# Patient Record
Sex: Male | Born: 1970
Health system: Southern US, Community
[De-identification: ages and names within clinical notes are randomized; demographics above are authoritative.]

## PROBLEM LIST (undated history)

## (undated) DIAGNOSIS — F419 Anxiety disorder, unspecified: Secondary | ICD-10-CM

## (undated) DIAGNOSIS — I493 Ventricular premature depolarization: Secondary | ICD-10-CM

## (undated) DIAGNOSIS — G8220 Paraplegia, unspecified: Secondary | ICD-10-CM

## (undated) DIAGNOSIS — T8859XA Other complications of anesthesia, initial encounter: Secondary | ICD-10-CM

## (undated) DIAGNOSIS — M869 Osteomyelitis, unspecified: Secondary | ICD-10-CM

## (undated) DIAGNOSIS — A419 Sepsis, unspecified organism: Secondary | ICD-10-CM

## (undated) DIAGNOSIS — R7881 Bacteremia: Secondary | ICD-10-CM

## (undated) DIAGNOSIS — I10 Essential (primary) hypertension: Secondary | ICD-10-CM

## (undated) DIAGNOSIS — Z978 Presence of other specified devices: Secondary | ICD-10-CM

## (undated) DIAGNOSIS — I059 Rheumatic mitral valve disease, unspecified: Secondary | ICD-10-CM

## (undated) DIAGNOSIS — I330 Acute and subacute infective endocarditis: Secondary | ICD-10-CM

## (undated) DIAGNOSIS — Z993 Dependence on wheelchair: Secondary | ICD-10-CM

## (undated) DIAGNOSIS — I499 Cardiac arrhythmia, unspecified: Secondary | ICD-10-CM

## (undated) DIAGNOSIS — R6521 Severe sepsis with septic shock: Secondary | ICD-10-CM

## (undated) DIAGNOSIS — Z960 Presence of urogenital implants: Secondary | ICD-10-CM

## (undated) HISTORY — PX: HIP SURGERY: SHX245

## (undated) HISTORY — DX: Dependence on wheelchair: Z99.3

## (undated) HISTORY — DX: Presence of urogenital implants: Z96.0

## (undated) HISTORY — DX: Presence of other specified devices: Z97.8

## (undated) HISTORY — DX: Ventricular premature depolarization: I49.3

## (undated) HISTORY — PX: BACK SURGERY: SHX140

---

## 1997-09-14 ENCOUNTER — Emergency Department (HOSPITAL_COMMUNITY): Admission: EM | Admit: 1997-09-14 | Discharge: 1997-09-14 | Payer: Self-pay | Admitting: Emergency Medicine

## 1998-10-03 ENCOUNTER — Encounter: Admission: RE | Admit: 1998-10-03 | Discharge: 1998-10-03 | Payer: Self-pay | Admitting: Family Medicine

## 1998-10-17 ENCOUNTER — Encounter: Admission: RE | Admit: 1998-10-17 | Discharge: 1998-10-17 | Payer: Self-pay | Admitting: Family Medicine

## 1999-03-10 ENCOUNTER — Emergency Department (HOSPITAL_COMMUNITY): Admission: EM | Admit: 1999-03-10 | Discharge: 1999-03-10 | Payer: Self-pay | Admitting: Emergency Medicine

## 1999-03-10 ENCOUNTER — Encounter: Payer: Self-pay | Admitting: Emergency Medicine

## 1999-03-12 ENCOUNTER — Encounter: Admission: RE | Admit: 1999-03-12 | Discharge: 1999-03-12 | Payer: Self-pay | Admitting: Sports Medicine

## 1999-04-14 ENCOUNTER — Emergency Department (HOSPITAL_COMMUNITY): Admission: EM | Admit: 1999-04-14 | Discharge: 1999-04-14 | Payer: Self-pay | Admitting: Emergency Medicine

## 2000-04-20 ENCOUNTER — Encounter: Admission: RE | Admit: 2000-04-20 | Discharge: 2000-04-20 | Payer: Self-pay | Admitting: Family Medicine

## 2002-09-27 ENCOUNTER — Emergency Department (HOSPITAL_COMMUNITY): Admission: EM | Admit: 2002-09-27 | Discharge: 2002-09-27 | Payer: Self-pay | Admitting: Emergency Medicine

## 2002-10-03 ENCOUNTER — Encounter: Admission: RE | Admit: 2002-10-03 | Discharge: 2002-10-03 | Payer: Self-pay | Admitting: Sports Medicine

## 2002-10-03 ENCOUNTER — Encounter: Payer: Self-pay | Admitting: Sports Medicine

## 2002-10-03 ENCOUNTER — Encounter: Admission: RE | Admit: 2002-10-03 | Discharge: 2002-10-03 | Payer: Self-pay | Admitting: Family Medicine

## 2003-09-04 ENCOUNTER — Inpatient Hospital Stay (HOSPITAL_COMMUNITY): Admission: AD | Admit: 2003-09-04 | Discharge: 2003-09-11 | Payer: Self-pay | Admitting: Family Medicine

## 2003-09-04 ENCOUNTER — Emergency Department (HOSPITAL_COMMUNITY): Admission: EM | Admit: 2003-09-04 | Discharge: 2003-09-04 | Payer: Self-pay | Admitting: Family Medicine

## 2003-10-31 ENCOUNTER — Ambulatory Visit (HOSPITAL_BASED_OUTPATIENT_CLINIC_OR_DEPARTMENT_OTHER): Admission: RE | Admit: 2003-10-31 | Discharge: 2003-10-31 | Payer: Self-pay | Admitting: Orthopedic Surgery

## 2004-08-23 ENCOUNTER — Emergency Department (HOSPITAL_COMMUNITY): Admission: EM | Admit: 2004-08-23 | Discharge: 2004-08-23 | Payer: Self-pay | Admitting: Family Medicine

## 2005-03-08 ENCOUNTER — Ambulatory Visit: Payer: Self-pay | Admitting: Family Medicine

## 2005-03-08 ENCOUNTER — Inpatient Hospital Stay (HOSPITAL_COMMUNITY): Admission: EM | Admit: 2005-03-08 | Discharge: 2005-03-13 | Payer: Self-pay | Admitting: Family Medicine

## 2005-03-14 ENCOUNTER — Ambulatory Visit (HOSPITAL_COMMUNITY): Admission: RE | Admit: 2005-03-14 | Discharge: 2005-03-15 | Payer: Self-pay | Admitting: Urology

## 2005-03-27 ENCOUNTER — Ambulatory Visit: Payer: Self-pay | Admitting: Family Medicine

## 2005-04-03 ENCOUNTER — Ambulatory Visit: Payer: Self-pay | Admitting: Family Medicine

## 2005-05-02 ENCOUNTER — Emergency Department (HOSPITAL_COMMUNITY): Admission: EM | Admit: 2005-05-02 | Discharge: 2005-05-03 | Payer: Self-pay | Admitting: Emergency Medicine

## 2005-11-29 ENCOUNTER — Emergency Department (HOSPITAL_COMMUNITY): Admission: EM | Admit: 2005-11-29 | Discharge: 2005-11-29 | Payer: Self-pay | Admitting: Emergency Medicine

## 2006-03-19 DIAGNOSIS — L8944 Pressure ulcer of contiguous site of back, buttock and hip, stage 4: Secondary | ICD-10-CM | POA: Insufficient documentation

## 2006-03-19 DIAGNOSIS — F528 Other sexual dysfunction not due to a substance or known physiological condition: Secondary | ICD-10-CM

## 2006-03-19 DIAGNOSIS — L8990 Pressure ulcer of unspecified site, unspecified stage: Secondary | ICD-10-CM

## 2006-03-19 DIAGNOSIS — I872 Venous insufficiency (chronic) (peripheral): Secondary | ICD-10-CM | POA: Insufficient documentation

## 2006-03-19 DIAGNOSIS — F419 Anxiety disorder, unspecified: Secondary | ICD-10-CM | POA: Insufficient documentation

## 2006-03-19 DIAGNOSIS — F411 Generalized anxiety disorder: Secondary | ICD-10-CM

## 2007-02-11 ENCOUNTER — Encounter (INDEPENDENT_AMBULATORY_CARE_PROVIDER_SITE_OTHER): Payer: Self-pay | Admitting: *Deleted

## 2007-05-08 ENCOUNTER — Emergency Department (HOSPITAL_COMMUNITY): Admission: EM | Admit: 2007-05-08 | Discharge: 2007-05-08 | Payer: Self-pay | Admitting: Emergency Medicine

## 2007-06-01 ENCOUNTER — Ambulatory Visit (HOSPITAL_COMMUNITY): Admission: RE | Admit: 2007-06-01 | Discharge: 2007-06-01 | Payer: Self-pay | Admitting: Family Medicine

## 2007-06-01 ENCOUNTER — Ambulatory Visit: Payer: Self-pay | Admitting: Family Medicine

## 2007-06-01 ENCOUNTER — Encounter (INDEPENDENT_AMBULATORY_CARE_PROVIDER_SITE_OTHER): Payer: Self-pay | Admitting: *Deleted

## 2007-06-01 DIAGNOSIS — R0602 Shortness of breath: Secondary | ICD-10-CM

## 2007-06-01 DIAGNOSIS — I10 Essential (primary) hypertension: Secondary | ICD-10-CM

## 2007-06-01 DIAGNOSIS — J309 Allergic rhinitis, unspecified: Secondary | ICD-10-CM | POA: Insufficient documentation

## 2007-06-01 LAB — CONVERTED CEMR LAB
AST: 10 units/L (ref 0–37)
Alkaline Phosphatase: 70 units/L (ref 39–117)
BUN: 11 mg/dL (ref 6–23)
Chloride: 107 meq/L (ref 96–112)
HCT: 39.4 % (ref 39.0–52.0)
LDL Cholesterol: 89 mg/dL (ref 0–99)
MCV: 84.7 fL (ref 78.0–100.0)
Potassium: 3.9 meq/L (ref 3.5–5.3)
Sodium: 145 meq/L (ref 135–145)
TSH: 0.704 microintl units/mL (ref 0.350–5.50)
Total Bilirubin: 0.4 mg/dL (ref 0.3–1.2)
Total Protein: 8 g/dL (ref 6.0–8.3)
Triglycerides: 134 mg/dL (ref <150)
VLDL: 27 mg/dL (ref 0–40)
WBC: 6.9 10*3/uL (ref 4.0–10.5)

## 2007-06-03 ENCOUNTER — Encounter (INDEPENDENT_AMBULATORY_CARE_PROVIDER_SITE_OTHER): Payer: Self-pay | Admitting: *Deleted

## 2007-06-04 ENCOUNTER — Encounter (INDEPENDENT_AMBULATORY_CARE_PROVIDER_SITE_OTHER): Payer: Self-pay | Admitting: *Deleted

## 2007-06-23 ENCOUNTER — Encounter (INDEPENDENT_AMBULATORY_CARE_PROVIDER_SITE_OTHER): Payer: Self-pay | Admitting: *Deleted

## 2007-06-30 ENCOUNTER — Ambulatory Visit: Payer: Self-pay | Admitting: Family Medicine

## 2007-06-30 ENCOUNTER — Encounter (INDEPENDENT_AMBULATORY_CARE_PROVIDER_SITE_OTHER): Payer: Self-pay | Admitting: *Deleted

## 2007-06-30 DIAGNOSIS — J45909 Unspecified asthma, uncomplicated: Secondary | ICD-10-CM | POA: Insufficient documentation

## 2007-06-30 LAB — CONVERTED CEMR LAB
BUN: 10 mg/dL (ref 6–23)
CO2: 26 meq/L (ref 19–32)
Calcium: 10.1 mg/dL (ref 8.4–10.5)
Chloride: 99 meq/L (ref 96–112)
Sodium: 141 meq/L (ref 135–145)

## 2007-07-01 ENCOUNTER — Encounter (INDEPENDENT_AMBULATORY_CARE_PROVIDER_SITE_OTHER): Payer: Self-pay | Admitting: *Deleted

## 2009-09-18 ENCOUNTER — Encounter: Payer: Self-pay | Admitting: Family Medicine

## 2010-02-19 NOTE — Miscellaneous (Signed)
Summary: Asthma QI    

## 2010-06-07 NOTE — Discharge Summary (Signed)
NAME:  Anthony [name], Anthony [name]                          ACCOUNT NO.:  [id number]   MEDICAL RECORD NO.:  [id number]                   PATIENT TYPE:  INP   LOCATION:  5743                                 FACILITY:  [facility]   PHYSICIAN:  Santiago Bumpers. Hensel, M.D.             DATE OF BIRTH:  Jun 25, 1970   DATE OF ADMISSION:  09/04/2003  DATE OF DISCHARGE:  09/11/2003                                 DISCHARGE SUMMARY   ADMITTING DIAGNOSES:  Osteomyelitis of the fourth and fifth right foot toes  with necrotic fifth toe.   DISCHARGE DIAGNOSES:  Right foot fourth and fifth toe osteomyelitis.   CLINICAL COURSE:  A 40 year old man with history of spina bifida and  paraplegia secondary to the same.  Reported noticing that his right small  toe turned black this weekend.  Patient has numerous traumatic injuries to  bilateral feet and toes including having his toes cut in his wheelchair.  He  has no numerous foot sores, especially between his toes in the past year.  On physical examination on the right foot necrotic fifth digit actually  hanging off and a stage 3 decubitus ulcer anterior to his lateral malleolus  was observed.  On his left foot there was necrosis of his fifth digit which  appeared to arise between the fourth and fifth digit.   LABORATORY DATA:  On the foot x-ray diagnosis of osteomyelitis of the  phalanges of the fifth toe and osteomyelitis of the fourth toe on the left  foot.  White blood cells 5.9, HB 10.9, hematocrit 32.4, platelets 432.  Differential:  N 64%, L 21%, M 12%, E 2%, B 1%.  BMP:  Na 138, K 3.3, Cl  103, CO2 28, BUN 6, CR 0.7, GLU 89.  Blood cultures were pending.  Because  of history of methicillin-resistant Staphylococcus aureus and his  sensitivity to vancomycin, patient was covered with Ancef and Doxycycline  for skin and for MRSA organisms.  A consult with orthopedics and a consult  with wound care was arranged.  Dr. Sherlean Foot, orthopedics, was consulted and  osteomyelitis  with necrosis of the fifth toe right foot was diagnosed.  Patient was scheduled for surgery on August 17.  According with Dr. Sherlean Foot,  the diagnosis was right fourth and fifth toe with osteomyelitis.   PROCEDURE:  Right fourth and fifth toe amputation.  A vascular consult was  requested for ABI status.  __________  indicate wet to dry dressings t.i.d.  Previous antibiotics were discontinued and Vancomycin and Rocephin were  added.  Wound healing was appropriate with good granulation tissue and no  purulent secretions.  According when Dr. Valere Dross was added to the treatment  plan for the wound on the right foot.  Lower extremity arterial evaluation  results:  ABIs and __________ wave forms were normal bilaterally.  According  to orthopedics, continue VAC as treatment for wounds.  Plans for [facility] wound  VAC Prafo bilaterally to protect feet.  Patient will follow up at [facility]  Ortho with Dr. [name].  IV antibiotics were discontinued on August 21 and  Augmentin 875 mg b.i.d. was added to the treatment.  Patient's right foot is  healing properly and on August 22 discharge home is decided.   PROCEDURES:  1. Lower extremity arterial evaluation.  2. Right and left foot x-rays.   CONSULTS:  1. Orthopedics, Dr. Sherlean Foot  2. Vascular.   Treatment as inpatient vancomycin 1 mg IV daily.  Given four days.  Rocephin  given three days.  This medication was discontinued and patient received  Augmentin 875 mg p.o. b.i.d. given two days.   DISCHARGE MEDICATIONS:  Augmentin 875 mg b.i.d. for three weeks.   WOUND CARE:  [facility] will visit the patient on Mondays,  Wednesdays, and Fridays.  Social worker in [facility] will try to  fix transportation for follow-up appointments with Dr. [name].   FOLLOWUP:  Follow-up appointment with Dr. [name] at [address]  and this is the [facility].  This will appear in the addendum.  __________ appointment is still pending.   Follow-up appointment with Dr.  Macon Large at [facility] still pending.  I will put this day  and date in the addendum.      Henri Medal, MD                   Santiago Bumpers Leveda Anna, M.D.    Lendon Colonel  D:  09/11/2003  T:  09/12/2003  Job:  [id number]

## 2010-06-07 NOTE — H&P (Signed)
NAME:  Anthony [name], Anthony [name] NO.:  [id number]   MEDICAL RECORD NO.:  [id number]          PATIENT TYPE:  OBV   LOCATION:  5033                         FACILITY:  [facility]   PHYSICIAN:  Leighton Roach McDiarmid, M.D.DATE OF BIRTH:  1971/01/10   DATE OF ADMISSION:  03/07/2005  DATE OF DISCHARGE:                                HISTORY & PHYSICAL   CHIEF COMPLAINT:  Foot infection and urine problems.   HISTORY OF PRESENT ILLNESS:  Mr. Rhines is a 40 year old white male with  paraplegia secondary to spina bifida who presented to the ED with a 1-2 week  history of a swollen right foot.  The foot has been swollen and erythematous  since he removed his cast about 1-2 weeks ago.  It has become increasingly  edematous and fluctuant since that time.  The patient also had noticed a  pocket of fluid on his perineum that has been there for about one month that  causes him to expel urine when he pushes on it.  The patient also denies any  burning or urinary discomfort.  To urinate, the patient needs to push  externally on his bladder.  The patient denies any fevers, but he has had  chills over the past week or two.  He also has had multiple ulcers on his  foot that have been there for many years.  He denies nausea or vomiting,  cough, chest pain, shortness of breath.   PAST MEDICAL HISTORY:  1.  Spina bifida with resulting paraplegia.  2.  Wolfe-Parkinson-White syndrome.  3.  History of decubitus ulcers.  4.  History of amputation of his right lateral two toes after a MRSA      infection in 2005.  5.  History of hypertension.   MEDICATIONS:  None.   ALLERGIES:  VANCOMYCIN causes severe itching.   PAST SURGICAL HISTORY:  The patient has had multiple operations on his lower  extremities to straighten them out.  These operations were done when he was  a child.   FAMILY HISTORY:  The patient's mother is alive, she has diabetes and lung  cancer.  His father is deceased and had an MI.  He has  a healthy sister.   SOCIAL HISTORY:  He lives alone in an apartment.  He is on disability.  He  gets Medicare.  He has smoked off and on for 15 years, most recently has  quit for the past 2-3 months.  When he was smoking, he smoked 1/2 to a pack  a day.  He has occasional alcohol, about 2-3 times a month when with  friends.   PHYSICAL EXAMINATION:  VITAL SIGNS:  Temperature 98.8, blood pressure 158/95, pulse 128 to 140,  respirations 22, oxygen 97-100% on room air.  GENERAL:  The patient is an alert and oriented, very pleasant male.  HEENT:  Head is normocephalic, atraumatic, pupils equal, round, reactive to  light and accommodation, moist mucous membranes, oropharynx clear without  erythema or exudate.  LUNGS:  Clear to auscultation bilaterally.  HEART:  Tachycardic, no murmurs, gallops, and rubs, regular rhythm.  ABDOMEN:  Normal active bowel sounds, soft, nontender, nondistended.  GU:  No penile ulcerations, circumcised male.  RECTAL:  The patient is trace heme positive, no fissures.  In the perineal  area, there is a pocket of swelling that is about 2 by 2 cm.  When pushed,  he expels urine.  EXTREMITIES:  The patient has mild contractures.  His right dorsal foot has  a scabbed ulcer that is surrounded by an area of erythema, warmth, and  edema, that extends almost up to his knee.  SKIN:  He has multiple healing ulcers bilaterally on both feet.  NEUROLOGICAL:  The patient does not have any sensation or motor control  below his waist.  He uses a wheelchair.  Cranial nerves grossly intact.  Normal strength, tone, and sensation in the upper extremities.   LABORATORY DATA:  UA had large leukocyte esterase, large nitrites, greater  than 300 protein, large hemoglobin, few bacteria, too numerous to count  white blood cells.  White blood cell count 12.3, hemoglobin 11, hematocrit  33.6, platelets 628 with 87% neutrophils.  Hemoglobin trace positive.  Sodium 138, potassium 3.9, chloride  105, bicarb 10, BUN 8, creatinine 0.9,  glucose 101.  AST 14, ALT 14, alkaline phos 78, bilirubin 0.7, albumin 2.7.  Wound culture pending.  Urine culture pending.   ASSESSMENT AND PLAN:  This is a 40 year old male with spina bifida here with  a right lower extremity cellulitis and a possible uroma.   Problem 1:  Cellulitis is likely secondary to venous stasis.  We will start  him on Bactrim 2 tablets b.i.d. to cover MRSA and other common skin  pathogens.  We will check blood cultures.  Currently, there are no signs of  systemic infection.  Problem 2:  Bacteruria.  The patient is asymptomatic but he does have a  urogenic bladder.  We will culture the urine and the Bactrim should cover E.  coli.  Problem 3:  Uroma.  We will consult urology for recommendation.  Problem 4:  Wound care.  We will have him to place Urea skin to his skin  lesions.  The patient may need a wound consult if available.  Problem 5:  Tachycardia.  We will get an EKG, we suspect this may be related  to his Wolfe-Parkinson-White or possibly infection or a combination of the  two.  Problem 6:  Fluids, electrolytes, and nutrition.  We will give him fluids  gently at half normal saline at 75 mL an hour and a regular diet.      Altamese Cabal, M.D.    ______________________________  Leighton Roach McDiarmid, M.D.    KS/MEDQ  D:  03/07/2005  T:  03/08/2005  Job:  [id number]

## 2010-06-07 NOTE — Discharge Summary (Signed)
NAME:  Anthony [name], Anthony [name]                          ACCOUNT NO.:  [id number]   MEDICAL RECORD NO.:  [id number]                   PATIENT TYPE:  INP   LOCATION:  5743                                 FACILITY:  [facility]   PHYSICIAN:  Henri Medal, MD             DATE OF BIRTH:  September 21, 1970   DATE OF ADMISSION:  09/04/2003  DATE OF DISCHARGE:  09/11/2003                                 DISCHARGE SUMMARY   ADDENDUM TO DISCHARGE SUMMARY:   FOLLOWUP:  1. The patient has a follow up appointment with Dr. Anastasio Auerbach on August     30 at 1:30 p.m.  2. The patient has an appointment with Dr. [name] at [facility] and     the center is going to call the patient to set the appointment for next     week.                                                Henri Medal, MD    FIM/MEDQ  D:  09/11/2003  T:  09/12/2003  Job:  [id number]   cc:   Anastasio Auerbach, MD  Fax: [phone]   [name], M.D.  Fax: [phone]

## 2010-06-07 NOTE — Op Note (Signed)
NAMELAVANTE, TOSO NO.:  [id number]   MEDICAL RECORD NO.:  [id number]          PATIENT TYPE:  INP   LOCATION:  5743                         FACILITY:  [facility]   PHYSICIAN:  Mila Homer. Sherlean Foot, M.D. DATE OF BIRTH:  November 25, 1970   DATE OF PROCEDURE:  09/06/2003  DATE OF DISCHARGE:  09/11/2003                                 OPERATIVE REPORT   SURGEON:  Mila Homer. Sherlean Foot, M.D.   ASSISTANT:  None.   ANESTHESIA:  General.   PREOPERATIVE DIAGNOSIS:  Osteomyelitis of the fourth and fifth toes.   POSTOPERATIVE DIAGNOSIS:  Osteomyelitis of the fourth and fifth toes.   PROCEDURE:  Right fourth and fifth ray amputations.   INDICATION FOR PROCEDURE:  The patient is a 40 year old spina bifida patient  I was consulted on, September 05, 2003, with a diagnosis of osteomyelitis,  confirmed by MRI scan.  Informed consent was obtained.   DESCRIPTION OF PROCEDURE:  The patient was laid supine and administered  regional block anesthesia.  The right lower extremity was prepped and draped  in the usual sterile fashion.  A #10 blade was used to make a ray  dissection, removing all necrotic tissue on the lateral border of the foot  and extending up, incorporating the lateral 2 toes.  The 2 toes were removed  sharply.  I then used a micro E saw to make a chamfer cut in the metatarsals  buried deep in the soft tissue.  I then irrigated with 6000 mL of saline and  dressed with a Kerlix moist and then a dry Kerlix, and then a Quincy Simmonds  dressing.  There was no tourniquet.  Hemostasis was easily obtained, since  the blood flow was compromised.   COMPLICATIONS:  None.   DRAINS:  None.       SDL/MEDQ  D:  10/30/2003  T:  10/31/2003  Job:  [id number]

## 2010-06-07 NOTE — Consult Note (Signed)
Anthony [name], Anthony [name]                ACCOUNT NO.:  [id number]   MEDICAL RECORD NO.:  [id number]          PATIENT TYPE:  OBV   LOCATION:  5033                         FACILITY:  [facility]   PHYSICIAN:  Lindaann Slough, M.D.  DATE OF BIRTH:  [birth year]   DATE OF CONSULTATION:  03/08/2005  DATE OF DISCHARGE:                                   CONSULTATION   REASON FOR CONSULTATION:  Swelling perineal area.   HISTORY OF PRESENT ILLNESS:  The patient is a 40 year old male paraplegic  secondary to spina bifida. He came to the emergency room last night with 1  to 2 weeks history of swelling of the right foot. He also has some swelling  in the perineal area that he noticed about a month ago and when he pushes on  that swelling, it drains to his penis. He does not have any discomfort and  he does not have any pain. He has urgency but he is able to void on his own.  He has no hematuria.   PAST MEDICAL HISTORY:  Positive for Wolff-Parkinson-White syndrome. He also  has a history of spina bifida.   PAST SURGICAL HISTORY:  He had hip surgery, back surgery, and surgery for  decubitus.   ALLERGIES:  VANCOMYCIN.   SOCIAL HISTORY:  He is single. He is not sexually active. He does not have  any erections. He does not smoke and drinks occasionally.   FAMILY HISTORY:  His father died in 76 of heart disease and there is a  family history of heart disease on his father side. His mother had breast  cancer and there is a maternal family history of diabetes. He has 1 sister.   REVIEW OF SYSTEMS:  He complains of swelling of the perineal area and  swelling of the right foot and he has no difficulty moving his bowels and he  has no nausea and vomiting. He has no cough and he has no other symptoms.   PHYSICAL EXAMINATION:  GENERAL:  This is a 40 year old paraplegic who is  oriented to person, place, and time.  VITAL SIGNS:  Blood pressure 112/68, temperature 101.2 last night and is  98.5 this morning.  Pulse 105. Respiratory rate 20.  SKIN:  Warm and dry.  HEENT:  He has pink conjunctivae.  NECK:  No cervical adenopathy. No thyromegaly.  LUNGS:  Clear.  HEART:  Regular rhythm.  ABDOMEN:  Soft, nontender, and nondistended. No CVA tenderness. No  hepatosplenomegaly.  No inguinal hernia.  GENITOURINARY:  Penis is uncircumcised. Meatus is normal. Scrotum is normal.  He has no hydrocele and no testicular mass. There is a fluctuant non-tender  area of the perineum. On pressing on that area, urine comes out of the  urethra.  RECTUM:  He has no external hemorrhoids and sphincter tone is normal. There  is no rectal mass and he has stools in ampulla.  EXTREMITIES:  He has mild contractures of both feet and he has healing  ulcers on both feet.   LABORATORY DATA:  Urinalysis shows too numerous to count white blood cells  and 21  to 50 red blood cells with pH of 6.30 and urine is nitrite positive.  Hemoglobin is 9.3, hematocrit 27.9. White blood cell count 9.6. Sodium 135,  potassium 3.5, BUN 7, creatinine 0.8.   IMPRESSION:  1.  Possible urethral diverticulum.  2.  Paraplegia.  3.  Spina bifida.  4.  Contractures lower extremities.   SUGGESTIONS:  Retrograde urethrogram to rule out urethral diverticulum.  Further evaluation and management depends on the results of the urethrogram.      Lindaann Slough, M.D.  Electronically Signed     MN/MEDQ  D:  03/08/2005  T:  03/08/2005  Job:  [id number]

## 2010-06-07 NOTE — H&P (Signed)
NAME:  Anthony [name], Anthony [name]                          ACCOUNT NO.:  [id number]   MEDICAL RECORD NO.:  [id number]                   PATIENT TYPE:  INP   LOCATION:  5727                                 FACILITY:  [facility]   PHYSICIAN:  Santiago Bumpers. Hensel, M.D.             DATE OF BIRTH:  1970-01-22   DATE OF ADMISSION:  09/04/2003  DATE OF DISCHARGE:                                HISTORY & PHYSICAL   PRIMARY CARE PHYSICIAN:  Dr. Anastasio Auerbach   CHIEF COMPLAINT:  Total necrosis of foot ulcers.   HISTORY OF PRESENT ILLNESS:  The patient is a 40 year old man with a history  of spina bifida and paraplegia secondary to same.  He reports noticing his  right small toe turning black this weekend.  He has had numerous traumatic  injuries to bilateral feet and toes, including having his toes caught in his  wheelchair, also a plastic bag which became wrapped around his small toe and  pulling it back.  He has had numerous foot sores especially between his toes  in the past year.   REVIEW OF SYSTEMS:  CONSTITUTIONAL:  Positive for a temperature to 100.3  with no chills.  CARDIOVASCULAR:  No palpitations.  RESPIRATORY:  No  shortness of breath.  GI:  No nausea, vomiting, diarrhea, constipation.  SKIN:  As in HPI, skin  ulcers on bilateral feet.  PSYCHE:  Positive for anxiety.  NEUROLOGIC:  Positive for paraplegia.  GU:  No dysuria or other urinary symptoms.  HEMATOLOGY:  No easy bruising or  bleeding.   PAST MEDICAL HISTORY:  1. Spina bifida with paraplegia secondary to same.  2. Decubitus ulcers.  3. History of methicillin resistant Staphylococcus aureus.  4. Wolfe-Parkinson White syndrome.   MEDICATIONS:  None.   ALLERGIES:  He reports having a severe pruritic reaction to either  ERYTHROMYCIN OR VANCOMYCIN, he is unsure of which.   PROCEDURE:  1. History of multiple hip surgeries as a child.  2. Left lower extremity surgery on a tendon.  3. Multiple surgeries for decubitus ulcers.   SOCIAL  HISTORY:  He lives alone in an apartment.  Quit smoking in October of  2004, however he does partake in an occasional cigarette, i.e.,  approximately 1 every couple of months.  He does drink alcohol approximately  3-4 drinks every 2 weeks, and denies illicit drugs.  He is on Social  Security disability.   PHYSICAL EXAMINATION:  VITAL SIGNS:  Temperature 99.4, pulse 119,  respirations 17, blood pressure 155/105.  GENERAL:  This is an unkempt white male in no acute distress.  HEENT:  Pupils are equal, round and reactive to light.  Extraocular  movements are intact.  Oropharynx is without erythema or exudate but he does have poor dentition  noted.  NECK:  No thyromegaly, no lymphadenopathy.  LUNGS:  Clear to auscultation bilaterally with good air movement.  CARDIOVASCULAR:  He is mildly tachycardic  without murmur.  He has 2+ distal  pulses in bilateral feet.  EXTREMITIES:  He does have gross edema of the right foot and 1+ pitting  edema to mid tibia.  He has some edema of his left foot but no tibial edema  noted.  ABDOMEN:  Soft, nontender, nondistended with normal active bowel sounds.  SKIN:  His right foot has a necrotic fifth digit which is actually hanging  off, and a stage 3 decubitus ulcer just anterior to his lateral malleolus.  His left foot has necrosis of the fifth digit which appears to arise from  between the fourth and fifth digit.  NEUROLOGIC:  Cranial nerves II-XII are grossly intact.  Strength is 5/5  bilaterally. He has 2+ deep tendon reflexes in upper extremities and  diminished DTRs in his lower extremities secondary to paraplegia.  </LABORATORY DATA/TESTS>  He has foot x-rays, a CBC with differential, and blood cultures pending.   ASSESSMENT/PLAN:  1. Multiple foot ulcers and necrotic toes, this seems likely secondary to     multiple traumatic events.  We will check blood cultures and CBC and     obtain x-rays to rule out osteomyelitis.  Will also consult  orthopedics     in the morning to perform what likely will need to be an amputation.     Also concerned for cellulitis, he does has some erythema up his right     leg.  Therefore I will start antibiotics.  Because of his history of     methicillin resistant Staphylococcus aureus and his sensitivity to     vancomycin, we will cover with Ancef and Doxycycline for skin and for     MRSA organisms.  We will also obtain a wound care consult and arrange for     [facility].   1. Social.  The patient lives alone with what sounds like few services     available to him.  We will consult Care Management to assist with this     planning.   1. Paraplegia secondary to spina bifida.  Will consider a PT consult on     patient's medical condition and stabilize.   1. Anxiety.  Will monitor for signs and symptoms but work up as an     outpatient seems more appropriate for this gentleman.      Ursula Beath, MD                     Santiago Bumpers. Leveda Anna, M.D.    JT/MEDQ  D:  09/04/2003  T:  09/05/2003  Job:  [id number]

## 2010-06-07 NOTE — Op Note (Signed)
NAMEWAYMOND, MEADOR                ACCOUNT NO.:  [id number]   MEDICAL RECORD NO.:  [id number]          PATIENT TYPE:  INP   LOCATION:  5033                         FACILITY:  [facility]   PHYSICIAN:  Lindaann Slough, M.D.  DATE OF BIRTH:  [birth year]   DATE OF PROCEDURE:  03/11/2005  DATE OF DISCHARGE:                                 OPERATIVE REPORT   PREOPERATIVE DIAGNOSIS:  Urethral diverticulum.   POSTOPERATIVE DIAGNOSIS:  Urethral diverticulum.   OPERATION PERFORMED:  Cystoscopy.   SURGEON:  Danae Chen, M.D.   ANESTHESIA:   INDICATIONS FOR PROCEDURE:  The patient is a 40 years old male paraplegic  secondary to spina bifida.  He had noticed a swelling in the perineal area  and when he squeezes that area, urine comes out of the urethra.  Retrograde  urethrogram showed extravasation of contrast extending towards the hip.  He  is scheduled today for cystoscopy.   DESCRIPTION OF PROCEDURE:  After instillation of 2% Xylocaine jelly in the  urethra, a flexible cystoscope was passed in the bladder.  There is a wide  mouth diverticulum on the floor of the bulbous urethra and the rest of the  urethra is normal.  The bladder mucosa is normal.  There is no stone or  tumor in the bladder.  The ureteral orifices are in normal position and  shape.  The cystoscope was removed.   The patient tolerated the procedure well.      Lindaann Slough, M.D.  Electronically Signed     MN/MEDQ  D:  03/11/2005  T:  03/11/2005  Job:  [id number]

## 2010-06-07 NOTE — Discharge Summary (Signed)
NAMEKENRICK, PORE NO.:  [id number]   MEDICAL RECORD NO.:  [id number]          PATIENT TYPE:  INP   LOCATION:  5033                         FACILITY:  [facility]   PHYSICIAN:  Pearlean Brownie, M.D.DATE OF BIRTH:  1970-05-05   DATE OF ADMISSION:  03/07/2005  DATE OF DISCHARGE:  03/13/2005                                 DISCHARGE SUMMARY   DISCHARGE DIAGNOSES:  1.  Cellulitis.  2.  Urinary tract infection.  3.  Urethral diverticulum.  4.  Anemia.  5.  Anxiety disorder, not otherwise specified.  6.  Tinea pedis.   PROCEDURES:  1.  Cystoscopy on March 11, 2005 which demonstrated no urethral      strictures but a false passage in the bulbous urethra.  2.  Retrograde urethrogram which showed a large urethral      diverticulum/fistula extending into the left buttocks posterior to the      left hip as well as a small amount of vesicourethral reflux into the      distal right ureter.  3.  Arterial Dopplers of the lower extremities which were normal.   LABORATORY DATA ON ADMISSION:  UA had large blood, greater than 300 protein,  positive nitrites, and large leukocytes.  CBC:  White count 12.3, hemoglobin  11.1, hematocrit 33.6, MCV 74.8, platelets 629.  Sodium 138, potassium 3.9,  chloride 105, bicarb 28, glucose 101, BUN 8, creatinine 0.9.  Total  bilirubin 0.7, alkaline phosphatase 78, AST 14, ALT 14, albumin 2.7, total  protein 7.8.  Fecal occult blood-positive.   DISCHARGE MEDICATIONS:  1.  Bactrim DS p.o. b.i.d. x7 days.  2.  Lamisil 250 mg p.o. daily x8 days.  3.  Ambien 10 mg p.o. q.h.s. p.r.n.  4.  Urea cream 4%, apply b.i.d. x2 weeks.  5.  BuSpar 10 mg p.o. b.i.d.  6.  Multivitamin p.o. daily.  7.  Klonopin 0.5 mg t.i.d.   HISTORY OF PRESENT ILLNESS:  Mr. Pherigo is a 40 year old male with paraplegia  secondary to spina bifida who presented to the ED with a one to two-week  history of a swollen right foot.  He had multiple ulcers on both of his  feet.  He also had the complaint that he had an area of swelling on his  perineal area that when depressed he would extravasate urine from his penis.  The patient was admitted for treatment of cellulitis and treatment of his  urinary problem.   HOSPITAL COURSE BY PROBLEM:  Problem 1.  Cellulitis.  The patient was  started on IV clindamycin and Septra and began to respond immediately.  The  culture was Staph aureus and found to be sensitive to Bactrim, so he was  switched over to this on hospital day #3 and discharged home on this for a  total of 10 days of treatment.  He remained afebrile for the rest of his  hospitalization, and his white count trended down.   Problem 2.  Urinary tract infection.  The patient had an E coli infection  that was also sensitive to Bactrim.  He will be  treated for a total of 10  days.   Problem 3.  Anemia.  The patient had a microcytic anemia that was likely a  mixed picture of chronic disease and iron deficiency.  This will need to be  followed up on as an outpatient since he was fecal occult blood-positive.  He was started on a multivitamin while he was here since his folate was low.  He hemoglobin remained stable but above 10 throughout his hospitalization,  so no further intervention was necessary.   Problem 4.  Urethral diverticulum.  This was diagnosed by urology consult  who did a cystoscopy and retrograde urethrogram.  The patient was scheduled  to have surgery on the day after discharge at [facility].   Problem 5.  Anxiety.  The patient had a lot of anxiety here in the hospital,  but it also seems to have an issue for him at home.  He was started on  Klonopin as well as BuSpar and tolerated both of these well, and they seemed  to help.  His BuSpar can be titrated up as an outpatient.   Problem 6.  Tinea pedis.  The patient had thick plaques on his feet  bilaterally.  We gave him an oral antifungal as well as cream to use on his   feet.   Problem 7.  Physical therapy.  The patient needed a Roho cushion for his  wheelchair.  We were able to obtain this for him while he was here in the  hospital.  He was also fitted for a new wheelchair and will be getting one  through Medicare.   LABORATORY DATA ON DISCHARGE:  Blood cultures negative.  TSH 1.093.  Ferritin 220.  White count 8.5, hemoglobin 10.7, hematocrit 32.7, platelets  639.  Wound culture with Staph aureus.  Folate 3.4.  Vitamin B12 329.  Total  iron 35.   DISCHARGE INSTRUCTIONS:  1.  He is to take all of his medications as prescribed.  2.  He is to follow up at [facility] for urologic surgery.  3.  He is to follow up with Dr. Iven Finn on April 08, 2005.      Altamese Cabal, M.D.    ______________________________  Pearlean Brownie, M.D.    KS/MEDQ  D:  03/16/2005  T:  03/17/2005  Job:  [id number]

## 2010-06-07 NOTE — Op Note (Signed)
Anthony [name], Anthony [name]                ACCOUNT NO.:  [id number]   MEDICAL RECORD NO.:  [id number]          PATIENT TYPE:  OIB   LOCATION:  1410                         FACILITY:  [facility]   PHYSICIAN:  Lindaann Slough, M.D.  DATE OF BIRTH:  Jun 05, 1970   DATE OF PROCEDURE:  03/14/2005  DATE OF DISCHARGE:                                 OPERATIVE REPORT   PREOPERATIVE DIAGNOSIS:  Urethral diverticulum.   POSTOPERATIVE DIAGNOSIS:  Urethral diverticulum.   PROCEDURE:  Cystoscopy, cystogram and insertion of suprapubic catheter.   SURGEON:  Dr. Brunilda Payor and Dr. Sherron Monday.   ANESTHESIA:  General.   INDICATION:  The patient is a 40 year old male who was seen in consultation  about a week ago for a 1 month history of swelling of the perineum. When he  squeezes that swelling,  urine comes out of the urethra.  Retrograde  urethrogram showed a urethral diverticulum with extravasation of contrast  extending to the left hip. Cystoscopy showed no urethral stricture and a  diverticulum in the bulbous urethra. The patient is scheduled today for  cystoscopy under anesthesia, retrograde urethrogram, cystogram and  suprapubic cystostomy.   DESCRIPTION OF PROCEDURE:  Under general anesthesia, the patient was prepped  and draped and placed in the dorsal lithotomy position. A #22 Wappler  cystoscope was inserted in the urethra. There is no evidence of urethral  stricture but there is a wide-mouth diverticulum on the floor of the bulbous  urethra. The cystoscope was then passed through the prostatic urethra and  there is a diverticulum also in the prostatic urethra. The cystoscope was  passed in the bladder. There is no stone or tumor in the bladder. The right  ureteral orifice is of a horse shoe type. The left ureteral orifice is of  stadium shape.  Then a guidewire was passed through the cystoscope into the  bladder and the cystoscope was removed. A #18-French Councill tip catheter  was passed over the  guidewire and the guidewire was removed. A cystogram was  then done. The contours of the bladder are normal. There is no evidence of  bladder diverticulum. Then the Councill tip catheter was removed. The  bladder neck is competent. There is no evidence of drainage through the  bladder neck. The catheter was then removed. The cystoscope was reinserted  in the bladder and the bladder was then filled with normal saline and the  bladder capacity is about 700 mL. Then a spinal needle was passed through  the suprapubic area at about 2 inches above the symphysis pubis into the  bladder to verify the position of the bladder. Then a 1 cm transverse skin  incision was made in the suprapubic area about 2 inches above the symphysis  pubis and a Rush suprapubic catheter introducer was passed through that  incision into the bladder and a #16-French Foley catheter was passed through  the introducer into the bladder. The position of the Foley catheter in the  bladder was verified by the cystoscope. The balloon of the Foley catheter  was then inflated with 10 mL of normal saline and the  cystoscope was  removed. The catheter was then secured to the skin with #2-0 silk.   The patient tolerated the procedure well and left the OR in satisfactory  condition to post anesthesia care unit.   It is important to note that Dr. Sherron Monday  feels that it is not possible  to fix the urethral diverticulum and the patient needs urinary diversion  with suprapubic cystostomy.      Lindaann Slough, M.D.  Electronically Signed     MN/MEDQ  D:  03/14/2005  T:  03/15/2005  Job:  [id number]

## 2010-10-30 ENCOUNTER — Ambulatory Visit: Payer: Self-pay | Admitting: Family Medicine

## 2010-10-31 ENCOUNTER — Telehealth: Payer: Self-pay | Admitting: *Deleted

## 2010-10-31 NOTE — Telephone Encounter (Signed)
Patient scheduled for an appointment yesterday to re-establish care at [facility].  Missed his NP appointment and did not cancel 24 hours in advance.  Per policy is not allowed to reschedule.

## 2010-12-26 ENCOUNTER — Ambulatory Visit (INDEPENDENT_AMBULATORY_CARE_PROVIDER_SITE_OTHER): Payer: Self-pay | Admitting: Family Medicine

## 2010-12-26 ENCOUNTER — Encounter: Payer: Self-pay | Admitting: Family Medicine

## 2010-12-26 VITALS — BP 140/88 | HR 103 | Temp 99.0°F

## 2010-12-26 DIAGNOSIS — I10 Essential (primary) hypertension: Secondary | ICD-10-CM

## 2010-12-26 LAB — BASIC METABOLIC PANEL
Calcium: 9.3 mg/dL (ref 8.4–10.5)
Sodium: 144 mEq/L (ref 135–145)

## 2010-12-26 MED ORDER — LISINOPRIL-HYDROCHLOROTHIAZIDE 20-12.5 MG PO TABS: 1.0000 | ORAL_TABLET | Freq: Every day | ORAL | Status: DC

## 2010-12-26 NOTE — Progress Notes (Signed)
  Subjective:    Patient ID: Anthony [name], male    DOB: September 21, 1970, 40 y.o.   MRN: [id number]  HPI Pt has not been seen in several years.  Has not had BP med in at least 1 year.  Now he is having stress related HA and he would like to reestablish care.  These HA will go away quickly with advil.  No vision change with HA but does note that if he applies pressure to his eye, his vision will blur.  He has not been seen by an eye doctor for this.  Right now, he feels his vision is fine.     Review of Systems No CP, SOB or leg edema     Objective:   Physical Exam Vital signs reviewed General appearance - alert, well appearing, and in no distress and oriented to person, place, and time Heart - normal rate, regular rhythm, normal S1, S2, no murmurs, rubs, clicks or gallops Chest - clear to auscultation, no wheezes, rales or rhonchi, symmetric air entry, no tachypnea, retractions or cyanosis Abdomen - soft, nontender, nondistended, no masses or organomegaly Chronic indwelling catheter Wheelchair bound       Assessment & Plan:

## 2010-12-26 NOTE — Patient Instructions (Signed)
I am checking your blood work today and I would like you to come back for a nurse visit and lab check in about 1 week Please come back and see me in one mont for blood pressure follow up

## 2010-12-26 NOTE — Assessment & Plan Note (Signed)
Repeat BP better.  Restart meds.  Check BMET today and have pt follow up for BMET and RN BP check in 1 week

## 2011-01-06 ENCOUNTER — Other Ambulatory Visit: Payer: Self-pay

## 2011-01-08 ENCOUNTER — Ambulatory Visit (INDEPENDENT_AMBULATORY_CARE_PROVIDER_SITE_OTHER): Payer: Self-pay | Admitting: *Deleted

## 2011-01-08 ENCOUNTER — Encounter: Payer: Self-pay | Admitting: Family Medicine

## 2011-01-08 ENCOUNTER — Other Ambulatory Visit: Payer: Self-pay

## 2011-01-08 ENCOUNTER — Other Ambulatory Visit: Payer: Self-pay | Admitting: Family Medicine

## 2011-01-08 VITALS — BP 150/110 | HR 120

## 2011-01-08 DIAGNOSIS — I10 Essential (primary) hypertension: Secondary | ICD-10-CM

## 2011-01-08 LAB — BASIC METABOLIC PANEL
Chloride: 102 mEq/L (ref 96–112)
Creat: 0.62 mg/dL (ref 0.50–1.35)
Glucose, Bld: 92 mg/dL (ref 70–99)
Potassium: 4.2 mEq/L (ref 3.5–5.3)

## 2011-01-08 MED ORDER — LISINOPRIL-HYDROCHLOROTHIAZIDE 20-12.5 MG PO TABS: 2.0000 | ORAL_TABLET | Freq: Every day | ORAL | Status: DC

## 2011-01-08 NOTE — Progress Notes (Signed)
Bmp done today Anthony [name] 

## 2011-01-08 NOTE — Progress Notes (Signed)
Patient in earlier today for labs and BP check. BP checked manually using regular adult cuff.  BP LA 160/108 and RA 150/110 pulse 120. Patient states he is taking BP medication as recently prescribed.  He also brings in his BP monitor and is able to provide history on machine  of BP readings. dystolic BP consistently  anywhere from 90 to 118. Consulted with Dr. Jennette Kettle and she advises for patient to  double up on Llisinopril /HCTZ 20/12.5and follow up with MD in 2 weeks. Appointment scheduled .

## 2011-01-08 NOTE — Progress Notes (Signed)
Patient ID: Anthony [name], male   DOB: 22-Jul-1970, 40 y.o.   MRN: [id number] BP check ON 160/108 and review of BP at home similar w some more elevation diastolic. Will dbl his lisinoretic and he will f/u PCp 2-3 weeks. He had labs drawn today.

## 2011-01-27 ENCOUNTER — Ambulatory Visit (INDEPENDENT_AMBULATORY_CARE_PROVIDER_SITE_OTHER): Payer: Medicare Other | Admitting: Family Medicine

## 2011-01-27 ENCOUNTER — Encounter: Payer: Self-pay | Admitting: Family Medicine

## 2011-01-27 DIAGNOSIS — M25519 Pain in unspecified shoulder: Secondary | ICD-10-CM

## 2011-01-27 DIAGNOSIS — M25511 Pain in right shoulder: Secondary | ICD-10-CM | POA: Insufficient documentation

## 2011-01-27 DIAGNOSIS — I10 Essential (primary) hypertension: Secondary | ICD-10-CM | POA: Diagnosis not present

## 2011-01-27 DIAGNOSIS — M25512 Pain in left shoulder: Secondary | ICD-10-CM | POA: Insufficient documentation

## 2011-01-27 MED ORDER — LISINOPRIL-HYDROCHLOROTHIAZIDE 20-12.5 MG PO TABS: 2.0000 | ORAL_TABLET | Freq: Every day | ORAL | Status: DC

## 2011-01-27 MED ORDER — LISINOPRIL-HYDROCHLOROTHIAZIDE 20-25 MG PO TABS: 1.0000 | ORAL_TABLET | Freq: Every day | ORAL | Status: DC

## 2011-01-27 NOTE — Assessment & Plan Note (Signed)
Bilateral shoulder pain left greater than right. I think this may be related to either overuse or arthritis. Uses his shoulders a lot moving his wheelchair. Advised Tylenol for pain. Patient to return if worsened and wants physical therapy or further evaluation.

## 2011-01-27 NOTE — Assessment & Plan Note (Signed)
Blood pressure in range today. Continue current management.

## 2011-01-27 NOTE — Patient Instructions (Addendum)
Pick up the script for lisinopril/HCTZ at 20/12.5 and take 2 pills.  They should give you 60 pills.    Take your blood pressure at home Call if consistently it is over 140/90

## 2011-01-27 NOTE — Progress Notes (Signed)
  Subjective:    Patient ID: Anthony [name], male    DOB: 1970/06/01, 41 y.o.   MRN: [id number]  HPI Hypertension-patient's blood pressure at home has consistently been in the 120s over 70s. He denies any chest pain or shortness of breath. He denies any headaches. He states that initially when he started the medication he had some diarrhea but now it is improved. He states he will occasionally have a feeling of fatigue the last about 20 minutes and is associated with no other symptoms. It seems to come and go without warning. It usually happens a few times a week. He states that he does not have any residual weakness after this happens.  Shoulder pain-patient has shoulder pain bilaterally left greater than right. This is worse at the end of the day. It feels like an ache. He will usually take some Motrin for this and it will make it go away pretty quickly. It is not bad enough that he wants any physical therapy.   Review of Systems Denies CP, SOB, HA, N/V/D, fever     Objective:   Physical Exam  Vital signs reviewed General appearance - alert, well appearing, and in no distress and oriented to person, place, and time Heart - normal rate, regular rhythm, normal S1, S2, no murmurs, rubs, clicks or gallops Chest - clear to auscultation, no wheezes, rales or rhonchi, symmetric air entry, no tachypnea, retractions or cyanosis Shoulders-bilaterally with full range of motion, mild tenderness with raising shoulder above head on left. Crepitus bilaterally      Assessment & Plan:

## 2011-04-03 ENCOUNTER — Ambulatory Visit (INDEPENDENT_AMBULATORY_CARE_PROVIDER_SITE_OTHER): Payer: Medicare Other | Admitting: Family Medicine

## 2011-04-03 ENCOUNTER — Encounter: Payer: Self-pay | Admitting: Family Medicine

## 2011-04-03 VITALS — BP 112/78 | HR 122 | Temp 98.9°F

## 2011-04-03 DIAGNOSIS — I10 Essential (primary) hypertension: Secondary | ICD-10-CM

## 2011-04-03 NOTE — Progress Notes (Signed)
  Subjective:    Patient ID: Anthony [name], male    DOB: 1970/12/13, 41 y.o.   MRN: [id number]  HPI Patient presented today for followup of hypertension. He has been tracking his blood pressures 3 times a day at home. He brings his numbers in today. His numbers are between 140 and 170 systolic and 90 and 110 diastolic. He will occasionally get headaches and his blood pressure goes up to 170. He is taking one pill of lisinopril HCTZ. He denies chest pain or shortness of breath.   Review of Systems See above    Objective:   Physical Exam Vital signs reviewed General appearance - alert, well appearing, and in no distress and oriented to person, place, and time Heart - normal rate, regular rhythm, normal S1, S2, no murmurs, rubs, clicks or gallops Chest - clear to auscultation, no wheezes, rales or rhonchi, symmetric air entry, no tachypnea, retractions or cyanosis        Assessment & Plan:

## 2011-04-03 NOTE — Patient Instructions (Signed)
Please continue your current medication. Please bring in your blood pressure cuff to next appointment We will compare our readings next your readings. If your next in office blood pressure is good we will leave your medicine alone If your next in office blood pressure is bad, we will increase your medicine

## 2011-04-03 NOTE — Assessment & Plan Note (Signed)
BP Readings from Last 3 Encounters:  04/03/11 112/78  01/27/11 110/73  01/08/11 150/110   Patient was last several blood pressures normal. However, his readings at home involving very elevated. I need to figure out which ones are more accurate. If he is truly getting elevated at home, we'll need to increase his medications. He'll come back on Monday for reevaluation. He will bring his cuff and we will check them side by side at that time. He may need to be evaluated with ambulatory home blood pressure monitoring if his cuff is not accurate.

## 2011-04-07 ENCOUNTER — Encounter: Payer: Self-pay | Admitting: Family Medicine

## 2011-04-07 ENCOUNTER — Ambulatory Visit (INDEPENDENT_AMBULATORY_CARE_PROVIDER_SITE_OTHER): Payer: Medicare Other | Admitting: Family Medicine

## 2011-04-07 VITALS — BP 132/80 | HR 109 | Temp 99.0°F

## 2011-04-07 DIAGNOSIS — I10 Essential (primary) hypertension: Secondary | ICD-10-CM

## 2011-04-07 NOTE — Assessment & Plan Note (Signed)
Together we discussed his home tracking. He will not use his wrist monitor anymore. If she desires to get a home monitor, it will be for the upper arm. At that time, he will come in and have it checked with our machine. He will come in for 3 months once a month for a blood pressure check to make sure that it stays stable. After that he will come in every 3-6 months.

## 2011-04-07 NOTE — Patient Instructions (Signed)
Thank you for coming in today Please don't use your wrist cuff anymore You can get a arm cuff that goes around your upper arm if you would like Until you get 1, let's have you come in for nurse visit once a month for 3 months to make sure your blood pressure stays normal When you get one, please bring it in and we will check it against our machine as well

## 2011-04-07 NOTE — Progress Notes (Signed)
  Subjective:    Patient ID: Anthony [name], male    DOB: 1970/05/06, 41 y.o.   MRN: [id number]  HPI  Patient here today to followup on his blood pressure. He is brought in his wrist cuff. On exam today, his blood pressure is 132/80 in the left arm. In his left wrist on his machine it is 155/103. He denies chest pain, shortness of breath. He is taking his medications. He is not dizzy.  Review of Systems See above    Objective:   Physical Exam  Vital signs reviewed General appearance - alert, well appearing, and in no distress and oriented to person, place, and time       Assessment & Plan:

## 2011-12-08 ENCOUNTER — Telehealth: Payer: Self-pay | Admitting: Family Medicine

## 2011-12-08 NOTE — Telephone Encounter (Signed)
Is asking for a Roho cushion (air cushion) for his wheelchair - needs to go thru [facility]

## 2011-12-09 NOTE — Telephone Encounter (Signed)
I can take care of this but need some advice on how to put in an order for [facility] for a patient in outpatient setting.

## 2011-12-11 ENCOUNTER — Other Ambulatory Visit: Payer: Self-pay | Admitting: Family Medicine

## 2011-12-11 DIAGNOSIS — L8990 Pressure ulcer of unspecified site, unspecified stage: Secondary | ICD-10-CM

## 2011-12-11 NOTE — Assessment & Plan Note (Signed)
Ordering cushion. Will schedule follow up appointment.

## 2011-12-11 NOTE — Telephone Encounter (Signed)
Ordered cushion and will fax order to [facility].  Would like to see patient in clinic.  Please set up appointment. Thanks!

## 2011-12-11 NOTE — Progress Notes (Signed)
Patient with history of chronic venous insufficiency and decubitus ulcer requesting Roho cushion (air cushion) for his wheelchair through [facility].  Placing order (will fax to [facility]) and asking team to help schedule appointment to follow up with me.  Simone Curia 12/11/2011 10:04 AM

## 2011-12-12 NOTE — Telephone Encounter (Signed)
Dorene Grebe called back and I gave her the information needed to get the cushion.  Called and informed pt, attempted to get the pt an appt but he said he would call back to scheudle. Tiki Tucciarone, Maryjo Rochester

## 2011-12-12 NOTE — Telephone Encounter (Signed)
12/11/2011 4:26 PM Darci Needle [facility] Patient Calls Comment: [facility] called and said if patient doesn't have a decubitus on his buttocks or trunk - pt states that he only has a sore between his legs. Medicare will not pay for this cushion. Not sure what to do now. Of note: The above message is from yesterday.  I have called natalie @ [phone] ext [phone] and LMOVM for her to give me a call back.  Will inform her of decubitus ulcer on buttocks. When she returns call. Fleeger, Maryjo Rochester

## 2011-12-12 NOTE — Telephone Encounter (Signed)
Pt states that his sore is not on his leg, it is between the crack of his buttocks - very painful and needs the cushion asap.  pls let [facility] know to send it out - medicare will pay for it if the ulcer is on his trunk or buttocks

## 2012-02-23 ENCOUNTER — Other Ambulatory Visit: Payer: Self-pay | Admitting: *Deleted

## 2012-02-23 MED ORDER — LISINOPRIL-HYDROCHLOROTHIAZIDE 20-12.5 MG PO TABS: 1.0000 | ORAL_TABLET | Freq: Every day | ORAL | Status: DC

## 2012-02-23 NOTE — Telephone Encounter (Signed)
Patient needs appt with MD prior to more refills.

## 2012-02-26 ENCOUNTER — Telehealth: Payer: Self-pay | Admitting: Family Medicine

## 2012-02-26 DIAGNOSIS — I10 Essential (primary) hypertension: Secondary | ICD-10-CM

## 2012-02-26 NOTE — Telephone Encounter (Signed)
Patient states wrong

## 2012-02-26 NOTE — Telephone Encounter (Signed)
Spoke with patient and he states he has been on lisinopril 20/25 for the past year. Marland Kitchen Spoke with pharmacist and he confirmed that this is what he last received. Consulted Dr. Deirdre Priest . Chart was reviewed. He advises to send in lisinopril HCTZ 20/25.  Patient needs appointment for follow up with PCP. Message left on voicemail.

## 2012-02-26 NOTE — Telephone Encounter (Signed)
Patient states the wrong dosage for his BP meds was sent to pharmacy. Please call patient as soon as possible due the the patient being at the pharmacy and present moment.

## 2012-02-27 MED ORDER — LISINOPRIL-HYDROCHLOROTHIAZIDE 20-25 MG PO TABS: 1.0000 | ORAL_TABLET | Freq: Every day | ORAL | Status: DC

## 2012-03-10 ENCOUNTER — Ambulatory Visit: Payer: Medicare Other | Admitting: Family Medicine

## 2012-03-22 ENCOUNTER — Ambulatory Visit: Payer: Medicare Other | Admitting: Family Medicine

## 2012-03-25 ENCOUNTER — Ambulatory Visit (INDEPENDENT_AMBULATORY_CARE_PROVIDER_SITE_OTHER): Payer: Medicare Other | Admitting: Family Medicine

## 2012-03-25 VITALS — BP 145/85 | HR 110 | Temp 99.2°F

## 2012-03-25 DIAGNOSIS — Z0000 Encounter for general adult medical examination without abnormal findings: Secondary | ICD-10-CM | POA: Diagnosis not present

## 2012-03-25 DIAGNOSIS — I10 Essential (primary) hypertension: Secondary | ICD-10-CM | POA: Diagnosis not present

## 2012-03-25 LAB — BASIC METABOLIC PANEL
BUN: 20 mg/dL (ref 6–23)
Creat: 0.6 mg/dL (ref 0.50–1.35)
Glucose, Bld: 95 mg/dL (ref 70–99)
Potassium: 3.8 mEq/L (ref 3.5–5.3)
Sodium: 137 mEq/L (ref 135–145)

## 2012-03-25 MED ORDER — LISINOPRIL-HYDROCHLOROTHIAZIDE 20-25 MG PO TABS: 1.0000 | ORAL_TABLET | Freq: Every day | ORAL | Status: DC

## 2012-03-25 NOTE — Progress Notes (Signed)
Subjective:     Patient ID: Anthony [name], male   DOB: [birth year], 42 y.o.   MRN: [id number]  CC - F/u HTN  HPI  Anthony [name] is a 42 y.o. male with h/o HTN, anxiety, asthma, and a pressure ulcer here to f/u his HTN.   1. HTN - Pt takes lisinopril-HCTZ 20-25mg  daily as prescribed and reports no HA or vision changes, with BP over the past many visits being well-controlled. He does not check BP at home due to cuff previously being very off. Needs refill today.  2. Pressure ulcers - Pt has chronic urethral open ulcer that he monitors daily and follows up with [facility] for. No signs of infection and pt well-aware of changing indwelling catheter and changes it once monthly. Denies fever/chills currently.  Review of Systems - Per HPI     Objective:   Physical Exam BP 145/85  Pulse 110  Temp(Src) 99.2 F (37.3 C) (Oral)  BP on recheck 140/90 GEN: NAD, sitting in wheelchair, pleasant PULM: Normal effort    Assessment:     Anthony [name] is a 42 y.o. male with h/o HTN, anxiety, asthma, and a pressure ulcer here to f/u his HTN.      Plan:     # Health maintenance -  - Pt refused flu shot and TDAP today  - f/u in 6 months, fasting lipid panel at that time

## 2012-03-25 NOTE — Patient Instructions (Addendum)
It was good to meet you today.  We are getting a basic metabolic panel today. I will call you if any results are abnormal; otherwise I'll send you a letter if everything looks fine. For your blood pressure, please keep taking your medication daily. I will call your pharmacy to have them stop mixing up the medications. On recheck, it was 136/82.  Please make a follow-up appt with me in 6 months. You can always come back sooner if you have any concerns. See if you can get an AM appt for a fasting lipid panel.  If you'd like to calibrate your BP cuff to ours, feel free to bring it in. Stay safe in this weather.

## 2012-03-26 ENCOUNTER — Encounter: Payer: Self-pay | Admitting: Family Medicine

## 2012-03-26 DIAGNOSIS — Z0000 Encounter for general adult medical examination without abnormal findings: Secondary | ICD-10-CM | POA: Insufficient documentation

## 2012-03-26 NOTE — Assessment & Plan Note (Addendum)
Well-controlled on lisinopril-HCTZ 20-25mg  daily.  - Re-prescribed x 6 months - BMET today WNL (Cr 0.6, K 3.8) - will send letter with results - F/u in 6 months - Reminded pt he can bring his BP cuff in to re-calibrate but he no longer has one - Will call pharmacy to make sure they delete the 20-12.5 dose that pt states they keep trying to dispense to him

## 2012-09-30 ENCOUNTER — Encounter: Payer: Self-pay | Admitting: Family Medicine

## 2012-09-30 ENCOUNTER — Ambulatory Visit (INDEPENDENT_AMBULATORY_CARE_PROVIDER_SITE_OTHER): Payer: Medicare Other | Admitting: Family Medicine

## 2012-09-30 VITALS — BP 137/80 | HR 80 | Temp 99.2°F | Wt 175.0 lb

## 2012-09-30 DIAGNOSIS — L8990 Pressure ulcer of unspecified site, unspecified stage: Secondary | ICD-10-CM

## 2012-09-30 DIAGNOSIS — Q057 Lumbar spina bifida without hydrocephalus: Secondary | ICD-10-CM | POA: Insufficient documentation

## 2012-09-30 DIAGNOSIS — Q059 Spina bifida, unspecified: Secondary | ICD-10-CM | POA: Insufficient documentation

## 2012-09-30 DIAGNOSIS — I10 Essential (primary) hypertension: Secondary | ICD-10-CM

## 2012-09-30 MED ORDER — LISINOPRIL-HYDROCHLOROTHIAZIDE 20-25 MG PO TABS: 1.0000 | ORAL_TABLET | Freq: Every day | ORAL | Status: DC

## 2012-09-30 NOTE — Progress Notes (Signed)
Patient ID: Anthony [name], male   DOB: Dec 08, 1970, 42 y.o.   MRN: [id number] Subjective:   CC: Follow-up HTN  HPI:   1. HTN: Patient takes lisinopril-HCTZ daily with no side effects of angioedema or cough or changes to urine. Denies headaches, blurred vision, dizziness, syncope, chest pain, or shortness of breath. Exercise consists of rolling himself in wheelchar 30-60 minutes 2x/week (on nice days, to nearby shopping center). Does not check BP at home due to cuff breaking.  2. Anxiety: Doing okay, especially helps to get out in wheelchair.  3. Spina bifida with pressure ulcer: Seeing urologist as needed, checks self regularly, no complaints/burning/pain/breakdown currently. Uses suprapubic catheter also with no signs of infection from that. Denies chills/fevers. Is wheelchair-bound but would like to have brace/crutches for occasional 1-2 times annually when he would like it to be easier for him to get in and out of family members' houses for holidays.  4. Health maintenance: Declines STD check, flu shot, TDAP, or pneumococcal shot today. Needs refill on BP medication today. Denies any unanticipated weight changes.   Review of Systems - Per HPI. Denies palpitations or chest pain (h/o WPW)  SH: - No tobacco or drug use. 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 Avoids caffeine.  Objective:  Physical Exam BP 137/80  Pulse 80  Temp(Src) 99.2 F (37.3 C) (Oral)  Wt 175 lb (79.379 kg) GEN: NAD, pleasant, wheelchair-bound HEENT: Atraumatic, normocephalic, neck supple, EOMI, sclera clear  CV: RRR, no murmurs, rubs, or gallops PULM: CTAB, normal effort SKIN: No rash or cyanosis; warm and well-perfused, dry skin at legs EXTR: No lower extremity edema or calf tenderness; legs are atrophied PSYCH: Mood and affect euthymic, normal rate and volume of speech NEURO: Awake, alert, normal speech, normal upper extremity strength and ROM;  wheelchair-bound with no gross sensation or strength lower extremities GU: Deferred due to reporting no issues  Assessment:     Anthony [name] is a 42 y.o. male with h/o spina bifida and HTN here for follow-up.    Plan:     # See problem list for problem-specific plans. - Declined flu, TDAP, and pneumococcal immunizations - Return in 1 year for health maintenance visit

## 2012-09-30 NOTE — Patient Instructions (Addendum)
It was good to see you today.  Blood pressure: Continue taking your blood pressure medicine. If you would like to bring your next cuff in for calibration you can make a nurse appointment. Continue doing a great job with exercise, especially on pretty days. I am giving you information about the DASH diet which is good for blood pressure control. I am refilling meds x 1 year  For your pressure ulcer: Keep examining your ulcer and the suprapubic catheter and if it gets signs of infection come in right away.  If you change your mind about flu shot, tetanus shot, or pneumonia shot, make a nurse visit appt.  I will start looking into getting crutches/brace and call or send you a letter with what I find out.   DASH Diet The DASH diet stands for "Dietary Approaches to Stop Hypertension." It is a healthy eating plan that has been shown to reduce high blood pressure (hypertension) in as little as 14 days, while also possibly providing other significant health benefits. These other health benefits include reducing the risk of breast cancer after menopause and reducing the risk of type 2 diabetes, heart disease, colon cancer, and stroke. Health benefits also include weight loss and slowing kidney failure in patients with chronic kidney disease.  DIET GUIDELINES  Limit salt (sodium). Your diet should contain less than 1500 mg of sodium daily.  Limit refined or processed carbohydrates. Your diet should include mostly whole grains. Desserts and added sugars should be used sparingly.  Include small amounts of heart-healthy fats. These types of fats include nuts, oils, and tub margarine. Limit saturated and trans fats. These fats have been shown to be harmful in the body. CHOOSING FOODS  The following food groups are based on a 2000 calorie diet. See your Registered Dietitian for individual calorie needs. Grains and Grain Products (6 to 8 servings daily)  Eat More Often: Whole-wheat bread, [place]dt rice,  whole-grain or wheat pasta, quinoa, popcorn without added fat or salt (air popped).  Eat Less Often: White bread, white pasta, white rice, cornbread. Vegetables (4 to 5 servings daily)  Eat More Often: Fresh, frozen, and canned vegetables. Vegetables may be raw, steamed, roasted, or grilled with a minimal amount of fat.  Eat Less Often/Avoid: Creamed or fried vegetables. Vegetables in a cheese sauce. Fruit (4 to 5 servings daily)  Eat More Often: All fresh, canned (in natural juice), or frozen fruits. Dried fruits without added sugar. One hundred percent fruit juice ( cup [237 mL] daily).  Eat Less Often: Dried fruits with added sugar. Canned fruit in light or heavy syrup. [employer], Fish, and Poultry (2 servings or less daily. One serving is 3 to 4 oz [85-114 g]).  Eat More Often: Ninety percent or leaner ground beef, tenderloin, sirloin. Round cuts of beef, chicken breast, [place] breast. All fish. Grill, bake, or broil your meat. Nothing should be fried.  Eat Less Often/Avoid: Fatty cuts of meat, [place], or chicken leg, thigh, or wing. Fried cuts of meat or fish. Dairy (2 to 3 servings)  Eat More Often: Low-fat or fat-free milk, low-fat plain or light yogurt, reduced-fat or part-skim cheese.  Eat Less Often/Avoid: Milk (whole, 2%).Whole milk yogurt. Full-fat cheeses. Nuts, Seeds, and Legumes (4 to 5 servings per week)  Eat More Often: All without added salt.  Eat Less Often/Avoid: Salted nuts and seeds, canned beans with added salt. Fats and Sweets (limited)  Eat More Often: Vegetable oils, tub margarines without trans fats, sugar-free gelatin. Mayonnaise  and salad dressings.  Eat Less Often/Avoid: Coconut oils, palm oils, butter, stick margarine, cream, half and half, cookies, candy, pie. FOR MORE INFORMATION The Dash Diet Eating Plan: www.dashdiet.org Document Released: 12/26/2010 Document Revised: 03/31/2011 Document Reviewed: 12/26/2010 [facility] Patient Information 2014  [place], [place].

## 2012-10-01 ENCOUNTER — Encounter: Payer: Self-pay | Admitting: Family Medicine

## 2012-10-01 NOTE — Assessment & Plan Note (Signed)
Patient wheelchair-bound but interested in crutches/brace for occasional times when wheelchair is difficult to use. - Will ask clinic staff how to find out if his medicare would cover this. - Declined digital rectal exam, reports no symptoms of bowel/bladder changes

## 2012-10-01 NOTE — Assessment & Plan Note (Signed)
Moderate control.  - Continue current regimen lisinopril-HCTZ. Refilled x 1 year per pt preferenc for f/u - Continue diet and exercise. Gave info on DASH diet - BMET 03/2012 WNL - F/u in 1 year or sooner with issues

## 2012-10-01 NOTE — Assessment & Plan Note (Signed)
No issues. Patient examines regularly and sees urologist as needed. Denies abdominal or back pain, fevers, or chills. - Also examine suprapubic catheter site for signs of infection.

## 2012-12-21 ENCOUNTER — Telehealth: Payer: Self-pay | Admitting: Family Medicine

## 2012-12-21 NOTE — Telephone Encounter (Signed)
Blue Team, please help me figure out if Medicare will cover brace+crutches for this patient. Thank you.  Leona Singleton, MD 12/21/2012 6:54 PM

## 2012-12-23 NOTE — Telephone Encounter (Signed)
Tried to call pt again and LM.  Jazmin Hartsell,CMA

## 2012-12-23 NOTE — Telephone Encounter (Signed)
LVM for pt to call back.  Jazmin Hartsell,CMA

## 2012-12-23 NOTE — Telephone Encounter (Signed)
Medicare will only cover these once every 3 years.  Casmira Cramer,CMA

## 2012-12-23 NOTE — Telephone Encounter (Signed)
Please call and let him know this. I don't think he has ever gotten one and he would like one.  Leona Singleton, MD

## 2012-12-24 NOTE — Telephone Encounter (Signed)
I just wanted to clarify my statement about DME once every 3 years.  He can only get 1 item once every 3 years.  Anything else will have to be paid out of pocket.  Does pt need both braces and crutches?  I'm still unable to reach him but just wanted to find out before he calls back. Girtha Kilgore,CMA

## 2012-12-24 NOTE — Telephone Encounter (Signed)
My understanding was that he was wanting braces + crutches so that he does not always have to rely on wheelchair (in rare instances when he wants to visit someone and help himself get in the door). Please clarify with him that he still wants this. I am unsure if he has already met his quota for the three years or if there is something he would be more interested in. Before you do too much more work on this, would be good to wait for him to call back and let him know of this 3 year limit and clarify these questions with him.  ThxLeona Singleton, MD

## 2012-12-28 ENCOUNTER — Telehealth: Payer: Self-pay | Admitting: Family Medicine

## 2012-12-28 DIAGNOSIS — Q057 Lumbar spina bifida without hydrocephalus: Secondary | ICD-10-CM

## 2012-12-28 NOTE — Telephone Encounter (Signed)
Patient asking for a script for his catheters. He usually gets them from his urologist but he owes the office a sum of money and will not refill any meds for him until he pays. States he is unable to do so right now but really needs the catheters. Please call patient.

## 2012-12-30 MED ORDER — FOLEY CATHETER 2-WAY MISC: Status: DC

## 2012-12-30 NOTE — Telephone Encounter (Signed)
Changed rx. Please print and fax. Thank you Blue Team!  Leona Singleton, MD

## 2012-12-30 NOTE — Telephone Encounter (Signed)
Pt informed that new rx was sent. Anthony [name], Anthony [name]

## 2012-12-30 NOTE — Telephone Encounter (Signed)
Spoke with patient and he would like these sent to [facility] supply.  I was going to print and fax over this rx, but after speaking with [facility] supply they need to following.  I called and got the specifics from the patient and will forward to MD to write the Rx:  1. The directions have to be specific (pt changes 1-2 times a month)  2. Dx code must be on Rx  3. Must also have [place] size and balloon size (pt reports 17F and 5cc ballon)  Will forward to MD and advised I would give the patient a call back when we fax to [facility] [phone]) Fleeger, Maryjo Rochester

## 2012-12-30 NOTE — Telephone Encounter (Signed)
Please let him know I have sent rx for "foley catheter 2-way MISC" to [facility] on [address] but to let us know if  1) I need to order a different type and 2) If he picks them up elsewhere and needs it sent there or needs a paper rx. I am happy to do this until he gets back in to see the urologist, which I recommend he does.  Leona Singleton, MD

## 2013-02-14 ENCOUNTER — Telehealth: Payer: Self-pay | Admitting: Family Medicine

## 2013-02-14 DIAGNOSIS — I10 Essential (primary) hypertension: Secondary | ICD-10-CM

## 2013-02-14 NOTE — Telephone Encounter (Signed)
Pt called and needs a 3 month refill on Lisinopril sent to his pharmacy. The last time he went to pick up his medication the pharmacy only had a 2 1/12 month supply. jw

## 2013-02-15 MED ORDER — LISINOPRIL-HYDROCHLOROTHIAZIDE 20-25 MG PO TABS: 1.0000 | ORAL_TABLET | Freq: Every day | ORAL | Status: DC

## 2013-02-15 NOTE — Telephone Encounter (Signed)
Unable to reach pt and LM.  Please inform that medication was refilled if he calls back. Thanks Limited BrandsJazmin Hartsell,CMA

## 2013-02-15 NOTE — Telephone Encounter (Signed)
That is strange - when I saw him in Sept, I had refilled it for 1 year. In any case, I refilled it again to last until the end of October 2015. Thank you.  Leona SingletonMaria T Nataliah Hatlestad, MD

## 2013-10-12 ENCOUNTER — Encounter: Payer: Self-pay | Admitting: Family Medicine

## 2013-10-12 NOTE — Progress Notes (Signed)
Placed in MDs box. Fleeger, Jessica Dawn  

## 2013-10-12 NOTE — Progress Notes (Signed)
Pt dropped off application for disability parking placard to be filled out and ask can Dr. Karie Schwalbe check permanent placard box instead of temporary placard box best number to call for pick up [phone].

## 2013-10-17 NOTE — Progress Notes (Signed)
Left voice message that PCP completed placard and placed in outgoing mail to home address.  Clovis Pu, RN

## 2013-12-02 ENCOUNTER — Other Ambulatory Visit: Payer: Self-pay | Admitting: Family Medicine

## 2013-12-02 DIAGNOSIS — I10 Essential (primary) hypertension: Secondary | ICD-10-CM

## 2013-12-02 NOTE — Telephone Encounter (Signed)
Refill request for lisinopril.

## 2013-12-03 MED ORDER — LISINOPRIL-HYDROCHLOROTHIAZIDE 20-25 MG PO TABS: 1.0000 | ORAL_TABLET | Freq: Every day | ORAL | Status: DC

## 2014-02-02 ENCOUNTER — Telehealth: Payer: Self-pay | Admitting: Family Medicine

## 2014-02-02 ENCOUNTER — Other Ambulatory Visit: Payer: Self-pay | Admitting: *Deleted

## 2014-02-02 ENCOUNTER — Other Ambulatory Visit: Payer: Self-pay | Admitting: Family Medicine

## 2014-02-02 DIAGNOSIS — I10 Essential (primary) hypertension: Secondary | ICD-10-CM

## 2014-02-02 MED ORDER — LISINOPRIL-HYDROCHLOROTHIAZIDE 20-25 MG PO TABS: 1.0000 | ORAL_TABLET | Freq: Every day | ORAL | Status: DC

## 2014-02-02 NOTE — Telephone Encounter (Signed)
Refilled lisinopril-HCTZ. Please call - patient needs f/u appt and if labs still okay can refill x 1 year.  Leona SingletonMaria T Megumi Treaster, MD

## 2014-02-02 NOTE — Telephone Encounter (Signed)
Pt called and needs a refill on his Lisinopril. jw  °

## 2014-02-02 NOTE — Telephone Encounter (Signed)
Pt has an appt on 02/06/14. Manan Olmo,CMA

## 2014-02-06 ENCOUNTER — Ambulatory Visit (INDEPENDENT_AMBULATORY_CARE_PROVIDER_SITE_OTHER): Payer: Medicare Other | Admitting: Family Medicine

## 2014-02-06 ENCOUNTER — Encounter: Payer: Self-pay | Admitting: Family Medicine

## 2014-02-06 VITALS — BP 136/90 | HR 108 | Temp 98.6°F

## 2014-02-06 DIAGNOSIS — B9789 Other viral agents as the cause of diseases classified elsewhere: Secondary | ICD-10-CM

## 2014-02-06 DIAGNOSIS — E663 Overweight: Secondary | ICD-10-CM

## 2014-02-06 DIAGNOSIS — I10 Essential (primary) hypertension: Secondary | ICD-10-CM

## 2014-02-06 DIAGNOSIS — J329 Chronic sinusitis, unspecified: Secondary | ICD-10-CM | POA: Diagnosis not present

## 2014-02-06 DIAGNOSIS — B349 Viral infection, unspecified: Secondary | ICD-10-CM | POA: Diagnosis not present

## 2014-02-06 LAB — COMPREHENSIVE METABOLIC PANEL
ALBUMIN: 4.2 g/dL (ref 3.5–5.2)
ALK PHOS: 65 U/L (ref 39–117)
ALT: 14 U/L (ref 0–53)
AST: 15 U/L (ref 0–37)
BILIRUBIN TOTAL: 0.4 mg/dL (ref 0.2–1.2)
BUN: 9 mg/dL (ref 6–23)
CHLORIDE: 103 meq/L (ref 96–112)
CO2: 27 mEq/L (ref 19–32)
CREATININE: 0.64 mg/dL (ref 0.50–1.35)
Calcium: 9.6 mg/dL (ref 8.4–10.5)
Glucose, Bld: 89 mg/dL (ref 70–99)
POTASSIUM: 3.7 meq/L (ref 3.5–5.3)
Sodium: 141 mEq/L (ref 135–145)
Total Protein: 7.4 g/dL (ref 6.0–8.3)

## 2014-02-06 LAB — LIPID PANEL
CHOL/HDL RATIO: 7 ratio
CHOLESTEROL: 175 mg/dL (ref 0–200)
HDL: 25 mg/dL — ABNORMAL LOW (ref >39)
LDL Cholesterol: 107 mg/dL — ABNORMAL HIGH (ref 0–99)
TRIGLYCERIDES: 216 mg/dL — AB (ref <150)
VLDL: 43 mg/dL — AB (ref 0–40)

## 2014-02-06 MED ORDER — CARBAMIDE PEROXIDE 6.5 % OT SOLN: 5.0000 [drp] | Freq: Two times a day (BID) | OTIC | Status: DC

## 2014-02-06 MED ORDER — LISINOPRIL-HYDROCHLOROTHIAZIDE 20-25 MG PO TABS: 1.0000 | ORAL_TABLET | Freq: Every day | ORAL | Status: DC

## 2014-02-06 MED ORDER — FLUTICASONE PROPIONATE 50 MCG/ACT NA SUSP: 2.0000 | Freq: Every day | NASAL | Status: DC

## 2014-02-06 NOTE — Progress Notes (Signed)
Patient ID: Anthony [name], male   DOB: 1970-11-08, 44 y.o.   MRN: [id number] Subjective:   CC: F/u HTN  HPI:   F/u HTN On lisinopril-HCTZ which he takes daily Last seen Sept 2014. No chest pain or dyspnea or leg swelling. Eating better than used to with staying away from processed foods. More beans and rice with veggies. Keeping salt to minimum. Cut out sweet tea. Starting to lose weight, clothes fitting better. Has not checked BP at home due to cuff not giving correct values.  Sinus problems Has been dealing with sinus burning pain for about 7 days. Occurs mostly at night from throat to behind eyes. Getting headaches from this. This occurs once annually. Has tried advil which has not helped much. Intermittent nasal congestion vs drainage. Clear rhinorrhea. Occasional sneezing or cough. No chest pain or dyspnea. No difficulty staying hydrated or sore throat. Minor subjective fever. Urine is normal. No vomiting or diarrhea.  Review of Systems - Per HPI.   PMH - anxiety, impotence, HTN, chronic venous insufficiency, allergic rhinitis, asthma, pressure ulcer, dyspnea, bilateral shoulder pain, spina bifida Smoking status: quit 10 y ago, had intermittently smoked for 20+ years    Objective:  Physical Exam BP 136/90 mmHg  Pulse 108  Temp(Src) 98.6 F (37 C) (Oral)  Wt  GEN: NAD, seated in wheelchair CV: RRR, no m/r/g, occ PVC or PAC, 2+ B radial pulses PULM: CTAB, normal effort EXTR: No LE edema or calf tenderness; thin LEs HEENT: AT/[place], sclera clear, EOMI, o/p clear with mild erythema but no exudate, TMs right mostly occluded with wax with small window allowing visualization of a clear upper TM, left 1/2 occluded with clear TM, neck supple, no LAD SKIN: No rash or cyanosis    Assessment:     Anthony [name] is a 44 y.o. pleasant male here for follow up of HTN and with sinus pain.    Plan:     # See problem list and after visit summary for problem-specific plans.  #  Health Maintenance:  - Due for HM visit - Discussed seeing a dentist for poor dentition.   Follow-up: Follow up when convenient for health maintenance visit; f/u 1 month nurse visit for BMET and BP recheck; f/u 1 week if no improvement in sinus pain.   Leona SingletonMaria T Kyara Boxer, MD [facility]

## 2014-02-06 NOTE — Patient Instructions (Addendum)
Good to see you today!  Your BP has been a little high the last few visits.  Increase to 1.5 tablets (1 tablet in morning, 1/2 tablet at night).  Check 3 BP values at home and call me with them. Come back in 1 month for lab visit to check your kidneys again.  For your sinus infection, this is likely viral and should improve on its own. Use flonase daily (nasal spray) daily. You can try mucinex to see if this helps. If not better in 1 week, come back.  Use debrox (or mineral oil 1 drop) daily in both ears to soften the wax.  See a dentist.  We are getting other labs today and I will call if NOT normal.  Best,  Leona SingletonMaria T Doraine Schexnider, MD

## 2014-02-07 DIAGNOSIS — B9789 Other viral agents as the cause of diseases classified elsewhere: Secondary | ICD-10-CM | POA: Insufficient documentation

## 2014-02-07 DIAGNOSIS — J329 Chronic sinusitis, unspecified: Secondary | ICD-10-CM

## 2014-02-07 NOTE — Assessment & Plan Note (Signed)
Likely viral sinusitis with no fevers here. Mild vertigo with sinusitis with head movement, likely to improve with symptom improvement. - Supportive care with fluids, rest. - Flonase daily and debrox for cerumen impaction right ear; try mucinex - Return in 1 week if no better or sooner if difficulty breathing, unable to stay hydrated, high fevers.

## 2014-02-07 NOTE — Assessment & Plan Note (Addendum)
Mildly elevated for age today. Compliant. No signs of end-organ damage. - Refill lisinopril-HCTZ for 1 year and instructed to take 1 tab AM and 0.5 tab PM for slightly better BP control. - CMET and lipid panel today. - Congratulated and encouraged re: his exercise and improved dietary habits. - Check 3 BP values over next 2 weeks and call with them; nurse visit 1 month for BMET and calibrate home BP cuff.

## 2014-02-16 ENCOUNTER — Telehealth: Payer: Self-pay | Admitting: Family Medicine

## 2014-02-16 DIAGNOSIS — E785 Hyperlipidemia, unspecified: Secondary | ICD-10-CM

## 2014-02-16 NOTE — Telephone Encounter (Signed)
Please let Mr Anthony [name] know that cholesterol panel put him at a slightly higher risk of heart attack or stroke in the next 10 years (8.5%; we consider medication at 7.5%). He would benefit from a cholesterol medication (atorvastatin or rosouvastatin) to lower this risk along with the work he is doing with dieting and exercise. If he is amenable, I will send an rx in. Side effects to look out for are muscle aches but are rare.  His metabolic panel was normal.  Leona SingletonMaria T Kerem Gilmer, MD

## 2014-02-17 NOTE — Telephone Encounter (Signed)
LMOVM for pt to return call .Anthony [name]  

## 2014-02-20 MED ORDER — ATORVASTATIN CALCIUM 40 MG PO TABS: 40.0000 mg | ORAL_TABLET | Freq: Every day | ORAL | Status: DC

## 2014-02-20 NOTE — Telephone Encounter (Signed)
Will forward to MD to send in medication. Eryk Beavers,CMA

## 2014-02-20 NOTE — Telephone Encounter (Signed)
Atorvastatin 40mg  daily prescribed.  Anthony SingletonMaria T Kyia Rhude, MD

## 2014-02-20 NOTE — Telephone Encounter (Signed)
Gave message to pt. [facility] is [facility] [address]

## 2014-02-20 NOTE — Addendum Note (Signed)
Addended by: Simone CuriaHEKKEKANDAM, Abbott Jasinski T on: 02/20/2014 12:08 PM   Modules accepted: Orders

## 2014-02-21 ENCOUNTER — Telehealth: Payer: Self-pay | Admitting: Family Medicine

## 2014-02-21 DIAGNOSIS — E785 Hyperlipidemia, unspecified: Secondary | ICD-10-CM

## 2014-02-21 MED ORDER — ROSUVASTATIN CALCIUM 20 MG PO TABS: 20.0000 mg | ORAL_TABLET | Freq: Every day | ORAL | Status: DC

## 2014-02-21 NOTE — Telephone Encounter (Signed)
Sent in rosuvastatin. Please ask patient to call pharmacy to find out cost of this with coverage. If still expensive, he can ask them what would be affordable and he can tell us name.  Anthony SingletonMaria T Nikkita Adeyemi, MD

## 2014-02-21 NOTE — Telephone Encounter (Signed)
Pt called because the Lipitor that was called in too expensive. He would like something else called in that would be cheaper. jw

## 2014-02-21 NOTE — Telephone Encounter (Signed)
Will forward to MD. Anthony [name],CMA  

## 2014-02-22 MED ORDER — LOVASTATIN 20 MG PO TABS: 40.0000 mg | ORAL_TABLET | Freq: Every day | ORAL | Status: DC

## 2014-02-22 NOTE — Telephone Encounter (Signed)
Pt states that lovastatin is on the $4 list. Fleeger, Maryjo RochesterJessica Dawn

## 2014-02-22 NOTE — Telephone Encounter (Signed)
I have sent in lovastatin. This is not as strong a cholesterol medicine so I want him to take 2 tablets nightly, though even this is not quite as strong as either the atorvastatin or rosuvastatin would have been.   Anthony SingletonMaria T Darshay Deupree, MD

## 2014-02-22 NOTE — Telephone Encounter (Signed)
LMOVM for pt to return call .Anthony [name]  

## 2014-02-22 NOTE — Addendum Note (Signed)
Addended by: Simone CuriaHEKKEKANDAM, Netra Postlethwait T on: 02/22/2014 06:30 PM   Modules accepted: Orders, Medications

## 2014-02-22 NOTE — Telephone Encounter (Signed)
Pt states that the crestor is $263 per month.  Wants MD to call in something on the $4 list @ [facility] [address]. Narelle Schoening, Maryjo RochesterJessica Dawn

## 2014-02-23 NOTE — Telephone Encounter (Signed)
Pt is aware of medication change. Jazmin Hartsell,CMA

## 2014-03-09 ENCOUNTER — Ambulatory Visit (INDEPENDENT_AMBULATORY_CARE_PROVIDER_SITE_OTHER): Payer: Medicare Other | Admitting: Family Medicine

## 2014-03-09 ENCOUNTER — Encounter: Payer: Self-pay | Admitting: Family Medicine

## 2014-03-09 VITALS — BP 111/63 | HR 92 | Temp 98.8°F | Wt 175.0 lb

## 2014-03-09 DIAGNOSIS — I10 Essential (primary) hypertension: Secondary | ICD-10-CM

## 2014-03-09 DIAGNOSIS — E785 Hyperlipidemia, unspecified: Secondary | ICD-10-CM

## 2014-03-09 DIAGNOSIS — Z79899 Other long term (current) drug therapy: Secondary | ICD-10-CM

## 2014-03-09 MED ORDER — LOVASTATIN 20 MG PO TABS: 40.0000 mg | ORAL_TABLET | Freq: Every day | ORAL | Status: DC

## 2014-03-09 NOTE — Progress Notes (Signed)
Patient ID: Anthony [name], male   DOB: Mar 24, 1970, 44 y.o.   MRN: [id number] Subjective:   CC: F/u BP  HPI:   F/u hypertension Mr Anthony [name] has been taking Lisinopril-HCTZ, increased to 1.5 tab daily at last visit. He is tolerating this well with a little more fatigue than usual but it is tolerable and he thinks he may just be out of shape because he is working out less with cold weather. BP at home 110/80 on average. No dizziness, chest pain, dyspnea, worsened leg swelling, headache, or blurred vision. BP cuff seems to be working well.  Hyperlipidemia For HLD, taking lovastatin due to cost (total 40mg ), daily, no muscle pain.    Review of Systems - Per HPI.   PMH: Reviewed; spina bifida hx Smoking status: never smoker    Objective:  Physical Exam BP 111/63 mmHg  Pulse 92  Temp(Src) 98.8 F (37.1 C) (Oral)  Wt 175 lb (79.379 kg) GEN: NAD CV: Mild tachy to 109 initially (rushing to get here), regular rhythm with occ PVC, rate decreased by end of visit to 92; 2+ B radial pulses EXTR: No LE edema; atrophied PULM: CTAB, normal effort    Assessment:     Anthony [name] is a 44 y.o. male here for f/u HTN and HLD.    Plan:     # See problem list and after visit summary for problem-specific plans.   # Health Maintenance:  - Return for HM visit   Follow-up: Follow up in 3-4 months for f/u BP.     Leona SingletonMaria T Ollin Hochmuth, MD [facility]

## 2014-03-09 NOTE — Patient Instructions (Signed)
Blood pressure looks good and you are doing well. Continue taking the dose we discussed. Continue exercising to stay healthy and keep energetic. If you feel you are feeling drained and this is not improving despite regular exercise, follow up with me. Follow up for BP in 4 months.  For cholesterol, we can check this 6 months after you started medication.  Return when convenient for health maintenance visit.

## 2014-03-10 ENCOUNTER — Telehealth: Payer: Self-pay | Admitting: Family Medicine

## 2014-03-10 DIAGNOSIS — N179 Acute kidney failure, unspecified: Secondary | ICD-10-CM

## 2014-03-10 LAB — BASIC METABOLIC PANEL
BUN: 18 mg/dL (ref 6–23)
CALCIUM: 9.7 mg/dL (ref 8.4–10.5)
CO2: 26 meq/L (ref 19–32)
Chloride: 101 mEq/L (ref 96–112)
Creat: 1 mg/dL (ref 0.50–1.35)
GLUCOSE: 102 mg/dL — AB (ref 70–99)
POTASSIUM: 3.3 meq/L — AB (ref 3.5–5.3)
Sodium: 140 mEq/L (ref 135–145)

## 2014-03-10 NOTE — Telephone Encounter (Signed)
Called to inform patient that potassium on BMET mildly low and creatinine increased 50% (though still normal at 1). Unable to get him on the phone so sending letter stating this and that he should eat foods high in potassium (bananas) and stay hydrated and we can recheck BMET in 2 more weeks (he can call and make lab-only appt and I have ordered lab).  Anthony SingletonMaria T Lailani Tool, MD

## 2014-03-11 DIAGNOSIS — E785 Hyperlipidemia, unspecified: Secondary | ICD-10-CM | POA: Insufficient documentation

## 2014-03-11 NOTE — Assessment & Plan Note (Signed)
Lovastatin 40mg  daily due to cost though would benefit from higher intensity statin. - Continue lovastatin, recheck lipids ~5 mo. - Refilled today x 1 year.

## 2014-03-11 NOTE — Assessment & Plan Note (Signed)
BP well controlled. Mild fatigue. REcent increase of lisinopril-hctz to 1.5 tabs daily. - BMET today. - F/u 3-4 months; sooner if increasing fatigue. - Try to exercise 5-6 times weekly.

## 2014-07-21 DIAGNOSIS — S139XXA Sprain of joints and ligaments of unspecified parts of neck, initial encounter: Secondary | ICD-10-CM | POA: Diagnosis not present

## 2014-07-21 DIAGNOSIS — S20212A Contusion of left front wall of thorax, initial encounter: Secondary | ICD-10-CM | POA: Diagnosis not present

## 2014-10-10 DIAGNOSIS — S233XXA Sprain of ligaments of thoracic spine, initial encounter: Secondary | ICD-10-CM | POA: Diagnosis not present

## 2015-01-22 ENCOUNTER — Other Ambulatory Visit: Payer: Self-pay | Admitting: Family Medicine

## 2015-11-08 ENCOUNTER — Other Ambulatory Visit: Payer: Self-pay | Admitting: Internal Medicine

## 2015-11-08 DIAGNOSIS — I10 Essential (primary) hypertension: Secondary | ICD-10-CM

## 2015-11-08 NOTE — Telephone Encounter (Signed)
Needs prescription refilled--[facility]isionpril. [facility] [address]

## 2015-11-09 MED ORDER — LISINOPRIL-HYDROCHLOROTHIAZIDE 20-25 MG PO TABS: 1.0000 | ORAL_TABLET | Freq: Every day | ORAL | 3 refills | Status: DC

## 2016-11-09 ENCOUNTER — Other Ambulatory Visit: Payer: Self-pay | Admitting: Internal Medicine

## 2016-11-09 DIAGNOSIS — I10 Essential (primary) hypertension: Secondary | ICD-10-CM

## 2016-11-14 ENCOUNTER — Telehealth: Payer: Self-pay | Admitting: Internal Medicine

## 2016-11-14 NOTE — Telephone Encounter (Signed)
Please let patient know he can not get refills as he has not been to the clinic for 2 years. Thanks Rakayla Ricklefs

## 2016-11-27 NOTE — Telephone Encounter (Signed)
LMOVm for pt to call us back to schedule an appt for med refill. Deseree Bruna PotterBlount, CMA

## 2017-01-23 ENCOUNTER — Telehealth: Payer: Self-pay | Admitting: Internal Medicine

## 2017-01-23 DIAGNOSIS — N319 Neuromuscular dysfunction of bladder, unspecified: Secondary | ICD-10-CM | POA: Diagnosis not present

## 2017-01-23 DIAGNOSIS — N200 Calculus of kidney: Secondary | ICD-10-CM | POA: Diagnosis not present

## 2017-01-23 DIAGNOSIS — K611 Rectal abscess: Secondary | ICD-10-CM | POA: Diagnosis not present

## 2017-01-23 NOTE — Telephone Encounter (Addendum)
Patient seen in urology clinic earlier today by Dr. Sande BrothersWinters. CT abdomen pelvis without contrast obatined (imaging in PACS) with following findings: large 6cm fluid and gas collection superficial to right ischial tuberosity, with presence of continuous gas and fluid tracking near anus. Fistulous connection could not be excluded. Additional fluid collection near left ischial tuberosity with ?connection to base of penis, smaller. Findings concerning for possible perirectal abscess.   Discussed findings with patient. Currently afebrile with tenderness on right side near rectum and posterior erythema on exam in clinic. Given findings, recommended proceeding to emergency room for furrther evaluation. CT pelvis with contrast recommended, and evaluation by general surgery. From urologic standpoint, nothing to do at this time given no visible urologic abnormalities on CT scan.    Urology Attending I was with Dr. Merrily PewFilippou in the operating room when she spoke with the patient about proceeding to the ED immediately to be evaluated.  His CT is concerning for possible necrotizing fascitis involving the gluteal subcutaneous tissue along with possible pelvic abscesses and will likely need to be debrided/drained, if possible.    Rhoderick Moodyhristopher Winter, MD [facility]

## 2017-01-24 ENCOUNTER — Inpatient Hospital Stay (HOSPITAL_COMMUNITY)
Admission: EM | Admit: 2017-01-24 | Discharge: 2017-02-03 | DRG: 871 | Disposition: A | Discharge status: Skilled Nursing Facility | Payer: Medicare Other | Attending: General Surgery | Admitting: General Surgery

## 2017-01-24 ENCOUNTER — Encounter (HOSPITAL_COMMUNITY): Admission: EM | Disposition: A | Discharge status: Skilled Nursing Facility | Payer: Self-pay | Source: Home / Self Care

## 2017-01-24 ENCOUNTER — Emergency Department (HOSPITAL_COMMUNITY): Payer: Medicare Other | Admitting: Certified Registered Nurse Anesthetist

## 2017-01-24 ENCOUNTER — Encounter (HOSPITAL_COMMUNITY): Payer: Self-pay | Admitting: Emergency Medicine

## 2017-01-24 DIAGNOSIS — I10 Essential (primary) hypertension: Secondary | ICD-10-CM | POA: Diagnosis present

## 2017-01-24 DIAGNOSIS — Z9889 Other specified postprocedural states: Secondary | ICD-10-CM

## 2017-01-24 DIAGNOSIS — K6139 Other ischiorectal abscess: Secondary | ICD-10-CM | POA: Diagnosis present

## 2017-01-24 DIAGNOSIS — L98499 Non-pressure chronic ulcer of skin of other sites with unspecified severity: Secondary | ICD-10-CM | POA: Diagnosis present

## 2017-01-24 DIAGNOSIS — E871 Hypo-osmolality and hyponatremia: Secondary | ICD-10-CM | POA: Diagnosis present

## 2017-01-24 DIAGNOSIS — L0231 Cutaneous abscess of buttock: Secondary | ICD-10-CM | POA: Diagnosis present

## 2017-01-24 DIAGNOSIS — N361 Urethral diverticulum: Secondary | ICD-10-CM | POA: Diagnosis not present

## 2017-01-24 DIAGNOSIS — L89894 Pressure ulcer of other site, stage 4: Secondary | ICD-10-CM | POA: Diagnosis present

## 2017-01-24 DIAGNOSIS — L8944 Pressure ulcer of contiguous site of back, buttock and hip, stage 4: Secondary | ICD-10-CM | POA: Diagnosis present

## 2017-01-24 DIAGNOSIS — M436 Torticollis: Secondary | ICD-10-CM | POA: Diagnosis not present

## 2017-01-24 DIAGNOSIS — Q057 Lumbar spina bifida without hydrocephalus: Secondary | ICD-10-CM | POA: Diagnosis not present

## 2017-01-24 DIAGNOSIS — N319 Neuromuscular dysfunction of bladder, unspecified: Secondary | ICD-10-CM | POA: Diagnosis present

## 2017-01-24 DIAGNOSIS — Z993 Dependence on wheelchair: Secondary | ICD-10-CM | POA: Diagnosis not present

## 2017-01-24 DIAGNOSIS — Q059 Spina bifida, unspecified: Secondary | ICD-10-CM

## 2017-01-24 DIAGNOSIS — L8990 Pressure ulcer of unspecified site, unspecified stage: Secondary | ICD-10-CM

## 2017-01-24 DIAGNOSIS — G8250 Quadriplegia, unspecified: Secondary | ICD-10-CM | POA: Diagnosis present

## 2017-01-24 DIAGNOSIS — A419 Sepsis, unspecified organism: Principal | ICD-10-CM | POA: Diagnosis present

## 2017-01-24 DIAGNOSIS — E876 Hypokalemia: Secondary | ICD-10-CM | POA: Diagnosis present

## 2017-01-24 DIAGNOSIS — R197 Diarrhea, unspecified: Secondary | ICD-10-CM | POA: Diagnosis not present

## 2017-01-24 DIAGNOSIS — Z79899 Other long term (current) drug therapy: Secondary | ICD-10-CM

## 2017-01-24 DIAGNOSIS — R531 Weakness: Secondary | ICD-10-CM | POA: Diagnosis not present

## 2017-01-24 DIAGNOSIS — G8220 Paraplegia, unspecified: Secondary | ICD-10-CM

## 2017-01-24 DIAGNOSIS — E785 Hyperlipidemia, unspecified: Secondary | ICD-10-CM | POA: Diagnosis not present

## 2017-01-24 DIAGNOSIS — L89320 Pressure ulcer of left buttock, unstageable: Secondary | ICD-10-CM | POA: Diagnosis not present

## 2017-01-24 DIAGNOSIS — L988 Other specified disorders of the skin and subcutaneous tissue: Secondary | ICD-10-CM | POA: Diagnosis present

## 2017-01-24 DIAGNOSIS — Z936 Other artificial openings of urinary tract status: Secondary | ICD-10-CM

## 2017-01-24 DIAGNOSIS — R109 Unspecified abdominal pain: Secondary | ICD-10-CM | POA: Diagnosis not present

## 2017-01-24 DIAGNOSIS — Z881 Allergy status to other antibiotic agents status: Secondary | ICD-10-CM

## 2017-01-24 DIAGNOSIS — B966 Bacteroides fragilis [B. fragilis] as the cause of diseases classified elsewhere: Secondary | ICD-10-CM | POA: Diagnosis present

## 2017-01-24 DIAGNOSIS — I872 Venous insufficiency (chronic) (peripheral): Secondary | ICD-10-CM | POA: Diagnosis not present

## 2017-01-24 DIAGNOSIS — L0291 Cutaneous abscess, unspecified: Secondary | ICD-10-CM | POA: Diagnosis not present

## 2017-01-24 DIAGNOSIS — R509 Fever, unspecified: Secondary | ICD-10-CM | POA: Diagnosis not present

## 2017-01-24 HISTORY — DX: Other ischiorectal abscess: K61.39

## 2017-01-24 HISTORY — DX: Other specified postprocedural states: Z98.890

## 2017-01-24 HISTORY — PX: INCISION AND DRAINAGE OF WOUND: SHX1803

## 2017-01-24 LAB — COMPREHENSIVE METABOLIC PANEL
ALT: 21 U/L (ref 17–63)
AST: 21 U/L (ref 15–41)
Albumin: 2.5 g/dL — ABNORMAL LOW (ref 3.5–5.0)
Alkaline Phosphatase: 69 U/L (ref 38–126)
Anion gap: 12 (ref 5–15)
BILIRUBIN TOTAL: 0.6 mg/dL (ref 0.3–1.2)
BUN: 15 mg/dL (ref 6–20)
CO2: 24 mmol/L (ref 22–32)
CREATININE: 0.89 mg/dL (ref 0.61–1.24)
Calcium: 8.1 mg/dL — ABNORMAL LOW (ref 8.9–10.3)
Chloride: 93 mmol/L — ABNORMAL LOW (ref 101–111)
GFR calc Af Amer: >60 mL/min (ref >60)
Glucose, Bld: 126 mg/dL — ABNORMAL HIGH (ref 65–99)
Potassium: 2.1 mmol/L — CL (ref 3.5–5.1)
Sodium: 129 mmol/L — ABNORMAL LOW (ref 135–145)
TOTAL PROTEIN: 7 g/dL (ref 6.5–8.1)

## 2017-01-24 LAB — CBC WITH DIFFERENTIAL/PLATELET
BASOS ABS: 0 10*3/uL (ref 0.0–0.1)
Basophils Relative: 0 %
Eosinophils Absolute: 0.1 10*3/uL (ref 0.0–0.7)
Eosinophils Relative: 0 %
HEMATOCRIT: 31.4 % — AB (ref 39.0–52.0)
Hemoglobin: 11.1 g/dL — ABNORMAL LOW (ref 13.0–17.0)
LYMPHS ABS: 0.8 10*3/uL (ref 0.7–4.0)
LYMPHS PCT: 4 %
MCH: 28.6 pg (ref 26.0–34.0)
MCHC: 35.4 g/dL (ref 30.0–36.0)
MCV: 80.9 fL (ref 78.0–100.0)
Monocytes Absolute: 1.9 10*3/uL — ABNORMAL HIGH (ref 0.1–1.0)
Monocytes Relative: 9 %
NEUTROS ABS: 19.1 10*3/uL — AB (ref 1.7–7.7)
Neutrophils Relative %: 87 %
Platelets: 378 10*3/uL (ref 150–400)
RBC: 3.88 MIL/uL — AB (ref 4.22–5.81)
RDW: 13.1 % (ref 11.5–15.5)
WBC: 21.9 10*3/uL — ABNORMAL HIGH (ref 4.0–10.5)

## 2017-01-24 LAB — I-STAT CHEM 8, ED
BUN: 13 mg/dL (ref 6–20)
CALCIUM ION: 1 mmol/L — AB (ref 1.15–1.40)
CREATININE: 0.8 mg/dL (ref 0.61–1.24)
Chloride: 91 mmol/L — ABNORMAL LOW (ref 101–111)
Glucose, Bld: 131 mg/dL — ABNORMAL HIGH (ref 65–99)
HCT: 33 % — ABNORMAL LOW (ref 39.0–52.0)
Hemoglobin: 11.2 g/dL — ABNORMAL LOW (ref 13.0–17.0)
Potassium: 2.2 mmol/L — CL (ref 3.5–5.1)
Sodium: 131 mmol/L — ABNORMAL LOW (ref 135–145)
TCO2: 25 mmol/L (ref 22–32)

## 2017-01-24 LAB — URINALYSIS, ROUTINE W REFLEX MICROSCOPIC
Bilirubin Urine: NEGATIVE
GLUCOSE, UA: NEGATIVE mg/dL
Ketones, ur: NEGATIVE mg/dL
Leukocytes, UA: NEGATIVE
Nitrite: NEGATIVE
Protein, ur: NEGATIVE mg/dL
SPECIFIC GRAVITY, URINE: 1.004 — AB (ref 1.005–1.030)
pH: 6 (ref 5.0–8.0)

## 2017-01-24 LAB — I-STAT CG4 LACTIC ACID, ED
Lactic Acid, Venous: 1.23 mmol/L (ref 0.5–1.9)
Lactic Acid, Venous: 1.24 mmol/L (ref 0.5–1.9)

## 2017-01-24 LAB — BASIC METABOLIC PANEL
ANION GAP: 10 (ref 5–15)
BUN: 10 mg/dL (ref 6–20)
CALCIUM: 7.5 mg/dL — AB (ref 8.9–10.3)
CO2: 24 mmol/L (ref 22–32)
Chloride: 103 mmol/L (ref 101–111)
Creatinine, Ser: 0.69 mg/dL (ref 0.61–1.24)
GLUCOSE: 116 mg/dL — AB (ref 65–99)
Potassium: 2.4 mmol/L — CL (ref 3.5–5.1)
SODIUM: 137 mmol/L (ref 135–145)

## 2017-01-24 LAB — MAGNESIUM: Magnesium: 1.7 mg/dL (ref 1.7–2.4)

## 2017-01-24 SURGERY — IRRIGATION AND DEBRIDEMENT WOUND
Anesthesia: General

## 2017-01-24 MED ORDER — KCL IN DEXTROSE-NACL 40-5-0.9 MEQ/L-%-% IV SOLN
INTRAVENOUS | Status: DC
  Administered 2017-01-24 – 2017-01-27 (×6): via INTRAVENOUS
  Filled 2017-01-24 (×11): qty 1000

## 2017-01-24 MED ORDER — SODIUM CHLORIDE 0.9 % IR SOLN
Status: DC | PRN
  Administered 2017-01-24: 3000 mL

## 2017-01-24 MED ORDER — ALBUMIN HUMAN 5 % IV SOLN
INTRAVENOUS | Status: DC | PRN
  Administered 2017-01-24: 20:00:00 via INTRAVENOUS

## 2017-01-24 MED ORDER — SODIUM CHLORIDE 0.9 % IV SOLN
3.0000 g | INTRAVENOUS | Status: AC
  Administered 2017-01-24: 3 g via INTRAVENOUS
  Filled 2017-01-24: qty 3

## 2017-01-24 MED ORDER — PHENYLEPHRINE 40 MCG/ML (10ML) SYRINGE FOR IV PUSH (FOR BLOOD PRESSURE SUPPORT)
PREFILLED_SYRINGE | INTRAVENOUS | Status: DC | PRN
  Administered 2017-01-24: 100 ug via INTRAVENOUS
  Administered 2017-01-24: 160 ug via INTRAVENOUS
  Administered 2017-01-24: 80 ug via INTRAVENOUS
  Administered 2017-01-24: 200 ug via INTRAVENOUS
  Administered 2017-01-24: 80 ug via INTRAVENOUS
  Administered 2017-01-24: 120 ug via INTRAVENOUS

## 2017-01-24 MED ORDER — SODIUM CHLORIDE 0.9 % IV SOLN
460.0000 mg | INTRAVENOUS | Status: AC
  Administered 2017-01-24: 460 mg via INTRAVENOUS
  Filled 2017-01-24: qty 9.2

## 2017-01-24 MED ORDER — FENTANYL CITRATE (PF) 250 MCG/5ML IJ SOLN
INTRAMUSCULAR | Status: AC
  Filled 2017-01-24: qty 5

## 2017-01-24 MED ORDER — SODIUM CHLORIDE 0.9 % IV BOLUS (SEPSIS)
1000.0000 mL | Freq: Once | INTRAVENOUS | Status: AC
  Administered 2017-01-24: 1000 mL via INTRAVENOUS

## 2017-01-24 MED ORDER — SODIUM CHLORIDE 0.9 % IV SOLN
Freq: Once | INTRAVENOUS | Status: AC
Start: 2017-01-24 — End: 2017-01-24
  Administered 2017-01-24: 100 mL/h via INTRAVENOUS

## 2017-01-24 MED ORDER — POTASSIUM CHLORIDE 10 MEQ/100ML IV SOLN
10.0000 meq | INTRAVENOUS | Status: AC
  Administered 2017-01-24 (×2): 10 meq via INTRAVENOUS
  Filled 2017-01-24 (×2): qty 100

## 2017-01-24 MED ORDER — ROCURONIUM BROMIDE 50 MG/5ML IV SOSY
PREFILLED_SYRINGE | INTRAVENOUS | Status: AC
  Filled 2017-01-24: qty 5

## 2017-01-24 MED ORDER — SUGAMMADEX SODIUM 200 MG/2ML IV SOLN
INTRAVENOUS | Status: DC | PRN
  Administered 2017-01-24: 200 mg via INTRAVENOUS

## 2017-01-24 MED ORDER — SODIUM CHLORIDE 0.9 % IV SOLN
460.0000 mg | INTRAVENOUS | Status: DC
  Filled 2017-01-24: qty 9.2

## 2017-01-24 MED ORDER — LIDOCAINE 2% (20 MG/ML) 5 ML SYRINGE
INTRAMUSCULAR | Status: AC
  Filled 2017-01-24: qty 5

## 2017-01-24 MED ORDER — POTASSIUM CHLORIDE CRYS ER 20 MEQ PO TBCR
40.0000 meq | EXTENDED_RELEASE_TABLET | Freq: Three times a day (TID) | ORAL | Status: DC
  Administered 2017-01-25 – 2017-01-27 (×10): 40 meq via ORAL
  Filled 2017-01-24 (×10): qty 2

## 2017-01-24 MED ORDER — PROPOFOL 10 MG/ML IV BOLUS
INTRAVENOUS | Status: AC
Start: 2017-01-24 — End: ?
  Filled 2017-01-24: qty 20

## 2017-01-24 MED ORDER — MIDAZOLAM HCL 5 MG/5ML IJ SOLN
INTRAMUSCULAR | Status: DC | PRN
  Administered 2017-01-24: 75 mg via INTRAVENOUS

## 2017-01-24 MED ORDER — ROCURONIUM BROMIDE 50 MG/5ML IV SOSY
PREFILLED_SYRINGE | INTRAVENOUS | Status: DC | PRN
  Administered 2017-01-24: 50 mg via INTRAVENOUS

## 2017-01-24 MED ORDER — FENTANYL CITRATE (PF) 100 MCG/2ML IJ SOLN: 25.0000 ug | INTRAMUSCULAR | Status: DC | PRN

## 2017-01-24 MED ORDER — LIDOCAINE 2% (20 MG/ML) 5 ML SYRINGE
INTRAMUSCULAR | Status: DC | PRN
  Administered 2017-01-24: 60 mg via INTRAVENOUS

## 2017-01-24 MED ORDER — PHENYLEPHRINE 40 MCG/ML (10ML) SYRINGE FOR IV PUSH (FOR BLOOD PRESSURE SUPPORT)
PREFILLED_SYRINGE | INTRAVENOUS | Status: AC
  Filled 2017-01-24: qty 30

## 2017-01-24 MED ORDER — ONDANSETRON HCL 4 MG/2ML IJ SOLN
INTRAMUSCULAR | Status: DC | PRN
  Administered 2017-01-24: 4 mg via INTRAVENOUS

## 2017-01-24 MED ORDER — ACETAMINOPHEN 500 MG PO TABS
1000.0000 mg | ORAL_TABLET | Freq: Once | ORAL | Status: AC
  Administered 2017-01-24: 1000 mg via ORAL
  Filled 2017-01-24: qty 2

## 2017-01-24 MED ORDER — MIDAZOLAM HCL 2 MG/2ML IJ SOLN
INTRAMUSCULAR | Status: AC
  Filled 2017-01-24: qty 2

## 2017-01-24 MED ORDER — PROPOFOL 10 MG/ML IV BOLUS
INTRAVENOUS | Status: DC | PRN
  Administered 2017-01-24: 160 mg via INTRAVENOUS

## 2017-01-24 MED ORDER — PROMETHAZINE HCL 25 MG/ML IJ SOLN: 6.2500 mg | INTRAMUSCULAR | Status: DC | PRN

## 2017-01-24 MED ORDER — SUGAMMADEX SODIUM 200 MG/2ML IV SOLN
INTRAVENOUS | Status: AC
  Filled 2017-01-24: qty 2

## 2017-01-24 MED ORDER — PHENYLEPHRINE HCL 10 MG/ML IJ SOLN
INTRAVENOUS | Status: DC | PRN
  Administered 2017-01-24: 30 ug/min via INTRAVENOUS

## 2017-01-24 MED ORDER — ONDANSETRON HCL 4 MG/2ML IJ SOLN
INTRAMUSCULAR | Status: AC
  Filled 2017-01-24: qty 2

## 2017-01-24 MED ORDER — MEPERIDINE HCL 50 MG/ML IJ SOLN: 6.2500 mg | INTRAMUSCULAR | Status: DC | PRN

## 2017-01-24 MED ORDER — POTASSIUM CHLORIDE 10 MEQ/100ML IV SOLN
10.0000 meq | INTRAVENOUS | Status: AC
  Administered 2017-01-25 (×6): 10 meq via INTRAVENOUS
  Filled 2017-01-24 (×6): qty 100

## 2017-01-24 MED ORDER — LACTATED RINGERS IV SOLN
INTRAVENOUS | Status: DC
  Administered 2017-01-24 (×2): via INTRAVENOUS

## 2017-01-24 MED ORDER — EPHEDRINE SULFATE-NACL 50-0.9 MG/10ML-% IV SOSY
PREFILLED_SYRINGE | INTRAVENOUS | Status: DC | PRN
  Administered 2017-01-24: 10 mg via INTRAVENOUS
  Administered 2017-01-24 (×2): 15 mg via INTRAVENOUS

## 2017-01-24 MED ORDER — SENNA 8.6 MG PO TABS
1.0000 | ORAL_TABLET | Freq: Two times a day (BID) | ORAL | Status: DC
  Administered 2017-01-24 – 2017-01-25 (×2): 8.6 mg via ORAL
  Filled 2017-01-24 (×6): qty 1

## 2017-01-24 SURGICAL SUPPLY — 23 items
BNDG GAUZE ELAST 4 BULKY (GAUZE/BANDAGES/DRESSINGS) ×2 IMPLANT
DRAIN PENROSE 18X1/2 LTX STRL (DRAIN) ×2 IMPLANT
DRAPE EXTREMITY T 121X128X90 (DRAPE) IMPLANT
DRAPE SHEET LG 3/4 BI-LAMINATE (DRAPES) ×3 IMPLANT
ELECT REM PT RETURN 15FT ADLT (MISCELLANEOUS) ×3 IMPLANT
GAUZE SPONGE 4X4 12PLY STRL (GAUZE/BANDAGES/DRESSINGS) ×3 IMPLANT
GLOVE BIOGEL PI IND STRL 7.0 (GLOVE) ×1 IMPLANT
GLOVE BIOGEL PI INDICATOR 7.0 (GLOVE) ×2
GLOVE ECLIPSE 8.0 STRL XLNG CF (GLOVE) ×3 IMPLANT
GLOVE INDICATOR 8.0 STRL GRN (GLOVE) ×6 IMPLANT
GOWN STRL REUS W/ TWL XL LVL3 (GOWN DISPOSABLE) ×3 IMPLANT
GOWN STRL REUS W/TWL LRG LVL3 (GOWN DISPOSABLE) ×3 IMPLANT
GOWN STRL REUS W/TWL XL LVL3 (GOWN DISPOSABLE) ×9
KIT BASIN OR (CUSTOM PROCEDURE TRAY) ×3 IMPLANT
NS IRRIG 1000ML POUR BTL (IV SOLUTION) ×3 IMPLANT
PACK GENERAL/GYN (CUSTOM PROCEDURE TRAY) ×3 IMPLANT
PAD ABD 8X10 STRL (GAUZE/BANDAGES/DRESSINGS) ×4 IMPLANT
STOCKINETTE 8 INCH (MISCELLANEOUS) IMPLANT
SUT ETHILON 2 0 PS N (SUTURE) ×4 IMPLANT
SWAB COLLECTION DEVICE MRSA (MISCELLANEOUS) ×2 IMPLANT
SWAB CULTURE ESWAB REG 1ML (MISCELLANEOUS) ×2 IMPLANT
TOWEL OR 17X26 10 PK STRL BLUE (TOWEL DISPOSABLE) ×3 IMPLANT
UNDERPAD 30X30 (UNDERPADS AND DIAPERS) ×2 IMPLANT

## 2017-01-24 NOTE — Anesthesia Preprocedure Evaluation (Addendum)
Anesthesia Evaluation  Patient identified by MRN, date of birth, ID band Patient awake    Reviewed: Allergy & Precautions, NPO status , Patient's Chart, lab work & pertinent test results  Airway Mallampati: I       Dental  (+) Poor Dentition, Missing, Chipped, Loose   Pulmonary neg pulmonary ROS,    breath sounds clear to auscultation       Cardiovascular hypertension, Pt. on medications + Peripheral Vascular Disease   Rhythm:Regular Rate:Normal     Neuro/Psych Anxiety negative neurological ROS     GI/Hepatic negative GI ROS, Neg liver ROS,   Endo/Other  negative endocrine ROS  Renal/GU negative Renal ROS  negative genitourinary   Musculoskeletal   Abdominal   Peds  Hematology negative hematology ROS (+)   Anesthesia Other Findings   Reproductive/Obstetrics                            Anesthesia Physical Anesthesia Plan  ASA: IV and emergent  Anesthesia Plan: General   Post-op Pain Management:    Induction: Intravenous  PONV Risk Score and Plan: 3 and Ondansetron and Treatment may vary due to age or medical condition  Airway Management Planned: Oral ETT  Additional Equipment: None  Intra-op Plan:   Post-operative Plan: Extubation in OR  Informed Consent: I have reviewed the patients History and Physical, chart, labs and discussed the procedure including the risks, benefits and alternatives for the proposed anesthesia with the patient or authorized representative who has indicated his/her understanding and acceptance.   Dental advisory given  Plan Discussed with: CRNA  Anesthesia Plan Comments: (Critically low K+, will proceed as patient is likely septic and will worsen. Risks will be discussed with patient and mom in detail.  )       Anesthesia Quick Evaluation

## 2017-01-24 NOTE — ED Notes (Signed)
MD AND RN NOTIFIED OF PATIENT'S  K LEVEL OF 2.2

## 2017-01-24 NOTE — ED Triage Notes (Signed)
Patient reports that he has abscess on buttocks and had CT scan done yesterday and was told by PCP to come to hospital for JP drains to put in.  Patient reports that started on Keflex yesterday afternoon then last night started having diarrhea.  Patient adds that this week had worsening appetite.

## 2017-01-24 NOTE — H&P (Addendum)
Anthony [name] is an 47 y.o. male.   Chief Complaint: perirectal abscess HPI:  Pt is a 47 yo M who presents with a 1-2 week history of drainage from his perirectal regions.  He has spina bifida aperta with paraplegia and chronic indwelling suprapubic catheter.  He started having fevers and chills around a week ago.  He was seen in urology yesterday and CT showed bilateral gas and fluid, R >> L.  He was advised to go to the ED yesterday, but it was later in the afternoon and he wanted to wait.  He is quite mobile in the wheelchair despite his paraplegia.  He has some pain, but not a lot in this area.    He is quite hypokalemic in the ED.  He is receiving IV K.  Past Medical History:  Diagnosis Date  . Chronic indwelling Foley catheter   . Spina bifida   . Wheelchair bound     Past Surgical History:  Procedure Laterality Date  . BACK SURGERY    . HIP SURGERY      No family history on file. Social History:  reports that  has never smoked. he has never used smokeless tobacco. He reports that he does not drink alcohol. His drug history is not on file.  Allergies:  Allergies  Allergen Reactions  . Vancomycin Itching   Current Meds  Medication Sig  . Catheters (FOLEY CATHETER 2-WAY) MISC Use as directed by urologist.  . cephALEXin (KEFLEX) 500 MG capsule Take 500 mg by mouth 4 (four) times daily.  Marland Kitchen lisinopril-hydrochlorothiazide (PRINZIDE,ZESTORETIC) 20-25 MG tablet TAKE 1 TABLET BY MOUTH DAILY. TAKE 1/2 TABLET AT BEDTIME.  . Multiple Vitamin (MULTIVITAMIN WITH MINERALS) TABS tablet Take 1 tablet by mouth daily.     Results for orders placed or performed during the hospital encounter of 01/24/17 (from the past 48 hour(s))  CBC with Differential/Platelet     Status: Abnormal   Collection Time: 01/24/17  4:29 PM  Result Value Ref Range   WBC 21.9 (H) 4.0 - 10.5 K/uL   RBC 3.88 (L) 4.22 - 5.81 MIL/uL   Hemoglobin 11.1 (L) 13.0 - 17.0 g/dL   HCT 31.4 (L) 39.0 - 52.0 %   MCV 80.9  78.0 - 100.0 fL   MCH 28.6 26.0 - 34.0 pg   MCHC 35.4 30.0 - 36.0 g/dL   RDW 13.1 11.5 - 15.5 %   Platelets 378 150 - 400 K/uL   Neutrophils Relative % 87 %   Neutro Abs 19.1 (H) 1.7 - 7.7 K/uL   Lymphocytes Relative 4 %   Lymphs Abs 0.8 0.7 - 4.0 K/uL   Monocytes Relative 9 %   Monocytes Absolute 1.9 (H) 0.1 - 1.0 K/uL   Eosinophils Relative 0 %   Eosinophils Absolute 0.1 0.0 - 0.7 K/uL   Basophils Relative 0 %   Basophils Absolute 0.0 0.0 - 0.1 K/uL  Comprehensive metabolic panel     Status: Abnormal   Collection Time: 01/24/17  4:29 PM  Result Value Ref Range   Sodium 129 (L) 135 - 145 mmol/L   Potassium 2.1 (LL) 3.5 - 5.1 mmol/L    Comment: CRITICAL RESULT CALLED TO, READ BACK BY AND VERIFIED WITH: DOWD,P AT 1740 ON [id number] BY HOOKER,B    Chloride 93 (L) 101 - 111 mmol/L   CO2 24 22 - 32 mmol/L   Glucose, Bld 126 (H) 65 - 99 mg/dL   BUN 15 6 - 20 mg/dL   Creatinine,  Ser 0.89 0.61 - 1.24 mg/dL   Calcium 8.1 (L) 8.9 - 10.3 mg/dL   Total Protein 7.0 6.5 - 8.1 g/dL   Albumin 2.5 (L) 3.5 - 5.0 g/dL   AST 21 15 - 41 U/L   ALT 21 17 - 63 U/L   Alkaline Phosphatase 69 38 - 126 U/L   Total Bilirubin 0.6 0.3 - 1.2 mg/dL   GFR calc non Af Amer >60 >60 mL/min   GFR calc Af Amer >60 >60 mL/min    Comment: (NOTE) The eGFR has been calculated using the CKD EPI equation. This calculation has not been validated in all clinical situations. eGFR's persistently <60 mL/min signify possible Chronic Kidney Disease.    Anion gap 12 5 - 15  Magnesium     Status: None   Collection Time: 01/24/17  4:29 PM  Result Value Ref Range   Magnesium 1.7 1.7 - 2.4 mg/dL  I-Stat CG4 Lactic Acid, ED     Status: None   Collection Time: 01/24/17  4:31 PM  Result Value Ref Range   Lactic Acid, Venous 1.23 0.5 - 1.9 mmol/L  I-Stat CG4 Lactic Acid, ED     Status: None   Collection Time: 01/24/17  4:32 PM  Result Value Ref Range   Lactic Acid, Venous 1.24 0.5 - 1.9 mmol/L  I-stat chem 8, ed     Status:  Abnormal   Collection Time: 01/24/17  4:34 PM  Result Value Ref Range   Sodium 131 (L) 135 - 145 mmol/L   Potassium 2.2 (LL) 3.5 - 5.1 mmol/L   Chloride 91 (L) 101 - 111 mmol/L   BUN 13 6 - 20 mg/dL   Creatinine, Ser 0.80 0.61 - 1.24 mg/dL   Glucose, Bld 131 (H) 65 - 99 mg/dL   Calcium, Ion 1.00 (L) 1.15 - 1.40 mmol/L   TCO2 25 22 - 32 mmol/L   Hemoglobin 11.2 (L) 13.0 - 17.0 g/dL   HCT 33.0 (L) 39.0 - 52.0 %   Comment NOTIFIED PHYSICIAN    No results found.  CT abd/pelvis at [facility] urology 01/23/17.  1.  Presumed decubitus ulcer superficial to ischial tuberosities. Fluid and gas collection on the right suspicious for abscess.  Cannot exclude fistula to the base of the penis. 2.  Subcutaneous gas throughout inferior posterior pelvis centered inferiorly to the tip of the coccyc.  Associated with prolapsing anus in this region and fistulous communication can't be excluded 3.  Pelvic adenopathy, probably reactive   Review of Systems  Constitutional: Positive for chills, diaphoresis and fever.  HENT: Negative.   Eyes:       History of blindness with anesthesia  Respiratory: Negative.   Cardiovascular: Negative.   Gastrointestinal: Negative.   Genitourinary:       Chronic indwelling suprapubic catheter.  Musculoskeletal:       Spina bifida aperta   Skin:       Chronic ulcer on perineum  Neurological:       Paraplegia  Endo/Heme/Allergies: Negative.   Psychiatric/Behavioral: Negative.     Blood pressure 101/60, pulse (!) 109, temperature (!) 100.5 F (38.1 C), temperature source Oral, resp. rate 14, weight 77.1 kg (170 lb), SpO2 97 %. Physical Exam  Constitutional: He is oriented to person, place, and time. He appears well-developed and well-nourished. He appears distressed.  HENT:  Head: Normocephalic and atraumatic.  Right Ear: External ear normal.  Left Ear: External ear normal.  Mouth/Throat: Oropharynx is clear and moist.  Eyes: Conjunctivae  are normal. Pupils are  equal, round, and reactive to light. Right eye exhibits no discharge. Left eye exhibits no discharge. No scleral icterus.  Neck: Normal range of motion. No tracheal deviation present.  Cardiovascular: Normal heart sounds and intact distal pulses.  Tachycardic, regular rhythm  Respiratory: Effort normal. No respiratory distress. He exhibits no tenderness.  GI: Soft. He exhibits no distension. There is no tenderness.  Genitourinary:     Genitourinary Comments: Redness bilaterally, right>>left Rectum posteriorly located.  Ulcer more in area of perineum/ischial space.    Musculoskeletal: He exhibits deformity. He exhibits no tenderness.  Contracture at right hip.  Lymphadenopathy:    He has no cervical adenopathy.  Neurological: He is alert and oriented to person, place, and time.  No motor below waist  Skin: No rash noted. He is not diaphoretic. There is erythema. There is pallor.  Psychiatric: He has a normal mood and affect. His behavior is normal. Judgment and thought content normal.     Assessment/Plan Complex ischiorectal abscess. Hypokalemia Hyponatremia Sepsis/SIRS Spina bifida aperta HTN  Plan incision and drainage of abscess.  Anticipate placing penrose drain in order to avoid large defect that will have issues healing.  Will also use pulse lavage.  Will obtain cultures prior to dosing with antibiotics.  ID MD recommended daptomycin and unasyn AFTER cultures obtained.   Will get those in OR.  Fluid resuscitation Will likely need stepdown or ICU bed.  Will see how hemodynamics are intraoperatively. Pt will need aggressive potassium replacement prior to anesthesia.    Stark Klein, MD 01/24/2017, 5:42 PM

## 2017-01-24 NOTE — Anesthesia Procedure Notes (Signed)
Procedure Name: Intubation Performed by: Gean Maidens, CRNA Pre-anesthesia Checklist: Patient identified, Emergency Drugs available, Suction available, Patient being monitored and Timeout performed Patient Re-evaluated:Patient Re-evaluated prior to induction Oxygen Delivery Method: Circle system utilized Preoxygenation: Pre-oxygenation with 100% oxygen Induction Type: IV induction and Rapid sequence Laryngoscope Size: Mac and 4 Grade View: Grade I Tube type: Oral Tube size: 7.5 mm Number of attempts: 1 Airway Equipment and Method: Stylet Placement Confirmation: ETT inserted through vocal cords under direct vision,  positive ETCO2,  CO2 detector and breath sounds checked- equal and bilateral Secured at: 22 cm Tube secured with: Tape Dental Injury: Teeth and Oropharynx as per pre-operative assessment

## 2017-01-24 NOTE — ED Notes (Signed)
Surgeon at bedside.  

## 2017-01-24 NOTE — Progress Notes (Signed)
CRITICAL VALUE ALERT  Critical Value:  Potassium 2.4  Date & Time Notified:  01/24/2017 1120   Provider Notified: Dr. Donell BeersByerly  Orders Received/Actions taken: new orders for KCl replacement received. OK for pt to be transferred to telemetry bed. Ardyth GalAnderson, Lus Kriegel Ann, RN 01/24/2017

## 2017-01-24 NOTE — ED Provider Notes (Signed)
[facility]-EMERGENCY DEPT Provider Note   CSN: [id number] Arrival date & time: 01/24/17  1503     History   Chief Complaint Chief Complaint  Patient presents with  . sent by PCP after CT scans  . abscess drain procedure    HPI NASHTON BELSON is a 47 y.o. male. CC:  Abnormal CT.  HPI:  47 year old male. History of spina bifida. Wheelchair-bound with quadriplegia. Known sacral decubitus. States for at least last several days maybe as long as few weeks he is felt poorly. No shakes or chills. Seen by urology yesterday had outpatient CT scan showing extensive right gluteal abscess with subcutaneous air, possible perirectal extension and possible right ischial extension. He was referred to the emergency room yesterday. He presents today.  Past Medical History:  Diagnosis Date  . Chronic indwelling Foley catheter   . Spina bifida   . Wheelchair bound     Patient Active Problem List   Diagnosis Date Noted  . Hyperlipidemia 03/11/2014  . Spina bifida aperta of lumbar spine (HCC) 09/30/2012  . Routine adult health maintenance 03/26/2012  . Shoulder pain, bilateral 01/27/2011  . ASTHMA, PERSISTENT 06/30/2007  . Essential hypertension 06/01/2007  . ALLERGIC RHINITIS 06/01/2007  . DYSPNEA 06/01/2007  . ANXIETY 03/19/2006  . 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 VENOUS INSUFFICIENCY, CHRONIC 03/19/2006  . Pressure ulcer 03/19/2006    Past Surgical History:  Procedure Laterality Date  . BACK SURGERY    . HIP SURGERY         Home Medications    Prior to Admission medications   Medication Sig Start Date End Date Taking? Authorizing Provider  Catheters (FOLEY CATHETER 2-WAY) MISC Use as directed by urologist. 12/30/12  Yes Leona Singleton, MD  cephALEXin (KEFLEX) 500 MG capsule Take 500 mg by mouth 4 (four) times daily.   Yes [provider]  lisinopril-hydrochlorothiazide (PRINZIDE,ZESTORETIC) 20-25 MG tablet TAKE 1 TABLET BY MOUTH DAILY.  TAKE 1/2 TABLET AT BEDTIME. 11/10/16  Yes Mikell, Antionette Poles, MD  Multiple Vitamin (MULTIVITAMIN WITH MINERALS) TABS tablet Take 1 tablet by mouth daily.   Yes [provider]    Family History No family history on file.  Social History Social History   Tobacco Use  . Smoking status: Never Smoker  . Smokeless tobacco: Never Used  Substance Use Topics  . Alcohol use: No    Frequency: Never  . Drug use: Not on file     Allergies   Vancomycin   Review of Systems Review of Systems  Constitutional: Positive for fatigue. Negative for appetite change, chills, diaphoresis and fever.  HENT: Negative for mouth sores, sore throat and trouble swallowing.   Eyes: Negative for visual disturbance.  Respiratory: Negative for cough, chest tightness, shortness of breath and wheezing.   Cardiovascular: Negative for chest pain.  Gastrointestinal: Positive for diarrhea. Negative for abdominal distention, abdominal pain, nausea and vomiting.  Endocrine: Negative for polydipsia, polyphagia and polyuria.  Genitourinary: Negative for dysuria, frequency and hematuria.  Musculoskeletal: Negative for gait problem.  Skin: Negative for color change, pallor and rash.  Neurological: Positive for weakness. Negative for dizziness, syncope, light-headedness and headaches.  Hematological: Does not bruise/bleed easily.  Psychiatric/Behavioral: Negative for behavioral problems and confusion.     Physical Exam Updated Vital Signs BP 101/60   Pulse (!) 109   Temp (!) 100.5 F (38.1 C) (Oral)   Resp 14   Wt 77.1 kg (170 lb)   SpO2 97%   Physical Exam  Constitutional: He is oriented to person, place, and time. He appears well-developed and well-nourished. No distress.  Awake and alert. Talkative. Tachycardic at 120 febrile 101.3 oral.  HENT:  Head: Normocephalic.  Eyes: Conjunctivae are normal. Pupils are equal, round, and reactive to light. No scleral icterus.  Neck: Normal range of  motion. Neck supple. No thyromegaly present.  Cardiovascular: Normal rate and regular rhythm. Exam reveals no gallop and no friction rub.  No murmur heard. Pulmonary/Chest: Effort normal and breath sounds normal. No respiratory distress. He has no wheezes. He has no rales.  Abdominal: Soft. Bowel sounds are normal. He exhibits no distension. There is no tenderness. There is no rebound.  Genitourinary:  Genitourinary Comments: Abnormal positioning of the sacrum. Rectum appears normal. Scrotum intact without subcutaneous air or inflammation. Extensive induration of the right inferior gluteal region. Extends to the rectum and superior laterally towards the hip. Large approximately 5 sooner diameter sacral decubitus full-thickness  Musculoskeletal: Normal range of motion.  Neurological: He is alert and oriented to person, place, and time.  Skin: Skin is warm and dry. No rash noted.  Psychiatric: He has a normal mood and affect. His behavior is normal.     ED Treatments / Results  Labs (all labs ordered are listed, but only abnormal results are displayed) Labs Reviewed  CBC WITH DIFFERENTIAL/PLATELET - Abnormal; Notable for the following components:      Result Value   WBC 21.9 (*)    RBC 3.88 (*)    Hemoglobin 11.1 (*)    HCT 31.4 (*)    Neutro Abs 19.1 (*)    Monocytes Absolute 1.9 (*)    All other components within normal limits  I-STAT CHEM 8, ED - Abnormal; Notable for the following components:   Sodium 131 (*)    Potassium 2.2 (*)    Chloride 91 (*)    Glucose, Bld 131 (*)    Calcium, Ion 1.00 (*)    Hemoglobin 11.2 (*)    HCT 33.0 (*)    All other components within normal limits  URINE CULTURE  CULTURE, BLOOD (ROUTINE X 2)  CULTURE, BLOOD (ROUTINE X 2)  MAGNESIUM  COMPREHENSIVE METABOLIC PANEL  URINALYSIS, ROUTINE W REFLEX MICROSCOPIC  I-STAT CG4 LACTIC ACID, ED  I-STAT CG4 LACTIC ACID, ED    EKG  EKG Interpretation None       Radiology No results  found.  Procedures Procedures (including critical care time)  Medications Ordered in ED Medications  potassium chloride 10 mEq in 100 mL IVPB (10 mEq Intravenous New Bag/Given 01/24/17 1648)  sodium chloride 0.9 % bolus 1,000 mL (1,000 mLs Intravenous New Bag/Given 01/24/17 1640)  0.9 %  sodium chloride infusion (100 mL/hr Intravenous New Bag/Given 01/24/17 1640)  acetaminophen (TYLENOL) tablet 1,000 mg (1,000 mg Oral Given 01/24/17 1648)     Initial Impression / Assessment and Plan / ED Course  I have reviewed the triage vital signs and the nursing notes.  Pertinent labs & imaging results that were available during my care of the patient were reviewed by me and considered in my medical decision making (see chart for details).    Labs obtained. Given by mouth Tylenol otherwise nothing by mouth. Cultures obtained. I discussed the case with Dr. Berneice HeinrichManny urology, Dr. Donell BeersByerly of surgery, Dr. Luciana Axeomer of infectious disease. We will hold on antibiotics until surgical aspirate is obtained. Dr. Luciana Axeomer recommends Unasyn standard dosing, and daptomycin 6 mg/kg/day,  after surgical aspirate, and adjusted per Gram stain and cultures.  Final Clinical Impressions(s) / ED Diagnoses   Final diagnoses:  Gluteal abscess    ED Discharge Orders    None       Rolland Porter, MD 01/24/17 1735

## 2017-01-24 NOTE — Anesthesia Postprocedure Evaluation (Signed)
Anesthesia Post Note  Patient: Clois Comberonald A Yingst  Procedure(s) Performed: IRRIGATION AND DEBRIDEMENT WOUND- BUTTOCK ABSCESS (N/A )     Patient location during evaluation: PACU Anesthesia Type: General Level of consciousness: awake and alert Pain management: pain level controlled Vital Signs Assessment: post-procedure vital signs reviewed and stable Respiratory status: spontaneous breathing, nonlabored ventilation, respiratory function stable and patient connected to nasal cannula oxygen Cardiovascular status: blood pressure returned to baseline and stable Postop Assessment: no apparent nausea or vomiting Anesthetic complications: no    Last Vitals:  Vitals:   01/24/17 1753 01/24/17 2036  BP: 101/60   Pulse: (!) 110   Resp: 15 12  Temp: (!) 38.1 C 36.8 C  SpO2: 98%     Last Pain:  Vitals:   01/24/17 2100  TempSrc:   PainSc: 0-No pain                 Shelton SilvasKevin D Klaudia Beirne

## 2017-01-24 NOTE — ED Notes (Signed)
ED Provider at bedside. 

## 2017-01-24 NOTE — Op Note (Signed)
Incision and Drainage Complex Ischiorectal abscess  Pre-operative Diagnosis: ischiorectal abscess   Post-operative Diagnosis: same   Indications: Pt is a 47 yo M with paraplegia secondary to spina bifida who presents with fever, tachycardia, and abscess seen on CT yesterday in urology office.    Anesthesia: General   Procedure Details  The procedure, risks and complications have been discussed in detail (including, infection, bleeding, need for additional procedures) with the patient, and the patient has signed consent to the procedure. The patient was informed that the wound would be left open with a probable penrose drain. The patient was taken to OR 1 and general anesthesia was induced. The patient was placed into left lateral decubitus position.  The skin was sterilely prepped and draped over the affected area in the usual fashion. Time out was performed according to the surgical safety checklist.   A syringe with needle was used to sound the abscess.  Pus was obtained.  A 2 cm incision was made with a #11 blade just cranial to the ischium.  There was pus around 1 cm from the surface of the skin. Cultures were sent.  This cavity tracked up toward the coccyx cranially and toward the scrotum caudally. Additional skin was debrided around the opening and the incision lengthened to around 4 cm. Purulent drainage was present. The pulse lavage was used to irrigate the cavity. A 1 inch penrose was selected.  A portion was placed superiorly and tacked down with two 2-0 nylons.  A second portion was placed toward the scrotum and packed into place.  The wound was packed loosely with kerlix.    The left side was sounded with a fresh needle and syring and no purulent drainage was obtained. The chronically open area that is epithelialized tracked laterally to the left consistent with the air seen on the left on the CT.    Needle, sponge, and instrument counts were correct x 2.    The patient was awakened  from anesthesia and taken to the PACU in stable condition.   Findings:  Foul smelling purulent drainage.  Stat gram stain showed abundant PMNs, GNRs, and GPCs   EBL: min cc's   Drains: None   Condition: Tolerated procedure well   Complications:  none known.

## 2017-01-25 LAB — CBC
HCT: 28.2 % — ABNORMAL LOW (ref 39.0–52.0)
HEMOGLOBIN: 9.6 g/dL — AB (ref 13.0–17.0)
MCH: 28.2 pg (ref 26.0–34.0)
MCHC: 34 g/dL (ref 30.0–36.0)
MCV: 82.7 fL (ref 78.0–100.0)
Platelets: 339 10*3/uL (ref 150–400)
RBC: 3.41 MIL/uL — AB (ref 4.22–5.81)
RDW: 13.6 % (ref 11.5–15.5)
WBC: 18.7 10*3/uL — AB (ref 4.0–10.5)

## 2017-01-25 LAB — CK: Total CK: 35 U/L — ABNORMAL LOW (ref 49–397)

## 2017-01-25 LAB — GLUCOSE, CAPILLARY: Glucose-Capillary: 129 mg/dL — ABNORMAL HIGH (ref 65–99)

## 2017-01-25 MED ORDER — DIPHENHYDRAMINE HCL 50 MG/ML IJ SOLN: 12.5000 mg | Freq: Four times a day (QID) | INTRAMUSCULAR | Status: DC | PRN

## 2017-01-25 MED ORDER — ZOLPIDEM TARTRATE 5 MG PO TABS
5.0000 mg | ORAL_TABLET | Freq: Every evening | ORAL | Status: DC | PRN
  Administered 2017-01-26 – 2017-01-27 (×2): 5 mg via ORAL
  Filled 2017-01-25 (×2): qty 1

## 2017-01-25 MED ORDER — SODIUM CHLORIDE 0.9 % IV SOLN
500.0000 mg | INTRAVENOUS | Status: DC
  Administered 2017-01-25 – 2017-02-01 (×8): 500 mg via INTRAVENOUS
  Filled 2017-01-25 (×8): qty 10

## 2017-01-25 MED ORDER — METOPROLOL TARTRATE 5 MG/5ML IV SOLN: 5.0000 mg | Freq: Four times a day (QID) | INTRAVENOUS | Status: DC | PRN

## 2017-01-25 MED ORDER — HYDROMORPHONE HCL 1 MG/ML IJ SOLN: 0.5000 mg | INTRAMUSCULAR | Status: DC | PRN

## 2017-01-25 MED ORDER — DIPHENHYDRAMINE HCL 12.5 MG/5ML PO ELIX
12.5000 mg | ORAL_SOLUTION | Freq: Four times a day (QID) | ORAL | Status: DC | PRN
  Administered 2017-01-28 – 2017-01-31 (×4): 12.5 mg via ORAL
  Filled 2017-01-25 (×4): qty 5

## 2017-01-25 MED ORDER — ENOXAPARIN SODIUM 40 MG/0.4ML ~~LOC~~ SOLN
40.0000 mg | SUBCUTANEOUS | Status: DC
  Administered 2017-01-25: 40 mg via SUBCUTANEOUS
  Filled 2017-01-25 (×5): qty 0.4

## 2017-01-25 MED ORDER — ACETAMINOPHEN 650 MG RE SUPP: 650.0000 mg | Freq: Four times a day (QID) | RECTAL | Status: DC | PRN

## 2017-01-25 MED ORDER — KETOROLAC TROMETHAMINE 30 MG/ML IJ SOLN
30.0000 mg | Freq: Four times a day (QID) | INTRAMUSCULAR | Status: AC | PRN
  Administered 2017-01-27: 30 mg via INTRAVENOUS
  Filled 2017-01-25: qty 1

## 2017-01-25 MED ORDER — ACETAMINOPHEN 325 MG PO TABS
650.0000 mg | ORAL_TABLET | Freq: Four times a day (QID) | ORAL | Status: DC | PRN
  Administered 2017-01-25 – 2017-02-03 (×13): 650 mg via ORAL
  Filled 2017-01-25 (×13): qty 2

## 2017-01-25 MED ORDER — OXYCODONE HCL 5 MG PO TABS: 5.0000 mg | ORAL_TABLET | ORAL | Status: DC | PRN

## 2017-01-25 MED ORDER — SODIUM CHLORIDE 0.9 % IV SOLN
3.0000 g | Freq: Four times a day (QID) | INTRAVENOUS | Status: DC
  Administered 2017-01-25 – 2017-02-02 (×28): 3 g via INTRAVENOUS
  Filled 2017-01-25 (×34): qty 3

## 2017-01-25 MED ORDER — ONDANSETRON HCL 4 MG/2ML IJ SOLN
4.0000 mg | Freq: Four times a day (QID) | INTRAMUSCULAR | Status: DC | PRN
  Administered 2017-01-28: 4 mg via INTRAVENOUS
  Filled 2017-01-25: qty 2

## 2017-01-25 MED ORDER — KETOROLAC TROMETHAMINE 30 MG/ML IJ SOLN
30.0000 mg | Freq: Four times a day (QID) | INTRAMUSCULAR | Status: AC
  Administered 2017-01-25 (×4): 30 mg via INTRAVENOUS
  Filled 2017-01-25 (×4): qty 1

## 2017-01-25 MED ORDER — ONDANSETRON 4 MG PO TBDP: 4.0000 mg | ORAL_TABLET | Freq: Four times a day (QID) | ORAL | Status: DC | PRN

## 2017-01-25 MED ORDER — METHOCARBAMOL 500 MG PO TABS: 500.0000 mg | ORAL_TABLET | Freq: Four times a day (QID) | ORAL | Status: DC | PRN

## 2017-01-25 NOTE — Progress Notes (Signed)
Patient ID: Anthony [name], male   DOB: [birth year], 47 y.o.   MRN: [id number] [facility] Surgery Progress Note:   1 Day Post-Op  Subjective: Mental status is clear.  He is feeling much better post drainage Objective: Vital signs in last 24 hours: Temp:  [98.3 F (36.8 C)-101.5 F (38.6 C)] 99.4 F (37.4 C) (01/06 0457) Pulse Rate:  [93-130] 107 (01/06 0457) Resp:  [10-31] 22 (01/06 0457) BP: (100-149)/(53-126) 100/56 (01/06 0531) SpO2:  [95 %-100 %] 100 % (01/06 0457) Weight:  [77.1 kg (170 lb)-82 kg (180 lb 12.4 oz)] 82 kg (180 lb 12.4 oz) (01/06 0531)  Intake/Output from previous day: 01/05 0701 - 01/06 0700 In: 4298.3 [I.V.:2448.3; IV Piggyback:1850] Out: 3300 [Urine:3275; Blood:25] Intake/Output this shift: Total I/O In: 360 [P.O.:360] Out: -   Physical Exam: Work of breathing is normal.  Dressings and packing as per surgery.     Lab Results:  Results for orders placed or performed during the hospital encounter of 01/24/17 (from the past 48 hour(s))  Urinalysis, Routine w reflex microscopic     Status: Abnormal   Collection Time: 01/24/17  4:00 PM  Result Value Ref Range   Color, Urine YELLOW YELLOW   APPearance CLEAR CLEAR   Specific Gravity, Urine 1.004 (L) 1.005 - 1.030   pH 6.0 5.0 - 8.0   Glucose, UA NEGATIVE NEGATIVE mg/dL   Hgb urine dipstick SMALL (A) NEGATIVE   Bilirubin Urine NEGATIVE NEGATIVE   Ketones, ur NEGATIVE NEGATIVE mg/dL   Protein, ur NEGATIVE NEGATIVE mg/dL   Nitrite NEGATIVE NEGATIVE   Leukocytes, UA NEGATIVE NEGATIVE   RBC / HPF 0-5 0 - 5 RBC/hpf   WBC, UA 0-5 0 - 5 WBC/hpf   Bacteria, UA RARE (A) NONE SEEN   Squamous Epithelial / LPF 0-5 (A) NONE SEEN  CBC with Differential/Platelet     Status: Abnormal   Collection Time: 01/24/17  4:29 PM  Result Value Ref Range   WBC 21.9 (H) 4.0 - 10.5 K/uL   RBC 3.88 (L) 4.22 - 5.81 MIL/uL   Hemoglobin 11.1 (L) 13.0 - 17.0 g/dL   HCT 31.4 (L) 39.0 - 52.0 %   MCV 80.9 78.0 - 100.0 fL   MCH  28.6 26.0 - 34.0 pg   MCHC 35.4 30.0 - 36.0 g/dL   RDW 13.1 11.5 - 15.5 %   Platelets 378 150 - 400 K/uL   Neutrophils Relative % 87 %   Neutro Abs 19.1 (H) 1.7 - 7.7 K/uL   Lymphocytes Relative 4 %   Lymphs Abs 0.8 0.7 - 4.0 K/uL   Monocytes Relative 9 %   Monocytes Absolute 1.9 (H) 0.1 - 1.0 K/uL   Eosinophils Relative 0 %   Eosinophils Absolute 0.1 0.0 - 0.7 K/uL   Basophils Relative 0 %   Basophils Absolute 0.0 0.0 - 0.1 K/uL  Comprehensive metabolic panel     Status: Abnormal   Collection Time: 01/24/17  4:29 PM  Result Value Ref Range   Sodium 129 (L) 135 - 145 mmol/L   Potassium 2.1 (LL) 3.5 - 5.1 mmol/L    Comment: CRITICAL RESULT CALLED TO, READ BACK BY AND VERIFIED WITH: DOWD,P AT 1740 ON [id number] BY HOOKER,B    Chloride 93 (L) 101 - 111 mmol/L   CO2 24 22 - 32 mmol/L   Glucose, Bld 126 (H) 65 - 99 mg/dL   BUN 15 6 - 20 mg/dL   Creatinine, Ser 0.89 0.61 - 1.24 mg/dL  Calcium 8.1 (L) 8.9 - 10.3 mg/dL   Total Protein 7.0 6.5 - 8.1 g/dL   Albumin 2.5 (L) 3.5 - 5.0 g/dL   AST 21 15 - 41 U/L   ALT 21 17 - 63 U/L   Alkaline Phosphatase 69 38 - 126 U/L   Total Bilirubin 0.6 0.3 - 1.2 mg/dL   GFR calc non Af Amer >60 >60 mL/min   GFR calc Af Amer >60 >60 mL/min    Comment: (NOTE) The eGFR has been calculated using the CKD EPI equation. This calculation has not been validated in all clinical situations. eGFR's persistently <60 mL/min signify possible Chronic Kidney Disease.    Anion gap 12 5 - 15  Magnesium     Status: None   Collection Time: 01/24/17  4:29 PM  Result Value Ref Range   Magnesium 1.7 1.7 - 2.4 mg/dL  I-Stat CG4 Lactic Acid, ED     Status: None   Collection Time: 01/24/17  4:31 PM  Result Value Ref Range   Lactic Acid, Venous 1.23 0.5 - 1.9 mmol/L  I-Stat CG4 Lactic Acid, ED     Status: None   Collection Time: 01/24/17  4:32 PM  Result Value Ref Range   Lactic Acid, Venous 1.24 0.5 - 1.9 mmol/L  I-stat chem 8, ed     Status: Abnormal   Collection  Time: 01/24/17  4:34 PM  Result Value Ref Range   Sodium 131 (L) 135 - 145 mmol/L   Potassium 2.2 (LL) 3.5 - 5.1 mmol/L   Chloride 91 (L) 101 - 111 mmol/L   BUN 13 6 - 20 mg/dL   Creatinine, Ser 0.80 0.61 - 1.24 mg/dL   Glucose, Bld 131 (H) 65 - 99 mg/dL   Calcium, Ion 1.00 (L) 1.15 - 1.40 mmol/L   TCO2 25 22 - 32 mmol/L   Hemoglobin 11.2 (L) 13.0 - 17.0 g/dL   HCT 33.0 (L) 39.0 - 52.0 %   Comment NOTIFIED PHYSICIAN   Aerobic/Anaerobic Culture (surgical/deep wound)     Status: None (Preliminary result)   Collection Time: 01/24/17  8:08 PM  Result Value Ref Range   Specimen Description ABSCESS    Special Requests NONE    Gram Stain      ABUNDANT WBC PRESENT, PREDOMINANTLY PMN ABUNDANT GRAM NEGATIVE RODS GRAM POSITIVE COCCI Gram Stain Report Called to,Read Back By and Verified With: DR.BYERLY,F [id number] A.QUIZON    Culture PENDING    Report Status PENDING   Basic metabolic panel     Status: Abnormal   Collection Time: 01/24/17 10:19 PM  Result Value Ref Range   Sodium 137 135 - 145 mmol/L   Potassium 2.4 (LL) 3.5 - 5.1 mmol/L    Comment: CRITICAL RESULT CALLED TO, READ BACK BY AND VERIFIED WITH: L.Ouida Sills RN [id number] A.QUIZON    Chloride 103 101 - 111 mmol/L   CO2 24 22 - 32 mmol/L   Glucose, Bld 116 (H) 65 - 99 mg/dL   BUN 10 6 - 20 mg/dL   Creatinine, Ser 0.69 0.61 - 1.24 mg/dL   Calcium 7.5 (L) 8.9 - 10.3 mg/dL   GFR calc non Af Amer >60 >60 mL/min   GFR calc Af Amer >60 >60 mL/min    Comment: (NOTE) The eGFR has been calculated using the CKD EPI equation. This calculation has not been validated in all clinical situations. eGFR's persistently <60 mL/min signify possible Chronic Kidney Disease.    Anion gap 10 5 - 15  CBC     Status: Abnormal   Collection Time: 01/25/17  6:10 AM  Result Value Ref Range   WBC 18.7 (H) 4.0 - 10.5 K/uL   RBC 3.41 (L) 4.22 - 5.81 MIL/uL   Hemoglobin 9.6 (L) 13.0 - 17.0 g/dL   HCT 28.2 (L) 39.0 - 52.0 %   MCV 82.7 78.0 - 100.0  fL   MCH 28.2 26.0 - 34.0 pg   MCHC 34.0 30.0 - 36.0 g/dL   RDW 13.6 11.5 - 15.5 %   Platelets 339 150 - 400 K/uL  Basic metabolic panel     Status: Abnormal   Collection Time: 01/25/17  6:10 AM  Result Value Ref Range   Sodium 139 135 - 145 mmol/L   Potassium 2.7 (LL) 3.5 - 5.1 mmol/L    Comment: CRITICAL RESULT CALLED TO, READ BACK BY AND VERIFIED WITH: K NEWSOME,RN 01/26/16 0742 RHOLMES    Chloride 107 101 - 111 mmol/L   CO2 25 22 - 32 mmol/L   Glucose, Bld 158 (H) 65 - 99 mg/dL   BUN 7 6 - 20 mg/dL   Creatinine, Ser 0.63 0.61 - 1.24 mg/dL   Calcium 7.5 (L) 8.9 - 10.3 mg/dL   GFR calc non Af Amer >60 >60 mL/min   GFR calc Af Amer >60 >60 mL/min    Comment: (NOTE) The eGFR has been calculated using the CKD EPI equation. This calculation has not been validated in all clinical situations. eGFR's persistently <60 mL/min signify possible Chronic Kidney Disease.    Anion gap 7 5 - 15  CK     Status: Abnormal   Collection Time: 01/25/17  6:10 AM  Result Value Ref Range   Total CK 35 (L) 49 - 397 U/L  Glucose, capillary     Status: Abnormal   Collection Time: 01/25/17  7:47 AM  Result Value Ref Range   Glucose-Capillary 129 (H) 65 - 99 mg/dL   Comment 1 Notify RN     Radiology/Results: No results found.  Anti-infectives: Anti-infectives (From admission, onward)   Start     Dose/Rate Route Frequency Ordered Stop   01/25/17 2000  DAPTOmycin (CUBICIN) 460 mg in sodium chloride 0.9 % IVPB  Status:  Discontinued     460 mg 218.4 mL/hr over 30 Minutes Intravenous Every 24 hours 01/24/17 2202 01/25/17 1007   01/25/17 2000  DAPTOmycin (CUBICIN) 500 mg in sodium chloride 0.9 % IVPB     500 mg 220 mL/hr over 30 Minutes Intravenous Every 24 hours 01/25/17 1007     01/25/17 0600  Ampicillin-Sulbactam (UNASYN) 3 g in sodium chloride 0.9 % 100 mL IVPB     3 g 200 mL/hr over 30 Minutes Intravenous Every 6 hours 01/25/17 0437     01/24/17 2030  DAPTOmycin (CUBICIN) 460 mg in sodium  chloride 0.9 % IVPB     460 mg 218.4 mL/hr over 30 Minutes Intravenous STAT 01/24/17 2016 01/24/17 2130   01/24/17 2015  Ampicillin-Sulbactam (UNASYN) 3 g in sodium chloride 0.9 % 100 mL IVPB     3 g 200 mL/hr over 30 Minutes Intravenous STAT 01/24/17 2014 01/24/17 2110      Assessment/Plan: Problem List: Patient Active Problem List   Diagnosis Date Noted  . Ischiorectal abscess 01/24/2017  . Status post debridement 01/24/2017  . Hyperlipidemia 03/11/2014  . Spina bifida aperta of lumbar spine ([place]) 09/30/2012  . Routine adult health maintenance 03/26/2012  . Shoulder pain, bilateral 01/27/2011  . ASTHMA, PERSISTENT 06/30/2007  .  Essential hypertension 06/01/2007  . ALLERGIC RHINITIS 06/01/2007  . DYSPNEA 06/01/2007  . ANXIETY 03/19/2006  . 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 VENOUS INSUFFICIENCY, CHRONIC 03/19/2006  . Pressure ulcer 03/19/2006    Will discuss followup imaging of the other cavities as per Dr. Marlowe Aschoff op note.   1 Day Post-Op    LOS: 1 day   Matt B. Hassell Done, MD, [facility] [phone] beeper [phone]  01/25/2017 10:37 AM

## 2017-01-25 NOTE — Progress Notes (Signed)
Pharmacy Antibiotic Note  Anthony ComberDonald A [name] is a 47 y.o. male admitted on 01/24/2017 with cellulitis.  Pharmacy has been consulted for Unasyn dosing.  Plan: Unasyn 3gm iv q6hr  Weight: 170 lb (77.1 kg)  Temp (24hrs), Avg:99.5 F (37.5 C), Min:98.3 F (36.8 C), Max:101.5 F (38.6 C)  Recent Labs  Lab 01/24/17 1629 01/24/17 1631 01/24/17 1632 01/24/17 1634 01/24/17 2219  WBC 21.9*  --   --   --   --   CREATININE 0.89  --   --  0.80 0.69  LATICACIDVEN  --  1.23 1.24  --   --     CrCl cannot be calculated (Unknown ideal weight.).    Allergies  Allergen Reactions  . Vancomycin Itching    Antimicrobials this admission: Unasyn 01/24/2017 >> Daptomycin 01/24/2017 >>  Dose adjustments this admission: -  Microbiology results: pending  Thank you for allowing pharmacy to be a part of this patient's care.  Anthony [name], Anthony [name] 01/25/2017 4:37 AM

## 2017-01-25 NOTE — Plan of Care (Signed)
  Pain Managment: General experience of comfort will improve 01/25/2017 0253 - Progressing by Antionette CharPeng, Alezandra Egli P, RN

## 2017-01-26 ENCOUNTER — Encounter (HOSPITAL_COMMUNITY): Payer: Self-pay | Admitting: General Surgery

## 2017-01-26 ENCOUNTER — Inpatient Hospital Stay (HOSPITAL_COMMUNITY): Payer: Medicare Other

## 2017-01-26 LAB — BASIC METABOLIC PANEL
ANION GAP: 7 (ref 5–15)
Anion gap: 5 (ref 5–15)
BUN: 7 mg/dL (ref 6–20)
BUN: 6 mg/dL (ref 6–20)
CHLORIDE: 107 mmol/L (ref 101–111)
CHLORIDE: 112 mmol/L — AB (ref 101–111)
CO2: 25 mmol/L (ref 22–32)
CO2: 23 mmol/L (ref 22–32)
CREATININE: 0.63 mg/dL (ref 0.61–1.24)
CREATININE: 0.62 mg/dL (ref 0.61–1.24)
Calcium: 7.5 mg/dL — ABNORMAL LOW (ref 8.9–10.3)
Calcium: 7.7 mg/dL — ABNORMAL LOW (ref 8.9–10.3)
GFR calc Af Amer: >60 mL/min (ref >60)
GFR calc non Af Amer: >60 mL/min (ref >60)
GFR calc non Af Amer: >60 mL/min (ref >60)
GLUCOSE: 135 mg/dL — AB (ref 65–99)
Glucose, Bld: 158 mg/dL — ABNORMAL HIGH (ref 65–99)
POTASSIUM: 3.8 mmol/L (ref 3.5–5.1)
Potassium: 2.7 mmol/L — CL (ref 3.5–5.1)
SODIUM: 139 mmol/L (ref 135–145)
Sodium: 140 mmol/L (ref 135–145)

## 2017-01-26 LAB — URINE CULTURE

## 2017-01-26 LAB — CBC
HCT: 25.6 % — ABNORMAL LOW (ref 39.0–52.0)
Hemoglobin: 8.7 g/dL — ABNORMAL LOW (ref 13.0–17.0)
MCH: 28.3 pg (ref 26.0–34.0)
MCHC: 34 g/dL (ref 30.0–36.0)
MCV: 83.4 fL (ref 78.0–100.0)
Platelets: 408 10*3/uL — ABNORMAL HIGH (ref 150–400)
RBC: 3.07 MIL/uL — AB (ref 4.22–5.81)
RDW: 14.1 % (ref 11.5–15.5)
WBC: 12.3 10*3/uL — ABNORMAL HIGH (ref 4.0–10.5)

## 2017-01-26 MED ORDER — IOPAMIDOL (ISOVUE-300) INJECTION 61%
INTRAVENOUS | Status: AC
  Filled 2017-01-26: qty 30

## 2017-01-26 MED ORDER — PRO-STAT SUGAR FREE PO LIQD
30.0000 mL | Freq: Two times a day (BID) | ORAL | Status: DC
  Administered 2017-01-26 – 2017-01-30 (×8): 30 mL via ORAL
  Filled 2017-01-26 (×6): qty 30

## 2017-01-26 MED ORDER — IOPAMIDOL (ISOVUE-300) INJECTION 61%: 15.0000 mL | Freq: Two times a day (BID) | INTRAVENOUS | Status: DC | PRN

## 2017-01-26 MED ORDER — IOPAMIDOL (ISOVUE-300) INJECTION 61%
INTRAVENOUS | Status: AC
  Filled 2017-01-26: qty 100

## 2017-01-26 MED ORDER — ADULT MULTIVITAMIN W/MINERALS CH
1.0000 | ORAL_TABLET | Freq: Every day | ORAL | Status: DC
  Administered 2017-01-26 – 2017-02-03 (×9): 1 via ORAL
  Filled 2017-01-26 (×9): qty 1

## 2017-01-26 MED ORDER — IOPAMIDOL (ISOVUE-300) INJECTION 61%
100.0000 mL | Freq: Once | INTRAVENOUS | Status: AC | PRN
  Administered 2017-01-26: 100 mL via INTRAVENOUS

## 2017-01-26 NOTE — Progress Notes (Signed)
2 Days Post-Op   Subjective/Chief Complaint: No complaints   Objective: Vital signs in last 24 hours: Temp:  [98.5 F (36.9 C)-101.2 F (38.4 C)] 101.2 F (38.4 C) (01/07 0456) Pulse Rate:  [90-108] 108 (01/07 0456) Resp:  [18] 18 (01/07 0456) BP: (90-109)/(38-54) 107/54 (01/07 0456) SpO2:  [97 %-100 %] 100 % (01/07 0456) Last BM Date: 01/25/17  Intake/Output from previous day: 01/06 0701 - 01/07 0700 In: 3646.7 [P.O.:840; I.V.:2296.7; IV Piggyback:510] Out: 1952 [Urine:1950; Stool:2] Intake/Output this shift: Total I/O In: 720 [P.O.:720] Out: -   General appearance: alert and cooperative Resp: clear to auscultation bilaterally Cardio: regular rate and rhythm GI: soft, rectal wound stable  Lab Results:  Recent Labs    01/25/17 0610 01/26/17 0624  WBC 18.7* 12.3*  HGB 9.6* 8.7*  HCT 28.2* 25.6*  PLT 339 408*   BMET Recent Labs    01/25/17 0610 01/26/17 0624  NA 139 140  K 2.7* 3.8  CL 107 112*  CO2 25 23  GLUCOSE 158* 135*  BUN 7 6  CREATININE 0.63 0.62  CALCIUM 7.5* 7.7*   PT/INR No results for input(s): LABPROT, INR in the last 72 hours. ABG No results for input(s): PHART, HCO3 in the last 72 hours.  Invalid input(s): PCO2, PO2  Studies/Results: No results found.  Anti-infectives: Anti-infectives (From admission, onward)   Start     Dose/Rate Route Frequency Ordered Stop   01/25/17 2000  DAPTOmycin (CUBICIN) 460 mg in sodium chloride 0.9 % IVPB  Status:  Discontinued     460 mg 218.4 mL/hr over 30 Minutes Intravenous Every 24 hours 01/24/17 2202 01/25/17 1007   01/25/17 2000  DAPTOmycin (CUBICIN) 500 mg in sodium chloride 0.9 % IVPB     500 mg 220 mL/hr over 30 Minutes Intravenous Every 24 hours 01/25/17 1007     01/25/17 0600  Ampicillin-Sulbactam (UNASYN) 3 g in sodium chloride 0.9 % 100 mL IVPB     3 g 200 mL/hr over 30 Minutes Intravenous Every 6 hours 01/25/17 0437     01/24/17 2030  DAPTOmycin (CUBICIN) 460 mg in sodium chloride 0.9  % IVPB     460 mg 218.4 mL/hr over 30 Minutes Intravenous STAT 01/24/17 2016 01/24/17 2130   01/24/17 2015  Ampicillin-Sulbactam (UNASYN) 3 g in sodium chloride 0.9 % 100 mL IVPB     3 g 200 mL/hr over 30 Minutes Intravenous STAT 01/24/17 2014 01/24/17 2110      Assessment/Plan: s/p Procedure(s): IRRIGATION AND DEBRIDEMENT WOUND- BUTTOCK ABSCESS (N/A) Advance diet  Leave penrose and remove kerlix. Change outer dressing as needed Continue abx Repeat pelvic CT  LOS: 2 days    TOTH III,[name] Port S 01/26/2017

## 2017-01-26 NOTE — Plan of Care (Signed)
  Pain Managment: General experience of comfort will improve 01/26/2017 0015 - Progressing by Antionette CharPeng, Kashana Breach P, RN

## 2017-01-26 NOTE — Progress Notes (Signed)
2 Days Post-Op    CC: Ischio rectal abscess  Subjective: Dressing has not been changed yet.  He has a JP in place.  He has a second ulcer that connects with the open area see picture below.  He also has no help at home.  He will needed skilled nursing facility care post hospitalization.  Objective: Vital signs in last 24 hours: Temp:  [98.5 F (36.9 C)-101.2 F (38.4 C)] 101.2 F (38.4 C) (01/07 0456) Pulse Rate:  [90-108] 108 (01/07 0456) Resp:  [18] 18 (01/07 0456) BP: (90-109)/(38-54) 107/54 (01/07 0456) SpO2:  [97 %-100 %] 100 % (01/07 0456) Last BM Date: 01/25/17 1320 p.o. 1400 IV 2250 urine Stool x2 Patient had another low-grade fever around 3 AM this morning.  T-max 100.5.  Vital signs are stable otherwise no fever. No labs this a.m. CT of the pelvis with contrast yesterday Intake/Output from previous day: Shows a decrease in the size of the abscess cavity at the medial aspect of the right buttocks.  Status post debridement with placement of drain.  There was less subcutaneous emphysema.  Reactive retroperitoneal lymphadenopathy.  Neuropathic arthropathy of both hips.  He also has 2 small pleural effusions. 01/06 0701 - 01/07 0700 In: 3646.7 [P.O.:840; I.V.:2296.7; IV Piggyback:510] Out: 1952 [Urine:1950; Stool:2] Intake/Output this shift: Total I/O In: 720 [P.O.:720] Out: -   General appearance: alert, cooperative and no distress Resp: clear to auscultation bilaterally Skin: see picture below    This necrotic area is about 5 cm across, and connects to the open area in the picture.  The distal site tracts down to his rectum about 9 cm.  I think this necrotic area needs to come off, I think it is a full thickness skin loss.    Lab Results:  Recent Labs    01/25/17 0610 01/26/17 0624  WBC 18.7* 12.3*  HGB 9.6* 8.7*  HCT 28.2* 25.6*  PLT 339 408*    BMET Recent Labs    01/25/17 0610 01/26/17 0624  NA 139 140  K 2.7* 3.8  CL 107 112*  CO2 25 23    GLUCOSE 158* 135*  BUN 7 6  CREATININE 0.63 0.62  CALCIUM 7.5* 7.7*   PT/INR No results for input(s): LABPROT, INR in the last 72 hours.  Recent Labs  Lab 01/24/17 1629  AST 21  ALT 21  ALKPHOS 69  BILITOT 0.6  PROT 7.0  ALBUMIN 2.5*     Lipase  No results found for: LIPASE   Prior to Admission medications   Medication Sig Start Date End Date Taking? Authorizing Provider  Catheters (FOLEY CATHETER 2-WAY) MISC Use as directed by urologist. 12/30/12  Yes Leona Singleton, MD  cephALEXin (KEFLEX) 500 MG capsule Take 500 mg by mouth 4 (four) times daily.   Yes [provider]  lisinopril-hydrochlorothiazide (PRINZIDE,ZESTORETIC) 20-25 MG tablet TAKE 1 TABLET BY MOUTH DAILY. TAKE 1/2 TABLET AT BEDTIME. 11/10/16  Yes Mikell, Antionette Poles, MD  Multiple Vitamin (MULTIVITAMIN WITH MINERALS) TABS tablet Take 1 tablet by mouth daily.   Yes [provider]   Medications: . enoxaparin (LOVENOX) injection  40 mg Subcutaneous Q24H  . potassium chloride  40 mEq Oral TID  . senna  1 tablet Oral BID   . ampicillin-sulbactam (UNASYN) IV Stopped (01/26/17 0538)  . DAPTOmycin (CUBICIN)  IV Stopped (01/25/17 2039)  . dextrose 5 % and 0.9 % NaCl with KCl 40 mEq/L 100 mL/hr at 01/26/17 0407   Anti-infectives (From admission, onward)  Start     Dose/Rate Route Frequency Ordered Stop   01/25/17 2000  DAPTOmycin (CUBICIN) 460 mg in sodium chloride 0.9 % IVPB  Status:  Discontinued     460 mg 218.4 mL/hr over 30 Minutes Intravenous Every 24 hours 01/24/17 2202 01/25/17 1007   01/25/17 2000  DAPTOmycin (CUBICIN) 500 mg in sodium chloride 0.9 % IVPB     500 mg 220 mL/hr over 30 Minutes Intravenous Every 24 hours 01/25/17 1007     01/25/17 0600  Ampicillin-Sulbactam (UNASYN) 3 g in sodium chloride 0.9 % 100 mL IVPB     3 g 200 mL/hr over 30 Minutes Intravenous Every 6 hours 01/25/17 0437     01/24/17 2030  DAPTOmycin (CUBICIN) 460 mg in sodium chloride 0.9 % IVPB     460  mg 218.4 mL/hr over 30 Minutes Intravenous STAT 01/24/17 2016 01/24/17 2130   01/24/17 2015  Ampicillin-Sulbactam (UNASYN) 3 g in sodium chloride 0.9 % 100 mL IVPB     3 g 200 mL/hr over 30 Minutes Intravenous STAT 01/24/17 2014 01/24/17 2110      Assessment/Plan Ischio rectal abscess S/P incision and drainage of complex ischio rectal abscess 01/24/17, Dr. Almond LintFaera Byerly. Sepsis/Sirs Decubitus - ? Stage IV  Hypokalemia/hyponatremia Paraplegia wheelchair-bound - spina bifida. Chronic indwelling Foley catheter Wheelchair-bound Chronic ulcer on the perineum Hypertension   FEN: IV fluids/regular diet ID: Unasyn 1/06 =>> day 3  Daptomycin=>>   Day 3   (no growth on cultures so far, gram stain multiple organisms) DVT: Lovenox Foley: Chronic Follow-up: Dr. Almond LintFaera Byerly  Plan:  Will review with Dr. Carolynne Edouardoth.  Get an air mattress, ask wound care to see and get case Manager to see.      LOS: 2 days    Jeriann Sayres 01/26/2017 [phone]

## 2017-01-26 NOTE — Care Management Note (Signed)
Case Management Note  Patient Details  Name: Anthony [name] MRN: [id number] Date of Birth: 1970/10/01  Subjective/Objective:                  Ischiorectal abcess and drainage.  Action/Plan: Date:  January 26, 2017 Chart reviewed for concurrent status and case management needs.  Will continue to follow patient progress.  Discharge Planning: following for needs  Expected discharge date: January 29 2017 Marcelle SmilingRhonda Davis, BSN, [place], [place]   [phone]   Expected Discharge Date:  01/28/17               Expected Discharge Plan:  Home/Self Care  In-House Referral:     Discharge planning Services  CM Consult  Post Acute Care Choice:    Choice offered to:     DME Arranged:    DME Agency:     HH Arranged:    HH Agency:     Status of Service:  In process, will continue to follow  If discussed at Long Length of Stay Meetings, dates discussed:    Additional Comments:  Golda AcreDavis, Rhonda Lynn, RN 01/26/2017, 10:26 AM

## 2017-01-26 NOTE — Progress Notes (Signed)
Initial Nutrition Assessment  DOCUMENTATION CODES:   Not applicable  INTERVENTION:    30 ml Prostat BID, each supplement provides 100 kcals and 15 grams protein.   Provide MVI daily  NUTRITION DIAGNOSIS:   Increased nutrient needs related to wound healing as evidenced by estimated needs.  GOAL:   Patient will meet greater than or equal to 90% of their needs  MONITOR:   PO intake, Supplement acceptance, Weight trends, Labs, Skin  REASON FOR ASSESSMENT:   Malnutrition Screening Tool    ASSESSMENT:   Pt with PMH significant for Spina bifida chronically wheel chair bound. Presents this admission with 1-2 week history of drainage from perirectal region. Pt underwent incision and drainage of ischiorectal abscess 1/5.    Spoke with pt at bedside. Pt had poor appetite one week prior to admission due to feelings of weakness. States he did not consume anything for four days within this time span. Pt consumes MVI daily at home. Appetite is back to baseline this admission. Pt consumed 100% of breakfast that included an omelet, potatoes, and grits. Pt requesting more food. Discussed the importance of protein intake for wound healing s/p I&D. Pt amendable to Prostat. Weight records are limited. Pt denies any recent wt loss. Nutrition-Focused physical exam completed. Bilateral lower extremities showing moderate to severe wasting likely related to pt being wheel chair bound.   Medications reviewed and include: IV abx, D5 with 40 mEq KCl @ 100 ml/hr Labs reviewed: calcium ionized 1.00  NUTRITION - FOCUSED PHYSICAL EXAM:    Most Recent Value  Orbital Region  No depletion  Upper Arm Region  No depletion  Thoracic and Lumbar Region  Unable to assess  Buccal Region  No depletion  Temple Region  No depletion  Clavicle Bone Region  No depletion  Clavicle and Acromion Bone Region  No depletion  Scapular Bone Region  Unable to assess  Dorsal Hand  No depletion  Patellar Region  Moderate  depletion  Anterior Thigh Region  Moderate depletion  Posterior Calf Region  Moderate depletion  Edema (RD Assessment)  None  Hair  Reviewed  Eyes  Reviewed  Mouth  Reviewed  Skin  Reviewed  Nails  Reviewed      Diet Order:  Diet regular Room service appropriate? Yes; Fluid consistency: Thin  EDUCATION NEEDS:   Education needs have been addressed  Skin:  Skin Assessment: Skin Integrity Issues: Skin Integrity Issues:: Other (Comment), Incisions Incisions: buttocks Other: deep tissue injury buttocks  Last BM:  01/26/16  Height:   Ht Readings from Last 1 Encounters:  01/25/17 5\' 8"  (1.727 m)    Weight:   Wt Readings from Last 1 Encounters:  01/25/17 180 lb 12.4 oz (82 kg)    Ideal Body Weight:  70 kg  BMI:  Body mass index is 27.49 kg/m.  Estimated Nutritional Needs:   Kcal:  1750-1950 kcal/day  Protein:  85-95 g/day  Fluid:  >1.7 L/day    Vanessa Kickarly Tracye Szuch RD, LDN Clinical Nutrition Pager # - [phone]

## 2017-01-26 NOTE — Transfer of Care (Signed)
Immediate Anesthesia Transfer of Care Note  Patient: Anthony [name]  Procedure(s) Performed: IRRIGATION AND DEBRIDEMENT WOUND- BUTTOCK ABSCESS (N/A )  Patient Location: PACU  Anesthesia Type:General  Level of Consciousness: awake  Airway & Oxygen Therapy: Patient Spontanous Breathing and Patient connected to face mask oxygen  Post-op Assessment: Report given to RN and Post -op Vital signs reviewed and stable  Post vital signs: Reviewed and stable  Last Vitals:  Vitals:   01/25/17 2238 01/26/17 0456  BP: (!) 90/38 (!) 107/54  Pulse: (!) 106 (!) 108  Resp: 18 18  Temp: 37.8 C (!) 38.4 C  SpO2: 97% 100%    Last Pain:  Vitals:   01/26/17 0608  TempSrc:   PainSc: Asleep         Complications: No apparent anesthesia complications

## 2017-01-27 DIAGNOSIS — Q059 Spina bifida, unspecified: Secondary | ICD-10-CM

## 2017-01-27 DIAGNOSIS — L8990 Pressure ulcer of unspecified site, unspecified stage: Secondary | ICD-10-CM

## 2017-01-27 MED ORDER — CALCIUM CARBONATE ANTACID 500 MG PO CHEW
1.0000 | CHEWABLE_TABLET | Freq: Three times a day (TID) | ORAL | Status: DC | PRN
  Administered 2017-01-27 – 2017-01-28 (×4): 200 mg via ORAL
  Filled 2017-01-27 (×4): qty 1

## 2017-01-27 NOTE — Care Management Important Message (Signed)
Important Message  Patient Details  Name: Anthony [name] MRN: [id number] Date of Birth: 04-Feb-1970   Medicare Important Message Given:  Yes    Caren MacadamFuller, Rovena Hearld 01/27/2017, 10:23 AMImportant Message  Patient Details  Name: Anthony [name] MRN: [id number] Date of Birth: 04-Feb-1970   Medicare Important Message Given:  Yes    Caren MacadamFuller, Alverto Shedd 01/27/2017, 10:23 AM

## 2017-01-28 LAB — CBC
HCT: 30.6 % — ABNORMAL LOW (ref 39.0–52.0)
Hemoglobin: 9.8 g/dL — ABNORMAL LOW (ref 13.0–17.0)
MCH: 27.8 pg (ref 26.0–34.0)
MCHC: 32 g/dL (ref 30.0–36.0)
MCV: 86.7 fL (ref 78.0–100.0)
PLATELETS: 518 10*3/uL — AB (ref 150–400)
RBC: 3.53 MIL/uL — ABNORMAL LOW (ref 4.22–5.81)
RDW: 14.6 % (ref 11.5–15.5)
WBC: 11.4 10*3/uL — AB (ref 4.0–10.5)

## 2017-01-28 LAB — AEROBIC/ANAEROBIC CULTURE W GRAM STAIN (SURGICAL/DEEP WOUND)

## 2017-01-28 LAB — BASIC METABOLIC PANEL
Anion gap: 4 — ABNORMAL LOW (ref 5–15)
BUN: 6 mg/dL (ref 6–20)
CALCIUM: 8.5 mg/dL — AB (ref 8.9–10.3)
CO2: 26 mmol/L (ref 22–32)
CREATININE: 0.68 mg/dL (ref 0.61–1.24)
Chloride: 111 mmol/L (ref 101–111)
Glucose, Bld: 112 mg/dL — ABNORMAL HIGH (ref 65–99)
Potassium: 4.7 mmol/L (ref 3.5–5.1)
SODIUM: 141 mmol/L (ref 135–145)

## 2017-01-28 LAB — AEROBIC/ANAEROBIC CULTURE (SURGICAL/DEEP WOUND)

## 2017-01-28 MED ORDER — COLLAGENASE 250 UNIT/GM EX OINT
TOPICAL_OINTMENT | Freq: Two times a day (BID) | CUTANEOUS | Status: DC
  Administered 2017-01-28: 12:00:00 via TOPICAL
  Administered 2017-01-28: 1 via TOPICAL
  Administered 2017-01-29 – 2017-02-03 (×9): via TOPICAL
  Filled 2017-01-28: qty 90

## 2017-01-28 NOTE — Progress Notes (Cosign Needed)
4 Days Post-Op    CC: Ischiorectal abscess  Subjective: No real change.  Dr. Carolynne Edouardoth debride the upper site yesterday at the bedside.  Starting hydrotherapy today.  I showed PT where the wound tract so they can pack all the sites well. He cannot tell a great deal of difference and comfort with the air mattress.    Objective: Vital signs in last 24 hours: Temp:  [98.1 F (36.7 C)-100.3 F (37.9 C)] 100.3 F (37.9 C) (01/09 0505) Pulse Rate:  [82-95] 82 (01/09 0505) Resp:  [18-20] 18 (01/09 0505) BP: (119-134)/(71-76) 134/74 (01/09 0505) SpO2:  [99 %-100 %] 100 % (01/09 0505) Last BM Date: 01/27/17 720 PO 2600 IV 4100 urine Stool x 2 Tm 100.3 3 AM Labs OK WBC improving K+ 4.7  Intake/Output from previous day: 01/08 0701 - 01/09 0700 In: 3330 [P.O.:720; I.V.:2400; IV Piggyback:210] Out: 4100 [Urine:4100] Intake/Output this shift: Total I/O In: 240 [P.O.:240] Out: 1000 [Urine:1000]  General appearance: alert, cooperative and no distress Resp: clear to auscultation bilaterally Skin: He still has dark necrotic tissue in the upper decubitus that was debrided yesterday.  The open site that Dr. Donell BeersByerly open and the site that he did and the site he debrided are contiguous.  Going caudal leave the open psych is down 9-11 cm.  I showed the PT text this site and asked them to be sure the packing got into this area also.   Lab Results:  Recent Labs    01/26/17 0624 01/28/17 0608  WBC 12.3* 11.4*  HGB 8.7* 9.8*  HCT 25.6* 30.6*  PLT 408* 518*    BMET Recent Labs    01/26/17 0624 01/28/17 0608  NA 140 141  K 3.8 4.7  CL 112* 111  CO2 23 26  GLUCOSE 135* 112*  BUN 6 6  CREATININE 0.62 0.68  CALCIUM 7.7* 8.5*   PT/INR No results for input(s): LABPROT, INR in the last 72 hours.  Recent Labs  Lab 01/24/17 1629  AST 21  ALT 21  ALKPHOS 69  BILITOT 0.6  PROT 7.0  ALBUMIN 2.5*     Lipase  No results found for: LIPASE   Medications: . collagenase   Topical BID   . enoxaparin (LOVENOX) injection  40 mg Subcutaneous Q24H  . feeding supplement (PRO-STAT SUGAR FREE 64)  30 mL Oral BID  . multivitamin with minerals  1 tablet Oral Daily  . senna  1 tablet Oral BID    Assessment/Plan Ischio rectal abscess S/P incision and drainage of complex ischio rectal abscess 01/24/17, Dr. Almond LintFaera Byerly. Sepsis/Sirs Decubitus -  Stage IV  Hypokalemia/hyponatremia Paraplegia wheelchair-bound - spina bifida. Chronic indwelling Foley catheter Wheelchair-bound Chronic ulcer on the perineum Hypertension   FEN: IV fluids/regular diet ID: Unasyn 1/06 =>> day 3  Daptomycin=>>   Day 3   (no growth on cultures so far, gram stain multiple organisms) DVT: Lovenox Foley: Chronic Follow-up: Dr. Almond LintFaera Byerly   Plan: Local wound care, hydrotherapy, antibiotics.  Case manager is seen and working on skilled nursing facility, and post hospital care.  Patient lives independently and has no family support they can help him with wound care.    LOS: 4 days    Keyonda Bickle 01/28/2017 [phone]

## 2017-01-28 NOTE — Progress Notes (Signed)
Physical Therapy Hydrotherapy Evaluation  Time: 1150-1224  34 minutes  Pt does not report any pain during procedure.  Pulsatile lavage and selective debridement to proximal R buttock wound.  Also performed packing to communicating wound and tunnel from distal R buttock wound best described per WOC note, "Distal right buttock wound:  4cm x 2.5cm x 3cm with communication to proximal wound at 12 o'clock and deep tunnel toward rectum measuring 11cm at 7 o'clock. There are two ends of penrose drains protruding at 6 and 12 o'clock that are sutured in place.  Wound bed is red, moist and free of necrotic tissue."     01/28/17 1300  Subjective Assessment  Subjective Pt pleasant and agreeable to hydrotherapy.  Prior Treatments S/P incision and drainage of complex ischio rectal abscess 01/24/17  Evaluation and Treatment  Evaluation and Treatment Procedures Explained to Patient/Family Yes  Evaluation and Treatment Procedures agreed to  Pressure Injury 01/28/17 Unstageable - Full thickness tissue loss in which the base of the ulcer is covered by slough (yellow, tan, gray, green or [place]urches) and/or eschar (tan, Heinsohn or black) in the wound bed. HYDRO  Date First Assessed/Time First Assessed: 01/28/17 1150   Location: Buttocks  Location Orientation: Right;Proximal  Staging: Unstageable - Full thickness tissue loss in which the base of the ulcer is covered by slough (yellow, tan, gray, green or [place]...  Dressing Type ABD;Gauze (Comment);Moist to dry;Paper tape (kerlix)  Dressing Changed  Dressing Change Frequency PRN  State of Healing Eschar  Site / Wound Assessment Black;Mauch  % Wound base Black/Eschar 100%  Peri-wound Assessment Intact  Wound Length (cm) 5.5 cm  Wound Width (cm) 3 cm  Wound Depth (cm) 3.5 cm  Wound Surface Area (cm^2) 16.5 cm^2  Wound Volume (cm^3) 57.75 cm^3  Tunneling (cm) (communicating tunnel to distal wound (see tunneling section))  Margins Unattached edges (unapproximated)   Drainage Amount Moderate  Drainage Description Serosanguineous;Odor  Treatment Debridement (Selective);Hydrotherapy (Pulse lavage);Packing (Saline gauze)  Hydrotherapy  Pulsed lavage therapy - wound location proximal R buttocks  Pulsed Lavage with Suction (psi) 8 psi  Pulsed Lavage with Suction - Normal Saline Used 1000 mL  Pulsed Lavage Tip Tip with splash shield  Selective Debridement  Selective Debridement - Location proximal R buttocks  Selective Debridement - Tools Used Scissors;Forceps  Selective Debridement - Tissue Removed necrotic tissue  Wound Therapy - Assess/Plan/Recommendations  Wound Therapy - Clinical Statement Pt would benefit from hydrotherapy to facilitate wound healing by removing necrotic tissue and decreasing bioburden.  Wound Therapy - Functional Problem List hx spina bifida, w/c bound, no sensation or motor function of LEs  Factors Delaying/Impairing Wound Healing Immobility;Incontinence  Hydrotherapy Plan Dressing change;Debridement;Patient/family education;Pulsatile lavage with suction  Wound Therapy - Frequency 6X / week  Wound Therapy - Current Recommendations (WOC and surgery involved)  Wound Therapy - Follow Up Recommendations Skilled nursing facility  Wound Plan assist with wound healing of proximal wound as outlined above  Wound Therapy Goals - Improve the function of patient's integumentary system by progressing the wound(s) through the phases of wound healing by:  Decrease Necrotic Tissue to 20  Decrease Necrotic Tissue - Progress Goal set today  Increase Granulation Tissue to 80  Increase Granulation Tissue - Progress Goal set today  Improve Drainage Characteristics Min  Improve Drainage Characteristics - Progress Goal set today  Time For Goal Achievement 2 weeks  Wound Therapy - Potential for Goals Fair   Zenovia JarredKati Kysen Wetherington, PT, DPT 01/28/2017 Pager: [phone]

## 2017-01-28 NOTE — Consult Note (Signed)
WOC Nurse wound consult note Reason for Consult:Pressure Injury Wound type:Pressure, infectious Pressure Injury POA: Yes Dr. Carolynne Edouardoth saw yesterday and recommended hydrotherapy (see his note dated 01/27/17).  I have placed that order so that the service can begin today. WOC Nursing will support both PT and CCS and see today.  Thanks, Ladona MowLaurie Eulah Walkup, MSN, RN, GNP, Hans EdenCWOCN, CWON-AP, FAAN  Pager# [phone]

## 2017-01-28 NOTE — Progress Notes (Signed)
Pharmacy Antibiotic Note  Clois ComberDonald A Grade is a 47 y.o. male admitted on 01/24/2017 with cellulitis.  Pharmacy has been consulted for Unasyn dosing. abscess and cellulitis 1/5 washout in OR, drain placed 1/8 bedside debride 1/9 start hydrotherapy WBC coming down, 11.4, Tmax 100.5, Cr WNL 0.68.  Plan: Unasyn 3gm iv q6hr daptomycin 500 mg IV q24 - weekly CK on Sundays Pharmacy to sign off  Height: 5\' 8"  (172.7 cm) Weight: 180 lb 12.4 oz (82 kg) IBW/kg (Calculated) : 68.4  Temp (24hrs), Avg:99.2 F (37.3 C), Min:98.1 F (36.7 C), Max:100.3 F (37.9 C)  Recent Labs  Lab 01/24/17 1629 01/24/17 1631 01/24/17 1632 01/24/17 1634 01/24/17 2219 01/25/17 0610 01/26/17 0624 01/28/17 0608  WBC 21.9*  --   --   --   --  18.7* 12.3* 11.4*  CREATININE 0.89  --   --  0.80 0.69 0.63 0.62 0.68  LATICACIDVEN  --  1.23 1.24  --   --   --   --   --     Estimated Creatinine Clearance: 110.4 mL/min (by C-G formula based on SCr of 0.68 mg/dL).    Allergies  Allergen Reactions  . Vancomycin Itching   Antimicrobials this admission:  Unasyn 01/24/2017 >> Daptomycin 01/24/2017 >> Dose adjustments this admission:   Microbiology results:  1/5 BCx: ngtd 1/5 UCx: insignificant growth  1/5 abscess Cx: abundant GNR, GPCs  Thank you for allowing pharmacy to be a part of this patient's care.  Herby AbrahamMichelle T. Jennyfer Nickolson, Pharm.D. [phone] 01/28/2017 1:59 PM

## 2017-01-28 NOTE — Consult Note (Signed)
WOC Nurse wound consult note Reason for Consult:right buttock wounds, proximal and distal. Stage 4 pressure injuries with tracking to rectum.  Proximal and distal wound communicate. Proximal wound is 100% necrotic. Wound was debrided yesterday pm by Dr. Carolynne Edouardoth. Wound type:Pressure, infectious Pressure Injury POA: Yes Measurement: Proximal right buttock wound:  5.5cm x 2.5cm with true depth unable to be determined due to black, stringy necrotic/nonviable wound bed.  This wound communicates to the distal wound at 7 o'clock. Strong odor consistent with necrotic tissue is present-even after cleansing. Small amount of dried serosanguinous drainage from debridement yesterday is on old dressing.  Scant drainage ([place]/grey) consistent with necrotic tissue today. Distal right buttock wound:  4cm x 2.5cm x 3cm with communication to proximal wound at 12 o'clock and deep tunnel toward rectum measuring 11cm at 7 o'clock. There are two ends of penrose drains protruding at 6 and 12 o'clock that are sutured in place.  Wound bed is red, moist and free of necrotic tissue. Scant serous drainage. Wound bed:As described above Drainage (amount, consistency, odor) As described above Periwound:Intact. Dressing procedure/placement/frequency: Patient is on a therapeutic mattress with low air loss feature. Recommend consideration for wide surgical debridement knowing this may require a diverting colostomy for healing. At this time, will support Hydrotherapy and CCS in topical wound care. Have ordered twice daily collagenase (Santyl) despite most common practice being once-daily as patient is incontinent and dressing may have to be changed prior to patient getting maximum benefit from enzymatic debriding agent.  WOC nursing team will not follow closely, seeing with hydrotherapy at least weekly, but will remain available to this patient, the nursing, surgical and medical teams.  Please re-consult if needed in between  visits. Thanks, Ladona MowLaurie Maddie Brazier, MSN, RN, GNP, Hans EdenCWOCN, CWON-AP, FAAN  Pager# [phone]

## 2017-01-29 ENCOUNTER — Other Ambulatory Visit: Payer: Self-pay

## 2017-01-29 ENCOUNTER — Encounter (HOSPITAL_COMMUNITY): Payer: Self-pay

## 2017-01-29 DIAGNOSIS — G8220 Paraplegia, unspecified: Secondary | ICD-10-CM

## 2017-01-29 LAB — CULTURE, BLOOD (ROUTINE X 2)
CULTURE: NO GROWTH
Culture: NO GROWTH
Special Requests: ADEQUATE
Special Requests: ADEQUATE

## 2017-01-29 NOTE — Progress Notes (Signed)
Date: January 29, 2017 Chart review for discharge needs:  None found for case management. Patient has no questions concerning post hospital care. 

## 2017-01-29 NOTE — Progress Notes (Signed)
5 Days Post-Op    CC:   Ischiorectal abscess    Subjective: We have started hydrotherapy.  Still has a lot of dark black tissue at the upper open site.  He also has purulent fluid at the base still present in the lower incision.  The lower wound was not packed at the base.  Will need continued local therapy.  Continued antibiotic therapy.  Objective: Vital signs in last 24 hours: Temp:  [98.5 F (36.9 C)-99.2 F (37.3 C)] 98.7 F (37.1 C) (01/10 0528) Pulse Rate:  [74-88] 74 (01/10 0528) Resp:  [19-20] 20 (01/10 0528) BP: (119-126)/(61-76) 119/61 (01/10 0528) SpO2:  [99 %-100 %] 100 % (01/10 0528) Last BM Date: 01/29/17 MULTIPLE ORGANISMS PRESENT, NONE PREDOMINANT  BACTEROIDES FRAGILIS  BETA LACTAMASE POSITIVE  No growth from the blood cultures and insignificant growth from the urine culture. 1020 p.o. 310 IV 5400 urine BM x2 T-max 100.3 yesterday WBC slowly improving.  BMP is stable. Intake/Output from previous day: 01/09 0701 - 01/10 0700 In: 1330 [P.O.:1020; IV Piggyback:310] Out: 5400 [Urine:5400] Intake/Output this shift: Total I/O In: 360 [P.O.:360] Out: -   General appearance: alert, cooperative and no distress Resp: clear to auscultation bilaterally Skin: The primary open incision is clean and stable.  He still has some purulent fluid at the distal base.  Upper wound is still has some black material that is slowly cleaning up with hydrotherapy.  Both JPs are easily mobilized during the exam.  I put him both back where they were originally. He also has an area on his right thigh is red about the breakdown.  This over an old scar that has metal below it.  There is no subcutaneous tissue or muscle over it to protect it.  We will cover this with a soft dressing to help cushion this area. Lab Results:  Recent Labs    01/28/17 0608  WBC 11.4*  HGB 9.8*  HCT 30.6*  PLT 518*    BMET Recent Labs    01/28/17 0608  NA 141  K 4.7  CL 111  CO2 26  GLUCOSE 112*   BUN 6  CREATININE 0.68  CALCIUM 8.5*   PT/INR No results for input(s): LABPROT, INR in the last 72 hours.  Recent Labs  Lab 01/24/17 1629  AST 21  ALT 21  ALKPHOS 69  BILITOT 0.6  PROT 7.0  ALBUMIN 2.5*     Lipase  No results found for: LIPASE   Medications: . collagenase   Topical BID  . enoxaparin (LOVENOX) injection  40 mg Subcutaneous Q24H  . feeding supplement (PRO-STAT SUGAR FREE 64)  30 mL Oral BID  . multivitamin with minerals  1 tablet Oral Daily  . senna  1 tablet Oral BID   Anti-infectives (From admission, onward)   Start     Dose/Rate Route Frequency Ordered Stop   01/25/17 2000  DAPTOmycin (CUBICIN) 460 mg in sodium chloride 0.9 % IVPB  Status:  Discontinued     460 mg 218.4 mL/hr over 30 Minutes Intravenous Every 24 hours 01/24/17 2202 01/25/17 1007   01/25/17 2000  DAPTOmycin (CUBICIN) 500 mg in sodium chloride 0.9 % IVPB     500 mg 220 mL/hr over 30 Minutes Intravenous Every 24 hours 01/25/17 1007     01/25/17 0600  Ampicillin-Sulbactam (UNASYN) 3 g in sodium chloride 0.9 % 100 mL IVPB     3 g 200 mL/hr over 30 Minutes Intravenous Every 6 hours 01/25/17 0437  01/24/17 2030  DAPTOmycin (CUBICIN) 460 mg in sodium chloride 0.9 % IVPB     460 mg 218.4 mL/hr over 30 Minutes Intravenous STAT 01/24/17 2016 01/24/17 2130   01/24/17 2015  Ampicillin-Sulbactam (UNASYN) 3 g in sodium chloride 0.9 % 100 mL IVPB     3 g 200 mL/hr over 30 Minutes Intravenous STAT 01/24/17 2014 01/24/17 2110      Assessment/Plan Ischio rectal abscess S/P incision and drainage of complex ischio rectal abscess 01/24/17, Dr. Almond Lint. Sepsis/Sirs Decubitus -  Stage IV  Hypokalemia/hyponatremia Paraplegia wheelchair-bound-spina bifida. Chronic indwelling Foley catheter Wheelchair-bound Chronic ulcer on the perineum Hypertension   FEN: IV fluids/regular diet ID: Unasyn1/06 =>> day 4Daptomycin=>>Day 4 MULTIPLE ORGANISMS PRESENT, NONE PREDOMINANT    BACTEROIDES FRAGILIS   BETA LACTAMASE POSITIVE   No growth from the blood cultures and insignificant growth from the urine culture.  DVT: Lovenox Foley: Chronic Follow-up: Dr. Almond Lint  Plan: He is making some progress.  I do not see how he can go home,  live independently, and dress these wounds by himself.  I think he will need some skilled nursing care postop.  I have asked case manager to see and help begin this process.  I do not think he is ready for discharge yet he still needs some ongoing mechanical debridement with hydrotherapy.  If we can get a facility that does hydrotherapy that would be nice.  If not we will continue to local wound care here and try to get him in a position where he can do well with just wet-to-dry dressings.     LOS: 5 days    Anthony [name] 01/29/2017 [phone]

## 2017-01-29 NOTE — Progress Notes (Signed)
Physical Therapy Hydrotherapy Treatment Note    01/29/17 1500  Subjective Assessment  Subjective Pt educated to turn and reposition frequently.  Prior Treatments S/P incision and drainage of complex ischio rectal abscess 01/24/17  Evaluation and Treatment  Evaluation and Treatment Procedures Explained to Patient/Family Yes  Evaluation and Treatment Procedures agreed to  Pressure Injury 01/28/17 Unstageable - Full thickness tissue loss in which the base of the ulcer is covered by slough (yellow, tan, gray, green or Tenny) and/or eschar (tan, Spagnuolo or black) in the wound bed. HYDRO  Date First Assessed/Time First Assessed: 01/28/17 1150   Location: Buttocks  Location Orientation: Right;Proximal  Staging: Unstageable - Full thickness tissue loss in which the base of the ulcer is covered by slough (yellow, tan, gray, green or [place]...  Dressing Type ABD;Gauze (Comment);Moist to dry;Paper tape (kerlix, santyl)  Dressing Changed  Dressing Change Frequency PRN  State of Healing Eschar  Site / Wound Assessment Black;[place]iest  % Wound base Black/Eschar 100%  Peri-wound Assessment Intact;Erythema (non-blanchable)  Tunneling (cm) (see evaluation note, packing to tunnels performed)  Margins Unattached edges (unapproximated)  Drainage Amount Minimal  Drainage Description Serosanguineous;Odor  Treatment Debridement (Selective);Hydrotherapy (Pulse lavage);Packing (Saline gauze)  Hydrotherapy  Pulsed lavage therapy - wound location proximal R buttocks  Pulsed Lavage with Suction (psi) 8 psi  Pulsed Lavage with Suction - Normal Saline Used 1000 mL  Pulsed Lavage Tip Tip with splash shield  Selective Debridement  Selective Debridement - Location proximal R buttocks  Selective Debridement - Tools Used Scissors;Forceps  Selective Debridement - Tissue Removed necrotic tissue  Wound Therapy - Assess/Plan/Recommendations  Wound Therapy - Clinical Statement Pt would benefit from hydrotherapy to facilitate  wound healing by removing necrotic tissue and decreasing bioburden.  Wound Therapy - Functional Problem List hx spina bifida, w/c bound, no sensation or motor function of LEs  Factors Delaying/Impairing Wound Healing Immobility;Incontinence  Hydrotherapy Plan Dressing change;Debridement;Patient/family education;Pulsatile lavage with suction  Wound Therapy - Frequency 6X / week  Wound Therapy - Current Recommendations (WOC and surgery involved)  Wound Therapy - Follow Up Recommendations Skilled nursing facility  Wound Plan assist with wound healing of proximal wound as outlined above  Wound Therapy Goals - Improve the function of patient's integumentary system by progressing the wound(s) through the phases of wound healing by:  Decrease Necrotic Tissue to 20  Decrease Necrotic Tissue - Progress Progressing toward goal  Increase Granulation Tissue to 80  Increase Granulation Tissue - Progress Progressing toward goal  Improve Drainage Characteristics Min  Improve Drainage Characteristics - Progress Progressing toward goal  Time For Goal Achievement 2 weeks  Wound Therapy - Potential for Goals Fair  Time: [id number]  45 minutes  Zenovia JarredKati Destenie Ingber, PT, DPT 01/29/2017 Pager: [phone]

## 2017-01-30 LAB — BASIC METABOLIC PANEL
Anion gap: 6 (ref 5–15)
BUN: 10 mg/dL (ref 6–20)
CALCIUM: 8.1 mg/dL — AB (ref 8.9–10.3)
CO2: 26 mmol/L (ref 22–32)
CREATININE: 0.67 mg/dL (ref 0.61–1.24)
Chloride: 110 mmol/L (ref 101–111)
GFR calc non Af Amer: >60 mL/min (ref >60)
Glucose, Bld: 91 mg/dL (ref 65–99)
Potassium: 3.9 mmol/L (ref 3.5–5.1)
SODIUM: 142 mmol/L (ref 135–145)

## 2017-01-30 LAB — CBC
HCT: 31.3 % — ABNORMAL LOW (ref 39.0–52.0)
Hemoglobin: 10.1 g/dL — ABNORMAL LOW (ref 13.0–17.0)
MCH: 27.9 pg (ref 26.0–34.0)
MCHC: 32.3 g/dL (ref 30.0–36.0)
MCV: 86.5 fL (ref 78.0–100.0)
PLATELETS: 531 10*3/uL — AB (ref 150–400)
RBC: 3.62 MIL/uL — AB (ref 4.22–5.81)
RDW: 14.2 % (ref 11.5–15.5)
WBC: 10.9 10*3/uL — AB (ref 4.0–10.5)

## 2017-01-30 MED ORDER — PREMIER PROTEIN SHAKE
11.0000 [oz_av] | ORAL | Status: DC
Start: 2017-01-30 — End: 2017-02-03
  Administered 2017-01-30 – 2017-02-03 (×5): 11 [oz_av] via ORAL
  Filled 2017-01-30 (×5): qty 325.31

## 2017-01-30 NOTE — [place] FL2 (Signed)
[place] MEDICAID FL2 LEVEL OF CARE SCREENING TOOL     IDENTIFICATION  Patient Name: Anthony [name] Birthdate: 08-10-1970 Sex: male Admission Date (Current Location): 01/24/2017  [facility] and [place] Number:  Producer, television/film/video and Address:  [facility],  [address], [place] [address]      Provider Number: [id number]  Attending Physician Name and Address:  Montez Morita Md, MD  Relative Name and Phone Number:       Current Level of Care: Hospital Recommended Level of Care: Skilled Nursing Facility Prior Approval Number:    Date Approved/Denied: 01/30/17 PASRR Number: [id number]  Discharge Plan: SNF    Current Diagnoses: Patient Active Problem List   Diagnosis Date Noted  . Paraplegia (HCC) 01/29/2017  . Pressure injury of skin 01/27/2017  . 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/24/2017 01/24/2017  . Status post debridement 01/24/2017  . Hyperlipidemia 03/11/2014  . Spina bifida aperta of lumbar spine (HCC) 09/30/2012  . Routine adult health maintenance 03/26/2012  . Shoulder pain, bilateral 01/27/2011  . ASTHMA, PERSISTENT 06/30/2007  . Essential hypertension 06/01/2007  . ALLERGIC RHINITIS 06/01/2007  . DYSPNEA 06/01/2007  . ANXIETY 03/19/2006  . 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 VENOUS INSUFFICIENCY, CHRONIC 03/19/2006  . Pressure ulcer 03/19/2006    Orientation RESPIRATION BLADDER Height & Weight     Self, Time, Situation, Place  Normal Indwelling catheter Weight: 180 lb 12.4 oz (82 kg) Height:  5\' 8"  (172.7 cm)  BEHAVIORAL SYMPTOMS/MOOD NEUROLOGICAL BOWEL NUTRITION STATUS      Continent Diet(See dc summary)  AMBULATORY STATUS COMMUNICATION OF NEEDS Skin    Extensive Assist(In wheel chair can transfer. ) Verbally PU Stage and Appropriate Care, Hydro Therapy     WOC Nurse wound consult note Reason for Consult:right buttock wounds, proximal and distal. Stage 4 pressure injuries with tracking to rectum.  Proximal and distal wound communicate.  Proximal wound is 100% necrotic. Wound was debrided yesterday pm by Dr. Carolynne Edouard. Wound type:Pressure, infectious Pressure Injury POA: Yes Measurement: Proximal right buttock wound:  5.5cm x 2.5cm with true depth unable to be determined due to black, stringy necrotic/nonviable wound bed.  This wound communicates to the distal wound at 7 o'clock. Strong odor consistent with necrotic tissue is present-even after cleansing. Small amount of dried serosanguinous drainage from debridement yesterday is on old dressing.  Scant drainage (Frankson/grey) consistent with necrotic tissue today. Distal right buttock wound:  4cm x 2.5cm x 3cm with communication to proximal wound at 12 o'clock and deep tunnel toward rectum measuring 11cm at 7 o'clock. There are two ends of penrose drains protruding at 6 and 12 o'clock that are sutured in place.  Wound bed is red, moist and free of necrotic tissue. Scant serous drainage. Wound bed:As described above Drainage (amount, consistency, odor) As described above Periwound:Intact. Dressing procedure/placement/frequency: Patient is on a therapeutic mattress with low air loss feature. Recommend consideration for wide surgical debridement knowing this may require a diverting colostomy for healing. At this time, will support Hydrotherapy and CCS in topical wound care. Have ordered twice daily collagenase (Santyl) despite most common practice being once-daily as patient is incontinent and dressing may have to be changed prior to patient getting maximum benefit from enzymatic debriding agent.                     Personal Care Assistance Level of Assistance  Bathing, Feeding, Dressing Bathing Assistance: Limited assistance Feeding assistance: Independent Dressing Assistance: Limited assistance     Functional  Limitations Info  Sight, Hearing, Speech Sight Info: Adequate Hearing Info: Adequate Speech Info: Adequate    SPECIAL CARE FACTORS FREQUENCY  PT (By licensed PT), OT (By  licensed OT)     PT Frequency: 5x/week OT Frequency: 5x/week            Contractures Contractures Info: Not present    Additional Factors Info  Code Status, Allergies Code Status Info: Full Allergies Info: Vancomycin           Current Medications (01/30/2017):  This is the current hospital active medication list Current Facility-Administered Medications  Medication Dose Route Frequency Provider Last Rate Last Dose  . acetaminophen (TYLENOL) tablet 650 mg  650 mg Oral Q6H PRN Almond LintByerly, Faera, MD   650 mg at 01/30/17 1042   Or  . acetaminophen (TYLENOL) suppository 650 mg  650 mg Rectal Q6H PRN Almond LintByerly, Faera, MD      . Ampicillin-Sulbactam (UNASYN) 3 g in sodium chloride 0.9 % 100 mL IVPB  3 g Intravenous Q6H Almond LintByerly, Faera, MD   Stopped at 01/30/17 1132  . calcium carbonate (TUMS - dosed in mg elemental calcium) chewable tablet 200 mg of elemental calcium  1 tablet Oral TID WC PRN Sherrie GeorgeJennings, Willard, PA-C   200 mg of elemental calcium at 01/28/17 2221  . collagenase (SANTYL) ointment   Topical BID Sherrie GeorgeJennings, Willard, PA-C      . DAPTOmycin (CUBICIN) 500 mg in sodium chloride 0.9 % IVPB  500 mg Intravenous Q24H Almond LintByerly, Faera, MD   Stopped at 01/29/17 2357  . diphenhydrAMINE (BENADRYL) 12.5 MG/5ML elixir 12.5 mg  12.5 mg Oral Q6H PRN Almond LintByerly, Faera, MD   12.5 mg at 01/29/17 2355   Or  . diphenhydrAMINE (BENADRYL) injection 12.5 mg  12.5 mg Intravenous Q6H PRN Almond LintByerly, Faera, MD      . enoxaparin (LOVENOX) injection 40 mg  40 mg Subcutaneous Q24H Almond LintByerly, Faera, MD   40 mg at 01/25/17 1012  . HYDROmorphone (DILAUDID) injection 0.5-2 mg  0.5-2 mg Intravenous Q4H PRN Almond LintByerly, Faera, MD      . iopamidol (ISOVUE-300) 61 % injection 15 mL  15 mL Oral BID PRN Sherrie GeorgeJennings, Willard, PA-C      . ketorolac (TORADOL) 30 MG/ML injection 30 mg  30 mg Intravenous Q6H PRN Almond LintByerly, Faera, MD   30 mg at 01/27/17 0629  . methocarbamol (ROBAXIN) tablet 500 mg  500 mg Oral Q6H PRN Almond LintByerly, Faera, MD      .  metoprolol tartrate (LOPRESSOR) injection 5 mg  5 mg Intravenous Q6H PRN Almond LintByerly, Faera, MD      . multivitamin with minerals tablet 1 tablet  1 tablet Oral Daily Luretha MurphyMartin, Matthew, MD   1 tablet at 01/30/17 [phone]  . ondansetron (ZOFRAN-ODT) disintegrating tablet 4 mg  4 mg Oral Q6H PRN Almond LintByerly, Faera, MD       Or  . ondansetron (ZOFRAN) injection 4 mg  4 mg Intravenous Q6H PRN Almond LintByerly, Faera, MD   4 mg at 01/28/17 0319  . oxyCODONE (Oxy IR/ROXICODONE) immediate release tablet 5-10 mg  5-10 mg Oral Q4H PRN Almond LintByerly, Faera, MD      . protein supplement (PREMIER PROTEIN) liquid  11 oz Oral Q24H Focht, Jessica L, PA   11 oz at 01/30/17 1245  . senna (SENOKOT) tablet 8.6 mg  1 tablet Oral BID Almond LintByerly, Faera, MD   8.6 mg at 01/25/17 1011  . zolpidem (AMBIEN) tablet 5 mg  5 mg Oral QHS PRN,MR X 1 Almond LintByerly, Faera, MD  5 mg at 01/27/17 2124     Discharge Medications: Please see discharge summary for a list of discharge medications.  Relevant Imaging Results:  Relevant Lab Results:   Additional Information ssn: [id number]  Coralyn Helling, LCSW

## 2017-01-30 NOTE — Progress Notes (Signed)
Nutrition Follow-up  DOCUMENTATION CODES:   Not applicable  INTERVENTION:    Discontinue 30 ml Prostat BID  Add Premier Protein Q24, each supplement provides 160kcal and 30g protein.   NUTRITION DIAGNOSIS:   Increased nutrient needs related to wound healing as evidenced by estimated needs.  Ongoing  GOAL:   Patient will meet greater than or equal to 90% of their needs  Meeting  MONITOR:   PO intake, Supplement acceptance, Weight trends, Labs, Skin  REASON FOR ASSESSMENT:   Malnutrition Screening Tool    ASSESSMENT:   Pt with PMH significant for Spina bifida chronically wheel chair bound. Presents this admission with 1-2 week history of drainage from perirectal region. Pt underwent incision and drainage of ischiorectal abscess 1/5.    Pt's appetite remains stable, continues to consume 100% of all meals. Pt drinking ProStat twice per day, but dislikes the taste. Offered to change Prostat BID to one premier a day. Pt amendable. Pt continues with hydrotherapy and will need ongoing mechanical debridement. Plan for discharge to SNF or with home health. No recent weight has been obtained.   Medications reviewed and include: MVi with minerals, IV abx Labs reviewed.   Diet Order:  Diet regular Room service appropriate? Yes; Fluid consistency: Thin  EDUCATION NEEDS:   Education needs have been addressed  Skin:  Skin Assessment: Skin Integrity Issues: Skin Integrity Issues:: Other (Comment), Incisions Incisions: buttocks Other: deep tissue injury buttocks  Last BM:  01/29/17  Height:   Ht Readings from Last 1 Encounters:  01/25/17 5\' 8"  (1.727 m)    Weight:   Wt Readings from Last 1 Encounters:  01/25/17 180 lb 12.4 oz (82 kg)    Ideal Body Weight:  70 kg  BMI:  Body mass index is 27.49 kg/m.  Estimated Nutritional Needs:   Kcal:  1750-1950 kcal/day  Protein:  85-95 g/day  Fluid:  >1.7 L/day    Vanessa Kickarly Kyndel Egger RD, LDN Clinical Nutrition Pager # -  [phone]

## 2017-01-30 NOTE — Progress Notes (Signed)
[facility]/Trauma Progress Note  6 Days Post-Op   Assessment/Plan Hypokalemia/hyponatremia - resolved Paraplegia wheelchair-bound-spina bifida. Chronic indwelling Foley catheter Chronic ulcer on the perineum Hypertension - lopressor PRN  Sepsis/Sirs - afebrile, WBC trending down, 10.9 on 01/11 Ischio rectal abscess, stage IV decub - S/P incision and drainage of complex ischio rectal abscess 01/24/17, Dr. Almond LintFaera Byerly.  FEN: regular diet ID: [id number] =>> day 5Daptomycin 01/05=>>Day 6 MULTIPLE ORGANISMS PRESENT, NONE PREDOMINANT   BACTEROIDES FRAGILIS   BETA LACTAMASE POSITIVE   No growth from the blood cultures and insignificant growth from the urine culture. DVT: Lovenox Foley: Chronic Follow-up: Dr. Almond LintFaera Byerly  Plan: Social worker consult pending [facility] Will evaluate wound with hydrotherapy today.    LOS: 6 days    Subjective:  CC: mild sacral pain  Pt denies fever, chills, nausea, vomiting. Pain is mild. Tolerating diet. Doing better. Left sided neck stiffness. He states it happens sometimes if he sleeps on his neck wrong.   Objective: Vital signs in last 24 hours: Temp:  [98.8 F (37.1 C)-99 F (37.2 C)] 98.9 F (37.2 C) (01/11 0532) Pulse Rate:  [85-91] 85 (01/11 0532) Resp:  [16-20] 16 (01/11 0532) BP: (101-135)/(56-79) 101/56 (01/11 0532) SpO2:  [98 %-100 %] 98 % (01/11 0532) Last BM Date: 01/29/17  Intake/Output from previous day: 01/10 0701 - 01/11 0700 In: 1160 [P.O.:960; IV Piggyback:200] Out: 5625 [Urine:5625] Intake/Output this shift: No intake/output data recorded.  PE: Gen:  Alert, NAD, pleasant, cooperative Card:  RRR, no M/G/R heard Pulm:  CTA, no W/R/R, effort normal Abd: Soft, NT/ND, +BS Skin: no rashes noted, warm and dry   Anti-infectives: Anti-infectives (From admission, onward)   Start     Dose/Rate Route Frequency Ordered Stop   01/25/17 2000  DAPTOmycin (CUBICIN) 460 mg in  sodium chloride 0.9 % IVPB  Status:  Discontinued     460 mg 218.4 mL/hr over 30 Minutes Intravenous Every 24 hours 01/24/17 2202 01/25/17 1007   01/25/17 2000  DAPTOmycin (CUBICIN) 500 mg in sodium chloride 0.9 % IVPB     500 mg 220 mL/hr over 30 Minutes Intravenous Every 24 hours 01/25/17 1007     01/25/17 0600  Ampicillin-Sulbactam (UNASYN) 3 g in sodium chloride 0.9 % 100 mL IVPB     3 g 200 mL/hr over 30 Minutes Intravenous Every 6 hours 01/25/17 0437     01/24/17 2030  DAPTOmycin (CUBICIN) 460 mg in sodium chloride 0.9 % IVPB     460 mg 218.4 mL/hr over 30 Minutes Intravenous STAT 01/24/17 2016 01/24/17 2130   01/24/17 2015  Ampicillin-Sulbactam (UNASYN) 3 g in sodium chloride 0.9 % 100 mL IVPB     3 g 200 mL/hr over 30 Minutes Intravenous STAT 01/24/17 2014 01/24/17 2110      Lab Results:  Recent Labs    01/28/17 0608 01/30/17 0558  WBC 11.4* 10.9*  HGB 9.8* 10.1*  HCT 30.6* 31.3*  PLT 518* 531*   BMET Recent Labs    01/28/17 0608 01/30/17 0558  NA 141 142  K 4.7 3.9  CL 111 110  CO2 26 26  GLUCOSE 112* 91  BUN 6 10  CREATININE 0.68 0.67  CALCIUM 8.5* 8.1*   PT/INR No results for input(s): LABPROT, INR in the last 72 hours. CMP     Component Value Date/Time   NA 142 01/30/2017 0558   K 3.9 01/30/2017 0558   CL 110 01/30/2017 0558   CO2 26  01/30/2017 0558   GLUCOSE 91 01/30/2017 0558   BUN 10 01/30/2017 0558   CREATININE 0.67 01/30/2017 0558   CREATININE 1.00 03/09/2014 1633   CALCIUM 8.1 (L) 01/30/2017 0558   PROT 7.0 01/24/2017 1629   ALBUMIN 2.5 (L) 01/24/2017 1629   AST 21 01/24/2017 1629   ALT 21 01/24/2017 1629   ALKPHOS 69 01/24/2017 1629   BILITOT 0.6 01/24/2017 1629   GFRNONAA >60 01/30/2017 0558   GFRAA >60 01/30/2017 0558   Lipase  No results found for: LIPASE  Studies/Results: No results found.    Jerre Simon , [facility] 01/30/2017, 10:18 AM Pager: [phone] Consults: [phone] Mon-Fri 7:00  am-4:30 pm Sat-Sun 7:00 am-11:30 am

## 2017-01-30 NOTE — Clinical Social Work Note (Signed)
Clinical Social Work Assessment  Patient Details  Name: Anthony [name] MRN: [id number] Date of Birth: 1970-04-12  Date of referral:  01/30/17               Reason for consult:  Facility Placement, Discharge Planning                Permission sought to share information with:  Case Manager, Family Supports Permission granted to share information::  Yes, Verbal Permission Granted  Name::     Higher education careers adviser::  SNF  Relationship::  Mother  Contact Information:     Housing/Transportation Living arrangements for the past 2 months:  Apartment Source of Information:  Patient Patient Interpreter Needed:  None Criminal Activity/Legal Involvement Pertinent to Current Situation/Hospitalization:  No - Comment as needed Significant Relationships:  Parents Lives with:  Self Do you feel safe going back to the place where you live?  Yes Need for family participation in patient care:  Yes (Comment)  Care giving concerns:  Patient lives alone and is unable to perform necessary wound care.    Social Worker assessment / plan:  LCSW following for SNF placement.   Pt is a 47 yo M who presents with a 1-2 week history of drainage from his perirectal regions.  He has spina bifida aperta with paraplegia and chronic indwelling suprapubic catheter.  Patient admitted for Ischiorectal abscess s/p I&D.   LCSW met with patient at bedside. No family present. LCSW explained role and reason for visit.   PT is recommending hydrotherapy for sacral wounds. Patient is agreeable to SNF for wound care.   Patient reports that he lives alone and is independent in his ADLs. Patient uses a wheelchair at baseline.   PLAN: Patient will go to SNF at dc. Patient prefers [facility].     Employment status:  Disabled (Comment on whether or not currently receiving Disability) Insurance information:  Medicare PT Recommendations:  [place] / Referral to community resources:  [place]  Patient/Family's Response to care:  Patient is proactive in care. Patient has questions about his wheelchair. LCSW explained that case managers can help with equipment. Patient thankful of LCSW visit.   Patient/Family's Understanding of and Emotional Response to Diagnosis, Current Treatment, and Prognosis:  Patient is understanding of current diagnosis and agreeable to treatment plan.   Emotional Assessment Appearance:  Appears stated age Attitude/Demeanor/Rapport:    Affect (typically observed):  Calm Orientation:  Oriented to Self, Oriented to Place, Oriented to  Time, Oriented to Situation Alcohol / Substance use:  Not Applicable Psych involvement (Current and /or in the community):  No (Comment)  Discharge Needs  Concerns to be addressed:  No discharge needs identified Readmission within the last 30 days:  No Current discharge risk:  None Barriers to Discharge:  Continued Medical Work up   [employer], LCSW 01/30/2017, 2:00 PM

## 2017-01-30 NOTE — Progress Notes (Signed)
Physical Therapy Hydrotherapy Treatment Note  Surgical PA in to see prior to starting session.  Pt with new green drainage today.  Proximal penrose drain unattached on arrival, so removed from wound.    01/30/17 1429  Subjective Assessment  Subjective Pt reports he has been turning and repositioning frequently.  Prior Treatments S/P incision and drainage of complex ischio rectal abscess 01/24/17  Evaluation and Treatment  Evaluation and Treatment Procedures Explained to Patient/Family Yes  Evaluation and Treatment Procedures agreed to  Pressure Injury 01/28/17 Unstageable - Full thickness tissue loss in which the base of the ulcer is covered by slough (yellow, tan, gray, green or [place]inweber) and/or eschar (tan, Earlywine or black) in the wound bed. HYDRO  Date First Assessed/Time First Assessed: 01/28/17 1150   Location: Buttocks  Location Orientation: Right;Proximal  Staging: Unstageable - Full thickness tissue loss in which the base of the ulcer is covered by slough (yellow, tan, gray, green or [place]ixon...  Dressing Type ABD;Gauze (Comment);Moist to dry;Paper tape (kerlix, santyl)  Dressing Changed  Dressing Change Frequency PRN  State of Healing Eschar  Site / Wound Assessment Black;[place]  % Wound base Yellow/Fibrinous Exudate 20%  % Wound base Black/Eschar 80%  Peri-wound Assessment Intact;Erythema (non-blanchable)  Tunneling (cm) (see evaluation note, packing to tunnels performed)  Margins Unattached edges (unapproximated)  Drainage Amount Copious  Drainage Description Serosanguineous;Odor;Green  Treatment Debridement (Selective);Hydrotherapy (Pulse lavage);Packing (Saline gauze)  Hydrotherapy  Pulsed lavage therapy - wound location proximal R buttocks  Pulsed Lavage with Suction (psi) 8 psi  Pulsed Lavage with Suction - Normal Saline Used 1000 mL  Pulsed Lavage Tip Tip with splash shield  Selective Debridement  Selective Debridement - Location proximal R buttocks  Selective Debridement -  Tools Used Scissors;Forceps  Selective Debridement - Tissue Removed necrotic tissue  Wound Therapy - Assess/Plan/Recommendations  Wound Therapy - Clinical Statement Pt would benefit from hydrotherapy to facilitate wound healing by removing necrotic tissue and decreasing bioburden.  Wound Therapy - Functional Problem List hx spina bifida, w/c bound, no sensation or motor function of LEs  Factors Delaying/Impairing Wound Healing Immobility;Incontinence  Hydrotherapy Plan Dressing change;Debridement;Patient/family education;Pulsatile lavage with suction  Wound Therapy - Frequency 6X / week  Wound Therapy - Current Recommendations (WOC and surgery involved)  Wound Therapy - Follow Up Recommendations Skilled nursing facility  Wound Plan assist with wound healing of proximal wound as outlined above  Wound Therapy Goals - Improve the function of patient's integumentary system by progressing the wound(s) through the phases of wound healing by:  Decrease Necrotic Tissue to 20  Decrease Necrotic Tissue - Progress Progressing toward goal  Increase Granulation Tissue to 80  Increase Granulation Tissue - Progress Progressing toward goal  Improve Drainage Characteristics Min  Improve Drainage Characteristics - Progress Not progressing  Time For Goal Achievement 2 weeks  Wound Therapy - Potential for Goals Fair   Time: [id number]  28 minutes  Zenovia JarredKati Kimball Appleby, PT, DPT 01/30/2017 Pager: [phone]

## 2017-01-31 LAB — CREATININE, SERUM
Creatinine, Ser: 0.71 mg/dL (ref 0.61–1.24)
GFR calc non Af Amer: >60 mL/min (ref >60)

## 2017-01-31 NOTE — Progress Notes (Signed)
Physical Therapy Hydrotherapy Treatment Note  Pt AxO x 4 tolerated well Pt stated "can't use skin prep, I'm allergic"    01/31/17 1  Subjective Assessment  Subjective Pt reports he has been turning and repositioning frequently.  Prior Treatments S/P incision and drainage of complex ischio rectal abscess 01/24/17  Evaluation and Treatment  Evaluation and Treatment Procedures Explained to Patient/Family Yes  Evaluation and Treatment Procedures agreed to  Pressure Injury 01/28/17 Unstageable - Full thickness tissue loss in which the base of the ulcer is covered by slough (yellow, tan, gray, green or [place]) and/or eschar (tan, Blacksher or black) in the wound bed. HYDRO  Date First Assessed/Time First Assessed: 01/28/17 1150   Location: Buttocks  Location Orientation: Right;Proximal  Staging: Unstageable - Full thickness tissue loss in which the base of the ulcer is covered by slough (yellow, tan, gray, green or Heinsohn...  Dressing Type ABD;Gauze (Comment);Moist to dry;Paper tape (kerlix, santyl)  Dressing Changed  Dressing Change Frequency PRN  State of Healing Eschar  Site / Wound Assessment Black;Rogowski  % Wound base Yellow/Fibrinous Exudate 20%  % Wound base Black/Eschar 80%  Peri-wound Assessment Intact;Erythema (non-blanchable)  Tunneling (cm) (see evaluation note, packing to tunnels performed)  Margins Unattached edges (unapproximated)  Drainage Amount Copious  Drainage Description Serosanguineous;Odor;Green  Treatment Debridement (Selective);Hydrotherapy (Pulse lavage);Packing (Saline gauze)  Hydrotherapy  Pulsed lavage therapy - wound location proximal R buttocks  Pulsed Lavage with Suction (psi) 8 psi  Pulsed Lavage with Suction - Normal Saline Used 1000 mL  Pulsed Lavage Tip Tip with splash shield  Selective Debridement  Selective Debridement - Location proximal R buttocks  Selective Debridement - Tools Used Scissors;Forceps  Selective Debridement - Tissue Removed necrotic  tissue  Wound Therapy - Assess/Plan/Recommendations  Wound Therapy - Clinical Statement Pt would benefit from hydrotherapy to facilitate wound healing by removing necrotic tissue and decreasing bioburden.  Wound Therapy - Functional Problem List hx spina bifida, w/c bound, no sensation or motor function of LEs  Factors Delaying/Impairing Wound Healing Immobility;Incontinence  Hydrotherapy Plan Dressing change;Debridement;Patient/family education;Pulsatile lavage with suction  Wound Therapy - Frequency 6X / week  Wound Therapy - Current Recommendations (WOC and surgery involved)  Wound Therapy - Follow Up Recommendations Skilled nursing facility  Wound Plan assist with wound healing of proximal wound as outlined above  Wound Therapy Goals - Improve the function of patient's integumentary system by progressing the wound(s) through the phases of wound healing by:  Decrease Necrotic Tissue to 20  Decrease Necrotic Tissue - Progress Progressing toward goal  Increase Granulation Tissue to 80  Increase Granulation Tissue - Progress Progressing toward goal  Improve Drainage Characteristics Min  Improve Drainage Characteristics - Progress Not progressing  Time For Goal Achievement 2 weeks  Wound Therapy - Potential for Goals Fair    13:45 - 14:10  Felecia ShellingLori Bharat Antillon  PTA [facility]  Acute  Rehab Pager      [phone]

## 2017-02-01 LAB — CK: CK TOTAL: 19 U/L — AB (ref 49–397)

## 2017-02-02 MED ORDER — AMOXICILLIN-POT CLAVULANATE 875-125 MG PO TABS
1.0000 | ORAL_TABLET | Freq: Two times a day (BID) | ORAL | Status: DC
  Administered 2017-02-02 – 2017-02-03 (×3): 1 via ORAL
  Filled 2017-02-02 (×3): qty 1

## 2017-02-02 MED ORDER — SENNA 8.6 MG PO TABS: 1.0000 | ORAL_TABLET | Freq: Two times a day (BID) | ORAL | 0 refills | Status: DC

## 2017-02-02 MED ORDER — COLLAGENASE 250 UNIT/GM EX OINT: TOPICAL_OINTMENT | Freq: Two times a day (BID) | CUTANEOUS | 0 refills | Status: DC

## 2017-02-02 MED ORDER — PREMIER PROTEIN SHAKE: 11.0000 [oz_av] | ORAL | 0 refills | Status: DC

## 2017-02-02 MED ORDER — AMOXICILLIN-POT CLAVULANATE 875-125 MG PO TABS: 1.0000 | ORAL_TABLET | Freq: Two times a day (BID) | ORAL | 0 refills | Status: AC

## 2017-02-02 MED ORDER — OXYCODONE HCL 5 MG PO TABS: 5.0000 mg | ORAL_TABLET | ORAL | 0 refills | Status: DC | PRN

## 2017-02-02 MED ORDER — DOXYCYCLINE HYCLATE 100 MG PO TABS: 100.0000 mg | ORAL_TABLET | Freq: Two times a day (BID) | ORAL | 0 refills | Status: AC

## 2017-02-02 MED ORDER — DOXYCYCLINE HYCLATE 100 MG PO TABS
100.0000 mg | ORAL_TABLET | Freq: Two times a day (BID) | ORAL | Status: DC
  Administered 2017-02-02 – 2017-02-03 (×3): 100 mg via ORAL
  Filled 2017-02-02 (×3): qty 1

## 2017-02-02 MED ORDER — ACETAMINOPHEN 650 MG RE SUPP: 650.0000 mg | Freq: Four times a day (QID) | RECTAL | 0 refills | Status: DC | PRN

## 2017-02-02 MED ORDER — CALCIUM CARBONATE ANTACID 500 MG PO CHEW: 1.0000 | CHEWABLE_TABLET | Freq: Three times a day (TID) | ORAL | Status: DC | PRN

## 2017-02-02 MED ORDER — ACETAMINOPHEN 325 MG PO TABS: 650.0000 mg | ORAL_TABLET | Freq: Four times a day (QID) | ORAL | Status: DC | PRN

## 2017-02-02 NOTE — Progress Notes (Signed)
[facility] Progress Note  9 Days Post-Op  Subjective: CC-  No complaints this morning. Tolerating hydrotherapy and dressing changes well. VSS. Tolerating diet and having bowel function.  Objective: Vital signs in last 24 hours: Temp:  [97.9 F (36.6 C)-99.7 F (37.6 C)] 97.9 F (36.6 C) (01/14 0600) Pulse Rate:  [77-90] 77 (01/14 0600) Resp:  [18] 18 (01/13 2153) BP: (112-137)/(70-88) 116/74 (01/14 0600) SpO2:  [94 %-98 %] 98 % (01/14 0600) Last BM Date: 01/30/17  Intake/Output from previous day: 01/13 0701 - 01/14 0700 In: 480 [P.O.:480] Out: 600 [Urine:600] Intake/Output this shift: No intake/output data recorded.  PE: Gen:  Alert, NAD, pleasant HEENT: EOM's intact, pupils equal and round Pulm:  effort normal Psych: A&Ox3  MSK: calves soft and nontender GU:     Lab Results:  No results for input(s): WBC, HGB, HCT, PLT in the last 72 hours. BMET Recent Labs    01/31/17 0543  CREATININE 0.71   PT/INR No results for input(s): LABPROT, INR in the last 72 hours. CMP     Component Value Date/Time   NA 142 01/30/2017 0558   K 3.9 01/30/2017 0558   CL 110 01/30/2017 0558   CO2 26 01/30/2017 0558   GLUCOSE 91 01/30/2017 0558   BUN 10 01/30/2017 0558   CREATININE 0.71 01/31/2017 0543   CREATININE 1.00 03/09/2014 1633   CALCIUM 8.1 (L) 01/30/2017 0558   PROT 7.0 01/24/2017 1629   ALBUMIN 2.5 (L) 01/24/2017 1629   AST 21 01/24/2017 1629   ALT 21 01/24/2017 1629   ALKPHOS 69 01/24/2017 1629   BILITOT 0.6 01/24/2017 1629   GFRNONAA >60 01/31/2017 0543   GFRAA >60 01/31/2017 0543   Lipase  No results found for: LIPASE     Studies/Results: No results found.  Anti-infectives: Anti-infectives (From admission, onward)   Start     Dose/Rate Route Frequency Ordered Stop   01/25/17 2000  DAPTOmycin (CUBICIN) 460 mg in sodium chloride 0.9 % IVPB  Status:  Discontinued     460 mg 218.4 mL/hr over 30 Minutes Intravenous Every 24 hours  01/24/17 2202 01/25/17 1007   01/25/17 2000  DAPTOmycin (CUBICIN) 500 mg in sodium chloride 0.9 % IVPB     500 mg 220 mL/hr over 30 Minutes Intravenous Every 24 hours 01/25/17 1007     01/25/17 0600  Ampicillin-Sulbactam (UNASYN) 3 g in sodium chloride 0.9 % 100 mL IVPB     3 g 200 mL/hr over 30 Minutes Intravenous Every 6 hours 01/25/17 0437     01/24/17 2030  DAPTOmycin (CUBICIN) 460 mg in sodium chloride 0.9 % IVPB     460 mg 218.4 mL/hr over 30 Minutes Intravenous STAT 01/24/17 2016 01/24/17 2130   01/24/17 2015  Ampicillin-Sulbactam (UNASYN) 3 g in sodium chloride 0.9 % 100 mL IVPB     3 g 200 mL/hr over 30 Minutes Intravenous STAT 01/24/17 2014 01/24/17 2110       Assessment/Plan Hypokalemia/hyponatremia - resolved Paraplegia wheelchair-bound-spina bifida. Suprapubic catheter Chronic ulcer on the perineum Hypertension - lopressor PRN  Sepsis/Sirs - afebrile, WBC trending down, 10.9 on 01/11 Ischio rectal abscess, stage IV decub - S/P incision and drainage of complex ischio rectal abscess 01/24/17, Dr. Almond LintFaera Byerly. - undergoing hydrotherapy and BID wet to dry dressing changes  FEN: regular diet ZO:XWRUEA5/40>>9/81XBJYNWGNFA:Unasyn1/06>>1/14Daptomycin 01/05>>1/14 MULTIPLE ORGANISMS PRESENT, NONE PREDOMINANT BACTEROIDES FRAGILIS and BETA LACTAMASE POSITIVENo growth from the blood cultures and insignificant growth from the urine culture. DVT: Lovenox Foley: suprapubic catheter Follow-up:  Dr. Almond Lint  Plan: Spoke with ID for abx recommendations, will switch to Augmentin and Doxycycline x2 weeks. Continue hydrotherapy and BID wet to dry dressing changes. Will ask OT to see for equipment recommendations.  After OT consult patient will be stable and ready for discharge to SNF once bed available. He will follow up with Dr. Donell Beers in about 1 week.   LOS: 9 days    Franne Forts , [facility] 02/02/2017, 10:34 AM Pager: [phone] Consults: [phone] Mon-Fri  7:00 am-4:30 pm Sat-Sun 7:00 am-11:30 am

## 2017-02-02 NOTE — Discharge Instructions (Addendum)
Continue hydrotherapy Twice daily wet to dry dressing changes as below, with Santyl Keep pressure off wound Follow up as scheduled Take antibiotics as prescribed   WOUND CARE: - dressing to be changed twice daily - supplies: sterile saline, kerlix, scissors, ABD pads, tape  - remove dressing and all packing carefully, moistening with sterile saline as needed to avoid packing/internal dressing sticking to the wound. - clean edges of skin around the wound with water/gauze, making sure there is no tape debris or leakage left on skin that could cause skin irritation or breakdown. - dampen and clean kerlix with sterile saline and pack wound from wound base to skin level, making sure to take note of any possible areas of wound tracking, tunneling and packing appropriately. Wound can be packed loosely. Trim kerlix to size if a whole kerlix is not required. - cover wound with a dry ABD pad and secure with tape.  - write the date/time on the dry dressing/tape to better track when the last dressing change occurred. - change dressing as needed if leakage occurs, wound gets contaminated, or patient requests to shower. - patient may shower daily with wound open and following the shower the wound should be dried and a clean dressing placed.

## 2017-02-02 NOTE — Progress Notes (Signed)
LCSW following for SNF Placement.   [facility].   Facility can take him tomorrow. Facility is waiting on air mattress to arrive.   LCSW will continue to follow.  Beulah GandyBernette Jeny Nield, LSCW [name] [phone]

## 2017-02-02 NOTE — Progress Notes (Signed)
Physical Therapy Hydrotherapy Treatment  Pt's proximal and distal wound are now open (connecting piece of skin tissue opened).  Pt's distal penrose drain not visible after packing lower tunnel (RN informed).  Pt also with healed area under rectum which he reports sometimes he urinates from if foley clogs.  Pt with small amount of drainage exiting from this site (appears light yellow and white (?pus) after performing hydrotherapy treatment.   02/02/17 1300  Subjective Assessment  Subjective Pt seen by surgery this morning.  Pt reports his connecting wounds are now open.  Prior Treatments S/P incision and drainage of complex ischio rectal abscess 01/24/17  Evaluation and Treatment  Evaluation and Treatment Procedures Explained to Patient/Family Yes  Evaluation and Treatment Procedures agreed to  Pressure Injury 01/28/17 Unstageable - Full thickness tissue loss in which the base of the ulcer is covered by slough (yellow, tan, gray, green or Raimer) and/or eschar (tan, Lastinger or black) in the wound bed. HYDRO  Date First Assessed/Time First Assessed: 01/28/17 1150   Location: Buttocks  Location Orientation: Right;Proximal  Staging: Unstageable - Full thickness tissue loss in which the base of the ulcer is covered by slough (yellow, tan, gray, green or Platte...  Dressing Type ABD;Gauze (Comment);Moist to dry;Paper tape (kerlix, santyl)  Dressing Changed  Dressing Change Frequency PRN  State of Healing Eschar  Site / Wound Assessment Red;Pink;Yellow  % Wound base Red or Granulating 50%  % Wound base Yellow/Fibrinous Exudate 50%  Peri-wound Assessment Intact;Erythema (non-blanchable)  Tunneling (cm) see evaluation note, packing to tunnel performed  Margins Unattached edges (unapproximated)  Drainage Amount Moderate  Drainage Description Serosanguineous;Odor  Treatment Debridement (Selective);Hydrotherapy (Pulse lavage);Packing (Saline gauze)  Hydrotherapy  Pulsed lavage therapy - wound location  proximal R buttocks  Pulsed Lavage with Suction (psi) 8 psi  Pulsed Lavage with Suction - Normal Saline Used 1000 mL  Pulsed Lavage Tip Tip with splash shield  Selective Debridement  Selective Debridement - Location proximal R buttocks  Selective Debridement - Tools Used Scissors;Forceps  Selective Debridement - Tissue Removed necrotic tissue, black eschar from around wound edges, more yellow slough from wound base  Wound Therapy - Assess/Plan/Recommendations  Wound Therapy - Clinical Statement Pt would benefit from hydrotherapy to facilitate wound healing by removing necrotic tissue and decreasing bioburden.  Wound Therapy - Functional Problem List hx spina bifida, w/c bound, no sensation or motor function of LEs  Factors Delaying/Impairing Wound Healing Immobility;Incontinence  Hydrotherapy Plan Dressing change;Debridement;Patient/family education;Pulsatile lavage with suction  Wound Therapy - Frequency 6X / week  Wound Therapy - Current Recommendations (WOC and surgery involved)  Wound Therapy - Follow Up Recommendations Skilled nursing facility  Wound Plan assist with wound healing of proximal wound as outlined above  Wound Therapy Goals - Improve the function of patient's integumentary system by progressing the wound(s) through the phases of wound healing by:  Decrease Necrotic Tissue to 20  Decrease Necrotic Tissue - Progress Progressing toward goal  Increase Granulation Tissue to 80  Increase Granulation Tissue - Progress Progressing toward goal  Improve Drainage Characteristics Min  Improve Drainage Characteristics - Progress Progressing toward goal  Goals/treatment plan/discharge plan were made with and agreed upon by patient/family Yes  Time For Goal Achievement 2 weeks  Wound Therapy - Potential for Goals Fair  Time: 1200-1230  30 minutes  Zenovia JarredKati Kolton Kienle, PT, DPT 02/02/2017 Pager: [phone]

## 2017-02-02 NOTE — Progress Notes (Signed)
Physician Discharge Summary  Patient ID: Anthony [name] MRN: [id number] DOB/AGE: [birth year] 47 y.o.  Admit date: 01/24/2017 Discharge date: 02/03/2017   Admission Diagnoses:  Sepsis/Sirs Ischio rectal abscess, stage IV decub Paraplegia wheelchair-bound - spina bifida. Chronic indwelling Foley catheter Wheelchair-bound Chronic ulcer on the perineum Hypertension   Discharge Diagnoses:  Sepsis/Sirs Ischio rectal abscess, stage IV decub Paraplegia wheelchair-bound - spina bifida. Chronic indwelling Foley catheter Wheelchair-bound Chronic ulcer on the perineum Hypertension Hyponatremia - resolved   Principal Problem:   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/24/2017 Active Problems:   Essential hypertension   Pressure ulcer   Spina bifida aperta of lumbar spine (HCC)   Status post debridement   Pressure injury of skin   Paraplegia (HCC)   PROCEDURES:  Incision and drainage of complex ischio rectal abscess 01/24/17, Dr. Almond Lint Bedside debridement of the decubitus 01/26/17, Dr. Hortense Ramal III   Hospital Course:  Pt is a 47 yo M who presents with a 1-2 week history of drainage from his perirectal regions.  He has spina bifida aperta with paraplegia and chronic indwelling suprapubic catheter.  He started having fevers and chills around a week ago.  He was seen in urology yesterday and CT showed bilateral gas and fluid, R >> L.  He was advised to go to the ED yesterday, but it was later in the afternoon and he wanted to wait.  He is quite mobile in the wheelchair despite his paraplegia.  He has some pain, but not a lot in this area.   He is quite hypokalemic in the ED.  He is receiving IV K. Patient was seen and admitted by Dr. Almond Lint.  He was taken to the operating room that day and underwent incision and drainage of a complex ischio rectal abscess.  He tolerated this well.  Dressing changes were started 24 hours later.  He was found to have a stage IV decubitus ulcer which was  debrided bedside by Dr. Carolynne Edouard on 01/26/17.  He was started on hydrotherapy.  Antibiotic therapy included Unasyn and daptomycin.  He showed good improvement and this was converted to oral Augmentin and doxycycline, Dr. Feliz Beam recommendation with a recommendation for 2 more weeks of therapy.  His wound was cleaning up slowly with hydrotherapy. Patient previously been living independently But has now reached a point where he needs assistance at skilled nursing facility was thought to be his only option.  Case manager and social worker were both asked to see and assist with discharge planning.  Currently the wounds are showing good improvement he still has some necrosis of the upper bedside debridement decubitus.  This is contiguous with the I&D done by Dr. Donell Beers.  Penrose drains are still in place that Dr. Donell Beers placed on 01/24/17.  He will need ongoing hydrotherapy and wet-to-dry dressings to the 2 open sites on his thigh. The skilled nursing facility we anticipate discharge to is able to do hydrotherapy.  Follow-up has been arranged with Dr. Donell Beers on 02/12/17.  He also continue care of his primary care physician. He has an indwelling foley that will need to be changed and he does it at home the first of each month.  He reports the last change was 01/20/17.  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 Sebastian Ache.  It was his opinion that he had a stable up-to-date on his SPT change in  labs imaging and surveillance.  He wanted to continue the monthly changes of his Foley.  Ischio rectal abscess was healing well by exam and agreed with wound care at this point.  He noted that the urethral bulb diverticulum with a fistula is stable he has some lower urethral patency and ask as a pop-off to prevent obstructive uropathy in case his suprapubic catheter becomes clogged.  No intervention was  required and was his opinion he could be discharged to rehab.  He agreed with continued monthly changes to his suprapubic catheter which he can assist with.  CBC Latest Ref Rng & Units 01/30/2017 01/28/2017 01/26/2017  WBC 4.0 - 10.5 K/uL 10.9(H) 11.4(H) 12.3(H)  Hemoglobin 13.0 - 17.0 g/dL 10.1(L) 9.8(L) 8.7(L)  Hematocrit 39.0 - 52.0 % 31.3(L) 30.6(L) 25.6(L)  Platelets 150 - 400 K/uL 531(H) 518(H) 408(H)   CMP Latest Ref Rng & Units 01/31/2017 01/30/2017 01/28/2017  Glucose 65 - 99 mg/dL - 91 409(W)  BUN 6 - 20 mg/dL - 10 6  Creatinine 1.19 - 1.24 mg/dL 1.47 8.29 5.62  Sodium 135 - 145 mmol/L - 142 141  Potassium 3.5 - 5.1 mmol/L - 3.9 4.7  Chloride 101 - 111 mmol/L - 110 111  CO2 22 - 32 mmol/L - 26 26  Calcium 8.9 - 10.3 mg/dL - 8.1(L) 8.5(L)  Total Protein 6.5 - 8.1 g/dL - - -  Total Bilirubin 0.3 - 1.2 mg/dL - - -  Alkaline Phos 38 - 126 U/L - - -  AST 15 - 41 U/L - - -  ALT 17 - 63 U/L - - -   CT scan obtained on 01/26/17 showed a decrease in the size of the abscess at the medial aspect of the right buttocks, status post debridement placement of drains.  Generally decreased amount of subcutaneous emphysema throughout the soft tissue of the back with indwelling drain at the level of the coccyx.  There were reactive retroperitoneal and pelvic lymphadenopathy, some neuropathic arthropathy of both hips and small bilateral pleural effusions.  Prior to Admission medications   Medication Sig Start Date End Date Taking? Authorizing Provider  Catheters (FOLEY CATHETER 2-WAY) MISC Use as directed by urologist. 12/30/12  Yes Leona Singleton, MD  cephALEXin (KEFLEX) 500 MG capsule Take 500 mg by mouth 4 (four) times daily.   Yes [provider]  lisinopril-hydrochlorothiazide (PRINZIDE,ZESTORETIC) 20-25 MG tablet TAKE 1 TABLET BY MOUTH DAILY. TAKE 1/2 TABLET AT BEDTIME. 11/10/16  Yes Mikell, Antionette Poles, MD  Multiple Vitamin (MULTIVITAMIN WITH MINERALS) TABS tablet Take 1 tablet by  mouth daily.   Yes [provider]    Condition on discharge: Improving. CBC Latest Ref Rng & Units 01/30/2017 01/28/2017 01/26/2017  WBC 4.0 - 10.5 K/uL 10.9(H) 11.4(H) 12.3(H)  Hemoglobin 13.0 - 17.0 g/dL 10.1(L) 9.8(L) 8.7(L)  Hematocrit 39.0 - 52.0 % 31.3(L) 30.6(L) 25.6(L)  Platelets 150 - 400 K/uL 531(H) 518(H) 408(H)    CMP Latest Ref Rng & Units 01/31/2017 01/30/2017 01/28/2017  Glucose 65 - 99 mg/dL - 91 130(Q)  BUN 6 - 20 mg/dL - 10 6  Creatinine 6.57 - 1.24 mg/dL 8.46 9.62 9.52  Sodium 135 - 145 mmol/L - 142 141  Potassium 3.5 - 5.1 mmol/L - 3.9 4.7  Chloride 101 - 111 mmol/L - 110 111  CO2 22 - 32 mmol/L - 26 26  Calcium 8.9 - 10.3 mg/dL - 8.1(L) 8.5(L)  Total Protein 6.5 - 8.1 g/dL - - -  Total Bilirubin 0.3 - 1.2  mg/dL - - -  Alkaline Phos 38 - 126 U/L - - -  AST 15 - 41 U/L - - -  ALT 17 - 63 U/L - - -  Neurosurgery Disposition: Everyone's is an moan about who is going to take care of this poor woman   Allergies as of 02/03/2017      Reactions   Vancomycin Itching      Medication List    STOP taking these medications   cephALEXin 500 MG capsule Commonly known as:  KEFLEX     TAKE these medications   acetaminophen 325 MG tablet Commonly known as:  TYLENOL Take 2 tablets (650 mg total) by mouth every 6 (six) hours as needed for mild pain, moderate pain, fever or headache (or Fever >/= 101).   amoxicillin-clavulanate 875-125 MG tablet Commonly known as:  AUGMENTIN Take 1 tablet by mouth every 12 (twelve) hours for 14 days.   calcium carbonate 500 MG chewable tablet Commonly known as:  TUMS - dosed in mg elemental calcium Chew 1 tablet (200 mg of elemental calcium total) by mouth 3 (three) times daily with meals as needed for indigestion or heartburn.   collagenase ointment Commonly known as:  SANTYL Apply topically 2 (two) times daily. Apply to decubitus tissue with hydro therapy dressing change.   doxycycline 100 MG tablet Commonly known as:   VIBRA-TABS Take 1 tablet (100 mg total) by mouth every 12 (twelve) hours for 14 days.   Foley Catheter 2-Way Misc Use as directed by urologist.   lisinopril-hydrochlorothiazide 20-25 MG tablet Commonly known as:  PRINZIDE,ZESTORETIC TAKE 1 TABLET BY MOUTH DAILY. TAKE 1/2 TABLET AT BEDTIME.   multivitamin with minerals Tabs tablet Take 1 tablet by mouth daily.   oxyCODONE 5 MG immediate release tablet Commonly known as:  Oxy IR/ROXICODONE Take 1-2 tablets (5-10 mg total) by mouth every 4 (four) hours as needed for moderate pain.   protein supplement shake Liqd Commonly known as:  PREMIER PROTEIN Take 325 mLs (11 oz total) by mouth daily.   senna 8.6 MG Tabs tablet Commonly known as:  SENOKOT Take 1 tablet (8.6 mg total) by mouth 2 (two) times daily.            Durable Medical Equipment  (From admission, onward)        Start     Ordered   02/03/17 1055  For home use only DME wheelchair cushion (seat and back)  Once     02/03/17 1057     Follow-up Information    Almond LintByerly, Faera, MD. Go on 02/12/2017.   Specialty:  General Surgery Why:  Your appointment is 02/12/2017 at 4:15PM. Please arrive 30 minutes prior to your appointment to check in and fill out paperwork. Bring photo ID and insurance information. Contact information: [address] [place] [address] [phone]        Berton BonMikell, Asiyah Zahra, MD. Call in 2 week(s).   Specialty:  Family Medicine Why:  Call to make a follow up appointment with your primary care physician Contact information: [address] [place] [address] [phone]           Signed: Sherrie GeorgeJENNINGS,Cope Marte 02/03/2017, 1:40 PM

## 2017-02-02 NOTE — Care Management Important Message (Signed)
Important Message  Patient Details  Name: Anthony [name] MRN: [id number] Date of Birth: December 03, 1970   Medicare Important Message Given:  Yes    Caren MacadamFuller, Pasty Manninen 02/02/2017, 11:04 AMImportant Message  Patient Details  Name: Anthony [name] MRN: [id number] Date of Birth: December 03, 1970   Medicare Important Message Given:  Yes    Caren MacadamFuller, Kerra Guilfoil 02/02/2017, 11:04 AM

## 2017-02-02 NOTE — Progress Notes (Signed)
OT  Note  Patient Details Name: Anthony [name] MRN: [id number] DOB: 1970-04-19   Cancelled Treatment:    Reason Eval/Treat Not Completed: Other (comment)  OT order received regarding equipment for pt.   OT spoke with pt and he wants a new WC.  Noted DC plan is SNF.  OT did text page PA regarding pts requests ( WC, new cushion- 16 by 18 ROHO).   OT called PT familiar with pt from wound care as well as CM in regards to situation.    Will defer formal OT eval at this time.  Thanks,  Dorena BodoREDDING, Anthony [name], [place] [phone] 02/02/2017, 2:49 PM

## 2017-02-02 NOTE — Progress Notes (Signed)
Date:  February 02, 2017 Chart reviewed for concurrent status and case management needs.  Will continue to follow patient progress.  Discharge Planning: following for needs.  None present at this time of review. Expected discharge date: January 172019 Rhonda Davis, BSN, RN3, CCM   [phone]  

## 2017-02-03 DIAGNOSIS — Z9359 Other cystostomy status: Secondary | ICD-10-CM | POA: Diagnosis not present

## 2017-02-03 DIAGNOSIS — N361 Urethral diverticulum: Secondary | ICD-10-CM | POA: Diagnosis not present

## 2017-02-03 DIAGNOSIS — L988 Other specified disorders of the skin and subcutaneous tissue: Secondary | ICD-10-CM | POA: Diagnosis not present

## 2017-02-03 DIAGNOSIS — S31809A Unspecified open wound of unspecified buttock, initial encounter: Secondary | ICD-10-CM | POA: Diagnosis not present

## 2017-02-03 DIAGNOSIS — L98419 Non-pressure chronic ulcer of buttock with unspecified severity: Secondary | ICD-10-CM | POA: Diagnosis not present

## 2017-02-03 DIAGNOSIS — I10 Essential (primary) hypertension: Secondary | ICD-10-CM | POA: Diagnosis not present

## 2017-02-03 DIAGNOSIS — Z9889 Other specified postprocedural states: Secondary | ICD-10-CM | POA: Diagnosis not present

## 2017-02-03 DIAGNOSIS — K5901 Slow transit constipation: Secondary | ICD-10-CM | POA: Diagnosis not present

## 2017-02-03 DIAGNOSIS — G8220 Paraplegia, unspecified: Secondary | ICD-10-CM | POA: Diagnosis not present

## 2017-02-03 DIAGNOSIS — L0231 Cutaneous abscess of buttock: Secondary | ICD-10-CM | POA: Diagnosis not present

## 2017-02-03 DIAGNOSIS — L8992 Pressure ulcer of unspecified site, stage 2: Secondary | ICD-10-CM | POA: Diagnosis not present

## 2017-02-03 DIAGNOSIS — L0291 Cutaneous abscess, unspecified: Secondary | ICD-10-CM | POA: Diagnosis not present

## 2017-02-03 DIAGNOSIS — K6139 Other ischiorectal abscess: Secondary | ICD-10-CM | POA: Diagnosis not present

## 2017-02-03 DIAGNOSIS — Q059 Spina bifida, unspecified: Secondary | ICD-10-CM | POA: Diagnosis not present

## 2017-02-03 DIAGNOSIS — Q057 Lumbar spina bifida without hydrocephalus: Secondary | ICD-10-CM | POA: Diagnosis not present

## 2017-02-03 DIAGNOSIS — N319 Neuromuscular dysfunction of bladder, unspecified: Secondary | ICD-10-CM | POA: Diagnosis not present

## 2017-02-03 NOTE — Progress Notes (Signed)
10 Days Post-Op    CC: Ischiorectal  abscess  Subjective: He is concerned about his foley being changed early next month,and is saying he had a second place where the urine came out around his rectum.  I am not sure what he is referring to.  Will sort out before he goes.  Hx of urethral diverticulum treated in 02/2005 by Dr. Brunilda PayorNesi and MacDiarmid. He had a Cystoscopy on March 11, 2005 which demonstrated no urethral strictures but a false passage in the bulbous urethra.  2.  Retrograde urethrogram which showed a large urethral      diverticulum/fistula extending into the left buttocks posterior to the      left hip as well as a small amount of vesicourethral reflux into the      distal right ureter. Cystoscopy, cystogram and insertion of suprapubic catheter 03/14/2005.  Objective: Vital signs in last 24 hours: Temp:  [98.3 F (36.8 C)-98.8 F (37.1 C)] 98.3 F (36.8 C) (01/15 0557) Pulse Rate:  [68-84] 68 (01/15 0557) Resp:  [18-20] 18 (01/15 0557) BP: (119-137)/(72-88) 119/79 (01/15 0557) SpO2:  [96 %-98 %] 98 % (01/15 0557) Last BM Date: 02/02/17 350 p.o. Recorded  3050 urine recorded Afebrile vital signs are stable No labs today. No films   Intake/Output from previous day: 01/14 0701 - 01/15 0700 In: 350 [P.O.:350] Out: 3050 [Urine:3050] Intake/Output this shift: No intake/output data recorded.  General appearance: alert, cooperative and no distress Resp: clear to auscultation bilaterally GI: soft, non-tender; bowel sounds normal; no masses,  no organomegaly  Skin:  Open areas has been completely opened, drains are now out.  He still has drainage at the base of the open wound.  It is clean and improving.    Lab Results:  No results for input(s): WBC, HGB, HCT, PLT in the last 72 hours.  BMET No results for input(s): NA, K, CL, CO2, GLUCOSE, BUN, CREATININE, CALCIUM in the last 72 hours. PT/INR No results for input(s): LABPROT, INR in the last 72 hours.  No  results for input(s): AST, ALT, ALKPHOS, BILITOT, PROT, ALBUMIN in the last 168 hours.   Lipase  No results found for: LIPASE   Medications: . amoxicillin-clavulanate  1 tablet Oral Q12H  . collagenase   Topical BID  . doxycycline  100 mg Oral Q12H  . enoxaparin (LOVENOX) injection  40 mg Subcutaneous Q24H  . multivitamin with minerals  1 tablet Oral Daily  . protein supplement shake  11 oz Oral Q24H  . senna  1 tablet Oral BID   Anti-infectives (From admission, onward)   Start     Dose/Rate Route Frequency Ordered Stop   02/02/17 1200  amoxicillin-clavulanate (AUGMENTIN) 875-125 MG per tablet 1 tablet     1 tablet Oral Every 12 hours 02/02/17 1053 02/16/17 0959   02/02/17 1200  doxycycline (VIBRA-TABS) tablet 100 mg     100 mg Oral Every 12 hours 02/02/17 1053 02/16/17 0959   02/02/17 0000  amoxicillin-clavulanate (AUGMENTIN) 875-125 MG tablet     1 tablet Oral Every 12 hours 02/02/17 1550 02/16/17 2359   02/02/17 0000  doxycycline (VIBRA-TABS) 100 MG tablet     100 mg Oral Every 12 hours 02/02/17 1550 02/16/17 2359   01/25/17 2000  DAPTOmycin (CUBICIN) 460 mg in sodium chloride 0.9 % IVPB  Status:  Discontinued     460 mg 218.4 mL/hr over 30 Minutes Intravenous Every 24 hours 01/24/17 2202 01/25/17 1007   01/25/17 2000  DAPTOmycin (CUBICIN) 500 mg  in sodium chloride 0.9 % IVPB  Status:  Discontinued     500 mg 220 mL/hr over 30 Minutes Intravenous Every 24 hours 01/25/17 1007 02/02/17 1053   01/25/17 0600  Ampicillin-Sulbactam (UNASYN) 3 g in sodium chloride 0.9 % 100 mL IVPB  Status:  Discontinued     3 g 200 mL/hr over 30 Minutes Intravenous Every 6 hours 01/25/17 0437 02/02/17 1053   01/24/17 2030  DAPTOmycin (CUBICIN) 460 mg in sodium chloride 0.9 % IVPB     460 mg 218.4 mL/hr over 30 Minutes Intravenous STAT 01/24/17 2016 01/24/17 2130   01/24/17 2015  Ampicillin-Sulbactam (UNASYN) 3 g in sodium chloride 0.9 % 100 mL IVPB     3 g 200 mL/hr over 30 Minutes Intravenous STAT  01/24/17 2014 01/24/17 2110     Assessment/Plan Hypokalemia/hyponatremia- resolved Paraplegia wheelchair-bound-spina bifida. Suprapubic catheter Chronic ulcer on the perineum Hypertension- lopressor PRN  Sepsis/Sirs - afebrile, WBC trending down, 10.9 on 01/11 Ischio rectal abscess, stage IV decub -S/P incision and drainage of complex ischio rectal abscess 01/24/17, Dr. Almond Lint. - undergoing hydrotherapy and BID wet to dry dressing changes  EAV:WUJWJXB diet JY:NWGNFA2/13>>0/86VHQIONGEXB 01/05>>1/14 doxycycline and Augmentin started on 1/14- x 2 weeks per Dr. Feliz Beam recommendation MULTIPLE ORGANISMS PRESENT, NONE PREDOMINANT BACTEROIDES FRAGILIS and BETA LACTAMASE POSITIVENo growth from the blood cultures and insignificant growth from the urine culture. MWU:XLKGMWN Foley:suprapubic catheter   Plan:  Will review with Dr. Derrell Lolling and aim for discharge later today.  I have ordered a wheelchair and new cushion for him also.      LOS: 10 days    Anthony [name] 02/03/2017 [phone]

## 2017-02-03 NOTE — Progress Notes (Signed)
RN called [facility] to give a report at 1500.

## 2017-02-03 NOTE — Discharge Summary (Signed)
Physician Discharge Summary  Patient ID: Anthony [name] MRN: [id number] DOB/AGE: 48/06/1970 48 y.o.  Admit date: 01/24/2017 Discharge date: 02/03/2017   Admission Diagnoses:  Sepsis/Sirs Ischio rectal abscess, stage IV decub Paraplegia wheelchair-bound - spina bifida. Chronic indwelling Foley catheter Wheelchair-bound Chronic ulcer on the perineum Hypertension   Discharge Diagnoses:  Sepsis/Sirs Ischio rectal abscess, stage IV decub Paraplegia wheelchair-bound - spina bifida. Chronic indwelling Foley catheter Wheelchair-bound Chronic ulcer on the perineum Hypertension Hyponatremia - resolved   Principal Problem:   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/24/2017 Active Problems:   Essential hypertension   Pressure ulcer   Spina bifida aperta of lumbar spine (HCC)   Status post debridement   Pressure injury of skin   Paraplegia (HCC)   PROCEDURES:  Incision and drainage of complex ischio rectal abscess 01/24/17, Dr. Faera Byerly Bedside debridement of the decubitus 01/26/17, Dr. P Toth III   Hospital Course:  Pt is a 47 yo M who presents with a 1-2 week history of drainage from his perirectal regions.  He has spina bifida aperta with paraplegia and chronic indwelling suprapubic catheter.  He started having fevers and chills around a week ago.  He was seen in urology yesterday and CT showed bilateral gas and fluid, R >> L.  He was advised to go to the ED yesterday, but it was later in the afternoon and he wanted to wait.  He is quite mobile in the wheelchair despite his paraplegia.  He has some pain, but not a lot in this area.   He is quite hypokalemic in the ED.  He is receiving IV K. Patient was seen and admitted by Dr. Faera Byerly.  He was taken to the operating room that day and underwent incision and drainage of a complex ischio rectal abscess.  He tolerated this well.  Dressing changes were started 24 hours later.  He was found to have a stage IV decubitus ulcer which was  debrided bedside by Dr. Toth on 01/26/17.  He was started on hydrotherapy.  Antibiotic therapy included Unasyn and daptomycin.  He showed good improvement and this was converted to oral Augmentin and doxycycline, Dr. Snider's recommendation with a recommendation for 2 more weeks of therapy.  His wound was cleaning up slowly with hydrotherapy. Patient previously been living independently But has now reached a point where he needs assistance at skilled nursing facility was thought to be his only option.  Case manager and social worker were both asked to see and assist with discharge planning.  Currently the wounds are showing good improvement he still has some necrosis of the upper bedside debridement decubitus.  This is contiguous with the I&D done by Dr. Byerly.  Penrose drains are still in place that Dr. Byerly placed on 01/24/17.  He will need ongoing hydrotherapy and wet-to-dry dressings to the 2 open sites on his thigh. The skilled nursing facility we anticipate discharge to is able to do hydrotherapy.  Follow-up has been arranged with Dr. Byerly on 02/12/17.  He also continue care of his primary care physician. He has an indwelling foley that will need to be changed and he does it at home the first of each month.  He reports the last change was 01/20/17.  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 Theodore Manny.  It was his opinion that he had a stable up-to-date on his SPT change in   labs imaging and surveillance.  He wanted to continue the monthly changes of his Foley.  Ischio rectal abscess was healing well by exam and agreed with wound care at this point.  He noted that the urethral bulb diverticulum with a fistula is stable he has some lower urethral patency and ask as a pop-off to prevent obstructive uropathy in case his suprapubic catheter becomes clogged.  No intervention was  required and was his opinion he could be discharged to rehab.  He agreed with continued monthly changes to his suprapubic catheter which he can assist with.  CBC Latest Ref Rng & Units 01/30/2017 01/28/2017 01/26/2017  WBC 4.0 - 10.5 K/uL 10.9(H) 11.4(H) 12.3(H)  Hemoglobin 13.0 - 17.0 g/dL 10.1(L) 9.8(L) 8.7(L)  Hematocrit 39.0 - 52.0 % 31.3(L) 30.6(L) 25.6(L)  Platelets 150 - 400 K/uL 531(H) 518(H) 408(H)   CMP Latest Ref Rng & Units 01/31/2017 01/30/2017 01/28/2017  Glucose 65 - 99 mg/dL - 91 112(H)  BUN 6 - 20 mg/dL - 10 6  Creatinine 0.61 - 1.24 mg/dL 0.71 0.67 0.68  Sodium 135 - 145 mmol/L - 142 141  Potassium 3.5 - 5.1 mmol/L - 3.9 4.7  Chloride 101 - 111 mmol/L - 110 111  CO2 22 - 32 mmol/L - 26 26  Calcium 8.9 - 10.3 mg/dL - 8.1(L) 8.5(L)  Total Protein 6.5 - 8.1 g/dL - - -  Total Bilirubin 0.3 - 1.2 mg/dL - - -  Alkaline Phos 38 - 126 U/L - - -  AST 15 - 41 U/L - - -  ALT 17 - 63 U/L - - -   CT scan obtained on 01/26/17 showed a decrease in the size of the abscess at the medial aspect of the right buttocks, status post debridement placement of drains.  Generally decreased amount of subcutaneous emphysema throughout the soft tissue of the back with indwelling drain at the level of the coccyx.  There were reactive retroperitoneal and pelvic lymphadenopathy, some neuropathic arthropathy of both hips and small bilateral pleural effusions.  Prior to Admission medications   Medication Sig Start Date End Date Taking? Authorizing Provider  Catheters (FOLEY CATHETER 2-WAY) MISC Use as directed by urologist. 12/30/12  Yes Thekkekandam, Maria T, MD  cephALEXin (KEFLEX) 500 MG capsule Take 500 mg by mouth 4 (four) times daily.   Yes [provider]  lisinopril-hydrochlorothiazide (PRINZIDE,ZESTORETIC) 20-25 MG tablet TAKE 1 TABLET BY MOUTH DAILY. TAKE 1/2 TABLET AT BEDTIME. 11/10/16  Yes Mikell, Asiyah Zahra, MD  Multiple Vitamin (MULTIVITAMIN WITH MINERALS) TABS tablet Take 1 tablet by  mouth daily.   Yes [provider]    Condition on discharge: Improving. CBC Latest Ref Rng & Units 01/30/2017 01/28/2017 01/26/2017  WBC 4.0 - 10.5 K/uL 10.9(H) 11.4(H) 12.3(H)  Hemoglobin 13.0 - 17.0 g/dL 10.1(L) 9.8(L) 8.7(L)  Hematocrit 39.0 - 52.0 % 31.3(L) 30.6(L) 25.6(L)  Platelets 150 - 400 K/uL 531(H) 518(H) 408(H)    CMP Latest Ref Rng & Units 01/31/2017 01/30/2017 01/28/2017  Glucose 65 - 99 mg/dL - 91 112(H)  BUN 6 - 20 mg/dL - 10 6  Creatinine 0.61 - 1.24 mg/dL 0.71 0.67 0.68  Sodium 135 - 145 mmol/L - 142 141  Potassium 3.5 - 5.1 mmol/L - 3.9 4.7  Chloride 101 - 111 mmol/L - 110 111  CO2 22 - 32 mmol/L - 26 26  Calcium 8.9 - 10.3 mg/dL - 8.1(L) 8.5(L)  Total Protein 6.5 - 8.1 g/dL - - -  Total Bilirubin 0.3 - 1.2   mg/dL - - -  Alkaline Phos 38 - 126 U/L - - -  AST 15 - 41 U/L - - -  ALT 17 - 63 U/L - - -  Neurosurgery Disposition: Everyone's is an moan about who is going to take care of this poor woman   Allergies as of 02/03/2017      Reactions   Vancomycin Itching      Medication List    STOP taking these medications   cephALEXin 500 MG capsule Commonly known as:  KEFLEX     TAKE these medications   acetaminophen 325 MG tablet Commonly known as:  TYLENOL Take 2 tablets (650 mg total) by mouth every 6 (six) hours as needed for mild pain, moderate pain, fever or headache (or Fever >/= 101).   amoxicillin-clavulanate 875-125 MG tablet Commonly known as:  AUGMENTIN Take 1 tablet by mouth every 12 (twelve) hours for 14 days.   calcium carbonate 500 MG chewable tablet Commonly known as:  TUMS - dosed in mg elemental calcium Chew 1 tablet (200 mg of elemental calcium total) by mouth 3 (three) times daily with meals as needed for indigestion or heartburn.   collagenase ointment Commonly known as:  SANTYL Apply topically 2 (two) times daily. Apply to decubitus tissue with hydro therapy dressing change.   doxycycline 100 MG tablet Commonly known as:   VIBRA-TABS Take 1 tablet (100 mg total) by mouth every 12 (twelve) hours for 14 days.   Foley Catheter 2-Way Misc Use as directed by urologist.   lisinopril-hydrochlorothiazide 20-25 MG tablet Commonly known as:  PRINZIDE,ZESTORETIC TAKE 1 TABLET BY MOUTH DAILY. TAKE 1/2 TABLET AT BEDTIME.   multivitamin with minerals Tabs tablet Take 1 tablet by mouth daily.   oxyCODONE 5 MG immediate release tablet Commonly known as:  Oxy IR/ROXICODONE Take 1-2 tablets (5-10 mg total) by mouth every 4 (four) hours as needed for moderate pain.   protein supplement shake Liqd Commonly known as:  PREMIER PROTEIN Take 325 mLs (11 oz total) by mouth daily.   senna 8.6 MG Tabs tablet Commonly known as:  SENOKOT Take 1 tablet (8.6 mg total) by mouth 2 (two) times daily.            Durable Medical Equipment  (From admission, onward)        Start     Ordered   02/03/17 1055  For home use only DME wheelchair cushion (seat and back)  Once     02/03/17 1057     Follow-up Information    Byerly, Faera, MD. Go on 02/12/2017.   Specialty:  General Surgery Why:  Your appointment is 02/12/2017 at 4:15PM. Please arrive 30 minutes prior to your appointment to check in and fill out paperwork. Bring photo ID and insurance information. Contact information: [address] [place] [address] [phone]        Mikell, Asiyah Zahra, MD. Call in 2 week(s).   Specialty:  Family Medicine Why:  Call to make a follow up appointment with your primary care physician Contact information: [address] [place]  [address] [phone]           Signed: Meryl Hubers 02/03/2017, 1:40 PM     02/03/17 1057          Follow-up Information     Almond Lint, MD. Go on 02/12/2017.   Specialty:  General Surgery Why:  Your appointment is 02/12/2017 at 4:15PM. Please arrive 30 minutes prior to your appointment to check in and fill out paperwork. Bring photo ID and insurance information. Contact information: [address] [place] [address] [phone]            Berton Bon, MD. Call in 2 week(s).   Specialty:  Family Medicine Why:  Call to make a follow up appointment with your primary care physician Contact information: [address] [place] [address] [phone]                 Signed: Sherrie George 02/03/2017, 1:40 PM

## 2017-02-03 NOTE — Clinical Social Work Placement (Signed)
   2:49 PM Patient chose bed at [facility].  Patient will transport by [facility].  LCSW confirmed bed with facility.   LCSW sent dc docs to facility.   RN report#: [id number]  [facility]  CLINICAL SOCIAL WORK PLACEMENT  NOTE  Date:  02/03/2017  Patient Details  Name: Anthony [name] MRN: [id number] Date of Birth: 1971/01/06  Clinical Social Work is seeking post-discharge placement for this patient at the Skilled  Nursing Facility level of care (*[name] will initial, date and re-position this form in  chart as items are completed):  Yes   Patient/family provided with [facility] Clinical Social Work Department's list of facilities offering this level of care within the geographic area requested by the patient (or if unable, by the patient's family).  Yes   Patient/family informed of their freedom to choose among providers that offer the needed level of care, that participate in Medicare, Medicaid or managed care program needed by the patient, have an available bed and are willing to accept the patient.  Yes   Patient/family informed of [facility]'s ownership interest in [facility] and [facility], as well as of the fact that they are under no obligation to receive care at these facilities.  PASRR submitted to EDS on       PASRR number received on 01/30/17     Existing PASRR number confirmed on       FL2 transmitted to all facilities in geographic area requested by pt/family on 01/30/17     FL2 transmitted to all facilities within larger geographic area on       Patient informed that his/her managed care company has contracts with or will negotiate with certain facilities, including the following:        Yes   Patient/family informed of bed offers received.  [facility]     Physician recommends and patient [facility]    [facility]  02/03/17.  Patient to be transferred to facility by EMS     Patient family notified on   of transfer.  Name of family member notified:  Patient notified family     PHYSICIAN       Additional Comment:    _______________________________________________ Coralyn HellingBernette Cotina Freedman, LCSW 02/03/2017, 2:49 PM

## 2017-02-03 NOTE — Progress Notes (Signed)
Physical Therapy Hydrotherapy Treatment     02/03/17 1236  Subjective Assessment  Subjective Pt reports penrose drain was removed with packing from dressing change last night. Pt also reports possible d/c to SNF today.  Prior Treatments S/P incision and drainage of complex ischio rectal abscess 01/24/17  Evaluation and Treatment  Evaluation and Treatment Procedures Explained to Patient/Family Yes  Evaluation and Treatment Procedures agreed to  Pressure Injury 01/28/17 Unstageable - Full thickness tissue loss in which the base of the ulcer is covered by slough (yellow, tan, gray, green or [place]) and/or eschar (tan, Mariotti or black) in the wound bed. HYDRO  Date First Assessed/Time First Assessed: 01/28/17 1150   Location: Buttocks  Location Orientation: Right;Proximal  Staging: Unstageable - Full thickness tissue loss in which the base of the ulcer is covered by slough (yellow, tan, gray, green or [place]...  Dressing Type ABD;Gauze (Comment);Moist to dry;Paper tape (kerlix, santyl)  Dressing Changed  Dressing Change Frequency PRN  State of Healing Eschar  Site / Wound Assessment Red;Pink;Yellow  % Wound base Red or Granulating 60%  % Wound base Yellow/Fibrinous Exudate 40%  Peri-wound Assessment Intact;Erythema (non-blanchable)  Tunneling (cm) (see evaluation note, packing to tunnel performed)  Margins Unattached edges (unapproximated)  Drainage Amount Moderate  Drainage Description Serosanguineous;Odor;Green  Treatment Debridement (Selective);Hydrotherapy (Pulse lavage);Packing (Saline gauze)  Hydrotherapy  Pulsed lavage therapy - wound location proximal R buttocks  Pulsed Lavage with Suction (psi) 8 psi  Pulsed Lavage with Suction - Normal Saline Used 1000 mL  Pulsed Lavage Tip Tip with splash shield  Selective Debridement  Selective Debridement - Location proximal R buttocks  Selective Debridement - Tools Used Scissors;Forceps  Selective Debridement - Tissue Removed necrotic tissue   Wound Therapy - Assess/Plan/Recommendations  Wound Therapy - Clinical Statement Pt would benefit from hydrotherapy to facilitate wound healing by removing necrotic tissue and decreasing bioburden.  Wound Therapy - Functional Problem List hx spina bifida, w/c bound, no sensation or motor function of LEs  Factors Delaying/Impairing Wound Healing Immobility;Incontinence  Hydrotherapy Plan Dressing change;Debridement;Patient/family education;Pulsatile lavage with suction  Wound Therapy - Frequency 6X / week  Wound Therapy - Current Recommendations (WOC and surgery involved)  Wound Therapy - Follow Up Recommendations Skilled nursing facility  Wound Plan assist with wound healing of proximal wound as outlined above  Wound Therapy Goals - Improve the function of patient's integumentary system by progressing the wound(s) through the phases of wound healing by:  Decrease Necrotic Tissue to 20  Decrease Necrotic Tissue - Progress Progressing toward goal  Increase Granulation Tissue to 80  Increase Granulation Tissue - Progress Progressing toward goal  Improve Drainage Characteristics Min  Improve Drainage Characteristics - Progress Progressing toward goal  Time For Goal Achievement 2 weeks  Wound Therapy - Potential for Goals Fair   Zenovia JarredKati Javonnie Illescas, PT, DPT 02/03/2017 Pager: [phone]

## 2017-02-03 NOTE — Consult Note (Signed)
Reason for Consult: Neurogenic Bladder, Ischiorectal Abscess  Referring Physician: Almond LintFaera Byerly MD  Anthony [name] is an 47 y.o. male.   HPI:   1 - Neurogenic Bladder - spina bifida with neurogenic bladder managed with SPT x years changed Q monthly by the patient himselt. Last changed 01/21/17.  Imating 01/2017 w/o stones / hydro. Cr 1<1.  2 -  Ischiorectal Abscess - s/p operative I+D abscess 01/25/17 by Donell BeersByerly. CX bacteroides. Converted to wet to dry wound.   3 - Urethral Bulb Diverticulum with Small Cutaneous Fistula - years of occasional uriniferous discharge from mucosalized old perineal wound during strong bladder spasm or when SPT gets clogged.   Today "Anthony [name]" is seen in consultation for above.   Past Medical History:  Diagnosis Date  . Chronic indwelling Foley catheter   . Spina bifida   . Wheelchair bound     Past Surgical History:  Procedure Laterality Date  . BACK SURGERY    . HIP SURGERY    . INCISION AND DRAINAGE OF WOUND N/A 01/24/2017   Procedure: IRRIGATION AND DEBRIDEMENT WOUND- BUTTOCK ABSCESS;  Surgeon: Almond LintByerly, Faera, MD;  Location: [facility];  Service: General;  Laterality: N/A;    No family history on file.  Social History:  reports that  has never smoked. he has never used smokeless tobacco. He reports that he does not drink alcohol. His drug history is not on file.  Allergies:  Allergies  Allergen Reactions  . Vancomycin Itching    Medications: I have reviewed the patient's current medications.  No results found for this or any previous visit (from the past 48 hour(s)).  No results found.  Review of Systems  Constitutional: Negative.  Negative for fever and weight loss.  HENT: Negative.   Eyes: Negative.   Respiratory: Negative.   Cardiovascular: Negative.   Gastrointestinal: Negative.  Negative for vomiting.  Genitourinary: Negative for flank pain and hematuria.  Musculoskeletal: Negative.   Skin: Negative.   Neurological: Positive for focal  weakness.  Endo/Heme/Allergies: Negative.   Psychiatric/Behavioral: Negative.    Blood pressure 119/79, pulse 68, temperature 98.3 F (36.8 C), temperature source Oral, resp. rate 18, height 5\' 8"  (1.727 m), weight 82 kg (180 lb 12.4 oz), SpO2 98 %. Physical Exam  Constitutional:  Stigmata of spina bifida, chronic.   HENT:  Head: Normocephalic.  Poor dentition.   Eyes: Pupils are equal, round, and reactive to light.  Neck: Normal range of motion.  Cardiovascular: Normal rate.  Respiratory: Effort normal.  GI: Soft.  Wet to dry wound posterior to recutm is clean with granulation tissue at base.   Genitourinary:  Genitourinary Comments: SPT in place without site erosion and clear urien with very scant debris. Perinal area of small urethral fistula dry and well mucosalized. No erosion.   Musculoskeletal:  LE wassting and hip scars.   Neurological: He is alert.  Skin: Skin is warm.  Psychiatric: He has a normal mood and affect.    Assessment/Plan:  1 - Neurogenic Bladder - stable and up to date on SPT change and lab / imaging surveillance. Continue SPT chagnes Q monthly per patient.   2 -  Ischiorectal Abscess - healing well by exam, labs. Agree with local wound care only at this point.    3 - Urethral Bulb Diverticulum with Small Cutaneous Fistula - stable. THis is acutally good that he has some lower urethral patency acting as "pop off" to prevent obstructive uropathy should his SPT become clogged. No intervention required.  PT ok for DC to rehab from GU perspective. He will need continued monthly SPT changes there which he can help with.   Anthony [name] 02/03/2017, 12:45 PM

## 2017-02-04 ENCOUNTER — Encounter: Payer: Self-pay | Admitting: Adult Health

## 2017-02-04 ENCOUNTER — Non-Acute Institutional Stay (SKILLED_NURSING_FACILITY): Payer: Medicare Other | Admitting: Adult Health

## 2017-02-04 DIAGNOSIS — Q057 Lumbar spina bifida without hydrocephalus: Secondary | ICD-10-CM | POA: Diagnosis not present

## 2017-02-04 DIAGNOSIS — Z9359 Other cystostomy status: Secondary | ICD-10-CM | POA: Diagnosis not present

## 2017-02-04 DIAGNOSIS — G8220 Paraplegia, unspecified: Secondary | ICD-10-CM

## 2017-02-04 DIAGNOSIS — K5901 Slow transit constipation: Secondary | ICD-10-CM | POA: Diagnosis not present

## 2017-02-04 DIAGNOSIS — K6139 Other ischiorectal abscess: Secondary | ICD-10-CM

## 2017-02-04 DIAGNOSIS — I10 Essential (primary) hypertension: Secondary | ICD-10-CM

## 2017-02-04 DIAGNOSIS — N319 Neuromuscular dysfunction of bladder, unspecified: Secondary | ICD-10-CM

## 2017-02-04 NOTE — Progress Notes (Signed)
Location:   [facility] Room Number: 122 B Place of Service:  SNF (31)   CODE STATUS: Full Code  Allergies  Allergen Reactions  . Vancomycin Itching    Chief Complaint  Patient presents with  . Hospitalization Follow-up    Hopsital follow up    HPI:  He is a 46 year who has a history of spina bifida wit paraplegia; chronic suprapubic foley. He had drainage from is perirectal area for several days prior to his hospitalization. Went on day of admission and udnersent I/D of a complex ischio rectal abscess. Dressing changes were started and he underwent a bed side debridement on 01-26-17 and was started on pulse lavage. He will need 2 more weeks of oral abt by Dr. Drue SecondSnider recommendations. He is here for wound management and abt.  He denies pain; no changes in appetite; no reports of fever present; on changes in bowel habits. He does not have pain in his wounds. He will be monitored for his chronic illnesses including: hypertension; spina bifida and pressure ulcers. There are no nursing concerns at this time.    Past Medical History:  Diagnosis Date  . Chronic indwelling Foley catheter   . Spina bifida   . Wheelchair bound     Past Surgical History:  Procedure Laterality Date  . BACK SURGERY    . HIP SURGERY    . INCISION AND DRAINAGE OF WOUND N/A 01/24/2017   Procedure: IRRIGATION AND DEBRIDEMENT WOUND- BUTTOCK ABSCESS;  Surgeon: Almond LintByerly, Faera, MD;  Location: [facility];  Service: General;  Laterality: N/A;    Social History   Socioeconomic History  . Marital status: Single    Spouse name: Not on file  . Number of children: Not on file  . Years of education: Not on file  . Highest education level: Not on file  Social Needs  . Financial resource strain: Not on file  . Food insecurity - worry: Not on file  . Food insecurity - inability: Not on file  . Transportation needs - medical: Not on file  . Transportation needs - non-medical: Not on file  Occupational History    . Not on file  Tobacco Use  . Smoking status: Never Smoker  . Smokeless tobacco: Never Used  Substance and Sexual Activity  . Alcohol use: No    Frequency: Never  . Drug use: Not on file  . Sexual activity: Not on file  Other Topics Concern  . Not on file  Social History Narrative  . Not on file   History reviewed. No pertinent family history.    VITAL SIGNS BP 117/78   Pulse 66   Temp 98 F (36.7 C)   Resp 16   Ht 5\' 8"  (1.727 m)   Wt 174 lb 8 oz (79.2 kg)   SpO2 96%   BMI 26.53 kg/m   Outpatient Encounter Medications as of 02/04/2017  Medication Sig  . acetaminophen (TYLENOL) 325 MG tablet Take 2 tablets (650 mg total) by mouth every 6 (six) hours as needed for mild pain, moderate pain, fever or headache (or Fever >/= 101).  Marland Kitchen. amoxicillin-clavulanate (AUGMENTIN) 875-125 MG tablet Take 1 tablet by mouth every 12 (twelve) hours for 14 days.  . calcium carbonate (TUMS - DOSED IN MG ELEMENTAL CALCIUM) 500 MG chewable tablet Chew 1 tablet (200 mg of elemental calcium total) by mouth 3 (three) times daily with meals as needed for indigestion or heartburn.  . Catheters (FOLEY CATHETER 2-WAY) MISC Use as  directed by urologist.  . collagenase (SANTYL) ointment Apply topically 2 (two) times daily. Apply to decubitus tissue with hydro therapy dressing change.  Marland Kitchen doxycycline (VIBRA-TABS) 100 MG tablet Take 1 tablet (100 mg total) by mouth every 12 (twelve) hours for 14 days.  Marland Kitchen lisinopril-hydrochlorothiazide (PRINZIDE,ZESTORETIC) 20-25 MG tablet TAKE 1 TABLET BY MOUTH DAILY. TAKE 1/2 TABLET AT BEDTIME.  . Multiple Vitamin (MULTIVITAMIN WITH MINERALS) TABS tablet Take 1 tablet by mouth daily.  . Nutritional Supplements (NUTRITIONAL SUPPLEMENT PO) House 2.0 - Med [facility] - Give 240cc by mouth two times daily between meals for wound healing  . oxyCODONE (OXY IR/ROXICODONE) 5 MG immediate release tablet Take 1-2 tablets (5-10 mg total) by mouth every 4 (four) hours as needed for moderate  pain.  Marland Kitchen senna (SENOKOT) 8.6 MG TABS tablet Take 1 tablet (8.6 mg total) by mouth 2 (two) times daily.  . [DISCONTINUED] protein supplement shake (PREMIER PROTEIN) LIQD Take 325 mLs (11 oz total) by mouth daily. (Patient not taking: Reported on 02/04/2017)   No facility-administered encounter medications on file as of 02/04/2017.      SIGNIFICANT DIAGNOSTIC EXAMS  TODAY:   01-26-17: ct of abdomen and pelvis:  1. Decreased size of abscess cavity at the medial aspect of the right buttock, status post debridement and placement of drain. Generally decreased amount of subcutaneous emphysema throughout the soft tissues of the back with indwelling drain at the level of the coccyx. 2. Reactive retroperitoneal and pelvic lymphadenopathy. 3. Neuropathic arthropathy of both hips. 4. Small bilateral pleural effusions.  LABS REVIEWED: TODAY:   01-24-17: wbc 21.9; hgb 11.1; hct 31.4; mcv 80.9; plt 378; glucose 126; bun 15; creat 0.89; k+ 2.1; na++ 129; ca 8.1; liver normal albumin 2.5; mag 1.7; blood culture no growth; urine culture: <10,000 colonies 01-30-17: wbc 10.9; hgb 10.1; hct 31.3; mcv 86.5; plt 531; glucose 91; bun 10; creat 0.67; k+ 3.9; na++ 142; ca 8.1   Review of Systems  Constitutional: Negative for malaise/fatigue.  Respiratory: Negative for cough and shortness of breath.   Cardiovascular: Negative for chest pain, palpitations and leg swelling.  Gastrointestinal: Negative for abdominal pain, constipation and heartburn.  Genitourinary:       S/p foley   Musculoskeletal: Negative for back pain, joint pain and myalgias.       Paraplegia   Skin:       Has sores  Neurological: Negative for dizziness.  Psychiatric/Behavioral: The patient is not nervous/anxious.     Physical Exam  Constitutional: He is oriented to person, place, and time. He appears well-developed and well-nourished. No distress.  Neck: No thyromegaly present.  Cardiovascular: Normal rate, regular rhythm, normal heart  sounds and intact distal pulses.  Pedal pulses are faint   Pulmonary/Chest: Effort normal and breath sounds normal. No respiratory distress.  Abdominal: Soft. Bowel sounds are normal. He exhibits no distension. There is no tenderness.  Genitourinary:  Genitourinary Comments: Supra pubic foley   Musculoskeletal: He exhibits no edema.  Paraplegia  Is status post right toes amputation 2-5th toes.   Lymphadenopathy:    He has no cervical adenopathy.  Neurological: He is alert and oriented to person, place, and time.  Skin: Skin is warm and dry. He is not diaphoretic.  Dressing to ischiorectal abscess intact .   Psychiatric: He has a normal mood and affect.      ASSESSMENT/ PLAN:  TODAY:   1. Essential hypertension: stable b/p 117/78 will continue lisinopril hct 20/25 mg one in the Am and  1/2 in the Pm   2. Spina bifida aperta of lumbar spine with paraplegia: without change in status  3. Neurogenic bladder: stable has s/p foley:   4. Slow transit constipation: stable will continue colace twice daily and senna twice daily   5. Ischiorectal abscess s/p I/D; will continue wound care as directed; will continue augementin 875 mg twice daily for 14 days and doxycycline 100 mg twice daily for 14 days and will continue oxycodone 5 or 10 mg every 4 hours as needed for pain     MD is aware of resident's narcotic use and is in agreement with current plan of care. We will attempt to wean resident as apropriate   Synthia Innocent NP [facility]  Contact [phone] Monday through Friday 8am- 5pm  After hours call [phone]

## 2017-02-05 ENCOUNTER — Ambulatory Visit: Payer: Medicare Other | Admitting: Internal Medicine

## 2017-02-05 ENCOUNTER — Non-Acute Institutional Stay (SKILLED_NURSING_FACILITY): Payer: Medicare Other | Admitting: Internal Medicine

## 2017-02-05 ENCOUNTER — Encounter: Payer: Self-pay | Admitting: Internal Medicine

## 2017-02-05 DIAGNOSIS — Q057 Lumbar spina bifida without hydrocephalus: Secondary | ICD-10-CM

## 2017-02-05 DIAGNOSIS — K6139 Other ischiorectal abscess: Secondary | ICD-10-CM | POA: Diagnosis not present

## 2017-02-05 DIAGNOSIS — I10 Essential (primary) hypertension: Secondary | ICD-10-CM | POA: Diagnosis not present

## 2017-02-05 DIAGNOSIS — G8220 Paraplegia, unspecified: Secondary | ICD-10-CM | POA: Diagnosis not present

## 2017-02-05 NOTE — Progress Notes (Deleted)
Patient ID: [name], male   DOB: Jun 20, 1970, 47 y.o.   MRN: [id number]   Provider:  DR Elmon Kirschner Location:  [facility] Room Number: 122 B Place of Service:  SNF (31)  PCP: Berton Bon, MD Patient Care Team: Berton Bon, MD as PCP - General (Family Medicine)  Extended Emergency Contact Information Primary Emergency Contact: CRANDALL, HARVEL,  Home Phone: [phone] Relation: None  Code Status: Full Code Goals of Care: Advanced Directive information Advanced Directives 02/05/2017  Does Patient Have a Medical Advance Directive? No  Would patient like information on creating a medical advance directive? No - Patient declined      Chief Complaint  Patient presents with  . New Admit To SNF    Admission    HPI: Patient is a 47 y.o. male seen today for admission to  Past Medical History:  Diagnosis Date  . Chronic indwelling Foley catheter   . Spina bifida   . Wheelchair bound    Past Surgical History:  Procedure Laterality Date  . BACK SURGERY    . HIP SURGERY    . INCISION AND DRAINAGE OF WOUND N/A 01/24/2017   Procedure: IRRIGATION AND DEBRIDEMENT WOUND- BUTTOCK ABSCESS;  Surgeon: Almond Lint, MD;  Location: [facility];  Service: General;  Laterality: N/A;    reports that  has never smoked. he has never used smokeless tobacco. He reports that he does not drink alcohol. His drug history is not on file. Social History   Socioeconomic History  . Marital status: Single    Spouse name: Not on file  . Number of children: Not on file  . Years of education: Not on file  . Highest education level: Not on file  Social Needs  . Financial resource strain: Not on file  . Food insecurity - worry: Not on file  . Food insecurity - inability: Not on file  . Transportation needs - medical: Not on file  . Transportation needs - non-medical: Not on file  Occupational History  . Not on file  Tobacco Use  .  Smoking status: Never Smoker  . Smokeless tobacco: Never Used  Substance and Sexual Activity  . Alcohol use: No    Frequency: Never  . Drug use: Not on file  . Sexual activity: Not on file  Other Topics Concern  . Not on file  Social History Narrative  . Not on file    Functional Status Survey:    History reviewed. No pertinent family history.  Health Maintenance  Topic Date Due  . INFLUENZA VACCINE  10/07/2017 (Originally 08/20/2016)  . HIV Screening  02/04/2018 (Originally 11/04/1984)  . TETANUS/TDAP  03/26/2022    Allergies  Allergen Reactions  . Vancomycin Itching    Outpatient Encounter Medications as of 02/05/2017  Medication Sig  . acetaminophen (TYLENOL) 325 MG tablet Take 2 tablets (650 mg total) by mouth every 6 (six) hours as needed for mild pain, moderate pain, fever or headache (or Fever >/= 101).  Marland Kitchen amoxicillin-clavulanate (AUGMENTIN) 875-125 MG tablet Take 1 tablet by mouth every 12 (twelve) hours for 14 days.  . calcium carbonate (TUMS - DOSED IN MG ELEMENTAL CALCIUM) 500 MG chewable tablet Give 1 tablet by mouth with meals as needed for indigestion / heartburn  . doxycycline (VIBRA-TABS) 100 MG tablet Take 1 tablet (100 mg total) by mouth every 12 (twelve) hours for 14 days.  Marland Kitchen lisinopril-hydrochlorothiazide (PRINZIDE,ZESTORETIC)  20-25 MG tablet TAKE 1 TABLET BY MOUTH DAILY. TAKE 1/2 TABLET AT BEDTIME.  . Multiple Vitamin (MULTIVITAMIN WITH MINERALS) TABS tablet Take 1 tablet by mouth daily.  . Nutritional Supplements (NUTRITIONAL SUPPLEMENT PO) House 2.0 - [facility] - Give 240cc by mouth two times daily between meals for wound healing  . oxyCODONE (OXY IR/ROXICODONE) 5 MG immediate release tablet Take 1-2 tablets (5-10 mg total) by mouth every 4 (four) hours as needed for moderate pain.  Marland Kitchen. senna (SENOKOT) 8.6 MG TABS tablet Take 1 tablet (8.6 mg total) by mouth 2 (two) times daily.  . Catheters (FOLEY CATHETER 2-WAY) MISC Use as directed by urologist.  .  collagenase (SANTYL) ointment Apply topically 2 (two) times daily. Apply to decubitus tissue with hydro therapy dressing change.  . [DISCONTINUED] calcium carbonate (TUMS - DOSED IN MG ELEMENTAL CALCIUM) 500 MG chewable tablet Chew 1 tablet (200 mg of elemental calcium total) by mouth 3 (three) times daily with meals as needed for indigestion or heartburn. (Patient not taking: Reported on 02/05/2017)   No facility-administered encounter medications on file as of 02/05/2017.     Review of Systems  Vitals:   02/05/17 0908  BP: 117/78  Pulse: 66  Resp: 16  Temp: 98 F (36.7 C)  SpO2: 96%  Weight: 174 lb 8 oz (79.2 kg)  Height: 5\' 8"  (1.727 m)   Body mass index is 26.53 kg/m. Physical Exam  Labs reviewed: Basic Metabolic Panel: Recent Labs    01/24/17 1629  01/26/17 0624 01/28/17 0608 01/30/17 0558 01/31/17 0543  NA 129*   < > 140 141 142  --   K 2.1*   < > 3.8 4.7 3.9  --   CL 93*   < > 112* 111 110  --   CO2 24   < > 23 26 26   --   GLUCOSE 126*   < > 135* 112* 91  --   BUN 15   < > 6 6 10   --   CREATININE 0.89   < > 0.62 0.68 0.67 0.71  CALCIUM 8.1*   < > 7.7* 8.5* 8.1*  --   MG 1.7  --   --   --   --   --    < > = values in this interval not displayed.   Liver Function Tests: Recent Labs    01/24/17 1629  AST 21  ALT 21  ALKPHOS 69  BILITOT 0.6  PROT 7.0  ALBUMIN 2.5*   No results for input(s): LIPASE, AMYLASE in the last 8760 hours. No results for input(s): AMMONIA in the last 8760 hours. CBC: Recent Labs    01/24/17 1629  01/26/17 0624 01/28/17 0608 01/30/17 0558  WBC 21.9*   < > 12.3* 11.4* 10.9*  NEUTROABS 19.1*  --   --   --   --   HGB 11.1*   < > 8.7* 9.8* 10.1*  HCT 31.4*   < > 25.6* 30.6* 31.3*  MCV 80.9   < > 83.4 86.7 86.5  PLT 378   < > 408* 518* 531*   < > = values in this interval not displayed.   Cardiac Enzymes: Recent Labs    01/25/17 0610 02/01/17 0631  CKTOTAL 35* 19*   BNP: Invalid input(s): POCBNP No results found for:  HGBA1C Lab Results  Component Value Date   TSH 0.704 06/01/2007   No results found for: VITAMINB12 No results found for: FOLATE No results found for: IRON, TIBC,  FERRITIN  Imaging and Procedures obtained prior to SNF admission: No results found.  Assessment/Plan    Family/ staff Communication:   Labs/tests ordered:    Bertis Ruddy. Ancil Linsey  [facility] [address] [place], [place] [address] [phone] Cell (Monday-Friday 8 AM - 5 PM) [phone] After 5 PM and follow prompts

## 2017-02-05 NOTE — Progress Notes (Signed)
Patient ID: Anthony [name], male   DOB: [birth year], 47 y.o.   MRN: [id number]  Provider:  DR Elmon KirschnerMONICA S Naiyana Barbian Location:  [facility] Room Number: 122 B Place of Service:  SNF (31)  PCP: Berton BonMikell, Asiyah Zahra, MD Patient Care Team: Berton BonMikell, Asiyah Zahra, MD as PCP - General (Family Medicine)  Extended Emergency Contact Information Primary Emergency Contact: Astrid DivineBROWN,MARIE          [place],  Home Phone: [phone] Relation: None  Code Status: FULL CODE Goals of Care: Advanced Directive information Advanced Directives 02/05/2017  Does Patient Have a Medical Advance Directive? No  Would patient like information on creating a medical advance directive? No - Patient declined      Chief Complaint  Patient presents with  . New Admit To SNF    Admission    HPI: Patient is a 47 y.o. male seen today for admission to SNF following hospital stay for ischiorectal abscess, paraplegia, spina bifida w/c bound, HTN, chronic ulcer on perineum, hyponatremia. He presented to the ED with fever,  Tachycardia, and CT revealing b/l gas and fluid R>L buttock area. He underwent complex I&D of abscess on 01/24/17 by Dr Donell BeersByerly. Abscess improved but still noted on CT abd/pelvis 01/26/17 --> bedside debridement on 01/26/17 by Dr Hortense RamalP Toth III. Wound care followed. 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 ID consulted and IV abx unasyn and daptomycin given --> po augmentin and doxy (STOP DATE 02/16/17). Drains removed. He was started on hydrotherapy. Electrolytes repleted. Albumin 2.5; wound cx (+) multiple spp including B fragilis; K 2.1--> 3.9; Na 129 -->142; Mg 1.7; WBC 21.9K with abs neutrophils 19.1K-->10.9K; Hgb 10.1 at d/c. He presents to SNF for short term rehab.  Today he reports no concerns. He lives alone and is independent. He ambulates using standard w/c. No f/c. Appetite is  excellent and sleeps well. No nursing issues. Pain controlled on oxycodone prn  HTN - stable on lisinopril hct 20/25 mg one in the Am and 1/2 in the Pm   Spina bifida aperta of lumbar spine with paraplegia - unchanged  Neurogenic bladder - 2/2 spina bifida; he has a chronic foley cath  Slow transit constipation - stable on colace twice daily and senna twice daily   Past Medical History:  Diagnosis Date  . Chronic indwelling Foley catheter   . Spina bifida   . Wheelchair bound    Past Surgical History:  Procedure Laterality Date  . BACK SURGERY    . HIP SURGERY    . INCISION AND DRAINAGE OF WOUND N/A 01/24/2017   Procedure: IRRIGATION AND DEBRIDEMENT WOUND- BUTTOCK ABSCESS;  Surgeon: Almond LintByerly, Faera, MD;  Location: [facility];  Service: General;  Laterality: N/A;    reports that  has never smoked. he has never used smokeless tobacco. He reports that he does not drink alcohol. His drug history is not on file. Social History   Socioeconomic History  . Marital status: Single    Spouse name: Not on file  . Number of children: Not on file  . Years of education: Not on file  . Highest education level: Not on file  Social Needs  . Financial resource strain: Not on file  . Food insecurity - worry: Not on file  . Food insecurity - inability: Not on file  . Transportation needs - medical: Not on file  . Transportation needs - non-medical: Not on  file  Occupational History  . Not on file  Tobacco Use  . Smoking status: Never Smoker  . Smokeless tobacco: Never Used  Substance and Sexual Activity  . Alcohol use: No    Frequency: Never  . Drug use: Not on file  . Sexual activity: Not on file  Other Topics Concern  . Not on file  Social History Narrative  . Not on file    History reviewed. No pertinent family history. no Fhx DM, HTN, hyperlipidemia  Health Maintenance  Topic Date Due  . INFLUENZA VACCINE  10/07/2017 (Originally 08/20/2016)  . HIV Screening  02/04/2018 (Originally  11/04/1984)  . TETANUS/TDAP  03/26/2022    Allergies  Allergen Reactions  . Vancomycin Itching    Outpatient Encounter Medications as of 02/05/2017  Medication Sig  . acetaminophen (TYLENOL) 325 MG tablet Take 2 tablets (650 mg total) by mouth every 6 (six) hours as needed for mild pain, moderate pain, fever or headache (or Fever >/= 101).  Marland Kitchen amoxicillin-clavulanate (AUGMENTIN) 875-125 MG tablet Take 1 tablet by mouth every 12 (twelve) hours for 14 days.  . calcium carbonate (TUMS - DOSED IN MG ELEMENTAL CALCIUM) 500 MG chewable tablet Give 1 tablet by mouth with meals as needed for indigestion / heartburn  . doxycycline (VIBRA-TABS) 100 MG tablet Take 1 tablet (100 mg total) by mouth every 12 (twelve) hours for 14 days.  Marland Kitchen lisinopril-hydrochlorothiazide (PRINZIDE,ZESTORETIC) 20-25 MG tablet TAKE 1 TABLET BY MOUTH DAILY. TAKE 1/2 TABLET AT BEDTIME.  . Multiple Vitamin (MULTIVITAMIN WITH MINERALS) TABS tablet Take 1 tablet by mouth daily.  . Nutritional Supplements (NUTRITIONAL SUPPLEMENT PO) House 2.0 - Med Pass - Give 240cc by mouth two times daily between meals for wound healing  . oxyCODONE (OXY IR/ROXICODONE) 5 MG immediate release tablet Take 1-2 tablets (5-10 mg total) by mouth every 4 (four) hours as needed for moderate pain.  Marland Kitchen senna (SENOKOT) 8.6 MG TABS tablet Take 1 tablet (8.6 mg total) by mouth 2 (two) times daily.  . Catheters (FOLEY CATHETER 2-WAY) MISC Use as directed by urologist.  . collagenase (SANTYL) ointment Apply topically 2 (two) times daily. Apply to decubitus tissue with hydro therapy dressing change.  . [DISCONTINUED] calcium carbonate (TUMS - DOSED IN MG ELEMENTAL CALCIUM) 500 MG chewable tablet Chew 1 tablet (200 mg of elemental calcium total) by mouth 3 (three) times daily with meals as needed for indigestion or heartburn. (Patient not taking: Reported on 02/05/2017)   No facility-administered encounter medications on file as of 02/05/2017.     Review of Systems    Musculoskeletal: Positive for gait problem.  Skin: Positive for wound.  Neurological: Positive for weakness.  All other systems reviewed and are negative.   Vitals:   02/05/17 0908  BP: 117/78  Pulse: 66  Resp: 16  Temp: 98 F (36.7 C)  SpO2: 96%  Weight: 174 lb 8 oz (79.2 kg)  Height: 5\' 8"  (1.727 m)   Body mass index is 26.53 kg/m. Physical Exam  Constitutional: He is oriented to person, place, and time. He appears well-developed.  Frail appearing in NAD, lying on right side in the bed  HENT:  Mouth/Throat: Oropharynx is clear and moist.  MMM; no oral thrush  Eyes: Pupils are equal, round, and reactive to light. No scleral icterus.  Neck: Neck supple. Carotid bruit is not present. No thyromegaly present.  Cardiovascular: Normal rate, regular rhythm, normal heart sounds and intact distal pulses. Exam reveals no gallop and no friction rub.  No murmur heard. No distal LE edema. No calf TTP  Pulmonary/Chest: Effort normal and breath sounds normal. He has no wheezes. He has no rales. He exhibits no tenderness.  Abdominal: Soft. Normal appearance and bowel sounds are normal. He exhibits no distension, no abdominal bruit, no pulsatile midline mass and no mass. There is no hepatomegaly. There is no tenderness. There is no rigidity, no rebound and no guarding. No hernia.  Obese; suprapubic cath intact with no redness at insertion site  Genitourinary:  Genitourinary Comments: Foley cath intact and DTG clear yellow urine  Musculoskeletal: He exhibits deformity (several toes amputated).  Lymphadenopathy:    He has no cervical adenopathy.  Neurological: He is alert and oriented to person, place, and time.  B/l LE paralysis  Skin: Skin is warm and dry. No rash noted.     Psychiatric: He has a normal mood and affect. His behavior is normal. Judgment and thought content normal.    Labs reviewed: Basic Metabolic Panel: Recent Labs    01/24/17 1629  01/26/17 0624 01/28/17 0608  01/30/17 0558 01/31/17 0543  NA 129*   < > 140 141 142  --   K 2.1*   < > 3.8 4.7 3.9  --   CL 93*   < > 112* 111 110  --   CO2 24   < > 23 26 26   --   GLUCOSE 126*   < > 135* 112* 91  --   BUN 15   < > 6 6 10   --   CREATININE 0.89   < > 0.62 0.68 0.67 0.71  CALCIUM 8.1*   < > 7.7* 8.5* 8.1*  --   MG 1.7  --   --   --   --   --    < > = values in this interval not displayed.   Liver Function Tests: Recent Labs    01/24/17 1629  AST 21  ALT 21  ALKPHOS 69  BILITOT 0.6  PROT 7.0  ALBUMIN 2.5*   No results for input(s): LIPASE, AMYLASE in the last 8760 hours. No results for input(s): AMMONIA in the last 8760 hours. CBC: Recent Labs    01/24/17 1629  01/26/17 0624 01/28/17 0608 01/30/17 0558  WBC 21.9*   < > 12.3* 11.4* 10.9*  NEUTROABS 19.1*  --   --   --   --   HGB 11.1*   < > 8.7* 9.8* 10.1*  HCT 31.4*   < > 25.6* 30.6* 31.3*  MCV 80.9   < > 83.4 86.7 86.5  PLT 378   < > 408* 518* 531*   < > = values in this interval not displayed.   Cardiac Enzymes: Recent Labs    01/25/17 0610 02/01/17 0631  CKTOTAL 35* 19*   BNP: Invalid input(s): POCBNP No results found for: HGBA1C Lab Results  Component Value Date   TSH 0.704 06/01/2007   No results found for: VITAMINB12 No results found for: FOLATE No results found for: IRON, TIBC, FERRITIN  Imaging and Procedures obtained prior to SNF admission: No results found.  Assessment/Plan   ICD-10-CM   1. 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/24/2017 K61.39   2. Paraplegia (HCC) G82.20   3. Spina bifida aperta of lumbar spine (HCC) Q05.7   4. Essential hypertension I10    Cont current meds as ordered. ABX STOP DATE 02/16/17  PT/OT as ordered  Wound care as ordered  F/u with surgery and urology as scheduled  Nutritional  supplements as ordered  Foley cath care as indicated   GOAL: short term rehab and d/c home when medically appropriate. Communicated with pt and nursing.  Labs/tests ordered: none   Anderson Coppock S.  Ancil Linsey  [facility] [address] [place], [place] [address] [phone] Cell (Monday-Friday 8 AM - 5 PM) [phone] After 5 PM and follow prompts

## 2017-02-09 ENCOUNTER — Non-Acute Institutional Stay (SKILLED_NURSING_FACILITY): Payer: Medicare Other | Admitting: Adult Health

## 2017-02-09 ENCOUNTER — Encounter: Payer: Self-pay | Admitting: Internal Medicine

## 2017-02-09 ENCOUNTER — Encounter: Payer: Self-pay | Admitting: Adult Health

## 2017-02-09 DIAGNOSIS — K6139 Other ischiorectal abscess: Secondary | ICD-10-CM | POA: Diagnosis not present

## 2017-02-09 DIAGNOSIS — I10 Essential (primary) hypertension: Secondary | ICD-10-CM

## 2017-02-09 NOTE — Progress Notes (Signed)
Location:   [facility] Room Number: 122 B Place of Service:  SNF (31)   CODE STATUS: Full Code  Allergies  Allergen Reactions  . Vancomycin Itching    Chief Complaint  Patient presents with  . Acute Visit    Care Plan Meeting    HPI:  We have come together with patient; family and care plan team. The goals of his care are for to return home; after he has completed the treatment for his sacral wound. He is presently receiving power spray to his wound bed. He tells me that his blood pressure is for 1/2 tabs twice daily. He would like his colace changed to as needed basis. There are no nursing concerns. He denies any changes in appetite; no fevers; no uncontrolled pain.   Past Medical History:  Diagnosis Date  . Chronic indwelling Foley catheter   . Spina bifida   . Wheelchair bound     Past Surgical History:  Procedure Laterality Date  . BACK SURGERY    . HIP SURGERY    . INCISION AND DRAINAGE OF WOUND N/A 01/24/2017   Procedure: IRRIGATION AND DEBRIDEMENT WOUND- BUTTOCK ABSCESS;  Surgeon: Almond Lint, MD;  Location: [facility];  Service: General;  Laterality: N/A;    Social History   Socioeconomic History  . Marital status: Single    Spouse name: Not on file  . Number of children: Not on file  . Years of education: Not on file  . Highest education level: Not on file  Social Needs  . Financial resource strain: Not on file  . Food insecurity - worry: Not on file  . Food insecurity - inability: Not on file  . Transportation needs - medical: Not on file  . Transportation needs - non-medical: Not on file  Occupational History  . Not on file  Tobacco Use  . Smoking status: Never Smoker  . Smokeless tobacco: Never Used  Substance and Sexual Activity  . Alcohol use: No    Frequency: Never  . Drug use: Not on file  . Sexual activity: Not on file  Other Topics Concern  . Not on file  Social History Narrative  . Not on file   History reviewed. No  pertinent family history.    VITAL SIGNS BP 117/78   Pulse 66   Temp 98 F (36.7 C)   Resp 16   Ht 5\' 8"  (1.727 m)   Wt 174 lb 8 oz (79.2 kg)   SpO2 96%   BMI 26.53 kg/m    Outpatient Encounter Medications as of 02/09/2017  Medication Sig  . acetaminophen (TYLENOL) 325 MG tablet Take 2 tablets (650 mg total) by mouth every 6 (six) hours as needed for mild pain, moderate pain, fever or headache (or Fever >/= 101).  Marland Kitchen amoxicillin-clavulanate (AUGMENTIN) 875-125 MG tablet Take 1 tablet by mouth every 12 (twelve) hours for 14 days.  . calcium carbonate (TUMS - DOSED IN MG ELEMENTAL CALCIUM) 500 MG chewable tablet Give 1 tablet by mouth with meals as needed for indigestion / heartburn  . Catheters (FOLEY CATHETER 2-WAY) MISC Use as directed by urologist.  . collagenase (SANTYL) ointment Apply topically 2 (two) times daily. Apply to decubitus tissue with hydro therapy dressing change.  Marland Kitchen doxycycline (VIBRA-TABS) 100 MG tablet Take 1 tablet (100 mg total) by mouth every 12 (twelve) hours for 14 days.  Marland Kitchen lisinopril-hydrochlorothiazide (PRINZIDE,ZESTORETIC) 20-25 MG tablet TAKE 1 TABLET BY MOUTH DAILY. TAKE 1/2 TABLET AT BEDTIME.  Marland Kitchen  Multiple Vitamin (MULTIVITAMIN WITH MINERALS) TABS tablet Take 1 tablet by mouth daily.  . Nutritional Supplements (NUTRITIONAL SUPPLEMENT PO) House 2.0 - Med Pass - Give 240cc by mouth two times daily between meals for wound healing  . oxyCODONE (OXY IR/ROXICODONE) 5 MG immediate release tablet Take 1-2 tablets (5-10 mg total) by mouth every 4 (four) hours as needed for moderate pain.  Marland Kitchen. senna (SENOKOT) 8.6 MG TABS tablet Take 1 tablet (8.6 mg total) by mouth 2 (two) times daily.   No facility-administered encounter medications on file as of 02/09/2017.      SIGNIFICANT DIAGNOSTIC EXAMS  PREVIOUS:   01-26-17: ct of abdomen and pelvis:  1. Decreased size of abscess cavity at the medial aspect of the right buttock, status post debridement and placement of drain.  Generally decreased amount of subcutaneous emphysema throughout the soft tissues of the back with indwelling drain at the level of the coccyx. 2. Reactive retroperitoneal and pelvic lymphadenopathy. 3. Neuropathic arthropathy of both hips. 4. Small bilateral pleural effusions.  NO NEW EXAMS   LABS REVIEWED: PREVIOUS:   01-24-17: wbc 21.9; hgb 11.1; hct 31.4; mcv 80.9; plt 378; glucose 126; bun 15; creat 0.89; k+ 2.1; na++ 129; ca 8.1; liver normal albumin 2.5; mag 1.7; blood culture no growth; urine culture: <10,000 colonies 01-30-17: wbc 10.9; hgb 10.1; hct 31.3; mcv 86.5; plt 531; glucose 91; bun 10; creat 0.67; k+ 3.9; na++ 142; ca 8.1  NO NEW LABS    Review of Systems  Constitutional: Negative for malaise/fatigue.  Respiratory: Negative for cough and shortness of breath.   Cardiovascular: Negative for chest pain, palpitations and leg swelling.  Gastrointestinal: Negative for abdominal pain, constipation and heartburn.  Genitourinary:       Supra pubic foley   Musculoskeletal: Negative for back pain, joint pain and myalgias.  Skin:       Has sores   Neurological: Negative for dizziness.  Psychiatric/Behavioral: The patient is not nervous/anxious.     Physical Exam  Constitutional: He is oriented to person, place, and time. He appears well-developed and well-nourished. No distress.  Neck: No thyromegaly present.  Cardiovascular: Normal rate, regular rhythm, normal heart sounds and intact distal pulses.  Pedal pulses present   Pulmonary/Chest: Effort normal and breath sounds normal. No respiratory distress.  Abdominal: Soft. Bowel sounds are normal. He exhibits no distension.  Musculoskeletal: He exhibits no edema.  Paraplegia  Is status post right toes amputation 2-5th toes.    Lymphadenopathy:    He has no cervical adenopathy.  Neurological: He is alert and oriented to person, place, and time.  Skin: Skin is warm and dry. He is not diaphoretic.  Perianal abscess: 4.5 x 2.0  x 2.5 cm undermining: 1.8 cm 12-3 oclock 5 cm at 1oclock 2.7 at 3-6 oclock 3 cm at 6-9 oclock   Psychiatric: He has a normal mood and affect.       ASSESSMENT/ PLAN:  TODAY :  1. Ischiorectal abscess s/p I/D 2. Essential hypertension  Will continue lisinopril hct 20/25 mg 1 tab in the AM and 1/2 in the PM Will continue current wound care and will monitor his status.    Time spent with patient and family: 40 minutes discussed medication regimen; goals of care and discharge plans. Verbalized understanding.   MD is aware of resident's narcotic use and is in agreement with current plan of care. We will attempt to wean resident as apropriate   Synthia Innocenteborah Wynne Rozak NP [facility]  Contact [phone]  Monday through Friday 8am- 5pm  After hours call [phone]   

## 2017-02-17 DIAGNOSIS — L98419 Non-pressure chronic ulcer of buttock with unspecified severity: Secondary | ICD-10-CM | POA: Diagnosis not present

## 2017-02-20 DIAGNOSIS — N319 Neuromuscular dysfunction of bladder, unspecified: Secondary | ICD-10-CM | POA: Diagnosis not present

## 2017-02-20 DIAGNOSIS — Z9889 Other specified postprocedural states: Secondary | ICD-10-CM | POA: Diagnosis present

## 2017-02-20 DIAGNOSIS — Q059 Spina bifida, unspecified: Secondary | ICD-10-CM | POA: Diagnosis not present

## 2017-02-20 DIAGNOSIS — L98419 Non-pressure chronic ulcer of buttock with unspecified severity: Secondary | ICD-10-CM | POA: Diagnosis not present

## 2017-02-20 DIAGNOSIS — R262 Difficulty in walking, not elsewhere classified: Secondary | ICD-10-CM | POA: Diagnosis not present

## 2017-02-20 DIAGNOSIS — L89893 Pressure ulcer of other site, stage 3: Secondary | ICD-10-CM | POA: Diagnosis not present

## 2017-02-20 DIAGNOSIS — M25511 Pain in right shoulder: Secondary | ICD-10-CM | POA: Diagnosis not present

## 2017-02-20 DIAGNOSIS — L8992 Pressure ulcer of unspecified site, stage 2: Secondary | ICD-10-CM | POA: Diagnosis not present

## 2017-02-20 DIAGNOSIS — I10 Essential (primary) hypertension: Secondary | ICD-10-CM | POA: Diagnosis not present

## 2017-02-20 DIAGNOSIS — G8220 Paraplegia, unspecified: Secondary | ICD-10-CM | POA: Diagnosis not present

## 2017-02-20 DIAGNOSIS — L988 Other specified disorders of the skin and subcutaneous tissue: Secondary | ICD-10-CM | POA: Diagnosis present

## 2017-02-20 DIAGNOSIS — Z9359 Other cystostomy status: Secondary | ICD-10-CM | POA: Diagnosis not present

## 2017-02-20 DIAGNOSIS — K6139 Other ischiorectal abscess: Secondary | ICD-10-CM | POA: Diagnosis not present

## 2017-02-20 DIAGNOSIS — L0231 Cutaneous abscess of buttock: Secondary | ICD-10-CM | POA: Diagnosis present

## 2017-02-20 DIAGNOSIS — M25512 Pain in left shoulder: Secondary | ICD-10-CM | POA: Diagnosis not present

## 2017-02-20 DIAGNOSIS — G8929 Other chronic pain: Secondary | ICD-10-CM | POA: Diagnosis not present

## 2017-02-20 DIAGNOSIS — Q057 Lumbar spina bifida without hydrocephalus: Secondary | ICD-10-CM | POA: Diagnosis not present

## 2017-02-20 DIAGNOSIS — B351 Tinea unguium: Secondary | ICD-10-CM | POA: Diagnosis not present

## 2017-02-24 DIAGNOSIS — L98419 Non-pressure chronic ulcer of buttock with unspecified severity: Secondary | ICD-10-CM | POA: Diagnosis not present

## 2017-03-06 ENCOUNTER — Non-Acute Institutional Stay (SKILLED_NURSING_FACILITY): Payer: Medicare Other | Admitting: Adult Health

## 2017-03-06 ENCOUNTER — Encounter: Payer: Self-pay | Admitting: Adult Health

## 2017-03-06 DIAGNOSIS — N319 Neuromuscular dysfunction of bladder, unspecified: Secondary | ICD-10-CM

## 2017-03-06 DIAGNOSIS — I10 Essential (primary) hypertension: Secondary | ICD-10-CM | POA: Diagnosis not present

## 2017-03-06 DIAGNOSIS — G8220 Paraplegia, unspecified: Secondary | ICD-10-CM | POA: Diagnosis not present

## 2017-03-06 DIAGNOSIS — K5901 Slow transit constipation: Secondary | ICD-10-CM

## 2017-03-06 DIAGNOSIS — Q057 Lumbar spina bifida without hydrocephalus: Secondary | ICD-10-CM | POA: Diagnosis not present

## 2017-03-06 DIAGNOSIS — Z9359 Other cystostomy status: Secondary | ICD-10-CM

## 2017-03-06 HISTORY — DX: Slow transit constipation: K59.01

## 2017-03-06 NOTE — Progress Notes (Signed)
Location:  [facility] Room Number: 122 B Place of Service:  SNF (31)   CODE STATUS: Full code  Allergies  Allergen Reactions  . Vancomycin Itching    Chief Complaint  Patient presents with  . Medical Management of Chronic Issues    Hypertension; spina bifida; paraplegia; neurogenic bladder    HPI:  He is a 47 year old short term rehab patient being seen for the management of his chronic illnesses; hypertension; spina bifida; paraplegia; neurogenic bladder. He is not complaining of any uncontrolled pain; no change in appetite; no change in mood state. There are no nursing concerns at this time.   Past Medical History:  Diagnosis Date  . Chronic indwelling Foley catheter   . Spina bifida   . Wheelchair bound     Past Surgical History:  Procedure Laterality Date  . BACK SURGERY    . HIP SURGERY    . INCISION AND DRAINAGE OF WOUND N/A 01/24/2017   Procedure: IRRIGATION AND DEBRIDEMENT WOUND- BUTTOCK ABSCESS;  Surgeon: Almond Lint, MD;  Location: [facility];  Service: General;  Laterality: N/A;    Social History   Socioeconomic History  . Marital status: Single    Spouse name: Not on file  . Number of children: Not on file  . Years of education: Not on file  . Highest education level: Not on file  Social Needs  . Financial resource strain: Not on file  . Food insecurity - worry: Not on file  . Food insecurity - inability: Not on file  . Transportation needs - medical: Not on file  . Transportation needs - non-medical: Not on file  Occupational History  . Not on file  Tobacco Use  . Smoking status: Never Smoker  . Smokeless tobacco: Never Used  Substance and Sexual Activity  . Alcohol use: No    Frequency: Never  . Drug use: Not on file  . Sexual activity: Not on file  Other Topics Concern  . Not on file  Social History Narrative  . Not on file   History reviewed. No pertinent family history.    VITAL SIGNS BP 130/90   Pulse (!) 104    Temp 98.2 F (36.8 C)   Resp (!) 24   SpO2 96%   Outpatient Encounter Medications as of 03/06/2017  Medication Sig  . acetaminophen (TYLENOL) 325 MG tablet Take 2 tablets (650 mg total) by mouth every 6 (six) hours as needed for mild pain, moderate pain, fever or headache (or Fever >/= 101).  . calcium carbonate (TUMS - DOSED IN MG ELEMENTAL CALCIUM) 500 MG chewable tablet Give 1 tablet by mouth with meals as needed for indigestion / heartburn  . Catheters (FOLEY CATHETER 2-WAY) MISC Use as directed by urologist.  . collagenase (SANTYL) ointment Apply topically 2 (two) times daily. Apply to decubitus tissue with hydro therapy dressing change.  . docusate sodium (COLACE) 100 MG capsule Take 100 mg by mouth 2 (two) times daily.  Marland Kitchen lisinopril-hydrochlorothiazide (PRINZIDE,ZESTORETIC) 20-25 MG tablet TAKE 1 TABLET BY MOUTH DAILY. TAKE 1/2 TABLET AT BEDTIME.  . Multiple Vitamin (MULTIVITAMIN WITH MINERALS) TABS tablet Take 1 tablet by mouth daily.  . Nutritional Supplements (NUTRITIONAL SUPPLEMENT PO) House 2.0 - Med Pass - Give 240cc by mouth two times daily between meals for wound healing  . oxyCODONE (OXY IR/ROXICODONE) 5 MG immediate release tablet Take 1-2 tablets (5-10 mg total) by mouth every 4 (four) hours as needed for moderate pain.  . [DISCONTINUED]  senna (SENOKOT) 8.6 MG TABS tablet Take 1 tablet (8.6 mg total) by mouth 2 (two) times daily.   No facility-administered encounter medications on file as of 03/06/2017.      SIGNIFICANT DIAGNOSTIC EXAMS   PREVIOUS:   01-26-17: ct of abdomen and pelvis:  1. Decreased size of abscess cavity at the medial aspect of the right buttock, status post debridement and placement of drain. Generally decreased amount of subcutaneous emphysema throughout the soft tissues of the back with indwelling drain at the level of the coccyx. 2. Reactive retroperitoneal and pelvic lymphadenopathy. 3. Neuropathic arthropathy of both hips. 4. Small bilateral  pleural effusions.  NO NEW EXAMS   LABS REVIEWED: PREVIOUS:   01-24-17: wbc 21.9; hgb 11.1; hct 31.4; mcv 80.9; plt 378; glucose 126; bun 15; creat 0.89; k+ 2.1; na++ 129; ca 8.1; liver normal albumin 2.5; mag 1.7; blood culture no growth; urine culture: <10,000 colonies 01-30-17: wbc 10.9; hgb 10.1; hct 31.3; mcv 86.5; plt 531; glucose 91; bun 10; creat 0.67; k+ 3.9; na++ 142; ca 8.1  NO NEW LABS    Review of Systems  Constitutional: Negative for malaise/fatigue.  Respiratory: Negative for cough and shortness of breath.   Cardiovascular: Negative for chest pain, palpitations and leg swelling.  Gastrointestinal: Negative for abdominal pain, constipation and heartburn.  Genitourinary:       Supra pubic foley   Musculoskeletal: Negative for back pain, joint pain and myalgias.  Skin:       Has sores   Neurological: Negative for dizziness.  Psychiatric/Behavioral: The patient is not nervous/anxious.     Physical Exam  Constitutional: He is oriented to person, place, and time. He appears well-developed and well-nourished. No distress.  Neck: No thyromegaly present.  Cardiovascular: Normal rate, regular rhythm, normal heart sounds and intact distal pulses.  Pedal pulses faint   Pulmonary/Chest: Effort normal and breath sounds normal. No respiratory distress.  Abdominal: Soft. Bowel sounds are normal. He exhibits no distension. There is no tenderness.  Musculoskeletal: He exhibits no edema.  Paraplegia  Is status post right toes amputation 2-5th toes.  Atrophy to lower extremities   Lymphadenopathy:    He has no cervical adenopathy.  Neurological: He is alert and oriented to person, place, and time.  Skin: Skin is warm and dry. He is not diaphoretic.  Right buttock surgical wound: 9.6 x 3.2 x 2.3 cm undermining 8.5 cm at 5 oclock   Psychiatric: He has a normal mood and affect.    ASSESSMENT/ PLAN:  TODAY:   1. Essential hypertension: stable b/p 130/90 will continue lisinopril  hct 20/25 mg one in the Am and 1/2 in the Pm   2. Spina bifida aperta of lumbar spine with paraplegia: without change in status  3. Neurogenic bladder: stable has s/p foley: does change his own catheter  PREVIOUS   4. Slow transit constipation: stable will continue colace twice daily and senna twice daily   5. Ischiorectal abscess s/p I/D; will continue wound care as directed;  will continue oxycodone 5 or 10 mg every 4 hours as needed for pain  Has completed his abt; will continue to monitor       MD is aware of resident's narcotic use and is in agreement with current plan of care. We will attempt to wean resident as apropriate   Synthia Innocenteborah Emon Miggins NP [facility]  Contact [phone] Monday through Friday 8am- 5pm  After hours call [phone]

## 2017-03-10 DIAGNOSIS — L98419 Non-pressure chronic ulcer of buttock with unspecified severity: Secondary | ICD-10-CM | POA: Diagnosis not present

## 2017-03-17 ENCOUNTER — Encounter: Payer: Self-pay | Admitting: Adult Health

## 2017-03-17 ENCOUNTER — Non-Acute Institutional Stay (SKILLED_NURSING_FACILITY): Payer: Medicare Other | Admitting: Adult Health

## 2017-03-17 DIAGNOSIS — M25512 Pain in left shoulder: Secondary | ICD-10-CM | POA: Diagnosis not present

## 2017-03-17 DIAGNOSIS — M25511 Pain in right shoulder: Secondary | ICD-10-CM | POA: Diagnosis not present

## 2017-03-17 DIAGNOSIS — G8929 Other chronic pain: Secondary | ICD-10-CM | POA: Diagnosis not present

## 2017-03-17 DIAGNOSIS — L98419 Non-pressure chronic ulcer of buttock with unspecified severity: Secondary | ICD-10-CM | POA: Diagnosis not present

## 2017-03-17 NOTE — Progress Notes (Signed)
Location:   [facility] Room Number: 122 Place of Service:  SNF ([id number])   CODE STATUS: full code   Allergies  Allergen Reactions  . Vancomycin Itching    Chief Complaint  Patient presents with  . Acute Visit    shoulder pain     HPI:  He has a long history of having bilateral shoulder pain. He states from time to time it will flare up. He states that the pain is in shoulders and in his upper arms and axilla. He pain does not interfere with his ability to propel his wheelchair; he is able to sleep at night. The pain is achy in nature. There are no further nursing concerns at this time.   Past Medical History:  Diagnosis Date  . Chronic indwelling Foley catheter   . Spina bifida   . Wheelchair bound     Past Surgical History:  Procedure Laterality Date  . BACK SURGERY    . HIP SURGERY    . INCISION AND DRAINAGE OF WOUND N/A 01/24/2017   Procedure: IRRIGATION AND DEBRIDEMENT WOUND- BUTTOCK ABSCESS;  Surgeon: Almond Lint, MD;  Location: [facility];  Service: General;  Laterality: N/A;    Social History   Socioeconomic History  . Marital status: Single    Spouse name: Not on file  . Number of children: Not on file  . Years of education: Not on file  . Highest education level: Not on file  Social Needs  . Financial resource strain: Not on file  . Food insecurity - worry: Not on file  . Food insecurity - inability: Not on file  . Transportation needs - medical: Not on file  . Transportation needs - non-medical: Not on file  Occupational History  . Not on file  Tobacco Use  . Smoking status: Never Smoker  . Smokeless tobacco: Never Used  Substance and Sexual Activity  . Alcohol use: No    Frequency: Never  . Drug use: Not on file  . Sexual activity: Not on file  Other Topics Concern  . Not on file  Social History Narrative  . Not on file   History reviewed. No pertinent family history.    VITAL SIGNS BP 110/70   Pulse 95   Temp 98.1 F  (36.7 C)   Resp 18   Ht 5\' 8"  (1.727 m)   Wt 177 lb (80.3 kg)   SpO2 98%   BMI 26.91 kg/m    Outpatient Encounter Medications as of 03/17/2017  Medication Sig  . acetaminophen (TYLENOL) 325 MG tablet Take 2 tablets (650 mg total) by mouth every 6 (six) hours as needed for mild pain, moderate pain, fever or headache (or Fever >/= 101).  . calcium carbonate (TUMS - DOSED IN MG ELEMENTAL CALCIUM) 500 MG chewable tablet Give 1 tablet by mouth with meals as needed for indigestion / heartburn  . Catheters (FOLEY CATHETER 2-WAY) MISC Use as directed by urologist.  . collagenase (SANTYL) ointment Apply topically 2 (two) times daily. Apply to decubitus tissue with hydro therapy dressing change.  . docusate sodium (COLACE) 100 MG capsule Take 100 mg by mouth 2 (two) times daily.  Marland Kitchen lisinopril-hydrochlorothiazide (PRINZIDE,ZESTORETIC) 20-25 MG tablet TAKE 1 TABLET BY MOUTH DAILY. TAKE 1/2 TABLET AT BEDTIME.  . Multiple Vitamin (MULTIVITAMIN WITH MINERALS) TABS tablet Take 1 tablet by mouth daily.  . Nutritional Supplements (NUTRITIONAL SUPPLEMENT PO) House 2.0 - Med Pass - Give 240cc by mouth two times daily between  meals for wound healing  . oxyCODONE (OXY IR/ROXICODONE) 5 MG immediate release tablet Take 1-2 tablets (5-10 mg total) by mouth every 4 (four) hours as needed for moderate pain.   No facility-administered encounter medications on file as of 03/17/2017.      SIGNIFICANT DIAGNOSTIC EXAMS  PREVIOUS:   01-26-17: ct of abdomen and pelvis:  1. Decreased size of abscess cavity at the medial aspect of the right buttock, status post debridement and placement of drain. Generally decreased amount of subcutaneous emphysema throughout the soft tissues of the back with indwelling drain at the level of the coccyx. 2. Reactive retroperitoneal and pelvic lymphadenopathy. 3. Neuropathic arthropathy of both hips. 4. Small bilateral pleural effusions.  NO NEW EXAMS   LABS REVIEWED: PREVIOUS:    01-24-17: wbc 21.9; hgb 11.1; hct 31.4; mcv 80.9; plt 378; glucose 126; bun 15; creat 0.89; k+ 2.1; na++ 129; ca 8.1; liver normal albumin 2.5; mag 1.7; blood culture no growth; urine culture: <10,000 colonies 01-30-17: wbc 10.9; hgb 10.1; hct 31.3; mcv 86.5; plt 531; glucose 91; bun 10; creat 0.67; k+ 3.9; na++ 142; ca 8.1  NO NEW LABS    Review of Systems  Constitutional: Negative for malaise/fatigue.  Respiratory: Negative for cough and shortness of breath.   Cardiovascular: Negative for chest pain, palpitations and leg swelling.  Gastrointestinal: Negative for abdominal pain, constipation and heartburn.  Musculoskeletal: Positive for joint pain. Negative for back pain and myalgias.       Bilateral shoulder pain   Skin:       Has sores  Neurological: Negative for dizziness.  Psychiatric/Behavioral: The patient is not nervous/anxious.      Physical Exam  Constitutional: He is oriented to person, place, and time. He appears well-developed and well-nourished. No distress.  Neck: No thyromegaly present.  Cardiovascular: Normal rate, regular rhythm, normal heart sounds and intact distal pulses.  Pedal pulses faint   Pulmonary/Chest: Effort normal and breath sounds normal.  Abdominal: Soft. Bowel sounds are normal. He exhibits no distension. There is no tenderness.  Genitourinary:  Genitourinary Comments: Has suprapubic catheter   Musculoskeletal: He exhibits no edema.  Paraplegia  Is status post right toes amputation 2-5th toes.  Atrophy to lower extremities   Lymphadenopathy:    He has no cervical adenopathy.  Neurological: He is alert and oriented to person, place, and time.  Skin: Skin is warm and dry. He is not diaphoretic.  Right buttock post surgical wound: 9.3 x 2.7 x 1.8 cm with undermining at 5 oclock of 6.5 cm   Psychiatric: He has a normal mood and affect.     ASSESSMENT/ PLAN:  TODAY:   1. Chronic bilateral shoulder pain: will begin lidocaine patch 4 % to  bilateral shoulders on in the AM and off in the PM. Will begin aleve 2 tabs twice daily and will begin robaxin 500 mg every 6 hours as needed for pain and will monitor his status.   MD is aware of resident's narcotic use and is in agreement with current plan of care. We will attempt to wean resident as apropriate   Synthia Innocenteborah Cayetano Mikita NP [facility]  Contact [phone] Monday through Friday 8am- 5pm  After hours call [phone]

## 2017-03-24 DIAGNOSIS — L98419 Non-pressure chronic ulcer of buttock with unspecified severity: Secondary | ICD-10-CM | POA: Diagnosis not present

## 2017-03-27 ENCOUNTER — Encounter: Payer: Self-pay | Admitting: Internal Medicine

## 2017-03-27 ENCOUNTER — Non-Acute Institutional Stay (SKILLED_NURSING_FACILITY): Payer: Medicare Other | Admitting: Adult Health

## 2017-03-27 ENCOUNTER — Other Ambulatory Visit: Payer: Self-pay

## 2017-03-27 ENCOUNTER — Encounter: Payer: Self-pay | Admitting: Adult Health

## 2017-03-27 DIAGNOSIS — K6139 Other ischiorectal abscess: Secondary | ICD-10-CM

## 2017-03-27 DIAGNOSIS — Q057 Lumbar spina bifida without hydrocephalus: Secondary | ICD-10-CM

## 2017-03-27 DIAGNOSIS — G8220 Paraplegia, unspecified: Secondary | ICD-10-CM | POA: Diagnosis not present

## 2017-03-27 MED ORDER — OXYCODONE HCL 5 MG PO TABS: 5.0000 mg | ORAL_TABLET | ORAL | 0 refills | Status: DC | PRN

## 2017-03-27 NOTE — Progress Notes (Signed)
Location:   [facility] Room Number: 122 B Place of Service:  SNF (31)    CODE STATUS: Full Code  Allergies  Allergen Reactions  . Vancomycin Itching    Chief Complaint  Patient presents with  . Discharge Note    Discharging to Home 04/01/17    HPI:  He is being discharged to home with home health for pt/ot/rn. He will need a 16 inch light weight wheelchair and a roho cushion. He will need his prescription medications written. He will need to follow up with his medical provider.  He had been hospitalized for a perirectal abscess. He was admitted to this facility for wound management. The wound is now superficial and he is ready for discharge to home.    Past Medical History:  Diagnosis Date  . Chronic indwelling Foley catheter   . Spina bifida   . Wheelchair bound     Past Surgical History:  Procedure Laterality Date  . BACK SURGERY    . HIP SURGERY    . INCISION AND DRAINAGE OF WOUND N/A 01/24/2017   Procedure: IRRIGATION AND DEBRIDEMENT WOUND- BUTTOCK ABSCESS;  Surgeon: Almond LintByerly, Faera, MD;  Location: [facility];  Service: General;  Laterality: N/A;    Social History   Socioeconomic History  . Marital status: Single    Spouse name: Not on file  . Number of children: Not on file  . Years of education: Not on file  . Highest education level: Not on file  Social Needs  . Financial resource strain: Not on file  . Food insecurity - worry: Not on file  . Food insecurity - inability: Not on file  . Transportation needs - medical: Not on file  . Transportation needs - non-medical: Not on file  Occupational History  . Not on file  Tobacco Use  . Smoking status: Never Smoker  . Smokeless tobacco: Never Used  Substance and Sexual Activity  . Alcohol use: No    Frequency: Never  . Drug use: Not on file  . Sexual activity: Not on file  Other Topics Concern  . Not on file  Social History Narrative  . Not on file   History reviewed. No pertinent family  history.  VITAL SIGNS BP 130/74   Pulse 84   Temp 97.9 F (36.6 C)   Resp 20   Ht 5\' 8"  (1.727 m)   Wt 177 lb (80.3 kg)   SpO2 97%   BMI 26.91 kg/m   Patient's Medications  New Prescriptions   No medications on file  Previous Medications   ACETAMINOPHEN (TYLENOL) 325 MG TABLET    Take 2 tablets (650 mg total) by mouth every 6 (six) hours as needed for mild pain, moderate pain, fever or headache (or Fever >/= 101).   CALCIUM CARBONATE (TUMS - DOSED IN MG ELEMENTAL CALCIUM) 500 MG CHEWABLE TABLET    Give 1 tablet by mouth with meals as needed for indigestion / heartburn   CATHETERS (FOLEY CATHETER 2-WAY) MISC    Use as directed by urologist.   DOCUSATE SODIUM (COLACE) 100 MG CAPSULE    Take 100 mg by mouth 2 (two) times daily.   LIDOCAINE 4 % PTCH    Apply to both shoulders topically every 12 hours for pain   LISINOPRIL-HYDROCHLOROTHIAZIDE (PRINZIDE,ZESTORETIC) 20-25 MG TABLET    TAKE 1 TABLET BY MOUTH DAILY. TAKE 1/2 TABLET AT BEDTIME.   METHOCARBAMOL (ROBAXIN) 500 MG TABLET    Take 500 mg by mouth every  6 (six) hours as needed for muscle spasms.   MULTIPLE VITAMIN (MULTIVITAMIN WITH MINERALS) TABS TABLET    Take 1 tablet by mouth daily.   NAPROXEN SODIUM (ALEVE) 220 MG TABLET    Give 2 tablets by mouth two times daily   NUTRITIONAL SUPPLEMENTS (NUTRITIONAL SUPPLEMENT PO)    Liquid Protein - Give 30cc by mouth two times daily for wound healing   OXYCODONE (OXY IR/ROXICODONE) 5 MG IMMEDIATE RELEASE TABLET    Take 1-2 tablets (5-10 mg total) by mouth every 4 (four) hours as needed for moderate pain.  Modified Medications   No medications on file  Discontinued Medications   COLLAGENASE (SANTYL) OINTMENT    Apply topically 2 (two) times daily. Apply to decubitus tissue with hydro therapy dressing change.   NUTRITIONAL SUPPLEMENTS (NUTRITIONAL SUPPLEMENT PO)    House 2.0 - Med Pass - Give 240cc by mouth two times daily between meals for wound healing     SIGNIFICANT DIAGNOSTIC  EXAMS  PREVIOUS:   01-26-17: ct of abdomen and pelvis:  1. Decreased size of abscess cavity at the medial aspect of the right buttock, status post debridement and placement of drain. Generally decreased amount of subcutaneous emphysema throughout the soft tissues of the back with indwelling drain at the level of the coccyx. 2. Reactive retroperitoneal and pelvic lymphadenopathy. 3. Neuropathic arthropathy of both hips. 4. Small bilateral pleural effusions.  NO NEW EXAMS   LABS REVIEWED: PREVIOUS:   01-24-17: wbc 21.9; hgb 11.1; hct 31.4; mcv 80.9; plt 378; glucose 126; bun 15; creat 0.89; k+ 2.1; na++ 129; ca 8.1; liver normal albumin 2.5; mag 1.7; blood culture no growth; urine culture: <10,000 colonies 01-30-17: wbc 10.9; hgb 10.1; hct 31.3; mcv 86.5; plt 531; glucose 91; bun 10; creat 0.67; k+ 3.9; na++ 142; ca 8.1  NO NEW LABS    Review of Systems  Constitutional: Negative for malaise/fatigue.  Respiratory: Negative for cough and shortness of breath.   Cardiovascular: Negative for chest pain, palpitations and leg swelling.  Gastrointestinal: Negative for abdominal pain, constipation and heartburn.  Musculoskeletal: Negative for back pain, joint pain and myalgias.  Skin:       Sore is nearly resolved   Neurological: Negative for dizziness.  Psychiatric/Behavioral: The patient is not nervous/anxious.       Physical Exam  Constitutional: He is oriented to person, place, and time. He appears well-developed and well-nourished. No distress.  Neck: No thyromegaly present.  Cardiovascular: Normal rate, regular rhythm, normal heart sounds and intact distal pulses.  Pedal pulses faint   Pulmonary/Chest: Effort normal. No respiratory distress.  Abdominal: Soft. Bowel sounds are normal. He exhibits no distension. There is no tenderness.  Genitourinary:  Genitourinary Comments: supra pubic catheter   Musculoskeletal: He exhibits no edema.  Paraplegia  Is status post right toes amputation  2-5th toes.  Atrophy to lower extremities    Lymphadenopathy:    He has no cervical adenopathy.  Neurological: He is alert and oriented to person, place, and time.  Skin: Skin is warm and dry. He is not diaphoretic.  Right buttock wound is superficial without signs of infection present    Psychiatric: He has a normal mood and affect.    ASSESSMENT/ PLAN:  Patient is being discharged with the following home health services:  Pt/ot/rn: to evaluate and treat for strengthening adl training and wound management   Patient is being discharged with the following durable medical equipment:  16 inch light weight wheelchair with Roho cushion; foot  rests; antitippers brake extensions to allow patient to maintain his current level of independence with his adls; which cannot be done with a walker. Is able to self propel   Patient has been advised to f/u with their PCP in 1-2 weeks to bring them up to date on their rehab stay.  Social services at facility was responsible for arranging this appointment.  Pt was provided with a 30 day supply of prescriptions for medications and refills must be obtained from their PCP.  For controlled substances, a more limited supply may be provided adequate until PCP appointment only.  A 30 day supply of his prescription medications have been written per the medication list as above  #20 oxycodone 5 mg tabs   Time spent with patient 45 minutes: discussed discharge expectations; home health needed dme and medications. Has verbalized understanding.    Synthia Innocent NP [facility]  Contact [phone] Monday through Friday 8am- 5pm  After hours call [phone]

## 2017-03-27 NOTE — Telephone Encounter (Signed)
Discharged with patient 

## 2017-03-31 DIAGNOSIS — L89893 Pressure ulcer of other site, stage 3: Secondary | ICD-10-CM | POA: Diagnosis not present

## 2017-03-31 DIAGNOSIS — L98419 Non-pressure chronic ulcer of buttock with unspecified severity: Secondary | ICD-10-CM | POA: Diagnosis not present

## 2017-04-03 DIAGNOSIS — G8221 Paraplegia, complete: Secondary | ICD-10-CM | POA: Diagnosis not present

## 2017-04-03 DIAGNOSIS — S91302D Unspecified open wound, left foot, subsequent encounter: Secondary | ICD-10-CM | POA: Diagnosis not present

## 2017-04-03 DIAGNOSIS — N319 Neuromuscular dysfunction of bladder, unspecified: Secondary | ICD-10-CM | POA: Diagnosis not present

## 2017-04-03 DIAGNOSIS — L89314 Pressure ulcer of right buttock, stage 4: Secondary | ICD-10-CM | POA: Diagnosis not present

## 2017-04-03 DIAGNOSIS — I10 Essential (primary) hypertension: Secondary | ICD-10-CM | POA: Diagnosis not present

## 2017-04-03 DIAGNOSIS — Z993 Dependence on wheelchair: Secondary | ICD-10-CM | POA: Diagnosis not present

## 2017-04-03 DIAGNOSIS — Z436 Encounter for attention to other artificial openings of urinary tract: Secondary | ICD-10-CM | POA: Diagnosis not present

## 2017-04-03 DIAGNOSIS — Q057 Lumbar spina bifida without hydrocephalus: Secondary | ICD-10-CM | POA: Diagnosis not present

## 2017-04-06 DIAGNOSIS — L89314 Pressure ulcer of right buttock, stage 4: Secondary | ICD-10-CM | POA: Diagnosis not present

## 2017-04-06 DIAGNOSIS — N319 Neuromuscular dysfunction of bladder, unspecified: Secondary | ICD-10-CM | POA: Diagnosis not present

## 2017-04-06 DIAGNOSIS — I10 Essential (primary) hypertension: Secondary | ICD-10-CM | POA: Diagnosis not present

## 2017-04-06 DIAGNOSIS — G8221 Paraplegia, complete: Secondary | ICD-10-CM | POA: Diagnosis not present

## 2017-04-06 DIAGNOSIS — S91302D Unspecified open wound, left foot, subsequent encounter: Secondary | ICD-10-CM | POA: Diagnosis not present

## 2017-04-06 DIAGNOSIS — Q057 Lumbar spina bifida without hydrocephalus: Secondary | ICD-10-CM | POA: Diagnosis not present

## 2017-04-09 ENCOUNTER — Ambulatory Visit (INDEPENDENT_AMBULATORY_CARE_PROVIDER_SITE_OTHER): Payer: Medicare Other | Admitting: Internal Medicine

## 2017-04-09 VITALS — BP 121/80 | HR 117 | Temp 99.0°F

## 2017-04-09 DIAGNOSIS — L89522 Pressure ulcer of left ankle, stage 2: Secondary | ICD-10-CM

## 2017-04-09 DIAGNOSIS — K6139 Other ischiorectal abscess: Secondary | ICD-10-CM

## 2017-04-09 DIAGNOSIS — I10 Essential (primary) hypertension: Secondary | ICD-10-CM

## 2017-04-09 NOTE — Progress Notes (Signed)
   [name] Noralee CharsAsiyah [name], MD Phone: [phone]  Reason For Visit:   Patient has not been seen in over 3 years at this practice.  He is here today for follow-up on his ulcers as he was recently discharged from a nursing home.  # CHRONIC HTN: Current Meds -   currently on Prinzide Reports good compliance,took meds today. Tolerating well, w/o complaints. Denies CP, dyspnea, HA, edema, dizziness / lightheadedness  # Wounds  Gluteal wound postop surgical debridement -Patient is followed by surgery for his wounds.  He recently had surgery on a gluteal abscess and plans to have it checked by them on April 1.  He feels like the wounds are healing well.  He denies any fevers or chills nausea or vomiting.  Pressure ulcer on medial malleolus Patient also has a small pressure ulcer on his medial malleolus. this is been going on for about 1 year it was previously scabbed over but has opened up because it was hit while he was in the hospital.  He is following along with wound care with topical antibiotic.  Past Medical History Reviewed problem list.  Medications- reviewed and updated No additions to family history Social history- patient is a non-smoker  Objective: BP 121/80 (BP Location: Left Arm, Patient Position: Sitting, Cuff Size: Normal)   Pulse (!) 117   Temp 99 F (37.2 C) (Oral)   SpO2 100%  Gen: NAD, alert, cooperative with exam Cardio: regular rate and rhythm, S1S2 heard, no murmurs appreciated Pulm: clear to auscultation bilaterally, no wheezes, rhonchi or rales Skin: Small 1 cm ulcer noted on the medial malleolus of the left foot, healing gluteal wound from surgical debridement of gluteal abscess    Assessment/Plan: See problem based a/p  Pressure ulcer Following with wound care for this pressure ulcer on the ankle.  Uses topical antibiotic ointment.  No signs of infection.  Follow-up as needed  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/24/2017 Following  with surgery Next postop appointment on April 1 Surgical wound looks as if it is healing well No signs of infection  Essential hypertension Well-controlled, recent bmet, continue Prinzide

## 2017-04-09 NOTE — Patient Instructions (Addendum)
Please follow up with surgery to have the wound looked at.  Does not look infected right now no further concerns, continue home health as discussed.  If you need wound care please let me know.

## 2017-04-10 ENCOUNTER — Encounter: Payer: Self-pay | Admitting: Internal Medicine

## 2017-04-10 DIAGNOSIS — S91302D Unspecified open wound, left foot, subsequent encounter: Secondary | ICD-10-CM | POA: Diagnosis not present

## 2017-04-10 DIAGNOSIS — L89314 Pressure ulcer of right buttock, stage 4: Secondary | ICD-10-CM | POA: Diagnosis not present

## 2017-04-10 DIAGNOSIS — Q057 Lumbar spina bifida without hydrocephalus: Secondary | ICD-10-CM | POA: Diagnosis not present

## 2017-04-10 DIAGNOSIS — G8221 Paraplegia, complete: Secondary | ICD-10-CM | POA: Diagnosis not present

## 2017-04-10 DIAGNOSIS — N319 Neuromuscular dysfunction of bladder, unspecified: Secondary | ICD-10-CM | POA: Diagnosis not present

## 2017-04-10 DIAGNOSIS — I10 Essential (primary) hypertension: Secondary | ICD-10-CM | POA: Diagnosis not present

## 2017-04-10 NOTE — Assessment & Plan Note (Signed)
Well-controlled, recent bmet, continue Prinzide

## 2017-04-10 NOTE — Assessment & Plan Note (Signed)
Following with wound care for this pressure ulcer on the ankle.  Uses topical antibiotic ointment.  No signs of infection.  Follow-up as needed

## 2017-04-10 NOTE — Assessment & Plan Note (Signed)
Following with surgery Next postop appointment on April 1 Surgical wound looks as if it is healing well No signs of infection

## 2017-04-13 ENCOUNTER — Telehealth: Payer: Self-pay

## 2017-04-13 DIAGNOSIS — L89314 Pressure ulcer of right buttock, stage 4: Secondary | ICD-10-CM | POA: Diagnosis not present

## 2017-04-13 DIAGNOSIS — Q057 Lumbar spina bifida without hydrocephalus: Secondary | ICD-10-CM | POA: Diagnosis not present

## 2017-04-13 DIAGNOSIS — I10 Essential (primary) hypertension: Secondary | ICD-10-CM | POA: Diagnosis not present

## 2017-04-13 DIAGNOSIS — G8220 Paraplegia, unspecified: Secondary | ICD-10-CM

## 2017-04-13 DIAGNOSIS — N319 Neuromuscular dysfunction of bladder, unspecified: Secondary | ICD-10-CM | POA: Diagnosis not present

## 2017-04-13 DIAGNOSIS — G8221 Paraplegia, complete: Secondary | ICD-10-CM | POA: Diagnosis not present

## 2017-04-13 DIAGNOSIS — S91302D Unspecified open wound, left foot, subsequent encounter: Secondary | ICD-10-CM | POA: Diagnosis not present

## 2017-04-13 NOTE — Telephone Encounter (Signed)
Redmond PullingSherah Lowe- RN with [facility] calling, requesting orders for Pt to have social work consult to help him apply for medicaid as well as personal care services.  Her call back [phone] Shawna OrleansMeredith B Thomsen, RN

## 2017-04-14 NOTE — Telephone Encounter (Signed)
I have place order for physical therapy.  Anthony Hineseborah Moore if you could please see if patient is interested in personal care service. At last visit, he denied any resource needs. Let me know if there is anything I need to do.

## 2017-04-17 DIAGNOSIS — L89314 Pressure ulcer of right buttock, stage 4: Secondary | ICD-10-CM | POA: Diagnosis not present

## 2017-04-17 DIAGNOSIS — N319 Neuromuscular dysfunction of bladder, unspecified: Secondary | ICD-10-CM | POA: Diagnosis not present

## 2017-04-17 DIAGNOSIS — G8221 Paraplegia, complete: Secondary | ICD-10-CM | POA: Diagnosis not present

## 2017-04-17 DIAGNOSIS — S91302D Unspecified open wound, left foot, subsequent encounter: Secondary | ICD-10-CM | POA: Diagnosis not present

## 2017-04-17 DIAGNOSIS — I10 Essential (primary) hypertension: Secondary | ICD-10-CM | POA: Diagnosis not present

## 2017-04-17 DIAGNOSIS — Q057 Lumbar spina bifida without hydrocephalus: Secondary | ICD-10-CM | POA: Diagnosis not present

## 2017-04-20 ENCOUNTER — Telehealth: Payer: Self-pay

## 2017-04-20 DIAGNOSIS — I10 Essential (primary) hypertension: Secondary | ICD-10-CM | POA: Diagnosis not present

## 2017-04-20 DIAGNOSIS — Q057 Lumbar spina bifida without hydrocephalus: Secondary | ICD-10-CM | POA: Diagnosis not present

## 2017-04-20 DIAGNOSIS — S91302D Unspecified open wound, left foot, subsequent encounter: Secondary | ICD-10-CM | POA: Diagnosis not present

## 2017-04-20 DIAGNOSIS — L89314 Pressure ulcer of right buttock, stage 4: Secondary | ICD-10-CM | POA: Diagnosis not present

## 2017-04-20 DIAGNOSIS — G8221 Paraplegia, complete: Secondary | ICD-10-CM | POA: Diagnosis not present

## 2017-04-20 DIAGNOSIS — N319 Neuromuscular dysfunction of bladder, unspecified: Secondary | ICD-10-CM | POA: Diagnosis not present

## 2017-04-20 NOTE — Telephone Encounter (Signed)
RN informed. Deseree Blount, CMA  

## 2017-04-20 NOTE — Telephone Encounter (Signed)
Redmond PullingSherah Lowe- RN with [facility] calling for orders for additional RN [facility] visits for wound care. Her call back [phone] Shawna OrleansMeredith B Ashlynn Gunnels, RN

## 2017-04-20 NOTE — Telephone Encounter (Addendum)
Please provide wound care orders/verbal orders. Thanks Ariyannah Pauling

## 2017-04-23 DIAGNOSIS — Q057 Lumbar spina bifida without hydrocephalus: Secondary | ICD-10-CM | POA: Diagnosis not present

## 2017-04-23 DIAGNOSIS — S91302D Unspecified open wound, left foot, subsequent encounter: Secondary | ICD-10-CM | POA: Diagnosis not present

## 2017-04-23 DIAGNOSIS — L89314 Pressure ulcer of right buttock, stage 4: Secondary | ICD-10-CM | POA: Diagnosis not present

## 2017-04-23 DIAGNOSIS — I10 Essential (primary) hypertension: Secondary | ICD-10-CM | POA: Diagnosis not present

## 2017-04-23 DIAGNOSIS — N319 Neuromuscular dysfunction of bladder, unspecified: Secondary | ICD-10-CM | POA: Diagnosis not present

## 2017-04-23 DIAGNOSIS — G8221 Paraplegia, complete: Secondary | ICD-10-CM | POA: Diagnosis not present

## 2017-04-24 ENCOUNTER — Telehealth: Payer: Self-pay

## 2017-04-24 NOTE — Telephone Encounter (Signed)
Verbal orders given to South TempleJacqueline. Ples SpecterAlisa Laylani Pudwill, RN [facility] RN)

## 2017-04-24 NOTE — Telephone Encounter (Signed)
Please call and give them a verbal order.  Thanks Sharmaine Bain

## 2017-04-24 NOTE — Telephone Encounter (Signed)
Adela LankJacqueline- SW with [facility] called, states when she called patient to set up consult he asked her to wait and come next week. Needing orders changed for next week. Her call [phone] Shawna OrleansMeredith B Thomsen, RN

## 2017-04-27 DIAGNOSIS — L89314 Pressure ulcer of right buttock, stage 4: Secondary | ICD-10-CM | POA: Diagnosis not present

## 2017-04-27 DIAGNOSIS — G8221 Paraplegia, complete: Secondary | ICD-10-CM | POA: Diagnosis not present

## 2017-04-27 DIAGNOSIS — I10 Essential (primary) hypertension: Secondary | ICD-10-CM | POA: Diagnosis not present

## 2017-04-27 DIAGNOSIS — S91302D Unspecified open wound, left foot, subsequent encounter: Secondary | ICD-10-CM | POA: Diagnosis not present

## 2017-04-27 DIAGNOSIS — Q057 Lumbar spina bifida without hydrocephalus: Secondary | ICD-10-CM | POA: Diagnosis not present

## 2017-04-27 DIAGNOSIS — N319 Neuromuscular dysfunction of bladder, unspecified: Secondary | ICD-10-CM | POA: Diagnosis not present

## 2017-04-27 NOTE — Telephone Encounter (Signed)
Fuller PlanJacqueine- SW with [facility] calling, saw patient today. Requesting VO to see patient 1x week for 1 week. Her call back [phone] Shawna OrleansMeredith B Thomsen, RN

## 2017-04-28 NOTE — Telephone Encounter (Signed)
Please provide verbal order. Thanks

## 2017-04-28 NOTE — Telephone Encounter (Signed)
Verbal orders given. Meredith B Thomsen, RN  

## 2017-04-30 NOTE — Addendum Note (Signed)
Addended by: Noralee CharsMIKELL, Oluwatobi Ruppe Z on: 04/30/2017 12:15 PM   Modules accepted: Orders

## 2017-04-30 NOTE — Telephone Encounter (Signed)
Placed order for home health social work per request of RN to help apply for medicaid. Patient did not desire an aide when I saw him at last visit. He felt very comfortable with his current resources.

## 2017-05-01 DIAGNOSIS — Q057 Lumbar spina bifida without hydrocephalus: Secondary | ICD-10-CM | POA: Diagnosis not present

## 2017-05-01 DIAGNOSIS — L89314 Pressure ulcer of right buttock, stage 4: Secondary | ICD-10-CM | POA: Diagnosis not present

## 2017-05-01 DIAGNOSIS — I10 Essential (primary) hypertension: Secondary | ICD-10-CM | POA: Diagnosis not present

## 2017-05-01 DIAGNOSIS — N319 Neuromuscular dysfunction of bladder, unspecified: Secondary | ICD-10-CM | POA: Diagnosis not present

## 2017-05-01 DIAGNOSIS — G8221 Paraplegia, complete: Secondary | ICD-10-CM | POA: Diagnosis not present

## 2017-05-01 DIAGNOSIS — S91302D Unspecified open wound, left foot, subsequent encounter: Secondary | ICD-10-CM | POA: Diagnosis not present

## 2017-05-04 DIAGNOSIS — Q057 Lumbar spina bifida without hydrocephalus: Secondary | ICD-10-CM | POA: Diagnosis not present

## 2017-05-04 DIAGNOSIS — L89314 Pressure ulcer of right buttock, stage 4: Secondary | ICD-10-CM | POA: Diagnosis not present

## 2017-05-04 DIAGNOSIS — I10 Essential (primary) hypertension: Secondary | ICD-10-CM | POA: Diagnosis not present

## 2017-05-04 DIAGNOSIS — S91302D Unspecified open wound, left foot, subsequent encounter: Secondary | ICD-10-CM | POA: Diagnosis not present

## 2017-05-04 DIAGNOSIS — G8221 Paraplegia, complete: Secondary | ICD-10-CM | POA: Diagnosis not present

## 2017-05-04 DIAGNOSIS — N319 Neuromuscular dysfunction of bladder, unspecified: Secondary | ICD-10-CM | POA: Diagnosis not present

## 2017-05-07 DIAGNOSIS — Q057 Lumbar spina bifida without hydrocephalus: Secondary | ICD-10-CM | POA: Diagnosis not present

## 2017-05-07 DIAGNOSIS — I10 Essential (primary) hypertension: Secondary | ICD-10-CM | POA: Diagnosis not present

## 2017-05-07 DIAGNOSIS — N319 Neuromuscular dysfunction of bladder, unspecified: Secondary | ICD-10-CM | POA: Diagnosis not present

## 2017-05-07 DIAGNOSIS — G8221 Paraplegia, complete: Secondary | ICD-10-CM | POA: Diagnosis not present

## 2017-05-07 DIAGNOSIS — S91302D Unspecified open wound, left foot, subsequent encounter: Secondary | ICD-10-CM | POA: Diagnosis not present

## 2017-05-07 DIAGNOSIS — L89314 Pressure ulcer of right buttock, stage 4: Secondary | ICD-10-CM | POA: Diagnosis not present

## 2017-05-12 DIAGNOSIS — Q057 Lumbar spina bifida without hydrocephalus: Secondary | ICD-10-CM | POA: Diagnosis not present

## 2017-05-12 DIAGNOSIS — N319 Neuromuscular dysfunction of bladder, unspecified: Secondary | ICD-10-CM | POA: Diagnosis not present

## 2017-05-12 DIAGNOSIS — G8221 Paraplegia, complete: Secondary | ICD-10-CM | POA: Diagnosis not present

## 2017-05-12 DIAGNOSIS — S91302D Unspecified open wound, left foot, subsequent encounter: Secondary | ICD-10-CM | POA: Diagnosis not present

## 2017-05-12 DIAGNOSIS — I10 Essential (primary) hypertension: Secondary | ICD-10-CM | POA: Diagnosis not present

## 2017-05-12 DIAGNOSIS — L89314 Pressure ulcer of right buttock, stage 4: Secondary | ICD-10-CM | POA: Diagnosis not present

## 2017-05-15 DIAGNOSIS — S91302D Unspecified open wound, left foot, subsequent encounter: Secondary | ICD-10-CM | POA: Diagnosis not present

## 2017-05-15 DIAGNOSIS — I10 Essential (primary) hypertension: Secondary | ICD-10-CM | POA: Diagnosis not present

## 2017-05-15 DIAGNOSIS — L89314 Pressure ulcer of right buttock, stage 4: Secondary | ICD-10-CM | POA: Diagnosis not present

## 2017-05-15 DIAGNOSIS — G8221 Paraplegia, complete: Secondary | ICD-10-CM | POA: Diagnosis not present

## 2017-05-15 DIAGNOSIS — Q057 Lumbar spina bifida without hydrocephalus: Secondary | ICD-10-CM | POA: Diagnosis not present

## 2017-05-15 DIAGNOSIS — N319 Neuromuscular dysfunction of bladder, unspecified: Secondary | ICD-10-CM | POA: Diagnosis not present

## 2017-05-18 DIAGNOSIS — I10 Essential (primary) hypertension: Secondary | ICD-10-CM | POA: Diagnosis not present

## 2017-05-18 DIAGNOSIS — N319 Neuromuscular dysfunction of bladder, unspecified: Secondary | ICD-10-CM | POA: Diagnosis not present

## 2017-05-18 DIAGNOSIS — L89314 Pressure ulcer of right buttock, stage 4: Secondary | ICD-10-CM | POA: Diagnosis not present

## 2017-05-18 DIAGNOSIS — G8221 Paraplegia, complete: Secondary | ICD-10-CM | POA: Diagnosis not present

## 2017-05-18 DIAGNOSIS — S91302D Unspecified open wound, left foot, subsequent encounter: Secondary | ICD-10-CM | POA: Diagnosis not present

## 2017-05-18 DIAGNOSIS — Q057 Lumbar spina bifida without hydrocephalus: Secondary | ICD-10-CM | POA: Diagnosis not present

## 2017-05-20 ENCOUNTER — Telehealth: Payer: Self-pay

## 2017-05-20 NOTE — Telephone Encounter (Signed)
Brynda Greathouse- SW with [facility] calling to inform MD he had orders to go out and meet with patient but patient has declined to meet with him.  Call back with any concerns [phone] Shawna Orleans, RN

## 2017-05-22 DIAGNOSIS — I10 Essential (primary) hypertension: Secondary | ICD-10-CM | POA: Diagnosis not present

## 2017-05-22 DIAGNOSIS — Q057 Lumbar spina bifida without hydrocephalus: Secondary | ICD-10-CM | POA: Diagnosis not present

## 2017-05-22 DIAGNOSIS — G8221 Paraplegia, complete: Secondary | ICD-10-CM | POA: Diagnosis not present

## 2017-05-22 DIAGNOSIS — S91302D Unspecified open wound, left foot, subsequent encounter: Secondary | ICD-10-CM | POA: Diagnosis not present

## 2017-05-22 DIAGNOSIS — L89314 Pressure ulcer of right buttock, stage 4: Secondary | ICD-10-CM | POA: Diagnosis not present

## 2017-05-22 DIAGNOSIS — N319 Neuromuscular dysfunction of bladder, unspecified: Secondary | ICD-10-CM | POA: Diagnosis not present

## 2017-05-26 DIAGNOSIS — I10 Essential (primary) hypertension: Secondary | ICD-10-CM | POA: Diagnosis not present

## 2017-05-26 DIAGNOSIS — L89314 Pressure ulcer of right buttock, stage 4: Secondary | ICD-10-CM | POA: Diagnosis not present

## 2017-05-26 DIAGNOSIS — S91302D Unspecified open wound, left foot, subsequent encounter: Secondary | ICD-10-CM | POA: Diagnosis not present

## 2017-05-26 DIAGNOSIS — G8221 Paraplegia, complete: Secondary | ICD-10-CM | POA: Diagnosis not present

## 2017-05-26 DIAGNOSIS — N319 Neuromuscular dysfunction of bladder, unspecified: Secondary | ICD-10-CM | POA: Diagnosis not present

## 2017-05-26 DIAGNOSIS — Q057 Lumbar spina bifida without hydrocephalus: Secondary | ICD-10-CM | POA: Diagnosis not present

## 2017-05-29 DIAGNOSIS — S91302D Unspecified open wound, left foot, subsequent encounter: Secondary | ICD-10-CM | POA: Diagnosis not present

## 2017-05-29 DIAGNOSIS — N319 Neuromuscular dysfunction of bladder, unspecified: Secondary | ICD-10-CM | POA: Diagnosis not present

## 2017-05-29 DIAGNOSIS — I10 Essential (primary) hypertension: Secondary | ICD-10-CM | POA: Diagnosis not present

## 2017-05-29 DIAGNOSIS — G8221 Paraplegia, complete: Secondary | ICD-10-CM | POA: Diagnosis not present

## 2017-05-29 DIAGNOSIS — Q057 Lumbar spina bifida without hydrocephalus: Secondary | ICD-10-CM | POA: Diagnosis not present

## 2017-05-29 DIAGNOSIS — L89314 Pressure ulcer of right buttock, stage 4: Secondary | ICD-10-CM | POA: Diagnosis not present

## 2017-06-08 DIAGNOSIS — K6139 Other ischiorectal abscess: Secondary | ICD-10-CM | POA: Diagnosis not present

## 2017-06-09 ENCOUNTER — Telehealth: Payer: Self-pay

## 2017-06-09 NOTE — Telephone Encounter (Signed)
Beth- RN with [facility] calling regarding patient. Plan is to d/c pt from [facility] services and set him up with wound care supplies through the mail if MD is agreeable. Please call and let her know if this plan is ok. Call back [phone] Shawna Orleans, RN

## 2017-06-10 NOTE — Telephone Encounter (Signed)
Beth was at lunch so I left her a message to call me back. Khrystian Schauf Bruna Potter, CMA

## 2017-06-10 NOTE — Telephone Encounter (Signed)
Plans sounds good to me. Please let HH know. Please make sure they let me know what supplies I need to order for him

## 2017-06-11 ENCOUNTER — Other Ambulatory Visit: Payer: Self-pay | Admitting: General Surgery

## 2017-06-11 DIAGNOSIS — K6139 Other ischiorectal abscess: Secondary | ICD-10-CM

## 2017-06-18 NOTE — Telephone Encounter (Signed)
Beth informed. Deseree Bruna Potter, CMA

## 2017-06-23 ENCOUNTER — Ambulatory Visit
Admission: RE | Admit: 2017-06-23 | Discharge: 2017-06-23 | Disposition: A | Discharge status: Home / Self Care | Payer: Medicare Other | Source: Ambulatory Visit | Attending: General Surgery | Admitting: General Surgery

## 2017-06-23 DIAGNOSIS — K6139 Other ischiorectal abscess: Secondary | ICD-10-CM | POA: Diagnosis not present

## 2017-06-23 DIAGNOSIS — K4090 Unilateral inguinal hernia, without obstruction or gangrene, not specified as recurrent: Secondary | ICD-10-CM | POA: Diagnosis not present

## 2017-06-23 MED ORDER — IOHEXOL 300 MG/ML  SOLN
100.0000 mL | Freq: Once | INTRAMUSCULAR | Status: AC | PRN
  Administered 2017-06-23: 100 mL via INTRAVENOUS

## 2017-06-25 NOTE — Progress Notes (Signed)
Please let patient know CT looks good.

## 2017-07-27 DIAGNOSIS — K6139 Other ischiorectal abscess: Secondary | ICD-10-CM | POA: Diagnosis not present

## 2017-11-28 ENCOUNTER — Other Ambulatory Visit: Payer: Self-pay

## 2017-11-28 ENCOUNTER — Emergency Department (HOSPITAL_COMMUNITY)
Admission: EM | Admit: 2017-11-28 | Discharge: 2017-11-28 | Disposition: A | Discharge status: Home / Self Care | Payer: Medicare Other | Attending: Emergency Medicine | Admitting: Emergency Medicine

## 2017-11-28 DIAGNOSIS — Z79899 Other long term (current) drug therapy: Secondary | ICD-10-CM | POA: Insufficient documentation

## 2017-11-28 DIAGNOSIS — J45909 Unspecified asthma, uncomplicated: Secondary | ICD-10-CM | POA: Insufficient documentation

## 2017-11-28 DIAGNOSIS — T83198A Other mechanical complication of other urinary devices and implants, initial encounter: Secondary | ICD-10-CM | POA: Insufficient documentation

## 2017-11-28 DIAGNOSIS — I10 Essential (primary) hypertension: Secondary | ICD-10-CM | POA: Diagnosis not present

## 2017-11-28 DIAGNOSIS — Y828 Other medical devices associated with adverse incidents: Secondary | ICD-10-CM | POA: Insufficient documentation

## 2017-11-28 DIAGNOSIS — Z435 Encounter for attention to cystostomy: Secondary | ICD-10-CM

## 2017-11-28 DIAGNOSIS — Z466 Encounter for fitting and adjustment of urinary device: Secondary | ICD-10-CM | POA: Diagnosis not present

## 2017-11-28 NOTE — Discharge Instructions (Addendum)
Follow up with your urologist as needed. Return to the emergency department with any further urgent catheter problems.

## 2017-11-28 NOTE — ED Provider Notes (Signed)
[facility] EMERGENCY DEPARTMENT Provider Note   CSN: [id number] Arrival date & time: 11/28/17  2257     History   Chief Complaint Chief Complaint  Patient presents with  . Urinary catheter Problem    HPI Anthony [name] is a 47 y.o. male.  Patient with a history of spina bifida, chronic indwelling suprapubic catheter presents for re-insertion of his foley. He reports he was changing it out today and was unable to reinsert the new one. He has had the suprapubic catheter for 10 years and reports he has never had any problem changing it to a new tube once a month. He denies significant pain. There has been a small amount of bleeding which he states is normal.   The history is provided by the patient. No language interpreter was used.    Past Medical History:  Diagnosis Date  . Chronic indwelling Foley catheter   . Spina bifida   . Wheelchair bound     Patient Active Problem List   Diagnosis Date Noted  . Slow transit constipation 03/06/2017  . Neurogenic bladder 03/06/2017  . Suprapubic catheter (HCC) 03/06/2017  . Paraplegia (HCC) 01/29/2017  . Pressure injury of skin 01/27/2017  . 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/24/2017 01/24/2017  . Status post debridement 01/24/2017  . Hyperlipidemia 03/11/2014  . Spina bifida aperta of lumbar spine (HCC) 09/30/2012  . Routine adult health maintenance 03/26/2012  . Shoulder pain, bilateral 01/27/2011  . ASTHMA, PERSISTENT 06/30/2007  . Essential hypertension 06/01/2007  . ALLERGIC RHINITIS 06/01/2007  . DYSPNEA 06/01/2007  . ANXIETY 03/19/2006  . 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 VENOUS INSUFFICIENCY, CHRONIC 03/19/2006  . Pressure ulcer 03/19/2006    Past Surgical History:  Procedure Laterality Date  . BACK SURGERY    . HIP SURGERY    . INCISION AND DRAINAGE OF WOUND N/A 01/24/2017   Procedure: IRRIGATION AND DEBRIDEMENT WOUND- BUTTOCK ABSCESS;  Surgeon: Almond Lint, MD;  Location: [facility];  Service:  General;  Laterality: N/A;        Home Medications    Prior to Admission medications   Medication Sig Start Date End Date Taking? Authorizing Provider  Catheters (FOLEY CATHETER 2-WAY) MISC Use as directed by urologist. 12/30/12   Leona Singleton, MD  lisinopril-hydrochlorothiazide (PRINZIDE,ZESTORETIC) 20-25 MG tablet TAKE 1 TABLET BY MOUTH DAILY. TAKE 1/2 TABLET AT BEDTIME. 11/10/16   Mikell, Antionette Poles, MD  Multiple Vitamin (MULTIVITAMIN WITH MINERALS) TABS tablet Take 1 tablet by mouth daily.    [provider]  Nutritional Supplements (NUTRITIONAL SUPPLEMENT PO) Liquid Protein - Give 30cc by mouth two times daily for wound healing 03/12/17   [provider]    Family History No family history on file.  Social History Social History   Tobacco Use  . Smoking status: Never Smoker  . Smokeless tobacco: Never Used  Substance Use Topics  . Alcohol use: No    Frequency: Never  . Drug use: Not on file     Allergies   Vancomycin   Review of Systems Review of Systems  Constitutional: Negative for fever.  Gastrointestinal: Negative for abdominal distention, abdominal pain and nausea.  Genitourinary:       See HPI.  Musculoskeletal: Negative for myalgias.     Physical Exam Updated Vital Signs BP 133/74 (BP Location: Right Arm)   Pulse 64   Temp 99.2 F (37.3 C) (Oral)   Resp 18   Wt 79.4 kg   SpO2 96%   BMI  26.61 kg/m   Physical Exam  Constitutional: He is oriented to person, place, and time. He appears well-developed and well-nourished.  Neck: Normal range of motion.  Pulmonary/Chest: Effort normal.  Abdominal: Soft. He exhibits no distension. There is no tenderness.  Musculoskeletal: Normal range of motion.  Neurological: He is alert and oriented to person, place, and time.  Skin: Skin is warm and dry.  Psychiatric: He has a normal mood and affect.  Nursing note and vitals reviewed.    ED Treatments / Results  Labs (all labs  ordered are listed, but only abnormal results are displayed) Labs Reviewed - No data to display  EKG None  Radiology No results found.  Procedures BLADDER CATHETERIZATION Date/Time: 11/28/2017 11:32 PM Performed by: Elpidio Anis, PA-C Authorized by: Elpidio Anis, PA-C   Consent:    Consent obtained:  Verbal   Consent given by:  Patient Anesthesia (see MAR for exact dosages):    Anesthesia method:  None Procedure details:    Provider performed due to:  Complicated insertion   Catheter insertion:  Indwelling   Catheter type:  Foley and coude   Catheter size:  18 Fr   Bladder irrigation: no     Number of attempts:  2   Urine characteristics:  Blood-tinged Post-procedure details:    Patient tolerance of procedure:  Tolerated well, no immediate complications Comments:     2 attempts with 18Fr were unsuccessful. One attempt with size 18 coude was successfully inserted on one attempt. Procedure performed by Dr. Patria Mane.   (including critical care time)  Medications Ordered in ED Medications - No data to display   Initial Impression / Assessment and Plan / ED Course  I have reviewed the triage vital signs and the nursing notes.  Pertinent labs & imaging results that were available during my care of the patient were reviewed by me and considered in my medical decision making (see chart for details).     Patient with longstanding suprapubic catheter unable to reinsert tonight while changing per his normal routine. No pain. He usually uses a 20Fr.  Attempt to insert one of his catheters was unsuccessful. An 18Fr foley was attempted without passing. 18Fr coude used with success. Plan will be to allow dilation of the stoma and re-insert a 20Fr coude to maintain his normal size catheter.   20Fr coude inserted without difficulty.  No sign or symptom of infection. Labs are not felt necessary.       Final Clinical Impressions(s) / ED Diagnoses   Final diagnoses:  None    1. Suprapubic catheter complication  ED Discharge Orders    None       Danne Harbor 11/28/17 2350    Azalia Bilis, MD 11/28/17 2357

## 2017-11-28 NOTE — ED Triage Notes (Signed)
Patient's states that he was changing his urinary cath; and the new one would could not be inserted.

## 2018-01-02 ENCOUNTER — Other Ambulatory Visit: Payer: Self-pay | Admitting: Internal Medicine

## 2018-01-02 DIAGNOSIS — I10 Essential (primary) hypertension: Secondary | ICD-10-CM

## 2018-01-04 ENCOUNTER — Other Ambulatory Visit: Payer: Self-pay

## 2018-01-04 DIAGNOSIS — I10 Essential (primary) hypertension: Secondary | ICD-10-CM

## 2018-01-04 MED ORDER — LISINOPRIL-HYDROCHLOROTHIAZIDE 20-25 MG PO TABS: 1.0000 | ORAL_TABLET | Freq: Every day | ORAL | 0 refills | Status: DC

## 2018-01-04 NOTE — Telephone Encounter (Signed)
First Rx failed, resent. Fleeger, Anthony [name], CMA

## 2018-01-04 NOTE — Addendum Note (Signed)
Addended by: Jone BasemanFLEEGER, Lakiesha Ralphs D on: 01/04/2018 01:56 PM   Modules accepted: Orders

## 2018-04-15 ENCOUNTER — Other Ambulatory Visit: Payer: Self-pay | Admitting: Family Medicine

## 2018-04-15 DIAGNOSIS — I10 Essential (primary) hypertension: Secondary | ICD-10-CM

## 2018-09-07 ENCOUNTER — Other Ambulatory Visit: Payer: Self-pay | Admitting: Family Medicine

## 2018-09-07 DIAGNOSIS — I10 Essential (primary) hypertension: Secondary | ICD-10-CM

## 2019-07-13 ENCOUNTER — Other Ambulatory Visit: Payer: Self-pay | Admitting: Family Medicine

## 2019-07-13 DIAGNOSIS — I10 Essential (primary) hypertension: Secondary | ICD-10-CM

## 2019-07-13 NOTE — Telephone Encounter (Signed)
Please have patient schedule a check up as it has been >2 years since he has been seen in our clinic. I will provide 1 month refill to get him through until his follow up.  Thank you!!

## 2019-07-14 NOTE — Telephone Encounter (Signed)
Attempted to call pt and schedule appt, phone did ring but said pt unable to accept phones calls at this time. Daltin Crist Bruna Potter, CMA

## 2019-08-22 ENCOUNTER — Other Ambulatory Visit: Payer: Self-pay | Admitting: Family Medicine

## 2019-08-22 DIAGNOSIS — I10 Essential (primary) hypertension: Secondary | ICD-10-CM

## 2019-09-20 ENCOUNTER — Other Ambulatory Visit: Payer: Self-pay | Admitting: Family Medicine

## 2019-09-20 DIAGNOSIS — I10 Essential (primary) hypertension: Secondary | ICD-10-CM

## 2019-09-20 NOTE — Telephone Encounter (Signed)
Patient has not been seen by clinic since 2019. Please inform patient that I will not provide any further refills until seen. Thank you.

## 2019-09-21 ENCOUNTER — Other Ambulatory Visit: Payer: Self-pay

## 2019-09-21 DIAGNOSIS — I10 Essential (primary) hypertension: Secondary | ICD-10-CM

## 2019-09-22 ENCOUNTER — Other Ambulatory Visit: Payer: Self-pay | Admitting: Family Medicine

## 2019-09-22 DIAGNOSIS — I10 Essential (primary) hypertension: Secondary | ICD-10-CM

## 2019-09-23 ENCOUNTER — Ambulatory Visit (INDEPENDENT_AMBULATORY_CARE_PROVIDER_SITE_OTHER): Payer: Medicare Other | Admitting: Family Medicine

## 2019-09-23 ENCOUNTER — Encounter: Payer: Self-pay | Admitting: Family Medicine

## 2019-09-23 ENCOUNTER — Other Ambulatory Visit: Payer: Self-pay

## 2019-09-23 VITALS — BP 130/80 | HR 86

## 2019-09-23 DIAGNOSIS — E785 Hyperlipidemia, unspecified: Secondary | ICD-10-CM

## 2019-09-23 DIAGNOSIS — Z0000 Encounter for general adult medical examination without abnormal findings: Secondary | ICD-10-CM | POA: Diagnosis not present

## 2019-09-23 DIAGNOSIS — I10 Essential (primary) hypertension: Secondary | ICD-10-CM

## 2019-09-23 MED ORDER — LISINOPRIL-HYDROCHLOROTHIAZIDE 20-25 MG PO TABS: ORAL_TABLET | ORAL | 1 refills | Status: DC

## 2019-09-23 NOTE — Patient Instructions (Signed)
It was nice to meet you.  Today we are checking your cholesterol levels and checking for hepatitis C which is just a normal health maintenance lab.  I will call you if labs abnormal.  If normal we will communicate via MyChart.  Please continue to be active on the trails.  Adopt a diet is low in salt low in saturated fats, and processed foods as well as fried foods.  This will improve your blood pressure and cholesterol levels.  Dr. Salvadore Dom

## 2019-09-23 NOTE — Progress Notes (Signed)
    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  Was previously on medication.  Not on medication now due to financial reasons.  States he was on something prior that was affordable.  Has changed his diet to improve his cholesterol levels.  PERTINENT  PMH / PSH: Paraplegic, neurogenic bladder (suprapubic catheter)  OBJECTIVE:   BP 130/80   Pulse 86   SpO2 97%   General: Appears well, no acute distress. Age appropriate. Cardiac: RRR, normal heart sounds, no murmurs Respiratory: CTAB, normal effort Extremities: No edema or cyanosis.  ASSESSMENT/PLAN:   Essential hypertension At goal. Continue current medication regimen.  -Refill placed during encounter  Hyperlipidemia -Obtain lipid panel -Consider restarting cost-effective statin  Health care maintenance -Obtain Hep C blood work    Lavonda Jumbo, DO [facility]

## 2019-09-24 LAB — LIPID PANEL
Chol/HDL Ratio: 7.6 ratio — ABNORMAL HIGH (ref 0.0–5.0)
Cholesterol, Total: 189 mg/dL (ref 100–199)
HDL: 25 mg/dL — ABNORMAL LOW (ref >39)
LDL Chol Calc (NIH): 134 mg/dL — ABNORMAL HIGH (ref 0–99)
Triglycerides: 165 mg/dL — ABNORMAL HIGH (ref 0–149)
VLDL Cholesterol Cal: 30 mg/dL (ref 5–40)

## 2019-09-24 LAB — HCV AB W REFLEX TO QUANT PCR: HCV Ab: 0.1 s/co ratio (ref 0.0–0.9)

## 2019-09-24 LAB — HCV INTERPRETATION

## 2019-09-27 DIAGNOSIS — Z0000 Encounter for general adult medical examination without abnormal findings: Secondary | ICD-10-CM | POA: Insufficient documentation

## 2019-09-27 NOTE — Assessment & Plan Note (Addendum)
-  Obtain lipid panel -Consider restarting cost-effective statin

## 2019-09-27 NOTE — Assessment & Plan Note (Signed)
-  Obtain Hep C blood work

## 2019-09-27 NOTE — Assessment & Plan Note (Signed)
At goal. Continue current medication regimen.  -Refill placed during encounter

## 2019-10-10 MED ORDER — SIMVASTATIN 40 MG PO TABS: 40.0000 mg | ORAL_TABLET | Freq: Every day | ORAL | 3 refills | Status: DC

## 2019-10-10 NOTE — Addendum Note (Signed)
Addended by: Alonna Buckler on: 10/10/2019 05:02 PM   Modules accepted: Orders

## 2019-11-22 ENCOUNTER — Other Ambulatory Visit: Payer: Self-pay

## 2019-11-22 ENCOUNTER — Emergency Department (HOSPITAL_COMMUNITY): Payer: Medicare Other

## 2019-11-22 ENCOUNTER — Emergency Department (HOSPITAL_COMMUNITY)
Admission: EM | Admit: 2019-11-22 | Discharge: 2019-11-23 | Disposition: A | Discharge status: Home / Self Care | Payer: Medicare Other | Attending: Emergency Medicine | Admitting: Emergency Medicine

## 2019-11-22 DIAGNOSIS — R0789 Other chest pain: Secondary | ICD-10-CM | POA: Diagnosis not present

## 2019-11-22 DIAGNOSIS — R531 Weakness: Secondary | ICD-10-CM | POA: Diagnosis not present

## 2019-11-22 DIAGNOSIS — E876 Hypokalemia: Secondary | ICD-10-CM | POA: Diagnosis not present

## 2019-11-22 DIAGNOSIS — R079 Chest pain, unspecified: Secondary | ICD-10-CM | POA: Diagnosis not present

## 2019-11-22 DIAGNOSIS — I10 Essential (primary) hypertension: Secondary | ICD-10-CM | POA: Insufficient documentation

## 2019-11-22 DIAGNOSIS — Z20822 Contact with and (suspected) exposure to covid-19: Secondary | ICD-10-CM | POA: Insufficient documentation

## 2019-11-22 DIAGNOSIS — Z79899 Other long term (current) drug therapy: Secondary | ICD-10-CM | POA: Insufficient documentation

## 2019-11-22 DIAGNOSIS — R000 Tachycardia, unspecified: Secondary | ICD-10-CM | POA: Diagnosis not present

## 2019-11-22 LAB — URINALYSIS, ROUTINE W REFLEX MICROSCOPIC
Bilirubin Urine: NEGATIVE
Glucose, UA: NEGATIVE mg/dL
Ketones, ur: NEGATIVE mg/dL
Nitrite: NEGATIVE
Protein, ur: NEGATIVE mg/dL
Specific Gravity, Urine: 1.011 (ref 1.005–1.030)
pH: 9 — ABNORMAL HIGH (ref 5.0–8.0)

## 2019-11-22 LAB — BASIC METABOLIC PANEL
Anion gap: 13 (ref 5–15)
BUN: 18 mg/dL (ref 6–20)
CO2: 25 mmol/L (ref 22–32)
Calcium: 9 mg/dL (ref 8.9–10.3)
Chloride: 95 mmol/L — ABNORMAL LOW (ref 98–111)
Creatinine, Ser: 0.6 mg/dL — ABNORMAL LOW (ref 0.61–1.24)
GFR, Estimated: >60 mL/min (ref >60)
Glucose, Bld: 135 mg/dL — ABNORMAL HIGH (ref 70–99)
Potassium: 2.8 mmol/L — ABNORMAL LOW (ref 3.5–5.1)
Sodium: 133 mmol/L — ABNORMAL LOW (ref 135–145)

## 2019-11-22 LAB — CBC
HCT: 38.4 % — ABNORMAL LOW (ref 39.0–52.0)
Hemoglobin: 12.4 g/dL — ABNORMAL LOW (ref 13.0–17.0)
MCH: 26.4 pg (ref 26.0–34.0)
MCHC: 32.3 g/dL (ref 30.0–36.0)
MCV: 81.9 fL (ref 80.0–100.0)
Platelets: 438 10*3/uL — ABNORMAL HIGH (ref 150–400)
RBC: 4.69 MIL/uL (ref 4.22–5.81)
RDW: 13.9 % (ref 11.5–15.5)
WBC: 17.1 10*3/uL — ABNORMAL HIGH (ref 4.0–10.5)
nRBC: 0 % (ref 0.0–0.2)

## 2019-11-22 LAB — LIPASE, BLOOD: Lipase: 29 U/L (ref 11–51)

## 2019-11-22 LAB — D-DIMER, QUANTITATIVE (NOT AT ARMC): D-Dimer, Quant: 1.22 ug/mL-FEU — ABNORMAL HIGH (ref 0.00–0.50)

## 2019-11-22 LAB — HEPATIC FUNCTION PANEL
ALT: 18 U/L (ref 0–44)
AST: 16 U/L (ref 15–41)
Albumin: 3.3 g/dL — ABNORMAL LOW (ref 3.5–5.0)
Alkaline Phosphatase: 71 U/L (ref 38–126)
Bilirubin, Direct: <0.1 mg/dL (ref 0.0–0.2)
Total Bilirubin: 0.6 mg/dL (ref 0.3–1.2)
Total Protein: 8.4 g/dL — ABNORMAL HIGH (ref 6.5–8.1)

## 2019-11-22 LAB — TROPONIN I (HIGH SENSITIVITY)
Troponin I (High Sensitivity): 9 ng/L (ref <18)
Troponin I (High Sensitivity): <2 ng/L (ref <18)

## 2019-11-22 LAB — RESPIRATORY PANEL BY RT PCR (FLU A&B, COVID)
Influenza A by PCR: NEGATIVE
Influenza B by PCR: NEGATIVE
SARS Coronavirus 2 by RT PCR: NEGATIVE

## 2019-11-22 MED ORDER — IOHEXOL 350 MG/ML SOLN
100.0000 mL | Freq: Once | INTRAVENOUS | Status: AC | PRN
  Administered 2019-11-22: 100 mL via INTRAVENOUS

## 2019-11-22 MED ORDER — POTASSIUM CHLORIDE CRYS ER 20 MEQ PO TBCR
40.0000 meq | EXTENDED_RELEASE_TABLET | Freq: Once | ORAL | Status: AC
  Administered 2019-11-22: 40 meq via ORAL
  Filled 2019-11-22: qty 2

## 2019-11-22 MED ORDER — POTASSIUM CHLORIDE 10 MEQ/100ML IV SOLN
10.0000 meq | Freq: Once | INTRAVENOUS | Status: AC
  Administered 2019-11-22: 10 meq via INTRAVENOUS
  Filled 2019-11-22: qty 100

## 2019-11-22 MED ORDER — POTASSIUM CHLORIDE ER 20 MEQ PO TBCR: 20.0000 meq | EXTENDED_RELEASE_TABLET | Freq: Every day | ORAL | 0 refills | Status: DC

## 2019-11-22 MED ORDER — CEPHALEXIN 500 MG PO CAPS: 500.0000 mg | ORAL_CAPSULE | Freq: Three times a day (TID) | ORAL | 0 refills | Status: AC

## 2019-11-22 MED ORDER — ACETAMINOPHEN 500 MG PO TABS
1000.0000 mg | ORAL_TABLET | Freq: Once | ORAL | Status: AC
  Administered 2019-11-22: 1000 mg via ORAL
  Filled 2019-11-22: qty 2

## 2019-11-22 NOTE — ED Notes (Signed)
Patient transported to CT 

## 2019-11-22 NOTE — ED Provider Notes (Addendum)
[facility] EMERGENCY DEPARTMENT Provider Note   CSN: [id number] Arrival date & time: 11/22/19  1705     History Chief Complaint  Patient presents with  . Chest Pain    Anthony [name] is a 49 y.o. male presenting for evaluation of chest pain  Patient states on Saturday, 3 days ago, he had acute onset chest pain.  Is described as a tightness which is also sharp and pressure in the center of his chest.  Does not radiate.  It resolved over the course of 24 hours without intervention.  He had no associated shortness of breath, nausea, vomiting, diaphoresis.  Pain was not worse with inspiration.  Did not worsen with activity.  He has been chest pain-free since.  He reports a strong family history of cardiac problems, thus prompting his ER visit today.  He denies any recent medication changes.  He denies recent fevers, chills, cough, shortness of breath, nausea, vomiting abdominal pain, abnormal bowel movements.  No change in urination, he does have a Foley due to spina bifida and paraplegia.  Additional history obtained from chart review.  Patient with a history of spina bifida, paraplegia, chronic Foley catheter, hyperlipidemia   HPI  Past Medical History:  Diagnosis Date  . Chronic indwelling Foley catheter   . Spina bifida   . Wheelchair bound     Patient Active Problem List   Diagnosis Date Noted  . Health care maintenance 09/27/2019  . Slow transit constipation 03/06/2017  . Neurogenic bladder 03/06/2017  . Suprapubic catheter (HCC) 03/06/2017  . Paraplegia (HCC) 01/29/2017  . Pressure injury of skin 01/27/2017  . 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/24/2017 01/24/2017  . Status post debridement 01/24/2017  . Hyperlipidemia 03/11/2014  . Spina bifida aperta of lumbar spine (HCC) 09/30/2012  . Routine adult health maintenance 03/26/2012  . Shoulder pain, bilateral 01/27/2011  . Essential hypertension 06/01/2007  . ALLERGIC RHINITIS 06/01/2007  . ANXIETY  03/19/2006  . 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 VENOUS INSUFFICIENCY, CHRONIC 03/19/2006  . Pressure ulcer 03/19/2006    Past Surgical History:  Procedure Laterality Date  . BACK SURGERY    . HIP SURGERY    . INCISION AND DRAINAGE OF WOUND N/A 01/24/2017   Procedure: IRRIGATION AND DEBRIDEMENT WOUND- BUTTOCK ABSCESS;  Surgeon: Almond Lint, MD;  Location: [facility];  Service: General;  Laterality: N/A;       No family history on file.  Social History   Tobacco Use  . Smoking status: Never Smoker  . Smokeless tobacco: Never Used  Vaping Use  . Vaping Use: Never used  Substance Use Topics  . Alcohol use: No  . Drug use: Not on file    Home Medications Prior to Admission medications   Medication Sig Start Date End Date Taking? Authorizing Provider  lisinopril-hydrochlorothiazide (ZESTORETIC) 20-25 MG tablet TAKE 1 TABLET BY MOUTH ONCE DAILY. TAKE 1/2 (ONE-HALF) AT BEDTIME 09/23/19  Yes Autry-Lott, Randa Evens, DO  Multiple Vitamin (MULTIVITAMIN WITH MINERALS) TABS tablet Take 1 tablet by mouth daily.   Yes [provider]  Catheters (FOLEY CATHETER 2-WAY) MISC Use as directed by urologist. Patient not taking: Reported on 11/22/2019 12/30/12   Leona Singleton, MD  cephALEXin (KEFLEX) 500 MG capsule Take 1 capsule (500 mg total) by mouth 3 (three) times daily for 5 days. 11/22/19 11/27/19  Samya Siciliano, PA-C  potassium chloride 20 MEQ TBCR Take 20 mEq by mouth daily for 7 days. 11/22/19 11/29/19  Shatana Saxton, PA-C  simvastatin (ZOCOR)  40 MG tablet Take 1 tablet (40 mg total) by mouth at bedtime. Patient not taking: Reported on 11/22/2019 10/10/19   Autry-Lott, Randa EvensSimone, DO    Allergies    Vancomycin  Review of Systems   Review of Systems  Cardiovascular: Positive for chest pain (resolved).  All other systems reviewed and are negative.   Physical Exam Updated Vital Signs BP 114/66   Pulse 89   Temp 99.4 F (37.4 C)   Resp 19   Ht 5\' 8"  (1.727 m)   Wt 74.8  kg   SpO2 99%   BMI 25.09 kg/m   Physical Exam Vitals and nursing note reviewed.  Constitutional:      General: He is not in acute distress.    Appearance: He is well-developed.     Comments: Resting in the bed in no acute distress  HENT:     Head: Normocephalic and atraumatic.  Eyes:     Extraocular Movements: Extraocular movements intact.     Conjunctiva/sclera: Conjunctivae normal.     Pupils: Pupils are equal, round, and reactive to light.  Cardiovascular:     Rate and Rhythm: Normal rate and regular rhythm.     Pulses: Normal pulses.     Comments: Mildly tachycardic around 105 Pulmonary:     Effort: Pulmonary effort is normal. No respiratory distress.     Breath sounds: Normal breath sounds. No wheezing.     Comments: Clear lung sounds Abdominal:     General: There is no distension.     Palpations: Abdomen is soft. There is no mass.     Tenderness: There is no abdominal tenderness. There is no guarding or rebound.  Musculoskeletal:     Cervical back: Normal range of motion and neck supple.     Right lower leg: No edema.     Left lower leg: No edema.     Comments: Congenitally shortened lower extremities.   Skin:    General: Skin is warm and dry.     Capillary Refill: Capillary refill takes less than 2 seconds.     Comments: Multiple foot ulcers.  Left lateral foot with ulcer with minimal drainage.  Right plantar ulcer unstageable and lateral ulcer without signs of infection.  Neurological:     Mental Status: He is alert and oriented to person, place, and time.     ED Results / Procedures / Treatments   Labs (all labs ordered are listed, but only abnormal results are displayed) Labs Reviewed  BASIC METABOLIC PANEL - Abnormal; Notable for the following components:      Result Value   Sodium 133 (*)    Potassium 2.8 (*)    Chloride 95 (*)    Glucose, Bld 135 (*)    Creatinine, Ser 0.60 (*)    All other components within normal limits  CBC - Abnormal; Notable  for the following components:   WBC 17.1 (*)    Hemoglobin 12.4 (*)    HCT 38.4 (*)    Platelets 438 (*)    All other components within normal limits  URINALYSIS, ROUTINE W REFLEX MICROSCOPIC - Abnormal; Notable for the following components:   APPearance CLOUDY (*)    pH 9.0 (*)    Hgb urine dipstick SMALL (*)    Leukocytes,Ua MODERATE (*)    Bacteria, UA RARE (*)    All other components within normal limits  HEPATIC FUNCTION PANEL - Abnormal; Notable for the following components:   Total Protein 8.4 (*)  Albumin 3.3 (*)    All other components within normal limits  D-DIMER, QUANTITATIVE (NOT AT [facility]) - Abnormal; Notable for the following components:   D-Dimer, Quant 1.22 (*)    All other components within normal limits  RESPIRATORY PANEL BY RT PCR (FLU A&B, COVID)  URINE CULTURE  LIPASE, BLOOD  TROPONIN I (HIGH SENSITIVITY)  TROPONIN I (HIGH SENSITIVITY)    EKG EKG Interpretation  Date/Time:  Tuesday November 22 2019 17:24:41 EDT Ventricular Rate:  127 PR Interval:  146 QRS Duration: 80 QT Interval:  308 QTC Calculation: 447 R Axis:   -28 Text Interpretation: Sinus tachycardia Minimal voltage criteria for LVH, may be normal variant ( R in aVL ) Inferior-posterior infarct , age undetermined Abnormal ECG Confirmed by Marianna Fuss ([id number]) on 11/22/2019 8:05:19 PM   Radiology DG Chest 2 View  Result Date: 11/22/2019 CLINICAL DATA:  Chest pain for 1 day EXAM: CHEST - 2 VIEW COMPARISON:  None. FINDINGS: The heart size and mediastinal contours are within normal limits. Both lungs are clear. The visualized skeletal structures are unremarkable. IMPRESSION: No active cardiopulmonary disease. Electronically Signed   By: Sharlet Salina M.D.   On: 11/22/2019 18:10   CT Angio Chest PE W and/or Wo Contrast  Result Date: 11/22/2019 CLINICAL DATA:  Chest pain, generalized weakness EXAM: CT ANGIOGRAPHY CHEST WITH CONTRAST TECHNIQUE: Multidetector CT imaging of the chest was  performed using the standard protocol during bolus administration of intravenous contrast. Multiplanar CT image reconstructions and MIPs were obtained to evaluate the vascular anatomy. CONTRAST:  OMNIPAQUE IOHEXOL 350 MG/ML SOLN COMPARISON:  11/22/2019 FINDINGS: Cardiovascular: This is a technically adequate evaluation of the pulmonary vasculature. No filling defects or pulmonary emboli. The heart is unremarkable without pericardial effusion. Normal appearance of the thoracic aorta. Mediastinum/Nodes: No enlarged mediastinal, hilar, or axillary lymph nodes. Thyroid gland, trachea, and esophagus demonstrate no significant findings. Lungs/Pleura: No acute airspace disease, effusion, or pneumothorax. Central airways are widely patent. Upper Abdomen: No acute abnormality. Musculoskeletal: No acute bony abnormalities. Reconstructed images demonstrate no additional findings. Review of the MIP images confirms the above findings. IMPRESSION: 1. No evidence of pulmonary embolus. 2. No acute intrathoracic process. Electronically Signed   By: Sharlet Salina M.D.   On: 11/22/2019 23:55    Procedures Procedures (including critical care time)  Medications Ordered in ED Medications  potassium chloride 10 mEq in 100 mL IVPB (0 mEq Intravenous Stopped 11/22/19 2224)  potassium chloride SA (KLOR-CON) CR tablet 40 mEq (40 mEq Oral Given 11/22/19 2041)  acetaminophen (TYLENOL) tablet 1,000 mg (1,000 mg Oral Given 11/22/19 2041)  potassium chloride 10 mEq in 100 mL IVPB (0 mEq Intravenous Stopped 11/22/19 2356)  iohexol (OMNIPAQUE) 350 MG/ML injection 100 mL (100 mLs Intravenous Contrast Given 11/22/19 2334)    ED Course  I have reviewed the triage vital signs and the nursing notes.  Pertinent labs & imaging results that were available during my care of the patient were reviewed by me and considered in my medical decision making (see chart for details).   MDM Rules/Calculators/A&P  Patient presenting for  evaluation of chest pain that occurred several days ago.  On exam, he appears nontoxic.  He has been chest pain-free for several days.  However history is concerning due to family history of early cardiac death.  Patient with other risk factors including hyperlipidemia.  EKG shows T wave inversions that appear changed from several years ago in leads V2 and V3.  However story is atypical, and  he is chest pain-free at this time.  Initial labs interpreted by me, initial troponin normal at 9.  Will trend.  Mild leukocytosis, likely elevated in the setting of hemoconcentration.  However patient also with low-grade fever and tachycardia.  Consider PE.  Consider infection.  Will obtain chest x-ray, urine, and dimer.  Additionally, patient hypokalemic at 2.8.  He reports a history of this in the past, is not currently being supplemented.  Will give 2 rund IV and p.o. medicine.  Heart rate improved with Tylenol, as did fever.  As such, likely infection related.  However dimer is elevated at 1.22.  Patient will need a CTA.  Urine without signs of infection.  Chest x-ray viewed and interpreted by me, no pneumonia pnx, effusion. Discussed with attending, Dr. Stevie Kern evaluated the pt. Will consult with cardiology.   Discussed with Dr. Deforest Hoyles from cardiology, who feels pt can f/u OP as patient is chest pain-free and troponins are negative.  CTA negative.  Discussed findings with patient and mom.  Discussed treatment for possible infection with Keflex, this will cover for possible urinary and/or skin infection.  Discussed importance of close follow-up with cardiology, resources given.  Encourage patient to follow-up with podiatry for foot ulcers.  At this time, patient appears safe for discharge.  Return precautions given.  Patient states he understands and agrees to plan.  Final Clinical Impression(s) / ED Diagnoses Final diagnoses:  Atypical chest pain  Hypokalemia    Rx / DC Orders ED Discharge Orders          Ordered    potassium chloride 20 MEQ TBCR  Daily        11/22/19 2345    cephALEXin (KEFLEX) 500 MG capsule  3 times daily        11/22/19 2345    Ambulatory referral to Cardiology        11/22/19 [address] PA-C 11/22/19 2358    Alveria Apley, PA-C 11/22/19 2359    Milagros Loll, MD 11/23/19 [phone]

## 2019-11-22 NOTE — Discharge Instructions (Signed)
Take potassium as prescribed.  Follow-up with your primary care doctor for recheck of your potassium levels. Take antibiotics as prescribed. Make sure you stay well-hydrated with water. Call the cardiology group listed below to set up a follow-up appointment. Follow up with Dr. [name] for further management of your foot ulcers.  Return to the emergency room if you develop recurrent chest pain with any new, worsening, or concerning symptoms

## 2019-11-22 NOTE — ED Triage Notes (Signed)
Pt arrives POV with complaints of chest pain, tightness and generalized weakness which began om Saturday and resolved Saturday night. Pt states he feels good today but wants to get checked out d/t family history.

## 2019-11-23 LAB — URINE CULTURE

## 2019-12-07 ENCOUNTER — Ambulatory Visit (INDEPENDENT_AMBULATORY_CARE_PROVIDER_SITE_OTHER): Payer: Medicare Other | Admitting: Cardiology

## 2019-12-07 ENCOUNTER — Other Ambulatory Visit: Payer: Self-pay

## 2019-12-07 ENCOUNTER — Encounter: Payer: Self-pay | Admitting: Cardiology

## 2019-12-07 ENCOUNTER — Telehealth: Payer: Self-pay | Admitting: Radiology

## 2019-12-07 VITALS — BP 120/80 | HR 110

## 2019-12-07 DIAGNOSIS — R072 Precordial pain: Secondary | ICD-10-CM | POA: Diagnosis not present

## 2019-12-07 DIAGNOSIS — R931 Abnormal findings on diagnostic imaging of heart and coronary circulation: Secondary | ICD-10-CM

## 2019-12-07 DIAGNOSIS — E7800 Pure hypercholesterolemia, unspecified: Secondary | ICD-10-CM

## 2019-12-07 DIAGNOSIS — R002 Palpitations: Secondary | ICD-10-CM | POA: Diagnosis not present

## 2019-12-07 DIAGNOSIS — R079 Chest pain, unspecified: Secondary | ICD-10-CM | POA: Diagnosis not present

## 2019-12-07 DIAGNOSIS — I10 Essential (primary) hypertension: Secondary | ICD-10-CM | POA: Diagnosis not present

## 2019-12-07 MED ORDER — METOPROLOL TARTRATE 100 MG PO TABS: 100.0000 mg | ORAL_TABLET | Freq: Once | ORAL | 0 refills | Status: DC

## 2019-12-07 NOTE — Patient Instructions (Addendum)
Medication Instructions:  Your physician recommends that you continue on your current medications as directed. Please refer to the Current Medication list given to you today. *If you need a refill on your cardiac medications before your next appointment, please call your pharmacy*  Testing/Procedures: Your physician has requested that you have an echocardiogram. Echocardiography is a painless test that uses sound waves to create images of your heart. It provides your doctor with information about the size and shape of your heart and how well your heart's chambers and valves are working. This procedure takes approximately one hour. There are no restrictions for this procedure.  Your physician has recommended that you wear an event monitor. Event monitors are medical devices that record the heart's electrical activity. Doctors most often [place] these monitors to diagnose arrhythmias. Arrhythmias are problems with the speed or rhythm of the heartbeat. The monitor is a small, portable device. You can wear one while you do your normal daily activities. This is usually used to diagnose what is causing palpitations/syncope (passing out). The monitor will be mailed to you.  Your physician has recommended that you have a coronary CTA scan. Please see next page for further instructions.    Follow-Up: At [facility], you and your health needs are our priority.  As part of our continuing mission to provide you with exceptional heart care, we have created designated Provider Care Teams.  These Care Teams include your primary Cardiologist (physician) and Advanced Practice Providers (APPs -  Physician Assistants and Nurse Practitioners) who all work together to provide you with the care you need, when you need it.  Follow up with Dr. Radford Pax as needed based on results of testing.    Other Instructions Your cardiac CT will be scheduled at:   [facility] [address] [place], [place]  [address] [phone]  Please arrive at the [facility] main entrance of [facility] 30 minutes prior to test start time. Proceed to the [facility] Radiology Department (first floor) to check-in and test prep.  Please follow these instructions carefully (unless otherwise directed):  Hold all erectile dysfunction medications at least 3 days (72 hrs) prior to test.  On the Night Before the Test: . Be sure to Drink plenty of water. . Do not consume any caffeinated/decaffeinated beverages or chocolate 12 hours prior to your test. . Do not take any antihistamines 12 hours prior to your test.  On the Day of the Test: . Drink plenty of water. Do not drink any water within one hour of the test. . Do not eat any food 4 hours prior to the test. . You may take your regular medications prior to the test.  . Take metoprolol (Lopressor) two hours prior to test. . HOLD Hydrochlorothiazide morning of the test      After the Test: . Drink plenty of water. . After receiving IV contrast, you may experience a mild flushed feeling. This is normal. . On occasion, you may experience a mild rash up to 24 hours after the test. This is not dangerous. If this occurs, you can take Benadryl 25 mg and increase your fluid intake. . If you experience trouble breathing, this can be serious. If it is severe call 911 IMMEDIATELY. If it is mild, please call our office. . If you take any of these medications: Glipizide/Metformin, Avandament, Glucavance, please do not take 48 hours after completing test unless otherwise instructed.   Once we have confirmed authorization from your insurance  company, we will call you to set up a date and time for your test. Based on how quickly your insurance processes prior authorizations requests, please allow up to 4 weeks to be contacted for scheduling your Cardiac CT appointment. Be advised that routine Cardiac CT appointments could be scheduled as many as 8 weeks after your  provider has ordered it.  For non-scheduling related questions, please contact the cardiac imaging nurse navigator should you have any questions/concerns: Marchia Bond, Cardiac Imaging Nurse Navigator Burley Saver, Interim Cardiac Imaging Nurse [place] [facility] V[facility] Services Direct Office Dial: [phone]   For scheduling needs, including cancellations and rescheduling, please call [place], [phone] (temporary number).

## 2019-12-07 NOTE — Progress Notes (Addendum)
Cardiology Office Note    Date:  12/07/2019   ID:  Anthony [name], DOB 03-07-1970, MRN [id number]  PCP:  Danna Hefty, DO  Cardiologist:  Fransico Him, MD   Chief Complaint  Patient presents with  . New Patient (Initial Visit)    Chest pain, HLD, palpitations, HTN    History of Present Illness:  Anthony [name] is a 49 y.o. male who is being seen today for the evaluation of chest pain at the request of Caccavale, Sophia, PA-C.  This is a 49yo male with a hx of spina bifida wheelchair bound and chronic indwelling catheter who recently presented to [facility] ER with complaints of chest pain.  He had been in his US[employer] sitting at his computer working and suddenly he developed chest pain was sudden in onset and tight and pressure on the left side of his chest with no radiation and lasted about 12 hours and spontaneously resolved. There was  associated diaphoresis, nausea and SOB.   Since then he has not had any further CP.  He has noticed occasional palpitations that he has noticed sporadically and had some last night.  He also has noticed that he notices his heart rate racing with minimal exertion recently which is new for him.    He has a strong fm hx of CAD.  He has a remote hx of smoking but quit about 10 years ago.   In ER ddimer was elevated and a chest CTA showed no PE and no coronary Ca.  hsTrop was normal at 9 and < 2.  He has a hx of HLD with LDL 134 and HDL 25 in Sept 2021.  He is here today for evaluation. Since his ER visit he denies any chest pain or pressure, SOB, DOE, PND, orthopnea, LE edema, dizziness, or syncope. He continues to have sporadic palpitations. He is compliant with his meds and is tolerating meds with no SE.    Past Medical History:  Diagnosis Date  . Chronic indwelling Foley catheter   . Spina bifida   . Wheelchair bound     Past Surgical History:  Procedure Laterality Date  . BACK SURGERY    . HIP SURGERY    . INCISION AND DRAINAGE OF WOUND N/A  01/24/2017   Procedure: IRRIGATION AND DEBRIDEMENT WOUND- BUTTOCK ABSCESS;  Surgeon: Stark Klein, MD;  Location: [facility] ORS;  Service: General;  Laterality: N/A;    Current Medications: Current Meds  Medication Sig  . Catheters (FOLEY CATHETER 2-WAY) MISC Use as directed by urologist.  . lisinopril-hydrochlorothiazide (ZESTORETIC) 20-25 MG tablet TAKE 1 TABLET BY MOUTH ONCE DAILY. TAKE 1/2 (ONE-HALF) AT BEDTIME  . Multiple Vitamin (MULTIVITAMIN WITH MINERALS) TABS tablet Take 1 tablet by mouth daily.    Allergies:   Vancomycin   Social History   Socioeconomic History  . Marital status: Single    Spouse name: Not on file  . Number of children: Not on file  . Years of education: Not on file  . Highest education level: Not on file  Occupational History  . Not on file  Tobacco Use  . Smoking status: Never Smoker  . Smokeless tobacco: Never Used  Vaping Use  . Vaping Use: Never used  Substance and Sexual Activity  . Alcohol use: No  . Drug use: Not on file  . Sexual activity: Not on file  Other Topics Concern  . Not on file  Social History Narrative  . Not on file   Social  Determinants of Health   Financial Resource Strain:   . Difficulty of Paying Living Expenses: Not on file  Food Insecurity:   . Worried About Charity fundraiser in the Last Year: Not on file  . Ran Out of Food in the Last Year: Not on file  Transportation Needs:   . Lack of Transportation (Medical): Not on file  . Lack of Transportation (Non-Medical): Not on file  Physical Activity:   . Days of Exercise per Week: Not on file  . Minutes of Exercise per Session: Not on file  Stress:   . Feeling of Stress : Not on file  Social Connections:   . Frequency of Communication with Friends and Family: Not on file  . Frequency of Social Gatherings with Friends and Family: Not on file  . Attends Religious Services: Not on file  . Active Member of Clubs or Organizations: Not on file  . Attends Theatre manager Meetings: Not on file  . Marital Status: Not on file     Family History:  The patient's family history includes CAD in his father and maternal grandfather; Sudden Cardiac Death in his sister.   ROS:   Please see the history of present illness.    ROS All other systems reviewed and are negative.  No flowsheet data found.   PHYSICAL EXAM:   VS:  BP 120/80   Pulse (!) 110   SpO2 96%    GEN: Well nourished, well developed, in no acute distress  HEENT: normal  Neck: no JVD, carotid bruits, or masses Cardiac: regular and tachy; no murmurs, rubs, or gallops,no edema.  Intact distal pulses bilaterally.  Respiratory:  clear to auscultation bilaterally, normal work of breathing GI: soft, nontender, nondistended, + BS MS: no deformity or atrophy  Skin: warm and dry, no rash Neuro:  Alert and Oriented x 3, Strength and sensation are intact Psych: euthymic mood, full affect  Wt Readings from Last 3 Encounters:  11/22/19 165 lb (74.8 kg)  11/28/17 175 lb (79.4 kg)  03/27/17 177 lb (80.3 kg)      Studies/Labs Reviewed:   EKG:  EKG is ordered today and showed ST at 112bpm with PVCs   Recent Labs: 11/22/2019: ALT 18; BUN 18; Creatinine, Ser 0.60; Hemoglobin 12.4; Platelets 438; Potassium 2.8; Sodium 133   Lipid Panel    Component Value Date/Time   CHOL 189 09/23/2019 0858   TRIG 165 (H) 09/23/2019 0858   HDL 25 (L) 09/23/2019 0858   CHOLHDL 7.6 (H) 09/23/2019 0858   CHOLHDL 7.0 02/06/2014 1449   VLDL 43 (H) 02/06/2014 1449   LDLCALC 134 (H) 09/23/2019 0858     Additional studies/ records that were reviewed today include:  Hospital ER notes    ASSESSMENT:    1. Chest pain of uncertain etiology   2. Pure hypercholesterolemia   3. Palpitations   4. Essential hypertension      PLAN:  In order of problems listed above:  1.  Chest pain -workup in ER with EKG, chest CTA and Trop was normal despite 24 hours of chest pain.   -EKG is nonischemic -he does  have a fm hx of premature heart disease and also has HLD -I will get a coronary CTA to define coronary anatomy -check 2D echo to assess LVF  2.  HLD -LDL goal < 100 and < 70 if found to have CAD on coronary CTA -he was prescribed zocor but has not picked it up yet  3.  Palpitations -these are sporadic -will get 30 day event monitor to assess  4.  HTN -BP controlled -continue Lisinopril HCT 20-$RemoveBeforeDEI'25mg'[id number]$  qam and 1/2 tab qpm    Medication Adjustments/Labs and Tests Ordered: Current medicines are reviewed at length with the patient today.  Concerns regarding medicines are outlined above.  Medication changes, Labs and Tests ordered today are listed in the Patient Instructions below.  Patient Instructions  Medication Instructions:  Your physician recommends that you continue on your current medications as directed. Please refer to the Current Medication list given to you today. *If you need a refill on your cardiac medications before your next appointment, please call your pharmacy*  Testing/Procedures: Your physician has requested that you have an echocardiogram. Echocardiography is a painless test that uses sound waves to create images of your heart. It provides your doctor with information about the size and shape of your heart and how well your heart's chambers and valves are working. This procedure takes approximately one hour. There are no restrictions for this procedure.  Your physician has recommended that you wear an event monitor. Event monitors are medical devices that record the heart's electrical activity. Doctors most often [place] these monitors to diagnose arrhythmias. Arrhythmias are problems with the speed or rhythm of the heartbeat. The monitor is a small, portable device. You can wear one while you do your normal daily activities. This is usually used to diagnose what is causing palpitations/syncope (passing out). The monitor will be mailed to you.  Your physician has recommended  that you have a coronary CTA scan. Please see next page for further instructions.    Follow-Up: At [facility], you and your health needs are our priority.  As part of our continuing mission to provide you with exceptional heart care, we have created designated Provider Care Teams.  These Care Teams include your primary Cardiologist (physician) and Advanced Practice Providers (APPs -  Physician Assistants and Nurse Practitioners) who all work together to provide you with the care you need, when you need it.  Follow up with Dr. Radford Pax as needed based on results of testing.    Other Instructions Your cardiac CT will be scheduled at:   [facility] [address] [place], [place] [address] [phone]  Please arrive at the [facility] main entrance of [facility] 30 minutes prior to test start time. Proceed to the [facility] Radiology Department (first floor) to check-in and test prep.  Please follow these instructions carefully (unless otherwise directed):  Hold all erectile dysfunction medications at least 3 days (72 hrs) prior to test.  On the Night Before the Test: . Be sure to Drink plenty of water. . Do not consume any caffeinated/decaffeinated beverages or chocolate 12 hours prior to your test. . Do not take any antihistamines 12 hours prior to your test.  On the Day of the Test: . Drink plenty of water. Do not drink any water within one hour of the test. . Do not eat any food 4 hours prior to the test. . You may take your regular medications prior to the test.  . Take metoprolol (Lopressor) two hours prior to test. . HOLD Hydrochlorothiazide morning of the test      After the Test: . Drink plenty of water. . After receiving IV contrast, you may experience a mild flushed feeling. This is normal. . On occasion, you may experience a mild rash up to 24 hours after the test. This is not  dangerous. If this occurs, you can take Benadryl 25 mg and  increase your fluid intake. . If you experience trouble breathing, this can be serious. If it is severe call 911 IMMEDIATELY. If it is mild, please call our office. . If you take any of these medications: Glipizide/Metformin, Avandament, Glucavance, please do not take 48 hours after completing test unless otherwise instructed.   Once we have confirmed authorization from your insurance company, we will call you to set up a date and time for your test. Based on how quickly your insurance processes prior authorizations requests, please allow up to 4 weeks to be contacted for scheduling your Cardiac CT appointment. Be advised that routine Cardiac CT appointments could be scheduled as many as 8 weeks after your provider has ordered it.  For non-scheduling related questions, please contact the cardiac imaging nurse navigator should you have any questions/concerns: Marchia Bond, Cardiac Imaging Nurse Navigator Burley Saver, Interim Cardiac Imaging Nurse [place] and Vascular Services Direct Office Dial: [phone]   For scheduling needs, including cancellations and rescheduling, please call [place], [phone] (temporary number).        Signed, Fransico Him, MD  12/07/2019 1:32 PM    [facility] [place], [place], [place]  [address] Phone: [phone]; Fax: [phone]

## 2019-12-07 NOTE — Telephone Encounter (Signed)
Enrolled patient for a 30 day [employer] Event Monitor to be mailed to patients home  

## 2019-12-14 ENCOUNTER — Ambulatory Visit (INDEPENDENT_AMBULATORY_CARE_PROVIDER_SITE_OTHER): Payer: Medicare Other

## 2019-12-14 DIAGNOSIS — R002 Palpitations: Secondary | ICD-10-CM

## 2019-12-23 ENCOUNTER — Telehealth (HOSPITAL_COMMUNITY): Payer: Self-pay | Admitting: Emergency Medicine

## 2019-12-23 NOTE — Telephone Encounter (Signed)
Attempted to call patient regarding upcoming cardiac CT appointment. °Left message on voicemail with name and callback number °[name] RN Navigator Cardiac Imaging °[place] [facility] °[phone] Office °[phone] Cell ° °

## 2019-12-23 NOTE — Telephone Encounter (Signed)
Pt returning phone call regarding upcoming cardiac imaging study; pt verbalizes understanding of appt date/time, parking situation and where to check in, pre-test NPO status and medications ordered, and verified current allergies; name and call back number provided for further questions should they arise Anthony Alexandria RN Navigator Cardiac Imaging [name] [phone] office [phone] cell   Pt verbalized understanding to take PO metoprolol 2 hr prior to scan.  Pt is wheelchair bound and will need assistance onto the scanner table. Denies shoulder mobility for ideal scanning position. Anthony [name]

## 2019-12-26 ENCOUNTER — Encounter (HOSPITAL_COMMUNITY): Payer: Self-pay

## 2019-12-26 ENCOUNTER — Other Ambulatory Visit: Payer: Self-pay

## 2019-12-26 ENCOUNTER — Ambulatory Visit (HOSPITAL_COMMUNITY)
Admission: RE | Admit: 2019-12-26 | Discharge: 2019-12-26 | Disposition: A | Discharge status: Home / Self Care | Payer: Medicare Other | Source: Ambulatory Visit | Attending: Cardiology | Admitting: Cardiology

## 2019-12-26 DIAGNOSIS — R072 Precordial pain: Secondary | ICD-10-CM | POA: Diagnosis not present

## 2019-12-26 MED ORDER — METOPROLOL TARTRATE 5 MG/5ML IV SOLN
INTRAVENOUS | Status: AC
  Filled 2019-12-26: qty 15

## 2019-12-26 MED ORDER — DILTIAZEM HCL 25 MG/5ML IV SOLN
INTRAVENOUS | Status: AC
  Filled 2019-12-26: qty 5

## 2019-12-26 MED ORDER — METOPROLOL TARTRATE 5 MG/5ML IV SOLN
5.0000 mg | Freq: Once | INTRAVENOUS | Status: AC
  Administered 2019-12-26: 5 mg via INTRAVENOUS

## 2019-12-27 ENCOUNTER — Telehealth: Payer: Self-pay

## 2019-12-27 DIAGNOSIS — R072 Precordial pain: Secondary | ICD-10-CM

## 2019-12-27 NOTE — Telephone Encounter (Signed)
Spoke with the patient and advised him on testing that Dr. Mayford Knife has ordered since he could not complete coronary CTA scan. Patient verbalized understanding and all questions were answered.

## 2019-12-27 NOTE — Telephone Encounter (Signed)
-----  Message from Sueanne Margarita, MD sent at 12/27/2019  9:28 AM EST ----- I will not repeat coronary CTA  Lilyona Richner please order a Leane Call and coronary Ca score  Traci ----- Message ----- From: Zeb Comfort, RN Sent: 12/27/2019   8:15 AM EST To: Sueanne Margarita, MD, Lorenza Evangelist, RN, #  Adding more information to this. His HR was in the 90's when he came in.  They gave IV medication but it wouldn't budge.   Merle ----- Message ----- From: Lorenza Evangelist, RN Sent: 12/27/2019   8:12 AM EST To: Sueanne Margarita, MD, Junie Bame, RN, #  Hey I see this guy wasn't scanned but there is no documentation to why he wasn't scanned. Do you want [place] to attempt to r/s? Clarise Cruz

## 2019-12-27 NOTE — Telephone Encounter (Signed)
I have spoken to patient regarding the risks of the stress test including MI, CVA, arrhythmia or death.  Patient understands and agrees with proceeding.

## 2020-01-03 ENCOUNTER — Other Ambulatory Visit: Payer: Self-pay

## 2020-01-03 ENCOUNTER — Ambulatory Visit (HOSPITAL_COMMUNITY): Payer: Medicare Other | Attending: Cardiology

## 2020-01-03 DIAGNOSIS — R079 Chest pain, unspecified: Secondary | ICD-10-CM | POA: Insufficient documentation

## 2020-01-03 LAB — ECHOCARDIOGRAM COMPLETE
Area-P 1/2: 3.08 cm2
S' Lateral: 3.2 cm

## 2020-01-03 NOTE — Addendum Note (Signed)
Addended by: Theresia Majors on: 01/03/2020 12:08 PM   Modules accepted: Orders

## 2020-01-04 ENCOUNTER — Other Ambulatory Visit: Payer: Self-pay

## 2020-01-04 ENCOUNTER — Encounter: Payer: Self-pay | Admitting: Family Medicine

## 2020-01-04 ENCOUNTER — Telehealth: Payer: Self-pay

## 2020-01-04 DIAGNOSIS — R0789 Other chest pain: Secondary | ICD-10-CM | POA: Insufficient documentation

## 2020-01-04 DIAGNOSIS — R072 Precordial pain: Secondary | ICD-10-CM

## 2020-01-04 NOTE — Progress Notes (Signed)
Please sig the Attestation order for Myoview. Thanks

## 2020-01-04 NOTE — Telephone Encounter (Signed)
Spoke with the patient, detailed instructions given. He stated that he understood and would be here for his test. Asked to call back with any questions. S.Salahuddin Arismendez EMTP 

## 2020-01-05 ENCOUNTER — Other Ambulatory Visit: Payer: Self-pay

## 2020-01-05 ENCOUNTER — Ambulatory Visit (HOSPITAL_COMMUNITY): Payer: Medicare Other | Attending: Cardiovascular Disease

## 2020-01-05 DIAGNOSIS — R072 Precordial pain: Secondary | ICD-10-CM | POA: Insufficient documentation

## 2020-01-05 LAB — MYOCARDIAL PERFUSION IMAGING
LV dias vol: 86 mL (ref 62–150)
LV sys vol: 31 mL
Peak HR: 123 {beats}/min
Rest HR: 96 {beats}/min
SDS: 0
SRS: 0
SSS: 0
TID: 0.86

## 2020-01-05 MED ORDER — TECHNETIUM TC 99M TETROFOSMIN IV KIT
10.1000 | PACK | Freq: Once | INTRAVENOUS | Status: AC | PRN
  Administered 2020-01-05: 10.1 via INTRAVENOUS
  Filled 2020-01-05: qty 11

## 2020-01-05 MED ORDER — TECHNETIUM TC 99M TETROFOSMIN IV KIT
30.2000 | PACK | Freq: Once | INTRAVENOUS | Status: AC | PRN
  Administered 2020-01-05: 30.2 via INTRAVENOUS
  Filled 2020-01-05: qty 31

## 2020-01-05 MED ORDER — REGADENOSON 0.4 MG/5ML IV SOLN
0.4000 mg | Freq: Once | INTRAVENOUS | Status: AC
  Administered 2020-01-05: 0.4 mg via INTRAVENOUS

## 2020-01-16 ENCOUNTER — Other Ambulatory Visit: Payer: Medicare Other

## 2020-01-19 ENCOUNTER — Telehealth: Payer: Self-pay

## 2020-01-19 DIAGNOSIS — I4729 Other ventricular tachycardia: Secondary | ICD-10-CM

## 2020-01-19 MED ORDER — METOPROLOL SUCCINATE ER 25 MG PO TB24: 25.0000 mg | ORAL_TABLET | Freq: Every day | ORAL | 3 refills | Status: DC

## 2020-01-19 NOTE — Telephone Encounter (Signed)
-----   Message from Quintella Reichert, MD sent at 01/19/2020  9:16 AM EST ----- Heart monitor showed occasional extra heart beats from the bottom of the heart.  Nuclear stress test was normal and echo showed low normal LVF.  Please get a cardiac MRI to rule out RV dysplasia due to NSVT.  Add Toprol XL 25mg  daily to suppress ventricular ectopy and followup with PA in 4 weeks

## 2020-01-19 NOTE — Telephone Encounter (Signed)
The patient has been notified of the result and verbalized understanding.  All questions (if any) were answered. Theresia Majors, RN 01/19/2020 12:58 PM  Patient will start on Toprol 25 mg daily.  Orders for MRI have been placed.  4 week FU has been scheduled.

## 2020-01-23 ENCOUNTER — Telehealth: Payer: Self-pay | Admitting: Cardiology

## 2020-01-23 NOTE — Telephone Encounter (Signed)
Spoke with patient regarding preferred weekdays and times for the Cardiac MRI ordered by Dr. Clovia Cuff patient as soon as we hear from the insurance  prior authorization, I will be in touch with his appointment.  He voiced his understanding.

## 2020-01-26 ENCOUNTER — Encounter: Payer: Self-pay | Admitting: Cardiology

## 2020-01-26 ENCOUNTER — Telehealth: Payer: Self-pay | Admitting: Cardiology

## 2020-01-26 NOTE — Telephone Encounter (Signed)
Left message for patient regarding scheduled appointment 02/20/20 at 8:00 am for the Cardiac MRI ordered bu Dr. Mayford Knife.  Arrival time is 7:30am--ist floor admissions office.  Will mail information to patient and it is also available in My Chart.  Requested patient call with questions or concerns.

## 2020-01-30 ENCOUNTER — Encounter: Payer: Self-pay | Admitting: Cardiology

## 2020-01-30 ENCOUNTER — Telehealth: Payer: Self-pay | Admitting: Cardiology

## 2020-01-30 NOTE — Telephone Encounter (Signed)
Left message for patient regarding scheduled appointment Tuesday 03/06/20 at 12:00am for the Cardiac MRI ordered by Dr. Mayford Knife.  Arrival time is 11:30 am---1st floor admissions office----will mail information to patient and it is also in My Chart.  Asked patient to call with questions or concerns.Anthony [name]

## 2020-02-20 ENCOUNTER — Other Ambulatory Visit (HOSPITAL_COMMUNITY): Payer: Medicare Other

## 2020-02-22 ENCOUNTER — Ambulatory Visit: Payer: Medicare Other | Admitting: Cardiology

## 2020-02-23 ENCOUNTER — Telehealth: Payer: Self-pay | Admitting: Cardiology

## 2020-02-23 NOTE — Telephone Encounter (Signed)
Pt c/o medication issue:  1. Name of Medication:  lisinopril-hydrochlorothiazide (ZESTORETIC) 20-25 MG tablet metoprolol succinate (TOPROL XL) 25 MG 24 hr tablet  2. How are you currently taking this medication (dosage and times per day)?   3. Are you having a reaction (difficulty breathing--STAT)?   4. What is your medication issue? Patient wanted to know if he is still supposed to be taking both of these medications. He said he is waiting at the pharmacy to have them filled. Please advise

## 2020-02-23 NOTE — Telephone Encounter (Signed)
Spoke with the patient who states he is at the pharmacy to get lisinopril-HCTZ filled and they had a prescription for Toprol for him. He wanted to be sure he was supposed to be taking it. I advised him that Dr. Mayford Knife prescribed Toprol based on the results from his heart monitor. Patient states that he recalls that conversation and being told he needed to start on the medication but he was never notified that it was ready for him to pick up. Patient will go ahead and start on the Toprol. He has a follow-up visit with Dr. Mayford Knife 2/16.

## 2020-03-02 ENCOUNTER — Telehealth (HOSPITAL_COMMUNITY): Payer: Self-pay | Admitting: Emergency Medicine

## 2020-03-02 NOTE — Telephone Encounter (Signed)
Attempted to call patient regarding upcoming cardiac MR appointment. Left message on voicemail with name and callback number Ladd Cen RN Navigator Cardiac Imaging [place] [facility] [phone] Office [phone] Cell  

## 2020-03-06 ENCOUNTER — Ambulatory Visit (HOSPITAL_COMMUNITY): Admission: RE | Admit: 2020-03-06 | Payer: Medicare Other | Source: Ambulatory Visit

## 2020-03-08 ENCOUNTER — Telehealth: Payer: Self-pay | Admitting: Family Medicine

## 2020-03-08 ENCOUNTER — Encounter: Payer: Self-pay | Admitting: Cardiology

## 2020-03-08 ENCOUNTER — Other Ambulatory Visit: Payer: Self-pay

## 2020-03-08 ENCOUNTER — Ambulatory Visit (INDEPENDENT_AMBULATORY_CARE_PROVIDER_SITE_OTHER): Payer: Medicare Other | Admitting: Cardiology

## 2020-03-08 VITALS — BP 130/82 | HR 95 | Ht 68.0 in | Wt 170.0 lb

## 2020-03-08 DIAGNOSIS — I493 Ventricular premature depolarization: Secondary | ICD-10-CM | POA: Insufficient documentation

## 2020-03-08 DIAGNOSIS — I10 Essential (primary) hypertension: Secondary | ICD-10-CM

## 2020-03-08 DIAGNOSIS — E7800 Pure hypercholesterolemia, unspecified: Secondary | ICD-10-CM | POA: Diagnosis not present

## 2020-03-08 DIAGNOSIS — I472 Ventricular tachycardia: Secondary | ICD-10-CM

## 2020-03-08 DIAGNOSIS — I4729 Other ventricular tachycardia: Secondary | ICD-10-CM

## 2020-03-08 NOTE — Patient Instructions (Addendum)
Medication Instructions:  Your physician recommends that you continue on your current medications as directed. Please refer to the Current Medication list given to you today.  *If you need a refill on your cardiac medications before your next appointment, please call your pharmacy*  Lab Work: Fasting lipids and ALT on 03/24 between 7:30am and 4:30pm If you have labs (blood work) drawn today and your tests are completely normal, you will receive your results only by: Marland Kitchen MyChart Message (if you have MyChart) OR . A paper copy in the mail If you have any lab test that is abnormal or we need to change your treatment, we will call you to review the results.  Follow-Up: At [facility], you and your health needs are our priority.  As part of our continuing mission to provide you with exceptional heart care, we have created designated Provider Care Teams.  These Care Teams include your primary Cardiologist (physician) and Advanced Practice Providers (APPs -  Physician Assistants and Nurse Practitioners) who all work together to provide you with the care you need, when you need it.  Your next appointment:   6 month(s)  The format for your next appointment:   In Person  Provider:   You may see [name], MD or one of the following Advanced Practice Providers on your designated Care Team:    Ronie Spies, PA-C  Jacolyn Reedy, PA-C

## 2020-03-08 NOTE — Addendum Note (Signed)
Addended by: Theresia Majors on: 03/08/2020 03:27 PM   Modules accepted: Orders

## 2020-03-08 NOTE — Progress Notes (Signed)
Cardiology Office Note    Date:  03/08/2020   ID:  Anthony [name], DOB [birth year], MRN [id number]  PCP:  Joana Reamer, DO  Cardiologist:  [name], MD   Chief Complaint  Patient presents with  . Follow-up    Chest pain, HTN, HLD, NSVT    History of Present Illness:  Anthony [name] is a 50 y.o. male  with a hx of spina bifida wheelchair bound and chronic indwelling catheter who presented last fall to [facility] ER with complaints of chest pain.  He had been in his US[employer] sitting at his computer working and suddenly he developed chest pain was sudden in onset and tight and pressure on the left side of his chest with no radiation and lasted about 12 hours and spontaneously resolved. There was  associated diaphoresis, nausea and SOB.   He has noticed occasional palpitations that he has noticed sporadically as well.    He has a strong fm hx of CAD.  He has a remote hx of smoking but quit about 10 years ago.   In ER ddimer was elevated and a chest CTA showed no PE and no coronary Ca.  hsTrop was normal at 9 and < 2.  He has a hx of HLD with LDL 134 and HDL 25 in Sept 2021.  He underwrent nuclear stress test that showed no ischemia. 2D echo showed no ischemia.  Event monitor showed a 5 beat run of WCT and occasional PVCs.  A cardiac MRI was ordered but not completed.  He was started on Toprol XL 25mg  daily  He is here today for followup and is doing well.  He tells me that the palpitations have significantly improved but does make him sleepy. He actually has been sleeping better on the BB.  He denies any chest pain or pressure, SOB, DOE, PND, orthopnea, LE edema, dizziness or syncope. He is compliant with his meds and is tolerating meds with no SE.    Past Medical History:  Diagnosis Date  . Chronic indwelling Foley catheter   . PVCs (premature ventricular contractions)   . Spina bifida   . Wheelchair bound     Past Surgical History:  Procedure Laterality Date  . BACK SURGERY    . HIP  SURGERY    . INCISION AND DRAINAGE OF WOUND N/A 01/24/2017   Procedure: IRRIGATION AND DEBRIDEMENT WOUND- BUTTOCK ABSCESS;  Surgeon: 03/24/2017, MD;  Location: [facility] ORS;  Service: General;  Laterality: N/A;    Current Medications: Current Meds  Medication Sig  . Catheters (FOLEY CATHETER 2-WAY) MISC Use as directed by urologist.  . lisinopril-hydrochlorothiazide (ZESTORETIC) 20-25 MG tablet TAKE 1 TABLET BY MOUTH ONCE DAILY. TAKE 1/2 (ONE-HALF) AT BEDTIME  . metoprolol succinate (TOPROL XL) 25 MG 24 hr tablet Take 1 tablet (25 mg total) by mouth daily.  . Multiple Vitamin (MULTIVITAMIN WITH MINERALS) TABS tablet Take 1 tablet by mouth daily.  . simvastatin (ZOCOR) 40 MG tablet Take 40 mg by mouth daily.    Allergies:   Vancomycin   Social History   Socioeconomic History  . Marital status: Single    Spouse name: Not on file  . Number of children: Not on file  . Years of education: Not on file  . Highest education level: Not on file  Occupational History  . Not on file  Tobacco Use  . Smoking status: Never Smoker  . Smokeless tobacco: Never Used  Vaping Use  . Vaping Use: Never  used  Substance and Sexual Activity  . Alcohol use: No  . Drug use: Not on file  . Sexual activity: Not on file  Other Topics Concern  . Not on file  Social History Narrative  . Not on file   Social Determinants of Health   Financial Resource Strain: Not on file  Food Insecurity: Not on file  Transportation Needs: Not on file  Physical Activity: Not on file  Stress: Not on file  Social Connections: Not on file     Family History:  The patient's family history includes CAD in his father and maternal grandfather; Sudden Cardiac Death in his sister.   ROS:   Please see the history of present illness.    ROS All other systems reviewed and are negative.  No flowsheet data found.   PHYSICAL EXAM:   VS:  BP 130/82   Pulse 95   Ht 5\' 8"  (1.727 m) Comment: per patient wheelchair bound  Wt  170 lb (77.1 kg) Comment: per patient wheelchair bound  SpO2 98%   BMI 25.85 kg/m     GEN: Well nourished, well developed in no acute distress HEENT: Normal NECK: No JVD; No carotid bruits LYMPHATICS: No lymphadenopathy CARDIAC:RRR, no murmurs, rubs, gallops RESPIRATORY:  Clear to auscultation without rales, wheezing or rhonchi  ABDOMEN: Soft, non-tender, non-distended MUSCULOSKELETAL:  No edema; No deformity  SKIN: Warm and dry NEUROLOGIC:  Alert and oriented x 3 PSYCHIATRIC:  Normal affect    Wt Readings from Last 3 Encounters:  03/08/20 170 lb (77.1 kg)  11/22/19 165 lb (74.8 kg)  11/28/17 175 lb (79.4 kg)      Studies/Labs Reviewed:   EKG:  EKG is not ordered today   13/09/19 12/2019 Study Highlights   Nuclear stress EF: 64%. The left ventricular ejection fraction is normal (55-65%).  There was no ST segment deviation noted during stress.  This is a low risk study. There is no evidence of ischemia or previous infarction .  The study is normal.  2D echo 12/2019 IMPRESSIONS   1. Left ventricular ejection fraction, by estimation, is 50 to 55%. The  left ventricle has low normal function. The left ventricle has no regional  wall motion abnormalities. Left ventricular diastolic parameters were  normal.  2. Right ventricular systolic function is normal. The right ventricular  size is normal.  3. The mitral valve is normal in structure. Trivial mitral valve  regurgitation. No evidence of mitral stenosis.  4. The aortic valve is tricuspid. Aortic valve regurgitation is not  visualized. No aortic stenosis is present.  5. The inferior vena cava is normal in size with greater than 50%  respiratory variability, suggesting right atrial pressure of 3 mmHg.   Event Monitor 12/2019 Study Highlights   Sinus Bradycardia, normal sinus rhythm and sinus tachycardia. The average heart rate was 95bpm and ranged from 50 to 135bpm.  1 run of wide complex  tachycardia for 5 beats consistent with nonsustained ventricular tachycardia.  Occasional PVCs  Recent Labs: 11/22/2019: ALT 18; BUN 18; Creatinine, Ser 0.60; Hemoglobin 12.4; Platelets 438; Potassium 2.8; Sodium 133   Lipid Panel    Component Value Date/Time   CHOL 189 09/23/2019 0858   TRIG 165 (H) 09/23/2019 0858   HDL 25 (L) 09/23/2019 0858   CHOLHDL 7.6 (H) 09/23/2019 0858   CHOLHDL 7.0 02/06/2014 1449   VLDL 43 (H) 02/06/2014 1449   LDLCALC 134 (H) 09/23/2019 0858     Additional studies/ records that were  reviewed today include:  Hospital ER notes    ASSESSMENT:    1. Chest pain of uncertain etiology   2. Pure hypercholesterolemia   3. Palpitations   4. Essential hypertension    PLAN:  In order of problems listed above:  1.  Chest pain -workup in ER with EKG, chest CTA and Trop was normal despite 24 hours of chest pain.   -EKG is nonischemic -chest pain has resolved -Lexiscan myoview showed no ischemia -2D echo was normal  2.  HLD -LDL goal < 100  -continue on Simvastatin 40mg  daily -check FLP and ALT in 5 weeks  3.  Palpitations/NSVT/PVCs -event monitor showed PVCs and NSVT -Toprol has improved palpitations -cMRI was ordered but has not been done>>being rescheduled -continue Toprol XL 25mg  daily  4.  HTN -BP is well controlled on exam -continue Lisinopril HCT 20-25mg  qam and 1/2 tab qpm and Toprol XL 25mg  daily  Medication Adjustments/Labs and Tests Ordered: Current medicines are reviewed at length with the patient today.  Concerns regarding medicines are outlined above.  Medication changes, Labs and Tests ordered today are listed in the Patient Instructions below.  There are no Patient Instructions on file for this visit.   Followup with me in 6 months  Signed, , MD  03/08/2020 3:14 PM    [facility] [address] [place], [place], [address]  [place] Phone: [phone]; Fax: [phone]

## 2020-03-08 NOTE — Telephone Encounter (Signed)
[employer] Parking Placard form dropped off for at front desk for completion.  Verified that patient section of form has been completed.  Last DOS/WCC with PCP was 09/23/19  Placed form in team folder to be completed by clinical staff.  Vilinda Blanks

## 2020-03-09 NOTE — Telephone Encounter (Signed)
Reviewed form and placed in PCP's box for completion.  .Lovenia Debruler R Yan Pankratz, CMA  

## 2020-03-15 ENCOUNTER — Telehealth: Payer: Self-pay | Admitting: Cardiology

## 2020-03-15 ENCOUNTER — Encounter: Payer: Self-pay | Admitting: Cardiology

## 2020-03-15 NOTE — Telephone Encounter (Signed)
Spoke with patient regarding 04/25/20 12:00pm Cardiac MRI appointment at [facility]---arrival time is 11:30 am--1st floor admissions office---will mail information to patient and it is available in My Chart.  Patient voiced his understanding.

## 2020-03-15 NOTE — Telephone Encounter (Signed)
Patient calls nurse line checking the status of form completion.

## 2020-03-15 NOTE — Telephone Encounter (Signed)
Forms have been completed and placed up front.

## 2020-03-16 NOTE — Telephone Encounter (Signed)
Patient contacted and advised of form ready for pick up. A copy was made for batch scanning.

## 2020-03-27 ENCOUNTER — Other Ambulatory Visit: Payer: Medicare Other

## 2020-04-12 ENCOUNTER — Other Ambulatory Visit: Payer: Self-pay

## 2020-04-12 ENCOUNTER — Other Ambulatory Visit: Payer: Medicare Other

## 2020-04-12 DIAGNOSIS — E7800 Pure hypercholesterolemia, unspecified: Secondary | ICD-10-CM | POA: Diagnosis not present

## 2020-04-13 LAB — LIPID PANEL
Chol/HDL Ratio: 5.4 ratio — ABNORMAL HIGH (ref 0.0–5.0)
Cholesterol, Total: 145 mg/dL (ref 100–199)
HDL: 27 mg/dL — ABNORMAL LOW (ref >39)
LDL Chol Calc (NIH): 76 mg/dL (ref 0–99)
Triglycerides: 252 mg/dL — ABNORMAL HIGH (ref 0–149)
VLDL Cholesterol Cal: 42 mg/dL — ABNORMAL HIGH (ref 5–40)

## 2020-04-13 LAB — ALT: ALT: 16 IU/L (ref 0–44)

## 2020-04-16 ENCOUNTER — Telehealth: Payer: Self-pay

## 2020-04-16 DIAGNOSIS — E7800 Pure hypercholesterolemia, unspecified: Secondary | ICD-10-CM

## 2020-04-16 NOTE — Telephone Encounter (Signed)
-----   Message from Olene Floss, RPH-CPP sent at 04/16/2020 12:26 PM EDT ----- LDL is at goal. We could discuss with him in lipid clinic about his diet and see if his insurance would pay for Vascepa.

## 2020-04-16 NOTE — Telephone Encounter (Signed)
The patient has been notified of the result and verbalized understanding.  All questions (if any) were answered. Theresia Majors, RN 04/16/2020 2:44 PM  Patient has been referred to lipid clinic.

## 2020-04-18 ENCOUNTER — Other Ambulatory Visit (HOSPITAL_COMMUNITY): Payer: Medicare Other

## 2020-04-19 ENCOUNTER — Other Ambulatory Visit: Payer: Medicare Other

## 2020-04-20 ENCOUNTER — Other Ambulatory Visit: Payer: Self-pay | Admitting: Family Medicine

## 2020-04-20 DIAGNOSIS — I10 Essential (primary) hypertension: Secondary | ICD-10-CM

## 2020-04-24 ENCOUNTER — Telehealth (HOSPITAL_COMMUNITY): Payer: Self-pay | Admitting: Emergency Medicine

## 2020-04-24 NOTE — Telephone Encounter (Signed)
Reaching out to patient to offer assistance regarding upcoming cardiac imaging study; pt verbalizes understanding of appt date/time, parking situation and where to check in, and verified current allergies; name and call back number provided for further questions should they arise [name] RN Navigator Cardiac Imaging [name] [phone] office [phone] cell   Pt denies metal implants, denies claustro Anthony [name]

## 2020-04-24 NOTE — Telephone Encounter (Signed)
Attempted to call patient regarding upcoming cardiac CT appointment. °Left message on voicemail with name and callback number °Alencia Gordon RN Navigator Cardiac Imaging °[facility] °[phone] Office °[phone] Cell ° °

## 2020-04-25 ENCOUNTER — Ambulatory Visit (HOSPITAL_COMMUNITY)
Admission: RE | Admit: 2020-04-25 | Discharge: 2020-04-25 | Disposition: A | Discharge status: Home / Self Care | Payer: Medicare Other | Source: Ambulatory Visit | Attending: Cardiology | Admitting: Cardiology

## 2020-04-25 ENCOUNTER — Other Ambulatory Visit: Payer: Self-pay

## 2020-04-25 DIAGNOSIS — I472 Ventricular tachycardia: Secondary | ICD-10-CM | POA: Diagnosis not present

## 2020-04-25 DIAGNOSIS — I4729 Other ventricular tachycardia: Secondary | ICD-10-CM

## 2020-04-25 MED ORDER — GADOBUTROL 1 MMOL/ML IV SOLN
8.0000 mL | Freq: Once | INTRAVENOUS | Status: AC | PRN
  Administered 2020-04-25: 8 mL via INTRAVENOUS

## 2020-05-21 ENCOUNTER — Other Ambulatory Visit: Payer: Self-pay

## 2020-05-21 ENCOUNTER — Ambulatory Visit (INDEPENDENT_AMBULATORY_CARE_PROVIDER_SITE_OTHER): Payer: Medicare Other | Admitting: Pharmacist

## 2020-05-21 VITALS — BP 118/76 | HR 89

## 2020-05-21 DIAGNOSIS — I10 Essential (primary) hypertension: Secondary | ICD-10-CM | POA: Diagnosis not present

## 2020-05-21 DIAGNOSIS — E7800 Pure hypercholesterolemia, unspecified: Secondary | ICD-10-CM

## 2020-05-21 MED ORDER — ROSUVASTATIN CALCIUM 20 MG PO TABS: 20.0000 mg | ORAL_TABLET | Freq: Every day | ORAL | 3 refills | Status: DC

## 2020-05-21 MED ORDER — CARVEDILOL 3.125 MG PO TABS: 3.1250 mg | ORAL_TABLET | Freq: Two times a day (BID) | ORAL | 3 refills | Status: DC

## 2020-05-21 MED ORDER — LISINOPRIL-HYDROCHLOROTHIAZIDE 20-25 MG PO TABS: 1.0000 | ORAL_TABLET | Freq: Every day | ORAL | 3 refills | Status: DC

## 2020-05-21 NOTE — Progress Notes (Signed)
Patient ID: Anthony [name]                 DOB: May 04, 1970                    MRN: [id number]     HPI: Anthony [name] is a 50 y.o. male patient referred to lipid clinic by Dr Radford Pax. PMH is significant for spina bifida (wheelchair bound), PVCs, HTN, former tobacco use, and chest pain. Workup in ER for chest pain showed normal EKG, chest CTA, and troponin. Nuclear stress test showed no ischemia. Strong family history of CAD. PCP started pt on simvastatin last fall due to 7.5% 10 year ASCVD risk. Cardiac MRI 04/25/20 was normal.  Pt presents today in good spirits. He has been noticing muscle aches in his arms since starting simvastatin. Has not previously tried any meds for his cholesterol but does take OTC fish oil. Is not sure if he was fasting for last lipid panel. States he would have fasted if he were asked to, labs were drawn ~3pm though. Does not have prescription insurance. Reviewed all meds/copays today to help minimize prescription drug costs as much as possible.  Current Medications: simvastatin 59m daily Risk Factors: family history of premature CAD LDL goal: <1023mmL  Diet: Breakfast - pop-tart or french toast (2 days/week), decaf coffee. Lunch/dinner - wraps with sandwich meat. Sometimes mac and cheese or eggplant spaghetti. Drinks water or herbal tea. Not too many snacks. Doesn't add salt to food. No alcohol. Limits red meat.  Family History: CAD in his father and maternal grandfather; Sudden Cardiac Death in his sister.   Social History: Former tobacco abuse, denies alcohol and illicit drug use.  Labs: 04/12/20: TC 145, TG 252, HDL 27, LDL 76 (simvastatin 4066maily) 09/23/19: TC 189, TG 165, HDL 25, LDL 134 (no lipid lowering therapy)  Past Medical History:  Diagnosis Date  . Chronic indwelling Foley catheter   . PVCs (premature ventricular contractions)   . Spina bifida   . Wheelchair bound     Current Outpatient Medications on File Prior to Visit  Medication Sig Dispense  Refill  . Catheters (FOLEY CATHETER 2-WAY) MISC Use as directed by urologist. 5 each 1  . lisinopril-hydrochlorothiazide (ZESTORETIC) 20-25 MG tablet TAKE 1 TABLET BY MOUTH ONCE DAILY, TAKE  1/2 TABLET AT BEDTIME 135 tablet 1  . metoprolol succinate (TOPROL XL) 25 MG 24 hr tablet Take 1 tablet (25 mg total) by mouth daily. 90 tablet 3  . Multiple Vitamin (MULTIVITAMIN WITH MINERALS) TABS tablet Take 1 tablet by mouth daily.    . simvastatin (ZOCOR) 40 MG tablet Take 40 mg by mouth daily.     No current facility-administered medications on file prior to visit.    Allergies  Allergen Reactions  . Vancomycin Itching    Assessment/Plan:  1. Hyperlipidemia - LDL at goal <100m61m given family history of premature CAD. TG elevated above goal <150, unsure if pt was fasting. Discussed diet today with pt including reducing carb/sugar intake to help with TG control, although overall diet is healthy. Since he has been experiencing muscle pain in his arms on simvastatin, will change pt to rosuvastatin 20mg41mly. Advised pt he can wait 1 week before starting rosuvastatin to allow for simvastatin washout/symptom improvement first. Provided pt with GoodRx coupon that will bring rosuvastatin to ~$15/3 month supply. He will continue on OTC fish oil 1g BID. Will recheck labs same day as next appt with Dr TurneRadford Pax  months.  2. Hypertension - BP at goal <130/6mHg. His BP meds have higher copays since XL formulation of metoprolol is not on [facility]'s $4/$10 list, and lisinopril-HCTZ is only covered as 1 tab/daily for $4/$10 pricing (he takes 1.5 tabs/day which increases his copay). Will decrease lisinopril-HCTZ 20-282mfrom 1.5 tabs to 1 tab daily. Will change Toprol 2522maily (taking for PVCs) to equivalent dose of carvedilol 3.125m57mD for cheaper copay and better BP lowering to offset the effects of reducing his lisinopril-HCTZ dose. Provided pt with BP cuff today in office, reviewed technique, and confirmed  BP cuff accuracy. Advised pt to monitor BP at home a few days a week.  I'll call pt in ~3 weeks to follow up with statin tolerability and BP readings.  Nithila Sumners E. Karee Forge, PharmD, BCACP, CPP [id number] [id number]: [phone]: ([phone]/2022 2:30 PM

## 2020-05-21 NOTE — Patient Instructions (Addendum)
It was nice to meet you today!  Your blood pressure goal is < 130/41mmHg -Change your lisinopril-HCTZ to 1 tablet once daily (slight dose decrease) - this will decrease your cost to $10/3 month supply -Stop taking metoprolol. Start taking carvedilol 1 tablet twice daily (space your doses apart about every 12 hours) - this will decrease your cost to $10/3 month supply -Monitor your blood pressure at home  Your LDL cholesterol goal is < 812 (yours is 76). Your triglyceride goal is < 150 (yours was 252 - likely elevated from not fasting, has been in the upper 180s in the past) -Stop taking simvastatin. Start taking rosuvastatin 1 tablet daily - this will be ~$16/3 month supply using a GoodRx discount coupon. Monitor to see if your muscle aches in your arms improve  I'll give you a call in about 3 weeks to see how you're feeling and how your blood pressure is doing. Call Aundra Millet, PharmD with any concerns before then #[phone]

## 2020-05-22 ENCOUNTER — Telehealth: Payer: Self-pay | Admitting: Pharmacist

## 2020-05-22 NOTE — Telephone Encounter (Signed)
Pt returned call, states he did not bring GoodRx card to pharmacy. Advised pt that the price he was quoted was the cash price for rosuvastatin and to bring in discount card that he was given yesterday. He will do so.

## 2020-05-22 NOTE — Telephone Encounter (Signed)
Pt left message stating that his pharmacy tried to charge him > $120 for his rosuvastatin. Returned call back to pt and left him a message asking if he gave them the GoodRx coupon that I gave pt at his visit yesterday. Pharmacy should have processed coupon to bring copay down to < $17 as long as pt brought it in.

## 2020-06-11 ENCOUNTER — Telehealth: Payer: Self-pay | Admitting: Pharmacist

## 2020-06-11 NOTE — Telephone Encounter (Signed)
Left message for pt to discuss statin tolerability and home BP readings.  -Simvastatin 40mg  changed to rosuvastatin 20mg  at visit 3 weeks ago since he was reporting muscle aches in his arms -Lisinopril-HCTZ 20-25mg  was decreased from 1.5 tabs to 1 tab daily for affordability, and Toprol 25mg  was changed to carvedilol 3.125mg  BID for affordability and more effect on BP lowering. Can increase carvedilol dose if additional BP lowering is needed.

## 2020-06-11 NOTE — Telephone Encounter (Signed)
Patient returning call.

## 2020-06-11 NOTE — Telephone Encounter (Signed)
Called pt again. Reports tolerating medications well. Thinks pain in his arms is a little bit better. BP readings have been well controlled right around 120/70. He is agreeable to continuing current meds.

## 2020-09-17 ENCOUNTER — Ambulatory Visit: Payer: Medicare Other | Admitting: Cardiology

## 2020-09-17 ENCOUNTER — Other Ambulatory Visit: Payer: Medicare Other

## 2020-12-24 ENCOUNTER — Ambulatory Visit: Payer: Medicare Other | Admitting: Cardiology

## 2020-12-24 ENCOUNTER — Other Ambulatory Visit: Payer: Medicare Other

## 2021-01-31 ENCOUNTER — Telehealth: Payer: Self-pay | Admitting: Cardiology

## 2021-01-31 MED ORDER — METOPROLOL SUCCINATE ER 25 MG PO TB24: 25.0000 mg | ORAL_TABLET | Freq: Every day | ORAL | 3 refills | Status: DC

## 2021-01-31 NOTE — Telephone Encounter (Signed)
Spoke with the patient who states that he thinks carvedilol is making him gain weight. He reports that he has noticed weight gain since he started the medication and nothing else has changed. He states that it is making it more difficult for him to transfer from his chair. He states that he would prefer to go back on metoprolol succinate. He does not have any blood pressure readings to provide me. No other symptoms or concerns.

## 2021-01-31 NOTE — Telephone Encounter (Signed)
Spoke with the patient and advised him that Dr. Mayford Knife was okay with him switching back to Toprol. Rx has been sent in. Patient verbalized understanding.

## 2021-01-31 NOTE — Telephone Encounter (Signed)
Pt c/o medication issue:  1. Name of Medication: carvedilol (COREG) 3.125 MG tablet  2. How are you currently taking this medication (dosage and times per day)? As directed  3. Are you having a reaction (difficulty breathing--STAT)? Weight gain  4. What is your medication issue? Patient states that he thinks this medication is causing him to gain weight and wants to know if he can go back on Metoprolol Succinate

## 2021-02-04 ENCOUNTER — Ambulatory Visit: Payer: Medicare Other | Admitting: Cardiology

## 2021-02-04 ENCOUNTER — Other Ambulatory Visit: Payer: Medicare Other

## 2021-03-26 ENCOUNTER — Ambulatory Visit (INDEPENDENT_AMBULATORY_CARE_PROVIDER_SITE_OTHER): Payer: Medicare Other | Admitting: Cardiology

## 2021-03-26 ENCOUNTER — Encounter: Payer: Self-pay | Admitting: Cardiology

## 2021-03-26 ENCOUNTER — Other Ambulatory Visit: Payer: Self-pay

## 2021-03-26 ENCOUNTER — Other Ambulatory Visit: Payer: Medicare Other | Admitting: *Deleted

## 2021-03-26 ENCOUNTER — Ambulatory Visit (INDEPENDENT_AMBULATORY_CARE_PROVIDER_SITE_OTHER): Payer: Medicare Other

## 2021-03-26 VITALS — BP 146/88 | HR 91 | Ht 68.0 in | Wt 175.0 lb

## 2021-03-26 DIAGNOSIS — E7800 Pure hypercholesterolemia, unspecified: Secondary | ICD-10-CM

## 2021-03-26 DIAGNOSIS — R079 Chest pain, unspecified: Secondary | ICD-10-CM

## 2021-03-26 DIAGNOSIS — I10 Essential (primary) hypertension: Secondary | ICD-10-CM

## 2021-03-26 DIAGNOSIS — I493 Ventricular premature depolarization: Secondary | ICD-10-CM

## 2021-03-26 MED ORDER — METOPROLOL SUCCINATE ER 25 MG PO TB24: 25.0000 mg | ORAL_TABLET | Freq: Two times a day (BID) | ORAL | 3 refills | Status: DC

## 2021-03-26 NOTE — Progress Notes (Signed)
? ?Cardiology Office Note   ? ?Date:  03/26/2021  ? ?ID:  [name], DOB September 16, 1970, MRN [id number] ? ?PCP:  Precious Gilding, DO  ?Cardiologist:  Fransico Him, MD  ? ?Chief Complaint  ?Patient presents with  ? Chest Pain  ? Hypertension  ? Hyperlipidemia  ? ? ?History of Present Illness:  ?Anthony [name] is a 51 y.o. male  with a hx of spina bifida wheelchair bound and chronic indwelling catheter who initially presented with chest pain.  He has a strong fm hx of CAD.  He has a remote hx of smoking but quit about 10 years ago.   Chest CTA showed no PE and no coronary Ca. He has a hx of HLD with LDL 134 and HDL 25 in Sept 2021.  Nuclear stress test showed no ischemia and 2D echo was normal.  Event monitor showed a 5 beat run of WCT and occasional PVCs.  A cardiac MRI showed normal LV size and wall thickness with EF 54%, normal RV size and systolic function EF [id number] and no LGE to suggest scar.  He was started on Toprol XL 25mg  daily ? ?He is here today for followup and is doing well.  He denies any chest pain or pressure, SOB, DOE (except with extreme exertion), PND, orthopnea or syncope. He has chronic pedal edema that has actually improved recently.  He tells me that in the past year he has been having some problems with palpitations at night.  He says that rarely he will wake himself up snoring.  He has never awakened gasping for breath. He is compliant with his meds and is tolerating meds with no SE.    ? ?Past Medical History:  ?Diagnosis Date  ? Chronic indwelling Foley catheter   ? PVCs (premature ventricular contractions)   ? Spina bifida   ? Wheelchair bound   ? ? ?Past Surgical History:  ?Procedure Laterality Date  ? BACK SURGERY    ? HIP SURGERY    ? INCISION AND DRAINAGE OF WOUND N/A 01/24/2017  ? Procedure: IRRIGATION AND DEBRIDEMENT WOUND- BUTTOCK ABSCESS;  Surgeon: Stark Klein, MD;  Location: [facility];  Service: General;  Laterality: N/A;  ? ? ?Current Medications: ?Current Meds  ?Medication Sig  ?  Catheters (FOLEY CATHETER 2-WAY) MISC Use as directed by urologist.  ? lisinopril-hydrochlorothiazide (ZESTORETIC) 20-25 MG tablet Take 1 tablet by mouth daily.  ? metoprolol succinate (TOPROL XL) 25 MG 24 hr tablet Take 1 tablet (25 mg total) by mouth daily.  ? Multiple Vitamin (MULTIVITAMIN WITH MINERALS) TABS tablet Take 1 tablet by mouth daily.  ? Omega-3 Fatty Acids (FISH OIL) 1000 MG CAPS Take 1 capsule by mouth 2 (two) times daily.  ? Potassium 99 MG TABS Take 1 tablet by mouth 2 (two) times daily.  ? rosuvastatin (CRESTOR) 20 MG tablet Take 1 tablet (20 mg total) by mouth daily.  ? ? ?Allergies:   Vancomycin  ? ?Social History  ? ?Socioeconomic History  ? Marital status: Single  ?  Spouse name: Not on file  ? Number of children: Not on file  ? Years of education: Not on file  ? Highest education level: Not on file  ?Occupational History  ? Not on file  ?Tobacco Use  ? Smoking status: Never  ? Smokeless tobacco: Never  ?Vaping Use  ? Vaping Use: Never used  ?Substance and Sexual Activity  ? Alcohol use: No  ? Drug use: Not on file  ? Sexual activity:  Not on file  ?Other Topics Concern  ? Not on file  ?Social History Narrative  ? Not on file  ? ?Social Determinants of Health  ? ?Financial Resource Strain: Not on file  ?Food Insecurity: Not on file  ?Transportation Needs: Not on file  ?Physical Activity: Not on file  ?Stress: Not on file  ?Social Connections: Not on file  ?  ? ?Family History:  The patient's family history includes CAD in his father and maternal grandfather; Sudden Cardiac Death in his sister.  ? ?ROS:   ?Please see the history of present illness.    ?ROS All other systems reviewed and are negative. ? ?No flowsheet data found. ? ? ?PHYSICAL EXAM:   ?VS:  BP (!) 146/88   Pulse 91   Ht 5\' 8"  (1.727 m)   Wt 175 lb (79.4 kg)   SpO2 97%   BMI 26.61 kg/m?    ? ?GEN: Well nourished, well developed in no acute distress ?HEENT: Normal ?NECK: No JVD; No carotid bruits ?LYMPHATICS: No  lymphadenopathy ?CARDIAC:RRR, no murmurs, rubs, gallops with occasional ectopy ?RESPIRATORY:  Clear to auscultation without rales, wheezing or rhonchi  ?ABDOMEN: Soft, non-tender, non-distended ?MUSCULOSKELETAL:  No edema; No deformity  ?SKIN: Warm and dry ?NEUROLOGIC:  Alert and oriented x 3 ?PSYCHIATRIC:  Normal affect   ?Wt Readings from Last 3 Encounters:  ?03/26/21 175 lb (79.4 kg)  ?03/08/20 170 lb (77.1 kg)  ?11/22/19 165 lb (74.8 kg)  ?  ? ? ?Studies/Labs Reviewed:  ? ?EKG:  EKG is ordered today and demonstrates NSR with trigeminal PVCs ? ?Leane Call 12/2019 ?Study Highlights ? ?Nuclear stress EF: 64%. The left ventricular ejection fraction is normal (55-65%). ?There was no ST segment deviation noted during stress. ?This is a low risk study. There is no evidence of ischemia or previous infarction . ?The study is normal. ? ?2D echo 12/2019 ?IMPRESSIONS  ? ? 1. Left ventricular ejection fraction, by estimation, is 50 to 55%. The  ?left ventricle has low normal function. The left ventricle has no regional  ?wall motion abnormalities. Left ventricular diastolic parameters were  ?normal.  ? 2. Right ventricular systolic function is normal. The right ventricular  ?size is normal.  ? 3. The mitral valve is normal in structure. Trivial mitral valve  ?regurgitation. No evidence of mitral stenosis.  ? 4. The aortic valve is tricuspid. Aortic valve regurgitation is not  ?visualized. No aortic stenosis is present.  ? 5. The inferior vena cava is normal in size with greater than 50%  ?respiratory variability, suggesting right atrial pressure of 3 mmHg.  ? ?Event Monitor 12/2019 ?Study Highlights ? ?Sinus Bradycardia, normal sinus rhythm and sinus tachycardia. The average heart rate was 95bpm and ranged from 50 to 135bpm. ?1 run of wide complex tachycardia for 5 beats consistent with nonsustained ventricular tachycardia. ?Occasional PVCs ? ?Recent Labs: ?04/12/2020: ALT 16  ? ?Lipid Panel ?   ?Component Value  Date/Time  ? CHOL 145 04/12/2020 1532  ? TRIG 252 (H) 04/12/2020 1532  ? HDL 27 (L) 04/12/2020 1532  ? CHOLHDL 5.4 (H) 04/12/2020 1532  ? CHOLHDL 7.0 02/06/2014 1449  ? VLDL 43 (H) 02/06/2014 1449  ? [place] 76 04/12/2020 1532  ? ? ? ?Additional studies/ records that were reviewed today include: labs from PCP ? ? ? ?ASSESSMENT:   ? ?1. Chest pain of uncertain etiology   ?2. Pure hypercholesterolemia   ?3. Palpitations   ?4. Essential hypertension   ? ?PLAN:  ?In  order of problems listed above: ? ?1.  Chest pain ?-Lexiscan myoview showed no ischemia ?-2D echo was normal ?-No coronary artery calcifications on chest CTA ?-He has not had any further episodes of chest pain ? ?2.  HLD ?-LDL goal < 100  ?-Check FLP and ALT ?-Continue prescription drug management with simvastatin 40 mg daily with as needed refills ? ?3.  Palpitations/NSVT/PVCs ?-event monitor showed PVCs and NSVT ?-He has had improvement in his palpitations after starting Toprol but now is having nocturnal palpitations and has trigeminal PVCs on EKG.  He has no sx of sleep apnea ?-cMRI showed normal LV and RV size and function with no LGE to indicate scar ?-Increase Toprol XL to 25mg  BID to try to suppress PVCs especially at night ?-I will get a 2 week ziopatch to assess PVC load ?-check TSH, mag and BMET ? ?4.  HTN ?-He is borderline controlled today on exam but says it is 130/70's at home ?-Continue prescription drug management lisinopril HCT 20-25 mg every morning  ?-increase Toprol XL to 25mg  BID ? ?Medication Adjustments/Labs and Tests Ordered: ?Current medicines are reviewed at length with the patient today.  Concerns regarding medicines are outlined above.  Medication changes, Labs and Tests ordered today are listed in the Patient Instructions below. ? ?There are no Patient Instructions on file for this visit. ? ? ?Followup with me in 6 months ? ?Signed, ?Fransico Him, MD  ?03/26/2021 1:33 PM    ?[facility] ?[address],  [place], [place]  [address] ?Phone: [phone]; Fax: [phone]  ? ?

## 2021-03-26 NOTE — Progress Notes (Unsigned)
Enrolled for [employer] to mail a ZIO XT long term holter monitor to the patients address on file.  

## 2021-03-26 NOTE — Addendum Note (Signed)
Addended by: Theresia Majors on: 03/26/2021 01:48 PM ? ? Modules accepted: Orders ? ?

## 2021-03-26 NOTE — Patient Instructions (Signed)
Medication Instructions:  ?Your physician has recommended you make the following change in your medication:  ?1) INCREASE Toprol XL (metoprolol succinate) to 25 mg twice daily  ? ?*If you need a refill on your cardiac medications before your next appointment, please call your pharmacy* ? ?Lab Work: ?TODAY: BMET, Mg, and TSH  ?If you have labs (blood work) drawn today and your tests are completely normal, you will receive your results only by: ?MyChart Message (if you have MyChart) OR ?A paper copy in the mail ?If you have any lab test that is abnormal or we need to change your treatment, we will call you to review the results. ? ? ?Testing/Procedures: ?Your physician has recommended that you wear an event monitor. Event monitors are medical devices that record the heart?s electrical activity. Doctors most often [place] these monitors to diagnose arrhythmias. Arrhythmias are problems with the speed or rhythm of the heartbeat. The monitor is a small, portable device. You can wear one while you do your normal daily activities. This is usually used to diagnose what is causing palpitations/syncope (passing out). ? ?Follow-Up: ?At [facility], you and your health needs are our priority.  As part of our continuing mission to provide you with exceptional heart care, we have created designated Provider Care Teams.  These Care Teams include your primary Cardiologist (physician) and Advanced Practice Providers (APPs -  Physician Assistants and Nurse Practitioners) who all work together to provide you with the care you need, when you need it. ? ?Your next appointment:   ?6 week(s) ? ?The format for your next appointment:   ?In Person ? ?Provider:   ?Robbie Lis, PA-C, Nicholes Rough, PA-C, Melina Copa, PA-C, Ermalinda Barrios, PA-C, Christen Bame, NP, or Richardson Dopp, PA-C     Then, Fransico Him, MD will plan to see you again in 6 month(s).  ? ? ?

## 2021-03-27 LAB — COMPREHENSIVE METABOLIC PANEL
ALT: 24 IU/L (ref 0–44)
AST: 26 IU/L (ref 0–40)
Albumin/Globulin Ratio: 1.2 (ref 1.2–2.2)
Albumin: 4.3 g/dL (ref 3.8–4.9)
Alkaline Phosphatase: 104 IU/L (ref 44–121)
BUN/Creatinine Ratio: 12 (ref 9–20)
BUN: 7 mg/dL (ref 6–24)
Bilirubin Total: 0.5 mg/dL (ref 0.0–1.2)
CO2: 27 mmol/L (ref 20–29)
Calcium: 9.9 mg/dL (ref 8.7–10.2)
Chloride: 100 mmol/L (ref 96–106)
Creatinine, Ser: 0.58 mg/dL — ABNORMAL LOW (ref 0.76–1.27)
Globulin, Total: 3.7 g/dL (ref 1.5–4.5)
Glucose: 97 mg/dL (ref 70–99)
Potassium: 3.6 mmol/L (ref 3.5–5.2)
Sodium: 141 mmol/L (ref 134–144)
Total Protein: 8 g/dL (ref 6.0–8.5)
eGFR: 118 mL/min/{1.73_m2} (ref >59)

## 2021-03-27 LAB — LIPID PANEL
Chol/HDL Ratio: 5.5 ratio — ABNORMAL HIGH (ref 0.0–5.0)
Cholesterol, Total: 143 mg/dL (ref 100–199)
HDL: 26 mg/dL — ABNORMAL LOW (ref >39)
LDL Chol Calc (NIH): 80 mg/dL (ref 0–99)
Triglycerides: 218 mg/dL — ABNORMAL HIGH (ref 0–149)
VLDL Cholesterol Cal: 37 mg/dL (ref 5–40)

## 2021-03-29 ENCOUNTER — Telehealth: Payer: Self-pay

## 2021-03-29 MED ORDER — ROSUVASTATIN CALCIUM 40 MG PO TABS: 40.0000 mg | ORAL_TABLET | Freq: Every day | ORAL | 3 refills | Status: DC

## 2021-03-29 NOTE — Telephone Encounter (Signed)
The patient has been notified of the result and verbalized understanding.  All questions (if any) were answered. ?Antonieta Iba, RN 03/29/2021 2:14 PM  ?Patient is tolerating rosuvastatin. He is agreeable to increase the dose to 40 mg daily ?

## 2021-03-29 NOTE — Telephone Encounter (Signed)
-----   Message from Awilda Metro, RPH-CPP sent at 03/29/2021  9:21 AM EST ----- ?LDL is at goal < 100 for primary prevention, TG mildly elevated but improved from last check. Would encourage pt to limit intake of carbs and sugar in his diet. If he's tolerating rosuvastatin 20mg  daily, can also increase to 40mg  daily. Wouldn't add on new medication with mild elevation though. ?

## 2021-04-01 DIAGNOSIS — I493 Ventricular premature depolarization: Secondary | ICD-10-CM

## 2021-04-01 DIAGNOSIS — E7800 Pure hypercholesterolemia, unspecified: Secondary | ICD-10-CM | POA: Diagnosis not present

## 2021-04-01 DIAGNOSIS — R079 Chest pain, unspecified: Secondary | ICD-10-CM

## 2021-04-01 DIAGNOSIS — I10 Essential (primary) hypertension: Secondary | ICD-10-CM

## 2021-04-25 DIAGNOSIS — I493 Ventricular premature depolarization: Secondary | ICD-10-CM | POA: Diagnosis not present

## 2021-04-25 DIAGNOSIS — E7800 Pure hypercholesterolemia, unspecified: Secondary | ICD-10-CM | POA: Diagnosis not present

## 2021-04-25 DIAGNOSIS — R079 Chest pain, unspecified: Secondary | ICD-10-CM | POA: Diagnosis not present

## 2021-04-25 DIAGNOSIS — I10 Essential (primary) hypertension: Secondary | ICD-10-CM | POA: Diagnosis not present

## 2021-04-26 ENCOUNTER — Telehealth: Payer: Self-pay

## 2021-04-26 DIAGNOSIS — I493 Ventricular premature depolarization: Secondary | ICD-10-CM

## 2021-04-26 MED ORDER — METOPROLOL SUCCINATE ER 50 MG PO TB24: 50.0000 mg | ORAL_TABLET | Freq: Two times a day (BID) | ORAL | 3 refills | Status: DC

## 2021-04-26 NOTE — Telephone Encounter (Signed)
The patient has been notified of the result and verbalized understanding.  All questions (if any) were answered. ?Anthony Burows, RN 04/26/2021 4:00 PM   ?Advised pt to take Toprol -XL 50 mg PO BID. Also advised of EP referral for frequent PVC's scheduler will call to set up appointment.  ?

## 2021-04-26 NOTE — Telephone Encounter (Signed)
-----   Message from Quintella Reichert, MD sent at 04/26/2021 10:16 AM EDT ----- ?Please let patient know that he is still having a lot of PVCs.  Increase his Toprol-XL to 50 mg twice daily and referred to Dr. Lalla Brothers or Dr. Elberta Fortis. ?

## 2021-04-29 ENCOUNTER — Other Ambulatory Visit: Payer: Self-pay

## 2021-04-29 MED ORDER — ROSUVASTATIN CALCIUM 40 MG PO TABS
40.0000 mg | ORAL_TABLET | Freq: Every day | ORAL | 3 refills | Status: DC
Start: 2021-04-29 — End: 2022-05-01

## 2021-04-29 NOTE — Telephone Encounter (Signed)
Pt's medication was sent to pt's pharmacy as requested. Confirmation received.  °

## 2021-05-03 ENCOUNTER — Emergency Department (HOSPITAL_COMMUNITY)
Admission: EM | Admit: 2021-05-03 | Discharge: 2021-05-03 | Disposition: A | Discharge status: Home / Self Care | Payer: Medicare Other | Attending: Emergency Medicine | Admitting: Emergency Medicine

## 2021-05-03 ENCOUNTER — Emergency Department (HOSPITAL_COMMUNITY): Payer: Medicare Other

## 2021-05-03 ENCOUNTER — Encounter (HOSPITAL_COMMUNITY): Payer: Self-pay

## 2021-05-03 DIAGNOSIS — L89159 Pressure ulcer of sacral region, unspecified stage: Secondary | ICD-10-CM | POA: Diagnosis not present

## 2021-05-03 DIAGNOSIS — L89229 Pressure ulcer of left hip, unspecified stage: Secondary | ICD-10-CM | POA: Diagnosis not present

## 2021-05-03 DIAGNOSIS — R590 Localized enlarged lymph nodes: Secondary | ICD-10-CM | POA: Insufficient documentation

## 2021-05-03 DIAGNOSIS — N210 Calculus in bladder: Secondary | ICD-10-CM | POA: Diagnosis not present

## 2021-05-03 DIAGNOSIS — Z5189 Encounter for other specified aftercare: Secondary | ICD-10-CM

## 2021-05-03 DIAGNOSIS — Z4800 Encounter for change or removal of nonsurgical wound dressing: Secondary | ICD-10-CM | POA: Diagnosis present

## 2021-05-03 DIAGNOSIS — L98499 Non-pressure chronic ulcer of skin of other sites with unspecified severity: Secondary | ICD-10-CM | POA: Diagnosis not present

## 2021-05-03 DIAGNOSIS — L8990 Pressure ulcer of unspecified site, unspecified stage: Secondary | ICD-10-CM

## 2021-05-03 DIAGNOSIS — R000 Tachycardia, unspecified: Secondary | ICD-10-CM | POA: Diagnosis not present

## 2021-05-03 DIAGNOSIS — A419 Sepsis, unspecified organism: Secondary | ICD-10-CM | POA: Diagnosis not present

## 2021-05-03 LAB — CBC WITH DIFFERENTIAL/PLATELET
Abs Immature Granulocytes: 0.04 10*3/uL (ref 0.00–0.07)
Basophils Absolute: 0.1 10*3/uL (ref 0.0–0.1)
Basophils Relative: 1 %
Eosinophils Absolute: 0.3 10*3/uL (ref 0.0–0.5)
Eosinophils Relative: 3 %
HCT: 38.2 % — ABNORMAL LOW (ref 39.0–52.0)
Hemoglobin: 12.6 g/dL — ABNORMAL LOW (ref 13.0–17.0)
Immature Granulocytes: 1 %
Lymphocytes Relative: 16 %
Lymphs Abs: 1.3 10*3/uL (ref 0.7–4.0)
MCH: 28 pg (ref 26.0–34.0)
MCHC: 33 g/dL (ref 30.0–36.0)
MCV: 84.9 fL (ref 80.0–100.0)
Monocytes Absolute: 0.9 10*3/uL (ref 0.1–1.0)
Monocytes Relative: 11 %
Neutro Abs: 5.5 10*3/uL (ref 1.7–7.7)
Neutrophils Relative %: 68 %
Platelets: 465 10*3/uL — ABNORMAL HIGH (ref 150–400)
RBC: 4.5 MIL/uL (ref 4.22–5.81)
RDW: 13.7 % (ref 11.5–15.5)
WBC: 8.1 10*3/uL (ref 4.0–10.5)
nRBC: 0 % (ref 0.0–0.2)

## 2021-05-03 LAB — COMPREHENSIVE METABOLIC PANEL
ALT: 20 U/L (ref 0–44)
AST: 21 U/L (ref 15–41)
Albumin: 3.2 g/dL — ABNORMAL LOW (ref 3.5–5.0)
Alkaline Phosphatase: 67 U/L (ref 38–126)
Anion gap: 8 (ref 5–15)
BUN: 10 mg/dL (ref 6–20)
CO2: 30 mmol/L (ref 22–32)
Calcium: 9.3 mg/dL (ref 8.9–10.3)
Chloride: 105 mmol/L (ref 98–111)
Creatinine, Ser: 0.61 mg/dL (ref 0.61–1.24)
GFR, Estimated: >60 mL/min (ref >60)
Glucose, Bld: 123 mg/dL — ABNORMAL HIGH (ref 70–99)
Potassium: 3.2 mmol/L — ABNORMAL LOW (ref 3.5–5.1)
Sodium: 143 mmol/L (ref 135–145)
Total Bilirubin: 0.5 mg/dL (ref 0.3–1.2)
Total Protein: 7.6 g/dL (ref 6.5–8.1)

## 2021-05-03 LAB — URINALYSIS, ROUTINE W REFLEX MICROSCOPIC
Bilirubin Urine: NEGATIVE
Glucose, UA: NEGATIVE mg/dL
Hgb urine dipstick: NEGATIVE
Ketones, ur: NEGATIVE mg/dL
Nitrite: POSITIVE — AB
Protein, ur: 30 mg/dL — AB
Specific Gravity, Urine: 1.018 (ref 1.005–1.030)
WBC, UA: >50 WBC/hpf — ABNORMAL HIGH (ref 0–5)
pH: 9 — ABNORMAL HIGH (ref 5.0–8.0)

## 2021-05-03 LAB — PROTIME-INR
INR: 1.1 (ref 0.8–1.2)
Prothrombin Time: 14.5 seconds (ref 11.4–15.2)

## 2021-05-03 LAB — LACTIC ACID, PLASMA
Lactic Acid, Venous: 1.6 mmol/L (ref 0.5–1.9)
Lactic Acid, Venous: 1.5 mmol/L (ref 0.5–1.9)

## 2021-05-03 MED ORDER — SODIUM CHLORIDE 0.9 % IV BOLUS
1000.0000 mL | Freq: Once | INTRAVENOUS | Status: AC
  Administered 2021-05-03: 1000 mL via INTRAVENOUS

## 2021-05-03 MED ORDER — DOXYCYCLINE HYCLATE 100 MG PO CAPS: 100.0000 mg | ORAL_CAPSULE | Freq: Two times a day (BID) | ORAL | 0 refills | Status: DC

## 2021-05-03 MED ORDER — IOHEXOL 300 MG/ML  SOLN
100.0000 mL | Freq: Once | INTRAMUSCULAR | Status: AC | PRN
  Administered 2021-05-03: 100 mL via INTRAVENOUS

## 2021-05-03 NOTE — ED Provider Notes (Addendum)
[facility] EMERGENCY DEPARTMENT Provider Note   CSN: [id number] Arrival date & time: 05/03/21  1142     History  Chief Complaint  Patient presents with   Wound Check    Anthony [name] is a 51 y.o. male.  Presenting to the emergency room with concern for wound check.  Patient states that over the past week he has noted some drainage from one of his wounds in his bottom.  States that he noticed chills, feeling feverish at home.  States that he was not feeling well a couple days ago but actually over the last couple days has been feeling somewhat better.  Wanted to get wound evaluated.  Does not currently see a wound care provider.  States that he has had the sacral decubitus wounds for quite some time.  History of spina bifida.  HPI     Home Medications Prior to Admission medications   Medication Sig Start Date End Date Taking? Authorizing Provider  doxycycline (VIBRAMYCIN) 100 MG capsule Take 1 capsule (100 mg total) by mouth 2 (two) times daily. 05/03/21  Yes Milagros Loll, MD  Catheters (FOLEY CATHETER 2-WAY) MISC Use as directed by urologist. 12/30/12   Leona Singleton, MD  lisinopril-hydrochlorothiazide (ZESTORETIC) 20-25 MG tablet Take 1 tablet by mouth daily. 05/21/20   Quintella Reichert, MD  metoprolol succinate (TOPROL-XL) 50 MG 24 hr tablet Take 1 tablet (50 mg total) by mouth 2 (two) times daily. Take with or immediately following a meal. 04/26/21   Turner, Cornelious Bryant, MD  Multiple Vitamin (MULTIVITAMIN WITH MINERALS) TABS tablet Take 1 tablet by mouth daily.    [provider]  Omega-3 Fatty Acids (FISH OIL) 1000 MG CAPS Take 1 capsule by mouth 2 (two) times daily.    [provider]  Potassium 99 MG TABS Take 1 tablet by mouth 2 (two) times daily.    [provider]  rosuvastatin (CRESTOR) 40 MG tablet Take 1 tablet (40 mg total) by mouth daily. 04/29/21   Quintella Reichert, MD      Allergies    Vancomycin    Review of  Systems   Review of Systems  Constitutional:  Negative for chills and fever.  HENT:  Negative for ear pain and sore throat.   Eyes:  Negative for pain and visual disturbance.  Respiratory:  Negative for cough and shortness of breath.   Cardiovascular:  Negative for chest pain and palpitations.  Gastrointestinal:  Negative for abdominal pain and vomiting.  Genitourinary:  Negative for dysuria and hematuria.  Musculoskeletal:  Negative for arthralgias and back pain.  Skin:  Positive for color change and wound. Negative for rash.  Neurological:  Negative for seizures and syncope.  All other systems reviewed and are negative.  Physical Exam Updated Vital Signs BP 132/79   Pulse 97   Temp 98.7 F (37.1 C) (Oral)   Resp 18   Ht 5\' 8"  (1.727 m)   Wt 80 kg   SpO2 98%   BMI 26.82 kg/m  Physical Exam Vitals and nursing note reviewed.  Constitutional:      General: He is not in acute distress.    Appearance: He is well-developed.  HENT:     Head: Normocephalic and atraumatic.  Eyes:     Conjunctiva/sclera: Conjunctivae normal.  Cardiovascular:     Rate and Rhythm: Normal rate and regular rhythm.     Heart sounds: No murmur heard. Pulmonary:     Effort: Pulmonary effort  is normal. No respiratory distress.     Breath sounds: Normal breath sounds.  Abdominal:     Palpations: Abdomen is soft.     Tenderness: There is no abdominal tenderness.  Musculoskeletal:        General: No swelling.     Cervical back: Neck supple.  Skin:    General: Skin is warm and dry.     Capillary Refill: Capillary refill takes less than 2 seconds.     Comments: There is a moderate to large decubitus wound just to the left of patient's gluteal cleft, there is an additional moderate-sized wound just lateral to the other wound.  There is no active drainage noted, there is no surrounding erythema, no fluctuance  Neurological:     Mental Status: He is alert.  Psychiatric:        Mood and Affect: Mood  normal.    ED Results / Procedures / Treatments   Labs (all labs ordered are listed, but only abnormal results are displayed) Labs Reviewed  COMPREHENSIVE METABOLIC PANEL - Abnormal; Notable for the following components:      Result Value   Potassium 3.2 (*)    Glucose, Bld 123 (*)    Albumin 3.2 (*)    All other components within normal limits  CBC WITH DIFFERENTIAL/PLATELET - Abnormal; Notable for the following components:   Hemoglobin 12.6 (*)    HCT 38.2 (*)    Platelets 465 (*)    All other components within normal limits  URINALYSIS, ROUTINE W REFLEX MICROSCOPIC - Abnormal; Notable for the following components:   APPearance TURBID (*)    pH 9.0 (*)    Protein, ur 30 (*)    Nitrite POSITIVE (*)    Leukocytes,Ua SMALL (*)    WBC, UA >50 (*)    Bacteria, UA MANY (*)    All other components within normal limits  CULTURE, BLOOD (ROUTINE X 2)  CULTURE, BLOOD (ROUTINE X 2)  LACTIC ACID, PLASMA  LACTIC ACID, PLASMA  PROTIME-INR    EKG EKG Interpretation  Date/Time:  Friday May 03 2021 12:25:29 EDT Ventricular Rate:  111 PR Interval:  136 QRS Duration: 88 QT Interval:  340 QTC Calculation: 462 R Axis:   -28 Text Interpretation: Sinus tachycardia Minimal voltage criteria for LVH, may be normal variant ( R in aVL ) Inferior-posterior infarct , age undetermined Anterolateral infarct , age undetermined Abnormal ECG When compared with ECG of 22-Nov-2019 17:24, PREVIOUS ECG IS PRESENT Confirmed by Marianna Fuss ([id number]) on 05/03/2021 5:01:59 PM  Radiology DG Chest 2 View  Result Date: 05/03/2021 CLINICAL DATA:  Sepsis EXAM: CHEST - 2 VIEW COMPARISON:  11/22/2019 FINDINGS: The heart size and mediastinal contours are within normal limits. Both lungs are clear. Disc degenerative disease of the thoracic spine. IMPRESSION: No acute abnormality of the lungs. Electronically Signed   By: Jearld Lesch M.D.   On: 05/03/2021 13:05   CT PELVIS W CONTRAST  Result Date:  05/03/2021 CLINICAL DATA:  Evaluate for perianal abscess or fistula. EXAM: CT PELVIS WITH CONTRAST TECHNIQUE: Multidetector CT imaging of the pelvis was performed using the standard protocol following the bolus administration of intravenous contrast. RADIATION DOSE REDUCTION: This exam was performed according to the departmental dose-optimization program which includes automated exposure control, adjustment of the mA and/or kV according to patient size and/or use of iterative reconstruction technique. CONTRAST:  OMNIPAQUE IOHEXOL 300 MG/ML  SOLN COMPARISON:  06/23/2017. FINDINGS: Urinary Tract: The urinary bladder is collapsed around a  Foley catheter balloon. There are 3 large stones within the decompressed bladder measuring up to 2.6 cm. Bowel: The visualized bowel loops are nondilated. No wall thickening or inflammation identified. Vascular/Lymphatic: Aortic atherosclerosis. No aneurysm identified. Prominent bilateral retroperitoneal and pelvic lymph nodes are identified. Index aortocaval node measures 1 cm, image 12/3. Right external iliac lymph node measures 1.2 cm, image 36/3. Borderline enlarged bilateral inguinal lymph nodes are also noted including right inguinal node measuring 1.3 cm, image 51/3. Reproductive:  No mass or other significant abnormality Other: Large area of soft tissue ulceration is identified superficial to the posterior left acetabulum and extending along the left ischium and left inferior pubic rami. The soft tissue ulceration extends into the base of penis and appears unchanged when compared with 01/23/2017. Previously noted right ischial soft tissue ulceration has healed since the previous exam. No discrete fluid collection identified to suggest drainable abscess. Musculoskeletal: Signs of chronic osteomyelitis is noted involving bilateral inferior pubic rami, unchanged from previous imaging. Chronic bilateral hip dysplasia with extensive heterotopic bone formation is identified.  Signs of prior right hip Girdlestone procedure noted. IMPRESSION: 1. Large area of chronic, decubitus soft tissue ulceration superficial to the posterior left acetabulum and extending along the left ischium and left inferior pubic rami. The soft tissue ulceration extends into the base of penis and appears unchanged when compared with previous imaging. Within the limitations of unenhanced technique, no discrete fluid collection identified to suggest drainable abscess. 2. Signs of chronic osteomyelitis involving bilateral inferior pubic rami. 3. Chronic bilateral hip dysplasia with extensive heterotopic bone formation. 4. Multiple large bladder stones. 5. Borderline enlarged bilateral retroperitoneal and pelvic lymph nodes, favor reactive adenopathy. 6. Aortic Atherosclerosis (ICD10-I70.0). Electronically Signed   By: Signa Kell M.D.   On: 05/03/2021 19:05    Procedures Procedures    Medications Ordered in ED Medications  sodium chloride 0.9 % bolus 1,000 mL (0 mLs Intravenous Stopped 05/03/21 1845)  iohexol (OMNIPAQUE) 300 MG/ML solution 100 mL (100 mLs Intravenous Contrast Given 05/03/21 1833)    ED Course/ Medical Decision Making/ A&P                           Medical Decision Making Amount and/or Complexity of Data Reviewed Radiology: ordered.  Risk Prescription drug management.   51 year old gentleman presenting to the emergency room with concern for wound evaluation.  Patient has notable history of spina bifida, wheelchair-bound.  Examined patient's wounds.  The larger open wound on his bottom appears very stable, no sort of erythema or drainage noted.  The slightly smaller wound just lateral has a very faint amount of surrounding erythema and this is the one that patient had endorsed drainage.  Currently do not appreciate any large amount of drainage on exam.  Due to the history, check CT scan, labs.  Lab work reviewed, no leukocytosis, no lactic acidosis.  CT demonstrated large area of  chronic decubitus soft tissue ulceration.  No discrete fluid collection identified to suggest drainable abscess.  Findings of chronic osteomyelitis involving bilateral inferior pubic rami.  I reviewed report from 2019 CT scan, the ulceration areas are not significantly changed.  Given the chronic nature of these CT findings, no fever, no active drainage noted, no significant surrounding erythema on current exam, feel patient is most appropriate for outpatient management.  I did discuss the findings with Dr. Janee Morn on-call for general surgery who reviewed the CT scans and the case with me.  Given  patient's overall clinical status and lab work, he feels patient is most appropriate for outpatient management.  Out of an abundance of precaution, will start patient on some oral antibiotics.  I have placed ambulatory referral order for outpatient wound care center.  Stressed patient needs to follow-up with a wound care specialist to have this further monitored.  I had lengthy discussion regarding strict return precautions should his wound be worsening significantly or show signs of infection such as increasing drainage, redness, fever, etc. Vitals are wnl at this time. Remained afebrile.     After the discussed management above, the patient was determined to be safe for discharge.  The patient was in agreement with this plan and all questions regarding their care were answered.  ED return precautions were discussed and the patient will return to the ED with any significant worsening of condition.         Final Clinical Impression(s) / ED Diagnoses Final diagnoses:  Visit for wound check  Pressure injury of skin, unspecified injury stage, unspecified location    Rx / DC Orders ED Discharge Orders          Ordered    doxycycline (VIBRAMYCIN) 100 MG capsule  2 times daily        05/03/21 2026    AMB referral to wound care center       Comments: Chronic decubitus ulcer, spina bifida pt   05/03/21  2026              Milagros Loll, MD 05/03/21 2033    Milagros Loll, MD 05/03/21 2033

## 2021-05-03 NOTE — ED Provider Triage Note (Signed)
Emergency Medicine Provider Triage Evaluation Note ? ?Anthony [name] , a 51 y.o. male  was evaluated in triage.  Pt complains of wanting a wound check.  Patient has a history of spina bifida and is wheelchair-bound, and previously had a chronic indwelling Foley catheter.  He states that he has an "open area" on his bottom, close to his rectum that he says is draining.  He also felt as though his blood pressure and heart rate were elevated.  He has noticed this for about a week. ? ?Review of Systems  ?Positive: Wound, tachycardia ?Negative: Fever, chills, diarrhea, abdominal pain ? ?Physical Exam  ?BP (!) 164/106 (BP Location: Right Arm)   Pulse (!) 125   Temp 98.7 ?F (37.1 ?C) (Oral)   Resp 18   Ht 5\' 8"  (1.727 m)   Wt 80 kg   SpO2 97%   BMI 26.82 kg/m?  ?Gen:   Awake, no distress   ?Resp:  Normal effort  ?MSK:   Moves extremities without difficulty  ?Other:   ? ?Medical Decision Making  ?Medically screening exam initiated at 12:22 PM.  Appropriate orders placed.  was informed that the remainder of the evaluation will be completed by another provider, this initial triage assessment does not replace that evaluation, and the importance of remaining in the ED until their evaluation is complete. ? ? ?  ?Alleah Dearman T, PA-C ?05/03/21 1222 ? ?

## 2021-05-03 NOTE — ED Triage Notes (Signed)
Pt arrives POV for eval of wound to R buttock cheek. Pt reports he first noted last week, and now w/ drainage and malaise. Pt is wheelchair bound at baseline. States he has a hx of pressure ulcers.  ?

## 2021-05-03 NOTE — Discharge Instructions (Signed)
Please follow-up with the wound care center.  Please take antibiotic as prescribed.  If you have a fever, you notice increasing drainage, recurrent elevated heart rate, increasing redness or increasing swelling, please return to the emergency room for reassessment. ?

## 2021-05-08 LAB — CULTURE, BLOOD (ROUTINE X 2)
Culture: NO GROWTH
Culture: NO GROWTH
Special Requests: ADEQUATE
Special Requests: ADEQUATE

## 2021-05-13 ENCOUNTER — Encounter: Payer: Self-pay | Admitting: Student

## 2021-05-13 ENCOUNTER — Ambulatory Visit (INDEPENDENT_AMBULATORY_CARE_PROVIDER_SITE_OTHER): Payer: Medicare Other | Admitting: Student

## 2021-05-13 VITALS — BP 147/69 | HR 97 | Temp 98.7°F | Ht 68.0 in

## 2021-05-13 DIAGNOSIS — L89329 Pressure ulcer of left buttock, unspecified stage: Secondary | ICD-10-CM | POA: Diagnosis not present

## 2021-05-13 DIAGNOSIS — G8220 Paraplegia, unspecified: Secondary | ICD-10-CM

## 2021-05-13 HISTORY — DX: Pressure ulcer of left buttock, unspecified stage: L89.329

## 2021-05-13 NOTE — Progress Notes (Addendum)
? ? ?  SUBJECTIVE:  ? ?CHIEF COMPLAINT / HPI:  ? ?Wound ?Noticed wound about 2 weeks, denies pain, drains a greenish/yellow fluid. Denies chills, feels feverish on occasion but hasn't checked temperature. Was seen at ED on 05/03/21. CT showed soft tissue ulceration, no abscess, with chronic osteomyelitis  of bilateral pubic rami. He was started on doxycycline 100 mg BID and will finish tomorrow. He was also referred to wound care but in [place] which is not ideal for pt d/t location. Transportation is an issues, he lives alone, typically takes the bus.  Occasionally his mother can help with transportation however she does not live close by and cannot not always help. ? ?*See photo of wound in media tab.  ? ?Of note, pt also has sacral decubitus wound for about 10 years.  ? ?PERTINENT  PMH / PSH: Pressure ulcers, spina bifida, paraplegic ? ?OBJECTIVE:  ? ?Vitals:  ? 05/13/21 1045 05/13/21 1225  ?BP: (!) 147/92 (!) 147/69  ?Pulse: 97   ?Temp:  98.7 ?F (37.1 ?C)  ?SpO2: 97%   ? ? ? ?General: NAD, pleasant, able to participate in exam ?Cardiac: Well perfused ?Respiratory: Breathing comfortably on room air ?Skin: Non erythematous, nonedematous, nondraining, clean appearing wound on left buttock, likely pressure ulcer stage 2-3.  Chaperoned by [facility] April. See photo in media tab. ?Neuro: alert, no obvious focal deficits ?Psych: Normal affect and mood ? ?ASSESSMENT/PLAN:  ? ?Pressure injury of skin of left buttock ?Wound does not appear to be infected.  Patient would benefit from wound care, specifically with home health as patient has difficulty with transportation.  Patient is wheelchair-bound and typically takes the bus for transportation which is difficult.  ?-referral placed for wound care with home health ?-tefla and Vaseline provided for pt for dressing changes ?  ?HTN ?Blood pressure elevated today. Was 132/79 at ED visit on 05/03/2021.  Took his blood pressure medication this morning, Zestoretic 20-25 mg daily.  Patient takes his blood pressure at home.  Advised patient to record blood pressure readings and return for blood pressure check if persistently elevated over 140/90.  ? ?Pt to return for health maintenance and to follow up on blood pressure at his convenience ?-Referral placed to CCM for transportation assistance  ? ?Dr. Erick Alley, DO ?[facility]  ? ? ? ? ?

## 2021-05-13 NOTE — Patient Instructions (Signed)
It was great to see you! Thank you for allowing me to participate in your care! ? ?Our plans for today:  ?- I have placed a referral for home health so you can receive wound care at home. They will contact you to set this up.  ? ? ?Take care and seek immediate care sooner if you develop any concerns.  ? ?Dr. Erick Alley, DO ?[facility] ? ?

## 2021-05-13 NOTE — Addendum Note (Signed)
Addended by: Howard Pouch on: 05/13/2021 04:48 PM ? ? Modules accepted: Orders ? ?

## 2021-05-13 NOTE — Assessment & Plan Note (Signed)
Wound does not appear to be infected.  Patient would benefit from wound care, specifically with home health as patient has difficulty with transportation.  Patient is wheelchair-bound and typically takes the bus for transportation which is difficult.  ?-referral placed for wound care with home health ?-tefla and Vaseline provided for pt for dressing changes ?

## 2021-05-14 ENCOUNTER — Ambulatory Visit: Payer: Medicare Other | Admitting: Nurse Practitioner

## 2021-05-15 NOTE — Addendum Note (Signed)
Addended by: Micheline Rough on: 05/15/2021 04:55 PM ? ? Modules accepted: Orders ? ?

## 2021-05-16 ENCOUNTER — Telehealth: Payer: Self-pay

## 2021-05-16 DIAGNOSIS — G8220 Paraplegia, unspecified: Secondary | ICD-10-CM | POA: Diagnosis not present

## 2021-05-16 DIAGNOSIS — Z9350 Unspecified cystostomy status: Secondary | ICD-10-CM | POA: Diagnosis not present

## 2021-05-16 DIAGNOSIS — Z89421 Acquired absence of other right toe(s): Secondary | ICD-10-CM | POA: Diagnosis not present

## 2021-05-16 DIAGNOSIS — Z993 Dependence on wheelchair: Secondary | ICD-10-CM | POA: Diagnosis not present

## 2021-05-16 DIAGNOSIS — Q059 Spina bifida, unspecified: Secondary | ICD-10-CM | POA: Diagnosis not present

## 2021-05-16 DIAGNOSIS — L89613 Pressure ulcer of right heel, stage 3: Secondary | ICD-10-CM | POA: Diagnosis not present

## 2021-05-16 DIAGNOSIS — M24561 Contracture, right knee: Secondary | ICD-10-CM | POA: Diagnosis not present

## 2021-05-16 DIAGNOSIS — M24562 Contracture, left knee: Secondary | ICD-10-CM | POA: Diagnosis not present

## 2021-05-16 DIAGNOSIS — L89324 Pressure ulcer of left buttock, stage 4: Secondary | ICD-10-CM | POA: Diagnosis not present

## 2021-05-16 DIAGNOSIS — I10 Essential (primary) hypertension: Secondary | ICD-10-CM | POA: Diagnosis not present

## 2021-05-16 DIAGNOSIS — M24551 Contracture, right hip: Secondary | ICD-10-CM | POA: Diagnosis not present

## 2021-05-16 DIAGNOSIS — M24552 Contracture, left hip: Secondary | ICD-10-CM | POA: Diagnosis not present

## 2021-05-16 NOTE — Telephone Encounter (Signed)
? ?  Telephone encounter was:  Unsuccessful.  05/16/2021 ?Name: Anthony [name] MRN: [id number] DOB: 30-Mar-1970 ? ?Unsuccessful outbound call made today to assist with:  Transportation Needs  ? ?Outreach Attempt:  1st Attempt ? ?A HIPAA compliant voice message was left requesting a return call.  Instructed patient to call back at [phone]. ? ?Kadra Kohan, [place], CHC ?Care Guide  Embedded Care Coordination ?[place]  Care Management  ?[address] ?[place], [place] [address] ?[email or web address]  ?? [phone]   ?[email or web address] ?  ?

## 2021-05-16 NOTE — Telephone Encounter (Signed)
Kathlee Nations [facility] RN calls nurse line requesting verbal orders for wound care as follows.  ? ?Wet to dry dressing changes daily for left foot  ? ?Collagen and foam dressing changes 2x weekly for right foot  ? ?Add PT and MSW evaluation.  ? ?Verbal orders given.  ?  ?

## 2021-05-17 ENCOUNTER — Telehealth: Payer: Self-pay

## 2021-05-17 NOTE — Telephone Encounter (Signed)
? ?  Telephone encounter was:  Successful.  ?05/17/2021 ?Name: GARL SPEIGNER MRN: [id number] DOB: 22-Aug-1970 ? ?RAYN ENDERSON is a 51 y.o. year old male who is a primary care patient of Erick Alley, DO . The community resource team was consulted for assistance with Transportation Needs  ? ?Care guide performed the following interventions: Patient provided with information about care guide support team and interviewed to confirm resource needs. Patient advised he needs wheelchair transportation for 05/20/2021 and 06/10/2021. Patient also advised he used [employer] before and would to be set up with them again. CG has sent an urgent request to [facility] transportation for 5/1 and 5/22. CG will relay information that [employer] [place] application needs to go out.  ? ?Hessie Knows ?Care Guide, Embedded Care Coordination ?[facility]  Care management  ?[place], [place] [address]  ?Main Phone: [phone]  E-mail: [email or web address]  ?Website: [email or web address] ? ? ? ?

## 2021-05-17 NOTE — Telephone Encounter (Signed)
? ?  Telephone encounter was:  Successful.  ?05/17/2021 ?Name: Anthony [name] MRN: [id number] DOB: 02-Oct-1970 ? ?Anthony [name] is a 50 y.o. year old male who is a primary care patient of Erick Alley, DO . The community resource team was consulted for assistance with Transportation Needs  ? ?Care guide performed the following interventions: Spoke with patient to inform him that an [employer] application would mailed to him on Monday.  Patient stated that he did not need anything else at this time. ? ?Follow Up Plan:   I will mail [employer] transportation application to patient on Monday and follow up to ensure he receives it.  ? ?Merek Niu, AAS Paralegal, CHC ?Care Guide  Embedded Care Coordination ?[place]  Care Management  ?[address] ?[place], [place] [address] ?[email or web address]  ?? [phone]   ?[email or web address] ?  ?

## 2021-05-20 ENCOUNTER — Encounter: Payer: Self-pay | Admitting: Cardiology

## 2021-05-20 ENCOUNTER — Ambulatory Visit (INDEPENDENT_AMBULATORY_CARE_PROVIDER_SITE_OTHER): Payer: Medicare Other | Admitting: Cardiology

## 2021-05-20 VITALS — BP 110/72 | HR 72 | Ht 68.0 in

## 2021-05-20 DIAGNOSIS — I493 Ventricular premature depolarization: Secondary | ICD-10-CM

## 2021-05-20 MED ORDER — FLECAINIDE ACETATE 50 MG PO TABS: 50.0000 mg | ORAL_TABLET | Freq: Two times a day (BID) | ORAL | 3 refills | Status: DC

## 2021-05-20 NOTE — Patient Instructions (Addendum)
Medication Instructions:  ?Your physician has recommended you make the following change in your medication:  ? ?** Begin Flecainide 50mg  - 1 tablet by mouth twice daily. ? ?*If you need a refill on your cardiac medications before your next appointment, please call your pharmacy* ? ? ?Lab Work: ?None ordered. ? ?If you have labs (blood work) drawn today and your tests are completely normal, you will receive your results only by: ?MyChart Message (if you have MyChart) OR ?A paper copy in the mail ?If you have any lab test that is abnormal or we need to change your treatment, we will call you to review the results. ? ? ?Testing/Procedures: ?None ordered. ? ? ? ?Follow-Up: ?At [facility], you and your health needs are our priority.  As part of our continuing mission to provide you with exceptional heart care, we have created designated Provider Care Teams.  These Care Teams include your primary Cardiologist (physician) and Advanced Practice Providers (APPs -  Physician Assistants and Nurse Practitioners) who all work together to provide you with the care you need, when you need it. ? ?We recommend signing up for the patient portal called "MyChart".  Sign up information is provided on this After Visit Summary.  MyChart is used to connect with patients for Virtual Visits (Telemedicine).  Patients are able to view lab/test results, encounter notes, upcoming appointments, etc.  Non-urgent messages can be sent to your provider as well.   ?To learn more about what you can do with MyChart, go to [email or web address].   ? ?Your next appointment:   ? ?Monday 07/01/2021 at 155pm with Tommye Standard, PA-C ? ? ?Important Information About [employer] ? ? ? ? ?  ?

## 2021-05-20 NOTE — Addendum Note (Signed)
Addended by: Jama Flavors on: 05/20/2021 01:34 PM ? ? Modules accepted: Orders ? ?

## 2021-05-20 NOTE — Progress Notes (Signed)
? ?Electrophysiology Office Note ? ? ?Date:  05/20/2021  ? ?ID:  Anthony [name], DOB [birth year], MRN [id number] ? ?PCP:  Erick Alley, DO  ?Cardiologist: Mayford Knife ?Primary Electrophysiologist:  Aftan Vint Jorja Loa, MD   ? ?Chief Complaint: PVCs ?  ?History of Present Illness: ?Anthony [name] is a 51 y.o. male who is being seen today for the evaluation of PVCs at the request of Turner, Cornelious Bryant, MD. Presenting today for electrophysiology evaluation. ? ?He has a history of spina bifida and is wheelchair-bound, chronic indwelling catheter.  He was having episodes of chest pain.  He does have a remote smoking history.  CTA showed no PE and no coronary calcium.  He had a stress test which was normal.  He wore a cardiac monitor that showed an 18% burden of PVCs.  He has palpitations, but not much fatigue or shortness of breath.  He had a cardiac MRI that showed normal LV function without scar. ? ?Today, he denies symptoms of palpitations, chest pain, shortness of breath, orthopnea, PND, lower extremity edema, claudication, dizziness, presyncope, syncope, bleeding, or neurologic sequela. The patient is tolerating medications without difficulties.  His palpitations are intermittent.  He does not do much activity currently, but does continue to have palpitations. ? ? ?Past Medical History:  ?Diagnosis Date  ? Chronic indwelling Foley catheter   ? PVCs (premature ventricular contractions)   ? Spina bifida   ? Wheelchair bound   ? ?Past Surgical History:  ?Procedure Laterality Date  ? BACK SURGERY    ? HIP SURGERY    ? INCISION AND DRAINAGE OF WOUND N/A 01/24/2017  ? Procedure: IRRIGATION AND DEBRIDEMENT WOUND- BUTTOCK ABSCESS;  Surgeon: Almond Lint, MD;  Location: [facility];  Service: General;  Laterality: N/A;  ? ? ? ?Current Outpatient Medications  ?Medication Sig Dispense Refill  ? Catheters (FOLEY CATHETER 2-WAY) MISC Use as directed by urologist. 5 each 1  ? flecainide (TAMBOCOR) 50 MG tablet Take 1 tablet (50 mg total) by  mouth 2 (two) times daily. 180 tablet 3  ? lisinopril-hydrochlorothiazide (ZESTORETIC) 20-25 MG tablet Take 1 tablet by mouth daily. 90 tablet 3  ? metoprolol succinate (TOPROL-XL) 50 MG 24 hr tablet Take 1 tablet (50 mg total) by mouth 2 (two) times daily. Take with or immediately following a meal. 180 tablet 3  ? Multiple Vitamin (MULTIVITAMIN WITH MINERALS) TABS tablet Take 1 tablet by mouth daily.    ? Omega-3 Fatty Acids (FISH OIL) 1000 MG CAPS Take 1 capsule by mouth 2 (two) times daily.    ? Potassium 99 MG TABS Take 1 tablet by mouth 2 (two) times daily.    ? rosuvastatin (CRESTOR) 40 MG tablet Take 1 tablet (40 mg total) by mouth daily. 90 tablet 3  ? ?No current facility-administered medications for this visit.  ? ? ?Allergies:   Vancomycin  ? ?Social History:  The patient  reports that he has never smoked. He has never used smokeless tobacco. He reports that he does not drink alcohol.  ? ?Family History:  The patient's family history includes CAD in his father and maternal grandfather; Sudden Cardiac Death in his sister.  ? ? ?ROS:  Please see the history of present illness.   Otherwise, review of systems is positive for none.   All other systems are reviewed and negative.  ? ? ?PHYSICAL EXAM: ?VS:  BP 110/72   Pulse 72   Ht 5\' 8"  (1.727 m)   SpO2 98%   BMI 26.82  kg/m?  , BMI Body mass index is 26.82 kg/m?. ?GEN: Well nourished, well developed, in no acute distress  ?HEENT: normal  ?Neck: no JVD, carotid bruits, or masses ?Cardiac: irregular; no murmurs, rubs, or gallops,no edema  ?Respiratory:  clear to auscultation bilaterally, normal work of breathing ?GI: soft, nontender, nondistended, + BS ?MS: no deformity or atrophy  ?Skin: warm and dry ?Neuro:  Strength and sensation are intact ?Psych: euthymic mood, full affect ? ?EKG:  EKG is ordered today. ?Personal review of the ekg ordered shows sinus rhythm, PVCs ? ?Recent Labs: ?05/03/2021: ALT 20; BUN 10; Creatinine, Ser 0.61; Hemoglobin 12.6; Platelets  465; Potassium 3.2; Sodium 143  ? ? ?Lipid Panel  ?   ?Component Value Date/Time  ? CHOL 143 03/26/2021 1356  ? TRIG 218 (H) 03/26/2021 1356  ? HDL 26 (L) 03/26/2021 1356  ? CHOLHDL 5.5 (H) 03/26/2021 1356  ? CHOLHDL 7.0 02/06/2014 1449  ? VLDL 43 (H) 02/06/2014 1449  ? LDLCALC 80 03/26/2021 1356  ? ? ? ?Wt Readings from Last 3 Encounters:  ?05/03/21 176 lb 5.9 oz (80 kg)  ?03/26/21 175 lb (79.4 kg)  ?03/08/20 170 lb (77.1 kg)  ?  ? ? ?Other studies Reviewed: ?Additional studies/ records that were reviewed today include: CMRI 04/29/20  ?Review of the above records today demonstrates:  ?1. Normal LV size and wall thickness, with low normal systolic ?function (EF 54%) ?  ?2.  Normal RV size and systolic function (EF 57%) ?  ?3.  No late gadolinium enhancement to suggest myocardial scar ? ?04/26/2021 personally reviewed ?Predominant rhythm was normal sinus rhythm with average heart rate 78 bpm. The heart rate ranged from 48 to 132 bpm ?Frequent PVCs, ventricular couplets, bigeminal and trigeminal PVCs with PVC load 18% ?Atrial triplet and nonsustained atrial tachycardia with episode lasting 17 beats with a maximum heart rate of 169 bpm. ? ?ASSESSMENT AND PLAN: ? ?1.  PVCs: Has a high burden at 18%.  He is symptomatic with palpitations.  He does not have much fatigue or shortness of breath.  He does have spina bifida and and is in a wheelchair.  He would like to avoid procedures.  We Sophronia Varney start him on flecainide 50 mg twice daily.  We Jomayra Novitsky have him follow-up in clinic with an EP app in 6 weeks.  If he is continuing to have episodes of palpitations, Jeff Frieden increase flecainide to 100 mg. ? ?Case discussed with primary cardiology ? ?Current medicines are reviewed at length with the patient today.   ?The patient does not have concerns regarding his medicines.  The following changes were made today: ? ? ?Labs/ tests ordered today include:  ?No orders of the defined types were placed in this encounter. ? ? ? ?Disposition:   FU  with Fernand Sorbello 6 weeks ? ?Signed, ?Layonna Dobie Jorja Loa, MD  ?05/20/2021 12:41 PM    ? ?[facility] ?[address] ?[address] ?[place] [address] ?[phone] (office) ?[phone] (fax) ? ?

## 2021-05-21 DIAGNOSIS — L89324 Pressure ulcer of left buttock, stage 4: Secondary | ICD-10-CM | POA: Diagnosis not present

## 2021-05-21 DIAGNOSIS — I10 Essential (primary) hypertension: Secondary | ICD-10-CM | POA: Diagnosis not present

## 2021-05-21 DIAGNOSIS — L89613 Pressure ulcer of right heel, stage 3: Secondary | ICD-10-CM | POA: Diagnosis not present

## 2021-05-21 DIAGNOSIS — G8220 Paraplegia, unspecified: Secondary | ICD-10-CM | POA: Diagnosis not present

## 2021-05-21 DIAGNOSIS — Q059 Spina bifida, unspecified: Secondary | ICD-10-CM | POA: Diagnosis not present

## 2021-05-21 DIAGNOSIS — M24551 Contracture, right hip: Secondary | ICD-10-CM | POA: Diagnosis not present

## 2021-05-23 ENCOUNTER — Telehealth: Payer: Self-pay

## 2021-05-23 NOTE — Telephone Encounter (Signed)
Wound Care RN LVM on nurse line reporting the patient has refused wound care services.  ? ?I attempted to call her back to discuss, however I had to LVM.  ? ?Will await her call. ? ? ?

## 2021-05-31 DIAGNOSIS — Q059 Spina bifida, unspecified: Secondary | ICD-10-CM | POA: Diagnosis not present

## 2021-05-31 DIAGNOSIS — L89613 Pressure ulcer of right heel, stage 3: Secondary | ICD-10-CM | POA: Diagnosis not present

## 2021-05-31 DIAGNOSIS — L89324 Pressure ulcer of left buttock, stage 4: Secondary | ICD-10-CM | POA: Diagnosis not present

## 2021-05-31 DIAGNOSIS — M24551 Contracture, right hip: Secondary | ICD-10-CM | POA: Diagnosis not present

## 2021-05-31 DIAGNOSIS — G8220 Paraplegia, unspecified: Secondary | ICD-10-CM | POA: Diagnosis not present

## 2021-05-31 DIAGNOSIS — I10 Essential (primary) hypertension: Secondary | ICD-10-CM | POA: Diagnosis not present

## 2021-06-03 ENCOUNTER — Telehealth: Payer: Self-pay

## 2021-06-03 NOTE — Telephone Encounter (Signed)
? ?  Telephone encounter was:  Successful.  ?06/03/2021 ?Name: Anthony [name] MRN: [id number] DOB: Sep 17, 1970 ? ?Anthony [name] is a 51 y.o. year old male who is a primary care patient of Erick Alley, DO . The community resource team was consulted for assistance with Transportation Needs  ? ?Care guide performed the following interventions: Spoke with patient he has received and completed the [employer] [place] transportation application.  He stated that he completed his portion and has given it to his Child psychotherapist. ? ?Follow Up Plan:  No further follow up planned at this time. The patient has been provided with needed resources. ? ?Krysia Zahradnik, AAS Paralegal, CHC ?Care Guide  Embedded Care Coordination ?[place]  Care Management  ?[address] ?[place], [place] [address] ?[email or web address]  ?? [phone]   ?[email or web address] ?  ?

## 2021-06-04 DIAGNOSIS — Q059 Spina bifida, unspecified: Secondary | ICD-10-CM | POA: Diagnosis not present

## 2021-06-04 DIAGNOSIS — G8220 Paraplegia, unspecified: Secondary | ICD-10-CM | POA: Diagnosis not present

## 2021-06-04 DIAGNOSIS — L89324 Pressure ulcer of left buttock, stage 4: Secondary | ICD-10-CM | POA: Diagnosis not present

## 2021-06-04 DIAGNOSIS — L89613 Pressure ulcer of right heel, stage 3: Secondary | ICD-10-CM | POA: Diagnosis not present

## 2021-06-04 DIAGNOSIS — M24551 Contracture, right hip: Secondary | ICD-10-CM | POA: Diagnosis not present

## 2021-06-04 DIAGNOSIS — I10 Essential (primary) hypertension: Secondary | ICD-10-CM | POA: Diagnosis not present

## 2021-06-10 ENCOUNTER — Encounter (HOSPITAL_BASED_OUTPATIENT_CLINIC_OR_DEPARTMENT_OTHER): Payer: Medicare Other | Admitting: General Surgery

## 2021-06-25 ENCOUNTER — Encounter: Payer: Self-pay | Admitting: *Deleted

## 2021-06-27 ENCOUNTER — Other Ambulatory Visit: Payer: Self-pay | Admitting: Cardiology

## 2021-06-27 DIAGNOSIS — I10 Essential (primary) hypertension: Secondary | ICD-10-CM

## 2021-06-30 NOTE — Progress Notes (Deleted)
Cardiology Office Note Date:  06/30/2021  Patient ID:  Anthony [name], Anthony [name] 1970-08-19, MRN [id number] PCP:  Erick Alley, DO  Cardiologist:  Dr. Mayford Knife Electrophysiologist: Dr. Elberta Fortis  ***refresh   Chief Complaint: *** f/u new fkecainide  History of Present Illness: MAT [name] is a 51 y.o. male with history of spina bifida wheelchair bound and chronic indwelling catheter, PVCs  He was originally referred to Dr. Mayford Knife for CP, she reports hostoricallt Chest CTA showed no PE and no coronary Ca. He has a hx of HLD with LDL 134 and HDL 25 in Sept 2021.  Nuclear stress test showed no ischemia and 2D echo was normal.  Event monitor showed a 5 beat run of WCT and occasional PVCs.  A cardiac MRI showed normal LV size and wall thickness with EF 54%, normal RV size and systolic function EF 57% and no LGE to suggest scar and was started on Toprol.  At his f/u with her 03/26/21, had no ongoing CP, + palpitations, EKG w/PVCs/trigeminal, planned for monitoring to assess burden.  Monitor noted 18% PVC burden and referred to Dr. Elberta Fortis and started on Flecainide  *** flecainide EKG, nodal blocker *** symptoms, palps   AAD hx Flecainide started may 2023   Past Medical History:  Diagnosis Date   Chronic indwelling Foley catheter    PVCs (premature ventricular contractions)    Spina bifida    Wheelchair bound     Past Surgical History:  Procedure Laterality Date   BACK SURGERY     HIP SURGERY     INCISION AND DRAINAGE OF WOUND N/A 01/24/2017   Procedure: IRRIGATION AND DEBRIDEMENT WOUND- BUTTOCK ABSCESS;  Surgeon: Almond Lint, MD;  Location: [facility];  Service: General;  Laterality: N/A;    Current Outpatient Medications  Medication Sig Dispense Refill   Catheters (FOLEY CATHETER 2-WAY) MISC Use as directed by urologist. 5 each 1   flecainide (TAMBOCOR) 50 MG tablet Take 1 tablet (50 mg total) by mouth 2 (two) times daily. 180 tablet 3   lisinopril-hydrochlorothiazide (ZESTORETIC)  20-25 MG tablet Take 1 tablet by mouth once daily 90 tablet 2   metoprolol succinate (TOPROL-XL) 50 MG 24 hr tablet Take 1 tablet (50 mg total) by mouth 2 (two) times daily. Take with or immediately following a meal. 180 tablet 3   Multiple Vitamin (MULTIVITAMIN WITH MINERALS) TABS tablet Take 1 tablet by mouth daily.     Omega-3 Fatty Acids (FISH OIL) 1000 MG CAPS Take 1 capsule by mouth 2 (two) times daily.     Potassium 99 MG TABS Take 1 tablet by mouth 2 (two) times daily.     rosuvastatin (CRESTOR) 40 MG tablet Take 1 tablet (40 mg total) by mouth daily. 90 tablet 3   No current facility-administered medications for this visit.    Allergies:   Vancomycin   Social History:  The patient  reports that he has never smoked. He has never used smokeless tobacco. He reports that he does not drink alcohol.   Family History:  The patient's family history includes CAD in his father and maternal grandfather; Sudden Cardiac Death in his sister.  ROS:  Please see the history of present illness.    All other systems are reviewed and otherwise negative.   PHYSICAL EXAM:  VS:  There were no vitals taken for this visit. BMI: There is no height or weight on file to calculate BMI. Well nourished, well developed, in no acute distress HEENT: normocephalic, atraumatic Neck:  no JVD, carotid bruits or masses Cardiac:  *** RRR; no significant murmurs, no rubs, or gallops Lungs:  *** CTA b/l, no wheezing, rhonchi or rales Abd: soft, nontender MS: no deformity or *** atrophy Ext: *** no edema Skin: warm and dry, no rash Neuro:  No gross deficits appreciated Psych: euthymic mood, full affect    EKG:  Done today and reviewed by myself shows  ***  04/25/2020: c.MRI IMPRESSION: 1. Normal LV size and wall thickness, with low normal systolic function (EF 54%) 2.  Normal RV size and systolic function (EF 57%) 3.  No late gadolinium enhancement to suggest myocardial scar   01/05/2020: stress  myoview Nuclear stress EF: 64%. The left ventricular ejection fraction is normal (55-65%). There was no ST segment deviation noted during stress. This is a low risk study. There is no evidence of ischemia or previous infarction . The study is normal.    01/03/2020: TTE  1. Left ventricular ejection fraction, by estimation, is 50 to 55%. The  left ventricle has low normal function. The left ventricle has no regional  wall motion abnormalities. Left ventricular diastolic parameters were  normal.   2. Right ventricular systolic function is normal. The right ventricular  size is normal.   3. The mitral valve is normal in structure. Trivial mitral valve  regurgitation. No evidence of mitral stenosis.   4. The aortic valve is tricuspid. Aortic valve regurgitation is not  visualized. No aortic stenosis is present.   5. The inferior vena cava is normal in size with greater than 50%  respiratory variability, suggesting right atrial pressure of 3 mmHg.    Recent Labs: 05/03/2021: ALT 20; BUN 10; Creatinine, Ser 0.61; Hemoglobin 12.6; Platelets 465; Potassium 3.2; Sodium 143  03/26/2021: Chol/HDL Ratio 5.5; Cholesterol, Total 143; HDL 26; LDL Chol Calc (NIH) 80; Triglycerides 218   CrCl cannot be calculated (Patient's most recent lab result is older than the maximum 21 days allowed.).   Wt Readings from Last 3 Encounters:  05/03/21 176 lb 5.9 oz (80 kg)  03/26/21 175 lb (79.4 kg)  03/08/20 170 lb (77.1 kg)     Other studies reviewed: Additional studies/records reviewed today include: summarized above  ASSESSMENT AND PLAN:  PVCs *** burden on Flecainide by symptoms *** intervals  Disposition: F/u with ***  Current medicines are reviewed at length with the patient today.  The patient did not have any concerns regarding medicines.  Norma Fredrickson, PA-C 06/30/2021 9:48 AM     [facility] [address] [place] [address] [phone] (office)  [phone] (fax)

## 2021-07-01 ENCOUNTER — Ambulatory Visit: Payer: Medicare Other | Admitting: Physician Assistant

## 2021-07-02 ENCOUNTER — Telehealth: Payer: Self-pay

## 2021-07-02 NOTE — Telephone Encounter (Signed)
Marisue Ivan [facility] nurse with Iantha Fallen [facility] calls nurse line reporting patient discharge.   Marisue Ivan reports the patient has declined HH services at this time.   Will forward to PCP to make aware.

## 2021-07-04 ENCOUNTER — Ambulatory Visit: Payer: Medicare Other | Admitting: Physician Assistant

## 2021-07-05 ENCOUNTER — Encounter: Payer: Self-pay | Admitting: Physician Assistant

## 2021-07-05 ENCOUNTER — Ambulatory Visit (INDEPENDENT_AMBULATORY_CARE_PROVIDER_SITE_OTHER): Payer: Medicare Other | Admitting: Physician Assistant

## 2021-07-05 VITALS — BP 122/80 | HR 94 | Ht 68.0 in | Wt 190.6 lb

## 2021-07-05 DIAGNOSIS — Z79899 Other long term (current) drug therapy: Secondary | ICD-10-CM

## 2021-07-05 DIAGNOSIS — I10 Essential (primary) hypertension: Secondary | ICD-10-CM | POA: Diagnosis not present

## 2021-07-05 DIAGNOSIS — Z5181 Encounter for therapeutic drug level monitoring: Secondary | ICD-10-CM

## 2021-07-05 DIAGNOSIS — I493 Ventricular premature depolarization: Secondary | ICD-10-CM

## 2021-07-05 NOTE — Progress Notes (Signed)
Cardiology Office Note Date:  07/05/2021  Patient ID:  Anthony [name], Anthony [name] 1970-03-02, MRN [id number] PCP:  Erick Alley, DO  Cardiologist:  Dr. Mayford Knife Electrophysiologist: Dr. Elberta Fortis    Chief Complaint:  f/u new flecainide  History of Present Illness: Anthony [name] is a 51 y.o. male with history of spina bifida wheelchair bound and chronic indwelling catheter, PVCs, HTN  He was originally referred to Dr. Mayford Knife for CP, she reports historically Chest CTA showed no PE and no coronary Ca. He has a hx of HLD with LDL 134 and HDL 25 in Sept 2021.  Nuclear stress test showed no ischemia and 2D echo was normal.  Event monitor showed a 5 beat run of WCT and occasional PVCs.  A cardiac MRI showed normal LV size and wall thickness with EF 54%, normal RV size and systolic function EF 57% and no LGE to suggest scar and was started on Toprol.  At his f/u with her 03/26/21, had no ongoing CP, + palpitations, EKG w/PVCs/trigeminal, planned for monitoring to assess burden.  Monitor noted 18% PVC burden and referred to Dr. Elberta Fortis and started on Flecainide  TODAY He thinks he can tell an improvement with the flecainide No real cardiac awareness. He gets a little dizzy with quick movements of his head, not lightheaded, no near syncope or syncope. No CP, no SOB. He wheels himself to the store and gets exercise that way.    AAD hx Flecainide started may 2023   Past Medical History:  Diagnosis Date   Chronic indwelling Foley catheter    PVCs (premature ventricular contractions)    Spina bifida    Wheelchair bound     Past Surgical History:  Procedure Laterality Date   BACK SURGERY     HIP SURGERY     INCISION AND DRAINAGE OF WOUND N/A 01/24/2017   Procedure: IRRIGATION AND DEBRIDEMENT WOUND- BUTTOCK ABSCESS;  Surgeon: Almond Lint, MD;  Location: [facility];  Service: General;  Laterality: N/A;    Current Outpatient Medications  Medication Sig Dispense Refill   Catheters (FOLEY CATHETER  2-WAY) MISC Use as directed by urologist. 5 each 1   flecainide (TAMBOCOR) 50 MG tablet Take 1 tablet (50 mg total) by mouth 2 (two) times daily. 180 tablet 3   lisinopril-hydrochlorothiazide (ZESTORETIC) 20-25 MG tablet Take 1 tablet by mouth once daily 90 tablet 2   metoprolol succinate (TOPROL-XL) 50 MG 24 hr tablet Take 1 tablet (50 mg total) by mouth 2 (two) times daily. Take with or immediately following a meal. 180 tablet 3   Multiple Vitamin (MULTIVITAMIN WITH MINERALS) TABS tablet Take 1 tablet by mouth daily.     Omega-3 Fatty Acids (FISH OIL) 1000 MG CAPS Take 1 capsule by mouth 2 (two) times daily.     Potassium 99 MG TABS Take 1 tablet by mouth 2 (two) times daily.     rosuvastatin (CRESTOR) 40 MG tablet Take 1 tablet (40 mg total) by mouth daily. 90 tablet 3   No current facility-administered medications for this visit.    Allergies:   Vancomycin   Social History:  The patient  reports that he has never smoked. He has never used smokeless tobacco. He reports that he does not drink alcohol.   Family History:  The patient's family history includes CAD in his father and maternal grandfather; Sudden Cardiac Death in his sister.  ROS:  Please see the history of present illness.    All other systems are reviewed and  otherwise negative.   PHYSICAL EXAM:  VS:  BP 122/80   Pulse 94   Ht 5\' 8"  (1.727 m)   Wt 190 lb 9.6 oz (86.5 kg)   SpO2 92%   BMI 28.98 kg/m  BMI: Body mass index is 28.98 kg/m. Well nourished, well developed, in no acute distress HEENT: normocephalic, atraumatic Neck: no JVD, carotid bruits or masses Cardiac:  RRR; no extrasystoles with prolonged auscultation no significant murmurs, no rubs, or gallops Lungs:  CTA b/l, no wheezing, rhonchi or rales Abd: soft, nontender MS: LE atrophy b/l Ext: chronic looking skin changes R>L, trace edema Skin: warm and dry, no rash Neuro:  wheelchair bound 2/2 spina bifida  Psych: euthymic mood, full affect    EKG:   Done today and reviewed by myself shows  SR 94bpm, PVC, intervals look ok, PR , QRS 80ms, QYc 475  04/25/2020: c.MRI IMPRESSION: 1. Normal LV size and wall thickness, with low normal systolic function (EF 54%) 2.  Normal RV size and systolic function (EF 57%) 3.  No late gadolinium enhancement to suggest myocardial scar   01/05/2020: stress myoview Nuclear stress EF: 64%. The left ventricular ejection fraction is normal (55-65%). There was no ST segment deviation noted during stress. This is a low risk study. There is no evidence of ischemia or previous infarction . The study is normal.    01/03/2020: TTE  1. Left ventricular ejection fraction, by estimation, is 50 to 55%. The  left ventricle has low normal function. The left ventricle has no regional  wall motion abnormalities. Left ventricular diastolic parameters were  normal.   2. Right ventricular systolic function is normal. The right ventricular  size is normal.   3. The mitral valve is normal in structure. Trivial mitral valve  regurgitation. No evidence of mitral stenosis.   4. The aortic valve is tricuspid. Aortic valve regurgitation is not  visualized. No aortic stenosis is present.   5. The inferior vena cava is normal in size with greater than 50%  respiratory variability, suggesting right atrial pressure of 3 mmHg.    Recent Labs: 05/03/2021: ALT 20; BUN 10; Creatinine, Ser 0.61; Hemoglobin 12.6; Platelets 465; Potassium 3.2; Sodium 143  03/26/2021: Chol/HDL Ratio 5.5; Cholesterol, Total 143; HDL 26; LDL Chol Calc (NIH) 80; Triglycerides 218   CrCl cannot be calculated (Patient's most recent lab result is older than the maximum 21 days allowed.).   Wt Readings from Last 3 Encounters:  07/05/21 190 lb 9.6 oz (86.5 kg)  05/03/21 176 lb 5.9 oz (80 kg)  03/26/21 175 lb (79.4 kg)     Other studies reviewed: Additional studies/records reviewed today include: summarized above  ASSESSMENT AND  PLAN:  PVCs improved burden on Flecainide by symptoms On Toprol stable intervals  2. HTN Looks good  Disposition: F/u with 05/26/21 in 45mo, sooner if needed  Current medicines are reviewed at length with the patient today.  The patient did not have any concerns regarding medicines.  11mo, PA-C 07/05/2021 4:46 PM     [facility] [address] [place] [phone] (office)  [phone] (fax)

## 2021-07-05 NOTE — Patient Instructions (Signed)
Medication Instructions:   Your physician recommends that you continue on your current medications as directed. Please refer to the Current Medication list given to you today.  *If you need a refill on your cardiac medications before your next appointment, please call your pharmacy*   Lab Work: NONE ORDERED  TODAY   If you have labs (blood work) drawn today and your tests are completely normal, you will receive your results only by: MyChart Message (if you have MyChart) OR A paper copy in the mail If you have any lab test that is abnormal or we need to change your treatment, we will call you to review the results.   Testing/Procedures: Oncology    Follow-Up: At [facility], you and your health needs are our priority.  As part of our continuing mission to provide you with exceptional heart care, we have created designated Provider Care Teams.  These Care Teams include your primary Cardiologist (physician) and Advanced Practice Providers (APPs -  Physician Assistants and Nurse Practitioners) who all work together to provide you with the care you need, when you need it.  We recommend signing up for the patient portal called "MyChart".  Sign up information is provided on this After Visit Summary.  MyChart is used to connect with patients for Virtual Visits (Telemedicine).  Patients are able to view lab/test results, encounter notes, upcoming appointments, etc.  Non-urgent messages can be sent to your provider as well.   To learn more about what you can do with MyChart, go to [email or web address].    Your next appointment:   4 month(s)  The format for your next appointment:   In Person  Provider:   You may see Will Jorja Loa, MD or one of the following Advanced Practice Providers on your designated Care Team:   Francis Dowse, [place]       Other Instructions   Important Information About Sugar

## 2021-07-18 ENCOUNTER — Telehealth: Payer: Self-pay | Admitting: Cardiology

## 2021-07-18 NOTE — Telephone Encounter (Signed)
Pt c/o medication issue:  1. Name of Medication: metoprolol succinate (TOPROL-XL) 50 MG 24 hr tablet  2. How are you currently taking this medication (dosage and times per day)? As written  3. Are you having a reaction (difficulty breathing--STAT)? No   4. What is your medication issue? Pt called stating he got a refill for 25mg  a day and wants to clarify how much should he take since its 25mg  and not 50mg . Please advise.

## 2021-07-18 NOTE — Telephone Encounter (Signed)
Spoke with the patient and advised that he should be taking Toprol 50 mg twice daily. Patient verbalized understanding.

## 2021-07-25 ENCOUNTER — Other Ambulatory Visit: Payer: Self-pay | Admitting: Student

## 2021-07-25 DIAGNOSIS — Q057 Lumbar spina bifida without hydrocephalus: Secondary | ICD-10-CM

## 2021-09-03 ENCOUNTER — Encounter (HOSPITAL_COMMUNITY): Payer: Self-pay | Admitting: Pharmacy Technician

## 2021-09-03 ENCOUNTER — Emergency Department (HOSPITAL_COMMUNITY): Payer: Medicare Other

## 2021-09-03 ENCOUNTER — Inpatient Hospital Stay (HOSPITAL_COMMUNITY)
Admission: EM | Admit: 2021-09-03 | Discharge: 2021-09-24 | DRG: 853 | Disposition: A | Discharge status: Skilled Nursing Facility | Payer: Medicare Other | Attending: Family Medicine | Admitting: Family Medicine

## 2021-09-03 ENCOUNTER — Other Ambulatory Visit: Payer: Self-pay

## 2021-09-03 DIAGNOSIS — S73002A Unspecified subluxation of left hip, initial encounter: Secondary | ICD-10-CM | POA: Diagnosis not present

## 2021-09-03 DIAGNOSIS — K5900 Constipation, unspecified: Secondary | ICD-10-CM | POA: Insufficient documentation

## 2021-09-03 DIAGNOSIS — L89893 Pressure ulcer of other site, stage 3: Secondary | ICD-10-CM | POA: Diagnosis present

## 2021-09-03 DIAGNOSIS — R6520 Severe sepsis without septic shock: Secondary | ICD-10-CM | POA: Diagnosis not present

## 2021-09-03 DIAGNOSIS — M6281 Muscle weakness (generalized): Secondary | ICD-10-CM | POA: Diagnosis not present

## 2021-09-03 DIAGNOSIS — R5381 Other malaise: Secondary | ICD-10-CM | POA: Diagnosis not present

## 2021-09-03 DIAGNOSIS — I10 Essential (primary) hypertension: Secondary | ICD-10-CM | POA: Diagnosis not present

## 2021-09-03 DIAGNOSIS — K820 Obstruction of gallbladder: Secondary | ICD-10-CM | POA: Diagnosis not present

## 2021-09-03 DIAGNOSIS — N360 Urethral fistula: Secondary | ICD-10-CM | POA: Diagnosis present

## 2021-09-03 DIAGNOSIS — A4181 Sepsis due to Enterococcus: Principal | ICD-10-CM | POA: Diagnosis present

## 2021-09-03 DIAGNOSIS — R9431 Abnormal electrocardiogram [ECG] [EKG]: Secondary | ICD-10-CM

## 2021-09-03 DIAGNOSIS — Q549 Hypospadias, unspecified: Secondary | ICD-10-CM | POA: Diagnosis not present

## 2021-09-03 DIAGNOSIS — Z4889 Encounter for other specified surgical aftercare: Secondary | ICD-10-CM | POA: Diagnosis not present

## 2021-09-03 DIAGNOSIS — Z9359 Other cystostomy status: Secondary | ICD-10-CM

## 2021-09-03 DIAGNOSIS — B9689 Other specified bacterial agents as the cause of diseases classified elsewhere: Secondary | ICD-10-CM | POA: Diagnosis not present

## 2021-09-03 DIAGNOSIS — L8990 Pressure ulcer of unspecified site, unspecified stage: Secondary | ICD-10-CM | POA: Diagnosis present

## 2021-09-03 DIAGNOSIS — N210 Calculus in bladder: Secondary | ICD-10-CM | POA: Diagnosis not present

## 2021-09-03 DIAGNOSIS — L89224 Pressure ulcer of left hip, stage 4: Secondary | ICD-10-CM | POA: Diagnosis not present

## 2021-09-03 DIAGNOSIS — E876 Hypokalemia: Secondary | ICD-10-CM | POA: Diagnosis not present

## 2021-09-03 DIAGNOSIS — G8220 Paraplegia, unspecified: Secondary | ICD-10-CM | POA: Diagnosis not present

## 2021-09-03 DIAGNOSIS — E43 Unspecified severe protein-calorie malnutrition: Secondary | ICD-10-CM | POA: Diagnosis not present

## 2021-09-03 DIAGNOSIS — I083 Combined rheumatic disorders of mitral, aortic and tricuspid valves: Secondary | ICD-10-CM | POA: Diagnosis not present

## 2021-09-03 DIAGNOSIS — L89324 Pressure ulcer of left buttock, stage 4: Secondary | ICD-10-CM | POA: Diagnosis not present

## 2021-09-03 DIAGNOSIS — B952 Enterococcus as the cause of diseases classified elsewhere: Secondary | ICD-10-CM | POA: Diagnosis present

## 2021-09-03 DIAGNOSIS — I059 Rheumatic mitral valve disease, unspecified: Secondary | ICD-10-CM | POA: Diagnosis not present

## 2021-09-03 DIAGNOSIS — L89890 Pressure ulcer of other site, unstageable: Secondary | ICD-10-CM | POA: Diagnosis not present

## 2021-09-03 DIAGNOSIS — D638 Anemia in other chronic diseases classified elsewhere: Secondary | ICD-10-CM | POA: Diagnosis present

## 2021-09-03 DIAGNOSIS — M86072 Acute hematogenous osteomyelitis, left ankle and foot: Secondary | ICD-10-CM | POA: Diagnosis not present

## 2021-09-03 DIAGNOSIS — N319 Neuromuscular dysfunction of bladder, unspecified: Secondary | ICD-10-CM | POA: Diagnosis present

## 2021-09-03 DIAGNOSIS — R0789 Other chest pain: Secondary | ICD-10-CM | POA: Diagnosis not present

## 2021-09-03 DIAGNOSIS — A419 Sepsis, unspecified organism: Secondary | ICD-10-CM

## 2021-09-03 DIAGNOSIS — K7200 Acute and subacute hepatic failure without coma: Secondary | ICD-10-CM | POA: Diagnosis not present

## 2021-09-03 DIAGNOSIS — I96 Gangrene, not elsewhere classified: Secondary | ICD-10-CM | POA: Diagnosis present

## 2021-09-03 DIAGNOSIS — D649 Anemia, unspecified: Secondary | ICD-10-CM | POA: Diagnosis not present

## 2021-09-03 DIAGNOSIS — R6521 Severe sepsis with septic shock: Secondary | ICD-10-CM

## 2021-09-03 DIAGNOSIS — Q059 Spina bifida, unspecified: Secondary | ICD-10-CM

## 2021-09-03 DIAGNOSIS — R509 Fever, unspecified: Secondary | ICD-10-CM | POA: Diagnosis not present

## 2021-09-03 DIAGNOSIS — Z7401 Bed confinement status: Secondary | ICD-10-CM | POA: Diagnosis not present

## 2021-09-03 DIAGNOSIS — M869 Osteomyelitis, unspecified: Secondary | ICD-10-CM

## 2021-09-03 DIAGNOSIS — M86652 Other chronic osteomyelitis, left thigh: Secondary | ICD-10-CM | POA: Diagnosis present

## 2021-09-03 DIAGNOSIS — Q057 Lumbar spina bifida without hydrocephalus: Secondary | ICD-10-CM

## 2021-09-03 DIAGNOSIS — Z87891 Personal history of nicotine dependence: Secondary | ICD-10-CM | POA: Diagnosis not present

## 2021-09-03 DIAGNOSIS — M009 Pyogenic arthritis, unspecified: Secondary | ICD-10-CM | POA: Insufficient documentation

## 2021-09-03 DIAGNOSIS — I330 Acute and subacute infective endocarditis: Secondary | ICD-10-CM

## 2021-09-03 DIAGNOSIS — L03116 Cellulitis of left lower limb: Secondary | ICD-10-CM | POA: Diagnosis not present

## 2021-09-03 DIAGNOSIS — F419 Anxiety disorder, unspecified: Secondary | ICD-10-CM

## 2021-09-03 DIAGNOSIS — M86452 Chronic osteomyelitis with draining sinus, left femur: Secondary | ICD-10-CM | POA: Diagnosis not present

## 2021-09-03 DIAGNOSIS — N179 Acute kidney failure, unspecified: Secondary | ICD-10-CM | POA: Diagnosis not present

## 2021-09-03 DIAGNOSIS — T402X5A Adverse effect of other opioids, initial encounter: Secondary | ICD-10-CM | POA: Diagnosis not present

## 2021-09-03 DIAGNOSIS — K6139 Other ischiorectal abscess: Secondary | ICD-10-CM | POA: Diagnosis not present

## 2021-09-03 DIAGNOSIS — R109 Unspecified abdominal pain: Secondary | ICD-10-CM | POA: Diagnosis not present

## 2021-09-03 DIAGNOSIS — I872 Venous insufficiency (chronic) (peripheral): Secondary | ICD-10-CM | POA: Diagnosis not present

## 2021-09-03 DIAGNOSIS — L89522 Pressure ulcer of left ankle, stage 2: Secondary | ICD-10-CM | POA: Diagnosis not present

## 2021-09-03 DIAGNOSIS — R7881 Bacteremia: Secondary | ICD-10-CM | POA: Diagnosis not present

## 2021-09-03 DIAGNOSIS — Q6589 Other specified congenital deformities of hip: Secondary | ICD-10-CM

## 2021-09-03 DIAGNOSIS — M1990 Unspecified osteoarthritis, unspecified site: Secondary | ICD-10-CM | POA: Diagnosis not present

## 2021-09-03 DIAGNOSIS — M00852 Arthritis due to other bacteria, left hip: Secondary | ICD-10-CM | POA: Diagnosis not present

## 2021-09-03 DIAGNOSIS — R079 Chest pain, unspecified: Secondary | ICD-10-CM | POA: Diagnosis not present

## 2021-09-03 DIAGNOSIS — M71052 Abscess of bursa, left hip: Secondary | ICD-10-CM | POA: Diagnosis not present

## 2021-09-03 DIAGNOSIS — R748 Abnormal levels of other serum enzymes: Secondary | ICD-10-CM

## 2021-09-03 DIAGNOSIS — L98418 Non-pressure chronic ulcer of buttock with other specified severity: Secondary | ICD-10-CM | POA: Diagnosis not present

## 2021-09-03 DIAGNOSIS — Z79899 Other long term (current) drug therapy: Secondary | ICD-10-CM | POA: Diagnosis not present

## 2021-09-03 DIAGNOSIS — L8944 Pressure ulcer of contiguous site of back, buttock and hip, stage 4: Secondary | ICD-10-CM | POA: Diagnosis present

## 2021-09-03 DIAGNOSIS — Z993 Dependence on wheelchair: Secondary | ICD-10-CM

## 2021-09-03 DIAGNOSIS — S73005A Unspecified dislocation of left hip, initial encounter: Secondary | ICD-10-CM | POA: Diagnosis not present

## 2021-09-03 DIAGNOSIS — I071 Rheumatic tricuspid insufficiency: Secondary | ICD-10-CM | POA: Diagnosis not present

## 2021-09-03 DIAGNOSIS — L89894 Pressure ulcer of other site, stage 4: Secondary | ICD-10-CM | POA: Diagnosis not present

## 2021-09-03 DIAGNOSIS — L89154 Pressure ulcer of sacral region, stage 4: Secondary | ICD-10-CM | POA: Diagnosis present

## 2021-09-03 DIAGNOSIS — Z20822 Contact with and (suspected) exposure to covid-19: Secondary | ICD-10-CM | POA: Diagnosis present

## 2021-09-03 DIAGNOSIS — F29 Unspecified psychosis not due to a substance or known physiological condition: Secondary | ICD-10-CM | POA: Diagnosis not present

## 2021-09-03 DIAGNOSIS — L89153 Pressure ulcer of sacral region, stage 3: Secondary | ICD-10-CM | POA: Diagnosis not present

## 2021-09-03 DIAGNOSIS — I739 Peripheral vascular disease, unspecified: Secondary | ICD-10-CM | POA: Diagnosis not present

## 2021-09-03 DIAGNOSIS — Z881 Allergy status to other antibiotic agents status: Secondary | ICD-10-CM | POA: Diagnosis not present

## 2021-09-03 DIAGNOSIS — D62 Acute posthemorrhagic anemia: Secondary | ICD-10-CM | POA: Diagnosis not present

## 2021-09-03 DIAGNOSIS — L89310 Pressure ulcer of right buttock, unstageable: Secondary | ICD-10-CM | POA: Diagnosis not present

## 2021-09-03 DIAGNOSIS — R5383 Other fatigue: Secondary | ICD-10-CM | POA: Diagnosis not present

## 2021-09-03 DIAGNOSIS — L89329 Pressure ulcer of left buttock, unspecified stage: Secondary | ICD-10-CM | POA: Diagnosis not present

## 2021-09-03 DIAGNOSIS — L089 Local infection of the skin and subcutaneous tissue, unspecified: Secondary | ICD-10-CM | POA: Diagnosis not present

## 2021-09-03 DIAGNOSIS — E8809 Other disorders of plasma-protein metabolism, not elsewhere classified: Secondary | ICD-10-CM | POA: Diagnosis present

## 2021-09-03 DIAGNOSIS — I493 Ventricular premature depolarization: Secondary | ICD-10-CM | POA: Diagnosis not present

## 2021-09-03 DIAGNOSIS — M162 Bilateral osteoarthritis resulting from hip dysplasia: Secondary | ICD-10-CM | POA: Diagnosis not present

## 2021-09-03 DIAGNOSIS — I959 Hypotension, unspecified: Secondary | ICD-10-CM | POA: Diagnosis not present

## 2021-09-03 DIAGNOSIS — E785 Hyperlipidemia, unspecified: Secondary | ICD-10-CM | POA: Diagnosis present

## 2021-09-03 DIAGNOSIS — S71002A Unspecified open wound, left hip, initial encounter: Secondary | ICD-10-CM | POA: Diagnosis not present

## 2021-09-03 DIAGNOSIS — K5903 Drug induced constipation: Secondary | ICD-10-CM | POA: Diagnosis not present

## 2021-09-03 DIAGNOSIS — Z8249 Family history of ischemic heart disease and other diseases of the circulatory system: Secondary | ICD-10-CM

## 2021-09-03 DIAGNOSIS — Z1621 Resistance to vancomycin: Secondary | ICD-10-CM | POA: Diagnosis not present

## 2021-09-03 HISTORY — DX: Other complications of anesthesia, initial encounter: T88.59XA

## 2021-09-03 LAB — CBC
HCT: 20.9 % — ABNORMAL LOW (ref 39.0–52.0)
Hemoglobin: 6.7 g/dL — CL (ref 13.0–17.0)
MCH: 26.1 pg (ref 26.0–34.0)
MCHC: 32.1 g/dL (ref 30.0–36.0)
MCV: 81.3 fL (ref 80.0–100.0)
Platelets: 342 10*3/uL (ref 150–400)
RBC: 2.57 MIL/uL — ABNORMAL LOW (ref 4.22–5.81)
RDW: 14.7 % (ref 11.5–15.5)
WBC: 18.6 10*3/uL — ABNORMAL HIGH (ref 4.0–10.5)
nRBC: 0 % (ref 0.0–0.2)

## 2021-09-03 LAB — CBC WITH DIFFERENTIAL/PLATELET
Abs Immature Granulocytes: 0.2 10*3/uL — ABNORMAL HIGH (ref 0.00–0.07)
Basophils Absolute: 0.1 10*3/uL (ref 0.0–0.1)
Basophils Relative: 0 %
Eosinophils Absolute: 0 10*3/uL (ref 0.0–0.5)
Eosinophils Relative: 0 %
HCT: 31.9 % — ABNORMAL LOW (ref 39.0–52.0)
Hemoglobin: 10.3 g/dL — ABNORMAL LOW (ref 13.0–17.0)
Immature Granulocytes: 1 %
Lymphocytes Relative: 3 %
Lymphs Abs: 0.7 10*3/uL (ref 0.7–4.0)
MCH: 25.9 pg — ABNORMAL LOW (ref 26.0–34.0)
MCHC: 32.3 g/dL (ref 30.0–36.0)
MCV: 80.2 fL (ref 80.0–100.0)
Monocytes Absolute: 1.2 10*3/uL — ABNORMAL HIGH (ref 0.1–1.0)
Monocytes Relative: 5 %
Neutro Abs: 19.9 10*3/uL — ABNORMAL HIGH (ref 1.7–7.7)
Neutrophils Relative %: 91 %
Platelets: 441 10*3/uL — ABNORMAL HIGH (ref 150–400)
RBC: 3.98 MIL/uL — ABNORMAL LOW (ref 4.22–5.81)
RDW: 14.6 % (ref 11.5–15.5)
WBC: 22 10*3/uL — ABNORMAL HIGH (ref 4.0–10.5)
nRBC: 0 % (ref 0.0–0.2)

## 2021-09-03 LAB — URINALYSIS, ROUTINE W REFLEX MICROSCOPIC
Glucose, UA: NEGATIVE mg/dL
Ketones, ur: NEGATIVE mg/dL
Nitrite: POSITIVE — AB
Protein, ur: 100 mg/dL — AB
Specific Gravity, Urine: >1.03 — ABNORMAL HIGH (ref 1.005–1.030)
pH: 5.5 (ref 5.0–8.0)

## 2021-09-03 LAB — COMPREHENSIVE METABOLIC PANEL
ALT: 68 U/L — ABNORMAL HIGH (ref 0–44)
AST: 74 U/L — ABNORMAL HIGH (ref 15–41)
Albumin: 1.9 g/dL — ABNORMAL LOW (ref 3.5–5.0)
Alkaline Phosphatase: 105 U/L (ref 38–126)
Anion gap: 19 — ABNORMAL HIGH (ref 5–15)
BUN: 21 mg/dL — ABNORMAL HIGH (ref 6–20)
CO2: 21 mmol/L — ABNORMAL LOW (ref 22–32)
Calcium: 8.2 mg/dL — ABNORMAL LOW (ref 8.9–10.3)
Chloride: 94 mmol/L — ABNORMAL LOW (ref 98–111)
Creatinine, Ser: 2.34 mg/dL — ABNORMAL HIGH (ref 0.61–1.24)
GFR, Estimated: 33 mL/min — ABNORMAL LOW (ref >60)
Glucose, Bld: 168 mg/dL — ABNORMAL HIGH (ref 70–99)
Potassium: 2.7 mmol/L — CL (ref 3.5–5.1)
Sodium: 134 mmol/L — ABNORMAL LOW (ref 135–145)
Total Bilirubin: 0.7 mg/dL (ref 0.3–1.2)
Total Protein: 7.4 g/dL (ref 6.5–8.1)

## 2021-09-03 LAB — APTT: aPTT: 38 seconds — ABNORMAL HIGH (ref 24–36)

## 2021-09-03 LAB — URINALYSIS, MICROSCOPIC (REFLEX): WBC, UA: >50 WBC/hpf (ref 0–5)

## 2021-09-03 LAB — PROTIME-INR
INR: 1.6 — ABNORMAL HIGH (ref 0.8–1.2)
Prothrombin Time: 18.8 seconds — ABNORMAL HIGH (ref 11.4–15.2)

## 2021-09-03 LAB — LACTIC ACID, PLASMA
Lactic Acid, Venous: 3.8 mmol/L (ref 0.5–1.9)
Lactic Acid, Venous: 1.7 mmol/L (ref 0.5–1.9)

## 2021-09-03 LAB — MAGNESIUM: Magnesium: 1.3 mg/dL — ABNORMAL LOW (ref 1.7–2.4)

## 2021-09-03 LAB — RESP PANEL BY RT-PCR (FLU A&B, COVID) ARPGX2
Influenza A by PCR: NEGATIVE
Influenza B by PCR: NEGATIVE
SARS Coronavirus 2 by RT PCR: NEGATIVE

## 2021-09-03 LAB — CREATININE, SERUM
Creatinine, Ser: 1.57 mg/dL — ABNORMAL HIGH (ref 0.61–1.24)
GFR, Estimated: 53 mL/min — ABNORMAL LOW (ref >60)

## 2021-09-03 MED ORDER — IBUPROFEN 800 MG PO TABS
800.0000 mg | ORAL_TABLET | Freq: Once | ORAL | Status: AC
Start: 2021-09-03 — End: 2021-09-03
  Administered 2021-09-03: 800 mg via ORAL
  Filled 2021-09-03: qty 1

## 2021-09-03 MED ORDER — ALBUMIN HUMAN 5 % IV SOLN
25.0000 g | Freq: Once | INTRAVENOUS | Status: AC
  Administered 2021-09-04: 25 g via INTRAVENOUS
  Filled 2021-09-03: qty 500

## 2021-09-03 MED ORDER — LACTATED RINGERS IV SOLN: INTRAVENOUS | Status: AC

## 2021-09-03 MED ORDER — SODIUM CHLORIDE 0.9 % IV SOLN
680.0000 mg | INTRAVENOUS | Status: DC
  Administered 2021-09-04: 700 mg via INTRAVENOUS
  Filled 2021-09-03: qty 14

## 2021-09-03 MED ORDER — FLECAINIDE ACETATE 50 MG PO TABS: 50.0000 mg | ORAL_TABLET | Freq: Two times a day (BID) | ORAL | Status: DC

## 2021-09-03 MED ORDER — LACTATED RINGERS IV BOLUS (SEPSIS)
1000.0000 mL | Freq: Once | INTRAVENOUS | Status: AC
  Administered 2021-09-03: 1000 mL via INTRAVENOUS

## 2021-09-03 MED ORDER — ROSUVASTATIN CALCIUM 20 MG PO TABS
40.0000 mg | ORAL_TABLET | Freq: Every day | ORAL | Status: DC
  Administered 2021-09-03 – 2021-09-23 (×21): 40 mg via ORAL
  Filled 2021-09-03 (×21): qty 2

## 2021-09-03 MED ORDER — METOPROLOL SUCCINATE ER 25 MG PO TB24: 50.0000 mg | ORAL_TABLET | Freq: Two times a day (BID) | ORAL | Status: DC

## 2021-09-03 MED ORDER — ACETAMINOPHEN 325 MG PO TABS
650.0000 mg | ORAL_TABLET | Freq: Four times a day (QID) | ORAL | Status: DC | PRN
  Administered 2021-09-04: 650 mg via ORAL
  Filled 2021-09-03: qty 2

## 2021-09-03 MED ORDER — PROCHLORPERAZINE MALEATE 5 MG PO TABS
5.0000 mg | ORAL_TABLET | Freq: Four times a day (QID) | ORAL | Status: DC | PRN
  Administered 2021-09-07 – 2021-09-11 (×6): 5 mg via ORAL
  Filled 2021-09-03 (×8): qty 1

## 2021-09-03 MED ORDER — ACETAMINOPHEN 500 MG PO TABS
1000.0000 mg | ORAL_TABLET | Freq: Once | ORAL | Status: DC
Start: 2021-09-03 — End: 2021-09-03

## 2021-09-03 MED ORDER — ACETAMINOPHEN 650 MG RE SUPP: 650.0000 mg | Freq: Four times a day (QID) | RECTAL | Status: DC | PRN

## 2021-09-03 MED ORDER — HEPARIN SODIUM (PORCINE) 5000 UNIT/ML IJ SOLN
5000.0000 [IU] | Freq: Three times a day (TID) | INTRAMUSCULAR | Status: DC
  Administered 2021-09-03 – 2021-09-05 (×5): 5000 [IU] via SUBCUTANEOUS
  Filled 2021-09-03 (×5): qty 1

## 2021-09-03 MED ORDER — POTASSIUM CHLORIDE 10 MEQ/100ML IV SOLN
10.0000 meq | INTRAVENOUS | Status: AC
  Administered 2021-09-03 (×3): 10 meq via INTRAVENOUS
  Filled 2021-09-03 (×3): qty 100

## 2021-09-03 MED ORDER — LACTATED RINGERS IV BOLUS
1000.0000 mL | Freq: Once | INTRAVENOUS | Status: AC
  Administered 2021-09-03: 1000 mL via INTRAVENOUS

## 2021-09-03 MED ORDER — LACTATED RINGERS IV BOLUS
1000.0000 mL | Freq: Once | INTRAVENOUS | Status: AC
Start: 2021-09-03 — End: 2021-09-04
  Administered 2021-09-04: 1000 mL via INTRAVENOUS

## 2021-09-03 MED ORDER — ONDANSETRON HCL 4 MG/2ML IJ SOLN
4.0000 mg | Freq: Once | INTRAMUSCULAR | Status: AC
  Administered 2021-09-03: 4 mg via INTRAVENOUS
  Filled 2021-09-03: qty 2

## 2021-09-03 MED ORDER — ACETAMINOPHEN 500 MG PO TABS
1000.0000 mg | ORAL_TABLET | Freq: Once | ORAL | Status: AC
  Administered 2021-09-03: 1000 mg via ORAL
  Filled 2021-09-03: qty 2

## 2021-09-03 MED ORDER — LISINOPRIL-HYDROCHLOROTHIAZIDE 20-25 MG PO TABS: 1.0000 | ORAL_TABLET | Freq: Every day | ORAL | Status: DC

## 2021-09-03 MED ORDER — FAMOTIDINE 20 MG PO TABS
10.0000 mg | ORAL_TABLET | Freq: Every day | ORAL | Status: DC | PRN
  Administered 2021-09-04: 10 mg via ORAL
  Filled 2021-09-03 (×2): qty 1

## 2021-09-03 MED ORDER — SODIUM CHLORIDE 0.9 % IV SOLN
2.0000 g | Freq: Once | INTRAVENOUS | Status: AC
  Administered 2021-09-03: 2 g via INTRAVENOUS
  Filled 2021-09-03 (×2): qty 20

## 2021-09-03 MED ORDER — POTASSIUM CHLORIDE CRYS ER 20 MEQ PO TBCR
40.0000 meq | EXTENDED_RELEASE_TABLET | Freq: Once | ORAL | Status: AC
  Administered 2021-09-03: 40 meq via ORAL
  Filled 2021-09-03: qty 2

## 2021-09-03 NOTE — Progress Notes (Signed)
FMTS Interim Progress Note  S: Went to evaluate patient at bedside.  Critical care provider Dr. Daphane Shepherd also at bedside performed bedside point-of-care ultrasound.  Patient has received 4 L of fluid boluses and is still hypotensive.  Overall patient reports he feels better.  Last BP 95/52 with map of 64.  O: BP (!) 75/46   Pulse (!) 107   Temp (!) 102.8 F (39.3 C) (Oral)   Resp 12   SpO2 100%   General: Laying in bed, no acute distress Cardiovascular: Tachycardic, regular rhythm, 2+ radial pulses Pulmonary: Clear to auscultation bilaterally, breathing comfortably on room air Abdomen: Soft, nontender Extremities: Warm and well perfused  A/P: Severe sepsis secondary to infection of decubitus ulcer Still hypotensive despite 4 L of fluid boluses, fifth liter of fluid is ordered and ongoing.  Discussed with critical care provider, he can tolerate more fluids due to IVC variability and no fluid seen in the lungs on ultrasound. Plan to reassess after next fluid bolus and will reach out to critical care if MAP is less than 65 for possible initiation of vasopressor support.  Littie Deeds, MD 09/03/2021, 10:29 PM PGY-3, [facility] Family Medicine Service pager [phone]

## 2021-09-03 NOTE — Sepsis Progress Note (Signed)
Sepsis protocol monitored by eLink 

## 2021-09-03 NOTE — Assessment & Plan Note (Addendum)
Resolved. Cr. 0.84. Creatinine elevated to 2.34 on admission, baseline of 0.5-0.6 as observed from labs over the last 2 months. Was likely pre-renal due to sepsis. - Avoid nephrotoxic agents

## 2021-09-03 NOTE — Assessment & Plan Note (Addendum)
Hypotensive on admission, related to severe sepsis. Hold home lisinopril given soft pressures

## 2021-09-03 NOTE — Progress Notes (Signed)
Based on wound purulence I think patient requires MRSA coverage.  He has a allergy to vancomycin.  Plan to order linezolid however notified in order that needs approval by ID.  I briefly discussed case with ID provider on-call Dr. Earlene Plater who agreed with MRSA coverage, recommended daptomycin which can be placed as pharmacy consult (order has been placed).

## 2021-09-03 NOTE — ED Triage Notes (Signed)
Pt here with reports of fevers X1 week along with nausea and diarrhea and infection to urethra.

## 2021-09-03 NOTE — ED Provider Triage Note (Signed)
Emergency Medicine Provider Triage Evaluation Note  Anthony [name] , a 51 y.o. male  was evaluated in triage.  Pt complains of wound.  Patient has a purulent draining wound underneath his urethra.  He was febrile today, sweaty.  Feels generally unwell and came to ED.Marland Kitchen  Review of Systems  Per HPI  Physical Exam  BP (!) 97/59 (BP Location: Right Arm)   Pulse (!) 127   Temp (!) 101.6 F (38.7 C) (Oral)   Resp (!) 22   SpO2 99%  Gen:   Awake, ill appearing Resp:  Normal effort  MSK:   Moves extremities without difficulty  Other:  Ill-appearing, tachycardic and febrile.  Genital exam deferred while in triage.  Medical Decision Making  Medically screening exam initiated at 3:38 PM.  Appropriate orders placed.  [name] was informed that the remainder of the evaluation will be completed by another provider, this initial triage assessment does not replace that evaluation, and the importance of remaining in the ED until their evaluation is complete.  Code sepsis activated, patient had Tylenol 2 hours prior to arrival also Motrin ordered.   Theron Arista, PA-C 09/03/21 1539

## 2021-09-03 NOTE — H&P (Cosign Needed)
Hospital Admission History and Physical Service Pager: [phone]  Patient name: Anthony [name] Medical record number: [id number] Date of Birth: 15-Aug-1970 Age: 51 y.o. Gender: male  Primary Care Provider: Precious Gilding, DO Consultants: Gen Surg, Ortho Code Status: FULL Preferred Emergency Contact: Anthony [name], mother Contact Information     Name Relation Home Work Mobile   [name],Anthony Mother [phone]          Chief Complaint: Fever and chills with worsening drainage from chronic sacral wound.  Assessment and Plan: Anthony [name] is a 51 y.o. male presenting with fever, hypotension, tachypnea, hypotension and lactic acidosis meeting SIRS criteria for severe sepsis.  Hypotension is currently responding to fluid resuscitation.  Differential for presentation of this includes severe sepsis versus septic shock secondary to stage IV decubitus ulcer, urinary tract infection, possible left hip septic arthritis (see CT for more information). * Severe sepsis (HCC) SIRS criteria for severe sepsis met including hypotension and lactic acidosis.  Hypotension improved after 3 L of LR, however pressure dropped again and he received 1 additional liter of LR.  Patient has multiple wounds (see pressure ulcer below for more details), however purulent stage IV decubitus ulcer on left buttocks is a likely source of infection. - Admitted to FMTS, attending Dr. Owens Shark - General surgery consult, does not recommend debridement as time - Orthopedic consulted, and did not see need to intervene at this time - Status post 4 L LR bolus - IV ceftriaxone, daptomycin for MRSA coverage - UA and microscopic suggestive of urinary tract infection.  UTI versus asymptomatic bacteremia as patient has chronic indwelling suprapubic catheter. - CT abdomen and pelvis showed unchanged large decubitus ulcer from previous exam.  However new air within the left hip joint was noted and is concerning for possible septic arthritis. - If  hypotension does not resolve after fourth bolus, consult CCM for higher level of care.  Hypokalemia Potassium of 2.7 at admission. - Repleted with IV potassium - Pending magnesium  Prolonged Q-T interval on ECG QTc of 550 on most recent ECG.  On flecainide, known to cause prolongation. - Hold home flecainide - Avoid QTc prolonging agents  AKI (acute kidney injury) ([place]) Creatinine elevated to 2.34, baseline of 0.5-0.6 as observed from labs over the last 2 months.  eGFR of 33 at admission, baseline greater than 60. - S/P 4 L LR - Hold home flecainide due to partial renal elimination - Consider renal medications - Avoid nephrotoxic agents  Pressure ulcer Chronic stage IV pressure ulcer on left buttock, stage III and stage II ulcers present in sacral region.  Additional wound on dorsum of right foot noted. Not followed by wound care. - Stage IV pressure ulcer is likely source of sepsis - Wound care consult - See severe sepsis above for more details  Essential hypertension Hypotensive on admission, related to severe sepsis.  - Hold home lisinopril - Status post 4 L LR    FEN/GI: Regular VTE Prophylaxis: Heparin due to AKI  Disposition: Progressive, pending clinical improvement  History of Present Illness:  Anthony [name] is a 51 y.o. male presenting with wound infection.  Patient reports he has had intermittent fevers as high as 102F and chills for the past 1.5 weeks. 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 He has a chronic suprapubic catheter which he changes every month, last changed 2 weeks ago.  Does not follow with wound care - he has been caring for his wounds on his own. He reports he also has an ulcer on his right foot that has been present for 1-2 years.  In the ED,  patient presented with fever, tachypnea, tachycardia, hypotension and lactic acidosis meeting severe sepsis criteria.  Code sepsis was called.  Source of infection is likely stage IV decubitus sacral ulcer.  He received 3 L bolus lactated Ringer's, and hypotension improved.  Patient received first dose of ceftriaxone IV.  Hypokalemia was present, and repleted.  General surgery was consulted and recommended starting antibiotics and they will see him later tonight.  Ortho was consulted, and may see patient pending general surgery's recommendations.  Review Of Systems: Per HPI with the following additions:  Review of Systems  Constitutional:  Positive for chills, fever and malaise/fatigue.  Respiratory:  Positive for cough (only with chills).   Cardiovascular:  Negative for chest pain and palpitations.  Gastrointestinal:  Positive for diarrhea and nausea.     Pertinent Past Medical History: Spina bifida HTN Paraplegia Chronic osteomyelitis Chronic suprapubic catheter PVCs Remainder reviewed in history tab.   Pertinent Past Surgical History: No recent surgeries  Remainder reviewed in history tab.  Pertinent Social History: Tobacco use: No Alcohol use: No Other Substance use: denies Lives with self. Has been trying to get an aide but makes too much money apparently.  Pertinent Family History: CAD in father Sudden cardiac death in sister  Remainder reviewed in history tab.   Important Outpatient Medications: Lisinopril-HCTZ 20-25 mg once daily Metoprolol succinate 50 mg Flecainide 50 mg BID Potassium 99 mg BID Fish oil BID Rosuvastatin 40 mg Multivitamin Took his morning medications today  Remainder reviewed in medication history.   Objective: BP (!) 75/46   Pulse (!) 107   Temp (!) 102.8 F (39.3 C) (Oral)   Resp 12   SpO2 100%  Exam: General: Ill-appearing, not in acute distress. Cardiovascular: Regular rate and rhythm and no murmurs rubs or gallops Respiratory:  CTAB Gastrointestinal: Bowel sounds present, nontender, nondistended. MSK: Wound on dorsum of right foot without drainage.  Stage IV sacral decubitus ulcer on the left with purulent drainage.  2 other wounds present in sacral region see media. Neuro: Alert and oriented x 3 Psych: Mood and behavior normal  Labs:  CBC BMET  Recent Labs  Lab 09/03/21 1618  WBC 22.0*  HGB 10.3*  HCT 31.9*  PLT 441*   Recent Labs  Lab 09/03/21 1618  NA 134*  K 2.7*  CL 94*  CO2 21*  BUN 21*  CREATININE 2.34*  GLUCOSE 168*  CALCIUM 8.2*    Pertinent additional labs: UA positive for hemoglobin large, nitrates, leukocytes.  Urinalysis scopic shows bacteria many.    EKG: Sinus tachycardia, QT not prolonged.  No acute ischemia.   Imaging Studies Performed:  Chest x-ray Impression from Radiologist: No active disease. My Interpretation: No acute cardiopulmonary disease, no previous x-ray to compare.  CT abdomen/pelvis without contrast Impression from radiologist: Multiple large bladder stones are noted.Continued presence of large decubitus ulcer seen involving the left buttocks which is not significantly changed compared to prior exam. It is seen to extend to the base of the penis. - Now noted air within the left hip joint concerning for possible septic arthritis.   Leslie Dales, MD 09/03/2021, 10:30 PM PGY-1, [place]  [place] Intern pager: [phone], text pages welcome Secure chat group [place]

## 2021-09-03 NOTE — Sepsis Progress Note (Signed)
Notified bedside nurse of need to draw repeat lactic acid. 

## 2021-09-03 NOTE — Consult Note (Signed)
Consulting Physician: Hyman Hopes Kaylon Hitz  Referring Provider: Chaney Malling, MD - ER Provider  Chief Complaint: Decubitus ulcer  Reason for Consult: Decubitus ulcer   Subjective   HPI: Anthony [name] is an 51 y.o. male with spina bifida and paraplegia, who is here for fever and adominal pain.  He has a decubitus ulcer, last debrided in 2019, by Dr. Donell Beers.  Every now and then he develops drainage issues.  Recently he has noticed a new area.  He also has had drainage, fever, abdominal pain.  So, he presented to the ER.  Past Medical History:  Diagnosis Date   Chronic indwelling Foley catheter    PVCs (premature ventricular contractions)    Spina bifida    Wheelchair bound     Past Surgical History:  Procedure Laterality Date   BACK SURGERY     HIP SURGERY     INCISION AND DRAINAGE OF WOUND N/A 01/24/2017   Procedure: IRRIGATION AND DEBRIDEMENT WOUND- BUTTOCK ABSCESS;  Surgeon: Almond Lint, MD;  Location: [facility];  Service: General;  Laterality: N/A;    Family History  Problem Relation Age of Onset   CAD Father        died age 44 from MI   Sudden Cardiac Death Sister        age 30   CAD Maternal Grandfather        died age 73 MI    Social:  reports that he has never smoked. He has never used smokeless tobacco. He reports that he does not drink alcohol. No history on file for drug use.  Allergies:  Allergies  Allergen Reactions   Vancomycin Itching    Medications: Current Outpatient Medications  Medication Instructions   Catheters (FOLEY CATHETER 2-WAY) MISC Use as directed by urologist.   flecainide (TAMBOCOR) 50 mg, Oral, 2 times daily   lisinopril-hydrochlorothiazide (ZESTORETIC) 20-25 MG tablet Take 1 tablet by mouth once daily   metoprolol succinate (TOPROL-XL) 50 mg, Oral, 2 times daily, Take with or immediately following a meal.   Multiple Vitamin (MULTIVITAMIN WITH MINERALS) TABS tablet 1 tablet, Oral, Daily   Omega-3 Fatty Acids (FISH OIL) 1000 MG CAPS 1  capsule, Oral, 2 times daily   Potassium 99 MG TABS 1 tablet, Oral, 2 times daily   rosuvastatin (CRESTOR) 40 mg, Oral, Daily    ROS - all of the below systems have been reviewed with the patient and positives are indicated with bold text General: chills, fever or night sweats Eyes: blurry vision or double vision ENT: epistaxis or sore throat Allergy/Immunology: itchy/watery eyes or nasal congestion Hematologic/Lymphatic: bleeding problems, blood clots or swollen lymph nodes Endocrine: temperature intolerance or unexpected weight changes Breast: new or changing breast lumps or nipple discharge Resp: cough, shortness of breath, or wheezing CV: chest pain or dyspnea on exertion GI: as per HPI GU: dysuria, trouble voiding, or hematuria MSK: joint pain or joint stiffness Neuro: TIA or stroke symptoms Derm: pruritus and skin lesion changes Psych: anxiety and depression  Objective   PE Blood pressure 112/71, pulse 88, temperature 98.7 F (37.1 C), temperature source Oral, resp. rate (!) 9, SpO2 99 %. Constitutional: NAD; conversant; no deformities Eyes: Moist conjunctiva; no lid lag; anicteric; PERRL Neck: Trachea midline; no thyromegaly Lungs: Normal respiratory effort; no tactile fremitus CV: RRR; no palpable thrills; no pitting edema GI: Abd Soft, nontender; no palpable hepatosplenomegaly MSK: paraplegia, upper extremity strength intact Psychiatric: Appropriate affect; alert and oriented x3 Lymphatic: No palpable cervical or axillary  lymphadenopathy  Buttock:      Results for orders placed or performed during the hospital encounter of 09/03/21 (from the past 24 hour(s))  Resp Panel by RT-PCR (Flu A&B, Covid) Anterior Nasal Swab     Status: None   Collection Time: 09/03/21  3:36 PM   Specimen: Anterior Nasal Swab  Result Value Ref Range   SARS Coronavirus 2 by RT PCR NEGATIVE NEGATIVE   Influenza A by PCR NEGATIVE NEGATIVE   Influenza B by PCR NEGATIVE NEGATIVE  Lactic  acid, plasma     Status: Abnormal   Collection Time: 09/03/21  4:18 PM  Result Value Ref Range   Lactic Acid, Venous 3.8 (HH) 0.5 - 1.9 mmol/L  Comprehensive metabolic panel     Status: Abnormal   Collection Time: 09/03/21  4:18 PM  Result Value Ref Range   Sodium 134 (L) 135 - 145 mmol/L   Potassium 2.7 (LL) 3.5 - 5.1 mmol/L   Chloride 94 (L) 98 - 111 mmol/L   CO2 21 (L) 22 - 32 mmol/L   Glucose, Bld 168 (H) 70 - 99 mg/dL   BUN 21 (H) 6 - 20 mg/dL   Creatinine, Ser 2.34 (H) 0.61 - 1.24 mg/dL   Calcium 8.2 (L) 8.9 - 10.3 mg/dL   Total Protein 7.4 6.5 - 8.1 g/dL   Albumin 1.9 (L) 3.5 - 5.0 g/dL   AST 74 (H) 15 - 41 U/L   ALT 68 (H) 0 - 44 U/L   Alkaline Phosphatase 105 38 - 126 U/L   Total Bilirubin 0.7 0.3 - 1.2 mg/dL   GFR, Estimated 33 (L) >60 mL/min   Anion gap 19 (H) 5 - 15  CBC with Differential     Status: Abnormal   Collection Time: 09/03/21  4:18 PM  Result Value Ref Range   WBC 22.0 (H) 4.0 - 10.5 K/uL   RBC 3.98 (L) 4.22 - 5.81 MIL/uL   Hemoglobin 10.3 (L) 13.0 - 17.0 g/dL   HCT 31.9 (L) 39.0 - 52.0 %   MCV 80.2 80.0 - 100.0 fL   MCH 25.9 (L) 26.0 - 34.0 pg   MCHC 32.3 30.0 - 36.0 g/dL   RDW 14.6 11.5 - 15.5 %   Platelets 441 (H) 150 - 400 K/uL   nRBC 0.0 0.0 - 0.2 %   Neutrophils Relative % 91 %   Neutro Abs 19.9 (H) 1.7 - 7.7 K/uL   Lymphocytes Relative 3 %   Lymphs Abs 0.7 0.7 - 4.0 K/uL   Monocytes Relative 5 %   Monocytes Absolute 1.2 (H) 0.1 - 1.0 K/uL   Eosinophils Relative 0 %   Eosinophils Absolute 0.0 0.0 - 0.5 K/uL   Basophils Relative 0 %   Basophils Absolute 0.1 0.0 - 0.1 K/uL   Immature Granulocytes 1 %   Abs Immature Granulocytes 0.20 (H) 0.00 - 0.07 K/uL  Protime-INR     Status: Abnormal   Collection Time: 09/03/21  4:18 PM  Result Value Ref Range   Prothrombin Time 18.8 (H) 11.4 - 15.2 seconds   INR 1.6 (H) 0.8 - 1.2  APTT     Status: Abnormal   Collection Time: 09/03/21  4:18 PM  Result Value Ref Range   aPTT 38 (H) 24 - 36 seconds   Urinalysis, Routine w reflex microscopic     Status: Abnormal   Collection Time: 09/03/21  7:01 PM  Result Value Ref Range   Color, Urine YELLOW YELLOW   APPearance CLOUDY (A)  CLEAR   Specific Gravity, Urine >1.030 (H) 1.005 - 1.030   pH 5.5 5.0 - 8.0   Glucose, UA NEGATIVE NEGATIVE mg/dL   Hgb urine dipstick LARGE (A) NEGATIVE   Bilirubin Urine SMALL (A) NEGATIVE   Ketones, ur NEGATIVE NEGATIVE mg/dL   Protein, ur 378 (A) NEGATIVE mg/dL   Nitrite POSITIVE (A) NEGATIVE   Leukocytes,Ua MODERATE (A) NEGATIVE  Urinalysis, Microscopic (reflex)     Status: Abnormal   Collection Time: 09/03/21  7:01 PM  Result Value Ref Range   RBC / HPF 21-50 0 - 5 RBC/hpf   WBC, UA >50 0 - 5 WBC/hpf   Bacteria, UA MANY (A) NONE SEEN   Squamous Epithelial / LPF 6-10 0 - 5   WBC Clumps PRESENT    Mucus PRESENT    Hyaline Casts, UA PRESENT   Lactic acid, plasma     Status: None   Collection Time: 09/03/21  7:37 PM  Result Value Ref Range   Lactic Acid, Venous 1.7 0.5 - 1.9 mmol/L     Imaging Orders         DG Chest Port 1 View         CT ABDOMEN PELVIS WO CONTRAST      Assessment and Plan   Anthony [name] is an 51 y.o. male with paraplegia from spina bifida with chronic ischiorectal abscess.  It was debrided by Dr. Donell Beers in 2019.  Every so often he has some drainage issues in this area.  Recently a nearby area developed and he has developed fevers and chills.  CT now with concerns for infection involving the wound and left hip.  Based on imaging and exam, it does not appear he would benefit from soft tissue debridement, certainly no emergent needs tonight.  The surgery team will follow, and follow up on orthopedics recommendations.  He is being admitted to the medicine service for antibiotics and resuscitation.      ICD-10-CM   1. Osteomyelitis, unspecified site, unspecified type (HCC)  M86.9     2. Hypokalemia  E87.6        Quentin Ore, MD  [facility] Use  [email or web address] to contact on call provider  New Patient Billing: [id number] - Moderate MDM

## 2021-09-03 NOTE — Assessment & Plan Note (Addendum)
Stable. Had prolonged Q-T interval on ECG on admission and flecainide was held due to it QT prolonging potential. However, patient with PVCs. Cardiology curbsided, recommended continuing monitor QTc on telemetry and restarting flecainide.  -continue flecainide  -continue cardiac telemetry to monitor

## 2021-09-03 NOTE — Assessment & Plan Note (Addendum)
Potassium of 2.7 at admission. - Repleted with IV potassium (10 mEq x3) and PO potassium (40 mEq) in the ED - Pending magnesium

## 2021-09-03 NOTE — Progress Notes (Signed)
Pharmacy Antibiotic Note  Anthony [name] is a 51 y.o. male admitted on 09/03/2021 presenting with fever, decubitus ulcer with drainage.  Pharmacy has been consulted for daptomycin dosing.  BL SCr ~0.6, SCr 2.34 on admission  Plan: Daptomycin 8mg /kg q 48h Monitor renal function, CK, ID recs and LOT     Temp (24hrs), Avg:101 F (38.3 C), Min:98.7 F (37.1 C), Max:102.8 F (39.3 C)  Recent Labs  Lab 09/03/21 1618 09/03/21 1937  WBC 22.0*  --   CREATININE 2.34*  --   LATICACIDVEN 3.8* 1.7    CrCl cannot be calculated (Unknown ideal weight.).    Allergies  Allergen Reactions   Vancomycin Itching    09/05/21, PharmD Clinical Pharmacist ED Pharmacist Phone # [phone] 09/03/2021 10:12 PM

## 2021-09-03 NOTE — ED Notes (Signed)
Pt noted to by hypotensive and febrile with a temprature of 102.8. EDP ordered 1L bolus and tylenol. Medications given and Dr. Manson Passey informed.

## 2021-09-03 NOTE — ED Provider Notes (Addendum)
[facility] EMERGENCY DEPARTMENT Provider Note   CSN: [id number] Arrival date & time: 09/03/21  1411     History  Chief Complaint  Patient presents with   Code Sepsis    Anthony [name] is a 50 y.o. male history of recurrent decub ulcer, chronic osteomyelitis, suprapubic catheter here presenting with fever abdominal pain and purulent drainage.  Patient has chronic sacral decub ulcer and has spina bifida.  Patient has been doing well until about a week ago.  He states that he noticed worsening purulent drainage of the left sacral decub ulcer.  He also has been running persistent fevers.  He states that his fever is around 101 at home.  Patient went to triage she was noted to be hypotensive and tachycardic and febrile.  Code sepsis was initiated and 30 cc/kg bolus was ordered and also broad-spectrum antibiotics.  Patient states that he changes his suprapubic catheter every month and last changed about 2 weeks ago.  The history is provided by the patient.       Home Medications Prior to Admission medications   Medication Sig Start Date End Date Taking? Authorizing Provider  Catheters (FOLEY CATHETER 2-WAY) MISC Use as directed by urologist. 12/30/12   Leona Singleton, MD  flecainide (TAMBOCOR) 50 MG tablet Take 1 tablet (50 mg total) by mouth 2 (two) times daily. 05/20/21   Camnitz, Andree Coss, MD  lisinopril-hydrochlorothiazide (ZESTORETIC) 20-25 MG tablet Take 1 tablet by mouth once daily 06/27/21   Quintella Reichert, MD  metoprolol succinate (TOPROL-XL) 50 MG 24 hr tablet Take 1 tablet (50 mg total) by mouth 2 (two) times daily. Take with or immediately following a meal. 04/26/21   Turner, Cornelious Bryant, MD  Multiple Vitamin (MULTIVITAMIN WITH MINERALS) TABS tablet Take 1 tablet by mouth daily.    [provider]  Omega-3 Fatty Acids (FISH OIL) 1000 MG CAPS Take 1 capsule by mouth 2 (two) times daily.    [provider]  Potassium 99 MG TABS Take 1 tablet by  mouth 2 (two) times daily.    [provider]  rosuvastatin (CRESTOR) 40 MG tablet Take 1 tablet (40 mg total) by mouth daily. 04/29/21   Quintella Reichert, MD      Allergies    Vancomycin    Review of Systems   Review of Systems  Constitutional:  Positive for fever.  Gastrointestinal:  Positive for nausea and vomiting.  Skin:  Positive for wound.  All other systems reviewed and are negative.   Physical Exam Updated Vital Signs BP 106/65   Pulse 86   Temp 98.7 F (37.1 C) (Oral)   Resp 14   SpO2 97%  Physical Exam Vitals and nursing note reviewed.  Constitutional:      Comments: Chronically ill  HENT:     Head: Normocephalic.     Nose: Nose normal.     Mouth/Throat:     Mouth: Mucous membranes are dry.  Eyes:     Extraocular Movements: Extraocular movements intact.     Pupils: Pupils are equal, round, and reactive to light.  Cardiovascular:     Rate and Rhythm: Regular rhythm. Tachycardia present.     Pulses: Normal pulses.     Heart sounds: Normal heart sounds.  Pulmonary:     Effort: Pulmonary effort is normal.     Breath sounds: Normal breath sounds.  Abdominal:     General: Abdomen is flat.     Palpations: Abdomen is soft.  Genitourinary:    Comments: Suprapubic catheter in place  Musculoskeletal:     Cervical back: Normal range of motion and neck supple.     Comments: Patient has stage IV sacral decub ulcer on the left hip with purulent drainage.  Another stage III next to it.  Also another stage II.  Please see media  Skin:    Capillary Refill: Capillary refill takes less than 2 seconds.  Neurological:     General: No focal deficit present.     Mental Status: He is oriented to person, place, and time.  Psychiatric:        Mood and Affect: Mood normal.        Behavior: Behavior normal.     ED Results / Procedures / Treatments   Labs (all labs ordered are listed, but only abnormal results are displayed) Labs Reviewed  LACTIC ACID, PLASMA -  Abnormal; Notable for the following components:      Result Value   Lactic Acid, Venous 3.8 (*)    All other components within normal limits  COMPREHENSIVE METABOLIC PANEL - Abnormal; Notable for the following components:   Sodium 134 (*)    Potassium 2.7 (*)    Chloride 94 (*)    CO2 21 (*)    Glucose, Bld 168 (*)    BUN 21 (*)    Creatinine, Ser 2.34 (*)    Calcium 8.2 (*)    Albumin 1.9 (*)    AST 74 (*)    ALT 68 (*)    GFR, Estimated 33 (*)    Anion gap 19 (*)    All other components within normal limits  CBC WITH DIFFERENTIAL/PLATELET - Abnormal; Notable for the following components:   WBC 22.0 (*)    RBC 3.98 (*)    Hemoglobin 10.3 (*)    HCT 31.9 (*)    MCH 25.9 (*)    Platelets 441 (*)    Neutro Abs 19.9 (*)    Monocytes Absolute 1.2 (*)    Abs Immature Granulocytes 0.20 (*)    All other components within normal limits  PROTIME-INR - Abnormal; Notable for the following components:   Prothrombin Time 18.8 (*)    INR 1.6 (*)    All other components within normal limits  APTT - Abnormal; Notable for the following components:   aPTT 38 (*)    All other components within normal limits  RESP PANEL BY RT-PCR (FLU A&B, COVID) [id number]  CULTURE, BLOOD (ROUTINE X 2)  CULTURE, BLOOD (ROUTINE X 2)  URINE CULTURE  LACTIC ACID, PLASMA  URINALYSIS, ROUTINE W REFLEX MICROSCOPIC  MAGNESIUM    EKG EKG Interpretation  Date/Time:  Tuesday September 03 2021 18:13:55 EDT Ventricular Rate:  90 PR Interval:  141 QRS Duration: 105 QT Interval:  449 QTC Calculation: 550 R Axis:   -28 Text Interpretation: Sinus rhythm Ventricular premature complex Abnormal R-wave progression, early transition Inferior infarct, old Prolonged QT interval prolonged QT new since previous Confirmed by Wandra Arthurs [phone]) on 09/03/2021 6:27:34 PM  Radiology CT ABDOMEN PELVIS WO CONTRAST  Result Date: 09/03/2021 CLINICAL DATA:  Acute generalized abdominal pain, nausea, vomiting. EXAM: CT ABDOMEN AND  PELVIS WITHOUT CONTRAST TECHNIQUE: Multidetector CT imaging of the abdomen and pelvis was performed following the standard protocol without IV contrast. RADIATION DOSE REDUCTION: This exam was performed according to the departmental dose-optimization program which includes automated exposure control, adjustment of the mA and/or kV according to patient size and/or use of iterative reconstruction  technique. COMPARISON:  May 03, 2021.  January 26, 2017. FINDINGS: Lower chest: No acute abnormality. Hepatobiliary: No focal liver abnormality is seen. No gallstones, gallbladder wall thickening, or biliary dilatation. Pancreas: Unremarkable. No pancreatic ductal dilatation or surrounding inflammatory changes. Spleen: Normal in size without focal abnormality. Adrenals/Urinary Tract: Adrenal glands and kidneys are unremarkable. No hydronephrosis or renal obstruction is noted. Multiple large bladder stones are noted. Foley catheter is noted. Urinary bladder is decompressed as a result. Stomach/Bowel: Stomach is within normal limits. Appendix appears normal. No evidence of bowel wall thickening, distention, or inflammatory changes. Vascular/Lymphatic: No significant vascular abnormality is noted. Mildly enlarged periaortic adenopathy is again noted which most likely is reactive in etiology. Reproductive: Prostate is unremarkable. Other: Continued presence of large decubitus ulcer seen involving the left buttocks which is not significantly changed. It is noted to extend is deep as the base of the penis. Small fat containing right inguinal hernia is noted. Musculoskeletal: Extensive bilateral hip dysplasia and heterotopic bone formation is noted which is unchanged. Stable findings consistent with chronic osteomyelitis involving bilateral inferior pubic rami. There is noted gas within the left hip joint, and septic arthritis cannot be excluded. IMPRESSION: Multiple large bladder stones are noted. Urinary bladder is decompressed  secondary to Foley catheter. Continued presence of large decubitus ulcer seen involving the left buttocks which is not significantly changed compared to prior exam. It is seen to extend to the base of the penis. Stable chronic changes are seen involving both hip joints consistent with hip dysplasia and heterotopic bone formation. However, there is now noted air within the left hip joint concerning for possible septic arthritis. Electronically Signed   By: Marijo Conception M.D.   On: 09/03/2021 19:13   DG Chest Port 1 View  Result Date: 09/03/2021 CLINICAL DATA:  Fever. EXAM: PORTABLE CHEST 1 VIEW COMPARISON:  May 03, 2021. FINDINGS: The heart size and mediastinal contours are within normal limits. Both lungs are clear. The visualized skeletal structures are unremarkable. IMPRESSION: No active disease. Electronically Signed   By: Marijo Conception M.D.   On: 09/03/2021 16:27    Procedures Procedures    CRITICAL CARE Performed by: Wandra Arthurs   Total critical care time: 45 minutes  Critical care time was exclusive of separately billable procedures and treating other patients.  Critical care was necessary to treat or prevent imminent or life-threatening deterioration.  Critical care was time spent personally by me on the following activities: development of treatment plan with patient and/or surrogate as well as nursing, discussions with consultants, evaluation of patient's response to treatment, examination of patient, obtaining history from patient or surrogate, ordering and performing treatments and interventions, ordering and review of laboratory studies, ordering and review of radiographic studies, pulse oximetry and re-evaluation of patient's condition.   Medications Ordered in ED Medications  lactated ringers infusion ( Intravenous New Bag/Given 09/03/21 1746)  lactated ringers bolus 1,000 mL (0 mLs Intravenous Stopped 09/03/21 1932)    And  lactated ringers bolus 1,000 mL (0 mLs  Intravenous Stopped 09/03/21 1730)    And  lactated ringers bolus 1,000 mL (1,000 mLs Intravenous New Bag/Given 09/03/21 1932)  ibuprofen (ADVIL) tablet 800 mg (800 mg Oral Patient Refused/Not Given 09/03/21 1853)  potassium chloride 10 mEq in 100 mL IVPB (10 mEq Intravenous New Bag/Given 09/03/21 1904)  cefTRIAXone (ROCEPHIN) 2 g in sodium chloride 0.9 % 100 mL IVPB (0 g Intravenous Stopped 09/03/21 1730)  ondansetron (ZOFRAN) injection 4 mg (4 mg  Intravenous Given 09/03/21 1738)  potassium chloride SA (KLOR-CON M) CR tablet 40 mEq (40 mEq Oral Given 09/03/21 1747)    ED Course/ Medical Decision Making/ A&P                           Medical Decision Making MACEN JOSLIN is a 51 y.o. male here presenting with fever and sacral decub ulcer.  Concern for possible recurrent osteomyelitis.  Code sepsis initiated.  Plan to get CBC and CMP and lactate and cultures and chest x-ray and CT pelvis.  Will give broad-spectrum antibiotics and 30 cc/kg bolus.  7:34 PM Reviewed patient's labs and independently interpreted CT scans.  Labs showed white blood cell count of 22.  Patient also has potassium of 2.7 and prolonged QT.  Patient has a lactate of 3.8.  Patient's CT showed decub also now involving the left hip joint.  I discussed with Dr. Magnus Ivan from Ortho initially.  He states that he recommend surgery consult.  I discussed with Dr. Dossie Der.  He states that he does not recommend any surgical intervention but just IV antibiotics.  Family practice to admit for sepsis from osteomyelitis and hypokalemia.  10:24 PM Patient dropped his pressure down to the 70s.  I ordered another liter of fluid.  Consulted ICU.  Dr. Theda Belfast saw patient. After 4th liter, BP went up to 90s. ICU to follow and patient will be admitted to stepdown under family practice   Problems Addressed: Hypokalemia: acute illness or injury Osteomyelitis, unspecified site, unspecified type [facility]): acute illness or injury  Amount and/or  Complexity of Data Reviewed Labs: ordered. Decision-making details documented in ED Course. Radiology: ordered and independent interpretation performed. Decision-making details documented in ED Course.  Risk OTC drugs. Prescription drug management. Decision regarding hospitalization.    Final Clinical Impression(s) / ED Diagnoses Final diagnoses:  None    Rx / DC Orders ED Discharge Orders     None         Charlynne Pander, MD 09/03/21 1954    Charlynne Pander, MD 09/03/21 2225

## 2021-09-03 NOTE — ED Notes (Signed)
Pt not responding to be roomes

## 2021-09-03 NOTE — Assessment & Plan Note (Addendum)
Stable. S/p removal of left femoral head 8/22 with repeat wound debridement and wound cultures with Enterococcus facialis. [name] with mitral valve vegetation. -ID following, appreciate recs -Ortho following, appreciate recs -needs 6 weeks IV antibiotics for endocarditis, PICC line placed.  -Scheduled Tylenol, 5-10 mg Oxy as needed for pain control -continue ampicillin and ceftriaxone for possible native valve IE due to E faecalis per ID -plan for SNF but limited by placement of wound vac

## 2021-09-03 NOTE — Assessment & Plan Note (Addendum)
Chronic stage IV pressure ulcer on left buttock, stage III and stage II ulcers present in sacral region.  Additional wound on dorsum of right foot noted.  -Bedside nursing performing wound care  -surgery to see this am to ensure wound vac appropriate

## 2021-09-04 ENCOUNTER — Encounter (HOSPITAL_COMMUNITY): Payer: Self-pay | Admitting: Student

## 2021-09-04 DIAGNOSIS — Q057 Lumbar spina bifida without hydrocephalus: Secondary | ICD-10-CM | POA: Diagnosis not present

## 2021-09-04 DIAGNOSIS — I493 Ventricular premature depolarization: Secondary | ICD-10-CM | POA: Diagnosis not present

## 2021-09-04 DIAGNOSIS — L89310 Pressure ulcer of right buttock, unstageable: Secondary | ICD-10-CM | POA: Diagnosis not present

## 2021-09-04 DIAGNOSIS — S71002A Unspecified open wound, left hip, initial encounter: Secondary | ICD-10-CM | POA: Diagnosis not present

## 2021-09-04 DIAGNOSIS — I330 Acute and subacute infective endocarditis: Secondary | ICD-10-CM | POA: Diagnosis present

## 2021-09-04 DIAGNOSIS — R0789 Other chest pain: Secondary | ICD-10-CM | POA: Diagnosis not present

## 2021-09-04 DIAGNOSIS — R748 Abnormal levels of other serum enzymes: Secondary | ICD-10-CM | POA: Diagnosis not present

## 2021-09-04 DIAGNOSIS — S73002A Unspecified subluxation of left hip, initial encounter: Secondary | ICD-10-CM | POA: Diagnosis not present

## 2021-09-04 DIAGNOSIS — M162 Bilateral osteoarthritis resulting from hip dysplasia: Secondary | ICD-10-CM | POA: Diagnosis not present

## 2021-09-04 DIAGNOSIS — S73005A Unspecified dislocation of left hip, initial encounter: Secondary | ICD-10-CM | POA: Diagnosis not present

## 2021-09-04 DIAGNOSIS — Z79899 Other long term (current) drug therapy: Secondary | ICD-10-CM | POA: Diagnosis not present

## 2021-09-04 DIAGNOSIS — B9689 Other specified bacterial agents as the cause of diseases classified elsewhere: Secondary | ICD-10-CM | POA: Diagnosis not present

## 2021-09-04 DIAGNOSIS — R5381 Other malaise: Secondary | ICD-10-CM | POA: Diagnosis not present

## 2021-09-04 DIAGNOSIS — I872 Venous insufficiency (chronic) (peripheral): Secondary | ICD-10-CM | POA: Diagnosis not present

## 2021-09-04 DIAGNOSIS — L89522 Pressure ulcer of left ankle, stage 2: Secondary | ICD-10-CM | POA: Diagnosis not present

## 2021-09-04 DIAGNOSIS — M009 Pyogenic arthritis, unspecified: Secondary | ICD-10-CM | POA: Diagnosis present

## 2021-09-04 DIAGNOSIS — F29 Unspecified psychosis not due to a substance or known physiological condition: Secondary | ICD-10-CM | POA: Diagnosis not present

## 2021-09-04 DIAGNOSIS — I959 Hypotension, unspecified: Secondary | ICD-10-CM | POA: Diagnosis not present

## 2021-09-04 DIAGNOSIS — F419 Anxiety disorder, unspecified: Secondary | ICD-10-CM | POA: Diagnosis not present

## 2021-09-04 DIAGNOSIS — R079 Chest pain, unspecified: Secondary | ICD-10-CM | POA: Diagnosis not present

## 2021-09-04 DIAGNOSIS — M71052 Abscess of bursa, left hip: Secondary | ICD-10-CM | POA: Diagnosis not present

## 2021-09-04 DIAGNOSIS — I96 Gangrene, not elsewhere classified: Secondary | ICD-10-CM | POA: Diagnosis present

## 2021-09-04 DIAGNOSIS — N210 Calculus in bladder: Secondary | ICD-10-CM | POA: Diagnosis not present

## 2021-09-04 DIAGNOSIS — R6520 Severe sepsis without septic shock: Secondary | ICD-10-CM | POA: Diagnosis not present

## 2021-09-04 DIAGNOSIS — B952 Enterococcus as the cause of diseases classified elsewhere: Secondary | ICD-10-CM | POA: Diagnosis present

## 2021-09-04 DIAGNOSIS — Z87891 Personal history of nicotine dependence: Secondary | ICD-10-CM | POA: Diagnosis not present

## 2021-09-04 DIAGNOSIS — R9431 Abnormal electrocardiogram [ECG] [EKG]: Secondary | ICD-10-CM | POA: Diagnosis not present

## 2021-09-04 DIAGNOSIS — A4181 Sepsis due to Enterococcus: Secondary | ICD-10-CM | POA: Diagnosis present

## 2021-09-04 DIAGNOSIS — E876 Hypokalemia: Secondary | ICD-10-CM | POA: Diagnosis present

## 2021-09-04 DIAGNOSIS — Z9359 Other cystostomy status: Secondary | ICD-10-CM | POA: Diagnosis not present

## 2021-09-04 DIAGNOSIS — I083 Combined rheumatic disorders of mitral, aortic and tricuspid valves: Secondary | ICD-10-CM | POA: Diagnosis not present

## 2021-09-04 DIAGNOSIS — D62 Acute posthemorrhagic anemia: Secondary | ICD-10-CM | POA: Diagnosis not present

## 2021-09-04 DIAGNOSIS — G8220 Paraplegia, unspecified: Secondary | ICD-10-CM | POA: Diagnosis present

## 2021-09-04 DIAGNOSIS — Q549 Hypospadias, unspecified: Secondary | ICD-10-CM | POA: Diagnosis not present

## 2021-09-04 DIAGNOSIS — M86452 Chronic osteomyelitis with draining sinus, left femur: Secondary | ICD-10-CM | POA: Diagnosis not present

## 2021-09-04 DIAGNOSIS — L089 Local infection of the skin and subcutaneous tissue, unspecified: Secondary | ICD-10-CM | POA: Diagnosis not present

## 2021-09-04 DIAGNOSIS — N360 Urethral fistula: Secondary | ICD-10-CM | POA: Diagnosis present

## 2021-09-04 DIAGNOSIS — M6281 Muscle weakness (generalized): Secondary | ICD-10-CM | POA: Diagnosis not present

## 2021-09-04 DIAGNOSIS — M00852 Arthritis due to other bacteria, left hip: Secondary | ICD-10-CM | POA: Diagnosis not present

## 2021-09-04 DIAGNOSIS — A419 Sepsis, unspecified organism: Secondary | ICD-10-CM | POA: Diagnosis not present

## 2021-09-04 DIAGNOSIS — L03116 Cellulitis of left lower limb: Secondary | ICD-10-CM | POA: Diagnosis not present

## 2021-09-04 DIAGNOSIS — M869 Osteomyelitis, unspecified: Secondary | ICD-10-CM

## 2021-09-04 DIAGNOSIS — D638 Anemia in other chronic diseases classified elsewhere: Secondary | ICD-10-CM | POA: Diagnosis present

## 2021-09-04 DIAGNOSIS — E43 Unspecified severe protein-calorie malnutrition: Secondary | ICD-10-CM | POA: Diagnosis not present

## 2021-09-04 DIAGNOSIS — L89324 Pressure ulcer of left buttock, stage 4: Secondary | ICD-10-CM | POA: Diagnosis not present

## 2021-09-04 DIAGNOSIS — L89154 Pressure ulcer of sacral region, stage 4: Secondary | ICD-10-CM | POA: Diagnosis present

## 2021-09-04 DIAGNOSIS — N319 Neuromuscular dysfunction of bladder, unspecified: Secondary | ICD-10-CM | POA: Diagnosis present

## 2021-09-04 DIAGNOSIS — R7881 Bacteremia: Secondary | ICD-10-CM | POA: Diagnosis not present

## 2021-09-04 DIAGNOSIS — N179 Acute kidney failure, unspecified: Secondary | ICD-10-CM | POA: Diagnosis not present

## 2021-09-04 DIAGNOSIS — L89890 Pressure ulcer of other site, unstageable: Secondary | ICD-10-CM | POA: Diagnosis not present

## 2021-09-04 DIAGNOSIS — Z1621 Resistance to vancomycin: Secondary | ICD-10-CM | POA: Diagnosis not present

## 2021-09-04 DIAGNOSIS — Z7401 Bed confinement status: Secondary | ICD-10-CM | POA: Diagnosis not present

## 2021-09-04 DIAGNOSIS — K7200 Acute and subacute hepatic failure without coma: Secondary | ICD-10-CM | POA: Diagnosis not present

## 2021-09-04 DIAGNOSIS — L98418 Non-pressure chronic ulcer of buttock with other specified severity: Secondary | ICD-10-CM | POA: Diagnosis not present

## 2021-09-04 DIAGNOSIS — L89153 Pressure ulcer of sacral region, stage 3: Secondary | ICD-10-CM | POA: Diagnosis not present

## 2021-09-04 DIAGNOSIS — M1990 Unspecified osteoarthritis, unspecified site: Secondary | ICD-10-CM | POA: Diagnosis not present

## 2021-09-04 DIAGNOSIS — M86072 Acute hematogenous osteomyelitis, left ankle and foot: Secondary | ICD-10-CM | POA: Diagnosis not present

## 2021-09-04 DIAGNOSIS — Z4889 Encounter for other specified surgical aftercare: Secondary | ICD-10-CM | POA: Diagnosis not present

## 2021-09-04 DIAGNOSIS — R6521 Severe sepsis with septic shock: Secondary | ICD-10-CM | POA: Diagnosis present

## 2021-09-04 DIAGNOSIS — L89894 Pressure ulcer of other site, stage 4: Secondary | ICD-10-CM | POA: Diagnosis not present

## 2021-09-04 DIAGNOSIS — I059 Rheumatic mitral valve disease, unspecified: Secondary | ICD-10-CM | POA: Diagnosis not present

## 2021-09-04 DIAGNOSIS — L89329 Pressure ulcer of left buttock, unspecified stage: Secondary | ICD-10-CM | POA: Diagnosis not present

## 2021-09-04 DIAGNOSIS — I10 Essential (primary) hypertension: Secondary | ICD-10-CM | POA: Diagnosis present

## 2021-09-04 DIAGNOSIS — D649 Anemia, unspecified: Secondary | ICD-10-CM | POA: Diagnosis not present

## 2021-09-04 DIAGNOSIS — I739 Peripheral vascular disease, unspecified: Secondary | ICD-10-CM | POA: Diagnosis not present

## 2021-09-04 DIAGNOSIS — M86652 Other chronic osteomyelitis, left thigh: Secondary | ICD-10-CM | POA: Diagnosis present

## 2021-09-04 DIAGNOSIS — Z881 Allergy status to other antibiotic agents status: Secondary | ICD-10-CM | POA: Diagnosis not present

## 2021-09-04 DIAGNOSIS — Z20822 Contact with and (suspected) exposure to covid-19: Secondary | ICD-10-CM | POA: Diagnosis present

## 2021-09-04 DIAGNOSIS — R5383 Other fatigue: Secondary | ICD-10-CM | POA: Diagnosis not present

## 2021-09-04 DIAGNOSIS — L89893 Pressure ulcer of other site, stage 3: Secondary | ICD-10-CM | POA: Diagnosis present

## 2021-09-04 DIAGNOSIS — L89224 Pressure ulcer of left hip, stage 4: Secondary | ICD-10-CM | POA: Diagnosis present

## 2021-09-04 DIAGNOSIS — K820 Obstruction of gallbladder: Secondary | ICD-10-CM | POA: Diagnosis not present

## 2021-09-04 DIAGNOSIS — I071 Rheumatic tricuspid insufficiency: Secondary | ICD-10-CM | POA: Diagnosis not present

## 2021-09-04 LAB — COMPREHENSIVE METABOLIC PANEL
ALT: 55 U/L — ABNORMAL HIGH (ref 0–44)
AST: 70 U/L — ABNORMAL HIGH (ref 15–41)
Albumin: <1.5 g/dL — ABNORMAL LOW (ref 3.5–5.0)
Alkaline Phosphatase: 83 U/L (ref 38–126)
Anion gap: 8 (ref 5–15)
BUN: 15 mg/dL (ref 6–20)
CO2: 26 mmol/L (ref 22–32)
Calcium: 7.7 mg/dL — ABNORMAL LOW (ref 8.9–10.3)
Chloride: 103 mmol/L (ref 98–111)
Creatinine, Ser: 1.17 mg/dL (ref 0.61–1.24)
GFR, Estimated: >60 mL/min (ref >60)
Glucose, Bld: 145 mg/dL — ABNORMAL HIGH (ref 70–99)
Potassium: 3 mmol/L — ABNORMAL LOW (ref 3.5–5.1)
Sodium: 137 mmol/L (ref 135–145)
Total Bilirubin: 0.3 mg/dL (ref 0.3–1.2)
Total Protein: 5.4 g/dL — ABNORMAL LOW (ref 6.5–8.1)

## 2021-09-04 LAB — CBC
HCT: 26.8 % — ABNORMAL LOW (ref 39.0–52.0)
Hemoglobin: 8.5 g/dL — ABNORMAL LOW (ref 13.0–17.0)
MCH: 25.9 pg — ABNORMAL LOW (ref 26.0–34.0)
MCHC: 31.7 g/dL (ref 30.0–36.0)
MCV: 81.7 fL (ref 80.0–100.0)
Platelets: 304 10*3/uL (ref 150–400)
RBC: 3.28 MIL/uL — ABNORMAL LOW (ref 4.22–5.81)
RDW: 14.6 % (ref 11.5–15.5)
WBC: 15.5 10*3/uL — ABNORMAL HIGH (ref 4.0–10.5)
nRBC: 0 % (ref 0.0–0.2)

## 2021-09-04 LAB — HEMOGLOBIN AND HEMATOCRIT, BLOOD
HCT: 28.8 % — ABNORMAL LOW (ref 39.0–52.0)
Hemoglobin: 9.3 g/dL — ABNORMAL LOW (ref 13.0–17.0)

## 2021-09-04 LAB — HIV ANTIBODY (ROUTINE TESTING W REFLEX): HIV Screen 4th Generation wRfx: NONREACTIVE

## 2021-09-04 LAB — CK: Total CK: 92 U/L (ref 49–397)

## 2021-09-04 LAB — ABO/RH: ABO/RH(D): O POS

## 2021-09-04 LAB — PROCALCITONIN: Procalcitonin: 47.11 ng/mL

## 2021-09-04 MED ORDER — SODIUM CHLORIDE 0.9 % IV SOLN
500.0000 mg | Freq: Every day | INTRAVENOUS | Status: DC
  Administered 2021-09-05: 500 mg via INTRAVENOUS
  Filled 2021-09-04 (×2): qty 10

## 2021-09-04 MED ORDER — METRONIDAZOLE 500 MG/100ML IV SOLN
500.0000 mg | Freq: Two times a day (BID) | INTRAVENOUS | Status: DC
  Administered 2021-09-04 – 2021-09-06 (×5): 500 mg via INTRAVENOUS
  Filled 2021-09-04 (×4): qty 100

## 2021-09-04 MED ORDER — IBUPROFEN 200 MG PO TABS
600.0000 mg | ORAL_TABLET | Freq: Four times a day (QID) | ORAL | Status: DC
Start: 2021-09-04 — End: 2021-09-05
  Administered 2021-09-04 – 2021-09-05 (×4): 600 mg via ORAL
  Filled 2021-09-04 (×3): qty 3
  Filled 2021-09-04: qty 1

## 2021-09-04 MED ORDER — MAGNESIUM SULFATE 2 GM/50ML IV SOLN
2.0000 g | Freq: Once | INTRAVENOUS | Status: AC
  Administered 2021-09-04: 2 g via INTRAVENOUS
  Filled 2021-09-04: qty 50

## 2021-09-04 MED ORDER — FLECAINIDE ACETATE 50 MG PO TABS
50.0000 mg | ORAL_TABLET | Freq: Two times a day (BID) | ORAL | Status: DC
  Administered 2021-09-04 – 2021-09-24 (×39): 50 mg via ORAL
  Filled 2021-09-04 (×44): qty 1

## 2021-09-04 MED ORDER — POTASSIUM CHLORIDE CRYS ER 20 MEQ PO TBCR
40.0000 meq | EXTENDED_RELEASE_TABLET | ORAL | Status: AC
Start: 2021-09-04 — End: 2021-09-04
  Administered 2021-09-04 (×2): 40 meq via ORAL
  Filled 2021-09-04 (×2): qty 2

## 2021-09-04 MED ORDER — SODIUM CHLORIDE 0.9 % IV SOLN: 650.0000 mg | Freq: Every day | INTRAVENOUS | Status: DC

## 2021-09-04 MED ORDER — DAKINS (1/4 STRENGTH) 0.125 % EX SOLN
Freq: Two times a day (BID) | CUTANEOUS | Status: AC
  Administered 2021-09-08: 1
  Filled 2021-09-04 (×4): qty 473

## 2021-09-04 MED ORDER — ACETAMINOPHEN 325 MG PO TABS
650.0000 mg | ORAL_TABLET | Freq: Four times a day (QID) | ORAL | Status: DC
  Administered 2021-09-04 – 2021-09-05 (×4): 650 mg via ORAL
  Filled 2021-09-04 (×4): qty 2

## 2021-09-04 MED ORDER — SODIUM CHLORIDE 0.9 % IV SOLN
250.0000 mL | INTRAVENOUS | Status: DC
  Administered 2021-09-04: 250 mL via INTRAVENOUS

## 2021-09-04 MED ORDER — PHENYLEPHRINE HCL-NACL 20-0.9 MG/250ML-% IV SOLN
25.0000 ug/min | INTRAVENOUS | Status: DC
  Administered 2021-09-04: 55 ug/min via INTRAVENOUS
  Administered 2021-09-04: 25 ug/min via INTRAVENOUS
  Filled 2021-09-04 (×2): qty 250

## 2021-09-04 MED ORDER — SODIUM CHLORIDE 0.9 % IV SOLN: 2.0000 g | Freq: Once | INTRAVENOUS | Status: DC

## 2021-09-04 MED ORDER — CHLORHEXIDINE GLUCONATE CLOTH 2 % EX PADS
6.0000 | MEDICATED_PAD | Freq: Every day | CUTANEOUS | Status: DC
  Administered 2021-09-04 – 2021-09-24 (×20): 6 via TOPICAL

## 2021-09-04 MED ORDER — SODIUM CHLORIDE 0.9 % IV SOLN: 2.0000 g | Freq: Three times a day (TID) | INTRAVENOUS | Status: DC

## 2021-09-04 MED ORDER — NOREPINEPHRINE 4 MG/250ML-% IV SOLN
2.0000 ug/min | INTRAVENOUS | Status: DC
  Administered 2021-09-04: 2 ug/min via INTRAVENOUS
  Administered 2021-09-05: 3 ug/min via INTRAVENOUS
  Filled 2021-09-04 (×2): qty 250

## 2021-09-04 MED ORDER — SODIUM CHLORIDE 0.9 % IV SOLN
2.0000 g | INTRAVENOUS | Status: DC
  Administered 2021-09-05 – 2021-09-10 (×6): 2 g via INTRAVENOUS
  Filled 2021-09-04 (×6): qty 20

## 2021-09-04 NOTE — Progress Notes (Signed)
BP somewhat improved MAP in the 60s s/p 5L LR boluses. Will monitor and reach out to critical care if MAP consistently <60.  Notifed by RN of Hgb 6.7 down from 10.3 earlier today likely dilutional from IV fluids, will order repeat H/H stat and type and screen. Patient consented for blood transfusion if needed.  Mg 1.3 repleted with 2g Mg sulfate  BP (!) 88/48   Pulse (!) 102   Temp (!) 100.8 F (38.2 C) (Oral)   Resp 14   SpO2 93%

## 2021-09-04 NOTE — Consult Note (Addendum)
Reason for Consult:R/o left septic hip Referring Physician: Augusto Gamble Time called: 0745 Time at bedside: 0915   Anthony [name] is an 51 y.o. male.  HPI: Anthony [name] presented to the ED with a c/o fever and abd pain for about 10d. He was found to have sepsis and this was attributed to a long-standing stage IV sacral decub. CT scan showed some air in the left hip joint that is new and orthopedic surgery was consulted to r/o septic arthritis. He denies hip pain except very occasionally and notes no subjective difference in the feeling of his hip for the last 5 years or so.  Past Medical History:  Diagnosis Date   Chronic indwelling Foley catheter    PVCs (premature ventricular contractions)    Spina bifida    Wheelchair bound     Past Surgical History:  Procedure Laterality Date   BACK SURGERY     HIP SURGERY     INCISION AND DRAINAGE OF WOUND N/A 01/24/2017   Procedure: IRRIGATION AND DEBRIDEMENT WOUND- BUTTOCK ABSCESS;  Surgeon: Almond Lint, MD;  Location: [facility];  Service: General;  Laterality: N/A;    Family History  Problem Relation Age of Onset   CAD Father        died age 9 from MI   Sudden Cardiac Death Sister        age 16   CAD Maternal Grandfather        died age 2 MI    Social History:  reports that he has never smoked. He has never used smokeless tobacco. He reports that he does not drink alcohol. No history on file for drug use.  Allergies:  Allergies  Allergen Reactions   Vancomycin Itching    Medications: I have reviewed the patient's current medications.  Results for orders placed or performed during the hospital encounter of 09/03/21 (from the past 48 hour(s))  Resp Panel by RT-PCR (Flu A&B, Covid) Anterior Nasal Swab     Status: None   Collection Time: 09/03/21  3:36 PM   Specimen: Anterior Nasal Swab  Result Value Ref Range   SARS Coronavirus 2 by RT PCR NEGATIVE NEGATIVE    Comment: (NOTE) SARS-CoV-2 target nucleic acids are NOT DETECTED.  The  SARS-CoV-2 RNA is generally detectable in upper respiratory specimens during the acute phase of infection. The lowest concentration of SARS-CoV-2 viral copies this assay can detect is 138 copies/mL. A negative result does not preclude SARS-Cov-2 infection and should not be used as the sole basis for treatment or other patient management decisions. A negative result may occur with  improper specimen collection/handling, submission of specimen other than nasopharyngeal swab, presence of viral mutation(s) within the areas targeted by this assay, and inadequate number of viral copies(<138 copies/mL). A negative result must be combined with clinical observations, patient history, and epidemiological information. The expected result is Negative.  Fact Sheet for Patients:  BloggerCourse.com  Fact Sheet for Healthcare Providers:  SeriousBroker.it  This test is no t yet approved or cleared by the [place] FDA and  has been authorized for detection and/or diagnosis of SARS-CoV-2 by FDA under an Emergency Use Authorization (EUA). This EUA will remain  in effect (meaning this test can be used) for the duration of the COVID-19 declaration under Section 564(b)(1) of the Act, 21 U.S.C.section 360bbb-3(b)(1), unless the authorization is terminated  or revoked sooner.       Influenza A by PCR NEGATIVE NEGATIVE   Influenza B by PCR NEGATIVE NEGATIVE  Comment: (NOTE) The Xpert Xpress SARS-CoV-2/FLU/RSV plus assay is intended as an aid in the diagnosis of influenza from Nasopharyngeal swab specimens and should not be used as a sole basis for treatment. Nasal washings and aspirates are unacceptable for Xpert Xpress SARS-CoV-2/FLU/RSV testing.  Fact Sheet for Patients: BloggerCourse.comhttps://www.fda.gov/media/152166/download  Fact Sheet for Healthcare Providers: SeriousBroker.ithttps://www.fda.gov/media/152162/download  This test is not yet approved or cleared by the  [place] FDA and has been authorized for detection and/or diagnosis of SARS-CoV-2 by FDA under an Emergency Use Authorization (EUA). This EUA will remain in effect (meaning this test can be used) for the duration of the COVID-19 declaration under Section 564(b)(1) of the Act, 21 U.S.C. section 360bbb-3(b)(1), unless the authorization is terminated or revoked.  Performed at [facility], [address] [place], [place] [address]   Lactic acid, plasma     Status: Abnormal   Collection Time: 09/03/21  4:18 PM  Result Value Ref Range   Lactic Acid, Venous 3.8 (HH) 0.5 - 1.9 mmol/L    Comment: CRITICAL RESULT CALLED TO, READ BACK BY AND VERIFIED WITH G TATE RN 1712 09/03/2021 BY R VERAAR Performed at [facility], [address] [place], [place] [address]   Comprehensive metabolic panel     Status: Abnormal   Collection Time: 09/03/21  4:18 PM  Result Value Ref Range   Sodium 134 (L) 135 - 145 mmol/L   Potassium 2.7 (LL) 3.5 - 5.1 mmol/L    Comment: CRITICAL RESULT CALLED TO, READ BACK BY AND VERIFIED WITH G TATE RN 1712 09/03/2021 BY R VERAAR   Chloride 94 (L) 98 - 111 mmol/L   CO2 21 (L) 22 - 32 mmol/L   Glucose, Bld 168 (H) 70 - 99 mg/dL    Comment: Glucose reference range applies only to samples taken after fasting for at least 8 hours.   BUN 21 (H) 6 - 20 mg/dL   Creatinine, Ser 6.212.34 (H) 0.61 - 1.24 mg/dL   Calcium 8.2 (L) 8.9 - 10.3 mg/dL   Total Protein 7.4 6.5 - 8.1 g/dL   Albumin 1.9 (L) 3.5 - 5.0 g/dL   AST 74 (H) 15 - 41 U/L   ALT 68 (H) 0 - 44 U/L   Alkaline Phosphatase 105 38 - 126 U/L   Total Bilirubin 0.7 0.3 - 1.2 mg/dL   GFR, Estimated 33 (L) >60 mL/min    Comment: (NOTE) Calculated using the CKD-EPI Creatinine Equation (2021)    Anion gap 19 (H) 5 - 15    Comment: Performed at [facility], [address] [place], [place] [address]  CBC with Differential     Status: Abnormal   Collection Time: 09/03/21  4:18 PM  Result Value Ref Range    WBC 22.0 (H) 4.0 - 10.5 K/uL   RBC 3.98 (L) 4.22 - 5.81 MIL/uL   Hemoglobin 10.3 (L) 13.0 - 17.0 g/dL   HCT 78.431.9 (L) 69.639.0 - 29.552.0 %   MCV 80.2 80.0 - 100.0 fL   MCH 25.9 (L) 26.0 - 34.0 pg   MCHC 32.3 30.0 - 36.0 g/dL   RDW 28.414.6 13.211.5 - 44.015.5 %   Platelets 441 (H) 150 - 400 K/uL   nRBC 0.0 0.0 - 0.2 %   Neutrophils Relative % 91 %   Neutro Abs 19.9 (H) 1.7 - 7.7 K/uL   Lymphocytes Relative 3 %   Lymphs Abs 0.7 0.7 - 4.0 K/uL   Monocytes Relative 5 %  Monocytes Absolute 1.2 (H) 0.1 - 1.0 K/uL   Eosinophils Relative 0 %   Eosinophils Absolute 0.0 0.0 - 0.5 K/uL   Basophils Relative 0 %   Basophils Absolute 0.1 0.0 - 0.1 K/uL   Immature Granulocytes 1 %   Abs Immature Granulocytes 0.20 (H) 0.00 - 0.07 K/uL    Comment: Performed at [facility], [address] [place], [place] [address]  Protime-INR     Status: Abnormal   Collection Time: 09/03/21  4:18 PM  Result Value Ref Range   Prothrombin Time 18.8 (H) 11.4 - 15.2 seconds   INR 1.6 (H) 0.8 - 1.2    Comment: (NOTE) INR goal varies based on device and disease states. Performed at [facility], [address], [place], [place] [address]   APTT     Status: Abnormal   Collection Time: 09/03/21  4:18 PM  Result Value Ref Range   aPTT 38 (H) 24 - 36 seconds    Comment:        IF BASELINE aPTT IS ELEVATED, SUGGEST PATIENT RISK ASSESSMENT BE USED TO DETERMINE APPROPRIATE ANTICOAGULANT THERAPY. Performed at [facility], [address] [place], [place] [address]   Blood Culture (routine x 2)     Status: None (Preliminary result)   Collection Time: 09/03/21  4:18 PM   Specimen: BLOOD  Result Value Ref Range   Specimen Description BLOOD SITE NOT SPECIFIED    Special Requests      BOTTLES DRAWN AEROBIC AND ANAEROBIC Blood Culture adequate volume   Culture      NO GROWTH < 24 HOURS Performed at [facility], [address], [place], [place] [address]    Report Status PENDING   Urinalysis, Routine  w reflex microscopic     Status: Abnormal   Collection Time: 09/03/21  7:01 PM  Result Value Ref Range   Color, Urine YELLOW YELLOW   APPearance CLOUDY (A) CLEAR   Specific Gravity, Urine >1.030 (H) 1.005 - 1.030   pH 5.5 5.0 - 8.0   Glucose, UA NEGATIVE NEGATIVE mg/dL   Hgb urine dipstick LARGE (A) NEGATIVE   Bilirubin Urine SMALL (A) NEGATIVE   Ketones, ur NEGATIVE NEGATIVE mg/dL   Protein, ur 073 (A) NEGATIVE mg/dL   Nitrite POSITIVE (A) NEGATIVE   Leukocytes,Ua MODERATE (A) NEGATIVE    Comment: Performed at [facility], [address], [place], [place] [address]  Urinalysis, Microscopic (reflex)     Status: Abnormal   Collection Time: 09/03/21  7:01 PM  Result Value Ref Range   RBC / HPF 21-50 0 - 5 RBC/hpf   WBC, UA >50 0 - 5 WBC/hpf   Bacteria, UA MANY (A) NONE SEEN   Squamous Epithelial / LPF 6-10 0 - 5   WBC Clumps PRESENT    Mucus PRESENT    Hyaline Casts, UA PRESENT     Comment: Performed at [facility], [address] [place], [place] [address]  Lactic acid, plasma     Status: None   Collection Time: 09/03/21  7:37 PM  Result Value Ref Range   Lactic Acid, Venous 1.7 0.5 - 1.9 mmol/L    Comment: Performed at [facility], [address], [place], [place] [address]  CBC     Status: Abnormal   Collection Time: 09/03/21 10:01 PM  Result Value Ref Range   WBC 18.6 (H) 4.0 - 10.5 K/uL   RBC 2.57 (  L) 4.22 - 5.81 MIL/uL   Hemoglobin 6.7 (LL) 13.0 - 17.0 g/dL    Comment: REPEATED TO VERIFY THIS CRITICAL RESULT HAS VERIFIED AND BEEN CALLED TO GRACE PATE,RN BY PAMELA HENDERSON ON 08 15 2023 AT 2309, AND HAS BEEN READ BACK.     HCT 20.9 (L) 39.0 - 52.0 %   MCV 81.3 80.0 - 100.0 fL   MCH 26.1 26.0 - 34.0 pg   MCHC 32.1 30.0 - 36.0 g/dL   RDW 16.1 09.6 - 04.5 %   Platelets 342 150 - 400 K/uL   nRBC 0.0 0.0 - 0.2 %    Comment: Performed at [facility], [address], [place], [place] [address]  Creatinine, serum     Status: Abnormal    Collection Time: 09/03/21 10:01 PM  Result Value Ref Range   Creatinine, Ser 1.57 (H) 0.61 - 1.24 mg/dL   GFR, Estimated 53 (L) >60 mL/min    Comment: (NOTE) Calculated using the CKD-EPI Creatinine Equation (2021) Performed at [facility], [address] [place], [place] [address]   Magnesium     Status: Abnormal   Collection Time: 09/03/21 10:01 PM  Result Value Ref Range   Magnesium 1.3 (L) 1.7 - 2.4 mg/dL    Comment: Performed at [facility], [address] [place], [place] [address]  ABO/Rh     Status: None   Collection Time: 09/03/21 10:01 PM  Result Value Ref Range   ABO/RH(D)      O POS Performed at [facility], [address], [place], [place] [address]   Hemoglobin and hematocrit, blood     Status: Abnormal   Collection Time: 09/03/21 11:55 PM  Result Value Ref Range   Hemoglobin 9.3 (L) 13.0 - 17.0 g/dL    Comment: REPEATED TO VERIFY   HCT 28.8 (L) 39.0 - 52.0 %    Comment: Performed at [facility], [address], [place], [place] [address]  Type and screen [facility]     Status: None (Preliminary result)   Collection Time: 09/03/21 11:55 PM  Result Value Ref Range   ABO/RH(D) O POS    Antibody Screen NEG    Sample Expiration 09/06/2021,2359    PT AG Type      NEGATIVE FOR C ANTIGEN NEGATIVE FOR KELL ANTIGEN Performed at [facility], [address], [place], [place] [address]    Unit Number [id number]    Blood Component Type RED CELLS,LR    Unit division 00    Status of Unit REL FROM [facility]    Transfusion Status DO NOT ISSUE FOR TRANSFUSION    Crossmatch Result INCOMPATIBLE    Unit Number [id number]    Blood Component Type RED CELLS,LR    Unit division 00    Status of Unit ALLOCATED    Transfusion Status OK TO TRANSFUSE    Crossmatch Result COMPATIBLE    Donor AG Type NEGATIVE FOR C ANTIGEN    Unit Number [id number]    Blood Component Type RED CELLS,LR    Unit division 00    Status of Unit  REL FROM [facility]    Transfusion Status DO NOT ISSUE FOR TRANSFUSION    Crossmatch Result INCOMPATIBLE    Unit Number [id number]    Blood Component Type RED CELLS,LR    Unit division 00    Status of Unit REL FROM [facility]    Transfusion Status DO NOT ISSUE FOR  TRANSFUSION    Crossmatch Result INCOMPATIBLE    Unit Number [id number]    Blood Component Type RED CELLS,LR    Unit division 00    Status of Unit REL FROM [facility]    Transfusion Status DO NOT ISSUE FOR TRANSFUSION    Crossmatch Result INCOMPATIBLE    Unit Number [id number]    Blood Component Type RED CELLS,LR    Unit division 00    Status of Unit REL FROM [facility]    Transfusion Status DO NOT ISSUE FOR TRANSFUSION    Crossmatch Result INCOMPATIBLE   CBC     Status: Abnormal   Collection Time: 09/04/21  4:28 AM  Result Value Ref Range   WBC 15.5 (H) 4.0 - 10.5 K/uL   RBC 3.28 (L) 4.22 - 5.81 MIL/uL   Hemoglobin 8.5 (L) 13.0 - 17.0 g/dL   HCT 90.2 (L) 40.9 - 73.5 %   MCV 81.7 80.0 - 100.0 fL   MCH 25.9 (L) 26.0 - 34.0 pg   MCHC 31.7 30.0 - 36.0 g/dL   RDW 32.9 92.4 - 26.8 %   Platelets 304 150 - 400 K/uL   nRBC 0.0 0.0 - 0.2 %    Comment: Performed at [facility], [address], [place], [place] [address]  Comprehensive metabolic panel     Status: Abnormal   Collection Time: 09/04/21  4:28 AM  Result Value Ref Range   Sodium 137 135 - 145 mmol/L   Potassium 3.0 (L) 3.5 - 5.1 mmol/L   Chloride 103 98 - 111 mmol/L   CO2 26 22 - 32 mmol/L   Glucose, Bld 145 (H) 70 - 99 mg/dL    Comment: Glucose reference range applies only to samples taken after fasting for at least 8 hours.   BUN 15 6 - 20 mg/dL   Creatinine, Ser 2.22 0.61 - 1.24 mg/dL   Calcium 7.7 (L) 8.9 - 10.3 mg/dL   Total Protein 5.4 (L) 6.5 - 8.1 g/dL   Albumin <9.7 (L) 3.5 - 5.0 g/dL   AST 70 (H) 15 - 41 U/L   ALT 55 (H) 0 - 44 U/L   Alkaline Phosphatase 83 38 - 126 U/L   Total Bilirubin 0.3 0.3 - 1.2 mg/dL   GFR, Estimated >98 >92 mL/min     Comment: (NOTE) Calculated using the CKD-EPI Creatinine Equation (2021)    Anion gap 8 5 - 15    Comment: Performed at [facility], [address] [place], [place] [address]  CK     Status: None   Collection Time: 09/04/21  4:28 AM  Result Value Ref Range   Total CK 92 49 - 397 U/L    Comment: Performed at [facility], [address], [place], [place] [address]    CT ABDOMEN PELVIS WO CONTRAST  Result Date: 09/03/2021 CLINICAL DATA:  Acute generalized abdominal pain, nausea, vomiting. EXAM: CT ABDOMEN AND PELVIS WITHOUT CONTRAST TECHNIQUE: Multidetector CT imaging of the abdomen and pelvis was performed following the standard protocol without IV contrast. RADIATION DOSE REDUCTION: This exam was performed according to the departmental dose-optimization program which includes automated exposure control, adjustment of the mA and/or kV according to patient size and/or use of iterative reconstruction technique. COMPARISON:  May 03, 2021.  January 26, 2017. FINDINGS: Lower chest: No acute abnormality. Hepatobiliary: No focal liver abnormality is seen. No gallstones, gallbladder wall thickening, or biliary dilatation. Pancreas: Unremarkable. No pancreatic ductal dilatation or surrounding inflammatory changes.  Spleen: Normal in size without focal abnormality. Adrenals/Urinary Tract: Adrenal glands and kidneys are unremarkable. No hydronephrosis or renal obstruction is noted. Multiple large bladder stones are noted. Foley catheter is noted. Urinary bladder is decompressed as a result. Stomach/Bowel: Stomach is within normal limits. Appendix appears normal. No evidence of bowel wall thickening, distention, or inflammatory changes. Vascular/Lymphatic: No significant vascular abnormality is noted. Mildly enlarged periaortic adenopathy is again noted which most likely is reactive in etiology. Reproductive: Prostate is unremarkable. Other: Continued presence of large decubitus ulcer seen involving  the left buttocks which is not significantly changed. It is noted to extend is deep as the base of the penis. Small fat containing right inguinal hernia is noted. Musculoskeletal: Extensive bilateral hip dysplasia and heterotopic bone formation is noted which is unchanged. Stable findings consistent with chronic osteomyelitis involving bilateral inferior pubic rami. There is noted gas within the left hip joint, and septic arthritis cannot be excluded. IMPRESSION: Multiple large bladder stones are noted. Urinary bladder is decompressed secondary to Foley catheter. Continued presence of large decubitus ulcer seen involving the left buttocks which is not significantly changed compared to prior exam. It is seen to extend to the base of the penis. Stable chronic changes are seen involving both hip joints consistent with hip dysplasia and heterotopic bone formation. However, there is now noted air within the left hip joint concerning for possible septic arthritis. Electronically Signed   By: Lupita Raider M.D.   On: 09/03/2021 19:13   DG Chest Port 1 View  Result Date: 09/03/2021 CLINICAL DATA:  Fever. EXAM: PORTABLE CHEST 1 VIEW COMPARISON:  May 03, 2021. FINDINGS: The heart size and mediastinal contours are within normal limits. Both lungs are clear. The visualized skeletal structures are unremarkable. IMPRESSION: No active disease. Electronically Signed   By: Lupita Raider M.D.   On: 09/03/2021 16:27    Review of Systems  Constitutional:  Positive for fever. Negative for chills and diaphoresis.  HENT:  Negative for ear discharge, ear pain, hearing loss and tinnitus.   Eyes:  Negative for photophobia and pain.  Respiratory:  Negative for cough and shortness of breath.   Cardiovascular:  Negative for chest pain.  Gastrointestinal:  Positive for abdominal pain. Negative for nausea and vomiting.  Genitourinary:  Negative for dysuria, flank pain, frequency and urgency.  Musculoskeletal:  Negative for  arthralgias (Left hip), back pain, myalgias and neck pain.  Neurological:  Negative for dizziness and headaches.  Hematological:  Does not bruise/bleed easily.  Psychiatric/Behavioral:  The patient is not nervous/anxious.    Blood pressure (!) 78/48, pulse (!) 114, temperature (!) 101.4 F (38.6 C), resp. rate 18, SpO2 92 %. Physical Exam Constitutional:      General: He is not in acute distress.    Appearance: He is well-developed. He is not diaphoretic.  HENT:     Head: Normocephalic and atraumatic.  Eyes:     General: No scleral icterus.       Right eye: No discharge.        Left eye: No discharge.     Conjunctiva/sclera: Conjunctivae normal.  Cardiovascular:     Rate and Rhythm: Normal rate and regular rhythm.  Pulmonary:     Effort: Pulmonary effort is normal. No respiratory distress.  Musculoskeletal:     Cervical back: Normal range of motion.     Comments: LLE No traumatic wounds, ecchymosis, or rash  Nontender, no pain with flex/ext or int/ext rotation  No knee or  ankle effusion  Knee stable to varus/ valgus and anterior/posterior stress  Sens DPN, SPN, TN absent  Motor EHL, ext, flex, evers 0/5  DP 1+, PT 0, No significant edema  Skin:    General: Skin is warm and dry.  Neurological:     Mental Status: He is alert.  Psychiatric:        Mood and Affect: Mood normal.        Behavior: Behavior normal.    Assessment/Plan: Left hip pneumarthrosis -- Given lack of symptoms and nonfunctional joint at baseline no indication for aspiration or intervention.    Freeman Caldron, PA-C Orthopedic Surgery [phone] 09/04/2021, 9:23 AM

## 2021-09-04 NOTE — ED Notes (Signed)
Pt requesting tylenol to help with his pain. PRN order to be given.

## 2021-09-04 NOTE — Consult Note (Signed)
WOC Nurse Consult Note: Reason for Consult: pressure injuries Complex history, patient with spina bifida and longstanding history of pressure injuries Cares for pressure injuries himself.  We have seen patient several times over the years.  I can not find any indications he is followed by a wound care center now are in the past.  Wounds are complicated and tunnel.  Drainage is reported to be increasing.  He has been seen by general surgery 8/15 and again this am 8/16 with no indications for surgical needs.  CT scans reviewed from 8/15 and 4/14 No acute changes that impact topical wound care.  Chronic osteomyelitis which is not treated topically. Urological involvement; communication of wounds with base of penis.  Unfortunate and complex wounds that will not heal most likely.   Wound type: Chronic Stage 4 Pressure injuries over the left ischium and left trochanter region.  Stage 3 Pressure injury right foot  Pressure Injury POA: Yes Measurement: bedside nursing to add with admission to the floor and first 2 RN skin assessment  Wound bed: wounds over the left ischium and hip are clean, pink, non healing (per surgery notes) and review of images  Right foot; clean, pink, some deeper ruddiness, consistent with lack of pressure relief Drainage (amount, consistency, odor) moderate to heavy; with odor, consistent with chronic osteomyelitis Right foot wound drainage serosanguinous  Periwound: intact  Dressing procedure/placement/frequency: Low air loss mattress for pressure redistribution and moisture management  Palliative wound care for drainage control and odor for the ischial and hip wounds.  I would not use aggressive silver or even alginate due to complexity and communication between the wounds. Wound be difficult to retrieve.  Silver hydrofiber for the right foot wound,to manage drainage and bioburdan. Prevalon boots to offload if patient will comply with use.  I will ask ED staff to order bed,  unsure of the LOS in the ED at the current time.   Consider referral to a wound care center of the patient's choice for follow up for complex wound care needs long term. Plastic surgery may be helpful but unsure with the complexity of the wounds that surgical/flap intervention would be even a consideration at this point.   Re consult if needed, will not follow at this time. Thanks  Yerlin Gasparyan [employer], RN,CWOCN, CNS, CWON-AP [phone])

## 2021-09-04 NOTE — Progress Notes (Signed)
Interventional Radiology Brief Note:  Patient with history of paraplegia secondary to spina bifida with chronic sacral wounds who presents to ED with fevers and increased purulent drainage.  IR consulted for aspiration of left hip.   Request for aspiration reviewed by both IR and fluoro who note open wounds, purulent drainage, and joint destruction by CT.  No procedure planned at this time.   Patient now in care of CCM team, will notify.   Order canceled.   Loyce Dys, MS RD PA-C

## 2021-09-04 NOTE — Consult Note (Signed)
[facility]    Date of Admission:  09/03/2021     Reason for Consult: septic shock    Referring Provider: Delton Coombes     Abx: 8/16-c dapto 8/16-c ceftriaxone  8/16-c metronidazole       Assessment: 51 yo spina bifida with partial paraplegia, hypospadia/urethro-cutaneous fistula, chronic suprapubic catheter, bilateral hip dysplasia, hx ischiopubic abscess 2019 s/p debridement, chronic draining left hip sinus tract, admitted 8/16 with septic shock in setting several days increased left  hip discharge  He appears rather well for being in shock  Ct pelvis showed air tracking from left lateral hip skin area into joint. He is insensate at that level. There is no surrounding skin erythema. He has the urethro-cutaneous fistula which on ct doesn't appear to intersect with the left hip process  Blood cx ngtd Ucx e faecalis which I doubt is the cause of his septic shock  His spc was last changed 2 weeks prior to this admission. He has minimal sx there and I am not sure he can tell/distinguish uti    Plan: F/u bcx Would discuss case with ortho regard left hip joint finding on ct, which at this time appears to be most obvious source for sepsis Consider changing spc Continue empiric abx dapto/ceftriaxone/flagyl for now  Discussed with primary team   I spent 75 minute reviewing data/chart, and coordinating care and >50% direct face to face time providing counseling/discussing diagnostics/treatment plan with patient   ------------------------------------------------ Principal Problem:   Severe sepsis (HCC) Active Problems:   Essential hypertension   Pressure ulcer   AKI (acute kidney injury) (HCC)   Prolonged Q-T interval on ECG   Hypokalemia    HPI: Anthony [name] is a 51 y.o. male spina bifida with partial paraplegia, hypospadia/urethro-cutaneous fistula, chronic suprapubic catheter, bilateral hip dysplasia, hx ischiopubic abscess 2019 s/p  debridement, chronic draining left hip sinus tract, admitted 8/16 with septic shock in setting several days increased left  hip discharge  Patient appears very well when we were talking to him in the ED. His blood pressure was showing 70s-80s systolic while he get wide open saline fluid  He reports feeling well at baseline but with increased serous discharge at the left hip sinus tract and urethrocutaneous fistula  He is insensate from the spinabifida below the waste  No rash, cough, chest pain, headache His appetite is intact  His spc was last changed 2 weeks ago. He doesn't know when a uti would occur  In the ed: Febrile to 103 Leukocytosis 15 Hypotensive and getting his 3rd saline bag when we arrived to talk with him Pelvic ct showed sinus tract/air level from left hip joint Bcx in process He is started on empiric dapto/ceftriaxone/flagyl Lft mildly elevated but no n/v/abd pain  Due to persistent low blood pressure he'll be admitted to icu     Family History  Problem Relation Age of Onset   CAD Father        died age 21 from MI   Sudden Cardiac Death Sister        age 29   CAD Maternal Grandfather        died age 75 MI    Social History   Tobacco Use   Smoking status: Never   Smokeless tobacco: Never  Vaping Use   Vaping Use: Never used  Substance Use Topics   Alcohol use: No    Allergies  Allergen Reactions  Vancomycin Itching    Review of Systems: ROS All Other ROS was negative, except mentioned above   Past Medical History:  Diagnosis Date   Chronic indwelling Foley catheter    PVCs (premature ventricular contractions)    Spina bifida    Wheelchair bound        Scheduled Meds:  acetaminophen  650 mg Oral Q6H   Chlorhexidine Gluconate Cloth  6 each Topical Daily   flecainide  50 mg Oral BID   heparin  5,000 Units Subcutaneous Q8H   ibuprofen  600 mg Oral Q6H   rosuvastatin  40 mg Oral Daily   sodium hypochlorite   Irrigation BID    Continuous Infusions:  sodium chloride 250 mL (09/04/21 1111)   DAPTOmycin (CUBICIN) 500 mg in sodium chloride 0.9 % IVPB     metronidazole Stopped (09/04/21 1047)   norepinephrine (LEVOPHED) Adult infusion 7 mcg/min (09/04/21 1135)   phenylephrine (NEO-SYNEPHRINE) Adult infusion 75 mcg/min (09/04/21 1135)   PRN Meds:.famotidine, prochlorperazine   OBJECTIVE: Blood pressure 114/78, pulse 96, temperature 98.6 F (37 C), temperature source Oral, resp. rate 19, SpO2 96 %.  Physical Exam General/constitutional: no distress, pleasant, conversant HEENT: Normocephalic, PER, Conj Clear, EOMI, Oropharynx clear Neck supple CV: rrr no mrg Lungs: clear to auscultation, normal respiratory effort Abd: Soft, Nontender Ext: no edema Skin: No Rash Neuro: nonfocal MSK: atrophic bilateral LE (scar left lateral hip prior hardware long ago removed); sinus tract left hip and urethrocutaneous fistula visualized; no purulence but serosanguinous output out from left hip sinus tract Gu: spc in place; site without purulence     Lab Results Lab Results  Component Value Date   WBC 15.5 (H) 09/04/2021   HGB 8.5 (L) 09/04/2021   HCT 26.8 (L) 09/04/2021   MCV 81.7 09/04/2021   PLT 304 09/04/2021    Lab Results  Component Value Date   CREATININE 1.17 09/04/2021   BUN 15 09/04/2021   NA 137 09/04/2021   K 3.0 (L) 09/04/2021   CL 103 09/04/2021   CO2 26 09/04/2021    Lab Results  Component Value Date   ALT 55 (H) 09/04/2021   AST 70 (H) 09/04/2021   ALKPHOS 83 09/04/2021   BILITOT 0.3 09/04/2021      Microbiology: Recent Results (from the past 240 hour(s))  Resp Panel by RT-PCR (Flu A&B, Covid) Anterior Nasal Swab     Status: None   Collection Time: 09/03/21  3:36 PM   Specimen: Anterior Nasal Swab  Result Value Ref Range Status   SARS Coronavirus 2 by RT PCR NEGATIVE NEGATIVE Final    Comment: (NOTE) SARS-CoV-2 target nucleic acids are NOT DETECTED.  The SARS-CoV-2 RNA is  generally detectable in upper respiratory specimens during the acute phase of infection. The lowest concentration of SARS-CoV-2 viral copies this assay can detect is 138 copies/mL. A negative result does not preclude SARS-Cov-2 infection and should not be used as the sole basis for treatment or other patient management decisions. A negative result may occur with  improper specimen collection/handling, submission of specimen other than nasopharyngeal swab, presence of viral mutation(s) within the areas targeted by this assay, and inadequate number of viral copies(<138 copies/mL). A negative result must be combined with clinical observations, patient history, and epidemiological information. The expected result is Negative.  Fact Sheet for Patients:  BloggerCourse.com  Fact Sheet for Healthcare Providers:  SeriousBroker.it  This test is no t yet approved or cleared by the [place] FDA and  has been  authorized for detection and/or diagnosis of SARS-CoV-2 by FDA under an Emergency Use Authorization (EUA). This EUA will remain  in effect (meaning this test can be used) for the duration of the COVID-19 declaration under Section 564(b)(1) of the Act, 21 U.S.C.section 360bbb-3(b)(1), unless the authorization is terminated  or revoked sooner.       Influenza A by PCR NEGATIVE NEGATIVE Final   Influenza B by PCR NEGATIVE NEGATIVE Final    Comment: (NOTE) The Xpert Xpress SARS-CoV-2/FLU/RSV plus assay is intended as an aid in the diagnosis of influenza from Nasopharyngeal swab specimens and should not be used as a sole basis for treatment. Nasal washings and aspirates are unacceptable for Xpert Xpress SARS-CoV-2/FLU/RSV testing.  Fact Sheet for Patients: BloggerCourse.com  Fact Sheet for Healthcare Providers: SeriousBroker.it  This test is not yet approved or cleared by the [place] FDA and has been authorized for detection and/or diagnosis of SARS-CoV-2 by FDA under an Emergency Use Authorization (EUA). This EUA will remain in effect (meaning this test can be used) for the duration of the COVID-19 declaration under Section 564(b)(1) of the Act, 21 U.S.C. section 360bbb-3(b)(1), unless the authorization is terminated or revoked.  Performed at [facility], [address], [place], [place] [address]   Urine Culture     Status: Abnormal (Preliminary result)   Collection Time: 09/03/21  3:36 PM   Specimen: In/Out Cath Urine  Result Value Ref Range Status   Specimen Description IN/OUT CATH URINE  Final   Special Requests   Final    NONE Performed at [facility], [address], [place], [place] [address]    Culture >=100,000 COLONIES/mL ENTEROCOCCUS FAECALIS (A)  Final   Report Status PENDING  Incomplete  Blood Culture (routine x 2)     Status: None (Preliminary result)   Collection Time: 09/03/21  4:18 PM   Specimen: BLOOD  Result Value Ref Range Status   Specimen Description BLOOD SITE NOT SPECIFIED  Final   Special Requests   Final    BOTTLES DRAWN AEROBIC AND ANAEROBIC Blood Culture adequate volume   Culture   Final    NO GROWTH < 24 HOURS Performed at [facility], [address], [place], [place] [address]    Report Status PENDING  Incomplete     Serology:    Imaging: If present, new imagings (plain films, ct scans, and mri) have been personally visualized and interpreted; radiology reports have been reviewed. Decision making incorporated into the Impression / Recommendations.  8/15 abd pelv ct with contrast Multiple large bladder stones are noted. Urinary bladder is decompressed secondary to Foley catheter.   Continued presence of large decubitus ulcer seen involving the left buttocks which is not significantly changed compared to prior exam. It is seen to extend to the base of the penis.   Stable chronic changes  are seen involving both hip joints consistent with hip dysplasia and heterotopic bone formation. However, there is now noted air within the left hip joint concerning for possible septic arthritis.  Raymondo Band, MD [facility] [phone] pager    09/04/2021, 4:23 PM

## 2021-09-04 NOTE — ED Notes (Signed)
Contacted provider regardings pts hypoytension along with tachycardia

## 2021-09-04 NOTE — Progress Notes (Addendum)
Interim Progress Note  Received message that Mr. Gloss had spiked another fever to 101.4. Still tachycardic and BP's still soft.   In to see patient with Dr. Melissa Noon and Dr. Jodie Echevaria. Patient states that he started experiencing chills again. Chills cause the patient a great deal of discomfort, he describes them as "the worst pain ever". He is requesting that he get scheduled Tylenol/Ibuprofen to try to prevent them.   The chills have since gone away. He is feeling better at this time.   He confirms that he lives alone and has been self-managing his wounds.   O: Blood pressure (!) 88/49, pulse (!) 121, temperature (!) 101.4 F (38.6 C), resp. rate 16, SpO2 94 %. Repeat BP 86/56 with MAP 66 Gen: Paraplegic, NAD, pleasant male laying in bed  Resp: normal effort Cardio: tachycardic Skin: sacral decubitus ulcer noted and left gluteal ulcer seen   A/P Severe Sepsis IVF were off when we went to the bedside. Unclear how long they may have been off but we discussed with RN who turned them back on. He still has albumin running. BP's still soft but he is maintaining MAPs. Team messaged Charma Igo PA-C with Ortho who recommends IR consult for aspiration.  -Broaden abx to cefepime, flagyl and daptomycin  -Schedule Tylenol and Ibuprofen q6h  -Monitor BP's, goal MAP >65  -IR consult placed -Low threshold to re-involve CCM if hemodynamic instability   850 AM Addendum: RN advised that BP still soft. Last BP 78/58 with MAP 57. I have reconsulted CCM who will come to evaluate patient.

## 2021-09-04 NOTE — ED Notes (Signed)
Contacted provider regardings pts hypotension worsening along with continued tachycardia

## 2021-09-04 NOTE — Progress Notes (Signed)
Brief PCCM Progress Note  Discussed with Dr. Silverio Lay in ED, hypotensive with severe sepsis with suspected SSTI involving left sacral decubitus ulcer. Has received 4L crystalloid. He is in no distress, has no dyspnea on RA apart from some discomfort from his positioning. Bedside US with normal size IVC but collapsible, lungs without B-lines. He is hypoalbuminemic. At least appears fluid tolerant still. Ordered 25g 5% albumin. If still hypotensive afterward happy to be reinvolved.  Anthony [name] Pulmonary/Critical Care

## 2021-09-04 NOTE — Progress Notes (Signed)
Daily Progress Note Intern Pager: [phone]  Patient name: Anthony [name] Medical record number: [id number] Date of birth: 01/12/1971 Age: 51 y.o. Gender: male  Primary Care Provider: Precious Gilding, DO Consultants: ortho, IR, CCM Code Status: FULL  Pt Overview and Major Events to Date:  - 8/15 admitted for severe sepsis  Assessment and Plan: Anthony [name] is a 51 y.o. male presenting with severe sepsis now with shock, multiple possible infectious sources, purulent stage IV decubitus ulcer on L buttocks, possible L hip septic arthritis, UTI, now transferred to ICU for septic shock  * Severe sepsis ([place]) SIRS criteria for severe sepsis met including hypotension and lactic acidosis.  Hypotension improved after 3 L of LR, however pressure dropped again and he received 1 additional liter of LR.  Patient has multiple wounds (see pressure ulcer below for more details), however purulent stage IV decubitus ulcer on left buttocks is a likely source of infection. IR consult placed to aspirate joint for possible septic arthritis - Admitted to FMTS, attending Dr. Owens Shark - General surgery consult, does not recommend debridement as time - appreciate recs - Orthopedic consulted, and did not see need to intervene at this time - appreciate recs - Status post 4 L LR bolus - UA and microscopic suggestive of urinary tract infection.  UTI versus asymptomatic bacteremia as patient has chronic indwelling suprapubic catheter. - CT abdomen and pelvis showed unchanged large decubitus ulcer from previous exam.  However new air within the left hip joint was noted and is concerning for possible septic arthritis. - Broaden abx to flagyl, daptomycin, and cefepime per pharmacy - Scheduled Tylenol and Ibuprofen q6h  - Monitor BP's, goal MAP >65  - Low threshold to re-involve CCM if hemodynamic instability - f/u IR aspiration - f/u ortho consult  Pressure ulcer Chronic stage IV pressure ulcer on left buttock,  stage III and stage II ulcers present in sacral region.  Additional wound on dorsum of right foot noted. Not followed by wound care. - Stage IV pressure ulcer is likely source of sepsis - Wound care consult - See severe sepsis above for more details  AKI (acute kidney injury) ([place]) Creatinine elevated to 2.34, baseline of 0.5-0.6 as observed from labs over the last 2 months. Likely pre-renal due to sepsis. eGFR of 33 at admission, baseline greater than 60. - S/P 4 L LR - Hold home flecainide due to partial renal elimination - Consider renal medications - Avoid nephrotoxic agents  Hypokalemia Potassium of 2.7 at admission. - Repleted with IV potassium (10 mEq x3) and PO potassium (40 mEq) in the ED - Pending magnesium  Prolonged Q-T interval on ECG QTc of 550 on most recent ECG.  On flecainide, known to cause prolongation. - Hold home flecainide - Avoid QTc prolonging agents -- check Mg --repeat EKG in AM  Essential hypertension Hypotensive on admission, related to severe sepsis.  - Hold home lisinopril - Status post 4 L LR    FEN/GI: NPO PPx: heparin Dispo: ICU   due to septic shock .  Subjective:  Went to see patient after nurse paged about hypotensive episodes. Patient states systolic blood pressure usually in the 110s, but otherwise feels okay - denies chest pain, shortness of breath, abdominal pain.  Objective: Temp:  [98.1 F (36.7 C)-102.8 F (39.3 C)] 101.4 F (38.6 C) (08/16 0744) Pulse Rate:  [86-143] 105 (08/16 1045) Resp:  [9-27] 16 (08/16 1045) BP: (69-129)/(42-79) 73/48 (08/16 1045) SpO2:  [75 %-100 %]  96 % (08/16 1045) Physical Exam: General: in no acute distress HEENT: normocephalic and atraumatic Cardiovascular:  tachycardic Respiratory: CTAB anteriorly, normal respiratory effort, and on RA Gastrointestinal: non-tender and non-distended Extremities: moving all extremities spontaneously Neuro: following commands and no focal neurological  deficits   Laboratory: Most recent CBC Lab Results  Component Value Date   WBC 15.5 (H) 09/04/2021   HGB 8.5 (L) 09/04/2021   HCT 26.8 (L) 09/04/2021   MCV 81.7 09/04/2021   PLT 304 09/04/2021   Most recent BMP    Latest Ref Rng & Units 09/04/2021    4:28 AM  BMP  Glucose 70 - 99 mg/dL 145   BUN 6 - 20 mg/dL 15   Creatinine 0.61 - 1.24 mg/dL 1.17   Sodium 135 - 145 mmol/L 137   Potassium 3.5 - 5.1 mmol/L 3.0   Chloride 98 - 111 mmol/L 103   CO2 22 - 32 mmol/L 26   Calcium 8.9 - 10.3 mg/dL 7.7     Imaging/Diagnostic Tests: CXR (8/15) IMPRESSION: No active disease.  CT Abd/Pelvis (8/15)  IMPRESSION: Multiple large bladder stones are noted. Urinary bladder is decompressed secondary to Foley catheter.   Continued presence of large decubitus ulcer seen involving the left buttocks which is not significantly changed compared to prior exam. It is seen to extend to the base of the penis.   Stable chronic changes are seen involving both hip joints consistent with hip dysplasia and heterotopic bone formation. However, there is now noted air within the left hip joint concerning for possible septic arthritis.   Camelia Phenes, MD 09/04/2021, 10:51 AM  PGY-1, [place] Intern pager: [phone], text pages welcome Secure chat group [place]

## 2021-09-04 NOTE — Consult Note (Signed)
NAME:  Anthony [name], MRN:  [id number], DOB:  18-Dec-1970, LOS: 0 ADMISSION DATE:  09/03/2021, CONSULTATION DATE: 09/04/2021 REFERRING MD: Teaching service, CHIEF COMPLAINT: Refractory shock  History of Present Illness:  51 year old male with paraplegia secondary to spina bifida with chronic sacral wounds that he cares for at home.  He presents with acute hypotension in the setting of chronic antihypertensives with suspected sepsis from sacral decubitus and hand proven refractory to fluid resuscitation.  Pulmonary critical care has been asked to evaluate despite 5 L and additional liter 2" 6+ albumin he remains hypotensive with a systolic in the 70s.  Therefore we will admit to the intensive care unit start low-dose peripheral vasopressors and continue to monitor.  He is on an appropriate antimicrobial therapy.  Pertinent  Medical History   Past Medical History:  Diagnosis Date   Chronic indwelling Foley catheter    PVCs (premature ventricular contractions)    Spina bifida    Wheelchair bound      Significant Hospital Events: Including procedures, antibiotic start and stop dates in addition to other pertinent events   Septic shock  Interim History / Subjective:  Has proven refractory to fluid resuscitation  Objective   Blood pressure (!) 78/48, pulse (!) 114, temperature (!) 101.4 F (38.6 C), resp. rate 18, SpO2 92 %.        Intake/Output Summary (Last 24 hours) at 09/04/2021 0949 Last data filed at 09/04/2021 0744 Gross per 24 hour  Intake 5029.83 ml  Output 300 ml  Net 4729.83 ml   There were no vitals filed for this visit.  Examination: General: Awake alert no acute distress HENT: Oral mucosa is noted to be dry no JVD Lungs: Diminished throughout Cardiovascular: Sinus tachycardia rate 118 Abdomen: Abdomen soft nontender Sacral decubitus noted     Extremities: Lower extremities contracted from chronic paraplegia Neuro: Grossly intact except for paraplegia GU:  Pubic catheter in place  Resolved Hospital Problem list     Assessment & Plan:  Shock in the setting of sepsis from chronic sacral wounds. Check procalcitonin Continue to monitor lactic acid Continue antimicrobial therapy Infectious disease consult Low-dose Levophed peripherally Continue fluid resuscitation Transferred to intensive care unit until stabilized  Chronic sacral wound Currently on antimicrobial therapy Surgeries been consulted no interventions at this time Wound ostomy care consult Consider palliative care consult  Acute lactic acidosis resolved Resolved  Paraplegic from spina bifida Monitor  Acute renal insufficiency Lab Results  Component Value Date   CREATININE 1.17 09/04/2021   CREATININE 1.57 (H) 09/03/2021   CREATININE 2.34 (H) 09/03/2021   CREATININE 1.00 03/09/2014   CREATININE 0.64 02/06/2014   CREATININE 0.60 03/25/2012   Resolved with fluid resuscitation  Chronic indwelling suprapubic catheter changed 2 weeks ago Continue to monitor  Best Practice (right click and "Reselect all SmartList Selections" daily)   Diet/type: Regular consistency (see orders) DVT prophylaxis: prophylactic heparin  GI prophylaxis: PPI Lines: N/A Foley:  N/A Code Status:  full code Last date of multidisciplinary goals of care discussion [tbd ]  Labs   CBC: Recent Labs  Lab 09/03/21 1618 09/03/21 2201 09/03/21 2355 09/04/21 0428  WBC 22.0* 18.6*  --  15.5*  NEUTROABS 19.9*  --   --   --   HGB 10.3* 6.7* 9.3* 8.5*  HCT 31.9* 20.9* 28.8* 26.8*  MCV 80.2 81.3  --  81.7  PLT 441* 342  --  304    Basic Metabolic Panel: Recent Labs  Lab 09/03/21 1618 09/03/21 2201  09/04/21 0428  NA 134*  --  137  K 2.7*  --  3.0*  CL 94*  --  103  CO2 21*  --  26  GLUCOSE 168*  --  145*  BUN 21*  --  15  CREATININE 2.34* 1.57* 1.17  CALCIUM 8.2*  --  7.7*  MG  --  1.3*  --    GFR: CrCl cannot be calculated (Unknown ideal weight.). Recent Labs  Lab  09/03/21 1618 09/03/21 1937 09/03/21 2201 09/04/21 0428  WBC 22.0*  --  18.6* 15.5*  LATICACIDVEN 3.8* 1.7  --   --     Liver Function Tests: Recent Labs  Lab 09/03/21 1618 09/04/21 0428  AST 74* 70*  ALT 68* 55*  ALKPHOS 105 83  BILITOT 0.7 0.3  PROT 7.4 5.4*  ALBUMIN 1.9* <1.5*   No results for input(s): "LIPASE", "AMYLASE" in the last 168 hours. No results for input(s): "AMMONIA" in the last 168 hours.  ABG    Component Value Date/Time   TCO2 25 01/24/2017 1634     Coagulation Profile: Recent Labs  Lab 09/03/21 1618  INR 1.6*    Cardiac Enzymes: Recent Labs  Lab 09/04/21 0428  CKTOTAL 92    HbA1C: No results found for: "HGBA1C"  CBG: No results for input(s): "GLUCAP" in the last 168 hours.  Review of Systems:   10 point review of system taken, please see HPI for positives and negatives.   Past Medical History:  He,  has a past medical history of Chronic indwelling Foley catheter, PVCs (premature ventricular contractions), Spina bifida, and Wheelchair bound.   Surgical History:   Past Surgical History:  Procedure Laterality Date   BACK SURGERY     HIP SURGERY     INCISION AND DRAINAGE OF WOUND N/A 01/24/2017   Procedure: IRRIGATION AND DEBRIDEMENT WOUND- BUTTOCK ABSCESS;  Surgeon: Almond Lint, MD;  Location: [facility];  Service: General;  Laterality: N/A;     Social History:   reports that he has never smoked. He has never used smokeless tobacco. He reports that he does not drink alcohol.   Family History:  His family history includes CAD in his father and maternal grandfather; Sudden Cardiac Death in his sister.   Allergies Allergies  Allergen Reactions   Vancomycin Itching     Home Medications  Prior to Admission medications   Medication Sig Start Date End Date Taking? Authorizing Provider  flecainide (TAMBOCOR) 50 MG tablet Take 1 tablet (50 mg total) by mouth 2 (two) times daily. 05/20/21  Yes Camnitz, Will Daphine Deutscher, MD   lisinopril-hydrochlorothiazide (ZESTORETIC) 20-25 MG tablet Take 1 tablet by mouth once daily 06/27/21  Yes Turner, Cornelious Bryant, MD  metoprolol succinate (TOPROL-XL) 50 MG 24 hr tablet Take 1 tablet (50 mg total) by mouth 2 (two) times daily. Take with or immediately following a meal. 04/26/21  Yes Turner, Cornelious Bryant, MD  Multiple Vitamin (MULTIVITAMIN WITH MINERALS) TABS tablet Take 1 tablet by mouth daily.   Yes [provider]  Omega-3 Fatty Acids (FISH OIL) 1000 MG CAPS Take 1 capsule by mouth 2 (two) times daily.   Yes [provider]  Potassium 99 MG TABS Take 1 tablet by mouth 2 (two) times daily.   Yes [provider]  rosuvastatin (CRESTOR) 40 MG tablet Take 1 tablet (40 mg total) by mouth daily. 04/29/21  Yes Quintella Reichert, MD  Catheters (FOLEY CATHETER 2-WAY) MISC Use as directed by urologist. 12/30/12   Benjamin Stain,  Grandville Silos, MD     Critical care time: 45 min   Brett Canales Kambryn Dapolito ACNP Acute Care Nurse Practitioner [facility] Pulmonary/Critical Care Please consult Amion 09/04/2021, 9:49 AM

## 2021-09-04 NOTE — Progress Notes (Addendum)
Pharmacy Antibiotic Note  Anthony [name] is a 51 y.o. male admitted on 09/03/2021 presenting with sepsis (fever, hypotension, elevated lactic acid) likely due to chronic decubitus wounds now with drainage.  Pharmacy has been consulted for daptomycin and cefepime dosing.  Team also ordered metronidazole. Noted patient is paraplegic with baseline Scr ~0.6.   Per chart review, weight is ~170-190 lbs. Scr improving rapidly with aggressive fluid resuscitation. CK wnl. NO plans for surgery. Will reduce daptomycin to 6mg /kg given SSTI.   Plan: Change Daptomycin to 500mg  q24h Start cefepime 2g q8 hr Continue metronidazole per team Monitor renal function, QFri CK, cultures, LOT    Temp (24hrs), Avg:100.1 F (37.8 C), Min:98.1 F (36.7 C), Max:102.8 F (39.3 C)  Recent Labs  Lab 09/03/21 1618 09/03/21 1937 09/03/21 2201 09/04/21 0428  WBC 22.0*  --  18.6* 15.5*  CREATININE 2.34*  --  1.57* 1.17  LATICACIDVEN 3.8* 1.7  --   --      CrCl cannot be calculated (Unknown ideal weight.).    Allergies  Allergen Reactions   Vancomycin Itching   Antimicrobials: Dapto 8/15 >>  Cefe 8/16 >> MTX 8/16 >>  CTX 8/15  Microbiology  8/15 UA small bili, large hgb, mod leuk, +nitrite, prot 100, WBC > 50 8/15 Ucx pending 8/15 Bcx pending  9/15, PharmD, BCPS, [facility] Clinical Pharmacist  Please check AMION for all [facility] Pharmacy phone numbers After 10:00 PM, call Main Pharmacy [phone]

## 2021-09-04 NOTE — Progress Notes (Signed)
Patient's wounds are clean with no needs for surgical debridement.  Osteomyelitis is not treated surgically, but by abx therapy.  Defer to wound care for further wound recommendations.  We will sign off at this time.  Anthony [name] 7:16 AM 09/04/2021

## 2021-09-04 NOTE — ED Notes (Signed)
Report given to Jannette Spanner, RN [phone]

## 2021-09-04 NOTE — Evaluation (Signed)
Occupational Therapy Evaluation Patient Details Name: ADEEL GUIFFRE MRN: [id number] DOB: [birth year] Today's Date: 09/04/2021   History of Present Illness Pt is a 51 y/o male admitted secondary to sepsis likely from chronic wounds. PMH includes  spina bifida and paraplegia, neurogenic bladder with suprapubic catheter, complicated by chronic sacral wounds, chronic hip wound.   Clinical Impression   Pt admitted for concerns listed above. PTA pt reported that he was independent with all ADL' s and IADL's. At this time, pt is limited by weakness and balance deficits. He is needing increased assist for ADL's due to weakness. Unable to work on transfers this session due to low BP's (75/49), with associated dizziness and flushed face. Recommending SNF at this time to assist with managing all wounds, and improving independence and safety. OT will follow acutely.      Recommendations for follow up therapy are one component of a multi-disciplinary discharge planning process, led by the attending physician.  Recommendations may be updated based on patient status, additional functional criteria and insurance authorization.   Follow Up Recommendations  Skilled nursing-short term rehab (<3 hours/day)    Assistance Recommended at Discharge Intermittent Supervision/Assistance  Patient can return home with the following A lot of help with walking and/or transfers;A lot of help with bathing/dressing/bathroom;Assistance with cooking/housework;Help with stairs or ramp for entrance    Functional Status Assessment  Patient has had a recent decline in their functional status and demonstrates the ability to make significant improvements in function in a reasonable and predictable amount of time.  Equipment Recommendations  Tub/shower bench    Recommendations for Other Services       Precautions / Restrictions Precautions Precautions: Fall Restrictions Weight Bearing Restrictions: No      Mobility Bed  Mobility Overal bed mobility: Needs Assistance Bed Mobility: Supine to Sit     Supine to sit: Mod assist     General bed mobility comments: Assist for trunk to come to long sitting. Checked BP in sitting and it was 75/49. Further mobility deferred.    Transfers                          Balance Overall balance assessment: Needs assistance Sitting-balance support: Bilateral upper extremity supported Sitting balance-Leahy Scale: Poor Sitting balance - Comments: Reliant on UE support                                   ADL either performed or assessed with clinical judgement   ADL Overall ADL's : Needs assistance/impaired Eating/Feeding: Set up;Sitting   Grooming: Set up;Sitting   Upper Body Bathing: Set up;Sitting   Lower Body Bathing: Minimal assistance;Sitting/lateral leans   Upper Body Dressing : Set up;Sitting   Lower Body Dressing: Minimal assistance;Sitting/lateral leans;Sit to/from stand   Toilet Transfer: Minimal assistance;Moderate assistance;Transfer board   Toileting- Clothing Manipulation and Hygiene: Minimal assistance;Sitting/lateral lean         General ADL Comments: Pt limited this session by low BP, additionally, pt is globally weak, will require assist for ADL's at this time     Vision Baseline Vision/History: 0 No visual deficits Ability to See in Adequate Light: 0 Adequate Patient Visual Report: No change from baseline Vision Assessment?: No apparent visual deficits     Perception     Praxis      Pertinent Vitals/Pain Pain Assessment Pain Assessment: Faces Faces Pain Scale: Hurts  a little bit Pain Location: wounds Pain Descriptors / Indicators: Grimacing, Guarding Pain Intervention(s): Limited activity within patient's tolerance, Monitored during session, Repositioned     Hand Dominance     Extremity/Trunk Assessment Upper Extremity Assessment Upper Extremity Assessment: Overall WFL for tasks assessed    Lower Extremity Assessment Lower Extremity Assessment: RLE deficits/detail;LLE deficits/detail RLE Deficits / Details: No AROM at baseline secondary to spina bifida LLE Deficits / Details: No AROM at baseline secondary to spina bifida   Cervical / Trunk Assessment Cervical / Trunk Assessment: Kyphotic   Communication Communication Communication: No difficulties   Cognition Arousal/Alertness: Awake/alert Behavior During Therapy: WFL for tasks assessed/performed Overall Cognitive Status: No family/caregiver present to determine baseline cognitive functioning                                 General Comments: Decreased safety awareness     General Comments  VSS on RA    Exercises     Shoulder Instructions      Home Living Family/patient expects to be discharged to:: Private residence Living Arrangements: Alone Available Help at Discharge: Family;Available PRN/intermittently Type of Home: Apartment Home Access: Ramped entrance     Home Layout: One level     Bathroom Shower/Tub: Chief Strategy Officer: Standard     Home Equipment: Wheelchair - manual;Grab bars - tub/shower;Transport chair          Prior Functioning/Environment Prior Level of Function : Independent/Modified Independent             Mobility Comments: able to perform transfers in/ouyt of his WC without difficulty ADLs Comments: sits in the tub for bathing        OT Problem List: Decreased strength;Decreased activity tolerance;Impaired balance (sitting and/or standing);Decreased safety awareness;Impaired sensation      OT Treatment/Interventions: Self-care/ADL training;Therapeutic exercise;Energy conservation;DME and/or AE instruction;Therapeutic activities;Patient/family education;Balance training    OT Goals(Current goals can be found in the care plan section) Acute Rehab OT Goals Patient Stated Goal: To go home OT Goal Formulation: With patient Time For Goal  Achievement: 09/18/21 Potential to Achieve Goals: Good  OT Frequency: Min 2X/week    Co-evaluation              AM-PAC OT "6 Clicks" Daily Activity     Outcome Measure Help from another person eating meals?: A Little Help from another person taking care of personal grooming?: A Little Help from another person toileting, which includes using toliet, bedpan, or urinal?: A Little Help from another person bathing (including washing, rinsing, drying)?: A Little Help from another person to put on and taking off regular upper body clothing?: A Little Help from another person to put on and taking off regular lower body clothing?: A Little 6 Click Score: 18   End of Session Nurse Communication: Mobility status  Activity Tolerance: Patient tolerated treatment well Patient left: in bed  OT Visit Diagnosis: Other abnormalities of gait and mobility (R26.89);Unsteadiness on feet (R26.81);Muscle weakness (generalized) (M62.81)                Time: 9833-8250 OT Time Calculation (min): 18 min Charges:  OT General Charges $OT Visit: 1 Visit OT Evaluation $OT Eval Moderate Complexity: 1 Mod  Graylee Arutyunyan H., OTR/L Acute Rehabilitation  Thadd Apuzzo Elane Bing Plume 09/04/2021, 1:25 PM

## 2021-09-04 NOTE — Evaluation (Signed)
Physical Therapy Evaluation Patient Details Name: Anthony [name] MRN: [id number] DOB: July 03, 1970 Today's Date: 09/04/2021  History of Present Illness  Pt is a 51 y/o male admitted secondary to sepsis likely from chronic wounds. PMH includes  spina bifida and paraplegia, neurogenic bladder with suprapubic catheter, complicated by chronic sacral wounds, chronic hip wound.  Clinical Impression  Pt admitted secondary to problem above with deficits below. Pt requiring mod A to perform long sitting at EOB. Pt with hypotension upon sitting at 75/49, so further mobility deferred. Pt currently lives alone and reports he feels weaker. Feel pt would benefit from SNF level therapies prior to return home to ensure safety with mobility tasks. However, pt may refuse; if pt refuses recommending max HH services. Will continue to follow acutely.        Recommendations for follow up therapy are one component of a multi-disciplinary discharge planning process, led by the attending physician.  Recommendations may be updated based on patient status, additional functional criteria and insurance authorization.  Follow Up Recommendations Skilled nursing-short term rehab (<3 hours/day) Can patient physically be transported by private vehicle: No    Assistance Recommended at Discharge Frequent or constant Supervision/Assistance  Patient can return home with the following  A lot of help with walking and/or transfers;A lot of help with bathing/dressing/bathroom;Assistance with cooking/housework;Assist for transportation    Equipment Recommendations None recommended by PT  Recommendations for Other Services       Functional Status Assessment Patient has had a recent decline in their functional status and demonstrates the ability to make significant improvements in function in a reasonable and predictable amount of time.     Precautions / Restrictions Precautions Precautions: Fall Restrictions Weight Bearing  Restrictions: No      Mobility  Bed Mobility Overal bed mobility: Needs Assistance Bed Mobility: Supine to Sit     Supine to sit: Mod assist     General bed mobility comments: Assist for trunk to come to long sitting. Checked BP in sitting and it was 75/49. Further mobility deferred.    Transfers                        Ambulation/Gait                  Stairs            Wheelchair Mobility    Modified Rankin (Stroke Patients Only)       Balance Overall balance assessment: Needs assistance Sitting-balance support: Bilateral upper extremity supported Sitting balance-Leahy Scale: Poor Sitting balance - Comments: Reliant on UE support                                     Pertinent Vitals/Pain Pain Assessment Pain Assessment: Faces Faces Pain Scale: Hurts a little bit Pain Location: wounds Pain Descriptors / Indicators: Grimacing, Guarding Pain Intervention(s): Limited activity within patient's tolerance, Monitored during session, Repositioned    Home Living Family/patient expects to be discharged to:: Private residence Living Arrangements: Alone Available Help at Discharge: Family;Available PRN/intermittently Type of Home: Apartment Home Access: Ramped entrance       Home Layout: One level Home Equipment: Wheelchair - manual;Grab bars - tub/shower;Transport chair      Prior Function Prior Level of Function : Independent/Modified Independent             Mobility Comments: able to perform transfers  in/ouyt of his WC without difficulty ADLs Comments: sits in the tub for bathing     Hand Dominance        Extremity/Trunk Assessment   Upper Extremity Assessment Upper Extremity Assessment: Defer to OT evaluation    Lower Extremity Assessment Lower Extremity Assessment: RLE deficits/detail;LLE deficits/detail RLE Deficits / Details: No AROM at baseline secondary to spina bifida LLE Deficits / Details: No AROM  at baseline secondary to spina bifida    Cervical / Trunk Assessment Cervical / Trunk Assessment: Kyphotic  Communication   Communication: No difficulties  Cognition Arousal/Alertness: Awake/alert Behavior During Therapy: WFL for tasks assessed/performed Overall Cognitive Status: No family/caregiver present to determine baseline cognitive functioning                                 General Comments: Decreased safety awareness        General Comments      Exercises     Assessment/Plan    PT Assessment Patient needs continued PT services  PT Problem List Decreased strength;Decreased activity tolerance;Decreased mobility;Decreased balance;Decreased safety awareness;Decreased knowledge of use of DME;Decreased knowledge of precautions       PT Treatment Interventions DME instruction;Functional mobility training;Therapeutic activities;Therapeutic exercise;Balance training;Patient/family education;Wheelchair mobility training    PT Goals (Current goals can be found in the Care Plan section)  Acute Rehab PT Goals Patient Stated Goal: to go home PT Goal Formulation: With patient Time For Goal Achievement: 09/18/21 Potential to Achieve Goals: Fair    Frequency Min 3X/week     Co-evaluation               AM-PAC PT "6 Clicks" Mobility  Outcome Measure Help needed turning from your back to your side while in a flat bed without using bedrails?: A Lot Help needed moving from lying on your back to sitting on the side of a flat bed without using bedrails?: A Lot Help needed moving to and from a bed to a chair (including a wheelchair)?: Total Help needed standing up from a chair using your arms (e.g., wheelchair or bedside chair)?: Total Help needed to walk in hospital room?: Total Help needed climbing 3-5 steps with a railing? : Total 6 Click Score: 8    End of Session   Activity Tolerance: Treatment limited secondary to medical complications (Comment)  (hypotension) Patient left: in bed;with call bell/phone within reach (on stretcher in ED) Nurse Communication: Mobility status PT Visit Diagnosis: Unsteadiness on feet (R26.81);Muscle weakness (generalized) (M62.81);Difficulty in walking, not elsewhere classified (R26.2)    Time: 5732-2025 PT Time Calculation (min) (ACUTE ONLY): 16 min   Charges:   PT Evaluation $PT Eval Moderate Complexity: 1 Mod          Farley Ly, PT, DPT  Acute Rehabilitation Services  Office: [phone]   Anthony [name] 09/04/2021, 12:20 PM

## 2021-09-05 DIAGNOSIS — M869 Osteomyelitis, unspecified: Secondary | ICD-10-CM | POA: Diagnosis not present

## 2021-09-05 DIAGNOSIS — R6520 Severe sepsis without septic shock: Secondary | ICD-10-CM | POA: Diagnosis not present

## 2021-09-05 DIAGNOSIS — A419 Sepsis, unspecified organism: Secondary | ICD-10-CM | POA: Diagnosis not present

## 2021-09-05 DIAGNOSIS — M009 Pyogenic arthritis, unspecified: Secondary | ICD-10-CM | POA: Diagnosis not present

## 2021-09-05 LAB — CBC
HCT: 27.9 % — ABNORMAL LOW (ref 39.0–52.0)
Hemoglobin: 8.8 g/dL — ABNORMAL LOW (ref 13.0–17.0)
MCH: 26 pg (ref 26.0–34.0)
MCHC: 31.5 g/dL (ref 30.0–36.0)
MCV: 82.3 fL (ref 80.0–100.0)
Platelets: 229 10*3/uL (ref 150–400)
RBC: 3.39 MIL/uL — ABNORMAL LOW (ref 4.22–5.81)
RDW: 14.9 % (ref 11.5–15.5)
WBC: 10.8 10*3/uL — ABNORMAL HIGH (ref 4.0–10.5)
nRBC: 0 % (ref 0.0–0.2)

## 2021-09-05 LAB — BASIC METABOLIC PANEL
Anion gap: 7 (ref 5–15)
BUN: 16 mg/dL (ref 6–20)
CO2: 25 mmol/L (ref 22–32)
Calcium: 7.5 mg/dL — ABNORMAL LOW (ref 8.9–10.3)
Chloride: 108 mmol/L (ref 98–111)
Creatinine, Ser: 0.85 mg/dL (ref 0.61–1.24)
GFR, Estimated: >60 mL/min (ref >60)
Glucose, Bld: 150 mg/dL — ABNORMAL HIGH (ref 70–99)
Potassium: 3.3 mmol/L — ABNORMAL LOW (ref 3.5–5.1)
Sodium: 140 mmol/L (ref 135–145)

## 2021-09-05 LAB — MAGNESIUM: Magnesium: 2.3 mg/dL (ref 1.7–2.4)

## 2021-09-05 LAB — PROCALCITONIN: Procalcitonin: 39.12 ng/mL

## 2021-09-05 LAB — PHOSPHORUS: Phosphorus: 1.4 mg/dL — ABNORMAL LOW (ref 2.5–4.6)

## 2021-09-05 LAB — MRSA NEXT GEN BY PCR, NASAL: MRSA by PCR Next Gen: NOT DETECTED

## 2021-09-05 MED ORDER — ENOXAPARIN SODIUM 40 MG/0.4ML IJ SOSY
40.0000 mg | PREFILLED_SYRINGE | Freq: Every day | INTRAMUSCULAR | Status: DC
  Administered 2021-09-06 – 2021-09-10 (×5): 40 mg via SUBCUTANEOUS
  Filled 2021-09-05 (×5): qty 0.4

## 2021-09-05 MED ORDER — POTASSIUM PHOSPHATES 15 MMOLE/5ML IV SOLN
30.0000 mmol | Freq: Once | INTRAVENOUS | Status: AC
  Administered 2021-09-05: 30 mmol via INTRAVENOUS
  Filled 2021-09-05: qty 10

## 2021-09-05 MED ORDER — SODIUM CHLORIDE 0.9 % IV SOLN
8.0000 mg/kg | Freq: Every day | INTRAVENOUS | Status: DC
  Administered 2021-09-05 – 2021-09-08 (×4): 700 mg via INTRAVENOUS
  Filled 2021-09-05 (×5): qty 14

## 2021-09-05 MED ORDER — ORAL CARE MOUTH RINSE: 15.0000 mL | OROMUCOSAL | Status: DC | PRN

## 2021-09-05 MED ORDER — LACTATED RINGERS IV BOLUS
500.0000 mL | Freq: Once | INTRAVENOUS | Status: AC
  Administered 2021-09-05: 500 mL via INTRAVENOUS

## 2021-09-05 MED ORDER — POTASSIUM CHLORIDE 20 MEQ PO PACK
20.0000 meq | PACK | Freq: Once | ORAL | Status: AC
  Administered 2021-09-05: 20 meq via ORAL
  Filled 2021-09-05: qty 1

## 2021-09-05 MED ORDER — ACETAMINOPHEN 325 MG PO TABS
650.0000 mg | ORAL_TABLET | Freq: Four times a day (QID) | ORAL | Status: DC | PRN
  Administered 2021-09-05 – 2021-09-11 (×15): 650 mg via ORAL
  Filled 2021-09-05 (×15): qty 2

## 2021-09-05 MED ORDER — LIP MEDEX EX OINT
TOPICAL_OINTMENT | CUTANEOUS | Status: DC | PRN
Start: 2021-09-05 — End: 2021-09-24
  Filled 2021-09-05: qty 7

## 2021-09-05 NOTE — Progress Notes (Signed)
Pharmacy Antibiotic Note  Anthony [name] is a 51 y.o. male admitted on 09/03/2021 presenting with sepsis (fever, hypotension, elevated lactic acid) likely due to chronic decubitus wounds now with drainage. 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 Further has chronic draining let hip sinus tract with CT pelvis demonstrating air tracking.  Pharmacy has been consulted for daptomycin dosing. Noted patient is paraplegic with baseline Scr ~0.6.   Per chart review, weight is ~170-190 lbs. Scr improving rapidly with aggressive fluid resuscitation. CK wnl. NO plans for surgery. Patient has been admitted to the ICU for HoTN necessitating peripheral pressors.    Will empirically increase daptomycin to 8 mg/kg actual bodyweight as his urine culture is now growing VRE. If VRE were to grow in his bloodstream, patient would require another dose increase to 10-12 mg/kg.   Plan: Change Daptomycin to 700mg  q24h CONTINUE Ceftriaxone 2g IV Q24H  CONTINUE metronidazole 500 mg IV Q12H  Monitor renal function, QFri CK, cultures, LOT Weight: 86.2 kg (190 lb)  Temp (24hrs), Avg:98.2 F (36.8 C), Min:97.9 F (36.6 C), Max:98.6 F (37 C)  Recent Labs  Lab 09/03/21 1618 09/03/21 1937 09/03/21 2201 09/04/21 0428 09/05/21 0609  WBC 22.0*  --  18.6* 15.5* 10.8*  CREATININE 2.34*  --  1.57* 1.17 0.85  LATICACIDVEN 3.8* 1.7  --   --   --      Estimated Creatinine Clearance: 109.8 mL/min (by C-G formula based on SCr of 0.85 mg/dL).    Allergies  Allergen Reactions   Vancomycin Itching   Antimicrobials: Dapto 8/15 >>  MTZ 8/16 >>  CTX 8/15>>  Microbiology  8/15 Ucx: VRE (S-ampicillin)  8/15 Bcx NGTD   9/15, PharmD PGY-2 Infectious Diseases Resident  09/05/2021 9:33 AM

## 2021-09-05 NOTE — Consult Note (Signed)
WOC consulted today 8/17, patient was seen by [facility] nurse 8/16, orders are in the computer for all wounds and extensive note in the computer. See consult notes.   Anthony [name] [facility], CNS, [employer] [phone]

## 2021-09-05 NOTE — Progress Notes (Signed)
Physical Therapy Treatment Patient Details Name: Anthony [name] MRN: [id number] DOB: 1970/08/08 Today's Date: 09/05/2021   History of Present Illness Pt is a 51 y/o male admitted secondary to sepsis likely from chronic wounds. PMH includes  spina bifida and paraplegia, neurogenic bladder with suprapubic catheter, complicated by chronic sacral wounds, chronic hip wound.    PT Comments    Pt was reluctant to participate, preferring to do his own thing.  Pt's w/c though is not acceptable and totally unsafe.  Emphasis on general mobility today with transitions, scoot transfers, showing ability to move without shear on the w/c.  Pt wishes to have input on a new high strength, lightweight chair with cushion.   Recommendations for follow up therapy are one component of a multi-disciplinary discharge planning process, led by the attending physician.  Recommendations may be updated based on patient status, additional functional criteria and insurance authorization.  Follow Up Recommendations  Skilled nursing-short term rehab (<3 hours/Anthony) (pt will refuse) Can patient physically be transported by private vehicle: No   Assistance Recommended at Discharge Intermittent Supervision/Assistance  Patient can return home with the following A little help with walking and/or transfers;A little help with bathing/dressing/bathroom;Assistance with cooking/housework;Assist for transportation;Help with stairs or ramp for entrance   Equipment Recommendations  None recommended by PT    Recommendations for Other Services       Precautions / Restrictions Precautions Precautions: Fall     Mobility  Bed Mobility Overal bed mobility: Needs Assistance Bed Mobility: Supine to Sit, Sit to Supine     Supine to sit: HOB elevated, Supervision Sit to supine: Supervision   General bed mobility comments: boosted/scooted  with bil UE to EOB in prep for transfer.    Transfers Overall transfer level: Needs  assistance   Transfers: Bed to chair/wheelchair/BSC            Lateral/Scoot Transfers: Min guard General transfer comment: pt scoots via small scoots in which pt clears obstacles without shear or rests on obstacles briefly before continuing without shear.    Ambulation/Gait               General Gait Details: N/A   Stairs             Wheelchair Mobility    Modified Rankin (Stroke Patients Only)       Balance Overall balance assessment: Needs assistance Sitting-balance support: Single extremity supported, Bilateral upper extremity supported, No upper extremity supported Sitting balance-Leahy Scale: Fair                                      Cognition Arousal/Alertness: Awake/alert Behavior During Therapy: WFL for tasks assessed/performed Overall Cognitive Status: No family/caregiver present to determine baseline cognitive functioning (expect at his baseline)                                 General Comments: Decreased safety awareness        Exercises      General Comments General comments (skin integrity, edema, etc.): The personal w/c pt used for transfer today is not safe for anyone to use and pt needs a new one.  Light weight sturdy, with firm tubed wheels, metal wheel grips, new cushion.      Pertinent Vitals/Pain Pain Assessment Pain Assessment: Faces Faces Pain Scale: No hurt Pain Intervention(s): Monitored  during session    Home Living                          Prior Function            PT Goals (current goals can now be found in the care plan section) Acute Rehab PT Goals Patient Stated Goal: to go home PT Goal Formulation: With patient Time For Goal Achievement: 09/18/21 Potential to Achieve Goals: Fair Progress towards PT goals: Progressing toward goals    Frequency    Min 2X/week      PT Plan Current plan remains appropriate (pt will refuse all but going directly home)     Co-evaluation              AM-PAC PT "6 Clicks" Mobility   Outcome Measure  Help needed turning from your back to your side while in a flat bed without using bedrails?: A Little Help needed moving from lying on your back to sitting on the side of a flat bed without using bedrails?: A Little Help needed moving to and from a bed to a chair (including a wheelchair)?: A Little Help needed standing up from a chair using your arms (e.g., wheelchair or bedside chair)?: Total Help needed to walk in hospital room?: Total Help needed climbing 3-5 steps with a railing? : Total 6 Click Score: 12    End of Session   Activity Tolerance: Patient tolerated treatment well Patient left: Other (comment);with call bell/phone within reach (w/c) Nurse Communication: Mobility status PT Visit Diagnosis: Muscle weakness (generalized) (M62.81);Other abnormalities of gait and mobility (R26.89)     Time: 1355-1436 PT Time Calculation (min) (ACUTE ONLY): 41 min  Charges:  $Therapeutic Activity: 23-37 mins $Wheel Chair Management: 8-22 mins                     09/05/2021  Jacinto Halim., PT Acute Rehabilitation Services [phone]  (pager) [phone]  (office)   Eliseo Gum Fletcher Ostermiller 09/05/2021, 5:05 PM

## 2021-09-05 NOTE — Progress Notes (Addendum)
NAME:  AALIJAH LANPHERE, MRN:  [id number], DOB:  [birth year], LOS: 1 ADMISSION DATE:  09/03/2021, CONSULTATION DATE: 09/04/2021 REFERRING MD: Teaching service, CHIEF COMPLAINT: Refractory shock  History of Present Illness:  51 year old male with paraplegia secondary to spina bifida with chronic sacral wounds that he cares for at home.  He presents with acute hypotension in the setting of chronic antihypertensives with suspected sepsis from sacral decubitus and hand proven refractory to fluid resuscitation.  Pulmonary critical care was asked to evaluate despite 5 L and additional liter 2" 6+ albumin he remained hypotensive with a systolic in the 70s and was therefore admitted to the intensive care unit to start low-dose peripheral vasopressors. He is on an appropriate antimicrobial therapy.  Pertinent  Medical History   Past Medical History:  Diagnosis Date   Chronic indwelling Foley catheter    PVCs (premature ventricular contractions)    Spina bifida    Wheelchair bound      Significant Hospital Events: Including procedures, antibiotic start and stop dates in addition to other pertinent events   Septic shock  Interim History / Subjective:  Has proven refractory to fluid resuscitation  Objective   Blood pressure 97/65, pulse 84, temperature 98.2 F (36.8 C), temperature source Axillary, resp. rate (!) 26, SpO2 94 %.        Intake/Output Summary (Last 24 hours) at 09/05/2021 0831 Last data filed at 09/05/2021 0600 Gross per 24 hour  Intake 2802.49 ml  Output 415 ml  Net 2387.49 ml   There were no vitals filed for this visit.  Examination: General: Awake alert no acute distress HENT: MMM Lungs: CTABL throughout Cardiovascular: RRR, NRMG Abdomen: Abdomen soft nontender, slight pressure in suprapubic region Sacral decubitus noted, no erythema, no swelling, no pus   Extremities: Lower extremities contracted from chronic paraplegia, ulcer on right foot, no drainage or erythema   Neuro: Grossly intact except for paraplegia GU: Pubic catheter in place  Resolved Hospital Problem list     Assessment & Plan:  Shock in the setting of sepsis from chronic sacral wounds. Patient BP better controlled overnight with MAP mostly > 65. Procal 47.11, lactic acid trending down at 1.7. UOP 715 cc yesterday, with 1.942.5 L intake. Patient off levophed for BP support. Patient afebrile overnight. WBC trend down to 10.8 from 15.5. Urine culture showed 100,000 colonies of vancomycin resistant enterococcus, with sensitivities to nitrofurantoin and ampicillin. Blood culture NGTD. MRSA swab negative. Ortho consulted and recommend no intervention for left hip pneumarthrosis. IR consulted and recommend no procedure at this time.  Continue antimicrobial therapy Infectious disease following, appreciate recs   Chronic sacral wound Currently on antimicrobial therapy (Dapto 8/16, CFTX 8/16, Metronidazole 8/16) ID following, appreciate recs Surgery has been consulted, no interventions at this time Wound care following, appreciate recs  Hypokalemia K 3.3 today, replete w/ 20 meq potasium tablet  Hypophosphatemia Ph 1.4, replete with 30 milimoles K-Phos  Acute lactic acidosis resolved Resolved  Paraplegic from spina bifida Monitor  Acute renal insufficiency Lab Results  Component Value Date   CREATININE 0.85 09/05/2021   CREATININE 1.17 09/04/2021   CREATININE 1.57 (H) 09/03/2021   CREATININE 1.00 03/09/2014   CREATININE 0.64 02/06/2014   CREATININE 0.60 03/25/2012  Cr 0.85 today, improved from 1.17 yesterday. Resolved with fluid resuscitation. Continue to monitor, baseline 0.60  Chronic indwelling suprapubic catheter changed 2 weeks ago Continue to monitor  Prolonged QT interval QTc 550 yesterday. Patient on flecainide. Will continue to monitor  Best Practice (right  click and "Reselect all SmartList Selections" daily)   Diet/type: Regular consistency (see orders) DVT  prophylaxis: prophylactic heparin  GI prophylaxis: PPI Lines: N/A Foley:  N/A Code Status:  full code Last date of multidisciplinary goals of care discussion [tbd]  Labs   CBC: Recent Labs  Lab 09/03/21 1618 09/03/21 2201 09/03/21 2355 09/04/21 0428 09/05/21 0609  WBC 22.0* 18.6*  --  15.5* 10.8*  NEUTROABS 19.9*  --   --   --   --   HGB 10.3* 6.7* 9.3* 8.5* 8.8*  HCT 31.9* 20.9* 28.8* 26.8* 27.9*  MCV 80.2 81.3  --  81.7 82.3  PLT 441* 342  --  304 229    Basic Metabolic Panel: Recent Labs  Lab 09/03/21 1618 09/03/21 2201 09/04/21 0428 09/05/21 0609  NA 134*  --  137 140  K 2.7*  --  3.0* 3.3*  CL 94*  --  103 108  CO2 21*  --  26 25  GLUCOSE 168*  --  145* 150*  BUN 21*  --  15 16  CREATININE 2.34* 1.57* 1.17 0.85  CALCIUM 8.2*  --  7.7* 7.5*  MG  --  1.3*  --  2.3  PHOS  --   --   --  1.4*   GFR: CrCl cannot be calculated (Unknown ideal weight.). Recent Labs  Lab 09/03/21 1618 09/03/21 1937 09/03/21 2201 09/04/21 0428 09/04/21 0430 09/05/21 0609  PROCALCITON  --   --   --   --  47.11  --   WBC 22.0*  --  18.6* 15.5*  --  10.8*  LATICACIDVEN 3.8* 1.7  --   --   --   --     Liver Function Tests: Recent Labs  Lab 09/03/21 1618 09/04/21 0428  AST 74* 70*  ALT 68* 55*  ALKPHOS 105 83  BILITOT 0.7 0.3  PROT 7.4 5.4*  ALBUMIN 1.9* <1.5*   No results for input(s): "LIPASE", "AMYLASE" in the last 168 hours. No results for input(s): "AMMONIA" in the last 168 hours.  ABG    Component Value Date/Time   TCO2 25 01/24/2017 1634     Coagulation Profile: Recent Labs  Lab 09/03/21 1618  INR 1.6*    Cardiac Enzymes: Recent Labs  Lab 09/04/21 0428  CKTOTAL 92    HbA1C: No results found for: "HGBA1C"  CBG: No results for input(s): "GLUCAP" in the last 168 hours.  Review of Systems:   10 point review of system taken, please see HPI for positives and negatives.   Past Medical History:  He,  has a past medical history of Chronic  indwelling Foley catheter, PVCs (premature ventricular contractions), Spina bifida, and Wheelchair bound.   Surgical History:   Past Surgical History:  Procedure Laterality Date   BACK SURGERY     HIP SURGERY     INCISION AND DRAINAGE OF WOUND N/A 01/24/2017   Procedure: IRRIGATION AND DEBRIDEMENT WOUND- BUTTOCK ABSCESS;  Surgeon: Almond Lint, MD;  Location: [facility];  Service: General;  Laterality: N/A;     Social History:   reports that he has never smoked. He has never used smokeless tobacco. He reports that he does not drink alcohol.   Family History:  His family history includes CAD in his father and maternal grandfather; Sudden Cardiac Death in his sister.   Allergies Allergies  Allergen Reactions   Vancomycin Itching     Home Medications  Prior to Admission medications   Medication Sig Start Date End  Date Taking? Authorizing Provider  flecainide (TAMBOCOR) 50 MG tablet Take 1 tablet (50 mg total) by mouth 2 (two) times daily. 05/20/21  Yes Camnitz, Will Hassell Done, MD  lisinopril-hydrochlorothiazide (ZESTORETIC) 20-25 MG tablet Take 1 tablet by mouth once daily 06/27/21  Yes Turner, Eber Hong, MD  metoprolol succinate (TOPROL-XL) 50 MG 24 hr tablet Take 1 tablet (50 mg total) by mouth 2 (two) times daily. Take with or immediately following a meal. 04/26/21  Yes Turner, Eber Hong, MD  Multiple Vitamin (MULTIVITAMIN WITH MINERALS) TABS tablet Take 1 tablet by mouth daily.   Yes [provider]  Omega-3 Fatty Acids (FISH OIL) 1000 MG CAPS Take 1 capsule by mouth 2 (two) times daily.   Yes [provider]  Potassium 99 MG TABS Take 1 tablet by mouth 2 (two) times daily.   Yes [provider]  rosuvastatin (CRESTOR) 40 MG tablet Take 1 tablet (40 mg total) by mouth daily. 04/29/21  Yes Sueanne Margarita, MD  Catheters (FOLEY CATHETER 2-WAY) MISC Use as directed by urologist. 12/30/12   Hilton Sinclair, MD     Critical care time: 98 min   Holley Bouche, [facility]  [facility] PGY-2, Resident 09/05/2021, 8:31 AM

## 2021-09-05 NOTE — TOC Initial Note (Signed)
Transition of Care [facility]) - Initial/Assessment Note    Patient Details  Name: Anthony [name] MRN: [id number] Date of Birth: November 28, 1970  Transition of [facility]) CM/SW Contact:    Tom-Johnson, Hershal Coria, RN Phone Number: 09/05/2021, 2:12 PM  Clinical Narrative:                  CM spoke with patient about needs for post hospital transition. Admitted for Sepsis, positive for VRE, on IV abx.  From home alone. Has Hx of Spina Bifida with partial Paraplegia, Hypospadia/Urethro-Cutaneous fistula. Has a chronic Suprapubic Catheter. States his mother checks up on him and they go out to lunch sometimes. Uses public transportation, requesting [employer]. [employer] [place] Application sent out. Patient states he receives too much money from his disability and is not eligible for Medicaid.  SNF recommended by PT/OT, patient declines, states he has home health RN from [facility] but not satisfied with their care, requesting another home health agency. Patient chose [facility] from Medicare.gov list. Candise Bowens voiced acceptance, info on AVS.  Patient has a wheelchair and requesting for a new one. States his wheelchair is very old. Order called in to [facility] and [facility] to deliver at bedside.  CM will continue to follow with needs as patient progresses with care.    Barriers to Discharge: Continued Medical Work up   Patient Goals and CMS Choice Patient states their goals for this hospitalization and ongoing recovery are:: To rerurn home CMS Medicare.gov Compare Post Acute Care list provided to:: Patient Choice offered to / list presented to : Patient  Expected Discharge Plan and Services     Discharge Planning Services: CM Consult Post Acute Care Choice: Home Health Living arrangements for the past 2 months: Apartment                 DME Arranged: Wheelchair manual DME Agency: [employer] Date DME Agency Contacted: 09/05/21 Time DME Agency Contacted: 1410 Representative spoke with at DME Agency: [facility]  Arranged: PT, RN HH Agency: [facility] Date HH Agency Contacted: 09/05/21 Time HH Agency Contacted: 1300 Representative spoke with at [facility] Agency: Candise Bowens  Prior Living Arrangements/Services Living arrangements for the past 2 months: Apartment Lives with:: Self Patient language and need for interpreter reviewed:: Yes Do you feel safe going back to the place where you live?: Yes      Need for Family Participation in Patient Care: Yes (Comment) Care giver support system in place?: Yes (comment) Current home services: DME Furniture conservator/restorer) Criminal Activity/Legal Involvement Pertinent to Current Situation/Hospitalization: No - Comment as needed  Activities of Daily Living Home Assistive Devices/Equipment: Wheelchair ADL Screening (condition at time of admission) Patient's cognitive ability adequate to safely complete daily activities?: Yes Is the patient deaf or have difficulty hearing?: No Does the patient have difficulty seeing, even when wearing glasses/contacts?: No Does the patient have difficulty concentrating, remembering, or making decisions?: No Patient able to express need for assistance with ADLs?: Yes Does the patient have difficulty dressing or bathing?: No Independently performs ADLs?: Yes (appropriate for developmental age) Does the patient have difficulty walking or climbing stairs?: Yes Weakness of Legs: Both Weakness of Arms/Hands: None  Permission Sought/Granted Permission sought to share information with : Case Manager, Magazine features editor, Family Supports Permission granted to share information with : Yes, Verbal Permission Granted              Emotional Assessment Appearance:: Appears stated age Attitude/Demeanor/Rapport: Engaged, Gracious Affect (typically observed): Appropriate, Calm, Hopeful Orientation: :  Oriented to Self, Oriented to Place, Oriented to  Time, Oriented to Situation Alcohol / Substance Use: Not Applicable Psych Involvement: No  (comment)  Admission diagnosis:  Hypokalemia [E87.6] Severe sepsis (HCC) [A41.9, R65.20] Osteomyelitis, unspecified site, unspecified type [facility]) [M86.9] Patient Active Problem List   Diagnosis Date Noted   Osteomyelitis (HCC)    Pyogenic arthritis of left hip (HCC)    Septic shock (HCC) 09/03/2021   AKI (acute kidney injury) (HCC) 09/03/2021   Prolonged Q-T interval on ECG 09/03/2021   Hypokalemia 09/03/2021   Pressure injury of skin of left buttock 05/13/2021   PVCs (premature ventricular contractions)    Atypical chest pain 01/04/2020   Slow transit constipation 03/06/2017   Neurogenic bladder 03/06/2017   Suprapubic catheter (HCC) 03/06/2017   Paraplegia (HCC) 01/29/2017   Pressure injury of skin 01/27/2017   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/24/2017 01/24/2017   Status post debridement 01/24/2017   Hyperlipidemia 03/11/2014   Spina bifida aperta of lumbar spine (HCC) 09/30/2012   Routine adult health maintenance 03/26/2012   Essential hypertension 06/01/2007   ALLERGIC RHINITIS 06/01/2007   ANXIETY 03/19/2006   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   VENOUS INSUFFICIENCY, CHRONIC 03/19/2006   Pressure ulcer 03/19/2006   PCP:  Erick Alley, DO Pharmacy:   [id number] - [place] ([place]), [place] - [address]  ([place]) [place] [address] Phone: [phone] Fax: [phone]  [id number] [place], [place] - [address] AT [facility] OF [address] [place] [address] Phone: [phone] Fax: [phone]     Social Determinants of Health (SDOH) Interventions    Readmission Risk Interventions     No data to display

## 2021-09-05 NOTE — Progress Notes (Signed)
[facility]  Date of Admission:  09/03/2021     Abx: 8/16-c dapto 8/16-c ceftriaxone      8/16-c metronidazole                                                     Assessment: 51 yo spina bifida with partial paraplegia, hypospadia/urethro-cutaneous fistula, chronic suprapubic catheter, bilateral hip dysplasia, hx ischiopubic abscess 2019 s/p debridement, chronic draining left hip sinus tract, admitted 8/16 with septic shock in setting several days increased left  hip discharge   He appears rather well for being in shock   Ct pelvis showed air tracking from left lateral hip skin area into joint. He is insensate at that level. There is no surrounding skin erythema. He has the urethro-cutaneous fistula which on ct doesn't appear to intersect with the left hip process   Blood cx ngtd Ucx e faecalis which I doubt is the cause of his septic shock   His spc was last changed 2 weeks prior to this admission. He has minimal sx there and I am not sure he can tell/distinguish uti   ------------------- 8/17 assessment Off pressors this morning Clinically continues to appear well Vre in urine likely assymptomatic bacteriuria --- bcx remains negative. Right foot lateral decubitus chronic wound doesn't appear infected. Suspect acute on chronic left septic hip process driving current clinical presentation  No surgical intervention planned per ir/ortho for left hip process  Would continue current iv abx here. Will plan 10-14 days of abx and ok to transition to oral abx on discharge. If recurrent sepsis off abx and no other source found, the left hip joint will have to be surgically managed     Plan: Continue dapto/ceftriaxone/flagyl If continued improvement, on discharge could change to doxy/augmentin to finish total 10-14 day course Discussed with primary team   I spent more than 35 minute reviewing data/chart, and coordinating care and >50% direct face to  face time providing counseling/discussing diagnostics/treatment plan with patient    Principal Problem:   Severe sepsis (HCC) Active Problems:   Essential hypertension   Pressure ulcer   AKI (acute kidney injury) (HCC)   Prolonged Q-T interval on ECG   Hypokalemia   Osteomyelitis (HCC)   Pyogenic arthritis of left hip (HCC)   Allergies  Allergen Reactions   Vancomycin Itching    Scheduled Meds:  Chlorhexidine Gluconate Cloth  6 each Topical Daily   enoxaparin (LOVENOX) injection  40 mg Subcutaneous Daily   flecainide  50 mg Oral BID   rosuvastatin  40 mg Oral Daily   sodium hypochlorite   Irrigation BID   Continuous Infusions:  sodium chloride 20 mL/hr at 09/05/21 0600   cefTRIAXone (ROCEPHIN)  IV Stopped (09/05/21 0145)   DAPTOmycin (CUBICIN) 700 mg in sodium chloride 0.9 % IVPB     metronidazole 500 mg (09/05/21 0927)   norepinephrine (LEVOPHED) Adult infusion 1 mcg/min (09/05/21 0600)   potassium PHOSPHATE IVPB (in mmol) 30 mmol (09/05/21 1124)   PRN Meds:.acetaminophen, famotidine, mouth rinse, prochlorperazine   SUBJECTIVE: Off pressor this morning Doing well Bcx negative Ucx vre No ortho/ir intervention planned for left hip  Stable chronic right foot decub changes  Review of Systems: ROS All other ROS was negative, except mentioned above  OBJECTIVE: Vitals:   09/05/21 0845 09/05/21 0900 09/05/21 0915 09/05/21 0930  BP: 99/64 97/64 99/63  107/65  Pulse: 85 88 84 90  Resp: (!) 27 (!) 24 (!) 23 (!) 27  Temp:      TempSrc:      SpO2: 95% 96% 94% 95%  Weight:       Body mass index is 28.89 kg/m.  Physical Exam  General/constitutional: no distress, pleasant HEENT: Normocephalic, PER, Conj Clear, EOMI, Oropharynx clear Neck supple CV: rrr no mrg Lungs: clear to auscultation, normal respiratory effort Abd: Soft, Nontender Ext: no edema Skin: chronic right foot decub without purulence; stable left hip sinus tract serous oozing and small  decub sacral area without frank cellulitis/fluctuance Neuro/msk; stable partial paraplegia  Lab Results Lab Results  Component Value Date   WBC 10.8 (H) 09/05/2021   HGB 8.8 (L) 09/05/2021   HCT 27.9 (L) 09/05/2021   MCV 82.3 09/05/2021   PLT 229 09/05/2021    Lab Results  Component Value Date   CREATININE 0.85 09/05/2021   BUN 16 09/05/2021   NA 140 09/05/2021   K 3.3 (L) 09/05/2021   CL 108 09/05/2021   CO2 25 09/05/2021    Lab Results  Component Value Date   ALT 55 (H) 09/04/2021   AST 70 (H) 09/04/2021   ALKPHOS 83 09/04/2021   BILITOT 0.3 09/04/2021      Microbiology: Recent Results (from the past 240 hour(s))  Resp Panel by RT-PCR (Flu A&B, Covid) Anterior Nasal Swab     Status: None   Collection Time: 09/03/21  3:36 PM   Specimen: Anterior Nasal Swab  Result Value Ref Range Status   SARS Coronavirus 2 by RT PCR NEGATIVE NEGATIVE Final    Comment: (NOTE) SARS-CoV-2 target nucleic acids are NOT DETECTED.  The SARS-CoV-2 RNA is generally detectable in upper respiratory specimens during the acute phase of infection. The lowest concentration of SARS-CoV-2 viral copies this assay can detect is 138 copies/mL. A negative result does not preclude SARS-Cov-2 infection and should not be used as the sole basis for treatment or other patient management decisions. A negative result may occur with  improper specimen collection/handling, submission of specimen other than nasopharyngeal swab, presence of viral mutation(s) within the areas targeted by this assay, and inadequate number of viral copies(<138 copies/mL). A negative result must be combined with clinical observations, patient history, and epidemiological information. The expected result is Negative.  Fact Sheet for Patients:  BloggerCourse.com  Fact Sheet for Healthcare Providers:  SeriousBroker.it  This test is no t yet approved or cleared by the [place] FDA and  has been authorized for detection and/or diagnosis of SARS-CoV-2 by FDA under an Emergency Use Authorization (EUA). This EUA will remain  in effect (meaning this test can be used) for the duration of the COVID-19 declaration under Section 564(b)(1) of the Act, 21 U.S.C.section 360bbb-3(b)(1), unless the authorization is terminated  or revoked sooner.       Influenza A by PCR NEGATIVE NEGATIVE Final   Influenza B by PCR NEGATIVE NEGATIVE Final    Comment: (NOTE) The Xpert Xpress SARS-CoV-2/FLU/RSV plus assay is intended as an aid in the diagnosis of influenza from Nasopharyngeal swab specimens and should not be used as a sole basis for treatment. Nasal washings and aspirates are unacceptable for Xpert Xpress SARS-CoV-2/FLU/RSV testing.  Fact Sheet for Patients: BloggerCourse.com  Fact Sheet for Healthcare Providers: SeriousBroker.it  This test is not yet approved or cleared by the [place] FDA and has been authorized for detection and/or diagnosis of SARS-CoV-2 by FDA under an Emergency Use Authorization (EUA). This EUA will remain in effect (meaning this test can be used) for the duration of the COVID-19 declaration under Section 564(b)(1) of the Act, 21 U.S.C. section 360bbb-3(b)(1), unless the authorization is terminated or revoked.  Performed at [facility], [address] [place], [place] [address]   Urine Culture     Status: Abnormal   Collection Time: 09/03/21  3:36 PM   Specimen: In/Out Cath Urine  Result Value Ref Range Status   Specimen Description IN/OUT CATH URINE  Final   Special Requests   Final    NONE Performed at [facility], [address] [place], [place] [address]    Culture (A)  Final    >=100,000 COLONIES/mL VANCOMYCIN RESISTANT ENTEROCOCCUS   Report Status 09/05/2021 FINAL  Final   Organism ID, Bacteria VANCOMYCIN RESISTANT ENTEROCOCCUS (A)  Final       Susceptibility   Vancomycin resistant enterococcus - MIC*    AMPICILLIN <=2 SENSITIVE Sensitive     NITROFURANTOIN <=16 SENSITIVE Sensitive     VANCOMYCIN >=32 RESISTANT Resistant     * >=100,000 COLONIES/mL VANCOMYCIN RESISTANT ENTEROCOCCUS  Blood Culture (routine x 2)     Status: None (Preliminary result)   Collection Time: 09/03/21  4:18 PM   Specimen: BLOOD  Result Value Ref Range Status   Specimen Description BLOOD SITE NOT SPECIFIED  Final   Special Requests   Final    BOTTLES DRAWN AEROBIC AND ANAEROBIC Blood Culture adequate volume   Culture   Final    NO GROWTH 2 DAYS Performed at [facility], [address], [place], [place] [address]    Report Status PENDING  Incomplete  MRSA Next Gen by PCR, Nasal     Status: None   Collection Time: 09/05/21  1:20 AM   Specimen: Nasal Mucosa; Nasal Swab  Result Value Ref Range Status   MRSA by PCR Next Gen NOT DETECTED NOT DETECTED Final    Comment: (NOTE) The GeneXpert MRSA Assay (FDA approved for NASAL specimens only), is one component of a comprehensive MRSA colonization surveillance program. It is not intended to diagnose MRSA infection nor to guide or monitor treatment for MRSA infections. Test performance is not FDA approved in patients less than 8 years old. Performed at [facility], [address], [place], [place] [address]      Serology:   Imaging: If present, new imagings (plain films, ct scans, and mri) have been personally visualized and interpreted; radiology reports have been reviewed. Decision making incorporated into the Impression / Recommendations.   8/15 abd pelv ct with contrast Multiple large bladder stones are noted. Urinary bladder is decompressed secondary to Foley catheter.   Continued presence of large decubitus ulcer seen involving the left buttocks which is not significantly changed compared to prior exam. It is seen to extend to the base of the penis.   Stable chronic changes  are seen involving both hip joints consistent with hip dysplasia and heterotopic bone formation. However, there is now noted air within the left hip joint concerning for possible septic arthritis.  Raymondo Band, MD [facility] [phone] pager    09/05/2021, 1:17 PM

## 2021-09-05 NOTE — Progress Notes (Signed)
    Durable Medical Equipment  (From admission, onward)           Start     Ordered   09/05/21 1520  For home use only DME Tub bench  Once        09/05/21 1519   09/05/21 1519  For home use only DME lightweight manual wheelchair with seat cushion  Once       Comments: Patient suffers from Partial Paraplegia which impairs their ability to perform daily activities like bathing, dressing, grooming, and toileting in the home.  A walker will not resolve  issue with performing activities of daily living. A wheelchair will allow patient to safely perform daily activities. Patient is not able to propel themselves in the home using a standard weight wheelchair due to general weakness. Patient can self propel in the lightweight wheelchair. Length of need 12 months . Accessories: elevating leg rests (ELRs), wheel locks, extensions and anti-tippers.   09/05/21 1519

## 2021-09-05 NOTE — Hospital Course (Addendum)
51 year old male with paraplegia secondary to spina bifida admitted for septic shock secondary to L hip septic osteomyelitis, now s/p debridement x3, stay also complicated by mitral valve endocarditis with E. faecalis. PMHx includes hypospadia/urethro-cutaneous fistula, neurogenic bladder with chronic suprapubic catheter, bilateral hip dysplasia, hx ischiopubic abscess 2019 s/p debridement, chronic draining left hip sinus tract. His hospital course is outlined below:   Left hip acetabulum osteomyelitis with shock Presented to the ED with intermittent fevers to 102F and chills for the past 1.5 weeks and worsening malodorous drainage with blood from his left sacral decubitus ulcer. Admitted to ICU for hypotension and septic shock and started on low-dose peripheral vasopressors. Transferred to FMTS service for continued management.  MRI of the hip showed soft tissue wound overlying the left ischial tuberosity with severe cellulitis, 6.8 x 4.3 x 8.3 cm abscess, second 2.6 x 8.4 x 2.8 cm abscess, mild marrow edema in the posterior left acetabulum concerning for osteomyelitis. BoneCx grew E. Faecalis and Bacteroides. Admission Bcx grew S. Anginosus. Orthopedic surgery was consulted and recommended Girdlestone amputation of the proximal femur and debridement of the large abscesses - he is now s/p debridement x3 with no further plans for repeat of the procedure and source control achieved. Repeat Bcx negative. Seen by PT/OT who recommended SNF placement. Still on antibiotics for MV endocarditis s/p PICC line (see below)  Mitral Valve Endocarditis [name] was performed and saw a vegetation on the mitral valve leaflet. Difficult to discern if organism is E. faecalis vs. S. Anginosus but thought to be due to E. Faecalis given it is more typical for endocarditis. He was started on antibiotics to treat for endocarditis with synergy. Per ID,  PICC line placed 8/31 for IV antibiotics and will be discharged on:  Ampicillin  12g/day as a continuous infusion Rocephin 2g IV every 12 hours  Metronidazole 500 mg po every 12 hours   Duration: 6 weeks End Date: 10/30/21  Pressure Ulcer Chronic, stage IV pressure ulcer on left buttock, stage III and stage II ulcers present in sacral region.  Additional wound on dorsum of right foot noted. Seen by wound care on admission, but nursing performed wound care throughout stay.  Prolonged QT Interval of ECG On flecainide for PVCs per cardiology. Held flecainide on admission and restarted due to PVCs. Monitored on tele and repeated EKGs during admission. Was medically stable at discharge.   Anemia following surgery Hemoglobin dipped to nadir of 6.7, given PRBC transfusions. Goal throughout stay was > 7. At discharge, Hb was 7.8.

## 2021-09-06 ENCOUNTER — Inpatient Hospital Stay (HOSPITAL_COMMUNITY): Payer: Medicare Other

## 2021-09-06 DIAGNOSIS — M009 Pyogenic arthritis, unspecified: Secondary | ICD-10-CM | POA: Diagnosis not present

## 2021-09-06 DIAGNOSIS — G8220 Paraplegia, unspecified: Secondary | ICD-10-CM | POA: Diagnosis not present

## 2021-09-06 DIAGNOSIS — I10 Essential (primary) hypertension: Secondary | ICD-10-CM | POA: Diagnosis not present

## 2021-09-06 DIAGNOSIS — R748 Abnormal levels of other serum enzymes: Secondary | ICD-10-CM

## 2021-09-06 DIAGNOSIS — I959 Hypotension, unspecified: Secondary | ICD-10-CM

## 2021-09-06 DIAGNOSIS — N179 Acute kidney failure, unspecified: Secondary | ICD-10-CM | POA: Diagnosis not present

## 2021-09-06 DIAGNOSIS — R7881 Bacteremia: Secondary | ICD-10-CM

## 2021-09-06 DIAGNOSIS — L89224 Pressure ulcer of left hip, stage 4: Secondary | ICD-10-CM | POA: Diagnosis not present

## 2021-09-06 DIAGNOSIS — K7200 Acute and subacute hepatic failure without coma: Secondary | ICD-10-CM | POA: Diagnosis not present

## 2021-09-06 DIAGNOSIS — A4181 Sepsis due to Enterococcus: Secondary | ICD-10-CM | POA: Diagnosis not present

## 2021-09-06 DIAGNOSIS — I330 Acute and subacute infective endocarditis: Secondary | ICD-10-CM | POA: Diagnosis not present

## 2021-09-06 DIAGNOSIS — A419 Sepsis, unspecified organism: Secondary | ICD-10-CM | POA: Diagnosis not present

## 2021-09-06 DIAGNOSIS — R6520 Severe sepsis without septic shock: Secondary | ICD-10-CM | POA: Diagnosis not present

## 2021-09-06 DIAGNOSIS — L89522 Pressure ulcer of left ankle, stage 2: Secondary | ICD-10-CM | POA: Diagnosis not present

## 2021-09-06 DIAGNOSIS — R6521 Severe sepsis with septic shock: Secondary | ICD-10-CM | POA: Diagnosis not present

## 2021-09-06 DIAGNOSIS — Z20822 Contact with and (suspected) exposure to covid-19: Secondary | ICD-10-CM | POA: Diagnosis not present

## 2021-09-06 DIAGNOSIS — L89893 Pressure ulcer of other site, stage 3: Secondary | ICD-10-CM | POA: Diagnosis not present

## 2021-09-06 DIAGNOSIS — M869 Osteomyelitis, unspecified: Secondary | ICD-10-CM | POA: Diagnosis not present

## 2021-09-06 DIAGNOSIS — L89154 Pressure ulcer of sacral region, stage 4: Secondary | ICD-10-CM | POA: Diagnosis not present

## 2021-09-06 DIAGNOSIS — E876 Hypokalemia: Secondary | ICD-10-CM | POA: Diagnosis not present

## 2021-09-06 LAB — BLOOD CULTURE ID PANEL (REFLEXED) - BCID2

## 2021-09-06 LAB — ECHOCARDIOGRAM COMPLETE
AR max vel: 3 cm2
AV Peak grad: 7.8 mmHg
Ao pk vel: 1.4 m/s
Area-P 1/2: 6.71 cm2
S' Lateral: 3.7 cm
Weight: 3040 oz

## 2021-09-06 LAB — COMPREHENSIVE METABOLIC PANEL
ALT: 52 U/L — ABNORMAL HIGH (ref 0–44)
AST: 49 U/L — ABNORMAL HIGH (ref 15–41)
Albumin: <1.5 g/dL — ABNORMAL LOW (ref 3.5–5.0)
Alkaline Phosphatase: 246 U/L — ABNORMAL HIGH (ref 38–126)
Anion gap: 8 (ref 5–15)
BUN: 13 mg/dL (ref 6–20)
CO2: 23 mmol/L (ref 22–32)
Calcium: 7.5 mg/dL — ABNORMAL LOW (ref 8.9–10.3)
Chloride: 108 mmol/L (ref 98–111)
Creatinine, Ser: 0.84 mg/dL (ref 0.61–1.24)
GFR, Estimated: >60 mL/min (ref >60)
Glucose, Bld: 125 mg/dL — ABNORMAL HIGH (ref 70–99)
Potassium: 3.8 mmol/L (ref 3.5–5.1)
Sodium: 139 mmol/L (ref 135–145)
Total Bilirubin: 0.6 mg/dL (ref 0.3–1.2)
Total Protein: 5.4 g/dL — ABNORMAL LOW (ref 6.5–8.1)

## 2021-09-06 LAB — PHOSPHORUS: Phosphorus: 2.2 mg/dL — ABNORMAL LOW (ref 2.5–4.6)

## 2021-09-06 LAB — PROCALCITONIN: Procalcitonin: 27.15 ng/mL

## 2021-09-06 LAB — CK: Total CK: 19 U/L — ABNORMAL LOW (ref 49–397)

## 2021-09-06 LAB — GLUCOSE, CAPILLARY: Glucose-Capillary: 105 mg/dL — ABNORMAL HIGH (ref 70–99)

## 2021-09-06 MED ORDER — POTASSIUM CHLORIDE CRYS ER 20 MEQ PO TBCR
40.0000 meq | EXTENDED_RELEASE_TABLET | Freq: Once | ORAL | Status: AC
  Administered 2021-09-06: 40 meq via ORAL
  Filled 2021-09-06: qty 2

## 2021-09-06 MED ORDER — K PHOS MONO-SOD PHOS DI & MONO 155-852-130 MG PO TABS
500.0000 mg | ORAL_TABLET | Freq: Four times a day (QID) | ORAL | Status: AC
  Administered 2021-09-06 (×2): 500 mg via ORAL
  Filled 2021-09-06 (×2): qty 2

## 2021-09-06 MED ORDER — SODIUM CHLORIDE 0.9 % IV SOLN: INTRAVENOUS | Status: DC

## 2021-09-06 MED ORDER — METRONIDAZOLE 500 MG PO TABS
500.0000 mg | ORAL_TABLET | Freq: Two times a day (BID) | ORAL | Status: DC
  Administered 2021-09-06 – 2021-09-10 (×8): 500 mg via ORAL
  Filled 2021-09-06 (×8): qty 1

## 2021-09-06 NOTE — Progress Notes (Addendum)
Patient transported with the assistants of SWOT RN to [address] [place]. Patient sent with cell phone and charger  and charging cord in hand to nursing unit . Cell phone charger and wound care products to include Dakins solution. Mother didn't answer phone, voicemail left to make her aware of tranfer and new room information.

## 2021-09-06 NOTE — Assessment & Plan Note (Signed)
Elevated on admission, AST 74, ALT 68. Now AST 49, ALT 52, alk phos 246. Likely in the setting of sepsis. Resolving - continue to monitor LFTs

## 2021-09-06 NOTE — Progress Notes (Signed)
[facility]  Date of Admission:  09/03/2021     Abx: 8/16-c dapto 8/16-c ceftriaxone      8/16-c metronidazole                                                     Assessment: 51 yo spina bifida with partial paraplegia, hypospadia/urethro-cutaneous fistula, chronic suprapubic catheter, bilateral hip dysplasia, hx ischiopubic abscess 2019 s/p debridement, chronic draining left hip sinus tract, admitted 8/16 with septic shock in setting several days increased left  hip discharge   He appears rather well for being in shock   Ct pelvis showed air tracking from left lateral hip skin area into joint. He is insensate at that level. There is no surrounding skin erythema. He has the urethro-cutaneous fistula which on ct doesn't appear to intersect with the left hip process   Blood cx ngtd Ucx e faecalis which I doubt is the cause of his septic shock   His spc was last changed 2 weeks prior to this admission. He has minimal sx there and I am not sure he can tell/distinguish uti   ------------------- 8/18 assessment Remains off pressors and will be transferred out of ICU today Vre in urine likely assymptomatic bacteriuria  Bcid of admission bcx show strep species -- I don't know what to make of this yet (only 1 set was drawn), and by itself, outside of the immunosuppressed population, viridan strep is unlikely to cause this severe septic presentation by itself  Having said that the source for severe sepsis/septic shock in consideration still the left septic hip process. The streptococcal species might be secondary bacteremia. He does have high handoc score and potentially will need to r/o endocarditis   No surgical intervention planned per ir/ortho for left hip process       Plan: Continue dapto/ceftriaxone/flagyl Tte Repeat bcx Will need tee as well Discussed with primary team    I spent more than 35 minute reviewing data/chart, and  coordinating care and >50% direct face to face time providing counseling/discussing diagnostics/treatment plan with patient     Principal Problem:   Sepsis (HCC) Active Problems:   Essential hypertension   Pressure ulcer   AKI (acute kidney injury) (HCC)   Prolonged Q-T interval on ECG   Hypokalemia   Osteomyelitis (HCC)   Pyogenic arthritis of left hip (HCC)   Elevated liver enzymes   Allergies  Allergen Reactions   Vancomycin Itching    Scheduled Meds:  Chlorhexidine Gluconate Cloth  6 each Topical Daily   enoxaparin (LOVENOX) injection  40 mg Subcutaneous Daily   flecainide  50 mg Oral BID   metroNIDAZOLE  500 mg Oral Q12H   phosphorus  500 mg Oral Q6H   potassium chloride  40 mEq Oral Once   rosuvastatin  40 mg Oral Daily   sodium hypochlorite   Irrigation BID   Continuous Infusions:  sodium chloride 10 mL/hr at 09/06/21 0500   cefTRIAXone (ROCEPHIN)  IV Stopped (09/06/21 0157)   DAPTOmycin (CUBICIN) 700 mg in sodium chloride 0.9 % IVPB Stopped (09/05/21 2015)   PRN Meds:.acetaminophen, famotidine, lip balm, mouth rinse, prochlorperazine   SUBJECTIVE: Being transitioned out of icu No complaint  Bcx bcid strep species    Review of Systems: ROS All other ROS was  negative, except mentioned above     OBJECTIVE: Vitals:   09/06/21 0500 09/06/21 0600 09/06/21 0700 09/06/21 1130  BP: (!) 78/50 92/73 (!) 87/65   Pulse: 86 92 95   Resp: 12 (!) 30 19   Temp:    98.7 F (37.1 C)  TempSrc:    Oral  SpO2: 92% 97% 91%   Weight:       Body mass index is 28.89 kg/m.  Physical Exam General/constitutional: no distress, pleasant HEENT: Normocephalic, PER, Conj Clear CV: rrr no mrg Lungs; normal respiratory effort Abd: Soft, Nontender Ext: no edema Neuro: nonfocal; stable chronic paraplegia   Lab Results Lab Results  Component Value Date   WBC 10.8 (H) 09/05/2021   HGB 8.8 (L) 09/05/2021   HCT 27.9 (L) 09/05/2021   MCV 82.3 09/05/2021   PLT 229  09/05/2021    Lab Results  Component Value Date   CREATININE 0.84 09/06/2021   BUN 13 09/06/2021   NA 139 09/06/2021   K 3.8 09/06/2021   CL 108 09/06/2021   CO2 23 09/06/2021    Lab Results  Component Value Date   ALT 52 (H) 09/06/2021   AST 49 (H) 09/06/2021   ALKPHOS 246 (H) 09/06/2021   BILITOT 0.6 09/06/2021      Microbiology: Recent Results (from the past 240 hour(s))  Resp Panel by RT-PCR (Flu A&B, Covid) Anterior Nasal Swab     Status: None   Collection Time: 09/03/21  3:36 PM   Specimen: Anterior Nasal Swab  Result Value Ref Range Status   SARS Coronavirus 2 by RT PCR NEGATIVE NEGATIVE Final    Comment: (NOTE) SARS-CoV-2 target nucleic acids are NOT DETECTED.  The SARS-CoV-2 RNA is generally detectable in upper respiratory specimens during the acute phase of infection. The lowest concentration of SARS-CoV-2 viral copies this assay can detect is 138 copies/mL. A negative result does not preclude SARS-Cov-2 infection and should not be used as the sole basis for treatment or other patient management decisions. A negative result may occur with  improper specimen collection/handling, submission of specimen other than nasopharyngeal swab, presence of viral mutation(s) within the areas targeted by this assay, and inadequate number of viral copies(<138 copies/mL). A negative result must be combined with clinical observations, patient history, and epidemiological information. The expected result is Negative.  Fact Sheet for Patients:  BloggerCourse.com  Fact Sheet for Healthcare Providers:  SeriousBroker.it  This test is no t yet approved or cleared by the [place] FDA and  has been authorized for detection and/or diagnosis of SARS-CoV-2 by FDA under an Emergency Use Authorization (EUA). This EUA will remain  in effect (meaning this test can be used) for the duration of the COVID-19 declaration under Section  564(b)(1) of the Act, 21 U.S.C.section 360bbb-3(b)(1), unless the authorization is terminated  or revoked sooner.       Influenza A by PCR NEGATIVE NEGATIVE Final   Influenza B by PCR NEGATIVE NEGATIVE Final    Comment: (NOTE) The Xpert Xpress SARS-CoV-2/FLU/RSV plus assay is intended as an aid in the diagnosis of influenza from Nasopharyngeal swab specimens and should not be used as a sole basis for treatment. Nasal washings and aspirates are unacceptable for Xpert Xpress SARS-CoV-2/FLU/RSV testing.  Fact Sheet for Patients: BloggerCourse.com  Fact Sheet for Healthcare Providers: SeriousBroker.it  This test is not yet approved or cleared by the [place] FDA and has been authorized for detection and/or diagnosis of SARS-CoV-2 by FDA under an Emergency Use Authorization (  EUA). This EUA will remain in effect (meaning this test can be used) for the duration of the COVID-19 declaration under Section 564(b)(1) of the Act, 21 U.S.C. section 360bbb-3(b)(1), unless the authorization is terminated or revoked.  Performed at [facility], [address] [place] [address], [place], [place] [address]   Urine Culture     Status: Abnormal   Collection Time: 09/03/21  3:36 PM   Specimen: In/Out Cath Urine  Result Value Ref Range Status   Specimen Description IN/OUT CATH URINE  Final   Special Requests   Final    NONE Performed at [facility], [address] [place], [place] [address]    Culture (A)  Final    >=100,000 COLONIES/mL VANCOMYCIN RESISTANT ENTEROCOCCUS   Report Status 09/05/2021 FINAL  Final   Organism ID, Bacteria VANCOMYCIN RESISTANT ENTEROCOCCUS (A)  Final      Susceptibility   Vancomycin resistant enterococcus - MIC*    AMPICILLIN <=2 SENSITIVE Sensitive     NITROFURANTOIN <=16 SENSITIVE Sensitive     VANCOMYCIN >=32 RESISTANT Resistant     * >=100,000 COLONIES/mL VANCOMYCIN RESISTANT ENTEROCOCCUS  Blood Culture  (routine x 2)     Status: None (Preliminary result)   Collection Time: 09/03/21  4:18 PM   Specimen: BLOOD  Result Value Ref Range Status   Specimen Description BLOOD SITE NOT SPECIFIED  Final   Special Requests   Final    BOTTLES DRAWN AEROBIC AND ANAEROBIC Blood Culture adequate volume   Culture  Setup Time   Final    GRAM POSITIVE COCCI IN CHAINS ANAEROBIC BOTTLE ONLY CRITICAL RESULT CALLED TO, READ BACK BY AND VERIFIED WITH: SYDNEY DAVIS [id number] AT 1138 BY EC Performed at [facility], [address], [place], [place] [address]    Culture GRAM POSITIVE COCCI  Final   Report Status PENDING  Incomplete  Blood Culture ID Panel (Reflexed)     Status: Abnormal   Collection Time: 09/03/21  4:18 PM  Result Value Ref Range Status   Enterococcus faecalis NOT DETECTED NOT DETECTED Final   Enterococcus Faecium NOT DETECTED NOT DETECTED Final   Listeria monocytogenes NOT DETECTED NOT DETECTED Final   Staphylococcus species NOT DETECTED NOT DETECTED Final   Staphylococcus aureus (BCID) NOT DETECTED NOT DETECTED Final   Staphylococcus epidermidis NOT DETECTED NOT DETECTED Final   Staphylococcus lugdunensis NOT DETECTED NOT DETECTED Final   Streptococcus species DETECTED (A) NOT DETECTED Final    Comment: Not Enterococcus species, Streptococcus agalactiae, Streptococcus pyogenes, or Streptococcus pneumoniae. CRITICAL RESULT CALLED TO, READ BACK BY AND VERIFIED WITH: PHARMD SYDNEY DAVIS [id number] AT 1138 BY EC    Streptococcus agalactiae NOT DETECTED NOT DETECTED Final   Streptococcus pneumoniae NOT DETECTED NOT DETECTED Final   Streptococcus pyogenes NOT DETECTED NOT DETECTED Final   A.calcoaceticus-baumannii NOT DETECTED NOT DETECTED Final   Bacteroides fragilis NOT DETECTED NOT DETECTED Final   Enterobacterales NOT DETECTED NOT DETECTED Final   Enterobacter cloacae complex NOT DETECTED NOT DETECTED Final   Escherichia coli NOT DETECTED NOT DETECTED Final   Klebsiella aerogenes NOT  DETECTED NOT DETECTED Final   Klebsiella oxytoca NOT DETECTED NOT DETECTED Final   Klebsiella pneumoniae NOT DETECTED NOT DETECTED Final   Proteus species NOT DETECTED NOT DETECTED Final   Salmonella species NOT DETECTED NOT DETECTED Final   Serratia marcescens NOT DETECTED NOT DETECTED Final   Haemophilus influenzae NOT DETECTED NOT DETECTED Final   Neisseria meningitidis NOT DETECTED NOT DETECTED Final  Pseudomonas aeruginosa NOT DETECTED NOT DETECTED Final   Stenotrophomonas maltophilia NOT DETECTED NOT DETECTED Final   Candida albicans NOT DETECTED NOT DETECTED Final   Candida auris NOT DETECTED NOT DETECTED Final   Candida glabrata NOT DETECTED NOT DETECTED Final   Candida krusei NOT DETECTED NOT DETECTED Final   Candida parapsilosis NOT DETECTED NOT DETECTED Final   Candida tropicalis NOT DETECTED NOT DETECTED Final   Cryptococcus neoformans/gattii NOT DETECTED NOT DETECTED Final    Comment: Performed at [facility], [address], [place], [place] [address]  MRSA Next Gen by PCR, Nasal     Status: None   Collection Time: 09/05/21  1:20 AM   Specimen: Nasal Mucosa; Nasal Swab  Result Value Ref Range Status   MRSA by PCR Next Gen NOT DETECTED NOT DETECTED Final    Comment: (NOTE) The GeneXpert MRSA Assay (FDA approved for NASAL specimens only), is one component of a comprehensive MRSA colonization surveillance program. It is not intended to diagnose MRSA infection nor to guide or monitor treatment for MRSA infections. Test performance is not FDA approved in patients less than 61 years old. Performed at [facility], [address], [place], [place] [address]      Serology:   Imaging: If present, new imagings (plain films, ct scans, and mri) have been personally visualized and interpreted; radiology reports have been reviewed. Decision making incorporated into the Impression / Recommendations.   8/15 abd pelv ct with contrast Multiple large bladder stones  are noted. Urinary bladder is decompressed secondary to Foley catheter.   Continued presence of large decubitus ulcer seen involving the left buttocks which is not significantly changed compared to prior exam. It is seen to extend to the base of the penis.   Stable chronic changes are seen involving both hip joints consistent with hip dysplasia and heterotopic bone formation. However, there is now noted air within the left hip joint concerning for possible septic arthritis.    Raymondo Band, MD [facility] [phone] pager    09/06/2021, 12:19 PM

## 2021-09-06 NOTE — Progress Notes (Signed)
FMTS Brief Progress Note  S: Patient evaluated at bedside with Dr. Anner Crete.  He reports feeling better than at time of his admission.  He has no acute concerns at this time.  He had questions about his current disposition and [name] on Monday, we discussed that he has undergoing further imaging to ensure no vegetations are on his heart.  Patient agreeable to this plan.   O: BP (!) 86/55 (BP Location: Right Arm)   Pulse (!) 101   Temp 98.8 F (37.1 C) (Oral)   Resp (!) 22   Wt 86.2 kg   SpO2 97%   BMI 28.89 kg/m   Cardio: RRR no MRG Pulm: CTAB  A/P: Patient shows improvement as evidenced by his lack of symptomology.  However, he has had intermittent fevers in the last 24 hours despite appropriate antibiotic therapy.  We will continue to monitor his temperature. - If fever returns tonight we will consider ID consult for further Rex  - Orders reviewed. Labs for AM ordered, which was adjusted as needed.  - If condition changes, please page family medicine service.   Tiffany Kocher, MD 09/06/2021, 9:05 PM PGY-1, [facility] Family Medicine Night Resident  Please page [phone] with questions.

## 2021-09-06 NOTE — Progress Notes (Signed)
PHARMACY - PHYSICIAN COMMUNICATION CRITICAL VALUE ALERT - BLOOD CULTURE IDENTIFICATION (BCID)  Anthony [name] is an 51 y.o. male who presented to [facility] on 09/03/2021 with a chief complaint of fever/chills and drainage from a chronic sacral wound. Also has chronic draining from his left hip sinus tract with CT pelvis demonstrating air tracking.   Assessment: 1/2 bottles streptococcus spp. (no organism identified)  Name of physician (or Provider) Contacted: Raymondo Band, MD  Current antibiotics:  8/15 ceftriaxone >> 8/16 daptomycin >> 8/16 metronidazole >>  Changes to prescribed antibiotics recommended:  Patient is on recommended antibiotics - No changes needed. Plan for patient to have [name] next week due to endocarditis risk.   Results for orders placed or performed during the hospital encounter of 09/03/21  Blood Culture ID Panel (Reflexed) (Collected: 09/03/2021  4:18 PM)  Result Value Ref Range   Enterococcus faecalis NOT DETECTED NOT DETECTED   Enterococcus Faecium NOT DETECTED NOT DETECTED   Listeria monocytogenes NOT DETECTED NOT DETECTED   Staphylococcus species NOT DETECTED NOT DETECTED   Staphylococcus aureus (BCID) NOT DETECTED NOT DETECTED   Staphylococcus epidermidis NOT DETECTED NOT DETECTED   Staphylococcus lugdunensis NOT DETECTED NOT DETECTED   Streptococcus species DETECTED (A) NOT DETECTED   Streptococcus agalactiae NOT DETECTED NOT DETECTED   Streptococcus pneumoniae NOT DETECTED NOT DETECTED   Streptococcus pyogenes NOT DETECTED NOT DETECTED   A.calcoaceticus-baumannii NOT DETECTED NOT DETECTED   Bacteroides fragilis NOT DETECTED NOT DETECTED   Enterobacterales NOT DETECTED NOT DETECTED   Enterobacter cloacae complex NOT DETECTED NOT DETECTED   Escherichia coli NOT DETECTED NOT DETECTED   Klebsiella aerogenes NOT DETECTED NOT DETECTED   Klebsiella oxytoca NOT DETECTED NOT DETECTED   Klebsiella pneumoniae NOT DETECTED NOT DETECTED   Proteus species NOT  DETECTED NOT DETECTED   Salmonella species NOT DETECTED NOT DETECTED   Serratia marcescens NOT DETECTED NOT DETECTED   Haemophilus influenzae NOT DETECTED NOT DETECTED   Neisseria meningitidis NOT DETECTED NOT DETECTED   Pseudomonas aeruginosa NOT DETECTED NOT DETECTED   Stenotrophomonas maltophilia NOT DETECTED NOT DETECTED   Candida albicans NOT DETECTED NOT DETECTED   Candida auris NOT DETECTED NOT DETECTED   Candida glabrata NOT DETECTED NOT DETECTED   Candida krusei NOT DETECTED NOT DETECTED   Candida parapsilosis NOT DETECTED NOT DETECTED   Candida tropicalis NOT DETECTED NOT DETECTED   Cryptococcus neoformans/gattii NOT DETECTED NOT DETECTED    Cherylin Mylar, PharmD PGY1 Pharmacy Resident 8/18/20231:10 PM

## 2021-09-06 NOTE — Progress Notes (Addendum)
Daily Progress Note Intern Pager: [phone]  Patient name: Anthony [name] Medical record number: [id number] Date of birth: 11/25/1970 Age: 51 y.o. Gender: male  Primary Care Provider: Erick Alley, DO Consultants: ID, pharmacy, PT/OT,  Code Status: FULL  Pt Overview and Major Events to Date:  - 8/16 admitted for septic shock, transferred to ICU - 8/18 MAPS stable > 65. transferred to progressive  Assessment and Plan: 51 year old male with paraplegia secondary to spina bifida admitted for septic shock most likely in the setting of L hip septic arthritis, chronic sacral wound, transferred to ICU, now with stable MAPS > 65 and transferred to progressive, awaiting BP to baseline and continuing on antibiotic regimen  * Sepsis (HCC) SIRS criteria for severe sepsis met including hypotension and lactic acidosis.  Hypotension improved after 3 L of LR, however pressure dropped again and he received 1 additional liter of LR. Ultimately hypotension was refractory to fluid resuscitation. Patient has multiple wounds (see pressure ulcer below for more details), including stage IV decubitus ulcer on left buttocks is a likely source of infection. Air level in left hip was seen on CT hip/pelvis concerning for septic arthritis, but per orthopedic surgery, no indication to intervene as deemed nonsalvageable for mobility purposes. UA results deemed to be asymptomatic bacteruria. On ceftriaxone, metronidazole, and pharmacy-dosed daptomycin. Per ID, abx regimen to last for 10 - 14 days, may switch to oral doxy/augmentin on discharge - Admitted to FMTS, attending Dr. Manson Passey  - General surgery consult, no intervention as time - Orthopedics consulted, no intervention at this time - continue abx regimen - Scheduled Tylenol and Ibuprofen q6h  - Monitor BP's, goal MAP >65  - Low threshold to re-involve CCM if hemodynamic instability - Continue abx regimen: ceftriaxone, metronidazole, and pharmacy-dosed  daptomycin - consider switching to oral doxy/augmentin on discharge - per ID recs of 10 - 14 days for total abx regimen, end date is 8/24 - 8/28  Pressure ulcer Chronic stage IV pressure ulcer on left buttock, stage III and stage II ulcers present in sacral region.  Additional wound on dorsum of right foot noted. Wound care consulted, topical wound care orders in place. No further needs from WOC at this time, bedside nursing to perform wound care. - Wound care per bedside nursing - See severe sepsis above for more details  Prolonged Q-T interval on ECG QTc of 515 on most recent ECG.  On flecainide, known to cause prolongation - consider holding - Avoid QTc prolonging agents -- check Mg -- f/u EKG  Hypokalemia Potassium of 2.7 at admission. - Repleted with IV potassium (10 mEq x3) and PO potassium (40 mEq) in the ED - Pending magnesium  AKI (acute kidney injury) (HCC) Resolved. Cr. 0.84. Creatinine elevated to 2.34 on admission, baseline of 0.5-0.6 as observed from labs over the last 2 months. Was likely pre-renal due to sepsis. - Avoid nephrotoxic agents  Elevated liver enzymes Elevated on admission, AST 74, ALT 68. Now AST 49, ALT 52, alk phos 246. Likely in the setting of sepsis. Resolving - continue to monitor LFTs  Essential hypertension Hypotensive on admission, related to severe sepsis. Hold home lisinopril given soft pressures   HLD Stable. On rosuvastatin  FEN/GI: famotidine PRN PPx: enoxaparin Dispo:Home pending clinical improvement .   Subjective:  Patient feeling well. Denies chest pain, SOB. Denies n/v/d.  Objective: Temp:  [98.6 F (37 C)-99.4 F (37.4 C)] 98.7 F (37.1 C) (08/18 1130) Pulse Rate:  [84-109] 95 (08/18 0700)  Resp:  [12-35] 19 (08/18 0700) BP: (78-97)/(50-73) 87/65 (08/18 0700) SpO2:  [90 %-97 %] 91 % (08/18 0700) Physical Exam: General: well-appearing and in no acute distress HEENT: normocephalic and atraumatic Cardiovascular: regular  rate Respiratory: CTAB anteriorly, normal respiratory effort, and on RA Gastrointestinal: non-tender and non-distended Neuro: following commands and no focal neurological deficits   Laboratory: Most recent CBC Lab Results  Component Value Date   WBC 10.8 (H) 09/05/2021   HGB 8.8 (L) 09/05/2021   HCT 27.9 (L) 09/05/2021   MCV 82.3 09/05/2021   PLT 229 09/05/2021   Most recent BMP    Latest Ref Rng & Units 09/06/2021    1:17 AM  BMP  Glucose 70 - 99 mg/dL 125   BUN 6 - 20 mg/dL 13   Creatinine 0.61 - 1.24 mg/dL 0.84   Sodium 135 - 145 mmol/L 139   Potassium 3.5 - 5.1 mmol/L 3.8   Chloride 98 - 111 mmol/L 108   CO2 22 - 32 mmol/L 23   Calcium 8.9 - 10.3 mg/dL 7.5    QTc 470 on telemetry  Camelia Phenes, MD 09/06/2021, 12:43 PM  PGY-1, [place] Intern pager: [phone], text pages welcome Secure chat group [place]

## 2021-09-06 NOTE — Progress Notes (Signed)
   [facility] has been requested to perform a transesophageal echocardiogram on Anthony [name] for bacteremia.  After careful review of history and examination, the risks and benefits of transesophageal echocardiogram have been explained including risks of esophageal damage, perforation (1:10,000 risk), bleeding, pharyngeal hematoma as well as other potential complications associated with conscious sedation including aspiration, arrhythmia, respiratory failure and death. Alternatives to treatment were discussed, questions were answered. Patient is willing to proceed.   Patient states he sometimes has trouble swallowing big pills but no other dysphagia. No history of radiation to chest/neck.  Procedure is scheduled for 09/09/2021 at 10:30am with Dr. Mayford Knife. Will place pre-procedural orders.  Corrin Parker, PA-C 09/06/2021 4:44 PM

## 2021-09-06 NOTE — Progress Notes (Signed)
Pharmacy Antibiotic Note  Anthony [name] is a 51 y.o. male admitted on 09/03/2021 presenting with sepsis (fever, hypotension, elevated lactic acid) likely due to chronic decubitus wounds now with drainage. 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 Further has chronic draining let hip sinus tract with CT pelvis demonstrating air tracking.  Pharmacy has been consulted for daptomycin dosing. Noted patient is paraplegic with baseline Scr ~0.6.   Per chart review, weight is ~170-190 lbs. Scr improving rapidly with aggressive fluid resuscitation. Low CK of 19. NO plans for surgery. Patient admitted to the ICU for HoTN necessitating peripheral pressors.   Will empirically continue daptomycin 8 mg/kg actual bodyweight as his urine culture is growing VRE. If VRE were to grow in his bloodstream, patient would require a dose increase to 10-12 mg/kg.   CK 19, Scr 0.84, WBC 10.8 (on 8/17), afebrile  Plan: CONTINUE Daptomycin 700mg  q24h  CONTINUE Ceftriaxone 2g IV Q24H  CONTINUE metronidazole 500 mg IV Q12H  Monitor renal function, QFri CK, cultures, LOT Weight: 86.2 kg (190 lb)  Temp (24hrs), Avg:98.9 F (37.2 C), Min:98.6 F (37 C), Max:99.4 F (37.4 C)  Recent Labs  Lab 09/03/21 1618 09/03/21 1937 09/03/21 2201 09/04/21 0428 09/05/21 0609 09/06/21 0117  WBC 22.0*  --  18.6* 15.5* 10.8*  --   CREATININE 2.34*  --  1.57* 1.17 0.85 0.84  LATICACIDVEN 3.8* 1.7  --   --   --   --      Estimated Creatinine Clearance: 111.1 mL/min (by C-G formula based on SCr of 0.84 mg/dL).    Allergies  Allergen Reactions   Vancomycin Itching   Antimicrobials: Dapto 8/15 >>  MTZ 8/16 >>  CTX 8/15>>  Microbiology  8/15 Ucx: VRE (S-ampicillin)  8/15 Bcx NGTD    9/15, PharmD PGY1 Pharmacy Resident 8/18/20239:21 AM

## 2021-09-06 NOTE — Consult Note (Signed)
WOC consulted 8/18, was seen by [facility] nurse 8/16 during this admission,see consultation notes. Topical wound care orders in the chart for ischium, trochanter, foot wound. Bedside nurse to provide wound care.   Aeron Donaghey [facility], CNS, [employer] [phone]

## 2021-09-07 DIAGNOSIS — E876 Hypokalemia: Secondary | ICD-10-CM | POA: Diagnosis not present

## 2021-09-07 DIAGNOSIS — R6520 Severe sepsis without septic shock: Secondary | ICD-10-CM | POA: Diagnosis not present

## 2021-09-07 DIAGNOSIS — A419 Sepsis, unspecified organism: Secondary | ICD-10-CM | POA: Diagnosis not present

## 2021-09-07 DIAGNOSIS — L89522 Pressure ulcer of left ankle, stage 2: Secondary | ICD-10-CM | POA: Diagnosis not present

## 2021-09-07 DIAGNOSIS — K7200 Acute and subacute hepatic failure without coma: Secondary | ICD-10-CM

## 2021-09-07 DIAGNOSIS — M869 Osteomyelitis, unspecified: Secondary | ICD-10-CM | POA: Diagnosis not present

## 2021-09-07 DIAGNOSIS — R9431 Abnormal electrocardiogram [ECG] [EKG]: Secondary | ICD-10-CM | POA: Diagnosis not present

## 2021-09-07 LAB — COMPREHENSIVE METABOLIC PANEL
ALT: 40 U/L (ref 0–44)
AST: 38 U/L (ref 15–41)
Albumin: <1.5 g/dL — ABNORMAL LOW (ref 3.5–5.0)
Alkaline Phosphatase: 310 U/L — ABNORMAL HIGH (ref 38–126)
Anion gap: 7 (ref 5–15)
BUN: 10 mg/dL (ref 6–20)
CO2: 24 mmol/L (ref 22–32)
Calcium: 7.6 mg/dL — ABNORMAL LOW (ref 8.9–10.3)
Chloride: 109 mmol/L (ref 98–111)
Creatinine, Ser: 0.76 mg/dL (ref 0.61–1.24)
GFR, Estimated: >60 mL/min (ref >60)
Glucose, Bld: 98 mg/dL (ref 70–99)
Potassium: 4.5 mmol/L (ref 3.5–5.1)
Sodium: 140 mmol/L (ref 135–145)
Total Bilirubin: 0.4 mg/dL (ref 0.3–1.2)
Total Protein: 5.5 g/dL — ABNORMAL LOW (ref 6.5–8.1)

## 2021-09-07 LAB — TYPE AND SCREEN
ABO/RH(D): O POS
Antibody Screen: NEGATIVE
Donor AG Type: NEGATIVE
Unit division: 0
Unit division: 0
Unit division: 0
Unit division: 0
Unit division: 0
Unit division: 0
Unit division: 0
Unit division: 0

## 2021-09-07 LAB — BPAM RBC
Blood Product Expiration Date: 202308302359
Blood Product Expiration Date: 202308312359
Blood Product Expiration Date: 202309112359
Blood Product Expiration Date: 202309112359
Blood Product Expiration Date: 202309172359
Blood Product Expiration Date: 202309172359
Blood Product Expiration Date: 202309222359
Blood Product Expiration Date: 202309232359
ISSUE DATE / TIME: 202308141102
ISSUE DATE / TIME: 202308141112
Unit Type and Rh: 5100
Unit Type and Rh: 5100
Unit Type and Rh: 9500
Unit Type and Rh: 9500
Unit Type and Rh: 9500
Unit Type and Rh: 9500
Unit Type and Rh: 9500
Unit Type and Rh: 9500

## 2021-09-07 LAB — MAGNESIUM: Magnesium: 1.8 mg/dL (ref 1.7–2.4)

## 2021-09-07 LAB — CBC WITH DIFFERENTIAL/PLATELET
Abs Immature Granulocytes: 0.17 10*3/uL — ABNORMAL HIGH (ref 0.00–0.07)
Basophils Absolute: 0 10*3/uL (ref 0.0–0.1)
Basophils Relative: 0 %
Eosinophils Absolute: 0.1 10*3/uL (ref 0.0–0.5)
Eosinophils Relative: 1 %
HCT: 29.9 % — ABNORMAL LOW (ref 39.0–52.0)
Hemoglobin: 9.5 g/dL — ABNORMAL LOW (ref 13.0–17.0)
Immature Granulocytes: 2 %
Lymphocytes Relative: 10 %
Lymphs Abs: 1.1 10*3/uL (ref 0.7–4.0)
MCH: 26 pg (ref 26.0–34.0)
MCHC: 31.8 g/dL (ref 30.0–36.0)
MCV: 81.9 fL (ref 80.0–100.0)
Monocytes Absolute: 1.1 10*3/uL — ABNORMAL HIGH (ref 0.1–1.0)
Monocytes Relative: 9 %
Neutro Abs: 9.1 10*3/uL — ABNORMAL HIGH (ref 1.7–7.7)
Neutrophils Relative %: 78 %
Platelets: 279 10*3/uL (ref 150–400)
RBC: 3.65 MIL/uL — ABNORMAL LOW (ref 4.22–5.81)
RDW: 15.4 % (ref 11.5–15.5)
WBC: 11.5 10*3/uL — ABNORMAL HIGH (ref 4.0–10.5)
nRBC: 0 % (ref 0.0–0.2)

## 2021-09-07 MED ORDER — LORATADINE 10 MG PO TABS
10.0000 mg | ORAL_TABLET | Freq: Every day | ORAL | Status: DC | PRN
  Administered 2021-09-07 – 2021-09-23 (×2): 10 mg via ORAL
  Filled 2021-09-07 (×2): qty 1

## 2021-09-07 MED ORDER — MAGNESIUM SULFATE IN D5W 1-5 GM/100ML-% IV SOLN
1.0000 g | Freq: Once | INTRAVENOUS | Status: AC
  Administered 2021-09-07: 1 g via INTRAVENOUS
  Filled 2021-09-07: qty 100

## 2021-09-07 NOTE — Progress Notes (Signed)
Occupational Therapy Treatment Patient Details Name: Anthony [name] MRN: [id number] DOB: 1970/04/23 Today's Date: 09/07/2021   History of present illness Pt is a 51 y/o male admitted secondary to sepsis likely from chronic wounds. PMH includes  spina bifida and paraplegia, neurogenic bladder with suprapubic catheter, complicated by chronic sacral wounds, chronic hip wound.   OT comments  Pt not feeling well, c/o chills and nausea, RN notified. Pt able to roll for pressure relief. Reports he was able to manage wounds between [facility] visits, did not observe pt's ability to access. Pt asking for reacher as the ones he buys at the [facility] break easily. Educated pt in difference between tub seat and tub bench, he chose tub seat, which was delivered to his room. Pt is refusing SNF, but continues to be this therapist's recommendation to help heal wounds.   Recommendations for follow up therapy are one component of a multi-disciplinary discharge planning process, led by the attending physician.  Recommendations may be updated based on patient status, additional functional criteria and insurance authorization.    Follow Up Recommendations  Skilled nursing-short term rehab (<3 hours/day)    Assistance Recommended at Discharge Intermittent Supervision/Assistance  Patient can return home with the following  A little help with walking and/or transfers;A little help with bathing/dressing/bathroom;Assist for transportation   Equipment Recommendations  Other (comment) (tub seat delivered)    Recommendations for Other Services      Precautions / Restrictions Precautions Precautions: Fall       Mobility Bed Mobility Overal bed mobility: Needs Assistance Bed Mobility: Rolling Rolling: Modified independent (Device/Increase time)         General bed mobility comments: pt repositioning for comfort in bed    Transfers                   General transfer comment: declined, not feeling  well     Balance                                           ADL either performed or assessed with clinical judgement   ADL                                         General ADL Comments: Pt with chills and nausea. Offered warm blankets and alerted RN. Pt reports he has been able to perform wound care with intermittent assist of HHRN, but did not demonstrate ability to reach areas. Pt received tub seat, not bench, for home. He was getting down in the tub, immersing wounds. He is aware it is not a tub bench and states the seat delivered is what he wants. Pt asking for reacher for home.    Extremity/Trunk Assessment              Vision       Perception     Praxis      Cognition Arousal/Alertness: Awake/alert Behavior During Therapy: WFL for tasks assessed/performed Overall Cognitive Status: No family/caregiver present to determine baseline cognitive functioning                                 General Comments: Decreased safety awareness, pt aware he needs a new w/c (  having difficulty finding a vendor), now has tub seat so he can avoid immersing wounds in tub        Exercises      Shoulder Instructions       General Comments      Pertinent Vitals/ Pain       Pain Assessment Pain Assessment: Faces Faces Pain Scale: No hurt  Home Living                                          Prior Functioning/Environment              Frequency  Min 2X/week        Progress Toward Goals  OT Goals(current goals can now be found in the care plan section)  Progress towards OT goals: Not progressing toward goals - comment  Acute Rehab OT Goals OT Goal Formulation: With patient Time For Goal Achievement: 09/18/21 Potential to Achieve Goals: Good  Plan Discharge plan remains appropriate    Co-evaluation                 AM-PAC OT "6 Clicks" Daily Activity     Outcome Measure   Help from  another person eating meals?: None Help from another person taking care of personal grooming?: A Little Help from another person toileting, which includes using toliet, bedpan, or urinal?: A Little Help from another person bathing (including washing, rinsing, drying)?: A Little Help from another person to put on and taking off regular upper body clothing?: A Little Help from another person to put on and taking off regular lower body clothing?: A Little 6 Click Score: 19    End of Session    OT Visit Diagnosis: Muscle weakness (generalized) (M62.81);Pain   Activity Tolerance     Patient Left in bed;with call bell/phone within reach   Nurse Communication Patient requests pain meds;Other (comment) (nausea)        Time: 7124-5809 OT Time Calculation (min): 22 min  Charges: OT General Charges $OT Visit: 1 Visit OT Treatments $Self Care/Home Management : 8-22 mins  Berna Spare, OTR/L Acute Rehabilitation Services Office: [phone]   Evern Bio 09/07/2021, 1:02 PM

## 2021-09-07 NOTE — Progress Notes (Signed)
Interim Progress Note  In to see patient after noting a fever of 102.69F, tachycardia to 104 and tachypnea to 23 on vitals.  RN came into the room with me as well. Mother was at the bedside. Patient states that he is feeling much improved. He had some chills but they were not as bad as when he was first admitted. He did have some nausea and received compazine but did not have any vomiting. He was able to keep his lunch down.   O:  Blood pressure 103/66, pulse (!) 110, temperature 99.7 F (37.6 C), temperature source Oral, resp. rate (!) 23, weight 86.2 kg, SpO2 97 %. Gen: NAD, pleasant, non-toxic appearing Resp: Normal effort Card: NSR, 90s on tele  A/P L hip septic arthritis Still having intermittent fevers on IV CTX, Dapto, PO flagyl. Patient looks quite well despite this. He is feeling much improved. BP have stabilized.   -Plan to keep IV abx until afebrile x48 hours -Continue plan for [name] on 8/21 -Monitor vitals and fever curve; asked RN to advise me if patient re-fevers -ID still following, appreciate recommendations

## 2021-09-07 NOTE — Progress Notes (Signed)
Daily Progress Note Intern Pager: [phone]  Patient name: Anthony [name] Medical record number: [id number] Date of birth: 1970-02-10 Age: 51 y.o. Gender: male  Primary Care Provider: Precious Gilding, DO Consultants: PCCM (s/o), ID  Code Status: FULL CODE   Pt Overview and Major Events to Date:  8/15: Admitted for sepsis 8/16: Septic shock, transfered to ICU. Started on pressors 8/18: Off pressors, transferred back to FMTS  Assessment and Plan: DEAKIN LACEK is a 51 y.o. male with PMHx of paraplegia from spina bifida who was admitted for sepsis likely from L hip septic arthritis. He briefly spent time in the ICU on pressors due to progression of septic shock.   * Septic arthritis of hip (HCC) Fever to 101 at 6PM yesterday and 100.4 at 5AM. Continued on ceftriaxone, metronidazole, and daptomycin. Per ID, abx regimen to last for 10 - 14 days, may switch to oral doxy/augmentin on discharge. BCx from 8/15 grew Strep aginosis. Repeat blood cultures show no growth <24 hours. TTE without signs of vegetation, will need to undergo [name].  - per ID recs of 10 - 14 days for total abx regimen, end date is 8/26-30 (start date 8/17) - Tylenol 650 mg q6h PRN for mild pain, fever; monitor fever curve - Monitor BP's, goal MAP >65  - Continue abx regimen: ceftriaxone, metronidazole, and daptomycin - consider switching to oral doxy/augmentin on discharge - [name]  Abx timeline:  CTX: 8/15, 8/17- Daptomycin: 8/16- IV flagyl: 8/16-8/18 PO flagyl: 8/18-  Pressure ulcer Chronic stage IV pressure ulcer on left buttock, stage III and stage II ulcers present in sacral region.  Additional wound on dorsum of right foot noted. Wound care consulted, topical wound care orders in place. No further needs from [place] at this time, bedside nursing to perform wound care. - Wound care per bedside nursing  Prolonged Q-T interval on ECG On flecainide for PVC, per cardiology. Curbsided cardiology who advised to monitor  QTc on telemetry, difficult to read on his EKG. On tele this AM QTc 460s. - Monitor QTc on tele -- Optimize electrolytes (K 4.5), check Mg   FEN/GI: Heart Healthy PPx: Lovenox Dispo: home with home health  in 3 or more days. Barriers include pending [name], repeat blood cultures, transition to PO abx.   Subjective:  Overall patient is doing well.  He reports having a little bit of nausea and vomiting overnight, feels improved with Compazine.  Having slight diarrhea that he attributes to antibiotics. He is hopeful to go home on PO antibiotics, states he has had to go home on IV abx in the past and it was not a great experience.   Objective: Temp:  [98.7 F (37.1 C)-101 F (38.3 C)] 100.2 F (37.9 C) (08/19 0715) Pulse Rate:  [92-104] 102 (08/19 0715) Resp:  [14-35] 25 (08/19 0715) BP: (86-126)/(55-90) 108/70 (08/19 0715) SpO2:  [92 %-97 %] 94 % (08/19 0715) Physical Exam: General: Pleasant, paraplegic, NAD  Cardiovascular: RRR without murmur Respiratory: CTAB anterior fields, normal respiratory effort  Abdomen: Soft, non-distended, slight tenderness RLQ and RUQ without R/G Extremities: atrophied lower extremities, chronic foot deformities   Laboratory: Most recent CBC Lab Results  Component Value Date   WBC 11.5 (H) 09/07/2021   HGB 9.5 (L) 09/07/2021   HCT 29.9 (L) 09/07/2021   MCV 81.9 09/07/2021   PLT 279 09/07/2021   Most recent BMP    Latest Ref Rng & Units 09/07/2021    3:32 AM  BMP  Glucose  70 - 99 mg/dL 98   BUN 6 - 20 mg/dL 10   Creatinine 0.61 - 1.24 mg/dL 0.76   Sodium 135 - 145 mmol/L 140   Potassium 3.5 - 5.1 mmol/L 4.5   Chloride 98 - 111 mmol/L 109   CO2 22 - 32 mmol/L 24   Calcium 8.9 - 10.3 mg/dL 7.6    Other pertinent labs: Alk Phos 310 (previously 246) Repeat Bcx: no growth <24 hours   Imaging/Diagnostic Tests: Result Date: 09/06/2021 ECHOCARDIOGRAM  IMPRESSIONS  1. Left ventricular ejection fraction, by estimation, is 50 to 55%. The left  ventricle has low normal function. The left ventricle has no regional wall motion abnormalities. Left ventricular diastolic parameters were normal.  2. Right ventricular systolic function is normal. The right ventricular size is normal. There is mildly elevated pulmonary artery systolic pressure.  3. The mitral valve is normal in structure. No evidence of mitral valve regurgitation.  4. The aortic valve is tricuspid. Aortic valve regurgitation is not visualized.  5. The inferior vena cava is normal in size with <50% respiratory variability, suggesting right atrial pressure of 8 mmHg. Comparison(s): No significant change from prior study.   Sharion Settler, DO 09/07/2021, 10:05 AM  PGY-3, [place] Intern pager: [phone], text pages welcome Secure chat group [place]

## 2021-09-08 ENCOUNTER — Inpatient Hospital Stay (HOSPITAL_COMMUNITY): Payer: Medicare Other

## 2021-09-08 DIAGNOSIS — A419 Sepsis, unspecified organism: Secondary | ICD-10-CM | POA: Diagnosis not present

## 2021-09-08 DIAGNOSIS — S71002A Unspecified open wound, left hip, initial encounter: Secondary | ICD-10-CM | POA: Diagnosis not present

## 2021-09-08 DIAGNOSIS — R9431 Abnormal electrocardiogram [ECG] [EKG]: Secondary | ICD-10-CM | POA: Diagnosis not present

## 2021-09-08 DIAGNOSIS — R6520 Severe sepsis without septic shock: Secondary | ICD-10-CM | POA: Diagnosis not present

## 2021-09-08 DIAGNOSIS — S73005A Unspecified dislocation of left hip, initial encounter: Secondary | ICD-10-CM | POA: Diagnosis not present

## 2021-09-08 DIAGNOSIS — S73002A Unspecified subluxation of left hip, initial encounter: Secondary | ICD-10-CM | POA: Diagnosis not present

## 2021-09-08 DIAGNOSIS — L89522 Pressure ulcer of left ankle, stage 2: Secondary | ICD-10-CM | POA: Diagnosis not present

## 2021-09-08 DIAGNOSIS — K7200 Acute and subacute hepatic failure without coma: Secondary | ICD-10-CM | POA: Diagnosis not present

## 2021-09-08 DIAGNOSIS — L03116 Cellulitis of left lower limb: Secondary | ICD-10-CM | POA: Diagnosis not present

## 2021-09-08 DIAGNOSIS — E876 Hypokalemia: Secondary | ICD-10-CM | POA: Diagnosis not present

## 2021-09-08 DIAGNOSIS — M009 Pyogenic arthritis, unspecified: Secondary | ICD-10-CM | POA: Diagnosis not present

## 2021-09-08 LAB — BASIC METABOLIC PANEL
Anion gap: 7 (ref 5–15)
BUN: 9 mg/dL (ref 6–20)
CO2: 22 mmol/L (ref 22–32)
Calcium: 7.9 mg/dL — ABNORMAL LOW (ref 8.9–10.3)
Chloride: 107 mmol/L (ref 98–111)
Creatinine, Ser: 0.74 mg/dL (ref 0.61–1.24)
GFR, Estimated: >60 mL/min (ref >60)
Glucose, Bld: 97 mg/dL (ref 70–99)
Potassium: 4.6 mmol/L (ref 3.5–5.1)
Sodium: 136 mmol/L (ref 135–145)

## 2021-09-08 LAB — CULTURE, BLOOD (ROUTINE X 2): Special Requests: ADEQUATE

## 2021-09-08 LAB — MAGNESIUM: Magnesium: 1.9 mg/dL (ref 1.7–2.4)

## 2021-09-08 MED ORDER — LORAZEPAM 1 MG PO TABS
1.0000 mg | ORAL_TABLET | Freq: Once | ORAL | Status: AC | PRN
Start: 2021-09-08 — End: 2021-09-08
  Administered 2021-09-08: 1 mg via ORAL

## 2021-09-08 MED ORDER — MAGNESIUM SULFATE IN D5W 1-5 GM/100ML-% IV SOLN
1.0000 g | Freq: Once | INTRAVENOUS | Status: AC
Start: 2021-09-08 — End: 2021-09-08
  Administered 2021-09-08: 1 g via INTRAVENOUS
  Filled 2021-09-08: qty 100

## 2021-09-08 MED ORDER — LORAZEPAM 1 MG PO TABS
1.0000 mg | ORAL_TABLET | Freq: Once | ORAL | Status: DC | PRN
  Filled 2021-09-08: qty 1

## 2021-09-08 MED ORDER — GADOBUTROL 1 MMOL/ML IV SOLN
8.4000 mL | Freq: Once | INTRAVENOUS | Status: AC | PRN
  Administered 2021-09-08: 8.4 mL via INTRAVENOUS

## 2021-09-08 MED ORDER — IBUPROFEN 200 MG PO TABS
600.0000 mg | ORAL_TABLET | Freq: Four times a day (QID) | ORAL | Status: DC | PRN
  Administered 2021-09-08 – 2021-09-23 (×4): 600 mg via ORAL
  Filled 2021-09-08 (×4): qty 1
  Filled 2021-09-08: qty 3

## 2021-09-08 NOTE — Progress Notes (Addendum)
Patient has continued to fever despite 5 days of antibiotics that should effectively cover his Strep anginosis raising concern for inadequate source control. Presumed source had been his hip which was evaluated by orthopedics and IR last week with the recommendation for continued management with IV antibiotics, deferring operative intervention.  However, MRI today raises concern for two large abscesses/fluid collections and marrow edema in the posterior acetabulum raising concern for osteomyelitis.  - Discussed case with Dr. Charlann Boxer, orthopedics, who will see patient.  - Patient will be NPO at midnight ahead of possible operative intervention. - Note that patient is currently scheduled for [name] tomorrow also. This may need to be delayed pending the urgency of orthopedic intervention.  - Patient and mother updated at bedside  Dorothyann Gibbs, MD 09/08/2021 3:23 PM

## 2021-09-08 NOTE — Progress Notes (Signed)
Infectious disease follow-up note:  Remains febrile over the last 24 hours with Tmax 102.4.  Hemodynamically stable.  [name] planned for tomorrow.  Repeat blood cultures 8/18 no growth to date and 8/15 blood cultures with susceptible Streptococcus anginosus.  He remains on broad-spectrum antimicrobial coverage with ceftriaxone, daptomycin, metronidazole.  Imaging on admission notable for large decubitus ulcer involving the left buttocks more or less the same since his prior exam.  Also noted air within the left hip concerning for possible septic arthritis.  Given ongoing fevers despite broad-spectrum antimicrobial coverage and repeat negative blood cultures, raises concern for inadequate source control.  Additionally Streptococcus anginosus known to cause abscess formation.  Recommend repeat imaging of the hip to assess for any interval changes that may require surgical intervention.  Will continue current antibiotics but hopefully can narrow soon.    Vedia Coffer for Infectious Disease [facility] 09/08/2021, 11:08 AM

## 2021-09-08 NOTE — Progress Notes (Addendum)
Daily Progress Note Intern Pager: [phone]  Patient name: Anthony [name] Medical record number: [id number] Date of birth: 1970-06-22 Age: 51 y.o. Gender: male  Primary Care Provider: Erick Alley, DO Consultants: PCCM (s/o s/p ICU stay), ID Code Status: Full  Pt Overview and Major Events to Date:  8/15- Admitted for sepsis 8/16: Septic shock, transfered to ICU. Started on pressors 8/18: Off pressors, transferred back to FMTS  Assessment and Plan: Anthony [name] is a 51 y.o. male with PMHx of paraplegia from spina bifida who was admitted for sepsis likely from L hip septic arthritis. He briefly spent time in the ICU on pressors due to progression of septic shock, now stabilized though persistently febrile, receiving IV antibiotics.   * Septic arthritis of hip (HCC) Continues to fever to 102.3 this morning. Continued on ceftriaxone, metronidazole, and daptomycin. Per ID, abx regimen to last for 10 - 14 days, may switch to oral doxy/augmentin on discharge. BCx from 8/15 grew pan-sensitive Strep anginosis. Repeat blood cultures show no growth <24 hours. Note also Urine culture with VRE. TTE without signs of vegetation, will need to undergo [name]. Given persistent fever despite 5 days of therapy that should be effective, concerned for inadequate source control.  - Will repeat hip imaging and if worsening, re-engage ortho re: source control - [name] 8/21  - per ID recs of 10 - 14 days for total abx regimen, end date is 8/26-30 (start date 8/17) - Tylenol 650 mg q6h PRN for mild pain, fever; monitor fever curve - Continue abx regimen: ceftriaxone, metronidazole, and daptomycin - consider switching to oral doxy/augmentin on discharge   Abx timeline:  CTX: 8/15, 8/17- Daptomycin: 8/16- IV flagyl: 8/16-8/18 PO flagyl: 8/18-  Pressure ulcer Chronic stage IV pressure ulcer on left buttock, stage III and stage II ulcers present in sacral region.  Additional wound on dorsum of right foot  noted. Wound care consulted, topical wound care orders in place. No further needs from [facility] at this time, bedside nursing to perform wound care. - Wound care per bedside nursing  Prolonged Q-T interval on ECG On flecainide for PVC, per cardiology. Curbsided cardiology who advised to monitor QTc on telemetry, difficult to read on his EKG. On tele this AM QTc 460s. - Monitor QTc on tele -- Optimize electrolytes K>4, Mg >2    FEN/GI: Heart Healthy, NPO at midnight PPx: Lovenox  Dispo:Home with home health after completing IV abx course. Barriers include TTE, ongoing IV antibiotic therapy.   Subjective:  [name] reports feeling "sick" this morning. Complaining of ongoing chills and nausea through the night. Feels like his fever is back.   Objective: Temp:  [99 F (37.2 C)-102.4 F (39.1 C)] 102.3 F (39.1 C) (08/20 0739) Pulse Rate:  [92-110] 99 (08/20 0739) Resp:  [20-34] 20 (08/20 0739) BP: (96-128)/(53-78) 127/78 (08/20 0739) SpO2:  [92 %-97 %] 94 % (08/20 0739) Weight:  [83.7 kg] 83.7 kg (08/20 0241) Physical Exam: General: Paraplegic, NAD Cardiovascular: RRR, without murmur, rub, or gallop Respiratory: Clear throughout, normal WOB on RA Abdomen: Soft, non-tender, non-distended Extremities: Deformed LE at baseline, no new edema  Laboratory: Most recent CBC Lab Results  Component Value Date   WBC 11.5 (H) 09/07/2021   HGB 9.5 (L) 09/07/2021   HCT 29.9 (L) 09/07/2021   MCV 81.9 09/07/2021   PLT 279 09/07/2021   Most recent BMP    Latest Ref Rng & Units 09/08/2021    4:42 AM  BMP  Glucose 70 - 99 mg/dL 97   BUN 6 - 20 mg/dL 9   Creatinine 4.82 - 7.07 mg/dL 8.67   Sodium 544 - 920 mmol/L 136   Potassium 3.5 - 5.1 mmol/L 4.6   Chloride 98 - 111 mmol/L 107   CO2 22 - 32 mmol/L 22   Calcium 8.9 - 10.3 mg/dL 7.9      Imaging/Diagnostic Tests: No new imaging, tests  Alicia Amel, MD 09/08/2021, 9:03 AM  PGY-2, [place] Family Medicine FPTS Intern pager:  [phone], text pages welcome Secure chat group [facility] Teaching Service

## 2021-09-09 ENCOUNTER — Encounter (HOSPITAL_COMMUNITY)
Admission: EM | Disposition: A | Discharge status: Skilled Nursing Facility | Payer: Self-pay | Source: Home / Self Care | Attending: Family Medicine

## 2021-09-09 ENCOUNTER — Encounter (HOSPITAL_COMMUNITY): Payer: Self-pay | Admitting: Family Medicine

## 2021-09-09 ENCOUNTER — Inpatient Hospital Stay (HOSPITAL_COMMUNITY): Payer: Medicare Other

## 2021-09-09 ENCOUNTER — Inpatient Hospital Stay (HOSPITAL_COMMUNITY): Payer: Medicare Other | Admitting: Certified Registered Nurse Anesthetist

## 2021-09-09 ENCOUNTER — Ambulatory Visit: Payer: Medicare Other

## 2021-09-09 DIAGNOSIS — R079 Chest pain, unspecified: Secondary | ICD-10-CM | POA: Diagnosis not present

## 2021-09-09 DIAGNOSIS — I083 Combined rheumatic disorders of mitral, aortic and tricuspid valves: Secondary | ICD-10-CM | POA: Diagnosis not present

## 2021-09-09 DIAGNOSIS — L89522 Pressure ulcer of left ankle, stage 2: Secondary | ICD-10-CM | POA: Diagnosis not present

## 2021-09-09 DIAGNOSIS — I071 Rheumatic tricuspid insufficiency: Secondary | ICD-10-CM | POA: Diagnosis not present

## 2021-09-09 DIAGNOSIS — R9431 Abnormal electrocardiogram [ECG] [EKG]: Secondary | ICD-10-CM | POA: Diagnosis not present

## 2021-09-09 DIAGNOSIS — F419 Anxiety disorder, unspecified: Secondary | ICD-10-CM | POA: Diagnosis not present

## 2021-09-09 DIAGNOSIS — R7881 Bacteremia: Secondary | ICD-10-CM | POA: Diagnosis not present

## 2021-09-09 DIAGNOSIS — M1990 Unspecified osteoarthritis, unspecified site: Secondary | ICD-10-CM | POA: Diagnosis not present

## 2021-09-09 DIAGNOSIS — I10 Essential (primary) hypertension: Secondary | ICD-10-CM | POA: Diagnosis not present

## 2021-09-09 DIAGNOSIS — I059 Rheumatic mitral valve disease, unspecified: Secondary | ICD-10-CM

## 2021-09-09 DIAGNOSIS — M009 Pyogenic arthritis, unspecified: Secondary | ICD-10-CM | POA: Diagnosis not present

## 2021-09-09 DIAGNOSIS — A419 Sepsis, unspecified organism: Secondary | ICD-10-CM | POA: Diagnosis not present

## 2021-09-09 DIAGNOSIS — B9689 Other specified bacterial agents as the cause of diseases classified elsewhere: Secondary | ICD-10-CM

## 2021-09-09 DIAGNOSIS — I330 Acute and subacute infective endocarditis: Secondary | ICD-10-CM

## 2021-09-09 DIAGNOSIS — M869 Osteomyelitis, unspecified: Secondary | ICD-10-CM | POA: Diagnosis not present

## 2021-09-09 DIAGNOSIS — L89329 Pressure ulcer of left buttock, unspecified stage: Secondary | ICD-10-CM | POA: Diagnosis not present

## 2021-09-09 DIAGNOSIS — K820 Obstruction of gallbladder: Secondary | ICD-10-CM | POA: Diagnosis not present

## 2021-09-09 DIAGNOSIS — N210 Calculus in bladder: Secondary | ICD-10-CM | POA: Diagnosis not present

## 2021-09-09 HISTORY — PX: TEE WITHOUT CARDIOVERSION: SHX5443

## 2021-09-09 HISTORY — PX: BUBBLE STUDY: SHX6837

## 2021-09-09 LAB — CBC
HCT: 31.2 % — ABNORMAL LOW (ref 39.0–52.0)
Hemoglobin: 9.8 g/dL — ABNORMAL LOW (ref 13.0–17.0)
MCH: 25.8 pg — ABNORMAL LOW (ref 26.0–34.0)
MCHC: 31.4 g/dL (ref 30.0–36.0)
MCV: 82.1 fL (ref 80.0–100.0)
Platelets: 348 10*3/uL (ref 150–400)
RBC: 3.8 MIL/uL — ABNORMAL LOW (ref 4.22–5.81)
RDW: 15.6 % — ABNORMAL HIGH (ref 11.5–15.5)
WBC: 14.9 10*3/uL — ABNORMAL HIGH (ref 4.0–10.5)
nRBC: 0 % (ref 0.0–0.2)

## 2021-09-09 LAB — BASIC METABOLIC PANEL
Anion gap: 8 (ref 5–15)
BUN: 10 mg/dL (ref 6–20)
CO2: 25 mmol/L (ref 22–32)
Calcium: 8 mg/dL — ABNORMAL LOW (ref 8.9–10.3)
Chloride: 107 mmol/L (ref 98–111)
Creatinine, Ser: 0.69 mg/dL (ref 0.61–1.24)
GFR, Estimated: >60 mL/min (ref >60)
Glucose, Bld: 95 mg/dL (ref 70–99)
Potassium: 4.2 mmol/L (ref 3.5–5.1)
Sodium: 140 mmol/L (ref 135–145)

## 2021-09-09 LAB — MAGNESIUM: Magnesium: 1.9 mg/dL (ref 1.7–2.4)

## 2021-09-09 SURGERY — ECHOCARDIOGRAM, TRANSESOPHAGEAL
Anesthesia: Monitor Anesthesia Care

## 2021-09-09 MED ORDER — PROPOFOL 10 MG/ML IV BOLUS
INTRAVENOUS | Status: DC | PRN
  Administered 2021-09-09: 30 mg via INTRAVENOUS

## 2021-09-09 MED ORDER — PHENYLEPHRINE 80 MCG/ML (10ML) SYRINGE FOR IV PUSH (FOR BLOOD PRESSURE SUPPORT)
PREFILLED_SYRINGE | INTRAVENOUS | Status: DC | PRN
  Administered 2021-09-09 (×4): 160 ug via INTRAVENOUS

## 2021-09-09 MED ORDER — SODIUM CHLORIDE 0.9 % IV SOLN
INTRAVENOUS | Status: AC
Start: 2021-09-09 — End: 2021-09-09

## 2021-09-09 MED ORDER — IOHEXOL 300 MG/ML  SOLN
100.0000 mL | Freq: Once | INTRAMUSCULAR | Status: AC | PRN
  Administered 2021-09-09: 100 mL via INTRAVENOUS

## 2021-09-09 MED ORDER — PROPOFOL 500 MG/50ML IV EMUL
INTRAVENOUS | Status: DC | PRN
  Administered 2021-09-09: 200 ug/kg/min via INTRAVENOUS

## 2021-09-09 MED ORDER — IOHEXOL 9 MG/ML PO SOLN
500.0000 mL | ORAL | Status: AC
  Administered 2021-09-09 (×2): 500 mL via ORAL

## 2021-09-09 MED ORDER — SODIUM CHLORIDE 0.9 % IV SOLN
10.0000 mg/kg | Freq: Every day | INTRAVENOUS | Status: DC
  Administered 2021-09-09: 850 mg via INTRAVENOUS
  Filled 2021-09-09 (×2): qty 17

## 2021-09-09 MED ORDER — LIDOCAINE 2% (20 MG/ML) 5 ML SYRINGE
INTRAMUSCULAR | Status: DC | PRN
  Administered 2021-09-09: 100 mg via INTRAVENOUS

## 2021-09-09 NOTE — TOC Progression Note (Addendum)
Transition of Care [facility]) - Progression Note    Patient Details  Name: Anthony [name] MRN: [id number] Date of Birth: 12-Dec-1970  Transition of Care [facility]) CM/SW [place],  Phone Number: 09/09/2021, 3:53 PM  Clinical Narrative:     [name] notified by OT that pt agreeable to SNF now; also interested in [place]. [name] met with pt and pt's mother bedside to discuss disposition. Pt lives at home alone in [place]. Pt does confirm he is agreeable to SNF. He does not want to go to [facility]. Pt reports that his Medicare is going to expire at the end of the year and is wondering if he qualifies for medicaid. [name] made referral to financial counseling for medicaid screening. Fl2 completed and bed requests sent in hub.   Expected Discharge Plan: Skilled Nursing Facility Barriers to Discharge: SNF Pending bed offer, Continued Medical Work up  Expected Discharge Plan and Services Expected Discharge Plan: [place]   Discharge Planning Services: CM Consult Post Acute Care Choice: [place] arrangements for the past 2 months: Apartment                 DME Arranged: Programmer, multimedia DME Agency: [employer] Date DME Agency Contacted: 09/05/21 Time DME Agency Contacted: 50 Representative spoke with at DME Agency: Jodell Cipro HH Arranged: PT, RN [place] Agency: [facility] Date [place]: 09/05/21 Time [place]: 1300 Representative spoke with at [place]: Delsa Sale   Social Determinants of Health ([place]) Interventions    Readmission Risk Interventions     No data to display

## 2021-09-09 NOTE — Interval H&P Note (Signed)
History and Physical Interval Note:  09/09/2021 10:27 AM  Anthony [name]  has presented today for surgery, with the diagnosis of BACTEREMIA.  The various methods of treatment have been discussed with the patient and family. After consideration of risks, benefits and other options for treatment, the patient has consented to  Procedure(s): TRANSESOPHAGEAL ECHOCARDIOGRAM ([name]) (N/A) as a surgical intervention.  The patient's history has been reviewed, patient examined, no change in status, stable for surgery.  I have reviewed the patient's chart and labs.  Questions were answered to the patient's satisfaction.     [name]

## 2021-09-09 NOTE — Progress Notes (Signed)
  Echocardiogram Echocardiogram Transesophageal has been performed.  Anthony [name] 09/09/2021, 11:41 AM

## 2021-09-09 NOTE — Progress Notes (Signed)
[facility]  Date of Admission:  09/03/2021     Abx: 8/16-c dapto 8/16-c ceftriaxone      8/16-c metronidazole                                                     Assessment: 51 yo spina bifida with partial paraplegia, hypospadia/urethro-cutaneous fistula, chronic suprapubic catheter, bilateral hip dysplasia, hx ischiopubic abscess 2019 s/p debridement, chronic draining left hip sinus tract, admitted 8/16 with septic shock in setting several days increased left  hip discharge  W/u thus far Admission bcx 1 set done. Strep angi. Repeat bcx negative.  8/20 mri hip confirmed septic arthritis with associated periarticular abscesses [name] 8/21 showed mv vegetation    ------------------- 8/21 assessment Ongoing fever I suspect the hip process. He has vre in urine and I query if it is in the hip as well which will need higher daptomycin dose  The strep anginosus in the blood now with tee finding suggestive of mv endocarditis. I also would be wary of intraabodminal abscesses as well as strep anginosus likes to do that (distant pyogenic focus) -- especially with the ongoing fever that at this time I can't ascertain if it is all due to hip    This is a rather complicated picture Would get abd/pelv ct  Would also reengage IR or ortho regarding the hip process in setting ongoing fever. Probably could wait until abd/pelv ct         Plan: Increase daptomycin to VRE coverage dose Continue ceftriaxone Continue metronidazole CT abd/pelv with contrast Further recommendation pending ct abd pelv Discussed with primary team  I spent more than 50 minute reviewing data/chart, and coordinating care and >50% direct face to face time providing counseling/discussing diagnostics/treatment plan with patient    Principal Problem:   Septic arthritis of hip (HCC) Active Problems:   Pressure ulcer   Prolonged Q-T interval on ECG   Osteomyelitis (HCC)    Pyogenic arthritis of left hip (HCC)   Hypokalemia   Sepsis with acute liver failure without hepatic coma (HCC)   Bacteremia   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   Allergies  Allergen Reactions   Vancomycin Itching    Scheduled Meds:  Chlorhexidine Gluconate Cloth  6 each Topical Daily   enoxaparin (LOVENOX) injection  40 mg Subcutaneous Daily   flecainide  50 mg Oral BID   metroNIDAZOLE  500 mg Oral Q12H   rosuvastatin  40 mg Oral Daily   sodium hypochlorite   Irrigation BID   Continuous Infusions:  sodium chloride 10 mL/hr at 09/09/21 1114   sodium chloride 120 mL/hr at 09/09/21 1313   cefTRIAXone (ROCEPHIN)  IV 2 g (09/09/21 0045)   DAPTOmycin (CUBICIN) 700 mg in sodium chloride 0.9 % IVPB 700 mg (09/08/21 2021)   PRN Meds:.acetaminophen, famotidine, ibuprofen, lip balm, loratadine, LORazepam **AND** [COMPLETED] LORazepam, mouth rinse, prochlorperazine   SUBJECTIVE: Repeat bcx negative Mri done over weekend confirmed left hip joint abscesses  Continues to fever through weekend   [name] today showed possible mv vegetation    Review of Systems: ROS All other ROS was negative, except mentioned above     OBJECTIVE: Vitals:   09/09/21 1145 09/09/21 1150 09/09/21 1200 09/09/21 1210  BP: (!) 84/44 (!) 88/49 Marland Kitchen(!)  88/58 102/62  Pulse: 90 85 87 92  Resp: (!) 30 (!) 21 (!) 27 (!) 26  Temp: 98.4 F (36.9 C)     TempSrc: Temporal     SpO2: 93% 92% 94% 93%  Weight:      Height:       Body mass index is 28.59 kg/m.  Physical Exam General/constitutional: no distress, pleasant HEENT: Normocephalic, PER, Conj Clear, EOMI, Oropharynx clear Neck supple CV: rrr no mrg Lungs: clear to auscultation, normal respiratory effort Abd: Soft, Nontender Ext: no edema Neuro/msk: stable paraplegia    Lab Results Lab Results  Component Value Date   WBC 14.9 (H) 09/09/2021   HGB 9.8 (L) 09/09/2021   HCT 31.2 (L) 09/09/2021   MCV 82.1 09/09/2021   PLT  348 09/09/2021    Lab Results  Component Value Date   CREATININE 0.69 09/09/2021   BUN 10 09/09/2021   NA 140 09/09/2021   K 4.2 09/09/2021   CL 107 09/09/2021   CO2 25 09/09/2021    Lab Results  Component Value Date   ALT 40 09/07/2021   AST 38 09/07/2021   ALKPHOS 310 (H) 09/07/2021   BILITOT 0.4 09/07/2021      Microbiology: Recent Results (from the past 240 hour(s))  Resp Panel by RT-PCR (Flu A&B, Covid) Anterior Nasal Swab     Status: None   Collection Time: 09/03/21  3:36 PM   Specimen: Anterior Nasal Swab  Result Value Ref Range Status   SARS Coronavirus 2 by RT PCR NEGATIVE NEGATIVE Final    Comment: (NOTE) SARS-CoV-2 target nucleic acids are NOT DETECTED.  The SARS-CoV-2 RNA is generally detectable in upper respiratory specimens during the acute phase of infection. The lowest concentration of SARS-CoV-2 viral copies this assay can detect is 138 copies/mL. A negative result does not preclude SARS-Cov-2 infection and should not be used as the sole basis for treatment or other patient management decisions. A negative result may occur with  improper specimen collection/handling, submission of specimen other than nasopharyngeal swab, presence of viral mutation(s) within the areas targeted by this assay, and inadequate number of viral copies(<138 copies/mL). A negative result must be combined with clinical observations, patient history, and epidemiological information. The expected result is Negative.  Fact Sheet for Patients:  BloggerCourse.com  Fact Sheet for Healthcare Providers:  SeriousBroker.it  This test is no t yet approved or cleared by the [place] FDA and  has been authorized for detection and/or diagnosis of SARS-CoV-2 by FDA under an Emergency Use Authorization (EUA). This EUA will remain  in effect (meaning this test can be used) for the duration of the COVID-19 declaration under Section  564(b)(1) of the Act, 21 U.S.C.section 360bbb-3(b)(1), unless the authorization is terminated  or revoked sooner.       Influenza A by PCR NEGATIVE NEGATIVE Final   Influenza B by PCR NEGATIVE NEGATIVE Final    Comment: (NOTE) The Xpert Xpress SARS-CoV-2/FLU/RSV plus assay is intended as an aid in the diagnosis of influenza from Nasopharyngeal swab specimens and should not be used as a sole basis for treatment. Nasal washings and aspirates are unacceptable for Xpert Xpress SARS-CoV-2/FLU/RSV testing.  Fact Sheet for Patients: BloggerCourse.com  Fact Sheet for Healthcare Providers: SeriousBroker.it  This test is not yet approved or cleared by the [place] FDA and has been authorized for detection and/or diagnosis of SARS-CoV-2 by FDA under an Emergency Use Authorization (EUA). This EUA will remain in effect (meaning this test can be  used) for the duration of the COVID-19 declaration under Section 564(b)(1) of the Act, 21 U.S.C. section 360bbb-3(b)(1), unless the authorization is terminated or revoked.  Performed at [facility], [address] [place], [place] [address]   Urine Culture     Status: Abnormal   Collection Time: 09/03/21  3:36 PM   Specimen: In/Out Cath Urine  Result Value Ref Range Status   Specimen Description IN/OUT CATH URINE  Final   Special Requests   Final    NONE Performed at [facility], [address], [place], [place] [address]    Culture (A)  Final    >=100,000 COLONIES/mL VANCOMYCIN RESISTANT ENTEROCOCCUS   Report Status 09/05/2021 FINAL  Final   Organism ID, Bacteria VANCOMYCIN RESISTANT ENTEROCOCCUS (A)  Final      Susceptibility   Vancomycin resistant enterococcus - MIC*    AMPICILLIN <=2 SENSITIVE Sensitive     NITROFURANTOIN <=16 SENSITIVE Sensitive     VANCOMYCIN >=32 RESISTANT Resistant     * >=100,000 COLONIES/mL VANCOMYCIN RESISTANT ENTEROCOCCUS  Blood Culture  (routine x 2)     Status: Abnormal   Collection Time: 09/03/21  4:18 PM   Specimen: BLOOD  Result Value Ref Range Status   Specimen Description BLOOD SITE NOT SPECIFIED  Final   Special Requests   Final    BOTTLES DRAWN AEROBIC AND ANAEROBIC Blood Culture adequate volume   Culture  Setup Time   Final    GRAM POSITIVE COCCI IN CHAINS ANAEROBIC BOTTLE ONLY CRITICAL RESULT CALLED TO, READ BACK BY AND VERIFIED WITH: SYDNEY DAVIS [id number] AT 1138 BY EC Performed at [facility], [address], [place], [place] [address]    Culture STREPTOCOCCUS ANGINOSIS (A)  Final   Report Status 09/08/2021 FINAL  Final   Organism ID, Bacteria STREPTOCOCCUS ANGINOSIS  Final      Susceptibility   Streptococcus anginosis - MIC*    PENICILLIN <=0.06 SENSITIVE Sensitive     CEFTRIAXONE 0.5 SENSITIVE Sensitive     ERYTHROMYCIN <=0.12 SENSITIVE Sensitive     LEVOFLOXACIN 0.5 SENSITIVE Sensitive     VANCOMYCIN 0.5 SENSITIVE Sensitive     * STREPTOCOCCUS ANGINOSIS  Blood Culture ID Panel (Reflexed)     Status: Abnormal   Collection Time: 09/03/21  4:18 PM  Result Value Ref Range Status   Enterococcus faecalis NOT DETECTED NOT DETECTED Final   Enterococcus Faecium NOT DETECTED NOT DETECTED Final   Listeria monocytogenes NOT DETECTED NOT DETECTED Final   Staphylococcus species NOT DETECTED NOT DETECTED Final   Staphylococcus aureus (BCID) NOT DETECTED NOT DETECTED Final   Staphylococcus epidermidis NOT DETECTED NOT DETECTED Final   Staphylococcus lugdunensis NOT DETECTED NOT DETECTED Final   Streptococcus species DETECTED (A) NOT DETECTED Final    Comment: Not Enterococcus species, Streptococcus agalactiae, Streptococcus pyogenes, or Streptococcus pneumoniae. CRITICAL RESULT CALLED TO, READ BACK BY AND VERIFIED WITH: PHARMD SYDNEY DAVIS [id number] AT 1138 BY EC    Streptococcus agalactiae NOT DETECTED NOT DETECTED Final   Streptococcus pneumoniae NOT DETECTED NOT DETECTED Final   Streptococcus  pyogenes NOT DETECTED NOT DETECTED Final   A.calcoaceticus-baumannii NOT DETECTED NOT DETECTED Final   Bacteroides fragilis NOT DETECTED NOT DETECTED Final   Enterobacterales NOT DETECTED NOT DETECTED Final   Enterobacter cloacae complex NOT DETECTED NOT DETECTED Final   Escherichia coli NOT DETECTED NOT DETECTED Final   Klebsiella aerogenes NOT DETECTED NOT DETECTED Final   Klebsiella oxytoca NOT DETECTED NOT DETECTED Final  Klebsiella pneumoniae NOT DETECTED NOT DETECTED Final   Proteus species NOT DETECTED NOT DETECTED Final   Salmonella species NOT DETECTED NOT DETECTED Final   Serratia marcescens NOT DETECTED NOT DETECTED Final   Haemophilus influenzae NOT DETECTED NOT DETECTED Final   Neisseria meningitidis NOT DETECTED NOT DETECTED Final   Pseudomonas aeruginosa NOT DETECTED NOT DETECTED Final   Stenotrophomonas maltophilia NOT DETECTED NOT DETECTED Final   Candida albicans NOT DETECTED NOT DETECTED Final   Candida auris NOT DETECTED NOT DETECTED Final   Candida glabrata NOT DETECTED NOT DETECTED Final   Candida krusei NOT DETECTED NOT DETECTED Final   Candida parapsilosis NOT DETECTED NOT DETECTED Final   Candida tropicalis NOT DETECTED NOT DETECTED Final   Cryptococcus neoformans/gattii NOT DETECTED NOT DETECTED Final    Comment: Performed at [facility], [address], [place], [place] [address]  MRSA Next Gen by PCR, Nasal     Status: None   Collection Time: 09/05/21  1:20 AM   Specimen: Nasal Mucosa; Nasal Swab  Result Value Ref Range Status   MRSA by PCR Next Gen NOT DETECTED NOT DETECTED Final    Comment: (NOTE) The GeneXpert MRSA Assay (FDA approved for NASAL specimens only), is one component of a comprehensive MRSA colonization surveillance program. It is not intended to diagnose MRSA infection nor to guide or monitor treatment for MRSA infections. Test performance is not FDA approved in patients less than 80 years old. Performed at [facility],  [address] [place], [place] [address]   Culture, blood (Routine X 2) w Reflex to ID Panel     Status: None (Preliminary result)   Collection Time: 09/06/21  1:29 PM   Specimen: BLOOD  Result Value Ref Range Status   Specimen Description BLOOD RIGHT ANTECUBITAL  Final   Special Requests IN PEDIATRIC BOTTLE Blood Culture adequate volume  Final   Culture   Final    NO GROWTH 3 DAYS Performed at [facility], [address], [place], [place] [address]    Report Status PENDING  Incomplete  Culture, blood (Routine X 2) w Reflex to ID Panel     Status: None (Preliminary result)   Collection Time: 09/06/21  1:29 PM   Specimen: BLOOD RIGHT HAND  Result Value Ref Range Status   Specimen Description BLOOD RIGHT HAND  Final   Special Requests IN PEDIATRIC BOTTLE Blood Culture adequate volume  Final   Culture   Final    NO GROWTH 3 DAYS Performed at [facility], [address] [place], [place] [address]    Report Status PENDING  Incomplete     Serology:   Imaging: If present, new imagings (plain films, ct scans, and mri) have been personally visualized and interpreted; radiology reports have been reviewed. Decision making incorporated into the Impression / Recommendations.  8/20 mri left hip 1. Soft tissue wound overlying the left ischial tuberosity with surrounding severe cellulitis. Complex multiloculated fluid collection with air in the fluid overlying the posterolateral aspect of the greater trochanter and extending into the proximal thigh measuring 6.8 x 4.3 x 8.3 cm consistent with an abscess. Second complex fluid collection extends medial to the proximal femur along the lesser trochanter measuring 2.6 x 8.4 x 2.8 cm. Mild marrow edema in the posterior left acetabulum concerning for osteomyelitis. 2. Muscle edema in the left adductor musculature and to lesser extent right adductor musculature which may reflect neurogenic edema versus infectious myositis.  8/15  abd pelv ct with contrast Multiple large bladder stones are noted. Urinary bladder is decompressed secondary to Foley catheter.   Continued presence of large decubitus ulcer seen involving the left buttocks which is not significantly changed compared to prior exam. It is seen to extend to the base of the penis.   Stable chronic changes are seen involving both hip joints consistent with hip dysplasia and heterotopic bone formation. However, there is now noted air within the left hip joint concerning for possible septic arthritis.    Raymondo Band, MD [facility] [phone] pager    09/09/2021, 2:09 PM

## 2021-09-09 NOTE — Progress Notes (Signed)
Pharmacy Antibiotic Note  Anthony [name] is a 51 y.o. male admitted on 09/03/2021 presenting with sepsis (fever, hypotension, elevated lactic acid) likely due to chronic decubitus wounds now with drainage. 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 Further has chronic draining let hip sinus tract with CT pelvis demonstrating air tracking.  Pharmacy has been consulted for daptomycin dosing. Noted patient is paraplegic with baseline Scr ~0.6-0.7.   CK 19, Scr 0.69, WBC 14.9 (on 8/21), afebrile  MRI of hip on 8/20 showed fluid collection with air concerning for an abscess. [name] on 8/21 showed probable anterior MV leaflet vegetation. Will increase daptomycin dose to 10 mg/kg.   Plan: Increase Daptomycin to 850mg  q24h  Continue Ceftriaxone 2g IV Q24H  Continue metronidazole 500 mg IV Q12H  Monitor weekly BMP, QFri CK, cultures, LOT Height: 5\' 8"  (172.7 cm) Weight: 85.3 kg (188 lb 0.8 oz) IBW/kg (Calculated) : 68.4  Temp (24hrs), Avg:98.8 F (37.1 C), Min:97.7 F (36.5 C), Max:101 F (38.3 C)  Recent Labs  Lab 09/03/21 1618 09/03/21 1937 09/03/21 2201 09/04/21 0428 09/05/21 0609 09/06/21 0117 09/07/21 0332 09/08/21 0442 09/09/21 0746  WBC 22.0*  --  18.6* 15.5* 10.8*  --  11.5*  --  14.9*  CREATININE 2.34*  --  1.57* 1.17 0.85 0.84 0.76 0.74 0.69  LATICACIDVEN 3.8* 1.7  --   --   --   --   --   --   --      Estimated Creatinine Clearance: 116.2 mL/min (by C-G formula based on SCr of 0.69 mg/dL).    Allergies  Allergen Reactions   Vancomycin Itching   Antimicrobials: Dapto 8/15 >>  MTZ 8/16 >>  CTX 8/15 >>  8/16 CK 92 8/18 CK 19  Microbiology  8/15 Ucx: VRE (S-ampicillin)  8/15 Bcx: streptococcus anginosis 8/17 MRSA PCR - negative 8/18 Bcx: NGTD   9/17, PharmD, [facility] PGY1 Pharmacy Resident 09/09/2021 2:09 PM

## 2021-09-09 NOTE — NC FL2 (Signed)
[place] MEDICAID FL2 LEVEL OF CARE SCREENING TOOL     IDENTIFICATION  Patient Name: Anthony [name] Birthdate: [birth year] Sex: male Admission Date (Current Location): 09/03/2021  [facility] and [place] Number:  Producer, television/film/video and Address:  [facility] [place], 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    Endocarditis of mitral valve    Hypokalemia    Sepsis (HCC)    Osteomyelitis (HCC)    Pyogenic arthritis of left hip (HCC)    Septic arthritis of hip (HCC) 09/03/2021   Prolonged Q-T interval on ECG 09/03/2021   Pressure injury of skin of left buttock 05/13/2021   PVCs (premature ventricular contractions)    Atypical chest pain 01/04/2020   Slow transit constipation 03/06/2017   Neurogenic bladder 03/06/2017   Suprapubic catheter (HCC) 03/06/2017   Paraplegia (HCC) 01/29/2017   Pressure injury of skin 01/27/2017   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/24/2017 01/24/2017   Status post debridement 01/24/2017   Hyperlipidemia 03/11/2014   Spina bifida aperta of lumbar spine (HCC) 09/30/2012   Routine adult health maintenance 03/26/2012   ALLERGIC RHINITIS 06/01/2007   ANXIETY 03/19/2006   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   VENOUS INSUFFICIENCY, CHRONIC 03/19/2006   Pressure ulcer 03/19/2006    Orientation RESPIRATION BLADDER Height & Weight     Self, Time, Situation, Place  Normal Incontinent, Indwelling catheter Weight: 188 lb 0.8 oz (85.3  kg) Height:  5\' 8"  (172.7 cm)  BEHAVIORAL SYMPTOMS/MOOD NEUROLOGICAL BOWEL NUTRITION STATUS      Incontinent Diet (see d/c summary)  AMBULATORY STATUS COMMUNICATION OF NEEDS Skin   Extensive Assist Verbally PU Stage and Appropriate Care (Pressure Injury stage 3, buttocks; pressure injury stage 4 ischeal tuberosity)                       Personal Care Assistance Level of Assistance  Bathing, Feeding, Dressing Bathing Assistance: Limited assistance Feeding assistance: Independent Dressing Assistance: Limited assistance     Functional Limitations Info  Sight, Hearing, Speech Sight Info: Impaired Hearing Info: Adequate Speech Info: Adequate    SPECIAL CARE FACTORS FREQUENCY  PT (By licensed PT), OT (By licensed OT)     PT Frequency: 5x/week OT Frequency: 5x/week            Contractures Contractures Info: Not present    Additional Factors Info  Code Status, Allergies Code Status Info: Full code Allergies Info: Vancomycin           Current Medications (09/09/2021):  This is the current hospital active medication list Current Facility-Administered Medications  Medication Dose Route Frequency Provider Last Rate Last Admin   0.9 %  sodium chloride infusion  250 mL Intravenous Continuous Minor, 09/11/2021, NP 10 mL/hr at 09/09/21 1114 Restarted at 09/09/21 1147   0.9 %  sodium chloride infusion   Intravenous Continuous Espinoza, Alejandra, DO 120 mL/hr at 09/09/21 1313 New Bag at 09/09/21 1313   acetaminophen (TYLENOL)  tablet 650 mg  650 mg Oral Q6H PRN Eliezer Champagne, NP   650 mg at 09/08/21 1512   cefTRIAXone (ROCEPHIN) 2 g in sodium chloride 0.9 % 100 mL IVPB  2 g Intravenous Q24H Vu, Trung T, MD 200 mL/hr at 09/09/21 0045 2 g at 09/09/21 0045   Chlorhexidine Gluconate Cloth 2 % PADS 6 each  6 each Topical Daily Luciano Cutter, MD   6 each at 09/09/21 0900   DAPTOmycin (CUBICIN) 850 mg in sodium chloride 0.9 % IVPB  10 mg/kg Intravenous Q2000 Vu, Trung T, MD        enoxaparin (LOVENOX) injection 40 mg  40 mg Subcutaneous Daily Eliezer Champagne, NP   40 mg at 09/09/21 0900   famotidine (PEPCID) tablet 10 mg  10 mg Oral Daily PRN Tiffany Kocher, MD   10 mg at 09/04/21 1513   flecainide (TAMBOCOR) tablet 50 mg  50 mg Oral BID Littie Deeds, MD   50 mg at 09/09/21 0900   ibuprofen (ADVIL) tablet 600 mg  600 mg Oral Q6H PRN Alicia Amel, MD   600 mg at 09/09/21 0859   lip balm (CARMEX) ointment   Topical PRN Luciano Cutter, MD       loratadine (CLARITIN) tablet 10 mg  10 mg Oral Daily PRN Moses Manners, MD   10 mg at 09/07/21 0658   LORazepam (ATIVAN) tablet 1 mg  1 mg Oral Once PRN Alicia Amel, MD       metroNIDAZOLE (FLAGYL) tablet 500 mg  500 mg Oral Q12H Leander Rams, RPH   500 mg at 09/09/21 6644   Oral care mouth rinse  15 mL Mouth Rinse PRN Leslye Peer, MD       prochlorperazine (COMPAZINE) tablet 5 mg  5 mg Oral Q6H PRN Tiffany Kocher, MD   5 mg at 09/08/21 0347   rosuvastatin (CRESTOR) tablet 40 mg  40 mg Oral Daily Tiffany Kocher, MD   40 mg at 09/08/21 2116   sodium hypochlorite (DAKIN'S 1/4 STRENGTH) topical solution   Irrigation BID Nestor Ramp, MD   Given at 09/09/21 0900     Discharge Medications: Please see discharge summary for a list of discharge medications.  Relevant Imaging Results:  Relevant Lab Results:   Additional Information ssn: [id number]  [facility], LCSW

## 2021-09-09 NOTE — Progress Notes (Signed)
Daily Progress Note Intern Pager: [phone]  Patient name: Anthony [name] Medical record number: [id number] Date of birth: 25-Aug-1970 Age: 51 y.o. Gender: male  Primary Care Provider: Erick Alley, DO Consultants: PCCM (s/o s/p ICU stay), ID, ortho Code Status: Full   Pt Overview and Major Events to Date:  8/15: Admitted for sepsis 8/16: Septic shock, transfered to ICU. Started on pressors 8/18: Off pressors, transferred back to FMTS  Assessment and Plan:  CLEMENTS TORO is a 51 y.o. male with PMHx of paraplegia from spina bifida who was admitted for sepsis likely from L hip septic arthritis. He briefly spent time in the ICU on pressors due to progression of septic shock, now stabilized though persistently febrile, receiving IV antibiotics.  * Septic arthritis of hip (HCC) Patient remained afebrile overnight. Continued on ceftriaxone, metronidazole, and daptomycin. Per ID, abx regimen to last for 10 - 14 days, may switch to oral doxy/augmentin on discharge. BCx from 8/15 grew pan-sensitive Strep anginosis. Repeat blood cultures show no growth <24 hours. Note also Urine culture with VRE. TTE without signs of vegetation, will need to undergo [name]. Given persistent fever despite 5 days of therapy that should be effective, concerned for inadequate source control.  - MRI of the hip yesterday showed worsening abscesses and concern for osteomyelitis in the acetabulum. - [name] planned for today - per ID recs of 10 - 14 days for total abx regimen, end date is 8/26-30 (start date 8/17) - Tylenol 650 mg q6h PRN for mild pain, fever; monitor fever curve - Continue abx regimen: ceftriaxone, metronidazole, and daptomycin - consider switching to oral doxy/augmentin on discharge  Abx timeline:  CTX: 8/15, 8/17- Daptomycin: 8/16- IV flagyl: 8/16-8/18 PO flagyl: 8/18-  Pressure ulcer Chronic stage IV pressure ulcer on left buttock, stage III and stage II ulcers present in sacral region.   Additional wound on dorsum of right foot noted. Wound care consulted, topical wound care orders in place. No further needs from WOC at this time, bedside nursing to perform wound care. - Wound care per bedside nursing  Prolonged Q-T interval on ECG On flecainide for PVC, per cardiology. Curbsided cardiology who advised to monitor QTc on telemetry, difficult to read on his EKG. On tele this AM QTc 460s. - Monitor QTc on tele -- Optimize electrolytes K>4, Mg >2   FEN/GI: Heart healthy, n.p.o. at midnight 8/20 for [name] PPx: Lovenox Dispo:Home with home health. Barriers include [name], IV antibiotics, Ortho intervention.   Subjective:  Patient resting comfortably.  No acute events overnight.  Patient reports is the best he has felt in many days.  Objective: Temp:  [97.7 F (36.5 C)-101 F (38.3 C)] 98.1 F (36.7 C) (08/21 0440) Pulse Rate:  [86-102] 86 (08/21 0440) Resp:  [18-22] 21 (08/21 0440) BP: (96-131)/(66-72) 111/69 (08/21 0440) SpO2:  [91 %-99 %] 99 % (08/21 0440) Weight:  [83.7 kg-85.3 kg] 85.3 kg (08/21 0440) Physical Exam: General: Chronically ill-appearing, no acute distress Cardiovascular: Regular rate and rhythm, no murmurs appreciated on exam Respiratory: Clear, no crackles appreciated.  No increased work of breathing Abdomen: Soft, nontender Extremities: No peripheral edema.  Laboratory: Most recent CBC Lab Results  Component Value Date   WBC 11.5 (H) 09/07/2021   HGB 9.5 (L) 09/07/2021   HCT 29.9 (L) 09/07/2021   MCV 81.9 09/07/2021   PLT 279 09/07/2021   Most recent BMP    Latest Ref Rng & Units 09/08/2021    4:42 AM  BMP  Glucose 70 - 99 mg/dL 97   BUN 6 - 20 mg/dL 9   Creatinine 6.37 - 8.58 mg/dL 8.50   Sodium 277 - 412 mmol/L 136   Potassium 3.5 - 5.1 mmol/L 4.6   Chloride 98 - 111 mmol/L 107   CO2 22 - 32 mmol/L 22   Calcium 8.9 - 10.3 mg/dL 7.9    Imaging/Diagnostic Tests: No new tests.   Glendale Chard, DO 09/09/2021, 7:52 AM  PGY-1, [facility]  Health Family Medicine FPTS Intern pager: [phone], text pages welcome Secure chat group [facility]

## 2021-09-09 NOTE — Progress Notes (Addendum)
Spoke with Charma Igo, PA-C. He states Dr. [name] will evaluate patient today.   Glendale Chard, DO 09/09/21

## 2021-09-09 NOTE — CV Procedure (Signed)
    PROCEDURE NOTE:  Procedure:  Transesophageal echocardiogram Operator:  [name], MD Indications:  Bacteremia Complications: None  During this procedure the patient is administered a total of Neosynephrine and Propofol 300 mg to achieve and maintain moderate conscious sedation.  The patient's heart rate, blood pressure, and oxygen saturation are monitored continuously during the procedure by anesthesia.   Results: Normal LV size and function EF 55% Normal RV size and function Normal RA Normal LA and LA appendage with no thrombus Normal TV with mild TR Normal PV with trivial PR Normal leaflet structure with a small mobile density on the ventricular side of the anterior mitral valve leaflet that likely represents vegetation.  There is mild MR. Normal trileaflet AV  Hypermobile interatrial septum with no evidence of shunt by colorflow dopper or agitated saline contrast study Normal thoracic and ascending aorta.  IMPRESSION  Probable anterior MV leaflet vegetation.  The patient tolerated the procedure well and was transferred back to their room in stable condition.  Signed: [name], MD [facility]

## 2021-09-09 NOTE — Care Management Important Message (Signed)
Important Message  Patient Details  Name: Anthony [name] MRN: [id number] Date of Birth: 03-06-1970   Medicare Important Message Given:  Yes     Renie Ora 09/09/2021, 8:07 AM

## 2021-09-09 NOTE — Progress Notes (Signed)
Occupational Therapy Treatment Patient Details Name: Anthony [name] MRN: [id number] DOB: 09-14-1970 Today's Date: 09/09/2021   History of present illness Pt is a 51 y/o male admitted secondary to sepsis likely from chronic wounds. PMH includes  spina bifida and paraplegia, neurogenic bladder with suprapubic catheter, complicated by chronic sacral wounds, chronic hip wound.   OT comments  Pt provided contact information for [employer] for new w/c and cushion. Pt with concerns that he cannot afford copay which has kept him from getting a new chair in the past. OT reached out to case manager who added SW to see if pt could qualify for Medicaid given his high medical bills. Pt will need 6 weeks of IV antibiotics and is now agreeable to SNF. He appears to be feeling much better, no c/o nausea. Supportive mom at bedside.    Recommendations for follow up therapy are one component of a multi-disciplinary discharge planning process, led by the attending physician.  Recommendations may be updated based on patient status, additional functional criteria and insurance authorization.    Follow Up Recommendations  Skilled nursing-short term rehab (<3 hours/day)    Assistance Recommended at Discharge Intermittent Supervision/Assistance  Patient can return home with the following  A little help with walking and/or transfers;A little help with bathing/dressing/bathroom;Assist for transportation   Equipment Recommendations  Wheelchair (measurements OT);Wheelchair cushion (measurements OT)    Recommendations for Other Services      Precautions / Restrictions Precautions Precautions: Fall       Mobility Bed Mobility               General bed mobility comments: pt repositioning for comfort in bed    Transfers                         Balance                                           ADL either performed or assessed with clinical judgement   ADL  Pt is able  to complete bed level grooming and self feeding with set up. Minimal appetite.                                             Extremity/Trunk Assessment              Vision       Perception     Praxis      Cognition Arousal/Alertness: Awake/alert Behavior During Therapy: WFL for tasks assessed/performed                                   General Comments: pt now agrees to SNF for IV antibiotics and wound care, case management notified        Exercises      Shoulder Instructions       General Comments      Pertinent Vitals/ Pain       Pain Assessment Pain Assessment: Faces Faces Pain Scale: Hurts little more Pain Location: wounds Pain Descriptors / Indicators: Grimacing  Home Living  Prior Functioning/Environment              Frequency  Min 2X/week        Progress Toward Goals  OT Goals(current goals can now be found in the care plan section)  Progress towards OT goals: Progressing toward goals  Acute Rehab OT Goals OT Goal Formulation: With patient Time For Goal Achievement: 09/18/21 Potential to Achieve Goals: Good  Plan Discharge plan needs to be updated    Co-evaluation                 AM-PAC OT "6 Clicks" Daily Activity     Outcome Measure   Help from another person eating meals?: None Help from another person taking care of personal grooming?: A Little Help from another person toileting, which includes using toliet, bedpan, or urinal?: A Little Help from another person bathing (including washing, rinsing, drying)?: A Little Help from another person to put on and taking off regular upper body clothing?: A Little Help from another person to put on and taking off regular lower body clothing?: A Little 6 Click Score: 19    End of Session    OT Visit Diagnosis: Muscle weakness (generalized) (M62.81);Pain   Activity Tolerance Patient  tolerated treatment well   Patient Left in bed;with call bell/phone within reach   Nurse Communication Other (comment) (pt has orthopedist preference)        Time: 5462-7035 OT Time Calculation (min): 19 min  Charges: OT General Charges $OT Visit: 1 Visit OT Treatments $Therapeutic Activity: 8-22 mins  Berna Spare, OTR/L Acute Rehabilitation Services Office: [phone]   Evern Bio 09/09/2021, 3:25 PM

## 2021-09-09 NOTE — Transfer of Care (Signed)
Immediate Anesthesia Transfer of Care Note  Patient: Anthony [name]  Procedure(s) Performed: TRANSESOPHAGEAL ECHOCARDIOGRAM ([name]) BUBBLE STUDY  Patient Location: PACU  Anesthesia Type:MAC  Level of Consciousness: drowsy  Airway & Oxygen Therapy: Patient Spontanous Breathing  Post-op Assessment: Report given to RN and Post -op Vital signs reviewed and stable  Post vital signs: Reviewed and stable  Last Vitals:  Vitals Value Taken Time  BP    Temp    Pulse 88 09/09/21 1146  Resp 30 09/09/21 1146  SpO2 92 % 09/09/21 1146  Vitals shown include unvalidated device data.  Last Pain:  Vitals:   09/09/21 1020  TempSrc: Temporal  PainSc: 0-No pain      Patients Stated Pain Goal: 0 (24/26/83 4196)  Complications: No notable events documented.

## 2021-09-09 NOTE — Plan of Care (Signed)
  Problem: Education: Goal: Knowledge of General Education information will improve Description: Including pain rating scale, medication(s)/side effects and non-pharmacologic comfort measures Outcome: Progressing   Problem: Health Behavior/Discharge Planning: Goal: Ability to manage health-related needs will improve Outcome: Progressing   Problem: Clinical Measurements: Goal: Respiratory complications will improve Outcome: Progressing   Problem: Clinical Measurements: Goal: Cardiovascular complication will be avoided Outcome: Progressing   Problem: Clinical Measurements: Goal: Diagnostic test results will improve Outcome: Progressing   Problem: Nutrition: Goal: Adequate nutrition will be maintained Outcome: Progressing   Problem: Activity: Goal: Risk for activity intolerance will decrease Outcome: Progressing   Problem: Pain Managment: Goal: General experience of comfort will improve Outcome: Progressing   Problem: Safety: Goal: Ability to remain free from injury will improve Outcome: Progressing   Problem: Skin Integrity: Goal: Risk for impaired skin integrity will decrease Outcome: Progressing

## 2021-09-09 NOTE — Anesthesia Procedure Notes (Signed)
Procedure Name: MAC Date/Time: 09/09/2021 11:24 AM  Performed by: Erick Colace, CRNAPre-anesthesia Checklist: Patient identified, Emergency Drugs available, Suction available, Patient being monitored and Timeout performed Patient Re-evaluated:Patient Re-evaluated prior to induction Oxygen Delivery Method: Nasal cannula

## 2021-09-10 DIAGNOSIS — E876 Hypokalemia: Secondary | ICD-10-CM | POA: Diagnosis not present

## 2021-09-10 DIAGNOSIS — E43 Unspecified severe protein-calorie malnutrition: Secondary | ICD-10-CM

## 2021-09-10 DIAGNOSIS — M009 Pyogenic arthritis, unspecified: Secondary | ICD-10-CM | POA: Diagnosis not present

## 2021-09-10 DIAGNOSIS — I059 Rheumatic mitral valve disease, unspecified: Secondary | ICD-10-CM | POA: Diagnosis not present

## 2021-09-10 DIAGNOSIS — I330 Acute and subacute infective endocarditis: Secondary | ICD-10-CM | POA: Diagnosis not present

## 2021-09-10 DIAGNOSIS — G8220 Paraplegia, unspecified: Secondary | ICD-10-CM

## 2021-09-10 DIAGNOSIS — M869 Osteomyelitis, unspecified: Secondary | ICD-10-CM | POA: Diagnosis not present

## 2021-09-10 DIAGNOSIS — A419 Sepsis, unspecified organism: Secondary | ICD-10-CM | POA: Diagnosis not present

## 2021-09-10 DIAGNOSIS — R9431 Abnormal electrocardiogram [ECG] [EKG]: Secondary | ICD-10-CM | POA: Diagnosis not present

## 2021-09-10 DIAGNOSIS — Q057 Lumbar spina bifida without hydrocephalus: Secondary | ICD-10-CM | POA: Diagnosis not present

## 2021-09-10 DIAGNOSIS — R7881 Bacteremia: Secondary | ICD-10-CM | POA: Diagnosis not present

## 2021-09-10 LAB — URINE CULTURE: Culture: >=100000 — AB

## 2021-09-10 LAB — MRSA NEXT GEN BY PCR, NASAL: MRSA by PCR Next Gen: NOT DETECTED

## 2021-09-10 MED ORDER — SODIUM CHLORIDE 0.9 % IV SOLN
3.0000 g | Freq: Four times a day (QID) | INTRAVENOUS | Status: DC
  Administered 2021-09-10 – 2021-09-12 (×7): 3 g via INTRAVENOUS
  Filled 2021-09-10 (×7): qty 8

## 2021-09-10 MED ORDER — ENOXAPARIN SODIUM 40 MG/0.4ML IJ SOSY
40.0000 mg | PREFILLED_SYRINGE | Freq: Every day | INTRAMUSCULAR | Status: DC
Start: 2021-09-12 — End: 2021-09-14
  Administered 2021-09-12: 40 mg via SUBCUTANEOUS
  Filled 2021-09-10: qty 0.4

## 2021-09-10 MED ORDER — DOXYCYCLINE HYCLATE 100 MG PO TABS
100.0000 mg | ORAL_TABLET | Freq: Two times a day (BID) | ORAL | Status: DC
Start: 2021-09-10 — End: 2021-09-16
  Administered 2021-09-10 – 2021-09-15 (×12): 100 mg via ORAL
  Filled 2021-09-10 (×12): qty 1

## 2021-09-10 NOTE — H&P (View-Only) (Signed)
ORTHOPAEDIC CONSULTATION  REQUESTING PHYSICIAN: Moses Manners, MD  Chief Complaint: Purulent abscess left hip.  HPI: Anthony [name] is a 51 y.o. male who presents with a several month history of purulent drainage from his left hip.  Patient is status post multiple surgical interventions with multiple incisions for contracture of the left hip.  These are remote surgeries.  Patient states he has noticed purulent drainage for several months.  Past Medical History:  Diagnosis Date   Chronic indwelling Foley catheter    PVCs (premature ventricular contractions)    Spina bifida    Wheelchair bound    Past Surgical History:  Procedure Laterality Date   BACK SURGERY     HIP SURGERY     INCISION AND DRAINAGE OF WOUND N/A 01/24/2017   Procedure: IRRIGATION AND DEBRIDEMENT WOUND- BUTTOCK ABSCESS;  Surgeon: Almond Lint, MD;  Location: [facility];  Service: General;  Laterality: N/A;   Social History   Socioeconomic History   Marital status: Single    Spouse name: Not on file   Number of children: Not on file   Years of education: Not on file   Highest education level: Not on file  Occupational History   Not on file  Tobacco Use   Smoking status: Never   Smokeless tobacco: Never  Vaping Use   Vaping Use: Never used  Substance and Sexual Activity   Alcohol use: No   Drug use: Not on file   Sexual activity: Not on file  Other Topics Concern   Not on file  Social History Narrative   Not on file   Social Determinants of Health   Financial Resource Strain: Not on file  Food Insecurity: Not on file  Transportation Needs: Not on file  Physical Activity: Not on file  Stress: Not on file  Social Connections: Not on file   Family History  Problem Relation Age of Onset   CAD Father        died age 42 from MI   Sudden Cardiac Death Sister        age 47   CAD Maternal Grandfather        died age 29 MI   - negative except otherwise stated in the family history  section Allergies  Allergen Reactions   Vancomycin Itching   Prior to Admission medications   Medication Sig Start Date End Date Taking? Authorizing Provider  flecainide (TAMBOCOR) 50 MG tablet Take 1 tablet (50 mg total) by mouth 2 (two) times daily. 05/20/21  Yes Camnitz, Will Daphine Deutscher, MD  lisinopril-hydrochlorothiazide (ZESTORETIC) 20-25 MG tablet Take 1 tablet by mouth once daily 06/27/21  Yes Turner, Cornelious Bryant, MD  metoprolol succinate (TOPROL-XL) 50 MG 24 hr tablet Take 1 tablet (50 mg total) by mouth 2 (two) times daily. Take with or immediately following a meal. 04/26/21  Yes Turner, Cornelious Bryant, MD  Multiple Vitamin (MULTIVITAMIN WITH MINERALS) TABS tablet Take 1 tablet by mouth daily.   Yes [provider]  Omega-3 Fatty Acids (FISH OIL) 1000 MG CAPS Take 1 capsule by mouth 2 (two) times daily.   Yes [provider]  Potassium 99 MG TABS Take 1 tablet by mouth 2 (two) times daily.   Yes [provider]  rosuvastatin (CRESTOR) 40 MG tablet Take 1 tablet (40 mg total) by mouth daily. 04/29/21  Yes Quintella Reichert, MD  Catheters (FOLEY CATHETER 2-WAY) MISC Use as directed by urologist. 12/30/12   Leona Singleton, MD  CT ABDOMEN PELVIS W CONTRAST  Result Date: 09/09/2021 CLINICAL DATA:  Sepsis EXAM: CT ABDOMEN AND PELVIS WITH CONTRAST TECHNIQUE: Multidetector CT imaging of the abdomen and pelvis was performed using the standard protocol following bolus administration of intravenous contrast. RADIATION DOSE REDUCTION: This exam was performed according to the departmental dose-optimization program which includes automated exposure control, adjustment of the mA and/or kV according to patient size and/or use of iterative reconstruction technique. CONTRAST:  OMNIPAQUE IOHEXOL 300 MG/ML  SOLN COMPARISON:  09/03/2021 FINDINGS: Lower chest: Trace bilateral pleural effusions. Bibasilar atelectasis. Heart is normal size. Hepatobiliary: Gallbladder is contracted. No focal  hepatic abnormality or biliary ductal dilatation. Pancreas: No focal abnormality or ductal dilatation. Spleen: No focal abnormality.  Normal size. Adrenals/Urinary Tract: Suprapubic catheter in place with bladder decompressed. Multiple large bladder stones again noted, unchanged. No hydronephrosis. No renal or adrenal mass. Stomach/Bowel: Normal appendix. Stomach, large and small bowel grossly unremarkable. Vascular/Lymphatic: No evidence of aneurysm or adenopathy. Reproductive: No visible focal abnormality. Other: No free fluid or free air. Musculoskeletal: Large decubitus ulcer in the left hip/buttock region, unchanged. Severe hip dysplasia and advanced degenerative changes and bone destruction seen in the hips bilaterally. There is gas and fluid noted in the soft tissues surrounding the left hip as well as within the left hip joint. Bone destruction seen within the pubic rami bilaterally, likely chronic osteomyelitis. Findings are stable since recent study. IMPRESSION: Large decubitus ulcer in the region of the left buttock, unchanged. Extensive fluid and gas noted in the soft tissue surrounding the left hip joint and in the left buttock as well as within the left hip joint. Findings concerning for cellulitis and possible septic arthritis. Chronic osteomyelitis changes seen within the pubic rami bilaterally, unchanged. Multiple large bladder stones, unchanged. Trace bilateral pleural effusions.  Bibasilar atelectasis. Electronically Signed   By: Charlett Nose M.D.   On: 09/09/2021 22:21   ECHO [name]  Result Date: 09/09/2021    TRANSESOPHOGEAL ECHO REPORT   Patient Name:   Anthony [name] Date of Exam: 09/09/2021 Medical Rec #:  [id number]      Height:       68.0 in Accession #:    [id number]     Weight:       188.1 lb Date of Birth:  [birth year]     BSA:          1.991 m Patient Age:    50 years       BP:           110/71 mmHg Patient Gender: M              HR:           91 bpm. Exam Location:  Inpatient Procedure:  3D Echo, Transesophageal Echo, Cardiac Doppler and Color Doppler Indications:     Bacteremia  History:         Patient has prior history of Echocardiogram examinations, most                  recent 09/06/2021. Abnormal ECG, Signs/Symptoms:Bacteremia and                  Chest Pain; Risk Factors:Dyslipidemia.  Sonographer:     Sheralyn Boatman RDCS Referring Phys:  Corrin Parker Diagnosing Phys: [name] MD PROCEDURE: After discussion of the risks and benefits of a [name], an informed consent was obtained from the patient. The transesophogeal probe was passed without difficulty through the esophogus of  the patient. Imaged were obtained with the patient in a left lateral decubitus position. Sedation performed by different physician. The patient was monitored while under deep sedation. Anesthestetic sedation was provided intravenously by Anesthesiology: 300mg of Propofol. The patient's vital signs; including heart rate, blood pressure, and oxygen saturation; remained stable throughout the procedure. The patient developed no complications during the procedure. IMPRESSIONS  1. Left ventricular ejection fraction, by estimation, is 60 to 65%. The left ventricle has normal function. The left ventricle has no regional wall motion abnormalities.  2. Right ventricular systolic function is normal. The right ventricular size is normal.  3. Abnormal mitral valve with a small mobile density on the ventricular side of the anterior mitral valve leaflet. In the setting of bacteremia, cannot rule out vegetation. Mild mitral valve regurgitation. No evidence of mitral stenosis.  4. The aortic valve is normal in structure. Aortic valve regurgitation is trivial. No aortic stenosis is present.  5. The inferior vena cava is normal in size with greater than 50% respiratory variability, suggesting right atrial pressure of 3 mmHg.  6. No left atrial/left atrial appendage thrombus was detected. Conclusion(s)/Recommendation(s): Suspicious for  possible vegetation on the anterior mitral valve leaflet. FINDINGS  Left Ventricle: Left ventricular ejection fraction, by estimation, is 60 to 65%. The left ventricle has normal function. The left ventricle has no regional wall motion abnormalities. The left ventricular internal cavity size was normal in size. There is  no left ventricular hypertrophy. Right Ventricle: The right ventricular size is normal. No increase in right ventricular wall thickness. Right ventricular systolic function is normal. Left Atrium: Left atrial size was normal in size. No left atrial/left atrial appendage thrombus was detected. Right Atrium: Right atrial size was normal in size. Pericardium: There is no evidence of pericardial effusion. Mitral Valve: Abnormal mitral valve with possible small density on the ventricular side of the anterior mitral valve leaflet. In the setting of bacteremia, cannot rule out vegetation. The mitral valve is abnormal. Mild mitral valve regurgitation. There is a unknown size present in the mitral position. No evidence of mitral valve stenosis. Tricuspid Valve: The tricuspid valve is normal in structure. Tricuspid valve regurgitation is mild . No evidence of tricuspid stenosis. Aortic Valve: The aortic valve is normal in structure. Aortic valve regurgitation is trivial. No aortic stenosis is present. Pulmonic Valve: The pulmonic valve was normal in structure. Pulmonic valve regurgitation is trivial. No evidence of pulmonic stenosis. Aorta: The aortic root is normal in size and structure. Venous: The inferior vena cava is normal in size with greater than 50% respiratory variability, suggesting right atrial pressure of 3 mmHg. IAS/Shunts: There is redundancy of the interatrial septum. The interatrial septum appears to be lipomatous. No atrial level shunt detected by color flow Doppler. Traci Turner MD Electronically signed by Traci Turner MD Signature Date/Time: 09/09/2021/8:36:43 PM  mit    Final    MR HIP  LEFT W WO CONTRAST  Result Date: 09/08/2021 CLINICAL DATA:  Concern for septic arthritis. EXAM: MRI OF THE LEFT HIP WITHOUT AND WITH CONTRAST TECHNIQUE: Multiplanar, multisequence MR imaging was performed both before and after administration of intravenous contrast. CONTRAST:  8.4mL GADAVIST GADOBUTROL 1 MMOL/ML IV SOLN COMPARISON:  None Available. FINDINGS: Bones/Joint/Cartilage No acute fracture. Dysplastic left hip with shallow left acetabulum and superolateral subluxation of the femoral head relative to the acetabulum. Small left hip joint effusion. Extensive full-thickness cartilage loss of the left femoral head and acetabulum. Soft tissue wound overlying the left ischial tuberosity with   surrounding severe soft tissue edema. Complex multiloculated fluid collection with air in the fluid overlying the posterolateral aspect of the greater trochanter and extending into the proximal thigh measuring 6.8 x 4.3 x 8.3 cm consistent with an abscess. Second complex fluid collection extends medial to the proximal femur along the lesser trochanter measuring 2.6 x 8.4 x 2.8 cm. Mild marrow edema in the posterior left acetabulum concerning for osteomyelitis. Chronic right hip dysplasia with heterotopic ossification and possible prior Girdlestone procedure. Muscles and Tendons Muscle edema in the left adductor musculature and to lesser extent right adductor musculature which may reflect neurogenic edema versus infectious myositis. Muscles are normal. Soft tissue Soft tissue wound overlying the right ischial tuberosity. IMPRESSION: 1. Soft tissue wound overlying the left ischial tuberosity with surrounding severe cellulitis. Complex multiloculated fluid collection with air in the fluid overlying the posterolateral aspect of the greater trochanter and extending into the proximal thigh measuring 6.8 x 4.3 x 8.3 cm consistent with an abscess. Second complex fluid collection extends medial to the proximal femur along the lesser  trochanter measuring 2.6 x 8.4 x 2.8 cm. Mild marrow edema in the posterior left acetabulum concerning for osteomyelitis. 2. Muscle edema in the left adductor musculature and to lesser extent right adductor musculature which may reflect neurogenic edema versus infectious myositis. Electronically Signed   By: Elige Ko M.D.   On: 09/08/2021 12:32   - pertinent xrays, CT, MRI studies were reviewed and independently interpreted  Positive ROS: All other systems have been reviewed and were otherwise negative with the exception of those mentioned in the HPI and as above.  Physical Exam: General: Alert, no acute distress Psychiatric: Patient is competent for consent with normal mood and affect Lymphatic: No axillary or cervical lymphadenopathy Cardiovascular: No pedal edema Respiratory: No cyanosis, no use of accessory musculature GI: No organomegaly, abdomen is soft and non-tender    Images:  @ENCIMAGES @  Labs:  Lab Results  Component Value Date   REPTSTATUS PENDING 09/06/2021   REPTSTATUS PENDING 09/06/2021   GRAMSTAIN  01/24/2017    ABUNDANT WBC PRESENT, PREDOMINANTLY PMN ABUNDANT GRAM NEGATIVE RODS GRAM POSITIVE COCCI Gram Stain Report Called to,Read Back By and Verified With: DR.[email or web address],F [id number] [email or web address]    CULT  09/06/2021    NO GROWTH 4 DAYS Performed at [facility], [address] [place], [place]    CULT  09/06/2021    NO GROWTH 4 DAYS Performed at [facility], [address], [place], [place]    [facility] STREPTOCOCCUS ANGINOSIS 09/03/2021    Lab Results  Component Value Date   ALBUMIN <1.5 (L) 09/07/2021   ALBUMIN <1.5 (L) 09/06/2021   ALBUMIN <1.5 (L) 09/04/2021        Latest Ref Rng & Units 09/09/2021    7:46 AM 09/07/2021    3:32 AM 09/05/2021    6:09 AM  CBC EXTENDED  WBC 4.0 - 10.5 K/uL 14.9  11.5  10.8   RBC 4.22 - 5.81 MIL/uL 3.80  3.65  3.39   Hemoglobin 13.0 - 17.0 g/dL 9.8  9.5  8.8   HCT 09/07/2021 - 52.0 % 31.2   29.9  27.9   Platelets 150 - 400 K/uL 348  279  229   NEUT# 1.7 - 7.7 K/uL  9.1    Lymph# 0.7 - 4.0 K/uL  1.1      Neurologic: Patient does not have protective sensation bilateral lower extremities.   MUSCULOSKELETAL:   Skin:  Examination there is no cellulitis but patient does have sacral decubitus ulceration as well as purulent drainage from an ulcer over the lateral left hip.  There is a previous lateral hip incision and a previous anterior hip incision.  Review of the CT scan and MRI scan shows chronic destructive changes of the proximal femur as well as the acetabulum with air in the soft tissue.  White cell count has slowly increased to 14.9 hemoglobin stable at 9.8.  Albumin less than 1.5.  Assessment: Assessment: Septic arthritis left hip with osteomyelitis of the acetabulum and proximal femur with severe protein caloric malnutrition.  Plan: Plan: We will plan for Girdlestone amputation of the proximal femur debridement of the large abscess.  This may require a staged procedure with further debridement on Friday.  Risks and benefits were discussed including persistent infection in the acetabulum.  Patient states he understands and wishes to proceed at this time.  Thank you for the consult and the opportunity to see Mr. Azarel Banner, MD [facility] [phone] 8:49 AM

## 2021-09-10 NOTE — Progress Notes (Signed)
PT Cancellation Note  Patient Details Name: Anthony [name] MRN: [id number] DOB: October 28, 1970   Cancelled Treatment:    Reason Eval/Treat Not Completed: Other (comment) (nauseous , worse with mobility). Plans for sx tomorrow. Please reorder therapy when appropriate after surgery.   Lyanne Co, PT  Acute Rehab Services Secure chat preferred Office [phone]    Elyse Hsu 09/10/2021, 3:18 PM

## 2021-09-10 NOTE — Consult Note (Signed)
ORTHOPAEDIC CONSULTATION  REQUESTING PHYSICIAN: Anthony Manners, MD  Chief Complaint: Purulent abscess left hip.  HPI: Anthony [name] is a 51 y.o. male who presents with a several month history of purulent drainage from his left hip.  Patient is status post multiple surgical interventions with multiple incisions for contracture of the left hip.  These are remote surgeries.  Patient states he has noticed purulent drainage for several months.  Past Medical History:  Diagnosis Date   Chronic indwelling Foley catheter    PVCs (premature ventricular contractions)    Spina bifida    Wheelchair bound    Past Surgical History:  Procedure Laterality Date   BACK SURGERY     HIP SURGERY     INCISION AND DRAINAGE OF WOUND N/A 01/24/2017   Procedure: IRRIGATION AND DEBRIDEMENT WOUND- BUTTOCK ABSCESS;  Surgeon: Almond Lint, MD;  Location: [facility];  Service: General;  Laterality: N/A;   Social History   Socioeconomic History   Marital status: Single    Spouse name: Not on file   Number of children: Not on file   Years of education: Not on file   Highest education level: Not on file  Occupational History   Not on file  Tobacco Use   Smoking status: Never   Smokeless tobacco: Never  Vaping Use   Vaping Use: Never used  Substance and Sexual Activity   Alcohol use: No   Drug use: Not on file   Sexual activity: Not on file  Other Topics Concern   Not on file  Social History Narrative   Not on file   Social Determinants of Health   Financial Resource Strain: Not on file  Food Insecurity: Not on file  Transportation Needs: Not on file  Physical Activity: Not on file  Stress: Not on file  Social Connections: Not on file   Family History  Problem Relation Age of Onset   CAD Father        died age 42 from MI   Sudden Cardiac Death Sister        age 47   CAD Maternal Grandfather        died age 29 MI   - negative except otherwise stated in the family history  section Allergies  Allergen Reactions   Vancomycin Itching   Prior to Admission medications   Medication Sig Start Date End Date Taking? Authorizing Provider  flecainide (TAMBOCOR) 50 MG tablet Take 1 tablet (50 mg total) by mouth 2 (two) times daily. 05/20/21  Yes Camnitz, Will Daphine Deutscher, MD  lisinopril-hydrochlorothiazide (ZESTORETIC) 20-25 MG tablet Take 1 tablet by mouth once daily 06/27/21  Yes Turner, Cornelious Bryant, MD  metoprolol succinate (TOPROL-XL) 50 MG 24 hr tablet Take 1 tablet (50 mg total) by mouth 2 (two) times daily. Take with or immediately following a meal. 04/26/21  Yes Turner, Cornelious Bryant, MD  Multiple Vitamin (MULTIVITAMIN WITH MINERALS) TABS tablet Take 1 tablet by mouth daily.   Yes [provider]  Omega-3 Fatty Acids (FISH OIL) 1000 MG CAPS Take 1 capsule by mouth 2 (two) times daily.   Yes [provider]  Potassium 99 MG TABS Take 1 tablet by mouth 2 (two) times daily.   Yes [provider]  rosuvastatin (CRESTOR) 40 MG tablet Take 1 tablet (40 mg total) by mouth daily. 04/29/21  Yes Quintella Reichert, MD  Catheters (FOLEY CATHETER 2-WAY) MISC Use as directed by urologist. 12/30/12   Leona Singleton, MD  CT ABDOMEN PELVIS W CONTRAST  Result Date: 09/09/2021 CLINICAL DATA:  Sepsis EXAM: CT ABDOMEN AND PELVIS WITH CONTRAST TECHNIQUE: Multidetector CT imaging of the abdomen and pelvis was performed using the standard protocol following bolus administration of intravenous contrast. RADIATION DOSE REDUCTION: This exam was performed according to the departmental dose-optimization program which includes automated exposure control, adjustment of the mA and/or kV according to patient size and/or use of iterative reconstruction technique. CONTRAST:  OMNIPAQUE IOHEXOL 300 MG/ML  SOLN COMPARISON:  09/03/2021 FINDINGS: Lower chest: Trace bilateral pleural effusions. Bibasilar atelectasis. Heart is normal size. Hepatobiliary: Gallbladder is contracted. No focal  hepatic abnormality or biliary ductal dilatation. Pancreas: No focal abnormality or ductal dilatation. Spleen: No focal abnormality.  Normal size. Adrenals/Urinary Tract: Suprapubic catheter in place with bladder decompressed. Multiple large bladder stones again noted, unchanged. No hydronephrosis. No renal or adrenal mass. Stomach/Bowel: Normal appendix. Stomach, large and small bowel grossly unremarkable. Vascular/Lymphatic: No evidence of aneurysm or adenopathy. Reproductive: No visible focal abnormality. Other: No free fluid or free air. Musculoskeletal: Large decubitus ulcer in the left hip/buttock region, unchanged. Severe hip dysplasia and advanced degenerative changes and bone destruction seen in the hips bilaterally. There is gas and fluid noted in the soft tissues surrounding the left hip as well as within the left hip joint. Bone destruction seen within the pubic rami bilaterally, likely chronic osteomyelitis. Findings are stable since recent study. IMPRESSION: Large decubitus ulcer in the region of the left buttock, unchanged. Extensive fluid and gas noted in the soft tissue surrounding the left hip joint and in the left buttock as well as within the left hip joint. Findings concerning for cellulitis and possible septic arthritis. Chronic osteomyelitis changes seen within the pubic rami bilaterally, unchanged. Multiple large bladder stones, unchanged. Trace bilateral pleural effusions.  Bibasilar atelectasis. Electronically Signed   By: Charlett Nose M.D.   On: 09/09/2021 22:21   ECHO [name]  Result Date: 09/09/2021    TRANSESOPHOGEAL ECHO REPORT   Patient Name:   Anthony [name] Date of Exam: 09/09/2021 Medical Rec #:  [id number]      Height:       68.0 in Accession #:    [id number]     Weight:       188.1 lb Date of Birth:  [birth year]     BSA:          1.991 m Patient Age:    50 years       BP:           110/71 mmHg Patient Gender: M              HR:           91 bpm. Exam Location:  Inpatient Procedure:  3D Echo, Transesophageal Echo, Cardiac Doppler and Color Doppler Indications:     Bacteremia  History:         Patient has prior history of Echocardiogram examinations, most                  recent 09/06/2021. Abnormal ECG, Signs/Symptoms:Bacteremia and                  Chest Pain; Risk Factors:Dyslipidemia.  Sonographer:     Sheralyn Boatman RDCS Referring Phys:  Corrin Parker Diagnosing Phys: [name] MD PROCEDURE: After discussion of the risks and benefits of a [name], an informed consent was obtained from the patient. The transesophogeal probe was passed without difficulty through the esophogus of  the patient. Imaged were obtained with the patient in a left lateral decubitus position. Sedation performed by different physician. The patient was monitored while under deep sedation. Anesthestetic sedation was provided intravenously by Anesthesiology: 300mg  of Propofol. The patient's vital signs; including heart rate, blood pressure, and oxygen saturation; remained stable throughout the procedure. The patient developed no complications during the procedure. IMPRESSIONS  1. Left ventricular ejection fraction, by estimation, is 60 to 65%. The left ventricle has normal function. The left ventricle has no regional wall motion abnormalities.  2. Right ventricular systolic function is normal. The right ventricular size is normal.  3. Abnormal mitral valve with a small mobile density on the ventricular side of the anterior mitral valve leaflet. In the setting of bacteremia, cannot rule out vegetation. Mild mitral valve regurgitation. No evidence of mitral stenosis.  4. The aortic valve is normal in structure. Aortic valve regurgitation is trivial. No aortic stenosis is present.  5. The inferior vena cava is normal in size with greater than 50% respiratory variability, suggesting right atrial pressure of 3 mmHg.  6. No left atrial/left atrial appendage thrombus was detected. Conclusion(s)/Recommendation(s): Suspicious for  possible vegetation on the anterior mitral valve leaflet. FINDINGS  Left Ventricle: Left ventricular ejection fraction, by estimation, is 60 to 65%. The left ventricle has normal function. The left ventricle has no regional wall motion abnormalities. The left ventricular internal cavity size was normal in size. There is  no left ventricular hypertrophy. Right Ventricle: The right ventricular size is normal. No increase in right ventricular wall thickness. Right ventricular systolic function is normal. Left Atrium: Left atrial size was normal in size. No left atrial/left atrial appendage thrombus was detected. Right Atrium: Right atrial size was normal in size. Pericardium: There is no evidence of pericardial effusion. Mitral Valve: Abnormal mitral valve with possible small density on the ventricular side of the anterior mitral valve leaflet. In the setting of bacteremia, cannot rule out vegetation. The mitral valve is abnormal. Mild mitral valve regurgitation. There is a unknown size present in the mitral position. No evidence of mitral valve stenosis. Tricuspid Valve: The tricuspid valve is normal in structure. Tricuspid valve regurgitation is mild . No evidence of tricuspid stenosis. Aortic Valve: The aortic valve is normal in structure. Aortic valve regurgitation is trivial. No aortic stenosis is present. Pulmonic Valve: The pulmonic valve was normal in structure. Pulmonic valve regurgitation is trivial. No evidence of pulmonic stenosis. Aorta: The aortic root is normal in size and structure. Venous: The inferior vena cava is normal in size with greater than 50% respiratory variability, suggesting right atrial pressure of 3 mmHg. IAS/Shunts: There is redundancy of the interatrial septum. The interatrial septum appears to be lipomatous. No atrial level shunt detected by color flow Doppler. Armanda Magicraci Turner MD Electronically signed by Armanda Magicraci Turner MD Signature Date/Time: 09/09/2021/8:36:43 PM  mit    Final    MR HIP  LEFT W WO CONTRAST  Result Date: 09/08/2021 CLINICAL DATA:  Concern for septic arthritis. EXAM: MRI OF THE LEFT HIP WITHOUT AND WITH CONTRAST TECHNIQUE: Multiplanar, multisequence MR imaging was performed both before and after administration of intravenous contrast. CONTRAST:  8.144mL GADAVIST GADOBUTROL 1 MMOL/ML IV SOLN COMPARISON:  None Available. FINDINGS: Bones/Joint/Cartilage No acute fracture. Dysplastic left hip with shallow left acetabulum and superolateral subluxation of the femoral head relative to the acetabulum. Small left hip joint effusion. Extensive full-thickness cartilage loss of the left femoral head and acetabulum. Soft tissue wound overlying the left ischial tuberosity with  surrounding severe soft tissue edema. Complex multiloculated fluid collection with air in the fluid overlying the posterolateral aspect of the greater trochanter and extending into the proximal thigh measuring 6.8 x 4.3 x 8.3 cm consistent with an abscess. Second complex fluid collection extends medial to the proximal femur along the lesser trochanter measuring 2.6 x 8.4 x 2.8 cm. Mild marrow edema in the posterior left acetabulum concerning for osteomyelitis. Chronic right hip dysplasia with heterotopic ossification and possible prior Girdlestone procedure. Muscles and Tendons Muscle edema in the left adductor musculature and to lesser extent right adductor musculature which may reflect neurogenic edema versus infectious myositis. Muscles are normal. Soft tissue Soft tissue wound overlying the right ischial tuberosity. IMPRESSION: 1. Soft tissue wound overlying the left ischial tuberosity with surrounding severe cellulitis. Complex multiloculated fluid collection with air in the fluid overlying the posterolateral aspect of the greater trochanter and extending into the proximal thigh measuring 6.8 x 4.3 x 8.3 cm consistent with an abscess. Second complex fluid collection extends medial to the proximal femur along the lesser  trochanter measuring 2.6 x 8.4 x 2.8 cm. Mild marrow edema in the posterior left acetabulum concerning for osteomyelitis. 2. Muscle edema in the left adductor musculature and to lesser extent right adductor musculature which may reflect neurogenic edema versus infectious myositis. Electronically Signed   By: Elige Ko M.D.   On: 09/08/2021 12:32   - pertinent xrays, CT, MRI studies were reviewed and independently interpreted  Positive ROS: All other systems have been reviewed and were otherwise negative with the exception of those mentioned in the HPI and as above.  Physical Exam: General: Alert, no acute distress Psychiatric: Patient is competent for consent with normal mood and affect Lymphatic: No axillary or cervical lymphadenopathy Cardiovascular: No pedal edema Respiratory: No cyanosis, no use of accessory musculature GI: No organomegaly, abdomen is soft and non-tender    Images:  @ENCIMAGES @  Labs:  Lab Results  Component Value Date   REPTSTATUS PENDING 09/06/2021   REPTSTATUS PENDING 09/06/2021   GRAMSTAIN  01/24/2017    ABUNDANT WBC PRESENT, PREDOMINANTLY PMN ABUNDANT GRAM NEGATIVE RODS GRAM POSITIVE COCCI Gram Stain Report Called to,Read Back By and Verified With: DR.BYERLY,F [id number] A.QUIZON    CULT  09/06/2021    NO GROWTH 4 DAYS Performed at [facility], [address] [place], [place]    CULT  09/06/2021    NO GROWTH 4 DAYS Performed at [facility], [address], [place], [place]    [facility] STREPTOCOCCUS ANGINOSIS 09/03/2021    Lab Results  Component Value Date   ALBUMIN <1.5 (L) 09/07/2021   ALBUMIN <1.5 (L) 09/06/2021   ALBUMIN <1.5 (L) 09/04/2021        Latest Ref Rng & Units 09/09/2021    7:46 AM 09/07/2021    3:32 AM 09/05/2021    6:09 AM  CBC EXTENDED  WBC 4.0 - 10.5 K/uL 14.9  11.5  10.8   RBC 4.22 - 5.81 MIL/uL 3.80  3.65  3.39   Hemoglobin 13.0 - 17.0 g/dL 9.8  9.5  8.8   HCT 09/07/2021 - 52.0 % 31.2   29.9  27.9   Platelets 150 - 400 K/uL 348  279  229   NEUT# 1.7 - 7.7 K/uL  9.1    Lymph# 0.7 - 4.0 K/uL  1.1      Neurologic: Patient does not have protective sensation bilateral lower extremities.   MUSCULOSKELETAL:   Skin:  Examination there is no cellulitis but patient does have sacral decubitus ulceration as well as purulent drainage from an ulcer over the lateral left hip.  There is a previous lateral hip incision and a previous anterior hip incision.  Review of the CT scan and MRI scan shows chronic destructive changes of the proximal femur as well as the acetabulum with air in the soft tissue.  White cell count has slowly increased to 14.9 hemoglobin stable at 9.8.  Albumin less than 1.5.  Assessment: Assessment: Septic arthritis left hip with osteomyelitis of the acetabulum and proximal femur with severe protein caloric malnutrition.  Plan: Plan: We will plan for Girdlestone amputation of the proximal femur debridement of the large abscess.  This may require a staged procedure with further debridement on Friday.  Risks and benefits were discussed including persistent infection in the acetabulum.  Patient states he understands and wishes to proceed at this time.  Thank you for the consult and the opportunity to see Mr. Azarel Banner, MD [facility] [phone] 8:49 AM

## 2021-09-10 NOTE — Progress Notes (Signed)
Daily Progress Note Intern Pager: [phone]  Patient name: Anthony [name] Medical record number: [id number] Date of birth: 1970-05-18 Age: 51 y.o. Gender: male  Primary Care Provider: Erick Alley, DO Consultants: PCCM (s/o s/p ICU stay), ID, ortho Code Status: Full  Pt Overview and Major Events to Date:  8/15: Admitted for sepsis 8/16: Septic shock, transfered to ICU. Started on pressors 8/18: Off pressors, transferred back to [facility]MTS  Assessment and Plan:  JONTAY MASTON is a 51 y.o. male with PMHx of paraplegia from spina bifida who was admitted for sepsis likely from L hip septic arthritis. He briefly spent time in the ICU on pressors due to progression of septic shock, now stabilized though persistently febrile, receiving IV antibiotics.  * Septic arthritis of hip (HCC) Patient became febrile overnight, temperature 101.2. Continued on antibiotics. Per ID, abx regimen to last for 10 - 14 days, may switch to oral doxy/augmentin on discharge.  With new vegetations observed on [name] will now need 6 weeks IV antibiotics. - BCx from 8/15 grew pan-sensitive Strep anginosis. Repeat blood cultures show no growth <24 hours. Note also Urine culture with VRE. - TTE without signs of vegetation, [name] showed vegetation on the mitral valve. - MRI of the hip showed worsening abscesses and concern for osteomyelitis in the acetabulum. - CT abdomen pelvis 8/21: No intra-abdominal abscesses observed - Per ID recs of 10 - 14 days for total abx regimen, end date is 8/26-30 (start date 8/17) - Tylenol 650 mg q6h PRN for mild pain, fever; monitor fever curve - Dr. [name] with ortho saw patient is preparing surgical intervention for later this week.   Abx timeline:  CTX: 8/15, 8/17- Daptomycin: 8/16- IV flagyl: 8/16-8/18 PO flagyl: 8/18-  Pressure ulcer Chronic stage IV pressure ulcer on left buttock, stage III and stage II ulcers present in sacral region.  Additional wound on dorsum of right foot  noted. Wound care consulted, topical wound care orders in place. No further needs from WOC at this time, bedside nursing to perform wound care. - Wound care per bedside nursing  Prolonged Q-T interval on ECG On flecainide for PVC, per cardiology. Curbsided cardiology who advised to monitor QTc on telemetry, difficult to read on his EKG. On tele this AM QTc 460s. - Monitor QTc on tele -- Optimize electrolytes K>4, Mg >2 - Electrolytes have been stable.    FEN/GI: Heart healthy, currently n.p.o. pending Ortho intervention PPx: Lovenox Dispo:Home with home health in 3 or more days. Barriers include IV antibiotics, Ortho intervention.   Subjective:  Patient resting comfortably, no acute events overnight.  I had a conversation with him regarding his severity of disease and necessity of surgical intervention.  He has no new concerns this morning.   Objective: Temp:  [98 F (36.7 C)-101.2 F (38.4 C)] 98 F (36.7 C) (08/22 0746) Pulse Rate:  [85-104] 88 (08/22 0746) Resp:  [15-30] 17 (08/22 0746) BP: (84-126)/(44-67) 108/67 (08/22 0746) SpO2:  [92 %-98 %] 98 % (08/22 0746) Weight:  [87.2 kg] 87.2 kg (08/22 0551) Physical Exam: General: Chronically ill-appearing, no acute distress Cardiovascular: Regular rate and rhythm, no murmurs Respiratory: Clear, no crackles, no consolidations Abdomen: Nondistended, nontender Extremities: No peripheral edema  Laboratory: Most recent CBC Lab Results  Component Value Date   WBC 14.9 (H) 09/09/2021   HGB 9.8 (L) 09/09/2021   HCT 31.2 (L) 09/09/2021   MCV 82.1 09/09/2021   PLT 348 09/09/2021   Most recent BMP  Latest Ref Rng & Units 09/09/2021    7:46 AM  BMP  Glucose 70 - 99 mg/dL 95   BUN 6 - 20 mg/dL 10   Creatinine 7.74 - 1.24 mg/dL 1.28   Sodium 786 - 767 mmol/L 140   Potassium 3.5 - 5.1 mmol/L 4.2   Chloride 98 - 111 mmol/L 107   CO2 22 - 32 mmol/L 25   Calcium 8.9 - 10.3 mg/dL 8.0    Imaging/Diagnostic Tests: CT abdomen and  pelvis with contrast Radiologist Impression: Large decubitus ulcer in the region of the left buttock, unchanged.  Extensive fluid and gas noted in the soft tissue surrounding the left hip joint in the left buttock as well as within the left hip joint.  Concerning for cellulitis and possible septic arthritis. My interpretation: CT unchanged from previous MRI and CT.   [place] Chard, DO 09/10/2021, 11:05 AM  PGY-1, [facility] FPTS Intern pager: [phone], text pages welcome Secure chat group [facility] Teaching Service

## 2021-09-10 NOTE — Progress Notes (Signed)
[facility]  Date of Admission:  09/03/2021     Abx: 8/16-c dapto 8/16-c ceftriaxone      8/16-c metronidazole                                                     Assessment: 51 yo spina bifida with partial paraplegia, hypospadia/urethro-cutaneous fistula, chronic suprapubic catheter, bilateral hip dysplasia, hx ischiopubic abscess 2019 s/p debridement, chronic draining left hip sinus tract, admitted 8/16 with septic shock in setting several days increased left  hip discharge  W/u thus far Admission bcx 1 set done. Strep angi. Repeat bcx negative.  8/20 mri hip confirmed septic arthritis with associated periarticular abscesses [name] 8/21 showed mv vegetation    ------------------- 8/22 assessment Ongoing fever I suspect the hip process. He has vre in urine which is sensitive to ampicillin  Strep angi bacteremia. [name] suggestive of mv endocarditis. Ct abd/pelv without intraabd abscess  Dr [name] had evaluated patient for the left hip septic arthritis/abscesses and planned I&D with girdlestone procedure this week  Once surgery is done I would recommend 2 weeks hip coverage as I do believe he is at risk for recurrent disease regardless of long/short course. He will need however 6 weeks abx treatment for endocarditis.    If a second stage hip arthroplasty is planned, will probably need to cover the hip infection though for 6 weeks     Plan: Transition antibiotics to amp-sulbactam; would continue doxycycline for possible mrsa in hip as well Await left hip I&D Please send culture of hip to make sure no esbl present Further recommendation pending ct abd pelv Discussed with primary team  I spent more than 50 minute reviewing data/chart, and coordinating care and >50% direct face to face time providing counseling/discussing diagnostics/treatment plan with patient    Principal Problem:   Septic arthritis of hip (HCC) Active Problems:   Pressure  ulcer   Prolonged Q-T interval on ECG   Osteomyelitis (HCC)   Pyogenic arthritis of left hip (HCC)   Hypokalemia   Sepsis (HCC)   Bacteremia   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   Endocarditis of mitral valve   Allergies  Allergen Reactions   Vancomycin Itching    Scheduled Meds:  Chlorhexidine Gluconate Cloth  6 each Topical Daily   doxycycline  100 mg Oral Q12H   enoxaparin (LOVENOX) injection  40 mg Subcutaneous Daily   flecainide  50 mg Oral BID   rosuvastatin  40 mg Oral Daily   sodium hypochlorite   Irrigation BID   Continuous Infusions:  sodium chloride 10 mL/hr at 09/09/21 1114   ampicillin-sulbactam (UNASYN) IV 3 g (09/10/21 1200)   PRN Meds:.acetaminophen, famotidine, ibuprofen, lip balm, loratadine, mouth rinse, prochlorperazine   SUBJECTIVE: Repeat bcx negative Mri done over weekend confirmed left hip joint abscesses  Continues to fever through weekend   [name] today showed possible mv vegetation    Review of Systems: ROS All other ROS was negative, except mentioned above     OBJECTIVE: Vitals:   09/10/21 0551 09/10/21 0746 09/10/21 1152 09/10/21 1154  BP: 112/63 108/67  123/67  Pulse: 87 88  98  Resp: 17 17  (!) 22  Temp: 98.5 F (36.9 C) 98 F (36.7 C) 98.8 F (37.1 C)  TempSrc: Oral Oral Oral   SpO2: 98% 98%  98%  Weight: 87.2 kg     Height:       Body mass index is 29.23 kg/m.  Physical Exam General/constitutional: no distress, pleasant HEENT: Normocephalic, PER, Conj Clear, EOMI, Oropharynx clear Neck supple CV: rrr no mrg Lungs: clear to auscultation, normal respiratory effort Abd: Soft, Nontender Ext: no edema Neuro/msk: stable paraplegia    Lab Results Lab Results  Component Value Date   WBC 14.9 (H) 09/09/2021   HGB 9.8 (L) 09/09/2021   HCT 31.2 (L) 09/09/2021   MCV 82.1 09/09/2021   PLT 348 09/09/2021    Lab Results  Component Value Date   CREATININE 0.69 09/09/2021   BUN 10 09/09/2021    NA 140 09/09/2021   K 4.2 09/09/2021   CL 107 09/09/2021   CO2 25 09/09/2021    Lab Results  Component Value Date   ALT 40 09/07/2021   AST 38 09/07/2021   ALKPHOS 310 (H) 09/07/2021   BILITOT 0.4 09/07/2021      Microbiology: Recent Results (from the past 240 hour(s))  Resp Panel by RT-PCR (Flu A&B, Covid) Anterior Nasal Swab     Status: None   Collection Time: 09/03/21  3:36 PM   Specimen: Anterior Nasal Swab  Result Value Ref Range Status   SARS Coronavirus 2 by RT PCR NEGATIVE NEGATIVE Final    Comment: (NOTE) SARS-CoV-2 target nucleic acids are NOT DETECTED.  The SARS-CoV-2 RNA is generally detectable in upper respiratory specimens during the acute phase of infection. The lowest concentration of SARS-CoV-2 viral copies this assay can detect is 138 copies/mL. A negative result does not preclude SARS-Cov-2 infection and should not be used as the sole basis for treatment or other patient management decisions. A negative result may occur with  improper specimen collection/handling, submission of specimen other than nasopharyngeal swab, presence of viral mutation(s) within the areas targeted by this assay, and inadequate number of viral copies(<138 copies/mL). A negative result must be combined with clinical observations, patient history, and epidemiological information. The expected result is Negative.  Fact Sheet for Patients:  BloggerCourse.comhttps://www.fda.gov/media/152166/download  Fact Sheet for Healthcare Providers:  SeriousBroker.ithttps://www.fda.gov/media/152162/download  This test is no t yet approved or cleared by the [place] FDA and  has been authorized for detection and/or diagnosis of SARS-CoV-2 by FDA under an Emergency Use Authorization (EUA). This EUA will remain  in effect (meaning this test can be used) for the duration of the COVID-19 declaration under Section 564(b)(1) of the Act, 21 U.S.C.section 360bbb-3(b)(1), unless the authorization is terminated  or revoked  sooner.       Influenza A by PCR NEGATIVE NEGATIVE Final   Influenza B by PCR NEGATIVE NEGATIVE Final    Comment: (NOTE) The Xpert Xpress SARS-CoV-2/FLU/RSV plus assay is intended as an aid in the diagnosis of influenza from Nasopharyngeal swab specimens and should not be used as a sole basis for treatment. Nasal washings and aspirates are unacceptable for Xpert Xpress SARS-CoV-2/FLU/RSV testing.  Fact Sheet for Patients: BloggerCourse.comhttps://www.fda.gov/media/152166/download  Fact Sheet for Healthcare Providers: SeriousBroker.ithttps://www.fda.gov/media/152162/download  This test is not yet approved or cleared by the [place] FDA and has been authorized for detection and/or diagnosis of SARS-CoV-2 by FDA under an Emergency Use Authorization (EUA). This EUA will remain in effect (meaning this test can be used) for the duration of the COVID-19 declaration under Section 564(b)(1) of the Act, 21 U.S.C. section 360bbb-3(b)(1), unless the authorization is terminated or revoked.  Performed  at [facility], [address] [place], [place] [address]   Urine Culture     Status: Abnormal   Collection Time: 09/03/21  3:36 PM   Specimen: In/Out Cath Urine  Result Value Ref Range Status   Specimen Description IN/OUT CATH URINE  Final   Special Requests NONE  Final   Culture (A)  Final    >=100,000 COLONIES/mL VANCOMYCIN RESISTANT ENTEROCOCCUS   Report Status 09/05/2021 FINAL  Final   Organism ID, Bacteria VANCOMYCIN RESISTANT ENTEROCOCCUS (A)  Final      Susceptibility   Vancomycin resistant enterococcus - MIC*    AMPICILLIN <=2 SENSITIVE Sensitive     NITROFURANTOIN <=16 SENSITIVE Sensitive     VANCOMYCIN >=32 RESISTANT Resistant     PENICILLIN Value in next row Sensitive      SENSITIVE MIC 4 SPerformed at [facility], [address], [place], [place] [address]    * >=100,000 COLONIES/mL VANCOMYCIN RESISTANT ENTEROCOCCUS  Blood Culture (routine x 2)     Status: Abnormal   Collection Time:  09/03/21  4:18 PM   Specimen: BLOOD  Result Value Ref Range Status   Specimen Description BLOOD SITE NOT SPECIFIED  Final   Special Requests   Final    BOTTLES DRAWN AEROBIC AND ANAEROBIC Blood Culture adequate volume   Culture  Setup Time   Final    GRAM POSITIVE COCCI IN CHAINS ANAEROBIC BOTTLE ONLY CRITICAL RESULT CALLED TO, READ BACK BY AND VERIFIED WITH: SYDNEY DAVIS [id number] AT 1138 BY EC Performed at [facility], [address] [place], [place] [address]    Culture STREPTOCOCCUS ANGINOSIS (A)  Final   Report Status 09/08/2021 FINAL  Final   Organism ID, Bacteria STREPTOCOCCUS ANGINOSIS  Final      Susceptibility   Streptococcus anginosis - MIC*    PENICILLIN <=0.06 SENSITIVE Sensitive     CEFTRIAXONE 0.5 SENSITIVE Sensitive     ERYTHROMYCIN <=0.12 SENSITIVE Sensitive     LEVOFLOXACIN 0.5 SENSITIVE Sensitive     VANCOMYCIN 0.5 SENSITIVE Sensitive     * STREPTOCOCCUS ANGINOSIS  Blood Culture ID Panel (Reflexed)     Status: Abnormal   Collection Time: 09/03/21  4:18 PM  Result Value Ref Range Status   Enterococcus faecalis NOT DETECTED NOT DETECTED Final   Enterococcus Faecium NOT DETECTED NOT DETECTED Final   Listeria monocytogenes NOT DETECTED NOT DETECTED Final   Staphylococcus species NOT DETECTED NOT DETECTED Final   Staphylococcus aureus (BCID) NOT DETECTED NOT DETECTED Final   Staphylococcus epidermidis NOT DETECTED NOT DETECTED Final   Staphylococcus lugdunensis NOT DETECTED NOT DETECTED Final   Streptococcus species DETECTED (A) NOT DETECTED Final    Comment: Not Enterococcus species, Streptococcus agalactiae, Streptococcus pyogenes, or Streptococcus pneumoniae. CRITICAL RESULT CALLED TO, READ BACK BY AND VERIFIED WITH: PHARMD SYDNEY DAVIS [phone] AT 1138 BY EC    Streptococcus agalactiae NOT DETECTED NOT DETECTED Final   Streptococcus pneumoniae NOT DETECTED NOT DETECTED Final   Streptococcus pyogenes NOT DETECTED NOT DETECTED Final    A.calcoaceticus-baumannii NOT DETECTED NOT DETECTED Final   Bacteroides fragilis NOT DETECTED NOT DETECTED Final   Enterobacterales NOT DETECTED NOT DETECTED Final   Enterobacter cloacae complex NOT DETECTED NOT DETECTED Final   Escherichia coli NOT DETECTED NOT DETECTED Final   Klebsiella aerogenes NOT DETECTED NOT DETECTED Final   Klebsiella oxytoca NOT DETECTED NOT DETECTED Final   Klebsiella pneumoniae NOT DETECTED NOT DETECTED Final   Proteus species NOT DETECTED NOT DETECTED  Final   Salmonella species NOT DETECTED NOT DETECTED Final   Serratia marcescens NOT DETECTED NOT DETECTED Final   Haemophilus influenzae NOT DETECTED NOT DETECTED Final   Neisseria meningitidis NOT DETECTED NOT DETECTED Final   Pseudomonas aeruginosa NOT DETECTED NOT DETECTED Final   Stenotrophomonas maltophilia NOT DETECTED NOT DETECTED Final   Candida albicans NOT DETECTED NOT DETECTED Final   Candida auris NOT DETECTED NOT DETECTED Final   Candida glabrata NOT DETECTED NOT DETECTED Final   Candida krusei NOT DETECTED NOT DETECTED Final   Candida parapsilosis NOT DETECTED NOT DETECTED Final   Candida tropicalis NOT DETECTED NOT DETECTED Final   Cryptococcus neoformans/gattii NOT DETECTED NOT DETECTED Final    Comment: Performed at [facility], [address], [place], [place] [address]  MRSA Next Gen by PCR, Nasal     Status: None   Collection Time: 09/05/21  1:20 AM   Specimen: Nasal Mucosa; Nasal Swab  Result Value Ref Range Status   MRSA by PCR Next Gen NOT DETECTED NOT DETECTED Final    Comment: (NOTE) The GeneXpert MRSA Assay (FDA approved for NASAL specimens only), is one component of a comprehensive MRSA colonization surveillance program. It is not intended to diagnose MRSA infection nor to guide or monitor treatment for MRSA infections. Test performance is not FDA approved in patients less than 2 years old. Performed at [facility], [address] [place], [place] [address]    Culture, blood (Routine X 2) w Reflex to ID Panel     Status: None (Preliminary result)   Collection Time: 09/06/21  1:29 PM   Specimen: BLOOD  Result Value Ref Range Status   Specimen Description BLOOD RIGHT ANTECUBITAL  Final   Special Requests IN PEDIATRIC BOTTLE Blood Culture adequate volume  Final   Culture   Final    NO GROWTH 4 DAYS Performed at [facility], [address], [place], [place] [address]    Report Status PENDING  Incomplete  Culture, blood (Routine X 2) w Reflex to ID Panel     Status: None (Preliminary result)   Collection Time: 09/06/21  1:29 PM   Specimen: BLOOD RIGHT HAND  Result Value Ref Range Status   Specimen Description BLOOD RIGHT HAND  Final   Special Requests IN PEDIATRIC BOTTLE Blood Culture adequate volume  Final   Culture   Final    NO GROWTH 4 DAYS Performed at [facility], [address] [place], [place] [address]    Report Status PENDING  Incomplete     Serology:   Imaging: If present, new imagings (plain films, ct scans, and mri) have been personally visualized and interpreted; radiology reports have been reviewed. Decision making incorporated into the Impression / Recommendations.  8/20 mri left hip 1. Soft tissue wound overlying the left ischial tuberosity with surrounding severe cellulitis. Complex multiloculated fluid collection with air in the fluid overlying the posterolateral aspect of the greater trochanter and extending into the proximal thigh measuring 6.8 x 4.3 x 8.3 cm consistent with an abscess. Second complex fluid collection extends medial to the proximal femur along the lesser trochanter measuring 2.6 x 8.4 x 2.8 cm. Mild marrow edema in the posterior left acetabulum concerning for osteomyelitis. 2. Muscle edema in the left adductor musculature and to lesser extent right adductor musculature which may reflect neurogenic edema versus infectious myositis.  8/15 abd pelv ct with contrast Multiple large  bladder stones are noted. Urinary bladder is  decompressed secondary to Foley catheter.   Continued presence of large decubitus ulcer seen involving the left buttocks which is not significantly changed compared to prior exam. It is seen to extend to the base of the penis.   Stable chronic changes are seen involving both hip joints consistent with hip dysplasia and heterotopic bone formation. However, there is now noted air within the left hip joint concerning for possible septic arthritis.    Raymondo Band, MD [facility] [phone] pager    09/10/2021, 2:47 PM

## 2021-09-11 ENCOUNTER — Encounter (HOSPITAL_COMMUNITY): Payer: Self-pay | Admitting: Cardiology

## 2021-09-11 ENCOUNTER — Encounter (HOSPITAL_COMMUNITY)
Admission: EM | Disposition: A | Discharge status: Skilled Nursing Facility | Payer: Self-pay | Source: Home / Self Care | Attending: Family Medicine

## 2021-09-11 ENCOUNTER — Inpatient Hospital Stay (HOSPITAL_COMMUNITY): Payer: Medicare Other

## 2021-09-11 DIAGNOSIS — I739 Peripheral vascular disease, unspecified: Secondary | ICD-10-CM | POA: Diagnosis not present

## 2021-09-11 DIAGNOSIS — F419 Anxiety disorder, unspecified: Secondary | ICD-10-CM

## 2021-09-11 DIAGNOSIS — G8220 Paraplegia, unspecified: Secondary | ICD-10-CM | POA: Diagnosis not present

## 2021-09-11 DIAGNOSIS — M869 Osteomyelitis, unspecified: Secondary | ICD-10-CM | POA: Diagnosis not present

## 2021-09-11 DIAGNOSIS — I10 Essential (primary) hypertension: Secondary | ICD-10-CM

## 2021-09-11 DIAGNOSIS — M009 Pyogenic arthritis, unspecified: Secondary | ICD-10-CM

## 2021-09-11 DIAGNOSIS — Q057 Lumbar spina bifida without hydrocephalus: Secondary | ICD-10-CM | POA: Diagnosis not present

## 2021-09-11 DIAGNOSIS — E876 Hypokalemia: Secondary | ICD-10-CM | POA: Diagnosis not present

## 2021-09-11 DIAGNOSIS — I059 Rheumatic mitral valve disease, unspecified: Secondary | ICD-10-CM | POA: Diagnosis not present

## 2021-09-11 HISTORY — PX: I & D EXTREMITY: SHX5045

## 2021-09-11 HISTORY — PX: APPLICATION OF WOUND VAC: SHX5189

## 2021-09-11 LAB — CULTURE, BLOOD (ROUTINE X 2)
Culture: NO GROWTH
Culture: NO GROWTH
Special Requests: ADEQUATE
Special Requests: ADEQUATE

## 2021-09-11 SURGERY — IRRIGATION AND DEBRIDEMENT EXTREMITY
Anesthesia: General | Site: Hip | Laterality: Left

## 2021-09-11 MED ORDER — FENTANYL CITRATE (PF) 100 MCG/2ML IJ SOLN
INTRAMUSCULAR | Status: AC
  Filled 2021-09-11: qty 2

## 2021-09-11 MED ORDER — ONDANSETRON HCL 4 MG/2ML IJ SOLN
INTRAMUSCULAR | Status: DC | PRN
  Administered 2021-09-11: 4 mg via INTRAVENOUS

## 2021-09-11 MED ORDER — PHENYLEPHRINE 80 MCG/ML (10ML) SYRINGE FOR IV PUSH (FOR BLOOD PRESSURE SUPPORT)
PREFILLED_SYRINGE | INTRAVENOUS | Status: DC | PRN
  Administered 2021-09-11 (×2): 160 ug via INTRAVENOUS

## 2021-09-11 MED ORDER — ORAL CARE MOUTH RINSE
15.0000 mL | Freq: Once | OROMUCOSAL | Status: AC
Start: 2021-09-11 — End: 2021-09-11

## 2021-09-11 MED ORDER — OXYCODONE HCL 5 MG/5ML PO SOLN: 5.0000 mg | Freq: Once | ORAL | Status: DC | PRN

## 2021-09-11 MED ORDER — POVIDONE-IODINE 10 % EX SWAB: 2.0000 | Freq: Once | CUTANEOUS | Status: DC

## 2021-09-11 MED ORDER — ROCURONIUM BROMIDE 10 MG/ML (PF) SYRINGE
PREFILLED_SYRINGE | INTRAVENOUS | Status: DC | PRN
  Administered 2021-09-11: 80 mg via INTRAVENOUS

## 2021-09-11 MED ORDER — EPHEDRINE SULFATE-NACL 50-0.9 MG/10ML-% IV SOSY
PREFILLED_SYRINGE | INTRAVENOUS | Status: DC | PRN
  Administered 2021-09-11 (×2): 5 mg via INTRAVENOUS

## 2021-09-11 MED ORDER — ACETAMINOPHEN 160 MG/5ML PO SOLN: 325.0000 mg | ORAL | Status: DC | PRN

## 2021-09-11 MED ORDER — FENTANYL CITRATE (PF) 100 MCG/2ML IJ SOLN
25.0000 ug | INTRAMUSCULAR | Status: DC | PRN
  Administered 2021-09-11 (×3): 25 ug via INTRAVENOUS

## 2021-09-11 MED ORDER — DEXAMETHASONE SODIUM PHOSPHATE 10 MG/ML IJ SOLN
INTRAMUSCULAR | Status: DC | PRN
  Administered 2021-09-11: 4 mg via INTRAVENOUS

## 2021-09-11 MED ORDER — ONDANSETRON HCL 4 MG/2ML IJ SOLN
INTRAMUSCULAR | Status: AC
  Filled 2021-09-11: qty 2

## 2021-09-11 MED ORDER — CEFAZOLIN SODIUM-DEXTROSE 2-4 GM/100ML-% IV SOLN: 2.0000 g | INTRAVENOUS | Status: DC

## 2021-09-11 MED ORDER — CHLORHEXIDINE GLUCONATE 4 % EX LIQD
60.0000 mL | Freq: Once | CUTANEOUS | Status: AC
  Administered 2021-09-11: 4 via TOPICAL
  Filled 2021-09-11: qty 60

## 2021-09-11 MED ORDER — MIDAZOLAM HCL 2 MG/2ML IJ SOLN: 2.0000 mg | Freq: Once | INTRAMUSCULAR | Status: DC

## 2021-09-11 MED ORDER — FENTANYL CITRATE (PF) 250 MCG/5ML IJ SOLN
INTRAMUSCULAR | Status: DC | PRN
  Administered 2021-09-11 (×2): 50 ug via INTRAVENOUS

## 2021-09-11 MED ORDER — LIDOCAINE 2% (20 MG/ML) 5 ML SYRINGE
INTRAMUSCULAR | Status: DC | PRN
  Administered 2021-09-11: 100 mg via INTRAVENOUS

## 2021-09-11 MED ORDER — PROPOFOL 10 MG/ML IV BOLUS
INTRAVENOUS | Status: AC
  Filled 2021-09-11: qty 20

## 2021-09-11 MED ORDER — HYDROXYZINE HCL 25 MG PO TABS
25.0000 mg | ORAL_TABLET | Freq: Once | ORAL | Status: AC
Start: 2021-09-11 — End: 2021-09-11
  Administered 2021-09-11: 25 mg via ORAL
  Filled 2021-09-11: qty 1

## 2021-09-11 MED ORDER — MIDAZOLAM HCL 2 MG/2ML IJ SOLN
INTRAMUSCULAR | Status: AC
  Filled 2021-09-11: qty 2

## 2021-09-11 MED ORDER — OXYCODONE HCL 5 MG PO TABS: 5.0000 mg | ORAL_TABLET | Freq: Once | ORAL | Status: DC | PRN

## 2021-09-11 MED ORDER — MIDAZOLAM HCL 5 MG/5ML IJ SOLN
INTRAMUSCULAR | Status: DC | PRN
  Administered 2021-09-11: 2 mg via INTRAVENOUS

## 2021-09-11 MED ORDER — CHLORHEXIDINE GLUCONATE 0.12 % MT SOLN
15.0000 mL | Freq: Once | OROMUCOSAL | Status: AC
  Administered 2021-09-11: 15 mL via OROMUCOSAL

## 2021-09-11 MED ORDER — ROCURONIUM BROMIDE 10 MG/ML (PF) SYRINGE
PREFILLED_SYRINGE | INTRAVENOUS | Status: AC
  Filled 2021-09-11: qty 10

## 2021-09-11 MED ORDER — CEFAZOLIN SODIUM-DEXTROSE 2-4 GM/100ML-% IV SOLN
2.0000 g | INTRAVENOUS | Status: DC
  Filled 2021-09-11: qty 100

## 2021-09-11 MED ORDER — FENTANYL CITRATE (PF) 250 MCG/5ML IJ SOLN
INTRAMUSCULAR | Status: AC
  Filled 2021-09-11: qty 5

## 2021-09-11 MED ORDER — LIDOCAINE 2% (20 MG/ML) 5 ML SYRINGE
INTRAMUSCULAR | Status: AC
  Filled 2021-09-11: qty 5

## 2021-09-11 MED ORDER — CHLORHEXIDINE GLUCONATE 4 % EX LIQD
60.0000 mL | Freq: Once | CUTANEOUS | Status: DC
  Filled 2021-09-11: qty 60

## 2021-09-11 MED ORDER — 0.9 % SODIUM CHLORIDE (POUR BTL) OPTIME
TOPICAL | Status: DC | PRN
  Administered 2021-09-11: 1000 mL

## 2021-09-11 MED ORDER — PHENYLEPHRINE HCL-NACL 20-0.9 MG/250ML-% IV SOLN
INTRAVENOUS | Status: DC | PRN
  Administered 2021-09-11: 50 ug/min via INTRAVENOUS

## 2021-09-11 MED ORDER — LACTATED RINGERS IV SOLN: INTRAVENOUS | Status: DC

## 2021-09-11 MED ORDER — SODIUM CHLORIDE 0.9 % IR SOLN
Status: AC | PRN
  Administered 2021-09-11: 250 mL/h

## 2021-09-11 MED ORDER — HYDROXYZINE HCL 25 MG PO TABS
25.0000 mg | ORAL_TABLET | Freq: Three times a day (TID) | ORAL | Status: DC | PRN
  Administered 2021-09-13: 25 mg via ORAL
  Filled 2021-09-11: qty 1

## 2021-09-11 MED ORDER — ACETAMINOPHEN 325 MG PO TABS: 325.0000 mg | ORAL_TABLET | ORAL | Status: DC | PRN

## 2021-09-11 MED ORDER — PROPOFOL 10 MG/ML IV BOLUS
INTRAVENOUS | Status: DC | PRN
  Administered 2021-09-11: 200 mg via INTRAVENOUS

## 2021-09-11 MED ORDER — MEPERIDINE HCL 25 MG/ML IJ SOLN: 6.2500 mg | INTRAMUSCULAR | Status: DC | PRN

## 2021-09-11 SURGICAL SUPPLY — 48 items
BAG COUNTER SPONGE SURGICOUNT (BAG) IMPLANT
BAG SPNG CNTER NS LX DISP (BAG)
BLADE SAGITTAL (BLADE) ×2
BLADE SAW THK.89X75X18XSGTL (BLADE) IMPLANT
BLADE SURG 21 STRL SS (BLADE) ×2 IMPLANT
BNDG COHESIVE 6X5 TAN STRL LF (GAUZE/BANDAGES/DRESSINGS) IMPLANT
BNDG GAUZE ELAST 4 BULKY (GAUZE/BANDAGES/DRESSINGS) ×4 IMPLANT
CANISTER WOUNDNEG PRESSURE 500 (CANNISTER) IMPLANT
CASSETTE VERAFLO VERALINK (MISCELLANEOUS) IMPLANT
CNTNR URN SCR LID CUP LEK RST (MISCELLANEOUS) IMPLANT
CONNECTOR Y WND VAC (MISCELLANEOUS) IMPLANT
CONT SPEC 4OZ STRL OR WHT (MISCELLANEOUS) ×6
COVER SURGICAL LIGHT HANDLE (MISCELLANEOUS) ×4 IMPLANT
DRAPE DERMATAC (DRAPES) IMPLANT
DRAPE INCISE IOBAN 66X45 STRL (DRAPES) IMPLANT
DRAPE STERI IOBAN 125X83 (DRAPES) IMPLANT
DRAPE U-SHAPE 47X51 STRL (DRAPES) ×2 IMPLANT
DRESSING VERAFLO CLEANS CC MED (GAUZE/BANDAGES/DRESSINGS) IMPLANT
DRSG ADAPTIC 3X8 NADH LF (GAUZE/BANDAGES/DRESSINGS) ×2 IMPLANT
DRSG VERAFLO CLEANSE CC MED (GAUZE/BANDAGES/DRESSINGS) ×4
DURAPREP 26ML APPLICATOR (WOUND CARE) ×2 IMPLANT
ELECT REM PT RETURN 9FT ADLT (ELECTROSURGICAL)
ELECTRODE REM PT RTRN 9FT ADLT (ELECTROSURGICAL) IMPLANT
GAUZE SPONGE 4X4 12PLY STRL (GAUZE/BANDAGES/DRESSINGS) ×2 IMPLANT
GLOVE BIOGEL PI IND STRL 9 (GLOVE) ×2 IMPLANT
GLOVE BIOGEL PI INDICATOR 9 (GLOVE) ×2
GLOVE SURG ORTHO 9.0 STRL STRW (GLOVE) ×2 IMPLANT
GOWN STRL REUS W/ TWL XL LVL3 (GOWN DISPOSABLE) ×4 IMPLANT
GOWN STRL REUS W/TWL XL LVL3 (GOWN DISPOSABLE) ×4
HANDPIECE INTERPULSE COAX TIP (DISPOSABLE) ×2
KIT BASIN OR (CUSTOM PROCEDURE TRAY) ×2 IMPLANT
KIT TURNOVER KIT B (KITS) ×2 IMPLANT
MANIFOLD NEPTUNE II (INSTRUMENTS) ×2 IMPLANT
NS IRRIG 1000ML POUR BTL (IV SOLUTION) ×2 IMPLANT
PACK ORTHO EXTREMITY (CUSTOM PROCEDURE TRAY) ×2 IMPLANT
PAD ARMBOARD 7.5X6 YLW CONV (MISCELLANEOUS) ×4 IMPLANT
PAD NEG PRESSURE SENSATRAC (MISCELLANEOUS) IMPLANT
SET HNDPC FAN SPRY TIP SCT (DISPOSABLE) IMPLANT
SPONGE T-LAP 18X18 ~~LOC~~+RFID (SPONGE) IMPLANT
STOCKINETTE IMPERVIOUS 9X36 MD (GAUZE/BANDAGES/DRESSINGS) IMPLANT
SUT ETHILON 2 0 PSLX (SUTURE) ×2 IMPLANT
SUT SILK 2 0 TIES 10X30 (SUTURE) IMPLANT
SWAB COLLECTION DEVICE MRSA (MISCELLANEOUS) ×2 IMPLANT
SWAB CULTURE ESWAB REG 1ML (MISCELLANEOUS) IMPLANT
TOWEL GREEN STERILE (TOWEL DISPOSABLE) ×2 IMPLANT
TUBE CONNECTING 12X1/4 (SUCTIONS) ×2 IMPLANT
WND VAC CONN Y (MISCELLANEOUS) ×2
YANKAUER SUCT BULB TIP NO VENT (SUCTIONS) ×2 IMPLANT

## 2021-09-11 NOTE — Anesthesia Preprocedure Evaluation (Addendum)
Anesthesia Evaluation  Patient identified by MRN, date of birth, ID band Patient awake    Reviewed: Allergy & Precautions, NPO status , Patient's Chart, lab work & pertinent test results  History of Anesthesia Complications (+) history of anesthetic complications  Airway Mallampati: I       Dental  (+) Poor Dentition, Missing, Chipped, Loose,    Pulmonary neg pulmonary ROS,    breath sounds clear to auscultation       Cardiovascular hypertension, Pt. on medications + Peripheral Vascular Disease   Rhythm:Regular Rate:Normal  ECHO 8/23 1. Left ventricular ejection fraction, by estimation, is 50 to 55%. The  left ventricle has low normal function. The left ventricle has no regional  wall motion abnormalities. Left ventricular diastolic parameters were  normal.  2. Right ventricular systolic function is normal. The right ventricular  size is normal. There is mildly elevated pulmonary artery systolic  pressure.  3. The mitral valve is normal in structure. No evidence of mitral valve  regurgitation.  4. The aortic valve is tricuspid. Aortic valve regurgitation is not  visualized.  5. The inferior vena cava is normal in size with <50% respiratory  variability, suggesting right atrial pressure of 8 mmHg.    Neuro/Psych Anxiety negative neurological ROS     GI/Hepatic negative GI ROS, Neg liver ROS,   Endo/Other  negative endocrine ROS  Renal/GU negative Renal ROS  negative genitourinary   Musculoskeletal  (+) Arthritis , Osteoarthritis,    Abdominal   Peds  Hematology negative hematology ROS (+)   Anesthesia Other Findings   Reproductive/Obstetrics                            Anesthesia Physical  Anesthesia Plan  ASA: 3  Anesthesia Plan: General   Post-op Pain Management:    Induction: Intravenous  PONV Risk Score and Plan: 3 and Ondansetron and Treatment may vary due to age or  medical condition  Airway Management Planned: Oral ETT and LMA  Additional Equipment: None  Intra-op Plan:   Post-operative Plan: Extubation in OR  Informed Consent: I have reviewed the patients History and Physical, chart, labs and discussed the procedure including the risks, benefits and alternatives for the proposed anesthesia with the patient or authorized representative who has indicated his/her understanding and acceptance.     Dental advisory given  Plan Discussed with: CRNA and Anesthesiologist  Anesthesia Plan Comments:         Anesthesia Quick Evaluation

## 2021-09-11 NOTE — Transfer of Care (Signed)
Immediate Anesthesia Transfer of Care Note  Patient: Anthony [name]  Procedure(s) Performed: LEFT HIP DEBRIDEMENT AND REMOVAL FEMORAL HEAD (Left: Hip) APPLICATION OF WOUND VAC (Hip)  Patient Location: PACU  Anesthesia Type:General  Level of Consciousness: drowsy and patient cooperative  Airway & Oxygen Therapy: Patient Spontanous Breathing  Post-op Assessment: Report given to RN and Post -op Vital signs reviewed and stable  Post vital signs: Reviewed and stable  Last Vitals:  Vitals Value Taken Time  BP 99/53 09/11/21 1530  Temp    Pulse 87 09/11/21 1532  Resp 28 09/11/21 1532  SpO2 92 % 09/11/21 1532  Vitals shown include unvalidated device data.  Last Pain:  Vitals:   09/11/21 1138  TempSrc: Oral  PainSc:       Patients Stated Pain Goal: 0 (09/11/21 2244)  Complications: No notable events documented.

## 2021-09-11 NOTE — Interval H&P Note (Signed)
History and Physical Interval Note:  09/11/2021 7:03 AM  Anthony [name]  has presented today for surgery, with the diagnosis of Septic Left Hip.  The various methods of treatment have been discussed with the patient and family. After consideration of risks, benefits and other options for treatment, the patient has consented to  Procedure(s): LEFT HIP DEBRIDEMENT AND REMOVAL FEMORAL HEAD (Left) as a surgical intervention.  The patient's history has been reviewed, patient examined, no change in status, stable for surgery.  I have reviewed the patient's chart and labs.  Questions were answered to the patient's satisfaction.     [name]

## 2021-09-11 NOTE — Consult Note (Signed)
   [facility] Inpatient Consult   09/11/2021  LUNDY COZART [birth year] [id number]   [facility] [[facility]]  Accountable Care Organization [ACO] Patient:    Primary Care Provider: Erick Alley, DO is with [facility], is an Embedded provider with a Care Management team and program and is listed for the [facility] follow up needs     Patient screened for length of stay hospitalization for potential [facility]  [[facility]] Care Management service needs for post hospital transition for readmission prevention needs.  Patient is for ongoing medical and surgical management.    Discussed in unit progression meeting Plan:  Continue to follow progress and disposition to assess for post hospital care management needs. Disposition and needs for post hospital is currently unknown.  For questions contact:    Charlesetta Shanks, RN BSN CCM [facility]  [phone] business mobile phone Toll free office [phone]  Fax number: [phone] [email or web address]

## 2021-09-11 NOTE — Op Note (Signed)
09/11/2021  3:36 PM  PATIENT:  Anthony [name]    PRE-OPERATIVE DIAGNOSIS:  Septic Left Hip  POST-OPERATIVE DIAGNOSIS:  Same  PROCEDURE:  LEFT HIP   REMOVAL FEMORAL HEAD,  Partial excision of the pelvis and acetabulum. Application of cleanse choice installation wound VAC with 2 sponges. Local tissue rearrangement for wound closure 20 x 10 cm. Tissue cultures sent x2   SURGEON:  [name], MD  PHYSICIAN ASSISTANT:None ANESTHESIA:   General  PREOPERATIVE INDICATIONS:  [name] is a  51 y.o. male with a diagnosis of Septic Left Hip who failed conservative measures and elected for surgical management.    The risks benefits and alternatives were discussed with the patient preoperatively including but not limited to the risks of infection, bleeding, nerve injury, cardiopulmonary complications, the need for revision surgery, among others, and the patient was willing to proceed.  OPERATIVE IMPLANTS: Cleanse choice sponges x2  @ENCIMAGES @  OPERATIVE FINDINGS: Patient had black necrotic bone of both the femoral head and acetabulum and black necrotic soft tissue with purulent drainage.  OPERATIVE PROCEDURE: Patient was brought the operating room and underwent a general anesthetic.  After adequate levels anesthesia were obtained patient was placed in the right lateral decubitus position with the left side up and the left lower extremity was prepped using DuraPrep draped into a sterile field a shower curtain was used for a drape.  An elliptical incision was made around the ulcerative tissue with a posterior lateral incision.  There was large amounts of purulent drainage necrotic soft tissue with black foul-smelling wound.  The soft tissue was sent for cultures x2.  The ischial spine acetabulum and femoral head were black and necrotic.  The femoral head was resected just distal to the lesser trochanter and the femoral head and trochanter was excised in a block of tissue.  The most distal  aspect of the bone was healthy with good bleeding.  There was black necrotic changes to the acetabulum and the ischium.  A oscillating saw was also used to partially resect the acetabulum and the ischium.  The wound was irrigated with pulsatile lavage further debridement was performed there was tunneling of the wounds including tunneling of the wound proximally to the sacrum however fluid from pulse lavage did not drain from the sacral wound.  Electrocautery was used for hemostasis after debridement the tissue was marginally viable.  The wound VAC sponges were placed x2 the wound sealed with derma tack and secured with Dermabond.  This had a good suction fit and additional drain was placed in case our installation drain would become clogged.  Patient was extubated taken the PACU in stable condition.   DISCHARGE PLANNING:  Antibiotic duration: Continue IV antibiotics per infectious disease  Weightbearing: Not applicable  Pain medication: Opioid pathway  Dressing care/ Wound VAC: Continue wound VAC with installation  Ambulatory devices: Not applicable   Plan for return to the operating room on Friday for repeat debridement.

## 2021-09-11 NOTE — Progress Notes (Signed)
Daily Progress Note Intern Pager: [phone]  Patient name: Anthony [name] Medical record number: [id number] Date of birth: 04-29-1970 Age: 51 y.o. Gender: male  Primary Care Provider: Erick Alley, DO Consultants: PCCM (s/o s/p ICU stay), ID, ortho Code Status: Full   Pt Overview and Major Events to Date:  8/15: Admitted for sepsis 8/16: Septic shock, transfered to ICU. Started on pressors 8/18: Off pressors, transferred back to FMTS  Assessment and Plan:  Anthony [name] is a 51 y.o. male with PMHx of paraplegia from spina bifida who was admitted for sepsis likely from L hip septic arthritis. He briefly spent time in the ICU on pressors due to progression of septic shock, now stabilized though persistently febrile, receiving IV antibiotics  * Septic arthritis of hip (HCC) Patient was febrile overnight, temperature 103. Continued on antibiotics.  - Awaiting wound cultures from surgery planned today. - TTE without signs of vegetation, [name] showed vegetation on the mitral valve.  6 weeks IV antibiotics - MRI of the hip showed worsening abscesses and concern for osteomyelitis in the acetabulum. - CT abdomen pelvis 8/21: No intra-abdominal abscesses observed - ID consulted, appreciate recommendations  - Ortho surgery consulted, appreciate recommendations, surgery scheduled for this afternoon - Tylenol 650 mg q6h PRN for mild pain, fever; monitor fever curve  Abx timeline:  CTX: 8/15, 8/17- Daptomycin: 8/16- IV flagyl: 8/16-8/18 PO flagyl: 8/18- Amp-sulbactam 8/22- PO doxycycline: 8/22-  Pressure ulcer Chronic stage IV pressure ulcer on left buttock, stage III and stage II ulcers present in sacral region.  Additional wound on dorsum of right foot noted. Wound care consulted, topical wound care orders in place. No further needs from WOC at this time, bedside nursing to perform wound care. - Wound care per bedside nursing  Prolonged Q-T interval on ECG On flecainide for PVC,  per cardiology. Curbsided cardiology who advised to monitor QTc on telemetry, difficult to read on his EKG. On tele this AM QTc 460s. - Monitor QTc on tele -- Optimize electrolytes K>4, Mg >2 - Electrolytes have been stable.  Anxiety Patient anxious about undergoing surgery. - Prescribed hydroxyzine 25 mg TID PRN   FEN/GI: NPO for surgery this afternoon PPx: Lovenox  Dispo: waiting PT recommendations after surgical intervention. Barriers include ongoing medical management.    Subjective:  Patient resting comfortably.  No acute events overnight, but he did spike a fever to 103.  Patient agreed to undergo surgery today.  He is anxious about having surgery.  Objective: Temp:  [98.8 F (37.1 C)-103 F (39.4 C)] 101.6 F (38.7 C) (08/23 0446) Pulse Rate:  [97-109] 97 (08/23 0446) Resp:  [18-22] 20 (08/23 0446) BP: (105-130)/(50-67) 105/50 (08/23 0446) SpO2:  [93 %-98 %] 98 % (08/23 0446) Weight:  [87.7 kg] 87.7 kg (08/23 0446) Physical Exam: General: Chronically ill-appearing, no acute distress Cardiovascular: Regular rate and rhythm, no murmurs Respiratory: No increased work of breathing, lungs clear, no crackles, no consolidations, no decreased lung sounds Abdomen: Soft and nontender Extremities: No peripheral edema  Laboratory: Most recent CBC Lab Results  Component Value Date   WBC 14.9 (H) 09/09/2021   HGB 9.8 (L) 09/09/2021   HCT 31.2 (L) 09/09/2021   MCV 82.1 09/09/2021   PLT 348 09/09/2021   Most recent BMP    Latest Ref Rng & Units 09/09/2021    7:46 AM  BMP  Glucose 70 - 99 mg/dL 95   BUN 6 - 20 mg/dL 10   Creatinine 6.28 - 1.24  mg/dL 4.17   Sodium 408 - 144 mmol/L 140   Potassium 3.5 - 5.1 mmol/L 4.2   Chloride 98 - 111 mmol/L 107   CO2 22 - 32 mmol/L 25   Calcium 8.9 - 10.3 mg/dL 8.0    Anthony Chard, DO 09/11/2021, 8:35 AM  PGY-1, [facility] FPTS Intern pager: [phone], text pages welcome Secure chat group [facility] Teaching Service

## 2021-09-11 NOTE — Assessment & Plan Note (Deleted)
Patient previously anxious about undergoing surgery. -  hydroxyzine 25 mg TID PRN

## 2021-09-11 NOTE — Anesthesia Postprocedure Evaluation (Signed)
Anesthesia Post Note  Patient: Anthony [name]  Procedure(s) Performed: LEFT HIP DEBRIDEMENT AND REMOVAL FEMORAL HEAD (Left: Hip) APPLICATION OF WOUND VAC (Hip)     Patient location during evaluation: PACU Anesthesia Type: General Level of consciousness: awake and alert Pain management: pain level controlled Vital Signs Assessment: post-procedure vital signs reviewed and stable Respiratory status: spontaneous breathing, nonlabored ventilation, respiratory function stable and patient connected to nasal cannula oxygen Cardiovascular status: blood pressure returned to baseline and stable Postop Assessment: no apparent nausea or vomiting Anesthetic complications: no   No notable events documented.  Last Vitals:  Vitals:   09/11/21 1700 09/11/21 1730  BP: (!) 119/58 108/63  Pulse: 91 91  Resp: (!) 24 (!) 22  Temp:    SpO2: 95% 95%    Last Pain:  Vitals:   09/11/21 1730  TempSrc:   PainSc: Asleep                 Keiffer Piper

## 2021-09-11 NOTE — Anesthesia Procedure Notes (Signed)
Procedure Name: Intubation Date/Time: 09/11/2021 2:12 PM  Performed by: Georgia Duff, CRNAPre-anesthesia Checklist: Patient identified, Emergency Drugs available, Suction available and Patient being monitored Patient Re-evaluated:Patient Re-evaluated prior to induction Oxygen Delivery Method: Circle System Utilized Preoxygenation: Pre-oxygenation with 100% oxygen Induction Type: IV induction Ventilation: Mask ventilation without difficulty Laryngoscope Size: Mac and 4 Grade View: Grade I Tube type: Oral Tube size: 7.5 mm Number of attempts: 1 Airway Equipment and Method: Stylet and Oral airway Placement Confirmation: ETT inserted through vocal cords under direct vision, positive ETCO2 and breath sounds checked- equal and bilateral Secured at: 25 cm Tube secured with: Tape Dental Injury: Teeth and Oropharynx as per pre-operative assessment

## 2021-09-12 ENCOUNTER — Encounter (HOSPITAL_COMMUNITY): Payer: Self-pay | Admitting: Orthopedic Surgery

## 2021-09-12 ENCOUNTER — Other Ambulatory Visit: Payer: Self-pay

## 2021-09-12 DIAGNOSIS — D649 Anemia, unspecified: Secondary | ICD-10-CM

## 2021-09-12 DIAGNOSIS — T402X5A Adverse effect of other opioids, initial encounter: Secondary | ICD-10-CM

## 2021-09-12 DIAGNOSIS — K5903 Drug induced constipation: Secondary | ICD-10-CM

## 2021-09-12 DIAGNOSIS — K5900 Constipation, unspecified: Secondary | ICD-10-CM | POA: Insufficient documentation

## 2021-09-12 DIAGNOSIS — M009 Pyogenic arthritis, unspecified: Secondary | ICD-10-CM | POA: Diagnosis not present

## 2021-09-12 DIAGNOSIS — A419 Sepsis, unspecified organism: Secondary | ICD-10-CM | POA: Diagnosis not present

## 2021-09-12 DIAGNOSIS — R9431 Abnormal electrocardiogram [ECG] [EKG]: Secondary | ICD-10-CM | POA: Diagnosis not present

## 2021-09-12 DIAGNOSIS — I330 Acute and subacute infective endocarditis: Secondary | ICD-10-CM | POA: Diagnosis not present

## 2021-09-12 DIAGNOSIS — G8220 Paraplegia, unspecified: Secondary | ICD-10-CM | POA: Diagnosis not present

## 2021-09-12 DIAGNOSIS — E876 Hypokalemia: Secondary | ICD-10-CM | POA: Diagnosis not present

## 2021-09-12 DIAGNOSIS — Q057 Lumbar spina bifida without hydrocephalus: Secondary | ICD-10-CM | POA: Diagnosis not present

## 2021-09-12 LAB — CBC
HCT: 22.9 % — ABNORMAL LOW (ref 39.0–52.0)
Hemoglobin: 7.4 g/dL — ABNORMAL LOW (ref 13.0–17.0)
MCH: 25.7 pg — ABNORMAL LOW (ref 26.0–34.0)
MCHC: 32.3 g/dL (ref 30.0–36.0)
MCV: 79.5 fL — ABNORMAL LOW (ref 80.0–100.0)
Platelets: 373 10*3/uL (ref 150–400)
RBC: 2.88 MIL/uL — ABNORMAL LOW (ref 4.22–5.81)
RDW: 15.8 % — ABNORMAL HIGH (ref 11.5–15.5)
WBC: 19.8 10*3/uL — ABNORMAL HIGH (ref 4.0–10.5)
nRBC: 0 % (ref 0.0–0.2)

## 2021-09-12 LAB — HEMOGLOBIN AND HEMATOCRIT, BLOOD
HCT: 22.5 % — ABNORMAL LOW (ref 39.0–52.0)
Hemoglobin: 7.2 g/dL — ABNORMAL LOW (ref 13.0–17.0)

## 2021-09-12 MED ORDER — POLYETHYLENE GLYCOL 3350 17 G PO PACK
17.0000 g | PACK | Freq: Every day | ORAL | Status: DC | PRN
Start: 2021-09-12 — End: 2021-09-13

## 2021-09-12 MED ORDER — SENNOSIDES-DOCUSATE SODIUM 8.6-50 MG PO TABS
1.0000 | ORAL_TABLET | Freq: Every day | ORAL | Status: DC
  Filled 2021-09-12: qty 1

## 2021-09-12 MED ORDER — SENNOSIDES-DOCUSATE SODIUM 8.6-50 MG PO TABS: 1.0000 | ORAL_TABLET | Freq: Every evening | ORAL | Status: DC | PRN

## 2021-09-12 MED ORDER — POLYETHYLENE GLYCOL 3350 17 G PO PACK
17.0000 g | PACK | Freq: Every day | ORAL | Status: DC
  Filled 2021-09-12: qty 1

## 2021-09-12 MED ORDER — ACETAMINOPHEN 500 MG PO TABS
1000.0000 mg | ORAL_TABLET | Freq: Two times a day (BID) | ORAL | Status: DC
  Administered 2021-09-12 – 2021-09-13 (×3): 1000 mg via ORAL
  Filled 2021-09-12 (×7): qty 2

## 2021-09-12 MED ORDER — PIPERACILLIN-TAZOBACTAM 3.375 G IVPB
3.3750 g | Freq: Three times a day (TID) | INTRAVENOUS | Status: DC
  Administered 2021-09-12 – 2021-09-16 (×11): 3.375 g via INTRAVENOUS
  Filled 2021-09-12 (×11): qty 50

## 2021-09-12 MED ORDER — OXYCODONE HCL 5 MG PO TABS
10.0000 mg | ORAL_TABLET | ORAL | Status: DC | PRN
  Administered 2021-09-18 – 2021-09-19 (×2): 10 mg via ORAL
  Filled 2021-09-12 (×4): qty 2

## 2021-09-12 MED ORDER — OXYCODONE HCL 5 MG PO TABS
5.0000 mg | ORAL_TABLET | ORAL | Status: DC | PRN
  Administered 2021-09-18: 5 mg via ORAL
  Filled 2021-09-12: qty 1

## 2021-09-12 NOTE — Progress Notes (Signed)
FMTS Brief Progress Note  S: feeling "good." Endorses mild pain on L hip after surgery, but otherwise he states his pain is well controlled. Denies chest pain, shortness of breath.   O: sitting comfortably in bed, in NAD. Respirations unlabored on room air. BP 117/63 (BP Location: Left Arm)   Pulse 91   Temp 99.5 F (37.5 C) (Oral)   Resp 18   Ht 5\' 8"  (1.727 m)   Wt 87.7 kg   SpO2 95%   BMI 29.40 kg/m     A/P: Patient doing well. Continue to monitor hemodynamics - Orders reviewed. Labs for AM ordered, which was adjusted as needed.   , MD 09/12/2021, 12:05 AM PGY-1, 09/14/2021 Health Family Medicine Night Resident  Please page [phone] with questions.

## 2021-09-12 NOTE — Assessment & Plan Note (Addendum)
Stable. Thus far received 3 units pRBC this hospitalization, anemia likely in the setting of acute blood loss post surgically and due to history of anemia of chronic disease at baseline. Hgb 7.8, stable from yesterday.  -holding DVT prophylaxis, continue SCDs -Monitor Hgb and clinically for bleeding, transfusion threshold 7.

## 2021-09-12 NOTE — Progress Notes (Signed)
Interim Progress Note  Went in to check on patient with Dr. Claudean Severance.  Patient reports that he is having minimal postoperative pain.  He occasionally feels discomfort but says it is nothing that needs medications.  He was advised that he does have medications ordered as needed for pain if he desires.   When asked if he had any questions or concerns regarding his management he noted that he does not feel comfortable working with PT at this time. Does not want to get out of bed until his hip operations are complete. He is not concerned about maintaining his strength at this time. He does understand the importance of therapy during hospitalization. States that he will let us know if he changes his mind and feels like he needs it- requests that PT order be d/c at this time. We will request his wishes for this.   Appreciate PT's assistance. Will reconsult when patient amenable.   Also inquired about wheelchair- will d/w TOC about this.

## 2021-09-12 NOTE — Anesthesia Preprocedure Evaluation (Signed)
Anesthesia Evaluation  Patient identified by MRN, date of birth, ID band Patient awake    Reviewed: Allergy & Precautions, NPO status , Patient's Chart, lab work & pertinent test results  History of Anesthesia Complications (+) history of anesthetic complications  Airway Mallampati: I       Dental  (+) Poor Dentition, Missing, Chipped, Loose,    Pulmonary neg pulmonary ROS,    breath sounds clear to auscultation       Cardiovascular hypertension, Pt. on medications + Peripheral Vascular Disease   Rhythm:Regular Rate:Normal  ECHO 8/23 1. Left ventricular ejection fraction, by estimation, is 50 to 55%. The  left ventricle has low normal function. The left ventricle has no regional  wall motion abnormalities. Left ventricular diastolic parameters were  normal.  2. Right ventricular systolic function is normal. The right ventricular  size is normal. There is mildly elevated pulmonary artery systolic  pressure.  3. The mitral valve is normal in structure. No evidence of mitral valve  regurgitation.  4. The aortic valve is tricuspid. Aortic valve regurgitation is not  visualized.  5. The inferior vena cava is normal in size with <50% respiratory  variability, suggesting right atrial pressure of 8 mmHg.    Neuro/Psych Anxiety negative neurological ROS     GI/Hepatic negative GI ROS, Neg liver ROS,   Endo/Other  negative endocrine ROS  Renal/GU negative Renal ROS  negative genitourinary   Musculoskeletal  (+) Arthritis , Osteoarthritis,    Abdominal   Peds  Hematology negative hematology ROS (+)   Anesthesia Other Findings   Reproductive/Obstetrics                             Anesthesia Physical Anesthesia Plan  ASA: 3  Anesthesia Plan: MAC   Post-op Pain Management:    Induction: Intravenous  PONV Risk Score and Plan: 1 and Treatment may vary due to age or medical  condition  Airway Management Planned: Nasal Cannula and Natural Airway  Additional Equipment: None  Intra-op Plan:   Post-operative Plan:   Informed Consent: I have reviewed the patients History and Physical, chart, labs and discussed the procedure including the risks, benefits and alternatives for the proposed anesthesia with the patient or authorized representative who has indicated his/her understanding and acceptance.     Dental advisory given  Plan Discussed with: CRNA  Anesthesia Plan Comments:         Anesthesia Quick Evaluation

## 2021-09-12 NOTE — Progress Notes (Addendum)
Occupational Therapy Treatment Patient Details Name: Anthony [name] MRN: [id number] DOB: [birth year] Today's Date: 09/12/2021   History of present illness Pt is a 51 y/o male admitted secondary to sepsis likely from chronic wounds. Pt with necrotic L femur and acetabulum.On 8/24, pt underwent Girdlestone amputation left hip with partial excision of the acetabulum, I&D with multiple organisms noted. Likely planned staged I&D of L hip during admission. PMH includes  spina bifida and paraplegia, neurogenic bladder with suprapubic catheter, complicated by chronic sacral wounds, chronic hip wound.   OT comments  Limited session d/t pt reluctant to participate in EOB/OOB attempts until serial procedures of L hip completed. Pt able to demo good UB strength for manuvering in bed and using trapeze bar though declined participation in theraband exercises. Pt is inquiring about heavy duty, light weight manual wheelchair that has been ordered and hopeful it is not the [employer] brand - SW/CM aware. Encouraged pt consideration of therapy participation as unsure when procedures will be complete and would not want to prolong time in bed. Will continue to follow acutely though if pt continues to decline EOB/OOB attempts, will have to consider signing off at acute level.   Recommendations for follow up therapy are one component of a multi-disciplinary discharge planning process, led by the attending physician.  Recommendations may be updated based on patient status, additional functional criteria and insurance authorization.    Follow Up Recommendations  Skilled nursing-short term rehab (<3 hours/day)    Assistance Recommended at Discharge Intermittent Supervision/Assistance  Patient can return home with the following  A little help with walking and/or transfers;A little help with bathing/dressing/bathroom;Assist for transportation   Equipment Recommendations  Wheelchair (measurements OT);Wheelchair cushion  (measurements OT) (heavy duty, lightweight manual wheelchair with pressure relieving cushion)    Recommendations for Other Services      Precautions / Restrictions Precautions Precautions: Fall Precaution Comments: hx of paraplegia, suprapubic catheter Restrictions Weight Bearing Restrictions: Yes RLE Weight Bearing: Non weight bearing LLE Weight Bearing: Non weight bearing       Mobility Bed Mobility Overal bed mobility: Needs Assistance             General bed mobility comments: able to demo pull ups with trapeze bar and partial rolling to clear back/shoulders. declined EOB/OOB    Transfers                   General transfer comment: declined     Balance                                           ADL either performed or assessed with clinical judgement   ADL Overall ADL's : Needs assistance/impaired Eating/Feeding: Independent;Bed level                                     General ADL Comments: Pt declined EOB/OOB attempts due to pending procedures of L hip despite education on MD orders for therapy participation. Pt able to demo trapeze bar for pull ups easily in bed though pt declined theraband exercises. Pt inquiring about wheelchair that has been ordered - reached out to CM/SW for this    Extremity/Trunk Assessment Upper Extremity Assessment Upper Extremity Assessment: Overall WFL for tasks assessed   Lower Extremity Assessment Lower Extremity Assessment: Defer to PT  evaluation        Vision   Vision Assessment?: No apparent visual deficits   Perception     Praxis      Cognition Arousal/Alertness: Awake/alert Behavior During Therapy: WFL for tasks assessed/performed Overall Cognitive Status: No family/caregiver present to determine baseline cognitive functioning                                 General Comments: pleasant though decreased engagement in therapy importance        Exercises  Exercises: Other exercises Other Exercises Other Exercises: pull ups with trapeze bar    Shoulder Instructions       General Comments      Pertinent Vitals/ Pain       Pain Assessment Pain Assessment: Faces Faces Pain Scale: Hurts a little bit Pain Location: L hip Pain Descriptors / Indicators: Grimacing Pain Intervention(s): Monitored during session  Home Living                                          Prior Functioning/Environment              Frequency  Min 2X/week        Progress Toward Goals  OT Goals(current goals can now be found in the care plan section)  Progress towards OT goals: OT to reassess next treatment  Acute Rehab OT Goals Patient Stated Goal: avoid moving EOB/OOB until after procedures OT Goal Formulation: With patient Time For Goal Achievement: 09/18/21 Potential to Achieve Goals: Good ADL Goals Pt Will Perform Lower Body Bathing: with modified independence;sitting/lateral leans Pt Will Perform Lower Body Dressing: with modified independence;sitting/lateral leans Pt Will Transfer to Toilet: with transfer board;with supervision Pt Will Perform Toileting - Clothing Manipulation and hygiene: with modified independence;sitting/lateral leans Additional ADL Goal #1: Pt will complete bed mobility with min guard for safety, as a precursor to seated ADLs  Plan Discharge plan remains appropriate    Co-evaluation                 AM-PAC OT "6 Clicks" Daily Activity     Outcome Measure   Help from another person eating meals?: None Help from another person taking care of personal grooming?: A Little Help from another person toileting, which includes using toliet, bedpan, or urinal?: A Little Help from another person bathing (including washing, rinsing, drying)?: A Little Help from another person to put on and taking off regular upper body clothing?: A Little Help from another person to put on and taking off regular lower  body clothing?: A Little 6 Click Score: 19    End of Session    OT Visit Diagnosis: Muscle weakness (generalized) (M62.81);Pain   Activity Tolerance Other (comment) (self limiting)   Patient Left in bed;with call bell/phone within reach   Nurse Communication Other (comment) (wheelchair orders)        Time: 0347-4259 OT Time Calculation (min): 17 min  Charges: OT General Charges $OT Visit: 1 Visit OT Treatments $Therapeutic Activity: 8-22 mins  Bradd Canary, OTR/L Acute Rehab Services Office: [phone]   Lorre Munroe 09/12/2021, 2:22 PM

## 2021-09-12 NOTE — Progress Notes (Signed)
Patient ID: Anthony [name], male   DOB: May 11, 1970, 51 y.o.   MRN: [id number] Patient is postoperative day 1 Girdlestone amputation left hip as well as partial excision of the acetabulum.  Patient had a massive necrotic ulcer and cultures are showing multiple organisms.  There is 400 cc in the wound VAC canister.  Plan for repeat surgery tomorrow, Friday.  This may require additional serial debridements.

## 2021-09-12 NOTE — Assessment & Plan Note (Addendum)
S/P Ortho surgery.  -Colace scheduled.  -Dulcolax and senna prn

## 2021-09-12 NOTE — Progress Notes (Signed)
Pharmacy Antibiotic Note  Anthony [name] is a 51 y.o. male admitted on 09/03/2021 presenting with sepsis (fever, hypotension, elevated lactic acid) likely due to chronic decubitus wounds now with drainage. 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 Further has chronic draining let hip sinus tract with CT pelvis demonstrating air tracking. [name] on 8/21 showed probable anterior MV leaflet vegetation. Pharmacy has been consulted for Zosyn dosing. Noted patient is paraplegic with baseline Scr ~0.6-0.7.   8/23 wound gram stain shows GNRs, GPC in pairs, GPRs--transitioning from Unasyn>>Zosyn for better gram (-) coverage until wound cx result.   Scr 0.69, WBC 19.8 (increase likely due to surgery), afebrile  Plan: Discontinue Unasyn 3g q6h  Start Zosyn 3.375g q8h  Continue doxycycline 100mg  BID  F/u wound cx for abx narrowing  Monitor renal function, cultures, LOT   Height: 5\' 8"  (172.7 cm) Weight: 87.7 kg (193 lb 5.5 oz) IBW/kg (Calculated) : 68.4  Temp (24hrs), Avg:98.8 F (37.1 C), Min:97.6 F (36.4 C), Max:99.5 F (37.5 C)  Recent Labs  Lab 09/06/21 0117 09/07/21 0332 09/08/21 0442 09/09/21 0746 09/12/21 0117  WBC  --  11.5*  --  14.9* 19.8*  CREATININE 0.84 0.76 0.74 0.69  --      Estimated Creatinine Clearance: 117.6 mL/min (by C-G formula based on SCr of 0.69 mg/dL).    Allergies  Allergen Reactions   Vancomycin Itching   Antimicrobials: Dapto 8/15 >> 8/21 MTZ 8/16 >> 8/22 CTX 8/15 >> 8/22 Unasyn 8/22 >> 8/24 Doxy 8/22 >>  Zosyn 8/24 >>   8/16 CK 92 8/18 CK 19  Microbiology  8/15 Ucx: VRE (S-ampicillin)  8/15 Bcx: streptococcus anginosis 8/17 MRSA PCR - negative 8/18 Bcx: NG 8/23 wound cx: pending    9/18, PharmD PGY1 Pharmacy Resident 8/24/202312:18 PM

## 2021-09-12 NOTE — Progress Notes (Signed)
Orthopedic Tech Progress Note Patient Details:  Anthony [name] 1970/06/18 [id number] Applied Trapeze frame to patients bed Patient ID: [name], male   DOB: Nov 19, 1970, 51 y.o.   MRN: [id number]  [name] 09/12/2021, 10:02 AM

## 2021-09-12 NOTE — Progress Notes (Signed)
Daily Progress Note Intern Pager: [phone]  Patient name: Anthony [name] Medical record number: [id number] Date of birth: 1970-04-14 Age: 51 y.o. Gender: male  Primary Care Provider: Erick Alley, DO Consultants: PCCM, ID, Ortho surgery Code Status: Full code  Pt Overview and Major Events to Date:  8/15: Admitted for sepsis 8/16: Septic shock, transfered to ICU. Started on pressors 8/18: Off pressors, transferred back to FMTS 8/23: s/p removal of left hip femoral head   Assessment and Plan:  Anthony [name] is a 51 y.o. male with PMHx of paraplegia from spina bifida who was admitted for sepsis likely from L hip septic arthritis. He briefly spent time in the ICU on pressors due to progression of septic shock, now stabilized though persistently febrile, receiving IV antibiotics, no s/p removal of left hip femoral head.   * Septic arthritis of hip (HCC) S/P removal of left femoral head 8/22. Op note detailed black necrotic femoral head, acetabulum, and ischial spine. Patient was afebrile overnight. Continued on antibiotics. - Scheduled Tylenol, 5 mg Oxy as needed for pain control - Wound cultures pending: gram-negative rods, gram-positive rods, pared gram-positive cocci - [name] showed vegetation on the mitral valve.  6 weeks IV antibiotics - ID consulted, appreciate recommendations  - Ortho surgery consulted, appreciate recommendations, second wound debridement scheduled for Friday  Abx timeline:  CTX: 8/15, 8/17- 8/22 Daptomycin: 8/16- 8/21 IV flagyl: 8/16-8/18 PO flagyl: 8/18- 8/22 Amp-sulbactam 8/22- PO doxycycline: 8/22-  Pressure ulcer Chronic stage IV pressure ulcer on left buttock, stage III and stage II ulcers present in sacral region.  Additional wound on dorsum of right foot noted. Wound care consulted, topical wound care orders in place. No further needs from WOC at this time, bedside nursing to perform wound care. - Wound care per bedside nursing  Constipation due  to opioid therapy S/P Ortho surgery.  Opioids for pain control PRN - Start MiraLAX and senna as needed  Anemia following surgery Hemoglobin 7.4 following surgery.  Prior to surgery hemoglobin was 9.8. - Recheck hemoglobin 1700 8/24 - Transfuse if less than 7  Anxiety Patient anxious about undergoing surgery. - Prescribed hydroxyzine 25 mg TID PRN  Prolonged Q-T interval on ECG On flecainide for PVC, per cardiology. Curbsided cardiology who advised to monitor QTc on telemetry, difficult to read on his EKG. On tele this AM QTc 460s. - Monitor QTc on tele -- Optimize electrolytes K>4, Mg >2 - Electrolytes have been stable.   FEN/GI: Regular diet PPx: Lovenox Dispo:Pending PT recommendations  pending clinical improvement .   Subjective:  Patient resting comfortably.  No acute events overnight.  Pain has been well controlled.  He has not passed gas or had a bowel movement since surgery.  Objective: Temp:  [97.6 F (36.4 C)-99.5 F (37.5 C)] 98.4 F (36.9 C) (08/24 0912) Pulse Rate:  [80-99] 87 (08/24 0912) Resp:  [15-30] 17 (08/24 0912) BP: (90-129)/(41-76) 115/70 (08/24 0912) SpO2:  [91 %-96 %] 95 % (08/24 0912) Weight:  [87.7 kg] 87.7 kg (08/23 1138) Physical Exam: General: Chronically ill-appearing, no acute distress Cardiovascular: Regular rate, regular rhythm Respiratory: Clear, no increased work of breathing, no crackles Abdomen: Soft, nontender, nondistended Extremities: No peripheral edema  Laboratory: Most recent CBC Lab Results  Component Value Date   WBC 19.8 (H) 09/12/2021   HGB 7.4 (L) 09/12/2021   HCT 22.9 (L) 09/12/2021   MCV 79.5 (L) 09/12/2021   PLT 373 09/12/2021   Most recent BMP  Latest Ref Rng & Units 09/09/2021    7:46 AM  BMP  Glucose 70 - 99 mg/dL 95   BUN 6 - 20 mg/dL 10   Creatinine 1.54 - 1.24 mg/dL 0.08   Sodium 676 - 195 mmol/L 140   Potassium 3.5 - 5.1 mmol/L 4.2   Chloride 98 - 111 mmol/L 107   CO2 22 - 32 mmol/L 25    Calcium 8.9 - 10.3 mg/dL 8.0     Anthony Chard, DO 09/12/2021, 10:56 AM  PGY-1, [place] [facility] FPTS Intern pager: [phone], text pages welcome Secure chat group [facility] Teaching Service

## 2021-09-12 NOTE — Progress Notes (Signed)
[facility]  Date of Admission:  09/03/2021     Abx: 8/22-c doxy 8/24-c piptazo  8/22-24 amp-sulbactam 8/16-22 dapto 8/16-22 ceftriaxone 8/16-22 metronidazole     Assessment: 51 yo spina bifida with partial paraplegia, hypospadia/urethro-cutaneous fistula, chronic suprapubic catheter, bilateral hip dysplasia, hx ischiopubic abscess 2019 s/p debridement, chronic draining left hip sinus tract, admitted 8/16 with septic shock in setting several days increased left  hip discharge  W/u thus far Admission bcx 1 set done. Strep angi. Repeat bcx negative.  Abd pelv ct without intraabdominal abscess  8/20 mri hip confirmed septic arthritis with associated periarticular abscesses [name] 8/21 showed mv vegetation  Vre in urine unclears if playing a role. Susceptible to ampicillin  Ongoing sepsis despite appropriate abx. Dr duda takes to OR 8/23 for hip joint abscess debridement, eventual girdle stone.     ------------------- 8/22 assessment S/p initial I&D left hip joint. Planned girdle stone eventually this admission  Discussed with Dr Sharol Given, who wants to give him a longer course of abx. Will plan for 4-6 week course iv vs po to be determined based on micro      Plan: Transition to piptazo for better gram negative coverage; continue doxy F/u operative cx Discussed with primary team  I spent more than 35 minute reviewing data/chart, and coordinating care and >50% direct face to face time providing counseling/discussing diagnostics/treatment plan with patient    Principal Problem:   Septic arthritis of hip ([place]) Active Problems:   Anxiety   Pressure ulcer   Spina bifida of lumbar region ([place])   Prolonged Q-T interval on ECG   Osteomyelitis (H[facility])   Pyogenic arthritis of left hip ([facility])   Hypokalemia   Sepsis ([place])   Bacteremia   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   Endocarditis of mitral valve   Constipation due to  opioid therapy   Anemia following surgery   Allergies  Allergen Reactions   Vancomycin Itching    Scheduled Meds:  acetaminophen  1,000 mg Oral BID   Chlorhexidine Gluconate Cloth  6 each Topical Daily   doxycycline  100 mg Oral Q12H   enoxaparin (LOVENOX) injection  40 mg Subcutaneous Daily   flecainide  50 mg Oral BID   rosuvastatin  40 mg Oral Daily   sodium hypochlorite   Irrigation BID   Continuous Infusions:  sodium chloride 10 mL/hr at 09/09/21 1114   piperacillin-tazobactam (ZOSYN)  IV     PRN Meds:.famotidine, [COMPLETED] hydrOXYzine **FOLLOWED BY** hydrOXYzine, ibuprofen, lip balm, loratadine, mouth rinse, oxyCODONE **OR** oxyCODONE, polyethylene glycol, prochlorperazine, senna-docusate   SUBJECTIVE: Fever curve improves post debridement. Patient also feeling much better No n/v/diarrhea  Wound vac on at this time at surgical site More OR planned for tomorrow with Dr Sharol Given    Review of Systems: ROS All other ROS was negative, except mentioned above     OBJECTIVE: Vitals:   09/11/21 2006 09/12/21 0040 09/12/21 0435 09/12/21 0912  BP: 117/63 (!) 103/56 106/64 115/70  Pulse: 91 84 80 87  Resp: 18 17 18 17   Temp: 99.5 F (37.5 C) 98.2 F (36.8 C) 97.6 F (36.4 C) 98.4 F (36.9 C)  TempSrc: Oral Oral Oral Oral  SpO2: 95% 93% 96% 95%  Weight:      Height:       Body mass index is 29.4 kg/m.  Physical Exam General/constitutional: no distress, pleasant HEENT: Normocephalic, PER, Conj Clear, EOMI, Oropharynx clear Neck supple  CV: rrr no mrg Lungs: clear to auscultation, normal respiratory effort Abd: Soft, Nontender Ext: no edema; atrophic bilateral LE Skin/msk; left hip surgical site wound vac on  Neuro: paraplegia    Lab Results Lab Results  Component Value Date   WBC 19.8 (H) 09/12/2021   HGB 7.4 (L) 09/12/2021   HCT 22.9 (L) 09/12/2021   MCV 79.5 (L) 09/12/2021   PLT 373 09/12/2021    Lab Results  Component Value Date   CREATININE  0.69 09/09/2021   BUN 10 09/09/2021   NA 140 09/09/2021   K 4.2 09/09/2021   CL 107 09/09/2021   CO2 25 09/09/2021    Lab Results  Component Value Date   ALT 40 09/07/2021   AST 38 09/07/2021   ALKPHOS 310 (H) 09/07/2021   BILITOT 0.4 09/07/2021      Microbiology: Recent Results (from the past 240 hour(s))  Resp Panel by RT-PCR (Flu A&B, Covid) Anterior Nasal Swab     Status: None   Collection Time: 09/03/21  3:36 PM   Specimen: Anterior Nasal Swab  Result Value Ref Range Status   SARS Coronavirus 2 by RT PCR NEGATIVE NEGATIVE Final    Comment: (NOTE) SARS-CoV-2 target nucleic acids are NOT DETECTED.  The SARS-CoV-2 RNA is generally detectable in upper respiratory specimens during the acute phase of infection. The lowest concentration of SARS-CoV-2 viral copies this assay can detect is 138 copies/mL. A negative result does not preclude SARS-Cov-2 infection and should not be used as the sole basis for treatment or other patient management decisions. A negative result may occur with  improper specimen collection/handling, submission of specimen other than nasopharyngeal swab, presence of viral mutation(s) within the areas targeted by this assay, and inadequate number of viral copies(<138 copies/mL). A negative result must be combined with clinical observations, patient history, and epidemiological information. The expected result is Negative.  Fact Sheet for Patients:  BloggerCourse.com  Fact Sheet for Healthcare Providers:  SeriousBroker.it  This test is no t yet approved or cleared by the [place] FDA and  has been authorized for detection and/or diagnosis of SARS-CoV-2 by FDA under an Emergency Use Authorization (EUA). This EUA will remain  in effect (meaning this test can be used) for the duration of the COVID-19 declaration under Section 564(b)(1) of the Act, 21 U.S.C.section 360bbb-3(b)(1), unless the  authorization is terminated  or revoked sooner.       Influenza A by PCR NEGATIVE NEGATIVE Final   Influenza B by PCR NEGATIVE NEGATIVE Final    Comment: (NOTE) The Xpert Xpress SARS-CoV-2/FLU/RSV plus assay is intended as an aid in the diagnosis of influenza from Nasopharyngeal swab specimens and should not be used as a sole basis for treatment. Nasal washings and aspirates are unacceptable for Xpert Xpress SARS-CoV-2/FLU/RSV testing.  Fact Sheet for Patients: BloggerCourse.com  Fact Sheet for Healthcare Providers: SeriousBroker.it  This test is not yet approved or cleared by the [place] FDA and has been authorized for detection and/or diagnosis of SARS-CoV-2 by FDA under an Emergency Use Authorization (EUA). This EUA will remain in effect (meaning this test can be used) for the duration of the COVID-19 declaration under Section 564(b)(1) of the Act, 21 U.S.C. section 360bbb-3(b)(1), unless the authorization is terminated or revoked.  Performed at [facility], [address] [place], [place] [address]   Urine Culture     Status: Abnormal   Collection Time: 09/03/21  3:36 PM   Specimen: In/Out Cath Urine  Result Value Ref Range Status   Specimen Description IN/OUT CATH URINE  Final   Special Requests NONE  Final   Culture (A)  Final    >=100,000 COLONIES/mL VANCOMYCIN RESISTANT ENTEROCOCCUS   Report Status 09/05/2021 FINAL  Final   Organism ID, Bacteria VANCOMYCIN RESISTANT ENTEROCOCCUS (A)  Final      Susceptibility   Vancomycin resistant enterococcus - MIC*    AMPICILLIN <=2 SENSITIVE Sensitive     NITROFURANTOIN <=16 SENSITIVE Sensitive     VANCOMYCIN >=32 RESISTANT Resistant     PENICILLIN Value in next row Sensitive      SENSITIVE MIC 4 SPerformed at [facility] [address], [place], [place] [address]    * >=100,000 COLONIES/mL VANCOMYCIN RESISTANT ENTEROCOCCUS  Blood Culture (routine x 2)      Status: Abnormal   Collection Time: 09/03/21  4:18 PM   Specimen: BLOOD  Result Value Ref Range Status   Specimen Description BLOOD SITE NOT SPECIFIED  Final   Special Requests   Final    BOTTLES DRAWN AEROBIC AND ANAEROBIC Blood Culture adequate volume   Culture  Setup Time   Final    GRAM POSITIVE COCCI IN CHAINS ANAEROBIC BOTTLE ONLY CRITICAL RESULT CALLED TO, READ BACK BY AND VERIFIED WITH: [place] JT:[id number] AT [id number] BY EC Performed at [facility], [place] [address] [place], [place] [address]    Culture STREPTOCOCCUS ANGINOSIS (A)  Final   Report Status 09/08/2021 FINAL  Final   Organism ID, Bacteria STREPTOCOCCUS ANGINOSIS  Final      Susceptibility   Streptococcus anginosis - MIC*    PENICILLIN <=0.06 SENSITIVE Sensitive     CEFTRIAXONE 0.5 SENSITIVE Sensitive     ERYTHROMYCIN <=0.12 SENSITIVE Sensitive     LEVOFLOXACIN 0.5 SENSITIVE Sensitive     VANCOMYCIN 0.5 SENSITIVE Sensitive     * STREPTOCOCCUS ANGINOSIS  Blood Culture ID Panel (Reflexed)     Status: Abnormal   Collection Time: 09/03/21  4:18 PM  Result Value Ref Range Status   Enterococcus faecalis NOT DETECTED NOT DETECTED Final   Enterococcus Faecium NOT DETECTED NOT DETECTED Final   Listeria monocytogenes NOT DETECTED NOT DETECTED Final   Staphylococcus species NOT DETECTED NOT DETECTED Final   Staphylococcus aureus (BCID) NOT DETECTED NOT DETECTED Final   Staphylococcus epidermidis NOT DETECTED NOT DETECTED Final   Staphylococcus lugdunensis NOT DETECTED NOT DETECTED Final   Streptococcus species DETECTED (A) NOT DETECTED Final    Comment: Not Enterococcus species, Streptococcus agalactiae, Streptococcus pyogenes, or Streptococcus pneumoniae. CRITICAL RESULT CALLED TO, READ BACK BY AND VERIFIED WITH: PHARMD SYDNEY DAVIS JT:[id number] AT 1138 BY EC    Streptococcus agalactiae NOT DETECTED NOT DETECTED Final   Streptococcus pneumoniae NOT DETECTED NOT DETECTED Final   Streptococcus pyogenes NOT DETECTED  NOT DETECTED Final   A.calcoaceticus-baumannii NOT DETECTED NOT DETECTED Final   Bacteroides fragilis NOT DETECTED NOT DETECTED Final   Enterobacterales NOT DETECTED NOT DETECTED Final   Enterobacter cloacae complex NOT DETECTED NOT DETECTED Final   Escherichia coli NOT DETECTED NOT DETECTED Final   Klebsiella aerogenes NOT DETECTED NOT DETECTED Final   Klebsiella oxytoca NOT DETECTED NOT DETECTED Final   Klebsiella pneumoniae NOT DETECTED NOT DETECTED Final   Proteus species NOT DETECTED NOT DETECTED Final   Salmonella species NOT DETECTED NOT DETECTED Final   Serratia marcescens NOT DETECTED NOT DETECTED Final   Haemophilus influenzae NOT DETECTED NOT DETECTED Final   Neisseria meningitidis NOT DETECTED NOT DETECTED Final  Pseudomonas aeruginosa NOT DETECTED NOT DETECTED Final   Stenotrophomonas maltophilia NOT DETECTED NOT DETECTED Final   Candida albicans NOT DETECTED NOT DETECTED Final   Candida auris NOT DETECTED NOT DETECTED Final   Candida glabrata NOT DETECTED NOT DETECTED Final   Candida krusei NOT DETECTED NOT DETECTED Final   Candida parapsilosis NOT DETECTED NOT DETECTED Final   Candida tropicalis NOT DETECTED NOT DETECTED Final   Cryptococcus neoformans/gattii NOT DETECTED NOT DETECTED Final    Comment: Performed at [facility], [address], [place], [place] [address]  MRSA Next Gen by PCR, Nasal     Status: None   Collection Time: 09/05/21  1:20 AM   Specimen: Nasal Mucosa; Nasal Swab  Result Value Ref Range Status   MRSA by PCR Next Gen NOT DETECTED NOT DETECTED Final    Comment: (NOTE) The GeneXpert MRSA Assay (FDA approved for NASAL specimens only), is one component of a comprehensive MRSA colonization surveillance program. It is not intended to diagnose MRSA infection nor to guide or monitor treatment for MRSA infections. Test performance is not FDA approved in patients less than 46 years old. Performed at [facility], [address],  [place], [place] [address]   Culture, blood (Routine X 2) w Reflex to ID Panel     Status: None   Collection Time: 09/06/21  1:29 PM   Specimen: BLOOD  Result Value Ref Range Status   Specimen Description BLOOD RIGHT ANTECUBITAL  Final   Special Requests IN PEDIATRIC BOTTLE Blood Culture adequate volume  Final   Culture   Final    NO GROWTH 5 DAYS Performed at [facility], [address] [place], [place] [address]    Report Status 09/11/2021 FINAL  Final  Culture, blood (Routine X 2) w Reflex to ID Panel     Status: None   Collection Time: 09/06/21  1:29 PM   Specimen: BLOOD RIGHT HAND  Result Value Ref Range Status   Specimen Description BLOOD RIGHT HAND  Final   Special Requests IN PEDIATRIC BOTTLE Blood Culture adequate volume  Final   Culture   Final    NO GROWTH 5 DAYS Performed at [facility], [address], [place], [place] [address]    Report Status 09/11/2021 FINAL  Final  MRSA Next Gen by PCR, Nasal     Status: None   Collection Time: 09/10/21 10:04 PM   Specimen: Nasal Mucosa; Nasal Swab  Result Value Ref Range Status   MRSA by PCR Next Gen NOT DETECTED NOT DETECTED Final    Comment: (NOTE) The GeneXpert MRSA Assay (FDA approved for NASAL specimens only), is one component of a comprehensive MRSA colonization surveillance program. It is not intended to diagnose MRSA infection nor to guide or monitor treatment for MRSA infections. Test performance is not FDA approved in patients less than 78 years old. Performed at [facility], [address], [place], [place] [address]   Aerobic/Anaerobic Culture w Gram Stain (surgical/deep wound)     Status: None (Preliminary result)   Collection Time: 09/11/21  2:38 PM   Specimen: Bone; Tissue  Result Value Ref Range Status   Specimen Description BONE  Final   Special Requests LEFT HIP BONE  Final   Gram Stain PENDING  Incomplete   Culture   Final    CULTURE REINCUBATED FOR BETTER GROWTH Performed at [facility], [address], [place], [place] [address]  Report Status PENDING  Incomplete  Aerobic/Anaerobic Culture w Gram Stain (surgical/deep wound)     Status: None (Preliminary result)   Collection Time: 09/11/21  2:40 PM   Specimen: Wound; Tissue  Result Value Ref Range Status   Specimen Description WOUND  Final   Special Requests LEFT HIP WOUND  Final   Gram Stain PENDING  Incomplete   Culture   Final    CULTURE REINCUBATED FOR BETTER GROWTH Performed at [facility], [address] [place], [place] [address]    Report Status PENDING  Incomplete  Aerobic/Anaerobic Culture w Gram Stain (surgical/deep wound)     Status: None (Preliminary result)   Collection Time: 09/11/21  2:41 PM   Specimen: Wound; Tissue  Result Value Ref Range Status   Specimen Description WOUND  Final   Special Requests LEFT HIP WOUND  Final   Gram Stain   Final    ABUNDANT WBC PRESENT, PREDOMINANTLY PMN ABUNDANT GRAM NEGATIVE RODS ABUNDANT GRAM POSITIVE COCCI IN PAIRS ABUNDANT GRAM POSITIVE RODS    Culture   Final    CULTURE REINCUBATED FOR BETTER GROWTH Performed at [facility], [place] [address], [place],  [address]    Report Status PENDING  Incomplete     Serology:   Imaging: If present, new imagings (plain films, ct scans, and mri) have been personally visualized and interpreted; radiology reports have been reviewed. Decision making incorporated into the Impression / Recommendations.  8/20 mri left hip 1. Soft tissue wound overlying the left ischial tuberosity with surrounding severe cellulitis. Complex multiloculated fluid collection with air in the fluid overlying the posterolateral aspect of the greater trochanter and extending into the proximal thigh measuring 6.8 x 4.3 x 8.3 cm consistent with an abscess. Second complex fluid collection extends medial to the proximal femur along the lesser trochanter measuring 2.6 x 8.4 x 2.8 cm. Mild marrow edema in the  posterior left acetabulum concerning for osteomyelitis. 2. Muscle edema in the left adductor musculature and to lesser extent right adductor musculature which may reflect neurogenic edema versus infectious myositis.  8/15 abd pelv ct with contrast Multiple large bladder stones are noted. Urinary bladder is decompressed secondary to Foley catheter.   Continued presence of large decubitus ulcer seen involving the left buttocks which is not significantly changed compared to prior exam. It is seen to extend to the base of the penis.   Stable chronic changes are seen involving both hip joints consistent with hip dysplasia and heterotopic bone formation. However, there is now noted air within the left hip joint concerning for possible septic arthritis.    Jabier Mutton, [place] [place] [phone] pager    09/12/2021, 11:38 AM

## 2021-09-12 NOTE — Anesthesia Postprocedure Evaluation (Signed)
Anesthesia Post Note  Patient: Anthony [name]  Procedure(s) Performed: TRANSESOPHAGEAL ECHOCARDIOGRAM ([name]) BUBBLE STUDY     Patient location during evaluation: Endoscopy Anesthesia Type: MAC Level of consciousness: awake and alert Pain management: pain level controlled Vital Signs Assessment: post-procedure vital signs reviewed and stable Respiratory status: spontaneous breathing, nonlabored ventilation, respiratory function stable and patient connected to nasal cannula oxygen Cardiovascular status: stable and blood pressure returned to baseline Postop Assessment: no apparent nausea or vomiting Anesthetic complications: no   No notable events documented.  Last Vitals:  Vitals:   09/12/21 1725 09/12/21 2123  BP: 126/76 128/70  Pulse: 93 92  Resp: 17 18  Temp: 37.2 C 36.4 C  SpO2: 96% 94%    Last Pain:  Vitals:   09/12/21 2123  TempSrc: Oral  PainSc:                  Evan Osburn

## 2021-09-12 NOTE — H&P (View-Only) (Signed)
Patient ID: Anthony [name], male   DOB: 11/25/1970, 50 y.o.   MRN: [id number] Patient is postoperative day 1 [place] amputation left hip as well as partial excision of the acetabulum.  Patient had a massive necrotic ulcer and cultures are showing multiple organisms.  There is 400 cc in the wound VAC canister.  Plan for repeat surgery tomorrow, Friday.  This may require additional serial debridements. 

## 2021-09-12 NOTE — Progress Notes (Signed)
Ortho tech contacted for overhead trapeze. Tech will deliver to pt room.

## 2021-09-12 NOTE — Progress Notes (Signed)
PT Cancellation Note  Patient Details Name: Anthony [name] MRN: [id number] DOB: October 20, 1970   Cancelled Treatment:    Reason Eval/Treat Not Completed: Patient declined, no reason specified (Pt adamantly refusing physical therapy. Pt states it is not a good time due to upcoming procedures. Pt educated on negative effects of bed rest and immobility but still unwilling. Will retry tomorrow.)  Tana Coast, PT   Tana Coast 09/12/2021, 2:22 PM

## 2021-09-12 NOTE — Progress Notes (Signed)
Patient was transfer from Pacu,alert and oriented x4 was with mum,no c/o of pain, patient made comfortable and assessment done,will continue to monitor.

## 2021-09-13 ENCOUNTER — Encounter (HOSPITAL_COMMUNITY)
Admission: EM | Disposition: A | Discharge status: Skilled Nursing Facility | Payer: Self-pay | Source: Home / Self Care | Attending: Family Medicine

## 2021-09-13 ENCOUNTER — Inpatient Hospital Stay (HOSPITAL_COMMUNITY): Payer: Medicare Other | Admitting: Certified Registered Nurse Anesthetist

## 2021-09-13 ENCOUNTER — Encounter (HOSPITAL_COMMUNITY): Payer: Self-pay | Admitting: Family Medicine

## 2021-09-13 ENCOUNTER — Other Ambulatory Visit: Payer: Self-pay

## 2021-09-13 DIAGNOSIS — I10 Essential (primary) hypertension: Secondary | ICD-10-CM | POA: Diagnosis not present

## 2021-09-13 DIAGNOSIS — M009 Pyogenic arthritis, unspecified: Secondary | ICD-10-CM | POA: Diagnosis not present

## 2021-09-13 DIAGNOSIS — D649 Anemia, unspecified: Secondary | ICD-10-CM

## 2021-09-13 DIAGNOSIS — Z87891 Personal history of nicotine dependence: Secondary | ICD-10-CM | POA: Diagnosis not present

## 2021-09-13 DIAGNOSIS — F419 Anxiety disorder, unspecified: Secondary | ICD-10-CM | POA: Diagnosis not present

## 2021-09-13 HISTORY — PX: I & D EXTREMITY: SHX5045

## 2021-09-13 LAB — CBC
HCT: 21.7 % — ABNORMAL LOW (ref 39.0–52.0)
HCT: 28.8 % — ABNORMAL LOW (ref 39.0–52.0)
Hemoglobin: 7 g/dL — ABNORMAL LOW (ref 13.0–17.0)
Hemoglobin: 9.5 g/dL — ABNORMAL LOW (ref 13.0–17.0)
MCH: 25.9 pg — ABNORMAL LOW (ref 26.0–34.0)
MCH: 27.2 pg (ref 26.0–34.0)
MCHC: 32.3 g/dL (ref 30.0–36.0)
MCHC: 33 g/dL (ref 30.0–36.0)
MCV: 80.4 fL (ref 80.0–100.0)
MCV: 82.5 fL (ref 80.0–100.0)
Platelets: 443 10*3/uL — ABNORMAL HIGH (ref 150–400)
Platelets: 552 10*3/uL — ABNORMAL HIGH (ref 150–400)
RBC: 2.7 MIL/uL — ABNORMAL LOW (ref 4.22–5.81)
RBC: 3.49 MIL/uL — ABNORMAL LOW (ref 4.22–5.81)
RDW: 15.9 % — ABNORMAL HIGH (ref 11.5–15.5)
RDW: 15.3 % (ref 11.5–15.5)
WBC: 18.9 10*3/uL — ABNORMAL HIGH (ref 4.0–10.5)
WBC: 19.3 10*3/uL — ABNORMAL HIGH (ref 4.0–10.5)
nRBC: 0 % (ref 0.0–0.2)
nRBC: 0.1 % (ref 0.0–0.2)

## 2021-09-13 LAB — BASIC METABOLIC PANEL
Anion gap: 6 (ref 5–15)
BUN: 12 mg/dL (ref 6–20)
CO2: 23 mmol/L (ref 22–32)
Calcium: 7.7 mg/dL — ABNORMAL LOW (ref 8.9–10.3)
Chloride: 109 mmol/L (ref 98–111)
Creatinine, Ser: 0.69 mg/dL (ref 0.61–1.24)
GFR, Estimated: >60 mL/min (ref >60)
Glucose, Bld: 157 mg/dL — ABNORMAL HIGH (ref 70–99)
Potassium: 4.1 mmol/L (ref 3.5–5.1)
Sodium: 138 mmol/L (ref 135–145)

## 2021-09-13 LAB — PREPARE RBC (CROSSMATCH)

## 2021-09-13 SURGERY — IRRIGATION AND DEBRIDEMENT EXTREMITY
Anesthesia: General | Laterality: Left

## 2021-09-13 MED ORDER — POLYETHYLENE GLYCOL 3350 17 G PO PACK
17.0000 g | PACK | Freq: Every day | ORAL | Status: DC | PRN
Start: 2021-09-13 — End: 2021-09-24

## 2021-09-13 MED ORDER — CHLORHEXIDINE GLUCONATE 0.12 % MT SOLN
OROMUCOSAL | Status: AC
  Administered 2021-09-13: 15 mL via OROMUCOSAL
  Filled 2021-09-13: qty 15

## 2021-09-13 MED ORDER — ROCURONIUM BROMIDE 10 MG/ML (PF) SYRINGE
PREFILLED_SYRINGE | INTRAVENOUS | Status: DC | PRN
  Administered 2021-09-13: 50 mg via INTRAVENOUS

## 2021-09-13 MED ORDER — CEFAZOLIN SODIUM-DEXTROSE 2-4 GM/100ML-% IV SOLN: 2.0000 g | INTRAVENOUS | Status: DC

## 2021-09-13 MED ORDER — FENTANYL CITRATE (PF) 250 MCG/5ML IJ SOLN
INTRAMUSCULAR | Status: DC | PRN
  Administered 2021-09-13: 50 ug via INTRAVENOUS
  Administered 2021-09-13: 100 ug via INTRAVENOUS

## 2021-09-13 MED ORDER — HYDROMORPHONE HCL 1 MG/ML IJ SOLN
INTRAMUSCULAR | Status: AC
  Filled 2021-09-13: qty 1

## 2021-09-13 MED ORDER — ACETAMINOPHEN 500 MG PO TABS: 1000.0000 mg | ORAL_TABLET | Freq: Once | ORAL | Status: DC

## 2021-09-13 MED ORDER — METHOCARBAMOL 1000 MG/10ML IJ SOLN
500.0000 mg | Freq: Four times a day (QID) | INTRAMUSCULAR | Status: DC | PRN
Start: 2021-09-13 — End: 2021-09-18

## 2021-09-13 MED ORDER — POVIDONE-IODINE 10 % EX SWAB: 2.0000 | Freq: Once | CUTANEOUS | Status: DC

## 2021-09-13 MED ORDER — SUGAMMADEX SODIUM 200 MG/2ML IV SOLN
INTRAVENOUS | Status: DC | PRN
  Administered 2021-09-13: 100 mg via INTRAVENOUS

## 2021-09-13 MED ORDER — DOCUSATE SODIUM 100 MG PO CAPS
100.0000 mg | ORAL_CAPSULE | Freq: Two times a day (BID) | ORAL | Status: DC
  Filled 2021-09-13 (×4): qty 1

## 2021-09-13 MED ORDER — FENTANYL CITRATE (PF) 250 MCG/5ML IJ SOLN
INTRAMUSCULAR | Status: AC
  Filled 2021-09-13: qty 5

## 2021-09-13 MED ORDER — MIDAZOLAM HCL 2 MG/2ML IJ SOLN
INTRAMUSCULAR | Status: DC | PRN
  Administered 2021-09-13: 2 mg via INTRAVENOUS

## 2021-09-13 MED ORDER — LIDOCAINE 2% (20 MG/ML) 5 ML SYRINGE
INTRAMUSCULAR | Status: DC | PRN
  Administered 2021-09-13: 100 mg via INTRAVENOUS

## 2021-09-13 MED ORDER — MAGNESIUM CITRATE PO SOLN: 1.0000 | Freq: Once | ORAL | Status: DC | PRN

## 2021-09-13 MED ORDER — BISACODYL 10 MG RE SUPP: 10.0000 mg | Freq: Every day | RECTAL | Status: DC | PRN

## 2021-09-13 MED ORDER — LACTATED RINGERS IV SOLN: INTRAVENOUS | Status: DC

## 2021-09-13 MED ORDER — METOCLOPRAMIDE HCL 5 MG/ML IJ SOLN: 5.0000 mg | Freq: Three times a day (TID) | INTRAMUSCULAR | Status: DC | PRN

## 2021-09-13 MED ORDER — SODIUM CHLORIDE 0.9 % IV SOLN: INTRAVENOUS | Status: DC

## 2021-09-13 MED ORDER — CHLORHEXIDINE GLUCONATE 0.12 % MT SOLN: 15.0000 mL | Freq: Once | OROMUCOSAL | Status: AC

## 2021-09-13 MED ORDER — SODIUM CHLORIDE 0.9% IV SOLUTION
Freq: Once | INTRAVENOUS | Status: AC
Start: 2021-09-13 — End: 2021-09-13

## 2021-09-13 MED ORDER — PHENYLEPHRINE 80 MCG/ML (10ML) SYRINGE FOR IV PUSH (FOR BLOOD PRESSURE SUPPORT)
PREFILLED_SYRINGE | INTRAVENOUS | Status: DC | PRN
  Administered 2021-09-13: 160 ug via INTRAVENOUS
  Administered 2021-09-13 (×3): 80 ug via INTRAVENOUS

## 2021-09-13 MED ORDER — ONDANSETRON HCL 4 MG/2ML IJ SOLN
INTRAMUSCULAR | Status: DC | PRN
  Administered 2021-09-13: 4 mg via INTRAVENOUS

## 2021-09-13 MED ORDER — ORAL CARE MOUTH RINSE: 15.0000 mL | Freq: Once | OROMUCOSAL | Status: AC

## 2021-09-13 MED ORDER — ONDANSETRON HCL 4 MG PO TABS: 4.0000 mg | ORAL_TABLET | Freq: Four times a day (QID) | ORAL | Status: DC | PRN

## 2021-09-13 MED ORDER — SODIUM CHLORIDE 0.9% IV SOLUTION: Freq: Once | INTRAVENOUS | Status: AC

## 2021-09-13 MED ORDER — CHLORHEXIDINE GLUCONATE 4 % EX LIQD: 60.0000 mL | Freq: Once | CUTANEOUS | Status: DC

## 2021-09-13 MED ORDER — HYDROMORPHONE HCL 1 MG/ML IJ SOLN
0.2500 mg | INTRAMUSCULAR | Status: DC | PRN
  Administered 2021-09-13 (×2): 0.5 mg via INTRAVENOUS

## 2021-09-13 MED ORDER — PHENYLEPHRINE HCL-NACL 20-0.9 MG/250ML-% IV SOLN
INTRAVENOUS | Status: DC | PRN
  Administered 2021-09-13: 45 ug/min via INTRAVENOUS

## 2021-09-13 MED ORDER — 0.9 % SODIUM CHLORIDE (POUR BTL) OPTIME
TOPICAL | Status: DC | PRN
  Administered 2021-09-13: 1000 mL

## 2021-09-13 MED ORDER — PROPOFOL 10 MG/ML IV BOLUS
INTRAVENOUS | Status: DC | PRN
  Administered 2021-09-13: 160 mg via INTRAVENOUS

## 2021-09-13 MED ORDER — MIDAZOLAM HCL 2 MG/2ML IJ SOLN
INTRAMUSCULAR | Status: AC
  Filled 2021-09-13: qty 2

## 2021-09-13 MED ORDER — TRANEXAMIC ACID 1000 MG/10ML IV SOLN
INTRAVENOUS | Status: DC | PRN
  Administered 2021-09-13: 100 mL

## 2021-09-13 MED ORDER — METOCLOPRAMIDE HCL 5 MG PO TABS: 5.0000 mg | ORAL_TABLET | Freq: Three times a day (TID) | ORAL | Status: DC | PRN

## 2021-09-13 MED ORDER — METHOCARBAMOL 500 MG PO TABS
500.0000 mg | ORAL_TABLET | Freq: Four times a day (QID) | ORAL | Status: DC | PRN
Start: 2021-09-13 — End: 2021-09-18
  Filled 2021-09-13: qty 1

## 2021-09-13 MED ORDER — PROPOFOL 10 MG/ML IV BOLUS
INTRAVENOUS | Status: AC
  Filled 2021-09-13: qty 20

## 2021-09-13 MED ORDER — DEXAMETHASONE SODIUM PHOSPHATE 10 MG/ML IJ SOLN
INTRAMUSCULAR | Status: DC | PRN
  Administered 2021-09-13: 5 mg via INTRAVENOUS

## 2021-09-13 MED ORDER — ONDANSETRON HCL 4 MG/2ML IJ SOLN: 4.0000 mg | Freq: Four times a day (QID) | INTRAMUSCULAR | Status: DC | PRN

## 2021-09-13 SURGICAL SUPPLY — 39 items
ADH SKN CLS APL DERMABOND .7 (GAUZE/BANDAGES/DRESSINGS) ×1
BAG COUNTER SPONGE SURGICOUNT (BAG) IMPLANT
BAG SPNG CNTER NS LX DISP (BAG)
BLADE SURG 21 STRL SS (BLADE) ×1 IMPLANT
BNDG COHESIVE 6X5 TAN STRL LF (GAUZE/BANDAGES/DRESSINGS) IMPLANT
BNDG GAUZE ELAST 4 BULKY (GAUZE/BANDAGES/DRESSINGS) ×2 IMPLANT
COVER SURGICAL LIGHT HANDLE (MISCELLANEOUS) ×2 IMPLANT
DERMABOND ADVANCED (GAUZE/BANDAGES/DRESSINGS) ×1
DERMABOND ADVANCED .7 DNX12 (GAUZE/BANDAGES/DRESSINGS) IMPLANT
DRAPE U-SHAPE 47X51 STRL (DRAPES) ×1 IMPLANT
DRESSING VERAFLO CLEANS CC MED (GAUZE/BANDAGES/DRESSINGS) IMPLANT
DRESSING VERAFLO CLEANSE CC (GAUZE/BANDAGES/DRESSINGS) IMPLANT
DRSG ADAPTIC 3X8 NADH LF (GAUZE/BANDAGES/DRESSINGS) ×1 IMPLANT
DRSG VERAFLO CLEANSE CC (GAUZE/BANDAGES/DRESSINGS) ×1
DRSG VERAFLO CLEANSE CC MED (GAUZE/BANDAGES/DRESSINGS) ×2
DURAPREP 26ML APPLICATOR (WOUND CARE) ×1 IMPLANT
ELECT REM PT RETURN 9FT ADLT (ELECTROSURGICAL)
ELECTRODE REM PT RTRN 9FT ADLT (ELECTROSURGICAL) IMPLANT
GAUZE SPONGE 4X4 12PLY STRL (GAUZE/BANDAGES/DRESSINGS) ×1 IMPLANT
GLOVE BIOGEL PI IND STRL 9 (GLOVE) ×1 IMPLANT
GLOVE BIOGEL PI INDICATOR 9 (GLOVE) ×1
GLOVE SURG ORTHO 9.0 STRL STRW (GLOVE) ×1 IMPLANT
GOWN STRL REUS W/ TWL XL LVL3 (GOWN DISPOSABLE) ×2 IMPLANT
GOWN STRL REUS W/TWL XL LVL3 (GOWN DISPOSABLE) ×2
HANDPIECE INTERPULSE COAX TIP (DISPOSABLE)
KIT BASIN OR (CUSTOM PROCEDURE TRAY) ×1 IMPLANT
KIT TURNOVER KIT B (KITS) ×1 IMPLANT
MANIFOLD NEPTUNE II (INSTRUMENTS) ×1 IMPLANT
NS IRRIG 1000ML POUR BTL (IV SOLUTION) ×1 IMPLANT
PACK ORTHO EXTREMITY (CUSTOM PROCEDURE TRAY) ×1 IMPLANT
PAD ARMBOARD 7.5X6 YLW CONV (MISCELLANEOUS) ×2 IMPLANT
SET HNDPC FAN SPRY TIP SCT (DISPOSABLE) IMPLANT
STOCKINETTE IMPERVIOUS 9X36 MD (GAUZE/BANDAGES/DRESSINGS) IMPLANT
SUT ETHILON 2 0 PSLX (SUTURE) ×1 IMPLANT
SWAB COLLECTION DEVICE MRSA (MISCELLANEOUS) ×1 IMPLANT
SWAB CULTURE ESWAB REG 1ML (MISCELLANEOUS) IMPLANT
TOWEL GREEN STERILE (TOWEL DISPOSABLE) ×1 IMPLANT
TUBE CONNECTING 12X1/4 (SUCTIONS) ×1 IMPLANT
YANKAUER SUCT BULB TIP NO VENT (SUCTIONS) ×1 IMPLANT

## 2021-09-13 NOTE — Anesthesia Postprocedure Evaluation (Signed)
Anesthesia Post Note  Patient: Anthony [name]  Procedure(s) Performed: DEBRIDEMENT LEFT HIP (Left)     Patient location during evaluation: PACU Anesthesia Type: General Level of consciousness: awake and alert Pain management: pain level controlled Vital Signs Assessment: post-procedure vital signs reviewed and stable Respiratory status: spontaneous breathing, nonlabored ventilation and respiratory function stable Cardiovascular status: blood pressure returned to baseline Postop Assessment: no apparent nausea or vomiting Anesthetic complications: no   No notable events documented.  Last Vitals:  Vitals:   09/13/21 1345 09/13/21 1400  BP: 112/69 109/64  Pulse: 75 79  Resp: (!) 25 (!) 24  Temp:    SpO2: 95% 97%    Last Pain:  Vitals:   09/13/21 1400  TempSrc:   PainSc: Asleep                 Shanda Howells

## 2021-09-13 NOTE — Progress Notes (Signed)
Report called to Sarah in Pre-op. OR case delayed for this pt. Awaiting 2u pRBCs to be ready for surgery. Consents signed and pre-op checklist complete.

## 2021-09-13 NOTE — Consult Note (Signed)
WOC Nurse Consult Note: Reason for Consult:Wound VAC alarming "blockage". Patient had surgery today with Dr. [name].  Bedside RN and Dr. [name] notified by Secure Chat that WOC Nursing was not available on this campus until Monday, 8/28. And that NPWT guidance would need to come from provider.    WOC nursing team will not follow, but will remain available to this patient, the nursing and medical teams.  Please re-consult if needed.  Thank you for inviting [place] to participate in this patient's Plan of Care.  Ladona Mow, MSN, RN, CNS, GNP, Leda Min, [employer], [employer] phone:  [phone]

## 2021-09-13 NOTE — Interval H&P Note (Signed)
History and Physical Interval Note:  09/13/2021 6:51 AM  Anthony [name]  has presented today for surgery, with the diagnosis of Septic Left Hip.  The various methods of treatment have been discussed with the patient and family. After consideration of risks, benefits and other options for treatment, the patient has consented to  Procedure(s): DEBRIDEMENT LEFT HIP (Left) as a surgical intervention.  The patient's history has been reviewed, patient examined, no change in status, stable for surgery.  I have reviewed the patient's chart and labs.  Questions were answered to the patient's satisfaction.     [name]

## 2021-09-13 NOTE — Anesthesia Procedure Notes (Signed)
Procedure Name: Intubation Date/Time: 09/13/2021 11:52 AM  Performed by: Darletta Moll, CRNAPre-anesthesia Checklist: Patient identified, Emergency Drugs available, Suction available and Patient being monitored Patient Re-evaluated:Patient Re-evaluated prior to induction Oxygen Delivery Method: Circle system utilized Preoxygenation: Pre-oxygenation with 100% oxygen Induction Type: IV induction Ventilation: Mask ventilation without difficulty Laryngoscope Size: Mac and 4 Grade View: Grade I Tube type: Oral Tube size: 7.5 mm Number of attempts: 1 Airway Equipment and Method: Stylet and Oral airway Placement Confirmation: ETT inserted through vocal cords under direct vision, positive ETCO2 and breath sounds checked- equal and bilateral Secured at: 22 cm Tube secured with: Tape Dental Injury: Teeth and Oropharynx as per pre-operative assessment

## 2021-09-13 NOTE — Transfer of Care (Signed)
Immediate Anesthesia Transfer of Care Note  Patient: [name]  Procedure(s) Performed: DEBRIDEMENT LEFT HIP (Left)  Patient Location: PACU  Anesthesia Type:General  Level of Consciousness: drowsy and patient cooperative  Airway & Oxygen Therapy: Patient Spontanous Breathing  Post-op Assessment: Report given to RN, Post -op Vital signs reviewed and stable and Patient moving all extremities X 4  Post vital signs: Reviewed and stable  Last Vitals:  Vitals Value Taken Time  BP 107/50   Temp    Pulse 73 09/13/21 1243  Resp 28 09/13/21 1243  SpO2 94 % 09/13/21 1243  Vitals shown include unvalidated device data.  Last Pain:  Vitals:   09/13/21 1117  TempSrc: Oral  PainSc:       Patients Stated Pain Goal: 0 (09/11/21 3887)  Complications: No notable events documented.

## 2021-09-13 NOTE — Op Note (Signed)
09/03/2021 - 09/13/2021  12:42 PM  PATIENT:  Anthony [name]    PRE-OPERATIVE DIAGNOSIS:  Septic Left Hip  POST-OPERATIVE DIAGNOSIS:  Same  PROCEDURE:  DEBRIDEMENT LEFT HIP Partial excision of pelvis and acetabulum. Excision of further necrotic tissue including skin and soft tissue and fascia. Local tissue rearrangement for wound closure 25 x 10 cm. Placement of installation wound VAC sponges x2  SURGEON:  [name], MD  PHYSICIAN ASSISTANT:None ANESTHESIA:   General  PREOPERATIVE INDICATIONS:  [name] is a  51 y.o. male with a diagnosis of Septic Left Hip who failed conservative measures and elected for surgical management.    The risks benefits and alternatives were discussed with the patient preoperatively including but not limited to the risks of infection, bleeding, nerve injury, cardiopulmonary complications, the need for revision surgery, among others, and the patient was willing to proceed.  OPERATIVE IMPLANTS: Wound VAC sponges x2 cleanse choice  @ENCIMAGES @  OPERATIVE FINDINGS: Patient had further necrotic bone muscle and soft tissue this was excised back to bleeding viable tissue.  OPERATIVE PROCEDURE: Patient was brought the operating room and underwent a general anesthetic.  After adequate levels anesthesia were obtained patient was placed in the right lateral decubitus position with the left side up and left lower extremity was prepped using DuraPrep draped into a sterile field with a shower curtain.  An elliptical incision was made around the previous incision to excise back to healthy viable margins.  This left a wound that was 25 x 10 cm.  There is further necrotic pelvic and acetabular bone this was resected with a rondure curette and Cobb elevator.  There was further necrotic soft tissue that was excised back to bleeding viable healthy tissue.  The wound was irrigated with normal saline electrocautery was used hemostasis.  TXA was also applied to the wound  for further hemostasis.  The cleanse choice sponges x2 were placed deep within the wound.  Local tissue rearrangement was used to close the wound over the cleanse choice sponges 25 x 10 cm.  This was set to instillation therapy and this had a good suction fit.  Patient was extubated taken the PACU in stable condition.  Debridement type: Excisional Debridement  Side: left  Body Location: hip   Tools used for debridement: scalpel, curette, and rongeur  Pre-debridement Wound size (cm):   Length: 15        Width: 5     Depth: 5   Post-debridement Wound size (cm):   Length: 25        Width: 10     Depth: 5   Debridement depth beyond dead/damaged tissue down to healthy viable tissue: yes  Tissue layer involved: skin, subcutaneous tissue, muscle / fascia, bone  Nature of tissue removed: Slough, Necrotic, Devitalized Tissue, and Non-viable tissue  Irrigation volume: 1 liter     Irrigation fluid type: Normal Saline    DISCHARGE PLANNING:  Antibiotic duration: Continue IV antibiotics  Weightbearing: Not applicable  Pain medication: Continue opioid pathway  Dressing care/ Wound VAC: Installation wound VAC  Ambulatory devices: Not applicable  Plan for return to the operating room on Wednesday.  Anticipate wound closure on Wednesday.

## 2021-09-13 NOTE — Progress Notes (Signed)
Pt transported to Pre-op at this time. Family at bedside. Pt remains alert and stable at baseline.

## 2021-09-13 NOTE — Progress Notes (Signed)
Daily Progress Note Intern Pager: [phone]  Patient name: Anthony [name] Medical record number: [id number] Date of birth: Mar 17, 1970 Age: 51 y.o. Gender: male  Primary Care Provider: Erick Alley, DO Consultants: Ortho surgery, ID, PCCM Code Status: Full code  Pt Overview and Major Events to Date:  8/15: Admitted for sepsis 8/16: Septic shock, transfered to ICU. Started on pressors 8/18: Off pressors, transferred back to FMTS 8/23: s/p removal of left hip femoral head 8/25: Planned left hip debridement  Assessment and Plan:  Anthony [name] is a 51 y.o. male with PMHx of paraplegia from spina bifida who was admitted for sepsis likely from L hip septic arthritis. He briefly spent time in the ICU on pressors due to progression of septic shock, now stabilized though persistently febrile, receiving IV antibiotics, no s/p removal of left hip femoral head.   * Septic arthritis of hip (HCC) S/P removal of left femoral head 8/22. Op note detailed black necrotic femoral head, acetabulum, and ischial spine. Patient was afebrile overnight. Continued on antibiotics.  Patient taken back to the OR today for wound debridement. - Scheduled Tylenol, 5 mg Oxy as needed for pain control - Wound cultures pending: gram-negative rods, gram-positive rods, pared gram-positive cocci - [name] showed vegetation on the mitral valve.  6 weeks IV antibiotics - ID consulted, appreciate recommendations  - Ortho surgery consulted, appreciate recommendations  Abx timeline:  CTX: 8/15, 8/17- 8/22 Daptomycin: 8/16- 8/21 IV flagyl: 8/16-8/18 PO flagyl: 8/18- 8/22 Amp-sulbactam 8/22- PO doxycycline: 8/22-  Pressure ulcer Chronic stage IV pressure ulcer on left buttock, stage III and stage II ulcers present in sacral region.  Additional wound on dorsum of right foot noted. Wound care consulted, topical wound care orders in place. No further needs from WOC at this time, bedside nursing to perform wound care. -  Wound care per bedside nursing  Constipation due to opioid therapy S/P Ortho surgery.  Opioids for pain control PRN - Start MiraLAX and senna as needed  Anemia following surgery Hemoglobin 7.0 following surgery.  Prior to surgery hemoglobin was 9.8. - 2 units given intraoperatively - Recheck hemoglobin 1900 - Transfuse if less than 7  Anxiety Patient anxious about undergoing surgery. - Prescribed hydroxyzine 25 mg TID PRN  Prolonged Q-T interval on ECG On flecainide for PVC, per cardiology. Curbsided cardiology who advised to monitor QTc on telemetry, difficult to read on his EKG. On tele this AM QTc 460s. - Monitor QTc on tele -- Optimize electrolytes K>4, Mg >2 - Electrolytes have been stable.   FEN/GI: NPO for Ortho surgery PPx: Lovenox Dispo:Pending PT recommendations  pending clinical improvement . Barriers include ongoing Ortho intervention.   Subjective:  Patient resting comfortably, no acute complaints.  No acute events overnight.  Patient denies being in pain.  He is concerned about his mobility after surgery.  He is agreeable to work with PT after operative course is complete.  Objective: Temp:  [97.6 F (36.4 C)-98.9 F (37.2 C)] 98.1 F (36.7 C) (08/25 1117) Pulse Rate:  [76-98] 82 (08/25 1117) Resp:  [16-18] 17 (08/25 1117) BP: (113-128)/(70-78) 128/77 (08/25 1117) SpO2:  [94 %-97 %] 95 % (08/25 1117) Physical Exam: General: Chronically ill-appearing, no acute distress Cardiovascular: Regular rate, regular rhythm, no murmurs appreciated on exam Respiratory: Clear, no consolidations, no crackles, no wheezing.  No increased work of breathing on exam Abdomen: Soft, nontender, nondistended Extremities: No peripheral edema  Laboratory: Most recent CBC Lab Results  Component Value Date  WBC 18.9 (H) 09/13/2021   HGB 7.0 (L) 09/13/2021   HCT 21.7 (L) 09/13/2021   MCV 80.4 09/13/2021   PLT 443 (H) 09/13/2021   Most recent BMP    Latest Ref Rng & Units  09/13/2021   12:54 AM  BMP  Glucose 70 - 99 mg/dL 185   BUN 6 - 20 mg/dL 12   Creatinine 6.31 - 1.24 mg/dL 4.97   Sodium 026 - 378 mmol/L 138   Potassium 3.5 - 5.1 mmol/L 4.1   Chloride 98 - 111 mmol/L 109   CO2 22 - 32 mmol/L 23   Calcium 8.9 - 10.3 mg/dL 7.7     Anthony Chard, DO 09/13/2021, 12:03 PM  PGY-1, [facility] FPTS Intern pager: [phone], text pages welcome Secure chat group [facility] Teaching Service

## 2021-09-13 NOTE — Anesthesia Preprocedure Evaluation (Addendum)
Anesthesia Evaluation  Patient identified by MRN, date of birth, ID band Patient awake    Reviewed: Allergy & Precautions, NPO status , Patient's Chart, lab work & pertinent test results  History of Anesthesia Complications Negative for: history of anesthetic complications  Airway Mallampati: II  TM Distance: >3 FB Neck ROM: Full    Dental  (+) Poor Dentition, Missing   Pulmonary former smoker,    Pulmonary exam normal        Cardiovascular hypertension, Pt. on medications Normal cardiovascular exam     Neuro/Psych Anxiety Spina bifida, wheelchair bound    GI/Hepatic negative GI ROS, Neg liver ROS,   Endo/Other  negative endocrine ROS  Renal/GU negative Renal ROS   Suprapubic catheter    Musculoskeletal Septic Left Hip   Abdominal   Peds  Hematology  (+) Blood dyscrasia (Hgb 7.0), anemia ,   Anesthesia Other Findings Day of surgery medications reviewed with patient.  Reproductive/Obstetrics negative OB ROS                            Anesthesia Physical Anesthesia Plan  ASA: 3  Anesthesia Plan: General   Post-op Pain Management: Tylenol PO (pre-op)*   Induction: Intravenous  PONV Risk Score and Plan: 2 and Treatment may vary due to age or medical condition, Midazolam, Ondansetron and Dexamethasone  Airway Management Planned: Oral ETT  Additional Equipment: None  Intra-op Plan:   Post-operative Plan: Extubation in OR  Informed Consent: I have reviewed the patients History and Physical, chart, labs and discussed the procedure including the risks, benefits and alternatives for the proposed anesthesia with the patient or authorized representative who has indicated his/her understanding and acceptance.     Dental advisory given  Plan Discussed with: CRNA  Anesthesia Plan Comments:        Anesthesia Quick Evaluation

## 2021-09-13 NOTE — Progress Notes (Addendum)
Report called to 5N RN. Pt to transfer to room 5N28 after PACU. Pt's belongings bags and WC taken to 5N

## 2021-09-14 ENCOUNTER — Encounter (HOSPITAL_COMMUNITY): Payer: Self-pay | Admitting: Orthopedic Surgery

## 2021-09-14 DIAGNOSIS — D649 Anemia, unspecified: Secondary | ICD-10-CM | POA: Diagnosis not present

## 2021-09-14 DIAGNOSIS — M009 Pyogenic arthritis, unspecified: Secondary | ICD-10-CM | POA: Diagnosis not present

## 2021-09-14 LAB — AEROBIC/ANAEROBIC CULTURE W GRAM STAIN (SURGICAL/DEEP WOUND)

## 2021-09-14 LAB — CBC
HCT: 20.5 % — ABNORMAL LOW (ref 39.0–52.0)
Hemoglobin: 6.8 g/dL — CL (ref 13.0–17.0)
MCH: 27.6 pg (ref 26.0–34.0)
MCHC: 33.2 g/dL (ref 30.0–36.0)
MCV: 83.3 fL (ref 80.0–100.0)
Platelets: 624 10*3/uL — ABNORMAL HIGH (ref 150–400)
RBC: 2.46 MIL/uL — ABNORMAL LOW (ref 4.22–5.81)
RDW: 15.6 % — ABNORMAL HIGH (ref 11.5–15.5)
WBC: 28.1 10*3/uL — ABNORMAL HIGH (ref 4.0–10.5)
nRBC: 0.2 % (ref 0.0–0.2)

## 2021-09-14 LAB — PREPARE RBC (CROSSMATCH)

## 2021-09-14 MED ORDER — SODIUM CHLORIDE 0.9 % IV BOLUS
1000.0000 mL | Freq: Once | INTRAVENOUS | Status: AC
  Administered 2021-09-14: 1000 mL via INTRAVENOUS

## 2021-09-14 MED ORDER — SODIUM CHLORIDE 0.9% IV SOLUTION: Freq: Once | INTRAVENOUS | Status: DC

## 2021-09-14 NOTE — Progress Notes (Signed)
Daily Progress Note Intern Pager: [phone]  Patient name: Anthony [name] Medical record number: [id number] Date of birth: 16-May-1970 Age: 51 y.o. Gender: male  Primary Care Provider: Erick Alley, DO Consultants: ortho, ID Code Status: FULL  Pt Overview and Major Events to Date:  8/15: Admitted for sepsis 8/16: Septic shock, transfered to ICU. Started on pressors 8/18: Off pressors, transferred back to FMTS 8/23: s/p removal of left hip femoral head 8/25: Planned left hip debridement  Assessment and Plan: Anthony [name] is a 51 y.o. male with PMHx of paraplegia from spina bifida who was admitted for septic L hip arthritis, complicated by septic shock warranting ICU visit, now back on floor and s/p surgical debridement with further plans for repeat debridement.  * Septic arthritis of hip (HCC) S/P removal of left femoral head 8/22. Op note detailed black necrotic femoral head, acetabulum, and ischial spine. Patient was afebrile overnight. Continued on antibiotics.  Patient taken back to the OR today for wound debridement. - Scheduled Tylenol, 5 mg Oxy as needed for pain control - Wound cultures: Enterococcus facialis - [name] showed vegetation on the mitral valve.  6 weeks IV antibiotics - ID consulted, appreciate recommendations  - Ortho surgery consulted, appreciate recommendations  Abx timeline:  CTX: 8/15, 8/17- 8/22 Daptomycin: 8/16- 8/21 IV flagyl: 8/16-8/18 PO flagyl: 8/18- 8/22 Amp-sulbactam 8/22- PO doxycycline: 8/22-  Pressure ulcer Chronic stage IV pressure ulcer on left buttock, stage III and stage II ulcers present in sacral region.  Additional wound on dorsum of right foot noted. Wound care consulted, topical wound care orders in place. No further needs from WOC at this time, bedside nursing to perform wound care. - Wound care per bedside nursing  Constipation due to opioid therapy S/P Ortho surgery.  Opioids for pain control PRN - Start MiraLAX and senna as  needed  Anemia following surgery Hemoglobin dropped from 9.5 to 6.8 today with associated hypotension, dizziness. Given 2 units PRBC - continue trending Hb - Transfuse if less than 7  Anxiety Patient anxious about undergoing surgery. - Prescribed hydroxyzine 25 mg TID PRN  Prolonged Q-T interval on ECG On flecainide for PVC, per cardiology. Curbsided cardiology who advised to monitor QTc on telemetry, difficult to read on his EKG. On tele this AM QTc 460s. - Monitor QTc on tele -- Optimize electrolytes K>4, Mg >2 - Electrolytes have been stable.   FEN/GI: regular diet, PRN senna, miralax, bisacodyl, chlorperazine, ondansetron, metoclopramide, famotidine, scheduled docusate PPx: lovenox Dispo:Pending PT recommendations  pending clinical improvement . Barriers include further surgical intervention.   Subjective:  Patient felt some dizziness earlier due to seeing all the blood from malfunctioning wound vac. He denies chest pain, shortness of breath, or abdominal pain at this time.  Objective: Temp:  [97.3 F (36.3 C)-98.6 F (37 C)] 98.3 F (36.8 C) (08/25 2100) Pulse Rate:  [75-88] 88 (08/25 1530) Resp:  [16-25] 18 (08/25 1530) BP: (73-128)/(52-91) 73/52 (08/26 0036) SpO2:  [92 %-99 %] 92 % (08/25 1530) Physical Exam: General: well-appearing and in no acute distress HEENT: normocephalic and atraumatic Cardiovascular: regular rate Respiratory: CTAB anteriorly, normal respiratory effort, and on RA Gastrointestinal: non-tender and non-distended Extremities:  moving upper extremities spontaneously Neuro: following commands and no focal neurological deficits   Laboratory: Most recent CBC Lab Results  Component Value Date   WBC 28.1 (H) 09/14/2021   HGB 6.8 (LL) 09/14/2021   HCT 20.5 (L) 09/14/2021   MCV 83.3 09/14/2021   PLT 624 (  H) 09/14/2021   Most recent BMP    Latest Ref Rng & Units 09/13/2021   12:54 AM  BMP  Glucose 70 - 99 mg/dL 502   BUN 6 - 20 mg/dL 12    Creatinine 7.74 - 1.24 mg/dL 1.28   Sodium 786 - 767 mmol/L 138   Potassium 3.5 - 5.1 mmol/L 4.1   Chloride 98 - 111 mmol/L 109   CO2 22 - 32 mmol/L 23   Calcium 8.9 - 10.3 mg/dL 7.7     Imaging/Diagnostic Tests: No new imaging to report  Augusto Gamble, MD 09/14/2021, 3:24 AM  PGY-1, [facility] FPTS Intern pager: [phone], text pages welcome Secure chat group [facility] Teaching Service

## 2021-09-14 NOTE — Progress Notes (Signed)
At around 2345 had to change the cannister because it was full and found the patient's wound was filled with clot. Charge nurse informed,called Rapid Response Team, got suggestion to reinforce the dressing till morning. Reached out to the [employer] who came to check out the patient and reinforced the dressing with Tegaderm and changed the wound vac setting to continuous, the wound vac suctioning but still showing leakage.

## 2021-09-14 NOTE — Progress Notes (Signed)
Patient ID: Anthony [name], male   DOB: Jan 30, 1970, 51 y.o.   MRN: [id number] Wound VAC drape and tubing changed.  There is a good suction foot.  Wound VAC set to the [name] continuous suction setting.  Plan for return to the operating room on Wednesday.  Hemoglobin dropped to 6.8 patient to receive 2 units of packed red blood cells.  White cell count elevated at 28.1.

## 2021-09-14 NOTE — Progress Notes (Signed)
Patient had his wound vac beeping because of blockage. Day nurse trying to reach out the doctor for a wound care consult.got the order for consult to wound, ostomy from Dr Anner Crete at 2036. The wound care nurse Jacki Cones said that the wound care nurses will not be here until Monday morning so will be wait for an order from Dr. [name].

## 2021-09-14 NOTE — H&P (View-Only) (Signed)
Patient ID: Anthony [name], male   DOB: 06/26/1970, 50 y.o.   MRN: [id number] Wound VAC drape and tubing changed.  There is a good suction foot.  Wound VAC set to the [name] continuous suction setting.  Plan for return to the operating room on Wednesday.  Hemoglobin dropped to 6.8 patient to receive 2 units of packed red blood cells.  White cell count elevated at 28.1. 

## 2021-09-14 NOTE — Progress Notes (Signed)
FMTS Brief Progress Note  S: went to see patient after getting page about hypotension. Patient says he was just feeling anxious after seeing so much blood coming out of his wound vac (which was malfunctioning but was repaired by ortho). 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  O: BP (!) 73/52 (BP Location: Left Arm)   Pulse 88   Temp 98.3 F (36.8 C) (Oral)   Resp 18   Ht 5\' 8"  (1.727 m)   Wt 87.7 kg   SpO2 92%   BMI 29.40 kg/m     A/P: Patient doing well at this time. Will continue to follow BP and trend Hb. - Orders reviewed. Labs for AM ordered, which was adjusted as needed.   , MD 09/14/2021, 3:10 AM PGY-1, [facility] Night Resident  Please page [phone] with questions.

## 2021-09-14 NOTE — Evaluation (Signed)
Physical Therapy Re-Evaluation Patient Details Name: Anthony [name] MRN: [id number] DOB: [birth year] Today's Date: 09/14/2021  History of Present Illness  Pt is a 51 y/o male admitted secondary to sepsis likely from chronic wounds. Pt with necrotic L femur and acetabulum.On 8/24, pt underwent Girdlestone amputation left hip with partial excision of the acetabulum, I&D with multiple organisms noted. Likely planned staged I&D of L hip during admission. PMH includes  spina bifida and paraplegia, neurogenic bladder with suprapubic catheter, complicated by chronic sacral wounds, chronic hip wound.  Clinical Impression  Pt re-evaluated s/p procedure listed above. Pt reports fair pain control. Overall, he states he has little interest in participating in PT and he is self sufficient at baseline. Explained effects of immobility and importance of strengthening to maintain muscle mass; pt reports not wanting to attempt out of bed until the surgeries are concluded. Pt able to demonstrate ability to progress to long sitting position modI with use of bilateral hand rails. Worked on core strengthening as listed below. Will re-evaluate after Wednesday procedure, per pt wishes. Pt would benefit from SNF for wound care, however, is likely to refuse     Recommendations for follow up therapy are one component of a multi-disciplinary discharge planning process, led by the attending physician.  Recommendations may be updated based on patient status, additional functional criteria and insurance authorization.  Follow Up Recommendations Skilled nursing-short term rehab (<3 hours/day) Can patient physically be transported by private vehicle: No    Assistance Recommended at Discharge Intermittent Supervision/Assistance  Patient can return home with the following  A little help with walking and/or transfers;A little help with bathing/dressing/bathroom;Assistance with cooking/housework;Assist for transportation;Help with  stairs or ramp for entrance    Equipment Recommendations None recommended by PT  Recommendations for Other Services       Functional Status Assessment Patient has had a recent decline in their functional status and demonstrates the ability to make significant improvements in function in a reasonable and predictable amount of time.     Precautions / Restrictions Precautions Precautions: Fall Precaution Comments: wound vac, hx of paraplegia, suprapubic catheter Restrictions Weight Bearing Restrictions: Yes RLE Weight Bearing: Non weight bearing LLE Weight Bearing: Non weight bearing      Mobility  Bed Mobility Overal bed mobility: Needs Assistance Bed Mobility: Supine to Sit     Supine to sit: Modified independent (Device/Increase time)     General bed mobility comments: ModI to progress from supine to long sitting and use of BUE support of hand rails    Transfers                        Ambulation/Gait                  Stairs            Wheelchair Mobility    Modified Rankin (Stroke Patients Only)       Balance Overall balance assessment: Needs assistance Sitting-balance support: No upper extremity supported, Feet unsupported Sitting balance-Leahy Scale: Fair                                       Pertinent Vitals/Pain Pain Assessment Pain Assessment: Faces Faces Pain Scale: Hurts a little bit Pain Location: L hip Pain Descriptors / Indicators: Grimacing Pain Intervention(s): Monitored during session    Home Living Family/patient expects to be discharged  to:: Private residence Living Arrangements: Alone Available Help at Discharge: Family;Available PRN/intermittently Type of Home: Apartment Home Access: Ramped entrance       Home Layout: One level Home Equipment: Wheelchair - manual;Grab bars - tub/shower;Transport chair      Prior Function Prior Level of Function : Independent/Modified Independent              Mobility Comments: able to perform transfers in/ouyt of his WC without difficulty ADLs Comments: sits in the tub for bathing     Hand Dominance        Extremity/Trunk Assessment   Upper Extremity Assessment Upper Extremity Assessment: Defer to OT evaluation    Lower Extremity Assessment Lower Extremity Assessment: RLE deficits/detail;LLE deficits/detail RLE Deficits / Details: No AROM at baseline secondary to spina bifida LLE Deficits / Details: No AROM at baseline secondary to spina bifida    Cervical / Trunk Assessment Cervical / Trunk Assessment: Kyphotic  Communication   Communication: No difficulties  Cognition Arousal/Alertness: Awake/alert Behavior During Therapy: WFL for tasks assessed/performed Overall Cognitive Status: No family/caregiver present to determine baseline cognitive functioning                                 General Comments: decreased engagement in therapy importance        General Comments      Exercises Other Exercises Other Exercises: Modified sit ups x 10, pulling up on bed rails with BUE's Other Exercises: Long sitting: [place] twists x 20, "ball" toss x 20, functional reaching with BUE's in all planes, bilateral shoulder flexion x 20   Assessment/Plan    PT Assessment Patient needs continued PT services  PT Problem List Decreased strength;Decreased activity tolerance;Decreased mobility;Decreased balance;Decreased safety awareness;Decreased knowledge of use of DME;Decreased knowledge of precautions       PT Treatment Interventions DME instruction;Functional mobility training;Therapeutic activities;Therapeutic exercise;Balance training;Patient/family education;Wheelchair mobility training    PT Goals (Current goals can be found in the Care Plan section)  Acute Rehab PT Goals Patient Stated Goal: to go home PT Goal Formulation: With patient Time For Goal Achievement: 09/28/21 Potential to Achieve Goals:  Fair    Frequency Min 2X/week     Co-evaluation               AM-PAC PT "6 Clicks" Mobility  Outcome Measure Help needed turning from your back to your side while in a flat bed without using bedrails?: A Little Help needed moving from lying on your back to sitting on the side of a flat bed without using bedrails?: A Little Help needed moving to and from a bed to a chair (including a wheelchair)?: A Little Help needed standing up from a chair using your arms (e.g., wheelchair or bedside chair)?: Total Help needed to walk in hospital room?: Total Help needed climbing 3-5 steps with a railing? : Total 6 Click Score: 12    End of Session   Activity Tolerance: Patient tolerated treatment well Patient left: in bed;with call bell/phone within reach Nurse Communication: Mobility status PT Visit Diagnosis: Muscle weakness (generalized) (M62.81);Other abnormalities of gait and mobility (R26.89)    Time: 6433-2951 PT Time Calculation (min) (ACUTE ONLY): 18 min   Charges:   PT Evaluation $PT Re-evaluation: 1 Re-eval          Lillia Pauls, PT, DPT Acute Rehabilitation Services Office [phone]   Anthony [name] 09/14/2021, 4:49 PM

## 2021-09-14 NOTE — Progress Notes (Signed)
Patient complaining of SOB and lightheadedness, BP 73/52 MAP 59, pulse 100, informed FM Dr. Anner Crete, started 1000 ml NS bolus, patient states he feels fine after the bolus. Will continue to monitor the patient.

## 2021-09-14 NOTE — Progress Notes (Signed)
Call blood bank again, blood not available yet. Got the information that the blood has arrived to the [facility] and might reach the hospital at around 0700 and be available to transfuse at 0730 after crossmatch.

## 2021-09-14 NOTE — Progress Notes (Signed)
Patient evaluated for wound VAC "blockage".  He underwent repeat debridement of left hip wound with Dr. [name] earlier today.  Reports he is doing well tonight with no dizziness or lightheadedness upon talking to him around 12:45 AM.  Does have a significant amount of mostly clotted blood in the area of the wound VAC.  Patient states that he can feel suction from the wound VAC on his hip.  The canister and tubing attached to canister was removed and replaced.  The dressing was reinforced with Tegaderm around the periphery and around the suction seal in the center of the wound VAC.  After this, the wound VAC setting was changed from irrigation to just wound VAC.  Bloody drainage was observed traveling through from the wound VAC to the canister.  Plan to continue with this current set up through the morning as it is functional but not perfect.  Reevaluate by Dr. [name] likely this morning when he rounds on Anthony [name].

## 2021-09-14 NOTE — Progress Notes (Signed)
Got patient's critical lab value, Hb 6.8 g/dl, informed Dr. Anner Crete, got an order for blood transfusion. Got called by blood bank to tell that the blood will be available within 2 hrs. Waiting for the call from blood bank.

## 2021-09-14 NOTE — Plan of Care (Signed)

## 2021-09-15 DIAGNOSIS — M009 Pyogenic arthritis, unspecified: Secondary | ICD-10-CM | POA: Diagnosis not present

## 2021-09-15 LAB — CBC
HCT: 24 % — ABNORMAL LOW (ref 39.0–52.0)
Hemoglobin: 8.1 g/dL — ABNORMAL LOW (ref 13.0–17.0)
MCH: 28.8 pg (ref 26.0–34.0)
MCHC: 33.8 g/dL (ref 30.0–36.0)
MCV: 85.4 fL (ref 80.0–100.0)
Platelets: 468 10*3/uL — ABNORMAL HIGH (ref 150–400)
RBC: 2.81 MIL/uL — ABNORMAL LOW (ref 4.22–5.81)
RDW: 15.9 % — ABNORMAL HIGH (ref 11.5–15.5)
WBC: 17.7 10*3/uL — ABNORMAL HIGH (ref 4.0–10.5)
nRBC: 0.4 % — ABNORMAL HIGH (ref 0.0–0.2)

## 2021-09-15 LAB — PREPARE RBC (CROSSMATCH)

## 2021-09-15 MED ORDER — ACETAMINOPHEN 500 MG PO TABS
1000.0000 mg | ORAL_TABLET | Freq: Two times a day (BID) | ORAL | Status: DC | PRN
  Administered 2021-09-22 – 2021-09-24 (×2): 1000 mg via ORAL
  Filled 2021-09-15 (×3): qty 2

## 2021-09-15 MED ORDER — SODIUM CHLORIDE 0.9% IV SOLUTION: Freq: Once | INTRAVENOUS | Status: DC

## 2021-09-15 NOTE — Progress Notes (Addendum)
Daily Progress Note Intern Pager: [phone]  Patient name: Anthony [name] Medical record number: [id number] Date of birth: Oct 25, 1970 Age: 51 y.o. Gender: male  Primary Care Provider: Erick Alley, DO Consultants: Ortho, ID Code Status: Full  Pt Overview and Major Events to Date:  8/15: Admitted for sepsis 8/16: Septic shock, transfered to ICU. Started on pressors 8/18: Off pressors, transferred back to FMTS 8/22: Left hip debridement and removal of left hip femoral head 8/25: Planned left hip debridement 8/26: 2 U pRBCs transfused    Abx timeline:  CTX: 8/15, 8/17- 8/22 Daptomycin: 8/16- 8/21 IV flagyl: 8/16-8/18 PO flagyl: 8/18- 8/22 Amp-sulbactam 8/22-8/24 PO doxycycline: 8/22- current Zosyn: 8/24- current  Assessment and Plan: Anthony [name] is a 51 y.o. male with PMHx of paraplegia from spina bifida who was admitted for septic L hip arthritis, complicated by septic shock warranting ICU visit, now back on floor and s/p surgical debridement with further plans for repeat debridement.  * Septic arthritis of hip (HCC) S/P removal of left femoral head 8/22.  And had further wound debridement yesterday with plans to go back to the OR 1 more time on Wednesday, 8/30.  Wound cultures grew Enterococcus facialis. [name] showed vegetation on the mitral valve and pt will need 6 weeks IV antibiotics. Patient was afebrile overnight. WBC ** - ID consulted, appreciate recommendations  - Ortho surgery consulted, appreciate recommendations - Scheduled Tylenol, 5-10 mg Oxy as needed for pain control - Continue doxycycline 100 mg twice daily and Zosyn every 8 hours    Pressure ulcer Chronic stage IV pressure ulcer on left buttock, stage III and stage II ulcers present in sacral region.  Additional wound on dorsum of right foot noted. Wound care consulted, topical wound care orders in place. No further needs from WOC at this time, bedside nursing to perform wound care. - Wound care per  bedside nursing  Constipation due to opioid therapy S/P Ortho surgery.  Opioids for pain control PRN - Start MiraLAX and senna as needed  Anemia following surgery Hemoglobin dropped from 9.5 to 6.8 yesterday with associated hypotension, dizziness. He is now s/p 2 units PRBC. Hgb this morning pending.  - held lovenox - f/u Hgb - Transfuse if less than 7  Anxiety Patient anxious about undergoing surgery. -  hydroxyzine 25 mg TID PRN  Prolonged Q-T interval on ECG On flecainide for PVC, per cardiology. Curbsided cardiology who advised to monitor QTc on telemetry, difficult to read on his EKG. On tele this yesterday AM QTc 460s. - Monitor QTc on tele -- Optimize electrolytes K>4, Mg >2 - Electrolytes have been stable.    FEN/GI: Regular diet PPx: SCDs Dispo: Pending continued medical management  Subjective:  Patient is in good spirits this morning, no complaints or concerns.  Objective: Temp:  [98.6 F (37 C)-99.3 F (37.4 C)] 98.8 F (37.1 C) (08/27 0534) Pulse Rate:  [85-127] 94 (08/26 1840) Resp:  [0-31] 15 (08/27 0036) BP: (100-125)/(57-79) 100/57 (08/27 0534) SpO2:  [93 %-100 %] 97 % (08/26 1617) Physical Exam: General: 51 year old male, smiling, pleasant to speak with, NAD Cardiovascular: RRR, normal S1/S2 Respiratory: CTAB, normal effort Abdomen: Soft, nontender to palpation, nondistended  Laboratory: Most recent CBC Lab Results  Component Value Date   WBC 28.1 (H) 09/14/2021   HGB 6.8 (LL) 09/14/2021   HCT 20.5 (L) 09/14/2021   MCV 83.3 09/14/2021   PLT 624 (H) 09/14/2021   Most recent BMP    Latest Ref Rng &  Units 09/13/2021   12:54 AM  BMP  Glucose 70 - 99 mg/dL 102   BUN 6 - 20 mg/dL 12   Creatinine 7.25 - 1.24 mg/dL 3.66   Sodium 440 - 347 mmol/L 138   Potassium 3.5 - 5.1 mmol/L 4.1   Chloride 98 - 111 mmol/L 109   CO2 22 - 32 mmol/L 23   Calcium 8.9 - 10.3 mg/dL 7.7      Erick Alley, DO 09/15/2021, 6:52 AM  PGY-2, [place] [facility] FPTS Intern pager: [phone], text pages welcome Secure chat group [facility] Teaching Service

## 2021-09-15 NOTE — Progress Notes (Incomplete)
     Daily Progress Note Intern Pager: [phone]  Patient name: Anthony [name] Medical record number: [id number] Date of birth: 07-05-1970 Age: 51 y.o. Gender: male  Primary Care Provider: Erick Alley, DO Consultants: *** Code Status: ***  Pt Overview and Major Events to Date:  ***  Assessment and Plan:  ***. Pertinent PMH/PSH includes ***.  * Septic arthritis of hip (HCC) S/P removal of left femoral head 8/22. Op note detailed black necrotic femoral head, acetabulum, and ischial spine. Patient was afebrile overnight. Continued on antibiotics.  Patient taken back to the OR today for wound debridement. - Scheduled Tylenol, 5 mg Oxy as needed for pain control - Wound cultures: Enterococcus facialis - [name] showed vegetation on the mitral valve.  6 weeks IV antibiotics - ID consulted, appreciate recommendations  - Ortho surgery consulted, appreciate recommendations  Abx timeline:  CTX: 8/15, 8/17- 8/22 Daptomycin: 8/16- 8/21 IV flagyl: 8/16-8/18 PO flagyl: 8/18- 8/22 Amp-sulbactam 8/22- PO doxycycline: 8/22-  Pressure ulcer Chronic stage IV pressure ulcer on left buttock, stage III and stage II ulcers present in sacral region.  Additional wound on dorsum of right foot noted. Wound care consulted, topical wound care orders in place. No further needs from WOC at this time, bedside nursing to perform wound care. - Wound care per bedside nursing  Constipation due to opioid therapy S/P Ortho surgery.  Opioids for pain control PRN - Start MiraLAX and senna as needed  Anemia following surgery Hemoglobin dropped from 9.5 to 6.8 today with associated hypotension, dizziness. Given 2 units PRBC - held lovenox - f/u Hb - Transfuse if less than 7  Anxiety Patient anxious about undergoing surgery. - Prescribed hydroxyzine 25 mg TID PRN  Prolonged Q-T interval on ECG On flecainide for PVC, per cardiology. Curbsided cardiology who advised to monitor QTc on telemetry, difficult to read  on his EKG. On tele this AM QTc 460s. - Monitor QTc on tele -- Optimize electrolytes K>4, Mg >2 - Electrolytes have been stable.    FEN/GI: *** PPx: *** Dispo:{FPTSDISOLIST:27587} {FPTSDISOTIME:27588}. Barriers include ***.   Subjective:  ***  Objective: Temp:  [98.2 F (36.8 C)-99.3 F (37.4 C)] 99.1 F (37.3 C) (08/26 2126) Pulse Rate:  [85-127] 94 (08/26 1840) Resp:  [0-31] 26 (08/26 1840) BP: (73-125)/(52-77) 114/58 (08/26 1840) SpO2:  [93 %-100 %] 97 % (08/26 1617) Physical Exam: General: *** Cardiovascular: *** Respiratory: *** Abdomen: *** Extremities: ***  Laboratory: Most recent CBC Lab Results  Component Value Date   WBC 28.1 (H) 09/14/2021   HGB 6.8 (LL) 09/14/2021   HCT 20.5 (L) 09/14/2021   MCV 83.3 09/14/2021   PLT 624 (H) 09/14/2021   Most recent BMP    Latest Ref Rng & Units 09/13/2021   12:54 AM  BMP  Glucose 70 - 99 mg/dL 379   BUN 6 - 20 mg/dL 12   Creatinine 0.24 - 1.24 mg/dL 0.97   Sodium 353 - 299 mmol/L 138   Potassium 3.5 - 5.1 mmol/L 4.1   Chloride 98 - 111 mmol/L 109   CO2 22 - 32 mmol/L 23   Calcium 8.9 - 10.3 mg/dL 7.7      Erick Alley, DO 09/15/2021, 12:03 AM  PGY-2,  Family Medicine FPTS Intern pager: [phone], text pages welcome Secure chat group [facility] Teaching Service

## 2021-09-15 NOTE — Progress Notes (Signed)
Patient stable.  Pain controlled. Wound VAC functional left hip with serous exudate about 50 cc this morning Plan for return to OR next Wednesday with Dr. [name]

## 2021-09-15 NOTE — Plan of Care (Signed)

## 2021-09-16 DIAGNOSIS — M009 Pyogenic arthritis, unspecified: Secondary | ICD-10-CM | POA: Diagnosis not present

## 2021-09-16 DIAGNOSIS — L89522 Pressure ulcer of left ankle, stage 2: Secondary | ICD-10-CM | POA: Diagnosis not present

## 2021-09-16 DIAGNOSIS — R7881 Bacteremia: Secondary | ICD-10-CM | POA: Diagnosis not present

## 2021-09-16 DIAGNOSIS — I059 Rheumatic mitral valve disease, unspecified: Secondary | ICD-10-CM | POA: Diagnosis not present

## 2021-09-16 DIAGNOSIS — M869 Osteomyelitis, unspecified: Secondary | ICD-10-CM | POA: Diagnosis not present

## 2021-09-16 LAB — CBC
HCT: 24.5 % — ABNORMAL LOW (ref 39.0–52.0)
Hemoglobin: 7.9 g/dL — ABNORMAL LOW (ref 13.0–17.0)
MCH: 28.2 pg (ref 26.0–34.0)
MCHC: 32.2 g/dL (ref 30.0–36.0)
MCV: 87.5 fL (ref 80.0–100.0)
Platelets: 488 10*3/uL — ABNORMAL HIGH (ref 150–400)
RBC: 2.8 MIL/uL — ABNORMAL LOW (ref 4.22–5.81)
RDW: 16.9 % — ABNORMAL HIGH (ref 11.5–15.5)
WBC: 16.8 10*3/uL — ABNORMAL HIGH (ref 4.0–10.5)
nRBC: 0.3 % — ABNORMAL HIGH (ref 0.0–0.2)

## 2021-09-16 LAB — BASIC METABOLIC PANEL
Anion gap: 5 (ref 5–15)
BUN: 9 mg/dL (ref 6–20)
CO2: 23 mmol/L (ref 22–32)
Calcium: 7.5 mg/dL — ABNORMAL LOW (ref 8.9–10.3)
Chloride: 108 mmol/L (ref 98–111)
Creatinine, Ser: 0.56 mg/dL — ABNORMAL LOW (ref 0.61–1.24)
GFR, Estimated: >60 mL/min (ref >60)
Glucose, Bld: 97 mg/dL (ref 70–99)
Potassium: 3.6 mmol/L (ref 3.5–5.1)
Sodium: 136 mmol/L (ref 135–145)

## 2021-09-16 MED ORDER — SODIUM CHLORIDE 0.9 % IV SOLN
3.0000 g | Freq: Four times a day (QID) | INTRAVENOUS | Status: DC
  Administered 2021-09-16 – 2021-09-17 (×4): 3 g via INTRAVENOUS
  Filled 2021-09-16 (×4): qty 8

## 2021-09-16 NOTE — Progress Notes (Signed)
[facility]  Date of Admission:  09/03/2021           Reason for visit: Follow up on septic arthritis of the hip  Current antibiotics: Doxycycline Zosyn      ASSESSMENT:    51 y.o. male admitted with:  Left hip septic arthritis: Status post I&D 8/23 and 8/25.  Planning for repeat OR with Dr. [name] on Wednesday 8/30.  OR cultures from 8/23 notable for Enterococcus faecalis and Bacteroides. Strep anginosus bloodstream infection: Admission blood cultures 8/15 positive with repeat blood cultures 8/18 no growth.  [name] 8/21 showed mitral valve vegetation. 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   Endocarditis of mitral valve   Constipation due to opioid therapy   Anemia following surgery    MEDICATIONS:    Scheduled Meds:  sodium chloride   Intravenous Once   sodium chloride   Intravenous Once   Chlorhexidine Gluconate Cloth  6 each Topical Daily   doxycycline  100 mg Oral Q12H   flecainide  50 mg Oral BID   rosuvastatin  40 mg Oral Daily   Continuous Infusions:  sodium chloride 10 mL/hr at 09/13/21 1133   sodium chloride 10 mL/hr at 09/14/21 1545   methocarbamol (ROBAXIN) IV     piperacillin-tazobactam (ZOSYN)  IV 3.375 g (09/16/21 0508)   PRN Meds:.acetaminophen, bisacodyl, famotidine, [COMPLETED] hydrOXYzine **FOLLOWED BY** hydrOXYzine, ibuprofen, lip balm, loratadine, methocarbamol **OR** methocarbamol  (ROBAXIN) IV, metoCLOPramide **OR** metoCLOPramide (REGLAN) injection, ondansetron **OR** ondansetron (ZOFRAN) IV, mouth rinse, oxyCODONE **OR** oxyCODONE, polyethylene glycol, prochlorperazine, senna-docusate  SUBJECTIVE:   24 hour events:  No acute events noted overnight Improving leukocytosis Afebrile No new imaging No new cultures  Patient with no new complaint today.  Reports going back to the OR on Wednesday.  Would prefer less frequent lab draws if possible.  Review of Systems  All other systems reviewed and are negative.     OBJECTIVE:   Blood pressure 110/67, pulse 91, temperature 98.8 F (37.1 C), temperature source Oral, resp. rate 19, height 5\' 8"  (1.727 m), weight 87.7 kg, SpO2 97 %. Body mass index is 29.4 kg/m.  Physical Exam Constitutional:      General: He is not in acute distress.    Appearance: Normal appearance.  HENT:     Head: Normocephalic and atraumatic.  Eyes:     Extraocular Movements: Extraocular movements intact.     Conjunctiva/sclera: Conjunctivae normal.  Pulmonary:     Effort: Pulmonary effort is normal. No respiratory distress.  Musculoskeletal:     Cervical back: Normal range of motion and neck supple.  Neurological:     General: No focal deficit present.     Mental Status: He is alert and oriented to person, place, and time.  Psychiatric:        Mood and Affect: Mood normal.        Behavior: Behavior normal.  Lab Results: Lab Results  Component Value Date   WBC 16.8 (H) 09/16/2021   HGB 7.9 (L) 09/16/2021   HCT 24.5 (L) 09/16/2021   MCV 87.5 09/16/2021   PLT 488 (H) 09/16/2021    Lab Results  Component Value Date   NA 136 09/16/2021   K 3.6 09/16/2021   CO2 23 09/16/2021   GLUCOSE 97 09/16/2021   BUN 9 09/16/2021   CREATININE 0.56 (L) 09/16/2021   CALCIUM 7.5 (L) 09/16/2021   GFRNONAA >60 09/16/2021   GFRAA >60 01/31/2017    Lab Results  Component Value Date   ALT 40 09/07/2021   AST 38 09/07/2021    ALKPHOS 310 (H) 09/07/2021   BILITOT 0.4 09/07/2021    No results found for: "CRP"  No results found for: "ESRSEDRATE"   I have reviewed the micro and lab results in Epic.  Imaging: No results found.   Imaging independently reviewed in Epic.    Vedia Coffer for Infectious Disease [facility] [phone] pager 09/16/2021, 8:25 AM

## 2021-09-16 NOTE — Progress Notes (Signed)
OT Cancellation Note  Patient Details Name: Anthony [name] MRN: [id number] DOB: 09-18-1970   Cancelled Treatment:    Reason Eval/Treat Not Completed: Patient declined, no reason specified Pt declined to work with OT today. Requested therapy follow up on Thursday after Wednesday's procedure. Pt still inquiring about when he can receive new wheelchair/cushion to ensure it is the optimal setup for him. CM/SW messaged.   Lorre Munroe 09/16/2021, 10:05 AM

## 2021-09-16 NOTE — TOC Progression Note (Addendum)
Transition of Care [facility]) - Progression Note    Patient Details  Name: Anthony [name] MRN: [id number] Date of Birth: December 30, 1970  Transition of Care [facility] CM/SW Contact  Bess Kinds, RN Phone Number: [phone] 09/16/2021, 12:44 PM  Clinical Narrative:     Spoke with patient at the bedside to discuss questions about new wheelchair cushion stating he needed specific type. Call to Zach at [employer] to discuss patient's needs, pending f/u.   UPDATE: Spoke with patient at the bedside to discuss new wheelchair needs. Advised of PT recommendations for high strength light weight flexible wheelchair with thick roho cushion stating 18 x 16.   Patient stated that he thought he had talked to someone from [facility] a few days ago, but when this RNCM called [facility] [facility] there was no record of chair in process. Discussed if he had a preference with who provides new chair - no preference. Advised that DME needs are determined at discharge d/t Medicare guidelines. Patient needs discussed with Ian Malkin at [employer] - TOC to f/u closer to discharge.   Discussed transportation home. Patient stated that his mother will pick him up in private vehicle stating that he is able to get in/out of her car.   TOC following for transition needs.   Expected Discharge Plan: Skilled Nursing Facility Barriers to Discharge: SNF Pending bed offer, Continued Medical Work up  Expected Discharge Plan and Services Expected Discharge Plan: Skilled Nursing Facility   Discharge Planning Services: CM Consult Post Acute Care Choice: Home Health Living arrangements for the past 2 months: Apartment                 DME Arranged: Government social research officer DME Agency: [employer] Date DME Agency Contacted: 09/05/21 Time DME Agency Contacted: 1410 Representative spoke with at DME Agency: Arnold Long HH Arranged: PT, RN HH Agency: [facility] Date [facility] Agency Contacted: 09/05/21 Time HH Agency Contacted: 1300 Representative spoke with at  [facility] Agency: Candise Bowens   Social Determinants of Health (SDOH) Interventions    Readmission Risk Interventions     No data to display

## 2021-09-16 NOTE — Progress Notes (Signed)
Daily Progress Note Intern Pager: [phone]  Patient name: Anthony [name] Medical record number: [id number] Date of birth: 1970/07/12 Age: 51 y.o. Gender: male  Primary Care Provider: Erick Alley, DO Consultants: Ortho, ID Code Status: FULL  Pt Overview and Major Events to Date:  8/15: Admitted for sepsis 8/16: Septic shock, transfered to ICU. Started on pressors 8/18: Off pressors, transferred back to FMTS 8/22: Left hip debridement and removal of left hip femoral head 8/25: Planned left hip debridement 8/26: 2 U pRBCs transfused  Assessment and Plan: Anthony [name] is a 51 y.o. male with PMHx of paraplegia from spina bifida who was admitted for septic L hip arthritis, complicated by septic shock warranting ICU visit, now back on floor and s/p surgical debridement with further plans for repeat debridement.  * Septic arthritis of hip (HCC) S/P removal of left femoral head 8/22.  And had further wound debridement yesterday with plans to go back to the OR 1 more time on Wednesday, 8/30.  Wound cultures grew Enterococcus facialis. [name] showed vegetation on the mitral valve and pt will need 6 weeks IV antibiotics. Patient was afebrile overnight. WBC ** - ID consulted, appreciate recommendations  - Ortho surgery consulted, appreciate recommendations - Scheduled Tylenol, 5-10 mg Oxy as needed for pain control - Continue doxycycline 100 mg twice daily and Zosyn every 8 hours  Pressure ulcer Chronic stage IV pressure ulcer on left buttock, stage III and stage II ulcers present in sacral region.  Additional wound on dorsum of right foot noted. Wound care consulted, topical wound care orders in place. No further needs from WOC at this time, bedside nursing to perform wound care. - Wound care per bedside nursing  Constipation due to opioid therapy S/P Ortho surgery.  Opioids for pain control PRN - Start MiraLAX and senna as needed  Anemia following surgery Hemoglobin dropped from 9.5  to 6.8 yesterday with associated hypotension, dizziness. He is now s/p 2 units PRBC. Hgb this morning pending.  - held lovenox - f/u Hgb - Transfuse if less than 7  Anxiety Patient anxious about undergoing surgery. -  hydroxyzine 25 mg TID PRN  Prolonged Q-T interval on ECG On flecainide for PVC, per cardiology. Curbsided cardiology who advised to monitor QTc on telemetry, difficult to read on his EKG. On tele this yesterday AM QTc 460s. - Monitor QTc on tele -- Optimize electrolytes K>4, Mg >2 - Electrolytes have been stable.   FEN/GI: regular diet PPx: SCDs Dispo:pending clinical improvement   Subjective:  Patient in good spirits. Had questions about infection and antibiotics, also would like to speak to OT  Objective: Temp:  [98.8 F (37.1 C)-99.5 F (37.5 C)] 98.8 F (37.1 C) (08/28 0510) Pulse Rate:  [91] 91 (08/27 2241) Resp:  [19] 19 (08/27 2241) BP: (117-118)/(45-68) 118/68 (08/28 0600) SpO2:  [97 %] 97 % (08/27 2241) Physical Exam: General: well-appearing and in no acute distress HEENT: normocephalic and atraumatic Cardiovascular: regular rate Respiratory: CTAB anteriorly, normal respiratory effort, and on RA Gastrointestinal: non-tender and non-distended Extremities:  moving bilateral UE spontaneously Neuro: following commands and no focal neurological deficits   Laboratory: Most recent CBC Lab Results  Component Value Date   WBC 16.8 (H) 09/16/2021   HGB 7.9 (L) 09/16/2021   HCT 24.5 (L) 09/16/2021   MCV 87.5 09/16/2021   PLT 488 (H) 09/16/2021   Most recent BMP    Latest Ref Rng & Units 09/16/2021    2:03 AM  BMP  Glucose 70 - 99 mg/dL 97   BUN 6 - 20 mg/dL 9   Creatinine 8.11 - 0.31 mg/dL 5.94   Sodium 585 - 929 mmol/L 136   Potassium 3.5 - 5.1 mmol/L 3.6   Chloride 98 - 111 mmol/L 108   CO2 22 - 32 mmol/L 23   Calcium 8.9 - 10.3 mg/dL 7.5     Imaging/Diagnostic Tests: No new imaging  Augusto Gamble, MD 09/16/2021, 7:43 AM  PGY-1, [facility] FPTS Intern pager: [phone], text pages welcome Secure chat group [facility] Teaching Service

## 2021-09-17 DIAGNOSIS — M009 Pyogenic arthritis, unspecified: Secondary | ICD-10-CM | POA: Diagnosis not present

## 2021-09-17 DIAGNOSIS — L89522 Pressure ulcer of left ankle, stage 2: Secondary | ICD-10-CM | POA: Diagnosis not present

## 2021-09-17 DIAGNOSIS — I059 Rheumatic mitral valve disease, unspecified: Secondary | ICD-10-CM | POA: Diagnosis not present

## 2021-09-17 DIAGNOSIS — M869 Osteomyelitis, unspecified: Secondary | ICD-10-CM | POA: Diagnosis not present

## 2021-09-17 DIAGNOSIS — R7881 Bacteremia: Secondary | ICD-10-CM | POA: Diagnosis not present

## 2021-09-17 LAB — TYPE AND SCREEN
ABO/RH(D): O POS
Antibody Screen: POSITIVE
DAT, IgG: NEGATIVE
Donor AG Type: NEGATIVE
Donor AG Type: NEGATIVE
Donor AG Type: NEGATIVE
Donor AG Type: NEGATIVE
Unit division: 0
Unit division: 0
Unit division: 0
Unit division: 0
Unit division: 0
Unit division: 0

## 2021-09-17 LAB — BPAM RBC
Blood Product Expiration Date: 202309172359
Blood Product Expiration Date: 202309172359
Blood Product Expiration Date: 202309232359
Blood Product Expiration Date: 202309292359
Blood Product Expiration Date: 202310012359
Blood Product Expiration Date: 202310032359
ISSUE DATE / TIME: 202308251044
ISSUE DATE / TIME: 202308251044
ISSUE DATE / TIME: 202308261030
ISSUE DATE / TIME: 202308261530
Unit Type and Rh: 5100
Unit Type and Rh: 5100
Unit Type and Rh: 5100
Unit Type and Rh: 5100
Unit Type and Rh: 5100
Unit Type and Rh: 5100

## 2021-09-17 MED ORDER — METRONIDAZOLE 500 MG PO TABS
500.0000 mg | ORAL_TABLET | Freq: Two times a day (BID) | ORAL | Status: DC
  Administered 2021-09-17 – 2021-09-24 (×14): 500 mg via ORAL
  Filled 2021-09-17 (×15): qty 1

## 2021-09-17 MED ORDER — SODIUM CHLORIDE 0.9 % IV SOLN
2.0000 g | Freq: Two times a day (BID) | INTRAVENOUS | Status: DC
  Administered 2021-09-17 – 2021-09-24 (×14): 2 g via INTRAVENOUS
  Filled 2021-09-17 (×15): qty 20

## 2021-09-17 MED ORDER — SODIUM CHLORIDE 0.9 % IV SOLN
2.0000 g | INTRAVENOUS | Status: DC
Start: 2021-09-17 — End: 2021-09-24
  Administered 2021-09-17 – 2021-09-24 (×40): 2 g via INTRAVENOUS
  Filled 2021-09-17 (×45): qty 2000

## 2021-09-17 NOTE — Progress Notes (Addendum)
Daily Progress Note Intern Pager: [phone]  Patient name: Anthony [name] Medical record number: [id number] Date of birth: 03-25-1970 Age: 51 y.o. Gender: male  Primary Care Provider: Erick Alley, DO Consultants: ortho, ID Code Status: FULL  Pt Overview and Major Events to Date:  8/15: Admitted for sepsis 8/16: Septic shock, transfered to ICU. Started on pressors 8/18: Off pressors, transferred back to FMTS 8/22: Left hip debridement and removal of left hip femoral head 8/25: Planned left hip debridement 8/26: 2 U pRBCs transfused  Assessment and Plan: Anthony [name] is a 51 y.o. male with PMHx of paraplegia from spina bifida who was admitted for septic L hip arthritis, complicated by septic shock warranting ICU visit, now back on floor and s/p surgical debridement with further plans for repeat debridement.  * Septic arthritis of hip (HCC) S/P removal of left femoral head 8/22.  And had further wound debridement yesterday with plans to go back to the OR 1 more time on Wednesday, 8/30.  Wound cultures grew Enterococcus facialis. [name] showed vegetation on the mitral valve and pt will need 6 weeks IV antibiotics. Patient was afebrile overnight. - ID following, appreciate recommendations  - Ortho surgery consulted, appreciate recommendations - Scheduled Tylenol, 5-10 mg Oxy as needed for pain control - switched to ampicillin and ceftriaxone for possible native valve IE due to E faecalis per ID - continue abx per above  Pressure ulcer Chronic stage IV pressure ulcer on left buttock, stage III and stage II ulcers present in sacral region.  Additional wound on dorsum of right foot noted. Wound care consulted, topical wound care orders in place. No further needs from WOC at this time, bedside nursing to perform wound care. - Wound care per bedside nursing  Constipation S/P Ortho surgery.  Opioids for pain control PRN, though not taking. - Start MiraLAX and senna as needed  Anemia  following surgery Hemoglobin dropped from 9.5 to 6.8 yesterday with associated hypotension, dizziness. He is now s/p 2 units PRBC. Hgb this morning pending.  - held lovenox - f/u Hgb - Transfuse if less than 7  Anxiety Patient anxious about undergoing surgery. -  hydroxyzine 25 mg TID PRN  Prolonged Q-T interval on ECG On flecainide for PVC, per cardiology. Curbsided cardiology who advised to monitor QTc on telemetry, difficult to read on his EKG. On tele this yesterday AM QTc 460s. - Monitor QTc on tele -- Optimize electrolytes K>4, Mg >2 - Electrolytes have been stable.  FEN/GI: regular diet PPx: SCDs Dispo:Pending PT recommendations  pending clinical improvement . Barriers include ongoing treatment.   Subjective:  Patient in good spirits. He asks if we could discontinue some lines on his arm, specifically the blood pressure cuff and the oxygen probe on his finger as they are restrictive and uncomfortable. Informed patient we would discuss it over rounds.  Objective: Temp:  [98.6 F (37 C)-99.4 F (37.4 C)] 99 F (37.2 C) (08/29 0920) Pulse Rate:  [87-98] 93 (08/29 0920) Resp:  [16-18] 18 (08/29 0920) BP: (113-135)/(61-71) 118/62 (08/29 0920) SpO2:  [96 %-98 %] 97 % (08/29 0920) Physical Exam: General: well-appearing and in no acute distress HEENT: normocephalic and atraumatic Cardiovascular: regular rate Respiratory: CTAB anteriorly, normal respiratory effort, and on RA Gastrointestinal: non-tender and non-distended Extremities:  moving bilateral UE spontaneously Neuro: following commands and no focal neurological deficits  Laboratory: Most recent CBC Lab Results  Component Value Date   WBC 16.8 (H) 09/16/2021   HGB 7.9 (L)  09/16/2021   HCT 24.5 (L) 09/16/2021   MCV 87.5 09/16/2021   PLT 488 (H) 09/16/2021   Most recent BMP    Latest Ref Rng & Units 09/16/2021    2:03 AM  BMP  Glucose 70 - 99 mg/dL 97   BUN 6 - 20 mg/dL 9   Creatinine 2.58 - 5.27 mg/dL 7.82    Sodium 423 - 536 mmol/L 136   Potassium 3.5 - 5.1 mmol/L 3.6   Chloride 98 - 111 mmol/L 108   CO2 22 - 32 mmol/L 23   Calcium 8.9 - 10.3 mg/dL 7.5     Imaging/Diagnostic Tests: No new imaging  Augusto Gamble, MD 09/17/2021, 11:49 AM  PGY-1, [place] [facility] FPTS Intern pager: [phone], text pages welcome Secure chat group [facility] Teaching Service

## 2021-09-17 NOTE — Plan of Care (Signed)
  Problem: Education: Goal: Knowledge of General Education information will improve Description: Including pain rating scale, medication(s)/side effects and non-pharmacologic comfort measures Outcome: Progressing   Problem: Health Behavior/Discharge Planning: Goal: Ability to manage health-related needs will improve Outcome: Progressing   Problem: Clinical Measurements: Goal: Will remain free from infection Outcome: Progressing   Problem: Activity: Goal: Risk for activity intolerance will decrease Outcome: Progressing   Problem: Nutrition: Goal: Adequate nutrition will be maintained Outcome: Progressing   Problem: Pain Managment: Goal: General experience of comfort will improve Outcome: Progressing   

## 2021-09-17 NOTE — TOC Progression Note (Signed)
Transition of Care [facility]) - Progression Note    Patient Details  Name: Anthony [name] MRN: [id number] Date of Birth: 1970/11/14  Transition of Care [facility]) CM/SW Contact  Lorri Frederick, LCSW Phone Number: 09/17/2021, 9:50 AM  Clinical Narrative:  [name] spoke with pt regarding DC recommendations for SNF.  Pt confirms that he is planning to DC home, does not want to pursue SNF.      Expected Discharge Plan: Skilled Nursing Facility Barriers to Discharge: SNF Pending bed offer, Continued Medical Work up  Expected Discharge Plan and Services Expected Discharge Plan: Skilled Nursing Facility   Discharge Planning Services: CM Consult Post Acute Care Choice: Home Health Living arrangements for the past 2 months: Apartment                 DME Arranged: Government social research officer DME Agency: [employer] Date DME Agency Contacted: 09/05/21 Time DME Agency Contacted: 1410 Representative spoke with at DME Agency: Arnold Long HH Arranged: PT, RN HH Agency: [facility] Date [facility] Agency Contacted: 09/05/21 Time HH Agency Contacted: 1300 Representative spoke with at [facility] Agency: Candise Bowens   Social Determinants of Health (SDOH) Interventions    Readmission Risk Interventions     No data to display

## 2021-09-17 NOTE — Progress Notes (Signed)
[facility]  Date of Admission:  09/03/2021           Reason for visit: Follow up on septic arthritis of left hip, bacteremia, endocarditis  Current antibiotics: Unasyn    ASSESSMENT:    51 y.o. male admitted with:  Left hip septic arthritis/osteomyelitis: Status post I&D 8/23 and 09/13/2021 with orthopedic surgery.  Multiple cultures including bone cultures of the left hip positive for Enterococcus faecalis and Bacteroides.  Additionally, admission blood cultures were positive for strep anginosus.  Planning for repeat OR with Dr. [name] tomorrow 09/18/2021. Mitral valve endocarditis: As noted above, admission blood cultures + 09/03/2021 for strep anginosus with repeat blood cultures 09/06/2021 no growth and [name] on 8/21 showing a mitral valve vegetation.  Difficult to discern which is the offending organism to cause endocarditis as E faecalis is more likely that Strep anginosis to typically cause IE and treatment varies depending on the organism and need for synergy.   Chronic pressure ulcers: Receiving wound care.  RECOMMENDATIONS:    Continue antibiotics Discussed with pharmacy re: antibiotic selection Will change to ampicillin and ceftriaxone for possible native valve IE due to E faecalis.  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   Endocarditis of mitral  valve   Constipation due to opioid therapy   Anemia following surgery    MEDICATIONS:    Scheduled Meds:  sodium chloride   Intravenous Once   sodium chloride   Intravenous Once   Chlorhexidine Gluconate Cloth  6 each Topical Daily   flecainide  50 mg Oral BID   rosuvastatin  40 mg Oral Daily   Continuous Infusions:  sodium chloride 10 mL/hr at 09/13/21 1133   sodium chloride 10 mL/hr at 09/14/21 1545   ampicillin-sulbactam (UNASYN) IV 3 g (09/17/21 0539)   methocarbamol (ROBAXIN) IV     PRN Meds:.acetaminophen, bisacodyl, famotidine, [COMPLETED] hydrOXYzine **FOLLOWED BY** hydrOXYzine, ibuprofen, lip balm, loratadine, methocarbamol **OR** methocarbamol (ROBAXIN) IV, metoCLOPramide **OR** metoCLOPramide (REGLAN) injection, ondansetron **OR** ondansetron (ZOFRAN) IV, mouth rinse, oxyCODONE **OR** oxyCODONE, polyethylene glycol, prochlorperazine, senna-docusate  SUBJECTIVE:   24 hour events:  There are no acute events noted overnight Afebrile, Tmax 99.4 No current labs this morning No new imaging No new cultures Narrowed antibiotics yesterday to Unasyn and tolerating thus far  No new complaints.  Wants to know how long he will be on antibiotics.   Review of Systems  All other systems reviewed and are negative.     OBJECTIVE:   Blood pressure 118/62, pulse 93, temperature 99 F (37.2 C), temperature source Oral, resp. rate 18, height 5\' 8"  (1.727 m), weight 87.7 kg, SpO2 97 %. Body mass index is 29.4 kg/m.  Physical Exam Constitutional:      Appearance: Normal appearance.  HENT:     Head: Normocephalic and atraumatic.  Eyes:  Extraocular Movements: Extraocular movements intact.     Conjunctiva/sclera: Conjunctivae normal.  Pulmonary:     Effort: Pulmonary effort is normal. No respiratory distress.  Abdominal:     General: There is no distension.     Palpations: Abdomen is soft.  Musculoskeletal:     Cervical back: Normal range of motion and neck supple.   Neurological:     General: No focal deficit present.     Mental Status: He is alert and oriented to person, place, and time.  Psychiatric:        Mood and Affect: Mood normal.        Behavior: Behavior normal.      Lab Results: Lab Results  Component Value Date   WBC 16.8 (H) 09/16/2021   HGB 7.9 (L) 09/16/2021   HCT 24.5 (L) 09/16/2021   MCV 87.5 09/16/2021   PLT 488 (H) 09/16/2021    Lab Results  Component Value Date   NA 136 09/16/2021   K 3.6 09/16/2021   CO2 23 09/16/2021   GLUCOSE 97 09/16/2021   BUN 9 09/16/2021   CREATININE 0.56 (L) 09/16/2021   CALCIUM 7.5 (L) 09/16/2021   GFRNONAA >60 09/16/2021   GFRAA >60 01/31/2017    Lab Results  Component Value Date   ALT 40 09/07/2021   AST 38 09/07/2021   ALKPHOS 310 (H) 09/07/2021   BILITOT 0.4 09/07/2021    No results found for: "CRP"  No results found for: "ESRSEDRATE"   I have reviewed the micro and lab results in Epic.  Imaging: No results found.   Imaging independently reviewed in Epic.    Vedia Coffer for Infectious Disease [facility] [phone] pager 09/17/2021, 11:19 AM   I have personally spent 50 minutes involved in face-to-face and non-face-to-face activities for this patient on the day of the visit. Professional time spent includes the following activities: Preparing to see the patient (review of tests), Obtaining and/or reviewing separately obtained history (admission/discharge record), Performing a medically appropriate examination and/or evaluation , Ordering medications/tests/procedures, referring and communicating with other health care professionals, Documenting clinical information in the EMR, Independently interpreting results (not separately reported), Communicating results to the patient/family/caregiver, Counseling and educating the patient/family/caregiver and Care coordination (not separately reported).

## 2021-09-17 NOTE — Plan of Care (Signed)

## 2021-09-18 ENCOUNTER — Other Ambulatory Visit: Payer: Self-pay

## 2021-09-18 ENCOUNTER — Inpatient Hospital Stay (HOSPITAL_COMMUNITY): Payer: Medicare Other | Admitting: Anesthesiology

## 2021-09-18 ENCOUNTER — Encounter (HOSPITAL_COMMUNITY): Payer: Self-pay | Admitting: Family Medicine

## 2021-09-18 ENCOUNTER — Encounter (HOSPITAL_COMMUNITY)
Admission: EM | Disposition: A | Discharge status: Skilled Nursing Facility | Payer: Self-pay | Source: Home / Self Care | Attending: Family Medicine

## 2021-09-18 DIAGNOSIS — R7881 Bacteremia: Secondary | ICD-10-CM | POA: Diagnosis not present

## 2021-09-18 DIAGNOSIS — F419 Anxiety disorder, unspecified: Secondary | ICD-10-CM

## 2021-09-18 DIAGNOSIS — I059 Rheumatic mitral valve disease, unspecified: Secondary | ICD-10-CM | POA: Diagnosis not present

## 2021-09-18 DIAGNOSIS — M009 Pyogenic arthritis, unspecified: Secondary | ICD-10-CM

## 2021-09-18 DIAGNOSIS — Z87891 Personal history of nicotine dependence: Secondary | ICD-10-CM | POA: Diagnosis not present

## 2021-09-18 DIAGNOSIS — I10 Essential (primary) hypertension: Secondary | ICD-10-CM

## 2021-09-18 HISTORY — PX: I & D EXTREMITY: SHX5045

## 2021-09-18 LAB — BASIC METABOLIC PANEL
Anion gap: 6 (ref 5–15)
BUN: 7 mg/dL (ref 6–20)
CO2: 26 mmol/L (ref 22–32)
Calcium: 7.9 mg/dL — ABNORMAL LOW (ref 8.9–10.3)
Chloride: 105 mmol/L (ref 98–111)
Creatinine, Ser: 0.64 mg/dL (ref 0.61–1.24)
GFR, Estimated: >60 mL/min (ref >60)
Glucose, Bld: 108 mg/dL — ABNORMAL HIGH (ref 70–99)
Potassium: 3.8 mmol/L (ref 3.5–5.1)
Sodium: 137 mmol/L (ref 135–145)

## 2021-09-18 LAB — CBC
HCT: 26.3 % — ABNORMAL LOW (ref 39.0–52.0)
HCT: 23.2 % — ABNORMAL LOW (ref 39.0–52.0)
Hemoglobin: 8 g/dL — ABNORMAL LOW (ref 13.0–17.0)
Hemoglobin: 7.5 g/dL — ABNORMAL LOW (ref 13.0–17.0)
MCH: 28.3 pg (ref 26.0–34.0)
MCH: 29.1 pg (ref 26.0–34.0)
MCHC: 30.4 g/dL (ref 30.0–36.0)
MCHC: 32.3 g/dL (ref 30.0–36.0)
MCV: 92.9 fL (ref 80.0–100.0)
MCV: 89.9 fL (ref 80.0–100.0)
Platelets: 454 10*3/uL — ABNORMAL HIGH (ref 150–400)
Platelets: 417 10*3/uL — ABNORMAL HIGH (ref 150–400)
RBC: 2.83 MIL/uL — ABNORMAL LOW (ref 4.22–5.81)
RBC: 2.58 MIL/uL — ABNORMAL LOW (ref 4.22–5.81)
RDW: 19.8 % — ABNORMAL HIGH (ref 11.5–15.5)
RDW: 19.2 % — ABNORMAL HIGH (ref 11.5–15.5)
WBC: 12.5 10*3/uL — ABNORMAL HIGH (ref 4.0–10.5)
WBC: 15.4 10*3/uL — ABNORMAL HIGH (ref 4.0–10.5)
nRBC: 0 % (ref 0.0–0.2)
nRBC: 0 % (ref 0.0–0.2)

## 2021-09-18 LAB — SURGICAL PCR SCREEN
MRSA, PCR: NEGATIVE
Staphylococcus aureus: NEGATIVE

## 2021-09-18 SURGERY — IRRIGATION AND DEBRIDEMENT EXTREMITY
Anesthesia: General | Site: Hip | Laterality: Left

## 2021-09-18 MED ORDER — MIDAZOLAM HCL 2 MG/2ML IJ SOLN
INTRAMUSCULAR | Status: AC
  Filled 2021-09-18: qty 2

## 2021-09-18 MED ORDER — METOCLOPRAMIDE HCL 5 MG/ML IJ SOLN: 5.0000 mg | Freq: Three times a day (TID) | INTRAMUSCULAR | Status: DC | PRN

## 2021-09-18 MED ORDER — PROPOFOL 10 MG/ML IV BOLUS
INTRAVENOUS | Status: AC
  Filled 2021-09-18: qty 20

## 2021-09-18 MED ORDER — PHENYLEPHRINE 80 MCG/ML (10ML) SYRINGE FOR IV PUSH (FOR BLOOD PRESSURE SUPPORT)
PREFILLED_SYRINGE | INTRAVENOUS | Status: DC | PRN
  Administered 2021-09-18 (×5): 160 ug via INTRAVENOUS

## 2021-09-18 MED ORDER — ALBUMIN HUMAN 5 % IV SOLN: INTRAVENOUS | Status: DC | PRN

## 2021-09-18 MED ORDER — CHLORHEXIDINE GLUCONATE 4 % EX LIQD
60.0000 mL | Freq: Once | CUTANEOUS | Status: AC
  Administered 2021-09-18: 4 via TOPICAL
  Filled 2021-09-18: qty 45

## 2021-09-18 MED ORDER — METHOCARBAMOL 500 MG PO TABS
500.0000 mg | ORAL_TABLET | Freq: Four times a day (QID) | ORAL | Status: DC | PRN
  Administered 2021-09-18 – 2021-09-24 (×9): 500 mg via ORAL
  Filled 2021-09-18 (×9): qty 1

## 2021-09-18 MED ORDER — PROPOFOL 10 MG/ML IV BOLUS
INTRAVENOUS | Status: DC | PRN
  Administered 2021-09-18: 150 mg via INTRAVENOUS

## 2021-09-18 MED ORDER — MEPERIDINE HCL 25 MG/ML IJ SOLN: 6.2500 mg | INTRAMUSCULAR | Status: DC | PRN

## 2021-09-18 MED ORDER — DEXAMETHASONE SODIUM PHOSPHATE 10 MG/ML IJ SOLN
INTRAMUSCULAR | Status: DC | PRN
  Administered 2021-09-18: 5 mg via INTRAVENOUS

## 2021-09-18 MED ORDER — BISACODYL 10 MG RE SUPP: 10.0000 mg | Freq: Every day | RECTAL | Status: DC | PRN

## 2021-09-18 MED ORDER — DOCUSATE SODIUM 100 MG PO CAPS
100.0000 mg | ORAL_CAPSULE | Freq: Two times a day (BID) | ORAL | Status: DC
  Administered 2021-09-18 – 2021-09-22 (×2): 100 mg via ORAL
  Filled 2021-09-18 (×13): qty 1

## 2021-09-18 MED ORDER — TRANEXAMIC ACID-NACL 1000-0.7 MG/100ML-% IV SOLN
INTRAVENOUS | Status: AC
  Filled 2021-09-18: qty 100

## 2021-09-18 MED ORDER — LIDOCAINE 2% (20 MG/ML) 5 ML SYRINGE
INTRAMUSCULAR | Status: DC | PRN
  Administered 2021-09-18: 80 mg via INTRAVENOUS

## 2021-09-18 MED ORDER — LACTATED RINGERS IV SOLN: INTRAVENOUS | Status: DC

## 2021-09-18 MED ORDER — METHOCARBAMOL 1000 MG/10ML IJ SOLN: 500.0000 mg | Freq: Four times a day (QID) | INTRAVENOUS | Status: DC | PRN

## 2021-09-18 MED ORDER — OXYCODONE HCL 5 MG PO TABS: 5.0000 mg | ORAL_TABLET | Freq: Once | ORAL | Status: DC | PRN

## 2021-09-18 MED ORDER — METOCLOPRAMIDE HCL 5 MG PO TABS: 5.0000 mg | ORAL_TABLET | Freq: Three times a day (TID) | ORAL | Status: DC | PRN

## 2021-09-18 MED ORDER — 0.9 % SODIUM CHLORIDE (POUR BTL) OPTIME
TOPICAL | Status: DC | PRN
  Administered 2021-09-18: 1000 mL

## 2021-09-18 MED ORDER — HYDROMORPHONE HCL 1 MG/ML IJ SOLN
0.2500 mg | INTRAMUSCULAR | Status: DC | PRN
  Administered 2021-09-18 (×2): 0.25 mg via INTRAVENOUS
  Administered 2021-09-18: 0.5 mg via INTRAVENOUS

## 2021-09-18 MED ORDER — MIDAZOLAM HCL 2 MG/2ML IJ SOLN
INTRAMUSCULAR | Status: DC | PRN
  Administered 2021-09-18: 2 mg via INTRAVENOUS

## 2021-09-18 MED ORDER — FENTANYL CITRATE (PF) 250 MCG/5ML IJ SOLN
INTRAMUSCULAR | Status: DC | PRN
  Administered 2021-09-18: 100 ug via INTRAVENOUS

## 2021-09-18 MED ORDER — OXYCODONE HCL 5 MG/5ML PO SOLN: 5.0000 mg | Freq: Once | ORAL | Status: DC | PRN

## 2021-09-18 MED ORDER — PROMETHAZINE HCL 25 MG/ML IJ SOLN: 6.2500 mg | INTRAMUSCULAR | Status: DC | PRN

## 2021-09-18 MED ORDER — CHLORHEXIDINE GLUCONATE 0.12 % MT SOLN
OROMUCOSAL | Status: AC
  Administered 2021-09-18: 15 mL
  Filled 2021-09-18: qty 15

## 2021-09-18 MED ORDER — ROCURONIUM BROMIDE 10 MG/ML (PF) SYRINGE
PREFILLED_SYRINGE | INTRAVENOUS | Status: DC | PRN
  Administered 2021-09-18: 50 mg via INTRAVENOUS

## 2021-09-18 MED ORDER — SODIUM CHLORIDE 0.9 % IR SOLN
Status: DC | PRN
  Administered 2021-09-18: 3000 mL

## 2021-09-18 MED ORDER — HYDROMORPHONE HCL 1 MG/ML IJ SOLN
INTRAMUSCULAR | Status: AC
  Filled 2021-09-18: qty 1

## 2021-09-18 MED ORDER — SUGAMMADEX SODIUM 200 MG/2ML IV SOLN
INTRAVENOUS | Status: DC | PRN
  Administered 2021-09-18: 160 mg via INTRAVENOUS

## 2021-09-18 MED ORDER — CEFAZOLIN SODIUM-DEXTROSE 2-4 GM/100ML-% IV SOLN
2.0000 g | INTRAVENOUS | Status: AC
  Administered 2021-09-18: 2 g via INTRAVENOUS
  Filled 2021-09-18: qty 100

## 2021-09-18 MED ORDER — SODIUM CHLORIDE 0.9 % IV SOLN: INTRAVENOUS | Status: DC

## 2021-09-18 MED ORDER — POLYETHYLENE GLYCOL 3350 17 G PO PACK: 17.0000 g | PACK | Freq: Every day | ORAL | Status: DC | PRN

## 2021-09-18 MED ORDER — FENTANYL CITRATE (PF) 250 MCG/5ML IJ SOLN
INTRAMUSCULAR | Status: AC
  Filled 2021-09-18: qty 5

## 2021-09-18 MED ORDER — MAGNESIUM CITRATE PO SOLN
1.0000 | Freq: Once | ORAL | Status: DC | PRN
  Filled 2021-09-18: qty 296

## 2021-09-18 MED ORDER — ONDANSETRON HCL 4 MG/2ML IJ SOLN
INTRAMUSCULAR | Status: DC | PRN
  Administered 2021-09-18: 4 mg via INTRAVENOUS

## 2021-09-18 MED ORDER — POVIDONE-IODINE 10 % EX SWAB
2.0000 | Freq: Once | CUTANEOUS | Status: AC
  Administered 2021-09-18: 2 via TOPICAL

## 2021-09-18 MED ORDER — TRANEXAMIC ACID-NACL 1000-0.7 MG/100ML-% IV SOLN
INTRAVENOUS | Status: DC | PRN
  Administered 2021-09-18: 1000 mg via INTRAVENOUS

## 2021-09-18 SURGICAL SUPPLY — 34 items
BAG COUNTER SPONGE SURGICOUNT (BAG) IMPLANT
BAG SPNG CNTER NS LX DISP (BAG) ×1
BLADE SURG 21 STRL SS (BLADE) ×1 IMPLANT
BNDG COHESIVE 6X5 TAN STRL LF (GAUZE/BANDAGES/DRESSINGS) IMPLANT
CANISTER WOUNDNEG PRESSURE 500 (CANNISTER) IMPLANT
COVER SURGICAL LIGHT HANDLE (MISCELLANEOUS) ×2 IMPLANT
DRAPE DERMATAC (DRAPES) IMPLANT
DRAPE U-SHAPE 47X51 STRL (DRAPES) ×1 IMPLANT
DRESSING PREVENA PLUS CUSTOM (GAUZE/BANDAGES/DRESSINGS) IMPLANT
DRSG ADAPTIC 3X8 NADH LF (GAUZE/BANDAGES/DRESSINGS) ×1 IMPLANT
DRSG PREVENA PLUS CUSTOM (GAUZE/BANDAGES/DRESSINGS) ×1
DURAPREP 26ML APPLICATOR (WOUND CARE) ×1 IMPLANT
ELECT REM PT RETURN 9FT ADLT (ELECTROSURGICAL) ×1
ELECTRODE REM PT RTRN 9FT ADLT (ELECTROSURGICAL) IMPLANT
GAUZE SPONGE 4X4 12PLY STRL (GAUZE/BANDAGES/DRESSINGS) ×1 IMPLANT
GLOVE BIOGEL PI IND STRL 9 (GLOVE) ×1 IMPLANT
GLOVE BIOGEL PI INDICATOR 9 (GLOVE) ×1
GLOVE SURG ORTHO 9.0 STRL STRW (GLOVE) ×1 IMPLANT
GOWN STRL REUS W/ TWL XL LVL3 (GOWN DISPOSABLE) ×2 IMPLANT
GOWN STRL REUS W/TWL XL LVL3 (GOWN DISPOSABLE) ×2
GRAFT SKIN WND SURGICLOSE M95 (Tissue) IMPLANT
HANDPIECE INTERPULSE COAX TIP (DISPOSABLE) ×1
KIT BASIN OR (CUSTOM PROCEDURE TRAY) ×1 IMPLANT
KIT TURNOVER KIT B (KITS) ×1 IMPLANT
MANIFOLD NEPTUNE II (INSTRUMENTS) ×1 IMPLANT
NS IRRIG 1000ML POUR BTL (IV SOLUTION) ×1 IMPLANT
PACK ORTHO EXTREMITY (CUSTOM PROCEDURE TRAY) ×1 IMPLANT
PAD ARMBOARD 7.5X6 YLW CONV (MISCELLANEOUS) ×2 IMPLANT
SET HNDPC FAN SPRY TIP SCT (DISPOSABLE) IMPLANT
STOCKINETTE IMPERVIOUS 9X36 MD (GAUZE/BANDAGES/DRESSINGS) IMPLANT
SUT ETHILON 2 0 PSLX (SUTURE) ×1 IMPLANT
TOWEL GREEN STERILE (TOWEL DISPOSABLE) ×1 IMPLANT
TUBE CONNECTING 12X1/4 (SUCTIONS) ×1 IMPLANT
YANKAUER SUCT BULB TIP NO VENT (SUCTIONS) ×1 IMPLANT

## 2021-09-18 NOTE — TOC Progression Note (Signed)
Transition of Care [facility]) - Progression Note    Patient Details  Name: Anthony [name] MRN: [id number] Date of Birth: 1970/06/17  Transition of Care [facility]) CM/SW Contact  Lorri Frederick, LCSW Phone Number: 09/18/2021, 2:32 PM  Clinical Narrative:   Pt now requiring long term IV abx, agreeable to SNF for this purpose.  FL2 updated.  Pt has two existing offers in hub: [facility], [facility].  [name] also [facility] to review.   1530: [facility] cannot offer bed.    Expected Discharge Plan: Skilled Nursing Facility Barriers to Discharge: SNF Pending bed offer, Continued Medical Work up  Expected Discharge Plan and Services Expected Discharge Plan: Skilled Nursing Facility   Discharge Planning Services: CM Consult Post Acute Care Choice: Home Health Living arrangements for the past 2 months: Apartment                 DME Arranged: Government social research officer DME Agency: [employer] Date DME Agency Contacted: 09/05/21 Time DME Agency Contacted: 1410 Representative spoke with at DME Agency: Arnold Long HH Arranged: PT, RN HH Agency: [facility] Date [facility] Agency Contacted: 09/05/21 Time HH Agency Contacted: 1300 Representative spoke with at [facility] Agency: Candise Bowens   Social Determinants of Health (SDOH) Interventions    Readmission Risk Interventions     No data to display

## 2021-09-18 NOTE — Anesthesia Procedure Notes (Signed)
Procedure Name: Intubation Date/Time: 09/18/2021 8:36 AM  Performed by: Elliot Dally, CRNAPre-anesthesia Checklist: Patient identified, Emergency Drugs available, Suction available and Patient being monitored Patient Re-evaluated:Patient Re-evaluated prior to induction Oxygen Delivery Method: Circle System Utilized Preoxygenation: Pre-oxygenation with 100% oxygen Induction Type: IV induction Ventilation: Mask ventilation without difficulty Laryngoscope Size: Miller and 2 Grade View: Grade I Tube type: Oral Tube size: 7.5 mm Number of attempts: 1 Airway Equipment and Method: Stylet and Oral airway Placement Confirmation: ETT inserted through vocal cords under direct vision, positive ETCO2 and breath sounds checked- equal and bilateral Secured at: 22 cm Tube secured with: Tape Dental Injury: Teeth and Oropharynx as per pre-operative assessment

## 2021-09-18 NOTE — Interval H&P Note (Signed)
History and Physical Interval Note:  09/18/2021 6:39 AM  Anthony [name]  has presented today for surgery, with the diagnosis of Septic Left Hip.  The various methods of treatment have been discussed with the patient and family. After consideration of risks, benefits and other options for treatment, the patient has consented to  Procedure(s): DEBRIDEMENT LEFT HIP (Left) as a surgical intervention.  The patient's history has been reviewed, patient examined, no change in status, stable for surgery.  I have reviewed the patient's chart and labs.  Questions were answered to the patient's satisfaction.     [name]

## 2021-09-18 NOTE — Progress Notes (Addendum)
Daily Progress Note Intern Pager: [phone]  Patient name: Anthony [name] Medical record number: [id number] Date of birth: 04-30-1970 Age: 51 y.o. Gender: male  Primary Care Provider: Erick Alley, DO Consultants: ortho, ID Code Status: FULL  Pt Overview and Major Events to Date:  8/15: Admitted for sepsis 8/16: Septic shock, transfered to ICU. Started on pressors 8/18: Off pressors, transferred back to FMTS 8/22: Left hip debridement and removal of left hip femoral head 8/25: Planned left hip debridement 8/26: 2 U pRBCs transfused 8/30: repeat debridement  Assessment and Plan: Anthony [name] is a 51 y.o. male with PMHx of paraplegia from spina bifida who was admitted for septic L hip arthritis, complicated by septic shock warranting ICU visit, now back on floor and s/p surgical debridement awaiting repeat debridement this AM  * Septic arthritis of hip (HCC) S/P removal of left femoral head 8/22.  Repeat wound debridement today (8/30). No repeat of debridement mentioned on ortho post-op note, to follow up with ortho in 1 week. Wound cultures grew Enterococcus facialis. [name] showed vegetation on the mitral valve and pt will need 6 weeks IV antibiotics. Patient was afebrile overnight. - ID following, appreciate recommendations  - Ortho surgery consulted, appreciate recommendations - Scheduled Tylenol, 5-10 mg Oxy as needed for pain control - continue ampicillin and ceftriaxone for possible native valve IE due to E faecalis per ID - continue abx per above  Pressure ulcer Chronic stage IV pressure ulcer on left buttock, stage III and stage II ulcers present in sacral region.  Additional wound on dorsum of right foot noted. Wound care consulted, topical wound care orders in place. No further needs from WOC at this time, bedside nursing to perform wound care. - Wound care per bedside nursing  Constipation S/P Ortho surgery.  Opioids for pain control PRN, though not taking. - Start  MiraLAX and senna as needed  Anemia following surgery Hemoglobin now stable. Hgb check s/p repeat debridement (8/30) pending - held lovenox - f/u Hgb - Transfuse if less than 7  Anxiety Patient anxious about undergoing surgery. -  hydroxyzine 25 mg TID PRN  Prolonged Q-T interval on ECG On flecainide for PVC, per cardiology. Curbsided cardiology who advised to monitor QTc on telemetry, difficult to read on his EKG. QTc 460s. - Monitor QTc on tele -- Optimize electrolytes K>4, Mg >2 - Electrolytes have been stable.  FEN/GI: NPO PPx: SCDs Dispo:Pending PT recommendations  pending clinical improvement . Barriers include ongoing treatment.   Subjective:  Patient was not in room - taken to OR per patient tracking system  Objective: Temp:  [97.6 F (36.4 C)-99.2 F (37.3 C)] 97.6 F (36.4 C) (08/30 1044) Pulse Rate:  [78-94] 80 (08/30 1044) Resp:  [14-25] 18 (08/30 1030) BP: (96-117)/(52-74) 105/63 (08/30 1044) SpO2:  [94 %-99 %] 99 % (08/30 1044) Physical Exam: Not performed - patient was taken to OR  Laboratory: Most recent CBC Lab Results  Component Value Date   WBC 12.5 (H) 09/18/2021   HGB 8.0 (L) 09/18/2021   HCT 26.3 (L) 09/18/2021   MCV 92.9 09/18/2021   PLT 454 (H) 09/18/2021   Most recent BMP    Latest Ref Rng & Units 09/18/2021    1:43 AM  BMP  Glucose 70 - 99 mg/dL 643   BUN 6 - 20 mg/dL 7   Creatinine 3.29 - 5.18 mg/dL 8.41   Sodium 660 - 630 mmol/L 137   Potassium 3.5 - 5.1 mmol/L 3.8  Chloride 98 - 111 mmol/L 105   CO2 22 - 32 mmol/L 26   Calcium 8.9 - 10.3 mg/dL 7.9    Imaging/Diagnostic Tests: No new imaging  Anthony Gamble, MD 09/18/2021, 11:35 AM  PGY-1, [facility] FPTS Intern pager: [phone], text pages welcome Secure chat group [facility] Teaching Service

## 2021-09-18 NOTE — Op Note (Signed)
09/18/2021  9:41 AM  PATIENT:  Anthony [name]    PRE-OPERATIVE DIAGNOSIS:  Septic Left Hip  POST-OPERATIVE DIAGNOSIS:  Same  PROCEDURE:  DEBRIDEMENT LEFT HIP Partial excision of the pelvis and acetabulum, partial excision proximal femur, extensive excision to skin and soft tissue muscle and fascia. Application of Kerecis micro powder 95 cm. Application of incisional wound VAC customizable.  SURGEON:  [name], MD  PHYSICIAN ASSISTANT:None ANESTHESIA:   General  PREOPERATIVE INDICATIONS:  Anthony [name] is a  51 y.o. male with a diagnosis of Septic Left Hip who failed conservative measures and elected for surgical management.    The risks benefits and alternatives were discussed with the patient preoperatively including but not limited to the risks of infection, bleeding, nerve injury, cardiopulmonary complications, the need for revision surgery, among others, and the patient was willing to proceed.  OPERATIVE IMPLANTS: Kerecis micro powder tissue graft 95 cm.  @ENCIMAGES @  OPERATIVE FINDINGS: Tissue margins were clear good petechial bleeding.  OPERATIVE PROCEDURE: Patient was brought the operating room and underwent a general anesthetic.  After adequate levels anesthesia were obtained patient was placed in the left lateral decubitus position with the right side up the right lower extremity was prepped using DuraPrep draped into a sterile field with a shower curtain a timeout was called.  The cleanse choice wound VAC sponges were removed.  Patient had margins that were not viable.  A 21 blade knife rondure and Cobb elevator used to excise skin and soft tissue muscle fascia and bone.  Bone was removed from the pelvis and acetabulum bone was removed from the proximal femur.  After resection back to healthy viable margins electrocautery was used for hemostasis patient received IV TXA.  The wound was irrigated with 3 L pulsatile lavage.  Electrocautery was used for further hemostasis.   Local tissue rearrangement was used to close the wound that was 30 x 20 cm.  The Kerecis micro tissue graft was placed within the wound.  Incision closed using 2-0 nylon.  The customizable wound VAC was applied this was covered with derma tack and clear drape this had a good suction fit patient was extubated taken the PACU in stable condition.  Debridement type: Excisional Debridement  Side: right  Body Location: hip   Tools used for debridement: scalpel, scissors, curette, and rongeur  Pre-debridement Wound size (cm):   Length: 30        Width: 20     Depth: 10   Post-debridement Wound size (cm):   Length: 30        Width: 20     Depth: 10   Debridement depth beyond dead/damaged tissue down to healthy viable tissue: yes  Tissue layer involved: skin, subcutaneous tissue, muscle / fascia, bone  Nature of tissue removed: Slough, , and Non-viable tissue  Irrigation volume: 3 liters     Irrigation fluid type: Normal Saline    DISCHARGE PLANNING:  Antibiotic duration: Continue IV antibiotics as per infectious disease  Weightbearing: No restrictions with transfers  Pain medication: Opioid pathway  Dressing care/ Wound VAC: Discharge with the [name] wound VAC pump  Ambulatory devices: Transfer training  Discharge to: Discharge to home.  Follow-up: In the office 1 week post operative.

## 2021-09-18 NOTE — [place] FL2 (Signed)
[place] MEDICAID FL2 LEVEL OF CARE SCREENING TOOL     IDENTIFICATION  Patient Name: Anthony [name] Birthdate: 1970/09/01 Sex: male Admission Date (Current Location): 09/03/2021  [facility] and [place] Number:  Producer, television/film/video and Address:  [facility], 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    Endocarditis of mitral valve    Hypokalemia    Sepsis (HCC)    Osteomyelitis (HCC)    Pyogenic arthritis of left hip (HCC)    Septic arthritis of hip (HCC) 09/03/2021   Prolonged Q-T interval on ECG 09/03/2021   Pressure injury of skin of left buttock 05/13/2021   PVCs (premature ventricular contractions)    Atypical chest pain 01/04/2020   Slow transit constipation 03/06/2017   Neurogenic bladder 03/06/2017   Suprapubic catheter (HCC) 03/06/2017   Paraplegia (HCC) 01/29/2017   Pressure injury of skin 01/27/2017   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/24/2017 01/24/2017   Status post debridement 01/24/2017   Hyperlipidemia 03/11/2014   Spina bifida of lumbar region (HCC) 09/30/2012   Routine adult health maintenance 03/26/2012   ALLERGIC RHINITIS 06/01/2007   Anxiety 03/19/2006   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   VENOUS INSUFFICIENCY, CHRONIC 03/19/2006   Pressure ulcer 03/19/2006    Orientation RESPIRATION BLADDER Height & Weight     Self, Time, Situation, Place  Normal  Incontinent, Indwelling catheter Weight: 193 lb 5.5 oz (87.7 kg) Height:  5\' 8"  (172.7 cm)  BEHAVIORAL SYMPTOMS/MOOD NEUROLOGICAL BOWEL NUTRITION STATUS      Incontinent Diet (see d/c summary)  AMBULATORY STATUS COMMUNICATION OF NEEDS Skin   Total Care (paraplegia) Verbally PU Stage and Appropriate Care (Pressure Injury stage 3, buttocks; pressure injury stage 4 ischeal tuberosity)                       Personal Care Assistance Level of Assistance  Bathing, Feeding, Dressing Bathing Assistance: Limited assistance Feeding assistance: Independent Dressing Assistance: Limited assistance     Functional Limitations Info  Sight, Hearing, Speech Sight Info: Impaired Hearing Info: Adequate Speech Info: Adequate    SPECIAL CARE FACTORS FREQUENCY   (Will require IV antibiotics: ampicillin and ceftriaxone for 6 weeks)     PT Frequency: 5x/week OT Frequency: 5x/week            Contractures Contractures Info: Not present    Additional Factors Info  Code Status, Allergies Code Status Info: Full code Allergies Info: Vancomycin           Current Medications (09/18/2021):  This is the current hospital active medication list Current Facility-Administered Medications  Medication Dose Route Frequency Provider Last Rate Last Admin   0.9 %  sodium chloride infusion (Manually program via Guardrails IV Fluids)   Intravenous Once 09/20/2021, MD       0.9 %  sodium chloride infusion (Manually program via Guardrails IV Fluids)  Intravenous Once [name], MD       0.9 %  sodium chloride infusion  250 mL Intravenous Continuous [name], MD 10 mL/hr at 09/13/21 1133 New Bag at 09/13/21 1244   0.9 %  sodium chloride infusion   Intravenous Continuous [name], MD 10 mL/hr at 09/14/21 1545 New Bag at 09/14/21 1545   0.9 %  sodium chloride infusion   Intravenous Continuous [name], MD 10 mL/hr at 09/18/21 1156 New Bag at 09/18/21 1156   acetaminophen (TYLENOL)  tablet 1,000 mg  1,000 mg Oral BID PRN [name], MD       ampicillin (OMNIPEN) 2 g in sodium chloride 0.9 % 100 mL IVPB  2 g Intravenous Q4H [name], MD 300 mL/hr at 09/18/21 1203 2 g at 09/18/21 1203   bisacodyl (DULCOLAX) suppository 10 mg  10 mg Rectal Daily PRN [name], MD       cefTRIAXone (ROCEPHIN) 2 g in sodium chloride 0.9 % 100 mL IVPB  2 g Intravenous Q12H [name], MD 200 mL/hr at 09/17/21 2201 2 g at 09/17/21 2201   Chlorhexidine Gluconate Cloth 2 % PADS 6 each  6 each Topical Daily [name], MD   6 each at 09/17/21 0844   docusate sodium (COLACE) capsule 100 mg  100 mg Oral BID [name], MD       famotidine (PEPCID) tablet 10 mg  10 mg Oral Daily PRN [name], MD   10 mg at 09/04/21 1513   flecainide (TAMBOCOR) tablet 50 mg  50 mg Oral BID [name], MD   50 mg at 09/17/21 2202   HYDROmorphone (DILAUDID) 1 MG/ML injection            hydrOXYzine (ATARAX) tablet 25 mg  25 mg Oral TID PRN [name], MD   25 mg at 09/13/21 0834   ibuprofen (ADVIL) tablet 600 mg  600 mg Oral Q6H PRN [name], MD   600 mg at 09/09/21 1915   lip balm (CARMEX) ointment   Topical PRN [name], MD       loratadine (CLARITIN) tablet 10 mg  10 mg Oral Daily PRN [name], MD   10 mg at 09/07/21 0658   magnesium citrate solution 1 Bottle  1 Bottle Oral Once PRN [name], MD       methocarbamol (ROBAXIN) tablet 500 mg  500 mg Oral Q6H PRN [name], MD       Or   methocarbamol (ROBAXIN) 500 mg in dextrose 5 % 50 mL IVPB  500 mg Intravenous Q6H PRN [name], MD       metoCLOPramide (REGLAN) tablet 5-10 mg  5-10 mg Oral Q8H PRN [name], MD       Or   metoCLOPramide (REGLAN) injection 5-10 mg  5-10 mg Intravenous Q8H PRN [name], MD       metroNIDAZOLE (FLAGYL) tablet 500 mg  500 mg Oral Q12H [name], MD   500 mg at 09/17/21 2155   ondansetron (ZOFRAN) tablet 4 mg  4 mg Oral Q6H PRN [name], MD        Or   ondansetron [facility]) injection 4 mg  4 mg Intravenous Q6H PRN [name], MD       Oral care mouth rinse  15 mL Mouth Rinse PRN [name], MD  oxyCODONE (Oxy IR/ROXICODONE) immediate release tablet 5 mg  5 mg Oral Q4H PRN [name], MD   5 mg at 09/18/21 1157   Or   oxyCODONE (Oxy IR/ROXICODONE) immediate release tablet 10 mg  10 mg Oral Q4H PRN [name], MD       polyethylene glycol (MIRALAX / GLYCOLAX) packet 17 g  17 g Oral Daily PRN [name], MD       prochlorperazine (COMPAZINE) tablet 5 mg  5 mg Oral Q6H PRN [name], MD   5 mg at 09/11/21 9811   rosuvastatin (CRESTOR) tablet 40 mg  40 mg Oral Daily [name], MD   40 mg at 09/17/21 2202   senna-docusate (Senokot-S) tablet 1 tablet  1 tablet Oral QHS PRN [name], MD         Discharge Medications: Please see discharge summary for a list of discharge medications.  Relevant Imaging Results:  Relevant Lab Results:   Additional Information ssn: [id number]  Lorri Frederick, LCSW

## 2021-09-18 NOTE — Anesthesia Preprocedure Evaluation (Addendum)
Anesthesia Evaluation  Patient identified by MRN, date of birth, ID band Patient awake    Reviewed: Allergy & Precautions, NPO status , Patient's Chart, lab work & pertinent test results  History of Anesthesia Complications Negative for: history of anesthetic complications  Airway Mallampati: II  TM Distance: >3 FB Neck ROM: Full    Dental  (+) Poor Dentition, Missing, Dental Advisory Given, Chipped   Pulmonary former smoker,    Pulmonary exam normal        Cardiovascular hypertension, Pt. on medications Normal cardiovascular exam Rhythm:Regular  Echo 09/09/2021 1. Left ventricular ejection fraction, by estimation, is 60 to 65%. The left ventricle has normal function. The left ventricle has no regional wall motion abnormalities.  2. Right ventricular systolic function is normal. The right ventricular size is normal.  3. Abnormal mitral valve with a small mobile density on the ventricular side of the anterior mitral valve leaflet. In the setting of bacteremia, cannot rule out vegetation. Mild mitral valve regurgitation. No evidence of mitral stenosis.  4. The aortic valve is normal in structure. Aortic valve regurgitation is trivial. No aortic stenosis is present.  5. The inferior vena cava is normal in size with greater than 50% respiratory variability, suggesting right atrial pressure of 3 mmHg.  6. No left atrial/left atrial appendage thrombus was detected.    Neuro/Psych Anxiety Spina bifida, wheelchair bound    GI/Hepatic negative GI ROS, Neg liver ROS,   Endo/Other  negative endocrine ROS  Renal/GU negative Renal ROS   Suprapubic catheter    Musculoskeletal  (+) Arthritis , Septic Left Hip   Abdominal   Peds  Hematology  (+) Blood dyscrasia (Hgb 7.0), anemia ,   Anesthesia Other Findings Day of surgery medications reviewed with patient.  Reproductive/Obstetrics negative OB ROS                             Anesthesia Physical  Anesthesia Plan  ASA: 3  Anesthesia Plan: General   Post-op Pain Management: Tylenol PO (pre-op)*   Induction: Intravenous  PONV Risk Score and Plan: 2 and Treatment may vary due to age or medical condition, Midazolam, Ondansetron and Dexamethasone  Airway Management Planned: Oral ETT  Additional Equipment: None  Intra-op Plan:   Post-operative Plan: Extubation in OR  Informed Consent: I have reviewed the patients History and Physical, chart, labs and discussed the procedure including the risks, benefits and alternatives for the proposed anesthesia with the patient or authorized representative who has indicated his/her understanding and acceptance.     Dental advisory given  Plan Discussed with: CRNA  Anesthesia Plan Comments:        Anesthesia Quick Evaluation

## 2021-09-18 NOTE — Transfer of Care (Signed)
Immediate Anesthesia Transfer of Care Note  Patient: Anthony [name]  Procedure(s) Performed: DEBRIDEMENT LEFT HIP, WOUND VAC EXCHANGE (Left: Hip)  Patient Location: PACU  Anesthesia Type:General  Level of Consciousness: drowsy  Airway & Oxygen Therapy: Patient Spontanous Breathing and Patient connected to nasal cannula oxygen  Post-op Assessment: Report given to RN and Post -op Vital signs reviewed and stable  Post vital signs: Reviewed and stable  Last Vitals:  Vitals Value Taken Time  BP 96/52 09/18/21 0942  Temp    Pulse 83 09/18/21 0943  Resp 18 09/18/21 0943  SpO2 96 % 09/18/21 0943  Vitals shown include unvalidated device data.  Last Pain:  Vitals:   09/18/21 0746  TempSrc: Oral  PainSc:       Patients Stated Pain Goal: 2 (09/13/21 2100)  Complications: No notable events documented.

## 2021-09-19 ENCOUNTER — Inpatient Hospital Stay: Payer: Self-pay

## 2021-09-19 ENCOUNTER — Encounter (HOSPITAL_COMMUNITY): Payer: Self-pay | Admitting: Orthopedic Surgery

## 2021-09-19 DIAGNOSIS — M009 Pyogenic arthritis, unspecified: Secondary | ICD-10-CM | POA: Diagnosis not present

## 2021-09-19 DIAGNOSIS — I059 Rheumatic mitral valve disease, unspecified: Secondary | ICD-10-CM | POA: Diagnosis not present

## 2021-09-19 DIAGNOSIS — R7881 Bacteremia: Secondary | ICD-10-CM | POA: Diagnosis not present

## 2021-09-19 DIAGNOSIS — M86072 Acute hematogenous osteomyelitis, left ankle and foot: Secondary | ICD-10-CM | POA: Diagnosis not present

## 2021-09-19 DIAGNOSIS — L89522 Pressure ulcer of left ankle, stage 2: Secondary | ICD-10-CM | POA: Diagnosis not present

## 2021-09-19 LAB — CBC
HCT: 22.9 % — ABNORMAL LOW (ref 39.0–52.0)
HCT: 23 % — ABNORMAL LOW (ref 39.0–52.0)
Hemoglobin: 7.2 g/dL — ABNORMAL LOW (ref 13.0–17.0)
Hemoglobin: 7.1 g/dL — ABNORMAL LOW (ref 13.0–17.0)
MCH: 28.7 pg (ref 26.0–34.0)
MCH: 28.5 pg (ref 26.0–34.0)
MCHC: 31.4 g/dL (ref 30.0–36.0)
MCHC: 30.9 g/dL (ref 30.0–36.0)
MCV: 91.2 fL (ref 80.0–100.0)
MCV: 92.4 fL (ref 80.0–100.0)
Platelets: 460 10*3/uL — ABNORMAL HIGH (ref 150–400)
Platelets: 436 10*3/uL — ABNORMAL HIGH (ref 150–400)
RBC: 2.51 MIL/uL — ABNORMAL LOW (ref 4.22–5.81)
RBC: 2.49 MIL/uL — ABNORMAL LOW (ref 4.22–5.81)
RDW: 18.9 % — ABNORMAL HIGH (ref 11.5–15.5)
RDW: 19.3 % — ABNORMAL HIGH (ref 11.5–15.5)
WBC: 10 10*3/uL (ref 4.0–10.5)
WBC: 7.6 10*3/uL (ref 4.0–10.5)
nRBC: 0 % (ref 0.0–0.2)
nRBC: 0 % (ref 0.0–0.2)

## 2021-09-19 LAB — FERRITIN: Ferritin: 277 ng/mL (ref 24–336)

## 2021-09-19 LAB — IRON AND TIBC
Iron: 37 ug/dL — ABNORMAL LOW (ref 45–182)
Saturation Ratios: 17 % — ABNORMAL LOW (ref 17.9–39.5)
TIBC: 218 ug/dL — ABNORMAL LOW (ref 250–450)
UIBC: 181 ug/dL

## 2021-09-19 MED ORDER — SODIUM CHLORIDE 0.9% FLUSH
10.0000 mL | Freq: Two times a day (BID) | INTRAVENOUS | Status: DC
  Administered 2021-09-19 – 2021-09-23 (×8): 10 mL

## 2021-09-19 MED ORDER — SODIUM CHLORIDE 0.9% FLUSH
10.0000 mL | INTRAVENOUS | Status: DC | PRN
  Administered 2021-09-21 – 2021-09-24 (×3): 10 mL

## 2021-09-19 NOTE — Progress Notes (Signed)
Patient ID: Anthony [name], male   DOB: [birth year], 51 y.o.   MRN: [id number] Patient is postoperative day 1 repeat debridement left hip and wound closure with Kerecis tissue graft.  There is no drainage in the wound VAC canister there is a good suction fit.  Patient can discharge with the [name] plus portable wound VAC pump at discharge.  Anticipate patient will need long-term IV antibiotics.

## 2021-09-19 NOTE — Progress Notes (Signed)
[facility]  Date of Admission:  09/03/2021           Reason for visit: Follow up on septic left hip/OM, endocarditis, bacteremia  Current antibiotics: Ceftriaxone Ampicillin Metronidazole   ASSESSMENT:    51 y.o. male admitted with:  Left hip septic arthritis/osteomyelitis: Status post I&D 8/23, 8/25, and 09/18/2021 with orthopedic surgery.  Multiple prior cultures including bone cultures of the left hip grew Enterococcus faecalis and Bacteroides.  Additionally, his admission blood cultures grew strep anginosus. Mitral valve endocarditis: As noted above, his admission blood cultures grew strep anginosus on 09/03/2021 with repeat blood cultures 09/06/2021 no growth.  He underwent [name] on 8/21 which revealed a mitral valve vegetation.  Ultimately, difficult to discern which is the offending organism to cause endocarditis as Enterococcus faecalis is more likely than strep anginosus to be a typical pathogen to cause endocarditis.  Given this uncertainty, he has been started on antibiotics to treat for endocarditis with synergy. Chronic pressure ulcers: Receiving wound care. Anemia following surgery: Has required previous blood transfusions this admission.  RECOMMENDATIONS:    Continue ampicillin and ceftriaxone for possible native valve endocarditis due to E faecalis.  This will also similarly treat native valve endocarditis if secondary to strep anginosus Continue metronidazole for anaerobic coverage Wound care Lab monitoring No further OR plans per Dr. Jess Barters most recent note Will require long-term IV antibiotics and will order double-lumen PICC line placement for administration of ceftriaxone and ampicillin We will sign off for now, please call as needed.  See OPAT note below.  Diagnosis: Osteomyelitis, endocarditis  Culture Result: Strep anginosis, E faecalis, Bacteroides  Allergies  Allergen Reactions   Vancomycin Itching    OPAT  Orders Discharge antibiotics to be given via PICC line Discharge antibiotics: Per pharmacy protocol  Ampicillin 12g/day as a continuous infusion and Rocephin 2g IV every 12 hours and Metronidazole 500 mg po every 12 hours  Duration: 6 weeks End Date: 10/30/21  [facility] Care Per Protocol:  Home health RN for IV administration and teaching; PICC line care and labs.    Labs weekly while on IV antibiotics: _xxx_ CBC with differential __ BMP _xxx_ CMP _xxx_ CRP _xxx_ ESR __ Vancomycin trough __ CK  _xxx_ Please pull PIC at completion of IV antibiotics __ Please leave PIC in place until doctor has seen patient or been notified  Fax weekly labs to [phone]  Clinic Follow Up Appt: 10/10/21 at 3:45pm with Dr Juleen China    Principal Problem:   Septic arthritis of hip [facility]) Active Problems:   Anxiety   Pressure ulcer   Spina bifida of lumbar region ([place])   Prolonged Q-T interval on ECG   Osteomyelitis ([place])   Pyogenic arthritis of left hip (HCC)   Hypokalemia   Sepsis ([place])   Bacteremia   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   Endocarditis of mitral valve   Constipation   Anemia following surgery    MEDICATIONS:    Scheduled Meds:  Chlorhexidine Gluconate Cloth  6 each Topical Daily   docusate sodium  100 mg Oral BID   flecainide  50 mg Oral BID   metroNIDAZOLE  500 mg Oral Q12H   rosuvastatin  40 mg Oral Daily   Continuous Infusions:  sodium chloride 10 mL/hr at 09/19/21 0452   ampicillin (OMNIPEN) IV Stopped (09/19/21 0403)   cefTRIAXone (ROCEPHIN)  IV Stopped (09/18/21 2204)   methocarbamol (ROBAXIN) IV     PRN  Meds:.acetaminophen, bisacodyl, famotidine, [COMPLETED] hydrOXYzine **FOLLOWED BY** hydrOXYzine, ibuprofen, lip balm, loratadine, magnesium citrate, methocarbamol **OR** methocarbamol (ROBAXIN) IV, metoCLOPramide **OR** metoCLOPramide (REGLAN) injection, ondansetron **OR** ondansetron (ZOFRAN) IV, mouth rinse, oxyCODONE **OR**  oxyCODONE, polyethylene glycol, prochlorperazine, senna-docusate  SUBJECTIVE:   24 hour events:  Patient went to the OR yesterday with Dr. Sharol Given for his left septic hip and had debridement of the left hip with partial excision of the pelvis and acetabulum, partial excision proximal femur, and extensive excision to skin and soft tissue muscle and fascia with operative findings indicating tissue margins were clear with good petechial bleeding. Nursing notes indicate some bleeding overnight and found to have a small opening between scrotum and buttocks with serosanguineous fluid There is no new cultures from yesterday's OR Afebrile Tmax 98.8 WBC 10.0, hemoglobin 7.2, platelets 460 Continues on ceftriaxone, ampicillin, and metronidazole.  No new complaints.  He is deciding between rehab facilities.  He tolerated his surgery well yesterday.  Review of Systems  All other systems reviewed and are negative.     OBJECTIVE:   Blood pressure (!) 117/55, pulse 80, temperature 98.8 F (37.1 C), temperature source Oral, resp. rate 18, height $RemoveBe'5\' 8"'UjanzmHjY$  (1.727 m), weight 87.7 kg, SpO2 96 %. Body mass index is 29.4 kg/m.  Physical Exam Constitutional:      Appearance: Normal appearance.  Pulmonary:     Effort: Pulmonary effort is normal. No respiratory distress.  Skin:    General: Skin is warm and dry.     Findings: No rash.  Neurological:     General: No focal deficit present.     Mental Status: He is alert and oriented to person, place, and time.  Psychiatric:        Mood and Affect: Mood normal.        Behavior: Behavior normal.      Lab Results: Lab Results  Component Value Date   WBC 10.0 09/19/2021   HGB 7.2 (L) 09/19/2021   HCT 22.9 (L) 09/19/2021   MCV 91.2 09/19/2021   PLT 460 (H) 09/19/2021    Lab Results  Component Value Date   NA 137 09/18/2021   K 3.8 09/18/2021   CO2 26 09/18/2021   GLUCOSE 108 (H) 09/18/2021   BUN 7 09/18/2021   CREATININE 0.64 09/18/2021    CALCIUM 7.9 (L) 09/18/2021   GFRNONAA >60 09/18/2021   GFRAA >60 01/31/2017    Lab Results  Component Value Date   ALT 40 09/07/2021   AST 38 09/07/2021   ALKPHOS 310 (H) 09/07/2021   BILITOT 0.4 09/07/2021    No results found for: "CRP"  No results found for: "ESRSEDRATE"   I have reviewed the micro and lab results in Epic.  Imaging: No results found.   Imaging independently reviewed in Epic.    Raynelle Highland for Infectious Disease [facility] [phone] pager 09/19/2021, 7:37 AM

## 2021-09-19 NOTE — Progress Notes (Signed)
PHARMACY CONSULT NOTE FOR:  OUTPATIENT  PARENTERAL ANTIBIOTIC THERAPY (OPAT)  Indication: Strep/Enterococcus L-hep osteo, MV IE Regimen: Ampicillin 12g/day as a continuous infusion and Rocephin 2g IV every 12 hours and Metronidazole 500 mg po every 12 hours End date: 10/30/21  IV antibiotic discharge orders are pended. To discharging provider:  please sign these orders via discharge navigator,  Select New Orders & click on the button choice - Manage This Unsigned Work.     Thank you for allowing pharmacy to be a part of this patient's care.  Georgina Pillion, PharmD, BCPS Infectious Diseases Clinical Pharmacist 09/19/2021 10:23 AM   **Pharmacist phone directory can now be found on [email or web address] ([email or web address]).  Listed under [facility].

## 2021-09-19 NOTE — Progress Notes (Addendum)
Pt bed pad and sheets w/ moderate blood. Pt assessed and found to have a small opening between scrotum and buttocks that is draining serosanguineous fluid. Peri care performed, linens changed, gauze loosely applied. Pt states this has happened before, and that is a opening to his urethra? Will monitor closely, if bleeding persist will notify MD

## 2021-09-19 NOTE — Plan of Care (Signed)
  Problem: Education: Goal: Knowledge of General Education information will improve Description: Including pain rating scale, medication(s)/side effects and non-pharmacologic comfort measures Outcome: Progressing   Problem: Health Behavior/Discharge Planning: Goal: Ability to manage health-related needs will improve Outcome: Progressing   Problem: Clinical Measurements: Goal: Diagnostic test results will improve Outcome: Progressing   Problem: Skin Integrity: Goal: Risk for impaired skin integrity will decrease Outcome: Progressing   

## 2021-09-19 NOTE — Anesthesia Postprocedure Evaluation (Signed)
Anesthesia Post Note  Patient: Anthony [name]  Procedure(s) Performed: DEBRIDEMENT LEFT HIP, WOUND VAC EXCHANGE (Left: Hip)     Patient location during evaluation: PACU Anesthesia Type: General Level of consciousness: sedated and patient cooperative Pain management: pain level controlled Vital Signs Assessment: post-procedure vital signs reviewed and stable Respiratory status: spontaneous breathing Cardiovascular status: stable Anesthetic complications: no   No notable events documented.  Last Vitals:  Vitals:   09/19/21 0446 09/19/21 0808  BP: (!) 117/55 (!) 97/51  Pulse: 80 84  Resp: 18   Temp: 37.1 C 36.9 C  SpO2: 96% 99%    Last Pain:  Vitals:   09/19/21 0808  TempSrc: Oral  PainSc:                  Lewie Loron

## 2021-09-19 NOTE — Progress Notes (Signed)
Peripherally Inserted Central Catheter Placement  The IV Nurse has discussed with the patient and/or persons authorized to consent for the patient, the purpose of this procedure and the potential benefits and risks involved with this procedure.  The benefits include less needle sticks, lab draws from the catheter, and the patient may be discharged home with the catheter. Risks include, but not limited to, infection, bleeding, blood clot (thrombus formation), and puncture of an artery; nerve damage and irregular heartbeat and possibility to perform a PICC exchange if needed/ordered by physician.  Alternatives to this procedure were also discussed.  Bard Power PICC patient education guide, fact sheet on infection prevention and patient information card has been provided to patient /or left at bedside.    PICC Placement Documentation  PICC Double Lumen 09/19/21 Right Basilic 36 cm 0 cm (Active)  Indication for Insertion or Continuance of Line Prolonged intravenous therapies 09/19/21 1243  Exposed Catheter (cm) 0 cm 09/19/21 1243  Site Assessment Clean, Dry, Intact 09/19/21 1243  Lumen #1 Status Flushed;Saline locked;Blood return noted 09/19/21 1243  Lumen #2 Status Flushed;Saline locked;Blood return noted 09/19/21 1243  Dressing Type Transparent;Securing device 09/19/21 1243  Dressing Status Antimicrobial disc in place;Clean, Dry, Intact 09/19/21 1243  Dressing Intervention New dressing 09/19/21 1243  Dressing Change Due 09/26/21 09/19/21 1243       Anthony [name]  [name] 09/19/2021, 12:45 PM

## 2021-09-19 NOTE — Progress Notes (Addendum)
Daily Progress Note Intern Pager: [phone]  Patient name: Anthony [name] Medical record number: [id number] Date of birth: [birth year] Age: 51 y.o. Gender: male  Primary Care Provider: Erick Alley, DO Consultants: ortho, ID Code Status: FULL  Pt Overview and Major Events to Date:  8/15: Admitted for sepsis 8/16: Septic shock, transfered to ICU. Started on pressors 8/18: Off pressors, transferred back to FMTS 8/22: Left hip debridement and removal of left hip femoral head 8/25: Planned left hip debridement 8/26: 2 U pRBCs transfused 8/30: repeat debridement  Assessment and Plan: Anthony [name] is a 51 y.o. male with PMHx of paraplegia from spina bifida who was admitted for septic L hip arthritis, complicated by septic shock warranting ICU visit, now back on floor and s/p surgical debridement + repeat debridement with no further interventions planned  * Septic arthritis of hip (HCC) S/P removal of left femoral head 8/22.  Repeat wound debridement today (8/30). No repeat of debridement mentioned on ortho post-op note, to follow up with ortho in 1 week. Wound cultures grew Enterococcus facialis. [name] showed vegetation on the mitral valve and pt will need 6 weeks IV antibiotics. Patient was afebrile overnight. - ID following, appreciate recommendations  - Ortho surgery consulted, appreciate recommendations - Scheduled Tylenol, 5-10 mg Oxy as needed for pain control - continue ampicillin and ceftriaxone for possible native valve IE due to E faecalis per ID - continue abx per above - f/u PICC line placement  Pressure ulcer Chronic stage IV pressure ulcer on left buttock, stage III and stage II ulcers present in sacral region.  Additional wound on dorsum of right foot noted. Wound care consulted, topical wound care orders in place. No further needs from WOC at this time, bedside nursing to perform wound care. - Wound care per bedside nursing  Constipation S/P Ortho surgery.  Opioids  for pain control PRN, though not taking. - Start MiraLAX and senna as needed  Anemia following surgery Hemoglobin now stable, but still tenuous s/p debridement - held lovenox - f/u Hgb - Transfuse if less than 7  Anxiety Patient anxious about undergoing surgery. -  hydroxyzine 25 mg TID PRN  Prolonged Q-T interval on ECG On flecainide for PVC, per cardiology. Curbsided cardiology who advised to monitor QTc on telemetry, difficult to read on his EKG. QTc 460s. - Monitor QTc on tele -- Optimize electrolytes K>4, Mg >2 - Electrolytes have been stable.   FEN/GI: regular PPx: SCDs Dispo:SNF pending clinical improvement .  Subjective:  Patient feeling "good." Has questions about discharge date as well as antibiotic course - questions addressed.  Denies chest pain, shortness of breath. Patient attributed bleeding observed last night to his urethra, which he states is a chronic problem.  Objective: Temp:  [98.2 F (36.8 C)-98.8 F (37.1 C)] 98.4 F (36.9 C) (08/31 0808) Pulse Rate:  [80-95] 84 (08/31 0808) Resp:  [15-18] 18 (08/31 0446) BP: (97-118)/(51-75) 97/51 (08/31 0808) SpO2:  [96 %-99 %] 99 % (08/31 9678) Physical Exam: General: well-appearing and in no acute distress HEENT: normocephalic and atraumatic Cardiovascular: regular rate Respiratory: CTAB anteriorly, normal respiratory effort, and on RA Gastrointestinal: non-tender and non-distended Extremities:  moving bilateral UE spontaneously Neuro: following commands and no focal neurological deficits   Laboratory: Most recent CBC Lab Results  Component Value Date   WBC 10.0 09/19/2021   HGB 7.2 (L) 09/19/2021   HCT 22.9 (L) 09/19/2021   MCV 91.2 09/19/2021   PLT 460 (H) 09/19/2021  Most recent BMP    Latest Ref Rng & Units 09/18/2021    1:43 AM  BMP  Glucose 70 - 99 mg/dL 680   BUN 6 - 20 mg/dL 7   Creatinine 3.21 - 2.24 mg/dL 8.25   Sodium 003 - 704 mmol/L 137   Potassium 3.5 - 5.1 mmol/L 3.8    Chloride 98 - 111 mmol/L 105   CO2 22 - 32 mmol/L 26   Calcium 8.9 - 10.3 mg/dL 7.9     Other pertinent labs none   Imaging/Diagnostic Tests: No new imaging  Augusto Gamble, MD 09/19/2021, 1:28 PM  PGY-1, [facility] FPTS Intern pager: [phone], text pages welcome Secure chat group [facility] Teaching Service

## 2021-09-19 NOTE — TOC Progression Note (Addendum)
Transition of Care [facility]) - Progression Note    Patient Details  Name: Anthony [name] MRN: [id number] Date of Birth: 1970/03/05  Transition of [facility]) CM/SW Contact  Lorri Frederick, LCSW Phone Number: 09/19/2021, 10:47 AM  Clinical Narrative:   [name] spoke with pt regarding SNF bed offers, pt again saying that he does not want SNF and can do IV abx at home.  [name] spoke with Pam Chandler/[employer]--the reason they suggested SNF was that pt lacks medication coverage, would need to pay $26 per day out of pocket to do home infusion, would be covered at [facility].  [name] reminded pt of this and he confirms that he cannot afford that amount for home and will agree to SNF.  Pt will look at bed offers and make decision.  1100: Pt accepts bed offer at [facility].  Kathy/[facility] notified.  Expected Discharge Plan: Skilled Nursing Facility Barriers to Discharge: SNF Pending bed offer, Continued Medical Work up  Expected Discharge Plan and Services Expected Discharge Plan: Skilled Nursing Facility   Discharge Planning Services: CM Consult Post Acute Care Choice: Home Health Living arrangements for the past 2 months: Apartment                 DME Arranged: Government social research officer DME Agency: [employer] Date DME Agency Contacted: 09/05/21 Time DME Agency Contacted: 1410 Representative spoke with at DME Agency: Arnold Long HH Arranged: PT, RN HH Agency: [facility] Date [facility] Agency Contacted: 09/05/21 Time HH Agency Contacted: 1300 Representative spoke with at [facility] [place] Agency: Candise Bowens   Social Determinants of Health (SDOH) Interventions    Readmission Risk Interventions     No data to display

## 2021-09-19 NOTE — Evaluation (Signed)
Occupational Therapy Re-Evaluation Patient Details Name: Anthony [name] MRN: [id number] DOB: [birth year] Today's Date: 09/19/2021   History of Present Illness Pt is a 51 y/o male admitted secondary to sepsis likely from chronic wounds. Pt with necrotic L femur and acetabulum.On 8/24, pt underwent Girdlestone amputation left hip with partial excision of the acetabulum, I&D with multiple organisms noted. Likely planned staged I&D of L hip during admission. PMH includes  spina bifida and paraplegia, neurogenic bladder with suprapubic catheter, complicated by chronic sacral wounds, chronic hip wound.   Clinical Impression   Pt requested to hold therapy attempts until after I&D on 8/30, so initiated re-evaluation today. Pt agreeable for OOB transfer with use of personal wheelchair. However, as pt's personal wheelchair brakes do no lock properly, +2 assist provided for safety and line mgmt with scoot transfers completed with Min A trialed sliding board though pt did not like this method). Once in wheelchair, pt able to mobilize with assist for lines only - would benefit from drop arm recliner in room. Pt agreeable for SNF at [place] for antibiotics and wound care. Pt continues to inquire about receiving a new, specialized wheelchair and feel he would benefit from this DME as current w/c setup is not safe.     Recommendations for follow up therapy are one component of a multi-disciplinary discharge planning process, led by the attending physician.  Recommendations may be updated based on patient status, additional functional criteria and insurance authorization.   Follow Up Recommendations  Skilled nursing-short term rehab (<3 hours/day) (for antibiotics and wound care; likely no SNF OT needs)    Assistance Recommended at Discharge Intermittent Supervision/Assistance  Patient can return home with the following A little help with walking and/or transfers;A little help with bathing/dressing/bathroom;Assist for  transportation    Functional Status Assessment  Patient has had a recent decline in their functional status and demonstrates the ability to make significant improvements in function in a reasonable and predictable amount of time.  Equipment Recommendations  Wheelchair (measurements OT);Wheelchair cushion (measurements OT) (heavy duty, lightweight manual wheelchair with pressure relieving cushion)    Recommendations for Other Services       Precautions / Restrictions Precautions Precautions: Fall Precaution Comments: wound vac, hx of paraplegia, suprapubic catheter, PICC Restrictions Weight Bearing Restrictions: No Other Position/Activity Restrictions: paraplegic      Mobility Bed Mobility Overal bed mobility: Needs Assistance Bed Mobility: Supine to Sit, Sit to Supine     Supine to sit: Modified independent (Device/Increase time) Sit to supine: Modified independent (Device/Increase time)   General bed mobility comments: ModI to progress from supine to long sitting and use of BUE support of hand rails    Transfers Overall transfer level: Needs assistance Equipment used: None Transfers: Bed to chair/wheelchair/BSC            Lateral/Scoot Transfers: Min assist General transfer comment: Min A to/from personal w/c with (+2 for safety to block w/c due to brakes not working properly and for [employer]). cues for monitoring LE positioning during transfer      Balance Overall balance assessment: Needs assistance Sitting-balance support: No upper extremity supported, Feet unsupported Sitting balance-Leahy Scale: Fair                                     ADL either performed or assessed with clinical judgement   ADL Overall ADL's : Needs assistance/impaired  Toileting- Clothing Manipulation and Hygiene: Minimal assistance;Sitting/lateral lean;Bed level Toileting - Clothing Manipulation Details (indicate cue type and  reason): reports using bedpad then clean up with washcloths       General ADL Comments: trial of OOB transfer to/from personal chair     Vision Baseline Vision/History: 0 No visual deficits Ability to See in Adequate Light: 0 Adequate Patient Visual Report: No change from baseline Vision Assessment?: No apparent visual deficits     Perception     Praxis      Pertinent Vitals/Pain Pain Assessment Pain Assessment: Faces Faces Pain Scale: Hurts a little bit Pain Location: L hip Pain Descriptors / Indicators: Sore Pain Intervention(s): Monitored during session     Hand Dominance Right   Extremity/Trunk Assessment Upper Extremity Assessment Upper Extremity Assessment: Overall WFL for tasks assessed   Lower Extremity Assessment Lower Extremity Assessment: Defer to PT evaluation RLE Deficits / Details: No AROM at baseline secondary to spina bifida LLE Deficits / Details: No AROM at baseline secondary to spina bifida   Cervical / Trunk Assessment Cervical / Trunk Assessment: Kyphotic;Other exceptions Cervical / Trunk Exceptions: spina bifida   Communication Communication Communication: No difficulties   Cognition Arousal/Alertness: Awake/alert Behavior During Therapy: WFL for tasks assessed/performed Overall Cognitive Status: No family/caregiver present to determine baseline cognitive functioning                                 General Comments: decreased engagement in therapy importance, some decreased awareness of safety (transfers w/ w/c lock broken, etc)     General Comments       Exercises     Shoulder Instructions      Home Living Family/patient expects to be discharged to:: Private residence Living Arrangements: Alone Available Help at Discharge: Family;Available PRN/intermittently Type of Home: Apartment Home Access: Ramped entrance     Home Layout: One level     Bathroom Shower/Tub: Chief Strategy Officer: Standard      Home Equipment: Wheelchair - manual;Grab bars - tub/shower;Transport chair          Prior Functioning/Environment Prior Level of Function : Independent/Modified Independent             Mobility Comments: able to perform transfers in/out of his WC without difficulty. Does not use slide board ADLs Comments: sits in the tub for bathing, able to complete ADLs w/o assist per pt. takes w/c to/from grocery store, etc        OT Problem List: Decreased strength;Decreased activity tolerance;Impaired balance (sitting and/or standing);Decreased safety awareness;Impaired sensation      OT Treatment/Interventions: Self-care/ADL training;Therapeutic exercise;Energy conservation;DME and/or AE instruction;Therapeutic activities;Patient/family education;Balance training    OT Goals(Current goals can be found in the care plan section) Acute Rehab OT Goals Patient Stated Goal: get a new w/c OT Goal Formulation: With patient Time For Goal Achievement: 10/02/21 Potential to Achieve Goals: Good  OT Frequency: Min 2X/week    Co-evaluation PT/OT/SLP Co-Evaluation/Treatment: Yes Reason for Co-Treatment: For patient/therapist safety;To address functional/ADL transfers PT goals addressed during session: Mobility/safety with mobility OT goals addressed during session: ADL's and self-care      AM-PAC OT "6 Clicks" Daily Activity     Outcome Measure Help from another person eating meals?: None Help from another person taking care of personal grooming?: A Little Help from another person toileting, which includes using toliet, bedpan, or urinal?: A Little Help from another person bathing (including  washing, rinsing, drying)?: A Little Help from another person to put on and taking off regular upper body clothing?: A Little Help from another person to put on and taking off regular lower body clothing?: A Little 6 Click Score: 19   End of Session Nurse Communication: Mobility status  Activity  Tolerance: Patient tolerated treatment well Patient left: in bed;with call bell/phone within reach  OT Visit Diagnosis: Muscle weakness (generalized) (M62.81);Pain                Time: [id number] OT Time Calculation (min): 32 min Charges:  OT General Charges $OT Visit: 1 Visit OT Evaluation $OT Re-eval: 1 Re-eval  Malachy Chamber, OTR/L Acute Rehab Services Office: [phone]   Layla Maw 09/19/2021, 3:03 PM

## 2021-09-19 NOTE — Evaluation (Addendum)
Physical Therapy Re-evaluation Patient Details Name: Anthony [name] MRN: [id number] DOB: Jan 24, 1970 Today's Date: 09/19/2021  History of Present Illness  Pt is a 51 y/o male admitted secondary to sepsis likely from chronic wounds. Pt with necrotic L femur and acetabulum.On 8/24, pt underwent Girdlestone amputation left hip with partial excision of the acetabulum, I&D with multiple organisms noted. Further I&D on 8/30 to L hip. PMH includes  spina bifida and paraplegia, neurogenic bladder with suprapubic catheter, complicated by chronic sacral wounds, chronic hip wound.  Clinical Impression  Pt agreeable to physical therapy re-evaluation session. Pt performing bed <> wheelchair transfer via lateral scooting with minimal assistance. Pt able to propel wheelchair in hall independently x 200 feet. Pt currently presents with functional limitations secondary to impairments listed in PT problem list. Pt to benefit from skilled, acute care physical therapy interventions to maximize his independence level and quality of life. Will progress to transfers from bed to a drop arm chair.     Recommendations for follow up therapy are one component of a multi-disciplinary discharge planning process, led by the attending physician.  Recommendations may be updated based on patient status, additional functional criteria and insurance authorization.  Follow Up Recommendations Skilled nursing-short term rehab (<3 hours/day) Can patient physically be transported by private vehicle: No    Assistance Recommended at Discharge Intermittent Supervision/Assistance  Patient can return home with the following  A little help with walking and/or transfers;A little help with bathing/dressing/bathroom;Assistance with cooking/housework;Assist for transportation;Help with stairs or ramp for entrance    Equipment Recommendations None recommended by PT  Recommendations for Other Services       Functional Status Assessment Patient  has had a recent decline in their functional status and demonstrates the ability to make significant improvements in function in a reasonable and predictable amount of time.     Precautions / Restrictions Precautions Precautions: Fall Precaution Comments: wound vac, hx of paraplegia, suprapubic catheter, PICC Restrictions Weight Bearing Restrictions: No Other Position/Activity Restrictions: paraplegic      Mobility  Bed Mobility Overal bed mobility: Needs Assistance Bed Mobility: Supine to Sit     Supine to sit: Modified independent (Device/Increase time)     General bed mobility comments: Pt able to complete supine to long sitting with B UE use    Transfers Overall transfer level: Needs assistance Equipment used: None Transfers: Bed to chair/wheelchair/BSC            Lateral/Scoot Transfers: Min assist, From elevated surface, +2 safety/equipment General transfer comment: Pt performed bed <> wheelchair transfer via lateral scooting. Pt required min assist for R LE management from wheelchair to bed. Otherwise, mostly CGA utilized and required holding down of pt's personal WC due to WC instability.    Ambulation/Gait                  Administrator mobility: Yes Wheelchair propulsion: Both upper extremities Wheelchair parts: Independent Distance: 200 feet  Modified Rankin (Stroke Patients Only)       Balance Overall balance assessment: Needs assistance Sitting-balance support: No upper extremity supported, Feet unsupported Sitting balance-Leahy Scale: Fair                                       Pertinent Vitals/Pain Pain Assessment Pain Assessment: 0-10 Pain Score: 2  Pain  Location: L hip Pain Descriptors / Indicators: Sore Pain Intervention(s): Monitored during session, Limited activity within patient's tolerance    Home Living Family/patient expects to be discharged  to:: Private residence Living Arrangements: Alone Available Help at Discharge: Family;Available PRN/intermittently Type of Home: Apartment Home Access: Ramped entrance       Home Layout: One level Home Equipment: Wheelchair - manual;Grab bars - tub/shower;Transport chair      Prior Function Prior Level of Function : Independent/Modified Independent             Mobility Comments: able to perform transfers in/out of his WC without difficulty. Does not use slide board ADLs Comments: sits in the tub for bathing, able to complete ADLs w/o assist per pt. takes w/c to/from grocery store, etc     Hand Dominance   Dominant Hand: Right    Extremity/Trunk Assessment   Upper Extremity Assessment Upper Extremity Assessment: Overall WFL for tasks assessed    Lower Extremity Assessment Lower Extremity Assessment: Defer to PT evaluation RLE Deficits / Details: No AROM at baseline secondary to spina bifida LLE Deficits / Details: No AROM at baseline secondary to spina bifida    Cervical / Trunk Assessment Cervical / Trunk Assessment: Kyphotic;Other exceptions Cervical / Trunk Exceptions: spina bifida  Communication   Communication: No difficulties  Cognition Arousal/Alertness: Awake/alert Behavior During Therapy: WFL for tasks assessed/performed Overall Cognitive Status: No family/caregiver present to determine baseline cognitive functioning                                 General Comments: decreased engagement in therapy importance        General Comments      Exercises     Assessment/Plan    PT Assessment Patient needs continued PT services  PT Problem List Decreased strength;Decreased activity tolerance;Decreased mobility;Decreased balance;Decreased safety awareness;Decreased knowledge of use of DME;Decreased knowledge of precautions;Pain;Decreased skin integrity       PT Treatment Interventions DME instruction;Functional mobility training;Therapeutic  activities;Therapeutic exercise;Balance training;Patient/family education;Wheelchair mobility training    PT Goals (Current goals can be found in the Care Plan section)  Acute Rehab PT Goals Patient Stated Goal: to go home PT Goal Formulation: With patient Time For Goal Achievement: 09/28/21 Potential to Achieve Goals: Fair    Frequency Min 3X/week     Co-evaluation PT/OT/SLP Co-Evaluation/Treatment: Yes Reason for Co-Treatment: For patient/therapist safety;To address functional/ADL transfers PT goals addressed during session: Mobility/safety with mobility OT goals addressed during session: ADL's and self-care       AM-PAC PT "6 Clicks" Mobility  Outcome Measure Help needed turning from your back to your side while in a flat bed without using bedrails?: A Little Help needed moving from lying on your back to sitting on the side of a flat bed without using bedrails?: A Little Help needed moving to and from a bed to a chair (including a wheelchair)?: A Little Help needed standing up from a chair using your arms (e.g., wheelchair or bedside chair)?: Total Help needed to walk in hospital room?: Total Help needed climbing 3-5 steps with a railing? : Total 6 Click Score: 12    End of Session   Activity Tolerance: Patient tolerated treatment well Patient left: in bed;with call bell/phone within reach Nurse Communication: Mobility status PT Visit Diagnosis: Muscle weakness (generalized) (M62.81);Other abnormalities of gait and mobility (R26.89);Pain Pain - Right/Left: Left Pain - part of body: Hip    Time:  7425-9563 PT Time Calculation (min) (ACUTE ONLY): 28 min   Charges:   PT Evaluation $PT Re-evaluation: 1 Re-eval          Tana Coast, PT   [employer] 09/19/2021, 3:10 PM

## 2021-09-20 DIAGNOSIS — A4181 Sepsis due to Enterococcus: Secondary | ICD-10-CM | POA: Diagnosis not present

## 2021-09-20 DIAGNOSIS — I330 Acute and subacute infective endocarditis: Secondary | ICD-10-CM | POA: Diagnosis not present

## 2021-09-20 DIAGNOSIS — I10 Essential (primary) hypertension: Secondary | ICD-10-CM | POA: Diagnosis not present

## 2021-09-20 DIAGNOSIS — R6521 Severe sepsis with septic shock: Secondary | ICD-10-CM | POA: Diagnosis not present

## 2021-09-20 DIAGNOSIS — L89224 Pressure ulcer of left hip, stage 4: Secondary | ICD-10-CM | POA: Diagnosis not present

## 2021-09-20 DIAGNOSIS — M009 Pyogenic arthritis, unspecified: Secondary | ICD-10-CM | POA: Diagnosis not present

## 2021-09-20 DIAGNOSIS — L89893 Pressure ulcer of other site, stage 3: Secondary | ICD-10-CM | POA: Diagnosis not present

## 2021-09-20 DIAGNOSIS — K7200 Acute and subacute hepatic failure without coma: Secondary | ICD-10-CM | POA: Diagnosis not present

## 2021-09-20 DIAGNOSIS — N179 Acute kidney failure, unspecified: Secondary | ICD-10-CM | POA: Diagnosis not present

## 2021-09-20 DIAGNOSIS — Z20822 Contact with and (suspected) exposure to covid-19: Secondary | ICD-10-CM | POA: Diagnosis not present

## 2021-09-20 DIAGNOSIS — L89154 Pressure ulcer of sacral region, stage 4: Secondary | ICD-10-CM | POA: Diagnosis not present

## 2021-09-20 DIAGNOSIS — G8220 Paraplegia, unspecified: Secondary | ICD-10-CM | POA: Diagnosis not present

## 2021-09-20 LAB — CBC
HCT: 22.2 % — ABNORMAL LOW (ref 39.0–52.0)
Hemoglobin: 6.8 g/dL — CL (ref 13.0–17.0)
MCH: 28.8 pg (ref 26.0–34.0)
MCHC: 30.6 g/dL (ref 30.0–36.0)
MCV: 94.1 fL (ref 80.0–100.0)
Platelets: 429 10*3/uL — ABNORMAL HIGH (ref 150–400)
RBC: 2.36 MIL/uL — ABNORMAL LOW (ref 4.22–5.81)
RDW: 19.9 % — ABNORMAL HIGH (ref 11.5–15.5)
WBC: 7.3 10*3/uL (ref 4.0–10.5)
nRBC: 0 % (ref 0.0–0.2)

## 2021-09-20 LAB — PREPARE RBC (CROSSMATCH)

## 2021-09-20 LAB — HEMOGLOBIN AND HEMATOCRIT, BLOOD
HCT: 23.3 % — ABNORMAL LOW (ref 39.0–52.0)
Hemoglobin: 7.5 g/dL — ABNORMAL LOW (ref 13.0–17.0)

## 2021-09-20 MED ORDER — SODIUM CHLORIDE 0.9% IV SOLUTION: Freq: Once | INTRAVENOUS | Status: AC

## 2021-09-20 NOTE — Progress Notes (Signed)
Date and time results received: 09/20/21 0525  Reported by: Doristine Church, Lab Reported to: Pamelia Hoit, RN   Test: Hemoglobin  Critical Value: 6.8  Name of Provider Notified: MD aware. Glendale Chard, DO placed orders   Orders Received? Or Actions Taken?: Yes, Transfuse 1 unit RBC

## 2021-09-20 NOTE — Progress Notes (Signed)
Daily Progress Note Intern Pager: [phone]  Patient name: Anthony [name] Medical record number: [id number] Date of birth: Apr 23, 1970 Age: 51 y.o. Gender: male  Primary Care Provider: Erick Alley, DO Consultants: ortho, ID Code Status: FULL  Pt Overview and Major Events to Date:  8/15: Admitted for sepsis 8/16: Septic shock, transfered to ICU. Started on pressors 8/18: Off pressors, transferred back to FMTS 8/22: Left hip debridement and removal of left hip femoral head 8/25: Planned left hip debridement 8/26: 2 U pRBCs transfused 8/30: repeat debridement 8/31: 1 U pRBCs transfused  Assessment and Plan: Anthony [name] is a 51 y.o. male with PMHx of paraplegia from spina bifida who was admitted for septic L hip arthritis, complicated by septic shock warranting ICU visit, now back on floor and s/p surgical debridement + repeat debridement with no further interventions planned  Pertinent PMH/PSH includes  * Septic arthritis of hip (HCC) S/p removal of left femoral head 8/22 with repeat wound debridement and wound cultures with Enterococcus facialis. [name] with mitral valve vegetation. - ID following - Ortho following - needs 6 weeks IV antibiotics. PICC line placed.  - Scheduled Tylenol, 5-10 mg Oxy as needed for pain control - continue ampicillin and ceftriaxone for possible native valve IE due to E faecalis per ID   Anemia following surgery Anemic after surgery. Hgb at goal s/p transfusion this AM. Iron studies consistent with anemia of chronic disease.  - holding DVT Ppx - Transfuse if less than 7 - Continue to follow Hgb   Constipation S/P Ortho surgery.  - Scheduled colace - PRN dulcolax and senna  Pressure ulcer Chronic stage IV pressure ulcer on left buttock, stage III and stage II ulcers present in sacral region.  Additional wound on dorsum of right foot noted.  -Bedside nursing performing wound care   Prolonged Q-T interval on ECG Had prolonged Q-T interval  on ECG on admission and flecainide was held due to it QT prolonging potential. However, patient with PVCs. Cardiology curbsided, recommended continuing monitor QTc on telemetry and restarting flecainide.  --on flecainide  --repeat EKG  --restart telemetry to monitor QTc    FEN/GI: regular PPx: SCDs Dispo:SNF pending clinical improvement . Barriers include requiring ongoing transfusions.   Subjective:  Hgb 6.8 overnight requiring 1U pRBCs transfused this AM. He denies any acute pain. He reports good appetite. Discussed no discharge to SNF today given need for transfusion overnight. He would prefer to go to SNF after the weekend. Denies chest pain, SOB. Blood on bed with dressing off. Discussed with nursing to change dressing on L hip.   Objective: Temp:  [98.4 F (36.9 C)-99.2 F (37.3 C)] 99.2 F (37.3 C) (09/01 0837) Pulse Rate:  [71-94] 92 (09/01 0837) Resp:  [18] 18 (09/01 0727) BP: (95-127)/(52-72) 127/69 (09/01 0837) SpO2:  [97 %-100 %] 98 % (09/01 0837)  Physical Exam: General: alert, in no acute distress Cardiovascular: regular rate and rhythm Respiratory: Normal WOB on RA.  Abdomen: Soft, tender, non-distended  Extremities: moving bilateral UE spontaneously. L hip with blood on dressing in bed.   Laboratory: Most recent CBC Lab Results  Component Value Date   WBC 7.3 09/20/2021   HGB 7.5 (L) 09/20/2021   HCT 23.3 (L) 09/20/2021   MCV 94.1 09/20/2021   PLT 429 (H) 09/20/2021   Most recent BMP    Latest Ref Rng & Units 09/18/2021    1:43 AM  BMP  Glucose 70 - 99 mg/dL 992  BUN 6 - 20 mg/dL 7   Creatinine 0.53 - 9.76 mg/dL 7.34   Sodium 193 - 790 mmol/L 137   Potassium 3.5 - 5.1 mmol/L 3.8   Chloride 98 - 111 mmol/L 105   CO2 22 - 32 mmol/L 26   Calcium 8.9 - 10.3 mg/dL 7.9     Other pertinent labs none   Imaging/Diagnostic Tests: No new imaging  Karie Fetch, MD 09/20/2021, 2:16 PM  PGY-1, [facility] Family Medicine FPTS Intern pager: [phone],  text pages welcome Secure chat group [facility] Teaching Service

## 2021-09-20 NOTE — TOC Progression Note (Signed)
Transition of Care [facility]) - Progression Note    Patient Details  Name: Anthony [name] MRN: [id number] Date of Birth: 06-Sep-1970  Transition of Care [facility]) CM/SW Contact  Lorri Frederick, LCSW Phone Number: 09/20/2021, 11:08 AM  Clinical Narrative:   No DC today, per MD. Per Kathy/[facility], they can accept pt if ready for DC over the weekend.    Expected Discharge Plan: Skilled Nursing Facility Barriers to Discharge: SNF Pending bed offer, Continued Medical Work up  Expected Discharge Plan and Services Expected Discharge Plan: Skilled Nursing Facility   Discharge Planning Services: CM Consult Post Acute Care Choice: Home Health Living arrangements for the past 2 months: Apartment                 DME Arranged: Government social research officer DME Agency: [employer] Date DME Agency Contacted: 09/05/21 Time DME Agency Contacted: 1410 Representative spoke with at DME Agency: Arnold Long HH Arranged: PT, RN HH Agency: [facility] Date [facility] Agency Contacted: 09/05/21 Time HH Agency Contacted: 1300 Representative spoke with at [facility] Agency: Candise Bowens   Social Determinants of Health (SDOH) Interventions    Readmission Risk Interventions     No data to display

## 2021-09-21 DIAGNOSIS — M009 Pyogenic arthritis, unspecified: Secondary | ICD-10-CM | POA: Diagnosis not present

## 2021-09-21 LAB — CBC
HCT: 26.1 % — ABNORMAL LOW (ref 39.0–52.0)
Hemoglobin: 7.8 g/dL — ABNORMAL LOW (ref 13.0–17.0)
MCH: 28.1 pg (ref 26.0–34.0)
MCHC: 29.9 g/dL — ABNORMAL LOW (ref 30.0–36.0)
MCV: 93.9 fL (ref 80.0–100.0)
Platelets: 400 10*3/uL (ref 150–400)
RBC: 2.78 MIL/uL — ABNORMAL LOW (ref 4.22–5.81)
RDW: 19.2 % — ABNORMAL HIGH (ref 11.5–15.5)
WBC: 8.8 10*3/uL (ref 4.0–10.5)
nRBC: 0 % (ref 0.0–0.2)

## 2021-09-21 NOTE — Progress Notes (Addendum)
Daily Progress Note Intern Pager: [phone]  Patient name: Anthony [name] Medical record number: [id number] Date of birth: 10-07-1970 Age: 51 y.o. Gender: male  Primary Care Provider: Erick Alley, DO Consultants: ortho, ID Code Status: Full code  Pt Overview and Major Events to Date:  8/15: Admitted for sepsis 8/16: Septic shock, transfered to ICU. Started on pressors 8/18: Off pressors, transferred back to FMTS 8/22: Left hip debridement and removal of left hip femoral head 8/25: Planned left hip debridement 8/26: 2 U pRBCs transfused 8/30: repeat debridement 8/31: 1 U pRBCs transfused  Assessment and Plan:  Anthony [name] is a 51 y.o. male with PMHx of paraplegia from spina bifida who was admitted for septic L hip arthritis, complicated by septic shock warranting ICU visit, now back on floor and s/p surgical debridement + repeat debridement with no further interventions planned  * Septic arthritis of hip (HCC) Stable. S/p removal of left femoral head 8/22 with repeat wound debridement and wound cultures with Enterococcus facialis. [name] with mitral valve vegetation. - ID following - Ortho following - needs 6 weeks IV antibiotics for endocarditis, PICC line placed.  - Scheduled Tylenol, 5-10 mg Oxy as needed for pain control - continue ampicillin and ceftriaxone for possible native valve IE due to E faecalis per ID  Anemia following surgery Stable. Anemic after surgery. 1 unit PRBC 9/1 improving Hgb to 7.5. Iron studies consistent with anemia of chronic disease.  - holding DVT Ppx - Transfuse if less than 7 - Continue to follow Hgb and recheck outpatient   Constipation S/P Ortho surgery.  - Scheduled colace - PRN dulcolax and senna  Pressure ulcer Chronic stage IV pressure ulcer on left buttock, stage III and stage II ulcers present in sacral region.  Additional wound on dorsum of right foot noted.  -Bedside nursing performing wound care   Prolonged Q-T interval  on ECG Stable. Had prolonged Q-T interval on ECG on admission and flecainide was held due to it QT prolonging potential. However, patient with PVCs. Cardiology curbsided, recommended continuing monitor QTc on telemetry and restarting flecainide.  --on flecainide  --repeat EKG  --restart telemetry to monitor QTc    FEN/GI: regular PPx: SCDs Dispo:SNF today. Barriers include none.   Subjective:  Patient resting comfortably.  No acute events overnight.  Patient has no new concerns today.  He denies chills and fevers overnight.  He denies being in pain.  He is still agreeable to go to SNF.  Objective: Temp:  [98.4 F (36.9 C)-99.2 F (37.3 C)] 98.8 F (37.1 C) (09/01 1954) Pulse Rate:  [82-92] 84 (09/01 1954) Resp:  [18-20] 20 (09/01 1954) BP: (95-127)/(52-74) 114/63 (09/01 1954) SpO2:  [98 %-100 %] 98 % (09/01 1954) Physical Exam: General: Chronically ill-appearing, no acute distress Cardiovascular: Regular rate, regular rhythm, no murmurs on exam Respiratory: Clear, no wheezing, no crackles, no consolidations.  No increased work of breathing Abdomen: Soft, nontender, nondistended Extremities: No peripheral edema  Laboratory: Most recent CBC Lab Results  Component Value Date   WBC 7.3 09/20/2021   HGB 7.5 (L) 09/20/2021   HCT 23.3 (L) 09/20/2021   MCV 94.1 09/20/2021   PLT 429 (H) 09/20/2021   Most recent BMP    Latest Ref Rng & Units 09/18/2021    1:43 AM  BMP  Glucose 70 - 99 mg/dL 119   BUN 6 - 20 mg/dL 7   Creatinine 4.17 - 4.08 mg/dL 1.44   Sodium 818 - 563 mmol/L  137   Potassium 3.5 - 5.1 mmol/L 3.8   Chloride 98 - 111 mmol/L 105   CO2 22 - 32 mmol/L 26   Calcium 8.9 - 10.3 mg/dL 7.9     Glendale Chard, DO 09/21/2021, 3:43 AM  PGY-1, [facility] Family Medicine FPTS Intern pager: [phone], text pages welcome Secure chat group [facility] Teaching Service

## 2021-09-21 NOTE — Plan of Care (Signed)

## 2021-09-21 NOTE — Discharge Instructions (Signed)
Dear Anthony [name],  Thank you for letting us participate in your care. You were hospitalized for fever/chills and worsening left hip wound and diagnosed with Septic arthritis of hip (HCC). You were treated with surgery, IV antibiotics, and blood transfusions.   POST-HOSPITAL & CARE INSTRUCTIONS Surgery: You will need to follow-up outpatient with Dr. [name] at the appointment listed below.  We are discharging you home with Tylenol and ibuprofen.  You can take these regularly.  I am also sending you home with 5 mg oxycodone.  You can use this as needed for breakthrough pain. Endocarditis: We found some infection in your heart that would require 6 weeks of IV antibiotics.  You are being discharged with a PICC line and 12 g/day ampicillin as a continuous infusion and 2 g Rocephin IV twice a day.  You should also be taking an antibiotic pill called metronidazole every 12 hours. You are going to be going to the skilled nursing facility to help with your recovery and your mobility.  Physical therapy and Occupational Therapy will come and see you. Continue your care of your wounds on your left buttock, sacral region, and foot.  We will be discharging you home with supplies. Your blood levels were low during this hospitalization and we had to give you many units of blood.  At discharge your hemoglobin is stable but will need to be rechecked outpatient.  If you start feeling abnormally weak or notice dark black or bloody stools call your doctor and let them know immediately. Go to your follow up appointments (listed below)   DOCTOR'S APPOINTMENT   Future Appointments  Date Time Provider Department Center  09/26/2021  3:00 PM Quintella Reichert, MD [facility]  10/10/2021  3:45 PM Kathlynn Grate, DO [facility]  11/08/2021  2:30 PM Elberta Fortis, Andree Coss, MD [facility]    Follow-up Information     [facility] Follow up.   Specialty: Home Health  Services Why: Someone will call you to schedule first home visit. Contact information: [address] [place] [address] [phone]         [name], MD Follow up in 1 week(s).   Specialty: Orthopedic Surgery Contact information: [address] [place] [address] [phone]                 Take care and be well!  Family Medicine Teaching Service Inpatient Team [place]  [facility]  [address] [place], [place] [address] [phone]

## 2021-09-21 NOTE — Progress Notes (Signed)
FMTS Interim Progress Note  S:Notified regarding wound vac concerns. Nurse worried that there is stool around it along with drainage. Went to bedside to see patient who denies any concerns.   O: BP 126/78 (BP Location: Left Arm)   Pulse 88   Temp 99.4 F (37.4 C)   Resp 16   Ht 5\' 8"  (1.727 m)   Wt 87.7 kg   SpO2 98%   BMI 29.40 kg/m   General: Patient resting in bed comfortably, smiling and in no acute distress. Ext: wound vac in place with little to no surrounding drainage, no stool noted from the wound vac, no bleeding or other drainage from wound vac  A/P: Wound vac looked appropriate but I still would prefer that surgery take a look to ensure it is draining appropriately. Conveyed this to nurse and shared with her that calling them can wait until the morning, she will convey this to the day nurse and she was appreciative. Will continue to monitor.   , DO 09/21/2021, 10:29 PM PGY-3, [facility] Family Medicine Service pager [phone]

## 2021-09-21 NOTE — TOC Progression Note (Signed)
Transition of Care [facility]) - Progression Note    Patient Details  Name: Anthony [name] MRN: [id number] Date of Birth: December 31, 1970  Transition of Care [facility]) CM/SW Contact  Donnalee Curry, LCSWA Phone Number: 09/21/2021, 2:55 PM  Clinical Narrative:     Pt unable to d/c due to need for wound vac to left hip. SW spoke with Olegario Messier [facility]) confirmed they do not have wound vac ordered  Expected Discharge Plan: Skilled Nursing Facility Barriers to Discharge: SNF Pending bed offer, Continued Medical Work up  Expected Discharge Plan and Services Expected Discharge Plan: Skilled Nursing Facility   Discharge Planning Services: CM Consult Post Acute Care Choice: Home Health Living arrangements for the past 2 months: Apartment                 DME Arranged: Government social research officer DME Agency: [employer] Date DME Agency Contacted: 09/05/21 Time DME Agency Contacted: 1410 Representative spoke with at DME Agency: Arnold Long HH Arranged: PT, RN HH Agency: [facility] Date [facility] Agency Contacted: 09/05/21 Time HH Agency Contacted: 1300 Representative spoke with at [facility] Agency: Candise Bowens   Social Determinants of Health (SDOH) Interventions    Readmission Risk Interventions     No data to display

## 2021-09-22 DIAGNOSIS — M009 Pyogenic arthritis, unspecified: Secondary | ICD-10-CM | POA: Diagnosis not present

## 2021-09-22 LAB — TYPE AND SCREEN
ABO/RH(D): O POS
Antibody Screen: NEGATIVE
Donor AG Type: NEGATIVE
Donor AG Type: NEGATIVE
Unit division: 0
Unit division: 0
Unit division: 0
Unit division: 0

## 2021-09-22 LAB — BPAM RBC
Blood Product Expiration Date: 202309272359
Blood Product Expiration Date: 202309282359
Blood Product Expiration Date: 202310012359
Blood Product Expiration Date: 202310032359
ISSUE DATE / TIME: 202309010550
Unit Type and Rh: 5100
Unit Type and Rh: 5100
Unit Type and Rh: 5100
Unit Type and Rh: 5100

## 2021-09-22 LAB — CBC
HCT: 25.7 % — ABNORMAL LOW (ref 39.0–52.0)
Hemoglobin: 7.8 g/dL — ABNORMAL LOW (ref 13.0–17.0)
MCH: 28.5 pg (ref 26.0–34.0)
MCHC: 30.4 g/dL (ref 30.0–36.0)
MCV: 93.8 fL (ref 80.0–100.0)
Platelets: 372 10*3/uL (ref 150–400)
RBC: 2.74 MIL/uL — ABNORMAL LOW (ref 4.22–5.81)
RDW: 19.6 % — ABNORMAL HIGH (ref 11.5–15.5)
WBC: 8.6 10*3/uL (ref 4.0–10.5)
nRBC: 0 % (ref 0.0–0.2)

## 2021-09-22 NOTE — Progress Notes (Signed)
Patient ID: Anthony [name], male   DOB: 1970/10/25, 51 y.o.   MRN: [id number] POD#3 S/p Debridement left hip and wound closure with Kerecis tissue graft.  Asked by nursing to evaluate wound vac site that gotten soiled. Part of dressing cut away without compromising the wound VAC seal.  Canister was empty.  Continue current care with discharge to skilled nursing facility with wound VAC.

## 2021-09-22 NOTE — Progress Notes (Signed)
On first assessment, noted transparent edge of the wound vac dressing had stool under it. RN able to clean the stool out from edge of dressing. Dressing around foam part clean/intact and good seal to suction of wound vac, no leaking noted. Paged night MD and provider at bedside to assess. Per MD - notify day RN to page surgery in morning unless any other changes tonight.   No changes noted overnight and at 0500 assessment dressing still remains unchanged.

## 2021-09-22 NOTE — Plan of Care (Signed)

## 2021-09-22 NOTE — Progress Notes (Addendum)
Daily Progress Note Intern Pager: [phone]  Patient name: Anthony [name] Medical record number: [id number] Date of birth: 09/24/1970 Age: 51 y.o. Gender: male  Primary Care Provider: Erick Alley, DO Consultants: ID, orthopedics  Code Status: Full   Pt Overview and Major Events to Date:  8/15: Admitted for sepsis 8/16: Septic shock, transfered to ICU. Started on pressors 8/18: Off pressors, transferred back to FMTS 8/22: Left hip debridement and removal of left hip femoral head 8/25: Planned left hip debridement 8/26: 2 U pRBCs transfused 8/30: repeat debridement 8/31: 1 U pRBCs transfused  Assessment and Plan: Anthony [name] is a 51 y.o. male with PMHx of paraplegia from spina bifida who was admitted for septic L hip arthritis, complicated by septic shock warranting ICU visit, now back on floor and s/p surgical debridement and repeat debridement with no further interventions planned.  * Septic arthritis of hip (HCC) Stable. S/p removal of left femoral head 8/22 with repeat wound debridement and wound cultures with Enterococcus facialis. [name] with mitral valve vegetation. -ID following, appreciate recs -Ortho following, appreciate recs -needs 6 weeks IV antibiotics for endocarditis, PICC line placed.  -Scheduled Tylenol, 5-10 mg Oxy as needed for pain control -continue ampicillin and ceftriaxone for possible native valve IE due to E faecalis per ID -plan for SNF but limited by placement of wound vac  Anemia following surgery Stable. Thus far received 3 units pRBC this hospitalization, anemia likely in the setting of acute blood loss post surgically and due to history of anemia of chronic disease at baseline. Hgb 7.8, stable from yesterday.  -holding DVT prophylaxis, continue SCDs -Monitor Hgb and clinically for bleeding, transfusion threshold 7.   Constipation S/P Ortho surgery.  -Colace scheduled.  -Dulcolax and senna prn   Pressure ulcer Chronic stage IV pressure  ulcer on left buttock, stage III and stage II ulcers present in sacral region.  Additional wound on dorsum of right foot noted.  -Bedside nursing performing wound care  -surgery to see this am to ensure wound vac appropriate   Prolonged Q-T interval on ECG Stable. Had prolonged Q-T interval on ECG on admission and flecainide was held due to it QT prolonging potential. However, patient with PVCs. Cardiology curbsided, recommended continuing monitor QTc on telemetry and restarting flecainide.  -continue flecainide  -continue cardiac telemetry to monitor       FEN/GI: regular PPx: SCDs Dispo:SNF pending clinical improvement . Barriers include wound vac.   Subjective:  No acute overnight events reported. Patient denies any concerns. Went to check on patient and nursing appeared at bedside during my visit.   Objective: Temp:  [98.8 F (37.1 C)-99.4 F (37.4 C)] 99 F (37.2 C) (09/03 0500) Pulse Rate:  [84-88] 84 (09/03 0153) Resp:  [14-19] 14 (09/03 0416) BP: (108-126)/(58-78) 108/64 (09/03 0416) SpO2:  [98 %-100 %] 98 % (09/03 0153) Physical Exam: General: Patient laying comfortably in bed, in no acute distress. Cardiovascular: RRR, no murmurs or gallops auscultated Respiratory: CTAB, no wheezing, rales or rhonchi noted Abdomen: spft, nontender, nondistended, presence of bowel sounds Extremities: no LE edema noted bilaterally, distal pulses strong and equal bilaterally GU: very minimal drainage of serosanguinous fluid from buttocks wound, no drainage or bleeding from wound vac  Psych: mood appropriate, very pleasant   GU exam performed in the presence of chaperone, Luna Glasgow, RN.   Laboratory: Most recent CBC Lab Results  Component Value Date   WBC 8.6 09/22/2021   HGB 7.8 (L) 09/22/2021  HCT 25.7 (L) 09/22/2021   MCV 93.8 09/22/2021   PLT 372 09/22/2021   Most recent BMP    Latest Ref Rng & Units 09/18/2021    1:43 AM  BMP  Glucose 70 - 99 mg/dL 283   BUN 6 -  20 mg/dL 7   Creatinine 1.51 - 7.61 mg/dL 6.07   Sodium 371 - 062 mmol/L 137   Potassium 3.5 - 5.1 mmol/L 3.8   Chloride 98 - 111 mmol/L 105   CO2 22 - 32 mmol/L 26   Calcium 8.9 - 10.3 mg/dL 7.9      Imaging/Diagnostic Tests: No results found.   Reece Leader, DO 09/22/2021, 6:38 AM  PGY-3, [place] [facility] FPTS Intern pager: [phone], text pages welcome Secure chat group [facility] Teaching Service

## 2021-09-23 DIAGNOSIS — M009 Pyogenic arthritis, unspecified: Secondary | ICD-10-CM | POA: Diagnosis not present

## 2021-09-23 LAB — CBC
HCT: 26.3 % — ABNORMAL LOW (ref 39.0–52.0)
Hemoglobin: 7.9 g/dL — ABNORMAL LOW (ref 13.0–17.0)
MCH: 28.3 pg (ref 26.0–34.0)
MCHC: 30 g/dL (ref 30.0–36.0)
MCV: 94.3 fL (ref 80.0–100.0)
Platelets: 351 10*3/uL (ref 150–400)
RBC: 2.79 MIL/uL — ABNORMAL LOW (ref 4.22–5.81)
RDW: 19.3 % — ABNORMAL HIGH (ref 11.5–15.5)
WBC: 6.8 10*3/uL (ref 4.0–10.5)
nRBC: 0 % (ref 0.0–0.2)

## 2021-09-23 MED ORDER — FLUTICASONE PROPIONATE 50 MCG/ACT NA SUSP
2.0000 | Freq: Every day | NASAL | Status: DC
  Administered 2021-09-23 – 2021-09-24 (×2): 2 via NASAL
  Filled 2021-09-23: qty 16

## 2021-09-23 MED ORDER — SALINE SPRAY 0.65 % NA SOLN
1.0000 | NASAL | Status: DC | PRN
  Administered 2021-09-23: 1 via NASAL
  Filled 2021-09-23: qty 44

## 2021-09-23 MED ORDER — LORATADINE 10 MG PO TABS
10.0000 mg | ORAL_TABLET | Freq: Every day | ORAL | Status: DC
  Administered 2021-09-24: 10 mg via ORAL
  Filled 2021-09-23: qty 1

## 2021-09-23 NOTE — TOC Progression Note (Signed)
Transition of Care [facility]) - Progression Note    Patient Details  Name: DAWAUN BRANCATO MRN: [id number] Date of Birth: 02/20/1970  Transition of Care [facility]) CM/SW Contact  Carley Hammed, [place] Phone Number: 09/23/2021, 12:23 PM  Clinical Narrative:     [name] followed up with Kia at [facility] to confirm DC plans. Kia noted wound vac had been ordered today and should be delivered tomorrow. Pt can d/c after vac received. TOC will continue to follow for DC needs.  Expected Discharge Plan: Skilled Nursing Facility Barriers to Discharge: SNF Pending bed offer, Continued Medical Work up  Expected Discharge Plan and Services Expected Discharge Plan: Skilled Nursing Facility   Discharge Planning Services: CM Consult Post Acute Care Choice: Home Health Living arrangements for the past 2 months: Apartment                 DME Arranged: Government social research officer DME Agency: [employer] Date DME Agency Contacted: 09/05/21 Time DME Agency Contacted: 1410 Representative spoke with at DME Agency: Arnold Long HH Arranged: PT, RN HH Agency: [facility] Date [facility] Agency Contacted: 09/05/21 Time HH Agency Contacted: 1300 Representative spoke with at [facility] Agency: Candise Bowens   Social Determinants of Health (SDOH) Interventions    Readmission Risk Interventions     No data to display

## 2021-09-23 NOTE — Progress Notes (Signed)
Patient ID: Anthony [name], male   DOB: 1970/11/07, 51 y.o.   MRN: [id number] Patient is status post [place] hip disarticulation for chronic osteomyelitis left hip.  Cultures are showing Enterococcus faecalis.  The wound VAC has no drainage and a good suction fit.  Anticipate patient can be discharged on antibiotics and a portable [name] wound VAC pump.

## 2021-09-23 NOTE — Progress Notes (Addendum)
Daily Progress Note Intern Pager: [phone]  Patient name: Anthony [name] Medical record number: [id number] Date of birth: March 26, 1970 Age: 51 y.o. Gender: male  Primary Care Provider: Erick Alley, DO Consultants: Ortho Code Status: Full  Pt Overview and Major Events to Date:  8/15: Admitted for sepsis 8/16: Septic shock, transfered to ICU. Started on pressors 8/18: Off pressors, transferred back to FMTS 8/22: Left hip debridement and removal of left hip femoral head 8/25: Planned left hip debridement 8/26: 2 U pRBCs transfused 8/30: repeat debridement 8/31: 1 U pRBCs transfused  Assessment and Plan: Anthony [name] is a 51 y.o. male with PMHx of paraplegia from spina bifida who was admitted for septic L hip arthritis, complicated by septic shock warranting ICU visit, now back on floor and s/p surgical debridement and repeat debridement with no further interventions planned.  * Septic arthritis of hip (HCC) Stable. S/p removal of left femoral head 8/22 with repeat wound debridement and wound cultures with Enterococcus facialis. [name] with mitral valve vegetation.  -ID following, appreciate recs -Ortho following, appreciate recs -needs 6 weeks IV antibiotics for endocarditis, PICC line placed.   -continue ampicillin and ceftriaxone for possible native valve IE due to E faecalis per ID -Scheduled Tylenol, 5-10 mg Oxy as needed for pain control -plan for SNF but limited by placement of wound vac  Anemia following surgery Stable low, likely also a component of anemia of chronic dz. -holding DVT prophylaxis, continue SCDs -Monitor Hgb and clinically for bleeding, transfusion threshold 7.   Constipation Having BM's. -Colace scheduled.  -Dulcolax and senna prn   Pressure ulcer Chronic stage IV pressure ulcer on left buttock, stage III and stage II ulcers present in sacral region.  Additional wound on dorsum of right foot noted.  -Bedside nursing performing wound care   -surgery following  Prolonged Q-T interval on ECG Stable. Had prolonged Q-T interval on ECG on admission and flecainide was held due to it QT prolonging potential. However, patient with PVCs. Cardiology curbsided, recommended continuing monitor QTc on telemetry and restarting flecainide.  -continue flecainide  -continue cardiac telemetry to monitor    FEN/GI: Regular diet PPx: SCDs Dispo:SNF tomorrow. Barriers include Need for wound vac at SNF.   Subjective:  NAEO. Pt denies pain or discomfort this morning. Had a BM yesterday.  Objective: Temp:  [98 F (36.7 C)-99.3 F (37.4 C)] 98.8 F (37.1 C) (09/04 0802) Pulse Rate:  [78-93] 81 (09/04 0802) Resp:  [11-19] 16 (09/04 0802) BP: (102-122)/(60-70) 102/60 (09/04 0802) SpO2:  [97 %-98 %] 98 % (09/04 0802) Physical Exam: General: Friendly, alert man sitting up in bed. NAD Cardiovascular: RRR, no murmurs. Respiratory: CTAB. Normal WOB on RA. Abdomen: Soft, nontender, nondistended. Normal BS. Msk: Wound vac in place on L lateral flank.  Laboratory: Most recent CBC Lab Results  Component Value Date   WBC 6.8 09/23/2021   HGB 7.9 (L) 09/23/2021   HCT 26.3 (L) 09/23/2021   MCV 94.3 09/23/2021   PLT 351 09/23/2021   Most recent BMP    Latest Ref Rng & Units 09/18/2021    1:43 AM  BMP  Glucose 70 - 99 mg/dL 841   BUN 6 - 20 mg/dL 7   Creatinine 6.60 - 6.30 mg/dL 1.60   Sodium 109 - 323 mmol/L 137   Potassium 3.5 - 5.1 mmol/L 3.8   Chloride 98 - 111 mmol/L 105   CO2 22 - 32 mmol/L 26   Calcium 8.9 - 10.3 mg/dL  7.9     Other pertinent labs None   Imaging/Diagnostic Tests: None  Lincoln Brigham, MD 09/23/2021, 2:07 PM  PGY-1, [facility] FPTS Intern pager: [phone], text pages welcome Secure chat group [facility] Teaching Service

## 2021-09-23 NOTE — Progress Notes (Addendum)
Physical Therapy Treatment Patient Details Name: Anthony [name] MRN: [id number] DOB: 1970-12-24 Today's Date: 09/23/2021   History of Present Illness Pt is a 51 y/o male admitted secondary to sepsis likely from chronic wounds. Pt with necrotic L femur and acetabulum.On 8/24, pt underwent Girdlestone amputation left hip with partial excision of the acetabulum, I&D with multiple organisms noted. Further I&D on 8/30 to L hip. PMH includes  spina bifida and paraplegia, neurogenic bladder with suprapubic catheter, complicated by chronic sacral wounds, chronic hip wound.    PT Comments    Pt maintaining level of functional mobility and agreeable to participate in PT session. Pt modI for bed mobility and performing lateral scoot transfers with and without slide board with minimal assist. Pt propelling w/c hallway distances with bilateral upper extremities. Reports improved pain control and sitting tolerance. Continue to recommend SNF for ongoing Physical Therapy.      Recommendations for follow up therapy are one component of a multi-disciplinary discharge planning process, led by the attending physician.  Recommendations may be updated based on patient status, additional functional criteria and insurance authorization.  Follow Up Recommendations  Skilled nursing-short term rehab (<3 hours/day) Can patient physically be transported by private vehicle: No   Assistance Recommended at Discharge Intermittent Supervision/Assistance  Patient can return home with the following A little help with walking and/or transfers;A little help with bathing/dressing/bathroom;Assistance with cooking/housework;Assist for transportation;Help with stairs or ramp for entrance   Equipment Recommendations  Other (comment) (defer to SNF)    Recommendations for Other Services       Precautions / Restrictions Precautions Precautions: Fall Precaution Comments: wound vac, hx of paraplegia, suprapubic catheter,  PICC Restrictions Weight Bearing Restrictions: No     Mobility  Bed Mobility Overal bed mobility: Needs Assistance Bed Mobility: Supine to Sit, Sit to Supine, Rolling Rolling: Modified independent (Device/Increase time)   Supine to sit: Modified independent (Device/Increase time) Sit to supine: Modified independent (Device/Increase time)        Transfers Overall transfer level: Needs assistance Equipment used: None, Sliding board Transfers: Bed to chair/wheelchair/BSC            Lateral/Scoot Transfers: Min assist General transfer comment: Min A to/from personal w/c with (+2 for safety to block w/c due to brakes not working properly and for [employer]). cues for monitoring LE positioning during transfer. Pt performed low pivot from bed > w/c and then slide board transfer from w/c > bed    Ambulation/Gait               General Gait Details: N/A   Psychologist, counselling mobility: Yes Wheelchair propulsion: Both upper extremities Wheelchair parts: Independent Distance: 100  Modified Rankin (Stroke Patients Only)       Balance Overall balance assessment: Needs assistance Sitting-balance support: No upper extremity supported, Feet unsupported Sitting balance-Leahy Scale: Fair                                      Cognition Arousal/Alertness: Awake/alert Behavior During Therapy: WFL for tasks assessed/performed Overall Cognitive Status: Within Functional Limits for tasks assessed  Exercises      General Comments        Pertinent Vitals/Pain Pain Assessment Pain Assessment: Faces Faces Pain Scale: Hurts a little bit Pain Location: L hip Pain Descriptors / Indicators: Sore Pain Intervention(s): Monitored during session    Home Living                          Prior Function            PT Goals (current  goals can now be found in the care plan section) Acute Rehab PT Goals Potential to Achieve Goals: Good Progress towards PT goals: Progressing toward goals    Frequency    Min 2X/week      PT Plan Frequency needs to be updated    Co-evaluation              AM-PAC PT "6 Clicks" Mobility   Outcome Measure  Help needed turning from your back to your side while in a flat bed without using bedrails?: None Help needed moving from lying on your back to sitting on the side of a flat bed without using bedrails?: None Help needed moving to and from a bed to a chair (including a wheelchair)?: A Little Help needed standing up from a chair using your arms (e.g., wheelchair or bedside chair)?: Total Help needed to walk in hospital room?: Total Help needed climbing 3-5 steps with a railing? : Total 6 Click Score: 14    End of Session   Activity Tolerance: Patient tolerated treatment well Patient left: in bed;with call bell/phone within reach Nurse Communication: Mobility status PT Visit Diagnosis: Muscle weakness (generalized) (M62.81);Other abnormalities of gait and mobility (R26.89);Pain Pain - Right/Left: Left Pain - part of body: Hip     Time: 4259-5638 PT Time Calculation (min) (ACUTE ONLY): 44 min  Charges:  $Therapeutic Activity: 38-52 mins                     Lillia Pauls, PT, DPT Acute Rehabilitation Services Office [phone]    MATTIAS WALMSLEY 09/23/2021, 4:16 PM

## 2021-09-24 DIAGNOSIS — I10 Essential (primary) hypertension: Secondary | ICD-10-CM | POA: Diagnosis not present

## 2021-09-24 DIAGNOSIS — R460 Very low level of personal hygiene: Secondary | ICD-10-CM | POA: Diagnosis not present

## 2021-09-24 DIAGNOSIS — F29 Unspecified psychosis not due to a substance or known physiological condition: Secondary | ICD-10-CM | POA: Diagnosis not present

## 2021-09-24 DIAGNOSIS — Z8616 Personal history of COVID-19: Secondary | ICD-10-CM | POA: Diagnosis not present

## 2021-09-24 DIAGNOSIS — G4700 Insomnia, unspecified: Secondary | ICD-10-CM | POA: Diagnosis not present

## 2021-09-24 DIAGNOSIS — R0981 Nasal congestion: Secondary | ICD-10-CM | POA: Diagnosis not present

## 2021-09-24 DIAGNOSIS — Z9359 Other cystostomy status: Secondary | ICD-10-CM | POA: Diagnosis not present

## 2021-09-24 DIAGNOSIS — D649 Anemia, unspecified: Secondary | ICD-10-CM | POA: Diagnosis not present

## 2021-09-24 DIAGNOSIS — N179 Acute kidney failure, unspecified: Secondary | ICD-10-CM | POA: Diagnosis not present

## 2021-09-24 DIAGNOSIS — R748 Abnormal levels of other serum enzymes: Secondary | ICD-10-CM | POA: Diagnosis not present

## 2021-09-24 DIAGNOSIS — M009 Pyogenic arthritis, unspecified: Secondary | ICD-10-CM | POA: Diagnosis not present

## 2021-09-24 DIAGNOSIS — L89310 Pressure ulcer of right buttock, unstageable: Secondary | ICD-10-CM | POA: Diagnosis not present

## 2021-09-24 DIAGNOSIS — Q059 Spina bifida, unspecified: Secondary | ICD-10-CM | POA: Diagnosis not present

## 2021-09-24 DIAGNOSIS — G8220 Paraplegia, unspecified: Secondary | ICD-10-CM | POA: Diagnosis not present

## 2021-09-24 DIAGNOSIS — Z4889 Encounter for other specified surgical aftercare: Secondary | ICD-10-CM | POA: Diagnosis not present

## 2021-09-24 DIAGNOSIS — L089 Local infection of the skin and subcutaneous tissue, unspecified: Secondary | ICD-10-CM | POA: Diagnosis not present

## 2021-09-24 DIAGNOSIS — M6281 Muscle weakness (generalized): Secondary | ICD-10-CM | POA: Diagnosis not present

## 2021-09-24 DIAGNOSIS — G8929 Other chronic pain: Secondary | ICD-10-CM | POA: Diagnosis not present

## 2021-09-24 DIAGNOSIS — L89890 Pressure ulcer of other site, unstageable: Secondary | ICD-10-CM | POA: Diagnosis not present

## 2021-09-24 DIAGNOSIS — B952 Enterococcus as the cause of diseases classified elsewhere: Secondary | ICD-10-CM | POA: Diagnosis not present

## 2021-09-24 DIAGNOSIS — R7881 Bacteremia: Secondary | ICD-10-CM | POA: Diagnosis not present

## 2021-09-24 DIAGNOSIS — R197 Diarrhea, unspecified: Secondary | ICD-10-CM | POA: Diagnosis not present

## 2021-09-24 DIAGNOSIS — M00852 Arthritis due to other bacteria, left hip: Secondary | ICD-10-CM | POA: Diagnosis not present

## 2021-09-24 DIAGNOSIS — I493 Ventricular premature depolarization: Secondary | ICD-10-CM | POA: Diagnosis not present

## 2021-09-24 DIAGNOSIS — M62838 Other muscle spasm: Secondary | ICD-10-CM | POA: Diagnosis not present

## 2021-09-24 DIAGNOSIS — Z1621 Resistance to vancomycin: Secondary | ICD-10-CM | POA: Diagnosis not present

## 2021-09-24 DIAGNOSIS — Z87891 Personal history of nicotine dependence: Secondary | ICD-10-CM | POA: Diagnosis not present

## 2021-09-24 DIAGNOSIS — Z978 Presence of other specified devices: Secondary | ICD-10-CM | POA: Diagnosis not present

## 2021-09-24 DIAGNOSIS — Q549 Hypospadias, unspecified: Secondary | ICD-10-CM | POA: Diagnosis not present

## 2021-09-24 DIAGNOSIS — L03115 Cellulitis of right lower limb: Secondary | ICD-10-CM | POA: Diagnosis not present

## 2021-09-24 DIAGNOSIS — K5900 Constipation, unspecified: Secondary | ICD-10-CM | POA: Diagnosis not present

## 2021-09-24 DIAGNOSIS — N319 Neuromuscular dysfunction of bladder, unspecified: Secondary | ICD-10-CM | POA: Diagnosis not present

## 2021-09-24 DIAGNOSIS — R102 Pelvic and perineal pain: Secondary | ICD-10-CM | POA: Diagnosis not present

## 2021-09-24 DIAGNOSIS — R5383 Other fatigue: Secondary | ICD-10-CM | POA: Diagnosis not present

## 2021-09-24 DIAGNOSIS — Z993 Dependence on wheelchair: Secondary | ICD-10-CM | POA: Diagnosis not present

## 2021-09-24 DIAGNOSIS — R0789 Other chest pain: Secondary | ICD-10-CM | POA: Diagnosis not present

## 2021-09-24 DIAGNOSIS — M25552 Pain in left hip: Secondary | ICD-10-CM | POA: Diagnosis not present

## 2021-09-24 DIAGNOSIS — L89324 Pressure ulcer of left buttock, stage 4: Secondary | ICD-10-CM | POA: Diagnosis not present

## 2021-09-24 DIAGNOSIS — L89893 Pressure ulcer of other site, stage 3: Secondary | ICD-10-CM | POA: Diagnosis not present

## 2021-09-24 DIAGNOSIS — M86452 Chronic osteomyelitis with draining sinus, left femur: Secondary | ICD-10-CM | POA: Diagnosis not present

## 2021-09-24 DIAGNOSIS — E43 Unspecified severe protein-calorie malnutrition: Secondary | ICD-10-CM | POA: Diagnosis not present

## 2021-09-24 DIAGNOSIS — M71052 Abscess of bursa, left hip: Secondary | ICD-10-CM | POA: Diagnosis not present

## 2021-09-24 DIAGNOSIS — M533 Sacrococcygeal disorders, not elsewhere classified: Secondary | ICD-10-CM | POA: Diagnosis not present

## 2021-09-24 DIAGNOSIS — M25551 Pain in right hip: Secondary | ICD-10-CM | POA: Diagnosis not present

## 2021-09-24 DIAGNOSIS — L03116 Cellulitis of left lower limb: Secondary | ICD-10-CM | POA: Diagnosis not present

## 2021-09-24 DIAGNOSIS — N360 Urethral fistula: Secondary | ICD-10-CM | POA: Diagnosis not present

## 2021-09-24 DIAGNOSIS — L89894 Pressure ulcer of other site, stage 4: Secondary | ICD-10-CM | POA: Diagnosis not present

## 2021-09-24 DIAGNOSIS — R6521 Severe sepsis with septic shock: Secondary | ICD-10-CM | POA: Diagnosis not present

## 2021-09-24 DIAGNOSIS — J309 Allergic rhinitis, unspecified: Secondary | ICD-10-CM | POA: Diagnosis not present

## 2021-09-24 DIAGNOSIS — R509 Fever, unspecified: Secondary | ICD-10-CM | POA: Diagnosis not present

## 2021-09-24 DIAGNOSIS — I059 Rheumatic mitral valve disease, unspecified: Secondary | ICD-10-CM | POA: Diagnosis not present

## 2021-09-24 DIAGNOSIS — Z7401 Bed confinement status: Secondary | ICD-10-CM | POA: Diagnosis not present

## 2021-09-24 DIAGNOSIS — R42 Dizziness and giddiness: Secondary | ICD-10-CM | POA: Diagnosis not present

## 2021-09-24 DIAGNOSIS — Q057 Lumbar spina bifida without hydrocephalus: Secondary | ICD-10-CM | POA: Diagnosis not present

## 2021-09-24 DIAGNOSIS — R5381 Other malaise: Secondary | ICD-10-CM | POA: Diagnosis not present

## 2021-09-24 DIAGNOSIS — U071 COVID-19: Secondary | ICD-10-CM | POA: Diagnosis not present

## 2021-09-24 DIAGNOSIS — I872 Venous insufficiency (chronic) (peripheral): Secondary | ICD-10-CM | POA: Diagnosis not present

## 2021-09-24 DIAGNOSIS — M00851 Arthritis due to other bacteria, right hip: Secondary | ICD-10-CM | POA: Diagnosis not present

## 2021-09-24 DIAGNOSIS — M00052 Staphylococcal arthritis, left hip: Secondary | ICD-10-CM | POA: Diagnosis not present

## 2021-09-24 DIAGNOSIS — E785 Hyperlipidemia, unspecified: Secondary | ICD-10-CM | POA: Diagnosis not present

## 2021-09-24 DIAGNOSIS — L98418 Non-pressure chronic ulcer of buttock with other specified severity: Secondary | ICD-10-CM | POA: Diagnosis not present

## 2021-09-24 DIAGNOSIS — I330 Acute and subacute infective endocarditis: Secondary | ICD-10-CM | POA: Diagnosis not present

## 2021-09-24 DIAGNOSIS — Z792 Long term (current) use of antibiotics: Secondary | ICD-10-CM | POA: Diagnosis not present

## 2021-09-24 DIAGNOSIS — F419 Anxiety disorder, unspecified: Secondary | ICD-10-CM | POA: Diagnosis not present

## 2021-09-24 DIAGNOSIS — M8618 Other acute osteomyelitis, other site: Secondary | ICD-10-CM | POA: Diagnosis not present

## 2021-09-24 DIAGNOSIS — L89153 Pressure ulcer of sacral region, stage 3: Secondary | ICD-10-CM | POA: Diagnosis not present

## 2021-09-24 DIAGNOSIS — Z79899 Other long term (current) drug therapy: Secondary | ICD-10-CM | POA: Diagnosis not present

## 2021-09-24 DIAGNOSIS — M162 Bilateral osteoarthritis resulting from hip dysplasia: Secondary | ICD-10-CM | POA: Diagnosis not present

## 2021-09-24 MED ORDER — METRONIDAZOLE 500 MG PO TABS: 500.0000 mg | ORAL_TABLET | Freq: Two times a day (BID) | ORAL | 0 refills | Status: DC

## 2021-09-24 MED ORDER — DOCUSATE SODIUM 100 MG PO CAPS: 100.0000 mg | ORAL_CAPSULE | Freq: Two times a day (BID) | ORAL | 0 refills | Status: DC

## 2021-09-24 MED ORDER — AMPICILLIN IV (FOR PTA / DISCHARGE USE ONLY): 12.0000 g | INTRAVENOUS | 0 refills | Status: DC

## 2021-09-24 MED ORDER — CHLORHEXIDINE GLUCONATE CLOTH 2 % EX PADS: 6.0000 | MEDICATED_PAD | Freq: Every day | CUTANEOUS | 2 refills | Status: DC

## 2021-09-24 MED ORDER — OXYCODONE HCL 5 MG PO TABS
5.0000 mg | ORAL_TABLET | ORAL | 0 refills | Status: DC | PRN
Start: 2021-09-24 — End: 2022-01-10

## 2021-09-24 MED ORDER — ACETAMINOPHEN 500 MG PO TABS: 1000.0000 mg | ORAL_TABLET | Freq: Two times a day (BID) | ORAL | 0 refills | Status: DC | PRN

## 2021-09-24 MED ORDER — IBUPROFEN 600 MG PO TABS: 600.0000 mg | ORAL_TABLET | Freq: Four times a day (QID) | ORAL | 0 refills | Status: DC | PRN

## 2021-09-24 MED ORDER — CEFTRIAXONE IV (FOR PTA / DISCHARGE USE ONLY): 2.0000 g | Freq: Two times a day (BID) | INTRAVENOUS | 0 refills | Status: DC

## 2021-09-24 NOTE — Discharge Summary (Addendum)
[facility] Discharge Summary  Patient name: Anthony [name] Medical record number: [id number] Date of birth: [birth year] Age: 51 y.o. Gender: male Date of Admission: 09/03/2021  Date of Discharge: 09/24/21 Admitting Physician: Sharion Settler, DO  Primary Care Provider: Precious Gilding, DO Consultants: Orthopedic  Indication for Hospitalization: Septic osteomyelitis requiring surgical debridement and infectious endocarditis requiring IV antibiotics  Brief Hospital Course:  51 year old male with paraplegia secondary to spina bifida admitted for septic shock secondary to L hip septic osteomyelitis, now s/p debridement x3, stay also complicated by mitral valve endocarditis with E. faecalis. PMHx includes hypospadia/urethro-cutaneous fistula, neurogenic bladder with chronic suprapubic catheter, bilateral hip dysplasia, hx ischiopubic abscess 2019 s/p debridement, chronic draining left hip sinus tract. His hospital course is outlined below:   Left hip acetabulum osteomyelitis with shock Presented to the ED with intermittent fevers to 102F and chills for the past 1.5 weeks and worsening malodorous drainage with blood from his left sacral decubitus ulcer. Admitted to ICU for hypotension and septic shock and started on low-dose peripheral vasopressors. Transferred to FMTS service for continued management.  MRI of the hip showed soft tissue wound overlying the left ischial tuberosity with severe cellulitis, 6.8 x 4.3 x 8.3 cm abscess, second 2.6 x 8.4 x 2.8 cm abscess, mild marrow edema in the posterior left acetabulum concerning for osteomyelitis. BoneCx grew E. Faecalis and Bacteroides. Admission Bcx grew S. Anginosus. Orthopedic surgery was consulted and recommended Girdlestone amputation of the proximal femur and debridement of the large abscesses - he is now s/p debridement x3 with no further plans for repeat of the procedure and source control achieved. Repeat Bcx negative.  Seen by PT/OT who recommended SNF placement. Still on antibiotics for MV endocarditis s/p PICC line (see below)  Mitral Valve Endocarditis [name] was performed and saw a vegetation on the mitral valve leaflet. Difficult to discern if organism is E. faecalis vs. S. Anginosus but thought to be due to E. Faecalis given it is more typical for endocarditis. He was started on antibiotics to treat for endocarditis with synergy. Per ID,  PICC line placed 8/31 for IV antibiotics and will be discharged on:  Ampicillin 12g/day as a continuous infusion Rocephin 2g IV every 12 hours  Metronidazole 500 mg po every 12 hours   Duration: 6 weeks End Date: 10/30/21  Pressure Ulcer Chronic, stage IV pressure ulcer on left buttock, stage III and stage II ulcers present in sacral region.  Additional wound on dorsum of right foot noted. Seen by wound care on admission, and nursing performed wound care throughout stay.  Prolonged QT Interval of ECG On flecainide for PVCs per cardiology. Held flecainide on admission and restarted due to PVCs. Monitored on tele and repeated EKGs during admission. Was medically stable at discharge.   Anemia following surgery Likely acute blood loss from surgery and potential anemia of chronic disease. Hemoglobin dipped to nadir of 6.7, given PRBC transfusions x3units. Goal throughout stay was > 7. Prior to discharge, hgb stably low at 7.8 and normocytic.   Discharge Diagnoses/Problem List:  Present on Admission:  Pressure ulcer L hip osteomyelitis  Infectious endocarditis    Disposition: SNF  Discharge Condition: Improved  Discharge Exam:  Gen: Alert, comfortable appearing man laying in bed. NAD. HEENT: NCAT. MMM CV: RRR, no murmurs.  Pulm: CTAB. Normal WOB on RA. Abm: Soft, nontender, nondistended. Normal BS. Wound vac in place over L lateral hip. Ext: No edema in BL LE.  Issues for Follow Up:  Follow-up completion of 6wk antibiotic course for infectious  endocarditis Repeat CBC in 3 days to monitor hemoglobin   Significant Procedures:  8/22 L hip debridement and removal of l hip femoral head  Significant Labs and Imaging:  Recent Labs  Lab 09/23/21 0520  WBC 6.8  HGB 7.9*  HCT 26.3*  PLT 351    Results/Tests Pending at Time of Discharge: None  Discharge Medications:  Allergies as of 09/24/2021       Reactions   Vancomycin Itching        Medication List     STOP taking these medications    lisinopril-hydrochlorothiazide 20-25 MG tablet Commonly known as: ZESTORETIC   metoprolol succinate 50 MG 24 hr tablet Commonly known as: TOPROL-XL   Potassium 99 MG Tabs       TAKE these medications    acetaminophen 500 MG tablet Commonly known as: TYLENOL Take 2 tablets (1,000 mg total) by mouth 2 (two) times daily as needed for mild pain, headache or fever.   ampicillin  IVPB Inject 12 g into the vein daily. Indication:  Strep/Enterococcal MV IE, hip osteo First Dose: Yes Last Day of Therapy:  10/30/21 Labs - Once weekly:  CBC/D and BMP, Labs - Every other week:  ESR and CRP Method of administration: Ambulatory Pump (Continuous Infusion) - give 12g/day as a continuous infusion Method of administration may be changed at the discretion of home infusion pharmacist based upon assessment of the patient and/or caregiver's ability to self-administer the medication ordered.   cefTRIAXone  IVPB Commonly known as: ROCEPHIN Inject 2 g into the vein every 12 (twelve) hours. Indication:  Strep/Enterococcal MV IE, hip osteo First Dose: Yes Last Day of Therapy:  10/30/21 Labs - Once weekly:  CBC/D and BMP, Labs - Every other week:  ESR and CRP Method of administration: IV Push Method of administration may be changed at the discretion of home infusion pharmacist based upon assessment of the patient and/or caregiver's ability to self-administer the medication ordered.   Chlorhexidine Gluconate Cloth 2 % Pads Apply 6 each  topically daily.   docusate sodium 100 MG capsule Commonly known as: COLACE Take 1 capsule (100 mg total) by mouth 2 (two) times daily.   Fish Oil 1000 MG Caps Take 1 capsule by mouth 2 (two) times daily.   flecainide 50 MG tablet Commonly known as: TAMBOCOR Take 1 tablet (50 mg total) by mouth 2 (two) times daily.   Foley Catheter 2-Way Misc Use as directed by urologist.   ibuprofen 600 MG tablet Commonly known as: ADVIL Take 1 tablet (600 mg total) by mouth every 6 (six) hours as needed for fever.   metroNIDAZOLE 500 MG tablet Commonly known as: Flagyl Take 1 tablet (500 mg total) by mouth 2 (two) times daily. Stop date 10/30/21   multivitamin with minerals Tabs tablet Take 1 tablet by mouth daily.   oxyCODONE 5 MG immediate release tablet Commonly known as: Oxy IR/ROXICODONE Take 1 tablet (5 mg total) by mouth every 4 (four) hours as needed for moderate pain (Pain score 4-6).   rosuvastatin 40 MG tablet Commonly known as: CRESTOR Take 1 tablet (40 mg total) by mouth daily.               Durable Medical Equipment  (From admission, onward)           Start     Ordered   09/05/21 1651  For home use only DME high strength lightweight manual wheelchair with seat  cushion  Once       Comments: Patient suffers from [facility] and has paraplegia which impairs mobility and his ability to perform activities of daily living in the home without a specialized wheelchair.   An assistive device will not resolve issue with performing activities of daily living as he is wheelchair bound. A wheelchair will allow patient to safely perform daily activities.  Length of need Lifetime. Patient self-propels the wheelchair while engaging in frequent activities in the community which cannot be performed in a standard or lightweight wheelchair due to the weight of the chair.  Pt wishes to have input on the chair's features and cushion.  He requires both high strength and lightweight among  other features.   09/05/21 1702   09/05/21 1520  For home use only DME Tub bench  Once        09/05/21 1519   09/05/21 1519  For home use only DME lightweight manual wheelchair with seat cushion  Once       Comments: Patient suffers from Partial Paraplegia which impairs their ability to perform daily activities like bathing, dressing, grooming, and toileting in the home.  A walker will not resolve  issue with performing activities of daily living. A wheelchair will allow patient to safely perform daily activities. Patient is not able to propel themselves in the home using a standard weight wheelchair due to general weakness. Patient can self propel in the lightweight wheelchair. Length of need 12 months . Accessories: elevating leg rests (ELRs), wheel locks, extensions and anti-tippers.   09/05/21 1519              Discharge Care Instructions  (From admission, onward)           Start     Ordered   09/24/21 0000  Change dressing on IV access line weekly and PRN  (Home infusion instructions - [facility] )        09/24/21 1233   09/24/21 0000  Discharge wound care:       Comments: Per wound care instructions   09/24/21 1233            Discharge Instructions: Please refer to Patient Instructions section of EMR for full details.  Patient was counseled important signs and symptoms that should prompt return to medical care, changes in medications, dietary instructions, activity restrictions, and follow up appointments.   Follow-Up Appointments:  Contact information for follow-up providers     [facility] Follow up.   Specialty: Home Health Services Why: Someone will call you to schedule first home visit. Contact information: [place] [phone]         Newt Minion, MD Follow up in 1 week(s).   Specialty: Orthopedic Surgery Contact information: [place]  [phone]              Contact information for after-discharge care     Destination     [facility] Preferred SNF .   Service: Skilled Nursing Contact information: [address] [phone]                     Arlyce Dice, MD 09/24/2021, 12:36 PM PGY-1, [facility] Upper-Level Resident Addendum   I have independently interviewed and examined the patient. I have discussed the above with the original author and  agree with their documentation. My edits for correction/addition/clarification are in within the document. Please see also any attending notes.   Rise Patience, DO  PGY-3, [place] [facility] 09/24/2021 1:12 PM  FPTS Service pager: [phone] (text pages welcome through [facility])

## 2021-09-24 NOTE — TOC Transition Note (Signed)
Transition of Care [facility]) - CM/SW Discharge Note   Patient Details  Name: ALPHONSA BRICKLE MRN: [id number] Date of Birth: 1970-10-08  Transition of [facility]) CM/SW Contact:  Carley Hammed, LCSWA Phone Number: 09/24/2021, 1:02 PM   Clinical Narrative:    Pt to be transported to [facility] via [address]. Nurse to call report to [phone]   Final next level of care: Skilled Nursing Facility Barriers to Discharge: Barriers Resolved   Patient Goals and CMS Choice Patient states their goals for this hospitalization and ongoing recovery are:: agreeable to SNF CMS Medicare.gov Compare Post Acute Care list provided to:: Patient Choice offered to / list presented to : Patient  Discharge Placement              Patient chooses bed at: [facility] Patient to be transferred to facility by: [facility] Name of family member notified: Patient Patient and family notified of of transfer: 09/24/21  Discharge Plan and Services   Discharge Planning Services: CM Consult Post Acute Care Choice: Home Health          DME Arranged: Wheelchair manual DME Agency: [employer] Date DME Agency Contacted: 09/05/21 Time DME Agency Contacted: 1410 Representative spoke with at DME Agency: Arnold Long HH Arranged: PT, RN HH Agency: [facility] Date [facility] Agency Contacted: 09/05/21 Time HH Agency Contacted: 1300 Representative spoke with at [facility] Agency: Candise Bowens  Social Determinants of Health (SDOH) Interventions     Readmission Risk Interventions     No data to display

## 2021-09-24 NOTE — Progress Notes (Signed)
Occupational Therapy Treatment Patient Details Name: Anthony [name] MRN: [id number] DOB: Jun 26, 1970 Today's Date: 09/24/2021   History of present illness Pt is a 51 y/o male admitted secondary to sepsis likely from chronic wounds. Pt with necrotic L femur and acetabulum.On 8/24, pt underwent [place] amputation left hip with partial excision of the acetabulum, I&D with multiple organisms noted. Further I&D on 8/30 to L hip. PMH includes  spina bifida and paraplegia, neurogenic bladder with suprapubic catheter, complicated by chronic sacral wounds, chronic hip wound.   OT comments  Pt progressing towards acute OT goals. Focus of session was functional transfers and household distance mobility at w/c level. Pt remains in dire need of new w/c as current one's brakes do not work; assist given to manually stabilize w/c during lateral transfers. Pt tolerated session well. D/c recommendation remains appropriate.    Recommendations for follow up therapy are one component of a multi-disciplinary discharge planning process, led by the attending physician.  Recommendations may be updated based on patient status, additional functional criteria and insurance authorization.    Follow Up Recommendations  Skilled nursing-short term rehab (<3 hours/day)    Assistance Recommended at Discharge Intermittent Supervision/Assistance  Patient can return home with the following  A little help with walking and/or transfers;A little help with bathing/dressing/bathroom;Assist for transportation   Equipment Recommendations  Wheelchair (measurements OT);Wheelchair cushion (measurements OT)    Recommendations for Other Services      Precautions / Restrictions Precautions Precautions: Fall Precaution Comments: wound vac, hx of paraplegia, suprapubic catheter, PICC Restrictions Weight Bearing Restrictions: No RLE Weight Bearing: Non weight bearing LLE Weight Bearing: Non weight bearing Other Position/Activity  Restrictions: paraplegic       Mobility Bed Mobility Overal bed mobility: Needs Assistance Bed Mobility: Supine to Sit Rolling: Modified independent (Device/Increase time)              Transfers Overall transfer level: Needs assistance                       Balance Overall balance assessment: Needs assistance Sitting-balance support: No upper extremity supported, Feet unsupported Sitting balance-Leahy Scale: Fair Sitting balance - Comments: Reliant on UE support                                   ADL either performed or assessed with clinical judgement   ADL Overall ADL's : Needs assistance/impaired                         Toilet Transfer: Minimal assistance;Moderate assistance Toilet Transfer Details (indicate cue type and reason): assist to stabilize pt's w/c as brakes do not work. Extra time and effort, some setup assist. Lateral scoot EOB<>w/c           General ADL Comments: Transferred into pt's w/c then wheeled household distance in the hall.    Extremity/Trunk Assessment Upper Extremity Assessment Upper Extremity Assessment: Overall WFL for tasks assessed   Lower Extremity Assessment Lower Extremity Assessment: Defer to PT evaluation        Vision       Perception     Praxis      Cognition Arousal/Alertness: Awake/alert Behavior During Therapy: WFL for tasks assessed/performed Overall Cognitive Status: Within Functional Limits for tasks assessed  Exercises      Shoulder Instructions       General Comments The personal w/c pt used for transfer today is not safe for anyone to use and pt needs a new one.  Light weight sturdy, with firm tubed wheels, metal wheel grips, new cushion.    Pertinent Vitals/ Pain       Pain Assessment Pain Assessment: Faces Faces Pain Scale: Hurts a little bit Pain Location: L hip Pain Descriptors / Indicators:  Sore Pain Intervention(s): Monitored during session  Home Living                                          Prior Functioning/Environment              Frequency  Min 2X/week        Progress Toward Goals  OT Goals(current goals can now be found in the care plan section)  Progress towards OT goals: Progressing toward goals  Acute Rehab OT Goals Patient Stated Goal: get a new w/c OT Goal Formulation: With patient Time For Goal Achievement: 10/02/21 Potential to Achieve Goals: Good ADL Goals Pt Will Perform Lower Body Bathing: with modified independence;sitting/lateral leans Pt Will Perform Lower Body Dressing: with modified independence;sitting/lateral leans Pt Will Transfer to Toilet: with transfer board;with supervision Pt Will Perform Toileting - Clothing Manipulation and hygiene: with modified independence;sitting/lateral leans Additional ADL Goal #1: Pt will complete bed mobility with min guard for safety, as a precursor to seated ADLs  Plan Discharge plan remains appropriate    Co-evaluation                 AM-PAC OT "6 Clicks" Daily Activity     Outcome Measure   Help from another person eating meals?: None Help from another person taking care of personal grooming?: A Little Help from another person toileting, which includes using toliet, bedpan, or urinal?: A Little Help from another person bathing (including washing, rinsing, drying)?: A Little Help from another person to put on and taking off regular upper body clothing?: A Little Help from another person to put on and taking off regular lower body clothing?: A Little 6 Click Score: 19    End of Session Equipment Utilized During Treatment: Other (comment) (personal w/c)  OT Visit Diagnosis: Muscle weakness (generalized) (M62.81);Pain   Activity Tolerance Patient tolerated treatment well   Patient Left in bed;with call bell/phone within reach   Nurse Communication Other  (comment) (drainage from L hip, RN assessed during session)        Time: 1036-1130 OT Time Calculation (min): 54 min  Charges: OT General Charges $OT Visit: 1 Visit OT Treatments $Self Care/Home Management : 38-52 mins $Therapeutic Activity: 8-22 mins  Raynald Kemp, OT Acute Rehabilitation Services Office: [phone]   Pilar Grammes 09/24/2021, 12:27 PM

## 2021-09-25 ENCOUNTER — Telehealth: Payer: Self-pay

## 2021-09-25 NOTE — Telephone Encounter (Signed)
Anthony [name], wound care nurse at [facility] would like orders for wound vac.  Stated that wound vac for left hip needs to be switched to a facility wound vac.  Patient had left hip surgery on 09/18/2021.  Cb# [phone].  Please advise.  Thank you.

## 2021-09-26 ENCOUNTER — Ambulatory Visit: Payer: Medicare Other | Admitting: Cardiology

## 2021-09-26 DIAGNOSIS — R7881 Bacteremia: Secondary | ICD-10-CM | POA: Diagnosis not present

## 2021-09-26 DIAGNOSIS — Q057 Lumbar spina bifida without hydrocephalus: Secondary | ICD-10-CM | POA: Diagnosis not present

## 2021-09-26 DIAGNOSIS — N319 Neuromuscular dysfunction of bladder, unspecified: Secondary | ICD-10-CM | POA: Diagnosis not present

## 2021-09-26 DIAGNOSIS — G8220 Paraplegia, unspecified: Secondary | ICD-10-CM | POA: Diagnosis not present

## 2021-09-26 DIAGNOSIS — E785 Hyperlipidemia, unspecified: Secondary | ICD-10-CM | POA: Diagnosis not present

## 2021-09-26 DIAGNOSIS — L89890 Pressure ulcer of other site, unstageable: Secondary | ICD-10-CM | POA: Diagnosis not present

## 2021-09-26 DIAGNOSIS — I330 Acute and subacute infective endocarditis: Secondary | ICD-10-CM | POA: Diagnosis not present

## 2021-09-26 DIAGNOSIS — K5900 Constipation, unspecified: Secondary | ICD-10-CM | POA: Diagnosis not present

## 2021-09-26 DIAGNOSIS — M009 Pyogenic arthritis, unspecified: Secondary | ICD-10-CM | POA: Diagnosis not present

## 2021-09-26 DIAGNOSIS — L89310 Pressure ulcer of right buttock, unstageable: Secondary | ICD-10-CM | POA: Diagnosis not present

## 2021-09-26 DIAGNOSIS — L89324 Pressure ulcer of left buttock, stage 4: Secondary | ICD-10-CM | POA: Diagnosis not present

## 2021-09-26 DIAGNOSIS — M6281 Muscle weakness (generalized): Secondary | ICD-10-CM | POA: Diagnosis not present

## 2021-09-26 DIAGNOSIS — L89153 Pressure ulcer of sacral region, stage 3: Secondary | ICD-10-CM | POA: Diagnosis not present

## 2021-09-26 DIAGNOSIS — I059 Rheumatic mitral valve disease, unspecified: Secondary | ICD-10-CM | POA: Diagnosis not present

## 2021-09-26 DIAGNOSIS — J309 Allergic rhinitis, unspecified: Secondary | ICD-10-CM | POA: Diagnosis not present

## 2021-09-26 DIAGNOSIS — F419 Anxiety disorder, unspecified: Secondary | ICD-10-CM | POA: Diagnosis not present

## 2021-09-26 NOTE — Telephone Encounter (Signed)
Called and sw nurse. Pt has a post op appt 10/02/2021 pt has a prevena vac on and per the nurse has 6 lights on the vac. Advised as long as it does not have any complications this can remain in tact until his post op appt next week. If he does have a leak that they can not fix or a blockage then they can remove and apply a dry dressing change daily. This pt is s/p a hip debridement. Will call with any other questions.

## 2021-09-30 DIAGNOSIS — M6281 Muscle weakness (generalized): Secondary | ICD-10-CM | POA: Diagnosis not present

## 2021-09-30 DIAGNOSIS — M009 Pyogenic arthritis, unspecified: Secondary | ICD-10-CM | POA: Diagnosis not present

## 2021-09-30 DIAGNOSIS — M62838 Other muscle spasm: Secondary | ICD-10-CM | POA: Diagnosis not present

## 2021-09-30 DIAGNOSIS — I330 Acute and subacute infective endocarditis: Secondary | ICD-10-CM | POA: Diagnosis not present

## 2021-09-30 DIAGNOSIS — G8220 Paraplegia, unspecified: Secondary | ICD-10-CM | POA: Diagnosis not present

## 2021-09-30 DIAGNOSIS — E43 Unspecified severe protein-calorie malnutrition: Secondary | ICD-10-CM | POA: Diagnosis not present

## 2021-10-01 DIAGNOSIS — L89324 Pressure ulcer of left buttock, stage 4: Secondary | ICD-10-CM | POA: Diagnosis not present

## 2021-10-01 DIAGNOSIS — L89310 Pressure ulcer of right buttock, unstageable: Secondary | ICD-10-CM | POA: Diagnosis not present

## 2021-10-01 DIAGNOSIS — L89894 Pressure ulcer of other site, stage 4: Secondary | ICD-10-CM | POA: Diagnosis not present

## 2021-10-02 ENCOUNTER — Ambulatory Visit (INDEPENDENT_AMBULATORY_CARE_PROVIDER_SITE_OTHER): Payer: Medicare Other | Admitting: Family

## 2021-10-02 ENCOUNTER — Encounter: Payer: Self-pay | Admitting: Family

## 2021-10-02 ENCOUNTER — Other Ambulatory Visit: Payer: Self-pay | Admitting: *Deleted

## 2021-10-02 DIAGNOSIS — M00052 Staphylococcal arthritis, left hip: Secondary | ICD-10-CM

## 2021-10-02 NOTE — Progress Notes (Signed)
Post-Op Visit Note   Patient: Anthony [name]           Date of Birth: 02-14-1970           MRN: [id number] Visit Date: 10/02/2021 PCP: Erick Alley, DO  Chief Complaint:  Chief Complaint  Patient presents with   Left Hip - Routine Post Op    09/11/21 left hip disarticulation and deb 8/25 left hip deb 8/30 deb    HPI:  HPI The patient is a 51 year old gentleman seen status post left hip disarticulation with debridement on Aug/ust 23 followed by repeat left hip debridement on August 25 and again on 8/30 Ortho Exam On examination of the left hip his incision is well approximated sutures there is no gaping no drainage no erythema   VAC removed today with no drainage in the canister  Visit Diagnoses: No diagnosis found.  Plan: Begin daily Dial soap cleansing.  Dry dressings.  Foam dressing applied today.  She will follow-up in office in 2 weeks consider suture removal  Follow-Up Instructions: No follow-ups on file.   Imaging: No results found.  Orders:  No orders of the defined types were placed in this encounter.  No orders of the defined types were placed in this encounter.    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    Endocarditis of mitral valve    Hypokalemia    Sepsis (HCC)    Osteomyelitis (HCC)    Pyogenic arthritis of left hip (HCC)    Septic arthritis of hip (HCC) 09/03/2021   Prolonged Q-T interval on ECG 09/03/2021   Pressure injury of skin of left buttock 05/13/2021   PVCs (premature ventricular contractions)    Atypical chest pain 01/04/2020   Slow transit constipation 03/06/2017   Neurogenic bladder 03/06/2017   Suprapubic catheter (HCC) 03/06/2017   Paraplegia (HCC) 01/29/2017   Pressure injury of skin 01/27/2017   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/24/2017 01/24/2017   Status post  debridement 01/24/2017   Hyperlipidemia 03/11/2014   Spina bifida of lumbar region (HCC) 09/30/2012   Routine adult health maintenance 03/26/2012   ALLERGIC RHINITIS 06/01/2007   Anxiety 03/19/2006   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   VENOUS INSUFFICIENCY, CHRONIC 03/19/2006   Pressure ulcer 03/19/2006   Past Medical History:  Diagnosis Date   Chronic indwelling Foley catheter    Complication of anesthesia    woken up in surgery before   PVCs (premature ventricular contractions)    Spina bifida    Wheelchair bound     Family History  Problem Relation Age of Onset   CAD Father        died age 81 from MI   Sudden Cardiac Death Sister        age 38   CAD Maternal Grandfather        died age 57 MI    Past Surgical History:  Procedure Laterality Date   APPLICATION OF WOUND VAC  09/11/2021   Procedure: APPLICATION OF WOUND VAC;  Surgeon: [name], MD;  Location: [facility] OR;  Service: Orthopedics;;   BACK SURGERY     BUBBLE STUDY  09/09/2021   Procedure: BUBBLE STUDY;  Surgeon: Quintella Reichert, MD;  Location: [facility] ENDOSCOPY;  Service: Cardiovascular;;   HIP SURGERY     I & D EXTREMITY Left 09/11/2021  Procedure: LEFT HIP DEBRIDEMENT AND REMOVAL FEMORAL HEAD;  Surgeon: [name], MD;  Location: [facility] OR;  Service: Orthopedics;  Laterality: Left;   I & D EXTREMITY Left 09/13/2021   Procedure: DEBRIDEMENT LEFT HIP;  Surgeon: [name], MD;  Location: [facility] OR;  Service: Orthopedics;  Laterality: Left;   I & D EXTREMITY Left 09/18/2021   Procedure: DEBRIDEMENT LEFT HIP, WOUND VAC EXCHANGE;  Surgeon: [name], MD;  Location: [facility] OR;  Service: Orthopedics;  Laterality: Left;   INCISION AND DRAINAGE OF WOUND N/A 01/24/2017   Procedure: IRRIGATION AND DEBRIDEMENT WOUND- BUTTOCK ABSCESS;  Surgeon: Almond Lint, MD;  Location: [facility] ORS;  Service: General;  Laterality: N/A;   [name] WITHOUT CARDIOVERSION N/A 09/09/2021   Procedure: TRANSESOPHAGEAL ECHOCARDIOGRAM ([name]);  Surgeon: Quintella Reichert, MD;  Location: [facility] ENDOSCOPY;  Service: Cardiovascular;  Laterality: N/A;   Social History   Occupational History   Not on file  Tobacco Use   Smoking status: Former    Types: Cigarettes   Smokeless tobacco: Never  Vaping Use   Vaping Use: Never used  Substance and Sexual Activity   Alcohol use: No   Drug use: Not on file   Sexual activity: Not on file

## 2021-10-02 NOTE — Patient Outreach (Signed)
[facility] Post- Acute Care Coordinator follow up. Mr. Meng resides in [facility].   Facility site visit to [facility]. Met with Marita Kansas, SNF SW who reports Mr. Natzke transition plan is to return home. On IV antibiotics until mid October. Lives alone.   Will continue to follow for potential [facility] care coordination needs while Mr. Jeppsen resides in SNF.   Marthenia Rolling, MSN, RN,BSN [place] Acute Care Coordinator [phone] [facility]) [phone]  (Toll free office)

## 2021-10-03 DIAGNOSIS — M009 Pyogenic arthritis, unspecified: Secondary | ICD-10-CM | POA: Diagnosis not present

## 2021-10-03 DIAGNOSIS — R7881 Bacteremia: Secondary | ICD-10-CM | POA: Diagnosis not present

## 2021-10-04 ENCOUNTER — Telehealth: Payer: Self-pay

## 2021-10-04 NOTE — Telephone Encounter (Signed)
Patient LVM on nurse line requesting light weight wheelchair.   Called patient to discuss request further and schedule appointment. Patient did not answer, LVM asking patient to return call to office.   Veronda Prude, RN

## 2021-10-08 DIAGNOSIS — L89310 Pressure ulcer of right buttock, unstageable: Secondary | ICD-10-CM | POA: Diagnosis not present

## 2021-10-08 DIAGNOSIS — L89153 Pressure ulcer of sacral region, stage 3: Secondary | ICD-10-CM | POA: Diagnosis not present

## 2021-10-08 DIAGNOSIS — L89324 Pressure ulcer of left buttock, stage 4: Secondary | ICD-10-CM | POA: Diagnosis not present

## 2021-10-08 DIAGNOSIS — L89893 Pressure ulcer of other site, stage 3: Secondary | ICD-10-CM | POA: Diagnosis not present

## 2021-10-09 ENCOUNTER — Encounter: Payer: Self-pay | Admitting: Internal Medicine

## 2021-10-09 ENCOUNTER — Ambulatory Visit (INDEPENDENT_AMBULATORY_CARE_PROVIDER_SITE_OTHER): Payer: Medicare Other | Admitting: Internal Medicine

## 2021-10-09 ENCOUNTER — Other Ambulatory Visit: Payer: Self-pay

## 2021-10-09 ENCOUNTER — Other Ambulatory Visit: Payer: Self-pay | Admitting: *Deleted

## 2021-10-09 ENCOUNTER — Telehealth: Payer: Self-pay

## 2021-10-09 VITALS — BP 137/83 | HR 107 | Temp 99.0°F

## 2021-10-09 DIAGNOSIS — R7881 Bacteremia: Secondary | ICD-10-CM

## 2021-10-09 DIAGNOSIS — M869 Osteomyelitis, unspecified: Secondary | ICD-10-CM

## 2021-10-09 DIAGNOSIS — M009 Pyogenic arthritis, unspecified: Secondary | ICD-10-CM

## 2021-10-09 DIAGNOSIS — M00851 Arthritis due to other bacteria, right hip: Secondary | ICD-10-CM | POA: Diagnosis not present

## 2021-10-09 DIAGNOSIS — I330 Acute and subacute infective endocarditis: Secondary | ICD-10-CM | POA: Diagnosis not present

## 2021-10-09 DIAGNOSIS — M8618 Other acute osteomyelitis, other site: Secondary | ICD-10-CM | POA: Diagnosis not present

## 2021-10-09 NOTE — Telephone Encounter (Signed)
Verbal orders given to Liz Malady, RN at [facility] - per Dr.Wallace - IV ampicillin and IV ceftriaxone ends on 10/30/2021 and PICC line will need to be pulled. Liz Malady, RN repeated orders back to me. Written orders was also with patient and is included in office note from 9/20. Anthony [name] voiced her understanding.    [place], CMA

## 2021-10-09 NOTE — Patient Outreach (Signed)
[place] Coordinator follow up. Mr. Spratlin resides in [facility]. Screening for potential [facility] care coordination/care management needs as benefit of insurance plan and PCP.   Facility site visit to [facility]. Mr. Glasheen was out of the facility at MD appointment. Met with SNF SW and therapy manager. Mr. Tuggle no longer receiving skilled therapy. Being skilled for iv antibiotics. Transition plan is to return home.   Will continue to follow.   Marthenia Rolling, MSN, RN,BSN [place] Acute Care Coordinator [phone] (Direct dial)

## 2021-10-09 NOTE — Patient Instructions (Signed)
See office note from today

## 2021-10-09 NOTE — Progress Notes (Signed)
[facility] for Infectious Disease  CHIEF COMPLAINT:    Follow up for endocarditis, bacteremia, septic hip, OM  SUBJECTIVE:    Anthony [name] is a 51 y.o. male with PMHx as below who presents to the clinic for for multiple infectious complications following recent admission.   Patient was admitted recent at [facility] from 09/03/21 - 09/24/21. This was a very complicated admission where he dealt with left hip septic arthritis/OM.  Status post I&D 8/23, 8/25, and 09/18/2021 with orthopedic surgery. Multiple prior cultures including bone cultures of the left hip grew Enterococcus faecalis and Bacteroides. Additionally, his admission blood cultures grew strep anginosus.  A [name] was done 09/09/21 revealed a mitral valve vegetation.  Ultimately it was challenging to discern which of the offending organism caused endocarditis as Enterococcus faecalis is more likely than strep anginosus to be a typical pathogen to cause endocarditis.  Given this uncertainty, he has been started on antibiotics to treat for endocarditis with synergy.  He received wound care for his chronic pressure ulcers.  He was discharged on ampicillin, ceftriaxone via PICC line for IV therapy.  PO flagyl was also continued.  He was discharged to SNF and will receive antibiotics through 10/30/21 (6 weeks from OR).  He had follow up last week with orthopedic surgery.  VAC was removed and incision was healing well.  He'll return next week for hopefully suture removal.   Reports today doing okay.  Better than when he was in the hospital.  He notes his incision seems to be opening up some at the superior aspect with some drainage worsened when sitting up or lying on that side.  No fevers or chills.  He is tolerating the antibiotics.   Please see A&P for the details of today's visit and status of the patient's medical problems.   Patient's Medications  New Prescriptions   No medications on file  Previous Medications    ACETAMINOPHEN (TYLENOL) 500 MG TABLET    Take 2 tablets (1,000 mg total) by mouth 2 (two) times daily as needed for mild pain, headache or fever.   AMPICILLIN IVPB    Inject 12 g into the vein daily. Indication:  Strep/Enterococcal MV IE, hip osteo First Dose: Yes Last Day of Therapy:  10/30/21 Labs - Once weekly:  CBC/D and BMP, Labs - Every other week:  ESR and CRP Method of administration: Ambulatory Pump (Continuous Infusion) - give 12g/day as a continuous infusion Method of administration may be changed at the discretion of home infusion pharmacist based upon assessment of the patient and/or caregiver's ability to self-administer the medication ordered.   CATHETERS (FOLEY CATHETER 2-WAY) MISC    Use as directed by urologist.   CEFTRIAXONE (ROCEPHIN) IVPB    Inject 2 g into the vein every 12 (twelve) hours. Indication:  Strep/Enterococcal MV IE, hip osteo First Dose: Yes Last Day of Therapy:  10/30/21 Labs - Once weekly:  CBC/D and BMP, Labs - Every other week:  ESR and CRP Method of administration: IV Push Method of administration may be changed at the discretion of home infusion pharmacist based upon assessment of the patient and/or caregiver's ability to self-administer the medication ordered.   CHLORHEXIDINE GLUCONATE CLOTH 2 % PADS    Apply 6 each topically daily.   CYCLOBENZAPRINE (FLEXERIL) 5 MG TABLET    Take 5 mg by mouth 3 (three) times daily as needed for muscle spasms.   DOCUSATE SODIUM (COLACE) 100 MG CAPSULE  Take 1 capsule (100 mg total) by mouth 2 (two) times daily.   FLECAINIDE (TAMBOCOR) 50 MG TABLET    Take 1 tablet (50 mg total) by mouth 2 (two) times daily.   IBUPROFEN (ADVIL) 600 MG TABLET    Take 1 tablet (600 mg total) by mouth every 6 (six) hours as needed for fever.   METRONIDAZOLE (FLAGYL) 500 MG TABLET    Take 1 tablet (500 mg total) by mouth 2 (two) times daily. Stop date 10/30/21   MULTIPLE VITAMIN (MULTIVITAMIN WITH MINERALS) TABS TABLET    Take 1 tablet by  mouth daily.   OMEGA-3 FATTY ACIDS (FISH OIL) 1000 MG CAPS    Take 1 capsule by mouth 2 (two) times daily.   OXYCODONE (OXY IR/ROXICODONE) 5 MG IMMEDIATE RELEASE TABLET    Take 1 tablet (5 mg total) by mouth every 4 (four) hours as needed for moderate pain (Pain score 4-6).   ROSUVASTATIN (CRESTOR) 40 MG TABLET    Take 1 tablet (40 mg total) by mouth daily.  Modified Medications   No medications on file  Discontinued Medications   No medications on file      Past Medical History:  Diagnosis Date   Chronic indwelling Foley catheter    Complication of anesthesia    woken up in surgery before   PVCs (premature ventricular contractions)    Spina bifida    Wheelchair bound     Social History   Tobacco Use   Smoking status: Former    Types: Cigarettes   Smokeless tobacco: Never  Vaping Use   Vaping Use: Never used  Substance Use Topics   Alcohol use: No    Family History  Problem Relation Age of Onset   CAD Father        died age 103 from MI   Sudden Cardiac Death Sister        age 79   CAD Maternal Grandfather        died age 53 MI    Allergies  Allergen Reactions   Vancomycin Itching    Review of Systems  All other systems reviewed and are negative.  Except as noted above.   OBJECTIVE:    Vitals:   10/09/21 1438  BP: 137/83  Pulse: (!) 107  Temp: 99 F (37.2 C)  TempSrc: Oral  SpO2: 97%   There is no height or weight on file to calculate BMI.  Physical Exam Constitutional:      Appearance: Normal appearance.  HENT:     Head: Normocephalic and atraumatic.  Eyes:     Extraocular Movements: Extraocular movements intact.     Conjunctiva/sclera: Conjunctivae normal.  Abdominal:     General: There is no distension.     Palpations: Abdomen is soft.  Musculoskeletal:     Comments: PICC line in place.  Left hip incision with sutures in place.  Bandage with some drainage on it.  Incision appears to have some dehiscence more proximally.  No warmth,  erythema.  Skin:    General: Skin is warm and dry.  Neurological:     General: No focal deficit present.     Mental Status: He is alert and oriented to person, place, and time.  Psychiatric:        Mood and Affect: Mood normal.        Behavior: Behavior normal.      Labs and Microbiology:    Latest Ref Rng & Units 09/23/2021    5:20 AM  09/22/2021    4:27 AM 09/21/2021    3:31 AM  CBC  WBC 4.0 - 10.5 K/uL 6.8  8.6  8.8   Hemoglobin 13.0 - 17.0 g/dL 7.9  7.8  7.8   Hematocrit 39.0 - 52.0 % 26.3  25.7  26.1   Platelets 150 - 400 K/uL 351  372  400       Latest Ref Rng & Units 09/18/2021    1:43 AM 09/16/2021    2:03 AM 09/13/2021   12:54 AM  CMP  Glucose 70 - 99 mg/dL 108  97  157   BUN 6 - 20 mg/dL _0 Creatinine 0.61 - 1.24 mg/dL 0.64  0.56  0.69   Sodium 135 - 145 mmol/L 137  136  138   Potassium 3.5 - 5.1 mmol/L 3.8  3.6  4.1   Chloride 98 - 111 mmol/L 105  108  109   CO2 22 - 32 mmol/L _1 Calcium 8.9 - 10.3 mg/dL 7.9  7.5  7.7        ASSESSMENT & PLAN:    1. 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 Pyogenic arthritis of left hip  3. Osteomyelitis  4. Bacteremia   Patient will continue with current antibiotics of ampicillin 12g/day, ceftriaxone 2gm BID IV, and Flagyl 559m PO q12h through 10/30/21 for Strep anginosis bacteremia, left hip septic arthritis/OM (Cx = E faecalis, bacteroides), and mitral valve endocarditis.  Continue wound care, offloading, nutrition, orthopedic follow up.  Will also require prophylactic antibiotics for future dental procedures that can be provided by his dentist or PCP.  Would remove PICC line following IV therapy.  Follow up with ID as needed.     ARaynelle Highlandfor Infectious Disease [facility] 10/09/2021, 2:55 PM

## 2021-10-10 ENCOUNTER — Telehealth: Payer: Self-pay

## 2021-10-10 ENCOUNTER — Ambulatory Visit: Payer: Medicare Other | Admitting: Internal Medicine

## 2021-10-10 DIAGNOSIS — G8220 Paraplegia, unspecified: Secondary | ICD-10-CM

## 2021-10-10 NOTE — Telephone Encounter (Signed)
Patient calls nurse line requesting an order for a new wheelchair.   Patient reports using [facility] as supplier.   Will forward to PCP to place order.

## 2021-10-11 ENCOUNTER — Other Ambulatory Visit: Payer: Self-pay | Admitting: Student

## 2021-10-14 DIAGNOSIS — M009 Pyogenic arthritis, unspecified: Secondary | ICD-10-CM | POA: Diagnosis not present

## 2021-10-14 DIAGNOSIS — R7881 Bacteremia: Secondary | ICD-10-CM | POA: Diagnosis not present

## 2021-10-14 DIAGNOSIS — I330 Acute and subacute infective endocarditis: Secondary | ICD-10-CM | POA: Diagnosis not present

## 2021-10-15 DIAGNOSIS — L89310 Pressure ulcer of right buttock, unstageable: Secondary | ICD-10-CM | POA: Diagnosis not present

## 2021-10-15 DIAGNOSIS — L89324 Pressure ulcer of left buttock, stage 4: Secondary | ICD-10-CM | POA: Diagnosis not present

## 2021-10-15 DIAGNOSIS — L89153 Pressure ulcer of sacral region, stage 3: Secondary | ICD-10-CM | POA: Diagnosis not present

## 2021-10-15 DIAGNOSIS — L89893 Pressure ulcer of other site, stage 3: Secondary | ICD-10-CM | POA: Diagnosis not present

## 2021-10-16 ENCOUNTER — Ambulatory Visit (INDEPENDENT_AMBULATORY_CARE_PROVIDER_SITE_OTHER): Payer: Medicare Other | Admitting: Family

## 2021-10-16 DIAGNOSIS — M00052 Staphylococcal arthritis, left hip: Secondary | ICD-10-CM

## 2021-10-17 ENCOUNTER — Other Ambulatory Visit: Payer: Self-pay | Admitting: *Deleted

## 2021-10-17 NOTE — Patient Outreach (Signed)
[place] Coordinator follow up. Mr. Misch resides in [facility] SN[facility]. Following for potential [facility] care coordination services as benefit of insurance plan and PCP.   Update received from [place], [place] indicating Mr. Lampert remains on IV antibiotics until 11/01/21. Transition plan is to return home alone.   Will continue to follow for potential [facility] care coordination needs.  Marthenia Rolling, MSN, RN,BSN [place] Acute Care Coordinator [phone] (Direct dial)

## 2021-10-18 ENCOUNTER — Encounter: Payer: Self-pay | Admitting: Family

## 2021-10-18 NOTE — Progress Notes (Signed)
Post-Op Visit Note   Patient: Anthony [name]           Date of Birth: Aug 13, 1970           MRN: [id number] Visit Date: 10/16/2021 PCP: Precious Gilding, DO  Chief Complaint:  Chief Complaint  Patient presents with   Left Hip - Routine Post Op    09/18/2021 left hip debridement kerecis graft     HPI:  HPI The patient is a 51 year old gentleman seen status post left hip debridement on August 30 with Kerecis graft placement.  The middle of his incision has dehisced he currently is continuing on a course of antibiotics followed by infectious disease.  He is receiving Rocephin via PICC line as well as on oral Flagyl Ortho Exam On examination of the left hip the incision is approximated with sutures centrally this is open a length of 5 cm this probes 3 cm deep there is granulation visible in the wound.  Proximal and distal sutures harvested over areas of the incision which have healed.  There is no surrounding erythema no odor no purulence  Visit Diagnoses: No diagnosis found.  Plan: Feel he would benefit from repeat graft placement.  Will set this up for next week.  Patient in agreement with the plan he will be set up for repeat irrigation debridement with Kerecis placement.  Follow-Up Instructions: No follow-ups on file.   Imaging: No results found.  Orders:  No orders of the defined types were placed in this encounter.  No orders of the defined types were placed in this encounter.    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    Endocarditis of mitral valve    Hypokalemia    Sepsis (HCC)    Osteomyelitis (HCC)    Pyogenic arthritis of left hip (HCC)    Septic arthritis of hip ([place]) 09/03/2021   Prolonged Q-T interval on ECG 09/03/2021   Pressure injury of skin of left buttock 05/13/2021   PVCs (premature  ventricular contractions)    Atypical chest pain 01/04/2020   Slow transit constipation 03/06/2017   Neurogenic bladder 03/06/2017   Suprapubic catheter ([place]) 03/06/2017   Paraplegia ([place]) 01/29/2017   Pressure injury of skin 01/27/2017   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/24/2017 01/24/2017   Status post debridement 01/24/2017   Hyperlipidemia 03/11/2014   Spina bifida of lumbar region ([place]) 09/30/2012   Routine adult health maintenance 03/26/2012   ALLERGIC RHINITIS 06/01/2007   Anxiety 03/19/2006   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   VENOUS INSUFFICIENCY, CHRONIC 03/19/2006   Pressure ulcer 03/19/2006   Past Medical History:  Diagnosis Date   Chronic indwelling Foley catheter    Complication of anesthesia    woken up in surgery before   PVCs (premature ventricular contractions)    Spina bifida    Wheelchair bound     Family History  Problem Relation Age of Onset   CAD Father        died age 29 from MI   Sudden Cardiac Death Sister        age 14   CAD Maternal Grandfather        died age 37 MI    Past Surgical History:  Procedure Laterality Date   APPLICATION OF WOUND VAC  09/11/2021   Procedure: APPLICATION OF WOUND VAC;  Surgeon: Meridee Score  V, MD;  Location: [facility];  Service: Orthopedics;;   BACK SURGERY     BUBBLE STUDY  09/09/2021   Procedure: BUBBLE STUDY;  Surgeon: Sueanne Margarita, MD;  Location: [place] [facility];  Service: Cardiovascular;;   HIP SURGERY     I & D EXTREMITY Left 09/11/2021   Procedure: LEFT HIP DEBRIDEMENT AND REMOVAL FEMORAL HEAD;  Surgeon: Newt Minion, MD;  Location: [facility];  Service: Orthopedics;  Laterality: Left;   I & D EXTREMITY Left 09/13/2021   Procedure: DEBRIDEMENT LEFT HIP;  Surgeon: Newt Minion, MD;  Location: [facility];  Service: Orthopedics;  Laterality: Left;   I & D EXTREMITY Left 09/18/2021   Procedure: DEBRIDEMENT LEFT HIP, WOUND VAC EXCHANGE;  Surgeon: Newt Minion, MD;  Location: [facility];  Service: Orthopedics;  Laterality: Left;    INCISION AND DRAINAGE OF WOUND N/A 01/24/2017   Procedure: IRRIGATION AND DEBRIDEMENT WOUND- BUTTOCK ABSCESS;  Surgeon: Stark Klein, MD;  Location: [facility];  Service: General;  Laterality: N/A;   [name] WITHOUT CARDIOVERSION N/A 09/09/2021   Procedure: TRANSESOPHAGEAL ECHOCARDIOGRAM ([name]);  Surgeon: Sueanne Margarita, MD;  Location: [facility] END[facility]COPY;  Service: Cardiovascular;  Laterality: N/A;   Social History   Occupational History   Not on file  Tobacco Use   Smoking status: Former    Types: Cigarettes   Smokeless tobacco: Never  Vaping Use   Vaping Use: Never used  Substance and Sexual Activity   Alcohol use: No   Drug use: Not on file   Sexual activity: Not on file

## 2021-10-22 DIAGNOSIS — L89310 Pressure ulcer of right buttock, unstageable: Secondary | ICD-10-CM | POA: Diagnosis not present

## 2021-10-22 DIAGNOSIS — L89894 Pressure ulcer of other site, stage 4: Secondary | ICD-10-CM | POA: Diagnosis not present

## 2021-10-22 DIAGNOSIS — L98418 Non-pressure chronic ulcer of buttock with other specified severity: Secondary | ICD-10-CM | POA: Diagnosis not present

## 2021-10-22 DIAGNOSIS — L89324 Pressure ulcer of left buttock, stage 4: Secondary | ICD-10-CM | POA: Diagnosis not present

## 2021-10-23 DIAGNOSIS — Z792 Long term (current) use of antibiotics: Secondary | ICD-10-CM | POA: Diagnosis not present

## 2021-10-23 DIAGNOSIS — R197 Diarrhea, unspecified: Secondary | ICD-10-CM | POA: Diagnosis not present

## 2021-10-24 ENCOUNTER — Other Ambulatory Visit: Payer: Self-pay | Admitting: *Deleted

## 2021-10-24 NOTE — Telephone Encounter (Signed)
Contacted patient in regards to "[employer].   I have not been able to get in touch with them. Patient reports he uses [employer] and not "[employer]."   Apologized to patient for mixup.   I contacted [place] and order was faxed over this morning.

## 2021-10-24 NOTE — Patient Outreach (Signed)
[place] Coordinator follow up. Mr. Zechman resides in [facility]. Screening for potential [facility] care coordination services as benefit of insurance plan and PCP.   Facility site visit to [facility] on 10/23/21. Met with therapy manager and SNF social workers. Mr. Schrader iv antibiotics conclude on 10/30/21. He will return home. Facility reports Mr. Bauernfeind has chronic wounds. Went to bedside to speak with Mr. Lusby about [facility] services. However, he was off the unit. [facility] literature and writer's contact information left on bedside table.  Will continue to follow.    Marthenia Rolling, MSN, RN,BSN [place] Acute Care Coordinator [phone] (Direct dial)

## 2021-10-28 ENCOUNTER — Encounter (HOSPITAL_COMMUNITY): Payer: Self-pay | Admitting: Orthopedic Surgery

## 2021-10-28 ENCOUNTER — Other Ambulatory Visit: Payer: Self-pay

## 2021-10-28 DIAGNOSIS — Z792 Long term (current) use of antibiotics: Secondary | ICD-10-CM | POA: Diagnosis not present

## 2021-10-28 DIAGNOSIS — R7881 Bacteremia: Secondary | ICD-10-CM | POA: Diagnosis not present

## 2021-10-28 NOTE — Progress Notes (Signed)
Pt is a resident at [facility]. I spoke with Kela, LPN today and she states pt is alert, oriented and can speak for himself. I have faxed pre-op instructions to [facility]. I called and spoke with Elenore Rota and he verified his PMH. Pt has hx of PVC's and is on Flecainide. He states he is not diabetic. Pt does have spina bifida and is a paraplegic.

## 2021-10-28 NOTE — Progress Notes (Signed)
Surgical Instructions   Anthony [name] procedure is scheduled on Wednesday, 10/30/21.  Report to [name] Main Entrance "A" at 7:20 AM, then check in with the Admitting office.  Call this number if you have problems the morning of surgery:  [phone]  If Anthony [name] experiences any cold or flu symptoms such as cough, fever, chills, shortness of breath, etc. between now and your scheduled surgery, please notify us at the above number.     Anthony [name] is not to eat after midnight the night before surgery, Tuesday, 10/29/21.  He may drink clear liquids until 6:50 AM the morning of his surgery.   Clear liquids allowed are: Water, Non-Citrus Juices (without pulp), Carbonated Beverages, Clear Tea, Black Coffee ONLY (NO MILK, CREAM OR POWDERED CREAMER of any kind), and Gatorade    Take these medicines the morning of surgery with A SIP OF WATER: Flecainide (Tambocor) Rosuvastatin (Crestor) Cyclobenzaprine (Flexeril) - prn Acetaminophen (Tylenol) - prn  As of today, hold Aleve, Naproxen, Ibuprofen, Motrin, Advil, Goody's, BC's, all herbal medications, fish oil, and all vitamins.         Do not wear jewelry. Do not wear lotions, powders, cologne or deodorant. Do not shave 48 hours prior to surgery.  Men may shave face and neck. Do not bring valuables to the hospital.   Oral Hygiene is also important to reduce your risk of infection.  Remember - BRUSH YOUR TEETH THE MORNING OF SURGERY WITH YOUR REGULAR TOOTHPASTE  [place]- Preparing For Surgery  Before surgery, you can play an important role. Because skin is not sterile, your skin needs to be as free of germs as possible. You can reduce the number of germs on your skin by washing with CHG (chlorahexidine gluconate) Soap before surgery.  CHG is an antiseptic cleaner which kills germs and bonds with the skin to continue killing germs even after washing.    Please do not use if you have an allergy to CHG or antibacterial soaps. If your skin  becomes reddened/irritated stop using the CHG.  Do not shave (including legs and underarms) for at least 48 hours prior to first CHG shower. It is OK to shave your face.  Please follow these instructions carefully.    Shower the NIGHT BEFORE SURGERY and the MORNING OF SURGERY with CHG Soap.   If you chose to wash your hair, wash your hair first as usual with your normal shampoo. After you shampoo, rinse your hair and body thoroughly to remove the shampoo.  Then [employer] and genitals (private parts) with your normal soap and rinse thoroughly to remove soap.  After that Use CHG Soap as you would any other liquid soap. You can apply CHG directly to the skin and wash gently with a scrungie or a clean washcloth.   Apply the CHG Soap to your body ONLY FROM THE NECK DOWN.  Do not use on open wounds or open sores. Avoid contact with your eyes, ears, mouth and genitals (private parts). Wash Face and genitals (private parts)  with your normal soap.   Wash thoroughly, paying special attention to the area where your surgery will be performed.  Thoroughly rinse your body with warm water from the neck down.  DO NOT shower/wash with your normal soap after using and rinsing off the CHG Soap.  Pat yourself dry with a CLEAN TOWEL.  Wear CLEAN PAJAMAS to bed the night before surgery  Place CLEAN SHEETS on your bed the night before your surgery  DO NOT SLEEP WITH PETS.  Day of Surgery: Take a shower with CHG soap. Wear Clean/Comfortable clothing the morning of surgery Do not apply any deodorants/lotions.   Remember to brush your teeth WITH YOUR REGULAR TOOTHPASTE.

## 2021-10-29 DIAGNOSIS — L89324 Pressure ulcer of left buttock, stage 4: Secondary | ICD-10-CM | POA: Diagnosis not present

## 2021-10-29 DIAGNOSIS — L89894 Pressure ulcer of other site, stage 4: Secondary | ICD-10-CM | POA: Diagnosis not present

## 2021-10-29 DIAGNOSIS — L89310 Pressure ulcer of right buttock, unstageable: Secondary | ICD-10-CM | POA: Diagnosis not present

## 2021-10-29 DIAGNOSIS — L98418 Non-pressure chronic ulcer of buttock with other specified severity: Secondary | ICD-10-CM | POA: Diagnosis not present

## 2021-10-30 ENCOUNTER — Inpatient Hospital Stay (HOSPITAL_BASED_OUTPATIENT_CLINIC_OR_DEPARTMENT_OTHER): Payer: Medicare Other | Admitting: Physician Assistant

## 2021-10-30 ENCOUNTER — Encounter (HOSPITAL_COMMUNITY): Payer: Self-pay | Admitting: Orthopedic Surgery

## 2021-10-30 ENCOUNTER — Other Ambulatory Visit: Payer: Self-pay

## 2021-10-30 ENCOUNTER — Encounter (HOSPITAL_COMMUNITY)
Admission: RE | Disposition: A | Discharge status: Skilled Nursing Facility | Payer: Self-pay | Source: Home / Self Care | Attending: Orthopedic Surgery

## 2021-10-30 ENCOUNTER — Observation Stay (HOSPITAL_COMMUNITY)
Admission: RE | Admit: 2021-10-30 | Discharge: 2021-11-01 | Disposition: A | Discharge status: Skilled Nursing Facility | Payer: Medicare Other | Attending: Orthopedic Surgery | Admitting: Orthopedic Surgery

## 2021-10-30 ENCOUNTER — Inpatient Hospital Stay (HOSPITAL_COMMUNITY): Payer: Medicare Other | Admitting: Physician Assistant

## 2021-10-30 DIAGNOSIS — Z87891 Personal history of nicotine dependence: Secondary | ICD-10-CM | POA: Insufficient documentation

## 2021-10-30 DIAGNOSIS — D649 Anemia, unspecified: Secondary | ICD-10-CM | POA: Diagnosis not present

## 2021-10-30 DIAGNOSIS — Z9889 Other specified postprocedural states: Secondary | ICD-10-CM

## 2021-10-30 DIAGNOSIS — Z993 Dependence on wheelchair: Secondary | ICD-10-CM | POA: Insufficient documentation

## 2021-10-30 DIAGNOSIS — F419 Anxiety disorder, unspecified: Secondary | ICD-10-CM

## 2021-10-30 DIAGNOSIS — M009 Pyogenic arthritis, unspecified: Principal | ICD-10-CM | POA: Insufficient documentation

## 2021-10-30 DIAGNOSIS — I10 Essential (primary) hypertension: Secondary | ICD-10-CM | POA: Diagnosis not present

## 2021-10-30 DIAGNOSIS — M00052 Staphylococcal arthritis, left hip: Secondary | ICD-10-CM | POA: Diagnosis not present

## 2021-10-30 DIAGNOSIS — G8220 Paraplegia, unspecified: Secondary | ICD-10-CM | POA: Insufficient documentation

## 2021-10-30 DIAGNOSIS — Q059 Spina bifida, unspecified: Secondary | ICD-10-CM | POA: Diagnosis not present

## 2021-10-30 HISTORY — PX: I & D EXTREMITY: SHX5045

## 2021-10-30 HISTORY — DX: Anxiety disorder, unspecified: F41.9

## 2021-10-30 HISTORY — DX: Paraplegia, unspecified: G82.20

## 2021-10-30 HISTORY — DX: Essential (primary) hypertension: I10

## 2021-10-30 HISTORY — DX: Sepsis, unspecified organism: A41.9

## 2021-10-30 HISTORY — DX: Osteomyelitis, unspecified: M86.9

## 2021-10-30 HISTORY — DX: Cardiac arrhythmia, unspecified: I49.9

## 2021-10-30 HISTORY — DX: Rheumatic mitral valve disease, unspecified: I05.9

## 2021-10-30 HISTORY — DX: Other specified postprocedural states: Z98.890

## 2021-10-30 HISTORY — DX: Sepsis, unspecified organism: R65.21

## 2021-10-30 LAB — CBC
HCT: 34.4 % — ABNORMAL LOW (ref 39.0–52.0)
Hemoglobin: 10.4 g/dL — ABNORMAL LOW (ref 13.0–17.0)
MCH: 25.9 pg — ABNORMAL LOW (ref 26.0–34.0)
MCHC: 30.2 g/dL (ref 30.0–36.0)
MCV: 85.6 fL (ref 80.0–100.0)
Platelets: 356 10*3/uL (ref 150–400)
RBC: 4.02 MIL/uL — ABNORMAL LOW (ref 4.22–5.81)
RDW: 14.6 % (ref 11.5–15.5)
WBC: 8.3 10*3/uL (ref 4.0–10.5)
nRBC: 0 % (ref 0.0–0.2)

## 2021-10-30 LAB — BASIC METABOLIC PANEL
Anion gap: 10 (ref 5–15)
BUN: 5 mg/dL — ABNORMAL LOW (ref 6–20)
CO2: 24 mmol/L (ref 22–32)
Calcium: 8.6 mg/dL — ABNORMAL LOW (ref 8.9–10.3)
Chloride: 109 mmol/L (ref 98–111)
Creatinine, Ser: 0.47 mg/dL — ABNORMAL LOW (ref 0.61–1.24)
GFR, Estimated: >60 mL/min (ref >60)
Glucose, Bld: 104 mg/dL — ABNORMAL HIGH (ref 70–99)
Potassium: 3.3 mmol/L — ABNORMAL LOW (ref 3.5–5.1)
Sodium: 143 mmol/L (ref 135–145)

## 2021-10-30 LAB — HEMOGLOBIN AND HEMATOCRIT, BLOOD
HCT: 21.8 % — ABNORMAL LOW (ref 39.0–52.0)
Hemoglobin: 6.7 g/dL — CL (ref 13.0–17.0)

## 2021-10-30 LAB — PREPARE RBC (CROSSMATCH)

## 2021-10-30 SURGERY — IRRIGATION AND DEBRIDEMENT EXTREMITY
Anesthesia: General | Laterality: Left

## 2021-10-30 MED ORDER — OXYCODONE HCL 5 MG PO TABS
ORAL_TABLET | ORAL | Status: AC
  Filled 2021-10-30: qty 1

## 2021-10-30 MED ORDER — 0.9 % SODIUM CHLORIDE (POUR BTL) OPTIME
TOPICAL | Status: DC | PRN
  Administered 2021-10-30: 2000 mL

## 2021-10-30 MED ORDER — ACETAMINOPHEN 500 MG PO TABS
1000.0000 mg | ORAL_TABLET | Freq: Once | ORAL | Status: AC
  Administered 2021-10-30: 1000 mg via ORAL

## 2021-10-30 MED ORDER — ACETAMINOPHEN 500 MG PO TABS
ORAL_TABLET | ORAL | Status: AC
  Filled 2021-10-30: qty 2

## 2021-10-30 MED ORDER — LIDOCAINE 2% (20 MG/ML) 5 ML SYRINGE
INTRAMUSCULAR | Status: DC | PRN
  Administered 2021-10-30: 60 mg via INTRAVENOUS

## 2021-10-30 MED ORDER — MIDAZOLAM HCL 5 MG/5ML IJ SOLN
INTRAMUSCULAR | Status: DC | PRN
  Administered 2021-10-30: 2 mg via INTRAVENOUS

## 2021-10-30 MED ORDER — PROPOFOL 10 MG/ML IV BOLUS
INTRAVENOUS | Status: AC
  Filled 2021-10-30: qty 20

## 2021-10-30 MED ORDER — OXYCODONE-ACETAMINOPHEN 5-325 MG PO TABS: 1.0000 | ORAL_TABLET | ORAL | 0 refills | Status: DC | PRN

## 2021-10-30 MED ORDER — EPHEDRINE SULFATE-NACL 50-0.9 MG/10ML-% IV SOSY
PREFILLED_SYRINGE | INTRAVENOUS | Status: DC | PRN
  Administered 2021-10-30 (×3): 5 mg via INTRAVENOUS

## 2021-10-30 MED ORDER — FENTANYL CITRATE (PF) 250 MCG/5ML IJ SOLN
INTRAMUSCULAR | Status: AC
  Filled 2021-10-30: qty 5

## 2021-10-30 MED ORDER — FENTANYL CITRATE (PF) 100 MCG/2ML IJ SOLN
INTRAMUSCULAR | Status: AC
  Filled 2021-10-30: qty 2

## 2021-10-30 MED ORDER — FENTANYL CITRATE (PF) 100 MCG/2ML IJ SOLN
25.0000 ug | INTRAMUSCULAR | Status: DC | PRN
  Administered 2021-10-30: 50 ug via INTRAVENOUS
  Administered 2021-10-30 (×2): 25 ug via INTRAVENOUS

## 2021-10-30 MED ORDER — CEFAZOLIN SODIUM-DEXTROSE 2-4 GM/100ML-% IV SOLN
2.0000 g | INTRAVENOUS | Status: AC
  Administered 2021-10-30: 2 g via INTRAVENOUS
  Filled 2021-10-30: qty 100

## 2021-10-30 MED ORDER — KETOROLAC TROMETHAMINE 30 MG/ML IJ SOLN
INTRAMUSCULAR | Status: AC
  Filled 2021-10-30: qty 1

## 2021-10-30 MED ORDER — MIDAZOLAM HCL 2 MG/2ML IJ SOLN
INTRAMUSCULAR | Status: AC
  Filled 2021-10-30: qty 2

## 2021-10-30 MED ORDER — HEPARIN SOD (PORK) LOCK FLUSH 100 UNIT/ML IV SOLN
250.0000 [IU] | INTRAVENOUS | Status: AC | PRN
  Administered 2021-10-30: 250 [IU]

## 2021-10-30 MED ORDER — ONDANSETRON HCL 4 MG/2ML IJ SOLN
INTRAMUSCULAR | Status: DC | PRN
  Administered 2021-10-30: 4 mg via INTRAVENOUS

## 2021-10-30 MED ORDER — LACTATED RINGERS IV SOLN: INTRAVENOUS | Status: DC

## 2021-10-30 MED ORDER — OXYCODONE HCL 5 MG PO TABS
5.0000 mg | ORAL_TABLET | Freq: Once | ORAL | Status: AC | PRN
  Administered 2021-10-30: 5 mg via ORAL

## 2021-10-30 MED ORDER — ALBUMIN HUMAN 5 % IV SOLN
INTRAVENOUS | Status: AC
  Filled 2021-10-30: qty 250

## 2021-10-30 MED ORDER — OXYCODONE HCL 5 MG/5ML PO SOLN: 5.0000 mg | Freq: Once | ORAL | Status: AC | PRN

## 2021-10-30 MED ORDER — FENTANYL CITRATE (PF) 100 MCG/2ML IJ SOLN
INTRAMUSCULAR | Status: DC | PRN
  Administered 2021-10-30 (×2): 50 ug via INTRAVENOUS

## 2021-10-30 MED ORDER — OXYCODONE-ACETAMINOPHEN 5-325 MG PO TABS
1.0000 | ORAL_TABLET | ORAL | Status: DC | PRN
  Administered 2021-10-30 – 2021-10-31 (×3): 1 via ORAL
  Filled 2021-10-30 (×3): qty 1

## 2021-10-30 MED ORDER — PROPOFOL 10 MG/ML IV BOLUS
INTRAVENOUS | Status: DC | PRN
  Administered 2021-10-30: 130 mg via INTRAVENOUS

## 2021-10-30 MED ORDER — KETOROLAC TROMETHAMINE 30 MG/ML IJ SOLN
INTRAMUSCULAR | Status: DC | PRN
  Administered 2021-10-30: 30 mg via INTRAVENOUS

## 2021-10-30 MED ORDER — CHLORHEXIDINE GLUCONATE 0.12 % MT SOLN
15.0000 mL | Freq: Once | OROMUCOSAL | Status: AC
  Administered 2021-10-30: 15 mL via OROMUCOSAL
  Filled 2021-10-30: qty 15

## 2021-10-30 MED ORDER — PHENYLEPHRINE 80 MCG/ML (10ML) SYRINGE FOR IV PUSH (FOR BLOOD PRESSURE SUPPORT)
PREFILLED_SYRINGE | INTRAVENOUS | Status: DC | PRN
  Administered 2021-10-30 (×2): 160 ug via INTRAVENOUS
  Administered 2021-10-30: 80 ug via INTRAVENOUS
  Administered 2021-10-30: 160 ug via INTRAVENOUS
  Administered 2021-10-30: 80 ug via INTRAVENOUS
  Administered 2021-10-30: 160 ug via INTRAVENOUS
  Administered 2021-10-30: 80 ug via INTRAVENOUS

## 2021-10-30 MED ORDER — ORAL CARE MOUTH RINSE: 15.0000 mL | Freq: Once | OROMUCOSAL | Status: AC

## 2021-10-30 MED ORDER — ONDANSETRON HCL 4 MG/2ML IJ SOLN
INTRAMUSCULAR | Status: AC
  Filled 2021-10-30: qty 2

## 2021-10-30 SURGICAL SUPPLY — 41 items
ADH SKN CLS APL DERMABOND .7 (GAUZE/BANDAGES/DRESSINGS) ×5
BAG COUNTER SPONGE SURGICOUNT (BAG) IMPLANT
BAG SPNG CNTER NS LX DISP (BAG)
BLADE SURG 21 STRL SS (BLADE) ×1 IMPLANT
BNDG COHESIVE 6X5 TAN STRL LF (GAUZE/BANDAGES/DRESSINGS) IMPLANT
BNDG GAUZE DERMACEA FLUFF 4 (GAUZE/BANDAGES/DRESSINGS) ×2 IMPLANT
BNDG GZE DERMACEA 4 6PLY (GAUZE/BANDAGES/DRESSINGS) ×2
COVER SURGICAL LIGHT HANDLE (MISCELLANEOUS) ×2 IMPLANT
DERMABOND ADVANCED .7 DNX12 (GAUZE/BANDAGES/DRESSINGS) IMPLANT
DRAPE DERMATAC (DRAPES) IMPLANT
DRAPE U-SHAPE 47X51 STRL (DRAPES) ×1 IMPLANT
DRESSING PEEL AND PLC PRVNA 13 (GAUZE/BANDAGES/DRESSINGS) IMPLANT
DRESSING PREVENA PLUS CUSTOM (GAUZE/BANDAGES/DRESSINGS) IMPLANT
DRSG ADAPTIC 3X8 NADH LF (GAUZE/BANDAGES/DRESSINGS) ×1 IMPLANT
DRSG PEEL AND PLACE PREVENA 13 (GAUZE/BANDAGES/DRESSINGS) ×1
DRSG PREVENA PLUS CUSTOM (GAUZE/BANDAGES/DRESSINGS) ×1
DURAPREP 26ML APPLICATOR (WOUND CARE) ×1 IMPLANT
ELECT REM PT RETURN 9FT ADLT (ELECTROSURGICAL)
ELECTRODE REM PT RTRN 9FT ADLT (ELECTROSURGICAL) IMPLANT
GAUZE SPONGE 4X4 12PLY STRL (GAUZE/BANDAGES/DRESSINGS) ×1 IMPLANT
GLOVE BIOGEL PI IND STRL 9 (GLOVE) ×1 IMPLANT
GLOVE SURG ORTHO 9.0 STRL STRW (GLOVE) ×1 IMPLANT
GOWN STRL REUS W/ TWL XL LVL3 (GOWN DISPOSABLE) ×2 IMPLANT
GOWN STRL REUS W/TWL XL LVL3 (GOWN DISPOSABLE) ×2
GRAFT SKIN WND SURGICLOSE M95 (Tissue) IMPLANT
HANDPIECE INTERPULSE COAX TIP (DISPOSABLE)
KIT BASIN OR (CUSTOM PROCEDURE TRAY) ×1 IMPLANT
KIT DRSG PREVENA PLUS 7DAY 125 (MISCELLANEOUS) IMPLANT
KIT TURNOVER KIT B (KITS) ×1 IMPLANT
MANIFOLD NEPTUNE II (INSTRUMENTS) ×1 IMPLANT
NS IRRIG 1000ML POUR BTL (IV SOLUTION) ×1 IMPLANT
PACK ORTHO EXTREMITY (CUSTOM PROCEDURE TRAY) ×1 IMPLANT
PAD ARMBOARD 7.5X6 YLW CONV (MISCELLANEOUS) ×2 IMPLANT
SET HNDPC FAN SPRY TIP SCT (DISPOSABLE) IMPLANT
STOCKINETTE IMPERVIOUS 9X36 MD (GAUZE/BANDAGES/DRESSINGS) IMPLANT
SUT ETHILON 2 0 PSLX (SUTURE) ×1 IMPLANT
SWAB COLLECTION DEVICE MRSA (MISCELLANEOUS) ×1 IMPLANT
SWAB CULTURE ESWAB REG 1ML (MISCELLANEOUS) IMPLANT
TOWEL GREEN STERILE (TOWEL DISPOSABLE) ×1 IMPLANT
TUBE CONNECTING 12X1/4 (SUCTIONS) ×1 IMPLANT
YANKAUER SUCT BULB TIP NO VENT (SUCTIONS) ×1 IMPLANT

## 2021-10-30 NOTE — H&P (Signed)
Anthony [name] is an 51 y.o. male.   Chief Complaint: Left hip wound. HPI: The patient is a 51 year old gentleman seen status post left hip debridement on August 30 with Kerecis graft placement.  The middle of his incision has dehisced he currently is continuing on a course of antibiotics followed by infectious disease.  He is receiving Rocephin via PICC line as well as on oral Flagyl   Past Medical History:  Diagnosis Date   Anxiety    Chronic indwelling Foley catheter    Complication of anesthesia    woken up in surgery before   Dysrhythmia    Endocarditis of mitral valve    Hypertension    Osteomyelitis ([place])    left hip   Paraplegia (HCC)    secondary to Spina Bifida   PVCs (premature ventricular contractions)    Septic shock ([place])    due to osteomyelitis   Spina bifida    Wheelchair bound     Past Surgical History:  Procedure Laterality Date   APPLICATION OF WOUND VAC  09/11/2021   Procedure: APPLICATION OF WOUND VAC;  Surgeon: Newt Minion, MD;  Location: [place];  Service: Orthopedics;;   BACK SURGERY     BUBBLE STUDY  09/09/2021   Procedure: BUBBLE STUDY;  Surgeon: Sueanne Margarita, MD;  Location: [place];  Service: Cardiovascular;;   HIP SURGERY     I & D EXTREMITY Left 09/11/2021   Procedure: LEFT HIP DEBRIDEMENT AND REMOVAL FEMORAL HEAD;  Surgeon: Newt Minion, MD;  Location: [place];  Service: Orthopedics;  Laterality: Left;   I & D EXTREMITY Left 09/13/2021   Procedure: DEBRIDEMENT LEFT HIP;  Surgeon: Newt Minion, MD;  Location: [place];  Service: Orthopedics;  Laterality: Left;   I & D EXTREMITY Left 09/18/2021   Procedure: DEBRIDEMENT LEFT HIP, WOUND VAC EXCHANGE;  Surgeon: Newt Minion, MD;  Location: [place];  Service: Orthopedics;  Laterality: Left;   INCISION AND DRAINAGE OF WOUND N/A 01/24/2017   Procedure: IRRIGATION AND DEBRIDEMENT WOUND- BUTTOCK ABSCESS;  Surgeon: Stark Klein, MD;  [facility];  Service: General;  Laterality: N/A;   [name] WITHOUT  CARDIOVERSION N/A 09/09/2021   Procedure: TRANSESOPHAGEAL ECHOCARDIOGRAM ([name]);  Surgeon: Sueanne Margarita, MD;  Location: [facility] ENDOSCOPY;  Service: Cardiovascular;  Laterality: N/A;    Family History  Problem Relation Age of Onset   CAD Father        died age 90 from MI   Sudden Cardiac Death Sister        age 67   CAD Maternal Grandfather        died age 36 MI   Social History:  reports that he has quit smoking. His smoking use included cigarettes. He has never used smokeless tobacco. He reports that he does not drink alcohol. No history on file for drug use.  Allergies:  Allergies  Allergen Reactions   Vancomycin Itching    No medications prior to admission.    No results found for this or any previous visit (from the past 48 hour(s)). No results found.  Review of Systems  All other systems reviewed and are negative.   There were no vitals taken for this visit. Physical Exam  On examination of the left hip the incision is approximated with sutures centrally this is open a length of 5 cm this probes 3 cm deep there is granulation visible in the wound.  Proximal and distal sutures harvested over areas of  the incision which have healed.  There is no surrounding erythema no odor no purulence  Assessment/Plan Assessment: Persistent wound left hip status post debridement and application of tissue graft.  Plan: We will plan for repeat debridement and placement of tissue graft and a wound VAC.  Risk and benefits were discussed including need for additional tissue grafting.  Patient states he understands wished to proceed at this time.  [name], MD 10/30/2021, 6:44 AM

## 2021-10-30 NOTE — Anesthesia Procedure Notes (Signed)
Procedure Name: LMA Insertion Date/Time: 10/30/2021 12:49 PM  Performed by: Gwyndolyn Saxon, CRNAPre-anesthesia Checklist: Patient identified, Emergency Drugs available, Suction available and Patient being monitored Patient Re-evaluated:Patient Re-evaluated prior to induction Oxygen Delivery Method: Circle system utilized Preoxygenation: Pre-oxygenation with 100% oxygen Induction Type: IV induction Ventilation: Mask ventilation without difficulty LMA: LMA inserted LMA Size: 4.0 Number of attempts: 1 Placement Confirmation: positive ETCO2 and breath sounds checked- equal and bilateral Tube secured with: Tape Dental Injury: Teeth and Oropharynx as per pre-operative assessment

## 2021-10-30 NOTE — Progress Notes (Signed)
Patient ready for discharge, awaiting transportation arrival. Patient wheeled out to [place] and ride was still 30 minutes away, patient returned to PACU room 16, where he became diaphoretic, pale and complaining of blurry vision- BP 64/47, Dr Tobias Alexander made aware. Patient transferred to bed from wheelchair. Dr. Sharol Given notified and will be admitted for observation.

## 2021-10-30 NOTE — Op Note (Signed)
10/30/2021  1:43 PM  PATIENT:  Anthony [name]    PRE-OPERATIVE DIAGNOSIS:  Left Hip Pyogenic Arthritis  POST-OPERATIVE DIAGNOSIS:  Same  PROCEDURE:  LEFT HIP EXCISIONAL DEBRIDEMENT OF SKIN AND SOFT TISSUE MUSCLE AND BONE FROM THE PELVIS APPLICATION 95 CM KERECIS MICRO GRAFT Local tissue rearrangement for wound closure 20 x 10 cm Application Prevena customizable wound VAC Tissue obtained for cultures and sent for micro  SURGEON:  Newt Minion, MD  PHYSICIAN ASSISTANT:None ANESTHESIA:   General  PREOPERATIVE INDICATIONS:  [name] is a  52 y.o. male with a diagnosis of Left Hip Pyogenic Arthritis who failed conservative measures and elected for surgical management.    The risks benefits and alternatives were discussed with the patient preoperatively including but not limited to the risks of infection, bleeding, nerve injury, cardiopulmonary complications, the need for revision surgery, among others, and the patient was willing to proceed.  OPERATIVE IMPLANTS: Kerecis micro powder 95 cm for wound surface area 200 cm  @ENCIMAGES @  OPERATIVE FINDINGS: There was no purulence there was nonviable tissue and bone and soft tissue was sent for cultures  OPERATIVE PROCEDURE: Patient was brought the operating room and underwent a general anesthetic.  After adequate levels anesthesia were obtained patient's left lower extremity was prepped using DuraPrep draped into a sterile field a timeout was called.  Elliptical incision was made around the dehisced wound this left a wound that was 20 x 10 cm.  The wound extended down to the acetabulum and there was nonviable bony changes and this was debrided with a rondure and Cobb elevator back to bleeding viable bone.  The proximal femur was also debrided back to bleeding viable bone.  Fibrinous tissue and nonviable tissue was excised with a knife and rondure.  Wound was irrigated with normal saline electrocautery was used hemostasis the wound  margins were healthy viable and bleeding.  95 cm of Kerecis micro powder was applied.  Local tissue rearrangement was used to close the wound 10 x 20 cm with 2-0 nylon.  The [name] customizable wound VAC was applied this had a good suction fit patient was extubated taken the PACU in stable condition  Debridement type: Excisional Debridement  Side: left  Body Location: hip   Tools used for debridement: scalpel and rongeur  Pre-debridement Wound size (cm):   Length: 5        Width: 5     Depth: 1   Post-debridement Wound size (cm):   Length: 20        Width: 10     Depth: 5   Debridement depth beyond dead/damaged tissue down to healthy viable tissue: yes  Tissue layer involved: skin, subcutaneous tissue, muscle / fascia, bone  Nature of tissue removed: Slough, Nutritional therapist, and Non-viable tissue  Irrigation volume: 1 liter     Irrigation fluid type: Normal Saline     DISCHARGE PLANNING:  Antibiotic duration: Continue IV antibiotics as per infectious disease  Weightbearing: Not applicable  Pain medication: Prescription for Percocet  Dressing care/ Wound VAC: Continue wound VAC for 1 week  Ambulatory devices: Transfer training  Discharge to: Home.  Follow-up: In the office 1 week post operative.

## 2021-10-30 NOTE — Anesthesia Preprocedure Evaluation (Signed)
Anesthesia Evaluation  Patient identified by MRN, date of birth, ID band Patient awake    Reviewed: Allergy & Precautions, H&P , NPO status , Patient's Chart, lab work & pertinent test results  Airway Mallampati: II   Neck ROM: full    Dental   Pulmonary former smoker,    breath sounds clear to auscultation       Cardiovascular hypertension,  Rhythm:regular Rate:Normal     Neuro/Psych PSYCHIATRIC DISORDERS Anxiety Paraplegic. H/o spina bifida    GI/Hepatic   Endo/Other    Renal/GU      Musculoskeletal  (+) Arthritis ,   Abdominal   Peds  Hematology  (+) Blood dyscrasia, anemia ,   Anesthesia Other Findings   Reproductive/Obstetrics                             Anesthesia Physical Anesthesia Plan  ASA: 3  Anesthesia Plan: General   Post-op Pain Management:    Induction: Intravenous  PONV Risk Score and Plan: 2 and Ondansetron, Dexamethasone, Midazolam and Treatment may vary due to age or medical condition  Airway Management Planned: LMA  Additional Equipment:   Intra-op Plan:   Post-operative Plan: Extubation in OR  Informed Consent: I have reviewed the patients History and Physical, chart, labs and discussed the procedure including the risks, benefits and alternatives for the proposed anesthesia with the patient or authorized representative who has indicated his/her understanding and acceptance.     Dental advisory given  Plan Discussed with: CRNA, Anesthesiologist and Surgeon  Anesthesia Plan Comments:         Anesthesia Quick Evaluation

## 2021-10-30 NOTE — Transfer of Care (Signed)
Immediate Anesthesia Transfer of Care Note  Patient: Anthony [name]  Procedure(s) Performed: LEFT HIP DEBRIDEMENT WITH KERECIS PLACEMENT (Left)  Patient Location: PACU  Anesthesia Type:General  Level of Consciousness: drowsy and patient cooperative  Airway & Oxygen Therapy: Patient Spontanous Breathing  Post-op Assessment: Report given to RN and Post -op Vital signs reviewed and stable  Post vital signs: Reviewed and stable  Last Vitals:  Vitals Value Taken Time  BP 128/76 10/30/21 1332  Temp 37 C 10/30/21 1330  Pulse 111 10/30/21 1335  Resp 17 10/30/21 1335  SpO2 96 % 10/30/21 1335  Vitals shown include unvalidated device data.  Last Pain:  Vitals:   10/30/21 1052  TempSrc: Oral  PainSc:       Patients Stated Pain Goal: 0 (85/92/92 4462)  Complications: No notable events documented.

## 2021-10-30 NOTE — Progress Notes (Signed)
CRITICAL RESULT PROVIDER NOTIFICATION  Test performed and critical result:  Hgb 6.4  Date and time result received:  10/30/21, 1807  Provider name/title: Dr. Sharol Given  Date and time provider notified: 10/30/21,1809  Date and time provider responded: 10/30/21, 0240  Provider response:See new orders   Type and screen, transfuse 1 unit.

## 2021-10-30 NOTE — Progress Notes (Signed)
Per blood bank, blood product will take a while to prepare PRBC. Patient is made aware. VSS. No distress noted.

## 2021-10-30 NOTE — Interval H&P Note (Signed)
History and Physical Interval Note:  10/30/2021 9:34 AM  Anthony [name]  has presented today for surgery, with the diagnosis of Left Hip Pyogenic Arthritis.  The various methods of treatment have been discussed with the patient and family. After consideration of risks, benefits and other options for treatment, the patient has consented to  Procedure(s): LEFT HIP [place] (Left) as a surgical intervention.  The patient's history has been reviewed, patient examined, no change in status, stable for surgery.  I have reviewed the patient's chart and labs.  Questions were answered to the patient's satisfaction.     Newt Minion

## 2021-10-31 ENCOUNTER — Encounter (HOSPITAL_COMMUNITY): Payer: Self-pay | Admitting: Orthopedic Surgery

## 2021-10-31 DIAGNOSIS — M009 Pyogenic arthritis, unspecified: Secondary | ICD-10-CM | POA: Diagnosis not present

## 2021-10-31 DIAGNOSIS — Z993 Dependence on wheelchair: Secondary | ICD-10-CM | POA: Diagnosis not present

## 2021-10-31 DIAGNOSIS — I10 Essential (primary) hypertension: Secondary | ICD-10-CM | POA: Diagnosis not present

## 2021-10-31 DIAGNOSIS — Q059 Spina bifida, unspecified: Secondary | ICD-10-CM | POA: Diagnosis not present

## 2021-10-31 DIAGNOSIS — Z87891 Personal history of nicotine dependence: Secondary | ICD-10-CM | POA: Diagnosis not present

## 2021-10-31 DIAGNOSIS — G8220 Paraplegia, unspecified: Secondary | ICD-10-CM | POA: Diagnosis not present

## 2021-10-31 LAB — HEMOGLOBIN AND HEMATOCRIT, BLOOD
HCT: 24.4 % — ABNORMAL LOW (ref 39.0–52.0)
HCT: 26.3 % — ABNORMAL LOW (ref 39.0–52.0)
Hemoglobin: 7.8 g/dL — ABNORMAL LOW (ref 13.0–17.0)
Hemoglobin: 8.4 g/dL — ABNORMAL LOW (ref 13.0–17.0)

## 2021-10-31 LAB — PREPARE RBC (CROSSMATCH)

## 2021-10-31 MED ORDER — ACETAMINOPHEN 500 MG PO TABS
500.0000 mg | ORAL_TABLET | Freq: Four times a day (QID) | ORAL | Status: DC | PRN
  Administered 2021-10-31: 500 mg via ORAL
  Filled 2021-10-31: qty 1

## 2021-10-31 MED ORDER — SODIUM CHLORIDE 0.9% FLUSH: 10.0000 mL | INTRAVENOUS | Status: DC | PRN

## 2021-10-31 MED ORDER — SODIUM CHLORIDE 0.9% IV SOLUTION: Freq: Once | INTRAVENOUS | Status: AC

## 2021-10-31 MED ORDER — CHLORHEXIDINE GLUCONATE CLOTH 2 % EX PADS
6.0000 | MEDICATED_PAD | Freq: Every day | CUTANEOUS | Status: DC
  Administered 2021-10-31 – 2021-11-01 (×2): 6 via TOPICAL

## 2021-10-31 NOTE — TOC Initial Note (Signed)
Transition of Care [facility]) - Initial/Assessment Note    Patient Details  Name: Anthony [name] MRN: [id number] Date of Birth: 1970-04-27  Transition of Care [facility]) CM/SW Contact:    Marilu Favre, RN Phone Number: 10/31/2021, 11:37 AM  Clinical Narrative:                 Spoke to patient at bedside. Confirmed face sheet information.   Patient states he is from [facility] and wants to return.   SW aware   Expected Discharge Plan: [place] Barriers to Discharge: Continued Medical Work up   Patient Goals and CMS Choice Patient states their goals for this hospitalization and ongoing recovery are:: to return to [facility]      Expected Discharge Plan and Services Expected Discharge Plan: [place]       Living arrangements for the past 2 months: Apartment Expected Discharge Date: 10/30/21                 DME Agency: NA       HH Arranged: NA          Prior Living Arrangements/Services Living arrangements for the past 2 months: Apartment Lives with:: Self Patient language and need for interpreter reviewed:: Yes Do you feel safe going back to the place where you live?: No   wants to retur to [facility]  Need for Family Participation in Patient Care: Yes (Comment) Care giver support system in place?: No (comment)   Criminal Activity/Legal Involvement Pertinent to Current Situation/Hospitalization: No - Comment as needed  Activities of Daily Living Home Assistive Devices/Equipment: Wheelchair ADL Screening (condition at time of admission) Patient's cognitive ability adequate to safely complete daily activities?: Yes Is the patient deaf or have difficulty hearing?: No Does the patient have difficulty seeing, even when wearing glasses/contacts?: No Does the patient have difficulty concentrating, remembering, or making decisions?: No Patient able to express need for assistance with ADLs?: Yes Does the  patient have difficulty dressing or bathing?: No Independently performs ADLs?: No Does the patient have difficulty walking or climbing stairs?: Yes Weakness of Legs: Both Weakness of Arms/Hands: None  Permission Sought/Granted   Permission granted to share information with : No              Emotional Assessment Appearance:: Appears stated age Attitude/Demeanor/Rapport: Engaged Affect (typically observed): Accepting Orientation: : Oriented to Self, Oriented to Place, Oriented to  Time, Oriented to Situation Alcohol / Substance Use: Not Applicable Psych Involvement: No (comment)  Admission diagnosis:  S/P debridement [Z98.890] Patient Active Problem List   Diagnosis Date Noted   S/P debridement 10/30/2021   Constipation 09/12/2021   Anemia following surgery 09/12/2021   Bacteremia    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    Endocarditis of mitral valve    Hypokalemia    Sepsis (HCC)    Osteomyelitis (HCC)    Pyogenic arthritis of left hip (HCC)    Septic arthritis of hip ([place]) 09/03/2021   Prolonged Q-T interval on ECG 09/03/2021   Pressure injury of skin of left buttock 05/13/2021   PVCs (premature ventricular contractions)    Atypical chest pain 01/04/2020   Slow transit constipation 03/06/2017   Neurogenic bladder 03/06/2017   Suprapubic catheter ([place]) 03/06/2017   Paraplegia ([place]) 01/29/2017   Pressure injury of skin 01/27/2017   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/24/2017 01/24/2017   Status post debridement 01/24/2017   Hyperlipidemia 03/11/2014   Spina  bifida of lumbar region [facility]) 09/30/2012   Routine adult health maintenance 03/26/2012   ALLERGIC RHINITIS 06/01/2007   Anxiety 03/19/2006   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   VENOUS INSUFFICIENCY, CHRONIC 03/19/2006   Pressure ulcer 03/19/2006   PCP:  Erick Alley, DO Pharmacy:   [id number] - [place] - [address] [place] ([place])  [place] [address] Phone: [phone] Fax: [phone]  [id number] [place], [place] - [address] AT [facility] OF [address] & [address] [facility] [address] Phone: [phone] Fax: [phone]     Social Determinants of Health (SDOH) Interventions Housing Interventions: Intervention Not Indicated  Readmission Risk Interventions     No data to display

## 2021-10-31 NOTE — Anesthesia Postprocedure Evaluation (Signed)
Anesthesia Post Note  Patient: Anthony [name]  Procedure(s) Performed: LEFT HIP DEBRIDEMENT WITH KERECIS PLACEMENT (Left)     Patient location during evaluation: PACU Anesthesia Type: General Level of consciousness: awake and alert Pain management: pain level controlled Vital Signs Assessment: post-procedure vital signs reviewed and stable Respiratory status: spontaneous breathing, nonlabored ventilation, respiratory function stable and patient connected to nasal cannula oxygen Cardiovascular status: blood pressure returned to baseline and stable Postop Assessment: no apparent nausea or vomiting Anesthetic complications: no   No notable events documented.  Last Vitals:  Vitals:   10/31/21 0048 10/31/21 0537  BP: 134/76 117/71  Pulse: (!) 101 92  Resp: 16 16  Temp: 37.4 C 37 C  SpO2: 97% 96%    Last Pain:  Vitals:   10/31/21 0537  TempSrc: Oral  PainSc:                  Angelina

## 2021-10-31 NOTE — TOC Progression Note (Addendum)
Transition of Care [facility]) - Progression Note    Patient Details  Name: Anthony [name] MRN: [id number] Date of Birth: 1971-01-03  Transition of Care [facility]) CM/SW Contact  Reece Agar, [place] Phone Number: 10/31/2021, 12:30 PM  Clinical Narrative:    [name] confirmed that pt came from [facility] for SNF short term rehab. [name] spoke with Kia and confirmed that pt is able to return and has medicare days available.   [name] spoke with pt about DC plan pt is agreeable. [name] will continue to follow until pt is medically stable for DC.    Expected Discharge Plan: [place] Barriers to Discharge: Continued Medical Work up  Expected Discharge Plan and Services Expected Discharge Plan: [place] arrangements for the past 2 months: Apartment Expected Discharge Date: 10/30/21                 DME Agency: NA       HH Arranged: NA           Social Determinants of Health (SDOH) Interventions Housing Interventions: Intervention Not Indicated  Readmission Risk Interventions     No data to display

## 2021-10-31 NOTE — Progress Notes (Signed)
Called Dr. Jess Barters office x 2  and spoke with Sharyn Lull to page and put in order to transfuse PRBC.

## 2021-10-31 NOTE — Progress Notes (Signed)
Patient ID: Anthony [name], male   DOB: 07-10-1970, 51 y.o.   MRN: [id number] Patient is postoperative day 1 revision debridement left hip.  Patient's hemoglobin dropped to 6.7 and patient scheduled for type and cross and transfuse with 1 unit packed red blood cells today.  The wound VAC is functioning well but does not have a good enough seal for the [name] plus wound VAC pump.  We will plan to discharge with dry dressing changes.  Patient was having oozing from his left ischial ulcer that he states has been there for about 10 years.  This was packed open.  Possible discharge tomorrow.

## 2021-11-01 DIAGNOSIS — Z993 Dependence on wheelchair: Secondary | ICD-10-CM | POA: Diagnosis not present

## 2021-11-01 DIAGNOSIS — M009 Pyogenic arthritis, unspecified: Secondary | ICD-10-CM | POA: Diagnosis not present

## 2021-11-01 DIAGNOSIS — Z87891 Personal history of nicotine dependence: Secondary | ICD-10-CM | POA: Diagnosis not present

## 2021-11-01 DIAGNOSIS — Q059 Spina bifida, unspecified: Secondary | ICD-10-CM | POA: Diagnosis not present

## 2021-11-01 DIAGNOSIS — I10 Essential (primary) hypertension: Secondary | ICD-10-CM | POA: Diagnosis not present

## 2021-11-01 DIAGNOSIS — G8220 Paraplegia, unspecified: Secondary | ICD-10-CM | POA: Diagnosis not present

## 2021-11-01 NOTE — Progress Notes (Signed)
Discharge orders placed for patient. D/C PICC orders placed by Dr. Sharol Given however progress note from this morning stated he would be going home with IV abx. Secure chat sent to primary RN Katha Cabal and Dr. Sharol Given inquiring about plan.   Katha Cabal, RN stated that they are awaiting confirmation of rehab placement and will place IVT consult once patient is ready for discharge.   Kischa Altice Lorita Officer, RN

## 2021-11-01 NOTE — TOC Transition Note (Addendum)
Transition of Care [facility]) - CM/SW Discharge Note   Patient Details  Name: Anthony [name] MRN: [id number] Date of Birth: 01-16-1971  Transition of Care [facility]) CM/SW Contact:  Tresa Endo Phone Number: 11/01/2021, 11:20 AM   Clinical Narrative:    Patient will DC to: [place] Anticipated DC date: 11/01/2021 Family notified: Attempted pt Mother Transport by: Corey Harold   Per MD patient ready for DC to [facility]. RN to call report prior to discharge [phone]). RN, patient, patient's family, and facility notified of DC. Discharge Summary and FL2 sent to facility. DC packet on chart. Ambulance transport requested for patient.   [name] will sign off for now as social work intervention is no longer needed. Please consult [place] again if new needs arise.       Barriers to Discharge: Continued Medical Work up   Patient Goals and CMS Choice Patient states their goals for this hospitalization and ongoing recovery are:: to return to [facility]      Discharge Placement                       Discharge Plan and Services                  DME Agency: NA       HH Arranged: NA          Social Determinants of Health (SDOH) Interventions Housing Interventions: Intervention Not Indicated   Readmission Risk Interventions     No data to display

## 2021-11-01 NOTE — Progress Notes (Signed)
Patient ID: Anthony [name], male   DOB: [birth year], 51 y.o.   MRN: [id number] Patient is status post repeat debridement left hip.  There is 150 cc total in the wound VAC canister.  This is 50 cc over the past 24 hours.  The wound VAC has a good suction fit at this time would be able to transition to the [name] plus pump.  Patient has completed his course of antibiotics and will plan to discharge continue the PICC line plan for discharge to home on the portable [name] wound VAC pump.  Discussed that if the pump does leak we will transition to dry dressing changes.  I will follow-up in the office in 1 week.  Discussed that if deep cultures return positive we would proceed with oral antibiotics.

## 2021-11-01 NOTE — Plan of Care (Signed)

## 2021-11-01 NOTE — Progress Notes (Signed)
Patient discharged per order. Utilizing [employer] Lucianne Lei as he riding to [employer] care in his personal wheelchair. Wound vac removed per Dr. Jess Barters instruction. Dressing changed to sacral wound. Suprapubic catheter changed.

## 2021-11-01 NOTE — Progress Notes (Signed)
   10/31/21 2212  Assess: MEWS Score  Temp (!) 101 F (38.3 C)  BP 120/77  MAP (mmHg) 91  Pulse Rate (!) 112  Resp 16  SpO2 93 %  O2 Device Room Air  Assess: MEWS Score  MEWS Temp 1  MEWS Systolic 0  MEWS Pulse 2  MEWS RR 0  MEWS LOC 0  MEWS Score 3  MEWS Score Color Yellow  Assess: if the MEWS score is Yellow or Red  Were vital signs taken at a resting state? Yes  Focused Assessment Change from prior assessment (see assessment flowsheet)  Does the patient meet 2 or more of the SIRS criteria? No  MEWS guidelines implemented *See Row Information* Yes  Treat  MEWS Interventions Administered scheduled meds/treatments  Take Vital Signs  Increase Vital Sign Frequency  Yellow: Q 2hr X 2 then Q 4hr X 2, if remains yellow, continue Q 4hrs  Escalate  MEWS: Escalate Yellow: discuss with charge nurse/RN and consider discussing with provider and RRT  Notify: Charge Nurse/RN  Name of Charge Nurse/RN Notified Olegario Shearer  Date Charge Nurse/RN Notified 10/31/21  Time Charge Nurse/RN Notified 2244  Notify: Provider  Provider Name/Title Tiney Rouge. PA  Date Provider Notified 10/31/21  Time Provider Notified 2229  Method of Notification Page  Notification Reason Other (Comment) (Temp and and pulse elevated)  Provider response See new orders  Date of Provider Response 10/31/21  Time of Provider Response 2245  Document  Patient Outcome Stabilized after interventions  Assess: SIRS CRITERIA  SIRS Temperature  1  SIRS Pulse 1  SIRS Respirations  0  SIRS WBC 1  SIRS Score Sum  3

## 2021-11-01 NOTE — Discharge Summary (Signed)
Discharge Diagnoses:  Principal Problem:   S/P debridement   Surgeries: Procedure(s): LEFT HIP DEBRIDEMENT WITH KERECIS PLACEMENT on 10/30/2021    Consultants:   Discharged Condition: Improved  Hospital Course: Anthony [name] is an 51 y.o. male who was admitted 10/30/2021 with a chief complaint of wound dehiscence left hip, with a final diagnosis of Left Hip Pyogenic Arthritis.  Patient was brought to the operating room on 10/30/2021 and underwent Procedure(s): LEFT HIP DEBRIDEMENT WITH KERECIS PLACEMENT.    Patient was given perioperative antibiotics:  Anti-infectives (From admission, onward)    Start     Dose/Rate Route Frequency Ordered Stop   10/30/21 0800  ceFAZolin (ANCEF) IVPB 2g/100 mL premix        2 g 200 mL/hr over 30 Minutes Intravenous On call to O.R. 10/30/21 0277 10/30/21 1319     .  Patient was given sequential compression devices, early ambulation, and aspirin for DVT prophylaxis.  Recent vital signs: Patient Vitals for the past 24 hrs:  BP Temp Temp src Pulse Resp SpO2  11/01/21 0619 (!) 116/57 98.9 F (37.2 C) Oral (!) 102 18 100 %  11/01/21 0222 102/65 99.5 F (37.5 C) Oral 95 18 98 %  11/01/21 0027 120/63 (!) 100.4 F (38 C) Oral (!) 105 16 94 %  10/31/21 2212 120/77 (!) 101 F (38.3 C) Oral (!) 112 16 93 %  10/31/21 1622 123/78 98.2 F (36.8 C) Oral (!) 110 17 96 %  10/31/21 1229 131/72 99.5 F (37.5 C) Oral (!) 102 16 97 %  10/31/21 0950 128/84 98.8 F (37.1 C) Oral (!) 104 18 96 %  10/31/21 0934 128/78 98.3 F (36.8 C) Oral (!) 102 18 98 %  10/31/21 0803 116/71 99.7 F (37.6 C) Oral (!) 101 17 96 %  .  Recent laboratory studies: No results found.  Discharge Medications:   Allergies as of 11/01/2021       Reactions   Vancomycin Itching        Medication List     STOP taking these medications    ampicillin  IVPB   cefTRIAXone  IVPB Commonly known as: ROCEPHIN   metroNIDAZOLE 500 MG tablet Commonly known as: Flagyl        TAKE these medications    acetaminophen 500 MG tablet Commonly known as: TYLENOL Take 2 tablets (1,000 mg total) by mouth 2 (two) times daily as needed for mild pain, headache or fever.   Chlorhexidine Gluconate Cloth 2 % Pads Apply 6 each topically daily.   cyclobenzaprine 5 MG tablet Commonly known as: FLEXERIL Take 5 mg by mouth See admin instructions. Take 5 mg at bedtime. Take 5 mg every 12 hours as needed for muscle spasms   docusate sodium 100 MG capsule Commonly known as: COLACE Take 1 capsule (100 mg total) by mouth 2 (two) times daily.   feeding supplement (PRO-STAT SUGAR FREE 64) Liqd Take 30 mLs by mouth 3 (three) times daily with meals.   Fish Oil 1000 MG Caps Take 1 capsule by mouth 2 (two) times daily.   flecainide 50 MG tablet Commonly known as: TAMBOCOR Take 1 tablet (50 mg total) by mouth 2 (two) times daily.   Foley Catheter 2-Way Misc Use as directed by urologist.   ibuprofen 600 MG tablet Commonly known as: ADVIL Take 1 tablet (600 mg total) by mouth every 6 (six) hours as needed for fever.   multivitamin with minerals Tabs tablet Take 1 tablet by mouth daily.  oxyCODONE 5 MG immediate release tablet Commonly known as: Oxy IR/ROXICODONE Take 1 tablet (5 mg total) by mouth every 4 (four) hours as needed for moderate pain (Pain score 4-6).   oxyCODONE-acetaminophen 5-325 MG tablet Commonly known as: PERCOCET/ROXICET Take 1 tablet by mouth every 4 (four) hours as needed.   rosuvastatin 40 MG tablet Commonly known as: CRESTOR Take 1 tablet (40 mg total) by mouth daily.        Diagnostic Studies: No results found.  Patient benefited maximally from their hospital stay and there were no complications.     Disposition: Discharge disposition: 62-Rehab Facility      Discharge Instructions     Call MD / Call 911   Complete by: As directed    If you experience chest pain or shortness of breath, CALL 911 and be transported to the  hospital emergency room.  If you develope a fever above 101 F, pus (white drainage) or increased drainage or redness at the wound, or calf pain, call your surgeon's office.   Call MD / Call 911   Complete by: As directed    If you experience chest pain or shortness of breath, CALL 911 and be transported to the hospital emergency room.  If you develope a fever above 101 F, pus (white drainage) or increased drainage or redness at the wound, or calf pain, call your surgeon's office.   Call MD / Call 911   Complete by: As directed    If you experience chest pain or shortness of breath, CALL 911 and be transported to the hospital emergency room.  If you develope a fever above 101 F, pus (white drainage) or increased drainage or redness at the wound, or calf pain, call your surgeon's office.   Constipation Prevention   Complete by: As directed    Drink plenty of fluids.  Prune juice may be helpful.  You may use a stool softener, such as Colace (over the counter) 100 mg twice a day.  Use MiraLax (over the counter) for constipation as needed.   Constipation Prevention   Complete by: As directed    Drink plenty of fluids.  Prune juice may be helpful.  You may use a stool softener, such as Colace (over the counter) 100 mg twice a day.  Use MiraLax (over the counter) for constipation as needed.   Constipation Prevention   Complete by: As directed    Drink plenty of fluids.  Prune juice may be helpful.  You may use a stool softener, such as Colace (over the counter) 100 mg twice a day.  Use MiraLax (over the counter) for constipation as needed.   Diet - low sodium heart healthy   Complete by: As directed    Diet - low sodium heart healthy   Complete by: As directed    Diet - low sodium heart healthy   Complete by: As directed    Increase activity slowly as tolerated   Complete by: As directed    Increase activity slowly as tolerated   Complete by: As directed    Increase activity slowly as tolerated    Complete by: As directed    Negative Pressure Wound Therapy - Incisional   Complete by: As directed    Post-operative opioid taper instructions:   Complete by: As directed    POST-OPERATIVE OPIOID TAPER INSTRUCTIONS: It is important to wean off of your opioid medication as soon as possible. If you do not need pain medication after your surgery  it is ok to stop day one. Opioids include: Codeine, Hydrocodone(Norco, Vicodin), Oxycodone(Percocet, oxycontin) and hydromorphone amongst others.  Long term and even short term use of opiods can cause: Increased pain response Dependence Constipation Depression Respiratory depression And more.  Withdrawal symptoms can include Flu like symptoms Nausea, vomiting And more Techniques to manage these symptoms Hydrate well Eat regular healthy meals Stay active Use relaxation techniques(deep breathing, meditating, yoga) Do Not substitute Alcohol to help with tapering If you have been on opioids for less than two weeks and do not have pain than it is ok to stop all together.  Plan to wean off of opioids This plan should start within one week post op of your joint replacement. Maintain the same interval or time between taking each dose and first decrease the dose.  Cut the total daily intake of opioids by one tablet each day Next start to increase the time between doses. The last dose that should be eliminated is the evening dose.      Post-operative opioid taper instructions:   Complete by: As directed    POST-OPERATIVE OPIOID TAPER INSTRUCTIONS: It is important to wean off of your opioid medication as soon as possible. If you do not need pain medication after your surgery it is ok to stop day one. Opioids include: Codeine, Hydrocodone(Norco, Vicodin), Oxycodone(Percocet, oxycontin) and hydromorphone amongst others.  Long term and even short term use of opiods can cause: Increased pain  response Dependence Constipation Depression Respiratory depression And more.  Withdrawal symptoms can include Flu like symptoms Nausea, vomiting And more Techniques to manage these symptoms Hydrate well Eat regular healthy meals Stay active Use relaxation techniques(deep breathing, meditating, yoga) Do Not substitute Alcohol to help with tapering If you have been on opioids for less than two weeks and do not have pain than it is ok to stop all together.  Plan to wean off of opioids This plan should start within one week post op of your joint replacement. Maintain the same interval or time between taking each dose and first decrease the dose.  Cut the total daily intake of opioids by one tablet each day Next start to increase the time between doses. The last dose that should be eliminated is the evening dose.      Post-operative opioid taper instructions:   Complete by: As directed    POST-OPERATIVE OPIOID TAPER INSTRUCTIONS: It is important to wean off of your opioid medication as soon as possible. If you do not need pain medication after your surgery it is ok to stop day one. Opioids include: Codeine, Hydrocodone(Norco, Vicodin), Oxycodone(Percocet, oxycontin) and hydromorphone amongst others.  Long term and even short term use of opiods can cause: Increased pain response Dependence Constipation Depression Respiratory depression And more.  Withdrawal symptoms can include Flu like symptoms Nausea, vomiting And more Techniques to manage these symptoms Hydrate well Eat regular healthy meals Stay active Use relaxation techniques(deep breathing, meditating, yoga) Do Not substitute Alcohol to help with tapering If you have been on opioids for less than two weeks and do not have pain than it is ok to stop all together.  Plan to wean off of opioids This plan should start within one week post op of your joint replacement. Maintain the same interval or time between taking  each dose and first decrease the dose.  Cut the total daily intake of opioids by one tablet each day Next start to increase the time between doses. The last dose that should be eliminated  is the evening dose.          Follow-up Information     Newt Minion, MD Follow up in 1 week(s).   Specialty: Orthopedic Surgery Contact information: [address] [place] [address] [phone]                  Signed: Newt Minion 11/01/2021, 7:32 AM

## 2021-11-01 NOTE — Progress Notes (Signed)
Placed follow-up call to [facility] to advise about the suprapubic catheter change, the status of the patient's sacral wound, and the removal of the wound vac. Inquired if the facility needed a hard script for pain medication. Felicia stated she had already taken care of obtaining patient's pain medication from internal health resources.

## 2021-11-03 LAB — TYPE AND SCREEN
ABO/RH(D): O POS
Antibody Screen: POSITIVE
Donor AG Type: NEGATIVE
Donor AG Type: NEGATIVE
Unit division: 0
Unit division: 0

## 2021-11-03 LAB — BPAM RBC
Blood Product Expiration Date: 202310292359
Blood Product Expiration Date: 202310292359
ISSUE DATE / TIME: 202310120925
Unit Type and Rh: 5100
Unit Type and Rh: 5100

## 2021-11-04 ENCOUNTER — Other Ambulatory Visit: Payer: Self-pay | Admitting: Orthopedic Surgery

## 2021-11-04 DIAGNOSIS — Q057 Lumbar spina bifida without hydrocephalus: Secondary | ICD-10-CM | POA: Diagnosis not present

## 2021-11-04 DIAGNOSIS — N319 Neuromuscular dysfunction of bladder, unspecified: Secondary | ICD-10-CM | POA: Diagnosis not present

## 2021-11-04 DIAGNOSIS — M6281 Muscle weakness (generalized): Secondary | ICD-10-CM | POA: Diagnosis not present

## 2021-11-04 DIAGNOSIS — M009 Pyogenic arthritis, unspecified: Secondary | ICD-10-CM | POA: Diagnosis not present

## 2021-11-04 DIAGNOSIS — Z792 Long term (current) use of antibiotics: Secondary | ICD-10-CM | POA: Diagnosis not present

## 2021-11-04 DIAGNOSIS — G8220 Paraplegia, unspecified: Secondary | ICD-10-CM | POA: Diagnosis not present

## 2021-11-04 DIAGNOSIS — J309 Allergic rhinitis, unspecified: Secondary | ICD-10-CM | POA: Diagnosis not present

## 2021-11-04 LAB — AEROBIC/ANAEROBIC CULTURE W GRAM STAIN (SURGICAL/DEEP WOUND)

## 2021-11-04 MED ORDER — AMOXICILLIN-POT CLAVULANATE 875-125 MG PO TABS: 1.0000 | ORAL_TABLET | Freq: Two times a day (BID) | ORAL | 0 refills | Status: DC

## 2021-11-05 ENCOUNTER — Ambulatory Visit: Payer: Medicare Other | Admitting: Cardiology

## 2021-11-05 DIAGNOSIS — L89894 Pressure ulcer of other site, stage 4: Secondary | ICD-10-CM | POA: Diagnosis not present

## 2021-11-05 DIAGNOSIS — L89310 Pressure ulcer of right buttock, unstageable: Secondary | ICD-10-CM | POA: Diagnosis not present

## 2021-11-05 DIAGNOSIS — L89324 Pressure ulcer of left buttock, stage 4: Secondary | ICD-10-CM | POA: Diagnosis not present

## 2021-11-05 DIAGNOSIS — L98418 Non-pressure chronic ulcer of buttock with other specified severity: Secondary | ICD-10-CM | POA: Diagnosis not present

## 2021-11-05 NOTE — Progress Notes (Deleted)
Electrophysiology Office Note   Date:  11/05/2021   ID:  Anthony [name], DOB 01-Nov-1970, MRN [id number]  PCP:  Erick Alley, DO  Cardiologist: Mayford Knife Primary Electrophysiologist:  Ronasia Isola Jorja Loa, MD    Chief Complaint: PVCs   History of Present Illness: Anthony [name] is a 51 y.o. male who is being seen today for the evaluation of PVCs at the request of Erick Alley, DO. Presenting today for electrophysiology evaluation.  He has a history of spina bifida and is wheelchair-bound, chronic indwelling catheter.  He was having episodes of chest pain.  He has a remote history of smoking.  CTA showed no PE and no coronary calcium.  He had a stress test which was normal.  He wore a cardiac monitor that showed an 18% burden of PVCs.  He is now on flecainide.  Cardiac MRI showed normal LV function without scar.  Today, denies symptoms of palpitations, chest pain, shortness of breath, orthopnea, PND, lower extremity edema, claudication, dizziness, presyncope, syncope, bleeding, or neurologic sequela. The patient is tolerating medications without difficulties. ***     Past Medical History:  Diagnosis Date   Anxiety    Chronic indwelling Foley catheter    Complication of anesthesia    woken up in surgery before   Dysrhythmia    Endocarditis of mitral valve    Hypertension    Osteomyelitis (HCC)    left hip   Paraplegia (HCC)    secondary to Spina Bifida   PVCs (premature ventricular contractions)    Septic shock (HCC)    due to osteomyelitis   Spina bifida    Wheelchair bound    Past Surgical History:  Procedure Laterality Date   APPLICATION OF WOUND VAC  09/11/2021   Procedure: APPLICATION OF WOUND VAC;  Surgeon: [name], MD;  [facility];  Service: Orthopedics;;   BACK SURGERY     BUBBLE STUDY  09/09/2021   Procedure: BUBBLE STUDY;  Surgeon: Quintella Reichert, MD;  [facility] ENDOSCOPY;  Service: Cardiovascular;;   HIP SURGERY     I & D EXTREMITY Left 09/11/2021    Procedure: LEFT HIP DEBRIDEMENT AND REMOVAL FEMORAL HEAD;  Surgeon: [name], MD;  [facility]: [facility] OR;  Service: Orthopedics;  Laterality: Left;   I & D EXTREMITY Left 09/13/2021   Procedure: DEBRIDEMENT LEFT HIP;  Surgeon: [name], MD;  Location: [facility] OR;  Service: Orthopedics;  Laterality: Left;   I & D EXTREMITY Left 09/18/2021   Procedure: DEBRIDEMENT LEFT HIP, WOUND VAC EXCHANGE;  Surgeon: [name], MD;  Location: [facility] OR;  Service: Orthopedics;  Laterality: Left;   I & D EXTREMITY Left 10/30/2021   Procedure: LEFT HIP DEBRIDEMENT WITH KERECIS PLACEMENT;  Surgeon: [name], MD;  Location: [facility] OR;  Service: Orthopedics;  Laterality: Left;   INCISION AND DRAINAGE OF WOUND N/A 01/24/2017   Procedure: IRRIGATION AND DEBRIDEMENT WOUND- BUTTOCK ABSCESS;  Surgeon: Almond Lint, MD;  Location: [facility] ORS;  Service: General;  Laterality: N/A;   [name] WITHOUT CARDIOVERSION N/A 09/09/2021   Procedure: TRANSESOPHAGEAL ECHOCARDIOGRAM ([name]);  Surgeon: Quintella Reichert, MD;  Location: [facility] ENDOSCOPY;  Service: Cardiovascular;  Laterality: N/A;     Current Outpatient Medications  Medication Sig Dispense Refill   acetaminophen (TYLENOL) 500 MG tablet Take 2 tablets (1,000 mg total) by mouth 2 (two) times daily as needed for mild pain, headache or fever. 30 tablet 0   Amino Acids-Protein Hydrolys (FEEDING SUPPLEMENT, PRO-STAT SUGAR  FREE 64,) LIQD Take 30 mLs by mouth 3 (three) times daily with meals.     amoxicillin-clavulanate (AUGMENTIN) 875-125 MG tablet Take 1 tablet by mouth 2 (two) times daily. 30 tablet 0   Catheters (FOLEY CATHETER 2-WAY) MISC Use as directed by urologist. 5 each 1   Chlorhexidine Gluconate Cloth 2 % PADS Apply 6 each topically daily. (Patient not taking: Reported on 10/23/2021) 6 each 2   cyclobenzaprine (FLEXERIL) 5 MG tablet Take 5 mg by mouth See admin instructions. Take 5 mg at bedtime. Take 5 mg every 12 hours as needed for muscle spasms     docusate sodium (COLACE) 100  MG capsule Take 1 capsule (100 mg total) by mouth 2 (two) times daily. (Patient not taking: Reported on 10/23/2021) 10 capsule 0   flecainide (TAMBOCOR) 50 MG tablet Take 1 tablet (50 mg total) by mouth 2 (two) times daily. 180 tablet 3   ibuprofen (ADVIL) 600 MG tablet Take 1 tablet (600 mg total) by mouth every 6 (six) hours as needed for fever. 30 tablet 0   Multiple Vitamin (MULTIVITAMIN WITH MINERALS) TABS tablet Take 1 tablet by mouth daily.     Omega-3 Fatty Acids (FISH OIL) 1000 MG CAPS Take 1 capsule by mouth 2 (two) times daily.     oxyCODONE (OXY IR/ROXICODONE) 5 MG immediate release tablet Take 1 tablet (5 mg total) by mouth every 4 (four) hours as needed for moderate pain (Pain score 4-6). (Patient not taking: Reported on 10/23/2021) 30 tablet 0   oxyCODONE-acetaminophen (PERCOCET/ROXICET) 5-325 MG tablet Take 1 tablet by mouth every 4 (four) hours as needed. 30 tablet 0   rosuvastatin (CRESTOR) 40 MG tablet Take 1 tablet (40 mg total) by mouth daily. 90 tablet 3   No current facility-administered medications for this visit.    Allergies:   Vancomycin   Social History:  The patient  reports that he has quit smoking. His smoking use included cigarettes. He has never used smokeless tobacco. He reports that he does not drink alcohol.   Family History:  The patient's family history includes CAD in his father and maternal grandfather; Sudden Cardiac Death in his sister.   ROS:  Please see the history of present illness.   Otherwise, review of systems is positive for none.   All other systems are reviewed and negative.   PHYSICAL EXAM: VS:  There were no vitals taken for this visit. , BMI There is no height or weight on file to calculate BMI. GEN: Well nourished, well developed, in no acute distress  HEENT: normal  Neck: no JVD, carotid bruits, or masses Cardiac: ***RRR; no murmurs, rubs, or gallops,no edema  Respiratory:  clear to auscultation bilaterally, normal work of  breathing GI: soft, nontender, nondistended, + BS MS: no deformity or atrophy  Skin: warm and dry Neuro:  Strength and sensation are intact Psych: euthymic mood, full affect  EKG:  EKG {ACTION; IS/IS FEO:[id number]} ordered today. Personal review of the ekg ordered *** shows ***   Recent Labs: 09/07/2021: ALT 40 09/09/2021: Magnesium 1.9 10/30/2021: BUN 5; Creatinine, Ser 0.47; Platelets 356; Potassium 3.3; Sodium 143 10/31/2021: Hemoglobin 8.4    Lipid Panel     Component Value Date/Time   CHOL 143 03/26/2021 1356   TRIG 218 (H) 03/26/2021 1356   HDL 26 (L) 03/26/2021 1356   CHOLHDL 5.5 (H) 03/26/2021 1356   CHOLHDL 7.0 02/06/2014 1449   VLDL 43 (H) 02/06/2014 1449   LDLCALC 80 03/26/2021 1356  Wt Readings from Last 3 Encounters:  10/30/21 168 lb (76.2 kg)  09/11/21 193 lb 5.5 oz (87.7 kg)  07/05/21 190 lb 9.6 oz (86.5 kg)      Other studies Reviewed: Additional studies/ records that were reviewed today include: CMRI 04/29/20  Review of the above records today demonstrates:  1. Normal LV size and wall thickness, with low normal systolic function (EF 91%)   2.  Normal RV size and systolic function (EF 47%)   3.  No late gadolinium enhancement to suggest myocardial scar  04/26/2021 personally reviewed Predominant rhythm was normal sinus rhythm with average heart rate 78 bpm. The heart rate ranged from 48 to 132 bpm Frequent PVCs, ventricular couplets, bigeminal and trigeminal PVCs with PVC load 18% Atrial triplet and nonsustained atrial tachycardia with episode lasting 17 beats with a maximum heart rate of 169 bpm.  ASSESSMENT AND PLAN:  1.  PVCs: Elevated burden at 18%.  Has symptomatic palpitations.  Currently on flecainide 100 mg twice daily.  He does have spina bifida and is in a wheelchair.  He would prefer to avoid procedures.***   Case discussed with primary cardiology  Current medicines are reviewed at length with the patient today.   The patient does  not have concerns regarding his medicines.  The following changes were made today:***   Labs/ tests ordered today include:  No orders of the defined types were placed in this encounter.    Disposition:   FU *** weeks  Signed, Karisma Meiser Meredith Leeds, MD  11/05/2021 2:16 PM     [place] [id number] [phone] (office) [phone] (fax)

## 2021-11-06 ENCOUNTER — Ambulatory Visit: Payer: Medicare Other

## 2021-11-06 ENCOUNTER — Other Ambulatory Visit: Payer: Self-pay | Admitting: *Deleted

## 2021-11-06 ENCOUNTER — Telehealth: Payer: Self-pay | Admitting: Orthopedic Surgery

## 2021-11-06 NOTE — Telephone Encounter (Signed)
Pt is requesting antibiotics , states the facility he is at doesn't know anything about this rx..Rosser

## 2021-11-06 NOTE — Patient Outreach (Signed)
[facility] Post- Acute Care Coordinator follow up. Mr. Kops returned to [facility] from acute hospital.   Update received from [place], [place] indicating Mr. Niehoff is receiving wound care.   Will continue to follow for potential [facility] care coordination services as benefit of insurance plan and PCP.   Marthenia Rolling, MSN, RN,BSN [place] Acute Care Coordinator [phone] (Direct dial)

## 2021-11-06 NOTE — Telephone Encounter (Signed)
Called pt, disconnected.

## 2021-11-06 NOTE — Telephone Encounter (Signed)
I SW wound care nurse at [facility] center and she is informed and sending Rx to their in house pharmacy to fill for him to start taking.

## 2021-11-07 DIAGNOSIS — M25552 Pain in left hip: Secondary | ICD-10-CM | POA: Diagnosis not present

## 2021-11-07 DIAGNOSIS — Q057 Lumbar spina bifida without hydrocephalus: Secondary | ICD-10-CM | POA: Diagnosis not present

## 2021-11-07 DIAGNOSIS — G4700 Insomnia, unspecified: Secondary | ICD-10-CM | POA: Diagnosis not present

## 2021-11-07 DIAGNOSIS — G8220 Paraplegia, unspecified: Secondary | ICD-10-CM | POA: Diagnosis not present

## 2021-11-07 DIAGNOSIS — M62838 Other muscle spasm: Secondary | ICD-10-CM | POA: Diagnosis not present

## 2021-11-07 DIAGNOSIS — M6281 Muscle weakness (generalized): Secondary | ICD-10-CM | POA: Diagnosis not present

## 2021-11-07 DIAGNOSIS — Z79899 Other long term (current) drug therapy: Secondary | ICD-10-CM | POA: Diagnosis not present

## 2021-11-07 DIAGNOSIS — N319 Neuromuscular dysfunction of bladder, unspecified: Secondary | ICD-10-CM | POA: Diagnosis not present

## 2021-11-08 ENCOUNTER — Ambulatory Visit: Payer: Medicare Other | Admitting: Cardiology

## 2021-11-08 ENCOUNTER — Telehealth: Payer: Self-pay

## 2021-11-08 DIAGNOSIS — N319 Neuromuscular dysfunction of bladder, unspecified: Secondary | ICD-10-CM | POA: Diagnosis not present

## 2021-11-08 DIAGNOSIS — G8220 Paraplegia, unspecified: Secondary | ICD-10-CM | POA: Diagnosis not present

## 2021-11-08 DIAGNOSIS — F419 Anxiety disorder, unspecified: Secondary | ICD-10-CM | POA: Diagnosis not present

## 2021-11-08 DIAGNOSIS — I059 Rheumatic mitral valve disease, unspecified: Secondary | ICD-10-CM | POA: Diagnosis not present

## 2021-11-08 DIAGNOSIS — R7881 Bacteremia: Secondary | ICD-10-CM | POA: Diagnosis not present

## 2021-11-08 DIAGNOSIS — I330 Acute and subacute infective endocarditis: Secondary | ICD-10-CM | POA: Diagnosis not present

## 2021-11-08 DIAGNOSIS — J309 Allergic rhinitis, unspecified: Secondary | ICD-10-CM | POA: Diagnosis not present

## 2021-11-08 DIAGNOSIS — M6281 Muscle weakness (generalized): Secondary | ICD-10-CM | POA: Diagnosis not present

## 2021-11-08 DIAGNOSIS — M009 Pyogenic arthritis, unspecified: Secondary | ICD-10-CM | POA: Diagnosis not present

## 2021-11-08 DIAGNOSIS — E785 Hyperlipidemia, unspecified: Secondary | ICD-10-CM | POA: Diagnosis not present

## 2021-11-08 DIAGNOSIS — Q057 Lumbar spina bifida without hydrocephalus: Secondary | ICD-10-CM | POA: Diagnosis not present

## 2021-11-08 DIAGNOSIS — K5900 Constipation, unspecified: Secondary | ICD-10-CM | POA: Diagnosis not present

## 2021-11-08 NOTE — Telephone Encounter (Signed)
[facility] nurse called concerning lab work that was done on patient.  Stated that his WBC was 14.4 and patient is having increased pain at the surgical site, but no infection, no drainage, and no warmness or redness.  Cb# [phone].  Please advise.  Thank you.

## 2021-11-08 NOTE — Telephone Encounter (Signed)
I called and sw nurse pt is s/p Hip debridement 10/30/2021 she states that the incision does not look infected, not draining and no warmth. His WBC is 14.4 but he just started Augmenting yesterday. Moved his appt up to Monday and advised to continue to monitor and if he should get worse over the weekend to sent pt to [facility]. Advised that I would sent to you and if you had any further recommendations that I would call back.

## 2021-11-11 ENCOUNTER — Encounter: Payer: Medicare Other | Admitting: Orthopedic Surgery

## 2021-11-11 DIAGNOSIS — M009 Pyogenic arthritis, unspecified: Secondary | ICD-10-CM | POA: Diagnosis not present

## 2021-11-11 DIAGNOSIS — R42 Dizziness and giddiness: Secondary | ICD-10-CM | POA: Diagnosis not present

## 2021-11-11 DIAGNOSIS — R5381 Other malaise: Secondary | ICD-10-CM | POA: Diagnosis not present

## 2021-11-11 DIAGNOSIS — M6281 Muscle weakness (generalized): Secondary | ICD-10-CM | POA: Diagnosis not present

## 2021-11-13 ENCOUNTER — Other Ambulatory Visit: Payer: Self-pay | Admitting: *Deleted

## 2021-11-13 DIAGNOSIS — L89894 Pressure ulcer of other site, stage 4: Secondary | ICD-10-CM | POA: Diagnosis not present

## 2021-11-13 DIAGNOSIS — L89324 Pressure ulcer of left buttock, stage 4: Secondary | ICD-10-CM | POA: Diagnosis not present

## 2021-11-13 DIAGNOSIS — L98418 Non-pressure chronic ulcer of buttock with other specified severity: Secondary | ICD-10-CM | POA: Diagnosis not present

## 2021-11-13 DIAGNOSIS — L89310 Pressure ulcer of right buttock, unstageable: Secondary | ICD-10-CM | POA: Diagnosis not present

## 2021-11-13 NOTE — Patient Outreach (Signed)
[place] Coordinator follow up. Mr. Leonhard resides in [facility]. Screening for potential [facility] care coordination services as benefit of insurance plan and PCP.   Facility site visit to [facility]. Met with SNF social workers and Marketing executive. Mr. Bolyard being skilled for wound care. Transition plan is to return home. Lives alone.   Went to bedside to speak with Mr. Lowery. However he was off the unit. Left [facility] literature and writer's contact information in room on bedside table.   Will plan outreach to Mr. Brauer.   Will continue to follow.   Marthenia Rolling, MSN, RN,BSN [place] Acute Care Coordinator [phone] (Direct dial)

## 2021-11-14 ENCOUNTER — Ambulatory Visit (INDEPENDENT_AMBULATORY_CARE_PROVIDER_SITE_OTHER): Payer: Medicare Other | Admitting: Orthopedic Surgery

## 2021-11-14 DIAGNOSIS — M00052 Staphylococcal arthritis, left hip: Secondary | ICD-10-CM

## 2021-11-15 ENCOUNTER — Encounter: Payer: Self-pay | Admitting: Orthopedic Surgery

## 2021-11-15 NOTE — Progress Notes (Signed)
Office Visit Note   Patient: Anthony [name]           Date of Birth: July 01, 1970           MRN: [id number] Visit Date: 11/14/2021              Requested by: Erick Alley, DO [address] [place],  [place] [address] PCP: Erick Alley, DO  Chief Complaint  Patient presents with   Left Hip - Routine Post Op    10/30/2021 left hip debridement kerecis graft       HPI: Patient is a 51 year old gentleman who presents after several surgical debridements for left hip pyogenic arthritis.  Most recently status post debridement of the proximal femur and acetabulum with application of Kerecis tissue graft 2 weeks ago.  Assessment & Plan: Visit Diagnoses:  1. Staphylococcal arthritis of left hip (HCC)     Plan: The sutures were removed the wound will be packed open.  Will apply for further application of Kerecis tissue graft within the office.  Follow-Up Instructions: Return in about 2 weeks (around 11/28/2021).   Ortho Exam  Patient is alert, oriented, no adenopathy, well-dressed, normal affect, normal respiratory effort. Examination patient has excellent healing of the majority of the wound.  There is 1 area of the wound open proximally that is 2 cm in length 5 mm in width and probes 4 cm.  His hemoglobin is 8.6 white cell count 14.4 electrolytes normal.  The tissue bed has healthy granulation tissue.  Imaging: No results found. No images are attached to the encounter.  Labs: Lab Results  Component Value Date   REPTSTATUS 11/04/2021 FINAL 10/30/2021   GRAMSTAIN  10/30/2021    RARE WBC PRESENT,BOTH PMN AND MONONUCLEAR NO ORGANISMS SEEN    CULT  10/30/2021    RARE CORYNEBACTERIUM STRIATUM Standardized susceptibility testing for this organism is not available. NO ANAEROBES ISOLATED Performed at [facility], [address] [place], [place] [address]    [facility] ENTEROCOCCUS FAECALIS 09/11/2021     Lab Results  Component Value Date   ALBUMIN <1.5 (L) 09/07/2021    ALBUMIN <1.5 (L) 09/06/2021   ALBUMIN <1.5 (L) 09/04/2021    Lab Results  Component Value Date   MG 1.9 09/09/2021   MG 1.9 09/08/2021   MG 1.8 09/07/2021   No results found for: "VD25OH"  No results found for: "PREALBUMIN"    Latest Ref Rng & Units 10/31/2021    3:45 PM 10/31/2021    8:34 AM 10/30/2021    9:36 AM  CBC EXTENDED  Hemoglobin 13.0 - 17.0 g/dL 8.4  7.8  6.7   HCT 90.3 - 52.0 % 26.3  24.4  21.8      There is no height or weight on file to calculate BMI.  Orders:  No orders of the defined types were placed in this encounter.  No orders of the defined types were placed in this encounter.    Procedures: No procedures performed  Clinical Data: No additional findings.  ROS:  All other systems negative, except as noted in the HPI. Review of Systems  Objective: Vital Signs: There were no vitals taken for this visit.  Specialty Comments:  No specialty comments available.  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    Endocarditis  of mitral valve    Hypokalemia    Sepsis (HCC)    Osteomyelitis (HCC)    Pyogenic arthritis of left hip (HCC)    Septic arthritis of hip (HCC) 09/03/2021   Prolonged Q-T interval on ECG 09/03/2021   Pressure injury of skin of left buttock 05/13/2021   PVCs (premature ventricular contractions)    Atypical chest pain 01/04/2020   Slow transit constipation 03/06/2017   Neurogenic bladder 03/06/2017   Suprapubic catheter (HCC) 03/06/2017   Paraplegia (HCC) 01/29/2017   Pressure injury of skin 01/27/2017   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/24/2017 01/24/2017   Status post debridement 01/24/2017   Hyperlipidemia 03/11/2014   Spina bifida of lumbar region (HCC) 09/30/2012   Routine adult health maintenance 03/26/2012   ALLERGIC RHINITIS  06/01/2007   Anxiety 03/19/2006   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   VENOUS INSUFFICIENCY, CHRONIC 03/19/2006   Pressure ulcer 03/19/2006   Past Medical History:  Diagnosis Date   Anxiety    Chronic indwelling Foley catheter    Complication of anesthesia    woken up in surgery before   Dysrhythmia    Endocarditis of mitral valve    Hypertension    Osteomyelitis (HCC)    left hip   Paraplegia (HCC)    secondary to Spina Bifida   PVCs (premature ventricular contractions)    Septic shock (HCC)    due to osteomyelitis   Spina bifida    Wheelchair bound     Family History  Problem Relation Age of Onset   CAD Father        died age 53 from MI   Sudden Cardiac Death Sister        age 110   CAD Maternal Grandfather        died age 68 MI    Past Surgical History:  Procedure Laterality Date   APPLICATION OF WOUND VAC  09/11/2021   Procedure: APPLICATION OF WOUND VAC;  Surgeon: [name], MD;  Location: [facility] OR;  Service: Orthopedics;;   BACK SURGERY     BUBBLE STUDY  09/09/2021   Procedure: BUBBLE STUDY;  Surgeon: Quintella Reichert, MD;  Location: [facility] ENDOSCOPY;  Service: Cardiovascular;;   HIP SURGERY     I & D EXTREMITY Left 09/11/2021   Procedure: LEFT HIP DEBRIDEMENT AND REMOVAL FEMORAL HEAD;  Surgeon: [name], MD;  Location: [facility] OR;  Service: Orthopedics;  Laterality: Left;   I & D EXTREMITY Left 09/13/2021   Procedure: DEBRIDEMENT LEFT HIP;  Surgeon: [name], MD;  Location: [facility] OR;  Service: Orthopedics;  Laterality: Left;   I & D EXTREMITY Left 09/18/2021   Procedure: DEBRIDEMENT LEFT HIP, WOUND VAC EXCHANGE;  Surgeon: [name], MD;  Location: [facility] OR;  Service: Orthopedics;  Laterality: Left;   I & D EXTREMITY Left 10/30/2021   Procedure: LEFT HIP DEBRIDEMENT WITH KERECIS PLACEMENT;  Surgeon: [name], MD;  Location: [facility] OR;  Service: Orthopedics;  Laterality: Left;   INCISION AND DRAINAGE OF WOUND N/A 01/24/2017   Procedure: IRRIGATION AND DEBRIDEMENT  WOUND- BUTTOCK ABSCESS;  Surgeon: Almond Lint, MD;  Location: [facility] ORS;  Service: General;  Laterality: N/A;   [name] WITHOUT CARDIOVERSION N/A 09/09/2021   Procedure: TRANSESOPHAGEAL ECHOCARDIOGRAM ([name]);  Surgeon: Quintella Reichert, MD;  Location: [facility] ENDOSCOPY;  Service: Cardiovascular;  Laterality: N/A;   Social History   Occupational History   Not on file  Tobacco Use   Smoking status: Former    Types:  Cigarettes   Smokeless tobacco: Never  Vaping Use   Vaping Use: Never used  Substance and Sexual Activity   Alcohol use: No   Drug use: Not on file   Sexual activity: Not on file

## 2021-11-19 DIAGNOSIS — L89894 Pressure ulcer of other site, stage 4: Secondary | ICD-10-CM | POA: Diagnosis not present

## 2021-11-19 DIAGNOSIS — L89310 Pressure ulcer of right buttock, unstageable: Secondary | ICD-10-CM | POA: Diagnosis not present

## 2021-11-19 DIAGNOSIS — L98418 Non-pressure chronic ulcer of buttock with other specified severity: Secondary | ICD-10-CM | POA: Diagnosis not present

## 2021-11-19 DIAGNOSIS — L89324 Pressure ulcer of left buttock, stage 4: Secondary | ICD-10-CM | POA: Diagnosis not present

## 2021-11-20 DIAGNOSIS — R5381 Other malaise: Secondary | ICD-10-CM | POA: Diagnosis not present

## 2021-11-20 DIAGNOSIS — I10 Essential (primary) hypertension: Secondary | ICD-10-CM | POA: Diagnosis not present

## 2021-11-20 DIAGNOSIS — K5900 Constipation, unspecified: Secondary | ICD-10-CM | POA: Diagnosis not present

## 2021-11-20 DIAGNOSIS — R0981 Nasal congestion: Secondary | ICD-10-CM | POA: Diagnosis not present

## 2021-11-20 DIAGNOSIS — R509 Fever, unspecified: Secondary | ICD-10-CM | POA: Diagnosis not present

## 2021-11-22 DIAGNOSIS — R748 Abnormal levels of other serum enzymes: Secondary | ICD-10-CM | POA: Diagnosis not present

## 2021-11-22 DIAGNOSIS — D649 Anemia, unspecified: Secondary | ICD-10-CM | POA: Diagnosis not present

## 2021-11-22 DIAGNOSIS — Q057 Lumbar spina bifida without hydrocephalus: Secondary | ICD-10-CM | POA: Diagnosis not present

## 2021-11-25 DIAGNOSIS — G8929 Other chronic pain: Secondary | ICD-10-CM | POA: Diagnosis not present

## 2021-11-25 DIAGNOSIS — M25552 Pain in left hip: Secondary | ICD-10-CM | POA: Diagnosis not present

## 2021-11-25 DIAGNOSIS — M25551 Pain in right hip: Secondary | ICD-10-CM | POA: Diagnosis not present

## 2021-11-26 DIAGNOSIS — L03115 Cellulitis of right lower limb: Secondary | ICD-10-CM | POA: Diagnosis not present

## 2021-11-26 DIAGNOSIS — R5381 Other malaise: Secondary | ICD-10-CM | POA: Diagnosis not present

## 2021-11-26 DIAGNOSIS — L98418 Non-pressure chronic ulcer of buttock with other specified severity: Secondary | ICD-10-CM | POA: Diagnosis not present

## 2021-11-26 DIAGNOSIS — I059 Rheumatic mitral valve disease, unspecified: Secondary | ICD-10-CM | POA: Diagnosis not present

## 2021-11-26 DIAGNOSIS — L89894 Pressure ulcer of other site, stage 4: Secondary | ICD-10-CM | POA: Diagnosis not present

## 2021-11-26 DIAGNOSIS — L89324 Pressure ulcer of left buttock, stage 4: Secondary | ICD-10-CM | POA: Diagnosis not present

## 2021-11-26 DIAGNOSIS — L89310 Pressure ulcer of right buttock, unstageable: Secondary | ICD-10-CM | POA: Diagnosis not present

## 2021-11-27 DIAGNOSIS — G8929 Other chronic pain: Secondary | ICD-10-CM | POA: Diagnosis not present

## 2021-11-27 DIAGNOSIS — R5381 Other malaise: Secondary | ICD-10-CM | POA: Diagnosis not present

## 2021-11-27 DIAGNOSIS — R509 Fever, unspecified: Secondary | ICD-10-CM | POA: Diagnosis not present

## 2021-11-28 ENCOUNTER — Ambulatory Visit: Payer: Medicare Other | Admitting: Orthopedic Surgery

## 2021-11-28 DIAGNOSIS — L03115 Cellulitis of right lower limb: Secondary | ICD-10-CM | POA: Diagnosis not present

## 2021-11-28 DIAGNOSIS — R5381 Other malaise: Secondary | ICD-10-CM | POA: Diagnosis not present

## 2021-11-28 DIAGNOSIS — U071 COVID-19: Secondary | ICD-10-CM | POA: Diagnosis not present

## 2021-11-29 DIAGNOSIS — R5381 Other malaise: Secondary | ICD-10-CM | POA: Diagnosis not present

## 2021-11-29 DIAGNOSIS — U071 COVID-19: Secondary | ICD-10-CM | POA: Diagnosis not present

## 2021-12-02 DIAGNOSIS — L89894 Pressure ulcer of other site, stage 4: Secondary | ICD-10-CM | POA: Diagnosis not present

## 2021-12-02 DIAGNOSIS — I059 Rheumatic mitral valve disease, unspecified: Secondary | ICD-10-CM | POA: Diagnosis not present

## 2021-12-02 DIAGNOSIS — L98418 Non-pressure chronic ulcer of buttock with other specified severity: Secondary | ICD-10-CM | POA: Diagnosis not present

## 2021-12-02 DIAGNOSIS — L89324 Pressure ulcer of left buttock, stage 4: Secondary | ICD-10-CM | POA: Diagnosis not present

## 2021-12-03 ENCOUNTER — Other Ambulatory Visit: Payer: Self-pay | Admitting: *Deleted

## 2021-12-03 NOTE — Patient Outreach (Signed)
[facility] Post-Acute Care Coordinator follow up. Anthony [name] resides in [facility] SNF. Screening for potential [facility] care coordination services.   Update received from [place], [place] social worker, indicating Anthony [name] recently tested positive for COVID-19.   Will continue to follow.   Raiford Noble, MSN, RN,BSN [facility] Post Acute Care Coordinator [phone] (Direct dial)

## 2021-12-05 DIAGNOSIS — U071 COVID-19: Secondary | ICD-10-CM | POA: Diagnosis not present

## 2021-12-05 DIAGNOSIS — R509 Fever, unspecified: Secondary | ICD-10-CM | POA: Diagnosis not present

## 2021-12-05 DIAGNOSIS — L03115 Cellulitis of right lower limb: Secondary | ICD-10-CM | POA: Diagnosis not present

## 2021-12-09 ENCOUNTER — Ambulatory Visit (INDEPENDENT_AMBULATORY_CARE_PROVIDER_SITE_OTHER): Payer: Medicare Other | Admitting: Orthopedic Surgery

## 2021-12-09 DIAGNOSIS — G8929 Other chronic pain: Secondary | ICD-10-CM | POA: Diagnosis not present

## 2021-12-09 DIAGNOSIS — I059 Rheumatic mitral valve disease, unspecified: Secondary | ICD-10-CM | POA: Diagnosis not present

## 2021-12-09 DIAGNOSIS — L89310 Pressure ulcer of right buttock, unstageable: Secondary | ICD-10-CM | POA: Diagnosis not present

## 2021-12-09 DIAGNOSIS — L89894 Pressure ulcer of other site, stage 4: Secondary | ICD-10-CM | POA: Diagnosis not present

## 2021-12-09 DIAGNOSIS — Z8616 Personal history of COVID-19: Secondary | ICD-10-CM | POA: Diagnosis not present

## 2021-12-09 DIAGNOSIS — M00052 Staphylococcal arthritis, left hip: Secondary | ICD-10-CM

## 2021-12-09 DIAGNOSIS — L98418 Non-pressure chronic ulcer of buttock with other specified severity: Secondary | ICD-10-CM | POA: Diagnosis not present

## 2021-12-09 DIAGNOSIS — L89324 Pressure ulcer of left buttock, stage 4: Secondary | ICD-10-CM | POA: Diagnosis not present

## 2021-12-10 ENCOUNTER — Other Ambulatory Visit: Payer: Self-pay | Admitting: *Deleted

## 2021-12-10 NOTE — Patient Outreach (Signed)
[facility] Post- Acute Care Coordinator follow up. [name] resides in [facility]. Screening for potential [facility] care coordination services as benefit of insurance plan and PCP.   Update received from [place], [place] social worker. Anthony [name] is not open to staying long term. Plans to return home post SNF. Has recovered from COVID. Will exhaust his Medicare SNF days soon.   Will attempt outreach to discuss [facility] services.    Anthony Noble, MSN, RN,BSN [facility] Post Acute Care Coordinator [phone] (Direct dial)

## 2021-12-16 ENCOUNTER — Ambulatory Visit (INDEPENDENT_AMBULATORY_CARE_PROVIDER_SITE_OTHER): Payer: Medicare Other | Admitting: Orthopedic Surgery

## 2021-12-16 DIAGNOSIS — M00052 Staphylococcal arthritis, left hip: Secondary | ICD-10-CM

## 2021-12-17 ENCOUNTER — Other Ambulatory Visit: Payer: Self-pay | Admitting: *Deleted

## 2021-12-17 DIAGNOSIS — L98418 Non-pressure chronic ulcer of buttock with other specified severity: Secondary | ICD-10-CM | POA: Diagnosis not present

## 2021-12-17 DIAGNOSIS — L89894 Pressure ulcer of other site, stage 4: Secondary | ICD-10-CM | POA: Diagnosis not present

## 2021-12-17 DIAGNOSIS — R509 Fever, unspecified: Secondary | ICD-10-CM | POA: Diagnosis not present

## 2021-12-17 DIAGNOSIS — I10 Essential (primary) hypertension: Secondary | ICD-10-CM | POA: Diagnosis not present

## 2021-12-17 DIAGNOSIS — L89324 Pressure ulcer of left buttock, stage 4: Secondary | ICD-10-CM | POA: Diagnosis not present

## 2021-12-17 DIAGNOSIS — G8929 Other chronic pain: Secondary | ICD-10-CM | POA: Diagnosis not present

## 2021-12-17 DIAGNOSIS — I059 Rheumatic mitral valve disease, unspecified: Secondary | ICD-10-CM | POA: Diagnosis not present

## 2021-12-17 NOTE — Patient Outreach (Signed)
[facility] Post- Acute Care Coordinator follow up. Per [facility] Mr. Schoenberger resides in [facility]. Screening for potential [facility] care coordination services as benefit of insurance plan and PCP.   Telephone call made to Mr. Moris [phone] to discuss [facility] follow up. No answer. HIPAA compliant voicemail message left requesting return call.   Will request update from SNF social worker.  Will continue to follow.    Raiford Noble, MSN, RN,BSN [facility] Post Acute Care Coordinator [phone] (Direct dial)

## 2021-12-18 ENCOUNTER — Other Ambulatory Visit: Payer: Self-pay | Admitting: *Deleted

## 2021-12-18 NOTE — Patient Outreach (Signed)
[facility] Post-Acute Care Coordinator follow up. Mr. Swatek resides in [facility]. Screening for potential [facility] care coordination services as benefit of insurance plan and PCP.   Update received from [place], [place] social worker. Mr. Carlini continues with skilled daily wound care. Plan is to return home.  Will continue to follow and plan outreach to discuss [facility] services.   Raiford Noble, MSN, RN,BSN [facility] Post Acute Care Coordinator [phone] (Direct dial)

## 2021-12-20 ENCOUNTER — Telehealth: Payer: Self-pay

## 2021-12-20 NOTE — Telephone Encounter (Signed)
Patient called stating that his fever is going up when he is up all day, but then it goes down at night when he does his second dressing change.  Would like a call back at [phone].  Please advise.  Thank you.

## 2021-12-20 NOTE — Telephone Encounter (Signed)
SW pt, he has been having fevers every day of 100.0 up to 100.3 and has chills. When he lays down for a nap or to rest temp goes down to 99.5. He gets his dressings changed and repacked with gauze twice a day. He states the staff says all drainage looking normal nothing different with it. He reports as soon as staff changes last one of the day his temp goes to 97.0. Awaiting Dr. [name] call back in reference to this.

## 2021-12-20 NOTE — Telephone Encounter (Signed)
Also, he is not on an abx

## 2021-12-22 ENCOUNTER — Encounter: Payer: Self-pay | Admitting: Orthopedic Surgery

## 2021-12-22 NOTE — Progress Notes (Signed)
Office Visit Note   Patient: Anthony [name]           Date of Birth: 1970-06-08           MRN: [id number] Visit Date: 12/16/2021              Requested by: Erick Alley, DO [address] [place],  [place] [address] PCP: Erick Alley, DO  Chief Complaint  Patient presents with   Left Hip - Routine Post Op    10/30/2021 left hip debridement with kerecis graft       HPI: Patient is a 51 year old gentleman who presents 6 weeks status post left hip debridement with application of Kerecis tissue graft.  Patient is currently having the deep wound packed with iodoform gauze.  Patient states he has completed his antibiotics.  Assessment & Plan: Visit Diagnoses:  1. Staphylococcal arthritis of left hip (HCC)     Plan: With the increase drainage recommend packing the wound 2 times a day.  Follow-Up Instructions: Return in about 2 weeks (around 12/30/2021).   Ortho Exam  Patient is alert, oriented, no adenopathy, well-dressed, normal affect, normal respiratory effort. Examination the wound is smaller with 100% healthy granulation tissue there is clear serous drainage.  Patient's packing is saturated with the drainage and patient will need to increase frequency of the dressing change.  Imaging: No results found. No images are attached to the encounter.  Labs: Lab Results  Component Value Date   REPTSTATUS 11/04/2021 FINAL 10/30/2021   GRAMSTAIN  10/30/2021    RARE WBC PRESENT,BOTH PMN AND MONONUCLEAR NO ORGANISMS SEEN    CULT  10/30/2021    RARE CORYNEBACTERIUM STRIATUM Standardized susceptibility testing for this organism is not available. NO ANAEROBES ISOLATED Performed at [facility], [address], [place], [place] [address]    [facility] ENTEROCOCCUS FAECALIS 09/11/2021     Lab Results  Component Value Date   ALBUMIN <1.5 (L) 09/07/2021   ALBUMIN <1.5 (L) 09/06/2021   ALBUMIN <1.5 (L) 09/04/2021    Lab Results  Component Value Date   MG 1.9 09/09/2021    MG 1.9 09/08/2021   MG 1.8 09/07/2021   No results found for: "VD25OH"  No results found for: "PREALBUMIN"    Latest Ref Rng & Units 10/31/2021    3:45 PM 10/31/2021    8:34 AM 10/30/2021    9:36 AM  CBC EXTENDED  Hemoglobin 13.0 - 17.0 g/dL 8.4  7.8  6.7   HCT 09.3 - 52.0 % 26.3  24.4  21.8      There is no height or weight on file to calculate BMI.  Orders:  No orders of the defined types were placed in this encounter.  No orders of the defined types were placed in this encounter.    Procedures: No procedures performed  Clinical Data: No additional findings.  ROS:  All other systems negative, except as noted in the HPI. Review of Systems  Objective: Vital Signs: There were no vitals taken for this visit.  Specialty Comments:  No specialty comments available.  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    Endocarditis of mitral valve    Hypokalemia    Sepsis (HCC)    Osteomyelitis (HCC)    Pyogenic arthritis of left hip (HCC)  Septic arthritis of hip (HCC) 09/03/2021   Prolonged Q-T interval on ECG 09/03/2021   Pressure injury of skin of left buttock 05/13/2021   PVCs (premature ventricular contractions)    Atypical chest pain 01/04/2020   Slow transit constipation 03/06/2017   Neurogenic bladder 03/06/2017   Suprapubic catheter (HCC) 03/06/2017   Paraplegia (HCC) 01/29/2017   Pressure injury of skin 01/27/2017   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/24/2017 01/24/2017   Status post debridement 01/24/2017   Hyperlipidemia 03/11/2014   Spina bifida of lumbar region (HCC) 09/30/2012   Routine adult health maintenance 03/26/2012   ALLERGIC RHINITIS 06/01/2007   Anxiety 03/19/2006   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   VENOUS INSUFFICIENCY, CHRONIC 03/19/2006   Pressure  ulcer 03/19/2006   Past Medical History:  Diagnosis Date   Anxiety    Chronic indwelling Foley catheter    Complication of anesthesia    woken up in surgery before   Dysrhythmia    Endocarditis of mitral valve    Hypertension    Osteomyelitis (HCC)    left hip   Paraplegia (HCC)    secondary to Spina Bifida   PVCs (premature ventricular contractions)    Septic shock (HCC)    due to osteomyelitis   Spina bifida    Wheelchair bound     Family History  Problem Relation Age of Onset   CAD Father        died age 18 from MI   Sudden Cardiac Death Sister        age 77   CAD Maternal Grandfather        died age 62 MI    Past Surgical History:  Procedure Laterality Date   APPLICATION OF WOUND VAC  09/11/2021   Procedure: APPLICATION OF WOUND VAC;  Surgeon: [name], MD;  [facility];  Service: Orthopedics;;   BACK SURGERY     BUBBLE STUDY  09/09/2021   Procedure: BUBBLE STUDY;  Surgeon: Quintella Reichert, MD;  [facility] ENDOSCOPY;  Service: Cardiovascular;;   HIP SURGERY     I & D EXTREMITY Left 09/11/2021   Procedure: LEFT HIP DEBRIDEMENT AND REMOVAL FEMORAL HEAD;  Surgeon: [name], MD;  [facility] OR;  Service: Orthopedics;  Laterality: Left;   I & D EXTREMITY Left 09/13/2021   Procedure: DEBRIDEMENT LEFT HIP;  Surgeon: [name], MD;  Location: [facility] OR;  Service: Orthopedics;  Laterality: Left;   I & D EXTREMITY Left 09/18/2021   Procedure: DEBRIDEMENT LEFT HIP, WOUND VAC EXCHANGE;  Surgeon: [name], MD;  Location: [facility] OR;  Service: Orthopedics;  Laterality: Left;   I & D EXTREMITY Left 10/30/2021   Procedure: LEFT HIP DEBRIDEMENT WITH KERECIS PLACEMENT;  Surgeon: [name], MD;  Location: [facility] OR;  Service: Orthopedics;  Laterality: Left;   INCISION AND DRAINAGE OF WOUND N/A 01/24/2017   Procedure: IRRIGATION AND DEBRIDEMENT WOUND- BUTTOCK ABSCESS;  Surgeon: Almond Lint, MD;  [facility] ORS;  Service: General;  Laterality: N/A;   [name] WITHOUT  CARDIOVERSION N/A 09/09/2021   Procedure: TRANSESOPHAGEAL ECHOCARDIOGRAM ([name]);  Surgeon: Quintella Reichert, MD;  Location: [facility] ENDOSCOPY;  Service: Cardiovascular;  Laterality: N/A;   Social History   Occupational History   Not on file  Tobacco Use   Smoking status: Former    Types: Cigarettes   Smokeless tobacco: Never  Vaping Use   Vaping Use: Never used  Substance and Sexual Activity   Alcohol use: No   Drug use:  Not on file   Sexual activity: Not on file

## 2021-12-23 DIAGNOSIS — I059 Rheumatic mitral valve disease, unspecified: Secondary | ICD-10-CM | POA: Diagnosis not present

## 2021-12-23 DIAGNOSIS — M009 Pyogenic arthritis, unspecified: Secondary | ICD-10-CM | POA: Diagnosis not present

## 2021-12-23 DIAGNOSIS — M25551 Pain in right hip: Secondary | ICD-10-CM | POA: Diagnosis not present

## 2021-12-23 DIAGNOSIS — L98418 Non-pressure chronic ulcer of buttock with other specified severity: Secondary | ICD-10-CM | POA: Diagnosis not present

## 2021-12-23 DIAGNOSIS — R509 Fever, unspecified: Secondary | ICD-10-CM | POA: Diagnosis not present

## 2021-12-23 DIAGNOSIS — L89894 Pressure ulcer of other site, stage 4: Secondary | ICD-10-CM | POA: Diagnosis not present

## 2021-12-23 DIAGNOSIS — L89324 Pressure ulcer of left buttock, stage 4: Secondary | ICD-10-CM | POA: Diagnosis not present

## 2021-12-23 DIAGNOSIS — L89310 Pressure ulcer of right buttock, unstageable: Secondary | ICD-10-CM | POA: Diagnosis not present

## 2021-12-24 ENCOUNTER — Ambulatory Visit (INDEPENDENT_AMBULATORY_CARE_PROVIDER_SITE_OTHER): Payer: Medicare Other

## 2021-12-24 ENCOUNTER — Ambulatory Visit (INDEPENDENT_AMBULATORY_CARE_PROVIDER_SITE_OTHER): Payer: Medicare Other | Admitting: Orthopedic Surgery

## 2021-12-24 DIAGNOSIS — I10 Essential (primary) hypertension: Secondary | ICD-10-CM | POA: Diagnosis not present

## 2021-12-24 DIAGNOSIS — G8929 Other chronic pain: Secondary | ICD-10-CM | POA: Diagnosis not present

## 2021-12-24 DIAGNOSIS — G8220 Paraplegia, unspecified: Secondary | ICD-10-CM | POA: Diagnosis not present

## 2021-12-24 DIAGNOSIS — L89310 Pressure ulcer of right buttock, unstageable: Secondary | ICD-10-CM | POA: Diagnosis not present

## 2021-12-24 DIAGNOSIS — R102 Pelvic and perineal pain: Secondary | ICD-10-CM | POA: Diagnosis not present

## 2021-12-24 DIAGNOSIS — M25551 Pain in right hip: Secondary | ICD-10-CM | POA: Diagnosis not present

## 2021-12-24 DIAGNOSIS — M00052 Staphylococcal arthritis, left hip: Secondary | ICD-10-CM

## 2021-12-24 DIAGNOSIS — M533 Sacrococcygeal disorders, not elsewhere classified: Secondary | ICD-10-CM | POA: Diagnosis not present

## 2021-12-27 DIAGNOSIS — Z978 Presence of other specified devices: Secondary | ICD-10-CM | POA: Diagnosis not present

## 2021-12-27 DIAGNOSIS — N319 Neuromuscular dysfunction of bladder, unspecified: Secondary | ICD-10-CM | POA: Diagnosis not present

## 2021-12-27 DIAGNOSIS — R509 Fever, unspecified: Secondary | ICD-10-CM | POA: Diagnosis not present

## 2021-12-27 DIAGNOSIS — R460 Very low level of personal hygiene: Secondary | ICD-10-CM | POA: Diagnosis not present

## 2021-12-29 ENCOUNTER — Encounter: Payer: Self-pay | Admitting: Orthopedic Surgery

## 2021-12-29 NOTE — Progress Notes (Signed)
Office Visit Note   Patient: Anthony [name]           Date of Birth: 1970-07-09           MRN: [id number] Visit Date: 12/09/2021              Requested by: Erick Alley, DO [address] [place],  [place] [address] PCP: Erick Alley, DO  Chief Complaint  Patient presents with   Left Hip - Routine Post Op    10/30/2021 left hip debridement kerecis graft       HPI: Patient is a 51 year old gentleman who is status post left hip debridement approximately 6 weeks ago.  Patient is currently having the wound packed open with Xeroform gauze.  Assessment & Plan: Visit Diagnoses:  1. Staphylococcal arthritis of left hip (HCC)     Plan: Donated Kerecis micro powder was applied will continue with packing with iodoform gauze.  Follow-Up Instructions: Return in about 1 week (around 12/16/2021).   Ortho Exam  Patient is alert, oriented, no adenopathy, well-dressed, normal affect, normal respiratory effort. Examination patient does have a deep tunneling wound with healthy granulation tissue there is no purulence no cellulitis.  We will apply  donated Kerecis micro powder today packed this with iodoform gauze and then continue with iodoform gauze dressing changes.  Imaging: No results found.    Labs: Lab Results  Component Value Date   REPTSTATUS 11/04/2021 FINAL 10/30/2021   GRAMSTAIN  10/30/2021    RARE WBC PRESENT,BOTH PMN AND MONONUCLEAR NO ORGANISMS SEEN    CULT  10/30/2021    RARE CORYNEBACTERIUM STRIATUM Standardized susceptibility testing for this organism is not available. NO ANAEROBES ISOLATED Performed at [facility], [address], [place], [place] [address]    [facility] ENTEROCOCCUS FAECALIS 09/11/2021     Lab Results  Component Value Date   ALBUMIN <1.5 (L) 09/07/2021   ALBUMIN <1.5 (L) 09/06/2021   ALBUMIN <1.5 (L) 09/04/2021    Lab Results  Component Value Date   MG 1.9 09/09/2021   MG 1.9 09/08/2021   MG 1.8 09/07/2021   No results found  for: "VD25OH"  No results found for: "PREALBUMIN"    Latest Ref Rng & Units 10/31/2021    3:45 PM 10/31/2021    8:34 AM 10/30/2021    9:36 AM  CBC EXTENDED  Hemoglobin 13.0 - 17.0 g/dL 8.4  7.8  6.7   HCT 28.3 - 52.0 % 26.3  24.4  21.8      There is no height or weight on file to calculate BMI.  Orders:  No orders of the defined types were placed in this encounter.  No orders of the defined types were placed in this encounter.    Procedures: No procedures performed  Clinical Data: No additional findings.  ROS:  All other systems negative, except as noted in the HPI. Review of Systems  Objective: Vital Signs: There were no vitals taken for this visit.  Specialty Comments:  No specialty comments available.  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    Endocarditis of mitral valve    Hypokalemia    Sepsis (HCC)    Osteomyelitis (HCC)    Pyogenic arthritis of left hip (HCC)    Septic arthritis of hip (HCC) 09/03/2021  Prolonged Q-T interval on ECG 09/03/2021   Pressure injury of skin of left buttock 05/13/2021   PVCs (premature ventricular contractions)    Atypical chest pain 01/04/2020   Slow transit constipation 03/06/2017   Neurogenic bladder 03/06/2017   Suprapubic catheter (HCC) 03/06/2017   Paraplegia (HCC) 01/29/2017   Pressure injury of skin 01/27/2017   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/24/2017 01/24/2017   Status post debridement 01/24/2017   Hyperlipidemia 03/11/2014   Spina bifida of lumbar region (HCC) 09/30/2012   Routine adult health maintenance 03/26/2012   ALLERGIC RHINITIS 06/01/2007   Anxiety 03/19/2006   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   VENOUS INSUFFICIENCY, CHRONIC 03/19/2006   Pressure ulcer 03/19/2006   Past Medical History:  Diagnosis Date    Anxiety    Chronic indwelling Foley catheter    Complication of anesthesia    woken up in surgery before   Dysrhythmia    Endocarditis of mitral valve    Hypertension    Osteomyelitis (HCC)    left hip   Paraplegia (HCC)    secondary to Spina Bifida   PVCs (premature ventricular contractions)    Septic shock (HCC)    due to osteomyelitis   Spina bifida    Wheelchair bound     Family History  Problem Relation Age of Onset   CAD Father        died age 38 from MI   Sudden Cardiac Death Sister        age 25   CAD Maternal Grandfather        died age 23 MI    Past Surgical History:  Procedure Laterality Date   APPLICATION OF WOUND VAC  09/11/2021   Procedure: APPLICATION OF WOUND VAC;  Surgeon: [name], MD;  [facility];  Service: Orthopedics;;   BACK SURGERY     BUBBLE STUDY  09/09/2021   Procedure: BUBBLE STUDY;  Surgeon: Quintella Reichert, MD;  [facility] ENDOS[facility];  Service: Cardiovascular;;   HIP SURGERY     I & D EXTREMITY Left 09/11/2021   Procedure: LEFT HIP DEBRIDEMENT AND REMOVAL FEMORAL HEAD;  Surgeon: [name], MD;  [facility];  Service: Orthopedics;  Laterality: Left;   I & D EXTREMITY Left 09/13/2021   Procedure: DEBRIDEMENT LEFT HIP;  Surgeon: [name], MD;  Location: [facility] OR;  Service: Orthopedics;  Laterality: Left;   I & D EXTREMITY Left 09/18/2021   Procedure: DEBRIDEMENT LEFT HIP, WOUND VAC EXCHANGE;  Surgeon: [name], MD;  Location: [facility] OR;  Service: Orthopedics;  Laterality: Left;   I & D EXTREMITY Left 10/30/2021   Procedure: LEFT HIP DEBRIDEMENT WITH KERECIS PLACEMENT;  Surgeon: [name], MD;  Location: [facility] OR;  Service: Orthopedics;  Laterality: Left;   INCISION AND DRAINAGE OF WOUND N/A 01/24/2017   Procedure: IRRIGATION AND DEBRIDEMENT WOUND- BUTTOCK ABSCESS;  Surgeon: Almond Lint, MD;  [facility]S;  Service: General;  Laterality: N/A;   [name] WITHOUT CARDIOVERSION N/A 09/09/2021   Procedure: TRANSESOPHAGEAL  ECHOCARDIOGRAM ([name]);  Surgeon: Quintella Reichert, MD;  Location: [facility] ENDOSCOPY;  Service: Cardiovascular;  Laterality: N/A;   Social History   Occupational History   Not on file  Tobacco Use   Smoking status: Former    Types: Cigarettes   Smokeless tobacco: Never  Vaping Use   Vaping Use: Never used  Substance and Sexual Activity   Alcohol use: No   Drug use: Not on file   Sexual activity: Not  on file

## 2021-12-30 ENCOUNTER — Encounter: Payer: Self-pay | Admitting: Orthopedic Surgery

## 2021-12-30 ENCOUNTER — Other Ambulatory Visit: Payer: Self-pay | Admitting: *Deleted

## 2021-12-30 DIAGNOSIS — I10 Essential (primary) hypertension: Secondary | ICD-10-CM | POA: Diagnosis not present

## 2021-12-30 NOTE — Patient Outreach (Signed)
[facility] Post- Acute Care Coordinator follow up. Mr. Pollack resides in [facility]. Screening for potential [facility] care coordination services as benefit of insurance plan and PCP.   Telephone call made to Mr. Tennis at [phone] to discuss [facility] follow up. No answer. HIPAA compliant voicemail message left to request return call.  Will continue to follow.   Raiford Noble, MSN, RN,BSN [facility] Post Acute Care Coordinator [phone] (Direct dial)

## 2021-12-30 NOTE — Progress Notes (Signed)
Office Visit Note   Patient: Anthony [name]           Date of Birth: [birth year]           MRN: [id number] Visit Date: 12/24/2021              Requested by: Erick Alley, DO [address] [place],  [place] [address] PCP: Erick Alley, DO  Chief Complaint  Patient presents with   Left Hip - Routine Post Op    10/30/2021 left hip debridement with kerecis graft       HPI: Patient is a 51 year old gentleman status post proximal left femur resection and application of Kerecis tissue graft.  Patient has been undergoing packing of the wound with iodoform gauze twice a day.  Patient states that recently he has been having fevers.  Patient was advised to go to the emergency room and patient states he received a prescription for antibiotics from [facility] and he has used this over the weekend.  Patient states his temperature has been up to 99.  Assessment & Plan: Visit Diagnoses:  1. Staphylococcal arthritis of left hip (HCC)     Plan: Recommended proceeding with repeat debridement and further bone resection.  Patient states he would like to wait several months.  Also discussed the possibility of a hip disarticulation.  Follow-Up Instructions: Return in about 1 week (around 12/31/2021).   Ortho Exam  Patient is alert, oriented, no adenopathy, well-dressed, normal affect, normal respiratory effort. Examination the ulcer laterally over the hip is 1 cm diameter and 7 cm deep there is clear serous fluid there is no surrounding cellulitis.  Patient also has a chronic indwelling catheter and the urine is clear.  Imaging: No results found.    Labs: Lab Results  Component Value Date   REPTSTATUS 11/04/2021 FINAL 10/30/2021   GRAMSTAIN  10/30/2021    RARE WBC PRESENT,BOTH PMN AND MONONUCLEAR NO ORGANISMS SEEN    CULT  10/30/2021    RARE CORYNEBACTERIUM STRIATUM Standardized susceptibility testing for this organism is not available. NO ANAEROBES ISOLATED Performed at [facility], [address], [place], [place] [address]    [facility] ENTEROCOCCUS FAECALIS 09/11/2021     Lab Results  Component Value Date   ALBUMIN <1.5 (L) 09/07/2021   ALBUMIN <1.5 (L) 09/06/2021   ALBUMIN <1.5 (L) 09/04/2021    Lab Results  Component Value Date   MG 1.9 09/09/2021   MG 1.9 09/08/2021   MG 1.8 09/07/2021   No results found for: "VD25OH"  No results found for: "PREALBUMIN"    Latest Ref Rng & Units 10/31/2021    3:45 PM 10/31/2021    8:34 AM 10/30/2021    9:36 AM  CBC EXTENDED  Hemoglobin 13.0 - 17.0 g/dL 8.4  7.8  6.7   HCT 51.7 - 52.0 % 26.3  24.4  21.8      There is no height or weight on file to calculate BMI.  Orders:  Orders Placed This Encounter  Procedures   XR HIP UNILAT W OR W/O PELVIS 2-3 VIEWS LEFT   No orders of the defined types were placed in this encounter.    Procedures: No procedures performed  Clinical Data: No additional findings.  ROS:  All other systems negative, except as noted in the HPI. Review of Systems  Objective: Vital Signs: There were no vitals taken for this visit.  Specialty Comments:  No specialty comments available.  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    Endocarditis of mitral valve    Hypokalemia    Sepsis (HCC)    Osteomyelitis (HCC)    Pyogenic arthritis of left hip (HCC)    Septic arthritis of hip (HCC) 09/03/2021   Prolonged Q-T interval on ECG 09/03/2021   Pressure injury of skin of left buttock 05/13/2021   PVCs (premature ventricular contractions)    Atypical chest pain 01/04/2020   Slow transit constipation 03/06/2017   Neurogenic bladder 03/06/2017   Suprapubic catheter ([place]) 03/06/2017   Paraplegia ([place]) 01/29/2017   Pressure injury of skin 01/27/2017   Ischiorectal abscess s/p I&D  01/24/2017 01/24/2017   Status post debridement 01/24/2017   Hyperlipidemia 03/11/2014   Spina bifida of lumbar region ([place]) 09/30/2012   Routine adult health maintenance 03/26/2012   ALLERGIC RHINITIS 06/01/2007   Anxiety 03/19/2006   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   VENOUS INSUFFICIENCY, CHRONIC 03/19/2006   Pressure ulcer 03/19/2006   Past Medical History:  Diagnosis Date   Anxiety    Chronic indwelling Foley catheter    Complication of anesthesia    woken up in surgery before   Dysrhythmia    Endocarditis of mitral valve    Hypertension    Osteomyelitis ([place])    left hip   Paraplegia ([place])    secondary to Spina Bifida   PVCs (premature ventricular contractions)    Septic shock ([place])    due to osteomyelitis   Spina bifida    Wheelchair bound     Family History  Problem Relation Age of Onset   CAD Father        died age 36 from MI   Sudden Cardiac Death Sister        age 47   CAD Maternal Grandfather        died age 13 MI    Past Surgical History:  Procedure Laterality Date   APPLICATION OF WOUND VAC  09/11/2021   Procedure: APPLICATION OF WOUND VAC;  Surgeon: Newt Minion, MD;  Location: [place];  Service: Orthopedics;;   BACK SURGERY     BUBBLE STUDY  09/09/2021   Procedure: BUBBLE STUDY;  Surgeon: Sueanne Margarita, MD;  Location: [place] ENDOSCOPY;  Service: Cardiovascular;;   HIP SURGERY     I & D EXTREMITY Left 09/11/2021   Procedure: LEFT HIP DEBRIDEMENT AND REMOVAL FEMORAL HEAD;  Surgeon: Newt Minion, MD;  Location: [place];  Service: Orthopedics;  Laterality: Left;   I & D EXTREMITY Left 09/13/2021   Procedure: DEBRIDEMENT LEFT HIP;  Surgeon: Newt Minion, MD;  Location: [place];  Service: Orthopedics;  Laterality: Left;   I & D EXTREMITY Left 09/18/2021   Procedure: DEBRIDEMENT LEFT HIP, WOUND VAC EXCHANGE;  Surgeon: Newt Minion, MD;  Location: [place];  Service: Orthopedics;  Laterality: Left;   I & D EXTREMITY Left 10/30/2021   Procedure: LEFT HIP DEBRIDEMENT  WITH KERECIS PLACEMENT;  Surgeon: Newt Minion, MD;  Location: [place];  Service: Orthopedics;  Laterality: Left;   INCISION AND DRAINAGE OF WOUND N/A 01/24/2017   Procedure: IRRIGATION AND DEBRIDEMENT WOUND- BUTTOCK ABSCESS;  Surgeon: Stark Klein, MD;  [facility]: [facility] ORS;  Service: General;  Laterality: N/A;   [name] WITHOUT CARDIOVERSION N/A 09/09/2021   Procedure: TRANSESOPHAGEAL ECHOCARDIOGRAM ([name]);  Surgeon: Radford Pax,  Eber Hong, MD;  Location: [place] ENDOSCOPY;  Service: Cardiovascular;  Laterality: N/A;   Social History   Occupational History   Not on file  Tobacco Use   Smoking status: Former    Types: Cigarettes   Smokeless tobacco: Never  Vaping Use   Vaping Use: Never used  Substance and Sexual Activity   Alcohol use: No   Drug use: Not on file   Sexual activity: Not on file

## 2021-12-30 NOTE — Patient Outreach (Signed)
[facility] Post-Acute Care Coordinator follow up. Mr. Giroux resides in [facility].   Returned telephone call received from Mr. Rivest. Explained [facility] care coordination services. Mr. Chien endorses he will return home on Saturday, Dec. 16th. He is agreeable to [facility] follow up. Lives alone. Mrs. Damian states he has managed alone for some time now.  Utilizes [employer] for transportation services. Interested in mobile meals. Mr. Caggiano states he does not have Medicaid currently. States he has applied for it however, and status is pending.   Mr. Gasparini confirms best contact number is [phone].   Mr. Enis has history paraplegia, spina bifida,  neurogenic bladder, chronic wounds, recent COVID, HTN, osteomyelitis.  Will plan to make referral closer to dc to [facility] care coordination team for RN, SW, and community resources for mobile meals.   Will follow.   Raiford Noble, MSN, RN,BSN [facility] Post Acute Care Coordinator [phone] (Direct dial)

## 2021-12-31 DIAGNOSIS — L89894 Pressure ulcer of other site, stage 4: Secondary | ICD-10-CM | POA: Diagnosis not present

## 2021-12-31 DIAGNOSIS — Z978 Presence of other specified devices: Secondary | ICD-10-CM | POA: Diagnosis not present

## 2021-12-31 DIAGNOSIS — L98418 Non-pressure chronic ulcer of buttock with other specified severity: Secondary | ICD-10-CM | POA: Diagnosis not present

## 2021-12-31 DIAGNOSIS — N319 Neuromuscular dysfunction of bladder, unspecified: Secondary | ICD-10-CM | POA: Diagnosis not present

## 2021-12-31 DIAGNOSIS — L89324 Pressure ulcer of left buttock, stage 4: Secondary | ICD-10-CM | POA: Diagnosis not present

## 2021-12-31 DIAGNOSIS — I059 Rheumatic mitral valve disease, unspecified: Secondary | ICD-10-CM | POA: Diagnosis not present

## 2022-01-02 DIAGNOSIS — G8220 Paraplegia, unspecified: Secondary | ICD-10-CM | POA: Diagnosis not present

## 2022-01-02 DIAGNOSIS — G8929 Other chronic pain: Secondary | ICD-10-CM | POA: Diagnosis not present

## 2022-01-02 DIAGNOSIS — I10 Essential (primary) hypertension: Secondary | ICD-10-CM | POA: Diagnosis not present

## 2022-01-02 DIAGNOSIS — N319 Neuromuscular dysfunction of bladder, unspecified: Secondary | ICD-10-CM | POA: Diagnosis not present

## 2022-01-02 DIAGNOSIS — M009 Pyogenic arthritis, unspecified: Secondary | ICD-10-CM | POA: Diagnosis not present

## 2022-01-02 DIAGNOSIS — L03115 Cellulitis of right lower limb: Secondary | ICD-10-CM | POA: Diagnosis not present

## 2022-01-03 ENCOUNTER — Other Ambulatory Visit: Payer: Self-pay | Admitting: *Deleted

## 2022-01-03 DIAGNOSIS — G8220 Paraplegia, unspecified: Secondary | ICD-10-CM

## 2022-01-03 NOTE — Patient Outreach (Signed)
[facility] Post-Acute Care Coordinator follow up. Mr. Batson resides in [facility].   Spoke with [facility] social workers. Mr. Chui is slated to discharge home on Saturday, 01/04/22. He will have [facility]. PCP appointment is scheduled for 01/09/22 at 11:10 am.   See writer's notes on 12/30/21 detailing conversation with Mr. Tammen who is agreeable to [facility] care coordination follow up. Mr. Beville lives alone. He did not want to remain for LTC. He has utilized 100 SNF days.   Will make referral to [facility] care coordination team for SW for follow up on pending Medicaid application and [facility] RNCM for care coordination and [facility] care guide for mobile meals. Mr. Grumbine reports he uses [facility] for transportation.   Mr. Mikesell has medical history of paraplegia, neurogenic bladder with suprapubic catheter, chronic sacral wounds, chronic hip wound, recent COVID, HTN, spina bifida, osteomyelitis.  Raiford Noble, MSN, RN,BSN [facility] Post Acute Care Coordinator [phone] (Direct dial)

## 2022-01-05 DIAGNOSIS — G8220 Paraplegia, unspecified: Secondary | ICD-10-CM | POA: Diagnosis not present

## 2022-01-05 DIAGNOSIS — E43 Unspecified severe protein-calorie malnutrition: Secondary | ICD-10-CM | POA: Diagnosis not present

## 2022-01-05 DIAGNOSIS — F419 Anxiety disorder, unspecified: Secondary | ICD-10-CM | POA: Diagnosis not present

## 2022-01-05 DIAGNOSIS — I4581 Long QT syndrome: Secondary | ICD-10-CM | POA: Diagnosis not present

## 2022-01-05 DIAGNOSIS — I872 Venous insufficiency (chronic) (peripheral): Secondary | ICD-10-CM | POA: Diagnosis not present

## 2022-01-05 DIAGNOSIS — Q651 Congenital dislocation of hip, bilateral: Secondary | ICD-10-CM | POA: Diagnosis not present

## 2022-01-05 DIAGNOSIS — N319 Neuromuscular dysfunction of bladder, unspecified: Secondary | ICD-10-CM | POA: Diagnosis not present

## 2022-01-05 DIAGNOSIS — M00852 Arthritis due to other bacteria, left hip: Secondary | ICD-10-CM | POA: Diagnosis not present

## 2022-01-05 DIAGNOSIS — D638 Anemia in other chronic diseases classified elsewhere: Secondary | ICD-10-CM | POA: Diagnosis not present

## 2022-01-05 DIAGNOSIS — I059 Rheumatic mitral valve disease, unspecified: Secondary | ICD-10-CM | POA: Diagnosis not present

## 2022-01-05 DIAGNOSIS — Z945 Skin transplant status: Secondary | ICD-10-CM | POA: Diagnosis not present

## 2022-01-05 DIAGNOSIS — M162 Bilateral osteoarthritis resulting from hip dysplasia: Secondary | ICD-10-CM | POA: Diagnosis not present

## 2022-01-05 DIAGNOSIS — L89214 Pressure ulcer of right hip, stage 4: Secondary | ICD-10-CM | POA: Diagnosis not present

## 2022-01-05 DIAGNOSIS — Q059 Spina bifida, unspecified: Secondary | ICD-10-CM | POA: Diagnosis not present

## 2022-01-05 DIAGNOSIS — M86452 Chronic osteomyelitis with draining sinus, left femur: Secondary | ICD-10-CM | POA: Diagnosis not present

## 2022-01-05 DIAGNOSIS — E785 Hyperlipidemia, unspecified: Secondary | ICD-10-CM | POA: Diagnosis not present

## 2022-01-05 DIAGNOSIS — I10 Essential (primary) hypertension: Secondary | ICD-10-CM | POA: Diagnosis not present

## 2022-01-05 DIAGNOSIS — T8131XD Disruption of external operation (surgical) wound, not elsewhere classified, subsequent encounter: Secondary | ICD-10-CM | POA: Diagnosis not present

## 2022-01-05 DIAGNOSIS — N360 Urethral fistula: Secondary | ICD-10-CM | POA: Diagnosis not present

## 2022-01-05 DIAGNOSIS — L89894 Pressure ulcer of other site, stage 4: Secondary | ICD-10-CM | POA: Diagnosis not present

## 2022-01-05 DIAGNOSIS — M71052 Abscess of bursa, left hip: Secondary | ICD-10-CM | POA: Diagnosis not present

## 2022-01-05 DIAGNOSIS — K5901 Slow transit constipation: Secondary | ICD-10-CM | POA: Diagnosis not present

## 2022-01-05 DIAGNOSIS — I330 Acute and subacute infective endocarditis: Secondary | ICD-10-CM | POA: Diagnosis not present

## 2022-01-05 DIAGNOSIS — I493 Ventricular premature depolarization: Secondary | ICD-10-CM | POA: Diagnosis not present

## 2022-01-05 DIAGNOSIS — Z9359 Other cystostomy status: Secondary | ICD-10-CM | POA: Diagnosis not present

## 2022-01-06 ENCOUNTER — Telehealth: Payer: Self-pay

## 2022-01-06 ENCOUNTER — Telehealth: Payer: Self-pay | Admitting: Cardiology

## 2022-01-06 ENCOUNTER — Telehealth: Payer: Self-pay | Admitting: *Deleted

## 2022-01-06 NOTE — Telephone Encounter (Signed)
Called pt in regards to medications.  Pt reports was discharged from [facility] on Saturday and wants to know if he should take metoprolol, lisinopril, rosuvastatin and flecainide.  Advised pt at discharge from facility should have received a discharge summary with a list of medications to take. Pt reports was not given this information.  Advised pt to call  facility to obtain this information.  As per pt was in the facility for 3-4 months.  Our office has no record of BP readings and what medications he was taking.  Pt does not have any recent BP readings reports home BP cuff isn't accurate.  Again advised pt that we can't order medications that affect BP without data to support the need for medication.   Pt reports staff at facility were stupid and didn't know what they were doing.   Advised pt I will call the facility to see if they can fax over discharge summary. Spoke with nurse Stephan Minister who reports pt was given actual prescriptions and a discharge summary with medication and wound care instructions.  Pt signed that he was provided with this information before discharge.  Asked to have summary faxed over to our office was told by [facility] that she can not fax over needed  information.    Called pt advised of the above information.  He request to have an OV with Dr.Turner.  Advised pt to call back in for scheduling to set up OV.    Will send to Dr. Mayford Knife for advisement.

## 2022-01-06 NOTE — Telephone Encounter (Signed)
Patient states he forgot to mention rosuvastatin as well.

## 2022-01-06 NOTE — Telephone Encounter (Signed)
   Telephone encounter was:  Successful.  01/06/2022 Name: Anthony [name] MRN: [id number] DOB: 1970-10-03  MADDEN GARRON is a 51 y.o. year old male who is a primary care patient of Erick Alley, DO . The community resource team was consulted for assistance with Food Insecurity  Care guide performed the following interventions: Spoke with patient to inform him he has been approved to receive 4 weeks of Mom's Meals and a food pantry list will be mailed to his home address.   Follow Up Plan:  No further follow up planned at this time. The patient has been provided with needed resources.  Yanique Mulvihill [facility]  [facility] Population Health Community Resource Care Guide   [email or web address]  ?? [phone]   Website: [email or web address]

## 2022-01-06 NOTE — Telephone Encounter (Signed)
   Telephone encounter was:  Successful.  01/06/2022 Name: JAVONI LUCKEN MRN: [id number] DOB: [birth year]  NOLON YELLIN is a 51 y.o. year old male who is a primary care patient of Erick Alley, DO . The community resource team was consulted for assistance with Transportation Needs  and Food Insecurity  Care guide performed the following interventions: Spoke to patient about transportation scheduled with [facility] for 01/09/2022. Gave patient the reservation number for [facility]. Verified patient's home address to mail [employer].  Also sending a Mom's Meals referral form to [facility].   Follow Up Plan:   I will follow up with the patient regarding Mom's Meals.  Deveron Shamoon [facility]  [facility] Population Health Community Resource Care Guide   [email or web address]  ?? [phone]   Website: [email or web address]  [email or web address]

## 2022-01-06 NOTE — Telephone Encounter (Signed)
   Telephone encounter was:  Successful.  01/06/2022 Name: METE PURDUM MRN: [id number] DOB: 11/12/1970  Anthony [name] is a 51 y.o. year old male who is a primary care patient of Erick Alley, DO . The community resource team was consulted for assistance with Transportation Needs   Care guide performed the following interventions: Spoke to Merline at [facility] patient's transportation for 01/09/22 is scheduled. Pick up time is 9:15-9:45am. Pickup time from appointment home is 12:10-12:40pm.  Darleene Cleaver is $2.50 each way. Reseration line [phone]. Left message for patient to return my call regarding scheduled transportation for 01/09/2022.  Follow Up Plan:   I will attempt to call patient again to inform him of his scheduled ride with [facility] 01/09/2022.  Lariah Fleer [facility]  [facility] Population Health Community Resource Care Guide   [email or web address]  ?? [phone]   Website: [email or web address]  [email or web address]

## 2022-01-06 NOTE — Telephone Encounter (Signed)
Pt c/o medication issue:  1. Name of Medication: flecainide (TAMBOCOR) 50 MG tablet  Lisinopril  Metoprolol   2. How are you currently taking this medication (dosage and times per day)? Not currently taking   3. Are you having a reaction (difficulty breathing--STAT)?   4. What is your medication issue? Pt states that hospital took him off of these meds and he would like to know if he should continue these medications. Please advise.

## 2022-01-06 NOTE — Progress Notes (Signed)
  Care Coordination   Note   01/06/2022 Name: Anthony [name] MRN: [id number] DOB: December 05, 1970  Anthony [name] is a 51 y.o. year old male who sees Erick Alley, DO for primary care. I reached out to [name] by phone today to offer care coordination services.  Mr. Stanco was given information about Care Coordination services today including:   The Care Coordination services include support from the care team which includes your Nurse Coordinator, Clinical Social Worker, or Pharmacist.  The Care Coordination team is here to help remove barriers to the health concerns and goals most important to you. Care Coordination services are voluntary, and the patient may decline or stop services at any time by request to their care team member.   Care Coordination Consent Status: Patient agreed to services and verbal consent obtained.   Follow up plan:  Telephone appointment with care coordination team member scheduled for:  01/10/22 with RNCM and 12/26 with SW   Encounter Outcome:  Pt. Scheduled  [facility] Coordination Care Guide  Direct Dial: [phone]

## 2022-01-07 NOTE — Progress Notes (Deleted)
    SUBJECTIVE:   CHIEF COMPLAINT / HPI:   Staphylococcal arthritis of L hip Pt follows with orthopedist, Dr. [name]. Was hospitalized from 10/30/21 - 10/13 and had L hip debridment with Kerecis placement. Plan is currently to do repeat debridement and further bone resection in several months. *** next apt with Dr. [name]  PERTINENT  PMH / PSH: ***  OBJECTIVE:   There were no vitals taken for this visit. ***  General: NAD, pleasant, able to participate in exam Cardiac: RRR, no murmurs. Respiratory: CTAB, normal effort, No wheezes, rales or rhonchi Abdomen: Bowel sounds present, nontender, nondistended, no hepatosplenomegaly. Extremities: no edema or cyanosis. Skin: warm and dry, no rashes noted Neuro: alert, no obvious focal deficits Psych: Normal affect and mood  ASSESSMENT/PLAN:   No problem-specific Assessment & Plan notes found for this encounter.     Dr. Erick Alley, DO [facility]    {    This will disappear when note is signed, click to select method of visit    :1}

## 2022-01-08 DIAGNOSIS — N360 Urethral fistula: Secondary | ICD-10-CM | POA: Diagnosis not present

## 2022-01-08 DIAGNOSIS — L89214 Pressure ulcer of right hip, stage 4: Secondary | ICD-10-CM | POA: Diagnosis not present

## 2022-01-08 DIAGNOSIS — M71052 Abscess of bursa, left hip: Secondary | ICD-10-CM | POA: Diagnosis not present

## 2022-01-08 DIAGNOSIS — L89894 Pressure ulcer of other site, stage 4: Secondary | ICD-10-CM | POA: Diagnosis not present

## 2022-01-08 DIAGNOSIS — Z945 Skin transplant status: Secondary | ICD-10-CM | POA: Diagnosis not present

## 2022-01-08 DIAGNOSIS — T8131XD Disruption of external operation (surgical) wound, not elsewhere classified, subsequent encounter: Secondary | ICD-10-CM | POA: Diagnosis not present

## 2022-01-09 ENCOUNTER — Ambulatory Visit: Payer: Medicare Other | Admitting: Student

## 2022-01-10 ENCOUNTER — Encounter: Payer: Self-pay | Admitting: Physician Assistant

## 2022-01-10 ENCOUNTER — Ambulatory Visit: Payer: Self-pay

## 2022-01-10 ENCOUNTER — Ambulatory Visit: Payer: Medicare Other | Attending: Physician Assistant | Admitting: Physician Assistant

## 2022-01-10 VITALS — BP 160/82 | HR 100 | Ht 68.0 in | Wt 180.0 lb

## 2022-01-10 DIAGNOSIS — E785 Hyperlipidemia, unspecified: Secondary | ICD-10-CM | POA: Diagnosis not present

## 2022-01-10 DIAGNOSIS — I493 Ventricular premature depolarization: Secondary | ICD-10-CM | POA: Insufficient documentation

## 2022-01-10 DIAGNOSIS — I10 Essential (primary) hypertension: Secondary | ICD-10-CM | POA: Diagnosis not present

## 2022-01-10 DIAGNOSIS — R079 Chest pain, unspecified: Secondary | ICD-10-CM | POA: Diagnosis not present

## 2022-01-10 DIAGNOSIS — I4729 Other ventricular tachycardia: Secondary | ICD-10-CM

## 2022-01-10 MED ORDER — METOPROLOL SUCCINATE ER 50 MG PO TB24: 50.0000 mg | ORAL_TABLET | Freq: Two times a day (BID) | ORAL | 3 refills | Status: DC

## 2022-01-10 MED ORDER — LISINOPRIL-HYDROCHLOROTHIAZIDE 20-25 MG PO TABS: 1.0000 | ORAL_TABLET | Freq: Every day | ORAL | 3 refills | Status: DC

## 2022-01-10 MED ORDER — NITROGLYCERIN 0.4 MG SL SUBL: 0.4000 mg | SUBLINGUAL_TABLET | SUBLINGUAL | 5 refills | Status: AC | PRN

## 2022-01-10 NOTE — Patient Instructions (Signed)
Medication Instructions:  1.Start metoprolol succinate (Toprol XL) 50 mg twice a day 2.Start lisinopril-hydrochlorothiazide 20-25 mg daily 3.Start sublingual nitroglycerin 0.4 mg, place one tablet under the tongue every 5 minutes as needed for chest pain, max 3 doses, if no relief call 911 *If you need a refill on your cardiac medications before your next appointment, please call your pharmacy*   Lab Work: Fasting lipid and cbc next week If you have labs (blood work) drawn today and your tests are completely normal, you will receive your results only by: MyChart Message (if you have MyChart) OR A paper copy in the mail If you have any lab test that is abnormal or we need to change your treatment, we will call you to review the results.   Follow-Up: At [facility], you and your health needs are our priority.  As part of our continuing mission to provide you with exceptional heart care, we have created designated Provider Care Teams.  These Care Teams include your primary Cardiologist (physician) and Advanced Practice Providers (APPs -  Physician Assistants and Nurse Practitioners) who all work together to provide you with the care you need, when you need it.  Your next appointment:   01/31/2022 at 3:20 PM  The format for your next appointment:   In Person  Provider:   [name], MD         Other Instructions Check your blood pressure and heart rate daily, one hour after taking your morning medications for the next 2 weeks, keep a log and send [place] the readings through mychart  Important Information About Sugar

## 2022-01-10 NOTE — Patient Instructions (Signed)
Visit Information  Thank you for taking time to visit with me today. Please don't hesitate to contact me if I can be of assistance to you.   Following are the goals we discussed today:   Goals Addressed             This Visit's Progress    I am taking care of my wound        Evaluation of current treatment plan related to Wound care and patient's adherence to plan as established by provider Advised patient to call if any signs of infection Reviewed medications with patient and discussed importance  Discussed plans with patient for ongoing care management follow up and provided patient with direct contact information for care management team Mr. Canady lives alone and is currently receiving care from [facility]. They visited him on Sunday and his wound appears to be healing well, with clear drainage. He is changing the dressings himself and is not experiencing any pain. Today, he has an appointment with cardiology to discuss medications he was taken off during his hospital stay.  We called [facility] pharmacy to information about medication delivery. I spoke with [place] [facility], who informed me that the pharmacy could deliver his medications, but not controlled substances. Additionally, they require Mr. Cueto insurance information on file. However, he did not have his Medicare card with him. I provided him with his ID number, which he can provide to the pharmacy for their records.         Our next appointment is by telephone on 01/28/22 at 1 pm  Please call the care guide team at [phone] if you need to cancel or reschedule your appointment.   If you are experiencing a Mental Health or Behavioral Health Crisis or need someone to talk to, please call 1-800-273-TALK (toll free, 24 hour hotline)  Patient verbalizes understanding of instructions and care plan provided today and agrees to view in MyChart. Active MyChart status and patient understanding of how to access instructions  and care plan via MyChart confirmed with patient.     Juanell Fairly RN, BSN, [facility] Care Coordinator [facility]   Phone: [phone]

## 2022-01-10 NOTE — Patient Outreach (Signed)
  Care Coordination   Follow Up Visit Note   01/10/2022 Name: Anthony [name] MRN: [id number] DOB: Dec 27, 1970  Anthony [name] is a 51 y.o. year old male who sees Anthony Alley, DO for primary care. I spoke with  Anthony [name] by phone today.  What matters to the patients health and wellness today?  I am taking care of my wound    Goals Addressed             This Visit's Progress    I am taking care of my wound        Evaluation of current treatment plan related to Wound care and patient's adherence to plan as established by provider Advised patient to call if any signs of infection Reviewed medications with patient and discussed importance  Discussed plans with patient for ongoing care management follow up and provided patient with direct contact information for care management team Anthony [name] lives alone and is currently receiving care from [facility]. They visited him on Sunday and his wound appears to be healing well, with clear drainage. He is changing the dressings himself and is not experiencing any pain. Today, he has an appointment with cardiology to discuss medications he was taken off during his hospital stay.  We called [name] pharmacy to information about medication delivery. I spoke with Anthony [name], who informed me that the pharmacy could deliver his medications, but not controlled substances. Additionally, they require Anthony [name] insurance information on file. However, he did not have his Medicare card with him. I provided him with his ID number, which he can provide to the pharmacy for their records.         SDOH assessments and interventions completed:  No     Care Coordination Interventions:  Yes, provided   Follow up plan:  Follow up call scheduled for 01/28/22 1 pm  Encounter Outcome:  Pt. Visit Completed   Anthony Fairly RN, BSN, [facility] Care Coordinator [facility]   Phone: [phone]

## 2022-01-10 NOTE — Progress Notes (Signed)
Office Visit    Patient Name: Anthony [name] Date of Encounter: 01/10/2022  PCP:  Joya Salm   [facility]  Cardiologist:  [name], MD  Advanced Practice Provider:  No care team member to display Electrophysiologist:  Will Jorja Loa, MD    HPI    Anthony [name] is a 51 y.o. male with a past medical history significant for spina bifida wheelchair-bound and chronic indwelling catheter, chest pain, strong family history of CAD, remote history of smoking (quit about 10 years ago), HLD (LDL 134) presents today for follow-up appointment.  History includes send nuclear stress test showing no ischemia and 2D echo was normal.  Event monitor with 5 beat run of WCT and occasional PVCs.  Cardiac MRI was performed which shows normal LV size and wall thickness with EF 54%, normal RV size and systolic function EF 57% and no LGE to suggest scar.  Started on Toprol XL 25 mg daily.  He was seen by Dr. Mayford Knife 03/26/2021 and was doing well at that time.  Denied chest pain, shortness of breath, DOE, PND, orthopnea, and syncope.  Chronic pedal edema that had actually improved.  He says he was having some palpitations at night.  He rarely will wake himself up snoring.  He has never awakened gasping for air.  Compliant with all medications.  He was referred to EP due to PVC burden of 18%.  Dr. Elberta Fortis started him on flecainide.  He felt like he had an improvement in his palpitations with this medication.  He was seen by Francis Dowse, PA-C 07/05/2021 and at that time he was feeling pretty well with occasional dizziness with quick movements.  No lightheadedness, no syncope or near syncope.  No chest pain or shortness of breath.  Today, he states he has been doing okay. He had some chest pain when he was in the nursing home that was sharp and on the left side of his chest. He was having some SOB and felt like his heart was jumping around. He has been off his metoprolol and  lisinopril because when he had an infection, his BP was low. Now, he is hypertensive and his HR is 100. We discussed ordering a lipid panel and a CBC to check his H and H.   No edema, orthopnea, PND.  Past Medical History    Past Medical History:  Diagnosis Date   Anxiety    Chronic indwelling Foley catheter    Complication of anesthesia    woken up in surgery before   Dysrhythmia    Endocarditis of mitral valve    Hypertension    Osteomyelitis (HCC)    left hip   Paraplegia (HCC)    secondary to Spina Bifida   PVCs (premature ventricular contractions)    Septic shock (HCC)    due to osteomyelitis   Spina bifida    Wheelchair bound    Past Surgical History:  Procedure Laterality Date   APPLICATION OF WOUND VAC  09/11/2021   Procedure: APPLICATION OF WOUND VAC;  Surgeon: [name], MD;  Location: [facility] OR;  Service: Orthopedics;;   BACK SURGERY     BUBBLE STUDY  09/09/2021   Procedure: BUBBLE STUDY;  Surgeon: Quintella Reichert, MD;  Location: [facility] ENDOSCOPY;  Service: Cardiovascular;;   HIP SURGERY     I & D EXTREMITY Left 09/11/2021   Procedure: LEFT HIP DEBRIDEMENT AND REMOVAL FEMORAL HEAD;  Surgeon: [name], MD;  Location: [facility] OR;  Service: Orthopedics;  Laterality: Left;   I & D EXTREMITY Left 09/13/2021   Procedure: DEBRIDEMENT LEFT HIP;  Surgeon: [name], MD;  Location: [facility] OR;  Service: Orthopedics;  Laterality: Left;   I & D EXTREMITY Left 09/18/2021   Procedure: DEBRIDEMENT LEFT HIP, WOUND VAC EXCHANGE;  Surgeon: [name], MD;  Location[facility] OR;  Service: Orthopedics;  Laterality: Left;   I & D EXTREMITY Left 10/30/2021   Procedure: LEFT HIP DEBRIDEMENT WITH KERECIS PLACEMENT;  Surgeon: [name], MD;  Location: [facility] OR;  Service: Orthopedics;  Laterality: Left;   INCISION AND DRAINAGE OF WOUND N/A 01/24/2017   Procedure: IRRIGATION AND DEBRIDEMENT WOUND- BUTTOCK ABSCESS;  Surgeon: Almond Lint, MD;  Location: [facility] ORS;  Service: General;  Laterality: N/A;    [name] WITHOUT CARDIOVERSION N/A 09/09/2021   Procedure: TRANSESOPHAGEAL ECHOCARDIOGRAM ([name]);  Surgeon: Quintella Reichert, MD;  Location: [facility] ENDOSCOPY;  Service: Cardiovascular;  Laterality: N/A;    Allergies  Allergies  Allergen Reactions   Vancomycin Itching    EKGs/Labs/Other Studies Reviewed:   The following studies were reviewed today:  Echocardiogram 09/09/21  IMPRESSIONS     1. Left ventricular ejection fraction, by estimation, is 60 to 65%. The  left ventricle has normal function. The left ventricle has no regional  wall motion abnormalities.   2. Right ventricular systolic function is normal. The right ventricular  size is normal.   3. Abnormal mitral valve with a small mobile density on the ventricular  side of the anterior mitral valve leaflet. In the setting of bacteremia,  cannot rule out vegetation. Mild mitral valve regurgitation. No evidence  of mitral stenosis.   4. The aortic valve is normal in structure. Aortic valve regurgitation is  trivial. No aortic stenosis is present.   5. The inferior vena cava is normal in size with greater than 50%  respiratory variability, suggesting right atrial pressure of 3 mmHg.   6. No left atrial/left atrial appendage thrombus was detected.   Conclusion(s)/Recommendation(s): Suspicious for possible vegetation on the  anterior mitral valve leaflet.   FINDINGS   Left Ventricle: Left ventricular ejection fraction, by estimation, is 60  to 65%. The left ventricle has normal function. The left ventricle has no  regional wall motion abnormalities. The left ventricular internal cavity  size was normal in size. There is   no left ventricular hypertrophy.   Right Ventricle: The right ventricular size is normal. No increase in  right ventricular wall thickness. Right ventricular systolic function is  normal.   Left Atrium: Left atrial size was normal in size. No left atrial/left  atrial appendage thrombus was detected.   Right  Atrium: Right atrial size was normal in size.   Pericardium: There is no evidence of pericardial effusion.   Mitral Valve: Abnormal mitral valve with possible small density on the  ventricular side of the anterior mitral valve leaflet. In the setting of  bacteremia, cannot rule out vegetation. The mitral valve is abnormal. Mild  mitral valve regurgitation. There  is a unknown size present in the mitral position. No evidence of mitral  valve stenosis.   Tricuspid Valve: The tricuspid valve is normal in structure. Tricuspid  valve regurgitation is mild . No evidence of tricuspid stenosis.   Aortic Valve: The aortic valve is normal in structure. Aortic valve  regurgitation is trivial. No aortic stenosis is present.   Pulmonic Valve: The pulmonic valve was normal in structure. Pulmonic valve  regurgitation is trivial. No evidence of pulmonic stenosis.   Aorta: The aortic root is normal in size and structure.   Venous: The inferior vena cava is normal in size with greater than 50%  respiratory variability, suggesting right atrial pressure of 3 mmHg.   IAS/Shunts: There is redundancy of the interatrial septum. The interatrial  septum appears to be lipomatous. No atrial level shunt detected by color  flow Doppler.    Steffanie Dunn 12/2019 Study Highlights   Nuclear stress EF: 64%. The left ventricular ejection fraction is normal (55-65%). There was no ST segment deviation noted during stress. This is a low risk study. There is no evidence of ischemia or previous infarction . The study is normal.   2D echo 12/2019 IMPRESSIONS    1. Left ventricular ejection fraction, by estimation, is 50 to 55%. The  left ventricle has low normal function. The left ventricle has no regional  wall motion abnormalities. Left ventricular diastolic parameters were  normal.   2. Right ventricular systolic function is normal. The right ventricular  size is normal.   3. The mitral valve is normal  in structure. Trivial mitral valve  regurgitation. No evidence of mitral stenosis.   4. The aortic valve is tricuspid. Aortic valve regurgitation is not  visualized. No aortic stenosis is present.   5. The inferior vena cava is normal in size with greater than 50%  respiratory variability, suggesting right atrial pressure of 3 mmHg.    Event Monitor 12/2019 Study Highlights   Sinus Bradycardia, normal sinus rhythm and sinus tachycardia. The average heart rate was 95bpm and ranged from 50 to 135bpm. 1 run of wide complex tachycardia for 5 beats consistent with nonsustained ventricular tachycardia. Occasional PVCs  EKG:  EKG is not ordered today.     Recent Labs: 09/07/2021: ALT 40 09/09/2021: Magnesium 1.9 10/30/2021: BUN 5; Creatinine, Ser 0.47; Platelets 356; Potassium 3.3; Sodium 143 10/31/2021: Hemoglobin 8.4  Recent Lipid Panel    Component Value Date/Time   CHOL 143 03/26/2021 1356   TRIG 218 (H) 03/26/2021 1356   HDL 26 (L) 03/26/2021 1356   CHOLHDL 5.5 (H) 03/26/2021 1356   CHOLHDL 7.0 02/06/2014 1449   VLDL 43 (H) 02/06/2014 1449   LDLCALC 80 03/26/2021 1356    Home Medications   Current Meds  Medication Sig   acetaminophen (TYLENOL) 500 MG tablet Take 2 tablets (1,000 mg total) by mouth 2 (two) times daily as needed for mild pain, headache or fever.   Catheters (FOLEY CATHETER 2-WAY) MISC Use as directed by urologist.   cyclobenzaprine (FLEXERIL) 5 MG tablet Take 5 mg by mouth See admin instructions. Take 5 mg at bedtime. Take 5 mg every 12 hours as needed for muscle spasms   flecainide (TAMBOCOR) 50 MG tablet Take 1 tablet (50 mg total) by mouth 2 (two) times daily.   ibuprofen (ADVIL) 600 MG tablet Take 1 tablet (600 mg total) by mouth every 6 (six) hours as needed for fever.   Multiple Vitamin (MULTIVITAMIN WITH MINERALS) TABS tablet Take 1 tablet by mouth daily.   Omega-3 Fatty Acids (FISH OIL) 1000 MG CAPS Take 1 capsule by mouth 2 (two) times daily.    rosuvastatin (CRESTOR) 40 MG tablet Take 1 tablet (40 mg total) by mouth daily.     Review of Systems      All other systems reviewed and are otherwise negative except as noted above.  Physical Exam    VS:  BP (!) 160/82  Pulse 100   Ht 5\' 8"  (1.727 m)   Wt 180 lb (81.6 kg)   SpO2 98%   BMI 27.37 kg/m  , BMI Body mass index is 27.37 kg/m.  Wt Readings from Last 3 Encounters:  01/10/22 180 lb (81.6 kg)  10/30/21 168 lb (76.2 kg)  09/11/21 193 lb 5.5 oz (87.7 kg)     GEN: Well nourished, well developed, in no acute distress. HEENT: normal. Neck: Supple, no JVD, carotid bruits, or masses. Cardiac: RRR with occasional skipped beat, no murmurs, rubs, or gallops. No clubbing, cyanosis, right 1+ pitting edema, left no edema.  Radials/PT 2+ and equal bilaterally.  Respiratory:  Respirations regular and unlabored, clear to auscultation bilaterally. GI: Soft, nontender, nondistended. MS: No deformity or atrophy. Skin: Warm and dry, no rash. Neuro:  Strength and sensation are intact. Psych: Normal affect.  Assessment & Plan    Chest pain -in the nursing home sharp pains on the left side couples a times a week -will give him nitro to use if this reoccurs -If reoccurs would do an ischemic workup given his strong family history  HLD -Lipid panel ordered today -continue crestor 40mg  daily  Palpitations/NSVT/PVCs -Monitor 4/23 PVC burden was 18% therefore his metoprolol was increased to 50 mg twice daily and he was referred to EP -started on Flecainde with improvement, last saw  EP 06/2021  Hypertension -elevated today since he is off his metoprolol and lisinopril/HCTZ, will restart both of these medications -continue to monitor at home and send me those values   Disposition: Follow up 1 month  with Armanda Magicraci Turner, MD or APP.  Signed, Sharlene Doryessa N Sharika Mosquera, PA-C 01/10/2022, 3:11 PM [facility]

## 2022-01-14 ENCOUNTER — Encounter: Payer: Medicare Other | Admitting: *Deleted

## 2022-01-15 DIAGNOSIS — T8131XD Disruption of external operation (surgical) wound, not elsewhere classified, subsequent encounter: Secondary | ICD-10-CM | POA: Diagnosis not present

## 2022-01-15 DIAGNOSIS — N360 Urethral fistula: Secondary | ICD-10-CM | POA: Diagnosis not present

## 2022-01-15 DIAGNOSIS — L89214 Pressure ulcer of right hip, stage 4: Secondary | ICD-10-CM | POA: Diagnosis not present

## 2022-01-15 DIAGNOSIS — L89894 Pressure ulcer of other site, stage 4: Secondary | ICD-10-CM | POA: Diagnosis not present

## 2022-01-15 DIAGNOSIS — M71052 Abscess of bursa, left hip: Secondary | ICD-10-CM | POA: Diagnosis not present

## 2022-01-15 DIAGNOSIS — Z945 Skin transplant status: Secondary | ICD-10-CM | POA: Diagnosis not present

## 2022-01-16 DIAGNOSIS — L89894 Pressure ulcer of other site, stage 4: Secondary | ICD-10-CM | POA: Diagnosis not present

## 2022-01-16 DIAGNOSIS — Z945 Skin transplant status: Secondary | ICD-10-CM | POA: Diagnosis not present

## 2022-01-16 DIAGNOSIS — L89214 Pressure ulcer of right hip, stage 4: Secondary | ICD-10-CM | POA: Diagnosis not present

## 2022-01-16 DIAGNOSIS — M71052 Abscess of bursa, left hip: Secondary | ICD-10-CM | POA: Diagnosis not present

## 2022-01-16 DIAGNOSIS — N360 Urethral fistula: Secondary | ICD-10-CM | POA: Diagnosis not present

## 2022-01-16 DIAGNOSIS — T8131XD Disruption of external operation (surgical) wound, not elsewhere classified, subsequent encounter: Secondary | ICD-10-CM | POA: Diagnosis not present

## 2022-01-17 ENCOUNTER — Ambulatory Visit: Payer: Medicare Other | Attending: Student

## 2022-01-20 DIAGNOSIS — T8131XD Disruption of external operation (surgical) wound, not elsewhere classified, subsequent encounter: Secondary | ICD-10-CM | POA: Diagnosis not present

## 2022-01-20 DIAGNOSIS — N360 Urethral fistula: Secondary | ICD-10-CM | POA: Diagnosis not present

## 2022-01-20 DIAGNOSIS — Z945 Skin transplant status: Secondary | ICD-10-CM | POA: Diagnosis not present

## 2022-01-20 DIAGNOSIS — L89894 Pressure ulcer of other site, stage 4: Secondary | ICD-10-CM | POA: Diagnosis not present

## 2022-01-20 DIAGNOSIS — L89214 Pressure ulcer of right hip, stage 4: Secondary | ICD-10-CM | POA: Diagnosis not present

## 2022-01-20 DIAGNOSIS — M71052 Abscess of bursa, left hip: Secondary | ICD-10-CM | POA: Diagnosis not present

## 2022-01-21 ENCOUNTER — Ambulatory Visit: Payer: Medicare Other | Admitting: Orthopedic Surgery

## 2022-01-22 DIAGNOSIS — L89214 Pressure ulcer of right hip, stage 4: Secondary | ICD-10-CM | POA: Diagnosis not present

## 2022-01-22 DIAGNOSIS — L89894 Pressure ulcer of other site, stage 4: Secondary | ICD-10-CM | POA: Diagnosis not present

## 2022-01-22 DIAGNOSIS — N360 Urethral fistula: Secondary | ICD-10-CM | POA: Diagnosis not present

## 2022-01-22 DIAGNOSIS — T8131XD Disruption of external operation (surgical) wound, not elsewhere classified, subsequent encounter: Secondary | ICD-10-CM | POA: Diagnosis not present

## 2022-01-22 DIAGNOSIS — M71052 Abscess of bursa, left hip: Secondary | ICD-10-CM | POA: Diagnosis not present

## 2022-01-22 DIAGNOSIS — Z945 Skin transplant status: Secondary | ICD-10-CM | POA: Diagnosis not present

## 2022-01-23 ENCOUNTER — Ambulatory Visit: Payer: Self-pay | Admitting: *Deleted

## 2022-01-23 NOTE — Patient Instructions (Signed)
Visit Information  Thank you for taking time to visit with me today. Please don't hesitate to contact me if I can be of assistance to you.   Following are the goals we discussed today:   Goals Addressed             This Visit's Progress    Get Medicaid and help at home       Care Coordination Interventions: Reviewed Care Coordination Services:  Assessed Social Determinants of Health Collaborated with DSS/Medicaid office re: pending Medicaid application Made referral to Care Guide to assist with: pt already connected with and resources provided  Department of Social Services ( Medicaid, Physicist, medical ) Transportation provided by [employer] provider (Medicaid transport if approved-currently uses SCAT/GTA)  SCAT transportation ( ) [name] options (Getting MOMS meals and referred to MOW's by Care Guide) Housing  (needs help getting trash, supply boxes, etc out of apartment)  Financial hardship resource options (limited income- hopefully will be approved for [employer]) Meals on wheels (referral made by Care Guide- currently on wait list) Solution-Focused Strategies employed:  Emotional Support Provided  Assessed needs, level of care concerns, how currently meeting needs and barriers to care Discuss community support options ( PCS, transportation, Voc rehab/Independent Living, google/alexa devices) Discussed family support and building support system : limited support- his mother who has some health issues- would like to connect more through volunteering or work 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 He is able to manage "fairly well" with his ADL's- does need help with getting trash and supply boxes out as well as could benefit from [facility] when/if approved for Medicaid. Currently has [facility] RN coming out 3 times weekly for wound care. Pt uses GTA/SCAT for transportation and is currently getting MOMS meals. Reviewed basic eligibility and provided education on Personal Care Service  process,  Solution-Focused Strategies employed:  Problem [place] strategies reviewed [name] discussed and will email pt info on EPASS to check Medicaid status, Voc Rehab/Independent Living,  Pt denies depression- does have anxiety and is currently not on RX for this but thinks he may need it- suggests he discuss with PCP  Pt recently released from SNF rehab- he has been volunteering there every few weeks "to help the residents out". Encouraged pt to seek additional ways to volunteer and give back as he does find this rewarding          Our next appointment is by telephone on 01/30/22  Please call the care guide team at [phone] if you need to cancel or reschedule your appointment.   If you are experiencing a Mental Health or [place] or need someone to talk to, please call the Suicide and Crisis Lifeline: 988   Patient verbalizes understanding of instructions and care plan provided today and agrees to view in [place]. Active MyChart status and patient understanding of how to access instructions and care plan via MyChart confirmed with patient.     Telephone follow up appointment with care management team member scheduled for: 01/30/22 Eduard Clos MSW, LCSW Licensed Clinical Social Worker   [phone]

## 2022-01-23 NOTE — Patient Outreach (Signed)
  Care Coordination   Initial Visit Note   01/23/2022 Name: Anthony [name] MRN: [id number] DOB: 1970-11-24  Anthony [name] is a 52 y.o. year old male who sees Anthony Gilding, DO for primary care. I spoke with  Anthony [name] by phone today.  What matters to the patients health and wellness today?  Need help at home and Medicaid    Goals Addressed             This Visit's Progress    Get Medicaid and help at home       Care Coordination Interventions: Reviewed Care Coordination Services:  Assessed Social Determinants of Health Collaborated with DSS/Medicaid office re: pending Medicaid application Made referral to Care Guide to assist with: pt already connected with and resources provided  Department of Social Services ( Medicaid, Physicist, medical ) Transportation provided by [employer] provider (Medicaid transport if approved-currently uses SCAT/GTA)  SCAT transportation ( ) Anthony [name] (Getting MOMS meals and referred to MOW's by Care Guide) Housing  (needs help getting trash, supply boxes, etc out of apartment)  Financial hardship resource [name] (limited income- hopefully will be approved for [employer]) Meals on wheels (referral made by Care Guide- currently on wait list) Solution-Focused Strategies employed:  Emotional Support Provided  Assessed needs, level of care concerns, how currently meeting needs and barriers to care Discuss community support [name] ( PCS, transportation, Voc rehab/Independent Living, google/alexa devices) Discussed family support and building support system : limited support- his mother who has some health issues- would like to connect more through volunteering or work 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 He is able to manage "fairly well" with his ADL's- does need help with getting trash and supply boxes out as well as could benefit from [facility] when/if approved for Medicaid. Currently has [facility] RN coming out 3 times weekly for wound care. Pt  uses GTA/SCAT for transportation and is currently getting MOMS meals. Reviewed basic eligibility and provided education on Personal Care Service process,  Solution-Focused Strategies employed:  Problem Anthony [name] strategies reviewed [name] discussed and will email pt info on EPASS to check Medicaid status, Voc Rehab/Independent Living,  Pt denies depression- does have anxiety and is currently not on RX for this but thinks he may need it- suggests he discuss with PCP  Pt recently released from SNF rehab- he has been volunteering there every few weeks "to help the residents out". Encouraged pt to seek additional ways to volunteer and give back as he does find this rewarding          SDOH assessments and interventions completed:  Yes  SDOH Interventions Today    Flowsheet Row Most Recent Value  SDOH Interventions   Alcohol Usage Interventions Intervention Not Indicated (Score <7)  Financial Strain Interventions Other (Comment)  [medicaid pending]  Physical Activity Interventions Intervention Not Indicated  Stress Interventions Provide Counseling  Social Connections Interventions Intervention Not Indicated        Care Coordination Interventions:  Yes, provided   Follow up plan: Follow up call scheduled for 01/29/22    Encounter Outcome:  Pt. Visit Completed

## 2022-01-24 DIAGNOSIS — N360 Urethral fistula: Secondary | ICD-10-CM | POA: Diagnosis not present

## 2022-01-24 DIAGNOSIS — Z945 Skin transplant status: Secondary | ICD-10-CM | POA: Diagnosis not present

## 2022-01-24 DIAGNOSIS — L89214 Pressure ulcer of right hip, stage 4: Secondary | ICD-10-CM | POA: Diagnosis not present

## 2022-01-24 DIAGNOSIS — T8131XD Disruption of external operation (surgical) wound, not elsewhere classified, subsequent encounter: Secondary | ICD-10-CM | POA: Diagnosis not present

## 2022-01-24 DIAGNOSIS — M71052 Abscess of bursa, left hip: Secondary | ICD-10-CM | POA: Diagnosis not present

## 2022-01-24 DIAGNOSIS — L89894 Pressure ulcer of other site, stage 4: Secondary | ICD-10-CM | POA: Diagnosis not present

## 2022-01-28 ENCOUNTER — Telehealth: Payer: Self-pay | Admitting: Orthopedic Surgery

## 2022-01-28 ENCOUNTER — Ambulatory Visit: Payer: Self-pay

## 2022-01-28 NOTE — Patient Instructions (Signed)
Visit Information  Thank you for taking time to visit with me today. Please don't hesitate to contact me if I can be of assistance to you.   Following are the goals we discussed today:   Goals Addressed             This Visit's Progress    I am taking care of my wound        Evaluation of current treatment plan related to Wound care and patient's adherence to plan as established by provider Advised patient to call if any signs of infection Reviewed medications with patient and discussed importance  Discussed plans with patient for ongoing care management follow up and provided patient with direct contact information for care management team According to Mr. Hestand, his wound is healing well and he has not experienced any odor, heat, or pain at the site. [facility] is providing care to him threetimes a week, and he is taking his medications as prescribed. Additionally, he has a friend who is willing to help him pick up his medication as needed.         Our next appointment is by telephone on 02/11/22 at 130 pm  Please call the care guide team at [phone] if you need to cancel or reschedule your appointment.   If you are experiencing a Mental Health or [place] or need someone to talk to, please call 1-800-273-TALK (toll free, 24 hour hotline)  Patient verbalizes understanding of instructions and care plan provided today and agrees to view in [place]. Active MyChart status and patient understanding of how to access instructions and care plan via MyChart confirmed with patient.     Lazaro Arms RN, BSN, [facility]   Phone: [phone]

## 2022-01-28 NOTE — Telephone Encounter (Signed)
I called and sw pt and advised that we could not give ABX we needed to see him in the office he was advised last month that he really needed another debridement surgery and then did not keep his last appt. Offered pt appt tomorrow and he declined. I offered appt on Thursday morning and he asked for the afternoon so appt made for 2:30. Advised pt that if he starts to feel worse N/V increased temp etc should proceed to the ER. Pt voiced understanding.

## 2022-01-28 NOTE — Patient Outreach (Signed)
  Care Coordination   Follow Up Visit Note   01/28/2022 Name: BRICE KOSSMAN MRN: [id number] DOB: [birth year]  IZZAC ROCKETT is a 52 y.o. year old male who sees Precious Gilding, DO for primary care. I spoke with  [name] by phone today.  What matters to the patients health and wellness today?  My wound is healing    Goals Addressed             This Visit's Progress    I am taking care of my wound        Evaluation of current treatment plan related to Wound care and patient's adherence to plan as established by provider Advised patient to call if any signs of infection Reviewed medications with patient and discussed importance  Discussed plans with patient for ongoing care management follow up and provided patient with direct contact information for care management team According to Mr. Lipford, his wound is healing well and he has not experienced any odor, heat, or pain at the site. [facility] is providing care to him threetimes a week, and he is taking his medications as prescribed. Additionally, he has a friend who is willing to help him pick up his medication as needed.         SDOH assessments and interventions completed:  No     Care Coordination Interventions:  Yes, provided   Follow up plan: Follow up call scheduled for 02/11/22 130 pm    Encounter Outcome:  Pt. Visit Completed   Lazaro Arms RN, BSN, [facility]   Phone: [phone]

## 2022-01-28 NOTE — Telephone Encounter (Signed)
Patient called advised he's been having a fever for 4 days now and he tried to make an appointment with his PCP and they can not see him until the end of the month.   Patient asked if he can get an antibiotic sent to his pharmacy?  The number to contact patient is [phone]

## 2022-01-29 DIAGNOSIS — L89894 Pressure ulcer of other site, stage 4: Secondary | ICD-10-CM | POA: Diagnosis not present

## 2022-01-29 DIAGNOSIS — Z945 Skin transplant status: Secondary | ICD-10-CM | POA: Diagnosis not present

## 2022-01-29 DIAGNOSIS — T8131XD Disruption of external operation (surgical) wound, not elsewhere classified, subsequent encounter: Secondary | ICD-10-CM | POA: Diagnosis not present

## 2022-01-29 DIAGNOSIS — L89214 Pressure ulcer of right hip, stage 4: Secondary | ICD-10-CM | POA: Diagnosis not present

## 2022-01-29 DIAGNOSIS — M71052 Abscess of bursa, left hip: Secondary | ICD-10-CM | POA: Diagnosis not present

## 2022-01-29 DIAGNOSIS — N360 Urethral fistula: Secondary | ICD-10-CM | POA: Diagnosis not present

## 2022-01-30 ENCOUNTER — Encounter: Payer: Self-pay | Admitting: *Deleted

## 2022-01-30 ENCOUNTER — Ambulatory Visit (INDEPENDENT_AMBULATORY_CARE_PROVIDER_SITE_OTHER): Payer: Medicare Other | Admitting: Orthopedic Surgery

## 2022-01-30 DIAGNOSIS — L89220 Pressure ulcer of left hip, unstageable: Secondary | ICD-10-CM

## 2022-01-30 MED ORDER — DOXYCYCLINE HYCLATE 100 MG PO TABS: 100.0000 mg | ORAL_TABLET | Freq: Two times a day (BID) | ORAL | 0 refills | Status: DC

## 2022-01-31 ENCOUNTER — Ambulatory Visit: Payer: Medicare Other | Admitting: Cardiology

## 2022-01-31 ENCOUNTER — Ambulatory Visit: Payer: Self-pay | Admitting: *Deleted

## 2022-01-31 NOTE — Patient Outreach (Signed)
  Care Coordination   Follow Up Visit Note   01/31/2022 Name: Anthony [name] MRN: [id number] DOB: Oct 20, 1970  Anthony [name] is a 52 y.o. year old male who sees Precious Gilding, DO for primary care. I spoke with  [name] by phone today.  What matters to the patients health and wellness today?  [name] called pt to update on DSS report indicating pt does have a pending Medicaid application. Pt reports he received a call from "[facility]" so hopefully the process is moving forward.  Pt saw Ortho MD yesterday- per pt, was started on ABX- still may need a debridement. [name] will inquire next week with DSS worker and pt.   Goals Addressed   None     SDOH assessments and interventions completed:  Yes     Care Coordination Interventions:  Yes, provided   Follow up plan: Follow up call scheduled for 02/04/22    Encounter Outcome:  Pt. Visit Completed

## 2022-02-04 ENCOUNTER — Telehealth: Payer: Self-pay | Admitting: Orthopedic Surgery

## 2022-02-04 ENCOUNTER — Encounter: Payer: Self-pay | Admitting: *Deleted

## 2022-02-04 DIAGNOSIS — I493 Ventricular premature depolarization: Secondary | ICD-10-CM | POA: Diagnosis not present

## 2022-02-04 DIAGNOSIS — L89894 Pressure ulcer of other site, stage 4: Secondary | ICD-10-CM | POA: Diagnosis not present

## 2022-02-04 DIAGNOSIS — M86452 Chronic osteomyelitis with draining sinus, left femur: Secondary | ICD-10-CM | POA: Diagnosis not present

## 2022-02-04 DIAGNOSIS — D638 Anemia in other chronic diseases classified elsewhere: Secondary | ICD-10-CM | POA: Diagnosis not present

## 2022-02-04 DIAGNOSIS — T8131XD Disruption of external operation (surgical) wound, not elsewhere classified, subsequent encounter: Secondary | ICD-10-CM | POA: Diagnosis not present

## 2022-02-04 DIAGNOSIS — I4581 Long QT syndrome: Secondary | ICD-10-CM | POA: Diagnosis not present

## 2022-02-04 DIAGNOSIS — N360 Urethral fistula: Secondary | ICD-10-CM | POA: Diagnosis not present

## 2022-02-04 DIAGNOSIS — L89214 Pressure ulcer of right hip, stage 4: Secondary | ICD-10-CM | POA: Diagnosis not present

## 2022-02-04 DIAGNOSIS — M00852 Arthritis due to other bacteria, left hip: Secondary | ICD-10-CM | POA: Diagnosis not present

## 2022-02-04 DIAGNOSIS — N319 Neuromuscular dysfunction of bladder, unspecified: Secondary | ICD-10-CM | POA: Diagnosis not present

## 2022-02-04 DIAGNOSIS — E43 Unspecified severe protein-calorie malnutrition: Secondary | ICD-10-CM | POA: Diagnosis not present

## 2022-02-04 DIAGNOSIS — G8220 Paraplegia, unspecified: Secondary | ICD-10-CM | POA: Diagnosis not present

## 2022-02-04 DIAGNOSIS — K5901 Slow transit constipation: Secondary | ICD-10-CM | POA: Diagnosis not present

## 2022-02-04 DIAGNOSIS — Z945 Skin transplant status: Secondary | ICD-10-CM | POA: Diagnosis not present

## 2022-02-04 DIAGNOSIS — F419 Anxiety disorder, unspecified: Secondary | ICD-10-CM | POA: Diagnosis not present

## 2022-02-04 DIAGNOSIS — Z9359 Other cystostomy status: Secondary | ICD-10-CM | POA: Diagnosis not present

## 2022-02-04 DIAGNOSIS — M162 Bilateral osteoarthritis resulting from hip dysplasia: Secondary | ICD-10-CM | POA: Diagnosis not present

## 2022-02-04 DIAGNOSIS — I10 Essential (primary) hypertension: Secondary | ICD-10-CM | POA: Diagnosis not present

## 2022-02-04 DIAGNOSIS — I872 Venous insufficiency (chronic) (peripheral): Secondary | ICD-10-CM | POA: Diagnosis not present

## 2022-02-04 DIAGNOSIS — E785 Hyperlipidemia, unspecified: Secondary | ICD-10-CM | POA: Diagnosis not present

## 2022-02-04 DIAGNOSIS — Q651 Congenital dislocation of hip, bilateral: Secondary | ICD-10-CM | POA: Diagnosis not present

## 2022-02-04 DIAGNOSIS — M71052 Abscess of bursa, left hip: Secondary | ICD-10-CM | POA: Diagnosis not present

## 2022-02-04 DIAGNOSIS — I059 Rheumatic mitral valve disease, unspecified: Secondary | ICD-10-CM | POA: Diagnosis not present

## 2022-02-04 DIAGNOSIS — I330 Acute and subacute infective endocarditis: Secondary | ICD-10-CM | POA: Diagnosis not present

## 2022-02-04 DIAGNOSIS — Q059 Spina bifida, unspecified: Secondary | ICD-10-CM | POA: Diagnosis not present

## 2022-02-04 NOTE — Telephone Encounter (Signed)
Patient states he can't find the orders that Dr. Sharol Given, wrote out for his care. Please call patient..[phone]

## 2022-02-05 ENCOUNTER — Ambulatory Visit: Payer: Self-pay | Admitting: *Deleted

## 2022-02-05 DIAGNOSIS — L89214 Pressure ulcer of right hip, stage 4: Secondary | ICD-10-CM | POA: Diagnosis not present

## 2022-02-05 DIAGNOSIS — L89894 Pressure ulcer of other site, stage 4: Secondary | ICD-10-CM | POA: Diagnosis not present

## 2022-02-05 DIAGNOSIS — T8131XD Disruption of external operation (surgical) wound, not elsewhere classified, subsequent encounter: Secondary | ICD-10-CM | POA: Diagnosis not present

## 2022-02-05 DIAGNOSIS — M71052 Abscess of bursa, left hip: Secondary | ICD-10-CM | POA: Diagnosis not present

## 2022-02-05 DIAGNOSIS — N360 Urethral fistula: Secondary | ICD-10-CM | POA: Diagnosis not present

## 2022-02-05 DIAGNOSIS — Z945 Skin transplant status: Secondary | ICD-10-CM | POA: Diagnosis not present

## 2022-02-05 NOTE — Patient Outreach (Signed)
  Care Coordination   02/05/2022 Name: Anthony [name] MRN: [id number] DOB: December 27, 1970   Care Coordination Outreach Attempts:  An unsuccessful telephone outreach was attempted today to offer the patient information about available care coordination services as a benefit of their health plan.   Follow Up Plan:  Additional outreach attempts will be made to offer the patient care coordination information and services.   Encounter Outcome:  No Answer   Care Coordination Interventions:  No, not indicated    Eduard Clos, MSW, [place] Worker [employer] [employer] [facility] [phone]

## 2022-02-06 ENCOUNTER — Ambulatory Visit: Payer: Medicare Other | Admitting: Orthopedic Surgery

## 2022-02-06 ENCOUNTER — Telehealth: Payer: Self-pay | Admitting: Orthopedic Surgery

## 2022-02-06 NOTE — Telephone Encounter (Signed)
Patient aware of the below message from Dr. Sharol Given

## 2022-02-06 NOTE — Telephone Encounter (Signed)
Spoke with nurse from [employer] and she states they are going to send him to the wound center since he has multiple wounds other than the one we are looking at  [facility]

## 2022-02-06 NOTE — Telephone Encounter (Signed)
Wound care orders are not clear per [facility] [id number]. They want to speak to someone about getting patient into some type of wound care. Please advise

## 2022-02-09 ENCOUNTER — Encounter: Payer: Self-pay | Admitting: Orthopedic Surgery

## 2022-02-09 NOTE — Progress Notes (Signed)
Office Visit Note   Patient: Anthony [name]           Date of Birth: 1970/10/05           MRN: [id number] Visit Date: 01/30/2022              Requested by: Erick Alley, DO [address] [place],  [place] [address] PCP: Erick Alley, DO  Chief Complaint  Patient presents with   Left Hip - Routine Post Op    10/30/21 left hip debridement kerecis graft       HPI: Patient is a 52 year old gentleman who presents 40-month status post debridement left hip.  Previous examination had discussed the possibility of hip disarticulation.  Patient wants to continue with current wound care.  Patient states that he had some elevated temperature yesterday.  He states he has no temperature today and feels better.  Assessment & Plan: Visit Diagnoses:  1. Decubitus ulcer of hip, left, unstageable (HCC)     Plan: Will continue packing the wound he was given a prescription for doxycycline.  Follow-up in 2 weeks at which time we will set up surgery in about 4 weeks.  Anticipate hip disarticulation.  Follow-Up Instructions: Return in about 2 weeks (around 02/13/2022).   Ortho Exam  Patient is alert, oriented, no adenopathy, well-dressed, normal affect, normal respiratory effort. Examination patient's temperature is 99.9.  The wound is 1 cm diameter 6 cm deep with no purulent drainage no cellulitis.  Imaging: No results found. No images are attached to the encounter.  Labs: Lab Results  Component Value Date   REPTSTATUS 11/04/2021 FINAL 10/30/2021   GRAMSTAIN  10/30/2021    RARE WBC PRESENT,BOTH PMN AND MONONUCLEAR NO ORGANISMS SEEN    CULT  10/30/2021    RARE CORYNEBACTERIUM STRIATUM Standardized susceptibility testing for this organism is not available. NO ANAEROBES ISOLATED Performed at [facility], [address] [place], [place] [address]    [facility] ENTEROCOCCUS FAECALIS 09/11/2021     Lab Results  Component Value Date   ALBUMIN <1.5 (L) 09/07/2021   ALBUMIN <1.5 (L)  09/06/2021   ALBUMIN <1.5 (L) 09/04/2021    Lab Results  Component Value Date   MG 1.9 09/09/2021   MG 1.9 09/08/2021   MG 1.8 09/07/2021   No results found for: "VD25OH"  No results found for: "PREALBUMIN"    Latest Ref Rng & Units 10/31/2021    3:45 PM 10/31/2021    8:34 AM 10/30/2021    9:36 AM  CBC EXTENDED  Hemoglobin 13.0 - 17.0 g/dL 8.4  7.8  6.7   HCT 35.5 - 52.0 % 26.3  24.4  21.8      There is no height or weight on file to calculate BMI.  Orders:  No orders of the defined types were placed in this encounter.  Meds ordered this encounter  Medications   doxycycline (VIBRA-TABS) 100 MG tablet    Sig: Take 1 tablet (100 mg total) by mouth 2 (two) times daily.    Dispense:  60 tablet    Refill:  0     Procedures: No procedures performed  Clinical Data: No additional findings.  ROS:  All other systems negative, except as noted in the HPI. Review of Systems  Objective: Vital Signs: There were no vitals taken for this visit.  Specialty Comments:  No specialty comments available.  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    Endocarditis of mitral valve    Hypokalemia    Sepsis (HCC)    Osteomyelitis (HCC)    Pyogenic arthritis of left hip (HCC)    Septic arthritis of hip (HCC) 09/03/2021   Prolonged Q-T interval on ECG 09/03/2021   Pressure injury of skin of left buttock 05/13/2021   PVCs (premature ventricular contractions)    Atypical chest pain 01/04/2020   Slow transit constipation 03/06/2017   Neurogenic bladder 03/06/2017   Suprapubic catheter ([place]) 03/06/2017   Paraplegia ([place]) 01/29/2017   Pressure injury of skin 01/27/2017   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/24/2017 01/24/2017   Status post debridement 01/24/2017   Hyperlipidemia 03/11/2014    Spina bifida of lumbar region ([place]) 09/30/2012   Routine adult health maintenance 03/26/2012   ALLERGIC RHINITIS 06/01/2007   Anxiety 03/19/2006   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   VENOUS INSUFFICIENCY, CHRONIC 03/19/2006   Pressure ulcer 03/19/2006   Past Medical History:  Diagnosis Date   Anxiety    Chronic indwelling Foley catheter    Complication of anesthesia    woken up in surgery before   Dysrhythmia    Endocarditis of mitral valve    Hypertension    Osteomyelitis ([place])    left hip   Paraplegia ([place])    secondary to Spina Bifida   PVCs (premature ventricular contractions)    Septic shock ([place])    due to osteomyelitis   Spina bifida    Wheelchair bound     Family History  Problem Relation Age of Onset   CAD Father        died age 83 from MI   Sudden Cardiac Death Sister        age 75   CAD Maternal Grandfather        died age 19 MI    Past Surgical History:  Procedure Laterality Date   APPLICATION OF WOUND VAC  09/11/2021   Procedure: APPLICATION OF WOUND VAC;  Surgeon: Newt Minion, MD;  Location: [facility];  Service: Orthopedics;;   BACK SURGERY     BUBBLE STUDY  09/09/2021   Procedure: BUBBLE STUDY;  Surgeon: Sueanne Margarita, MD;  Location: [facility];  Service: Cardiovascular;;   HIP SURGERY     I & D EXTREMITY Left 09/11/2021   Procedure: LEFT HIP DEBRIDEMENT AND REMOVAL FEMORAL HEAD;  Surgeon: Newt Minion, MD;  Location: [facility];  Service: Orthopedics;  Laterality: Left;   I & D EXTREMITY Left 09/13/2021   Procedure: DEBRIDEMENT LEFT HIP;  Surgeon: Newt Minion, MD;  Location: [facility];  Service: Orthopedics;  Laterality: Left;   I & D EXTREMITY Left 09/18/2021   Procedure: DEBRIDEMENT LEFT HIP, WOUND VAC EXCHANGE;  Surgeon: Newt Minion, MD;  Location: [facility];  Service: Orthopedics;  Laterality: Left;   I & D EXTREMITY Left 10/30/2021   Procedure: LEFT HIP DEBRIDEMENT WITH KERECIS PLACEMENT;  Surgeon: Newt Minion, MD;  Location: [facility];  Service:  Orthopedics;  Laterality: Left;   INCISION AND DRAINAGE OF WOUND N/A 01/24/2017   Procedure: IRRIGATION AND DEBRIDEMENT WOUND- BUTTOCK ABSCESS;  Surgeon: Stark Klein, MD;  Location: [facility];  Service: General;  Laterality: N/A;   [name] WITHOUT CARDIOVERSION N/A 09/09/2021   Procedure: TRANSESOPHAGEAL ECHOCARDIOGRAM ([name]);  Surgeon: Sueanne Margarita, MD;  Location: [facility];  Service: Cardiovascular;  Laterality: N/A;  Social History   Occupational History   Not on file  Tobacco Use   Smoking status: Former    Types: Cigarettes   Smokeless tobacco: Never  Vaping Use   Vaping Use: Never used  Substance and Sexual Activity   Alcohol use: No   Drug use: Not on file   Sexual activity: Not on file

## 2022-02-11 ENCOUNTER — Ambulatory Visit: Payer: Self-pay

## 2022-02-11 ENCOUNTER — Ambulatory Visit: Payer: Medicare Other | Admitting: Family

## 2022-02-11 DIAGNOSIS — L89894 Pressure ulcer of other site, stage 4: Secondary | ICD-10-CM | POA: Diagnosis not present

## 2022-02-11 DIAGNOSIS — L89214 Pressure ulcer of right hip, stage 4: Secondary | ICD-10-CM | POA: Diagnosis not present

## 2022-02-11 DIAGNOSIS — T8131XD Disruption of external operation (surgical) wound, not elsewhere classified, subsequent encounter: Secondary | ICD-10-CM | POA: Diagnosis not present

## 2022-02-11 DIAGNOSIS — Z945 Skin transplant status: Secondary | ICD-10-CM | POA: Diagnosis not present

## 2022-02-11 DIAGNOSIS — N360 Urethral fistula: Secondary | ICD-10-CM | POA: Diagnosis not present

## 2022-02-11 DIAGNOSIS — M71052 Abscess of bursa, left hip: Secondary | ICD-10-CM | POA: Diagnosis not present

## 2022-02-11 NOTE — Patient Instructions (Signed)
Visit Information  Thank you for taking time to visit with me today. Please don't hesitate to contact me if I can be of assistance to you.   Following are the goals we discussed today:   Goals Addressed             This Visit's Progress    I am taking care of my wound        Evaluation of current treatment plan related to Wound care and patient's adherence to plan as established by provider Advised patient to call if any signs of infection Reviewed medications with patient and discussed importance  Discussed plans with patient for ongoing care management follow up and provided patient with direct contact information for care management team  Mr. Waterhouse informed me that he is doing well and currently not experiencing any issues. He has four wounds,one on both sides of his bottom, one on his right hip, and another on his left foot. he stated that the wounds are healing well. He will be going to the wound center, but he still has [facility] once a week. During our conversation, the patient mentioned that he no longer requires me to call . I informed him that he can contact his physician if he needs any assistance in the future.           If you are experiencing a Mental Health or [place] or need someone to talk to, please call 1-800-273-TALK (toll free, 24 hour hotline)  Patient verbalizes understanding of instructions and care plan provided today and agrees to view in [place]. Active MyChart status and patient understanding of how to access instructions and care plan via MyChart confirmed with patient.     No further follow up required  Lazaro Arms RN, BSN, [facility]   Phone: [phone]

## 2022-02-11 NOTE — Patient Outreach (Signed)
  Care Coordination   Follow Up Visit Note   02/11/2022 Name: Anthony [name] MRN: [id number] DOB: 09-01-1970  Anthony [name] is a 52 y.o. year old male who sees Precious Gilding, DO for primary care. I spoke with  [name] by phone today.  What matters to the patients health and wellness today?  [facility]    Goals Addressed             This Visit's Progress    I am taking care of my wound        Evaluation of current treatment plan related to Wound care and patient's adherence to plan as established by provider Advised patient to call if any signs of infection Reviewed medications with patient and discussed importance  Discussed plans with patient for ongoing care management follow up and provided patient with direct contact information for care management team  [name] informed me that he is doing well and currently not experiencing any issues. He has four wounds,one on both sides of his bottom, one on his right hip, and another on his left foot. he stated that the wounds are healing well. He will be going to the wound center, but he still has [facility] once a week. During our conversation, the patient mentioned that he no longer requires me to call . I informed him that he can contact his physician if he needs any assistance in the future.         SDOH assessments and interventions completed:  No     Care Coordination Interventions:  Yes, provided   Follow up plan: No further intervention required.   Encounter Outcome:  Pt. Visit Completed   Lazaro Arms RN, BSN, [facility]   Phone: [phone]

## 2022-02-13 ENCOUNTER — Ambulatory Visit: Payer: Self-pay | Admitting: *Deleted

## 2022-02-13 NOTE — Patient Outreach (Signed)
  Care Coordination   Follow Up Visit Note   02/13/2022 Name: Anthony [name] MRN: [id number] DOB: 1970/06/14  Anthony [name] is a 52 y.o. year old male who sees Anthony Gilding, DO for primary care. I spoke with  Anthony [name] by phone today.  What matters to the patients health and wellness today?  Eager to get to the wound center- initial visit planned for 03/10/22    Goals Addressed             This Visit's Progress    Get Medicaid and help at home       Care Coordination Interventions: Pt is scheduled to be seen at [facility] next month- he is also awaiting Medicaid determination- [name] called DSS today to inquire about status: applied in September and DDS is in process of determination.          Pt indicates he did receive email with resources from [place]- no questions.   Reviewed Care Coordination Services:  Assessed Social Determinants of Health Collaborated with DSS/Medicaid office re: pending Medicaid application Made referral to Care Guide to assist with: pt already connected with and resources provided  Department of Social Services ( Medicaid, Physicist, medical ) Transportation provided by [employer] provider (Medicaid transport if approved-currently uses SCAT/GTA)  SCAT transportation ( ) [name] options (Getting MOMS meals and referred to MOW's by Care Guide) Housing  (needs help getting trash, supply boxes, etc out of apartment)  Financial hardship resource options (limited income- hopefully will be approved for [employer]) Meals on wheels (referral made by Care Guide- currently on wait list) Solution-Focused Strategies employed:  Emotional Support Provided  Assessed needs, level of care concerns, how currently meeting needs and barriers to care Discuss community support options ( PCS, transportation, Voc rehab/Independent Living, google/alexa devices) Discussed family support and building support system : limited support- his mother who has some health issues- would like  to connect more through volunteering or work 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 He is able to manage "fairly well" with his ADL's- does need help with getting trash and supply boxes out as well as could benefit from [facility] when/if approved for Medicaid. Currently has [place] RN coming out 3 times weekly for wound care. Pt uses GTA/SCAT for transportation and is currently getting MOMS meals. Reviewed basic eligibility and provided education on Personal Care Service process,  Solution-Focused Strategies employed:  Problem [place] strategies reviewed [name] discussed and will email pt info on EPASS to check Medicaid status, Voc Rehab/Independent Living,  Pt denies depression- does have anxiety and is currently not on RX for this but thinks he may need it- suggests he discuss with PCP  Pt recently released from SNF rehab- he has been volunteering there every few weeks "to help the residents out". Encouraged pt to seek additional ways to volunteer and give back as he does find this rewarding          SDOH assessments and interventions completed:  Yes     Care Coordination Interventions:  Yes, provided   Follow up plan: Follow up call scheduled for 03/11/22    Encounter Outcome:  Pt. Visit Completed

## 2022-02-13 NOTE — Patient Instructions (Signed)
Visit Information  Thank you for taking time to visit with me today. Please don't hesitate to contact me if I can be of assistance to you.   Following are the goals we discussed today:   Goals Addressed             This Visit's Progress    Get Medicaid and help at home       Care Coordination Interventions: Pt is scheduled to be seen at [facility] next month- he is also awaiting Medicaid determination- [name] called DSS today to inquire about status: applied in September and DDS is in process of determination.          Pt indicates he did receive email with resources from [place]- no questions.   Reviewed Care Coordination Services:  Assessed Social Determinants of Health Collaborated with DSS/Medicaid office re: pending Medicaid application Made referral to Care Guide to assist with: pt already connected with and resources provided  Department of Social Services ( Medicaid, Physicist, medical ) Transportation provided by [employer] provider (Medicaid transport if approved-currently uses SCAT/GTA)  SCAT transportation ( ) [name] options (Getting MOMS meals and referred to MOW's by Care Guide) Housing  (needs help getting trash, supply boxes, etc out of apartment)  Financial hardship resource options (limited income- hopefully will be approved for [employer]) Meals on wheels (referral made by Care Guide- currently on wait list) Solution-Focused Strategies employed:  Emotional Support Provided  Assessed needs, level of care concerns, how currently meeting needs and barriers to care Discuss community support options ( PCS, transportation, Voc rehab/Independent Living, google/alexa devices) Discussed family support and building support system : limited support- his mother who has some health issues- would like to connect more through volunteering or work 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 He is able to manage "fairly well" with his ADL's- does need help with getting trash and  supply boxes out as well as could benefit from [facility] when/if approved for Medicaid. Currently has [place] RN coming out 3 times weekly for wound care. Pt uses GTA/SCAT for transportation and is currently getting MOMS meals. Reviewed basic eligibility and provided education on Personal Care Service process,  Solution-Focused Strategies employed:  Problem [place] strategies reviewed [name] discussed and will email pt info on EPASS to check Medicaid status, Voc Rehab/Independent Living,  Pt denies depression- does have anxiety and is currently not on RX for this but thinks he may need it- suggests he discuss with PCP  Pt recently released from SNF rehab- he has been volunteering there every few weeks "to help the residents out". Encouraged pt to seek additional ways to volunteer and give back as he does find this rewarding          Our next appointment is by telephone on 03/11/22    Please call the care guide team at [phone] if you need to cancel or reschedule your appointment.   If you are experiencing a Mental Health or [place] or need someone to talk to, please call the Suicide and Crisis Lifeline: 988 call 911   The patient verbalized understanding of instructions, educational materials, and care plan provided today and DECLINED offer to receive copy of patient instructions, educational materials, and care plan.   Telephone follow up appointment with care management team member scheduled for: 03/11/22  Eduard Clos, MSW, [place] Worker [employer] [phone]

## 2022-02-14 DIAGNOSIS — Z945 Skin transplant status: Secondary | ICD-10-CM | POA: Diagnosis not present

## 2022-02-14 DIAGNOSIS — N360 Urethral fistula: Secondary | ICD-10-CM | POA: Diagnosis not present

## 2022-02-14 DIAGNOSIS — L89894 Pressure ulcer of other site, stage 4: Secondary | ICD-10-CM | POA: Diagnosis not present

## 2022-02-14 DIAGNOSIS — M71052 Abscess of bursa, left hip: Secondary | ICD-10-CM | POA: Diagnosis not present

## 2022-02-14 DIAGNOSIS — L89214 Pressure ulcer of right hip, stage 4: Secondary | ICD-10-CM | POA: Diagnosis not present

## 2022-02-14 DIAGNOSIS — T8131XD Disruption of external operation (surgical) wound, not elsewhere classified, subsequent encounter: Secondary | ICD-10-CM | POA: Diagnosis not present

## 2022-02-14 NOTE — Progress Notes (Deleted)
    SUBJECTIVE:   CHIEF COMPLAINT / HPI:   *** 52 yo male w/ spinda bifida and wheel chair bound w/ multiple wounds. Currently follows with Dr. Sharol Given and is s/p debridement of ulcer on L hip. Currently taking doxy and plan for hip disarticulation in future. Also has wounds *** Was referred to wound care center ***  HTN and HLD Follow with cardiology. Currently taking metoprolol succinate 50 mg BID, lisinopril-HCTZ 20-25 mg daily (restarted at last cards visit). Also takes Crestor 40 mg daily. Lipid panel and CBC ordered at last cards visit but not obtained.   OBJECTIVE:   There were no vitals taken for this visit. ***  General: NAD, pleasant, able to participate in exam Cardiac: RRR, no murmurs. Respiratory: CTAB, normal effort, No wheezes, rales or rhonchi Abdomen: Bowel sounds present, nontender, nondistended, no hepatosplenomegaly. Extremities: no edema or cyanosis. Skin: warm and dry, no rashes noted Neuro: alert, no obvious focal deficits Psych: Normal affect and mood  ASSESSMENT/PLAN:   No problem-specific Assessment & Plan notes found for this encounter.     Dr. Precious Gilding, [place]    {    This will disappear when note is signed, click to select method of visit    :1}

## 2022-02-17 ENCOUNTER — Ambulatory Visit: Payer: Medicare Other | Admitting: Student

## 2022-02-17 ENCOUNTER — Telehealth: Payer: Self-pay

## 2022-02-17 MED ORDER — LISINOPRIL-HYDROCHLOROTHIAZIDE 20-25 MG PO TABS: 1.0000 | ORAL_TABLET | Freq: Every day | ORAL | 3 refills | Status: DC

## 2022-02-17 NOTE — Telephone Encounter (Signed)
The patient was notified that he is supposed to be taking 1 tab daily of the lisinopril hctz.

## 2022-02-21 ENCOUNTER — Encounter: Payer: Self-pay | Admitting: Physician Assistant

## 2022-02-21 ENCOUNTER — Ambulatory Visit: Payer: Medicare Other | Attending: Physician Assistant | Admitting: Physician Assistant

## 2022-02-21 ENCOUNTER — Ambulatory Visit: Payer: Medicare Other | Attending: Physician Assistant

## 2022-02-21 VITALS — BP 128/68 | HR 95 | Ht 68.0 in | Wt 175.0 lb

## 2022-02-21 DIAGNOSIS — I4729 Other ventricular tachycardia: Secondary | ICD-10-CM | POA: Diagnosis not present

## 2022-02-21 DIAGNOSIS — R079 Chest pain, unspecified: Secondary | ICD-10-CM | POA: Insufficient documentation

## 2022-02-21 DIAGNOSIS — E785 Hyperlipidemia, unspecified: Secondary | ICD-10-CM

## 2022-02-21 DIAGNOSIS — I10 Essential (primary) hypertension: Secondary | ICD-10-CM | POA: Diagnosis not present

## 2022-02-21 DIAGNOSIS — R002 Palpitations: Secondary | ICD-10-CM | POA: Diagnosis not present

## 2022-02-21 DIAGNOSIS — I493 Ventricular premature depolarization: Secondary | ICD-10-CM | POA: Diagnosis not present

## 2022-02-21 NOTE — Progress Notes (Signed)
Office Visit    Patient Name: Anthony [name] Date of Encounter: 02/21/2022  PCP:  Jones, Sarah, [facility]  Cardiologist:  Fransico Him, MD  Advanced Practice Provider:  No care team member to display Electrophysiologist:  Will Meredith Leeds, MD    HPI    Anthony [name] is a 52 y.o. male with a past medical history significant for spina bifida wheelchair-bound and chronic indwelling catheter, chest pain, strong family history of CAD, remote history of smoking (quit about 10 years ago), HLD (LDL 134) presents today for follow-up appointment.  History includes send nuclear stress test showing no ischemia and 2D echo was normal.  Event monitor with 5 beat run of WCT and occasional PVCs.  Cardiac MRI was performed which shows normal LV size and wall thickness with EF 54%, normal RV size and systolic function EF 87% and no LGE to suggest scar.  Started on Toprol XL 25 mg daily.  He was seen by Dr. Radford Pax 03/26/2021 and was doing well at that time.  Denied chest pain, shortness of breath, DOE, PND, orthopnea, and syncope.  Chronic pedal edema that had actually improved.  He says he was having some palpitations at night.  He rarely will wake himself up snoring.  He has never awakened gasping for air.  Compliant with all medications.  He was referred to EP due to PVC burden of 18%.  Dr. Curt Bears started him on flecainide.  He felt like he had an improvement in his palpitations with this medication.  He was seen by Tommye Standard, PA-C 07/05/2021 and at that time he was feeling pretty well with occasional dizziness with quick movements.  No lightheadedness, no syncope or near syncope.  No chest pain or shortness of breath.  He was last seen by me in December and  he stated he has been doing okay. He had some chest pain when he was in the nursing home that was sharp and on the left side of his chest. He was having some SOB and felt like his heart was jumping around. He has been  off his metoprolol and lisinopril because when he had an infection, his BP was low. Now, he is hypertensive and his HR is 100. We discussed ordering a lipid panel and a CBC to check his H and H.   Today, he comes in today worried about a low blood pressure.  When he was working physical therapy his blood pressure dropped to 100/60.  Not related to changing in position.  He has been dealing with his left hip and numerous surgeries for several months.  Most recent surgery was 3 to 4 months ago where they "cut too deep".  It ended up that his hip needed debrided several times.  When he left the hospital he passed out and his blood pressure was 60/40 so he monitors it closely at home.  His blood pressure is normal today at 128/68.  I do not think we need to make any medication adjustments today however, I have encouraged him to keep track of his blood pressure at home and if he has multiple readings under 100 on the top number to give [place] call.  He feels his chest pains have become more infrequent and they are usually sharp in nature and do not last long.  This is not usually typical of cardiac pain.  We will defer an ischemic workup for now.  No lightheadedness, dizziness, syncope, or presyncope.  Reports no shortness of breath nor dyspnea on exertion. No edema, orthopnea, PND. Reports no palpitations.   Past Medical History    Past Medical History:  Diagnosis Date   Anxiety    Chronic indwelling Foley catheter    Complication of anesthesia    woken up in surgery before   Dysrhythmia    Endocarditis of mitral valve    Hypertension    Osteomyelitis ([place])    left hip   Paraplegia (HCC)    secondary to Spina Bifida   PVCs (premature ventricular contractions)    Septic shock ([place])    due to osteomyelitis   Spina bifida    Wheelchair bound    Past Surgical History:  Procedure Laterality Date   APPLICATION OF WOUND VAC  09/11/2021   Procedure: APPLICATION OF WOUND VAC;  Surgeon: Newt Minion,  MD;  Location: [facility];  Service: Orthopedics;;   BACK SURGERY     BUBBLE STUDY  09/09/2021   Procedure: BUBBLE STUDY;  Surgeon: Sueanne Margarita, MD;  Location: [facility];  Service: Cardiovascular;;   HIP SURGERY     I & D EXTREMITY Left 09/11/2021   Procedure: LEFT HIP DEBRIDEMENT AND REMOVAL FEMORAL HEAD;  Surgeon: Newt Minion, MD;  Location: [facility];  Service: Orthopedics;  Laterality: Left;   I & D EXTREMITY Left 09/13/2021   Procedure: DEBRIDEMENT LEFT HIP;  Surgeon: Newt Minion, MD;  Location: [facility];  Service: Orthopedics;  Laterality: Left;   I & D EXTREMITY Left 09/18/2021   Procedure: DEBRIDEMENT LEFT HIP, WOUND VAC EXCHANGE;  Surgeon: Newt Minion, MD;  Location: [facility];  Service: Orthopedics;  Laterality: Left;   I & D EXTREMITY Left 10/30/2021   Procedure: LEFT HIP DEBRIDEMENT WITH KERECIS PLACEMENT;  Surgeon: Newt Minion, MD;  Location: [facility];  Service: Orthopedics;  Laterality: Left;   INCISION AND DRAINAGE OF WOUND N/A 01/24/2017   Procedure: IRRIGATION AND DEBRIDEMENT WOUND- BUTTOCK ABSCESS;  Surgeon: Stark Klein, MD;  Location: [facility];  Service: General;  Laterality: N/A;   [name] WITHOUT CARDIOVERSION N/A 09/09/2021   Procedure: TRANSESOPHAGEAL ECHOCARDIOGRAM ([name]);  Surgeon: Sueanne Margarita, MD;  [facility];  Service: Cardiovascular;  Laterality: N/A;    Allergies  Allergies  Allergen Reactions   Vancomycin Itching    EKGs/Labs/Other Studies Reviewed:   The following studies were reviewed today:  Echocardiogram 09/09/21  IMPRESSIONS     1. Left ventricular ejection fraction, by estimation, is 60 to 65%. The  left ventricle has normal function. The left ventricle has no regional  wall motion abnormalities.   2. Right ventricular systolic function is normal. The right ventricular  size is normal.   3. Abnormal mitral valve with a small mobile density on the ventricular  side of the anterior mitral valve leaflet. In the setting of bacteremia,   cannot rule out vegetation. Mild mitral valve regurgitation. No evidence  of mitral stenosis.   4. The aortic valve is normal in structure. Aortic valve regurgitation is  trivial. No aortic stenosis is present.   5. The inferior vena cava is normal in size with greater than 50%  respiratory variability, suggesting right atrial pressure of 3 mmHg.   6. No left atrial/left atrial appendage thrombus was detected.   Conclusion(s)/Recommendation(s): Suspicious for possible vegetation on the  anterior mitral valve leaflet.   FINDINGS   Left Ventricle: Left ventricular ejection fraction, by estimation, is 60  to 65%. The left ventricle has normal  function. The left ventricle has no  regional wall motion abnormalities. The left ventricular internal cavity  size was normal in size. There is   no left ventricular hypertrophy.   Right Ventricle: The right ventricular size is normal. No increase in  right ventricular wall thickness. Right ventricular systolic function is  normal.   Left Atrium: Left atrial size was normal in size. No left atrial/left  atrial appendage thrombus was detected.   Right Atrium: Right atrial size was normal in size.   Pericardium: There is no evidence of pericardial effusion.   Mitral Valve: Abnormal mitral valve with possible small density on the  ventricular side of the anterior mitral valve leaflet. In the setting of  bacteremia, cannot rule out vegetation. The mitral valve is abnormal. Mild  mitral valve regurgitation. There  is a unknown size present in the mitral position. No evidence of mitral  valve stenosis.   Tricuspid Valve: The tricuspid valve is normal in structure. Tricuspid  valve regurgitation is mild . No evidence of tricuspid stenosis.   Aortic Valve: The aortic valve is normal in structure. Aortic valve  regurgitation is trivial. No aortic stenosis is present.   Pulmonic Valve: The pulmonic valve was normal in structure. Pulmonic valve   regurgitation is trivial. No evidence of pulmonic stenosis.   Aorta: The aortic root is normal in size and structure.   Venous: The inferior vena cava is normal in size with greater than 50%  respiratory variability, suggesting right atrial pressure of 3 mmHg.   IAS/Shunts: There is redundancy of the interatrial septum. The interatrial  septum appears to be lipomatous. No atrial level shunt detected by color  flow Doppler.    Leane Call 12/2019 Study Highlights   Nuclear stress EF: 64%. The left ventricular ejection fraction is normal (55-65%). There was no ST segment deviation noted during stress. This is a low risk study. There is no evidence of ischemia or previous infarction . The study is normal.   2D echo 12/2019 IMPRESSIONS    1. Left ventricular ejection fraction, by estimation, is 50 to 55%. The  left ventricle has low normal function. The left ventricle has no regional  wall motion abnormalities. Left ventricular diastolic parameters were  normal.   2. Right ventricular systolic function is normal. The right ventricular  size is normal.   3. The mitral valve is normal in structure. Trivial mitral valve  regurgitation. No evidence of mitral stenosis.   4. The aortic valve is tricuspid. Aortic valve regurgitation is not  visualized. No aortic stenosis is present.   5. The inferior vena cava is normal in size with greater than 50%  respiratory variability, suggesting right atrial pressure of 3 mmHg.    Event Monitor 12/2019 Study Highlights   Sinus Bradycardia, normal sinus rhythm and sinus tachycardia. The average heart rate was 95bpm and ranged from 50 to 135bpm. 1 run of wide complex tachycardia for 5 beats consistent with nonsustained ventricular tachycardia. Occasional PVCs  EKG:  EKG is not ordered today.     Recent Labs: 09/07/2021: ALT 40 09/09/2021: Magnesium 1.9 10/30/2021: BUN 5; Creatinine, Ser 0.47; Platelets 356; Potassium 3.3; Sodium  143 10/31/2021: Hemoglobin 8.4  Recent Lipid Panel    Component Value Date/Time   CHOL 143 03/26/2021 1356   TRIG 218 (H) 03/26/2021 1356   HDL 26 (L) 03/26/2021 1356   CHOLHDL 5.5 (H) 03/26/2021 1356   CHOLHDL 7.0 02/06/2014 1449   VLDL 43 (H) 02/06/2014 1449  LDLCALC 80 03/26/2021 1356    Home Medications   Current Meds  Medication Sig   acetaminophen (TYLENOL) 500 MG tablet Take 2 tablets (1,000 mg total) by mouth 2 (two) times daily as needed for mild pain, headache or fever.   Catheters (FOLEY CATHETER 2-WAY) MISC Use as directed by urologist.   doxycycline (VIBRA-TABS) 100 MG tablet Take 1 tablet (100 mg total) by mouth 2 (two) times daily.   flecainide (TAMBOCOR) 50 MG tablet Take 1 tablet (50 mg total) by mouth 2 (two) times daily.   ibuprofen (ADVIL) 600 MG tablet Take 1 tablet (600 mg total) by mouth every 6 (six) hours as needed for fever.   lisinopril-hydrochlorothiazide (ZESTORETIC) 20-25 MG tablet Take 1 tablet by mouth daily.   metoprolol succinate (TOPROL-XL) 50 MG 24 hr tablet Take 1 tablet (50 mg total) by mouth 2 (two) times daily. Take with or immediately following a meal.   Multiple Vitamin (MULTIVITAMIN WITH MINERALS) TABS tablet Take 1 tablet by mouth daily.   nitroGLYCERIN (NITROSTAT) 0.4 MG SL tablet Place 1 tablet (0.4 mg total) under the tongue every 5 (five) minutes as needed for chest pain.   Omega-3 Fatty Acids (FISH OIL) 1000 MG CAPS Take 1 capsule by mouth 2 (two) times daily.   Probiotic Product (PROBIOTIC DAILY PO) Take 1 tablet by mouth daily at 6 (six) AM.   rosuvastatin (CRESTOR) 40 MG tablet Take 1 tablet (40 mg total) by mouth daily.     Review of Systems      All other systems reviewed and are otherwise negative except as noted above.  Physical Exam    VS:  BP 128/68   Pulse 95   Ht 5\' 8"  (1.727 m)   Wt 175 lb (79.4 kg)   SpO2 96%   BMI 26.61 kg/m  , BMI Body mass index is 26.61 kg/m.  Wt Readings from Last 3 Encounters:   02/21/22 175 lb (79.4 kg)  01/10/22 180 lb (81.6 kg)  10/30/21 168 lb (76.2 kg)     GEN: Well nourished, well developed, in no acute distress. HEENT: normal. Neck: Supple, no JVD, carotid bruits, or masses. Cardiac: RRR with occasional skipped beat, no murmurs, rubs, or gallops. No clubbing, cyanosis, no edema, left no edema.  Radials/PT 2+ and equal bilaterally.  Respiratory:  Respirations regular and unlabored, clear to auscultation bilaterally. GI: Soft, nontender, nondistended. Skin: Warm and dry, no rash. Neuro:  Strength and sensation are intact. Psych: Normal affect.  Assessment & Plan    Chest pain -sharp pains on the left side, have become more rare but still happening -would defer ischemic workup at this time since his symptoms are fleeting and he did not even pick up his nitro medication (and has not needed it) -sister, dad and mother have heart disease  HLD -Lipid panel ordered today -continue crestor 40mg  daily  Palpitations/NSVT/PVCs -Monitor 4/23 PVC burden was 18% therefore his metoprolol was increased to 50 mg twice daily and he was referred to EP -started on Flecainde with improvement, last saw  EP 06/2021  Hypertension -well controlled today -Continue current medication regimen with lisinopril/HCTZ and metoprolol succinate 50 mg twice daily -continue to monitor at home and send me those values   Disposition: Follow up 6 months  with 5/23, MD or APP.  Signed, 07/2021, PA-C 02/21/2022, 4:48 PM [facility]

## 2022-02-21 NOTE — Patient Instructions (Signed)
Medication Instructions:  Your physician recommends that you continue on your current medications as directed. Please refer to the Current Medication list given to you today.  *If you need a refill on your cardiac medications before your next appointment, please call your pharmacy*   Lab Work: None ordered If you have labs (blood work) drawn today and your tests are completely normal, you will receive your results only by: [place] (if you have MyChart) OR A paper copy in the mail If you have any lab test that is abnormal or we need to change your treatment, we will call you to review the results.   Follow-Up: At [facility], you and your health needs are our priority.  As part of our continuing mission to provide you with exceptional heart care, we have created designated Provider Care Teams.  These Care Teams include your primary Cardiologist (physician) and Advanced Practice Providers (APPs -  Physician Assistants and Nurse Practitioners) who all work together to provide you with the care you need, when you need it.   Your next appointment:   6 month(s)  Provider:   Fransico Him, MD    Other Instructions Keep track of your blood pressure and if your systolic (top number) is less than 100 multiple times, call and let us know.

## 2022-02-22 LAB — CBC
Hematocrit: 29 % — ABNORMAL LOW (ref 37.5–51.0)
Hemoglobin: 8.6 g/dL — ABNORMAL LOW (ref 13.0–17.7)
MCH: 20.7 pg — ABNORMAL LOW (ref 26.6–33.0)
MCHC: 29.7 g/dL — ABNORMAL LOW (ref 31.5–35.7)
MCV: 70 fL — ABNORMAL LOW (ref 79–97)
Platelets: 504 10*3/uL — ABNORMAL HIGH (ref 150–450)
RBC: 4.15 x10E6/uL (ref 4.14–5.80)
RDW: 17.2 % — ABNORMAL HIGH (ref 11.6–15.4)
WBC: 8 10*3/uL (ref 3.4–10.8)

## 2022-02-22 LAB — LIPID PANEL
Chol/HDL Ratio: 4.2 ratio (ref 0.0–5.0)
Cholesterol, Total: 109 mg/dL (ref 100–199)
HDL: 26 mg/dL — ABNORMAL LOW (ref >39)
LDL Chol Calc (NIH): 57 mg/dL (ref 0–99)
Triglycerides: 148 mg/dL (ref 0–149)
VLDL Cholesterol Cal: 26 mg/dL (ref 5–40)

## 2022-02-26 DIAGNOSIS — Z945 Skin transplant status: Secondary | ICD-10-CM | POA: Diagnosis not present

## 2022-02-26 DIAGNOSIS — L89214 Pressure ulcer of right hip, stage 4: Secondary | ICD-10-CM | POA: Diagnosis not present

## 2022-02-26 DIAGNOSIS — N360 Urethral fistula: Secondary | ICD-10-CM | POA: Diagnosis not present

## 2022-02-26 DIAGNOSIS — L89894 Pressure ulcer of other site, stage 4: Secondary | ICD-10-CM | POA: Diagnosis not present

## 2022-02-26 DIAGNOSIS — M71052 Abscess of bursa, left hip: Secondary | ICD-10-CM | POA: Diagnosis not present

## 2022-02-26 DIAGNOSIS — T8131XD Disruption of external operation (surgical) wound, not elsewhere classified, subsequent encounter: Secondary | ICD-10-CM | POA: Diagnosis not present

## 2022-02-27 ENCOUNTER — Ambulatory Visit (INDEPENDENT_AMBULATORY_CARE_PROVIDER_SITE_OTHER): Payer: Medicare Other | Admitting: Family Medicine

## 2022-02-27 ENCOUNTER — Encounter: Payer: Medicare Other | Admitting: *Deleted

## 2022-02-27 ENCOUNTER — Encounter: Payer: Self-pay | Admitting: Family Medicine

## 2022-02-27 VITALS — BP 124/75 | HR 95 | Temp 98.8°F

## 2022-02-27 DIAGNOSIS — N390 Urinary tract infection, site not specified: Secondary | ICD-10-CM

## 2022-02-27 DIAGNOSIS — T83511A Infection and inflammatory reaction due to indwelling urethral catheter, initial encounter: Secondary | ICD-10-CM

## 2022-02-27 DIAGNOSIS — R509 Fever, unspecified: Secondary | ICD-10-CM | POA: Insufficient documentation

## 2022-02-27 LAB — POCT URINALYSIS DIP (MANUAL ENTRY)
Bilirubin, UA: NEGATIVE
Glucose, UA: NEGATIVE mg/dL
Ketones, POC UA: NEGATIVE mg/dL
Nitrite, UA: POSITIVE — AB
Protein Ur, POC: 100 mg/dL — AB
Spec Grav, UA: 1.015 (ref 1.010–1.025)
Urobilinogen, UA: 2 E.U./dL — AB
pH, UA: >=9 — AB (ref 5.0–8.0)

## 2022-02-27 MED ORDER — AMOXICILLIN 875 MG PO TABS: 875.0000 mg | ORAL_TABLET | Freq: Two times a day (BID) | ORAL | 0 refills | Status: DC

## 2022-02-27 NOTE — Patient Instructions (Addendum)
You most likely have a urine infection. We are getting a urine test today and will send out a culture to the lab.   I am sending amoxicillin for 10 days 2 times a day to the pharmacy. Please make a follow up appointment in 2-3 weeks.   Please follow up with Dr. Sharol Given so he can check your hip.

## 2022-02-27 NOTE — Progress Notes (Signed)
    SUBJECTIVE:   CHIEF COMPLAINT / HPI:   Fever Patient has been having fevers for about a week and half or two weeks. They have been giving him tylenol at his SNF. He says at night the temps are around 100.5. He has been having chills at night.  Says he recently left his SNF.  He has a foot wound on his right foot and hip wounds. He denies increased pain at any of these sites. The left hip continues to have some drainage but has not smelled or increased.   Patient says that he feels like the urine from his suprapubic catheter has been smelling more for the last week.  Patient says that he replaced his suprapubic catheter 2 days ago and replaced the bag this morning.  He has been doxycycline for a month. He has two days left. This was for his septic hip that was repaired. Patient has not began going to the wound center.   Denies abdominal pain.  He says he did used to take cipro for his UTIs in the past as well as keflex.   PERTINENT  PMH / PSH: Paraplegia, indwelling suprapubic catheter, septic arthritis of the hip  OBJECTIVE:   BP 124/75   Pulse 95   Temp 98.8 F (37.1 C)   SpO2 100%  General: well appearing, in no acute distress, paraplegic  CV: RRR, radial pulses equal and palpable, no BLE edema  Resp: Normal work of breathing on room air, CTAB Abd: Soft, non tender, non distended, suprapubic catheter in place with no surrounding erythema or drainage   Neuro: Alert & Oriented x 4  Skin: Left hip wound depth of about 3 cm at nadir, serosanguinous drainage, no erythema, R foot wound with minimal depth granulation tissue present and wet from dressing, no erythema or edema  Urine: yellow, not cloudy    ASSESSMENT/PLAN:   Fever Fever while on doxycycline. Unclear cause of low grade fevers. Could be from unstageable hip wound noted by Dr. Sharol Given on 1/11 that patient has been on doxycycline for. At that time Dr. Sharol Given recommended surgery for rearticulation. Could also be CAUTI given  patient's indwelling catheter and fevers. He has a history of vancomycin resistant enterococcus UTI. Last enterococcus sensitive to penicillins.  - Follow up in 2-3 weeks  - Recommended patient follow up with Dr. Sharol Given (had follow up that was missed on 1/25)  - Amoxicillin 875 mg BID for 10 days  - Urinalysis, Urine Cx      Lowry Ram, MD [facility]

## 2022-02-27 NOTE — Assessment & Plan Note (Addendum)
Fever while on doxycycline. Unclear cause of low grade fevers. Could be from unstageable hip wound noted by Dr. Sharol Given on 1/11 that patient has been on doxycycline for. At that time Dr. Sharol Given recommended surgery for rearticulation. Could also be CAUTI given patient's indwelling catheter and fevers. He has a history of vancomycin resistant enterococcus UTI. Last enterococcus sensitive to penicillins.  - Follow up in 2-3 weeks  - Recommended patient follow up with Dr. Sharol Given (had follow up that was missed on 1/25)  - Amoxicillin 875 mg BID for 10 days  - Urinalysis, Urine Cx

## 2022-03-07 ENCOUNTER — Encounter (HOSPITAL_BASED_OUTPATIENT_CLINIC_OR_DEPARTMENT_OTHER): Payer: Medicare Other | Attending: General Surgery | Admitting: General Surgery

## 2022-03-07 DIAGNOSIS — L97512 Non-pressure chronic ulcer of other part of right foot with fat layer exposed: Secondary | ICD-10-CM | POA: Insufficient documentation

## 2022-03-07 DIAGNOSIS — Q057 Lumbar spina bifida without hydrocephalus: Secondary | ICD-10-CM | POA: Insufficient documentation

## 2022-03-07 DIAGNOSIS — L988 Other specified disorders of the skin and subcutaneous tissue: Secondary | ICD-10-CM | POA: Diagnosis not present

## 2022-03-07 DIAGNOSIS — L89312 Pressure ulcer of right buttock, stage 2: Secondary | ICD-10-CM | POA: Diagnosis not present

## 2022-03-07 DIAGNOSIS — G8220 Paraplegia, unspecified: Secondary | ICD-10-CM | POA: Diagnosis not present

## 2022-03-07 DIAGNOSIS — L89324 Pressure ulcer of left buttock, stage 4: Secondary | ICD-10-CM | POA: Diagnosis not present

## 2022-03-07 DIAGNOSIS — L89224 Pressure ulcer of left hip, stage 4: Secondary | ICD-10-CM | POA: Insufficient documentation

## 2022-03-07 DIAGNOSIS — M1990 Unspecified osteoarthritis, unspecified site: Secondary | ICD-10-CM | POA: Insufficient documentation

## 2022-03-07 DIAGNOSIS — L89313 Pressure ulcer of right buttock, stage 3: Secondary | ICD-10-CM | POA: Diagnosis not present

## 2022-03-08 NOTE — Progress Notes (Signed)
Anthony [name] ([id number]) [id number] Page 1 of 11 Visit Report for 03/07/2022 Chief Complaint Document Details Patient Name: Date of Service: Anthony [name], Anthony [name]. 03/07/2022 8:00 [name] M Medical Record Number: [id number] Patient Account Number: [id number] Date of Birth/Sex: Treating RN: 07/31/1970 (52 y.o. M) Primary Care Provider: Precious Gilding Other Clinician: Referring Provider: Treating Provider/Extender: Georg Ruddle in Treatment: 0 Information Obtained from: Patient Chief Complaint Patient is at the clinic for treatment of an open pressure ulcer of the left hip, left ischium, right ischium, and [name] wound on his right foot Electronic Signature(s) Signed: 03/07/2022 11:04:47 AM By: Fredirick Maudlin MD FACS Previous Signature: 03/07/2022 8:37:45 AM Version By: Fredirick Maudlin MD FACS Entered By: Fredirick Maudlin on 03/07/2022 11:04:47 -------------------------------------------------------------------------------- Debridement Details Patient Name: Date of Service: Anthony [name], Anthony [name]. 03/07/2022 8:00 [name] M Medical Record Number: [id number] Patient Account Number: [id number] Date of Birth/Sex: Treating RN: 20-Feb-1970 (52 y.o. Collene Gobble Primary Care Provider: Precious Gilding Other Clinician: Referring Provider: Treating Provider/Extender: Georg Ruddle in Treatment: 0 Debridement Performed for Assessment: Wound #4 Right Gluteal fold Performed By: Physician Fredirick Maudlin, MD Debridement Type: Debridement Level of Consciousness (Pre-procedure): Awake and Alert Pre-procedure Verification/Time Out Yes - 09:20 Taken: Start Time: 09:20 T Area Debrided (L x W): otal 9 (cm) x 5 (cm) = 45 (cm) Tissue and other material debrided: Non-Viable, [place] Level: Non-Viable Tissue Debridement Description: Selective/Open Wound Instrument: Curette Bleeding: Minimum Hemostasis Achieved: Pressure End Time:  09:22 Procedural Pain: 0 Post Procedural Pain: 0 Response to Treatment: Procedure was tolerated well Level of Consciousness (Post- Awake and Alert procedure): Post Debridement Measurements of Total Wound Length: (cm) 9 Stage: Category/Stage II Width: (cm) 5 Depth: (cm) 0.2 Volume: (cm) 7.069 Character of Wound/Ulcer Post Debridement: Improved Post Procedure Diagnosis Anthony [name] ([id number]) [id number] Page 2 of 11 Same as Pre-procedure Notes Scribed for Dr. Celine Ahr by J.Scotton Electronic Signature(s) Signed: 03/07/2022 11:29:12 AM By: Fredirick Maudlin MD FACS Signed: 03/07/2022 5:49:48 PM By: Dellie Catholic RN Entered By: Dellie Catholic on 03/07/2022 09:31:13 -------------------------------------------------------------------------------- Debridement Details Patient Name: Date of Service: Anthony [name], Anthony [name]. 03/07/2022 8:00 [name] M Medical Record Number: [id number] Patient Account Number: [id number] Date of Birth/Sex: Treating RN: 1970-02-12 (52 y.o. Collene Gobble Primary Care Provider: Precious Gilding Other Clinician: Referring Provider: Treating Provider/Extender: Georg Ruddle in Treatment: 0 Debridement Performed for Assessment: Wound #1 Right,Lateral,Dorsal Foot Performed By: Physician Fredirick Maudlin, MD Debridement Type: Debridement Level of Consciousness (Pre-procedure): Awake and Alert Pre-procedure Verification/Time Out Yes - 09:20 Taken: Start Time: 09:20 T Area Debrided (L x W): otal 5 (cm) x 2 (cm) = 10 (cm) Tissue and other material debrided: Non-Viable, [place] Level: Non-Viable Tissue Debridement Description: Selective/Open Wound Instrument: Curette Bleeding: Minimum Hemostasis Achieved: Pressure End Time: 09:22 Procedural Pain: 0 Post Procedural Pain: 0 Response to Treatment: Procedure was tolerated well Level of Consciousness (Post- Awake and Alert procedure): Post Debridement  Measurements of Total Wound Length: (cm) 5 Width: (cm) 2 Depth: (cm) 0.1 Volume: (cm) 0.785 Character of Wound/Ulcer Post Debridement: Improved Post Procedure Diagnosis Same as Pre-procedure Notes Scribed for Dr. Celine Ahr by J.Scotton Electronic Signature(s) Signed: 03/07/2022 11:29:12 AM By: Fredirick Maudlin MD FACS Signed: 03/07/2022 5:49:48 PM By: Dellie Catholic RN Entered By: Dellie Catholic on 03/07/2022 09:33:06 HPI Details -------------------------------------------------------------------------------- Anthony [name] ([id number]) [id number] Page 3 of 11 Patient Name: Date of Service: Anthony [name], Anthony [name]. 03/07/2022 8:00  [name] M Medical Record Number: [id number] Patient Account Number: [id number] Date of Birth/Sex: Treating RN: 1970/12/29 (52 y.o. M) Primary Care Provider: Precious Gilding Other Clinician: Referring Provider: Treating Provider/Extender: Georg Ruddle in Treatment: 0 History of Present Illness HPI Description: ADMISSION 03/07/2022 This is [name] 52 year old man who is paraplegic secondary to lumbar spina bifida. He has had multiple issues with septic arthritis of the left hip and undergone multiple operative interventions including partial excision of the pelvis and acetabulum and partial excision of the proximal femur, soft tissue debridement and tissue rearrangement for closure of the defects. He does have [name] suprapubic catheter and has had mitral valve endocarditis in the past. Looking at the electronic medical record, it appears that Dr. Sharol Given has planned for hip disarticulation, but the patient has been resistant to this. He has 2 sinus tracts, 1 on his left trochanter and 1 on his left ischium. These tunnel for [name] length of 8.5 and 10 cm respectively. The trochanter wound actually tracks all the way to the skin of his perineum and comes through. The 2 tunnels approximate each other but Anthony not directly connect. He has [name] large  ulcer on his right ischium that he says has been present for 12 years. He also has an ulcer on his dorsal lateral right foot with some slough accumulation. Remarkably, he has been packing all of these wounds on his own and per the intake nurse, has been getting adequate packing material into all of these complicated sinus tracts and tunnels. Electronic Signature(s) Signed: 03/07/2022 11:07:17 AM By: Fredirick Maudlin MD FACS Previous Signature: 03/07/2022 8:41:25 AM Version By: Fredirick Maudlin MD FACS Entered By: Fredirick Maudlin on 03/07/2022 11:07:17 -------------------------------------------------------------------------------- Physical Exam Details Patient Name: Date of Service: Anthony [name], Anthony [name]. 03/07/2022 8:00 [name] M Medical Record Number: [id number] Patient Account Number: [id number] Date of Birth/Sex: Treating RN: 1970/02/10 (52 y.o. M) Primary Care Provider: Precious Gilding Other Clinician: Referring Provider: Treating Provider/Extender: Georg Ruddle in Treatment: 0 Constitutional . . . . No acute distress. Respiratory Normal work of breathing on room air. Notes 03/07/2022: He has 2 sinus tracts, 1 on his left trochanter and 1 on his left ischium. These tunnel for [name] length of 8.5 and 10 cm respectively. The trochanter wound actually tracks all the way to the skin of his perineum and comes through. The 2 tunnels approximate each other but Anthony not directly connect. He has [name] large ulcer on his right ischium that he says has been present for 12 years. He also has an ulcer on his dorsal lateral right foot with some slough accumulation Electronic Signature(s) Signed: 03/07/2022 11:07:52 AM By: Fredirick Maudlin MD FACS Entered By: Fredirick Maudlin on 03/07/2022 11:07:52 -------------------------------------------------------------------------------- Physician Orders Details Patient Name: Date of Service: Anthony [name], Anthony [name]. 03/07/2022 8:00 [name] M Medical Record Number:  [id number] Patient Account Number: [id number] Date of Birth/Sex: Treating RN: 07/19/1970 (52 y.o. Collene Gobble Primary Care Provider: Precious Gilding Other Clinician: Referring Provider: Treating Provider/Extender: Georg Ruddle in Treatment: 0 ELOISE, RAFIQUE [name] ([id number]) [id number] Page 4 of 11 Verbal / Phone Orders: No Diagnosis Coding ICD-10 Coding Code Description L89.224 Pressure ulcer of left hip, stage 4 L89.324 Pressure ulcer of left buttock, stage 4 L89.313 Pressure ulcer of right buttock, stage 3 L97.512 Non-pressure chronic ulcer of other part of right foot with fat layer exposed M86.452 Chronic osteomyelitis with draining sinus, left femur M00.9 Pyogenic arthritis, unspecified Q05.7 Lumbar  spina bifida without hydrocephalus Follow-up Appointments Return appointment in 1 month. - Dr. Celine Ahr Room 3 Wound Treatment Wound #1 - Foot Wound Laterality: Dorsal, Right, Lateral Cleanser: Normal Saline Every Other Day/30 Days Discharge Instructions: Cleanse the wound with Normal Saline prior to applying [name] clean dressing using gauze sponges, not tissue or cotton balls. Cleanser: Soap and Water Every Other Day/30 Days Discharge Instructions: May shower and wash wound with dial antibacterial soap and water prior to dressing change. Cleanser: Wound Cleanser Every Other Day/30 Days Discharge Instructions: Cleanse the wound with wound cleanser prior to applying [name] clean dressing using gauze sponges, not tissue or cotton balls. Prim Dressing: Sorbalgon [employer] Dressing, 4x4 (in/in) Every Other Day/30 Days ary Discharge Instructions: Apply to wound bed as instructed Secondary Dressing: ALLEVYN Gentle Border, 4x4 (in/in) Every Other Day/30 Days Discharge Instructions: Apply over primary dressing as directed. Wound #2 - Trochanter Wound Laterality: Left Cleanser: Normal Saline 1 x Per Day/30 Days Discharge Instructions: Cleanse the wound with  Normal Saline prior to applying [name] clean dressing using gauze sponges, not tissue or cotton balls. Cleanser: Wound Cleanser 1 x Per Day/30 Days Discharge Instructions: Cleanse the wound with wound cleanser prior to applying [name] clean dressing using gauze sponges, not tissue or cotton balls. Secondary Dressing: ABD Pad, 8x10 1 x Per Day/30 Days Discharge Instructions: Apply over primary dressing as directed. Secured With: [employer], 4.5x3.1 (in/yd) 1 x Per Day/30 Days Discharge Instructions: Saline Moistened kerlix Secured With: 47M Medipore Soft Cloth Surgical T 2x10 (in/yd) 1 x Per Day/30 Days ape Discharge Instructions: Secure with tape as directed. Wound #3 - Gluteal fold Wound Laterality: Left Cleanser: Normal Saline 1 x Per Day/30 Days Discharge Instructions: Cleanse the wound with Normal Saline prior to applying [name] clean dressing using gauze sponges, not tissue or cotton balls. Cleanser: Wound Cleanser 1 x Per Day/30 Days Discharge Instructions: Cleanse the wound with wound cleanser prior to applying [name] clean dressing using gauze sponges, not tissue or cotton balls. Secondary Dressing: ABD Pad, 8x10 1 x Per Day/30 Days Discharge Instructions: Apply over primary dressing as directed. Secured With: [employer], 4.5x3.1 (in/yd) 1 x Per Day/30 Days Discharge Instructions: Saline Moistened kerlix Secured With: 47M Medipore Soft Cloth Surgical T 2x10 (in/yd) 1 x Per Day/30 Days ape Discharge Instructions: Secure with tape as directed. Wound #4 - Gluteal fold Wound Laterality: Right Cleanser: Normal Saline Every Other Day/30 Days Discharge Instructions: Cleanse the wound with Normal Saline prior to applying [name] clean dressing using gauze sponges, not tissue or cotton balls. Cleanser: Soap and Water Every Other Day/30 Days Discharge Instructions: May shower and wash wound with dial antibacterial soap and water prior to dressing change. Anthony [name], Anthony [name] ([id number])  [id number] Page 5 of 11 Cleanser: Wound Cleanser Every Other Day/30 Days Discharge Instructions: Cleanse the wound with wound cleanser prior to applying [name] clean dressing using gauze sponges, not tissue or cotton balls. Prim Dressing: Sorbalgon AG Dressing 6x6 (in/in) Every Other Day/30 Days ary Discharge Instructions: Apply to wound bed as instructed Secondary Dressing: ALLEVYN Gentle Border, 5x5 (in/in) Every Other Day/30 Days Discharge Instructions: Apply over primary dressing as directed. Electronic Signature(s) Signed: 03/07/2022 11:08:50 AM By: Fredirick Maudlin MD FACS Entered By: Fredirick Maudlin on 03/07/2022 11:08:50 -------------------------------------------------------------------------------- Problem List Details Patient Name: Date of Service: Anthony [name], Anthony [name]. 03/07/2022 8:00 [name] M Medical Record Number: [id number] Patient Account Number: [id number] Date of Birth/Sex: Treating RN: 10-18-1970 (52 y.o. M) Primary Care Provider: Precious Gilding  Other Clinician: Referring Provider: Treating Provider/Extender: Georg Ruddle in Treatment: 0 Active Problems ICD-10 Encounter Code Description Active Date MDM Diagnosis L89.224 Pressure ulcer of left hip, stage 4 03/07/2022 No Yes L89.324 Pressure ulcer of left buttock, stage 4 03/07/2022 No Yes L89.313 Pressure ulcer of right buttock, stage 3 03/07/2022 No Yes L97.512 Non-pressure chronic ulcer of other part of right foot with fat layer exposed 03/07/2022 No Yes M86.452 Chronic osteomyelitis with draining sinus, left femur 03/07/2022 No Yes M00.9 Pyogenic arthritis, unspecified 03/07/2022 No Yes Q05.7 Lumbar spina bifida without hydrocephalus 03/07/2022 No Yes Inactive Problems Resolved Problems Electronic Signature(s) Signed: 03/07/2022 11:02:41 AM By: Fredirick Maudlin MD FACS Previous Signature: 03/07/2022 8:01:36 AM Version By: Fredirick Maudlin MD FACS Entered By: Fredirick Maudlin on  03/07/2022 11:02:40 Anthony [name] ([id number]) [id number] Page 6 of 11 -------------------------------------------------------------------------------- Progress Note Details Patient Name: Date of Service: Anthony [name], Anthony [name]. 03/07/2022 8:00 [name] M Medical Record Number: [id number] Patient Account Number: [id number] Date of Birth/Sex: Treating RN: [birth year] (52 y.o. M) Primary Care Provider: Precious Gilding Other Clinician: Referring Provider: Treating Provider/Extender: Georg Ruddle in Treatment: 0 Subjective Chief Complaint Information obtained from Patient Patient is at the clinic for treatment of an open pressure ulcer of the left hip, left ischium, right ischium, and [name] wound on his right foot History of Present Illness (HPI) ADMISSION 03/07/2022 This is [name] 52 year old man who is paraplegic secondary to lumbar spina bifida. He has had multiple issues with septic arthritis of the left hip and undergone multiple operative interventions including partial excision of the pelvis and acetabulum and partial excision of the proximal femur, soft tissue debridement and tissue rearrangement for closure of the defects. He does have [name] suprapubic catheter and has had mitral valve endocarditis in the past. Looking at the electronic medical record, it appears that Dr. Sharol Given has planned for hip disarticulation, but the patient has been resistant to this. He has 2 sinus tracts, 1 on his left trochanter and 1 on his left ischium. These tunnel for [name] length of 8.5 and 10 cm respectively. The trochanter wound actually tracks all the way to the skin of his perineum and comes through. The 2 tunnels approximate each other but Anthony not directly connect. He has [name] large ulcer on his right ischium that he says has been present for 12 years. He also has an ulcer on his dorsal lateral right foot with some slough accumulation. Remarkably, he has been packing all of these  wounds on his own and per the intake nurse, has been getting adequate packing material into all of these complicated sinus tracts and tunnels. Patient History Information obtained from Patient. Allergies Vancomycin (Severity: Severe) Family History Unknown History. Social History Former smoker, Marital Status - Single, Alcohol Use - Never, Drug Use - No History, Caffeine Use - Never. Medical History Cardiovascular Patient has history of Arrhythmia - Hx: PVCs Musculoskeletal Patient has history of Osteoarthritis - Arthritis of Hip, Osteomyelitis - Left Hip (08/23) Neurologic Patient has history of Paraplegia Hospitalization/Surgery History - IandD debridements Left Hip; [name] without cardioversion. Medical [name] Surgical History Notes nd Cardiovascular Hx: Endocarditis of Mitral Valve Gastrointestinal Ishiorectal Abscess Slow Transit Constipation Genitourinary Chronic indwelling Foley Catheter Frequent UTIs Neurologic Spina Bifida Review of Systems (ROS) Constitutional Symptoms (General Health) Complains or has symptoms of Fever - Pt. reports 100F orally last night. Integumentary (Skin) Complains or has symptoms of Wounds - Right Lateral foot. Objective Anthony [name], Anthony [name] ([id number]) [id number]  Page 7 of 11 Constitutional No acute distress. Vitals Time Taken: 8:10 AM, Height: 68 in, Weight: 165 lbs, BMI: 25.1, Temperature: 99.1 F, Pulse: 93 bpm, Respiratory Rate: 16 breaths/min, Blood Pressure: 129/76 mmHg. Respiratory Normal work of breathing on room air. General Notes: 03/07/2022: He has 2 sinus tracts, 1 on his left trochanter and 1 on his left ischium. These tunnel for [name] length of 8.5 and 10 cm respectively. The trochanter wound actually tracks all the way to the skin of his perineum and comes through. The 2 tunnels approximate each other but Anthony not directly connect. He has [name] large ulcer on his right ischium that he says has been present for 12  years. He also has an ulcer on his dorsal lateral right foot with some slough accumulation Integumentary (Hair, Skin) Wound #1 status is Open. Original cause of wound was Other Lesion. The date acquired was: 07/26/2021. The wound is located on the Right,Lateral,Dorsal Foot. The wound measures 5cm length x 2cm width x 0.1cm depth; 7.854cm^2 area and 0.785cm^3 volume. There is no tunneling or undermining noted. There is [name] medium amount of serosanguineous drainage noted. There is small (1-33%) pink granulation within the wound bed. There is [name] large (67-100%) amount of necrotic tissue within the wound bed including Eschar and Adherent Slough. The periwound skin appearance exhibited: Scarring. General Notes: Clustered wound Wound #2 status is Open. Original cause of wound was Pressure Injury. The date acquired was: 08/27/2021. The wound is located on the Left Trochanter. The wound measures 1.5cm length x 1cm width x 8.5cm depth; 1.178cm^2 area and 10.014cm^3 volume. There is Fat Layer (Subcutaneous Tissue) exposed. There is no undermining noted, however, there is tunneling at 6:00 with [name] maximum distance of 8cm. There is [name] medium amount of serosanguineous drainage noted. There is small (1-33%) pink granulation within the wound bed. There is [name] large (67-100%) amount of necrotic tissue within the wound bed including Adherent Slough. The periwound skin appearance had no abnormalities noted for texture. The periwound skin appearance had no abnormalities noted for moisture. The periwound skin appearance had no abnormalities noted for color. Periwound temperature was noted as No Abnormality. Wound #2 status is Open. Original cause of wound was Pressure Injury. The date acquired was: 08/27/2021. The wound is located on the Left Trochanter. The wound measures 1.5cm length x 1cm width x 8.5cm depth; 1.178cm^2 area and 10.014cm^3 volume. There is Fat Layer (Subcutaneous Tissue) exposed. There is tunneling at 6:00 with [name]  maximum distance of 8cm. There is [name] medium amount of serosanguineous drainage noted. There is small (1-33%) red, hyper - granulation within the wound bed. There is [name] large (67-100%) amount of necrotic tissue within the wound bed including Adherent Slough. The periwound skin appearance had no abnormalities noted for texture. The periwound skin appearance had no abnormalities noted for moisture. The periwound skin appearance had no abnormalities noted for color. Periwound temperature was noted as No Abnormality. Wound #3 status is Open. Original cause of wound was Gradually Appeared. The date acquired was: 08/27/2021. The wound is located on the Left Gluteal fold. The wound measures 1cm length x 1.5cm width x 10cm depth; 1.178cm^2 area and 11.781cm^3 volume. There is Fat Layer (Subcutaneous Tissue) exposed. There is no tunneling or undermining noted. There is [name] medium amount of serosanguineous drainage noted. There is small (1-33%) red granulation within the wound bed. There is [name] large (67-100%) amount of necrotic tissue within the wound bed including Eschar and Adherent Slough. The periwound skin  appearance had no abnormalities noted for moisture. The periwound skin appearance had no abnormalities noted for color. The periwound skin appearance exhibited: Scarring. Periwound temperature was noted as No Abnormality. Wound #4 status is Open. Original cause of wound was Gradually Appeared. The date acquired was: 12/28/2021. The wound is located on the Right Gluteal fold. The wound measures 9cm length x 5cm width x 0.2cm depth; 35.343cm^2 area and 7.069cm^3 volume. There is Fat Layer (Subcutaneous Tissue) exposed. There is no tunneling or undermining noted. There is [name] medium amount of serosanguineous drainage noted. There is medium (34-66%) red granulation within the wound bed. There is [name] medium (34-66%) amount of necrotic tissue within the wound bed including Adherent Slough. The periwound skin appearance had  no abnormalities noted for moisture. The periwound skin appearance had no abnormalities noted for color. The periwound skin appearance exhibited: Scarring. Periwound temperature was noted as No Abnormality. Assessment Active Problems ICD-10 Pressure ulcer of left hip, stage 4 Pressure ulcer of left buttock, stage 4 Pressure ulcer of right buttock, stage 3 Non-pressure chronic ulcer of other part of right foot with fat layer exposed Chronic osteomyelitis with draining sinus, left femur Pyogenic arthritis, unspecified Lumbar spina bifida without hydrocephalus Procedures Wound #1 Pre-procedure diagnosis of Wound #1 is [name] Lesion located on the Right,Lateral,Dorsal Foot . There was [name] Selective/Open Wound Non-Viable Tissue Debridement with [name] total area of 10 sq cm performed by Fredirick Maudlin, MD. With the following instrument(s): Curette to remove Non-Viable tissue/material. Material removed includes [facility] No specimens were taken. [name] time out was conducted at 09:20, prior to the start of the procedure. [name] Minimum amount of bleeding was controlled with Pressure. The procedure was tolerated well with [name] pain level of 0 throughout and [name] pain level of 0 following the procedure. Post Debridement Measurements: 5cm length x 2cm width x 0.1cm depth; 0.785cm^3 volume. Character of Wound/Ulcer Post Debridement is improved. Post procedure Diagnosis Wound #1: Same as Pre-Procedure General Notes: Scribed for Dr. Celine Ahr by J.Scotton. Wound #4 Pre-procedure diagnosis of Wound #4 is [name] Pressure Ulcer located on the Right Gluteal fold . There was [name] Selective/Open Wound Non-Viable Tissue Debridement with [name] total area of 45 sq cm performed by Fredirick Maudlin, MD. With the following instrument(s): Curette to remove Non-Viable tissue/material. Material removed includes [facility]. No specimens were taken. [name] time out was conducted at 09:20, prior to the start of the procedure. [name] Minimum amount of Anthony [name], Anthony [name]  ([id number]) [id number] Page 8 of 11 bleeding was controlled with Pressure. The procedure was tolerated well with [name] pain level of 0 throughout and [name] pain level of 0 following the procedure. Post Debridement Measurements: 9cm length x 5cm width x 0.2cm depth; 7.069cm^3 volume. Post debridement Stage noted as Category/Stage II. Character of Wound/Ulcer Post Debridement is improved. Post procedure Diagnosis Wound #4: Same as Pre-Procedure General Notes: Scribed for Dr. Celine Ahr by J.Scotton. Plan Follow-up Appointments: Return appointment in 1 month. - Dr. Celine Ahr Room 3 WOUND #1: - Foot Wound Laterality: Dorsal, Right, Lateral Cleanser: Normal Saline Every Other Day/30 Days Discharge Instructions: Cleanse the wound with Normal Saline prior to applying [name] clean dressing using gauze sponges, not tissue or cotton balls. Cleanser: Soap and Water Every Other Day/30 Days Discharge Instructions: May shower and wash wound with dial antibacterial soap and water prior to dressing change. Cleanser: Wound Cleanser Every Other Day/30 Days Discharge Instructions: Cleanse the wound with wound cleanser prior to applying [name] clean dressing using gauze sponges, not tissue or cotton  balls. Prim Dressing: Sorbalgon AG Dressing, 4x4 (in/in) Every Other Day/30 Days ary Discharge Instructions: Apply to wound bed as instructed Secondary Dressing: ALLEVYN Gentle Border, 4x4 (in/in) Every Other Day/30 Days Discharge Instructions: Apply over primary dressing as directed. WOUND #2: - Trochanter Wound Laterality: Left Cleanser: Normal Saline 1 x Per Day/30 Days Discharge Instructions: Cleanse the wound with Normal Saline prior to applying [name] clean dressing using gauze sponges, not tissue or cotton balls. Cleanser: Wound Cleanser 1 x Per Day/30 Days Discharge Instructions: Cleanse the wound with wound cleanser prior to applying [name] clean dressing using gauze sponges, not tissue or cotton  balls. Secondary Dressing: ABD Pad, 8x10 1 x Per Day/30 Days Discharge Instructions: Apply over primary dressing as directed. Secured With: [employer], 4.5x3.1 (in/yd) 1 x Per Day/30 Days Discharge Instructions: Saline Moistened kerlix Secured With: 84M Medipore Soft Cloth Surgical T 2x10 (in/yd) 1 x Per Day/30 Days ape Discharge Instructions: Secure with tape as directed. WOUND #3: - Gluteal fold Wound Laterality: Left Cleanser: Normal Saline 1 x Per Day/30 Days Discharge Instructions: Cleanse the wound with Normal Saline prior to applying [name] clean dressing using gauze sponges, not tissue or cotton balls. Cleanser: Wound Cleanser 1 x Per Day/30 Days Discharge Instructions: Cleanse the wound with wound cleanser prior to applying [name] clean dressing using gauze sponges, not tissue or cotton balls. Secondary Dressing: ABD Pad, 8x10 1 x Per Day/30 Days Discharge Instructions: Apply over primary dressing as directed. Secured With: [employer], 4.5x3.1 (in/yd) 1 x Per Day/30 Days Discharge Instructions: Saline Moistened kerlix Secured With: 84M Medipore Soft Cloth Surgical T 2x10 (in/yd) 1 x Per Day/30 Days ape Discharge Instructions: Secure with tape as directed. WOUND #4: - Gluteal fold Wound Laterality: Right Cleanser: Normal Saline Every Other Day/30 Days Discharge Instructions: Cleanse the wound with Normal Saline prior to applying [name] clean dressing using gauze sponges, not tissue or cotton balls. Cleanser: Soap and Water Every Other Day/30 Days Discharge Instructions: May shower and wash wound with dial antibacterial soap and water prior to dressing change. Cleanser: Wound Cleanser Every Other Day/30 Days Discharge Instructions: Cleanse the wound with wound cleanser prior to applying [name] clean dressing using gauze sponges, not tissue or cotton balls. Prim Dressing: Sorbalgon AG Dressing 6x6 (in/in) Every Other Day/30 Days ary Discharge Instructions: Apply to wound bed as  instructed Secondary Dressing: ALLEVYN Gentle Border, 5x5 (in/in) Every Other Day/30 Days Discharge Instructions: Apply over primary dressing as directed. 03/07/2022: This is [name] 52 year old man with lumbar spina bifida who has had [name] very complicated history involving septic arthritis and gluteal abscesses. He is here to address some of these wounds. He has 2 sinus tracts, 1 on his left trochanter and 1 on his left ischium. These tunnel for [name] length of 8.5 and 10 cm respectively. The trochanter wound actually tracks all the way to the skin of his perineum and comes through. The 2 tunnels approximate each other but Anthony not directly connect. He has [name] large ulcer on his right ischium that he says has been present for 12 years. He also has an ulcer on his dorsal lateral right foot with some slough accumulation I used [name] curette to debride slough off of the right ischial ulcer and right dorsal foot ulcer. The patient is actually doing [name] remarkable job managing these complicated tracts and sinuses. I am honestly not sure how much we are able to offer him regarding these particular wounds. He says that he is interested in going  to [facility] sometime in the fall to explore more complex tissue rearrangement surgical procedures to address all of the tracts and tunnels that are present, but he is not ready to Anthony this yet. I Anthony think that may be his best option going forward. For now, he will continue to pack all of the tunnels with saline moistened gauze. We will apply silver alginate to the right ischial and right dorsal foot ulcers. Per his request, he will follow-up in 1 month. Electronic Signature(s) Signed: 03/11/2022 8:37:44 AM By: Fredirick Maudlin MD FACS Signed: 03/11/2022 4:50:49 PM By: Deon Pilling RN, BSN Previous Signature: 03/07/2022 11:11:49 AM Version By: Fredirick Maudlin MD FACS Entered By: Deon Pilling on 03/11/2022 07:54:47 Anthony [name], Anthony [name] ([id number]) [id number] Page  9 of 11 -------------------------------------------------------------------------------- HxROS Details Patient Name: Date of Service: Anthony [name], Anthony [name]. 03/07/2022 8:00 [name] M Medical Record Number: [id number] Patient Account Number: [id number] Date of Birth/Sex: Treating RN: [birth year] (52 y.o. Collene Gobble Primary Care Provider: Precious Gilding Other Clinician: Referring Provider: Treating Provider/Extender: Georg Ruddle in Treatment: 0 Information Obtained From Patient Constitutional Symptoms ([place]) Complaints and Symptoms: Positive for: Fever - Pt. reports 100F orally last night Integumentary (Skin) Complaints and Symptoms: Positive for: Wounds - Right Lateral foot Cardiovascular Medical History: Positive for: Arrhythmia - Hx: PVCs Past Medical History Notes: Hx: Endocarditis of Mitral Valve Gastrointestinal Medical History: Past Medical History Notes: Ishiorectal Abscess Slow Transit Constipation Genitourinary Medical History: Past Medical History Notes: Chronic indwelling Foley Catheter Frequent UTIs Immunological Musculoskeletal Medical History: Positive for: Osteoarthritis - Arthritis of Hip; Osteomyelitis - Left Hip (08/23) Neurologic Medical History: Positive for: Paraplegia Past Medical History Notes: Spina Bifida Oncologic Immunizations Pneumococcal Vaccine: Received Pneumococcal Vaccination: No Implantable Devices No devices added Hospitalization / Surgery History Type of Hospitalization/Surgery IandD debridements Left Hip; [name] without cardioversion Anthony [name], Anthony [name] ([id number]) [id number] Page 10 of 8 Family and Social History Unknown History: Yes; Former smoker; Marital Status - Single; Alcohol Use: Never; Drug Use: No History; Caffeine Use: Never; Financial Concerns: No; Food, Clothing or Shelter Needs: No; Support System Lacking: No; Transportation Concerns: No Electronic  Signature(s) Signed: 03/07/2022 11:29:12 AM By: Fredirick Maudlin MD FACS Signed: 03/07/2022 5:49:48 PM By: Dellie Catholic RN Entered By: Dellie Catholic on 03/07/2022 08:35:27 -------------------------------------------------------------------------------- SuperBill Details Patient Name: Date of Service: Anthony [name], Anthony [name]. 03/07/2022 Medical Record Number: [id number] Patient Account Number: [id number] Date of Birth/Sex: Treating RN: 1970-11-18 (52 y.o. M) Primary Care Provider: Precious Gilding Other Clinician: Referring Provider: Treating Provider/Extender: Georg Ruddle in Treatment: 0 Diagnosis Coding ICD-10 Codes Code Description [phone] Pressure ulcer of left hip, stage 4 L89.324 Pressure ulcer of left buttock, stage 4 L89.313 Pressure ulcer of right buttock, stage 3 L97.512 Non-pressure chronic ulcer of other part of right foot with fat layer exposed M86.452 Chronic osteomyelitis with draining sinus, left femur M00.9 Pyogenic arthritis, unspecified Q05.7 Lumbar spina bifida without hydrocephalus Facility Procedures : CPT4 Code: [id number] Description: 99214 - WOUND CARE VISIT-LEV 4 EST PT Modifier: 25 Quantity: 1 : CPT4 Code: [id number] Description: [id number] - DEBRIDE WOUND 1ST 20 SQ CM OR < ICD-10 Diagnosis Description L89.313 Pressure ulcer of right buttock, stage 3 L97.512 Non-pressure chronic ulcer of other part of right foot with fat layer exposed Modifier: Quantity: 1 : CPT4 Code: [id number] Description: [id number] - DEBRIDE WOUND EA ADDL 20 SQ CM ICD-10 Diagnosis Description L89.313 Pressure ulcer of right buttock, stage 3 L97.512 Non-pressure chronic ulcer of other  part of right foot with fat layer exposed Modifier: Quantity: 2 Physician Procedures : CPT4 Code Description Modifier [id number] - WC PHYS LEVEL 4 - NEW PT 25 ICD-10 Diagnosis Description L89.224 Pressure ulcer of left hip, stage 4 L89.324 Pressure ulcer of left buttock, stage 4 L89.313  Pressure ulcer of right buttock, stage 3 L97.512  Non-pressure chronic ulcer of other part of right foot with fat layer exposed Quantity: 1 : [id number] - WC PHYS DEBR WO ANESTH 20 SQ CM ICD-10 Diagnosis Description L89.313 Pressure ulcer of right buttock, stage 3 L97.512 Non-pressure chronic ulcer of other part of right foot with fat layer exposed Quantity: 1 : [id number] - WC PHYS DEBR WO ANESTH EA ADD 20 CM ICD-10 Diagnosis Description Anthony [name], Anthony [name] ([id number]) [id number] L89.313 Pressure ulcer of right buttock, stage 3 L97.512 Non-pressure chronic ulcer of other part of right foot  with fat layer exposed Quantity: 2 [id number].pdf Page 11 of 11 Electronic Signature(s) Signed: 03/07/2022 5:49:48 PM By: Dellie Catholic RN Signed: 03/10/2022 7:35:28 AM By: Fredirick Maudlin MD FACS Previous Signature: 03/07/2022 11:12:21 AM Version By: Fredirick Maudlin MD FACS Entered By: Dellie Catholic on 03/07/2022 17:29:18

## 2022-03-08 NOTE — Progress Notes (Signed)
KAHLE, REASER A ([id number]) [id number] Page 1 of 4 Visit Report for 03/07/2022 Abuse Risk Screen Details Patient Name: Date of Service: BRO WN, DO NA LD A. 03/07/2022 8:00 A M Medical Record Number: [id number] Patient Account Number: [id number] Date of Birth/Sex: Treating RN: 08/04/1970 (52 y.o. Collene Gobble Primary Care Emonni Depasquale: Precious Gilding Other Clinician: Referring Exilda Wilhite: Treating Denitra Donaghey/Extender: Georg Ruddle in Treatment: 0 Abuse Risk Screen Items Answer ABUSE RISK SCREEN: Has anyone close to you tried to hurt or harm you recentlyo No Do you feel uncomfortable with anyone in your familyo No Has anyone forced you do things that you didnt want to doo No Electronic Signature(s) Signed: 03/07/2022 5:49:48 PM By: Dellie Catholic RN Entered By: Dellie Catholic on 03/07/2022 08:35:38 -------------------------------------------------------------------------------- Activities of Daily Living Details Patient Name: Date of Service: BRO WN, DO NA LD A. 03/07/2022 8:00 A M Medical Record Number: [id number] Patient Account Number: [id number] Date of Birth/Sex: Treating RN: August 31, 1970 (52 y.o. Collene Gobble Primary Care Lief Palmatier: Precious Gilding Other Clinician: Referring Gery Sabedra: Treating Casilda Pickerill/Extender: Georg Ruddle in Treatment: 0 Activities of Daily Living Items Answer Activities of Daily Living (Please select one for each item) Drive Automobile Not Able T Medications ake Completely Able Use T elephone Completely Able Care for Appearance Completely Able Use T oilet Completely Able Bath / Shower Completely Able Dress Self Completely Able Feed Self Completely Able Walk Completely Able Get In / Out Bed Completely Able Housework Completely Able Prepare Meals Completely Able Handle Money Completely Able Shop for Self Completely Able Electronic Signature(s) Signed: 03/07/2022 5:49:48  PM By: Dellie Catholic RN Entered By: Dellie Catholic on 03/07/2022 08:36:11 ELBIN, PLUCINSKI A ([id number]) [id number] Page 2 of 4 -------------------------------------------------------------------------------- Education Screening Details Patient Name: Date of Service: BRO WN, DO NA LD A. 03/07/2022 8:00 A M Medical Record Number: [id number] Patient Account Number: [id number] Date of Birth/Sex: Treating RN: October 12, 1970 (52 y.o. Collene Gobble Primary Care Shyasia Funches: Precious Gilding Other Clinician: Referring Season Astacio: Treating Jessejames Steelman/Extender: Georg Ruddle in Treatment: 0 Learning Preferences/Education Level/Primary Language Learning Preference: Explanation, Demonstration Highest Education Level: College or Above Preferred Language: English Cognitive Barrier Language Barrier: No Translator Needed: No Memory Deficit: No Emotional Barrier: No Cultural/Religious Beliefs Affecting Medical Care: No Physical Barrier Impaired Vision: No Impaired Hearing: No Decreased Hand dexterity: No Knowledge/Comprehension Knowledge Level: High Comprehension Level: High Ability to understand written instructions: High Ability to understand verbal instructions: High Motivation Anxiety Level: Calm Cooperation: Cooperative Education Importance: Acknowledges Need Interest in Health Problems: Asks Questions Perception: Coherent Willingness to Engage in Self-Management High Activities: Readiness to Engage in Self-Management High Activities: Electronic Signature(s) Signed: 03/07/2022 5:49:48 PM By: Dellie Catholic RN Entered By: Dellie Catholic on 03/07/2022 08:36:46 -------------------------------------------------------------------------------- Fall Risk Assessment Details Patient Name: Date of Service: BRO WN, DO NA LD A. 03/07/2022 8:00 A M Medical Record Number: [id number] Patient Account Number: [id number] Date of Birth/Sex: Treating  RN: [birth year] (52 y.o. Collene Gobble Primary Care Jenasis Straley: Precious Gilding Other Clinician: Referring Kelcey Wickstrom: Treating Riyan Haile/Extender: Georg Ruddle in Treatment: 0 Fall Risk Assessment Items Have you had 2 or more falls in the last 12 monthso 0 No Have you had any fall that resulted in injury in the last 12 monthso 0 No SHAYLEN, KRUGMAN A ([id number]) [id number] Page 3 of 4 FALLS RISK SCREEN History of falling - immediate or within 3 months 0 No Secondary diagnosis (Do you have 2 or more medical diagnoseso) 0  No Ambulatory aid None/bed rest/wheelchair/nurse 0 No Crutches/cane/walker 0 No Furniture 0 No Intravenous therapy Access/Saline/Heparin Lock 0 No Gait/Transferring Normal/ bed rest/ wheelchair 0 No Weak (short steps with or without shuffle, stooped but able to lift head while walking, may seek 0 No support from furniture) Impaired (short steps with shuffle, may have difficulty arising from chair, head down, impaired 0 No balance) Mental Status Oriented to own ability 0 No Electronic Signature(s) Signed: 03/07/2022 5:49:48 PM By: Dellie Catholic RN Entered By: Dellie Catholic on 03/07/2022 08:37:24 -------------------------------------------------------------------------------- Foot Assessment Details Patient Name: Date of Service: BRO WN, DO NA LD A. 03/07/2022 8:00 A M Medical Record Number: [id number] Patient Account Number: [id number] Date of Birth/Sex: Treating RN: 1970/12/03 (52 y.o. Collene Gobble Primary Care Blayre Papania: Precious Gilding Other Clinician: Referring Mylin Hirano: Treating Olga Seyler/Extender: Georg Ruddle in Treatment: 0 Foot Assessment Items Site Locations + = Sensation present, - = Sensation absent, C = Callus, U = Ulcer R = Redness, W = Warmth, M = Maceration, PU = Pre-ulcerative lesion F = Fissure, S = Swelling, D = Dryness Assessment Right: Left: Other  Deformity: Yes No Prior Foot Ulcer: No No Prior Amputation: Yes No Charcot Joint: No No Ambulatory Status: Non-ambulatory Assistance Device: Wheelchair GaitJOHNCHRISTIAN, PAGNI A ([id number]) [phone].pdf Page 4 of 4 Notes Right foot T 2,3,4,5, are amputated oes Electronic Signature(s) Signed: 03/07/2022 5:49:48 PM By: Dellie Catholic RN Entered By: Dellie Catholic on 03/07/2022 08:40:00 -------------------------------------------------------------------------------- Nutrition Risk Screening Details Patient Name: Date of Service: BRO WN, DO NA LD A. 03/07/2022 8:00 A M Medical Record Number: [id number] Patient Account Number: [id number] Date of Birth/Sex: Treating RN: November 13, 1970 (52 y.o. Collene Gobble Primary Care Janelli Welling: Precious Gilding Other Clinician: Referring Lynn Recendiz: Treating Kem Parcher/Extender: Georg Ruddle in Treatment: 0 Height (in): 68 Weight (lbs): 165 Body Mass Index (BMI): 25.1 Nutrition Risk Screening Items Score Screening NUTRITION RISK SCREEN: I have an illness or condition that made me change the kind and/or amount of food I eat 0 No I eat fewer than two meals per day 0 No I eat few fruits and vegetables, or milk products 0 No I have three or more drinks of beer, liquor or wine almost every day 0 No I have tooth or mouth problems that make it hard for me to eat 0 No I don't always have enough money to buy the food I need 0 No I eat alone most of the time 0 No I take three or more different prescribed or over-the-counter drugs a day 0 No Without wanting to, I have lost or gained 10 pounds in the last six months 0 No I am not always physically able to shop, cook and/or feed myself 0 No Nutrition Protocols Good Risk Protocol 0 No interventions needed Moderate Risk Protocol High Risk Proctocol Risk Level: Good Risk Score: 0 Electronic Signature(s) Signed: 03/07/2022 5:49:48 PM By: Dellie Catholic  RN Entered By: Dellie Catholic on 03/07/2022 08:37:37

## 2022-03-08 NOTE — Progress Notes (Signed)
Anthony [name], Anthony [name] ([id number]) [id number] Page 1 of 14 Visit Report for 03/07/2022 Allergy List Details Patient Name: Date of Service: BRO WN, DO NA LD [name]. 03/07/2022 8:00 [name] M Medical Record Number: [id number] Patient Account Number: [id number] Date of Birth/Sex: Treating RN: 02-27-1970 (52 y.o. Anthony [name] Primary Care Wylee Dorantes: Precious Gilding Other Clinician: Referring Mechel Haggard: Treating Elnora Quizon/Extender: Georg Ruddle in Treatment: 0 Allergies Active Allergies Vancomycin Severity: Severe Type: Medication Allergy Notes Electronic Signature(s) Signed: 03/07/2022 5:49:48 PM By: Dellie Catholic RN Entered By: Dellie Catholic on 03/07/2022 08:25:51 -------------------------------------------------------------------------------- Arrival Information Details Patient Name: Date of Service: BRO WN, DO NA LD [name]. 03/07/2022 8:00 [name] M Medical Record Number: [id number] Patient Account Number: [id number] Date of Birth/Sex: Treating RN: 1970-03-13 (52 y.o. Anthony [name] Primary Care Suzetta Timko: Precious Gilding Other Clinician: Referring Patrice Matthew: Treating Kenitra Leventhal/Extender: Georg Ruddle in Treatment: 0 Visit Information Patient Arrived: Wheel Chair Arrival Time: 08:09 Accompanied By: self Transfer Assistance: None Patient Identification Verified: Yes Electronic Signature(s) Signed: 03/07/2022 5:49:48 PM By: Dellie Catholic RN Entered By: Dellie Catholic on 03/07/2022 08:23:11 -------------------------------------------------------------------------------- Clinic Level of Care Assessment Details Patient Name: Date of Service: BRO WN, DO NA LD [name]. 03/07/2022 8:00 [name] M Medical Record Number: [id number] Patient Account Number: [id number] Date of Birth/Sex: Treating RN: [birth year] (52 y.o. Anthony, Baggarly, [name] ([id number]) [phone].pdf Page 2 of 14 Primary Care Christne Platts: Precious Gilding  Other Clinician: Referring Chanae Gemma: Treating Cruise Baumgardner/Extender: Georg Ruddle in Treatment: 0 Clinic Level of Care Assessment Items TOOL 1 Quantity Score X- 1 0 Use when EandM and Procedure is performed on INITIAL visit ASSESSMENTS - Nursing Assessment / Reassessment X- 1 20 General Physical Exam (combine w/ comprehensive assessment (listed just below) when performed on new pt. evals) X- 1 25 Comprehensive Assessment (HX, ROS, Risk Assessments, Wounds Hx, etc.) ASSESSMENTS - Wound and Skin Assessment / Reassessment X- 1 10 Dermatologic / Skin Assessment (not related to wound area) ASSESSMENTS - Ostomy and/or Continence Assessment and Care []$  - 0 Incontinence Assessment and Management []$  - 0 Ostomy Care Assessment and Management (repouching, etc.) PROCESS - Coordination of Care []$  - 0 Simple Patient / Family Education for ongoing care X- 1 20 Complex (extensive) Patient / Family Education for ongoing care X- 1 10 Staff obtains Programmer, systems, Records, T Results / Process Orders est X- 1 10 Staff telephones HHA, Nursing Homes / Clarify orders / etc []$  - 0 Routine Transfer to another Facility (non-emergent condition) []$  - 0 Routine Hospital Admission (non-emergent condition) X- 1 15 New Admissions / Biomedical engineer / Ordering NPWT Apligraf, etc. , []$  - 0 Emergency Hospital Admission (emergent condition) PROCESS - Special Needs []$  - 0 Pediatric / Minor Patient Management []$  - 0 Isolation Patient Management []$  - 0 Hearing / Language / Visual special needs X- 1 15 Assessment of Community assistance (transportation, D/C planning, etc.) []$  - 0 Additional assistance / Altered mentation []$  - 0 Support Surface(s) Assessment (bed, cushion, seat, etc.) INTERVENTIONS - Miscellaneous []$  - 0 External ear exam []$  - 0 Patient Transfer (multiple staff / Civil Service fast streamer / Similar devices) []$  - 0 Simple Staple / Suture removal (25 or less) []$  - 0 Complex  Staple / Suture removal (26 or more) []$  - 0 Hypo/Hyperglycemic Management (do not check if billed separately) []$  - 0 Ankle / Brachial Index (ABI) - do not check if billed separately Has the patient been seen at the hospital within the last three years: Yes  Total Score: 125 Level Of Care: New/Established - Level 4 Electronic Signature(s) Signed: 03/07/2022 5:49:48 PM By: Dellie Catholic RN Entered By: Dellie Catholic on 03/07/2022 17:28:53 BRENTYN, SKOOG [name] ([id number]) [id number] Page 3 of 14 -------------------------------------------------------------------------------- Encounter Discharge Information Details Patient Name: Date of Service: BRO WN, DO NA LD [name]. 03/07/2022 8:00 [name] M Medical Record Number: [id number] Patient Account Number: [id number] Date of Birth/Sex: Treating RN: 1970/11/11 (52 y.o. Anthony [name] Primary Care Chelsae Zanella: Precious Gilding Other Clinician: Referring Erdem Naas: Treating Claritza July/Extender: Georg Ruddle in Treatment: 0 Encounter Discharge Information Items Post Procedure Vitals Discharge Condition: Stable Temperature (F): 99.1 Ambulatory Status: Wheelchair Pulse (bpm): 93 Discharge Destination: Home Respiratory Rate (breaths/min): 16 Transportation: Private Auto Blood Pressure (mmHg): 129/76 Accompanied By: self Schedule Follow-up Appointment: Yes Clinical Summary of Care: Patient Declined Electronic Signature(s) Signed: 03/07/2022 5:49:48 PM By: Dellie Catholic RN Entered By: Dellie Catholic on 03/07/2022 17:43:34 -------------------------------------------------------------------------------- Lower Extremity Assessment Details Patient Name: Date of Service: BRO WN, DO NA LD [name]. 03/07/2022 8:00 [name] M Medical Record Number: [id number] Patient Account Number: [id number] Date of Birth/Sex: Treating RN: 1970-12-01 (52 y.o. Anthony [name] Primary Care Ronon Ferger: Precious Gilding Other Clinician: Referring  Selma Rodelo: Treating Shuronda Santino/Extender: Georg Ruddle in Treatment: 0 Electronic Signature(s) Signed: 03/07/2022 5:49:48 PM By: Dellie Catholic RN Entered By: Dellie Catholic on 03/07/2022 08:40:26 -------------------------------------------------------------------------------- Multi Wound Chart Details Patient Name: Date of Service: BRO WN, DO NA LD [name]. 03/07/2022 8:00 [name] M Medical Record Number: [id number] Patient Account Number: [id number] Date of Birth/Sex: Treating RN: 02-14-1970 (52 y.o. M) Primary Care Mahathi Pokorney: Precious Gilding Other Clinician: Referring Langford Carias: Treating Solenne Manwarren/Extender: Georg Ruddle in Treatment: 0 Vital Signs Height(in): 68 Pulse(bpm): 93 Weight(lbs): 165 Blood Pressure(mmHg): 129/76 Body Mass Index(BMI): 25.1 Temperature(F): 99.1 Respiratory Rate(breaths/min): 16 [1:Photos:] [id number].pdf Page 4 of 14] Right, Lateral, Dorsal Foot Left Trochanter Left Gluteal fold Wound Location: Other Lesion Pressure Injury Gradually Appeared Wounding Event: Lesion Infection - not elsewhere classified Pressure Ulcer Primary Etiology: Arrhythmia, Osteoarthritis, Arrhythmia, Osteoarthritis, Arrhythmia, Osteoarthritis, Comorbid History: Osteomyelitis, Paraplegia Osteomyelitis, Paraplegia Osteomyelitis, Paraplegia 07/26/2021 08/27/2021 08/27/2021 Date Acquired: 0 0 0 Weeks of Treatment: Open Open Open Wound Status: No No No Wound Recurrence: 5x2x0.1 1.5x1x8.5 1x1.5x10 Measurements L x W x D (cm) 7.854 1.178 1.178 [name] (cm) : rea 0.785 10.014 11.781 Volume (cm) : 6 Position 1 (o'clock): 8 Maximum Distance 1 (cm): No Yes No Tunneling: Full Thickness Without Exposed Full Thickness Without Exposed Category/Stage III Classification: Support Structures Support Structures Medium Medium Medium Exudate [name] mount: Serosanguineous Serosanguineous Serosanguineous Exudate Type: red, Wayne red, Mccabe  red, [place] Exudate Color: Small (1-33%) Small (1-33%) Small (1-33%) Granulation [name] mount: Pink Pink Red Granulation Quality: Large (67-100%) Large (67-100%) Large (67-100%) Necrotic [name] mount: Eschar, Adherent Slough Adherent [facility], Adherent Slough Necrotic Tissue: Small (1-33%) None None Epithelialization: Debridement - Selective/Open Wound N/[name] N/[name] Debridement: Pre-procedure Verification/Time Out 09:20 N/[name] N/[name] Taken: Slough N/[name] N/[name] Tissue Debrided: Non-Viable Tissue N/[name] N/[name] Level: 10 N/[name] N/[name] Debridement [name] (sq cm): rea Curette N/[name] N/[name] Instrument: Minimum N/[name] N/[name] Bleeding: Pressure N/[name] N/[name] Hemostasis [name] chieved: 0 N/[name] N/[name] Procedural Pain: 0 N/[name] N/[name] Post Procedural Pain: Procedure was tolerated well N/[name] N/[name] Debridement Treatment Response: 5x2x0.1 N/[name] N/[name] Post Debridement Measurements L x W x D (cm) 0.785 N/[name] N/[name] Post Debridement Volume: (cm) N/[name] N/[name] N/[name] Post Debridement Stage: Scarring: Yes No Abnormalities Noted Scarring: Yes Periwound Skin Texture: No Abnormalities Noted No Abnormalities Noted Periwound Skin Moisture: No Abnormalities  Noted No Abnormalities Noted Periwound Skin Color: N/[name] No Abnormality No Abnormality Temperature: Clustered wound N/[name] N/[name] Assessment Notes: Debridement N/[name] N/[name] Procedures Performed: Wound Number: 4 N/[name] N/[name] Photos: N/[name] N/[name] Right Gluteal fold N/[name] N/[name] Wound Location: Gradually Appeared N/[name] N/[name] Wounding Event: Pressure Ulcer N/[name] N/[name] Primary Etiology: Arrhythmia, Osteoarthritis, N/[name] N/[name] Comorbid History: Osteomyelitis, Paraplegia 12/28/2021 N/[name] N/[name] Date Acquired: 0 N/[name] N/[name] Weeks of Treatment: Open N/[name] N/[name] Wound Status: No N/[name] N/[name] Wound Recurrence: 9x5x0.2 N/[name] N/[name] Measurements L x W x D (cm) 35.343 N/[name] N/[name] [name] (cm) : rea 7.069 N/[name] N/[name] Volume (cm) : No N/[name] N/[name] Tunneling: Category/Stage II N/[name] N/[name] Classification: Medium N/[name] N/[name] Exudate [name] mount: Serosanguineous N/[name] N/[name] Exudate Type: red, [place] N/[name]  N/[name] Exudate Color: Medium (34-66%) N/[name] N/[name] Granulation [name] mount: Red N/[name] N/[name] Granulation QualityDANNIAL, DRENNEN [name] ([id number]) [id number] Page 5 of 14 Medium (34-66%) N/[name] N/[name] Necrotic Amount: Adherent Slough N/[name] N/[name] Necrotic Tissue: Fat Layer (Subcutaneous Tissue): Yes N/[name] N/[name] Exposed Structures: Fascia: No Tendon: No Muscle: No Joint: No Bone: No None N/[name] N/[name] Epithelialization: Debridement - Selective/Open Wound N/[name] N/[name] Debridement: Pre-procedure Verification/Time Out 09:20 N/[name] N/[name] Taken: Slough N/[name] N/[name] Tissue Debrided: Non-Viable Tissue N/[name] N/[name] Level: 45 N/[name] N/[name] Debridement [name] (sq cm): rea Curette N/[name] N/[name] Instrument: Minimum N/[name] N/[name] Bleeding: Pressure N/[name] N/[name] Hemostasis [name] chieved: 0 N/[name] N/[name] Procedural Pain: 0 N/[name] N/[name] Post Procedural Pain: Procedure was tolerated well N/[name] N/[name] Debridement Treatment Response: 9x5x0.2 N/[name] N/[name] Post Debridement Measurements L x W x D (cm) 7.069 N/[name] N/[name] Post Debridement Volume: (cm) Category/Stage II N/[name] N/[name] Post Debridement Stage: Scarring: Yes N/[name] N/[name] Periwound Skin Texture: No Abnormalities Noted N/[name] N/[name] Periwound Skin Moisture: No Abnormalities Noted N/[name] N/[name] Periwound Skin Color: No Abnormality N/[name] N/[name] Temperature: N/[name] N/[name] N/[name] Assessment Notes: Debridement N/[name] N/[name] Procedures Performed: Treatment Notes Electronic Signature(s) Signed: 03/07/2022 11:02:58 AM By: Fredirick Maudlin MD FACS Entered By: Fredirick Maudlin on 03/07/2022 11:02:58 -------------------------------------------------------------------------------- Multi-Disciplinary Care Plan Details Patient Name: Date of Service: BRO WN, DO NA LD [name]. 03/07/2022 8:00 [name] M Medical Record Number: [id number] Patient Account Number: [id number] Date of Birth/Sex: Treating RN: Nov 29, 1970 (52 y.o. Anthony [name] Primary Care Curry Dulski: Precious Gilding Other Clinician: Referring Alexiss Iturralde: Treating Tekila Caillouet/Extender: Georg Ruddle in Treatment: 0 Active Inactive Wound/Skin Impairment Nursing Diagnoses: Impaired tissue integrity Goals: Patient/caregiver will verbalize understanding of skin care regimen Date Initiated: 03/07/2022 Target Resolution Date: 09/20/2022 Goal Status: Active Interventions: Assess ulceration(s) every visit Treatment Activities: Skin care regimen initiated : 03/07/2022 Notes: Electronic Signature(s) JOSTYN, GARVERICK [name] ([id number]) [id number] Page 6 of 14 Signed: 03/07/2022 5:49:48 PM By: Dellie Catholic RN Entered By: Dellie Catholic on 03/07/2022 17:26:53 -------------------------------------------------------------------------------- Pain Assessment Details Patient Name: Date of Service: BRO WN, DO NA LD [name]. 03/07/2022 8:00 [name] M Medical Record Number: [id number] Patient Account Number: [id number] Date of Birth/Sex: Treating RN: [birth year] (52 y.o. Anthony [name] Primary Care Dorothey Oetken: Precious Gilding Other Clinician: Referring Wilburt Messina: Treating Talis Iwan/Extender: Georg Ruddle in Treatment: 0 Active Problems Location of Pain Severity and Description of Pain Patient Has Paino No Site Locations Pain Management and Medication Current Pain Management: Electronic Signature(s) Signed: 03/07/2022 5:49:48 PM By: Dellie Catholic RN Entered By: Dellie Catholic on 03/07/2022 09:19:16 -------------------------------------------------------------------------------- Patient/Caregiver Education Details Patient Name: Date of Service: Sherrill Raring, DO NA LD [name]. 2/16/2024andnbsp8:00 [name] M Medical Record Number: [id number] Patient Account Number: [id number] Date of Birth/Gender: Treating RN: 12-27-1970 (52 y.o. Anthony [name] Primary Care Physician:  Precious Gilding Other Clinician: Referring Physician: Treating Physician/Extender: Georg Ruddle in Treatment: 0 Education Assessment Education Provided To: Patient DELSIN, ALIRES ([id number]) [id number] Page 7 of 14 Education Topics Provided Wound/Skin Impairment: Methods: Explain/Verbal Responses: Return demonstration correctly Electronic Signature(s) Signed: 03/07/2022 5:49:48 PM By: Dellie Catholic RN Entered By: Dellie Catholic on 03/07/2022 17:27:05 -------------------------------------------------------------------------------- Wound Assessment Details Patient Name: Date of Service: BRO WN, DO NA LD [name]. 03/07/2022 8:00 [name] M Medical Record Number: [id number] Patient Account Number: [id number] Date of Birth/Sex: Treating RN: 04-20-1970 (52 y.o. Anthony [name] Primary Care Tyreisha Ungar: Precious Gilding Other Clinician: Referring [place] Latulippe: Treating Wang Granada/Extender: Georg Ruddle in Treatment: 0 Wound Status Wound Number: 1 Primary Etiology: Lesion Wound Location: Right, Lateral, Dorsal Foot Wound Status: Open Wounding Event: Other Lesion Comorbid History: Arrhythmia, Osteoarthritis, Osteomyelitis, Paraplegia Date Acquired: 07/26/2021 Weeks Of Treatment: 0 Clustered Wound: No Photos Wound Measurements Length: (cm) 5 Width: (cm) 2 Depth: (cm) 0.1 Area: (cm) 7.854 Volume: (cm) 0.785 % Reduction in Area: % Reduction in Volume: Epithelialization: Small (1-33%) Tunneling: No Undermining: No Wound Description Classification: Full Thickness Without Exposed Support Exudate Amount: Medium Exudate Type: Serosanguineous Exudate Color: red, Lill Structures Foul Odor After Cleansing: No Slough/Fibrino Yes Wound Bed Granulation Amount: Small (1-33%) Granulation Quality: Pink Necrotic Amount: Large (67-100%) Necrotic Quality: Eschar, Adherent Slough Periwound Skin Texture Texture Color No Abnormalities Noted: No No Abnormalities Noted: No ScarringREQUAN, CALDARELLI [name] ([id number]) [id number] Page 8 of 14 Moisture No Abnormalities Noted: No Assessment Notes Clustered  wound Treatment Notes Wound #1 (Foot) Wound Laterality: Dorsal, Right, Lateral Cleanser Normal Saline Discharge Instruction: Cleanse the wound with Normal Saline prior to applying [name] clean dressing using gauze sponges, not tissue or cotton balls. Soap and Water Discharge Instruction: May shower and wash wound with dial antibacterial soap and water prior to dressing change. Wound Cleanser Discharge Instruction: Cleanse the wound with wound cleanser prior to applying [name] clean dressing using gauze sponges, not tissue or cotton balls. Peri-Wound Care Topical Primary Dressing Sorbalgon AG Dressing, 4x4 (in/in) Discharge Instruction: Apply to wound bed as instructed Secondary Dressing ALLEVYN Gentle Border, 4x4 (in/in) Discharge Instruction: Apply over primary dressing as directed. Secured With Compression Wrap Compression Stockings Environmental education officer) Signed: 03/07/2022 5:49:48 PM By: Dellie Catholic RN Entered By: Dellie Catholic on 03/07/2022 08:46:38 -------------------------------------------------------------------------------- Wound Assessment Details Patient Name: Date of Service: BRO WN, DO NA LD [name]. 03/07/2022 8:00 [name] M Medical Record Number: [id number] Patient Account Number: [id number] Date of Birth/Sex: Treating RN: Mar 20, 1970 (52 y.o. Anthony [name] Primary Care [name] Fullbright: Precious Gilding Other Clinician: Referring Ayansh Feutz: Treating Jedaiah Rathbun/Extender: Georg Ruddle in Treatment: 0 Wound Status Wound Number: 2 Primary Etiology: Infection - not elsewhere classified Wound Location: Left Trochanter Wound Status: Open Wounding Event: Pressure Injury Comorbid History: Arrhythmia, Osteoarthritis, Osteomyelitis, Paraplegia Date Acquired: 08/27/2021 Weeks Of Treatment: 0 Clustered Wound: No Photos KENYON, CRAMER [name] ([id number]) [id number] Page 9 of 14 Wound Measurements Length: (cm) 1.5 Width: (cm) 1 Depth: (cm)  8.5 Area: (cm) 1.178 Volume: (cm) 10.014 % Reduction in Area: % Reduction in Volume: Epithelialization: None Tunneling: Yes Position (o'clock): 6 Maximum Distance: (cm) 8 Undermining: No Wound Description Classification: Full Thickness Without Exposed Support Exudate Amount: Medium Exudate Type: Serosanguineous Exudate Color: red, [place] Structures Foul Odor After Cleansing: No Slough/Fibrino Yes Wound Bed Granulation Amount: Small (1-33%) Exposed Structure Granulation Quality: Pink Fascia Exposed: No Necrotic Amount: Large (67-100%) Fat Layer (Subcutaneous Tissue) Exposed: Yes Necrotic Quality: Adherent Slough  Tendon Exposed: No Muscle Exposed: No Joint Exposed: No Bone Exposed: No Periwound Skin Texture Texture Color No Abnormalities Noted: Yes No Abnormalities Noted: Yes Moisture Temperature / Pain No Abnormalities Noted: Yes Temperature: No Abnormality Electronic Signature(s) Signed: 03/07/2022 5:49:48 PM By: Dellie Catholic RN Entered By: Dellie Catholic on 03/07/2022 09:35:58 -------------------------------------------------------------------------------- Wound Assessment Details Patient Name: Date of Service: BRO WN, DO NA LD [name]. 03/07/2022 8:00 [name] M Medical Record Number: [id number] Patient Account Number: [id number] Date of Birth/Sex: Treating RN: [birth year] (52 y.o. Anthony [name] Primary Care Zenya Hickam: Precious Gilding Other Clinician: Referring Kenniel Bergsma: Treating Judi Jaffe/Extender: Georg Ruddle in Treatment: 0 Wound Status Wound Number: 2 Primary Etiology: Pressure Ulcer Wound Location: Left Trochanter Wound Status: Open Wounding Event: Pressure Injury Comorbid History: Arrhythmia, Osteoarthritis, Osteomyelitis, Paraplegia Date Acquired: 08/27/2021 Weeks Of Treatment: 0 Clustered Wound: No DEUNDRA, MEHLE [name] ([id number]) [id number] Page 10 of 14 Wound Measurements Length: (cm) 1.5 Width: (cm) 1 Depth:  (cm) 8.5 Area: (cm) 1.178 Volume: (cm) 10.014 % Reduction in Area: % Reduction in Volume: Tunneling: Yes Position (o'clock): 6 Maximum Distance: (cm) 8 Wound Description Classification: Category/Stage IV Exudate Amount: Medium Exudate Type: Serosanguineous Exudate Color: red, [place] Foul Odor After Cleansing: No Slough/Fibrino Yes Wound Bed Granulation Amount: Small (1-33%) Exposed Structure Granulation Quality: Red, Hyper-granulation Fat Layer (Subcutaneous Tissue) Exposed: Yes Necrotic Amount: Large (67-100%) Necrotic Quality: Adherent Slough Periwound Skin Texture Texture Color No Abnormalities Noted: Yes No Abnormalities Noted: Yes Moisture Temperature / Pain No Abnormalities Noted: Yes Temperature: No Abnormality Treatment Notes Wound #2 (Trochanter) Wound Laterality: Left Cleanser Normal Saline Discharge Instruction: Cleanse the wound with Normal Saline prior to applying [name] clean dressing using gauze sponges, not tissue or cotton balls. Wound Cleanser Discharge Instruction: Cleanse the wound with wound cleanser prior to applying [name] clean dressing using gauze sponges, not tissue or cotton balls. Peri-Wound Care Topical Primary Dressing Secondary Dressing ABD Pad, 8x10 Discharge Instruction: Apply over primary dressing as directed. Secured With [employer], 4.5x3.1 (in/yd) Discharge Instruction: Saline Moistened kerlix 29M Medipore Soft Cloth Surgical T 2x10 (in/yd) ape Discharge Instruction: Secure with tape as directed. Compression Wrap Compression Stockings Add-Ons Electronic Signature(s) Signed: 03/07/2022 5:49:48 PM By: Dellie Catholic RN Entered By: Dellie Catholic on 03/07/2022 17:38:18 -------------------------------------------------------------------------------- Wound Assessment Details Patient Name: Date of Service: BRO WN, DO NA LD [name]. 03/07/2022 8:00 [name] M Medical Record Number: [id number] Patient Account Number: [id number] ADONNIS, PENAFLOR  ([id number]) [id number] Page 11 of 14 Date of Birth/Sex: Treating RN: [birth year] (52 y.o. Anthony [name] Primary Care Ervin Hensley: Other Clinician: Precious Gilding Referring Sherylann Vangorden: Treating Guyla Bless/Extender: Georg Ruddle in Treatment: 0 Wound Status Wound Number: 3 Primary Etiology: Pressure Ulcer Wound Location: Left Gluteal fold Wound Status: Open Wounding Event: Gradually Appeared Comorbid History: Arrhythmia, Osteoarthritis, Osteomyelitis, Paraplegia Date Acquired: 08/27/2021 Weeks Of Treatment: 0 Clustered Wound: No Photos Wound Measurements Length: (cm) 1 Width: (cm) 1.5 Depth: (cm) 10 Area: (cm) 1.178 Volume: (cm) 11.781 % Reduction in Area: % Reduction in Volume: Epithelialization: None Tunneling: No Undermining: No Wound Description Classification: Category/Stage IV Exudate Amount: Medium Exudate Type: Serosanguineous Exudate Color: red, [place] Foul Odor After Cleansing: No Slough/Fibrino Yes Wound Bed Granulation Amount: Small (1-33%) Exposed Structure Granulation Quality: Red Fat Layer (Subcutaneous Tissue) Exposed: Yes Necrotic Amount: Large (67-100%) Necrotic Quality: Eschar, Adherent Slough Periwound Skin Texture Texture Color No Abnormalities Noted: No No Abnormalities Noted: Yes Scarring: Yes Temperature / Pain Temperature: No Abnormality Moisture No Abnormalities Noted: Yes Treatment Notes Wound #  3 (Gluteal fold) Wound Laterality: Left Cleanser Normal Saline Discharge Instruction: Cleanse the wound with Normal Saline prior to applying [name] clean dressing using gauze sponges, not tissue or cotton balls. Wound Cleanser Discharge Instruction: Cleanse the wound with wound cleanser prior to applying [name] clean dressing using gauze sponges, not tissue or cotton balls. Peri-Wound Care Topical Primary Dressing Secondary Dressing ABD Pad, 8x10 Discharge Instruction: Apply over primary dressing as  directed. ZALMAN, GANSER [name] ([id number]) [id number] Page 12 of 14 Secured With [employer], 4.5x3.1 (in/yd) Discharge Instruction: Saline Moistened kerlix 35M Medipore Public affairs consultant Surgical T 2x10 (in/yd) ape Discharge Instruction: Secure with tape as directed. Compression Wrap Compression Stockings Add-Ons Electronic Signature(s) Signed: 03/07/2022 5:49:48 PM By: Dellie Catholic RN Entered By: Dellie Catholic on 03/07/2022 17:31:37 -------------------------------------------------------------------------------- Wound Assessment Details Patient Name: Date of Service: BRO WN, DO NA LD [name]. 03/07/2022 8:00 [name] M Medical Record Number: [id number] Patient Account Number: [id number] Date of Birth/Sex: Treating RN: [birth year] (52 y.o. Anthony [name] Primary Care Lamija Besse: Precious Gilding Other Clinician: Referring Audrey Eller: Treating Raequon Catanzaro/Extender: Georg Ruddle in Treatment: 0 Wound Status Wound Number: 4 Primary Etiology: Pressure Ulcer Wound Location: Right Gluteal fold Wound Status: Open Wounding Event: Gradually Appeared Comorbid History: Arrhythmia, Osteoarthritis, Osteomyelitis, Paraplegia Date Acquired: 12/28/2021 Weeks Of Treatment: 0 Clustered Wound: No Photos Wound Measurements Length: (cm) 9 Width: (cm) 5 Depth: (cm) 0.2 Area: (cm) 35.343 Volume: (cm) 7.069 % Reduction in Area: % Reduction in Volume: Epithelialization: None Tunneling: No Undermining: No Wound Description Classification: Category/Stage III Exudate Amount: Medium Exudate Type: Serosanguineous Exudate Color: red, Novelo Foul Odor After Cleansing: No Slough/Fibrino Yes Wound Bed Granulation Amount: Medium (34-66%) Exposed Structure Granulation Quality: Red Fascia Exposed: No Necrotic Amount: Medium (34-66%) Fat Layer (Subcutaneous Tissue) Exposed: Yes Necrotic Quality: Adherent Slough Tendon Exposed: No Muscle Exposed: No LUQMAAN, PAKKALA [name]  ([id number]) [id number] Page 13 of 14 Joint Exposed: No Bone Exposed: No Periwound Skin Texture Texture Color No Abnormalities Noted: No No Abnormalities Noted: Yes Scarring: Yes Temperature / Pain Temperature: No Abnormality Moisture No Abnormalities Noted: Yes Treatment Notes Wound #4 (Gluteal fold) Wound Laterality: Right Cleanser Normal Saline Discharge Instruction: Cleanse the wound with Normal Saline prior to applying [name] clean dressing using gauze sponges, not tissue or cotton balls. Soap and Water Discharge Instruction: May shower and wash wound with dial antibacterial soap and water prior to dressing change. Wound Cleanser Discharge Instruction: Cleanse the wound with wound cleanser prior to applying [name] clean dressing using gauze sponges, not tissue or cotton balls. Peri-Wound Care Topical Primary Dressing Sorbalgon AG Dressing 6x6 (in/in) Discharge Instruction: Apply to wound bed as instructed Secondary Dressing ALLEVYN Gentle Border, 5x5 (in/in) Discharge Instruction: Apply over primary dressing as directed. Secured With Compression Wrap Compression Stockings Environmental education officer) Signed: 03/07/2022 5:49:48 PM By: Dellie Catholic RN Entered By: Dellie Catholic on 03/07/2022 17:32:03 -------------------------------------------------------------------------------- Vitals Details Patient Name: Date of Service: BRO WN, DO NA LD [name]. 03/07/2022 8:00 [name] M Medical Record Number: [id number] Patient Account Number: [id number] Date of Birth/Sex: Treating RN: December 13, 1970 (52 y.o. Anthony [name] Primary Care Aundray Cartlidge: Precious Gilding Other Clinician: Referring Cathaleen Korol: Treating Brinlyn Cena/Extender: Georg Ruddle in Treatment: 0 Vital Signs Time Taken: 08:10 Temperature (F): 99.1 Height (in): 68 Pulse (bpm): 93 Weight (lbs): 165 Respiratory Rate (breaths/min): 16 Body Mass Index (BMI): 25.1 Blood Pressure (mmHg):  129/76 Reference Range: 80 - 120 mg / dl Electronic Signature(s) Signed: 03/07/2022 5:49:48 PM By: Dellie Catholic RN Entered By: Minus Liberty,  Mechele Claude on 03/07/2022 08:23:57 MARKQUIS, BRUCK [name] ([id number]) [id number] Page 14 of 14

## 2022-03-11 ENCOUNTER — Ambulatory Visit: Payer: Self-pay | Admitting: *Deleted

## 2022-03-12 NOTE — Patient Outreach (Addendum)
  Care Coordination   Follow Up Visit Note   03/12/2022 Name: KELLY MARGULIS MRN: [id number] DOB: [birth year]  Anthony [name] is a 52 y.o. year old male who sees Precious Gilding, DO for primary care. I spoke with  [name] by phone today.  What matters to the patients health and wellness today?  Need to get Medicaid and help at home.    Goals Addressed             This Visit's Progress    Get Medicaid and help at home       Activities and task to complete in order to accomplish goals.    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 Encouraged pt to seek additional ways to volunteer and give back as he does find this rewarding         SDOH assessments and interventions completed:  Yes     Care Coordination Interventions:  Yes, provided  Interventions Today    Flowsheet Row Most Recent Value  Chronic Disease   Chronic disease during today's visit Other  [paraplegia]  General Interventions   General Interventions Discussed/Reviewed Community Resources  Mental Health Interventions   Mental Health Discussed/Reviewed Coping Strategies, Anxiety  [does have anxiety and is currently not on RX for this but thinks he may need it- suggests he discuss with PCP]  Refer to Social Work for counseling regarding Anxiety/Coping  Safety Interventions   Safety Discussed/Reviewed Safety Discussed, Safety Reviewed       Follow up plan: Follow up call scheduled for 03/17/22    Encounter Outcome:  Pt. Visit Completed

## 2022-03-18 ENCOUNTER — Ambulatory Visit: Payer: Self-pay | Admitting: *Deleted

## 2022-03-19 NOTE — Patient Outreach (Signed)
  Care Coordination   Follow Up Visit Note- Late Entry   03/19/2022 Name: Anthony [name] MRN: [id number] DOB: February 08, 1970  Anthony [name] is a 52 y.o. year old male who sees Anthony Gilding, DO for primary care. I spoke with  Anthony [name] by phone today.  What matters to the patients health and wellness today?  Medicaid determination for help at home    Goals Addressed             This Visit's Progress    Get Medicaid and help at home       Activities and task to complete in order to accomplish goals.    Per DSS/Medicaid; awaiting SS Disability determination for Medicaid application to be completed Continue with compliance of taking medication prescribed by Doctor Reviewed Care Coordination Services:  Assessed Social Determinants of Health Pt denies depression- does have anxiety and is currently not on RX for this but thinks he may need it- suggests he discuss with PCP  Continue wound care         SDOH assessments and interventions completed:  Yes     Care Coordination Interventions:  Yes, provided  Interventions Today    Flowsheet Row Most Recent Value  Chronic Disease   Chronic disease during today's visit Other  General Interventions   General Interventions Discussed/Reviewed Community Resources  Mental Health Interventions   Mental Health Discussed/Reviewed Coping Strategies       Follow up plan: Follow up call scheduled for 03/28/22    Encounter Outcome:  Pt. Visit Completed

## 2022-03-19 NOTE — Patient Instructions (Signed)
Visit Information  Thank you for taking time to visit with me today. Please don't hesitate to contact me if I can be of assistance to you.   Following are the goals we discussed today:   Goals Addressed             This Visit's Progress    Get Medicaid and help at home       Activities and task to complete in order to accomplish goals.    Per DSS/Medicaid; awaiting SS Disability determination for Medicaid application to be completed Continue with compliance of taking medication prescribed by Doctor Reviewed Care Coordination Services:  Assessed Social Determinants of Health Pt denies depression- does have anxiety and is currently not on RX for this but thinks he may need it- suggests he discuss with PCP  Continue wound care         Our next appointment is by telephone on 03/28/22    Please call the care guide team at [phone] if you need to cancel or reschedule your appointment.   If you are experiencing a Mental Health or [place] or need someone to talk to, please call the Suicide and Crisis Lifeline: 988 call 911   The patient verbalized understanding of instructions, educational materials, and care plan provided today and DECLINED offer to receive copy of patient instructions, educational materials, and care plan.   Telephone follow up appointment with care management team member scheduled for:03/28/22  Eduard Clos, MSW, [place] Worker [employer] [phone]

## 2022-03-20 ENCOUNTER — Ambulatory Visit: Payer: Medicare Other | Admitting: Family Medicine

## 2022-03-21 DIAGNOSIS — Z419 Encounter for procedure for purposes other than remedying health state, unspecified: Secondary | ICD-10-CM | POA: Diagnosis not present

## 2022-04-01 ENCOUNTER — Ambulatory Visit: Payer: Self-pay | Admitting: *Deleted

## 2022-04-01 NOTE — Patient Instructions (Signed)
Visit Information  Thank you for taking time to visit with me today. Please don't hesitate to contact me if I can be of assistance to you.   Following are the goals we discussed today:   Goals Addressed             This Visit's Progress    Get Medicaid and help at home       Activities and task to complete in order to accomplish goals.    Pt contacted and was told he now has "[employer] Medicaid"-[name] left message for DSS worker to confimr Pt interested in "getting [employer]"- may be eligible for the [facility] Advantage plan for Dual (with Medicaid)- [name] will research this         Our next appointment is by telephone on 04/03/22  Please call the care guide team at [phone] if you need to cancel or reschedule your appointment.   If you are experiencing a Mental Health or [place] or need someone to talk to, please call the Suicide and Crisis Lifeline: 988 call 911   The patient verbalized understanding of instructions, educational materials, and care plan provided today and DECLINED offer to receive copy of patient instructions, educational materials, and care plan.   Telephone follow up appointment with care management team member scheduled for:04/03/22  Eduard Clos, MSW, [place] Worker [employer] [phone]

## 2022-04-01 NOTE — Patient Outreach (Signed)
  Care Coordination   Follow Up Visit Note   04/01/2022 Name: Anthony [name] MRN: [id number] DOB: Sep 26, 1970  Anthony [name] is a 52 y.o. year old male who sees Precious Gilding, DO for primary care. I spoke with  [name] by phone today.  What matters to the patients health and wellness today?  "Got a call and a text saying I have [employer] Medicaid now"    Goals Addressed             This Visit's Progress    Get Medicaid and help at home       Activities and task to complete in order to accomplish goals.    Pt contacted and was told he now has "[facility] Medicaid"-[name] left message for DSS worker to confimr Pt interested in "getting [employer]"- may be eligible for the [facility] Advantage plan for Dual (with Medicaid)- [name] will research this         SDOH assessments and interventions completed:  Yes     Care Coordination Interventions:  Yes, provided  Interventions Today    Flowsheet Row Most Recent Value  General Interventions   General Interventions Discussed/Reviewed Community Resources  Advanced Directive Interventions   Advanced Directives Discussed/Reviewed Provided resource for acquiring and filling out documents       Follow up plan: Follow up call scheduled for 04/03/22    Encounter Outcome:  Pt. Visit Completed

## 2022-04-04 ENCOUNTER — Ambulatory Visit: Payer: Self-pay | Admitting: *Deleted

## 2022-04-04 ENCOUNTER — Encounter (HOSPITAL_BASED_OUTPATIENT_CLINIC_OR_DEPARTMENT_OTHER): Payer: Medicare Other | Attending: General Surgery | Admitting: Internal Medicine

## 2022-04-04 DIAGNOSIS — Z87440 Personal history of urinary (tract) infections: Secondary | ICD-10-CM | POA: Diagnosis not present

## 2022-04-04 DIAGNOSIS — G8220 Paraplegia, unspecified: Secondary | ICD-10-CM | POA: Insufficient documentation

## 2022-04-04 DIAGNOSIS — M009 Pyogenic arthritis, unspecified: Secondary | ICD-10-CM | POA: Insufficient documentation

## 2022-04-04 DIAGNOSIS — Z87891 Personal history of nicotine dependence: Secondary | ICD-10-CM | POA: Insufficient documentation

## 2022-04-04 DIAGNOSIS — M86452 Chronic osteomyelitis with draining sinus, left femur: Secondary | ICD-10-CM | POA: Insufficient documentation

## 2022-04-04 DIAGNOSIS — L89324 Pressure ulcer of left buttock, stage 4: Secondary | ICD-10-CM | POA: Diagnosis not present

## 2022-04-04 DIAGNOSIS — M1612 Unilateral primary osteoarthritis, left hip: Secondary | ICD-10-CM | POA: Insufficient documentation

## 2022-04-04 DIAGNOSIS — L89313 Pressure ulcer of right buttock, stage 3: Secondary | ICD-10-CM | POA: Diagnosis not present

## 2022-04-04 DIAGNOSIS — Q057 Lumbar spina bifida without hydrocephalus: Secondary | ICD-10-CM | POA: Diagnosis not present

## 2022-04-04 DIAGNOSIS — L97512 Non-pressure chronic ulcer of other part of right foot with fat layer exposed: Secondary | ICD-10-CM | POA: Insufficient documentation

## 2022-04-04 DIAGNOSIS — L89224 Pressure ulcer of left hip, stage 4: Secondary | ICD-10-CM | POA: Insufficient documentation

## 2022-04-04 NOTE — Patient Instructions (Signed)
Visit Information  Thank you for taking time to visit with me today. Please don't hesitate to contact me if I can be of assistance to you.   Following are the goals we discussed today:   Goals Addressed             This Visit's Progress    COMPLETED: Get Medicaid and help at home       Activities and task to complete in order to accomplish goals.    Pt confirms to [name] that he has been approved for "[facility] Medicaid" Pt has moved forward with getting connected with [facility] MA plan offering a a"dual plan" now that he has Medicaid also- states he is working with an Research scientist (life sciences) to finalize this  [name] reminded pt he now may be able to get assistance at home through [employer]- [name] will mail application to pt and ask our Managed Medicaid team to follow up with him going forward [name] placing referral to Managed Medicaid [name] and RNCM for further assist. This [name] will sign off.         If you are experiencing a Mental Health or [place] or need someone to talk to, please call the Suicide and Crisis Lifeline: 988 call 911   The patient verbalized understanding of instructions, educational materials, and care plan provided today and DECLINED offer to receive copy of patient instructions, educational materials, and care plan.   No further follow up required: Expect phone calls from the Managed Medicaid team- Social Worker, [facility] and Pharmacy as needed.  Eduard Clos, MSW, [place] Worker [employer] [phone]

## 2022-04-04 NOTE — Patient Outreach (Addendum)
  Care Coordination   Follow Up Visit Note   04/04/2022 Name: Anthony [name] MRN: [id number] DOB: [birth year]  Anthony [name] is a 52 y.o. year old male who sees Precious Gilding, DO for primary care. I spoke with  Anthony [name] by phone today.  What matters to the patients health and wellness today?  Approved for Medicaid- now to seek [facility] care support.    Goals Addressed             This Visit's Progress    COMPLETED: Get Medicaid and help at home       Activities and task to complete in order to accomplish goals.    Pt confirms to [name] that he has been approved for "[facility] Medicaid" Pt has moved forward with getting connected with [facility] [place] plan offering a a"dual plan" now that he has Medicaid also- states he is working with an Research scientist (life sciences) to finalize this  [name] reminded pt he now may be able to get assistance at home through [employer]- [name] will mail application to pt and ask our Managed Medicaid team to follow up with him going forward [name] placing referral to Managed Medicaid [name] and RNCM for further assist. This [name] will sign off.         SDOH assessments and interventions completed:  Yes     Care Coordination Interventions:  Yes, provided  Interventions Today    Flowsheet Row Most Recent Value  Chronic Disease   Chronic disease during today's visit Other  [spina bifida]  General Interventions   General Interventions Discussed/Reviewed [employer], Communication with  Communication with RN, Social Work  [employer] Managed Medicaid team]  Education Interventions   Education Provided Provided Education  [employer] program- will mail to him also]  Provided Dance movement psychotherapist, [employer]       Follow up plan: Referral made to [employer] team    Encounter Outcome:  Pt. Visit Completed

## 2022-04-05 NOTE — Progress Notes (Signed)
VERNAL, PAGANINI [name] ([id number]) [id number] Page 1 of 8 Visit Report for 04/04/2022 Chief Complaint Document Details Patient Name: Date of Service: Anthony [name], Anthony [name]. 04/04/2022 1:30 PM Medical Record Number: [id number] Patient Account Number: [id number] Date of Birth/Sex: Treating RN: Sep 14, 1970 (52 y.o. M) Primary Care Provider: Precious Gilding Other Clinician: Referring Provider: Treating Provider/Extender: Kalman Jewels in Treatment: 4 Information Obtained from: Patient Chief Complaint Patient is at the clinic for treatment of an open pressure ulcer of the left hip, left ischium, right ischium, and [name] wound on his right foot Electronic Signature(s) Signed: 04/04/2022 3:54:15 PM By: Kalman Shan Anthony Entered By: Kalman Shan on 04/04/2022 14:43:13 -------------------------------------------------------------------------------- HPI Details Patient Name: Date of Service: Anthony [name], Anthony [name]. 04/04/2022 1:30 PM Medical Record Number: [id number] Patient Account Number: [id number] Date of Birth/Sex: Treating RN: 22-Jan-1970 (52 y.o. M) Primary Care Provider: Precious Gilding Other Clinician: Referring Provider: Treating Provider/Extender: Kalman Jewels in Treatment: 4 History of Present Illness HPI Description: ADMISSION 03/07/2022 This is [name] 52 year old man who is paraplegic secondary to lumbar spina bifida. He has had multiple issues with septic arthritis of the left hip and undergone multiple operative interventions including partial excision of the pelvis and acetabulum and partial excision of the proximal femur, soft tissue debridement and tissue rearrangement for closure of the defects. He does have [name] suprapubic catheter and has had mitral valve endocarditis in the past. Looking at the electronic medical record, it appears that Dr. Sharol Given has planned for hip disarticulation, but the patient has been resistant to this. He  has 2 sinus tracts, 1 on his left trochanter and 1 on his left ischium. These tunnel for [name] length of 8.5 and 10 cm respectively. The trochanter wound actually tracks all the way to the skin of his perineum and comes through. The 2 tunnels approximate each other but Anthony not directly connect. He has [name] large ulcer on his right ischium that he says has been present for 12 years. He also has an ulcer on his dorsal lateral right foot with some slough accumulation. Remarkably, he has been packing all of these wounds on his own and per the intake nurse, has been getting adequate packing material into all of these complicated sinus tracts and tunnels. 3/15; patient presents for follow-up. He states he has been using Dakin's wet-to-dry dressings to the tunneled areas and sinus tracts on his bottom and has been using collagen to the right foot wound. He has no issues or complaints today. He denies signs of infection. He did develop [name] small open area to the left lateral leg that he reports happened spontaneously. Does not track or tunnel. Electronic Signature(s) Signed: 04/04/2022 3:54:15 PM By: Kalman Shan Anthony Entered By: Kalman Shan on 04/04/2022 14:44:39 -------------------------------------------------------------------------------- Physical Exam Details Patient Name: Date of Service: Anthony [name], Anthony [name]. 04/04/2022 1:30 PM Medical Record Number: [id number] Patient Account Number: [id number] Date of Birth/Sex: Treating RN: 06/07/1970 (52 y.o. M) Primary Care Provider: Precious Gilding Other Clinician: Referring Provider: Treating Provider/Extender: Kalman Jewels in Treatment: 4 Constitutional respirations regular, non-labored and within target range for patient.DIERK, MIKAN [name] ([id number]) [id number] Page 2 of 8 Cardiovascular 2+ dorsalis pedis/posterior tibialis pulses. Psychiatric pleasant and cooperative. Notes He has 2 sinus tracts, 1 on  his left trochanter and 1 on his left ischium. These tunnel . The trochanter wound actually tracks all the way to the skin of his perineum and comes through.  The 2 tunnels directly connect. He has [name] large ulcer on his right ischium. He also has an ulcer on his dorsal lateral right foot with granulation tissue. New wound to the lateral left leg over [name] previous incision site. Does not tunnel or connect to the wound proximal to this. Electronic Signature(s) Signed: 04/04/2022 3:54:15 PM By: Kalman Shan Anthony Entered By: Kalman Shan on 04/04/2022 14:48:05 -------------------------------------------------------------------------------- Physician Orders Details Patient Name: Date of Service: Anthony [name], Anthony [name]. 04/04/2022 1:30 PM Medical Record Number: [id number] Patient Account Number: [id number] Date of Birth/Sex: Treating RN: January 21, 1970 (52 y.o. Collene Gobble Primary Care Provider: Precious Gilding Other Clinician: Referring Provider: Treating Provider/Extender: Kalman Jewels in Treatment: 4 Verbal / Phone Orders: No Diagnosis Coding Follow-up Appointments Return appointment in 1 month. - Dr. Celine Ahr Room 3+++ Extra Time++++ ( at least an extra 20 minutes) Wound Treatment Wound #1 - Foot Wound Laterality: Dorsal, Right, Lateral Cleanser: Normal Saline Every Other Day/30 Days Discharge Instructions: Cleanse the wound with Normal Saline prior to applying [name] clean dressing using gauze sponges, not tissue or cotton balls. Cleanser: Soap and Water Every Other Day/30 Days Discharge Instructions: May shower and wash wound with dial antibacterial soap and water prior to dressing change. Cleanser: Vashe 5.8 (oz) Every Other Day/30 Days Discharge Instructions: Cleanse the wound with Vashe prior to applying [name] clean dressing using gauze sponges, not tissue or cotton balls. Prim Dressing: Sorbalgon AG Dressing 6x6 (in/in) Every Other Day/30 Days ary Discharge Instructions:  Apply to wound bed as instructed Secondary Dressing: ALLEVYN Gentle Border, 5x5 (in/in) Every Other Day/30 Days Discharge Instructions: Apply over primary dressing as directed. Wound #2 - Trochanter Wound Laterality: Left Cleanser: Normal Saline Every Other Day/30 Days Discharge Instructions: Cleanse the wound with Normal Saline prior to applying [name] clean dressing using gauze sponges, not tissue or cotton balls. Cleanser: Soap and Water Every Other Day/30 Days Discharge Instructions: May shower and wash wound with dial antibacterial soap and water prior to dressing change. Cleanser: Vashe 5.8 (oz) Every Other Day/30 Days Discharge Instructions: Cleanse the wound with Vashe prior to applying [name] clean dressing using gauze sponges, not tissue or cotton balls. Prim Dressing: Sorbalgon AG Dressing 6x6 (in/in) Every Other Day/30 Days ary Discharge Instructions: Apply to wound bed as instructed Secondary Dressing: ALLEVYN Gentle Border, 5x5 (in/in) Every Other Day/30 Days Discharge Instructions: Apply over primary dressing as directed. Wound #3 - Gluteal fold Wound Laterality: Left Cleanser: Normal Saline 1 x Per Day/30 Days Discharge Instructions: Cleanse the wound with Normal Saline prior to applying [name] clean dressing using gauze sponges, not tissue or cotton balls. Cleanser: Wound Cleanser 1 x Per Day/30 Days Discharge Instructions: Cleanse the wound with wound cleanser prior to applying [name] clean dressing using gauze sponges, not tissue or cotton balls. Secondary Dressing: ABD Pad, 8x10 1 x Per Day/30 Days Discharge Instructions: Apply over primary dressing as directed. FIORE, FRANGELLA [name] ([id number]) [id number] Page 3 of 8 Secured With: [employer], 4.5x3.1 (in/yd) 1 x Per Day/30 Days Discharge Instructions: Saline Moistened kerlix Secured With: 55M Medipore Soft Cloth Surgical T 2x10 (in/yd) 1 x Per Day/30 Days ape Discharge Instructions: Secure with tape as  directed. Wound #4 - Gluteal fold Wound Laterality: Right Cleanser: Normal Saline Every Other Day/30 Days Discharge Instructions: Cleanse the wound with Normal Saline prior to applying [name] clean dressing using gauze sponges, not tissue or cotton balls. Cleanser: Soap and Water Every Other Day/30 Days Discharge Instructions: May shower  and wash wound with dial antibacterial soap and water prior to dressing change. Cleanser: Vashe 5.8 (oz) Every Other Day/30 Days Discharge Instructions: Cleanse the wound with Vashe prior to applying [name] clean dressing using gauze sponges, not tissue or cotton balls. Prim Dressing: Sorbalgon AG Dressing 6x6 (in/in) Every Other Day/30 Days ary Discharge Instructions: Apply to wound bed as instructed Secondary Dressing: ALLEVYN Gentle Border, 5x5 (in/in) Every Other Day/30 Days Discharge Instructions: Apply over primary dressing as directed. Wound #5 - Upper Leg Wound Laterality: Left, Lateral Cleanser: Normal Saline Every Other Day/30 Days Discharge Instructions: Cleanse the wound with Normal Saline prior to applying [name] clean dressing using gauze sponges, not tissue or cotton balls. Cleanser: Soap and Water Every Other Day/30 Days Discharge Instructions: May shower and wash wound with dial antibacterial soap and water prior to dressing change. Cleanser: Wound Cleanser Every Other Day/30 Days Discharge Instructions: Cleanse the wound with wound cleanser prior to applying [name] clean dressing using gauze sponges, not tissue or cotton balls. Prim Dressing: Sorbalgon AG Dressing 6x6 (in/in) Every Other Day/30 Days ary Discharge Instructions: Apply to wound bed as instructed Secondary Dressing: ALLEVYN Gentle Border, 5x5 (in/in) Every Other Day/30 Days Discharge Instructions: Apply over primary dressing as directed. Electronic Signature(s) Signed: 04/04/2022 3:54:15 PM By: Kalman Shan Anthony Entered By: Kalman Shan on 04/04/2022  14:48:24 -------------------------------------------------------------------------------- Problem List Details Patient Name: Date of Service: Anthony [name], Anthony [name]. 04/04/2022 1:30 PM Medical Record Number: [id number] Patient Account Number: [id number] Date of Birth/Sex: Treating RN: 11-Jan-1971 (52 y.o. M) Primary Care Provider: Precious Gilding Other Clinician: Referring Provider: Treating Provider/Extender: Kalman Jewels in Treatment: 4 Active Problems ICD-10 Encounter Code Description Active Date MDM Diagnosis [phone] Pressure ulcer of left hip, stage 4 03/07/2022 No Yes L89.324 Pressure ulcer of left buttock, stage 4 03/07/2022 No Yes L89.313 Pressure ulcer of right buttock, stage 3 03/07/2022 No Yes AGUSTIN, FARRAND [name] ([id number]) [id number] Page 4 of 8 L97.512 Non-pressure chronic ulcer of other part of right foot with fat layer exposed 03/07/2022 No Yes M86.452 Chronic osteomyelitis with draining sinus, left femur 03/07/2022 No Yes M00.9 Pyogenic arthritis, unspecified 03/07/2022 No Yes Q05.7 Lumbar spina bifida without hydrocephalus 03/07/2022 No Yes Inactive Problems Resolved Problems Electronic Signature(s) Signed: 04/04/2022 3:54:15 PM By: Kalman Shan Anthony Entered By: Kalman Shan on 04/04/2022 14:42:32 -------------------------------------------------------------------------------- Progress Note Details Patient Name: Date of Service: Anthony [name], Anthony [name]. 04/04/2022 1:30 PM Medical Record Number: [id number] Patient Account Number: [id number] Date of Birth/Sex: Treating RN: [birth year] (52 y.o. M) Primary Care Provider: Precious Gilding Other Clinician: Referring Provider: Treating Provider/Extender: Kalman Jewels in Treatment: 4 Subjective Chief Complaint Information obtained from Patient Patient is at the clinic for treatment of an open pressure ulcer of the left hip, left ischium, right ischium, and [name] wound  on his right foot History of Present Illness (HPI) ADMISSION 03/07/2022 This is [name] 52 year old man who is paraplegic secondary to lumbar spina bifida. He has had multiple issues with septic arthritis of the left hip and undergone multiple operative interventions including partial excision of the pelvis and acetabulum and partial excision of the proximal femur, soft tissue debridement and tissue rearrangement for closure of the defects. He does have [name] suprapubic catheter and has had mitral valve endocarditis in the past. Looking at the electronic medical record, it appears that Dr. Sharol Given has planned for hip disarticulation, but the patient has been resistant to this. He has 2 sinus tracts, 1 on his  left trochanter and 1 on his left ischium. These tunnel for [name] length of 8.5 and 10 cm respectively. The trochanter wound actually tracks all the way to the skin of his perineum and comes through. The 2 tunnels approximate each other but Anthony not directly connect. He has [name] large ulcer on his right ischium that he says has been present for 12 years. He also has an ulcer on his dorsal lateral right foot with some slough accumulation. Remarkably, he has been packing all of these wounds on his own and per the intake nurse, has been getting adequate packing material into all of these complicated sinus tracts and tunnels. 3/15; patient presents for follow-up. He states he has been using Dakin's wet-to-dry dressings to the tunneled areas and sinus tracts on his bottom and has been using collagen to the right foot wound. He has no issues or complaints today. He denies signs of infection. He did develop [name] small open area to the left lateral leg that he reports happened spontaneously. Does not track or tunnel. Patient History Information obtained from Patient. Family History Unknown History. Social History Former smoker, Marital Status - Single, Alcohol Use - Never, Drug Use - No History, Caffeine Use -  Never. Medical History Cardiovascular Patient has history of Arrhythmia - Hx: PVCs Musculoskeletal Patient has history of Osteoarthritis - Arthritis of Hip, Osteomyelitis - Left Hip (08/23) Neurologic Patient has history of Paraplegia Hospitalization/Surgery History - IandD debridements Left Hip; [name] without cardioversion. HENCE, KOSTEN [name] ([id number]) [id number] Page 5 of 8 Medical [name] Surgical History Notes nd Cardiovascular Hx: Endocarditis of Mitral Valve Gastrointestinal Ishiorectal Abscess Slow Transit Constipation Genitourinary Chronic indwelling Foley Catheter Frequent UTIs Neurologic Spina Bifida Objective Constitutional respirations regular, non-labored and within target range for patient.. Vitals Time Taken: 1:27 AM, Height: 68 in, Weight: 165 lbs, BMI: 25.1, Temperature: 98.9 F, Pulse: 91 bpm, Respiratory Rate: 20 breaths/min, Blood Pressure: 123/82 mmHg. Cardiovascular 2+ dorsalis pedis/posterior tibialis pulses. Psychiatric pleasant and cooperative. General Notes: He has 2 sinus tracts, 1 on his left trochanter and 1 on his left ischium. These tunnel . The trochanter wound actually tracks all the way to the skin of his perineum and comes through. The 2 tunnels directly connect. He has [name] large ulcer on his right ischium. He also has an ulcer on his dorsal lateral right foot with granulation tissue. New wound to the lateral left leg over [name] previous incision site. Does not tunnel or connect to the wound proximal to this. Integumentary (Hair, Skin) Wound #1 status is Open. Original cause of wound was Other Lesion. The date acquired was: 07/26/2021. The wound has been in treatment 4 weeks. The wound is located on the Right,Lateral,Dorsal Foot. The wound measures 1.1cm length x 1cm width x 0.1cm depth; 0.864cm^2 area and 0.086cm^3 volume. There is [name] medium amount of serosanguineous drainage noted. There is small (1-33%) pink granulation within the  wound bed. There is [name] large (67-100%) amount of necrotic tissue within the wound bed including Eschar and Adherent Slough. The periwound skin appearance exhibited: Scarring. Wound #2 status is Open. Original cause of wound was Pressure Injury. The date acquired was: 08/27/2021. The wound has been in treatment 4 weeks. The wound is located on the Left Trochanter. The wound measures 1.3cm length x 0.4cm width x 5.7cm depth; 0.408cm^2 area and 2.328cm^3 volume. There is Fat Layer (Subcutaneous Tissue) exposed. There is tunneling at 7:00 with [name] maximum distance of 5.5cm. There is [name] medium amount of serosanguineous drainage noted.  There is small (1-33%) pink granulation within the wound bed. There is [name] large (67-100%) amount of necrotic tissue within the wound bed including Adherent Slough. The periwound skin appearance had no abnormalities noted for texture. The periwound skin appearance had no abnormalities noted for moisture. The periwound skin appearance had no abnormalities noted for color. Periwound temperature was noted as No Abnormality. Wound #3 status is Open. Original cause of wound was Gradually Appeared. The date acquired was: 08/27/2021. The wound has been in treatment 4 weeks. The wound is located on the Left Gluteal fold. The wound measures 0.7cm length x 0.6cm width x 3.1cm depth; 0.33cm^2 area and 1.023cm^3 volume. There is Fat Layer (Subcutaneous Tissue) exposed. There is tunneling at 11:00 with [name] maximum distance of 9.1cm. There is [name] medium amount of serosanguineous drainage noted. There is small (1-33%) red granulation within the wound bed. There is [name] large (67-100%) amount of necrotic tissue within the wound bed including Eschar and Adherent Slough. The periwound skin appearance had no abnormalities noted for moisture. The periwound skin appearance had no abnormalities noted for color. The periwound skin appearance exhibited: Scarring. Periwound temperature was noted as No  Abnormality. Wound #4 status is Open. Original cause of wound was Gradually Appeared. The date acquired was: 12/28/2021. The wound has been in treatment 4 weeks. The wound is located on the Right Gluteal fold. The wound measures 8.1cm length x 3.6cm width x 0.1cm depth; 22.902cm^2 area and 2.29cm^3 volume. There is Fat Layer (Subcutaneous Tissue) exposed. There is [name] medium amount of serosanguineous drainage noted. There is medium (34-66%) red granulation within the wound bed. There is [name] medium (34-66%) amount of necrotic tissue within the wound bed including Adherent Slough. The periwound skin appearance had no abnormalities noted for moisture. The periwound skin appearance had no abnormalities noted for color. The periwound skin appearance exhibited: Scarring. Periwound temperature was noted as No Abnormality. Wound #5 status is Open. Original cause of wound was Pressure Injury. The date acquired was: 03/28/2022. The wound is located on the Left,Lateral Upper Leg. The wound measures 0.5cm length x 0.5cm width x 0.1cm depth; 0.196cm^2 area and 0.02cm^3 volume. There is Fat Layer (Subcutaneous Tissue) exposed. There is no tunneling or undermining noted. There is [name] medium amount of serosanguineous drainage noted. There is small (1-33%) red, pink granulation within the wound bed. There is [name] large (67-100%) amount of necrotic tissue within the wound bed including Adherent Slough. The periwound skin appearance had no abnormalities noted for texture. The periwound skin appearance had no abnormalities noted for moisture. The periwound skin appearance had no abnormalities noted for color. Periwound temperature was noted as No Abnormality. Assessment Active Problems ICD-10 Pressure ulcer of left hip, stage 4 Pressure ulcer of left buttock, stage 4 Pressure ulcer of right buttock, stage 3 Non-pressure chronic ulcer of other part of right foot with fat layer exposed Chronic osteomyelitis with draining sinus,  left femur Pyogenic arthritis, unspecified Lumbar spina bifida without hydrocephalus MCLAIN, BRADWAY [name] ([id number]) [id number] Page 6 of 8 Patient's wounds are relatively stable. No signs of infection. He states he is using collagen to the right foot wound and Dakin's wet-to-dry dressings to the remaining wounds. I recommended continuing this. It seems to work well for him. Follow-up in 1 month per his request. Plan Follow-up Appointments: Return appointment in 1 month. - Dr. Celine Ahr Room 3+++ Extra Time++++ ( at least an extra 20 minutes) WOUND #1: - Foot Wound Laterality: Dorsal, Right, Lateral Cleanser: Normal Saline Every  Other Day/30 Days Discharge Instructions: Cleanse the wound with Normal Saline prior to applying [name] clean dressing using gauze sponges, not tissue or cotton balls. Cleanser: Soap and Water Every Other Day/30 Days Discharge Instructions: May shower and wash wound with dial antibacterial soap and water prior to dressing change. Cleanser: Vashe 5.8 (oz) Every Other Day/30 Days Discharge Instructions: Cleanse the wound with Vashe prior to applying [name] clean dressing using gauze sponges, not tissue or cotton balls. Prim Dressing: Sorbalgon AG Dressing 6x6 (in/in) Every Other Day/30 Days ary Discharge Instructions: Apply to wound bed as instructed Secondary Dressing: ALLEVYN Gentle Border, 5x5 (in/in) Every Other Day/30 Days Discharge Instructions: Apply over primary dressing as directed. WOUND #2: - Trochanter Wound Laterality: Left Cleanser: Normal Saline Every Other Day/30 Days Discharge Instructions: Cleanse the wound with Normal Saline prior to applying [name] clean dressing using gauze sponges, not tissue or cotton balls. Cleanser: Soap and Water Every Other Day/30 Days Discharge Instructions: May shower and wash wound with dial antibacterial soap and water prior to dressing change. Cleanser: Vashe 5.8 (oz) Every Other Day/30 Days Discharge  Instructions: Cleanse the wound with Vashe prior to applying [name] clean dressing using gauze sponges, not tissue or cotton balls. Prim Dressing: Sorbalgon AG Dressing 6x6 (in/in) Every Other Day/30 Days ary Discharge Instructions: Apply to wound bed as instructed Secondary Dressing: ALLEVYN Gentle Border, 5x5 (in/in) Every Other Day/30 Days Discharge Instructions: Apply over primary dressing as directed. WOUND #3: - Gluteal fold Wound Laterality: Left Cleanser: Normal Saline 1 x Per Day/30 Days Discharge Instructions: Cleanse the wound with Normal Saline prior to applying [name] clean dressing using gauze sponges, not tissue or cotton balls. Cleanser: Wound Cleanser 1 x Per Day/30 Days Discharge Instructions: Cleanse the wound with wound cleanser prior to applying [name] clean dressing using gauze sponges, not tissue or cotton balls. Secondary Dressing: ABD Pad, 8x10 1 x Per Day/30 Days Discharge Instructions: Apply over primary dressing as directed. Secured With: [employer], 4.5x3.1 (in/yd) 1 x Per Day/30 Days Discharge Instructions: Saline Moistened kerlix Secured With: 10M Medipore Soft Cloth Surgical T 2x10 (in/yd) 1 x Per Day/30 Days ape Discharge Instructions: Secure with tape as directed. WOUND #4: - Gluteal fold Wound Laterality: Right Cleanser: Normal Saline Every Other Day/30 Days Discharge Instructions: Cleanse the wound with Normal Saline prior to applying [name] clean dressing using gauze sponges, not tissue or cotton balls. Cleanser: Soap and Water Every Other Day/30 Days Discharge Instructions: May shower and wash wound with dial antibacterial soap and water prior to dressing change. Cleanser: Vashe 5.8 (oz) Every Other Day/30 Days Discharge Instructions: Cleanse the wound with Vashe prior to applying [name] clean dressing using gauze sponges, not tissue or cotton balls. Prim Dressing: Sorbalgon AG Dressing 6x6 (in/in) Every Other Day/30 Days ary Discharge Instructions: Apply to wound bed  as instructed Secondary Dressing: ALLEVYN Gentle Border, 5x5 (in/in) Every Other Day/30 Days Discharge Instructions: Apply over primary dressing as directed. WOUND #5: - Upper Leg Wound Laterality: Left, Lateral Cleanser: Normal Saline Every Other Day/30 Days Discharge Instructions: Cleanse the wound with Normal Saline prior to applying [name] clean dressing using gauze sponges, not tissue or cotton balls. Cleanser: Soap and Water Every Other Day/30 Days Discharge Instructions: May shower and wash wound with dial antibacterial soap and water prior to dressing change. Cleanser: Wound Cleanser Every Other Day/30 Days Discharge Instructions: Cleanse the wound with wound cleanser prior to applying [name] clean dressing using gauze sponges, not tissue or cotton balls. Prim Dressing: [place]  AG Dressing 6x6 (in/in) Every Other Day/30 Days ary Discharge Instructions: Apply to wound bed as instructed Secondary Dressing: ALLEVYN Gentle Border, 5x5 (in/in) Every Other Day/30 Days Discharge Instructions: Apply over primary dressing as directed. 1. Dakin's wet-to-dry dressings 2. Collagen 3. Follow-up in 1 month Electronic Signature(s) Signed: 04/04/2022 3:54:15 PM By: Kalman Shan Anthony Entered By: Kalman Shan on 04/04/2022 14:49:13 -------------------------------------------------------------------------------- HxROS Details Patient Name: Date of Service: Anthony [name], Anthony [name]. 04/04/2022 1:30 PM Medical Record Number: [id number] Patient Account Number: [id number] NOAHH, BAUSMAN ([id number]) [id number] Page 7 of 8 Date of Birth/Sex: Treating RN: 1970-12-24 (52 y.o. M) Primary Care Provider: Other Clinician: Precious Gilding Referring Provider: Treating Provider/Extender: Kalman Jewels in Treatment: 4 Information Obtained From Patient Cardiovascular Medical History: Positive for: Arrhythmia - Hx: PVCs Past Medical History Notes: Hx: Endocarditis of  Mitral Valve Gastrointestinal Medical History: Past Medical History Notes: Ishiorectal Abscess Slow Transit Constipation Genitourinary Medical History: Past Medical History Notes: Chronic indwelling Foley Catheter Frequent UTIs Musculoskeletal Medical History: Positive for: Osteoarthritis - Arthritis of Hip; Osteomyelitis - Left Hip (08/23) Neurologic Medical History: Positive for: Paraplegia Past Medical History Notes: Spina Bifida Immunizations Pneumococcal Vaccine: Received Pneumococcal Vaccination: No Implantable Devices No devices added Hospitalization / Surgery History Type of Hospitalization/Surgery IandD debridements Left Hip; [name] without cardioversion Family and Social History Unknown History: Yes; Former smoker; Marital Status - Single; Alcohol Use: Never; Drug Use: No History; Caffeine Use: Never; Financial Concerns: No; Food, Clothing or Shelter Needs: No; Support System Lacking: No; Transportation Concerns: No Electronic Signature(s) Signed: 04/04/2022 3:54:15 PM By: Kalman Shan Anthony Entered By: Kalman Shan on 04/04/2022 14:45:33 -------------------------------------------------------------------------------- SuperBill Details Patient Name: Date of Service: Anthony [name], Anthony [name]. 04/04/2022 Medical Record Number: [id number] Patient Account Number: [id number] Date of Birth/Sex: Treating RN: 1970/03/07 (52 y.o. M) Primary Care Provider: Precious Gilding Other Clinician: Referring Provider: Treating Provider/Extender: Kalman Jewels in Treatment: 4 Diagnosis Coding RASHUN, MCKEOUGH [name] ([id number]) [id number] Page 8 of 8 ICD-10 Codes Code Description [phone] Pressure ulcer of left hip, stage 4 L89.324 Pressure ulcer of left buttock, stage 4 L89.313 Pressure ulcer of right buttock, stage 3 L97.512 Non-pressure chronic ulcer of other part of right foot with fat layer exposed M86.452 Chronic osteomyelitis with  draining sinus, left femur M00.9 Pyogenic arthritis, unspecified Q05.7 Lumbar spina bifida without hydrocephalus Facility Procedures : CPT4 Code: [id number] Description: 99213 - WOUND CARE VISIT-LEV 3 EST PT Modifier: Quantity: 1 Physician Procedures : CPT4 Code Description Modifier [id number] - WC PHYS LEVEL 3 - EST PT ICD-10 Diagnosis Description L89.224 Pressure ulcer of left hip, stage 4 L89.324 Pressure ulcer of left buttock, stage 4 L89.313 Pressure ulcer of right buttock, stage 3 L97.512  Non-pressure chronic ulcer of other part of right foot with fat layer exposed Quantity: 1 Electronic Signature(s) Signed: 04/04/2022 3:54:15 PM By: Kalman Shan Anthony Signed: 04/04/2022 4:45:55 PM By: Dellie Catholic RN Entered By: Dellie Catholic on 04/04/2022 15:44:55

## 2022-04-05 NOTE — Progress Notes (Signed)
Anthony [name], Anthony [name] ([id number]) [id number] Page 1 of 12 Visit Report for 04/04/2022 Arrival Information Details Patient Name: Date of Service: Anthony [name], Anthony [name]. 04/04/2022 1:30 PM Medical Record Number: [id number] Patient Account Number: [id number] Date of Birth/Sex: Treating RN: Mar 16, 1970 (52 y.o. M) Primary Care Anthony [name]: Anthony [name] Other Clinician: Referring Anthony [name]: Treating Anthony [name]/Extender: Anthony [name] in Treatment: 4 Visit Information History Since Last Visit All ordered tests and consults were completed: No Patient Arrived: Wheel Chair Added or deleted any medications: No Arrival Time: 13:27 Any new allergies or adverse reactions: No Accompanied By: self Had [name] fall or experienced change in No Transfer Assistance: None activities of daily living that may affect Patient Identification Verified: Yes risk of falls: Secondary Verification Process Completed: Yes Signs or symptoms of abuse/neglect since last visito No Hospitalized since last visit: No Implantable device outside of the clinic excluding No cellular tissue based products placed in the center since last visit: Pain Present Now: No Electronic Signature(s) Signed: 04/04/2022 2:15:07 PM By: Anthony [name] Entered By: Anthony [name] on 04/04/2022 13:27:30 -------------------------------------------------------------------------------- Clinic Level of Care Assessment Details Patient Name: Date of Service: Anthony [name], Anthony [name]. 04/04/2022 1:30 PM Medical Record Number: [id number] Patient Account Number: [id number] Date of Birth/Sex: Treating RN: Anthony [name] Primary Care Benzion Mesta: Anthony [name] Other Clinician: Referring Aleja Yearwood: Treating Ivoree Felmlee/Extender: Anthony [name] in Treatment: 4 Clinic Level of Care Assessment Items TOOL 1 Quantity Score X- 1 0 Use when EandM and Procedure is performed on INITIAL visit ASSESSMENTS  - Nursing Assessment / Reassessment X- 1 20 General Physical Exam (combine w/ comprehensive assessment (listed just below) when performed on new pt. evals) X- 1 25 Comprehensive Assessment (HX, ROS, Risk Assessments, Wounds Hx, etc.) ASSESSMENTS - Wound and Skin Assessment / Reassessment X- 1 10 Dermatologic / Skin Assessment (not related to wound area) ASSESSMENTS - Ostomy and/or Continence Assessment and Care []  - 0 Incontinence Assessment and Management []  - 0 Ostomy Care Assessment and Management (repouching, etc.) PROCESS - Coordination of Care []  - 0 Simple Patient / Family Education for ongoing care X- 1 20 Complex (extensive) Patient / Family Education for ongoing care X- 1 10 Staff obtains Programmer, systems, Records, T Results / Process Orders est X- 1 10 Staff telephones HHA, Nursing Homes / Clarify orders / etc []  - 0 Routine Transfer to another Facility (non-emergent condition) []  - 0 Routine Hospital Admission (non-emergent condition) X- 1 15 New Admissions / Insurance Authorizations / Ordering NPWT Apligraf, etcJYE, MOOS [name] ([id number]) [id number] Page 2 of 12 []  - 0 Emergency Hospital Admission (emergent condition) PROCESS - Special Needs []  - 0 Pediatric / Minor Patient Management []  - 0 Isolation Patient Management []  - 0 Hearing / Language / Visual special needs []  - 0 Assessment of Community assistance (transportation, D/C planning, etc.) []  - 0 Additional assistance / Altered mentation []  - 0 Support Surface(s) Assessment (bed, cushion, seat, etc.) INTERVENTIONS - Miscellaneous []  - 0 External ear exam []  - 0 Patient Transfer (multiple staff / Civil Service fast streamer / Similar devices) []  - 0 Simple Staple / Suture removal (25 or less) []  - 0 Complex Staple / Suture removal (26 or more) []  - 0 Hypo/Hyperglycemic Management (Anthony not check if billed separately) []  - 0 Ankle / Brachial Index (ABI) - Anthony not check if billed  separately Has the patient been seen at the hospital within the last three years: Yes Total Score: 110 Level  Of Care: New/Established - Level 3 Electronic Signature(s) Signed: 04/04/2022 4:45:55 PM By: Anthony Catholic RN Entered By: Anthony [name] on 04/04/2022 15:44:47 -------------------------------------------------------------------------------- Encounter Discharge Information Details Patient Name: Date of Service: Anthony [name], Anthony [name]. 04/04/2022 1:30 PM Medical Record Number: [id number] Patient Account Number: [id number] Date of Birth/Sex: Treating RN: Anthony [name] (52 y.o. Anthony [name] Primary Care Anthony [name]: Anthony [name] Other Clinician: Referring Anthony [name]: Treating Anthony [name]/Extender: Anthony [name] in Treatment: 4 Encounter Discharge Information Items Discharge Condition: Stable Ambulatory Status: Wheelchair Discharge Destination: Home Transportation: Private Auto Accompanied By: self Schedule Follow-up Appointment: Yes Clinical Summary of Care: Patient Declined Electronic Signature(s) Signed: 04/04/2022 4:45:55 PM By: Anthony Catholic RN Entered By: Anthony [name] on 04/04/2022 15:47:16 -------------------------------------------------------------------------------- Lower Extremity Assessment Details Patient Name: Date of Service: Anthony [name], Anthony [name]. 04/04/2022 1:30 PM Medical Record Number: [id number] Patient Account Number: [id number] Date of Birth/Sex: Treating RN: April 24, 1970 (52 y.o. Anthony [name] Primary Care Anthony [name]: Anthony [name] Other Clinician: Referring Kaikoa Magro: Treating Qadir Folks/Extender: Anthony [name] in Treatment: 4 Vascular Assessment Pulses: Dorsalis HUEY, NATHE [name] ([id number]) [id number] Page 3 of 12 Yes] Electronic Signature(s) Signed: 04/04/2022 4:45:55 PM By: Anthony Catholic RN Entered By: Anthony [name] on 04/04/2022  13:Anthony:Anthony -------------------------------------------------------------------------------- Multi Wound Chart Details Patient Name: Date of Service: Anthony [name], Anthony [name]. 04/04/2022 1:30 PM Medical Record Number: [id number] Patient Account Number: [id number] Date of Birth/Sex: Treating RN: [birth year] (52 y.o. M) Primary Care Amaryah Mallen: Anthony [name] Other Clinician: Referring Alsha Meland: Treating Talor Desrosiers/Extender: Anthony [name] in Treatment: 4 Vital Signs Height(in): 70 Pulse(bpm): 44 Weight(lbs): 165 Blood Pressure(mmHg): 123/82 Body Mass Index(BMI): 25.1 Temperature(F): 98.9 Respiratory Rate(breaths/min): 20 [1:Photos:] Right, Lateral, Dorsal Foot Left Trochanter Left Gluteal fold Wound Location: Other Lesion Pressure Injury Gradually Appeared Wounding Event: Lesion Infection - not elsewhere classified Pressure Ulcer Primary Etiology: Arrhythmia, Osteoarthritis, Arrhythmia, Osteoarthritis, Arrhythmia, Osteoarthritis, Comorbid History: Osteomyelitis, Paraplegia Osteomyelitis, Paraplegia Osteomyelitis, Paraplegia 07/26/2021 08/27/2021 08/27/2021 Date Acquired: 4 4 4  Weeks of Treatment: Open Open Open Wound Status: No No No Wound Recurrence: 1.1x1x0.1 1.3x0.4x5.7 0.7x0.6x3.1 Measurements L x W x D (cm) 0.864 0.408 0.33 [name] (cm) : rea 0.086 2.328 1.023 Volume (cm) : 89.00% 65.40% 72.00% % Reduction in [name] rea: 89.00% 76.80% 91.30% % Reduction in Volume: 7 11 Position 1 (o'clock): 5.5 9.1 Maximum Distance 1 (cm): N/[name] Yes Yes Tunneling: Full Thickness Without Exposed Full Thickness Without Exposed Category/Stage IV Classification: Support Structures Support Structures Medium Medium Medium Exudate [name] mount: Serosanguineous Serosanguineous Serosanguineous Exudate Type: red, Peach red, [place] red, Saunders Exudate Color: Small (1-33%) Small (1-33%) Small (1-33%) Granulation [name] mount: Pink Pink Red Granulation Quality: Large (67-100%) Large (67-100%)  Large (67-100%) Necrotic [name] mount: Eschar, Adherent Riley, Adherent Slough Necrotic Tissue: Small (1-33%) None None Epithelialization: Scarring: Yes No Abnormalities Noted Scarring: Yes Periwound Skin Texture: No Abnormalities Noted No Abnormalities Noted Periwound Skin Moisture: No Abnormalities Noted No Abnormalities Noted Periwound Skin Color: N/[name] No Abnormality No Abnormality Temperature: Wound Number: 4 5 N/[name] Photos: N/[name] ASAHD, Anthony [name] ([id number]) [id number] Page 4 of 12 Right Gluteal fold Left, Lateral Upper Leg N/[name] Wound Location: Gradually Appeared Pressure Injury N/[name] Wounding Event: Pressure Ulcer Pressure Ulcer N/[name] Primary Etiology: Arrhythmia, Osteoarthritis, Arrhythmia, Osteoarthritis, N/[name] Comorbid History: Osteomyelitis, Paraplegia Osteomyelitis, Paraplegia 12/28/2021 03/28/2022 N/[name] Date Acquired: 4 0 N/[name] Weeks of Treatment: Open Open N/[name] Wound Status: No No N/[name] Wound Recurrence: 8.1x3.6x0.1 0.5x0.5x0.1 N/[name] Measurements L x W x D (cm) 22.902 0.196  N/[name] [name] (cm) : rea 2.29 0.02 N/[name] Volume (cm) : 35.20% N/[name] N/[name] % Reduction in [name] rea: 67.60% N/[name] N/[name] % Reduction in Volume: N/[name] No N/[name] Tunneling: Category/Stage III Category/Stage II N/[name] Classification: Medium Medium N/[name] Exudate [name] mount: Serosanguineous Serosanguineous N/[name] Exudate Type: red, [place] red, [place] N/[name] Exudate Color: Medium (34-66%) Small (1-33%) N/[name] Granulation [name] mount: Red Red, Pink N/[name] Granulation Quality: Medium (34-66%) Large (67-100%) N/[name] Necrotic [name] mount: Adherent Slough Adherent Slough N/[name] Necrotic Tissue: Fat Layer (Subcutaneous Tissue): Yes Fat Layer (Subcutaneous Tissue): Yes N/[name] Exposed Structures: Fascia: No Fascia: No Tendon: No Tendon: No Muscle: No Muscle: No Joint: No Joint: No Bone: No Bone: No None N/[name] N/[name] Epithelialization: Scarring: Yes No Abnormalities Noted N/[name] Periwound Skin Texture: No Abnormalities  Noted No Abnormalities Noted N/[name] Periwound Skin Moisture: No Abnormalities Noted No Abnormalities Noted N/[name] Periwound Skin Color: No Abnormality No Abnormality N/[name] Temperature: Treatment Notes Electronic Signature(s) Signed: 04/04/2022 3:54:15 PM By: Anthony Shan Anthony Entered By: Anthony Shan on 04/04/2022 14:42:39 -------------------------------------------------------------------------------- Multi-Disciplinary Care Plan Details Patient Name: Date of Service: Anthony [name], Anthony [name]. 04/04/2022 1:30 PM Medical Record Number: [id number] Patient Account Number: [id number] Date of Birth/Sex: Treating RN: 12/26/1970 (52 y.o. Anthony [name] Primary Care Kryssa Risenhoover: Anthony [name] Other Clinician: Referring Adley Castello: Treating Shakiah Wester/Extender: Anthony [name] in Treatment: 4 Active Inactive Wound/Skin Impairment Nursing Diagnoses: Impaired tissue integrity Goals: Patient/caregiver will verbalize understanding of skin care regimen Date Initiated: 03/07/2022 Target Resolution Date: 09/20/2022 Goal Status: Active Interventions: Assess ulceration(s) every visit Treatment Activities: Skin care regimen initiated : 03/07/2022 Notes: Electronic Signature(s) Signed: 04/04/2022 4:45:55 PM By: Anthony Catholic RN Owens Shark, Anthony [name] ([id number]) PM By: Anthony Catholic RN [phone].pdf Page 5 of 12 Signed: 04/04/2022 4:45:55 Entered By: Anthony [name] on 04/04/2022 15:42:24 -------------------------------------------------------------------------------- Pain Assessment Details Patient Name: Date of Service: Anthony [name], Anthony [name]. 04/04/2022 1:30 PM Medical Record Number: [id number] Patient Account Number: [id number] Date of Birth/Sex: Treating RN: [birth year] (52 y.o. M) Primary Care Nyrah Demos: Anthony [name] Other Clinician: Referring Amenda Duclos: Treating Corayma Cashatt/Extender: Anthony [name] in Treatment: 4 Active Problems Location of  Pain Severity and Description of Pain Patient Has Paino No Site Locations Pain Management and Medication Current Pain Management: Electronic Signature(s) Signed: 04/04/2022 2:15:07 PM By: Anthony [name] Entered By: Anthony [name] on 04/04/2022 13:27:Anthony -------------------------------------------------------------------------------- Patient/Caregiver Education Details Patient Name: Date of Service: Anthony [name], Anthony [name]. 3/15/2024andnbsp1:30 PM Medical Record Number: [id number] Patient Account Number: [id number] Date of Birth/Gender: Treating RN: 1970/05/04 (52 y.o. Anthony [name] Primary Care Physician: Anthony [name] Other Clinician: Referring Physician: Treating Physician/Extender: Anthony [name] in Treatment: 4 Education Assessment Education Provided To: Patient Education Topics Provided Wound/Skin Impairment: Methods: Explain/Verbal Responses: Return demonstration correctly Electronic Signature(s) Signed: 04/04/2022 4:45:55 PM By: Anthony Catholic RN Entered By: Anthony [name] on 04/04/2022 15:42:38 Anthony [name], Anthony [name] ([id number]) [id number] Page 6 of 12 -------------------------------------------------------------------------------- Wound Assessment Details Patient Name: Date of Service: Anthony [name], Anthony [name]. 04/04/2022 1:30 PM Medical Record Number: [id number] Patient Account Number: [id number] Date of Birth/Sex: Treating RN: 1970/03/13 (52 y.o. Anthony [name] Primary Care Braniyah Besse: Anthony [name] Other Clinician: Referring Dael Howland: Treating Kentaro Alewine/Extender: Anthony [name] in Treatment: 4 Wound Status Wound Number: 1 Primary Etiology: Lesion Wound Location: Right, Lateral, Dorsal Foot Wound Status: Open Wounding Event: Other Lesion Comorbid History: Arrhythmia, Osteoarthritis, Osteomyelitis, Paraplegia Date Acquired: 07/26/2021 Weeks Of Treatment: 4 Clustered Wound: No Photos Wound Measurements Length:  (cm) 1.1  Width: (cm) 1 Depth: (cm) 0.1 Area: (cm) 0.864 Volume: (cm) 0.086 % Reduction in Area: 89% % Reduction in Volume: 89% Epithelialization: Small (1-33%) Wound Description Classification: Full Thickness Without Exposed Support Structures Exudate Amount: Medium Exudate Type: Serosanguineous Exudate Color: red, [place] Foul Odor After Cleansing: No Slough/Fibrino Yes Wound Bed Granulation Amount: Small (1-33%) Granulation Quality: Pink Necrotic Amount: Large (67-100%) Necrotic Quality: Eschar, Adherent Slough Periwound Skin Texture Texture Color No Abnormalities Noted: No No Abnormalities Noted: No Scarring: Yes Moisture No Abnormalities Noted: No Treatment Notes Wound #1 (Foot) Wound Laterality: Dorsal, Right, Lateral Cleanser Normal Saline Discharge Instruction: Cleanse the wound with Normal Saline prior to applying [name] clean dressing using gauze sponges, not tissue or cotton balls. Soap and Water Discharge Instruction: May shower and wash wound with dial antibacterial soap and water prior to dressing change. Vashe 5.8 (oz) Discharge Instruction: Cleanse the wound with Vashe prior to applying [name] clean dressing using gauze sponges, not tissue or cotton balls. Anthony [name], Anthony [name] ([id number]) [id number] Page 7 of 12 Peri-Wound Care Topical Primary Dressing Sorbalgon AG Dressing 6x6 (in/in) Discharge Instruction: Apply to wound bed as instructed Secondary Dressing ALLEVYN Gentle Border, 5x5 (in/in) Discharge Instruction: Apply over primary dressing as directed. Secured With Compression Wrap Compression Stockings Environmental education officer) Signed: 04/04/2022 4:45:55 PM By: Anthony Catholic RN Entered By: Anthony [name] on 04/04/2022 13:42:30 -------------------------------------------------------------------------------- Wound Assessment Details Patient Name: Date of Service: Anthony [name], Anthony [name]. 04/04/2022 1:30 PM Medical Record Number:  [id number] Patient Account Number: [id number] Date of Birth/Sex: Treating RN: 11-21-1970 (52 y.o. Anthony [name] Primary Care Rabab Currington: Anthony [name] Other Clinician: Referring Jaidin Richison: Treating Oneka Parada/Extender: Anthony [name] in Treatment: 4 Wound Status Wound Number: 2 Primary Etiology: Infection - not elsewhere classified Wound Location: Left Trochanter Wound Status: Open Wounding Event: Pressure Injury Comorbid History: Arrhythmia, Osteoarthritis, Osteomyelitis, Paraplegia Date Acquired: 08/27/2021 Weeks Of Treatment: 4 Clustered Wound: No Photos Wound Measurements Length: (cm) 1.3 Width: (cm) 0.4 Depth: (cm) 5.7 Area: (cm) 0.408 Volume: (cm) 2.328 % Reduction in Area: 65.4% % Reduction in Volume: 76.8% Epithelialization: None Tunneling: Yes Position (o'clock): 7 Maximum Distance: (cm) 5.5 Wound Description Classification: Full Thickness Without Exposed Support Exudate Amount: Medium Exudate Type: Serosanguineous Exudate Color: red, [place] Structures Foul Odor After Cleansing: No Slough/Fibrino Yes Wound Bed Granulation Amount: Small (1-33%) Exposed Anthony [name], Anthony [name] ([id number]) [id number] Page 8 of 12 Granulation Quality: Pink Fascia Exposed: No Necrotic Amount: Large (67-100%) Fat Layer (Subcutaneous Tissue) Exposed: Yes Necrotic Quality: Adherent Slough Tendon Exposed: No Muscle Exposed: No Joint Exposed: No Bone Exposed: No Periwound Skin Texture Texture Color No Abnormalities Noted: Yes No Abnormalities Noted: Yes Moisture Temperature / Pain No Abnormalities Noted: Yes Temperature: No Abnormality Treatment Notes Wound #2 (Trochanter) Wound Laterality: Left Cleanser Normal Saline Discharge Instruction: Cleanse the wound with Normal Saline prior to applying [name] clean dressing using gauze sponges, not tissue or cotton balls. Soap and Water Discharge Instruction: May shower and wash wound  with dial antibacterial soap and water prior to dressing change. Vashe 5.8 (oz) Discharge Instruction: Cleanse the wound with Vashe prior to applying [name] clean dressing using gauze sponges, not tissue or cotton balls. Peri-Wound Care Topical Primary Dressing Sorbalgon AG Dressing 6x6 (in/in) Discharge Instruction: Apply to wound bed as instructed Secondary Dressing ALLEVYN Gentle Border, 5x5 (in/in) Discharge Instruction: Apply over primary dressing as directed. Secured With Compression Wrap Compression Stockings Environmental education officer) Signed: 04/04/2022 4:45:55 PM By: Anthony Catholic RN Entered By: Anthony [name]  on 04/04/2022 13:56:21 -------------------------------------------------------------------------------- Wound Assessment Details Patient Name: Date of Service: Anthony [name], Anthony [name]. 04/04/2022 1:30 PM Medical Record Number: [id number] Patient Account Number: [id number] Date of Birth/Sex: Treating RN: 1970-12-02 (52 y.o. Anthony [name] Primary Care Kaina Orengo: Anthony [name] Other Clinician: Referring Erma Joubert: Treating Larysa Pall/Extender: Anthony [name] in Treatment: 4 Wound Status Wound Number: 3 Primary Etiology: Pressure Ulcer Wound Location: Left Gluteal fold Wound Status: Open Wounding Event: Gradually Appeared Comorbid History: Arrhythmia, Osteoarthritis, Osteomyelitis, Paraplegia Date Acquired: 08/27/2021 Weeks Of Treatment: 4 Clustered Wound: No Photos Anthony [name], Anthony [name] ([id number]) [id number] Page 9 of 12 Wound Measurements Length: (cm) 0.7 Width: (cm) 0.6 Depth: (cm) 3.1 Area: (cm) 0.33 Volume: (cm) 1.023 % Reduction in Area: 72% % Reduction in Volume: 91.3% Epithelialization: None Tunneling: Yes Position (o'clock): 11 Maximum Distance: (cm) 9.1 Wound Description Classification: Category/Stage IV Exudate Amount: Medium Exudate Type: Serosanguineous Exudate Color: red, [place] Foul Odor After  Cleansing: No Slough/Fibrino Yes Wound Bed Granulation Amount: Small (1-33%) Exposed Structure Granulation Quality: Red Fat Layer (Subcutaneous Tissue) Exposed: Yes Necrotic Amount: Large (67-100%) Necrotic Quality: Eschar, Adherent Slough Periwound Skin Texture Texture Color No Abnormalities Noted: No No Abnormalities Noted: Yes Scarring: Yes Temperature / Pain Temperature: No Abnormality Moisture No Abnormalities Noted: Yes Electronic Signature(s) Signed: 04/04/2022 4:45:55 PM By: Anthony Catholic RN Entered By: Anthony [name] on 04/04/2022 13:56:45 -------------------------------------------------------------------------------- Wound Assessment Details Patient Name: Date of Service: Anthony [name], Anthony [name]. 04/04/2022 1:30 PM Medical Record Number: [id number] Patient Account Number: [id number] Date of Birth/Sex: Treating RN: [birth year] (52 y.o. Anthony [name] Primary Care Izayah Miner: Anthony [name] Other Clinician: Referring Genea Rheaume: Treating Kimm Sider/Extender: Anthony [name] in Treatment: 4 Wound Status Wound Number: 4 Primary Etiology: Pressure Ulcer Wound Location: Right Gluteal fold Wound Status: Open Wounding Event: Gradually Appeared Comorbid History: Arrhythmia, Osteoarthritis, Osteomyelitis, Paraplegia Date Acquired: 12/28/2021 Weeks Of Treatment: 4 Clustered Wound: No Photos Anthony [name], Anthony [name] ([id number]) [id number] Page 10 of 12 Wound Measurements Length: (cm) 8.1 Width: (cm) 3.6 Depth: (cm) 0.1 Area: (cm) 22.902 Volume: (cm) 2.29 % Reduction in Area: 35.2% % Reduction in Volume: 67.6% Epithelialization: None Wound Description Classification: Category/Stage III Exudate Amount: Medium Exudate Type: Serosanguineous Exudate Color: red, [place] Foul Odor After Cleansing: No Slough/Fibrino Yes Wound Bed Granulation Amount: Medium (34-66%) Exposed Structure Granulation Quality: Red Fascia Exposed: No Necrotic  Amount: Medium (34-66%) Fat Layer (Subcutaneous Tissue) Exposed: Yes Necrotic Quality: Adherent Slough Tendon Exposed: No Muscle Exposed: No Joint Exposed: No Bone Exposed: No Periwound Skin Texture Texture Color No Abnormalities Noted: No No Abnormalities Noted: Yes Scarring: Yes Temperature / Pain Temperature: No Abnormality Moisture No Abnormalities Noted: Yes Treatment Notes Wound #4 (Gluteal fold) Wound Laterality: Right Cleanser Normal Saline Discharge Instruction: Cleanse the wound with Normal Saline prior to applying [name] clean dressing using gauze sponges, not tissue or cotton balls. Soap and Water Discharge Instruction: May shower and wash wound with dial antibacterial soap and water prior to dressing change. Vashe 5.8 (oz) Discharge Instruction: Cleanse the wound with Vashe prior to applying [name] clean dressing using gauze sponges, not tissue or cotton balls. Peri-Wound Care Topical Primary Dressing Sorbalgon AG Dressing 6x6 (in/in) Discharge Instruction: Apply to wound bed as instructed Secondary Dressing ALLEVYN Gentle Border, 5x5 (in/in) Discharge Instruction: Apply over primary dressing as directed. Secured With Compression Wrap Compression Stockings Add-Ons Anthony [name], Anthony [name] ([id number]) [id number] Page 11 of 12 Electronic Signature(s) Signed: 04/04/2022 4:45:55 PM By: Anthony Catholic RN Entered By: Anthony [name] on 04/04/2022  14:00:15 -------------------------------------------------------------------------------- Wound Assessment Details Patient Name: Date of Service: Anthony [name], Anthony [name]. 04/04/2022 1:30 PM Medical Record Number: [id number] Patient Account Number: [id number] Date of Birth/Sex: Treating RN: 1971/01/03 (52 y.o. Anthony [name] Primary Care Leeloo Silverthorne: Anthony [name] Other Clinician: Referring Hanna Ra: Treating Mirjana Tarleton/Extender: Anthony [name] in Treatment: 4 Wound Status Wound Number:  5 Primary Etiology: Pressure Ulcer Wound Location: Left, Lateral Upper Leg Wound Status: Open Wounding Event: Pressure Injury Comorbid History: Arrhythmia, Osteoarthritis, Osteomyelitis, Paraplegia Date Acquired: 03/28/2022 Weeks Of Treatment: 0 Clustered Wound: No Photos Wound Measurements Length: (cm) 0.5 Width: (cm) 0.5 Depth: (cm) 0.1 Area: (cm) 0.196 Volume: (cm) 0.02 % Reduction in Area: % Reduction in Volume: Tunneling: No Undermining: No Wound Description Classification: Category/Stage II Exudate Amount: Medium Exudate Type: Serosanguineous Exudate Color: red, [place] Foul Odor After Cleansing: No Slough/Fibrino Yes Wound Bed Granulation Amount: Small (1-33%) Exposed Structure Granulation Quality: Red, Pink Fascia Exposed: No Necrotic Amount: Large (67-100%) Fat Layer (Subcutaneous Tissue) Exposed: Yes Necrotic Quality: Adherent Slough Tendon Exposed: No Muscle Exposed: No Joint Exposed: No Bone Exposed: No Periwound Skin Texture Texture Color No Abnormalities Noted: Yes No Abnormalities Noted: Yes Moisture Temperature / Pain No Abnormalities Noted: Yes Temperature: No Abnormality Treatment Notes Wound #5 (Upper Leg) Wound Laterality: Left, Lateral Cleanser Normal Saline Discharge Instruction: Cleanse the wound with Normal Saline prior to applying [name] clean dressing using gauze sponges, not tissue or cotton balls. Anthony [name], GOH [name] ([id number]) [id number] Page 12 of 12 Soap and Water Discharge Instruction: May shower and wash wound with dial antibacterial soap and water prior to dressing change. Wound Cleanser Discharge Instruction: Cleanse the wound with wound cleanser prior to applying [name] clean dressing using gauze sponges, not tissue or cotton balls. Peri-Wound Care Topical Primary Dressing Sorbalgon AG Dressing 6x6 (in/in) Discharge Instruction: Apply to wound bed as instructed Secondary Dressing ALLEVYN Gentle Border, 5x5  (in/in) Discharge Instruction: Apply over primary dressing as directed. Secured With Compression Wrap Compression Stockings Environmental education officer) Signed: 04/04/2022 4:45:55 PM By: Anthony Catholic RN Entered By: Anthony [name] on 04/04/2022 13:58:46 -------------------------------------------------------------------------------- Vitals Details Patient Name: Date of Service: Anthony [name], Anthony [name]. 04/04/2022 1:30 PM Medical Record Number: [id number] Patient Account Number: [id number] Date of Birth/Sex: Treating RN: [birth year] (52 y.o. M) Primary Care Rally Ouch: Anthony [name] Other Clinician: Referring Naveh Rickles: Treating Calinda Stockinger/Extender: Anthony [name] in Treatment: 4 Vital Signs Time Taken: 01:27 Temperature (F): 98.9 Height (in): 68 Pulse (bpm): 91 Weight (lbs): 165 Respiratory Rate (breaths/min): 20 Body Mass Index (BMI): 25.1 Blood Pressure (mmHg): 123/82 Reference Range: 80 - 120 mg / dl Electronic Signature(s) Signed: 04/04/2022 2:15:07 PM By: Anthony [name] Entered By: Anthony [name] on 04/04/2022 13:27:50

## 2022-04-21 DIAGNOSIS — Z419 Encounter for procedure for purposes other than remedying health state, unspecified: Secondary | ICD-10-CM | POA: Diagnosis not present

## 2022-05-01 ENCOUNTER — Other Ambulatory Visit: Payer: Self-pay | Admitting: Cardiology

## 2022-05-02 ENCOUNTER — Ambulatory Visit (HOSPITAL_BASED_OUTPATIENT_CLINIC_OR_DEPARTMENT_OTHER): Payer: Medicare Other | Admitting: General Surgery

## 2022-05-21 DIAGNOSIS — Z419 Encounter for procedure for purposes other than remedying health state, unspecified: Secondary | ICD-10-CM | POA: Diagnosis not present

## 2022-06-21 DIAGNOSIS — Z419 Encounter for procedure for purposes other than remedying health state, unspecified: Secondary | ICD-10-CM | POA: Diagnosis not present

## 2022-07-21 DIAGNOSIS — Z419 Encounter for procedure for purposes other than remedying health state, unspecified: Secondary | ICD-10-CM | POA: Diagnosis not present

## 2022-07-25 ENCOUNTER — Inpatient Hospital Stay (HOSPITAL_COMMUNITY)
Admission: EM | Admit: 2022-07-25 | Discharge: 2022-09-01 | Disposition: A | Discharge status: Skilled Nursing Facility | Payer: Medicare HMO | Source: Home / Self Care | Attending: Family Medicine | Admitting: Family Medicine

## 2022-07-25 ENCOUNTER — Emergency Department (HOSPITAL_COMMUNITY): Payer: Medicare HMO

## 2022-07-25 ENCOUNTER — Other Ambulatory Visit: Payer: Self-pay

## 2022-07-25 DIAGNOSIS — Z8679 Personal history of other diseases of the circulatory system: Secondary | ICD-10-CM

## 2022-07-25 DIAGNOSIS — E785 Hyperlipidemia, unspecified: Secondary | ICD-10-CM | POA: Diagnosis present

## 2022-07-25 DIAGNOSIS — F419 Anxiety disorder, unspecified: Secondary | ICD-10-CM | POA: Diagnosis present

## 2022-07-25 DIAGNOSIS — M86652 Other chronic osteomyelitis, left thigh: Secondary | ICD-10-CM | POA: Diagnosis not present

## 2022-07-25 DIAGNOSIS — D649 Anemia, unspecified: Secondary | ICD-10-CM

## 2022-07-25 DIAGNOSIS — I330 Acute and subacute infective endocarditis: Secondary | ICD-10-CM

## 2022-07-25 DIAGNOSIS — I493 Ventricular premature depolarization: Secondary | ICD-10-CM | POA: Diagnosis not present

## 2022-07-25 DIAGNOSIS — L0231 Cutaneous abscess of buttock: Secondary | ICD-10-CM | POA: Diagnosis present

## 2022-07-25 DIAGNOSIS — M62562 Muscle wasting and atrophy, not elsewhere classified, left lower leg: Secondary | ICD-10-CM | POA: Diagnosis present

## 2022-07-25 DIAGNOSIS — L8944 Pressure ulcer of contiguous site of back, buttock and hip, stage 4: Secondary | ICD-10-CM | POA: Diagnosis present

## 2022-07-25 DIAGNOSIS — L02415 Cutaneous abscess of right lower limb: Secondary | ICD-10-CM | POA: Diagnosis not present

## 2022-07-25 DIAGNOSIS — R0600 Dyspnea, unspecified: Secondary | ICD-10-CM | POA: Diagnosis not present

## 2022-07-25 DIAGNOSIS — M86659 Other chronic osteomyelitis, unspecified thigh: Secondary | ICD-10-CM | POA: Diagnosis not present

## 2022-07-25 DIAGNOSIS — Z9359 Other cystostomy status: Secondary | ICD-10-CM

## 2022-07-25 DIAGNOSIS — A419 Sepsis, unspecified organism: Secondary | ICD-10-CM

## 2022-07-25 DIAGNOSIS — L89314 Pressure ulcer of right buttock, stage 4: Secondary | ICD-10-CM | POA: Diagnosis not present

## 2022-07-25 DIAGNOSIS — R8271 Bacteriuria: Secondary | ICD-10-CM | POA: Diagnosis present

## 2022-07-25 DIAGNOSIS — M19011 Primary osteoarthritis, right shoulder: Secondary | ICD-10-CM | POA: Diagnosis present

## 2022-07-25 DIAGNOSIS — Q057 Lumbar spina bifida without hydrocephalus: Secondary | ICD-10-CM

## 2022-07-25 DIAGNOSIS — K922 Gastrointestinal hemorrhage, unspecified: Secondary | ICD-10-CM | POA: Diagnosis not present

## 2022-07-25 DIAGNOSIS — Z419 Encounter for procedure for purposes other than remedying health state, unspecified: Secondary | ICD-10-CM | POA: Diagnosis not present

## 2022-07-25 DIAGNOSIS — Z1152 Encounter for screening for COVID-19: Secondary | ICD-10-CM

## 2022-07-25 DIAGNOSIS — G8221 Paraplegia, complete: Secondary | ICD-10-CM | POA: Diagnosis present

## 2022-07-25 DIAGNOSIS — M7989 Other specified soft tissue disorders: Secondary | ICD-10-CM | POA: Diagnosis not present

## 2022-07-25 DIAGNOSIS — M62561 Muscle wasting and atrophy, not elsewhere classified, right lower leg: Secondary | ICD-10-CM | POA: Diagnosis present

## 2022-07-25 DIAGNOSIS — M0000 Staphylococcal arthritis, unspecified joint: Secondary | ICD-10-CM | POA: Diagnosis not present

## 2022-07-25 DIAGNOSIS — B999 Unspecified infectious disease: Secondary | ICD-10-CM | POA: Diagnosis not present

## 2022-07-25 DIAGNOSIS — Z933 Colostomy status: Secondary | ICD-10-CM | POA: Diagnosis not present

## 2022-07-25 DIAGNOSIS — E876 Hypokalemia: Secondary | ICD-10-CM | POA: Diagnosis present

## 2022-07-25 DIAGNOSIS — M009 Pyogenic arthritis, unspecified: Secondary | ICD-10-CM | POA: Diagnosis present

## 2022-07-25 DIAGNOSIS — R1110 Vomiting, unspecified: Secondary | ICD-10-CM | POA: Diagnosis not present

## 2022-07-25 DIAGNOSIS — Z87891 Personal history of nicotine dependence: Secondary | ICD-10-CM | POA: Diagnosis not present

## 2022-07-25 DIAGNOSIS — I82A11 Acute embolism and thrombosis of right axillary vein: Secondary | ICD-10-CM | POA: Diagnosis present

## 2022-07-25 DIAGNOSIS — M8618 Other acute osteomyelitis, other site: Secondary | ICD-10-CM | POA: Diagnosis not present

## 2022-07-25 DIAGNOSIS — N179 Acute kidney failure, unspecified: Secondary | ICD-10-CM | POA: Diagnosis present

## 2022-07-25 DIAGNOSIS — R6520 Severe sepsis without septic shock: Secondary | ICD-10-CM

## 2022-07-25 DIAGNOSIS — Z86718 Personal history of other venous thrombosis and embolism: Secondary | ICD-10-CM

## 2022-07-25 DIAGNOSIS — M25511 Pain in right shoulder: Secondary | ICD-10-CM | POA: Diagnosis not present

## 2022-07-25 DIAGNOSIS — S71002A Unspecified open wound, left hip, initial encounter: Secondary | ICD-10-CM | POA: Diagnosis not present

## 2022-07-25 DIAGNOSIS — L089 Local infection of the skin and subcutaneous tissue, unspecified: Secondary | ICD-10-CM

## 2022-07-25 DIAGNOSIS — D638 Anemia in other chronic diseases classified elsewhere: Secondary | ICD-10-CM | POA: Diagnosis present

## 2022-07-25 DIAGNOSIS — G8220 Paraplegia, unspecified: Secondary | ICD-10-CM | POA: Diagnosis not present

## 2022-07-25 DIAGNOSIS — M8669 Other chronic osteomyelitis, multiple sites: Secondary | ICD-10-CM | POA: Diagnosis not present

## 2022-07-25 DIAGNOSIS — L8990 Pressure ulcer of unspecified site, unspecified stage: Secondary | ICD-10-CM | POA: Diagnosis not present

## 2022-07-25 DIAGNOSIS — R630 Anorexia: Secondary | ICD-10-CM | POA: Diagnosis present

## 2022-07-25 DIAGNOSIS — L89324 Pressure ulcer of left buttock, stage 4: Secondary | ICD-10-CM | POA: Diagnosis not present

## 2022-07-25 DIAGNOSIS — M86159 Other acute osteomyelitis, unspecified femur: Secondary | ICD-10-CM | POA: Diagnosis not present

## 2022-07-25 DIAGNOSIS — M8668 Other chronic osteomyelitis, other site: Secondary | ICD-10-CM | POA: Diagnosis present

## 2022-07-25 DIAGNOSIS — A408 Other streptococcal sepsis: Principal | ICD-10-CM | POA: Diagnosis present

## 2022-07-25 DIAGNOSIS — N281 Cyst of kidney, acquired: Secondary | ICD-10-CM | POA: Diagnosis not present

## 2022-07-25 DIAGNOSIS — M779 Enthesopathy, unspecified: Secondary | ICD-10-CM | POA: Diagnosis present

## 2022-07-25 DIAGNOSIS — Z7189 Other specified counseling: Secondary | ICD-10-CM | POA: Diagnosis not present

## 2022-07-25 DIAGNOSIS — R6521 Severe sepsis with septic shock: Secondary | ICD-10-CM | POA: Diagnosis present

## 2022-07-25 DIAGNOSIS — Z515 Encounter for palliative care: Secondary | ICD-10-CM | POA: Diagnosis not present

## 2022-07-25 DIAGNOSIS — A499 Bacterial infection, unspecified: Secondary | ICD-10-CM | POA: Diagnosis not present

## 2022-07-25 DIAGNOSIS — R609 Edema, unspecified: Secondary | ICD-10-CM | POA: Diagnosis present

## 2022-07-25 DIAGNOSIS — J90 Pleural effusion, not elsewhere classified: Secondary | ICD-10-CM | POA: Diagnosis not present

## 2022-07-25 DIAGNOSIS — M00059 Staphylococcal arthritis, unspecified hip: Secondary | ICD-10-CM | POA: Diagnosis not present

## 2022-07-25 DIAGNOSIS — Z993 Dependence on wheelchair: Secondary | ICD-10-CM

## 2022-07-25 DIAGNOSIS — M8638 Chronic multifocal osteomyelitis, other site: Secondary | ICD-10-CM | POA: Diagnosis not present

## 2022-07-25 DIAGNOSIS — Q059 Spina bifida, unspecified: Secondary | ICD-10-CM | POA: Diagnosis not present

## 2022-07-25 DIAGNOSIS — R059 Cough, unspecified: Secondary | ICD-10-CM | POA: Diagnosis present

## 2022-07-25 DIAGNOSIS — R509 Fever, unspecified: Secondary | ICD-10-CM | POA: Diagnosis not present

## 2022-07-25 DIAGNOSIS — Z1611 Resistance to penicillins: Secondary | ICD-10-CM | POA: Diagnosis present

## 2022-07-25 DIAGNOSIS — Z6826 Body mass index (BMI) 26.0-26.9, adult: Secondary | ICD-10-CM

## 2022-07-25 DIAGNOSIS — R5081 Fever presenting with conditions classified elsewhere: Secondary | ICD-10-CM | POA: Diagnosis not present

## 2022-07-25 DIAGNOSIS — Z8249 Family history of ischemic heart disease and other diseases of the circulatory system: Secondary | ICD-10-CM

## 2022-07-25 DIAGNOSIS — L8994 Pressure ulcer of unspecified site, stage 4: Secondary | ICD-10-CM | POA: Diagnosis not present

## 2022-07-25 DIAGNOSIS — Z452 Encounter for adjustment and management of vascular access device: Secondary | ICD-10-CM | POA: Diagnosis not present

## 2022-07-25 DIAGNOSIS — K668 Other specified disorders of peritoneum: Secondary | ICD-10-CM | POA: Diagnosis not present

## 2022-07-25 DIAGNOSIS — J45909 Unspecified asthma, uncomplicated: Secondary | ICD-10-CM | POA: Diagnosis not present

## 2022-07-25 DIAGNOSIS — M8660 Other chronic osteomyelitis, unspecified site: Secondary | ICD-10-CM | POA: Diagnosis not present

## 2022-07-25 DIAGNOSIS — L89154 Pressure ulcer of sacral region, stage 4: Secondary | ICD-10-CM | POA: Diagnosis not present

## 2022-07-25 DIAGNOSIS — W050XXA Fall from non-moving wheelchair, initial encounter: Secondary | ICD-10-CM | POA: Diagnosis present

## 2022-07-25 DIAGNOSIS — R590 Localized enlarged lymph nodes: Secondary | ICD-10-CM | POA: Diagnosis not present

## 2022-07-25 DIAGNOSIS — N200 Calculus of kidney: Secondary | ICD-10-CM | POA: Diagnosis not present

## 2022-07-25 DIAGNOSIS — R161 Splenomegaly, not elsewhere classified: Secondary | ICD-10-CM | POA: Diagnosis not present

## 2022-07-25 DIAGNOSIS — S299XXA Unspecified injury of thorax, initial encounter: Secondary | ICD-10-CM | POA: Diagnosis not present

## 2022-07-25 DIAGNOSIS — Z48815 Encounter for surgical aftercare following surgery on the digestive system: Secondary | ICD-10-CM | POA: Diagnosis not present

## 2022-07-25 DIAGNOSIS — Z48817 Encounter for surgical aftercare following surgery on the skin and subcutaneous tissue: Secondary | ICD-10-CM | POA: Diagnosis not present

## 2022-07-25 DIAGNOSIS — N210 Calculus in bladder: Secondary | ICD-10-CM | POA: Diagnosis not present

## 2022-07-25 DIAGNOSIS — R7881 Bacteremia: Secondary | ICD-10-CM | POA: Diagnosis present

## 2022-07-25 DIAGNOSIS — L988 Other specified disorders of the skin and subcutaneous tissue: Secondary | ICD-10-CM | POA: Diagnosis present

## 2022-07-25 DIAGNOSIS — I10 Essential (primary) hypertension: Secondary | ICD-10-CM | POA: Diagnosis present

## 2022-07-25 DIAGNOSIS — Z5986 Financial insecurity: Secondary | ICD-10-CM

## 2022-07-25 DIAGNOSIS — B952 Enterococcus as the cause of diseases classified elsewhere: Secondary | ICD-10-CM | POA: Diagnosis not present

## 2022-07-25 DIAGNOSIS — Z433 Encounter for attention to colostomy: Secondary | ICD-10-CM | POA: Diagnosis not present

## 2022-07-25 DIAGNOSIS — K029 Dental caries, unspecified: Secondary | ICD-10-CM | POA: Diagnosis present

## 2022-07-25 DIAGNOSIS — Z7901 Long term (current) use of anticoagulants: Secondary | ICD-10-CM

## 2022-07-25 DIAGNOSIS — L98429 Non-pressure chronic ulcer of back with unspecified severity: Secondary | ICD-10-CM

## 2022-07-25 DIAGNOSIS — R0682 Tachypnea, not elsewhere classified: Secondary | ICD-10-CM | POA: Diagnosis not present

## 2022-07-25 DIAGNOSIS — L8945 Pressure ulcer of contiguous site of back, buttock and hip, unstageable: Secondary | ICD-10-CM

## 2022-07-25 DIAGNOSIS — I82611 Acute embolism and thrombosis of superficial veins of right upper extremity: Secondary | ICD-10-CM | POA: Diagnosis not present

## 2022-07-25 DIAGNOSIS — L89159 Pressure ulcer of sacral region, unspecified stage: Secondary | ICD-10-CM | POA: Diagnosis not present

## 2022-07-25 DIAGNOSIS — R918 Other nonspecific abnormal finding of lung field: Secondary | ICD-10-CM | POA: Diagnosis not present

## 2022-07-25 DIAGNOSIS — G8929 Other chronic pain: Secondary | ICD-10-CM | POA: Diagnosis not present

## 2022-07-25 DIAGNOSIS — R0989 Other specified symptoms and signs involving the circulatory and respiratory systems: Secondary | ICD-10-CM | POA: Diagnosis not present

## 2022-07-25 DIAGNOSIS — R112 Nausea with vomiting, unspecified: Secondary | ICD-10-CM | POA: Diagnosis not present

## 2022-07-25 DIAGNOSIS — K4090 Unilateral inguinal hernia, without obstruction or gangrene, not specified as recurrent: Secondary | ICD-10-CM | POA: Diagnosis not present

## 2022-07-25 DIAGNOSIS — I340 Nonrheumatic mitral (valve) insufficiency: Secondary | ICD-10-CM | POA: Diagnosis not present

## 2022-07-25 DIAGNOSIS — Z79899 Other long term (current) drug therapy: Secondary | ICD-10-CM

## 2022-07-25 DIAGNOSIS — M868X8 Other osteomyelitis, other site: Secondary | ICD-10-CM | POA: Diagnosis not present

## 2022-07-25 DIAGNOSIS — Z538 Procedure and treatment not carried out for other reasons: Secondary | ICD-10-CM | POA: Diagnosis not present

## 2022-07-25 DIAGNOSIS — R066 Hiccough: Secondary | ICD-10-CM | POA: Diagnosis not present

## 2022-07-25 DIAGNOSIS — Z881 Allergy status to other antibiotic agents status: Secondary | ICD-10-CM

## 2022-07-25 DIAGNOSIS — Z7401 Bed confinement status: Secondary | ICD-10-CM | POA: Diagnosis not present

## 2022-07-25 DIAGNOSIS — N319 Neuromuscular dysfunction of bladder, unspecified: Secondary | ICD-10-CM | POA: Diagnosis present

## 2022-07-25 DIAGNOSIS — R000 Tachycardia, unspecified: Secondary | ICD-10-CM | POA: Diagnosis not present

## 2022-07-25 DIAGNOSIS — L02416 Cutaneous abscess of left lower limb: Secondary | ICD-10-CM | POA: Diagnosis not present

## 2022-07-25 HISTORY — DX: Bacteremia: R78.81

## 2022-07-25 HISTORY — DX: Acute and subacute infective endocarditis: I33.0

## 2022-07-25 LAB — URINALYSIS, W/ REFLEX TO CULTURE (INFECTION SUSPECTED)
Bilirubin Urine: NEGATIVE
Glucose, UA: NEGATIVE mg/dL
Ketones, ur: NEGATIVE mg/dL
Nitrite: POSITIVE — AB
Protein, ur: 30 mg/dL — AB
Specific Gravity, Urine: 1.01 (ref 1.005–1.030)
pH: 8.5 — ABNORMAL HIGH (ref 5.0–8.0)

## 2022-07-25 LAB — CBC WITH DIFFERENTIAL/PLATELET
Abs Immature Granulocytes: 0 10*3/uL (ref 0.00–0.07)
Basophils Absolute: 0 10*3/uL (ref 0.0–0.1)
Basophils Relative: 0 %
Eosinophils Absolute: 0.2 10*3/uL (ref 0.0–0.5)
Eosinophils Relative: 1 %
HCT: 28.2 % — ABNORMAL LOW (ref 39.0–52.0)
Hemoglobin: 8.3 g/dL — ABNORMAL LOW (ref 13.0–17.0)
Lymphocytes Relative: 3 %
Lymphs Abs: 0.5 10*3/uL — ABNORMAL LOW (ref 0.7–4.0)
MCH: 20.1 pg — ABNORMAL LOW (ref 26.0–34.0)
MCHC: 29.4 g/dL — ABNORMAL LOW (ref 30.0–36.0)
MCV: 68.4 fL — ABNORMAL LOW (ref 80.0–100.0)
Monocytes Absolute: 0.7 10*3/uL (ref 0.1–1.0)
Monocytes Relative: 4 %
Neutro Abs: 15.4 10*3/uL — ABNORMAL HIGH (ref 1.7–7.7)
Neutrophils Relative %: 92 %
Platelets: 280 10*3/uL (ref 150–400)
RBC: 4.12 MIL/uL — ABNORMAL LOW (ref 4.22–5.81)
RDW: 18 % — ABNORMAL HIGH (ref 11.5–15.5)
WBC: 16.7 10*3/uL — ABNORMAL HIGH (ref 4.0–10.5)
nRBC: 0 % (ref 0.0–0.2)
nRBC: 1 /100 WBC — ABNORMAL HIGH

## 2022-07-25 LAB — BLOOD CULTURE ID PANEL (REFLEXED) - BCID2
A.calcoaceticus-baumannii: NOT DETECTED
Bacteroides fragilis: NOT DETECTED
CTX-M ESBL: NOT DETECTED
Candida albicans: NOT DETECTED
Candida auris: NOT DETECTED
Candida glabrata: NOT DETECTED
Candida krusei: NOT DETECTED
Candida parapsilosis: NOT DETECTED
Candida tropicalis: NOT DETECTED
Carbapenem resist OXA 48 LIKE: NOT DETECTED
Carbapenem resistance IMP: NOT DETECTED
Carbapenem resistance KPC: NOT DETECTED
Carbapenem resistance NDM: NOT DETECTED
Carbapenem resistance VIM: NOT DETECTED
Cryptococcus neoformans/gattii: NOT DETECTED
Enterobacter cloacae complex: NOT DETECTED
Enterobacterales: DETECTED — AB
Enterococcus Faecium: NOT DETECTED
Enterococcus faecalis: NOT DETECTED
Escherichia coli: NOT DETECTED
Haemophilus influenzae: NOT DETECTED
Klebsiella aerogenes: NOT DETECTED
Klebsiella oxytoca: NOT DETECTED
Klebsiella pneumoniae: NOT DETECTED
Listeria monocytogenes: NOT DETECTED
Neisseria meningitidis: NOT DETECTED
Proteus species: DETECTED — AB
Pseudomonas aeruginosa: NOT DETECTED
Salmonella species: NOT DETECTED
Serratia marcescens: NOT DETECTED
Staphylococcus aureus (BCID): NOT DETECTED
Staphylococcus epidermidis: NOT DETECTED
Staphylococcus lugdunensis: NOT DETECTED
Staphylococcus species: NOT DETECTED
Stenotrophomonas maltophilia: NOT DETECTED
Streptococcus agalactiae: NOT DETECTED
Streptococcus pneumoniae: NOT DETECTED
Streptococcus pyogenes: NOT DETECTED
Streptococcus species: DETECTED — AB

## 2022-07-25 LAB — BASIC METABOLIC PANEL
Anion gap: 16 — ABNORMAL HIGH (ref 5–15)
BUN: 30 mg/dL — ABNORMAL HIGH (ref 6–20)
CO2: 24 mmol/L (ref 22–32)
Calcium: 8.1 mg/dL — ABNORMAL LOW (ref 8.9–10.3)
Chloride: 98 mmol/L (ref 98–111)
Creatinine, Ser: 1.02 mg/dL (ref 0.61–1.24)
GFR, Estimated: >60 mL/min (ref >60)
Glucose, Bld: 107 mg/dL — ABNORMAL HIGH (ref 70–99)
Potassium: 2.9 mmol/L — ABNORMAL LOW (ref 3.5–5.1)
Sodium: 138 mmol/L (ref 135–145)

## 2022-07-25 LAB — COMPREHENSIVE METABOLIC PANEL
ALT: 43 U/L (ref 0–44)
AST: 60 U/L — ABNORMAL HIGH (ref 15–41)
Albumin: 1.6 g/dL — ABNORMAL LOW (ref 3.5–5.0)
Alkaline Phosphatase: 274 U/L — ABNORMAL HIGH (ref 38–126)
Anion gap: 14 (ref 5–15)
BUN: 40 mg/dL — ABNORMAL HIGH (ref 6–20)
CO2: 24 mmol/L (ref 22–32)
Calcium: 7.9 mg/dL — ABNORMAL LOW (ref 8.9–10.3)
Chloride: 95 mmol/L — ABNORMAL LOW (ref 98–111)
Creatinine, Ser: 1.38 mg/dL — ABNORMAL HIGH (ref 0.61–1.24)
GFR, Estimated: >60 mL/min (ref >60)
Glucose, Bld: 121 mg/dL — ABNORMAL HIGH (ref 70–99)
Potassium: 2.7 mmol/L — CL (ref 3.5–5.1)
Sodium: 133 mmol/L — ABNORMAL LOW (ref 135–145)
Total Bilirubin: 0.5 mg/dL (ref 0.3–1.2)
Total Protein: 6.8 g/dL (ref 6.5–8.1)

## 2022-07-25 LAB — I-STAT CHEM 8, ED
BUN: 34 mg/dL — ABNORMAL HIGH (ref 6–20)
BUN: 38 mg/dL — ABNORMAL HIGH (ref 6–20)
Calcium, Ion: 1.03 mmol/L — ABNORMAL LOW (ref 1.15–1.40)
Calcium, Ion: 1.02 mmol/L — ABNORMAL LOW (ref 1.15–1.40)
Chloride: 98 mmol/L (ref 98–111)
Chloride: 99 mmol/L (ref 98–111)
Creatinine, Ser: 1.4 mg/dL — ABNORMAL HIGH (ref 0.61–1.24)
Creatinine, Ser: 1.4 mg/dL — ABNORMAL HIGH (ref 0.61–1.24)
Glucose, Bld: 122 mg/dL — ABNORMAL HIGH (ref 70–99)
Glucose, Bld: 121 mg/dL — ABNORMAL HIGH (ref 70–99)
HCT: 27 % — ABNORMAL LOW (ref 39.0–52.0)
HCT: 27 % — ABNORMAL LOW (ref 39.0–52.0)
Hemoglobin: 9.2 g/dL — ABNORMAL LOW (ref 13.0–17.0)
Hemoglobin: 9.2 g/dL — ABNORMAL LOW (ref 13.0–17.0)
Potassium: 2.5 mmol/L — CL (ref 3.5–5.1)
Potassium: 2.6 mmol/L — CL (ref 3.5–5.1)
Sodium: 134 mmol/L — ABNORMAL LOW (ref 135–145)
Sodium: 134 mmol/L — ABNORMAL LOW (ref 135–145)
TCO2: 25 mmol/L (ref 22–32)
TCO2: 27 mmol/L (ref 22–32)

## 2022-07-25 LAB — APTT: aPTT: 33 seconds (ref 24–36)

## 2022-07-25 LAB — LACTIC ACID, PLASMA
Lactic Acid, Venous: 2.1 mmol/L (ref 0.5–1.9)
Lactic Acid, Venous: 1.9 mmol/L (ref 0.5–1.9)

## 2022-07-25 LAB — PROTIME-INR
INR: 1.4 — ABNORMAL HIGH (ref 0.8–1.2)
Prothrombin Time: 16.9 seconds — ABNORMAL HIGH (ref 11.4–15.2)

## 2022-07-25 LAB — RESP PANEL BY RT-PCR (RSV, FLU A&B, COVID)  RVPGX2
Influenza A by PCR: NEGATIVE
Influenza B by PCR: NEGATIVE
Resp Syncytial Virus by PCR: NEGATIVE
SARS Coronavirus 2 by RT PCR: NEGATIVE

## 2022-07-25 LAB — CULTURE, BLOOD (ROUTINE X 2)

## 2022-07-25 MED ORDER — NOREPINEPHRINE 4 MG/250ML-% IV SOLN
0.0000 ug/min | INTRAVENOUS | Status: DC
  Filled 2022-07-25: qty 250

## 2022-07-25 MED ORDER — DAKINS (1/4 STRENGTH) 0.125 % EX SOLN
Freq: Every day | CUTANEOUS | Status: AC
  Filled 2022-07-25 (×3): qty 473

## 2022-07-25 MED ORDER — PROCHLORPERAZINE EDISYLATE 10 MG/2ML IJ SOLN
10.0000 mg | Freq: Four times a day (QID) | INTRAMUSCULAR | Status: DC | PRN
  Administered 2022-07-30 – 2022-08-21 (×8): 10 mg via INTRAVENOUS
  Filled 2022-07-25 (×12): qty 2

## 2022-07-25 MED ORDER — ROSUVASTATIN CALCIUM 20 MG PO TABS: 40.0000 mg | ORAL_TABLET | Freq: Every day | ORAL | Status: DC

## 2022-07-25 MED ORDER — LACTATED RINGERS IV BOLUS (SEPSIS)
1000.0000 mL | Freq: Once | INTRAVENOUS | Status: AC
  Administered 2022-07-25: 1000 mL via INTRAVENOUS

## 2022-07-25 MED ORDER — ACETAMINOPHEN 500 MG PO TABS: 500.0000 mg | ORAL_TABLET | ORAL | Status: DC

## 2022-07-25 MED ORDER — ENOXAPARIN SODIUM 40 MG/0.4ML IJ SOSY
40.0000 mg | PREFILLED_SYRINGE | INTRAMUSCULAR | Status: DC
  Administered 2022-07-25 – 2022-07-26 (×2): 40 mg via SUBCUTANEOUS
  Filled 2022-07-25 (×2): qty 0.4

## 2022-07-25 MED ORDER — METRONIDAZOLE 500 MG/100ML IV SOLN
500.0000 mg | Freq: Two times a day (BID) | INTRAVENOUS | Status: DC
  Administered 2022-07-25 – 2022-07-28 (×6): 500 mg via INTRAVENOUS
  Filled 2022-07-25 (×6): qty 100

## 2022-07-25 MED ORDER — POTASSIUM CHLORIDE CRYS ER 20 MEQ PO TBCR
40.0000 meq | EXTENDED_RELEASE_TABLET | Freq: Once | ORAL | Status: AC
  Administered 2022-07-25: 40 meq via ORAL
  Filled 2022-07-25: qty 2

## 2022-07-25 MED ORDER — ACETAMINOPHEN 325 MG PO TABS
650.0000 mg | ORAL_TABLET | Freq: Four times a day (QID) | ORAL | Status: DC
  Administered 2022-07-25: 650 mg via ORAL
  Filled 2022-07-25: qty 2

## 2022-07-25 MED ORDER — MORPHINE SULFATE (PF) 4 MG/ML IV SOLN
4.0000 mg | Freq: Once | INTRAVENOUS | Status: AC
  Administered 2022-07-25: 4 mg via INTRAVENOUS
  Filled 2022-07-25: qty 1

## 2022-07-25 MED ORDER — LACTATED RINGERS IV SOLN: INTRAVENOUS | Status: DC

## 2022-07-25 MED ORDER — ACETAMINOPHEN 500 MG PO TABS
500.0000 mg | ORAL_TABLET | Freq: Four times a day (QID) | ORAL | Status: DC
  Administered 2022-07-25 – 2022-08-02 (×30): 500 mg via ORAL
  Filled 2022-07-25 (×31): qty 1

## 2022-07-25 MED ORDER — LACTATED RINGERS IV BOLUS (SEPSIS)
500.0000 mL | Freq: Once | INTRAVENOUS | Status: AC
  Administered 2022-07-25: 500 mL via INTRAVENOUS

## 2022-07-25 MED ORDER — FENTANYL CITRATE PF 50 MCG/ML IJ SOSY
50.0000 ug | PREFILLED_SYRINGE | Freq: Once | INTRAMUSCULAR | Status: AC
  Administered 2022-07-25: 50 ug via INTRAVENOUS
  Filled 2022-07-25: qty 1

## 2022-07-25 MED ORDER — OXYCODONE HCL 5 MG PO TABS
5.0000 mg | ORAL_TABLET | ORAL | Status: DC | PRN
  Administered 2022-07-28: 5 mg via ORAL
  Filled 2022-07-25: qty 1

## 2022-07-25 MED ORDER — SODIUM CHLORIDE 0.9 % IV SOLN
2.0000 g | Freq: Once | INTRAVENOUS | Status: AC
  Administered 2022-07-25: 2 g via INTRAVENOUS
  Filled 2022-07-25: qty 12.5

## 2022-07-25 MED ORDER — LIDOCAINE 5 % EX PTCH
1.0000 | MEDICATED_PATCH | CUTANEOUS | Status: DC
  Administered 2022-07-25 – 2022-08-08 (×13): 1 via TRANSDERMAL
  Administered 2022-08-09 – 2022-08-11 (×3): 2 via TRANSDERMAL
  Administered 2022-08-12 – 2022-08-31 (×8): 1 via TRANSDERMAL
  Filled 2022-07-25 (×5): qty 1
  Filled 2022-07-25: qty 2
  Filled 2022-07-25 (×8): qty 1
  Filled 2022-07-25: qty 2
  Filled 2022-07-25 (×15): qty 1
  Filled 2022-07-25: qty 2
  Filled 2022-07-25 (×3): qty 1
  Filled 2022-07-25: qty 2

## 2022-07-25 MED ORDER — POTASSIUM CHLORIDE 10 MEQ/100ML IV SOLN
10.0000 meq | Freq: Once | INTRAVENOUS | Status: AC
  Administered 2022-07-25: 10 meq via INTRAVENOUS
  Filled 2022-07-25: qty 100

## 2022-07-25 MED ORDER — FLECAINIDE ACETATE 50 MG PO TABS
50.0000 mg | ORAL_TABLET | Freq: Two times a day (BID) | ORAL | Status: DC
  Administered 2022-07-25 – 2022-09-01 (×75): 50 mg via ORAL
  Filled 2022-07-25 (×79): qty 1

## 2022-07-25 MED ORDER — IBUPROFEN 400 MG PO TABS
600.0000 mg | ORAL_TABLET | Freq: Once | ORAL | Status: AC
  Administered 2022-07-25: 600 mg via ORAL
  Filled 2022-07-25: qty 2

## 2022-07-25 MED ORDER — IOHEXOL 350 MG/ML SOLN
75.0000 mL | Freq: Once | INTRAVENOUS | Status: AC | PRN
  Administered 2022-07-25: 75 mL via INTRAVENOUS

## 2022-07-25 MED ORDER — METHOCARBAMOL 500 MG PO TABS
1000.0000 mg | ORAL_TABLET | Freq: Three times a day (TID) | ORAL | Status: DC | PRN
  Administered 2022-07-25 – 2022-07-28 (×5): 1000 mg via ORAL
  Filled 2022-07-25 (×5): qty 2

## 2022-07-25 MED ORDER — ACETAMINOPHEN 325 MG PO TABS: 650.0000 mg | ORAL_TABLET | ORAL | Status: DC | PRN

## 2022-07-25 MED ORDER — POLYETHYLENE GLYCOL 3350 17 G PO PACK: 17.0000 g | PACK | Freq: Every day | ORAL | Status: DC | PRN

## 2022-07-25 MED ORDER — POTASSIUM CHLORIDE 10 MEQ/100ML IV SOLN
10.0000 meq | INTRAVENOUS | Status: AC
  Administered 2022-07-25 (×3): 10 meq via INTRAVENOUS
  Filled 2022-07-25 (×3): qty 100

## 2022-07-25 MED ORDER — METRONIDAZOLE 500 MG/100ML IV SOLN
500.0000 mg | Freq: Once | INTRAVENOUS | Status: AC
  Administered 2022-07-25: 500 mg via INTRAVENOUS
  Filled 2022-07-25: qty 100

## 2022-07-25 MED ORDER — DICLOFENAC SODIUM 1 % EX GEL
4.0000 g | Freq: Four times a day (QID) | CUTANEOUS | Status: DC | PRN
  Administered 2022-07-26 – 2022-08-27 (×4): 4 g via TOPICAL
  Filled 2022-07-25: qty 100

## 2022-07-25 MED ORDER — SODIUM CHLORIDE 0.9 % IV SOLN
2.0000 g | Freq: Two times a day (BID) | INTRAVENOUS | Status: DC
  Administered 2022-07-25 – 2022-07-26 (×2): 2 g via INTRAVENOUS
  Filled 2022-07-25 (×2): qty 12.5

## 2022-07-25 MED ORDER — SODIUM CHLORIDE 0.9 % IV SOLN
8.0000 mg/kg | Freq: Every day | INTRAVENOUS | Status: DC
  Administered 2022-07-25 – 2022-08-13 (×20): 650 mg via INTRAVENOUS
  Filled 2022-07-25 (×21): qty 13

## 2022-07-25 MED ORDER — OXYCODONE HCL 5 MG PO TABS
10.0000 mg | ORAL_TABLET | ORAL | Status: DC | PRN
  Administered 2022-07-25 – 2022-07-30 (×12): 10 mg via ORAL
  Filled 2022-07-25 (×13): qty 2

## 2022-07-25 NOTE — Progress Notes (Signed)
FMTS Interim Progress Note  S:Called to bedside for red MEWS. Patient alert and awake without complaint.   O: BP (!) 80/59 (BP Location: Left Arm)   Pulse (!) 126   Temp (!) 102.4 F (39.1 C) (Oral)   Resp 20   Ht 5\' 8"  (1.727 m)   Wt 79.4 kg   SpO2 92%   BMI 26.61 kg/m   In Good spirits, communicative, clear sensorium Tachycardic without murmur  A/P: Sepsis 2/2 Decubitus ulcers Has not yet received daptomycin, has grown enterococcus in the past which is not covered on the abx already received. - RN to call pharmacy regarding getting daptomycin to bedside - Will start LR 125mL/hr - Made Tylenol q4h PRN - AKI on admission makes him a poor candidate for NSAIDs at this time, repeat BMP pending, if improved renal function could consider NSAID for antipyresis   [name], DO [facility], PGY-1 07/25/22 5:20 PM   Service pager [phone]

## 2022-07-25 NOTE — Assessment & Plan Note (Signed)
RESOLVED. AM BMP showed resolution with K+ 3.7 - continue daily BMP

## 2022-07-25 NOTE — Progress Notes (Signed)
PHARMACY - PHYSICIAN COMMUNICATION CRITICAL VALUE ALERT - BLOOD CULTURE IDENTIFICATION (BCID)  Anthony [name] is an 52 y.o. male who presented to [facility] on 07/25/2022 with a chief complaint of weakness and chills.  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  Name of physician (or Provider) Contacted: MDameron DO and MBaloch MD  Current antibiotics: daptomycin, cefepime, and metronidazole  Changes to prescribed antibiotics recommended:  Could consider narrowing to ceftriaxone 2g IV Q24H; team awaiting ID recs.  Results for orders placed or performed during the hospital encounter of 07/25/22  Blood Culture ID Panel (Reflexed) (Collected: 07/25/2022  8:21 AM)  Result Value Ref Range   Enterococcus faecalis NOT DETECTED NOT DETECTED   Enterococcus Faecium NOT DETECTED NOT DETECTED   Listeria monocytogenes NOT DETECTED NOT DETECTED   Staphylococcus species NOT DETECTED NOT DETECTED   Staphylococcus aureus (BCID) NOT DETECTED NOT DETECTED   Staphylococcus epidermidis NOT DETECTED NOT DETECTED   Staphylococcus lugdunensis NOT DETECTED NOT DETECTED   Streptococcus species DETECTED (A) NOT DETECTED   Streptococcus agalactiae NOT DETECTED NOT DETECTED   Streptococcus pneumoniae NOT DETECTED NOT DETECTED   Streptococcus pyogenes NOT DETECTED NOT DETECTED   A.calcoaceticus-baumannii NOT DETECTED NOT DETECTED   Bacteroides fragilis NOT DETECTED NOT DETECTED   Enterobacterales DETECTED (A) NOT DETECTED   Enterobacter cloacae complex NOT DETECTED NOT DETECTED   Escherichia coli NOT DETECTED NOT DETECTED   Klebsiella aerogenes NOT DETECTED NOT DETECTED   Klebsiella oxytoca NOT DETECTED NOT DETECTED   Klebsiella pneumoniae NOT DETECTED NOT DETECTED   Proteus species DETECTED (A) NOT DETECTED   Salmonella species NOT DETECTED NOT DETECTED   Serratia marcescens NOT DETECTED NOT DETECTED   Haemophilus influenzae  NOT DETECTED NOT DETECTED   Neisseria meningitidis NOT DETECTED NOT DETECTED   Pseudomonas aeruginosa NOT DETECTED NOT DETECTED   Stenotrophomonas maltophilia NOT DETECTED NOT DETECTED   Candida albicans NOT DETECTED NOT DETECTED   Candida auris NOT DETECTED NOT DETECTED   Candida glabrata NOT DETECTED NOT DETECTED   Candida krusei NOT DETECTED NOT DETECTED   Candida parapsilosis NOT DETECTED NOT DETECTED   Candida tropicalis NOT DETECTED NOT DETECTED   Cryptococcus neoformans/gattii NOT DETECTED NOT DETECTED   CTX-M ESBL NOT DETECTED NOT DETECTED   Carbapenem resistance IMP NOT DETECTED NOT DETECTED   Carbapenem resistance KPC NOT DETECTED NOT DETECTED   Carbapenem resistance NDM NOT DETECTED NOT DETECTED   Carbapenem resist OXA 48 LIKE NOT DETECTED NOT DETECTED   Carbapenem resistance VIM NOT DETECTED NOT DETECTED    Vernard Gambles, PharmD, BCPS  07/25/2022  11:12 PM

## 2022-07-25 NOTE — Progress Notes (Addendum)
FMTS Interim Progress Note  S: Went to the bedside on nighttime rounds with Dr. Georg Ruddle.  Patient resting comfortably in bed.  He is reporting he is "feeling much better than earlier."  His only complaint is right shoulder pain, which started after he fell prior to coming to the hospital. He denies any other complaints.  He feels his breathing is normal.  O: Blood pressure (!) 95/55, pulse (!) 98, temperature 98.4 F (36.9 C), temperature source Oral, resp. rate 20, height 5\' 8"  (1.727 m), weight 79.4 kg, SpO2 92 %.  General: Lying in bed no distress CV: Regular rate and rhythm, heart rate 90s Respiratory: Normal work of breathing on room air.  Clear anteriorly Neuro: A&O x 4  A/P: Sepsis secondary to sacral decubitus ulcer Stable and seems to be somewhat improving. HR lowering to 90s and afebrile.  IV fluid resuscitation is ordered and running at 105 mL/hour (s/p 2.5 L in ED) -On broad-spectrum antibiotics (daptomycin, Flagyl, cefepime) all of which are running appropriately. Blood culture preliminary results show gram-positive cocci and gram-positive rods.  Awaiting BC ID. -Continue Tylenol 500 scheduled every 6 hours  Will closely monitor vital signs overnight.  Goal blood pressure MAP greater than 65. If he has any clinical deterioration, or fevers do not respond to antipyretics with continued hypotension despite fluids, will plan to reach back out to ICU to escalate level of care.  At this time, he remains floor appropriate.  RN to page with any concerns.  Right shoulder pain X-ray reviewed.  No fractures or dislocation.  He does have a bone spur as well as mild osteoarthritis. -Encouraged him to move his shoulder with flexion, extension and abduction as tolerated. -Will add lidocaine patch. -Apply ice as needed, for 15 minutes at a time. -Also encouraged to take pain medication as needed.  Darral Dash, DO 07/25/2022, 9:55 PM PGY-3, [facility] Service pager  [phone]

## 2022-07-25 NOTE — Progress Notes (Signed)
Pharmacy Antibiotic Note  BRIC RARDIN is a 52 y.o. male admitted on 07/25/2022 with sepsis.  Pharmacy has been consulted for Cefepime dosing. Afeb. WBC 16.7. Scr 1.38. Flagyl per MD.  Plan: Cefepime 2 g every 12 hours Monitor vitals, SCr, CBC, and for clinical signs of improvement  F/u cultures and de-escalate antibiotics as indicated  Height: 5\' 8"  (172.7 cm) Weight: 79.4 kg (175 lb) IBW/kg (Calculated) : 68.4  Temp (24hrs), Avg:98.4 F (36.9 C), Min:98.4 F (36.9 C), Max:98.4 F (36.9 C)  Recent Labs  Lab 07/25/22 0821 07/25/22 0829 07/25/22 0833  WBC 16.7*  --   --   CREATININE 1.38* 1.40* 1.40*  LATICACIDVEN 2.1*  --   --     Estimated Creatinine Clearance: 59.7 mL/min (A) (by C-G formula based on SCr of 1.4 mg/dL (H)).    Allergies  Allergen Reactions   Vancomycin Itching    Antimicrobials this admission: Cefepime 7/5 >>  Microbiology results: 7/5 BCx:  7/5 Aerobic/Anaerobic Cx:   Thank you for allowing pharmacy to be a part of this patient's care.  Laqueta Jean PharmD Candidate 07/25/2022 9:24 AM

## 2022-07-25 NOTE — Assessment & Plan Note (Signed)
S/p fall last Wednesday/Thursday. Has tried OTC meds. XR showed no signs of fracture or dislocation, only positive for AC osteoarthritis and subacromial spurring.  - Oxycodone 5 mg for mild pain and 10 mg for breakthrough/severe pain

## 2022-07-25 NOTE — Assessment & Plan Note (Addendum)
Patient reports cleaning and packing his own wounds regularly. Multiple wounds noted, but exam was limited due to pain, pictures were documented in media tab. Ortho is planning to medically manage over the weekend and reassess on Monday. - Consult wound care and ortho

## 2022-07-25 NOTE — Assessment & Plan Note (Addendum)
Of note K+ 2.7, will replete with 40 mEq IV K+. - BMP recheck 5pm - AM BMP - Suspicious for malnutrition, consider RD consult

## 2022-07-25 NOTE — Assessment & Plan Note (Addendum)
Patient reports no dysuria. UA positive for leukocytes and nitrites, seems to be patient's normal urine. He has a foley catheter that he replaces himself monthly. Last time it was changed was a week ago. - Urine culture pending

## 2022-07-25 NOTE — Consult Note (Addendum)
Date of Admission:  07/25/2022          Reason for Consult:   Sepsis with concern for bilateral septic arthritis of the hips underneath decubitus ulcers subcutaneous gas in the soft tissue along the gluteal fold with signs of chronic osteomyelitis and the pubic rami with worsening destruction   Referring Provider: Pearlean Brownie, MD   Assessment:  Possible sepsis due to bilateral septic hips underlying decubitus ulcers Gas in the subcutaneous tissues along the left gluteal fold toward the base of the penis Chronic osteomyelitis to increase sclerosis and bony destruction of bilateral pubic rami 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 [name] that the patient is reluctant to be seen by Dr. [name] If blood cultures are negative then he is stable and no orthopedic surgery procedures planned would recommend stopping antibiotics and asking interventional radiology to aspirate the hips Check 2D echocardiogram  Principal Problem:   Pyogenic arthritis of left hip (HCC) Active Problems:   Right shoulder pain   Decubitus ulcer due to spina bifida (HCC)   Hypokalemia   Sepsis (HCC)   Asymptomatic bacteriuria   Scheduled Meds:  enoxaparin (LOVENOX) injection  40 mg Subcutaneous Q24H   flecainide  50 mg Oral BID   sodium hypochlorite   Irrigation Daily   Continuous Infusions:  ceFEPime (MAXIPIME) IV     DAPTOmycin (CUBICIN) 650 mg in sodium chloride 0.9 % IVPB 650 mg (07/25/22 1753)   lactated ringers 125 mL/hr at 07/25/22 1712   potassium chloride 10 mEq (07/25/22 1747)   PRN Meds:.acetaminophen, diclofenac Sodium, methocarbamol, oxyCODONE **OR** oxyCODONE, polyethylene glycol, prochlorperazine  HPI: Anthony [name] is a 52 y.o. male  with spinal bifida paraplegia with chronic decubitus ulcers and extensive infectious disease history related to these.   He had most recently been seen by my former partner Dr. Earlene Plater.  Dr. Earlene Plater had recounted patients history this past fall  Patient had been admitted recent at [facility] from 09/03/21 - 09/24/21. This was a very complicated admission where he dealt with left hip septic arthritis/OM.  Status post I&D 8/23, 8/25, and 09/18/2021 with orthopedic surgery. Multiple prior cultures including bone cultures of the left hip grew Enterococcus faecalis and Bacteroides. Additionally, his admission blood cultures grew strep anginosus.  A [name] was done 09/09/21 revealed a mitral valve vegetation.  Ultimately it was challenging to discern which of the offending organism caused endocarditis as Enterococcus faecalis is more likely than strep anginosus to be a typical pathogen to cause endocarditis.  Given this uncertainty, he has been started on antibiotics to treat for endocarditis with synergy.  He received wound care for his chronic pressure ulcers.  He was discharged on ampicillin, ceftriaxone via PICC line for IV therapy.  PO flagyl was also continued.  He was last seen by Dr. Earlene Plater on subdural the 20th 2023.  More recently the patient experienced worsening weakness and lethargy and apparently fell to the floor and struck his shoulder.  Pain became unbearable in his shoulder and he sought care in the emergency department.  The area send to be tachycardic and hypotensive.  He was febrile and fevers have escalated to nearly 103 F blood cultures were obtained CT abdomen pelvis was obtained which showed.  Bilateral decubitus ulcers  involving the posterior aspect of both hips and bilateral ischium with gas and fluid noting the soft tissues surrounding the hips as well as within the hip joints bilaterally concerning for septic arthritis.  There is also soft tissue ulceration and subcutaneous gas and  soft tissue thickening along the left gluteal fold toward the base of the penis.  He has evidence of chronic osteomyelitis increase sclerosis and bony destruction within bilateral pubic rami splenomegaly was also observed he was placed on cefepime and metronidazole.  Vancomycin was not started as an empiric part of his sepsis regimen due to him having severe "red man" syndrome.  I was asked about additional antibiotics to cover for MRSA and recommended initiation of daptomycin.  He has been seen by orthopedic surgery do not feel he has any acute orthopedic surgical interventions though it is recommended that he be evaluated by Dr. [name].  The patient is a bit reticent to be seen by Dr. [name] as he says that he suffered bleeding after one of the most recent surgeries that made his course more protracted.  For now I would continue with cefepime daptomycin and metronidazole while we await blood cultures if blood cultures are sterile I would recommend stopping antibiotics and if he is not having orthopedic surgical intervention asking interventional radiology to aspirate fluid from both hips for cell count differential and culture.  I have personally spent 84 minutes involved in face-to-face and non-face-to-face activities for this patient on the day of the visit. Professional time spent includes the following activities: Preparing to see the patient (review of tests), Obtaining and/or reviewing separately obtained history (admission/discharge record), Performing a medically appropriate examination and/or evaluation , Ordering medications/tests/procedures, referring and communicating with other health care professionals, Documenting clinical information in the EMR, Independently interpreting results (not separately reported), Communicating results to the patient/family/caregiver, Counseling and educating the patient/family/caregiver and Care coordination (not separately reported).      Review of  Systems: Review of Systems  Constitutional:  Positive for fever and malaise/fatigue. Negative for chills and weight loss.  HENT:  Negative for congestion and sore throat.   Eyes:  Negative for blurred vision and photophobia.  Respiratory:  Negative for cough, shortness of breath and wheezing.   Cardiovascular:  Negative for chest pain, palpitations and leg swelling.  Gastrointestinal:  Negative for abdominal pain, blood in stool, constipation, diarrhea, heartburn, melena, nausea and vomiting.  Genitourinary:  Negative for dysuria, flank pain and hematuria.  Musculoskeletal:  Positive for falls and joint pain. Negative for back pain and myalgias.  Skin:  Negative for itching and rash.  Neurological:  Positive for weakness. Negative for dizziness, focal weakness, loss of consciousness and headaches.  Endo/Heme/Allergies:  Does not bruise/bleed easily.  Psychiatric/Behavioral:  Negative for depression and suicidal ideas. The patient does not have insomnia.    Wounds not examined  Past Medical History:  Diagnosis Date   Anxiety    Chronic indwelling Foley catheter    Complication of anesthesia    woken up in surgery before   Dysrhythmia    Endocarditis of mitral valve    Hypertension    Osteomyelitis (HCC)    left hip   Paraplegia (HCC)    secondary to Spina Bifida   PVCs (premature ventricular contractions)    Septic shock (HCC)    due to osteomyelitis   Spina bifida    Wheelchair bound     Social History   Tobacco Use   Smoking status: Former    Types:  Cigarettes   Smokeless tobacco: Never  Vaping Use   Vaping Use: Never used  Substance Use Topics   Alcohol use: No    Family History  Problem Relation Age of Onset   CAD Father        died age 68 from MI   Sudden Cardiac Death Sister        age 35   CAD Maternal Grandfather        died age 47 MI   Allergies  Allergen Reactions   Firvanq [Vancomycin] Itching    OBJECTIVE: Blood pressure (!) 80/59, pulse (!)  126, temperature (!) 102.4 F (39.1 C), temperature source Oral, resp. rate 20, height 5\' 8"  (1.727 m), weight 79.4 kg, SpO2 92 %.  Physical Exam Constitutional:      Appearance: He is well-developed.  HENT:     Head: Normocephalic and atraumatic.  Eyes:     Conjunctiva/sclera: Conjunctivae normal.  Cardiovascular:     Rate and Rhythm: Normal rate and regular rhythm.  Pulmonary:     Effort: Pulmonary effort is normal. No respiratory distress.     Breath sounds: No wheezing.  Abdominal:     General: There is no distension.     Palpations: Abdomen is soft.  Musculoskeletal:        General: No tenderness. Normal range of motion.     Cervical back: Normal range of motion and neck supple.  Skin:    General: Skin is warm and dry.  Neurological:     Mental Status: He is alert and oriented to person, place, and time.     Comments: Paraplegia with contractures  Psychiatric:        Mood and Affect: Mood normal.        Behavior: Behavior normal.        Thought Content: Thought content normal.        Judgment: Judgment normal.     Lab Results Lab Results  Component Value Date   WBC 16.7 (H) 07/25/2022   HGB 9.2 (L) 07/25/2022   HCT 27.0 (L) 07/25/2022   MCV 68.4 (L) 07/25/2022   PLT 280 07/25/2022    Lab Results  Component Value Date   CREATININE 1.02 07/25/2022   BUN 30 (H) 07/25/2022   NA 138 07/25/2022   K 2.9 (L) 07/25/2022   CL 98 07/25/2022   CO2 24 07/25/2022    Lab Results  Component Value Date   ALT 43 07/25/2022   AST 60 (H) 07/25/2022   ALKPHOS 274 (H) 07/25/2022   BILITOT 0.5 07/25/2022     Microbiology: Recent Results (from the past 240 hour(s))  Resp panel by RT-PCR (RSV, Flu A&B, Covid) Anterior Nasal Swab     Status: None   Collection Time: 07/25/22  7:58 AM   Specimen: Anterior Nasal Swab  Result Value Ref Range Status   SARS Coronavirus 2 by RT PCR NEGATIVE NEGATIVE Final   Influenza A by PCR NEGATIVE NEGATIVE Final   Influenza B by PCR  NEGATIVE NEGATIVE Final    Comment: (NOTE) The Xpert Xpress SARS-CoV-2/FLU/RSV plus assay is intended as an aid in the diagnosis of influenza from Nasopharyngeal swab specimens and should not be used as a sole basis for treatment. Nasal washings and aspirates are unacceptable for Xpert Xpress SARS-CoV-2/FLU/RSV testing.  Fact Sheet for Patients: BloggerCourse.com  Fact Sheet for Healthcare Providers: SeriousBroker.it  This test is not yet approved or cleared by the [place] FDA and has been authorized for detection  and/or diagnosis of SARS-CoV-2 by FDA under an Emergency Use Authorization (EUA). This EUA will remain in effect (meaning this test can be used) for the duration of the COVID-19 declaration under Section 564(b)(1) of the Act, 21 U.S.C. section 360bbb-3(b)(1), unless the authorization is terminated or revoked.     Resp Syncytial Virus by PCR NEGATIVE NEGATIVE Final    Comment: (NOTE) Fact Sheet for Patients: BloggerCourse.com  Fact Sheet for Healthcare Providers: SeriousBroker.it  This test is not yet approved or cleared by the [place] FDA and has been authorized for detection and/or diagnosis of SARS-CoV-2 by FDA under an Emergency Use Authorization (EUA). This EUA will remain in effect (meaning this test can be used) for the duration of the COVID-19 declaration under Section 564(b)(1) of the Act, 21 U.S.C. section 360bbb-3(b)(1), unless the authorization is terminated or revoked.  Performed at [facility], [address] [place], [place] [address]     Acey Lav, MD [facility] [phone] pager  07/25/2022, 6:41 PM

## 2022-07-25 NOTE — ED Notes (Signed)
ED TO INPATIENT HANDOFF REPORT  ED Nurse Name and Phone #: [phone]  S Name/Age/Gender Anthony [name] 52 y.o. male Room/Bed: 025C/025C  Code Status   Code Status: Full Code  Home/SNF/Other Home Patient oriented to: self, place, time, and situation Is this baseline? Yes   Triage Complete: Triage complete  Chief Complaint Sepsis [facility]) [A41.9]  Triage Note Pt. Stated, I had fell at home due to weakness and just feeling bad. Ive had some type of infection either from my urine cause I had an abscess in my urethra. Ive had chills, ? Sinus infection. When I fell which was Wednesday, I hurt my right shoulder. I didn't really notice til later.   Allergies Allergies  Allergen Reactions   Firvanq [Vancomycin] Itching    Level of Care/Admitting Diagnosis ED Disposition     ED Disposition  Admit   Condition  --   Comment  Hospital Area: [facility] [[id number]]  Level of Care: Progressive [102]  Admit to Progressive based on following criteria: MULTISYSTEM THREATS such as stable sepsis, metabolic/electrolyte imbalance with or without encephalopathy that is responding to early treatment.  May place patient in observation at [facility] or [facility] if equivalent level of care is available:: Yes  Covid Evaluation: Asymptomatic - no recent exposure (last 10 days) testing not required  Diagnosis: Sepsis [facility]) [[id number]]  Admitting Physician: Carney Living [[id number]]  Attending Physician: Carney Living [[id number]]          B Medical/Surgery History Past Medical History:  Diagnosis Date   Anxiety    Chronic indwelling Foley catheter    Complication of anesthesia    woken up in surgery before   Dysrhythmia    Endocarditis of mitral valve    Hypertension    Osteomyelitis (HCC)    left hip   Paraplegia (HCC)    secondary to Spina Bifida   PVCs (premature ventricular contractions)    Septic shock (HCC)    due to osteomyelitis   Spina bifida     Wheelchair bound    Past Surgical History:  Procedure Laterality Date   APPLICATION OF WOUND VAC  09/11/2021   Procedure: APPLICATION OF WOUND VAC;  Surgeon: [name], MD;  Location: [facility] OR;  Service: Orthopedics;;   BACK SURGERY     BUBBLE STUDY  09/09/2021   Procedure: BUBBLE STUDY;  Surgeon: Quintella Reichert, MD;  Location: [facility] ENDOSCOPY;  Service: Cardiovascular;;   HIP SURGERY     I & D EXTREMITY Left 09/11/2021   Procedure: LEFT HIP DEBRIDEMENT AND REMOVAL FEMORAL HEAD;  Surgeon: [name], MD;  Location: [facility] OR;  Service: Orthopedics;  Laterality: Left;   I & D EXTREMITY Left 09/13/2021   Procedure: DEBRIDEMENT LEFT HIP;  Surgeon: [name], MD;  Location: [facility] OR;  Service: Orthopedics;  Laterality: Left;   I & D EXTREMITY Left 09/18/2021   Procedure: DEBRIDEMENT LEFT HIP, WOUND VAC EXCHANGE;  Surgeon: [name], MD;  Location: [facility] OR;  Service: Orthopedics;  Laterality: Left;   I & D EXTREMITY Left 10/30/2021   Procedure: LEFT HIP DEBRIDEMENT WITH KERECIS PLACEMENT;  Surgeon: [name], MD;  Location: [facility] OR;  Service: Orthopedics;  Laterality: Left;   INCISION AND DRAINAGE OF WOUND N/A 01/24/2017   Procedure: IRRIGATION AND DEBRIDEMENT WOUND- BUTTOCK ABSCESS;  Surgeon: Almond Lint, MD;  Location: [facility] ORS;  Service: General;  Laterality: N/A;   [name] WITHOUT CARDIOVERSION N/A 09/09/2021   Procedure:  TRANSESOPHAGEAL ECHOCARDIOGRAM ([name]);  Surgeon: Quintella Reichert, MD;  Location: [facility] ENDOSCOPY;  Service: Cardiovascular;  Laterality: N/A;     A IV Location/Drains/Wounds Patient Lines/Drains/Airways Status     Active Line/Drains/Airways     Name Placement date Placement time Site Days   Peripheral IV 07/25/22 20 G Anterior;Left;Proximal Forearm 07/25/22  0830  Forearm  less than 1   Peripheral IV 07/25/22 20 G 1.88" Anterior;Right Forearm 07/25/22  1012  Forearm  less than 1   Negative Pressure Wound Therapy Hip Left 09/13/21  1213  --  315   Negative Pressure Wound  Therapy Hip Left;Anterior 10/30/21  1308  --  268   Suprapubic Catheter Latex 20 Fr. 11/01/21  1630  Latex  266   Incision (Closed) 01/24/17 Buttocks Other (Comment) 01/24/17  2029  -- 2008   Incision (Closed) 01/25/17 Buttocks Right 01/25/17  0230  -- 2007   Incision (Closed) 09/11/21 Hip Left 09/11/21  1453  -- 317   Incision (Closed) 09/13/21 Hip Left 09/13/21  1244  -- 315   Incision (Closed) 09/18/21 Hip Left 09/18/21  0937  -- 310   Incision (Closed) 10/30/21 Hip Left 10/30/21  1309  -- 268   Pressure Injury 01/25/17 Deep Tissue Injury - Purple or maroon localized area of discolored intact skin or blood-filled blister due to damage of underlying soft tissue from pressure and/or shear. dark patch of tissue with thin layer of superfacial intact 01/25/17  0230  -- 2007   Pressure Injury 01/28/17 Unstageable - Full thickness tissue loss in which the base of the ulcer is covered by slough (yellow, tan, gray, green or Bolinger) and/or eschar (tan, Kaufman or black) in the wound bed. HYDRO 01/28/17  1150  -- 2004   Pressure Injury 09/04/21 Ischial tuberosity Left Stage 4 - Full thickness tissue loss with exposed bone, tendon or muscle. tunneling to hardpoint 09/04/21  1330  -- 324   Pressure Injury 09/04/21 Buttocks Left Stage 3 -  Full thickness tissue loss. Subcutaneous fat may be visible but bone, tendon or muscle are NOT exposed. deep hole @ 1cm deep by 1cm diameter hole with red edges 09/04/21  1330  -- 324   Pressure Injury 10/31/21 Hip Left Unstageable - Full thickness tissue loss in which the base of the injury is covered by slough (yellow, tan, gray, green or Memmott) and/or eschar (tan, Bialas or black) in the wound bed. 10/31/21  2000  -- 267   Pressure Injury 10/31/21 Hip Left Unstageable - Full thickness tissue loss in which the base of the injury is covered by slough (yellow, tan, gray, green or Furia) and/or eschar (tan, Buice or black) in the wound bed. 10/31/21  2000  -- 267   Wound / Incision  (Open or Dehisced) 09/06/21 Foot Anterior;Right 09/06/21  0800  Foot  322   Wound / Incision (Open or Dehisced) 10/31/21 Other (Comment) Groin fistula 10/31/21  0406  Groin  267            Intake/Output Last 24 hours No intake or output data in the 24 hours ending 07/25/22 1345  Labs/Imaging Results for orders placed or performed during the hospital encounter of 07/25/22 (from the past 48 hour(s))  Resp panel by RT-PCR (RSV, Flu A&B, Covid) Anterior Nasal Swab     Status: None   Collection Time: 07/25/22  7:58 AM   Specimen: Anterior Nasal Swab  Result Value Ref Range   SARS Coronavirus 2 by RT PCR NEGATIVE NEGATIVE  Influenza A by PCR NEGATIVE NEGATIVE   Influenza B by PCR NEGATIVE NEGATIVE    Comment: (NOTE) The Xpert Xpress SARS-CoV-2/FLU/RSV plus assay is intended as an aid in the diagnosis of influenza from Nasopharyngeal swab specimens and should not be used as a sole basis for treatment. Nasal washings and aspirates are unacceptable for Xpert Xpress SARS-CoV-2/FLU/RSV testing.  Fact Sheet for Patients: BloggerCourse.com  Fact Sheet for Healthcare Providers: SeriousBroker.it  This test is not yet approved or cleared by the [place] FDA and has been authorized for detection and/or diagnosis of SARS-CoV-2 by FDA under an Emergency Use Authorization (EUA). This EUA will remain in effect (meaning this test can be used) for the duration of the COVID-19 declaration under Section 564(b)(1) of the Act, 21 U.S.C. section 360bbb-3(b)(1), unless the authorization is terminated or revoked.     Resp Syncytial Virus by PCR NEGATIVE NEGATIVE    Comment: (NOTE) Fact Sheet for Patients: BloggerCourse.com  Fact Sheet for Healthcare Providers: SeriousBroker.it  This test is not yet approved or cleared by the [place] FDA and has been authorized for detection and/or  diagnosis of SARS-CoV-2 by FDA under an Emergency Use Authorization (EUA). This EUA will remain in effect (meaning this test can be used) for the duration of the COVID-19 declaration under Section 564(b)(1) of the Act, 21 U.S.C. section 360bbb-3(b)(1), unless the authorization is terminated or revoked.  Performed at [facility], [address] [place], [place] [address]   Lactic acid, plasma     Status: Abnormal   Collection Time: 07/25/22  8:21 AM  Result Value Ref Range   Lactic Acid, Venous 2.1 (HH) 0.5 - 1.9 mmol/L    Comment: CRITICAL RESULT CALLED TO, READ BACK BY AND VERIFIED WITH A.SMITH,RN 0905 07/25/22 CLARK,S Performed at [facility], [address] [place], [place] [address]   Comprehensive metabolic panel     Status: Abnormal   Collection Time: 07/25/22  8:21 AM  Result Value Ref Range   Sodium 133 (L) 135 - 145 mmol/L   Potassium 2.7 (LL) 3.5 - 5.1 mmol/L    Comment: CRITICAL RESULT CALLED TO, READ BACK BY AND VERIFIED WITH A.SMITH,RN 0905 07/25/22 CLARK,S   Chloride 95 (L) 98 - 111 mmol/L   CO2 24 22 - 32 mmol/L   Glucose, Bld 121 (H) 70 - 99 mg/dL    Comment: Glucose reference range applies only to samples taken after fasting for at least 8 hours.   BUN 40 (H) 6 - 20 mg/dL   Creatinine, Ser 0.98 (H) 0.61 - 1.24 mg/dL   Calcium 7.9 (L) 8.9 - 10.3 mg/dL   Total Protein 6.8 6.5 - 8.1 g/dL   Albumin 1.6 (L) 3.5 - 5.0 g/dL   AST 60 (H) 15 - 41 U/L   ALT 43 0 - 44 U/L   Alkaline Phosphatase 274 (H) 38 - 126 U/L   Total Bilirubin 0.5 0.3 - 1.2 mg/dL   GFR, Estimated >11 >91 mL/min    Comment: (NOTE) Calculated using the CKD-EPI Creatinine Equation (2021)    Anion gap 14 5 - 15    Comment: Performed at [facility], [address], [place], [place] [address]  CBC with Differential     Status: Abnormal   Collection Time: 07/25/22  8:21 AM  Result Value Ref Range   WBC 16.7 (H) 4.0 - 10.5 K/uL   RBC 4.12 (L) 4.22 - 5.81 MIL/uL   Hemoglobin 8.3  (L) 13.0 -  17.0 g/dL    Comment: Reticulocyte Hemoglobin testing may be clinically indicated, consider ordering this additional test [id number]    HCT 28.2 (L) 39.0 - 52.0 %   MCV 68.4 (L) 80.0 - 100.0 fL   MCH 20.1 (L) 26.0 - 34.0 pg   MCHC 29.4 (L) 30.0 - 36.0 g/dL   RDW 52.8 (H) 41.3 - 24.4 %   Platelets 280 150 - 400 K/uL    Comment: REPEATED TO VERIFY   nRBC 0.0 0.0 - 0.2 %   Neutrophils Relative % 92 %   Neutro Abs 15.4 (H) 1.7 - 7.7 K/uL   Lymphocytes Relative 3 %   Lymphs Abs 0.5 (L) 0.7 - 4.0 K/uL   Monocytes Relative 4 %   Monocytes Absolute 0.7 0.1 - 1.0 K/uL   Eosinophils Relative 1 %   Eosinophils Absolute 0.2 0.0 - 0.5 K/uL   Basophils Relative 0 %   Basophils Absolute 0.0 0.0 - 0.1 K/uL   nRBC 1 (H) 0 /100 WBC   Abs Immature Granulocytes 0.00 0.00 - 0.07 K/uL   Polychromasia PRESENT     Comment: Performed at [facility], [address] [place], [place] [address]  Protime-INR     Status: Abnormal   Collection Time: 07/25/22  8:21 AM  Result Value Ref Range   Prothrombin Time 16.9 (H) 11.4 - 15.2 seconds   INR 1.4 (H) 0.8 - 1.2    Comment: (NOTE) INR goal varies based on device and disease states. Performed at [facility], [address] [place], [place] [address]   APTT     Status: None   Collection Time: 07/25/22  8:21 AM  Result Value Ref Range   aPTT 33 24 - 36 seconds    Comment: Performed at [facility], [address] [place], [place] [address]  Urinalysis, w/ Reflex to Culture (Infection Suspected) -Urine, Catheterized     Status: Abnormal   Collection Time: 07/25/22  8:21 AM  Result Value Ref Range   Specimen Source URINE, CATHETERIZED    Color, Urine YELLOW YELLOW   APPearance CLEAR CLEAR   Specific Gravity, Urine 1.010 1.005 - 1.030   pH 8.5 (H) 5.0 - 8.0   Glucose, UA NEGATIVE NEGATIVE mg/dL   Hgb urine dipstick SMALL (A) NEGATIVE   Bilirubin Urine NEGATIVE NEGATIVE   Ketones, ur NEGATIVE NEGATIVE mg/dL   Protein,  ur 30 (A) NEGATIVE mg/dL   Nitrite POSITIVE (A) NEGATIVE   Leukocytes,Ua LARGE (A) NEGATIVE   Squamous Epithelial / HPF 0-5 0 - 5 /HPF   WBC, UA 11-20 0 - 5 WBC/hpf    Comment: Reflex urine culture not performed if WBC <=10, OR if Squamous epithelial cells >5. If Squamous epithelial cells >5, suggest recollection.   RBC / HPF 0-5 0 - 5 RBC/hpf   Bacteria, UA FEW (A) NONE SEEN   Mucus PRESENT     Comment: Performed at [facility], [address] [place], [place] [address]  I-stat chem 8, ED (not at [facility], [facility] or [facility])     Status: Abnormal   Collection Time: 07/25/22  8:29 AM  Result Value Ref Range   Sodium 134 (L) 135 - 145 mmol/L   Potassium 2.5 (LL) 3.5 - 5.1 mmol/L   Chloride 98 98 - 111 mmol/L   BUN 34 (H) 6 - 20 mg/dL   Creatinine, Ser 5.63 (H) 0.61 - 1.24 mg/dL   Glucose, Bld 875 (H) 70 -  99 mg/dL    Comment: Glucose reference range applies only to samples taken after fasting for at least 8 hours.   Calcium, Ion 1.03 (L) 1.15 - 1.40 mmol/L   TCO2 25 22 - 32 mmol/L   Hemoglobin 9.2 (L) 13.0 - 17.0 g/dL   HCT 24.4 (L) 01.0 - 27.2 %   Comment NOTIFIED PHYSICIAN   I-stat chem 8, ed     Status: Abnormal   Collection Time: 07/25/22  8:33 AM  Result Value Ref Range   Sodium 134 (L) 135 - 145 mmol/L   Potassium 2.6 (LL) 3.5 - 5.1 mmol/L   Chloride 99 98 - 111 mmol/L   BUN 38 (H) 6 - 20 mg/dL   Creatinine, Ser 5.36 (H) 0.61 - 1.24 mg/dL   Glucose, Bld 644 (H) 70 - 99 mg/dL    Comment: Glucose reference range applies only to samples taken after fasting for at least 8 hours.   Calcium, Ion 1.02 (L) 1.15 - 1.40 mmol/L   TCO2 27 22 - 32 mmol/L   Hemoglobin 9.2 (L) 13.0 - 17.0 g/dL   HCT 03.4 (L) 74.2 - 59.5 %   Comment NOTIFIED PHYSICIAN   Lactic acid, plasma     Status: None   Collection Time: 07/25/22 12:49 PM  Result Value Ref Range   Lactic Acid, Venous 1.9 0.5 - 1.9 mmol/L    Comment: Performed at [facility], [address], [place], [place] [address]   CT  ABDOMEN PELVIS W CONTRAST  Result Date: 07/25/2022 CLINICAL DATA:  Sepsis.  History of decubitus ulcers. EXAM: CT ABDOMEN AND PELVIS WITH CONTRAST TECHNIQUE: Multidetector CT imaging of the abdomen and pelvis was performed using the standard protocol following bolus administration of intravenous contrast. RADIATION DOSE REDUCTION: This exam was performed according to the departmental dose-optimization program which includes automated exposure control, adjustment of the mA and/or kV according to patient size and/or use of iterative reconstruction technique. CONTRAST:  75mL OMNIPAQUE IOHEXOL 350 MG/ML SOLN COMPARISON:  09/09/2021 FINDINGS: Lower chest: No acute abnormality. Hepatobiliary: No focal liver abnormality is seen. No gallstones, gallbladder wall thickening, or biliary dilatation. Pancreas: Unremarkable. No pancreatic ductal dilatation or surrounding inflammatory changes. Spleen: Splenomegaly measuring 14.6 cm in cranial caudal dimension. No focal splenic lesion. Adrenals/Urinary Tract: Normal adrenal glands. Cyst within inferior pole of the left kidney measures 1.1 cm. Too small to characterize cyst within the posterolateral cortex of the right kidney measures 5 mm. These are both compatible with Bosniak class 1 and 2 cysts, respectively. No follow-up imaging recommended.No kidney stones or hydroureter. T1He urinary bladder is decompressed around a suprapubic catheter. Mild diffuse bladder wall thickening. Three large calcifications are again noted within the lumen of the bladder which measure up to 3.6 cm Stomach/Bowel: Normal stomach. Duodenal diverticulum noted. The second portion of the scratch the second and third portions of the duodenum do not completely cross the midline and most of the small bowel loops are located in the right hemiabdomen compatible with a malrotation variant. Cecum and normal appendix are identified in the right lower quadrant of the abdomen. No pathologic dilatation of the large  or small bowel loops to suggest an obstruction. No bowel wall thickening or inflammation. Vascular/Lymphatic: Normal appearance of the abdominal aorta. There is aortocaval lymph node measuring 1 cm, image 50/3. Formally this measured the same. Pre caval lymph node measures 1 cm, image 64/3. Previously 1.2 cm. Bilateral inguinal adenopathy is again seen and appears similar to the previous exam.  Reproductive: Calcifications noted within the mildly enlarged prostate gland. Other: No free fluid or fluid collections within the abdomen or pelvis. Fat containing right inguinal hernia. Musculoskeletal: Bilateral decubitus ulceration is identified overlying the posterior aspect of both hips and bilateral ischium. Severe hip dysplasia and advanced degenerative changes and bone destruction seen in the hips bilaterally. There is gas and fluid noted in the soft tissues surrounding the left hip as well as within the left hip joint. Progressive bony erosion similar appearance of the left acetabulum, image 87/3. New gas and fluid noted within the soft tissues surrounding the right hip as well as the right hip joint. Stable destructive changes within the right hip there is a large open wound containing gas and debris which extends up to the acetabulum where similar bony destruction of the acetabulum and right femoral head., image 87/3. Soft tissue ulceration, subcutaneous gas and soft tissue thickening along the left gluteal fold towards the base of penis. Signs chronic osteomyelitis with increased sclerosis and bony destruction within bilateral pubic rami. No drainable fluid collections identified within the pelvis. IMPRESSION: 1. Bilateral decubitus ulceration overlying the posterior aspect of both hips and bilateral ischium. There is gas and fluid noted in the soft tissues surrounding the hips as well as within the hip joints bilaterally. Findings are concerning for septic arthritis. 2. Soft tissue ulceration, subcutaneous gas  and soft tissue thickening along the left gluteal fold towards the base of penis. 3. Signs of chronic osteomyelitis with increased sclerosis and bony destruction within bilateral pubic rami. 4. Splenomegaly. 5. Three large calcifications within the lumen of the urinary bladder which measure up to 3.6 cm. Unchanged from previous exam. 6. Stable retroperitoneal and bilateral inguinal adenopathy. 7. Malrotation variant of the bowel. No evidence of bowel obstruction. 8. Fat containing right inguinal hernia. Electronically Signed   By: Signa Kell M.D.   On: 07/25/2022 09:55   DG Chest 1 View  Result Date: 07/25/2022 CLINICAL DATA:  Larey Seat from wheelchair. EXAM: CHEST  1 VIEW COMPARISON:  09/03/2021 FINDINGS: Stable cardiomediastinal contours. Lung volumes are low. No pleural fluid, interstitial edema or airspace disease. Visualized osseous structures are unremarkable. IMPRESSION: Low lung volumes. No acute cardiopulmonary disease. Electronically Signed   By: Signa Kell M.D.   On: 07/25/2022 08:46   DG Shoulder Right  Result Date: 07/25/2022 CLINICAL DATA:  Shoulder pain after fall.  Larey Seat out of wheelchair. EXAM: RIGHT SHOULDER - 2+ VIEW COMPARISON:  None Available. FINDINGS: Mildly decreased bone mineralization. Moderate acromioclavicular joint space narrowing and peripheral osteophytosis. No significant glenohumeral osteoarthritis. Mild distal lateral subacromial spurring. No acute fracture or dislocation. IMPRESSION: 1. Moderate acromioclavicular osteoarthritis. 2. Mild distal lateral subacromial spurring. Electronically Signed   By: Neita Garnet M.D.   On: 07/25/2022 08:45    Pending Labs Unresulted Labs (From admission, onward)     Start     Ordered   07/26/22 0500  Comprehensive metabolic panel  Tomorrow morning,   R        07/25/22 1343   07/26/22 0500  CBC  Tomorrow morning,   R        07/25/22 1343   07/25/22 0821  Urine Culture  Once,   R        07/25/22 0821   07/25/22 0804   Aerobic/Anaerobic Culture w Gram Stain (surgical/deep wound)  Once,   URGENT       Question:  Patient immune status  Answer:  Normal   07/25/22 0803   07/25/22  9629  Blood Culture (routine x 2)  (Septic presentation on arrival (screening labs, nursing and treatment orders for obvious sepsis))  BLOOD CULTURE X 2,   STAT      07/25/22 0758            Vitals/Pain Today's Vitals   07/25/22 1030 07/25/22 1100 07/25/22 1115 07/25/22 1200  BP: 111/72 117/63 112/65 120/75  Pulse: 98 97 97 100  Resp: 16 17 16  (!) 24  Temp:      TempSrc:      SpO2: 97% 95% 98% 98%  Weight:      Height:      PainSc:        Isolation Precautions No active isolations  Medications Medications  ceFEPIme (MAXIPIME) 2 g in sodium chloride 0.9 % 100 mL IVPB (has no administration in time range)  enoxaparin (LOVENOX) injection 40 mg (has no administration in time range)  polyethylene glycol (MIRALAX / GLYCOLAX) packet 17 g (has no administration in time range)  prochlorperazine (COMPAZINE) injection 10 mg (has no administration in time range)  lactated ringers bolus 1,000 mL (0 mLs Intravenous Stopped 07/25/22 1112)    And  lactated ringers bolus 1,000 mL (0 mLs Intravenous Stopped 07/25/22 1027)    And  lactated ringers bolus 500 mL (0 mLs Intravenous Stopped 07/25/22 1159)  ceFEPIme (MAXIPIME) 2 g in sodium chloride 0.9 % 100 mL IVPB (0 g Intravenous Stopped 07/25/22 0942)  metroNIDAZOLE (FLAGYL) IVPB 500 mg (0 mg Intravenous Stopped 07/25/22 1159)  morphine (PF) 4 MG/ML injection 4 mg (4 mg Intravenous Given 07/25/22 0851)  iohexol (OMNIPAQUE) 350 MG/ML injection 75 mL (75 mLs Intravenous Contrast Given 07/25/22 0925)  potassium chloride 10 mEq in 100 mL IVPB (0 mEq Intravenous Stopped 07/25/22 1144)  fentaNYL (SUBLIMAZE) injection 50 mcg (50 mcg Intravenous Given 07/25/22 1156)    Mobility walks     Focused Assessments Renal Assessment Handoff:      R Recommendations: See Admitting Provider Note  Report  given to:   Additional Notes:

## 2022-07-25 NOTE — Assessment & Plan Note (Addendum)
Patient remains tachycardic and WBC elevated to 16.7. S/p 2.5 L NS in ED. In the past, the patient had bacterial colonization with Enterococcus.  - Admit to FMTS, attending Dr. Deirdre Priest - Progressive, Vital signs per floor - Regular diet  - PT/OT to treat - VTE prophylaxis: Lovenox - Fluids: s/p 2.5 L bolus - Consulted ID and was advised to cover for Enterococcus and MRSA with Daptomycin. Discontinue Flagyl and start Daptomycin. IV Cefepime and Daptomycin (7/5-) - AM CBC & BMP - Fall precautions - Blood cultures x2 pending

## 2022-07-25 NOTE — ED Triage Notes (Signed)
Pt. Stated, I had fell at home due to weakness and just feeling bad. Ive had some type of infection either from my urine cause I had an abscess in my urethra. Ive had chills, ? Sinus infection. When I fell which was Wednesday, I hurt my right shoulder. I didn't really notice til later.

## 2022-07-25 NOTE — ED Notes (Signed)
2 additional attempts made for 2nd iv access, unsuccessful. Iv team order placed .

## 2022-07-25 NOTE — Consult Note (Signed)
Reason for Consult:Bilateral hip septic arthritis Referring Physician: Pearlean Brownie Time called: 1425 Time at bedside: 1428   Anthony [name] is an 52 y.o. male.  HPI: Rydin comes in after becoming hypotensive and falling. He came to the ED and was found to be septic and admitted. CT scan showed his chronic left hip septic arthritis and possibly new right hip septic arthritis and orthopedic surgery was consulted. He does not c/o hip pain but is mostly insensate.  Past Medical History:  Diagnosis Date   Anxiety    Chronic indwelling Foley catheter    Complication of anesthesia    woken up in surgery before   Dysrhythmia    Endocarditis of mitral valve    Hypertension    Osteomyelitis (HCC)    left hip   Paraplegia (HCC)    secondary to Spina Bifida   PVCs (premature ventricular contractions)    Septic shock (HCC)    due to osteomyelitis   Spina bifida    Wheelchair bound     Past Surgical History:  Procedure Laterality Date   APPLICATION OF WOUND VAC  09/11/2021   Procedure: APPLICATION OF WOUND VAC;  Surgeon: [name], MD;  Location: [facility] OR;  Service: Orthopedics;;   BACK SURGERY     BUBBLE STUDY  09/09/2021   Procedure: BUBBLE STUDY;  Surgeon: Quintella Reichert, MD;  Location: [facility] ENDOSCOPY;  Service: Cardiovascular;;   HIP SURGERY     I & D EXTREMITY Left 09/11/2021   Procedure: LEFT HIP DEBRIDEMENT AND REMOVAL FEMORAL HEAD;  Surgeon: [name], MD;  Location: [facility] OR;  Service: Orthopedics;  Laterality: Left;   I & D EXTREMITY Left 09/13/2021   Procedure: DEBRIDEMENT LEFT HIP;  Surgeon: [name], MD;  Location: [facility] OR;  Service: Orthopedics;  Laterality: Left;   I & D EXTREMITY Left 09/18/2021   Procedure: DEBRIDEMENT LEFT HIP, WOUND VAC EXCHANGE;  Surgeon: [name], MD;  Location: [facility] OR;  Service: Orthopedics;  Laterality: Left;   I & D EXTREMITY Left 10/30/2021   Procedure: LEFT HIP DEBRIDEMENT WITH KERECIS PLACEMENT;  Surgeon: [name], MD;   Location: [facility] OR;  Service: Orthopedics;  Laterality: Left;   INCISION AND DRAINAGE OF WOUND N/A 01/24/2017   Procedure: IRRIGATION AND DEBRIDEMENT WOUND- BUTTOCK ABSCESS;  Surgeon: Almond Lint, MD;  Location: [facility] ORS;  Service: General;  Laterality: N/A;   [name] WITHOUT CARDIOVERSION N/A 09/09/2021   Procedure: TRANSESOPHAGEAL ECHOCARDIOGRAM ([name]);  Surgeon: Quintella Reichert, MD;  Location: [facility] ENDOSCOPY;  Service: Cardiovascular;  Laterality: N/A;    Family History  Problem Relation Age of Onset   CAD Father        died age 11 from MI   Sudden Cardiac Death Sister        age 35   CAD Maternal Grandfather        died age 42 MI    Social History:  reports that he has quit smoking. His smoking use included cigarettes. He has never used smokeless tobacco. He reports that he does not drink alcohol. No history on file for drug use.  Allergies:  Allergies  Allergen Reactions   Firvanq [Vancomycin] Itching    Medications: I have reviewed the patient's current medications.  Results for orders placed or performed during the hospital encounter of 07/25/22 (from the past 48 hour(s))  Resp panel by RT-PCR (RSV, Flu A&B, Covid) Anterior Nasal Swab     Status: None   Collection  Time: 07/25/22  7:58 AM   Specimen: Anterior Nasal Swab  Result Value Ref Range   SARS Coronavirus 2 by RT PCR NEGATIVE NEGATIVE   Influenza A by PCR NEGATIVE NEGATIVE   Influenza B by PCR NEGATIVE NEGATIVE    Comment: (NOTE) The Xpert Xpress SARS-CoV-2/FLU/RSV plus assay is intended as an aid in the diagnosis of influenza from Nasopharyngeal swab specimens and should not be used as a sole basis for treatment. Nasal washings and aspirates are unacceptable for Xpert Xpress SARS-CoV-2/FLU/RSV testing.  Fact Sheet for Patients: BloggerCourse.com  Fact Sheet for Healthcare Providers: SeriousBroker.it  This test is not yet approved or cleared by the [place] FDA  and has been authorized for detection and/or diagnosis of SARS-CoV-2 by FDA under an Emergency Use Authorization (EUA). This EUA will remain in effect (meaning this test can be used) for the duration of the COVID-19 declaration under Section 564(b)(1) of the Act, 21 U.S.C. section 360bbb-3(b)(1), unless the authorization is terminated or revoked.     Resp Syncytial Virus by PCR NEGATIVE NEGATIVE    Comment: (NOTE) Fact Sheet for Patients: BloggerCourse.com  Fact Sheet for Healthcare Providers: SeriousBroker.it  This test is not yet approved or cleared by the [place] FDA and has been authorized for detection and/or diagnosis of SARS-CoV-2 by FDA under an Emergency Use Authorization (EUA). This EUA will remain in effect (meaning this test can be used) for the duration of the COVID-19 declaration under Section 564(b)(1) of the Act, 21 U.S.C. section 360bbb-3(b)(1), unless the authorization is terminated or revoked.  Performed at [facility], [address], [place], [place] [address]   Lactic acid, plasma     Status: Abnormal   Collection Time: 07/25/22  8:21 AM  Result Value Ref Range   Lactic Acid, Venous 2.1 (HH) 0.5 - 1.9 mmol/L    Comment: CRITICAL RESULT CALLED TO, READ BACK BY AND VERIFIED WITH A.SMITH,RN 0905 07/25/22 CLARK,S Performed at [facility], [address], [place], [place] [address]   Comprehensive metabolic panel     Status: Abnormal   Collection Time: 07/25/22  8:21 AM  Result Value Ref Range   Sodium 133 (L) 135 - 145 mmol/L   Potassium 2.7 (LL) 3.5 - 5.1 mmol/L    Comment: CRITICAL RESULT CALLED TO, READ BACK BY AND VERIFIED WITH A.SMITH,RN 0905 07/25/22 CLARK,S   Chloride 95 (L) 98 - 111 mmol/L   CO2 24 22 - 32 mmol/L   Glucose, Bld 121 (H) 70 - 99 mg/dL    Comment: Glucose reference range applies only to samples taken after fasting for at least 8 hours.   BUN 40 (H) 6 - 20 mg/dL    Creatinine, Ser 0.98 (H) 0.61 - 1.24 mg/dL   Calcium 7.9 (L) 8.9 - 10.3 mg/dL   Total Protein 6.8 6.5 - 8.1 g/dL   Albumin 1.6 (L) 3.5 - 5.0 g/dL   AST 60 (H) 15 - 41 U/L   ALT 43 0 - 44 U/L   Alkaline Phosphatase 274 (H) 38 - 126 U/L   Total Bilirubin 0.5 0.3 - 1.2 mg/dL   GFR, Estimated >11 >91 mL/min    Comment: (NOTE) Calculated using the CKD-EPI Creatinine Equation (2021)    Anion gap 14 5 - 15    Comment: Performed at [facility], [address] [place], [place] [address]  CBC with Differential     Status: Abnormal   Collection Time: 07/25/22  8:21 AM  Result Value Ref Range   WBC 16.7 (H) 4.0 - 10.5 K/uL   RBC 4.12 (L) 4.22 - 5.81 MIL/uL   Hemoglobin 8.3 (L) 13.0 - 17.0 g/dL    Comment: Reticulocyte Hemoglobin testing may be clinically indicated, consider ordering this additional test [id number]    HCT 28.2 (L) 39.0 - 52.0 %   MCV 68.4 (L) 80.0 - 100.0 fL   MCH 20.1 (L) 26.0 - 34.0 pg   MCHC 29.4 (L) 30.0 - 36.0 g/dL   RDW 78.4 (H) 69.6 - 29.5 %   Platelets 280 150 - 400 K/uL    Comment: REPEATED TO VERIFY   nRBC 0.0 0.0 - 0.2 %   Neutrophils Relative % 92 %   Neutro Abs 15.4 (H) 1.7 - 7.7 K/uL   Lymphocytes Relative 3 %   Lymphs Abs 0.5 (L) 0.7 - 4.0 K/uL   Monocytes Relative 4 %   Monocytes Absolute 0.7 0.1 - 1.0 K/uL   Eosinophils Relative 1 %   Eosinophils Absolute 0.2 0.0 - 0.5 K/uL   Basophils Relative 0 %   Basophils Absolute 0.0 0.0 - 0.1 K/uL   nRBC 1 (H) 0 /100 WBC   Abs Immature Granulocytes 0.00 0.00 - 0.07 K/uL   Polychromasia PRESENT     Comment: Performed at [facility], [address], [place], [place] [address]  Protime-INR     Status: Abnormal   Collection Time: 07/25/22  8:21 AM  Result Value Ref Range   Prothrombin Time 16.9 (H) 11.4 - 15.2 seconds   INR 1.4 (H) 0.8 - 1.2    Comment: (NOTE) INR goal varies based on device and disease states. Performed at [facility], [address], [place], [place] [address]    APTT     Status: None   Collection Time: 07/25/22  8:21 AM  Result Value Ref Range   aPTT 33 24 - 36 seconds    Comment: Performed at [facility], [address], [place], [place] [address]  Urinalysis, w/ Reflex to Culture (Infection Suspected) -Urine, Catheterized     Status: Abnormal   Collection Time: 07/25/22  8:21 AM  Result Value Ref Range   Specimen Source URINE, CATHETERIZED    Color, Urine YELLOW YELLOW   APPearance CLEAR CLEAR   Specific Gravity, Urine 1.010 1.005 - 1.030   pH 8.5 (H) 5.0 - 8.0   Glucose, UA NEGATIVE NEGATIVE mg/dL   Hgb urine dipstick SMALL (A) NEGATIVE   Bilirubin Urine NEGATIVE NEGATIVE   Ketones, ur NEGATIVE NEGATIVE mg/dL   Protein, ur 30 (A) NEGATIVE mg/dL   Nitrite POSITIVE (A) NEGATIVE   Leukocytes,Ua LARGE (A) NEGATIVE   Squamous Epithelial / HPF 0-5 0 - 5 /HPF   WBC, UA 11-20 0 - 5 WBC/hpf    Comment: Reflex urine culture not performed if WBC <=10, OR if Squamous epithelial cells >5. If Squamous epithelial cells >5, suggest recollection.   RBC / HPF 0-5 0 - 5 RBC/hpf   Bacteria, UA FEW (A) NONE SEEN   Mucus PRESENT     Comment: Performed at [facility], [address] [place], [place] [address]  I-stat chem 8, ED (not at [facility], [facility] or [facility])     Status: Abnormal   Collection Time: 07/25/22  8:29 AM  Result Value Ref Range   Sodium 134 (L) 135 - 145 mmol/L   Potassium 2.5 (LL) 3.5 - 5.1 mmol/L   Chloride 98 98 -  111 mmol/L   BUN 34 (H) 6 - 20 mg/dL   Creatinine, Ser 1.61 (H) 0.61 - 1.24 mg/dL   Glucose, Bld 096 (H) 70 - 99 mg/dL    Comment: Glucose reference range applies only to samples taken after fasting for at least 8 hours.   Calcium, Ion 1.03 (L) 1.15 - 1.40 mmol/L   TCO2 25 22 - 32 mmol/L   Hemoglobin 9.2 (L) 13.0 - 17.0 g/dL   HCT 04.5 (L) 40.9 - 81.1 %   Comment NOTIFIED PHYSICIAN   I-stat chem 8, ed     Status: Abnormal   Collection Time: 07/25/22  8:33 AM  Result Value Ref Range   Sodium 134 (L) 135 - 145  mmol/L   Potassium 2.6 (LL) 3.5 - 5.1 mmol/L   Chloride 99 98 - 111 mmol/L   BUN 38 (H) 6 - 20 mg/dL   Creatinine, Ser 9.14 (H) 0.61 - 1.24 mg/dL   Glucose, Bld 782 (H) 70 - 99 mg/dL    Comment: Glucose reference range applies only to samples taken after fasting for at least 8 hours.   Calcium, Ion 1.02 (L) 1.15 - 1.40 mmol/L   TCO2 27 22 - 32 mmol/L   Hemoglobin 9.2 (L) 13.0 - 17.0 g/dL   HCT 95.6 (L) 21.3 - 08.6 %   Comment NOTIFIED PHYSICIAN   Lactic acid, plasma     Status: None   Collection Time: 07/25/22 12:49 PM  Result Value Ref Range   Lactic Acid, Venous 1.9 0.5 - 1.9 mmol/L    Comment: Performed at [facility], [address], [place], [place] [address]    CT ABDOMEN PELVIS W CONTRAST  Result Date: 07/25/2022 CLINICAL DATA:  Sepsis.  History of decubitus ulcers. EXAM: CT ABDOMEN AND PELVIS WITH CONTRAST TECHNIQUE: Multidetector CT imaging of the abdomen and pelvis was performed using the standard protocol following bolus administration of intravenous contrast. RADIATION DOSE REDUCTION: This exam was performed according to the departmental dose-optimization program which includes automated exposure control, adjustment of the mA and/or kV according to patient size and/or use of iterative reconstruction technique. CONTRAST:  75mL OMNIPAQUE IOHEXOL 350 MG/ML SOLN COMPARISON:  09/09/2021 FINDINGS: Lower chest: No acute abnormality. Hepatobiliary: No focal liver abnormality is seen. No gallstones, gallbladder wall thickening, or biliary dilatation. Pancreas: Unremarkable. No pancreatic ductal dilatation or surrounding inflammatory changes. Spleen: Splenomegaly measuring 14.6 cm in cranial caudal dimension. No focal splenic lesion. Adrenals/Urinary Tract: Normal adrenal glands. Cyst within inferior pole of the left kidney measures 1.1 cm. Too small to characterize cyst within the posterolateral cortex of the right kidney measures 5 mm. These are both compatible with Bosniak class 1 and  2 cysts, respectively. No follow-up imaging recommended.No kidney stones or hydroureter. T1He urinary bladder is decompressed around a suprapubic catheter. Mild diffuse bladder wall thickening. Three large calcifications are again noted within the lumen of the bladder which measure up to 3.6 cm Stomach/Bowel: Normal stomach. Duodenal diverticulum noted. The second portion of the scratch the second and third portions of the duodenum do not completely cross the midline and most of the small bowel loops are located in the right hemiabdomen compatible with a malrotation variant. Cecum and normal appendix are identified in the right lower quadrant of the abdomen. No pathologic dilatation of the large or small bowel loops to suggest an obstruction. No bowel wall thickening or inflammation. Vascular/Lymphatic: Normal appearance of the abdominal aorta. There is aortocaval lymph node measuring 1 cm, image 50/3.  Formally this measured the same. Pre caval lymph node measures 1 cm, image 64/3. Previously 1.2 cm. Bilateral inguinal adenopathy is again seen and appears similar to the previous exam. Reproductive: Calcifications noted within the mildly enlarged prostate gland. Other: No free fluid or fluid collections within the abdomen or pelvis. Fat containing right inguinal hernia. Musculoskeletal: Bilateral decubitus ulceration is identified overlying the posterior aspect of both hips and bilateral ischium. Severe hip dysplasia and advanced degenerative changes and bone destruction seen in the hips bilaterally. There is gas and fluid noted in the soft tissues surrounding the left hip as well as within the left hip joint. Progressive bony erosion similar appearance of the left acetabulum, image 87/3. New gas and fluid noted within the soft tissues surrounding the right hip as well as the right hip joint. Stable destructive changes within the right hip there is a large open wound containing gas and debris which extends up to the  acetabulum where similar bony destruction of the acetabulum and right femoral head., image 87/3. Soft tissue ulceration, subcutaneous gas and soft tissue thickening along the left gluteal fold towards the base of penis. Signs chronic osteomyelitis with increased sclerosis and bony destruction within bilateral pubic rami. No drainable fluid collections identified within the pelvis. IMPRESSION: 1. Bilateral decubitus ulceration overlying the posterior aspect of both hips and bilateral ischium. There is gas and fluid noted in the soft tissues surrounding the hips as well as within the hip joints bilaterally. Findings are concerning for septic arthritis. 2. Soft tissue ulceration, subcutaneous gas and soft tissue thickening along the left gluteal fold towards the base of penis. 3. Signs of chronic osteomyelitis with increased sclerosis and bony destruction within bilateral pubic rami. 4. Splenomegaly. 5. Three large calcifications within the lumen of the urinary bladder which measure up to 3.6 cm. Unchanged from previous exam. 6. Stable retroperitoneal and bilateral inguinal adenopathy. 7. Malrotation variant of the bowel. No evidence of bowel obstruction. 8. Fat containing right inguinal hernia. Electronically Signed   By: Signa Kell M.D.   On: 07/25/2022 09:55   DG Chest 1 View  Result Date: 07/25/2022 CLINICAL DATA:  Larey Seat from wheelchair. EXAM: CHEST  1 VIEW COMPARISON:  09/03/2021 FINDINGS: Stable cardiomediastinal contours. Lung volumes are low. No pleural fluid, interstitial edema or airspace disease. Visualized osseous structures are unremarkable. IMPRESSION: Low lung volumes. No acute cardiopulmonary disease. Electronically Signed   By: Signa Kell M.D.   On: 07/25/2022 08:46   DG Shoulder Right  Result Date: 07/25/2022 CLINICAL DATA:  Shoulder pain after fall.  Larey Seat out of wheelchair. EXAM: RIGHT SHOULDER - 2+ VIEW COMPARISON:  None Available. FINDINGS: Mildly decreased bone mineralization.  Moderate acromioclavicular joint space narrowing and peripheral osteophytosis. No significant glenohumeral osteoarthritis. Mild distal lateral subacromial spurring. No acute fracture or dislocation. IMPRESSION: 1. Moderate acromioclavicular osteoarthritis. 2. Mild distal lateral subacromial spurring. Electronically Signed   By: Neita Garnet M.D.   On: 07/25/2022 08:45    Review of Systems  Constitutional:  Negative for chills, diaphoresis and fever.  HENT:  Negative for ear discharge, ear pain, hearing loss and tinnitus.   Eyes:  Negative for photophobia and pain.  Respiratory:  Negative for cough and shortness of breath.   Cardiovascular:  Negative for chest pain.  Gastrointestinal:  Negative for abdominal pain, nausea and vomiting.  Genitourinary:  Negative for dysuria, flank pain, frequency and urgency.  Musculoskeletal:  Positive for arthralgias (Occasional decub pain). Negative for back pain, myalgias and neck pain.  Neurological:  Negative for dizziness and headaches.  Hematological:  Does not bruise/bleed easily.  Psychiatric/Behavioral:  The patient is not nervous/anxious.    Blood pressure 120/75, pulse 100, temperature 98.4 F (36.9 C), temperature source Oral, resp. rate (!) 24, height 5\' 8"  (1.727 m), weight 79.4 kg, SpO2 98 %. Physical Exam Constitutional:      General: He is not in acute distress.    Appearance: He is well-developed. He is not diaphoretic.  HENT:     Head: Normocephalic and atraumatic.  Eyes:     General: No scleral icterus.       Right eye: No discharge.        Left eye: No discharge.     Conjunctiva/sclera: Conjunctivae normal.  Cardiovascular:     Rate and Rhythm: Normal rate and regular rhythm.  Pulmonary:     Effort: Pulmonary effort is normal. No respiratory distress.  Musculoskeletal:     Cervical back: Normal range of motion.     Comments: LLE No traumatic wounds, ecchymosis, or rash  Insensate  No knee or ankle effusion  Knee stable to  varus/ valgus and anterior/posterior stress  Sens DPN, SPN, TN absent  Motor EHL, ext, flex, evers 0/5  DP 2+, No significant edema  Skin:    General: Skin is warm and dry.  Neurological:     Mental Status: He is alert.  Psychiatric:        Mood and Affect: Mood normal.        Behavior: Behavior normal.     Assessment/Plan: Bilateral septic hips -- Would treat medically for now. If still here Monday please let me know; Dr. [name] may want to evaluate. No acute surgical need.    Freeman Caldron, PA-C Orthopedic Surgery [phone] 07/25/2022, 2:39 PM

## 2022-07-25 NOTE — Sepsis Progress Note (Signed)
Sepsis monitoring by eLink

## 2022-07-25 NOTE — Progress Notes (Signed)
   07/25/22 1637  Assess: MEWS Score  Temp (!) 102.4 F (39.1 C)  BP (!) 92/48  MAP (mmHg) (!) 61  Pulse Rate (!) 130  ECG Heart Rate (!) 130  Resp 18  Level of Consciousness Alert  SpO2 91 %  O2 Device Room Air  Assess: MEWS Score  MEWS Temp 2  MEWS Systolic 1  MEWS Pulse 3  MEWS RR 0  MEWS LOC 0  MEWS Score 6  MEWS Score Color Red  Assess: if the MEWS score is Yellow or Red  Were vital signs taken at a resting state? Yes  Focused Assessment No change from prior assessment  Does the patient meet 2 or more of the SIRS criteria? Yes  MEWS guidelines implemented  Yes, red  Treat  MEWS Interventions Considered administering scheduled or prn medications/treatments as ordered  Take Vital Signs  Increase Vital Sign Frequency  Red: Q1hr x2, continue Q4hrs until patient remains green for 12hrs  Escalate  MEWS: Escalate Red: Discuss with charge nurse and notify provider. Consider notifying RRT. If remains red for 2 hours consider need for higher level of care  Notify: Charge Nurse/RN  Name of Charge Nurse/RN Notified [place]  Provider Notification  Provider Name/Title Glendale Chard  Date Provider Notified 07/25/22  Time Provider Notified 1658  Method of Notification Page  Notification Reason Other (Comment) (Red mews)  Provider response En route  Date of Provider Response 07/25/22  Time of Provider Response 1659  Notify: Rapid Response  Name of Rapid Response RN Notified [place] (Simultaneous filing. User may not have seen previous data.)  Date Rapid Response Notified 07/25/22 (Simultaneous filing. User may not have seen previous data.)  Time Rapid Response Notified 1700 (Simultaneous filing. User may not have seen previous data.)  Assess: SIRS CRITERIA  SIRS Temperature  1  SIRS Pulse 1  SIRS Respirations  0  SIRS WBC 1  SIRS Score Sum  3

## 2022-07-25 NOTE — Progress Notes (Signed)
   07/25/22 1704  Assess: MEWS Score  BP (!) 80/59  MAP (mmHg) 67  Pulse Rate (!) 126  ECG Heart Rate (!) 124  Resp 20  Level of Consciousness Alert  SpO2 92 %  O2 Device Room Air  Assess: MEWS Score  MEWS Temp 2  MEWS Systolic 2  MEWS Pulse 2  MEWS RR 0  MEWS LOC 0  MEWS Score 6  MEWS Score Color Red  Assess: if the MEWS score is Yellow or Red  Were vital signs taken at a resting state? Yes  Focused Assessment No change from prior assessment  Does the patient meet 2 or more of the SIRS criteria? Yes  MEWS guidelines implemented  Yes, red  Treat  MEWS Interventions Considered administering scheduled or prn medications/treatments as ordered  Take Vital Signs  Increase Vital Sign Frequency  Red: Q1hr x2, continue Q4hrs until patient remains green for 12hrs  Escalate  MEWS: Escalate Red: Discuss with charge nurse and notify provider. Consider notifying RRT. If remains red for 2 hours consider need for higher level of care  Notify: Charge Nurse/RN  Name of Charge Nurse/RN Notified Clinical research associate  Provider response See new orders  Assess: SIRS CRITERIA  SIRS Temperature  1  SIRS Pulse 1  SIRS Respirations  0  SIRS WBC 1  SIRS Score Sum  3

## 2022-07-25 NOTE — Progress Notes (Signed)
Pharmacy Antibiotic Note  Anthony [name] is a 52 y.o. male admitted on 07/25/2022 with sepsis - sacral decubitus ulcer infection with concern for hip septic arthritis.  Pharmacy has been consulted for Cefepime and Daptomycin dosing. Per Family Medicine note - "Consulted ID and was advised to cover for Enterococcus and MRSA with Daptomycin. Discontinue Flagyl and start Daptomycin. IV Cefepime and Daptomycin."  Plan: Daptomycin 650mg  IV q24h Continue Cefepime 2 g every 12 hours F/u renal function  F/u weekly CK while on Daptomycin F/u cultures and de-escalate antibiotics as indicated  Height: 5\' 8"  (172.7 cm) Weight: 79.4 kg (175 lb) IBW/kg (Calculated) : 68.4  Temp (24hrs), Avg:99.8 F (37.7 C), Min:98.4 F (36.9 C), Max:101.1 F (38.4 C)  Recent Labs  Lab 07/25/22 0821 07/25/22 0829 07/25/22 0833 07/25/22 1249  WBC 16.7*  --   --   --   CREATININE 1.38* 1.40* 1.40*  --   LATICACIDVEN 2.1*  --   --  1.9     Estimated Creatinine Clearance: 59.7 mL/min (A) (by C-G formula based on SCr of 1.4 mg/dL (H)).    Allergies  Allergen Reactions   Firvanq [Vancomycin] Itching    Antimicrobials this admission: Cefepime 7/5 >> Daptomycin 7/5>>  Microbiology results: 7/5 BCx:  7/5 UCx:  Thank you for allowing pharmacy to be a part of this patient's care.  Christoper Fabian, PharmD, BCPS Please see amion for complete clinical pharmacist phone list 07/25/2022 3:41 PM

## 2022-07-25 NOTE — Assessment & Plan Note (Addendum)
Patient remains tachycardic and WBC elevated to 16.7. S/p 2.5 L NS in ED. In the past, the patient had bacterial colonization with Enterococcus. Blood pressures have been stable s/p fluid resuscitation.  - Admit to FMTS, attending Dr. Deirdre Priest - Progressive, Vital signs per floor - Regular diet  - PT/OT to treat - VTE prophylaxis: Lovenox - Fluids: s/p 2.5 L bolus - Consulted ID and was advised to cover for Enterococcus and MRSA with Daptomycin.. IV Cefepime, Flagyl, and Daptomycin (7/5-) - AM CBC & BMP - Fall precautions - Blood cultures x2 pending - Hold BP meds until pressures improve: lisinopril-hydrochlorothiazide 20-25 mg and Toprol 50 mg

## 2022-07-25 NOTE — ED Provider Notes (Signed)
[place] EMERGENCY DEPARTMENT AT [facility] Provider Note   CSN: [id number] Arrival date & time: 07/25/22  4098     History  Chief Complaint  Patient presents with   Fall   Weakness   Chills   Shoulder Injury   Shoulder Pain    Anthony [name] is a 52 y.o. male.   Fall  Weakness Shoulder Injury  Shoulder Pain  This patient is a very pleasant 52 year old male who suffers with complete paraplegia below the waist, he is followed closely by family doctors, wound care centers, he has congenital dislocations of the hip has had infectious problems that have left him hospitalized as recently as 11 months ago when he was admitted with sepsis, he has a suprapubic catheter in place chronically and has had multiple toes amputated secondary to both infections and trauma.  He lives with his significant other and presents to the hospital today with a complaint of weakness and a fall.  He reports that he fell about 5 days ago out of his wheelchair landing on his right shoulder, he did not get evaluated for this, over the course of the week he has had a subsequent symptoms of fevers, chills, sore throat, runny nose and postnasal drip and has had progressive weakness and decreased appetite.    Home Medications Prior to Admission medications   Medication Sig Start Date End Date Taking? Authorizing Provider  acetaminophen (TYLENOL) 500 MG tablet Take 2 tablets (1,000 mg total) by mouth 2 (two) times daily as needed for mild pain, headache or fever. 09/24/21   Lincoln Brigham, MD  amoxicillin (AMOXIL) 875 MG tablet Take 1 tablet (875 mg total) by mouth 2 (two) times daily. 02/27/22   Lockie Mola, MD  Catheters (FOLEY CATHETER 2-WAY) MISC Use as directed by urologist. 12/30/12   Leona Singleton, MD  doxycycline (VIBRA-TABS) 100 MG tablet Take 1 tablet (100 mg total) by mouth 2 (two) times daily. 01/30/22   [name], MD  flecainide (TAMBOCOR) 50 MG tablet Take 1 tablet (50 mg total)  by mouth 2 (two) times daily. 05/20/21   Camnitz, Andree Coss, MD  ibuprofen (ADVIL) 600 MG tablet Take 1 tablet (600 mg total) by mouth every 6 (six) hours as needed for fever. 09/24/21   Lincoln Brigham, MD  lisinopril-hydrochlorothiazide (ZESTORETIC) 20-25 MG tablet Take 1 tablet by mouth daily. 02/17/22   Sharlene Dory, PA-C  metoprolol succinate (TOPROL-XL) 50 MG 24 hr tablet Take 1 tablet (50 mg total) by mouth 2 (two) times daily. Take with or immediately following a meal. 01/10/22   Sharlene Dory, PA-C  Multiple Vitamin (MULTIVITAMIN WITH MINERALS) TABS tablet Take 1 tablet by mouth daily.    [provider]  nitroGLYCERIN (NITROSTAT) 0.4 MG SL tablet Place 1 tablet (0.4 mg total) under the tongue every 5 (five) minutes as needed for chest pain. 01/10/22   Sharlene Dory, PA-C  Omega-3 Fatty Acids (FISH OIL) 1000 MG CAPS Take 1 capsule by mouth 2 (two) times daily.    [provider]  Probiotic Product (PROBIOTIC DAILY PO) Take 1 tablet by mouth daily at 6 (six) AM.    [provider]  rosuvastatin (CRESTOR) 40 MG tablet Take 1 tablet by mouth once daily 05/01/22   Quintella Reichert, MD      Allergies    Vancomycin    Review of Systems   Review of Systems  Neurological:  Positive for weakness.  All other systems reviewed  and are negative.   Physical Exam Updated Vital Signs BP (!) 78/48   Pulse (!) 102   Temp 98.4 F (36.9 C) (Oral)   Resp 12   Ht 1.727 m (5\' 8" )   Wt 79.4 kg   SpO2 97%   BMI 26.61 kg/m  Physical Exam Vitals and nursing note reviewed.  Constitutional:      General: He is in acute distress.     Appearance: He is well-developed. He is ill-appearing.  HENT:     Head: Normocephalic and atraumatic.     Nose: No congestion or rhinorrhea.     Mouth/Throat:     Mouth: Mucous membranes are dry.     Pharynx: No oropharyngeal exudate.  Eyes:     General: No scleral icterus.       Right eye: No discharge.        Left eye: No discharge.      Conjunctiva/sclera: Conjunctivae normal.     Pupils: Pupils are equal, round, and reactive to light.  Neck:     Thyroid: No thyromegaly.     Vascular: No JVD.  Cardiovascular:     Rate and Rhythm: Regular rhythm. Tachycardia present.     Heart sounds: Normal heart sounds. No murmur heard.    No friction rub. No gallop.  Pulmonary:     Effort: Pulmonary effort is normal. No respiratory distress.     Breath sounds: Normal breath sounds. No wheezing or rales.  Abdominal:     General: Bowel sounds are normal. There is no distension.     Palpations: Abdomen is soft. There is no mass.     Tenderness: There is no abdominal tenderness.  Genitourinary:    Comments: Suprapubic catheter in place draining clear yellow urine Musculoskeletal:        General: No tenderness or signs of injury. Normal range of motion.     Cervical back: Normal range of motion and neck supple.     Right lower leg: Edema present.     Left lower leg: Edema present.     Comments: Bilateral upper extremities are normal, lower extremities with complete paralysis, multiple toes missing on the feet, skin is intact and though edematous there is no signs of erythema or open wounds  Lymphadenopathy:     Cervical: No cervical adenopathy.  Skin:    General: Skin is warm and dry.     Findings: No erythema or rash.     Comments: Multiple draining decubitus ulcers present in the left buttock and hip over ischial tuberosity, packing present, foul smell present  Neurological:     Mental Status: He is alert.     Coordination: Coordination normal.     Comments: Awake alert and able to follow commands with his arms, mild general weakness but no focality, cranial nerves III through XII intact  Psychiatric:        Behavior: Behavior normal.     ED Results / Procedures / Treatments   Labs (all labs ordered are listed, but only abnormal results are displayed) Labs Reviewed  LACTIC ACID, PLASMA - Abnormal; Notable for the following  components:      Result Value   Lactic Acid, Venous 2.1 (*)    All other components within normal limits  COMPREHENSIVE METABOLIC PANEL - Abnormal; Notable for the following components:   Sodium 133 (*)    Potassium 2.7 (*)    Chloride 95 (*)    Glucose, Bld 121 (*)    BUN  40 (*)    Creatinine, Ser 1.38 (*)    Calcium 7.9 (*)    Albumin 1.6 (*)    AST 60 (*)    Alkaline Phosphatase 274 (*)    All other components within normal limits  CBC WITH DIFFERENTIAL/PLATELET - Abnormal; Notable for the following components:   WBC 16.7 (*)    RBC 4.12 (*)    Hemoglobin 8.3 (*)    HCT 28.2 (*)    MCV 68.4 (*)    MCH 20.1 (*)    MCHC 29.4 (*)    RDW 18.0 (*)    Neutro Abs 15.4 (*)    Lymphs Abs 0.5 (*)    nRBC 1 (*)    All other components within normal limits  PROTIME-INR - Abnormal; Notable for the following components:   Prothrombin Time 16.9 (*)    INR 1.4 (*)    All other components within normal limits  I-STAT CHEM 8, ED - Abnormal; Notable for the following components:   Sodium 134 (*)    Potassium 2.5 (*)    BUN 34 (*)    Creatinine, Ser 1.40 (*)    Glucose, Bld 122 (*)    Calcium, Ion 1.03 (*)    Hemoglobin 9.2 (*)    HCT 27.0 (*)    All other components within normal limits  I-STAT CHEM 8, ED - Abnormal; Notable for the following components:   Sodium 134 (*)    Potassium 2.6 (*)    BUN 38 (*)    Creatinine, Ser 1.40 (*)    Glucose, Bld 121 (*)    Calcium, Ion 1.02 (*)    Hemoglobin 9.2 (*)    HCT 27.0 (*)    All other components within normal limits  RESP PANEL BY RT-PCR (RSV, FLU A&B, COVID)  RVPGX2  CULTURE, BLOOD (ROUTINE X 2)  CULTURE, BLOOD (ROUTINE X 2)  AEROBIC/ANAEROBIC CULTURE W GRAM STAIN (SURGICAL/DEEP WOUND)  APTT  LACTIC ACID, PLASMA  URINALYSIS, W/ REFLEX TO CULTURE (INFECTION SUSPECTED)    EKG EKG Interpretation Date/Time:  Friday July 25 2022 08:54:12 EDT Ventricular Rate:  104 PR Interval:  358 QRS Duration:  90 QT Interval:  337 QTC  Calculation: 444 R Axis:   -13  Text Interpretation: Sinus tachycardia Ventricular premature complex Prolonged PR interval Abnormal R-wave progression, early transition Inferior infarct, old Lateral leads are also involved Confirmed by Eber Hong ([id number]) on 07/25/2022 9:10:12 AM  Radiology DG Chest 1 View  Result Date: 07/25/2022 CLINICAL DATA:  Larey Seat from wheelchair. EXAM: CHEST  1 VIEW COMPARISON:  09/03/2021 FINDINGS: Stable cardiomediastinal contours. Lung volumes are low. No pleural fluid, interstitial edema or airspace disease. Visualized osseous structures are unremarkable. IMPRESSION: Low lung volumes. No acute cardiopulmonary disease. Electronically Signed   By: Signa Kell M.D.   On: 07/25/2022 08:46   DG Shoulder Right  Result Date: 07/25/2022 CLINICAL DATA:  Shoulder pain after fall.  Larey Seat out of wheelchair. EXAM: RIGHT SHOULDER - 2+ VIEW COMPARISON:  None Available. FINDINGS: Mildly decreased bone mineralization. Moderate acromioclavicular joint space narrowing and peripheral osteophytosis. No significant glenohumeral osteoarthritis. Mild distal lateral subacromial spurring. No acute fracture or dislocation. IMPRESSION: 1. Moderate acromioclavicular osteoarthritis. 2. Mild distal lateral subacromial spurring. Electronically Signed   By: Neita Garnet M.D.   On: 07/25/2022 08:45    Procedures .Critical Care  Performed by: Eber Hong, MD Authorized by: Eber Hong, MD   Critical care provider statement:    Critical care time (minutes):  45   Critical care time was exclusive of:  Separately billable procedures and treating other patients and teaching time   Critical care was necessary to treat or prevent imminent or life-threatening deterioration of the following conditions:  Sepsis, shock and renal failure   Critical care was time spent personally by me on the following activities:  Development of treatment plan with patient or surrogate, discussions with consultants,  evaluation of patient's response to treatment, examination of patient, obtaining history from patient or surrogate, review of old charts, re-evaluation of patient's condition, pulse oximetry, ordering and review of radiographic studies, ordering and review of laboratory studies and ordering and performing treatments and interventions   I assumed direction of critical care for this patient from another provider in my specialty: no     Care discussed with: admitting provider   Comments:           Medications Ordered in ED Medications  lactated ringers bolus 1,000 mL (has no administration in time range)    And  lactated ringers bolus 1,000 mL (1,000 mLs Intravenous New Bag/Given 07/25/22 0849)    And  lactated ringers bolus 500 mL (has no administration in time range)  metroNIDAZOLE (FLAGYL) IVPB 500 mg (has no administration in time range)  norepinephrine (LEVOPHED) 4mg  in (0.016 mg/mL) premix infusion (has no administration in time range)  ceFEPIme (MAXIPIME) 2 g in sodium chloride 0.9 % 100 mL IVPB (2 g Intravenous New Bag/Given 07/25/22 0852)  morphine (PF) 4 MG/ML injection 4 mg (4 mg Intravenous Given 07/25/22 1610)    ED Course/ Medical Decision Making/ A&P                             Medical Decision Making Amount and/or Complexity of Data Reviewed Labs: ordered. Radiology: ordered. ECG/medicine tests: ordered.  Risk Prescription drug management. Decision regarding hospitalization.    This patient presents to the ED for concern of increasing weakness and right shoulder pain, this involves an extensive number of treatment options, and is a complaint that carries with it a high risk of complications and morbidity.  The differential diagnosis includes potentially injuring the right shoulder or clavicle, more concern for the patient's tachycardia hypotension measured at 95 systolic with a heart rate of 960.  He has had fevers and chills this week.  He does not have any  respiratory symptoms at this time but has multiple potential sources of infection most concerning of which are his decubitus ulcers.  There is a foul smell in the room suggestive of an infected wound, will perform sepsis workup including fluid resuscitation for hypotension and tachycardia   Co morbidities that complicate the patient evaluation  Lower extremity paralysis, wheelchair-bound   Additional history obtained:  Additional history obtained from medical record External records from outside source obtained and reviewed including admission to the hospital in August 2023 for severe infection   Lab Tests:  I Ordered, and personally interpreted labs.  The pertinent results include: Lactic acid over 2, potassium of 2.6, creatinine is elevated inThe pattern of an acute kidney injury with a creatinine of 1.4, this is new compared to prior.  White blood cell count of 16,000   Imaging Studies ordered:  I ordered imaging studies including chest x-ray and shoulder x-ray I independently visualized and interpreted imaging which showed no infiltrates or fractures I agree with the radiologist interpretation   Cardiac Monitoring: / EKG:  The patient was maintained  on a cardiac monitor.  I personally viewed and interpreted the cardiac monitored which showed an underlying rhythm of: Sinus tachycardia   Consultations Obtained:  I requested consultation with the intensivist with ICU,  and discussed lab and imaging findings as well as pertinent plan - they recommend: They will send someone to admit the patient   Problem List / ED Course / Critical interventions / Medication management  The patient is in septic shock getting 30 cc/kg of lactated Ringer's, with tachycardia reported fevers, leukocytosis and progressive hypotension this patient was treated aggressively for septic shock and started on Levophed in the ER I ordered medication including Levophed, antibiotics, IV fluids for aggressive  resuscitation for septic shock Reevaluation of the patient after these medicines showed that the patient critically ill I have reviewed the patients home medicines and have made adjustments as needed   Social Determinants of Health:  Paraplegia   Test / Admission - Considered:  Admit to ICU         Final Clinical Impression(s) / ED Diagnoses Final diagnoses:  Septic shock (HCC)  Pressure injury of contiguous region involving left buttock and hip, unstageable (HCC)    Rx / DC Orders ED Discharge Orders     None         Eber Hong, MD 07/25/22 [phone]

## 2022-07-25 NOTE — Progress Notes (Signed)
   07/25/22 1455  Assess: MEWS Score  Temp (!) 101.1 F (38.4 C)  BP 125/70  MAP (mmHg) 86  Pulse Rate (!) 129  ECG Heart Rate (!) 129  Resp 20  Level of Consciousness Alert  SpO2 98 %  O2 Device Room Air  Assess: MEWS Score  MEWS Temp 1  MEWS Systolic 0  MEWS Pulse 2  MEWS RR 0  MEWS LOC 0  MEWS Score 3  MEWS Score Color Yellow  Assess: if the MEWS score is Yellow or Red  Were vital signs taken at a resting state? Yes  Focused Assessment No change from prior assessment  MEWS guidelines implemented  Yes, yellow  Treat  MEWS Interventions Considered administering scheduled or prn medications/treatments as ordered  Take Vital Signs  Increase Vital Sign Frequency  Yellow: Q2hr x1, continue Q4hrs until patient remains green for 12hrs  Escalate  MEWS: Escalate Yellow: Discuss with charge nurse and consider notifying provider and/or RRT  Notify: Charge Nurse/RN  Name of Charge Nurse/RN Notified [email or web address]  Assess: SIRS CRITERIA  SIRS Temperature  1  SIRS Pulse 1  SIRS Respirations  0  SIRS WBC 1  SIRS Score Sum  3

## 2022-07-25 NOTE — Consult Note (Signed)
WOC Nurse Consult Note: Reason for Consult: chronic wounds Wound type: Pressure; patient with history of SB/paraplegia. 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 History of septic arthritis and intervention per orthopedics (Dr. [name]) in the past. No current surgical needs per M. Jefferies PA this admission, may need follow up with Dr. [name] next week.  Pressure Injury POA: Yes Measurement: see nursing flow sheets Wound bed:one with images, mostly clean, however one noted with packing, last seen in [facility] in March 2024, clean, non granular. These wounds will not heal, chronic with epibole of wound edges. Wound be helpful to have images of wounds without packing of course. I will request from MD Drainage (amount, consistency, odor) heavy, Bonnie noted in images Periwound:intact  Dressing procedure/placement/frequency: Cleanse wounds with VASHE; dry Pack with 1/4% Dakins solution, patient uses at home for chronic wounds to control drainage and odor.  Low air loss mattress for moisture management and pressure redistribution  FU with WCC if desired at DC for problems with chronic wounds.  RD evaluation for wound nutritional supplementation.   Re consult if needed, will not follow at this time. Thanks  Vrishank Moster [employer], RN,CWOCN, CNS, CWON-AP [phone])

## 2022-07-25 NOTE — Hospital Course (Addendum)
Anthony [name] is a 52 y.o.male with paraplegia and a history of spina bifida with chronic decubitus ulcers, HTN, who was admitted to the [facility] Medicine Teaching Service at [facility] for sepsis secondary to chronic decubitus ulcers. His hospital course is detailed below:  Septic shock 2/2 chronic decubitus ulcers d/t spina bifida and paraplegia Patient reported feeling ill for 2 weeks with complaints of fever, weakness, chills, decreased appetite, nausea and emesis.  In the ED, was started on Levophed for hypotension.  Was weaned off of this in ED and did not require ICU stay.  Sepsis protocol was initiated.  CT abdomen pelvis was significant for bilateral septic hip joints and chronic osteomyelitis and bilateral pubic rami.  IV antibiotics started as listed below.  Patient continued to refever requiring antipyretics and orthopedic surgery consulted, who stated there is no surgical intervention at that time.  Urology also consulted as CT showed infection spreading to base of penis, recommended exchanging catheter and continuing patient on BSA with aggressive wound care.  IR consulted for consideration of draining, but feel there is no adequate pocket for drainage.  ID requested repeat [name] for endocarditis reevaluation, patient received [name] on 7/15, and this was negative for vegetation. General surgery was consulted and performed a diverting colostomy on 7/23.  Orthopedic surgery performed surgical debridement of bilateral hips on 7/31. Surgical cultures from hip debridement showed rare WBC, no organisms but rare Enterococcus Faecium (resistant to ampicillin and vancomycin), rare Candida species. ID recommended continuing daptomycin and meropenem and adding micafungin, which was later switched to fluconazole.  Patient refevered on day of discharge (8/12) and blood cultures were drawn again.  At discharge, WBC was 11.2 and patient feeling well.  Anti-infective Course: Blood cultures showed streptococcus dysgalactiae  and proteus vulgaris. ID was consulted and involved during hospitalization. Started on IV flagyl (7/5-7/8), PO flagyl (7/8-7/16), cefepime (7/5-7/15), and daptomycin (7/5-7/24)  Transitioned antibiotics to Zosyn (7/16-7/24) and Daptomycin (7/5-7/24) due to suspicion of drug fever from cefepime. Transitioned to PO antibiotics Doxycycline (7/25-7/28) Augmentin (7/25) and Ciprofloxacin (7/26-7/28). IV antibiotics restarted due to continued fevers to Daptomycin 600 mg (7/29-8/12) and Zosyn (7/29). Continued to fever on this regimen so was switched from Zosyn to Meropenem (7/29-8/6) due to hx of proteus and to cover for ESBL. Surgical cultures from hip with Candida species and E faecium resistant to ampicillin and vancomycin ID recommended continuing daptomycin and meropenem and adding Micafungin (8/4-8/8). Switched micafungin to fluconazole PO (8/8-9/12) after blood Cx grew susceptible species. Refevered 8/12 and blood cultures were drawn, Daptomycin increased to 850 mg from 600 mg (8/12-9/12).  Anemia During this admission, patient received total 5 units of RBCs, due to transfusion threshold <7.0, as patient's Hgb continually dropped, likely 2/2 to infection. Hemoglobin was 8.3 at discharge.  Right Shoulder Pain Due to weakness, patient reported that he had a fall onto his right shoulder prior to admission.  Right shoulder x-ray showed moderate AC joint osteoarthritis and mild distal lateral subacromial spurring. Continued undergoing PT while inpatient. Received steroid injection on 8/11 with subsequent improvement in pain and ROM.  Patient discharged to SNF.  Pain regimen: Tylenol 650 mg q6h PRN, Oxycodone 5 mg q4h PRN for moderate pain, Gabapentin 100 TID scheduled, Robaxin 1000 mg q8h scheduled.  R venous thromboembolus Right DVT noted via [place] and started patient on lovenox and was switched to Eliquis for 3 months (7/26-10/26).  Suprapubic catheter exchanged Prior to admission the patient had a  suprapubic catheter placed.  Catheter was exchanged  on 8/4 without difficulty and patient continued to produce urine well daily.  Other chronic conditions were medically managed with home medications and formulary alternatives as necessary:  PVCs (on Flecainide 50 mg BID) HTN (on Lisinopril-hydrochlorothiazide 20-25 mg, Toprol 50 mg - holding BP meds until pressures improve) HLD (on rosuvastatin 40 mg)  PCP Follow-up Recommendations: Follow up with ortho outpatient for tissue grafting in the office and regular wound cleaning. Repeat CBC and CMP at follow up. Blood cultures drawn on day of discharge and pending for review. Will need electric wheelchair orders with PCP. Finishing fluconazole & daptomycin 9/12, PICC line must be pulled at completion.

## 2022-07-25 NOTE — Consult Note (Signed)
NAME:  Anthony [name], MRN:  [id number], DOB:  11-Jan-1971, LOS: 0 ADMISSION DATE:  07/25/2022, CONSULTATION DATE: 07/25/2022 REFERRING MD: Emergency department physician, CHIEF COMPLAINT: Sepsis  History of Present Illness:  Anthony [name] is a 52 year old male who has spina bifida and is wheelchair-bound.  He has multiple sacral decubitus that extensive in nature and was last seen by physician 11 months previously.  The wounds are open but do not appear grossly infected.  He has been packing them himself and doing reasonably good job.  He presents with elevated white count, hypotension mild elevation in his lactic acid has now proven responsive to fluid resuscitation.  He has refused a diverting colostomy in the past.  He has extensive dental caries, suprapubic cath, and large open wounds of his sacrum.  He does not need intensive care unit at this point.  Will be available as needed.  Pertinent  Medical History   Past Medical History:  Diagnosis Date   Anxiety    Chronic indwelling Foley catheter    Complication of anesthesia    woken up in surgery before   Dysrhythmia    Endocarditis of mitral valve    Hypertension    Osteomyelitis (HCC)    left hip   Paraplegia (HCC)    secondary to Spina Bifida   PVCs (premature ventricular contractions)    Septic shock (HCC)    due to osteomyelitis   Spina bifida    Wheelchair bound      Significant Hospital Events: Including procedures, antibiotic start and stop dates in addition to other pertinent events     Interim History / Subjective:  Currently receiving fluid challenge for sepsis protocol  Objective   Blood pressure 104/62, pulse 96, temperature 98.4 F (36.9 C), temperature source Oral, resp. rate (!) 22, height 5\' 8"  (1.727 m), weight 79.4 kg, SpO2 97 %.       No intake or output data in the 24 hours ending 07/25/22 1052 Filed Weights   07/25/22 0746  Weight: 79.4 kg    Examination: General: 52 year old male who looks older  than stated age HENT: Extensive dental caries, no JVD or lymphadenopathy is appreciated Lungs: Clear to auscultation Cardiovascular: Sounds are regular Abdomen: Positive bowel sounds Extremities: Lower extremities were distorted, extensive sacral wounds with bone showing, complains of right shoulder pain Neuro: Paraplegic but otherwise intact GU: Suprapubic cath site looks unremarkable  Resolved Hospital Problem list     Assessment & Plan:  Hypotension in the setting of elevated white count, lactic acid 2.1 with multiple sources of infection which include multiple dental caries, massive sacral decubitus to left and right that are open, suprapubic catheter is changed monthly.  Pulmonary critical care was called due to to the fact he is refractory to fluid resuscitation.  On arrival and fluid resuscitation was ongoing.  His systolic blood pressure was 121.  He does not appear to be in ICU patient at this point but has a lot of potential to end up in the ICU.  He has longstanding spina bifida with open sacral decubitus last seen by physician 11 months ago. Aggressive antimicrobial therapy Fluid resuscitation he is losing a lot of fluid via his open wounds. Surgical evaluation He has declined a diverting colostomy in the past Wound ostomy care consult  History of suprapubic catheter which he changed himself on a monthly basis last changed July 1 Neurogenic bladder May need urological consult  Extensive sacral decubitus Consider CT scan Surgical consult Antimicrobial therapy  Wound ostomy care  Extensive dental caries Dental consult  Right shoulder pain with x-ray revealing spurs and arthritis  Best Practice (right click and "Reselect all SmartList Selections" daily)   Diet/type: Regular consistency (see orders) DVT prophylaxis: not indicated GI prophylaxis: PPI Lines: N/A Foley:  N/A Code Status:  full code Last date of multidisciplinary goals of care discussion [tbd] Mr. Covington  states he wants to be a full code at this time. Labs   CBC: Recent Labs  Lab 07/25/22 0821 07/25/22 0829 07/25/22 0833  WBC 16.7*  --   --   NEUTROABS 15.4*  --   --   HGB 8.3* 9.2* 9.2*  HCT 28.2* 27.0* 27.0*  MCV 68.4*  --   --   PLT 280  --   --     Basic Metabolic Panel: Recent Labs  Lab 07/25/22 0821 07/25/22 0829 07/25/22 0833  NA 133* 134* 134*  K 2.7* 2.5* 2.6*  CL 95* 98 99  CO2 24  --   --   GLUCOSE 121* 122* 121*  BUN 40* 34* 38*  CREATININE 1.38* 1.40* 1.40*  CALCIUM 7.9*  --   --    GFR: Estimated Creatinine Clearance: 59.7 mL/min (A) (by C-G formula based on SCr of 1.4 mg/dL (H)). Recent Labs  Lab 07/25/22 0821  WBC 16.7*  LATICACIDVEN 2.1*    Liver Function Tests: Recent Labs  Lab 07/25/22 0821  AST 60*  ALT 43  ALKPHOS 274*  BILITOT 0.5  PROT 6.8  ALBUMIN 1.6*   No results for input(s): "LIPASE", "AMYLASE" in the last 168 hours. No results for input(s): "AMMONIA" in the last 168 hours.  ABG    Component Value Date/Time   TCO2 27 07/25/2022 0833     Coagulation Profile: Recent Labs  Lab 07/25/22 0821  INR 1.4*    Cardiac Enzymes: No results for input(s): "CKTOTAL", "CKMB", "CKMBINDEX", "TROPONINI" in the last 168 hours.  HbA1C: No results found for: "HGBA1C"  CBG: No results for input(s): "GLUCAP" in the last 168 hours.  Review of Systems:   10 point review of system taken, please see HPI for positives and negatives.   Past Medical History:  He,  has a past medical history of Anxiety, Chronic indwelling Foley catheter, Complication of anesthesia, Dysrhythmia, Endocarditis of mitral valve, Hypertension, Osteomyelitis (HCC), Paraplegia (HCC), PVCs (premature ventricular contractions), Septic shock (HCC), Spina bifida, and Wheelchair bound.   Surgical History:   Past Surgical History:  Procedure Laterality Date   APPLICATION OF WOUND VAC  09/11/2021   Procedure: APPLICATION OF WOUND VAC;  Surgeon: [name], MD;   Location: [facility] OR;  Service: Orthopedics;;   BACK SURGERY     BUBBLE STUDY  09/09/2021   Procedure: BUBBLE STUDY;  Surgeon: Quintella Reichert, MD;  Location: [facility] ENDOSCOPY;  Service: Cardiovascular;;   HIP SURGERY     I & D EXTREMITY Left 09/11/2021   Procedure: LEFT HIP DEBRIDEMENT AND REMOVAL FEMORAL HEAD;  Surgeon: [name], MD;  Location: [facility] OR;  Service: Orthopedics;  Laterality: Left;   I & D EXTREMITY Left 09/13/2021   Procedure: DEBRIDEMENT LEFT HIP;  Surgeon: [name], MD;  Location: [facility] OR;  Service: Orthopedics;  Laterality: Left;   I & D EXTREMITY Left 09/18/2021   Procedure: DEBRIDEMENT LEFT HIP, WOUND VAC EXCHANGE;  Surgeon: [name], MD;  Location: [facility] OR;  Service: Orthopedics;  Laterality: Left;   I & D EXTREMITY Left 10/30/2021   Procedure:  LEFT HIP DEBRIDEMENT WITH KERECIS PLACEMENT;  Surgeon: [name], MD;  Location: [facility] OR;  Service: Orthopedics;  Laterality: Left;   INCISION AND DRAINAGE OF WOUND N/A 01/24/2017   Procedure: IRRIGATION AND DEBRIDEMENT WOUND- BUTTOCK ABSCESS;  Surgeon: Almond Lint, MD;  Location: [facility] ORS;  Service: General;  Laterality: N/A;   [name] WITHOUT CARDIOVERSION N/A 09/09/2021   Procedure: TRANSESOPHAGEAL ECHOCARDIOGRAM ([name]);  Surgeon: Quintella Reichert, MD;  Location: [facility] ENDOSCOPY;  Service: Cardiovascular;  Laterality: N/A;     Social History:   reports that he has quit smoking. His smoking use included cigarettes. He has never used smokeless tobacco. He reports that he does not drink alcohol.   Family History:  His family history includes CAD in his father and maternal grandfather; Sudden Cardiac Death in his sister.   Allergies Allergies  Allergen Reactions   Firvanq [Vancomycin] Itching     Home Medications  Prior to Admission medications   Medication Sig Start Date End Date Taking? Authorizing Provider  acetaminophen (TYLENOL) 500 MG tablet Take 1,000 mg by mouth daily as needed for moderate pain, headache or fever.   Yes  [provider]  flecainide (TAMBOCOR) 50 MG tablet Take 1 tablet (50 mg total) by mouth 2 (two) times daily. 05/20/21  Yes Camnitz, Andree Coss, MD  ibuprofen (ADVIL) 200 MG tablet Take 200 mg by mouth every 6 (six) hours as needed for headache or moderate pain.   Yes [provider]  lisinopril-hydrochlorothiazide (ZESTORETIC) 20-25 MG tablet Take 1 tablet by mouth daily. 02/17/22  Yes Asa Lente, Tessa N, PA-C  metoprolol succinate (TOPROL-XL) 50 MG 24 hr tablet Take 1 tablet (50 mg total) by mouth 2 (two) times daily. Take with or immediately following a meal. 01/10/22  Yes Conte, Tessa N, PA-C  Multiple Vitamin (MULTIVITAMIN WITH MINERALS) TABS tablet Take 1 tablet by mouth daily.   Yes [provider]  nitroGLYCERIN (NITROSTAT) 0.4 MG SL tablet Place 1 tablet (0.4 mg total) under the tongue every 5 (five) minutes as needed for chest pain. 01/10/22  Yes Conte, Tessa N, PA-C  Omega-3 Fatty Acids (FISH OIL PO) Take 1 capsule by mouth daily.   Yes [provider]  POTASSIUM CHLORIDE PO Take 1 tablet by mouth daily. OTC potassium supplement   Yes [provider]  rosuvastatin (CRESTOR) 40 MG tablet Take 1 tablet by mouth once daily Patient taking differently: Take 40 mg by mouth every evening. 05/01/22  Yes Quintella Reichert, MD  Catheters (FOLEY CATHETER 2-WAY) MISC Use as directed by urologist. 12/30/12   Leona Singleton, MD     Critical care time: 24 min    Brett Canales Aaryana Betke ACNP Acute Care Nurse Practitioner [facility] Pulmonary/Critical Care Please consult Amion 07/25/2022, 10:52 AM

## 2022-07-25 NOTE — Progress Notes (Signed)
PHARMACY - PHYSICIAN COMMUNICATION CRITICAL VALUE ALERT - BLOOD CULTURE IDENTIFICATION (BCID)  Anthony [name] is an 52 y.o. male who presented to [facility] on 07/25/2022 with a chief complaint of sepsi  Assessment:  Pt with GPC chains and GPR in blood. Awaiting BCID results.   Name of physician (or Provider) Contacted: Dr. Melissa Noon  Current antibiotics: Cefepime, Flagyl, Daptomycin  Changes to prescribed antibiotics recommended:  No additional abx needed at this time. Will f/u BCID. Noted that ID also following patient.  Results for orders placed or performed during the hospital encounter of 09/03/21  Blood Culture ID Panel (Reflexed) (Collected: 09/03/2021  4:18 PM)  Result Value Ref Range   Enterococcus faecalis NOT DETECTED NOT DETECTED   Enterococcus Faecium NOT DETECTED NOT DETECTED   Listeria monocytogenes NOT DETECTED NOT DETECTED   Staphylococcus species NOT DETECTED NOT DETECTED   Staphylococcus aureus (BCID) NOT DETECTED NOT DETECTED   Staphylococcus epidermidis NOT DETECTED NOT DETECTED   Staphylococcus lugdunensis NOT DETECTED NOT DETECTED   Streptococcus species DETECTED (A) NOT DETECTED   Streptococcus agalactiae NOT DETECTED NOT DETECTED   Streptococcus pneumoniae NOT DETECTED NOT DETECTED   Streptococcus pyogenes NOT DETECTED NOT DETECTED   A.calcoaceticus-baumannii NOT DETECTED NOT DETECTED   Bacteroides fragilis NOT DETECTED NOT DETECTED   Enterobacterales NOT DETECTED NOT DETECTED   Enterobacter cloacae complex NOT DETECTED NOT DETECTED   Escherichia coli NOT DETECTED NOT DETECTED   Klebsiella aerogenes NOT DETECTED NOT DETECTED   Klebsiella oxytoca NOT DETECTED NOT DETECTED   Klebsiella pneumoniae NOT DETECTED NOT DETECTED   Proteus species NOT DETECTED NOT DETECTED   Salmonella species NOT DETECTED NOT DETECTED   Serratia marcescens NOT DETECTED NOT DETECTED   Haemophilus influenzae NOT DETECTED NOT DETECTED   Neisseria meningitidis NOT DETECTED NOT DETECTED    Pseudomonas aeruginosa NOT DETECTED NOT DETECTED   Stenotrophomonas maltophilia NOT DETECTED NOT DETECTED   Candida albicans NOT DETECTED NOT DETECTED   Candida auris NOT DETECTED NOT DETECTED   Candida glabrata NOT DETECTED NOT DETECTED   Candida krusei NOT DETECTED NOT DETECTED   Candida parapsilosis NOT DETECTED NOT DETECTED   Candida tropicalis NOT DETECTED NOT DETECTED   Cryptococcus neoformans/gattii NOT DETECTED NOT DETECTED    Christoper Fabian, PharmD, BCPS Please see amion for complete clinical pharmacist phone list 07/25/2022  7:39 PM

## 2022-07-25 NOTE — H&P (Cosign Needed Addendum)
Hospital Admission History and Physical Service Pager: [phone]  Patient name: Anthony [name] Medical record number: [id number] Date of Birth: 1970/10/16 Age: 52 y.o. Gender: male  Primary Care Provider: Erick Alley, DO Consultants: none Code Status: Full Code  Preferred Emergency Contact: Contact Information     Name Relation Home Work Anthony [name] Mother [phone]          Chief Complaint: Sepsis  Assessment and Plan: [name] is a 52 y.o. male presenting with sepsis secondary to chronic decubitus ulcers. Differential for presentation of this includes osteomyelitis, UTI, pyelonephritis which can be ruled out due to patient not having a fever, UA was positive but may be a chronic colonization issue and patient denies dysuria or flank pain.  Patient was hypotensive, tachycardiac, with a leukocytosis of 16.7, patient meets sepsis criteria with the most likely source of infection being his chronic decubitus ulcers. Patient was treated with 2.5 L of NS in the ED and started on Cefepime and Flagyl with improvement of pressures. Of note, patient does have a history of mitral valve endocarditis in 08/2021.   Holding blood pressure medications until pressures stabilize. Will restart home statin and flecainide for PVCs.   Hospital Problem List      Hospital     * (Principal) Sepsis [facility])     Patient remains tachycardic and WBC elevated to 16.7. S/p 2.5 L NS in  ED. In the past, the patient had bacterial colonization with Enterococcus.  Blood pressures have been stable s/p fluid resuscitation.  - Admit to FMTS, attending Dr. Deirdre Priest - Progressive, Vital signs per floor - Regular diet  - PT/OT to treat - VTE prophylaxis: Lovenox - Fluids: s/p 2.5 L bolus - Consulted ID and was advised to cover for Enterococcus and MRSA with  Daptomycin.. IV Cefepime, Flagyl, and Daptomycin (7/5-) - AM CBC & BMP - Fall precautions - Blood cultures x2 pending - Hold BP meds  until pressures improve: lisinopril-hydrochlorothiazide  20-25 mg and Toprol 50 mg         Right shoulder pain     S/p fall last Wednesday/Thursday. Has tried OTC meds. XR showed no  signs of fracture or dislocation, only positive for AC osteoarthritis and  subacromial spurring.  - Oxycodone 5 mg for mild pain and 10 mg for breakthrough/severe pain        Decubitus ulcer due to spina bifida [facility])     Patient reports cleaning and packing his own wounds regularly. Multiple  wounds noted, but exam was limited due to pain, pictures were documented  in media tab. Ortho is planning to medically manage over the weekend and  reassess on Monday. - Consult wound care and ortho         Hypokalemia     Of note K+ 2.7, most likely 2/2 to vomiting and poor appetite over the  past 2 weeks.  - replete with 40 mEq IV K+ (7/5) - BMP recheck 5pm - AM BMP - Suspicious for malnutrition, consider RD consult        Asymptomatic bacteriuria     Patient reports no dysuria. UA positive for leukocytes and nitrites,  seems to be patient's normal urine. He has a foley catheter that he  replaces himself monthly. Last time it was changed was a week ago. - Urine culture pending      Chronic and Stable Medical Problems:  PVCs: Flecainide 50 mg BID  HTN: Lisinopril-hydrochlorothiazide 20-25 mg, Toprol 50  mg - holding BP meds until pressures improve HLD: rosuvastatin 40 mg   FEN/GI: NPO pending surgery recommendations VTE Prophylaxis: Lovenox   Disposition: Progressive, attending   History of Present Illness:  Anthony [name] is a 52 y.o. male presenting with R shoulder pain and feeling ill over the past two weeks. Patient reports getting really weak and falling on Wednesday/Thursday and hit his shoulder, but his pain escalated yesterday and is quite severe. None of his OTC medications helped. Currently, he describes having chills.  Of note, he reports decreased appetite - for the past week, started  having nausea and emesis for the past 2 weeks.   He denies having a fever or any recent exposure to sick contacts, chest pain, SOB, wheezing, abdominal pain. Reports having regular bowel movements. Has a suprapubic catheter in place that he changes himself at home. Last change was a week ago. Denies hematemesis and hematuria and melena.  In the ED, patient presented with tachycardia at 115, hypotensive and almost started on levophed. CCM was consulted and recommended continuing fluid resuscitation without starting pressures. His blood pressure improved after 2.5 L bolus. CT AP significant for bilateral septic hip joints and chronic osteomyelitis in bilateral pubic rami.   He was started on cefepime and flagyl in the ED and FMTS called for admission.    Review Of Systems: Per HPI with the following additions: as above  Pertinent Past Medical History: Spina Bifida HTN Osteomyelitis Endocarditis 08/2021 Remainder reviewed in history tab.   Pertinent Past Surgical History: I&D [name] w/o cardioversion Hip surgery Back surgery Remainder reviewed in history tab.   Pertinent Social History: Tobacco use:  no Alcohol use: denies Other Substance use: denies Lives by himself   Pertinent Family History: Father: Deceased, CAD Sister: Sudden cardiac death Remainder reviewed in history tab.   Important Outpatient Medications: Flecainide 50 mg BID Lisinopril-hydrochlorothiazide 20-25 mg daily Metoprolol succinate 50 mg BID Rosuvastatin 40 mg daily Foley Catheters  Remainder reviewed in medication history.   Objective: BP 125/70 (BP Location: Right Arm)   Pulse (!) 129   Temp (!) 101.1 F (38.4 C) (Oral)   Resp 20   Ht 5\' 8"  (1.727 m)   Wt 79.4 kg   SpO2 98%   BMI 26.61 kg/m   Exam: limited due to pain General: NAD. Awake and alert. Shaking d/t chills. Eyes: PEERL ENTM: poor dentition, missing teeth Cardiovascular: RRR. No M/R/G. Respiratory: CTAB. Normal WOB on  RA Gastrointestinal: TTP over RLQ and periumbilical region. Soft, nondistended MSK: athralgias present, sacral decubitus ulcers, insensate, no significant edema. 2 decubitus ulcers noted on L and R side with patient laying, limited due to pain Neuro: A&Ox3. No focal neurological deficits Psych: pleasant, responsive, cooperative  Labs:  CBC BMET  Recent Labs  Lab 07/25/22 0821 07/25/22 0829 07/25/22 0833  WBC 16.7*  --   --   HGB 8.3*   < > 9.2*  HCT 28.2*   < > 27.0*  PLT 280  --   --    < > = values in this interval not displayed.   Recent Labs  Lab 07/25/22 0821 07/25/22 0829 07/25/22 0833  NA 133*   < > 134*  K 2.7*   < > 2.6*  CL 95*   < > 99  CO2 24  --   --   BUN 40*   < > 38*  CREATININE 1.38*   < > 1.40*  GLUCOSE 121*   < > 121*  CALCIUM  7.9*  --   --    < > = values in this interval not displayed.    Pertinent additional labs  UA: positive for large leukocytes and positive nitrites, few bacteria present.   EKG: sinus tachycardia  Imaging Studies Performed: CT Abdomen/Pelvis w/ contrast 1. Bilateral decubitus ulceration overlying the posterior aspect of both hips and bilateral ischium. There is gas and fluid noted in the soft tissues surrounding the hips as well as within the hip joints bilaterally. Findings are concerning for septic arthritis. 2. Soft tissue ulceration, subcutaneous gas and soft tissue thickening along the left gluteal fold towards the base of penis. 3. Signs of chronic osteomyelitis with increased sclerosis and bony destruction within bilateral pubic rami. 4. Splenomegaly. 5. Three large calcifications within the lumen of the urinary bladder which measure up to 3.6 cm. Unchanged from previous exam. 6. Stable retroperitoneal and bilateral inguinal adenopathy. 7. Malrotation variant of the bowel. No evidence of bowel obstruction. 8. Fat containing right inguinal hernia.  R shoulder XR 1. Moderate acromioclavicular osteoarthritis. 2.  Mild distal lateral subacromial spurring.  Chest XR Low lung volumes. No acute cardiopulmonary disease.  [name] DO 07/25/2022, 3:56 PM PGY-1, [facility]  FPTS Intern pager: [phone], text pages welcome Secure chat group [facility] Teaching Service    I have evaluated this patient along with Dr. Fatima Blank and reviewed the above note, making necessary revisions.  Dorothyann Gibbs, MD 07/25/2022, 7:03 PM PGY-3, [facility]

## 2022-07-26 ENCOUNTER — Encounter (HOSPITAL_COMMUNITY): Payer: Self-pay | Admitting: Family Medicine

## 2022-07-26 ENCOUNTER — Observation Stay (HOSPITAL_COMMUNITY): Payer: Medicare HMO

## 2022-07-26 DIAGNOSIS — Q057 Lumbar spina bifida without hydrocephalus: Secondary | ICD-10-CM | POA: Diagnosis not present

## 2022-07-26 DIAGNOSIS — I330 Acute and subacute infective endocarditis: Secondary | ICD-10-CM | POA: Insufficient documentation

## 2022-07-26 DIAGNOSIS — B999 Unspecified infectious disease: Secondary | ICD-10-CM | POA: Diagnosis present

## 2022-07-26 DIAGNOSIS — L8994 Pressure ulcer of unspecified site, stage 4: Secondary | ICD-10-CM | POA: Diagnosis not present

## 2022-07-26 DIAGNOSIS — M19011 Primary osteoarthritis, right shoulder: Secondary | ICD-10-CM | POA: Diagnosis not present

## 2022-07-26 DIAGNOSIS — A499 Bacterial infection, unspecified: Secondary | ICD-10-CM | POA: Diagnosis not present

## 2022-07-26 DIAGNOSIS — A419 Sepsis, unspecified organism: Secondary | ICD-10-CM | POA: Diagnosis not present

## 2022-07-26 DIAGNOSIS — R609 Edema, unspecified: Secondary | ICD-10-CM | POA: Diagnosis present

## 2022-07-26 DIAGNOSIS — M8669 Other chronic osteomyelitis, multiple sites: Secondary | ICD-10-CM | POA: Diagnosis not present

## 2022-07-26 DIAGNOSIS — M779 Enthesopathy, unspecified: Secondary | ICD-10-CM | POA: Diagnosis present

## 2022-07-26 DIAGNOSIS — Z1611 Resistance to penicillins: Secondary | ICD-10-CM | POA: Diagnosis present

## 2022-07-26 DIAGNOSIS — A408 Other streptococcal sepsis: Secondary | ICD-10-CM | POA: Diagnosis present

## 2022-07-26 DIAGNOSIS — R918 Other nonspecific abnormal finding of lung field: Secondary | ICD-10-CM | POA: Diagnosis not present

## 2022-07-26 DIAGNOSIS — Z419 Encounter for procedure for purposes other than remedying health state, unspecified: Secondary | ICD-10-CM | POA: Diagnosis not present

## 2022-07-26 DIAGNOSIS — N179 Acute kidney failure, unspecified: Secondary | ICD-10-CM | POA: Diagnosis present

## 2022-07-26 DIAGNOSIS — F419 Anxiety disorder, unspecified: Secondary | ICD-10-CM | POA: Diagnosis present

## 2022-07-26 DIAGNOSIS — K029 Dental caries, unspecified: Secondary | ICD-10-CM | POA: Diagnosis present

## 2022-07-26 DIAGNOSIS — Z452 Encounter for adjustment and management of vascular access device: Secondary | ICD-10-CM | POA: Diagnosis not present

## 2022-07-26 DIAGNOSIS — M7989 Other specified soft tissue disorders: Secondary | ICD-10-CM | POA: Diagnosis not present

## 2022-07-26 DIAGNOSIS — L988 Other specified disorders of the skin and subcutaneous tissue: Secondary | ICD-10-CM | POA: Diagnosis present

## 2022-07-26 DIAGNOSIS — L89324 Pressure ulcer of left buttock, stage 4: Secondary | ICD-10-CM | POA: Diagnosis not present

## 2022-07-26 DIAGNOSIS — M86659 Other chronic osteomyelitis, unspecified thigh: Secondary | ICD-10-CM | POA: Diagnosis not present

## 2022-07-26 DIAGNOSIS — M8638 Chronic multifocal osteomyelitis, other site: Secondary | ICD-10-CM | POA: Diagnosis not present

## 2022-07-26 DIAGNOSIS — M8668 Other chronic osteomyelitis, other site: Secondary | ICD-10-CM | POA: Diagnosis present

## 2022-07-26 DIAGNOSIS — L0231 Cutaneous abscess of buttock: Secondary | ICD-10-CM | POA: Diagnosis present

## 2022-07-26 DIAGNOSIS — R5081 Fever presenting with conditions classified elsewhere: Secondary | ICD-10-CM | POA: Diagnosis not present

## 2022-07-26 DIAGNOSIS — L8945 Pressure ulcer of contiguous site of back, buttock and hip, unstageable: Secondary | ICD-10-CM | POA: Diagnosis not present

## 2022-07-26 DIAGNOSIS — M0000 Staphylococcal arthritis, unspecified joint: Secondary | ICD-10-CM

## 2022-07-26 DIAGNOSIS — I82A11 Acute embolism and thrombosis of right axillary vein: Secondary | ICD-10-CM | POA: Diagnosis present

## 2022-07-26 DIAGNOSIS — B952 Enterococcus as the cause of diseases classified elsewhere: Secondary | ICD-10-CM | POA: Diagnosis not present

## 2022-07-26 DIAGNOSIS — G8221 Paraplegia, complete: Secondary | ICD-10-CM | POA: Diagnosis present

## 2022-07-26 DIAGNOSIS — N210 Calculus in bladder: Secondary | ICD-10-CM | POA: Diagnosis not present

## 2022-07-26 DIAGNOSIS — M009 Pyogenic arthritis, unspecified: Secondary | ICD-10-CM | POA: Diagnosis present

## 2022-07-26 DIAGNOSIS — Z515 Encounter for palliative care: Secondary | ICD-10-CM | POA: Diagnosis not present

## 2022-07-26 DIAGNOSIS — E876 Hypokalemia: Secondary | ICD-10-CM | POA: Diagnosis present

## 2022-07-26 DIAGNOSIS — M86652 Other chronic osteomyelitis, left thigh: Secondary | ICD-10-CM | POA: Diagnosis not present

## 2022-07-26 DIAGNOSIS — G8220 Paraplegia, unspecified: Secondary | ICD-10-CM | POA: Diagnosis not present

## 2022-07-26 DIAGNOSIS — R0989 Other specified symptoms and signs involving the circulatory and respiratory systems: Secondary | ICD-10-CM | POA: Diagnosis not present

## 2022-07-26 DIAGNOSIS — W050XXA Fall from non-moving wheelchair, initial encounter: Secondary | ICD-10-CM | POA: Diagnosis present

## 2022-07-26 DIAGNOSIS — L8944 Pressure ulcer of contiguous site of back, buttock and hip, stage 4: Secondary | ICD-10-CM | POA: Diagnosis present

## 2022-07-26 DIAGNOSIS — Z933 Colostomy status: Secondary | ICD-10-CM | POA: Diagnosis not present

## 2022-07-26 DIAGNOSIS — Z87891 Personal history of nicotine dependence: Secondary | ICD-10-CM | POA: Diagnosis not present

## 2022-07-26 DIAGNOSIS — S71002A Unspecified open wound, left hip, initial encounter: Secondary | ICD-10-CM | POA: Diagnosis not present

## 2022-07-26 DIAGNOSIS — I10 Essential (primary) hypertension: Secondary | ICD-10-CM | POA: Diagnosis present

## 2022-07-26 DIAGNOSIS — R059 Cough, unspecified: Secondary | ICD-10-CM | POA: Diagnosis present

## 2022-07-26 DIAGNOSIS — J90 Pleural effusion, not elsewhere classified: Secondary | ICD-10-CM | POA: Diagnosis not present

## 2022-07-26 DIAGNOSIS — M86159 Other acute osteomyelitis, unspecified femur: Secondary | ICD-10-CM | POA: Diagnosis not present

## 2022-07-26 DIAGNOSIS — L89314 Pressure ulcer of right buttock, stage 4: Secondary | ICD-10-CM | POA: Diagnosis not present

## 2022-07-26 DIAGNOSIS — Z7189 Other specified counseling: Secondary | ICD-10-CM | POA: Diagnosis not present

## 2022-07-26 DIAGNOSIS — L089 Local infection of the skin and subcutaneous tissue, unspecified: Secondary | ICD-10-CM | POA: Diagnosis not present

## 2022-07-26 DIAGNOSIS — M8618 Other acute osteomyelitis, other site: Secondary | ICD-10-CM

## 2022-07-26 DIAGNOSIS — R7881 Bacteremia: Secondary | ICD-10-CM

## 2022-07-26 DIAGNOSIS — L89154 Pressure ulcer of sacral region, stage 4: Secondary | ICD-10-CM | POA: Diagnosis not present

## 2022-07-26 DIAGNOSIS — L02416 Cutaneous abscess of left lower limb: Secondary | ICD-10-CM | POA: Diagnosis not present

## 2022-07-26 DIAGNOSIS — K922 Gastrointestinal hemorrhage, unspecified: Secondary | ICD-10-CM | POA: Diagnosis not present

## 2022-07-26 DIAGNOSIS — Z1152 Encounter for screening for COVID-19: Secondary | ICD-10-CM | POA: Diagnosis not present

## 2022-07-26 DIAGNOSIS — L89159 Pressure ulcer of sacral region, unspecified stage: Secondary | ICD-10-CM | POA: Diagnosis not present

## 2022-07-26 DIAGNOSIS — M8660 Other chronic osteomyelitis, unspecified site: Secondary | ICD-10-CM | POA: Diagnosis not present

## 2022-07-26 DIAGNOSIS — R630 Anorexia: Secondary | ICD-10-CM | POA: Diagnosis present

## 2022-07-26 DIAGNOSIS — L8990 Pressure ulcer of unspecified site, unspecified stage: Secondary | ICD-10-CM | POA: Diagnosis not present

## 2022-07-26 DIAGNOSIS — E785 Hyperlipidemia, unspecified: Secondary | ICD-10-CM | POA: Diagnosis present

## 2022-07-26 DIAGNOSIS — D649 Anemia, unspecified: Secondary | ICD-10-CM | POA: Diagnosis not present

## 2022-07-26 DIAGNOSIS — Q059 Spina bifida, unspecified: Secondary | ICD-10-CM | POA: Diagnosis not present

## 2022-07-26 DIAGNOSIS — D638 Anemia in other chronic diseases classified elsewhere: Secondary | ICD-10-CM | POA: Diagnosis present

## 2022-07-26 DIAGNOSIS — M00059 Staphylococcal arthritis, unspecified hip: Secondary | ICD-10-CM | POA: Diagnosis not present

## 2022-07-26 DIAGNOSIS — R509 Fever, unspecified: Secondary | ICD-10-CM | POA: Diagnosis not present

## 2022-07-26 DIAGNOSIS — M25511 Pain in right shoulder: Secondary | ICD-10-CM | POA: Diagnosis not present

## 2022-07-26 DIAGNOSIS — N200 Calculus of kidney: Secondary | ICD-10-CM | POA: Diagnosis not present

## 2022-07-26 DIAGNOSIS — Z8679 Personal history of other diseases of the circulatory system: Secondary | ICD-10-CM | POA: Diagnosis not present

## 2022-07-26 DIAGNOSIS — M868X8 Other osteomyelitis, other site: Secondary | ICD-10-CM | POA: Diagnosis not present

## 2022-07-26 DIAGNOSIS — I340 Nonrheumatic mitral (valve) insufficiency: Secondary | ICD-10-CM | POA: Diagnosis not present

## 2022-07-26 DIAGNOSIS — K668 Other specified disorders of peritoneum: Secondary | ICD-10-CM | POA: Diagnosis not present

## 2022-07-26 DIAGNOSIS — R0600 Dyspnea, unspecified: Secondary | ICD-10-CM | POA: Diagnosis not present

## 2022-07-26 DIAGNOSIS — R6521 Severe sepsis with septic shock: Secondary | ICD-10-CM | POA: Diagnosis present

## 2022-07-26 DIAGNOSIS — L02415 Cutaneous abscess of right lower limb: Secondary | ICD-10-CM | POA: Diagnosis not present

## 2022-07-26 DIAGNOSIS — R0682 Tachypnea, not elsewhere classified: Secondary | ICD-10-CM | POA: Diagnosis not present

## 2022-07-26 DIAGNOSIS — I493 Ventricular premature depolarization: Secondary | ICD-10-CM | POA: Diagnosis not present

## 2022-07-26 DIAGNOSIS — R1110 Vomiting, unspecified: Secondary | ICD-10-CM | POA: Diagnosis not present

## 2022-07-26 LAB — CBC
HCT: 20.7 % — ABNORMAL LOW (ref 39.0–52.0)
HCT: 25.2 % — ABNORMAL LOW (ref 39.0–52.0)
Hemoglobin: 5.9 g/dL — CL (ref 13.0–17.0)
Hemoglobin: 7.2 g/dL — ABNORMAL LOW (ref 13.0–17.0)
MCH: 19.5 pg — ABNORMAL LOW (ref 26.0–34.0)
MCH: 19.7 pg — ABNORMAL LOW (ref 26.0–34.0)
MCHC: 28.5 g/dL — ABNORMAL LOW (ref 30.0–36.0)
MCHC: 28.6 g/dL — ABNORMAL LOW (ref 30.0–36.0)
MCV: 68.3 fL — ABNORMAL LOW (ref 80.0–100.0)
MCV: 68.9 fL — ABNORMAL LOW (ref 80.0–100.0)
Platelets: 232 10*3/uL (ref 150–400)
Platelets: 257 10*3/uL (ref 150–400)
RBC: 3.03 MIL/uL — ABNORMAL LOW (ref 4.22–5.81)
RBC: 3.66 MIL/uL — ABNORMAL LOW (ref 4.22–5.81)
RDW: 18.1 % — ABNORMAL HIGH (ref 11.5–15.5)
RDW: 18.4 % — ABNORMAL HIGH (ref 11.5–15.5)
WBC: 9.2 10*3/uL (ref 4.0–10.5)
WBC: 10.3 10*3/uL (ref 4.0–10.5)
nRBC: 0 % (ref 0.0–0.2)
nRBC: 0.2 % (ref 0.0–0.2)

## 2022-07-26 LAB — COMPREHENSIVE METABOLIC PANEL
ALT: 33 U/L (ref 0–44)
AST: 53 U/L — ABNORMAL HIGH (ref 15–41)
Albumin: <1.5 g/dL — ABNORMAL LOW (ref 3.5–5.0)
Alkaline Phosphatase: 177 U/L — ABNORMAL HIGH (ref 38–126)
Anion gap: 10 (ref 5–15)
BUN: 20 mg/dL (ref 6–20)
CO2: 25 mmol/L (ref 22–32)
Calcium: 7.6 mg/dL — ABNORMAL LOW (ref 8.9–10.3)
Chloride: 101 mmol/L (ref 98–111)
Creatinine, Ser: 0.68 mg/dL (ref 0.61–1.24)
GFR, Estimated: >60 mL/min (ref >60)
Glucose, Bld: 93 mg/dL (ref 70–99)
Potassium: 3.7 mmol/L (ref 3.5–5.1)
Sodium: 136 mmol/L (ref 135–145)
Total Bilirubin: 1 mg/dL (ref 0.3–1.2)
Total Protein: 5.3 g/dL — ABNORMAL LOW (ref 6.5–8.1)

## 2022-07-26 LAB — ECHOCARDIOGRAM COMPLETE
AR max vel: 2.97 cm2
AV Area VTI: 3.05 cm2
AV Area mean vel: 2.92 cm2
AV Mean grad: 4 mmHg
AV Peak grad: 7.8 mmHg
Ao pk vel: 1.4 m/s
Area-P 1/2: 5.02 cm2
Est EF: 55
Height: 68 in
S' Lateral: 4 cm
Weight: 2800 oz

## 2022-07-26 LAB — CULTURE, BLOOD (ROUTINE X 2)

## 2022-07-26 LAB — CK: Total CK: 58 U/L (ref 49–397)

## 2022-07-26 LAB — HEMOGLOBIN AND HEMATOCRIT, BLOOD
HCT: 21 % — ABNORMAL LOW (ref 39.0–52.0)
Hemoglobin: 6.1 g/dL — CL (ref 13.0–17.0)

## 2022-07-26 MED ORDER — LACTATED RINGERS IV BOLUS
500.0000 mL | Freq: Once | INTRAVENOUS | Status: AC
  Administered 2022-07-26: 500 mL via INTRAVENOUS

## 2022-07-26 MED ORDER — SODIUM CHLORIDE 0.9% IV SOLUTION: Freq: Once | INTRAVENOUS | Status: DC

## 2022-07-26 MED ORDER — SODIUM CHLORIDE 0.9 % IV SOLN
2.0000 g | Freq: Three times a day (TID) | INTRAVENOUS | Status: DC
  Administered 2022-07-26 – 2022-08-05 (×29): 2 g via INTRAVENOUS
  Filled 2022-07-26 (×29): qty 12.5

## 2022-07-26 NOTE — Progress Notes (Signed)
FMTS Interim Progress Note  S: Hemoglobin 6.1, received message from RN, visited bedside to discuss with patient.  Discussed risks/benefits of transfusion and patient elected to proceed.  No concerns at this time.  O: BP (!) 106/54 (BP Location: Right Arm)   Pulse 99   Temp 98.5 F (36.9 C) (Oral)   Resp 20   Ht 5\' 8"  (1.727 m)   Wt 79.4 kg   SpO2 100%   BMI 26.61 kg/m    NAD, awake, alert, not in any respiratory distress.  BP cycled in the room, MAP greater than 70  A/P:  Anemia, unclear etiology, no obvious sign/symptom of GI bleed. ?Hemarthrosis Hopeful that transfusion will also help with his soft BP  Transfuse 1 unit pRBC after type and screen Follow-up post H&H  Consider aspiration of hip fluid in AM  Vonna Drafts, MD 07/26/2022, 10:37 PM PGY-2, [facility] Service pager [phone]

## 2022-07-26 NOTE — Assessment & Plan Note (Signed)
Hgb 6.3 on CBC this morning, patient preparing to receive blood. Has a history of requiring transfusions in the past. Evidence of  iron deficiency during past admission. No evidence of active blood loss at this time. Possible there's a component of malnutrition contributing given concomitant albumin <1.5 - transfuse 1 unit PRBC, post transfusion H&H - transfusion threshold <7 - repeat CBC daily

## 2022-07-26 NOTE — Progress Notes (Signed)
Patient has needed hourly vitals with interventions due to fever and tachycardia in the last 4 hours. MD informed to consider transferring to higher level of care due to clinical acuity.

## 2022-07-26 NOTE — Progress Notes (Addendum)
FMTS Brief Progress Note  S: To bedside with Dr. Royal Piedra, pt with MAP <60 after receiving 500cc bolus at shift change. Pt alert and oriented, denies concerns, denies CP/SOB, palpitations. Endorses some continued shoulder pain but stable.   O: BP (!) 110/50 (BP Location: Right Arm)   Pulse (!) 107   Temp 98.8 F (37.1 C) (Oral)   Resp 20   Ht 5\' 8"  (1.727 m)   Wt 79.4 kg   SpO2 98%   BMI 26.61 kg/m    Gen: NAD, awake and appropriately responsive, A&O x3 CV: tachycardic, regular rhythm Resp: CTAB normal WOB on supplemental O2   A/P:  Hypotension in setting of sepsis 2/2 decubitus wounds and osteo MAP <60 after multiple boluses and continuous fluids  Discussed w/ PCCM attending Dr. Everardo All, she evaluated the pt at bedside, at that time his BP improved (MAP>60) and he remained clinically stable.  No need for ICU transfer at this time.   Per Dr. Everardo All, continue boluses as needed, recheck hgb, and consider administering albumin if the patient has worsening BP. Appreciate her evaluation and recommendations, will reconsult as needed.  Recheck H&H  Continue current antibiotics, continue to monitor blood pressure  Remainder per day team progress note  - Orders reviewed. Labs for AM ordered, which was adjusted as needed.  - If condition changes, plan includes page primary team.   Vonna Drafts, MD 07/26/2022, 8:23 PM PGY-2, [facility] Night Resident  Please page [phone] with questions.

## 2022-07-26 NOTE — Progress Notes (Signed)
Inpatient Rehab Admissions Coordinator:   Per therapy recommendations, patient was screened for CIR candidacy by Lamount Bankson, MS, CCC-SLP. At this time, Pt. is not yet at a level to tolerate the intensity of CIR; however,   Pt. may have potential to progress to becoming a potential CIR candidate, so CIR admissions team will follow and monitor for progress and participation with therapies and place consult order if Pt. appears to be an appropriate candidate. Please contact me with any questions.   Ladislav Caselli, MS, CCC-SLP Rehab Admissions Coordinator  [phone] (celll) [phone] (office)  

## 2022-07-26 NOTE — Evaluation (Signed)
Physical Therapy Evaluation Patient Details Name: Anthony [name] MRN: [id number] DOB: [birth year] Today's Date: 07/26/2022  History of Present Illness  Pt is a 52 y.o. male who presented 07/25/22 with weakness and a fall. Pt admitted with severe sepsis secondary to sacral decubitus ulcer infection with concern for hip septic arthritis noted on CT. Imaging of R shoulder showed moderate acromioclavicular OA & mild distal lasteral subacromial spurring. PMH: complete paraplegia, multiple toe amputations, chronic sacral wounds, neurogenic bladder with suprapubic catheter, spina bifida, HTN   Clinical Impression  Pt presents with condition above and deficits mentioned below, see PT Problem List. PTA, he was living alone and mod I for lateral scoot transfer to/from his w/c and for w/c mobility. Pt has baseline bil lower extremity weakness (0 throughout with MMT), bil lower extremity sensation deficits, and a R hip flexion contracture. He is currently limited in functional mobility by his R shoulder pain. Due to the pain, he is unable to utilize his R UE to assist in compensating for the lack of strength in his lower extremities. This resulted in him needing total assist x2 for bed mobility and max-total assist for static sitting balance today. If his R shoulder pain could be better controlled he would likely progress quickly and well, likely back to being mod I. Thus, recommending intensive inpatient rehab, >3 hours/day. Will continue to follow acutely.         Assistance Recommended at Discharge Intermittent Supervision/Assistance  If plan is discharge home, recommend the following:  Can travel by private vehicle  Two people to help with walking and/or transfers;A lot of help with bathing/dressing/bathroom;Assistance with cooking/housework;Assist for transportation;Help with stairs or ramp for entrance        Equipment Recommendations Hospital bed  Recommendations for Other Services  Rehab consult     Functional Status Assessment Patient has had a recent decline in their functional status and demonstrates the ability to make significant improvements in function in a reasonable and predictable amount of time.     Precautions / Restrictions Precautions Precautions: Fall;Other (comment) Precaution Comments: paraplegic Restrictions Weight Bearing Restrictions: No      Mobility  Bed Mobility Overal bed mobility: Needs Assistance Bed Mobility: Supine to Sit, Sit to Supine     Supine to sit: Total assist, +2 for physical assistance, +2 for safety/equipment, HOB elevated Sit to supine: Total assist, +2 for physical assistance, +2 for safety/equipment, HOB elevated   General bed mobility comments: Pt holding onto therapist's hand with his L to try to pull himself up to sit L EOB but ended up transitioning his L hand to support his R arm due to pain, total assist x2 to ascend trunk and manage legs off L EOB. Total assist x2 to manage trunk and legs back to supine with pt supporting his R UE with his L for pain management.    Transfers                   General transfer comment: deferred due to pain    Ambulation/Gait               General Gait Details: unable at baseline  Stairs            Wheelchair Mobility     Tilt Bed    Modified Rankin (Stroke Patients Only)       Balance Overall balance assessment: Needs assistance Sitting-balance support: Single extremity supported, Feet supported, Bilateral upper extremity supported Sitting balance-Leahy Scale: Poor Sitting  balance - Comments: Pt leaning posteriorly and to the L, limited by R shoulder pain, needing max-total assist for balance. Pt resting R elbow on folded pillow with moments of improved relaxation and pain. Postural control: Posterior lean, Left lateral lean     Standing balance comment: unable at baseline                             Pertinent Vitals/Pain Pain Assessment Pain  Assessment: Faces Faces Pain Scale: Hurts whole lot Pain Location: R shoulder Pain Descriptors / Indicators: Discomfort, Grimacing, Guarding, Moaning Pain Intervention(s): Limited activity within patient's tolerance, Monitored during session, Repositioned, Premedicated before session    Home Living Family/patient expects to be discharged to:: Private residence Living Arrangements: Alone Available Help at Discharge: Family;Available PRN/intermittently Type of Home: Apartment Home Access: Ramped entrance       Home Layout: One level Home Equipment: Wheelchair - manual;Grab bars - tub/shower;Transport chair;Other (comment) (sliding board) Additional Comments: info carried over from entry 09/19/21    Prior Function Prior Level of Function : Independent/Modified Independent             Mobility Comments: able to perform transfers in/out of his WC without difficulty. Just bought a sliding board but has not used it yet ADLs Comments: sits in the tub for bathing, able to complete ADLs w/o assist per pt. Pt able to detect when he needs to have BM and does so on bed pad and cleans himself. Recently obtained transportation services through insurance     Hand Dominance   Dominant Hand: Right    Extremity/Trunk Assessment   Upper Extremity Assessment Upper Extremity Assessment: Defer to OT evaluation    Lower Extremity Assessment Lower Extremity Assessment: RLE deficits/detail;LLE deficits/detail RLE Deficits / Details: no muscle activation; R hip contracted into ~30 degrees flexion, WFL PROM at knee; mild sensation to touch at superior thigh but absent sensation inferior to that bil; multiple toes amputated RLE Sensation: decreased light touch LLE Deficits / Details: no muscle activation; mild sensation to touch at superior thigh but absent sensation inferior to that bil; WFL PROM; multiple toes amputated LLE Sensation: decreased light touch    Cervical / Trunk Assessment Cervical  / Trunk Assessment: Other exceptions Cervical / Trunk Exceptions: spina bifida  Communication   Communication: No difficulties  Cognition Arousal/Alertness: Awake/alert Behavior During Therapy: WFL for tasks assessed/performed Overall Cognitive Status: Within Functional Limits for tasks assessed                                          General Comments General comments (skin integrity, edema, etc.): VSS on RA; encouraged use of ice to manage pain and inflammation of shoulder but reports it makes him too cold; discussed coordinating with ortho in regards to R shoulder pain and coordinating with RN in regards to muscle relaxers to manage pain    Exercises     Assessment/Plan    PT Assessment Patient needs continued PT services  PT Problem List Decreased strength;Decreased range of motion;Decreased activity tolerance;Decreased balance;Decreased mobility;Impaired sensation;Pain       PT Treatment Interventions DME instruction;Functional mobility training;Therapeutic activities;Therapeutic exercise;Balance training;Neuromuscular re-education;Patient/family education;Wheelchair mobility training    PT Goals (Current goals can be found in the Care Plan section)  Acute Rehab PT Goals Patient Stated Goal: to improve and go home PT Goal  Formulation: With patient Time For Goal Achievement: 08/09/22 Potential to Achieve Goals: Good    Frequency Min 1X/week     Co-evaluation PT/OT/SLP Co-Evaluation/Treatment: Yes Reason for Co-Treatment: For patient/therapist safety;To address functional/ADL transfers PT goals addressed during session: Mobility/safety with mobility;Balance         AM-PAC PT "6 Clicks" Mobility  Outcome Measure Help needed turning from your back to your side while in a flat bed without using bedrails?: Total Help needed moving from lying on your back to sitting on the side of a flat bed without using bedrails?: Total Help needed moving to and from a  bed to a chair (including a wheelchair)?: Total Help needed standing up from a chair using your arms (e.g., wheelchair or bedside chair)?: Total Help needed to walk in hospital room?: Total Help needed climbing 3-5 steps with a railing? : Total 6 Click Score: 6    End of Session   Activity Tolerance: Patient limited by pain Patient left: in bed;with call bell/phone within reach Nurse Communication: Other (comment) (OT coordinated muscle relaxers with RN) PT Visit Diagnosis: Muscle weakness (generalized) (M62.81);History of falling (Z91.81);Pain Pain - Right/Left: Right Pain - part of body: Shoulder    Time: 1610-9604 PT Time Calculation (min) (ACUTE ONLY): 31 min   Charges:   PT Evaluation $PT Eval Moderate Complexity: 1 Mod   PT General Charges $$ ACUTE PT VISIT: 1 Visit         Raymond Gurney, PT, DPT Acute Rehabilitation Services  Office: [phone]   Jewel Baize 07/26/2022, 11:50 AM

## 2022-07-26 NOTE — Assessment & Plan Note (Signed)
XR negative for fracture. - Oxycodone 5 mg for mild pain and 10 mg for breakthrough/severe pain - PT/OT - consider muscle relaxer if needed

## 2022-07-26 NOTE — Assessment & Plan Note (Signed)
Blood pressures remain with MAPs in the low 60s, getting intermittent boluses for BP support. Also getting blood today which should help. Had a shaking/shivering event last night that was self limited. Echo yesterday without evidence of endocarditis. He did have a jump in his white count today from 10>16, uncertain what the significance of this might be, if any.  Afebrile for the past 12 hours or so. - Daptomycin (7/5 - ) - IV Cefepime (7/5 - ) - Flagyl (7/5 - ) - Appreciate ID's assistance in antibiotic management   - BCID: strep spp + proteus, awaiting culture  - Hold home BP meds - Ortho to reassess tomorrow, likely not a great candidate for operative intervention. If he declines ortho operation, could consider reaching out to Gen surg or Urology

## 2022-07-26 NOTE — Assessment & Plan Note (Signed)
Patient cleans and packs his own wounds regularly. -Ortho to medically manage over the weekend and reassess on Monday. - wound care consulted, appreciate recs

## 2022-07-26 NOTE — Assessment & Plan Note (Signed)
Hgb 5.9 on CBC this morning, recheck was 7.2 and above transfusion threshold. Hgb baseline appears to be around 8.5. No active signs of bleeding. - transfusion threshold <7 - repeat CBC daily

## 2022-07-26 NOTE — Progress Notes (Signed)
*  PRELIMINARY RESULTS* Echocardiogram 2D Echocardiogram has been performed.  Anthony [name] 07/26/2022, 10:56 AM

## 2022-07-26 NOTE — Progress Notes (Signed)
Date and time results received: 07/26/22 2216  Test: HGB Critical Value: 6.1  Name of Provider Notified: Micheline Chapman MD  MD coming to bedside to speak to the patient about transfusion.

## 2022-07-26 NOTE — Progress Notes (Addendum)
FMTS Interim Progress Note  S: Called by nurse due to Red MEWS. Report stated patient was febrile 103.1 and tachycardic in 140s. Patient is seen lying in bed with mom present at bedside. Patient is quiet and without much to say, but his mother shares that he was doing well earlier and then over the last few hours began to feel worse.  O: BP (!) 104/55 (BP Location: Right Arm)   Pulse (!) 135   Temp (!) 103 F (39.4 C)   Resp (!) 31   Ht 5\' 8"  (1.727 m)   Wt 79.4 kg   SpO2 93%   BMI 26.61 kg/m   General: Ill-appearing, lying in bed with some shivering Pulm: Tachypneic on exam, without signs of respiratory distress Neuro: alert and oriented   A/P: Patient is tachycardic and febrile with a stable BP at this time. We presume this is still related to his chronic wounds and infections, for which he is on broad spectrum Abx. He has been on this course of Abx for just under 24 hours - do not consider this a failure of Abx treatment at this time. Will give IV fluid bolus, continue tylenol. Repeat blood cultures.  ADDENDUM: Called Pharmacy and due to improving creatinine increased cefepime to Q8H.  Cyndia Skeeters, DO 07/26/2022, 5:28 PM PGY-1, [facility] pager [phone]

## 2022-07-26 NOTE — Progress Notes (Signed)
Subjective: No new complaints   Antibiotics:  Anti-infectives (From admission, onward)    Start     Dose/Rate Route Frequency Ordered Stop   07/25/22 2100  ceFEPIme (MAXIPIME) 2 g in sodium chloride 0.9 % 100 mL IVPB        2 g 200 mL/hr over 30 Minutes Intravenous Every 12 hours 07/25/22 0926     07/25/22 2100  metroNIDAZOLE (FLAGYL) IVPB 500 mg        500 mg 100 mL/hr over 60 Minutes Intravenous Every 12 hours 07/25/22 1900     07/25/22 1700  DAPTOmycin (CUBICIN) 650 mg in sodium chloride 0.9 % IVPB        8 mg/kg  79.4 kg 126 mL/hr over 30 Minutes Intravenous Daily 07/25/22 1546     07/25/22 0800  ceFEPIme (MAXIPIME) 2 g in sodium chloride 0.9 % 100 mL IVPB        2 g 200 mL/hr over 30 Minutes Intravenous  Once 07/25/22 0758 07/25/22 0942   07/25/22 0800  metroNIDAZOLE (FLAGYL) IVPB 500 mg        500 mg 100 mL/hr over 60 Minutes Intravenous  Once 07/25/22 0758 07/25/22 1159       Medications: Scheduled Meds:  acetaminophen  500 mg Oral Q6H   enoxaparin (LOVENOX) injection  40 mg Subcutaneous Q24H   flecainide  50 mg Oral BID   lidocaine  1-2 patch Transdermal Q24H   sodium hypochlorite   Irrigation Daily   Continuous Infusions:  ceFEPime (MAXIPIME) IV 2 g (07/26/22 0948)   DAPTOmycin (CUBICIN) 650 mg in sodium chloride 0.9 % IVPB 650 mg (07/25/22 1753)   lactated ringers 125 mL/hr at 07/26/22 0150   metronidazole 500 mg (07/26/22 1057)   PRN Meds:.diclofenac Sodium, methocarbamol, oxyCODONE **OR** oxyCODONE, polyethylene glycol, prochlorperazine    Objective: Weight change:   Intake/Output Summary (Last 24 hours) at 07/26/2022 1227 Last data filed at 07/26/2022 0154 Gross per 24 hour  Intake 900.7 ml  Output 1175 ml  Net -274.3 ml   Blood pressure 109/64, pulse 85, temperature 98.2 F (36.8 C), temperature source Oral, resp. rate (!) 21, height 5\' 8"  (1.727 m), weight 79.4 kg, SpO2 100 %. Temp:  [98.2 F (36.8 C)-102.4 F (39.1 C)] 98.2 F  (36.8 C) (07/06 0300) Pulse Rate:  [76-130] 85 (07/06 0900) Resp:  [18-24] 21 (07/06 0900) BP: (80-125)/(48-70) 109/64 (07/06 0900) SpO2:  [91 %-100 %] 100 % (07/06 0900)  Physical Exam: Physical Exam HENT:     Head: Normocephalic and atraumatic.  Cardiovascular:     Rate and Rhythm: Tachycardia present.  Pulmonary:     Effort: Pulmonary effort is normal. No respiratory distress.     Breath sounds: No wheezing.  Abdominal:     General: There is no distension.  Skin:    General: Skin is warm and dry.  Neurological:     Mental Status: He is alert and oriented to person, place, and time.     Comments: paraplegic  Psychiatric:        Mood and Affect: Mood normal.        Behavior: Behavior normal.        Thought Content: Thought content normal.        Judgment: Judgment normal.     Wounds dressed  CBC:    BMET Recent Labs    07/25/22 1618 07/26/22 0720  NA 138 136  K 2.9* 3.7  CL 98 101  CO2  24 25  GLUCOSE 107* 93  BUN 30* 20  CREATININE 1.02 0.68  CALCIUM 8.1* 7.6*     Liver Panel  Recent Labs    07/25/22 0821 07/26/22 0720  PROT 6.8 5.3*  ALBUMIN 1.6* <1.5*  AST 60* 53*  ALT 43 33  ALKPHOS 274* 177*  BILITOT 0.5 1.0       Sedimentation Rate No results for input(s): "ESRSEDRATE" in the last 72 hours. C-Reactive Protein No results for input(s): "CRP" in the last 72 hours.  Micro Results: Recent Results (from the past 720 hour(s))  Resp panel by RT-PCR (RSV, Flu A&B, Covid) Anterior Nasal Swab     Status: None   Collection Time: 07/25/22  7:58 AM   Specimen: Anterior Nasal Swab  Result Value Ref Range Status   SARS Coronavirus 2 by RT PCR NEGATIVE NEGATIVE Final   Influenza A by PCR NEGATIVE NEGATIVE Final   Influenza B by PCR NEGATIVE NEGATIVE Final    Comment: (NOTE) The Xpert Xpress SARS-CoV-2/FLU/RSV plus assay is intended as an aid in the diagnosis of influenza from Nasopharyngeal swab specimens and should not be used as a sole  basis for treatment. Nasal washings and aspirates are unacceptable for Xpert Xpress SARS-CoV-2/FLU/RSV testing.  Fact Sheet for Patients: BloggerCourse.com  Fact Sheet for Healthcare Providers: SeriousBroker.it  This test is not yet approved or cleared by the [place] FDA and has been authorized for detection and/or diagnosis of SARS-CoV-2 by FDA under an Emergency Use Authorization (EUA). This EUA will remain in effect (meaning this test can be used) for the duration of the COVID-19 declaration under Section 564(b)(1) of the Act, 21 U.S.C. section 360bbb-3(b)(1), unless the authorization is terminated or revoked.     Resp Syncytial Virus by PCR NEGATIVE NEGATIVE Final    Comment: (NOTE) Fact Sheet for Patients: BloggerCourse.com  Fact Sheet for Healthcare Providers: SeriousBroker.it  This test is not yet approved or cleared by the [place] FDA and has been authorized for detection and/or diagnosis of SARS-CoV-2 by FDA under an Emergency Use Authorization (EUA). This EUA will remain in effect (meaning this test can be used) for the duration of the COVID-19 declaration under Section 564(b)(1) of the Act, 21 U.S.C. section 360bbb-3(b)(1), unless the authorization is terminated or revoked.  Performed at [facility], [address], [place], [place] [address]   Blood Culture (routine x 2)     Status: None (Preliminary result)   Collection Time: 07/25/22  8:15 AM   Specimen: BLOOD  Result Value Ref Range Status   Specimen Description BLOOD SITE NOT SPECIFIED  Final   Special Requests   Final    BOTTLES DRAWN AEROBIC AND ANAEROBIC Blood Culture results may not be optimal due to an inadequate volume of blood received in culture bottles   Culture  Setup Time   Final    GRAM POSITIVE COCCI IN BOTH AEROBIC AND ANAEROBIC BOTTLES GRAM VARIABLE ROD CRITICAL RESULT  CALLED TO, READ BACK BY AND VERIFIED WITH: Mercer Pod [id number] @ 2221 FH    Culture   Final    GRAM POSITIVE COCCI GRAM VARIABLE ROD TOO YOUNG TO READ Performed at [facility], [address], [place], [place] [address]    Report Status PENDING  Incomplete  Blood Culture (routine x 2)     Status: None (Preliminary result)   Collection Time: 07/25/22  8:21 AM   Specimen: BLOOD  Result Value Ref Range Status   Specimen Description  BLOOD SITE NOT SPECIFIED  Final   Special Requests   Final    BOTTLES DRAWN AEROBIC AND ANAEROBIC Blood Culture results may not be optimal due to an inadequate volume of blood received in culture bottles   Culture  Setup Time   Final    GRAM POSITIVE COCCI IN CHAINS GRAM POSITIVE RODS ANAEROBIC BOTTLE ONLY CRITICAL RESULT CALLED TO, READ BACK BY AND VERIFIED WITH: PHARMD CAREN AMEND ON 07/25/22 @ 1934 BY DRT CRITICAL RESULT CALLED TO, READ BACK BY AND VERIFIED WITH: Mercer Pod [id number] @ 2221 FH GRAM POSITIVE COCCI IN CHAINS GRAM NEGATIVE RODS BOTTLES DRAWN AEROBIC ONLY    Culture   Final    GRAM POSITIVE COCCI IN CHAINS GRAM POSITIVE RODS GRAM NEGATIVE RODS CULTURE REINCUBATED FOR BETTER GROWTH Performed at [facility], [address] [place], [place] [address]    Report Status PENDING  Incomplete  Blood Culture ID Panel (Reflexed)     Status: Abnormal   Collection Time: 07/25/22  8:21 AM  Result Value Ref Range Status   Enterococcus faecalis NOT DETECTED NOT DETECTED Final   Enterococcus Faecium NOT DETECTED NOT DETECTED Final   Listeria monocytogenes NOT DETECTED NOT DETECTED Final   Staphylococcus species NOT DETECTED NOT DETECTED Final   Staphylococcus aureus (BCID) NOT DETECTED NOT DETECTED Final   Staphylococcus epidermidis NOT DETECTED NOT DETECTED Final   Staphylococcus lugdunensis NOT DETECTED NOT DETECTED Final   Streptococcus species DETECTED (A) NOT DETECTED Final    Comment: Not Enterococcus species, Streptococcus  agalactiae, Streptococcus pyogenes, or Streptococcus pneumoniae. CRITICAL RESULT CALLED TO, READ BACK BY AND VERIFIED WITH: Mercer Pod [id number] @ 2221 FH    Streptococcus agalactiae NOT DETECTED NOT DETECTED Final   Streptococcus pneumoniae NOT DETECTED NOT DETECTED Final   Streptococcus pyogenes NOT DETECTED NOT DETECTED Final   A.calcoaceticus-baumannii NOT DETECTED NOT DETECTED Final   Bacteroides fragilis NOT DETECTED NOT DETECTED Final   Enterobacterales DETECTED (A) NOT DETECTED Final    Comment: Enterobacterales represent a large order of gram negative bacteria, not a single organism. CRITICAL RESULT CALLED TO, READ BACK BY AND VERIFIED WITH: Mercer Pod [id number] @ 2221 FH    Enterobacter cloacae complex NOT DETECTED NOT DETECTED Final   Escherichia coli NOT DETECTED NOT DETECTED Final   Klebsiella aerogenes NOT DETECTED NOT DETECTED Final   Klebsiella oxytoca NOT DETECTED NOT DETECTED Final   Klebsiella pneumoniae NOT DETECTED NOT DETECTED Final   Proteus species DETECTED (A) NOT DETECTED Final    Comment: CRITICAL RESULT CALLED TO, READ BACK BY AND VERIFIED WITH: Mercer Pod [id number] @ 2221 FH    Salmonella species NOT DETECTED NOT DETECTED Final   Serratia marcescens NOT DETECTED NOT DETECTED Final   Haemophilus influenzae NOT DETECTED NOT DETECTED Final   Neisseria meningitidis NOT DETECTED NOT DETECTED Final   Pseudomonas aeruginosa NOT DETECTED NOT DETECTED Final   Stenotrophomonas maltophilia NOT DETECTED NOT DETECTED Final   Candida albicans NOT DETECTED NOT DETECTED Final   Candida auris NOT DETECTED NOT DETECTED Final   Candida glabrata NOT DETECTED NOT DETECTED Final   Candida krusei NOT DETECTED NOT DETECTED Final   Candida parapsilosis NOT DETECTED NOT DETECTED Final   Candida tropicalis NOT DETECTED NOT DETECTED Final   Cryptococcus neoformans/gattii NOT DETECTED NOT DETECTED Final   CTX-M ESBL NOT DETECTED NOT DETECTED Final   Carbapenem resistance IMP  NOT DETECTED NOT DETECTED Final   Carbapenem resistance KPC NOT DETECTED NOT DETECTED Final  Carbapenem resistance NDM NOT DETECTED NOT DETECTED Final   Carbapenem resist OXA 48 LIKE NOT DETECTED NOT DETECTED Final   Carbapenem resistance VIM NOT DETECTED NOT DETECTED Final    Comment: Performed at [facility], [address], [place], [place] [address]    Studies/Results: ECHOCARDIOGRAM COMPLETE  Result Date: 07/26/2022    ECHOCARDIOGRAM REPORT   Patient Name:   CHAMP GIALANELLA Date of Exam: 07/26/2022 Medical Rec #:  [id number]      Height:       68.0 in Accession #:    [id number]     Weight:       175.0 lb Date of Birth:  11/12/1970     BSA:          1.931 m Patient Age:    51 years       BP:           109/64 mmHg Patient Gender: M              HR:           89 bpm. Exam Location:  Inpatient Procedure: 2D Echo, Cardiac Doppler and Color Doppler Indications:    Bacteremia R78.81  History:        Patient has prior history of Echocardiogram examinations, most                 recent 09/06/2021. Risk Factors:Dyslipidemia and Former Smoker.  Sonographer:    Dondra Prader RVT RCS Referring Phys: [id number] Kashlyn Salinas N VAN DAM  Sonographer Comments: Technically challenging study due to limited acoustic windows and Technically difficult study due to poor echo windows. Image acquisition challenging due to patient body habitus and supine. IMPRESSIONS  1. No evidence of SBE or obvious vegetations.  2. Left ventricular ejection fraction, by estimation, is 55%. The left ventricle has normal function. The left ventricle has no regional wall motion abnormalities. Left ventricular diastolic parameters were normal.  3. Right ventricular systolic function is normal. The right ventricular size is normal.  4. Left atrial size was mildly dilated.  5. The mitral valve is abnormal. Trivial mitral valve regurgitation. No evidence of mitral stenosis.  6. The aortic valve is tricuspid. There is mild calcification of the aortic valve.  Aortic valve regurgitation is not visualized. Aortic valve sclerosis is present, with no evidence of aortic valve stenosis.  7. The inferior vena cava is normal in size with greater than 50% respiratory variability, suggesting right atrial pressure of 3 mmHg. FINDINGS  Left Ventricle: Left ventricular ejection fraction, by estimation, is 55%. The left ventricle has normal function. The left ventricle has no regional wall motion abnormalities. The left ventricular internal cavity size was normal in size. There is no left ventricular hypertrophy. Left ventricular diastolic parameters were normal. Right Ventricle: The right ventricular size is normal. No increase in right ventricular wall thickness. Right ventricular systolic function is normal. Left Atrium: Left atrial size was mildly dilated. Right Atrium: Right atrial size was normal in size. Pericardium: There is no evidence of pericardial effusion. Mitral Valve: The mitral valve is abnormal. There is mild thickening of the mitral valve leaflet(s). There is mild calcification of the mitral valve leaflet(s). Trivial mitral valve regurgitation. No evidence of mitral valve stenosis. Tricuspid Valve: The tricuspid valve is normal in structure. Tricuspid valve regurgitation is mild . No evidence of tricuspid stenosis. Aortic Valve: The aortic valve is tricuspid. There is mild calcification of the aortic valve. Aortic valve regurgitation is not visualized.  Aortic valve sclerosis is present, with no evidence of aortic valve stenosis. Aortic valve mean gradient measures 4.0 mmHg. Aortic valve peak gradient measures 7.8 mmHg. Aortic valve area, by VTI measures 3.05 cm. Pulmonic Valve: The pulmonic valve was normal in structure. Pulmonic valve regurgitation is not visualized. No evidence of pulmonic stenosis. Aorta: The aortic root is normal in size and structure. Venous: The inferior vena cava is normal in size with greater than 50% respiratory variability, suggesting  right atrial pressure of 3 mmHg. IAS/Shunts: No atrial level shunt detected by color flow Doppler. Additional Comments: No evidence of SBE or obvious vegetations.  LEFT VENTRICLE PLAX 2D LVIDd:         5.60 cm   Diastology LVIDs:         4.00 cm   LV e' medial:    14.80 cm/s LV PW:         1.10 cm   LV E/e' medial:  6.8 LV IVS:        0.90 cm   LV e' lateral:   17.70 cm/s LVOT diam:     2.10 cm   LV E/e' lateral: 5.6 LV SV:         74 LV SV Index:   38 LVOT Area:     3.46 cm  RIGHT VENTRICLE             IVC RV S prime:     16.50 cm/s  IVC diam: 1.80 cm TAPSE (M-mode): 2.1 cm LEFT ATRIUM             Index        RIGHT ATRIUM           Index LA diam:        3.80 cm 1.97 cm/m   RA Area:     12.10 cm LA Vol (A2C):   54.7 ml 28.33 ml/m  RA Volume:   27.20 ml  14.09 ml/m LA Vol (A4C):   46.1 ml 23.87 ml/m LA Biplane Vol: 53.7 ml 27.81 ml/m  AORTIC VALVE                    PULMONIC VALVE AV Area (Vmax):    2.97 cm     PV Vmax:       1.15 m/s AV Area (Vmean):   2.92 cm     PV Peak grad:  5.3 mmHg AV Area (VTI):     3.05 cm AV Vmax:           140.00 cm/s AV Vmean:          93.800 cm/s AV VTI:            0.243 m AV Peak Grad:      7.8 mmHg AV Mean Grad:      4.0 mmHg LVOT Vmax:         120.00 cm/s LVOT Vmean:        79.200 cm/s LVOT VTI:          0.214 m LVOT/AV VTI ratio: 0.88  AORTA Ao Root diam: 3.70 cm Ao Asc diam:  3.00 cm MITRAL VALVE                TRICUSPID VALVE MV Area (PHT): 5.02 cm     TR Peak grad:   7.0 mmHg MV Decel Time: 151 msec     TR Vmax:        132.00 cm/s MV E velocity: 100.00 cm/s MV A  velocity: 86.20 cm/s   SHUNTS MV E/A ratio:  1.16         Systemic VTI:  0.21 m                             Systemic Diam: 2.10 cm Charlton Haws MD Electronically signed by Charlton Haws MD Signature Date/Time: 07/26/2022/11:01:43 AM    Final    CT ABDOMEN PELVIS W CONTRAST  Result Date: 07/25/2022 CLINICAL DATA:  Sepsis.  History of decubitus ulcers. EXAM: CT ABDOMEN AND PELVIS WITH CONTRAST TECHNIQUE:  Multidetector CT imaging of the abdomen and pelvis was performed using the standard protocol following bolus administration of intravenous contrast. RADIATION DOSE REDUCTION: This exam was performed according to the departmental dose-optimization program which includes automated exposure control, adjustment of the mA and/or kV according to patient size and/or use of iterative reconstruction technique. CONTRAST:  75mL OMNIPAQUE IOHEXOL 350 MG/ML SOLN COMPARISON:  09/09/2021 FINDINGS: Lower chest: No acute abnormality. Hepatobiliary: No focal liver abnormality is seen. No gallstones, gallbladder wall thickening, or biliary dilatation. Pancreas: Unremarkable. No pancreatic ductal dilatation or surrounding inflammatory changes. Spleen: Splenomegaly measuring 14.6 cm in cranial caudal dimension. No focal splenic lesion. Adrenals/Urinary Tract: Normal adrenal glands. Cyst within inferior pole of the left kidney measures 1.1 cm. Too small to characterize cyst within the posterolateral cortex of the right kidney measures 5 mm. These are both compatible with Bosniak class 1 and 2 cysts, respectively. No follow-up imaging recommended.No kidney stones or hydroureter. T1He urinary bladder is decompressed around a suprapubic catheter. Mild diffuse bladder wall thickening. Three large calcifications are again noted within the lumen of the bladder which measure up to 3.6 cm Stomach/Bowel: Normal stomach. Duodenal diverticulum noted. The second portion of the scratch the second and third portions of the duodenum do not completely cross the midline and most of the small bowel loops are located in the right hemiabdomen compatible with a malrotation variant. Cecum and normal appendix are identified in the right lower quadrant of the abdomen. No pathologic dilatation of the large or small bowel loops to suggest an obstruction. No bowel wall thickening or inflammation. Vascular/Lymphatic: Normal appearance of the abdominal aorta. There  is aortocaval lymph node measuring 1 cm, image 50/3. Formally this measured the same. Pre caval lymph node measures 1 cm, image 64/3. Previously 1.2 cm. Bilateral inguinal adenopathy is again seen and appears similar to the previous exam. Reproductive: Calcifications noted within the mildly enlarged prostate gland. Other: No free fluid or fluid collections within the abdomen or pelvis. Fat containing right inguinal hernia. Musculoskeletal: Bilateral decubitus ulceration is identified overlying the posterior aspect of both hips and bilateral ischium. Severe hip dysplasia and advanced degenerative changes and bone destruction seen in the hips bilaterally. There is gas and fluid noted in the soft tissues surrounding the left hip as well as within the left hip joint. Progressive bony erosion similar appearance of the left acetabulum, image 87/3. New gas and fluid noted within the soft tissues surrounding the right hip as well as the right hip joint. Stable destructive changes within the right hip there is a large open wound containing gas and debris which extends up to the acetabulum where similar bony destruction of the acetabulum and right femoral head., image 87/3. Soft tissue ulceration, subcutaneous gas and soft tissue thickening along the left gluteal fold towards the base of penis. Signs chronic osteomyelitis with increased sclerosis and bony destruction within bilateral pubic rami.  No drainable fluid collections identified within the pelvis. IMPRESSION: 1. Bilateral decubitus ulceration overlying the posterior aspect of both hips and bilateral ischium. There is gas and fluid noted in the soft tissues surrounding the hips as well as within the hip joints bilaterally. Findings are concerning for septic arthritis. 2. Soft tissue ulceration, subcutaneous gas and soft tissue thickening along the left gluteal fold towards the base of penis. 3. Signs of chronic osteomyelitis with increased sclerosis and bony  destruction within bilateral pubic rami. 4. Splenomegaly. 5. Three large calcifications within the lumen of the urinary bladder which measure up to 3.6 cm. Unchanged from previous exam. 6. Stable retroperitoneal and bilateral inguinal adenopathy. 7. Malrotation variant of the bowel. No evidence of bowel obstruction. 8. Fat containing right inguinal hernia. Electronically Signed   By: Signa Kell M.D.   On: 07/25/2022 09:55   DG Chest 1 View  Result Date: 07/25/2022 CLINICAL DATA:  Larey Seat from wheelchair. EXAM: CHEST  1 VIEW COMPARISON:  09/03/2021 FINDINGS: Stable cardiomediastinal contours. Lung volumes are low. No pleural fluid, interstitial edema or airspace disease. Visualized osseous structures are unremarkable. IMPRESSION: Low lung volumes. No acute cardiopulmonary disease. Electronically Signed   By: Signa Kell M.D.   On: 07/25/2022 08:46   DG Shoulder Right  Result Date: 07/25/2022 CLINICAL DATA:  Shoulder pain after fall.  Larey Seat out of wheelchair. EXAM: RIGHT SHOULDER - 2+ VIEW COMPARISON:  None Available. FINDINGS: Mildly decreased bone mineralization. Moderate acromioclavicular joint space narrowing and peripheral osteophytosis. No significant glenohumeral osteoarthritis. Mild distal lateral subacromial spurring. No acute fracture or dislocation. IMPRESSION: 1. Moderate acromioclavicular osteoarthritis. 2. Mild distal lateral subacromial spurring. Electronically Signed   By: Neita Garnet M.D.   On: 07/25/2022 08:45      Assessment/Plan:  INTERVAL HISTORY: Multiple organisms growing from blood cultures   Principal Problem:   Septic shock (HCC) Active Problems:   Right shoulder pain   Decubitus ulcer due to spina bifida (HCC)   Hypokalemia   Asymptomatic bacteriuria   Anemia    Anthony [name] is a 52 y.o. 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  Along with Flagyl now readmitted with sepsis and worsening of his wounds and recurrence of septic arthritis on the left and now septic arthritis on the right with chronic osteomyelitis of his pubic rami, gas in subcutaneous gluteus to penis. Blood cultures are growing gram-positive cocci in chains in 2 of 2 sites gram-positive rods and gram-negative rods with BC ID identifying and Enterobacter.  The patient is on daptomycin and cefepime and Flagyl.  #1 Polymicrobial bacteremia with sepsis: Continue cefepime daptomycin and Flagyl.  Follow-up cultures and adjust antibiotics accordingly.  Repeat blood cultures today.  2D echocardiogram will be obtained  #2 l bilateral septic arthritis and chronic pelvic osteomyelitis:  Patient potentially to be seen by Dr. [name] though he was reluctant to be seen by him  #3 Soft tissue infection involving gluteus to penis on imaging  Is this something that General Surgery/Urology would address  #4 hx of endocarditis: will repeat TTE  I have personally spent 52 minutes involved in face-to-face and non-face-to-face activities for this patient on the day of the visit. Professional time spent includes the following activities: Preparing to see the patient (review  of tests), Obtaining and/or reviewing separately obtained history (admission/discharge record), Performing a medically appropriate examination and/or evaluation , Ordering medications/tests/procedures, referring and communicating with other health care professionals, Documenting clinical information in the EMR, Independently interpreting results (not separately reported), Communicating results to the patient/family/caregiver, Counseling and educating the patient/family/caregiver and Care coordination  (not separately reported).      LOS: 0 days   Acey Lav 07/26/2022, 12:27 PM

## 2022-07-26 NOTE — Progress Notes (Signed)
OT Cancellation Note  Patient Details Name: Anthony [name] MRN: [id number] DOB: [birth year]   Cancelled Treatment:    Reason Eval/Treat Not Completed: Medical issues which prohibited therapy (hemoglobin of 5.9)  Evern Bio 07/26/2022, 8:42 AM Berna Spare, OTR/L Acute Rehabilitation Services Office: [phone]

## 2022-07-26 NOTE — Evaluation (Signed)
Occupational Therapy Evaluation Patient Details Name: Anthony [name] MRN: [id number] DOB: [birth year] Today's Date: 07/26/2022   History of Present Illness Pt is a 52 y.o. male who presented 07/25/22 with weakness and a fall. Pt admitted with severe sepsis secondary to sacral decubitus ulcer infection with concern for hip septic arthritis noted on CT. Imaging of R shoulder showed moderate acromioclavicular OA & mild distal lasteral subacromial spurring. PMH: complete paraplegia, multiple toe amputations, chronic sacral wounds, neurogenic bladder with suprapubic catheter, spina bifida, HTN   Clinical Impression   Pt is independent and lives alone. He laterally transfers to his w/c and propels independently. Pt rolls side to side and manages pericare. Presents with severe R shoulder pain limiting all mobility to +2 total assist and inability to use functionally in ADLs. He is self feeding and grooming with set up using his L hand. Pt has potential to return to modified independence as his pain improves. Patient will benefit from intensive inpatient follow up therapy, >3 hours/day      Recommendations for follow up therapy are one component of a multi-disciplinary discharge planning process, led by the attending physician.  Recommendations may be updated based on patient status, additional functional criteria and insurance authorization.   Assistance Recommended at Discharge Frequent or constant Supervision/Assistance  Patient can return home with the following Two people to help with walking and/or transfers;A lot of help with bathing/dressing/bathroom;Assistance with cooking/housework;Assistance with feeding;Direct supervision/assist for medications management;Assist for transportation;Help with stairs or ramp for entrance    Functional Status Assessment  Patient has had a recent decline in their functional status and demonstrates the ability to make significant improvements in function in a  reasonable and predictable amount of time.  Equipment Recommendations  Hospital bed    Recommendations for Other Services       Precautions / Restrictions Precautions Precautions: Fall;Other (comment) Precaution Comments: paraplegia Restrictions Weight Bearing Restrictions: No      Mobility Bed Mobility Overal bed mobility: Needs Assistance Bed Mobility: Supine to Sit, Sit to Supine     Supine to sit: Total assist, +2 for physical assistance, +2 for safety/equipment, HOB elevated Sit to supine: Total assist, +2 for physical assistance, +2 for safety/equipment, HOB elevated   General bed mobility comments: Pt holding onto therapist's hand with his L to try to pull himself up to sit L EOB but ended up transitioning his L hand to support his R arm due to pain, total assist x2 to ascend trunk and manage legs off L EOB. Total assist x2 to manage trunk and legs back to supine with pt supporting his R UE with his L for pain management.    Transfers                   General transfer comment: deferred due to pain      Balance Overall balance assessment: Needs assistance   Sitting balance-Leahy Scale: Zero Sitting balance - Comments: Pt leaning posteriorly and to the L, limited by R shoulder pain, needing max-total assist for balance. Pt resting R elbow on folded pillow with moments of improved relaxation and pain.                                   ADL either performed or assessed with clinical judgement   ADL Overall ADL's : Needs assistance/impaired Eating/Feeding: Minimal assistance;Bed level Eating/Feeding Details (indicate cue type and reason): self feeds with  L hand Grooming: Bed level;Set up Grooming Details (indicate cue type and reason): with L hand Upper Body Bathing: Maximal assistance;Bed level   Lower Body Bathing: Total assistance;Bed level   Upper Body Dressing : Maximal assistance;Bed level   Lower Body Dressing: Total assistance;Bed  level       Toileting- Clothing Manipulation and Hygiene: Total assistance;+2 for physical assistance;Bed level               Vision Ability to See in Adequate Light: 0 Adequate Patient Visual Report: No change from baseline       Perception     Praxis      Pertinent Vitals/Pain Pain Assessment Pain Assessment: Faces Faces Pain Scale: Hurts whole lot Pain Location: R shoulder Pain Descriptors / Indicators: Discomfort, Grimacing, Guarding, Moaning Pain Intervention(s): Monitored during session, Repositioned, Premedicated before session     Hand Dominance Right   Extremity/Trunk Assessment Upper Extremity Assessment Upper Extremity Assessment: RUE deficits/detail RUE Deficits / Details: significant shoulder pain with passive movement, elbow pain with active movement, wrist and hand WNL RUE Coordination: decreased gross motor   Lower Extremity Assessment Lower Extremity Assessment: Defer to PT evaluation   Cervical / Trunk Assessment Cervical / Trunk Assessment: Other exceptions Cervical / Trunk Exceptions: weakness   Communication Communication Communication: No difficulties   Cognition Arousal/Alertness: Awake/alert Behavior During Therapy: WFL for tasks assessed/performed Overall Cognitive Status: Within Functional Limits for tasks assessed                                       General Comments       Exercises     Shoulder Instructions      Home Living Family/patient expects to be discharged to:: Private residence Living Arrangements: Alone Available Help at Discharge: Family;Available PRN/intermittently Type of Home: Apartment Home Access: Ramped entrance     Home Layout: One level     Bathroom Shower/Tub: Chief Strategy Officer: Standard     Home Equipment: Wheelchair - manual;Grab bars - tub/shower;Transport chair;Other (comment)   Additional Comments: has ordered a sliding board      Prior  Functioning/Environment Prior Level of Function : Independent/Modified Independent             Mobility Comments: able to perform transfers in/out of his WC without difficulty. Just bought a sliding board but has not used it yet ADLs Comments: sits in the tub for bathing, able to complete ADLs w/o assist per pt. Pt able to detect when he needs to have BM and does so on bed pad and cleans himself. Recently obtained transportation services through [facility]        OT Problem List: Decreased strength;Decreased activity tolerance;Impaired balance (sitting and/or standing);Decreased coordination;Impaired UE functional use;Pain      OT Treatment/Interventions: Self-care/ADL training;Balance training;Patient/family education;Therapeutic exercise;DME and/or AE instruction;Therapeutic activities    OT Goals(Current goals can be found in the care plan section) Acute Rehab OT Goals OT Goal Formulation: With patient Time For Goal Achievement: 08/09/22 Potential to Achieve Goals: Good ADL Goals Pt Will Perform Eating: with modified independence;sitting (with R hand) Pt Will Perform Grooming: with modified independence;sitting;bed level (with R hand lead) Pt Will Perform Toileting - Clothing Manipulation and hygiene: with mod assist Pt/caregiver will Perform Home Exercise Program: Increased ROM;Right Upper extremity;Independently (self ROM) Additional ADL Goal #1: Pt will demonstrate fair sitting balance in preparation for lateral transfers  to w/c.  OT Frequency: Min 1X/week    Co-evaluation PT/OT/SLP Co-Evaluation/Treatment: Yes Reason for Co-Treatment: For patient/therapist safety;To address functional/ADL transfers   OT goals addressed during session: ADL's and self-care      AM-PAC OT "6 Clicks" Daily Activity     Outcome Measure Help from another person eating meals?: A Little Help from another person taking care of personal grooming?: A Little Help from another person toileting, which  includes using toliet, bedpan, or urinal?: Total Help from another person bathing (including washing, rinsing, drying)?: A Lot Help from another person to put on and taking off regular upper body clothing?: A Lot Help from another person to put on and taking off regular lower body clothing?: Total 6 Click Score: 12   End of Session Nurse Communication: Patient requests pain meds  Activity Tolerance: Patient limited by pain Patient left: in bed;with call bell/phone within reach;with bed alarm set  OT Visit Diagnosis: Muscle weakness (generalized) (M62.81);Pain Pain - Right/Left: Right Pain - part of body: Shoulder                Time: 1610-9604 OT Time Calculation (min): 31 min Charges:  OT General Charges $OT Visit: 1 Visit OT Evaluation $OT Eval Moderate Complexity: 1 Mod  Berna Spare, OTR/L Acute Rehabilitation Services Office: [phone]   Evern Bio 07/26/2022, 3:04 PM

## 2022-07-26 NOTE — Assessment & Plan Note (Signed)
No dysuria. UA positive for leukocytes and nitrites, seems to be patient's normal urine. He has a foley catheter that he replaces himself monthly. Last time it was changed was a week ago. This is likely 2/2 chronic colonization and most likely NOT contributing to his septic picture. - Urine culture pending

## 2022-07-26 NOTE — Assessment & Plan Note (Addendum)
XR negative for fracture. - Oxycodone 5 mg for mild pain and 10 mg for breakthrough/severe pain - PT/OT

## 2022-07-26 NOTE — Progress Notes (Signed)
Daily Progress Note Intern Pager: [phone]  Patient name: Anthony [name] Medical record number: [id number] Date of birth: 1970/08/07 Age: 52 y.o. Gender: male  Primary Care Provider: Erick Alley, DO Consultants: ID Code Status: FULL  Assessment and Plan: Anthony [name] is a 52 y.o. male presenting with sepsis secondary to chronic decubitus ulcers. Of note, patient does have a history of mitral valve endocarditis in 08/2021.   Patient initially had CBC of 5.9 this AM, but on recheck was 7.2.  Hospital Problem List      Hospital     * (Principal) Sepsis [facility])     Blood pressures have been stable s/p fluid resuscitation.  - Fluids: 136mL/hour - Consulted ID, appreciate recs. -Abx:   - Daptomycin (7/5 - ) - IV Cefepime (7/5 - ) - Flagyl (7/5 - ) - Blood cultures: streptococcus species, enterobacterales, proteus species - Hold BP meds until pressures improve: lisinopril-hydrochlorothiazide  20-25 mg and Toprol 50 mg         Right shoulder pain     XR negative for fracture. - Oxycodone 5 mg for mild pain and 10 mg for breakthrough/severe pain - PT/OT        Decubitus ulcer due to spina bifida [facility])     Patient cleans and packs his own wounds regularly. -Ortho to medically manage over the weekend and reassess on Monday. - wound care consulted, appreciate recs          Hypokalemia     RESOLVED. AM BMP showed resolution with K+ 3.7 - continue daily BMP        Asymptomatic bacteriuria     No dysuria. UA positive for leukocytes and nitrites, seems to be  patient's normal urine. He has a foley catheter that he replaces himself  monthly. Last time it was changed was a week ago. This is likely 2/2  chronic colonization and most likely NOT contributing to his septic  picture. - Urine culture pending        Anemia     Hgb 5.9 on CBC this morning, recheck was 7.2 and above transfusion  threshold. Hgb baseline appears to be around 8.5. No active signs of   bleeding. - transfusion threshold <7 - repeat CBC daily       FEN/GI: Regular Diet PPx: Lovenox Dispo:Home with home health pending clinical improvement . Barriers include blood culture results and antibiotic requirements.   Subjective:  Patient seen this morning in bed. He is in good spirits and reports he is doing "better." He complains of some ongoing pain in Right shoulder but states he knows he can ask for medicine for this when he needs it. Otherwise without complaint.  Objective: Temp:  [98.2 F (36.8 C)-102.4 F (39.1 C)] 98.2 F (36.8 C) (07/06 0300) Pulse Rate:  [76-130] 85 (07/06 0900) Resp:  [16-24] 21 (07/06 0900) BP: (80-125)/(48-75) 109/64 (07/06 0900) SpO2:  [91 %-100 %] 100 % (07/06 0900) Physical Exam: General: no acute distress, awake and alert HEENT: normocephalic, PEERL, poor dentition with missing teeth Cardio: RRR, no murmurs on exam. Pulm: CTA bilaterally. No increased work of breathing, currently on room air Abdominal: soft, non-tender, non-distended Skin: warm and dry MSK: sacral decubitus ulcers on R and L side Neuro: alert and oriented x3, speech normal in content. Psych:  Cognition and judgment appear intact. Alert, communicative  and cooperative.  Laboratory: Most recent CBC Lab Results  Component Value Date   WBC 10.3 07/26/2022   HGB  7.2 (L) 07/26/2022   HCT 25.2 (L) 07/26/2022   MCV 68.9 (L) 07/26/2022   PLT 257 07/26/2022   Most recent BMP    Latest Ref Rng & Units 07/26/2022    7:20 AM  BMP  Glucose 70 - 99 mg/dL 93   BUN 6 - 20 mg/dL 20   Creatinine 8.11 - 1.24 mg/dL 9.14   Sodium 782 - 956 mmol/L 136   Potassium 3.5 - 5.1 mmol/L 3.7   Chloride 98 - 111 mmol/L 101   CO2 22 - 32 mmol/L 25   Calcium 8.9 - 10.3 mg/dL 7.6      Cyndia Skeeters, DO 07/26/2022, 11:09 AM  PGY-1, [facility] [facility] FPTS Intern pager: [phone], text pages welcome Secure chat group [facility] Teaching Service

## 2022-07-26 NOTE — Progress Notes (Signed)
   07/26/22 1701  Vitals  Temp (!) 103.1 F (39.5 C)  Temp Source Oral  BP (!) 104/55  MAP (mmHg) 67  BP Location Right Arm  BP Method Automatic  Patient Position (if appropriate) Lying  Pulse Rate (!) 139  ECG Heart Rate (!) 140  Resp 20  Level of Consciousness  Level of Consciousness Responds to Voice  Oxygen Therapy  SpO2 93 %  O2 Device Room Air   MD made aware of high fever and rounded on patient.

## 2022-07-26 NOTE — Progress Notes (Signed)
   07/26/22 1610  Provider Notification  Provider Name/Title Dr Kennith Gain  Date Provider Notified 07/26/22  Time Provider Notified [phone]  Method of Notification  (securre chat)  Notification Reason Other (Comment);Critical Result (low Hgb)  Test performed and critical result HGB  Date Critical Result Received 07/26/22  Time Critical Result Received 0810  Provider response See new orders  Date of Provider Response 07/26/22  Time of Provider Response [phone]

## 2022-07-27 DIAGNOSIS — B952 Enterococcus as the cause of diseases classified elsewhere: Secondary | ICD-10-CM | POA: Diagnosis not present

## 2022-07-27 DIAGNOSIS — M8618 Other acute osteomyelitis, other site: Secondary | ICD-10-CM | POA: Diagnosis not present

## 2022-07-27 DIAGNOSIS — L8990 Pressure ulcer of unspecified site, unspecified stage: Secondary | ICD-10-CM | POA: Diagnosis not present

## 2022-07-27 DIAGNOSIS — R6521 Severe sepsis with septic shock: Secondary | ICD-10-CM | POA: Diagnosis not present

## 2022-07-27 DIAGNOSIS — M0000 Staphylococcal arthritis, unspecified joint: Secondary | ICD-10-CM | POA: Diagnosis not present

## 2022-07-27 DIAGNOSIS — A419 Sepsis, unspecified organism: Secondary | ICD-10-CM | POA: Diagnosis not present

## 2022-07-27 LAB — BASIC METABOLIC PANEL
Anion gap: 7 (ref 5–15)
BUN: 11 mg/dL (ref 6–20)
CO2: 25 mmol/L (ref 22–32)
Calcium: 7.4 mg/dL — ABNORMAL LOW (ref 8.9–10.3)
Chloride: 101 mmol/L (ref 98–111)
Creatinine, Ser: 0.74 mg/dL (ref 0.61–1.24)
GFR, Estimated: >60 mL/min (ref >60)
Glucose, Bld: 102 mg/dL — ABNORMAL HIGH (ref 70–99)
Potassium: 3.4 mmol/L — ABNORMAL LOW (ref 3.5–5.1)
Sodium: 133 mmol/L — ABNORMAL LOW (ref 135–145)

## 2022-07-27 LAB — CBC
HCT: 21.6 % — ABNORMAL LOW (ref 39.0–52.0)
Hemoglobin: 6.3 g/dL — CL (ref 13.0–17.0)
MCH: 19.6 pg — ABNORMAL LOW (ref 26.0–34.0)
MCHC: 29.2 g/dL — ABNORMAL LOW (ref 30.0–36.0)
MCV: 67.3 fL — ABNORMAL LOW (ref 80.0–100.0)
Platelets: 262 10*3/uL (ref 150–400)
RBC: 3.21 MIL/uL — ABNORMAL LOW (ref 4.22–5.81)
RDW: 18.1 % — ABNORMAL HIGH (ref 11.5–15.5)
WBC: 16.5 10*3/uL — ABNORMAL HIGH (ref 4.0–10.5)
nRBC: 0 % (ref 0.0–0.2)

## 2022-07-27 LAB — TYPE AND SCREEN
Antibody Screen: POSITIVE
Unit division: 0

## 2022-07-27 LAB — CULTURE, BLOOD (ROUTINE X 2): Special Requests: ADEQUATE

## 2022-07-27 LAB — URINE CULTURE

## 2022-07-27 LAB — HEMOGLOBIN AND HEMATOCRIT, BLOOD
HCT: 26 % — ABNORMAL LOW (ref 39.0–52.0)
Hemoglobin: 7.7 g/dL — ABNORMAL LOW (ref 13.0–17.0)

## 2022-07-27 LAB — PREPARE RBC (CROSSMATCH)

## 2022-07-27 LAB — BPAM RBC: Blood Product Expiration Date: 202408082359

## 2022-07-27 MED ORDER — POTASSIUM CHLORIDE 20 MEQ PO PACK
40.0000 meq | PACK | Freq: Once | ORAL | Status: AC
  Administered 2022-07-27: 40 meq via ORAL
  Filled 2022-07-27 (×2): qty 2

## 2022-07-27 NOTE — Plan of Care (Signed)

## 2022-07-27 NOTE — Progress Notes (Addendum)
Daily Progress Note Intern Pager: [phone]  Patient name: Anthony [name] Medical record number: [id number] Date of birth: [birth year] Age: 52 y.o. Gender: male  Primary Care Provider: Erick Alley, DO Consultants: Ortho, ID Code Status: Full  Pt Overview and Major Events to Date:  7/5- admitted  Assessment and Plan: Anthony [name] is a 52 yo male with a history of spina bifida with paraplegia with chronic ulcers and extensive infectious history secondary to this.  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  He presented to the hospital this admission with septic shock, found to have blood culture positive for strep species and Proteus.  The most likely source for this is infection of his decubitus ulcers given gas and fluid noted in the soft tissues on CT on admission.  There is also concern for septic arthritis, Ortho has been consulted and will be involved in his care.  His blood pressures have remained with maps >60, though he has required intermittent fluid boluses to maintain pressures.  He is currently receiving broad-spectrum antibiotics with daptomycin, cefepime, Flagyl pending full blood culture report.   Hospital Problem List      Hospital     * (Principal) Septic shock (HCC)     Blood pressures remain with MAPs in the low 60s, getting intermittent  boluses for BP support. Also getting blood today which should help. Had a  shaking/shivering event last night that was self limited. Echo yesterday  without evidence of endocarditis.  He did have a jump in his white count today from 10>16, uncertain what the significance of this might be, if any.  Afebrile for the past 12 hours or so. - Daptomycin (7/5 - ) - IV Cefepime (7/5 - ) - Flagyl (7/5 - ) - Appreciate ID's assistance in antibiotic management   - BCID: strep spp + proteus, awaiting culture  - Hold home BP meds - Ortho to reassess tomorrow, likely not a great candidate for operative  intervention. If he declines ortho operation,  could consider reaching out  to Gen surg or Urology         Right shoulder pain     XR negative for fracture. - Oxycodone 5 mg for mild pain and 10 mg for breakthrough/severe pain - PT/OT - Has Robaxin, Voltaren, topical lidocaine ordered        Decubitus ulcer due to spina bifida [facility])     Patient cleans and packs his own wounds regularly. -Ortho to medically manage over the weekend and reassess on Monday. - wound care consulted, appreciate recs      Anemia     Hgb 6.3 on CBC this morning, patient preparing to receive blood. Has a  history of requiring transfusions in the past. Evidence of  iron  deficiency during past admission. No evidence of active blood loss at this  Time, though ?if he could be bleeding into his hip. Possible there's a component of malnutrition contributing given  concomitant albumin <1.5 - transfuse 1 unit PRBC, post transfusion H&H - transfusion threshold <7 - repeat CBC daily - Consult to RD    FEN/GI: Regular diet PPx: None--dropping Hgb, concern for active bleed  Dispo: Pending clinical improvement  Barriers include better infectious control.   Subjective:  Says he feels better this morning than he did overnight.  Had an episode of shivering/shaking.  He says these are not terribly unusual for him but this 1 was worse because it caused significant pain in his right shoulder.  Objective: Temp:  [98 F (36.7 C)-103.1 F (39.5 C)] 99.6 F (37.6 C) (07/07 0815) Pulse Rate:  [78-139] 106 (07/07 0815) Resp:  [17-31] 22 (07/07 0815) BP: (89-123)/(38-78) 99/56 (07/07 0815) SpO2:  [93 %-100 %] 100 % (07/07 0800) Physical Exam: General: Acute on chronically ill-appearing, but in good spirits Cardiovascular: Tachycardic, without murmur Respiratory: Normal work of breathing on 2 L nasal cannula, speaking in full sentences Abdomen: Nontender, nondistended Extremities: Chronic deformities and ulcers are present  Laboratory: Most recent CBC Lab  Results  Component Value Date   WBC 16.5 (H) 07/27/2022   HGB 6.3 (LL) 07/27/2022   HCT 21.6 (L) 07/27/2022   MCV 67.3 (L) 07/27/2022   PLT 262 07/27/2022   Most recent BMP    Latest Ref Rng & Units 07/27/2022    1:41 AM  BMP  Glucose 70 - 99 mg/dL 161   BUN 6 - 20 mg/dL 11   Creatinine 0.96 - 1.24 mg/dL 0.45   Sodium 409 - 811 mmol/L 133   Potassium 3.5 - 5.1 mmol/L 3.4   Chloride 98 - 111 mmol/L 101   CO2 22 - 32 mmol/L 25   Calcium 8.9 - 10.3 mg/dL 7.4     Imaging/Diagnostic Tests: No new imaging, tests  Alicia Amel, MD 07/27/2022, 9:55 AM  PGY-3, [place] [facility] FPTS Intern pager: [phone], text pages welcome Secure chat group [facility] Teaching Service

## 2022-07-27 NOTE — Progress Notes (Signed)
FMTS Interim Progress Note  S: To bedside after RN page that pt tachycardic and shivering. Pt reported he has these episodes occasionally, usually improve after a while with tylenol/ibuprofen. Denies CP/SOB  O: BP 104/61 (BP Location: Right Arm)   Pulse (!) 114   Temp 98.3 F (36.8 C) (Oral)   Resp 20   Ht 5\' 8"  (1.727 m)   Wt 79.4 kg   SpO2 100%   BMI 26.61 kg/m    Awake, alert, appropriate mentation. Pt shivering diffusely. Tachycardic, regular rhythm, lungs CTAB.  A/P:  Hemodynamically stable EKG completed - stable, sinus tachy, no acute evidence of ischemia Suspect severe anemia may be contributing Continue current plan with 1u pRBC  Vonna Drafts, MD 07/27/2022, 6:54 AM PGY-2, [facility] Family [facility] Service pager [phone]

## 2022-07-27 NOTE — Assessment & Plan Note (Signed)
Hgb 7.7 this AM after 1x pRBC on CBC this morning. Has a  history of requiring transfusions in the past. Evidence of  iron  deficiency during past admission. No evidence of active blood loss at this  Time, though ?if he could be bleeding into his hip. Possible there's a component of malnutrition contributing given  concomitant albumin <1.5 - transfusion threshold <7 - repeat CBC daily - Consult to RD

## 2022-07-27 NOTE — Progress Notes (Signed)
Subjective:  Says he had a rough night where he had recurrent sitting of his fevers and worsening of his tachycardia   Antibiotics:  Anti-infectives (From admission, onward)    Start     Dose/Rate Route Frequency Ordered Stop   07/26/22 1800  ceFEPIme (MAXIPIME) 2 g in sodium chloride 0.9 % 100 mL IVPB        2 g 200 mL/hr over 30 Minutes Intravenous Every 8 hours 07/26/22 1745     07/25/22 2100  ceFEPIme (MAXIPIME) 2 g in sodium chloride 0.9 % 100 mL IVPB  Status:  Discontinued        2 g 200 mL/hr over 30 Minutes Intravenous Every 12 hours 07/25/22 0926 07/26/22 1745   07/25/22 2100  metroNIDAZOLE (FLAGYL) IVPB 500 mg        500 mg 100 mL/hr over 60 Minutes Intravenous Every 12 hours 07/25/22 1900     07/25/22 1700  DAPTOmycin (CUBICIN) 650 mg in sodium chloride 0.9 % IVPB        8 mg/kg  79.4 kg 126 mL/hr over 30 Minutes Intravenous Daily 07/25/22 1546     07/25/22 0800  ceFEPIme (MAXIPIME) 2 g in sodium chloride 0.9 % 100 mL IVPB        2 g 200 mL/hr over 30 Minutes Intravenous  Once 07/25/22 0758 07/25/22 0942   07/25/22 0800  metroNIDAZOLE (FLAGYL) IVPB 500 mg        500 mg 100 mL/hr over 60 Minutes Intravenous  Once 07/25/22 0758 07/25/22 1159       Medications: Scheduled Meds:  sodium chloride   Intravenous Once   acetaminophen  500 mg Oral Q6H   flecainide  50 mg Oral BID   lidocaine  1-2 patch Transdermal Q24H   sodium hypochlorite   Irrigation Daily   Continuous Infusions:  ceFEPime (MAXIPIME) IV 2 g (07/27/22 1111)   DAPTOmycin (CUBICIN) 650 mg in sodium chloride 0.9 % IVPB 650 mg (07/27/22 1518)   lactated ringers 125 mL/hr at 07/27/22 1335   metronidazole 500 mg (07/27/22 0850)   PRN Meds:.diclofenac Sodium, methocarbamol, oxyCODONE **OR** oxyCODONE, polyethylene glycol, prochlorperazine    Objective: Weight change:   Intake/Output Summary (Last 24 hours) at 07/27/2022 1623 Last data filed at 07/27/2022 1102 Gross per 24 hour  Intake  4119.91 ml  Output 200 ml  Net 3919.91 ml    Blood pressure 107/64, pulse 90, temperature 98.4 F (36.9 C), temperature source Oral, resp. rate (!) 27, height 5\' 8"  (1.727 m), weight 79.4 kg, SpO2 96 %. Temp:  [98 F (36.7 C)-103.1 F (39.5 C)] 98.4 F (36.9 C) (07/07 1102) Pulse Rate:  [78-139] 90 (07/07 1102) Resp:  [17-31] 27 (07/07 1102) BP: (89-115)/(38-67) 107/64 (07/07 1102) SpO2:  [93 %-100 %] 96 % (07/07 1102)  Physical Exam: Physical Exam HENT:     Head: Normocephalic and atraumatic.  Cardiovascular:     Rate and Rhythm: Tachycardia present.  Pulmonary:     Effort: Pulmonary effort is normal. No respiratory distress.     Breath sounds: No wheezing.  Abdominal:     General: There is no distension.  Skin:    General: Skin is warm and dry.     Coloration: Skin is pale.  Neurological:     Mental Status: He is alert and oriented to person, place, and time.     Comments: paraplegic  Psychiatric:        Mood and Affect: Mood normal.  Behavior: Behavior normal.        Thought Content: Thought content normal.        Judgment: Judgment normal.     Wounds dressed  CBC:    BMET Recent Labs    07/26/22 0720 07/27/22 0141  NA 136 133*  K 3.7 3.4*  CL 101 101  CO2 25 25  GLUCOSE 93 102*  BUN 20 11  CREATININE 0.68 0.74  CALCIUM 7.6* 7.4*      Liver Panel  Recent Labs    07/25/22 0821 07/26/22 0720  PROT 6.8 5.3*  ALBUMIN 1.6* <1.5*  AST 60* 53*  ALT 43 33  ALKPHOS 274* 177*  BILITOT 0.5 1.0        Sedimentation Rate No results for input(s): "ESRSEDRATE" in the last 72 hours. C-Reactive Protein No results for input(s): "CRP" in the last 72 hours.  Micro Results: Recent Results (from the past 720 hour(s))  Resp panel by RT-PCR (RSV, Flu A&B, Covid) Anterior Nasal Swab     Status: None   Collection Time: 07/25/22  7:58 AM   Specimen: Anterior Nasal Swab  Result Value Ref Range Status   SARS Coronavirus 2 by RT PCR NEGATIVE  NEGATIVE Final   Influenza A by PCR NEGATIVE NEGATIVE Final   Influenza B by PCR NEGATIVE NEGATIVE Final    Comment: (NOTE) The Xpert Xpress SARS-CoV-2/FLU/RSV plus assay is intended as an aid in the diagnosis of influenza from Nasopharyngeal swab specimens and should not be used as a sole basis for treatment. Nasal washings and aspirates are unacceptable for Xpert Xpress SARS-CoV-2/FLU/RSV testing.  Fact Sheet for Patients: BloggerCourse.com  Fact Sheet for Healthcare Providers: SeriousBroker.it  This test is not yet approved or cleared by the [place] FDA and has been authorized for detection and/or diagnosis of SARS-CoV-2 by FDA under an Emergency Use Authorization (EUA). This EUA will remain in effect (meaning this test can be used) for the duration of the COVID-19 declaration under Section 564(b)(1) of the Act, 21 U.S.C. section 360bbb-3(b)(1), unless the authorization is terminated or revoked.     Resp Syncytial Virus by PCR NEGATIVE NEGATIVE Final    Comment: (NOTE) Fact Sheet for Patients: BloggerCourse.com  Fact Sheet for Healthcare Providers: SeriousBroker.it  This test is not yet approved or cleared by the [place] FDA and has been authorized for detection and/or diagnosis of SARS-CoV-2 by FDA under an Emergency Use Authorization (EUA). This EUA will remain in effect (meaning this test can be used) for the duration of the COVID-19 declaration under Section 564(b)(1) of the Act, 21 U.S.C. section 360bbb-3(b)(1), unless the authorization is terminated or revoked.  Performed at [facility], [address] [place], [place] [address]   Blood Culture (routine x 2)     Status: Abnormal (Preliminary result)   Collection Time: 07/25/22  8:15 AM   Specimen: BLOOD  Result Value Ref Range Status   Specimen Description BLOOD SITE NOT SPECIFIED  Final    Special Requests   Final    BOTTLES DRAWN AEROBIC AND ANAEROBIC Blood Culture results may not be optimal due to an inadequate volume of blood received in culture bottles   Culture  Setup Time   Final    GRAM POSITIVE COCCI IN BOTH AEROBIC AND ANAEROBIC BOTTLES GRAM VARIABLE ROD CRITICAL RESULT CALLED TO, READ BACK BY AND VERIFIED WITH: Mercer Pod [id number] @ 2221 FH    Culture (A)  Final    PROTEUS VULGARIS GRAM POSITIVE COCCI  GRAM VARIABLE ROD CULTURE REINCUBATED FOR BETTER GROWTH Performed at [facility], [address], [place], [place] [address]    Report Status PENDING  Incomplete  Blood Culture (routine x 2)     Status: None (Preliminary result)   Collection Time: 07/25/22  8:21 AM   Specimen: BLOOD  Result Value Ref Range Status   Specimen Description BLOOD SITE NOT SPECIFIED  Final   Special Requests   Final    BOTTLES DRAWN AEROBIC AND ANAEROBIC Blood Culture results may not be optimal due to an inadequate volume of blood received in culture bottles   Culture  Setup Time   Final    GRAM POSITIVE COCCI IN CHAINS GRAM POSITIVE RODS ANAEROBIC BOTTLE ONLY CRITICAL RESULT CALLED TO, READ BACK BY AND VERIFIED WITH: PHARMD CAREN AMEND ON 07/25/22 @ 1934 BY DRT CRITICAL RESULT CALLED TO, READ BACK BY AND VERIFIED WITH: Mercer Pod [id number] @ 2221 FH GRAM POSITIVE COCCI IN CHAINS GRAM NEGATIVE RODS BOTTLES DRAWN AEROBIC ONLY    Culture   Final    GRAM NEGATIVE RODS IDENTIFICATION AND SUSCEPTIBILITIES TO FOLLOW GRAM POSITIVE COCCI IN CHAINS GRAM POSITIVE RODS CULTURE REINCUBATED FOR BETTER GROWTH Performed at [facility], [address], [place], [place] [address]    Report Status PENDING  Incomplete  Urine Culture     Status: Abnormal   Collection Time: 07/25/22  8:21 AM   Specimen: Urine, Random  Result Value Ref Range Status   Specimen Description URINE, RANDOM  Final   Special Requests   Final    NONE Reflexed from [id number] Performed at [facility], [address] [place], [place] [address]    Culture MULTIPLE SPECIES PRESENT, SUGGEST RECOLLECTION (A)  Final   Report Status 07/27/2022 FINAL  Final  Blood Culture ID Panel (Reflexed)     Status: Abnormal   Collection Time: 07/25/22  8:21 AM  Result Value Ref Range Status   Enterococcus faecalis NOT DETECTED NOT DETECTED Final   Enterococcus Faecium NOT DETECTED NOT DETECTED Final   Listeria monocytogenes NOT DETECTED NOT DETECTED Final   Staphylococcus species NOT DETECTED NOT DETECTED Final   Staphylococcus aureus (BCID) NOT DETECTED NOT DETECTED Final   Staphylococcus epidermidis NOT DETECTED NOT DETECTED Final   Staphylococcus lugdunensis NOT DETECTED NOT DETECTED Final   Streptococcus species DETECTED (A) NOT DETECTED Final    Comment: Not Enterococcus species, Streptococcus agalactiae, Streptococcus pyogenes, or Streptococcus pneumoniae. CRITICAL RESULT CALLED TO, READ BACK BY AND VERIFIED WITH: Mercer Pod [id number] @ 2221 FH    Streptococcus agalactiae NOT DETECTED NOT DETECTED Final   Streptococcus pneumoniae NOT DETECTED NOT DETECTED Final   Streptococcus pyogenes NOT DETECTED NOT DETECTED Final   A.calcoaceticus-baumannii NOT DETECTED NOT DETECTED Final   Bacteroides fragilis NOT DETECTED NOT DETECTED Final   Enterobacterales DETECTED (A) NOT DETECTED Final    Comment: Enterobacterales represent a large order of gram negative bacteria, not a single organism. CRITICAL RESULT CALLED TO, READ BACK BY AND VERIFIED WITH: Mercer Pod [id number] @ 2221 FH    Enterobacter cloacae complex NOT DETECTED NOT DETECTED Final   Escherichia coli NOT DETECTED NOT DETECTED Final   Klebsiella aerogenes NOT DETECTED NOT DETECTED Final   Klebsiella oxytoca NOT DETECTED NOT DETECTED Final   Klebsiella pneumoniae NOT DETECTED NOT DETECTED Final   Proteus species DETECTED (A) NOT DETECTED Final    Comment: CRITICAL RESULT CALLED TO, READ BACK BY AND VERIFIED WITH: PHARMD V.  Annice Needy  [id number] @ 2221 FH    Salmonella species NOT DETECTED NOT DETECTED Final   Serratia marcescens NOT DETECTED NOT DETECTED Final   Haemophilus influenzae NOT DETECTED NOT DETECTED Final   Neisseria meningitidis NOT DETECTED NOT DETECTED Final   Pseudomonas aeruginosa NOT DETECTED NOT DETECTED Final   Stenotrophomonas maltophilia NOT DETECTED NOT DETECTED Final   Candida albicans NOT DETECTED NOT DETECTED Final   Candida auris NOT DETECTED NOT DETECTED Final   Candida glabrata NOT DETECTED NOT DETECTED Final   Candida krusei NOT DETECTED NOT DETECTED Final   Candida parapsilosis NOT DETECTED NOT DETECTED Final   Candida tropicalis NOT DETECTED NOT DETECTED Final   Cryptococcus neoformans/gattii NOT DETECTED NOT DETECTED Final   CTX-M ESBL NOT DETECTED NOT DETECTED Final   Carbapenem resistance IMP NOT DETECTED NOT DETECTED Final   Carbapenem resistance KPC NOT DETECTED NOT DETECTED Final   Carbapenem resistance NDM NOT DETECTED NOT DETECTED Final   Carbapenem resist OXA 48 LIKE NOT DETECTED NOT DETECTED Final   Carbapenem resistance VIM NOT DETECTED NOT DETECTED Final    Comment: Performed at [facility], [address] [place], [place] [address]  Culture, blood (Routine X 2) w Reflex to ID Panel     Status: None (Preliminary result)   Collection Time: 07/26/22  5:55 PM   Specimen: BLOOD  Result Value Ref Range Status   Specimen Description BLOOD LEFT ANTECUBITAL  Final   Special Requests   Final    BOTTLES DRAWN AEROBIC AND ANAEROBIC Blood Culture adequate volume   Culture   Final    NO GROWTH < 12 HOURS Performed at [facility], [address], [place], [place] [address]    Report Status PENDING  Incomplete  Culture, blood (Routine X 2) w Reflex to ID Panel     Status: None (Preliminary result)   Collection Time: 07/26/22  6:05 PM   Specimen: BLOOD LEFT HAND  Result Value Ref Range Status   Specimen Description BLOOD LEFT HAND  Final   Special Requests   Final     BOTTLES DRAWN AEROBIC AND ANAEROBIC Blood Culture adequate volume   Culture   Final    NO GROWTH < 12 HOURS Performed at [facility], [address], [place], [place] [address]    Report Status PENDING  Incomplete    Studies/Results: ECHOCARDIOGRAM COMPLETE  Result Date: 07/26/2022    ECHOCARDIOGRAM REPORT   Patient Name:   Anthony [name] Date of Exam: 07/26/2022 Medical Rec #:  [id number]      Height:       68.0 in Accession #:    [id number]     Weight:       175.0 lb Date of Birth:  Jan 03, 1971     BSA:          1.931 m Patient Age:    51 years       BP:           109/64 mmHg Patient Gender: M              HR:           89 bpm. Exam Location:  Inpatient Procedure: 2D Echo, Cardiac Doppler and Color Doppler Indications:    Bacteremia R78.81  History:        Patient has prior history of Echocardiogram examinations, most                 recent 09/06/2021.  Risk Factors:Dyslipidemia and Former Smoker.  Sonographer:    Dondra Prader RVT RCS Referring Phys: [id number] Alese Furniss N VAN DAM  Sonographer Comments: Technically challenging study due to limited acoustic windows and Technically difficult study due to poor echo windows. Image acquisition challenging due to patient body habitus and supine. IMPRESSIONS  1. No evidence of SBE or obvious vegetations.  2. Left ventricular ejection fraction, by estimation, is 55%. The left ventricle has normal function. The left ventricle has no regional wall motion abnormalities. Left ventricular diastolic parameters were normal.  3. Right ventricular systolic function is normal. The right ventricular size is normal.  4. Left atrial size was mildly dilated.  5. The mitral valve is abnormal. Trivial mitral valve regurgitation. No evidence of mitral stenosis.  6. The aortic valve is tricuspid. There is mild calcification of the aortic valve. Aortic valve regurgitation is not visualized. Aortic valve sclerosis is present, with no evidence of aortic valve stenosis.  7. The inferior  vena cava is normal in size with greater than 50% respiratory variability, suggesting right atrial pressure of 3 mmHg. FINDINGS  Left Ventricle: Left ventricular ejection fraction, by estimation, is 55%. The left ventricle has normal function. The left ventricle has no regional wall motion abnormalities. The left ventricular internal cavity size was normal in size. There is no left ventricular hypertrophy. Left ventricular diastolic parameters were normal. Right Ventricle: The right ventricular size is normal. No increase in right ventricular wall thickness. Right ventricular systolic function is normal. Left Atrium: Left atrial size was mildly dilated. Right Atrium: Right atrial size was normal in size. Pericardium: There is no evidence of pericardial effusion. Mitral Valve: The mitral valve is abnormal. There is mild thickening of the mitral valve leaflet(s). There is mild calcification of the mitral valve leaflet(s). Trivial mitral valve regurgitation. No evidence of mitral valve stenosis. Tricuspid Valve: The tricuspid valve is normal in structure. Tricuspid valve regurgitation is mild . No evidence of tricuspid stenosis. Aortic Valve: The aortic valve is tricuspid. There is mild calcification of the aortic valve. Aortic valve regurgitation is not visualized. Aortic valve sclerosis is present, with no evidence of aortic valve stenosis. Aortic valve mean gradient measures 4.0 mmHg. Aortic valve peak gradient measures 7.8 mmHg. Aortic valve area, by VTI measures 3.05 cm. Pulmonic Valve: The pulmonic valve was normal in structure. Pulmonic valve regurgitation is not visualized. No evidence of pulmonic stenosis. Aorta: The aortic root is normal in size and structure. Venous: The inferior vena cava is normal in size with greater than 50% respiratory variability, suggesting right atrial pressure of 3 mmHg. IAS/Shunts: No atrial level shunt detected by color flow Doppler. Additional Comments: No evidence of SBE or  obvious vegetations.  LEFT VENTRICLE PLAX 2D LVIDd:         5.60 cm   Diastology LVIDs:         4.00 cm   LV e' medial:    14.80 cm/s LV PW:         1.10 cm   LV E/e' medial:  6.8 LV IVS:        0.90 cm   LV e' lateral:   17.70 cm/s LVOT diam:     2.10 cm   LV E/e' lateral: 5.6 LV SV:         74 LV SV Index:   38 LVOT Area:     3.46 cm  RIGHT VENTRICLE             IVC RV S  prime:     16.50 cm/s  IVC diam: 1.80 cm TAPSE (M-mode): 2.1 cm LEFT ATRIUM             Index        RIGHT ATRIUM           Index LA diam:        3.80 cm 1.97 cm/m   RA Area:     12.10 cm LA Vol (A2C):   54.7 ml 28.33 ml/m  RA Volume:   27.20 ml  14.09 ml/m LA Vol (A4C):   46.1 ml 23.87 ml/m LA Biplane Vol: 53.7 ml 27.81 ml/m  AORTIC VALVE                    PULMONIC VALVE AV Area (Vmax):    2.97 cm     PV Vmax:       1.15 m/s AV Area (Vmean):   2.92 cm     PV Peak grad:  5.3 mmHg AV Area (VTI):     3.05 cm AV Vmax:           140.00 cm/s AV Vmean:          93.800 cm/s AV VTI:            0.243 m AV Peak Grad:      7.8 mmHg AV Mean Grad:      4.0 mmHg LVOT Vmax:         120.00 cm/s LVOT Vmean:        79.200 cm/s LVOT VTI:          0.214 m LVOT/AV VTI ratio: 0.88  AORTA Ao Root diam: 3.70 cm Ao Asc diam:  3.00 cm MITRAL VALVE                TRICUSPID VALVE MV Area (PHT): 5.02 cm     TR Peak grad:   7.0 mmHg MV Decel Time: 151 msec     TR Vmax:        132.00 cm/s MV E velocity: 100.00 cm/s MV A velocity: 86.20 cm/s   SHUNTS MV E/A ratio:  1.16         Systemic VTI:  0.21 m                             Systemic Diam: 2.10 cm Charlton Haws MD Electronically signed by Charlton Haws MD Signature Date/Time: 07/26/2022/11:01:43 AM    Final       Assessment/Plan:  INTERVAL HISTORY: proteus and streptococcal species ID by BCID and proteus growing on culture with other organisms not yet ID on culture  Other than   Principal Problem:   Gluteal abscess Active Problems:   Right shoulder pain   Decubitus ulcer due to spina bifida (HCC)    Hypokalemia   Septic shock (HCC)   Asymptomatic bacteriuria   Anemia   Polymicrobial bacterial infection   Chronic osteomyelitis involving pelvic region and thigh (HCC)   Acute bacterial endocarditis   Sepsis (HCC)    Anthony [name] is a 52 y.o. male with spina bifida paraplegia chronic decubitus ulcers, chronic left septic arthritis osteomyelitis status post multiple I&D's by orthopedic surgery, multiple cultures including bone cultures growing Enterococcus faecalis and Bacteroides bacteremia with streptococcus anginosus, mitral valve endocarditis presumed to be due to Enterococcus status post ceftriaxone and high-dose ampicillin.  Along with Flagyl now readmitted with sepsis and worsening of his wounds  and recurrence of septic arthritis on the left and now septic arthritis on the right with chronic osteomyelitis of his pubic rami, gas in subcutaneous gluteus to penis. Blood cultures are growing gram-positive cocci in chains in 2 of 2 sites gram-positive rods and gram-negative rods with BC ID identifying and Proteus and Streptococcal species.  Proteus is identified through growth but other organisms including the gram-positive cocci presumably streptococci and gram variable rods and gram-positive rods have not yet been identified  The patient is on daptomycin and cefepime and Flagyl.  #1 Polymicrobial bacteremia with sepsis: Continue cefepime daptomycin Flagyl  Continue follow-up cultures repeat   Repeat cultures no growth so far   Dechocardiogram shows mitral valve thickening and regurgitation  #2 l bilateral septic arthritis and chronic pelvic osteomyelitis:  Patient is now agreeable to be seen by Dr. [name] tomorrow  #3 Soft tissue infection involving gluteus to penis on imaging  In talking the patient it sounds as if the ulceration here is a fairly chronic 1 but it would still be prudent to ask urology to see the patient  #4 hx of endocarditis: Transthoracic echocardiogram shows  thickening of the mitral valve  I have personally spent 35 minutes involved in face-to-face and non-face-to-face activities for this patient on the day of the visit. Professional time spent includes the following activities: Preparing to see the patient (review of tests), Obtaining and/or reviewing separately obtained history (admission/discharge record), Performing a medically appropriate examination and/or evaluation , Ordering medications/tests/procedures, referring and communicating with other health care professionals, Documenting clinical information in the EMR, Independently interpreting results (not separately reported), Communicating results to the patient/family/caregiver, Counseling and educating the patient/family/caregiver and Care coordination (not separately reported).   Dr. Luciana Axe will take over ID care tomorrow.       LOS: 1 day   Acey Lav 07/27/2022, 4:23 PM

## 2022-07-28 DIAGNOSIS — D649 Anemia, unspecified: Secondary | ICD-10-CM

## 2022-07-28 DIAGNOSIS — L0231 Cutaneous abscess of buttock: Secondary | ICD-10-CM | POA: Diagnosis not present

## 2022-07-28 DIAGNOSIS — A419 Sepsis, unspecified organism: Secondary | ICD-10-CM | POA: Diagnosis not present

## 2022-07-28 LAB — BASIC METABOLIC PANEL
Anion gap: 9 (ref 5–15)
BUN: 7 mg/dL (ref 6–20)
CO2: 24 mmol/L (ref 22–32)
Calcium: 7.5 mg/dL — ABNORMAL LOW (ref 8.9–10.3)
Chloride: 101 mmol/L (ref 98–111)
Creatinine, Ser: 0.58 mg/dL — ABNORMAL LOW (ref 0.61–1.24)
GFR, Estimated: >60 mL/min (ref >60)
Glucose, Bld: 91 mg/dL (ref 70–99)
Potassium: 3.4 mmol/L — ABNORMAL LOW (ref 3.5–5.1)
Sodium: 134 mmol/L — ABNORMAL LOW (ref 135–145)

## 2022-07-28 LAB — BPAM RBC: ISSUE DATE / TIME: 202407070755

## 2022-07-28 LAB — TYPE AND SCREEN: DAT, IgG: POSITIVE

## 2022-07-28 LAB — CULTURE, BLOOD (ROUTINE X 2)

## 2022-07-28 MED ORDER — LACTATED RINGERS IV BOLUS
500.0000 mL | Freq: Once | INTRAVENOUS | Status: AC
  Administered 2022-07-28: 500 mL via INTRAVENOUS

## 2022-07-28 MED ORDER — IBUPROFEN 400 MG PO TABS
400.0000 mg | ORAL_TABLET | Freq: Once | ORAL | Status: AC
  Administered 2022-07-28: 400 mg via ORAL
  Filled 2022-07-28: qty 1

## 2022-07-28 MED ORDER — ZINC SULFATE 220 (50 ZN) MG PO CAPS
220.0000 mg | ORAL_CAPSULE | Freq: Every day | ORAL | Status: DC
  Administered 2022-07-28 – 2022-08-05 (×8): 220 mg via ORAL
  Filled 2022-07-28 (×8): qty 1

## 2022-07-28 MED ORDER — ADULT MULTIVITAMIN W/MINERALS CH
1.0000 | ORAL_TABLET | Freq: Every day | ORAL | Status: DC
  Administered 2022-07-28 – 2022-09-01 (×34): 1 via ORAL
  Filled 2022-07-28 (×34): qty 1

## 2022-07-28 MED ORDER — ENSURE ENLIVE PO LIQD
237.0000 mL | Freq: Three times a day (TID) | ORAL | Status: DC
  Administered 2022-07-28 – 2022-09-01 (×84): 237 mL via ORAL

## 2022-07-28 MED ORDER — METRONIDAZOLE 500 MG PO TABS
500.0000 mg | ORAL_TABLET | Freq: Two times a day (BID) | ORAL | Status: DC
  Administered 2022-07-28 – 2022-08-05 (×16): 500 mg via ORAL
  Filled 2022-07-28 (×16): qty 1

## 2022-07-28 MED ORDER — LORATADINE 10 MG PO TABS
10.0000 mg | ORAL_TABLET | Freq: Every day | ORAL | Status: DC
  Administered 2022-07-28 – 2022-09-01 (×35): 10 mg via ORAL
  Filled 2022-07-28 (×35): qty 1

## 2022-07-28 NOTE — Assessment & Plan Note (Signed)
Patient cleans and packs his own wounds regularly.  - touch base with ortho regarding need for surgical intervention.  - ID would like to consult urology regarding wound as it extends to base of penis  - wound care consulted, appreciate recs

## 2022-07-28 NOTE — Progress Notes (Signed)
Physical Therapy Treatment Patient Details Name: Anthony [name] MRN: [id number] DOB: 03/20/1970 Today's Date: 07/28/2022   History of Present Illness Pt is a 52 y.o. male who presented 07/25/22 with weakness and a fall. Pt admitted with severe sepsis secondary to sacral decubitus ulcer infection with concern for hip septic arthritis noted on CT. Imaging of R shoulder showed moderate acromioclavicular OA & mild distal lasteral subacromial spurring. PMH: complete paraplegia, multiple toe amputations, chronic sacral wounds, neurogenic bladder with suprapubic catheter, spina bifida, HTN    PT Comments  Pt see with OT to progress EOB and OOB mobility. Pt with report of significantly improved R shld pain however remains very weak and has minimal active R shld flexion. Pt maxAX2 to transfer to EOB, however was able to maintain EOB balance x 10 min with close min guard and unilateral UE support. Pt deferred OOB transfer to w/c due to wanting his R shld to heal more prior to putting all his weight through it. Acute PT to cont to follow. Continue to recommend AIR as pt was w/c level indep PTA and demonstrates excellent rehab potential to achieve w/c mod I functional level.     Assistance Recommended at Discharge Intermittent Supervision/Assistance  If plan is discharge home, recommend the following:  Can travel by private vehicle    Two people to help with walking and/or transfers;A lot of help with bathing/dressing/bathroom;Assistance with cooking/housework;Assist for transportation;Help with stairs or ramp for entrance      Equipment Recommendations  Hospital bed    Recommendations for Other Services Rehab consult     Precautions / Restrictions Precautions Precautions: Fall;Other (comment) Precaution Comments: paraplegia Restrictions Weight Bearing Restrictions: No     Mobility  Bed Mobility Overal bed mobility: Needs Assistance Bed Mobility: Supine to Sit, Sit to Supine     Supine to  sit: +2 for physical assistance, +2 for safety/equipment, HOB elevated, Max assist Sit to supine: +2 for physical assistance, +2 for safety/equipment, HOB elevated, Max assist   General bed mobility comments: pt able to use R UE more this date but continued to require maxAx2 for trunk elevation and to prevent sliding off edge of air mattress, dependent for LE management back into the bed    Transfers                   General transfer comment: deferred due to pain in R shoulder    Ambulation/Gait               General Gait Details: unable at baseline   Stairs             Wheelchair Mobility     Tilt Bed    Modified Rankin (Stroke Patients Only)       Balance Overall balance assessment: Needs assistance Sitting-balance support: Single extremity supported, Feet unsupported Sitting balance-Leahy Scale: Fair Sitting balance - Comments: pt with posterior bias due to back curve from spina bifida, pt able to maintain midline and EOB balance with close min guard x 10 min. pt able to wash face with R UE with PT providing R elbow and forearm support due to decreased R shld active flexion Postural control: Posterior lean, Left lateral lean     Standing balance comment: unable at baseline                            Cognition Arousal/Alertness: Awake/alert Behavior During Therapy: WFL for tasks assessed/performed Overall  Cognitive Status: Within Functional Limits for tasks assessed                                          Exercises      General Comments General comments (skin integrity, edema, etc.): VSS on RA      Pertinent Vitals/Pain Pain Assessment Pain Assessment: 0-10 Pain Score: 3  Pain Location: R shoulder Pain Descriptors / Indicators: Discomfort, Grimacing, Guarding, Moaning    Home Living                          Prior Function            PT Goals (current goals can now be found in the care  plan section) Acute Rehab PT Goals Patient Stated Goal: to improve and go home PT Goal Formulation: With patient Time For Goal Achievement: 08/09/22 Potential to Achieve Goals: Good Progress towards PT goals: Progressing toward goals    Frequency    Min 1X/week      PT Plan Current plan remains appropriate    Co-evaluation PT/OT/SLP Co-Evaluation/Treatment: Yes Reason for Co-Treatment: For patient/therapist safety;To address functional/ADL transfers PT goals addressed during session: Mobility/safety with mobility;Balance        AM-PAC PT "6 Clicks" Mobility   Outcome Measure  Help needed turning from your back to your side while in a flat bed without using bedrails?: Total Help needed moving from lying on your back to sitting on the side of a flat bed without using bedrails?: Total Help needed moving to and from a bed to a chair (including a wheelchair)?: Total Help needed standing up from a chair using your arms (e.g., wheelchair or bedside chair)?: Total Help needed to walk in hospital room?: Total Help needed climbing 3-5 steps with a railing? : Total 6 Click Score: 6    End of Session   Activity Tolerance: Patient limited by pain Patient left: in bed;with call bell/phone within reach Nurse Communication: Other (comment);Mobility status (OT coordinated muscle relaxers with RN) PT Visit Diagnosis: Muscle weakness (generalized) (M62.81);History of falling (Z91.81);Pain Pain - Right/Left: Right Pain - part of body: Shoulder     Time: 0950-1007 PT Time Calculation (min) (ACUTE ONLY): 17 min  Charges:    $Therapeutic Activity: 8-22 mins PT General Charges $$ ACUTE PT VISIT: 1 Visit                     Lewis Shock, PT, DPT Acute Rehabilitation Services Secure chat preferred Office #: [phone]    Iona Hansen 07/28/2022, 11:56 AM

## 2022-07-28 NOTE — Progress Notes (Signed)
Initial Nutrition Assessment  DOCUMENTATION CODES:      INTERVENTION:  Ensure Plus High Protein po TID, each supplement provides 350 kcal and 20 grams of protein.  MVI with minerals   Zinc 220mg  tablet   RD recommends Arginine since patient is not accepting to juven due to concerns for clogging his FC   Encourage po intake   Education on adequate nutrition    NUTRITION DIAGNOSIS:   Increased nutrient needs related to wound healing as evidenced by estimated needs.   GOAL:   Patient will meet greater than or equal to 90% of their needs   MONITOR:   PO intake, Supplement acceptance, Labs, Weight trends, Skin, I & O's  REASON FOR ASSESSMENT:   Consult Assessment of nutrition requirement/status  ASSESSMENT:   52 y.o. male with PMHx including spina bifida, HTN, osteomyelitis, endocarditis who presents with sepsis 2/2 chronic decubitus ulcer  Visited patient at bedside who reports no appetite for 2 weeks PTA due to feeling very ill. He states he does not know his UBW but can track his weight changes by measuring the circumference of his wrist and lower arm which he reports he could wrap his fingers around easily which was a significant change for him.   RD and patient discussed nutrition plans. RD recommends Juven and he reports "drink mixes" normally clog his catheter so he wishes not to receive that.   Patient reports he drinks Ensure at home and is normally a good eater. He complains of tough meats here and not liking them. RD suggested he order sauces and gravies to add moisture or to get eggs and plant based protein on trays in addition to drinking ONS TID.   He denies chewing/swallowing issues along with N/V/D/C.   Labs: Na 134, K+ 3.4, alk phos 177 Meds: maxipime, flagyl, lactated ringers   Wt: stable wt   07/25/22 79.4 kg  02/21/22 79.4 kg  01/10/22 81.6 kg  10/30/21 76.2 kg   PO: 0-25% po intake x last 3 documented meals  I/O's:  +5.56 L since admission    NUTRITION - FOCUSED PHYSICAL EXAM:  Flowsheet Row Most Recent Value  Orbital Region Mild depletion  Upper Arm Region Unable to assess  [edema]  Thoracic and Lumbar Region No depletion  Buccal Region Mild depletion  Temple Region Mild depletion  Clavicle Bone Region No depletion  Clavicle and Acromion Bone Region No depletion  Scapular Bone Region Unable to assess  Dorsal Hand Unable to assess  [edematous]  Edema (RD Assessment) Moderate  Hair Reviewed  Eyes Reviewed  Mouth Reviewed  [dental caries]  Skin Reviewed  Nails Reviewed       Diet Order:   Diet Order             Diet regular Room service appropriate? Yes; Fluid consistency: Thin  Diet effective now                   EDUCATION NEEDS:   Education needs have been addressed  Skin:  Skin Assessment: Skin Integrity Issues: Skin Integrity Issues:: Stage II, Stage IV Stage II: sacrum Stage IV: sacrum, R hip  Last BM:  unknown  Height:   Ht Readings from Last 1 Encounters:  07/25/22 5\' 8"  (1.727 m)    Weight:   Wt Readings from Last 1 Encounters:  07/25/22 79.4 kg    Ideal Body Weight:     BMI:  Body mass index is 26.61 kg/m.  Estimated Nutritional Needs:  Kcal:  1600-1750  Protein:  95-120 g  Fluid:  >1.7 L    Leodis Rains, RDN, LDN  Clinical Nutrition

## 2022-07-28 NOTE — Progress Notes (Signed)
Occupational Therapy Treatment Patient Details Name: Anthony [name] MRN: [id number] DOB: Jun 08, 1970 Today's Date: 07/28/2022   History of present illness Pt is a 52 y.o. male who presented 07/25/22 with weakness and a fall. Pt admitted with severe sepsis secondary to sacral decubitus ulcer infection with concern for hip septic arthritis noted on CT. Imaging of R shoulder showed moderate acromioclavicular OA & mild distal lasteral subacromial spurring. PMH: complete paraplegia, multiple toe amputations, chronic sacral wounds, neurogenic bladder with suprapubic catheter, spina bifida, HTN   OT comments  Pt premedicated for R shoulder pain prior to visit. Rated 3/10 with full ROM now of R elbow, tolerance for PROM FF to 100 degrees and ability to FF in supine to 30 degrees. Used R shoulder to prop with supine to sit and in sitting with rail. Pt demonstrating fair sitting balance EOB. With support under R elbow able to wash face. Increased edema in R hand, moved BP cuff to L UE and elevated. Instructed pt to perform AROM of hand, wrist and forearm. Verbalized understanding. Continues to be an excellent rehab candidate.    Recommendations for follow up therapy are one component of a multi-disciplinary discharge planning process, led by the attending physician.  Recommendations may be updated based on patient status, additional functional criteria and insurance authorization.    Assistance Recommended at Discharge Frequent or constant Supervision/Assistance  Patient can return home with the following  Two people to help with walking and/or transfers;A lot of help with bathing/dressing/bathroom;Assistance with cooking/housework;Assistance with feeding;Direct supervision/assist for medications management;Assist for transportation;Help with stairs or ramp for entrance   Equipment Recommendations  Hospital bed    Recommendations for Other Services      Precautions / Restrictions Precautions Precautions:  Fall;Other (comment) Precaution Comments: paraplegia Restrictions Weight Bearing Restrictions: No       Mobility Bed Mobility Overal bed mobility: Needs Assistance Bed Mobility: Supine to Sit, Sit to Supine     Supine to sit: +2 for physical assistance, +2 for safety/equipment, HOB elevated, Max assist Sit to supine: +2 for physical assistance, +2 for safety/equipment, HOB elevated, Max assist   General bed mobility comments: pt able to use R UE more this date but continued to require maxAx2 for trunk elevation and to prevent sliding off edge of air mattress, dependent for LE management back into the bed    Transfers                   General transfer comment: deferred due to pain in R shoulder     Balance Overall balance assessment: Needs assistance Sitting-balance support: Single extremity supported, Feet unsupported Sitting balance-Leahy Scale: Fair                                     ADL either performed or assessed with clinical judgement   ADL Overall ADL's : Needs assistance/impaired     Grooming: Sitting;Minimal assistance;Wash/dry face Grooming Details (indicate cue type and reason): support under R elbow Upper Body Bathing: Maximal assistance;Bed level Upper Body Bathing Details (indicate cue type and reason): washed pt's back                                Extremity/Trunk Assessment Upper Extremity Assessment Upper Extremity Assessment: RUE deficits/detail RUE Deficits / Details: PROM to 100 degrees, AROM 30 degrees in supine, full use  of elbow, wrist and hand, increased edema, moved BP cuff to L UE RUE Coordination: decreased gross motor            Vision       Perception     Praxis      Cognition Arousal/Alertness: Awake/alert Behavior During Therapy: WFL for tasks assessed/performed Overall Cognitive Status: Within Functional Limits for tasks assessed                                           Exercises      Shoulder Instructions       General Comments VSS on RA    Pertinent Vitals/ Pain       Pain Assessment Pain Assessment: 0-10 Pain Score: 3  Pain Location: R shoulder Pain Descriptors / Indicators: Discomfort, Grimacing, Guarding, Moaning Pain Intervention(s): Monitored during session, Repositioned  Home Living                                          Prior Functioning/Environment              Frequency  Min 1X/week        Progress Toward Goals  OT Goals(current goals can now be found in the care plan section)  Progress towards OT goals: Progressing toward goals  Acute Rehab OT Goals OT Goal Formulation: With patient Time For Goal Achievement: 08/09/22 Potential to Achieve Goals: Good  Plan Discharge plan remains appropriate    Co-evaluation    PT/OT/SLP Co-Evaluation/Treatment: Yes Reason for Co-Treatment: For patient/therapist safety;To address functional/ADL transfers PT goals addressed during session: Mobility/safety with mobility;Balance OT goals addressed during session: Strengthening/ROM;ADL's and self-care      AM-PAC OT "6 Clicks" Daily Activity     Outcome Measure   Help from another person eating meals?: A Little Help from another person taking care of personal grooming?: A Little Help from another person toileting, which includes using toliet, bedpan, or urinal?: Total Help from another person bathing (including washing, rinsing, drying)?: A Lot Help from another person to put on and taking off regular upper body clothing?: A Lot Help from another person to put on and taking off regular lower body clothing?: Total 6 Click Score: 12    End of Session    OT Visit Diagnosis: Muscle weakness (generalized) (M62.81);Pain Pain - Right/Left: Right Pain - part of body: Shoulder   Activity Tolerance Patient limited by pain   Patient Left in bed;with call bell/phone within reach;with bed alarm set    Nurse Communication Mobility status        Time: 1610-9604 OT Time Calculation (min): 37 min  Charges: OT General Charges $OT Visit: 1 Visit OT Treatments $Self Care/Home Management : 8-22 mins  Berna Spare, OTR/L Acute Rehabilitation Services Office: [phone]  Evern Bio 07/28/2022, 1:25 PM

## 2022-07-28 NOTE — Assessment & Plan Note (Addendum)
Blood pressures gradually increasing with MAPs in 70s-80s. Patient received 1x pRBCs O/N. He states he was feeling sick yesterday but is feeling better this morning. Echo 7/6  without evidence of endocarditis. WBC this AM was 16.5. Patient fevered O/N to 101, received ibuprofen 0600.  - Daptomycin (7/5 - ) - IV Cefepime (7/5 - ) - Flagyl (7/5 - ) - Appreciate ID's assistance in antibiotic management   - BCID: strep spp + proteus, awaiting culture  - Hold home BP meds - ortho  - Consult urology per ID recs

## 2022-07-28 NOTE — Progress Notes (Signed)
Inpatient Rehab Admissions Coordinator:   Per therapy recommendations, patient was screened for CIR candidacy by Megan Salon, MS, CCC-SLP . At this time, Pt. is not tolerating OOB yet and is not at a level to tolerate the intensity of CIR; however,   Pt. may have potential to progress to becoming a potential CIR candidate, so CIR admissions team will follow and monitor for progress and participation with therapies and place consult order if Pt. appears to be an appropriate candidate. Please contact me with any questions.   Megan Salon, MS, CCC-SLP Rehab Admissions Coordinator  [phone] (celll) [phone] (office)

## 2022-07-28 NOTE — Assessment & Plan Note (Signed)
XR negative for fracture. Patient reports his shoulder is feeling better today.  - Oxycodone 5 mg for mild pain and 10 mg for breakthrough/severe pain - PT/OT - Has Robaxin, Voltaren, topical lidocaine ordered

## 2022-07-28 NOTE — Progress Notes (Cosign Needed Addendum)
Daily Progress Note Intern Pager: [phone]  Patient name: Anthony [name] Medical record number: [id number] Date of birth: 26-Aug-1970 Age: 52 y.o. Gender: male  Primary Care Provider: Erick Alley, DO Consultants: Orthopaedics, Infectious Disease  Code Status: Full  Pt Overview and Major Events to Date:  7/5 admitted 7/6 Ortho consulted, stated no acute intervention indicated. ID consulted, recommended BSAbx 7/7: Hg dropped and patient received 1x pRBCs   Assessment and Plan:  Patient is a 52 yo M with PMHx including spina bifida with paraplegia with chronic sacral ulcers who was admitted for septic shock with blood cultures positive for strep species, Enterobacterales, and proteus. Likely source of infection is decubitus ulcers as supported by CT scan showing gas and fluid in soft tissue. Patient has been receiving broad spectrum antibiotics with ID following. Ortho has been consulted re surgical debridement.    Hospital Problem List      Hospital     * (Principal) Gluteal abscess     Right shoulder pain     XR negative for fracture. Patient reports his shoulder is feeling  better today.  - Oxycodone 5 mg for mild pain and 10 mg for breakthrough/severe pain - PT/OT - Has Robaxin, Voltaren, topical lidocaine ordered        Decubitus ulcer due to spina bifida [facility])     Patient cleans and packs his own wounds regularly.  - touch base with ortho regarding need for surgical intervention.  - ID would like to consult urology regarding wound as it extends to base  of penis  - wound care consulted, appreciate recs          Hypokalemia     RESOLVED. AM BMP showed resolution with K+ 3.7 - continue daily BMP        Septic shock (HCC)     Blood pressures gradually increasing with MAPs in 70s-80s. Patient  received 1x pRBCs O/N. He states he was feeling sick yesterday but is  feeling better this morning. Echo 7/6  without evidence of endocarditis.  WBC this AM was 16.5.  Patient fevered O/N to 101, received ibuprofen 0600.   - Daptomycin (7/5 - ) - IV Cefepime (7/5 - ) - Flagyl (7/5 - ) - Appreciate ID's assistance in antibiotic management   - BCID: strep spp + proteus, awaiting culture  - Hold home BP meds - ortho  - Consult urology per ID recs         RESOLVED: Asymptomatic bacteriuria     No dysuria. UA positive for leukocytes and nitrites, seems to be  patient's normal urine. He has a foley catheter that he replaces himself  monthly. Last time it was changed was a week ago. This is likely 2/2  chronic colonization and most likely NOT contributing to his septic  picture. - Urine culture pending        Anemia      Hgb 7.7 this AM after 1x pRBC on CBC this morning. Has a  history of requiring transfusions in the past. Evidence of  iron  deficiency during past admission. No evidence of active blood loss at this   Time, though ?if he could be bleeding into his hip. Possible there's a  component of malnutrition contributing given  concomitant albumin <1.5 - transfusion threshold <7 - repeat CBC daily - Consult to RD        Polymicrobial bacterial infection     Chronic osteomyelitis involving pelvic region and thigh (HCC)  Acute bacterial endocarditis     Sepsis (HCC)    FEN/GI: regular diet  PPx: none at this time as pt has dropping hgb  Dispo: pending clinical improvement     . Barriers include infectious control and stable status.   Subjective:  Patient seen at bedside with Dr. Marisue Humble. Patient reports he is feeling better than he was overnight. He reports he has no complaints at this time other than L shoulder pain. He states with medication, the shoulder pain has been getting better.   Objective: Temp:  [98.4 F (36.9 C)-102.4 F (39.1 C)] 98.4 F (36.9 C) (07/08 1225) Pulse Rate:  [92-113] 92 (07/08 1225) Resp:  [15-28] 20 (07/08 1225) BP: (103-146)/(56-76) 104/66 (07/08 1225) SpO2:  [93 %-99 %] 96 % (07/08 1225) Physical  Exam: General: laying in bed, smiling  Cardiovascular: Tachycardia, no murmurs appreciated  Respiratory: NWOB, normal breath sounds   Abdomen: non tender, normal bowel sounds    Laboratory: Most recent CBC Lab Results  Component Value Date   WBC 16.5 (H) 07/27/2022   HGB 7.7 (L) 07/27/2022   HCT 26.0 (L) 07/27/2022   MCV 67.3 (L) 07/27/2022   PLT 262 07/27/2022   Most recent BMP    Latest Ref Rng & Units 07/28/2022    1:37 AM  BMP  Glucose 70 - 99 mg/dL 91   BUN 6 - 20 mg/dL 7   Creatinine 2.95 - 6.21 mg/dL 3.08   Sodium 657 - 846 mmol/L 134   Potassium 3.5 - 5.1 mmol/L 3.4   Chloride 98 - 111 mmol/L 101   CO2 22 - 32 mmol/L 24   Calcium 8.9 - 10.3 mg/dL 7.5      Imaging/Diagnostic Tests: No new imaging.   Georg Ruddle, Tyliyah Mcmeekin Georg Ruddle, MD 07/28/2022, 4:01 PM  PGY-1, [facility] FPTS Intern pager: [phone], text pages welcome Secure chat group [facility] Teaching Service

## 2022-07-29 ENCOUNTER — Inpatient Hospital Stay (HOSPITAL_COMMUNITY): Payer: Medicare HMO

## 2022-07-29 DIAGNOSIS — Z8679 Personal history of other diseases of the circulatory system: Secondary | ICD-10-CM

## 2022-07-29 DIAGNOSIS — R7881 Bacteremia: Secondary | ICD-10-CM | POA: Diagnosis not present

## 2022-07-29 DIAGNOSIS — L89159 Pressure ulcer of sacral region, unspecified stage: Secondary | ICD-10-CM

## 2022-07-29 DIAGNOSIS — A419 Sepsis, unspecified organism: Secondary | ICD-10-CM | POA: Diagnosis not present

## 2022-07-29 DIAGNOSIS — R0682 Tachypnea, not elsewhere classified: Secondary | ICD-10-CM | POA: Diagnosis not present

## 2022-07-29 DIAGNOSIS — D649 Anemia, unspecified: Secondary | ICD-10-CM | POA: Diagnosis not present

## 2022-07-29 DIAGNOSIS — R0989 Other specified symptoms and signs involving the circulatory and respiratory systems: Secondary | ICD-10-CM | POA: Diagnosis not present

## 2022-07-29 DIAGNOSIS — L0231 Cutaneous abscess of buttock: Secondary | ICD-10-CM | POA: Diagnosis not present

## 2022-07-29 DIAGNOSIS — R059 Cough, unspecified: Secondary | ICD-10-CM | POA: Insufficient documentation

## 2022-07-29 DIAGNOSIS — L89154 Pressure ulcer of sacral region, stage 4: Secondary | ICD-10-CM | POA: Diagnosis not present

## 2022-07-29 DIAGNOSIS — K668 Other specified disorders of peritoneum: Secondary | ICD-10-CM | POA: Diagnosis not present

## 2022-07-29 DIAGNOSIS — M8638 Chronic multifocal osteomyelitis, other site: Secondary | ICD-10-CM | POA: Diagnosis not present

## 2022-07-29 DIAGNOSIS — L98429 Non-pressure chronic ulcer of back with unspecified severity: Secondary | ICD-10-CM

## 2022-07-29 HISTORY — DX: Non-pressure chronic ulcer of back with unspecified severity: L98.429

## 2022-07-29 LAB — CBC
HCT: 22.6 % — ABNORMAL LOW (ref 39.0–52.0)
Hemoglobin: 6.6 g/dL — CL (ref 13.0–17.0)
MCH: 20.4 pg — ABNORMAL LOW (ref 26.0–34.0)
MCHC: 29.2 g/dL — ABNORMAL LOW (ref 30.0–36.0)
MCV: 69.8 fL — ABNORMAL LOW (ref 80.0–100.0)
Platelets: 281 10*3/uL (ref 150–400)
RBC: 3.24 MIL/uL — ABNORMAL LOW (ref 4.22–5.81)
RDW: 20.4 % — ABNORMAL HIGH (ref 11.5–15.5)
WBC: 14 10*3/uL — ABNORMAL HIGH (ref 4.0–10.5)
nRBC: 0 % (ref 0.0–0.2)

## 2022-07-29 LAB — HEMOGLOBIN AND HEMATOCRIT, BLOOD
HCT: 27 % — ABNORMAL LOW (ref 39.0–52.0)
Hemoglobin: 8.1 g/dL — ABNORMAL LOW (ref 13.0–17.0)

## 2022-07-29 LAB — TYPE AND SCREEN

## 2022-07-29 LAB — BASIC METABOLIC PANEL
Anion gap: 9 (ref 5–15)
BUN: 7 mg/dL (ref 6–20)
CO2: 26 mmol/L (ref 22–32)
Calcium: 7.7 mg/dL — ABNORMAL LOW (ref 8.9–10.3)
Chloride: 106 mmol/L (ref 98–111)
Creatinine, Ser: 0.5 mg/dL — ABNORMAL LOW (ref 0.61–1.24)
GFR, Estimated: >60 mL/min (ref >60)
Glucose, Bld: 127 mg/dL — ABNORMAL HIGH (ref 70–99)
Potassium: 3.6 mmol/L (ref 3.5–5.1)
Sodium: 141 mmol/L (ref 135–145)

## 2022-07-29 LAB — CULTURE, BLOOD (ROUTINE X 2)
Culture: NO GROWTH
Special Requests: ADEQUATE

## 2022-07-29 LAB — BPAM RBC
Blood Product Expiration Date: 202408082359
Unit Type and Rh: 5100

## 2022-07-29 LAB — SARS CORONAVIRUS 2 BY RT PCR: SARS Coronavirus 2 by RT PCR: NEGATIVE

## 2022-07-29 LAB — PREPARE RBC (CROSSMATCH)

## 2022-07-29 MED ORDER — SODIUM CHLORIDE 0.9% IV SOLUTION: Freq: Once | INTRAVENOUS | Status: DC

## 2022-07-29 MED ORDER — IBUPROFEN 400 MG PO TABS
400.0000 mg | ORAL_TABLET | Freq: Once | ORAL | Status: AC
  Administered 2022-07-29: 400 mg via ORAL
  Filled 2022-07-29: qty 1

## 2022-07-29 MED ORDER — SODIUM CHLORIDE 0.9% IV SOLUTION: Freq: Once | INTRAVENOUS | Status: AC

## 2022-07-29 MED ORDER — METHOCARBAMOL 500 MG PO TABS
1000.0000 mg | ORAL_TABLET | Freq: Three times a day (TID) | ORAL | Status: DC
  Administered 2022-07-29 – 2022-09-01 (×101): 1000 mg via ORAL
  Filled 2022-07-29 (×105): qty 2

## 2022-07-29 NOTE — Consult Note (Signed)
I have been asked to see the patient by Dr. Pearlean Brownie, for evaluation and management of perineal infection.  History of present illness: 52 year old male with a history of spina bifida with neurogenic bladder presented to the emergency department feeling unwell.  He was noted to be septic thought to be related to the sacral decubitus ulcer, the urinary tract, and osteomyelitis of his hips bilaterally.  Urology was consulted to evaluate the perineum and the concern for subcutaneous air along the base of the penis.  The patient has had intermittent fevers, but no fevers within the last 24 hours.  He has been on broad-spectrum antibiotics and is being followed by infectious disease.   1 - Neurogenic Bladder - spina bifida with neurogenic bladder managed with SPT x years changed Q monthly by the patient himselt.  Known history of bladder stones, which now measures up to 3.6 cm.  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     Review of systems: A 12 point comprehensive review of systems was obtained and is negative unless otherwise stated in the history of present illness.  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    Endocarditis of mitral valve    Hypokalemia    Septic shock (HCC)    Osteomyelitis (HCC)    Pyogenic arthritis of left hip  (HCC)    Septic arthritis of hip (HCC) 09/03/2021   Prolonged Q-T interval on ECG 09/03/2021   Pressure injury of skin of left buttock 05/13/2021   PVCs (premature ventricular contractions)    Atypical chest pain 01/04/2020   Slow transit constipation 03/06/2017   Neurogenic bladder 03/06/2017   Suprapubic catheter (HCC) 03/06/2017   Paraplegia (HCC) 01/29/2017   Decubitus ulcer due to spina bifida (HCC) 01/27/2017   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/24/2017 01/24/2017   Status post debridement 01/24/2017   Hyperlipidemia 03/11/2014   Spina bifida of lumbar region (HCC) 09/30/2012   Routine adult health maintenance 03/26/2012   Right shoulder pain 01/27/2011   ALLERGIC RHINITIS 06/01/2007   Anxiety 03/19/2006   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   Current Outpatient Medications on File Prior to Encounter  Medication Sig Dispense Refill   acetaminophen (TYLENOL) 500 MG tablet Take 1,000 mg by mouth daily as needed for moderate pain, headache or fever.     flecainide (TAMBOCOR) 50 MG tablet Take 1 tablet (50 mg total) by mouth 2 (two) times daily. 180 tablet 3   ibuprofen (ADVIL) 200 MG tablet Take 200 mg by mouth every 6 (six) hours as needed for headache or moderate pain.     lisinopril-hydrochlorothiazide (ZESTORETIC) 20-25 MG tablet Take 1 tablet by mouth daily. 90 tablet 3   metoprolol succinate (TOPROL-XL) 50 MG 24 hr tablet Take 1 tablet (50 mg total)  by mouth 2 (two) times daily. Take with or immediately following a meal. 180 tablet 3   Multiple Vitamin (MULTIVITAMIN WITH MINERALS) TABS tablet Take 1 tablet by mouth daily.     nitroGLYCERIN (NITROSTAT) 0.4 MG SL tablet Place 1 tablet (0.4 mg total) under the tongue every 5 (five) minutes as needed for chest pain. 25 tablet 5   Omega-3 Fatty Acids (FISH OIL PO) Take 1 capsule by mouth  daily.     POTASSIUM CHLORIDE PO Take 1 tablet by mouth daily. OTC potassium supplement     rosuvastatin (CRESTOR) 40 MG tablet Take 1 tablet by mouth once daily (Patient taking differently: Take 40 mg by mouth every evening.) 90 tablet 3   Catheters (FOLEY CATHETER 2-WAY) MISC Use as directed by urologist. 5 each 1    Past Medical History:  Diagnosis Date   Anxiety    Chronic indwelling Foley catheter    Complication of anesthesia    woken up in surgery before   Dysrhythmia    Endocarditis of mitral valve    Hypertension    Osteomyelitis (HCC)    left hip   Paraplegia (HCC)    secondary to Spina Bifida   PVCs (premature ventricular contractions)    Septic shock (HCC)    due to osteomyelitis   Spina bifida    Wheelchair bound     Past Surgical History:  Procedure Laterality Date   APPLICATION OF WOUND VAC  09/11/2021   Procedure: APPLICATION OF WOUND VAC;  Surgeon: [name], MD;  Location: [facility] OR;  Service: Orthopedics;;   BACK SURGERY     BUBBLE STUDY  09/09/2021   Procedure: BUBBLE STUDY;  Surgeon: Quintella Reichert, MD;  Location: [facility] ENDOSCOPY;  Service: Cardiovascular;;   HIP SURGERY     I & D EXTREMITY Left 09/11/2021   Procedure: LEFT HIP DEBRIDEMENT AND REMOVAL FEMORAL HEAD;  Surgeon: [name], MD;  Location: [facility] OR;  Service: Orthopedics;  Laterality: Left;   I & D EXTREMITY Left 09/13/2021   Procedure: DEBRIDEMENT LEFT HIP;  Surgeon: [name], MD;  Location: [facility] OR;  Service: Orthopedics;  Laterality: Left;   I & D EXTREMITY Left 09/18/2021   Procedure: DEBRIDEMENT LEFT HIP, WOUND VAC EXCHANGE;  Surgeon: [name], MD;  Location: [facility] OR;  Service: Orthopedics;  Laterality: Left;   I & D EXTREMITY Left 10/30/2021   Procedure: LEFT HIP DEBRIDEMENT WITH KERECIS PLACEMENT;  Surgeon: [name], MD;  Location: [facility] OR;  Service: Orthopedics;  Laterality: Left;   INCISION AND DRAINAGE OF WOUND N/A 01/24/2017   Procedure: IRRIGATION AND DEBRIDEMENT WOUND-  BUTTOCK ABSCESS;  Surgeon: Almond Lint, MD;  Location: [facility] ORS;  Service: General;  Laterality: N/A;   [name] WITHOUT CARDIOVERSION N/A 09/09/2021   Procedure: TRANSESOPHAGEAL ECHOCARDIOGRAM ([name]);  Surgeon: Quintella Reichert, MD;  Location: [facility] ENDOSCOPY;  Service: Cardiovascular;  Laterality: N/A;    Social History   Tobacco Use   Smoking status: Former    Types: Cigarettes   Smokeless tobacco: Never  Vaping Use   Vaping Use: Never used  Substance Use Topics   Alcohol use: No    Family History  Problem Relation Age of Onset   CAD Father        died age 36 from MI   Sudden Cardiac Death Sister        age 64   CAD Maternal Grandfather        died age 70 MI  PE: Vitals:   07/29/22 1644 07/29/22 1700 07/29/22 1800 07/29/22 1929  BP: 121/78 126/78 131/78 133/78  Pulse: 99 95 (!) 107 100  Resp: 17 (!) 23 (!) 28 20  Temp: 98.1 F (36.7 C)   98.3 F (36.8 C)  TempSrc: Oral   Oral  SpO2: 99% 97% 96% 96%  Weight:      Height:       Patient appears to be in no acute distress  patient is alert and oriented x3 Atraumatic normocephalic head No cervical or supraclavicular lymphadenopathy appreciated No increased work of breathing, no audible wheezes/rhonchi Regular sinus rhythm/rate Abdomen is soft, nontender, nondistended, no CVA or suprapubic tenderness The patient's scrotum and perineum are intact without any evidence of cellulitis or fluctuance. The patient's perirectal area there is not appeared to be cellulitic but rather appears to be fairly healthy and healing. Lower extremities are symmetric without appreciable edema No identifiable skin lesions  Recent Labs    07/27/22 0141 07/27/22 2041 07/29/22 0126 07/29/22 0950  WBC 16.5*  --  14.0*  --   HGB 6.3* 7.7* 6.6* 8.1*  HCT 21.6* 26.0* 22.6* 27.0*   Recent Labs    07/27/22 0141 07/28/22 0137 07/29/22 0126  NA 133* 134* 141  K 3.4* 3.4* 3.6  CL 101 101 106  CO2 25 24 26   GLUCOSE 102* 91 127*  BUN 11 7 7    CREATININE 0.74 0.58* 0.50*  CALCIUM 7.4* 7.5* 7.7*   No results for input(s): "LABPT", "INR" in the last 72 hours. No results for input(s): "LABURIN" in the last 72 hours. Results for orders placed or performed during the hospital encounter of 07/25/22  Resp panel by RT-PCR (RSV, Flu A&B, Covid) Anterior Nasal Swab     Status: None   Collection Time: 07/25/22  7:58 AM   Specimen: Anterior Nasal Swab  Result Value Ref Range Status   SARS Coronavirus 2 by RT PCR NEGATIVE NEGATIVE Final   Influenza A by PCR NEGATIVE NEGATIVE Final   Influenza B by PCR NEGATIVE NEGATIVE Final    Comment: (NOTE) The Xpert Xpress SARS-CoV-2/FLU/RSV plus assay is intended as an aid in the diagnosis of influenza from Nasopharyngeal swab specimens and should not be used as a sole basis for treatment. Nasal washings and aspirates are unacceptable for Xpert Xpress SARS-CoV-2/FLU/RSV testing.  Fact Sheet for Patients: BloggerCourse.com  Fact Sheet for Healthcare Providers: SeriousBroker.it  This test is not yet approved or cleared by the [place] FDA and has been authorized for detection and/or diagnosis of SARS-CoV-2 by FDA under an Emergency Use Authorization (EUA). This EUA will remain in effect (meaning this test can be used) for the duration of the COVID-19 declaration under Section 564(b)(1) of the Act, 21 U.S.C. section 360bbb-3(b)(1), unless the authorization is terminated or revoked.     Resp Syncytial Virus by PCR NEGATIVE NEGATIVE Final    Comment: (NOTE) Fact Sheet for Patients: BloggerCourse.com  Fact Sheet for Healthcare Providers: SeriousBroker.it  This test is not yet approved or cleared by the [place] FDA and has been authorized for detection and/or diagnosis of SARS-CoV-2 by FDA under an Emergency Use Authorization (EUA). This EUA will remain in effect (meaning this test  can be used) for the duration of the COVID-19 declaration under Section 564(b)(1) of the Act, 21 U.S.C. section 360bbb-3(b)(1), unless the authorization is terminated or revoked.  Performed at [facility], [address], [place], [place] [address]   Blood Culture (routine x 2)  Status: Abnormal   Collection Time: 07/25/22  8:15 AM   Specimen: BLOOD  Result Value Ref Range Status   Specimen Description BLOOD SITE NOT SPECIFIED  Final   Special Requests   Final    BOTTLES DRAWN AEROBIC AND ANAEROBIC Blood Culture results may not be optimal due to an inadequate volume of blood received in culture bottles   Culture  Setup Time   Final    GRAM POSITIVE COCCI IN BOTH AEROBIC AND ANAEROBIC BOTTLES GRAM VARIABLE ROD CRITICAL RESULT CALLED TO, READ BACK BY AND VERIFIED WITH: PHARMD Merlene Morse [id number] @ 2221 FH    Culture (A)  Final    PROTEUS VULGARIS STREPTOCOCCUS DYSGALACTIAE SUSCEPTIBILITIES PERFORMED ON PREVIOUS CULTURE WITHIN THE LAST 5 DAYS. Performed at [facility], [address], [place], [place] [address]    Report Status 07/29/2022 FINAL  Final   Organism ID, Bacteria STREPTOCOCCUS DYSGALACTIAE  Final      Susceptibility   Streptococcus dysgalactiae - MIC*    PENICILLIN <=0.06 SENSITIVE Sensitive     CEFTRIAXONE <=0.12 SENSITIVE Sensitive     ERYTHROMYCIN <=0.12 SENSITIVE Sensitive     LEVOFLOXACIN 0.5 SENSITIVE Sensitive     VANCOMYCIN 0.5 SENSITIVE Sensitive     * STREPTOCOCCUS DYSGALACTIAE  Blood Culture (routine x 2)     Status: Abnormal   Collection Time: 07/25/22  8:21 AM   Specimen: BLOOD  Result Value Ref Range Status   Specimen Description BLOOD SITE NOT SPECIFIED  Final   Special Requests   Final    BOTTLES DRAWN AEROBIC AND ANAEROBIC Blood Culture results may not be optimal due to an inadequate volume of blood received in culture bottles   Culture  Setup Time   Final    GRAM POSITIVE COCCI IN CHAINS GRAM POSITIVE RODS ANAEROBIC BOTTLE  ONLY CRITICAL RESULT CALLED TO, READ BACK BY AND VERIFIED WITH: PHARMD CAREN AMEND ON 07/25/22 @ 1934 BY DRT CRITICAL RESULT CALLED TO, READ BACK BY AND VERIFIED WITH: PHARMD Merlene Morse [id number] @ 2221 FH GRAM POSITIVE COCCI IN CHAINS GRAM NEGATIVE RODS BOTTLES DRAWN AEROBIC ONLY    Culture (A)  Final    PROTEUS VULGARIS STREPTOCOCCUS DYSGALACTIAE SUSCEPTIBILITIES PERFORMED ON PREVIOUS CULTURE WITHIN THE LAST 5 DAYS. GRAM POSITIVE RODS FROM GRAN STAIN UNABLE TO ISOLATE FROM CULTURE CRITICAL RESULT CALLED TO, READ BACK BY AND VERIFIED WITH: Royce Macadamia [id number] AT 1037 BY CM Performed at [facility], [address] [place], [place] [address]    Report Status 07/29/2022 FINAL  Final   Organism ID, Bacteria PROTEUS VULGARIS  Final      Susceptibility   Proteus vulgaris - MIC*    AMPICILLIN >=32 RESISTANT Resistant     CEFEPIME <=0.12 SENSITIVE Sensitive     CEFTAZIDIME <=1 SENSITIVE Sensitive     CIPROFLOXACIN <=0.25 SENSITIVE Sensitive     GENTAMICIN <=1 SENSITIVE Sensitive     IMIPENEM 4 SENSITIVE Sensitive     TRIMETH/SULFA <=20 SENSITIVE Sensitive     AMPICILLIN/SULBACTAM 16 INTERMEDIATE Intermediate     PIP/TAZO <=4 SENSITIVE Sensitive     * PROTEUS VULGARIS  Urine Culture     Status: Abnormal   Collection Time: 07/25/22  8:21 AM   Specimen: Urine, Random  Result Value Ref Range Status   Specimen Description URINE, RANDOM  Final   Special Requests   Final    NONE Reflexed from [id number] Performed at [facility], [address] [place], [place] [address]  Culture MULTIPLE SPECIES PRESENT, SUGGEST RECOLLECTION (A)  Final   Report Status 07/27/2022 FINAL  Final  Blood Culture ID Panel (Reflexed)     Status: Abnormal   Collection Time: 07/25/22  8:21 AM  Result Value Ref Range Status   Enterococcus faecalis NOT DETECTED NOT DETECTED Final   Enterococcus Faecium NOT DETECTED NOT DETECTED Final   Listeria monocytogenes NOT DETECTED NOT DETECTED Final    Staphylococcus species NOT DETECTED NOT DETECTED Final   Staphylococcus aureus (BCID) NOT DETECTED NOT DETECTED Final   Staphylococcus epidermidis NOT DETECTED NOT DETECTED Final   Staphylococcus lugdunensis NOT DETECTED NOT DETECTED Final   Streptococcus species DETECTED (A) NOT DETECTED Final    Comment: Not Enterococcus species, Streptococcus agalactiae, Streptococcus pyogenes, or Streptococcus pneumoniae. CRITICAL RESULT CALLED TO, READ BACK BY AND VERIFIED WITH: Mercer Pod [id number] @ 2221 FH    Streptococcus agalactiae NOT DETECTED NOT DETECTED Final   Streptococcus pneumoniae NOT DETECTED NOT DETECTED Final   Streptococcus pyogenes NOT DETECTED NOT DETECTED Final   A.calcoaceticus-baumannii NOT DETECTED NOT DETECTED Final   Bacteroides fragilis NOT DETECTED NOT DETECTED Final   Enterobacterales DETECTED (A) NOT DETECTED Final    Comment: Enterobacterales represent a large order of gram negative bacteria, not a single organism. CRITICAL RESULT CALLED TO, READ BACK BY AND VERIFIED WITH: Mercer Pod [id number] @ 2221 FH    Enterobacter cloacae complex NOT DETECTED NOT DETECTED Final   Escherichia coli NOT DETECTED NOT DETECTED Final   Klebsiella aerogenes NOT DETECTED NOT DETECTED Final   Klebsiella oxytoca NOT DETECTED NOT DETECTED Final   Klebsiella pneumoniae NOT DETECTED NOT DETECTED Final   Proteus species DETECTED (A) NOT DETECTED Final    Comment: CRITICAL RESULT CALLED TO, READ BACK BY AND VERIFIED WITH: Mercer Pod [id number] @ 2221 FH    Salmonella species NOT DETECTED NOT DETECTED Final   Serratia marcescens NOT DETECTED NOT DETECTED Final   Haemophilus influenzae NOT DETECTED NOT DETECTED Final   Neisseria meningitidis NOT DETECTED NOT DETECTED Final   Pseudomonas aeruginosa NOT DETECTED NOT DETECTED Final   Stenotrophomonas maltophilia NOT DETECTED NOT DETECTED Final   Candida albicans NOT DETECTED NOT DETECTED Final   Candida auris NOT DETECTED NOT DETECTED Final    Candida glabrata NOT DETECTED NOT DETECTED Final   Candida krusei NOT DETECTED NOT DETECTED Final   Candida parapsilosis NOT DETECTED NOT DETECTED Final   Candida tropicalis NOT DETECTED NOT DETECTED Final   Cryptococcus neoformans/gattii NOT DETECTED NOT DETECTED Final   CTX-M ESBL NOT DETECTED NOT DETECTED Final   Carbapenem resistance IMP NOT DETECTED NOT DETECTED Final   Carbapenem resistance KPC NOT DETECTED NOT DETECTED Final   Carbapenem resistance NDM NOT DETECTED NOT DETECTED Final   Carbapenem resist OXA 48 LIKE NOT DETECTED NOT DETECTED Final   Carbapenem resistance VIM NOT DETECTED NOT DETECTED Final    Comment: Performed at [facility], [address], [place], [place] [address]  Culture, blood (Routine X 2) w Reflex to ID Panel     Status: None (Preliminary result)   Collection Time: 07/26/22  5:55 PM   Specimen: BLOOD  Result Value Ref Range Status   Specimen Description BLOOD LEFT ANTECUBITAL  Final   Special Requests   Final    BOTTLES DRAWN AEROBIC AND ANAEROBIC Blood Culture adequate volume   Culture   Final    NO GROWTH 3 DAYS Performed at [facility], [address], [place], [place] [address]  Report Status PENDING  Incomplete  Culture, blood (Routine X 2) w Reflex to ID Panel     Status: None (Preliminary result)   Collection Time: 07/26/22  6:05 PM   Specimen: BLOOD LEFT HAND  Result Value Ref Range Status   Specimen Description BLOOD LEFT HAND  Final   Special Requests   Final    BOTTLES DRAWN AEROBIC AND ANAEROBIC Blood Culture adequate volume   Culture   Final    NO GROWTH 3 DAYS Performed at [facility], [address] [place], [place] [address]    Report Status PENDING  Incomplete  SARS Coronavirus 2 by RT PCR (hospital order, performed in [facility] hospital lab) *cepheid single result test* Anterior Nasal Swab     Status: None   Collection Time: 07/29/22  8:55 AM   Specimen: Anterior Nasal Swab  Result Value Ref Range  Status   SARS Coronavirus 2 by RT PCR NEGATIVE NEGATIVE Final    Comment: Performed at [facility], [address], [place], [place] [address]    Imaging: I reviewed the patient's CT scan from July 5 independently and discussed the findings with the patient on today's visit.  It is unclear whether that subcutaneous air within the perineum and base of the penis or an open area that has skin overlying it.  Imp: Unfortunate 52 year old spina bifida paraplegic with chronic sacral decubitus ulcers, bilateral hip osteomyelitis, and a chronic indwelling Foley with large bladder stones who presented with multi bacterial bacteremia and sepsis.  He has had persistent fever, but that seems to be improving as he has been afebrile over the last 24 hours.  I do not see any evidence of cellulitis or fluctuance in the patient's perineum or inguinal region.  The scrotum and perineum seem to be uninvolved in the infection.  Further, the perirectal wound appears to be healing relatively well.  Recommendations: From a urologic perspective this patient needs no soft tissue debriding or aggressive I&D.   Would recommend that the suprapubic tube be changed if it has been within the last 3 to 4 weeks since last change.  Will continue with aggressive wound care and IV antibiotics.  Any further care I will defer to the primary care team.  Thank you for involving me in this patient's care, Please page with any further questions or concerns. Crist Fat

## 2022-07-29 NOTE — Progress Notes (Signed)
    [facility] has been requested to perform a transesophageal echocardiogram on 7/10 for Bacteremia.  After careful review of history and examination, the risks and benefits of transesophageal echocardiogram have been explained including risks of esophageal damage, perforation (1:10,000 risk), bleeding, pharyngeal hematoma as well as other potential complications associated with conscious sedation including aspiration, arrhythmia, respiratory failure and death. Alternatives to treatment were discussed, questions were answered. Patient is willing to proceed.   Laverda Page, NP-C 07/29/2022 5:01 PM

## 2022-07-29 NOTE — Assessment & Plan Note (Signed)
RESOLVED. AM BMP showed resolution with K+ 3.7 - continue daily BMP 

## 2022-07-29 NOTE — Progress Notes (Signed)
Physical Therapy Treatment Patient Details Name: Anthony [name] MRN: [id number] DOB: 08/05/1970 Today's Date: 07/29/2022   History of Present Illness Pt is a 52 y.o. male who presented 07/25/22 with weakness and a fall. Pt admitted with severe sepsis secondary to sacral decubitus ulcer infection with concern for hip septic arthritis noted on CT. Imaging of R shoulder showed moderate acromioclavicular OA & mild distal lasteral subacromial spurring. PMH: complete paraplegia, multiple toe amputations, chronic sacral wounds, neurogenic bladder with suprapubic catheter, spina bifida, HTN    PT Comments  Pt received in supine and agreeable to session. Pt able to improve bed mobility requiring mod A +2, however pt demonstrating improved ability to manage BLE. Pt continues to be limited by R shoulder pain and weakness, but is able to scoot to EOB and begin lateral transfer to w/c. Pt reports that sitting up and moving R shoulder helps improve the pain. Pt noted to be having a BM during transfer and needed to return to supine. Pt requests time to finish and is left in longsitting per pt request, RN notified.  Pt encouraged to have nursing assist with sitting up and bed mobility as able. Pt continues to benefit from PT services to progress toward functional mobility goals.     Assistance Recommended at Discharge Intermittent Supervision/Assistance  If plan is discharge home, recommend the following:  Can travel by private vehicle    Two people to help with walking and/or transfers;A lot of help with bathing/dressing/bathroom;Assistance with cooking/housework;Assist for transportation;Help with stairs or ramp for entrance      Equipment Recommendations  Hospital bed    Recommendations for Other Services       Precautions / Restrictions Precautions Precautions: Fall;Other (comment) Precaution Comments: paraplegia Restrictions Weight Bearing Restrictions: No     Mobility  Bed Mobility Overal bed  mobility: Needs Assistance Bed Mobility: Supine to Sit, Sit to Supine     Supine to sit: Mod assist, +2 for physical assistance Sit to supine: Min assist   General bed mobility comments: Mod A +2 to come into longsitting and advance BLE over EOB. Pt able to elevate RLE back to EOB, but required min A to elevate LLE.    Transfers Overall transfer level: Needs assistance Equipment used: None Transfers: Bed to chair/wheelchair/BSC            Lateral/Scoot Transfers: Min assist General transfer comment: Began lateral transfer from bed > w/c with pt able to manage BLE and instruct therapist where to place the w/c. Pt scooted halfway to w/c, however was noted to be having a BM and needed to return to supine.        Balance Overall balance assessment: Needs assistance Sitting-balance support: Single extremity supported, Feet unsupported Sitting balance-Leahy Scale: Fair Sitting balance - Comments: sitting EOB                                    Cognition Arousal/Alertness: Awake/alert Behavior During Therapy: WFL for tasks assessed/performed Overall Cognitive Status: Within Functional Limits for tasks assessed                                          Exercises      General Comments        Pertinent Vitals/Pain Pain Assessment Pain Assessment: Faces Faces Pain Scale:  Hurts little more Pain Location: R shoulder Pain Descriptors / Indicators: Discomfort, Grimacing, Guarding Pain Intervention(s): Monitored during session, Limited activity within patient's tolerance     PT Goals (current goals can now be found in the care plan section) Acute Rehab PT Goals Patient Stated Goal: to improve and go home PT Goal Formulation: With patient Time For Goal Achievement: 08/09/22 Potential to Achieve Goals: Good Progress towards PT goals: Progressing toward goals    Frequency    Min 1X/week      PT Plan Current plan remains appropriate        AM-PAC PT "6 Clicks" Mobility   Outcome Measure  Help needed turning from your back to your side while in a flat bed without using bedrails?: Total Help needed moving from lying on your back to sitting on the side of a flat bed without using bedrails?: Total Help needed moving to and from a bed to a chair (including a wheelchair)?: A Lot Help needed standing up from a chair using your arms (e.g., wheelchair or bedside chair)?: Total Help needed to walk in hospital room?: Total Help needed climbing 3-5 steps with a railing? : Total 6 Click Score: 7    End of Session   Activity Tolerance: Other (comment) (limited by BM) Patient left: in bed;with call bell/phone within reach Nurse Communication: Mobility status (Pt having BM) PT Visit Diagnosis: Muscle weakness (generalized) (M62.81);History of falling (Z91.81);Pain Pain - Right/Left: Right Pain - part of body: Shoulder     Time: 1610-9604 PT Time Calculation (min) (ACUTE ONLY): 25 min  Charges:    $Therapeutic Activity: 23-37 mins PT General Charges $$ ACUTE PT VISIT: 1 Visit                     [name], PTA Acute Rehabilitation Services Secure Chat Preferred  [phone]    [name] 07/29/2022, 3:43 PM

## 2022-07-29 NOTE — Assessment & Plan Note (Addendum)
Patient has maintained blood pressures. Patient continues to fever periodically and complaints of chills and generalized weakness. Echo 7/6 without evidence of endocarditis. Patient fevered to 100.7 at 0700 and received 400 mg ibuprofen, This is the second time he has fevered with chills and feeling unwell. Infectious disease is on board and has seen patient again- they are recommending urology see patient as the infection has infiltrated base of penis.  - Daptomycin (7/5 - ) - IV Cefepime (7/5 - ) - Flagyl (7/5 - ) - Appreciate ID's assistance in antibiotic management   - BCID: strep spp + proteus, awaiting culture  - Hold home BP meds - ortho to see pt  - Consult urology per ID recs

## 2022-07-29 NOTE — Progress Notes (Signed)
Daily Progress Note Intern Pager: [phone]  Patient name: Anthony [name] Medical record number: [id number] Date of birth: 02-18-1970 Age: 53 y.o. Gender: male  Primary Care Provider: Erick Alley, DO Consultants: ID, Urology, Ortho Code Status: Full  Pt Overview and Major Events to Date:  7/5 admitted 7/6 Ortho consulted, stated no acute intervention indicated. ID consulted, recommended BSAbx 7/7: Hg dropped and patient received 1x pRBCs  7/8: received another unit of pRBC, [name] ordered per ID recs   Assessment and Plan: Patient is a 52 yo M with PMHx including spina bifida with paraplegia with chronic sacral ulcers who was admitted for septic shock with blood cultures positive for strep species, Enterobacterales, and proteus. Likely source of infection is decubitus ulcers as supported by CT scan showing gas and fluid in soft tissue. Patient has been receiving broad spectrum antibiotics with ID following who recommended consulting urology as infection includes base of patients penis. Ortho has been consulted re surgical debridement and we are waiting for recs. There has been a discussion with family regarding goals of care and power of attorney.   Hospital Problem List      Hospital     * (Principal) Gluteal abscess     Right shoulder pain     XR negative for fracture. Patient reports his shoulder is feeling  better today.  - Roxabin changed from prn to scheduled as patient states this helps him  the post. Oxycodone prn for breakthrough/severe pain - PT/OT - Has Voltaren, topical lidocaine ordered        Decubitus ulcer due to spina bifida [facility])     Patient cleans and packs his own wounds regularly.  - reached back out to Dr. [name] regarding surgical intervention, awaiting  recs  - ID would like to consult urology regarding wound as it extends to base  of penis - wound care consulted, appreciate recs          Hypokalemia     RESOLVED. AM BMP showed resolution with K+  3.7 - continue daily BMP        Septic shock (HCC)     Patient has maintained blood pressures. Patient continues to fever  periodically and complaints of chills and generalized weakness. Echo 7/6  without evidence of endocarditis. Patient fevered to 100.7 at 0700 and  received 400 mg ibuprofen, This is the second time he has fevered with  chills and feeling unwell. Infectious disease is on board and has seen  patient again- they are recommending urology see patient as the infection  has infiltrated base of penis.  - Daptomycin (7/5 - ) - IV Cefepime (7/5 - ) - Flagyl (7/5 - ) - Appreciate ID's assistance in antibiotic management   - BCID: strep spp + proteus, awaiting culture  - Hold home BP meds - ortho to see pt  - Consult urology per ID recs        RESOLVED: Asymptomatic bacteriuria     No dysuria. UA positive for leukocytes and nitrites, seems to be  patient's normal urine. He has a foley catheter that he replaces himself  monthly. Last time it was changed was a week ago. This is likely 2/2  chronic colonization and most likely NOT contributing to his septic  picture. - Urine culture pending        Anemia     Has received 2 units pRBCS during this admission. There is a questions  whether the decrease in Hgb is d/t  dilution vs anemia 2/2 to chronic  disease.  - transfusion threshold dependant on patient hemodynamic status, consider  <6 - decreased fluids to 1/2 maintenance  - repeat CBC daily         Polymicrobial bacterial infection     Chronic osteomyelitis involving pelvic region and thigh (HCC)     Acute bacterial endocarditis     Sepsis (HCC)     Chronic ulcer of sacral region (HCC)     Cough    FEN/GI: regular diet PPx: none for pt as dropped hgb, reconsider Dispo:pending clinical improvement     . Barriers include infectious control and stable status.   Subjective:  Patient seen at bedside this AM. Patient shivering and states he is not doing well. He  reports he is feeling weak and has chills. He also states his shoulder pain is a bit better but would like to have scheduled muscle relaxer as it helps. Patient states no other concerns at this time   Objective: Temp:  [98.1 F (36.7 C)-100.7 F (38.2 C)] 98.1 F (36.7 C) (07/09 1644) Pulse Rate:  [98-112] 99 (07/09 1644) Resp:  [15-26] 17 (07/09 1644) BP: (111-159)/(67-92) 121/78 (07/09 1644) SpO2:  [94 %-100 %] 99 % (07/09 1644) Physical Exam: General: ill appearing and pale, shivering Cardiovascular: tachycardic, no M/R/G Respiratory: mild expiratory wheezing at apical R lung Abdomen: nondistended, soft, normal bowel sounds  Laboratory: Most recent CBC Lab Results  Component Value Date   WBC 14.0 (H) 07/29/2022   HGB 8.1 (L) 07/29/2022   HCT 27.0 (L) 07/29/2022   MCV 69.8 (L) 07/29/2022   PLT 281 07/29/2022   Most recent BMP    Latest Ref Rng & Units 07/29/2022    1:26 AM  BMP  Glucose 70 - 99 mg/dL 161   BUN 6 - 20 mg/dL 7   Creatinine 0.96 - 0.45 mg/dL 4.09   Sodium 811 - 914 mmol/L 141   Potassium 3.5 - 5.1 mmol/L 3.6   Chloride 98 - 111 mmol/L 106   CO2 22 - 32 mmol/L 26   Calcium 8.9 - 10.3 mg/dL 7.7      Imaging/Diagnostic Tests: Chest xray showed no consolidation with some shifting of mediastinum which may be d/t rotation.  Georg Ruddle, Hadlei Stitt Georg Ruddle, MD 07/29/2022, 5:13 PM  PGY-1, [facility] Family [facility] FPTS Intern pager: [phone], text pages welcome Secure chat group [facility] Teaching Service

## 2022-07-29 NOTE — Progress Notes (Signed)
FMTS Interim Progress Note Had a discussion with mother regarding goals of care and disposition. Patient expressed he would like to continue living at home and is okay with home health. Discussed that given progress of infection, he may require more intervention. Mom believes patient may not be best suited for home and may require rehab facility. Patient is okay with rehab for a short period of time but not for long term care. He states he would like his mother to be power of attorney and is interested in completing paperwork for this. Consulted spiritual care for power of attorney discussion.   Georg Ruddle, Gina Leblond Georg Ruddle, MD 07/29/2022, 5:17 PM PGY-1, [facility] Family Medicine Service pager [phone]

## 2022-07-29 NOTE — Care Management Important Message (Signed)
Important Message  Patient Details  Name: Anthony [name] MRN: [id number] Date of Birth: Aug 09, 1970   Medicare Important Message Given:  Yes     Renie Ora 07/29/2022, 8:32 AM

## 2022-07-29 NOTE — Assessment & Plan Note (Signed)
Patient cleans and packs his own wounds regularly.  - reached back out to Dr. [name] regarding surgical intervention, awaiting recs  - ID would like to consult urology regarding wound as it extends to base of penis - wound care consulted, appreciate recs

## 2022-07-29 NOTE — Assessment & Plan Note (Signed)
XR negative for fracture. Patient reports his shoulder is feeling better today.  - Roxabin changed from prn to scheduled as patient states this helps him the post. Oxycodone prn for breakthrough/severe pain - PT/OT - Has Voltaren, topical lidocaine ordered

## 2022-07-29 NOTE — Plan of Care (Signed)
  Problem: Health Behavior/Discharge Planning: Goal: Ability to manage health-related needs will improve Outcome: Not Progressing   

## 2022-07-29 NOTE — Progress Notes (Signed)
Pharmacy Antibiotic Note  Anthony [name] is a 52 y.o. male admitted on 07/25/2022 with bacteremia from chronic osteo / decubitus ulcer infection.  Pharmacy has been consulted for Daptomycin, Cefepime, and Flagyl dosing.  Today is abx day # 5.  Pt continues to spike fevers despite scheduled APAP, Tm 100.7 (last 24h).  Patient is paraplegic at baseline with baseline SCr 0.6.   Plan: Pt tolerating antibiotics at this time.  No changes needed.  Will follow ID, ortho recs.  CK due 7/12 for Daptomycin monitoring.   Height: 5\' 8"  (172.7 cm) Weight: 79.4 kg (175 lb) IBW/kg (Calculated) : 68.4  Temp (24hrs), Avg:99.4 F (37.4 C), Min:98.3 F (36.8 C), Max:100.7 F (38.2 C)  Recent Labs  Lab 07/25/22 0821 07/25/22 0829 07/25/22 1249 07/25/22 1618 07/26/22 0720 07/26/22 0904 07/27/22 0141 07/28/22 0137 07/29/22 0126  WBC 16.7*  --   --   --  9.2 10.3 16.5*  --  14.0*  CREATININE 1.38*   < >  --  1.02 0.68  --  0.74 0.58* 0.50*  LATICACIDVEN 2.1*  --  1.9  --   --   --   --   --   --    < > = values in this interval not displayed.    Estimated Creatinine Clearance: 104.5 mL/min (A) (by C-G formula based on SCr of 0.5 mg/dL (L)).    Allergies  Allergen Reactions   Firvanq [Vancomycin] Itching    Antimicrobials this admission: Cefepime 7/5 >>  Daptomycin 7/5 >> Flagyl 7/5 >>   Dose adjustments this admission:   Microbiology results: 7/6 BCx x 2: ngtd 7/5 BCx x2: Proteus vulgaris (R: Amp, S: Cefepime, Ceftaz, Cipro, Bactrim).  Strep Dysgalactiae 7/5 BCID: Strep sp + Proteus sp  Thank you for allowing pharmacy to be a part of this patient's care.  Brandon Melnick 07/29/2022 2:00 PM

## 2022-07-29 NOTE — Progress Notes (Signed)
Subjective:  Remains intermittent febrile with high leukocytosis Left sided part of ulcer draining a lot still Awaiting ortho's input  Patient wants to get tee to r/o relapse of endocarditis in setting prior endocarditis and e faecalis bacteremia community acquired No n/v/diarrhea    Antibiotics:  Anti-infectives (From admission, onward)    Start     Dose/Rate Route Frequency Ordered Stop   07/28/22 2200  metroNIDAZOLE (FLAGYL) tablet 500 mg        500 mg Oral Every 12 hours 07/28/22 1412     07/26/22 1800  ceFEPIme (MAXIPIME) 2 g in sodium chloride 0.9 % 100 mL IVPB        2 g 200 mL/hr over 30 Minutes Intravenous Every 8 hours 07/26/22 1745     07/25/22 2100  ceFEPIme (MAXIPIME) 2 g in sodium chloride 0.9 % 100 mL IVPB  Status:  Discontinued        2 g 200 mL/hr over 30 Minutes Intravenous Every 12 hours 07/25/22 0926 07/26/22 1745   07/25/22 2100  metroNIDAZOLE (FLAGYL) IVPB 500 mg  Status:  Discontinued        500 mg 100 mL/hr over 60 Minutes Intravenous Every 12 hours 07/25/22 1900 07/28/22 1412   07/25/22 1700  DAPTOmycin (CUBICIN) 650 mg in sodium chloride 0.9 % IVPB        8 mg/kg  79.4 kg 126 mL/hr over 30 Minutes Intravenous Daily 07/25/22 1546     07/25/22 0800  ceFEPIme (MAXIPIME) 2 g in sodium chloride 0.9 % 100 mL IVPB        2 g 200 mL/hr over 30 Minutes Intravenous  Once 07/25/22 0758 07/25/22 0942   07/25/22 0800  metroNIDAZOLE (FLAGYL) IVPB 500 mg        500 mg 100 mL/hr over 60 Minutes Intravenous  Once 07/25/22 0758 07/25/22 1159       Medications: Scheduled Meds:  sodium chloride   Intravenous Once   sodium chloride   Intravenous Once   acetaminophen  500 mg Oral Q6H   feeding supplement  237 mL Oral TID BM   flecainide  50 mg Oral BID   lidocaine  1-2 patch Transdermal Q24H   loratadine  10 mg Oral Daily   methocarbamol  1,000 mg Oral Q8H   metroNIDAZOLE  500 mg Oral Q12H   multivitamin with minerals  1 tablet Oral Daily   sodium  hypochlorite   Irrigation Daily   zinc sulfate  220 mg Oral Daily   Continuous Infusions:  ceFEPime (MAXIPIME) IV 2 g (07/29/22 0849)   DAPTOmycin (CUBICIN) 650 mg in sodium chloride 0.9 % IVPB 650 mg (07/29/22 1407)   lactated ringers 75 mL/hr at 07/29/22 1156   PRN Meds:.diclofenac Sodium, oxyCODONE **OR** oxyCODONE, polyethylene glycol, prochlorperazine    Objective: Weight change:   Intake/Output Summary (Last 24 hours) at 07/29/2022 1416 Last data filed at 07/29/2022 1035 Gross per 24 hour  Intake 4394 ml  Output 2000 ml  Net 2394 ml    Blood pressure 133/72, pulse 100, temperature 98.8 F (37.1 C), temperature source Oral, resp. rate (!) 26, height 5\' 8"  (1.727 m), weight 79.4 kg, SpO2 95 %. Temp:  [98.3 F (36.8 C)-100.7 F (38.2 C)] 98.8 F (37.1 C) (07/09 1154) Pulse Rate:  [94-112] 100 (07/09 1154) Resp:  [15-26] 26 (07/09 1154) BP: (111-159)/(69-92) 133/72 (07/09 1154) SpO2:  [95 %-100 %] 95 % (07/09 1154)  Physical Exam: Physical Exam HENT:  Head: Normocephalic and atraumatic.  Cardiovascular:     Rate and Rhythm: Tachycardia present.  Pulmonary:     Effort: Pulmonary effort is normal. No respiratory distress.     Breath sounds: No wheezing.  Abdominal:     General: There is no distension.  Skin:    General: Skin is warm and dry.     Coloration: Skin is pale.  Neurological:     Mental Status: He is alert and oriented to person, place, and time.     Comments: paraplegic  Psychiatric:        Mood and Affect: Mood normal.        Behavior: Behavior normal.        Thought Content: Thought content normal.        Judgment: Judgment normal.     Wounds dressed and clean appearing  CBC: Lab Results  Component Value Date   WBC 14.0 (H) 07/29/2022   HGB 8.1 (L) 07/29/2022   HCT 27.0 (L) 07/29/2022   MCV 69.8 (L) 07/29/2022   PLT 281 07/29/2022      BMET Recent Labs    07/28/22 0137 07/29/22 0126  NA 134* 141  K 3.4* 3.6  CL 101 106  CO2  24 26  GLUCOSE 91 127*  BUN 7 7  CREATININE 0.58* 0.50*  CALCIUM 7.5* 7.7*      Liver Panel  No results for input(s): "PROT", "ALBUMIN", "AST", "ALT", "ALKPHOS", "BILITOT", "BILIDIR", "IBILI" in the last 72 hours.      Sedimentation Rate No results for input(s): "ESRSEDRATE" in the last 72 hours. C-Reactive Protein No results for input(s): "CRP" in the last 72 hours.  Micro Results: Recent Results (from the past 720 hour(s))  Resp panel by RT-PCR (RSV, Flu A&B, Covid) Anterior Nasal Swab     Status: None   Collection Time: 07/25/22  7:58 AM   Specimen: Anterior Nasal Swab  Result Value Ref Range Status   SARS Coronavirus 2 by RT PCR NEGATIVE NEGATIVE Final   Influenza A by PCR NEGATIVE NEGATIVE Final   Influenza B by PCR NEGATIVE NEGATIVE Final    Comment: (NOTE) The Xpert Xpress SARS-CoV-2/FLU/RSV plus assay is intended as an aid in the diagnosis of influenza from Nasopharyngeal swab specimens and should not be used as a sole basis for treatment. Nasal washings and aspirates are unacceptable for Xpert Xpress SARS-CoV-2/FLU/RSV testing.  Fact Sheet for Patients: BloggerCourse.com  Fact Sheet for Healthcare Providers: SeriousBroker.it  This test is not yet approved or cleared by the [place] FDA and has been authorized for detection and/or diagnosis of SARS-CoV-2 by FDA under an Emergency Use Authorization (EUA). This EUA will remain in effect (meaning this test can be used) for the duration of the COVID-19 declaration under Section 564(b)(1) of the Act, 21 U.S.C. section 360bbb-3(b)(1), unless the authorization is terminated or revoked.     Resp Syncytial Virus by PCR NEGATIVE NEGATIVE Final    Comment: (NOTE) Fact Sheet for Patients: BloggerCourse.com  Fact Sheet for Healthcare Providers: SeriousBroker.it  This test is not yet approved or cleared by the  [place] FDA and has been authorized for detection and/or diagnosis of SARS-CoV-2 by FDA under an Emergency Use Authorization (EUA). This EUA will remain in effect (meaning this test can be used) for the duration of the COVID-19 declaration under Section 564(b)(1) of the Act, 21 U.S.C. section 360bbb-3(b)(1), unless the authorization is terminated or revoked.  Performed at [facility], [address] [place],   [address]   Blood Culture (routine x 2)     Status: Abnormal   Collection Time: 07/25/22  8:15 AM   Specimen: BLOOD  Result Value Ref Range Status   Specimen Description BLOOD SITE NOT SPECIFIED  Final   Special Requests   Final    BOTTLES DRAWN AEROBIC AND ANAEROBIC Blood Culture results may not be optimal due to an inadequate volume of blood received in culture bottles   Culture  Setup Time   Final    GRAM POSITIVE COCCI IN BOTH AEROBIC AND ANAEROBIC BOTTLES GRAM VARIABLE ROD CRITICAL RESULT CALLED TO, READ BACK BY AND VERIFIED WITH: PHARMD Merlene Morse [id number] @ 2221 FH    Culture (A)  Final    PROTEUS VULGARIS STREPTOCOCCUS DYSGALACTIAE SUSCEPTIBILITIES PERFORMED ON PREVIOUS CULTURE WITHIN THE LAST 5 DAYS. Performed at [facility], [address], [place], [place] [address]    Report Status 07/29/2022 FINAL  Final   Organism ID, Bacteria STREPTOCOCCUS DYSGALACTIAE  Final      Susceptibility   Streptococcus dysgalactiae - MIC*    PENICILLIN <=0.06 SENSITIVE Sensitive     CEFTRIAXONE <=0.12 SENSITIVE Sensitive     ERYTHROMYCIN <=0.12 SENSITIVE Sensitive     LEVOFLOXACIN 0.5 SENSITIVE Sensitive     VANCOMYCIN 0.5 SENSITIVE Sensitive     * STREPTOCOCCUS DYSGALACTIAE  Blood Culture (routine x 2)     Status: Abnormal   Collection Time: 07/25/22  8:21 AM   Specimen: BLOOD  Result Value Ref Range Status   Specimen Description BLOOD SITE NOT SPECIFIED  Final   Special Requests   Final    BOTTLES DRAWN AEROBIC AND ANAEROBIC Blood Culture results may  not be optimal due to an inadequate volume of blood received in culture bottles   Culture  Setup Time   Final    GRAM POSITIVE COCCI IN CHAINS GRAM POSITIVE RODS ANAEROBIC BOTTLE ONLY CRITICAL RESULT CALLED TO, READ BACK BY AND VERIFIED WITH: PHARMD CAREN AMEND ON 07/25/22 @ 1934 BY DRT CRITICAL RESULT CALLED TO, READ BACK BY AND VERIFIED WITH: PHARMD Merlene Morse [id number] @ 2221 FH GRAM POSITIVE COCCI IN CHAINS GRAM NEGATIVE RODS BOTTLES DRAWN AEROBIC ONLY    Culture (A)  Final    PROTEUS VULGARIS STREPTOCOCCUS DYSGALACTIAE SUSCEPTIBILITIES PERFORMED ON PREVIOUS CULTURE WITHIN THE LAST 5 DAYS. GRAM POSITIVE RODS FROM GRAN STAIN UNABLE TO ISOLATE FROM CULTURE CRITICAL RESULT CALLED TO, READ BACK BY AND VERIFIED WITH: Royce Macadamia [id number] AT 1037 BY CM Performed at [facility], [address] [place], [place] [address]    Report Status 07/29/2022 FINAL  Final   Organism ID, Bacteria PROTEUS VULGARIS  Final      Susceptibility   Proteus vulgaris - MIC*    AMPICILLIN >=32 RESISTANT Resistant     CEFEPIME <=0.12 SENSITIVE Sensitive     CEFTAZIDIME <=1 SENSITIVE Sensitive     CIPROFLOXACIN <=0.25 SENSITIVE Sensitive     GENTAMICIN <=1 SENSITIVE Sensitive     IMIPENEM 4 SENSITIVE Sensitive     TRIMETH/SULFA <=20 SENSITIVE Sensitive     AMPICILLIN/SULBACTAM 16 INTERMEDIATE Intermediate     PIP/TAZO <=4 SENSITIVE Sensitive     * PROTEUS VULGARIS  Urine Culture     Status: Abnormal   Collection Time: 07/25/22  8:21 AM   Specimen: Urine, Random  Result Value Ref Range Status   Specimen Description URINE, RANDOM  Final   Special Requests   Final    NONE Reflexed from [id number] Performed  at [facility], [address] [place], [place] [address]    Culture MULTIPLE SPECIES PRESENT, SUGGEST RECOLLECTION (A)  Final   Report Status 07/27/2022 FINAL  Final  Blood Culture ID Panel (Reflexed)     Status: Abnormal   Collection Time: 07/25/22  8:21 AM  Result Value Ref Range Status    Enterococcus faecalis NOT DETECTED NOT DETECTED Final   Enterococcus Faecium NOT DETECTED NOT DETECTED Final   Listeria monocytogenes NOT DETECTED NOT DETECTED Final   Staphylococcus species NOT DETECTED NOT DETECTED Final   Staphylococcus aureus (BCID) NOT DETECTED NOT DETECTED Final   Staphylococcus epidermidis NOT DETECTED NOT DETECTED Final   Staphylococcus lugdunensis NOT DETECTED NOT DETECTED Final   Streptococcus species DETECTED (A) NOT DETECTED Final    Comment: Not Enterococcus species, Streptococcus agalactiae, Streptococcus pyogenes, or Streptococcus pneumoniae. CRITICAL RESULT CALLED TO, READ BACK BY AND VERIFIED WITH: Mercer Pod [id number] @ 2221 FH    Streptococcus agalactiae NOT DETECTED NOT DETECTED Final   Streptococcus pneumoniae NOT DETECTED NOT DETECTED Final   Streptococcus pyogenes NOT DETECTED NOT DETECTED Final   A.calcoaceticus-baumannii NOT DETECTED NOT DETECTED Final   Bacteroides fragilis NOT DETECTED NOT DETECTED Final   Enterobacterales DETECTED (A) NOT DETECTED Final    Comment: Enterobacterales represent a large order of gram negative bacteria, not a single organism. CRITICAL RESULT CALLED TO, READ BACK BY AND VERIFIED WITH: Mercer Pod [id number] @ 2221 FH    Enterobacter cloacae complex NOT DETECTED NOT DETECTED Final   Escherichia coli NOT DETECTED NOT DETECTED Final   Klebsiella aerogenes NOT DETECTED NOT DETECTED Final   Klebsiella oxytoca NOT DETECTED NOT DETECTED Final   Klebsiella pneumoniae NOT DETECTED NOT DETECTED Final   Proteus species DETECTED (A) NOT DETECTED Final    Comment: CRITICAL RESULT CALLED TO, READ BACK BY AND VERIFIED WITH: Mercer Pod [id number] @ 2221 FH    Salmonella species NOT DETECTED NOT DETECTED Final   Serratia marcescens NOT DETECTED NOT DETECTED Final   Haemophilus influenzae NOT DETECTED NOT DETECTED Final   Neisseria meningitidis NOT DETECTED NOT DETECTED Final   Pseudomonas aeruginosa NOT DETECTED NOT  DETECTED Final   Stenotrophomonas maltophilia NOT DETECTED NOT DETECTED Final   Candida albicans NOT DETECTED NOT DETECTED Final   Candida auris NOT DETECTED NOT DETECTED Final   Candida glabrata NOT DETECTED NOT DETECTED Final   Candida krusei NOT DETECTED NOT DETECTED Final   Candida parapsilosis NOT DETECTED NOT DETECTED Final   Candida tropicalis NOT DETECTED NOT DETECTED Final   Cryptococcus neoformans/gattii NOT DETECTED NOT DETECTED Final   CTX-M ESBL NOT DETECTED NOT DETECTED Final   Carbapenem resistance IMP NOT DETECTED NOT DETECTED Final   Carbapenem resistance KPC NOT DETECTED NOT DETECTED Final   Carbapenem resistance NDM NOT DETECTED NOT DETECTED Final   Carbapenem resist OXA 48 LIKE NOT DETECTED NOT DETECTED Final   Carbapenem resistance VIM NOT DETECTED NOT DETECTED Final    Comment: Performed at [facility], [address], [place], [place] [address]  Culture, blood (Routine X 2) w Reflex to ID Panel     Status: None (Preliminary result)   Collection Time: 07/26/22  5:55 PM   Specimen: BLOOD  Result Value Ref Range Status   Specimen Description BLOOD LEFT ANTECUBITAL  Final   Special Requests   Final    BOTTLES DRAWN AEROBIC AND ANAEROBIC Blood Culture adequate volume   Culture   Final    NO GROWTH  3 DAYS Performed at [facility], [address] [place], [place] [address]    Report Status PENDING  Incomplete  Culture, blood (Routine X 2) w Reflex to ID Panel     Status: None (Preliminary result)   Collection Time: 07/26/22  6:05 PM   Specimen: BLOOD LEFT HAND  Result Value Ref Range Status   Specimen Description BLOOD LEFT HAND  Final   Special Requests   Final    BOTTLES DRAWN AEROBIC AND ANAEROBIC Blood Culture adequate volume   Culture   Final    NO GROWTH 3 DAYS Performed at [facility], [address], [place], [place] [address]    Report Status PENDING  Incomplete  SARS Coronavirus 2 by RT PCR (hospital order, performed in [facility] hospital lab) *cepheid single result test* Anterior Nasal Swab     Status: None   Collection Time: 07/29/22  8:55 AM   Specimen: Anterior Nasal Swab  Result Value Ref Range Status   SARS Coronavirus 2 by RT PCR NEGATIVE NEGATIVE Final    Comment: Performed at [facility], [address], [place], [place] [address]    Studies/Results: DG CHEST PORT 1 VIEW  Result Date: 07/29/2022 CLINICAL DATA:  Tachypnea EXAM: PORTABLE CHEST 1 VIEW COMPARISON:  Previous studies including the examination of 07/25/2022 FINDINGS: Transverse diameter of heart is increased. Apparent shift of mediastinum to the right may be due to rotation. Low lung volumes. There are no signs of pulmonary edema or focal pulmonary consolidation. Costophrenic angles are clear. There is no pneumothorax. IMPRESSION: There are no signs of pulmonary edema or focal pulmonary consolidation. Electronically Signed   By: Ernie Avena M.D.   On: 07/29/2022 13:47    7/6 tte  1. No evidence of SBE or obvious vegetations.   2. Left ventricular ejection fraction, by estimation, is 55%. The left ventricle has normal function. The left ventricle has no regional wall motion abnormalities. Left ventricular diastolic parameters were normal.   3. Right ventricular systolic function is normal. The right ventricular size is normal.   4. Left atrial size was mildly dilated.   5. The mitral valve is abnormal. Trivial mitral valve regurgitation. No evidence of mitral stenosis.   6. The aortic valve is tricuspid. There is mild calcification of the aortic valve. Aortic valve regurgitation is not visualized. Aortic valve sclerosis is present, with no evidence of aortic valve stenosis.   7. The inferior vena cava is normal in size with greater than 50% respiratory variability, suggesting right atrial pressure of 3 mmHg.   7/5 ct abd pelv 1. Bilateral decubitus ulceration overlying the posterior aspect of both hips and bilateral ischium. There  is gas and fluid noted in the soft tissues surrounding the hips as well as within the hip joints bilaterally. Findings are concerning for septic arthritis. 2. Soft tissue ulceration, subcutaneous gas and soft tissue thickening along the left gluteal fold towards the base of penis. 3. Signs of chronic osteomyelitis with increased sclerosis and bony destruction within bilateral pubic rami. 4. Splenomegaly. 5. Three large calcifications within the lumen of the urinary bladder which measure up to 3.6 cm. Unchanged from previous exam. 6. Stable retroperitoneal and bilateral inguinal adenopathy. 7. Malrotation variant of the bowel. No evidence of bowel obstruction. 8. Fat containing right inguinal hernia.  Assessment/Plan:  Abx: 7/05-c Cefepime 7/05-c Daptomycin 7/05-c metronidazole  Principal Problem:   Gluteal abscess Active Problems:   Right shoulder pain  Decubitus ulcer due to spina bifida (HCC)   Hypokalemia   Septic shock (HCC)   Anemia   Polymicrobial bacterial infection   Chronic osteomyelitis involving pelvic region and thigh (HCC)   Acute bacterial endocarditis   Sepsis (HCC)    KRISTAN VOTTA is a 52 y.o. male with spina bifida paraplegia chronic decubitus ulcers, chronic left septic arthritis osteomyelitis status post multiple I&D's by orthopedic surgery, multiple cultures including bone cultures growing Enterococcus faecalis and Bacteroides bacteremia with streptococcus anginosus, mitral valve endocarditis presumed to be due to Enterococcus status post ceftriaxone and high-dose ampicillin, readmitted 7/05 for sepsis in setting worsening sacra wound with bone/joint involvement both femoral joint processes, and complicated by proteus and e faecalis bacteremia and also GPR on stain    #1 Polymicrobial bacteremia with sepsis: Continue cefepime daptomycin Flagyl The gram positive rod is not able to be grown due to over growth by proteus. Regardless should be covered by  current abx regimen  Tte no obvious new vegetation  Given prior endocarditis history, worry he could have relapse. I offer treat short 2 weeks and surveillance vs 6 weeks empirically vs doing tee and see. Patient opted for the tee   #2 bilateral septic arthritis and chronic pelvic osteomyelitis #3 chronic sacral ulcer decub  Await dr [name] evaluation Ongoing sepsis with negative repeat bcx suspect due to poor source control sacral ulcer/soft tissue infection  Imaging concern for potenially soft tissue involvement tracking to scrotum and would probably need urology evaluation as well    #4 hx of endocarditis: Transthoracic echocardiogram shows thickening of the mitral valve  See tee request as above (to be done this week by cards)       I spent more than 50 minute reviewing data/chart, and coordinating care, providing direct face to face time providing counseling/discussing diagnostics/treatment plan with patient and treatment team    LOS: 3 days   Krystina Strieter T Orvel Cutsforth 07/29/2022, 2:16 PM

## 2022-07-29 NOTE — Assessment & Plan Note (Signed)
Has received 2 units pRBCS during this admission. There is a questions whether the decrease in Hgb is d/t dilution vs anemia 2/2 to chronic disease.  - transfusion threshold dependant on patient hemodynamic status, consider <6 - decreased fluids to 1/2 maintenance  - repeat CBC daily

## 2022-07-30 ENCOUNTER — Inpatient Hospital Stay (HOSPITAL_COMMUNITY): Payer: Medicare HMO

## 2022-07-30 ENCOUNTER — Encounter (HOSPITAL_COMMUNITY)
Admission: EM | Disposition: A | Discharge status: Skilled Nursing Facility | Payer: Self-pay | Source: Home / Self Care | Attending: Family Medicine

## 2022-07-30 ENCOUNTER — Other Ambulatory Visit (HOSPITAL_COMMUNITY): Payer: Medicare HMO

## 2022-07-30 ENCOUNTER — Encounter (HOSPITAL_COMMUNITY): Payer: Self-pay | Admitting: Family Medicine

## 2022-07-30 DIAGNOSIS — Q059 Spina bifida, unspecified: Secondary | ICD-10-CM | POA: Diagnosis not present

## 2022-07-30 DIAGNOSIS — M86652 Other chronic osteomyelitis, left thigh: Secondary | ICD-10-CM

## 2022-07-30 DIAGNOSIS — D649 Anemia, unspecified: Secondary | ICD-10-CM | POA: Diagnosis not present

## 2022-07-30 DIAGNOSIS — L0231 Cutaneous abscess of buttock: Secondary | ICD-10-CM

## 2022-07-30 DIAGNOSIS — L8990 Pressure ulcer of unspecified site, unspecified stage: Secondary | ICD-10-CM

## 2022-07-30 DIAGNOSIS — R0600 Dyspnea, unspecified: Secondary | ICD-10-CM | POA: Diagnosis not present

## 2022-07-30 DIAGNOSIS — A419 Sepsis, unspecified organism: Secondary | ICD-10-CM | POA: Diagnosis not present

## 2022-07-30 LAB — BASIC METABOLIC PANEL
Anion gap: 6 (ref 5–15)
BUN: 7 mg/dL (ref 6–20)
CO2: 25 mmol/L (ref 22–32)
Calcium: 7.4 mg/dL — ABNORMAL LOW (ref 8.9–10.3)
Chloride: 109 mmol/L (ref 98–111)
Creatinine, Ser: 0.5 mg/dL — ABNORMAL LOW (ref 0.61–1.24)
GFR, Estimated: >60 mL/min (ref >60)
Glucose, Bld: 103 mg/dL — ABNORMAL HIGH (ref 70–99)
Potassium: 3.9 mmol/L (ref 3.5–5.1)
Sodium: 140 mmol/L (ref 135–145)

## 2022-07-30 LAB — CBC
HCT: 25.6 % — ABNORMAL LOW (ref 39.0–52.0)
Hemoglobin: 7.5 g/dL — ABNORMAL LOW (ref 13.0–17.0)
MCH: 21.6 pg — ABNORMAL LOW (ref 26.0–34.0)
MCHC: 29.3 g/dL — ABNORMAL LOW (ref 30.0–36.0)
MCV: 73.8 fL — ABNORMAL LOW (ref 80.0–100.0)
Platelets: 303 10*3/uL (ref 150–400)
RBC: 3.47 MIL/uL — ABNORMAL LOW (ref 4.22–5.81)
RDW: 22.9 % — ABNORMAL HIGH (ref 11.5–15.5)
WBC: 15 10*3/uL — ABNORMAL HIGH (ref 4.0–10.5)
nRBC: 0.1 % (ref 0.0–0.2)

## 2022-07-30 LAB — TYPE AND SCREEN
ABO/RH(D): O POS
Donor AG Type: NEGATIVE
Donor AG Type: NEGATIVE
Unit division: 0

## 2022-07-30 LAB — BPAM RBC: Blood Product Expiration Date: 202408082359

## 2022-07-30 LAB — CULTURE, BLOOD (ROUTINE X 2): Culture: NO GROWTH

## 2022-07-30 SURGERY — INVASIVE LAB ABORTED CASE

## 2022-07-30 MED ORDER — OXYCODONE HCL 5 MG PO TABS
5.0000 mg | ORAL_TABLET | Freq: Four times a day (QID) | ORAL | Status: DC | PRN
  Administered 2022-07-30 – 2022-08-02 (×11): 5 mg via ORAL
  Filled 2022-07-30 (×11): qty 1

## 2022-07-30 MED ORDER — IBUPROFEN 400 MG PO TABS
600.0000 mg | ORAL_TABLET | ORAL | Status: DC | PRN
  Administered 2022-07-30 – 2022-08-04 (×12): 600 mg via ORAL
  Filled 2022-07-30 (×12): qty 2

## 2022-07-30 MED ORDER — SODIUM CHLORIDE 0.9 % IV SOLN: INTRAVENOUS | Status: DC

## 2022-07-30 NOTE — Progress Notes (Signed)
Inpatient Rehabilitation Admissions Coordinator   I met with patient and his Mom at bedside. They prefer SNF at [facility]. I have notified TOC. We will sign off.  Ottie Glazier, RN, MSN Rehab Admissions Coordinator [phone] 07/30/2022 2:54 PM

## 2022-07-30 NOTE — Progress Notes (Signed)
FMTS Interim Progress Note  *Delayed entry*  S:Went to the bedside to see Anthony [name] with Dr. Mliss Sax.  Resting comfortably, did not awaken him  O: BP 133/69   Pulse (!) 102   Temp 98.6 F (37 C) (Oral)   Resp 20   Ht 5\' 8"  (1.727 m)   Wt 79.4 kg   SpO2 94%   BMI 26.61 kg/m   General: Resting in the bed, no distress  A/P: Sepsis likely secondary to chronic osteomyelitis in the pelvis and bilateral decubitus ulceration, with concern for septic arthritis Reassuringly, patient has remained afebrile throughout the shift thus far.  He is normotensive, and heart rate has remained in the 90s. -Continues to remain on broad-spectrum antibiotics (IV cefepime, daptomycin, metronidazole) -Continue IV fluids at 75 mill/hour, but cautious with concern for developing volume overload -RN to page if any concerns.  Will monitor vital signs closely throughout the night  Darral Dash, DO 07/30/2022, 7:28 PM PGY-3, [facility] Service pager [phone]

## 2022-07-30 NOTE — Progress Notes (Signed)
Chaplain responded to a consult request for Advance Directive education. Chaplain met with [name] and his mother at his bedside. Pt consented to receiving more information about advance directives. Chaplain utilized reflective listening as pt and his mother began to explore these decisions. Pt's mother asked what he was thinking about it. She said that he had stated the other day that he "wanted to live." Chaplain acknowledged how these decisions become more vulnerable and timely when one is a patient in the hospital. For some people, the timeliness means that these documents may be important in their care in the next couple of days and for others, they may benefit from some time to recover and make the decisions from a less vulnerable space. Chaplain normalized varying feelings and reinforced how individual the decision is. Chaplains remain available to facilitate conversations about decisions or to assist with completion if pt desires.   For further review, AD education included:  Chaplain provided the Advance Directive packet as well as education on Advance Directives-documents an individual completes to communicate their health care directions in advance of a time when they may need them. Chaplain informed pt the documents which may be completed here in the hospital are the Living Will and Health Care Power of [place].   Chaplain informed that the Health Care Power of [place] is a legal document in which an individual names another person, their Health Care Agent, to make health care decisions when the individual is not able to make them for themselves. The Health Care Agent's function can be temporary or permanent depending on the pt's ability to make and communicate those decisions independently. Chaplain informed pt in the absence of a Health Care Power of [place], the state of [place] directs health care providers to look to the following individuals in the order listed: legal guardian;  an attorney?in?fact under a general power of attorney (POA) if that POA includes the right to make health care decisions; a husband or wife; a majority of parents and adult children; a majority of adult brothers and sisters; or an individual who has an established relationship with you, who is acting in good faith and who can convey your wishes.  If none of these person are available or willing to make medical decisions on a patient's behalf, the law allows the patient's doctor to make decisions for them as long as another doctor agrees with those decisions.  Chaplain also informed the patient that the Health Care agent has no decision-making authority over any affairs other than those related to his or her medical care.   The chaplain further educated the pt that a Living Will is a legal document that allows an individual to state his or her desire not to receive life-prolonging measures in the event that they have a condition that is incurable and will result in their death in a short period of time; they are unconscious, and doctors are confident that they will not regain consciousness; and/or they have advanced dementia or other substantial and irreversible loss of mental function. The chaplain informed pt that life-prolonging measures are medical treatments that would only serve to postpone death, including breathing machines, kidney dialysis, antibiotics, artificial nutrition and hydration (tube feeding), and similar forms of treatment and that if an individual is able to express their wishes, they may also make them known without the use of a Living Will, but in the event that an individual is not able to express their wishes themselves, a Living Will allows  medical providers and the pt's family and friends ensure that they are not making decisions on the pt's behalf, but rather serving as the pt's voice to convey decisions the pt has already made.   The patient is aware that the decision to create an  advance directive is theirs alone and they may chose not to complete the documents or may chose to complete one portion or both.  The patient was informed that they can revoke the documents at any time by striking through them and writing void or by completing new documents, but that it is also advisable that the individual verbally notify interested parties that their wishes have changed.  They are also aware that the document must be signed in the presence of a notary public and two witnesses and that this can be done while the patient is still admitted to the hospital or after discharge in the community. If they decide to complete Advance Directives after being discharged from the hospital, they have been advised to notify all interested parties and to provide those documents to their physicians and loved ones in addition to bringing them to the hospital in the event of another hospitalization.   The chaplain informed the pt that if they desire to proceed with completing Advance Directive Documentation while they are still admitted, notary services are typically available at [facility] between the hours of 1:00 and 3:30 Monday-Thursday.    When the patient is ready to have these documents completed, the patient should request that their nurse place a spiritual care consult and indicate that the patient is ready to have their advance directives notarized so that arrangements for witnesses and notary public can be made.  Please page spiritual care if the patient desires further education or has questions.       Maryanna Shape. Carley Hammed, M.Div. [facility] Chaplain Pager [phone] Office [phone]     07/30/22 1455  Spiritual Encounters  Type of Visit Initial  Care provided to: Pt and family  Referral source Nurse (RN/NT/LPN)  Reason for visit Advance directives  OnCall Visit Yes  Spiritual Framework  Presenting Themes Goals in life/care;Significant life change  Interventions   Spiritual Care Interventions Made Established relationship of care and support;Compassionate presence;Normalization of emotions;Reflective listening  Advance Directives (For Healthcare)  Does Patient Have a Medical Advance Directive? No  Would patient like information on creating a medical advance directive? Yes (Inpatient - patient defers creating a medical advance directive at this time - Information given)

## 2022-07-30 NOTE — Consult Note (Signed)
ORTHOPAEDIC CONSULTATION  REQUESTING PHYSICIAN: Carney Living, MD  Chief Complaint: Sinus draining ulcer from previous Girdlestone amputation left hip.  HPI: Anthony [name] is a 52 y.o. male who presents with history of spina bifida with sacral decubitus ulcers and perineal ulceration.  Patient is also status post left hip Girdlestone amputation with a chronic sinus draining tract.  Past Medical History:  Diagnosis Date   Anxiety    Chronic indwelling Foley catheter    Complication of anesthesia    woken up in surgery before   Dysrhythmia    Endocarditis of mitral valve    Hypertension    Osteomyelitis (HCC)    left hip   Paraplegia (HCC)    secondary to Spina Bifida   PVCs (premature ventricular contractions)    Septic shock (HCC)    due to osteomyelitis   Spina bifida    Wheelchair bound    Past Surgical History:  Procedure Laterality Date   APPLICATION OF WOUND VAC  09/11/2021   Procedure: APPLICATION OF WOUND VAC;  Surgeon: [name], MD;  Location: [facility] OR;  Service: Orthopedics;;   BACK SURGERY     BUBBLE STUDY  09/09/2021   Procedure: BUBBLE STUDY;  Surgeon: Quintella Reichert, MD;  Location: [facility] ENDOSCOPY;  Service: Cardiovascular;;   HIP SURGERY     I & D EXTREMITY Left 09/11/2021   Procedure: LEFT HIP DEBRIDEMENT AND REMOVAL FEMORAL HEAD;  Surgeon: [name], MD;  Location: [facility] OR;  Service: Orthopedics;  Laterality: Left;   I & D EXTREMITY Left 09/13/2021   Procedure: DEBRIDEMENT LEFT HIP;  Surgeon: [name], MD;  Location[facility] OR;  Service: Orthopedics;  Laterality: Left;   I & D EXTREMITY Left 09/18/2021   Procedure: DEBRIDEMENT LEFT HIP, WOUND VAC EXCHANGE;  Surgeon: [name], MD;  Location: [facility] OR;  Service: Orthopedics;  Laterality: Left;   I & D EXTREMITY Left 10/30/2021   Procedure: LEFT HIP DEBRIDEMENT WITH KERECIS PLACEMENT;  Surgeon: [name], MD;  Location: [facility] OR;  Service: Orthopedics;  Laterality: Left;   INCISION AND  DRAINAGE OF WOUND N/A 01/24/2017   Procedure: IRRIGATION AND DEBRIDEMENT WOUND- BUTTOCK ABSCESS;  Surgeon: Almond Lint, MD;  Location: [facility] ORS;  Service: General;  Laterality: N/A;   [name] WITHOUT CARDIOVERSION N/A 09/09/2021   Procedure: TRANSESOPHAGEAL ECHOCARDIOGRAM ([name]);  Surgeon: Quintella Reichert, MD;  Location: [facility] ENDOSCOPY;  Service: Cardiovascular;  Laterality: N/A;   Social History   Socioeconomic History   Marital status: Single    Spouse name: Not on file   Number of children: Not on file   Years of education: Not on file   Highest education level: Not on file  Occupational History   Not on file  Tobacco Use   Smoking status: Former    Types: Cigarettes   Smokeless tobacco: Never  Vaping Use   Vaping Use: Never used  Substance and Sexual Activity   Alcohol use: No   Drug use: Not on file   Sexual activity: Not on file  Other Topics Concern   Not on file  Social History Narrative   Not on file   Social Determinants of Health   Financial Resource Strain: Medium Risk (01/23/2022)   Overall Financial Resource Strain (CARDIA)    Difficulty of Paying Living Expenses: Somewhat hard  Food Insecurity: No Food Insecurity (07/26/2022)   Hunger Vital Sign    Worried About Running Out of Food in the Last  Year: Never true    Ran Out of Food in the Last Year: Never true  Transportation Needs: No Transportation Needs (07/26/2022)   PRAPARE - Administrator, Civil Service (Medical): No    Lack of Transportation (Non-Medical): No  Physical Activity: Inactive (01/23/2022)   Exercise Vital Sign    Days of Exercise per Week: 0 days    Minutes of Exercise per Session: 0 min  Stress: Stress Concern Present (01/23/2022)   [employer] of Occupational Health - Occupational Stress Questionnaire    Feeling of Stress : Rather much  Social Connections: Socially Isolated (01/23/2022)   Social Connection and Isolation Panel [NHANES]    Frequency of Communication with Friends and  Family: Three times a week    Frequency of Social Gatherings with Friends and Family: Once a week    Attends Religious Services: Never    Database administrator or Organizations: No    Attends Engineer, structural: Never    Marital Status: Never married   Family History  Problem Relation Age of Onset   CAD Father        died age 55 from MI   Sudden Cardiac Death Sister        age 21   CAD Maternal Grandfather        died age 32 MI   - negative except otherwise stated in the family history section Allergies  Allergen Reactions   Firvanq [Vancomycin] Itching   Prior to Admission medications   Medication Sig Start Date End Date Taking? Authorizing Provider  acetaminophen (TYLENOL) 500 MG tablet Take 1,000 mg by mouth daily as needed for moderate pain, headache or fever.   Yes [provider]  flecainide (TAMBOCOR) 50 MG tablet Take 1 tablet (50 mg total) by mouth 2 (two) times daily. 05/20/21  Yes Camnitz, Andree Coss, MD  ibuprofen (ADVIL) 200 MG tablet Take 200 mg by mouth every 6 (six) hours as needed for headache or moderate pain.   Yes [provider]  lisinopril-hydrochlorothiazide (ZESTORETIC) 20-25 MG tablet Take 1 tablet by mouth daily. 02/17/22  Yes Asa Lente, Tessa N, PA-C  metoprolol succinate (TOPROL-XL) 50 MG 24 hr tablet Take 1 tablet (50 mg total) by mouth 2 (two) times daily. Take with or immediately following a meal. 01/10/22  Yes Conte, Tessa N, PA-C  Multiple Vitamin (MULTIVITAMIN WITH MINERALS) TABS tablet Take 1 tablet by mouth daily.   Yes [provider]  nitroGLYCERIN (NITROSTAT) 0.4 MG SL tablet Place 1 tablet (0.4 mg total) under the tongue every 5 (five) minutes as needed for chest pain. 01/10/22  Yes Conte, Tessa N, PA-C  Omega-3 Fatty Acids (FISH OIL PO) Take 1 capsule by mouth daily.   Yes [provider]  POTASSIUM CHLORIDE PO Take 1 tablet by mouth daily. OTC potassium supplement   Yes [provider]   rosuvastatin (CRESTOR) 40 MG tablet Take 1 tablet by mouth once daily Patient taking differently: Take 40 mg by mouth every evening. 05/01/22  Yes Quintella Reichert, MD  Catheters (FOLEY CATHETER 2-WAY) MISC Use as directed by urologist. 12/30/12   Leona Singleton, MD   DG CHEST PORT 1 VIEW  Result Date: 07/29/2022 CLINICAL DATA:  Tachypnea EXAM: PORTABLE CHEST 1 VIEW COMPARISON:  Previous studies including the examination of 07/25/2022 FINDINGS: Transverse diameter of heart is increased. Apparent shift of mediastinum to the right may be due to rotation. Low lung volumes. There are no signs of pulmonary  edema or focal pulmonary consolidation. Costophrenic angles are clear. There is no pneumothorax. IMPRESSION: There are no signs of pulmonary edema or focal pulmonary consolidation. Electronically Signed   By: Ernie Avena M.D.   On: 07/29/2022 13:47   - pertinent xrays, CT, MRI studies were reviewed and independently interpreted  Positive ROS: All other systems have been reviewed and were otherwise negative with the exception of those mentioned in the HPI and as above.  Physical Exam: General: Alert, no acute distress Psychiatric: Patient is competent for consent with normal mood and affect Lymphatic: No axillary or cervical lymphadenopathy Cardiovascular: No pedal edema Respiratory: No cyanosis, no use of accessory musculature GI: No organomegaly, abdomen is soft and non-tender    Images:  @ENCIMAGES @  Labs:  Lab Results  Component Value Date   REPTSTATUS PENDING 07/26/2022   GRAMSTAIN  10/30/2021    RARE WBC PRESENT,BOTH PMN AND MONONUCLEAR NO ORGANISMS SEEN    CULT  07/26/2022    NO GROWTH 4 DAYS Performed at [facility], [address] [place], [place] [address]    LABORGA PROTEUS VULGARIS 07/25/2022    Lab Results  Component Value Date   ALBUMIN <1.5 (L) 07/26/2022   ALBUMIN 1.6 (L) 07/25/2022   ALBUMIN <1.5 (L) 09/07/2021        Latest Ref Rng  & Units 07/30/2022   12:10 AM 07/29/2022    9:50 AM 07/29/2022    1:26 AM  CBC EXTENDED  WBC 4.0 - 10.5 K/uL 15.0   14.0   RBC 4.22 - 5.81 MIL/uL 3.47   3.24   Hemoglobin 13.0 - 17.0 g/dL 7.5  8.1  6.6   HCT 60.4 - 52.0 % 25.6  27.0  22.6   Platelets 150 - 400 K/uL 303   281     Neurologic: Patient does not have protective sensation bilateral lower extremities.   MUSCULOSKELETAL:   Skin: Examination the skin around the left hip has no cellulitis no maceration.  There is a 1 cm in diameter sinus draining tract that is deep.  Packing was removed and there is stool within the packing.  White cell count 15.0 with a hemoglobin of 7.5.  Albumin less than 1.5.  Absolute neutrophil to lymphocyte count approximately 30.  Assessment: Chronic sacral and perineal ulceration status post Girdlestone amputation left hip with chronic draining tract with stool within the packing.  Plan: Recommend more frequent packing dressing changes left hip wound.  I can follow-up in the office and see if we can obtain authorization for [facility] tissue graft in the office.  This should facilitate the wound healing.  Thank you for the consult and the opportunity to see Mr. Gerhart Ruggieri, MD [facility] [phone] 7:15 AM

## 2022-07-30 NOTE — Plan of Care (Signed)
IR was requested for image guided aspiration.   CT A/P with from 07/25/22 was reviewed by Dr. Deanne Coffer, there is no target that can be aspirated for sample.    Ordering provider notified.   Will delete the IR rad eval order.  Please call IR for questions and concerns.   Lynann Bologna Calla Wedekind PA-C 07/30/2022 2:11 PM

## 2022-07-30 NOTE — Progress Notes (Signed)
Occupational Therapy Treatment Patient Details Name: Anthony [name] MRN: [id number] DOB: 1970/11/03 Today's Date: 07/30/2022   History of present illness Pt is a 52 y.o. male who presented 07/25/22 with weakness and a fall. Pt admitted with severe sepsis secondary to sacral decubitus ulcer infection with concern for hip septic arthritis noted on CT. Imaging of R shoulder showed moderate acromioclavicular OA & mild distal lasteral subacromial spurring. PMH: complete paraplegia, multiple toe amputations, chronic sacral wounds, neurogenic bladder with suprapubic catheter, spina bifida, HTN   OT comments  Pt not feeling well, noted fever earlier today, but agreeable to shoulder exercises, retrograde massage and elevation of R UE and repositioning in bed. Mom and MD at bedside at end of session. Updated d/c plan. Patient will benefit from continued inpatient follow up therapy, <3 hours/day.   Recommendations for follow up therapy are one component of a multi-disciplinary discharge planning process, led by the attending physician.  Recommendations may be updated based on patient status, additional functional criteria and insurance authorization.    Assistance Recommended at Discharge Frequent or constant Supervision/Assistance  Patient can return home with the following  Two people to help with walking and/or transfers;A lot of help with bathing/dressing/bathroom;Assistance with cooking/housework;Assistance with feeding;Direct supervision/assist for medications management;Assist for transportation;Help with stairs or ramp for entrance   Equipment Recommendations  Hospital bed    Recommendations for Other Services      Precautions / Restrictions Precautions Precautions: Fall;Other (comment) Precaution Comments: paraplegia Restrictions Weight Bearing Restrictions: No       Mobility Bed Mobility   Bed Mobility: Rolling           General bed mobility comments: min to roll for  repositioning    Transfers                   General transfer comment: declined, pt not feeling well     Balance                                           ADL either performed or assessed with clinical judgement   ADL                                              Extremity/Trunk Assessment Upper Extremity Assessment RUE Deficits / Details: performed retrograde massage and elevated above heart, completed AAROM FF within pain tolerance and AROM elbow to hand RUE Coordination: decreased fine motor;decreased gross motor            Vision       Perception     Praxis      Cognition Arousal/Alertness: Awake/alert Behavior During Therapy: WFL for tasks assessed/performed Overall Cognitive Status: Within Functional Limits for tasks assessed                                          Exercises      Shoulder Instructions       General Comments      Pertinent Vitals/ Pain       Pain Assessment Pain Assessment: Faces Faces Pain Scale: Hurts even more Pain Location: R shoulder with FF>90 degrees Pain Descriptors / Indicators:  Grimacing Pain Intervention(s): Monitored during session, Repositioned, Premedicated before session  Home Living Family/patient expects to be discharged to:: Private residence Living Arrangements: Alone                                      Prior Functioning/Environment              Frequency  Min 1X/week        Progress Toward Goals  OT Goals(current goals can now be found in the care plan section)  Progress towards OT goals: Progressing toward goals  Acute Rehab OT Goals OT Goal Formulation: With patient Time For Goal Achievement: 08/09/22 Potential to Achieve Goals: Good  Plan Discharge plan needs to be updated    Co-evaluation                 AM-PAC OT "6 Clicks" Daily Activity     Outcome Measure   Help from another person eating  meals?: A Little Help from another person taking care of personal grooming?: A Little Help from another person toileting, which includes using toliet, bedpan, or urinal?: Total Help from another person bathing (including washing, rinsing, drying)?: A Lot Help from another person to put on and taking off regular upper body clothing?: A Lot Help from another person to put on and taking off regular lower body clothing?: Total 6 Click Score: 12    End of Session    OT Visit Diagnosis: Muscle weakness (generalized) (M62.81)   Activity Tolerance Patient limited by fatigue   Patient Left in bed;with call bell/phone within reach;with family/visitor present   Nurse Communication Other (comment) (asked NT to keep R UE elevated)        Time: 1511-1540 OT Time Calculation (min): 29 min  Charges: OT General Charges $OT Visit: 1 Visit OT Treatments $Therapeutic Activity: 8-22 mins $Therapeutic Exercise: 8-22 mins Berna Spare, OTR/L Acute Rehabilitation Services Office: [phone]   Evern Bio 07/30/2022, 4:30 PM

## 2022-07-30 NOTE — Assessment & Plan Note (Addendum)
Patient has maintained blood pressures. Patient continues to fever periodically and complaints of chills and generalized weakness. Echo 7/6 without evidence of endocarditis. ID on board, who recommended a repeat [name] for re-evaluation of endocarditis. Ortho and urology recommend aggressive wound care but no surgical intervention at this time.  - [name] scheduled for today per ID recs - consulted IR re: drainage/aspiration possibility - exchange suprapubic catheter  - Daptomycin (7/5 - ) - IV Cefepime (7/5 - ) - Flagyl (7/5 - )   - BCID: strep spp + proteus, no grown >2 days  - Hold home BP meds for now, continue to monitor

## 2022-07-30 NOTE — Assessment & Plan Note (Signed)
Patient cleans and packs his own wounds regularly.  - Ortho recommended recommend more frequent packing dressing changes left hip wound with follow-up in the office and see if we can obtain authorization for [facility] tissue graft in the office.Ortho signed off at this time - Urology recommended suprapubic tube be changed if it has been within the last 3 to 4 weeks since last change. Will continue with aggressive wound care and IV antibiotics  - wound care on board, will reach back out regarding management - ID on board

## 2022-07-30 NOTE — Progress Notes (Signed)
FMTS Interim Progress Note  S: Team called to bedside for concern regarding swelling of Right hand. Patient stated R hand was more swollen but not tender or hotter than rest of body.   O: No obvious erythema or warmth to upper R extremity. R arm is more swollen than left. Patient did have IV on that side but site looks clean without obvious thrombophlebitis. Picture attached:    BP 128/74   Pulse (!) 109   Temp 98.4 F (36.9 C) (Oral)   Resp 20   Ht 5\' 8"  (1.727 m)   Wt 79.4 kg   SpO2 96%   BMI 26.61 kg/m     A/P: Likely position edema 2/2 to increase in IVF. Encouraged patient to elevate and apply ice prn.    Cyd Silence, MD 07/30/2022, 4:05 PM PGY-1, [facility] Family Medicine Service pager [phone]

## 2022-07-30 NOTE — Progress Notes (Signed)
Daily Progress Note Intern Pager: [phone]  Patient name: Anthony [name] Medical record number: [id number] Date of birth: [birth year] Age: 52 y.o. Gender: male  Primary Care Provider: Erick Alley, DO Consultants: ID, Urology, Ortho Code Status: Full   Pt Overview and Major Events to Date:  7/5 admitted 7/6 Ortho consulted, stated no acute intervention indicated. ID consulted, recommended BSAbx 7/7: Hg dropped and patient received 1x pRBCs  7/8: received another unit of pRBC, [name] ordered per ID recs  7/9: NTD surgically per ortho and urology   Assessment and Plan:  Patient is a 52 yo M with PMHx including spina bifida with paraplegia with chronic sacral ulcers who was admitted for septic shock with blood cultures positive for strep species, Enterobacterales, and proteus. Likely source of infection is decubitus ulcers as supported by CT scan showing gas and fluid in soft tissue. Patient has been receiving broad spectrum antibiotics with ID following who recommended consulting urology as infection includes base of patients penis. Ortho and urology saw patient and do not see evidence for surgical intervention at this time. Will consult IR to see if possibility of drainage/aspiration.There has been a discussion with family regarding goals of care and power of attorney.   Hospital Problem List      Hospital     * (Principal) Gluteal abscess     Right shoulder pain     XR negative for fracture. Patient reports his shoulder is feeling  better after scheduling roxabin.  - Continued scheduled roxabin q8  - Oxycodone prn for breakthrough/severe pain, will wean as tolerated - PT/OT - Has Voltaren, topical lidocaine ordered        Decubitus ulcer due to spina bifida [facility])     Patient cleans and packs his own wounds regularly.  - Ortho recommended recommend more frequent packing dressing changes left  hip wound with follow-up in the office and see if we can obtain  authorization for  Kerecis tissue graft in the office.Ortho signed off at  this time - Urology recommended suprapubic tube be changed if it has been within the  last 3 to 4 weeks since last change. Will continue with aggressive wound  care and IV antibiotics  - wound care on board, will reach back out regarding management - ID on board          Hypokalemia     RESOLVED. AM BMP showed resolution with K+ 3.7 - continue daily BMP        Septic shock (HCC)     Patient has maintained blood pressures. Patient continues to fever  periodically and complaints of chills and generalized weakness. Echo 7/6  without evidence of endocarditis. ID on board, who recommended a repeat  [name] for re-evaluation of endocarditis. Ortho and urology recommend  aggressive wound care but no surgical intervention at this time.  - [name] scheduled for today per ID recs - consulted IR re: drainage/aspiration possibility - exchange suprapubic catheter  - Daptomycin (7/5 - ) - IV Cefepime (7/5 - ) - Flagyl (7/5 - )   - BCID: strep spp + proteus, no grown >2 days  - Hold home BP meds for now, continue to monitor         RESOLVED: Asymptomatic bacteriuria     No dysuria. UA positive for leukocytes and nitrites, seems to be  patient's normal urine. He has a foley catheter that he replaces himself  monthly. Last time it was changed was a week ago. This  is likely 2/2  chronic colonization and most likely NOT contributing to his septic  picture. - Urine culture pending        Anemia     Has received 2 units pRBCS during this admission. There is a questions  whether the decrease in Hgb is d/t dilution vs anemia 2/2 to chronic  disease. Transfusion threshold dependant on patient hemodynamic status,  consider <6. Decreased fluids to 1/2 maintenance yesteryday (7/9). Hg  stable per AM CBC - repeat CBC daily         Polymicrobial bacterial infection     Chronic osteomyelitis involving pelvic region and thigh (HCC)     Acute  bacterial endocarditis     Sepsis (HCC)     Chronic ulcer of sacral region (HCC)     Cough    FEN/GI: regular diet PPx: none at this time, will recosnider when Hg is stable  Dispo:pending clinical improvement.   Subjective:  Patient reports he is feeling so so. He states his shoulder pain is better. He states he had a conversation with doctors yesterday regarding an ostomy bag as there is stool in his wound. He reports he was hesitant on that prior but is willing to consider it now. He states his cough is much improved. He has no acute concerns at this time.   Objective: Temp:  [98.1 F (36.7 C)-100.1 F (37.8 C)] 98.6 F (37 C) (07/10 0818) Pulse Rate:  [88-113] 113 (07/10 1100) Resp:  [17-28] 18 (07/10 0818) BP: (117-149)/(67-87) 135/72 (07/10 1100) SpO2:  [93 %-100 %] 93 % (07/10 1100) Physical Exam: General: pale, shivering Cardiovascular: tachycardic, no M/R/G Respiratory: mild expiratory wheezing at apical R lung Abdomen: nondistended, soft, normal bowel sounds  Laboratory: Most recent CBC Lab Results  Component Value Date   WBC 15.0 (H) 07/30/2022   HGB 7.5 (L) 07/30/2022   HCT 25.6 (L) 07/30/2022   MCV 73.8 (L) 07/30/2022   PLT 303 07/30/2022   Most recent BMP    Latest Ref Rng & Units 07/30/2022   12:10 AM  BMP  Glucose 70 - 99 mg/dL 161   BUN 6 - 20 mg/dL 7   Creatinine 0.96 - 0.45 mg/dL 4.09   Sodium 811 - 914 mmol/L 140   Potassium 3.5 - 5.1 mmol/L 3.9   Chloride 98 - 111 mmol/L 109   CO2 22 - 32 mmol/L 25   Calcium 8.9 - 10.3 mg/dL 7.4     Other pertinent labs    Imaging/Diagnostic Tests: No new imaging Cyd Silence, MD 07/30/2022, 11:46 AM  PGY-1, [facility] FPTS Intern pager: [phone], text pages welcome Secure chat group [facility] Teaching Service

## 2022-07-30 NOTE — Assessment & Plan Note (Addendum)
XR negative for fracture. Patient reports his shoulder is feeling better after scheduling roxabin.  - Continued scheduled roxabin q8  - Oxycodone prn for breakthrough/severe pain, will wean as tolerated - PT/OT - Has Voltaren, topical lidocaine ordered

## 2022-07-30 NOTE — Progress Notes (Signed)
Per patient Suprapubic catheter exchanged a week ago. MD on call made aware, and stated  they do want the suprapubic catheter still changed. Stating they would call urology in AM, for exchange. Primary RN aware. Blima Jaimes, Randall An RN

## 2022-07-30 NOTE — Assessment & Plan Note (Signed)
Has received 2 units pRBCS during this admission. There is a questions whether the decrease in Hgb is d/t dilution vs anemia 2/2 to chronic disease. Transfusion threshold dependant on patient hemodynamic status, consider <6. Decreased fluids to 1/2 maintenance yesteryday (7/9). Hg stable per AM CBC - repeat CBC daily

## 2022-07-30 NOTE — Progress Notes (Signed)
Pt appeared SOB, pale and wheezing on auscultation. Pt's temp=102.4. MD aware and presented at the bedside. New orders received. Will continue to monitor the pt.   Lawson Radar, RN

## 2022-07-31 ENCOUNTER — Other Ambulatory Visit: Payer: Self-pay

## 2022-07-31 DIAGNOSIS — R609 Edema, unspecified: Secondary | ICD-10-CM | POA: Diagnosis not present

## 2022-07-31 DIAGNOSIS — L89159 Pressure ulcer of sacral region, unspecified stage: Secondary | ICD-10-CM | POA: Diagnosis not present

## 2022-07-31 DIAGNOSIS — L8945 Pressure ulcer of contiguous site of back, buttock and hip, unstageable: Secondary | ICD-10-CM | POA: Diagnosis not present

## 2022-07-31 DIAGNOSIS — D638 Anemia in other chronic diseases classified elsewhere: Secondary | ICD-10-CM

## 2022-07-31 DIAGNOSIS — R6521 Severe sepsis with septic shock: Secondary | ICD-10-CM | POA: Diagnosis not present

## 2022-07-31 DIAGNOSIS — M8638 Chronic multifocal osteomyelitis, other site: Secondary | ICD-10-CM | POA: Diagnosis not present

## 2022-07-31 DIAGNOSIS — B999 Unspecified infectious disease: Secondary | ICD-10-CM

## 2022-07-31 DIAGNOSIS — L8990 Pressure ulcer of unspecified site, unspecified stage: Secondary | ICD-10-CM | POA: Diagnosis not present

## 2022-07-31 DIAGNOSIS — A419 Sepsis, unspecified organism: Secondary | ICD-10-CM | POA: Diagnosis not present

## 2022-07-31 LAB — CBC
HCT: 27.2 % — ABNORMAL LOW (ref 39.0–52.0)
Hemoglobin: 7.8 g/dL — ABNORMAL LOW (ref 13.0–17.0)
MCH: 21.5 pg — ABNORMAL LOW (ref 26.0–34.0)
MCHC: 28.7 g/dL — ABNORMAL LOW (ref 30.0–36.0)
MCV: 74.9 fL — ABNORMAL LOW (ref 80.0–100.0)
Platelets: 332 10*3/uL (ref 150–400)
RBC: 3.63 MIL/uL — ABNORMAL LOW (ref 4.22–5.81)
RDW: 23.9 % — ABNORMAL HIGH (ref 11.5–15.5)
WBC: 12.7 10*3/uL — ABNORMAL HIGH (ref 4.0–10.5)
nRBC: 0 % (ref 0.0–0.2)

## 2022-07-31 LAB — BASIC METABOLIC PANEL
Anion gap: 8 (ref 5–15)
BUN: 11 mg/dL (ref 6–20)
CO2: 23 mmol/L (ref 22–32)
Calcium: 7.5 mg/dL — ABNORMAL LOW (ref 8.9–10.3)
Chloride: 106 mmol/L (ref 98–111)
Creatinine, Ser: 0.66 mg/dL (ref 0.61–1.24)
GFR, Estimated: >60 mL/min (ref >60)
Glucose, Bld: 114 mg/dL — ABNORMAL HIGH (ref 70–99)
Potassium: 4.1 mmol/L (ref 3.5–5.1)
Sodium: 137 mmol/L (ref 135–145)

## 2022-07-31 LAB — CULTURE, BLOOD (ROUTINE X 2)

## 2022-07-31 MED ORDER — ENOXAPARIN SODIUM 40 MG/0.4ML IJ SOSY
40.0000 mg | PREFILLED_SYRINGE | INTRAMUSCULAR | Status: DC
  Administered 2022-07-31 – 2022-08-01 (×2): 40 mg via SUBCUTANEOUS
  Filled 2022-07-31 (×2): qty 0.4

## 2022-07-31 MED ORDER — SODIUM CHLORIDE 0.9 % IV SOLN: INTRAVENOUS | Status: DC

## 2022-07-31 MED ORDER — CHLORHEXIDINE GLUCONATE CLOTH 2 % EX PADS
6.0000 | MEDICATED_PAD | Freq: Every day | CUTANEOUS | Status: DC
  Administered 2022-07-31 – 2022-09-01 (×28): 6 via TOPICAL

## 2022-07-31 MED ORDER — SODIUM CHLORIDE 0.9% FLUSH
10.0000 mL | Freq: Two times a day (BID) | INTRAVENOUS | Status: DC
  Administered 2022-07-31 – 2022-08-06 (×10): 10 mL
  Administered 2022-08-07: 20 mL
  Administered 2022-08-09 – 2022-08-26 (×20): 10 mL
  Administered 2022-08-26: 20 mL
  Administered 2022-08-27: 30 mL
  Administered 2022-08-27 – 2022-09-01 (×9): 10 mL

## 2022-07-31 MED ORDER — SODIUM CHLORIDE 0.9% FLUSH: 10.0000 mL | INTRAVENOUS | Status: DC | PRN

## 2022-07-31 NOTE — Assessment & Plan Note (Addendum)
Patient complaining of cough and congestion for the past day. States he has allergies but that this is more than usual. COVID swab negative. Chest xray was negative for any acute process. Patient was receiving IVF + 2 units RBCs, possibly fluid overloaded. D/C IVF as patient is PO-ing well  - incentive spirometry

## 2022-07-31 NOTE — TOC Progression Note (Addendum)
Transition of Care [facility]) - Progression Note    Patient Details  Name: Anthony [name] MRN: [id number] Date of Birth: Aug 12, 1970  Transition of Care [facility]) CM/SW Contact  Eduard Roux, [place] Phone Number: 07/31/2022, 6:09 PM  Clinical Narrative:     [name] met with patient and his mother- provided them with [facility] contact information and requested they call the business office.   Patient reports he was told medicaid would cover balance owed- patient and family requested to send referral to [facility]. [name] explained,having a balance may still be potential barrier.  [name] sent referral to [facility] as requested   [name], MSW, LCSW Clinical Social Worker         Expected Discharge Plan and Services                                               Social Determinants of Health (SDOH) Interventions SDOH Screenings   Food Insecurity: No Food Insecurity (07/26/2022)  Housing: Low Risk  (07/26/2022)  Transportation Needs: No Transportation Needs (07/26/2022)  Utilities: Not At Risk (07/26/2022)  Alcohol Screen: Low Risk  (01/23/2022)  Depression (PHQ2-9): Low Risk  (02/27/2022)  Financial Resource Strain: Medium Risk (01/23/2022)  Physical Activity: Inactive (01/23/2022)  Social Connections: Socially Isolated (01/23/2022)  Stress: Stress Concern Present (01/23/2022)  Tobacco Use: Medium Risk (07/30/2022)    Readmission Risk Interventions     No data to display

## 2022-07-31 NOTE — Assessment & Plan Note (Signed)
XR negative for fracture. Patient reports his shoulder is feeling better after scheduling roxabin.  - Continue scheduled roxabin q8  - Oxycodone prn for breakthrough/severe pain, will wean as tolerated - PT/OT - Has Voltaren, topical lidocaine ordered

## 2022-07-31 NOTE — Progress Notes (Signed)
Peripherally Inserted Central Catheter Placement  The IV Nurse has discussed with the patient and/or persons authorized to consent for the patient, the purpose of this procedure and the potential benefits and risks involved with this procedure.  The benefits include less needle sticks, lab draws from the catheter, and the patient may be discharged home with the catheter. Risks include, but not limited to, infection, bleeding, blood clot (thrombus formation), and puncture of an artery; nerve damage and irregular heartbeat and possibility to perform a PICC exchange if needed/ordered by physician.  Alternatives to this procedure were also discussed.  Bard Power PICC patient education guide, fact sheet on infection prevention and patient information card has been provided to patient /or left at bedside.    PICC Placement Documentation  PICC Single Lumen 07/31/22 Left Basilic 44 cm 0 cm (Active)  Indication for Insertion or Continuance of Line Home intravenous therapies (PICC only) 07/31/22 1800  Exposed Catheter (cm) 0 cm 07/31/22 1800  Site Assessment Clean, Dry, Intact 07/31/22 1800  Line Status Flushed;Saline locked;Blood return noted 07/31/22 1800  Dressing Type Transparent;Securing device 07/31/22 1800  Dressing Status Antimicrobial disc in place;Clean, Dry, Intact 07/31/22 1800  Safety Lock Not Applicable 07/31/22 1800  Line Care Connections checked and tightened 07/31/22 1800  Line Adjustment (NICU/IV Team Only) No 07/31/22 1800  Dressing Intervention New dressing 07/31/22 1800  Dressing Change Due 08/07/22 07/31/22 1800       Timmothy Sours 07/31/2022, 6:14 PM

## 2022-07-31 NOTE — Progress Notes (Addendum)
Subjective: Reviewed ortho's assessment. No surgical treatment needed Continues to fever intermittently  Awaiting tee Right arm swollen x1day No dyspnea/chest pain  No n/v/diarrhea/rash    Antibiotics:  Anti-infectives (From admission, onward)    Start     Dose/Rate Route Frequency Ordered Stop   07/28/22 2200  metroNIDAZOLE (FLAGYL) tablet 500 mg        500 mg Oral Every 12 hours 07/28/22 1412     07/26/22 1800  ceFEPIme (MAXIPIME) 2 g in sodium chloride 0.9 % 100 mL IVPB        2 g 200 mL/hr over 30 Minutes Intravenous Every 8 hours 07/26/22 1745     07/25/22 2100  ceFEPIme (MAXIPIME) 2 g in sodium chloride 0.9 % 100 mL IVPB  Status:  Discontinued        2 g 200 mL/hr over 30 Minutes Intravenous Every 12 hours 07/25/22 0926 07/26/22 1745   07/25/22 2100  metroNIDAZOLE (FLAGYL) IVPB 500 mg  Status:  Discontinued        500 mg 100 mL/hr over 60 Minutes Intravenous Every 12 hours 07/25/22 1900 07/28/22 1412   07/25/22 1700  DAPTOmycin (CUBICIN) 650 mg in sodium chloride 0.9 % IVPB        8 mg/kg  79.4 kg 126 mL/hr over 30 Minutes Intravenous Daily 07/25/22 1546     07/25/22 0800  ceFEPIme (MAXIPIME) 2 g in sodium chloride 0.9 % 100 mL IVPB        2 g 200 mL/hr over 30 Minutes Intravenous  Once 07/25/22 0758 07/25/22 0942   07/25/22 0800  metroNIDAZOLE (FLAGYL) IVPB 500 mg        500 mg 100 mL/hr over 60 Minutes Intravenous  Once 07/25/22 0758 07/25/22 1159       Medications: Scheduled Meds:  sodium chloride   Intravenous Once   sodium chloride   Intravenous Once   acetaminophen  500 mg Oral Q6H   enoxaparin (LOVENOX) injection  40 mg Subcutaneous Q24H   feeding supplement  237 mL Oral TID BM   flecainide  50 mg Oral BID   lidocaine  1-2 patch Transdermal Q24H   loratadine  10 mg Oral Daily   methocarbamol  1,000 mg Oral Q8H   metroNIDAZOLE  500 mg Oral Q12H   multivitamin with minerals  1 tablet Oral Daily   zinc sulfate  220 mg Oral Daily    Continuous Infusions:  ceFEPime (MAXIPIME) IV 2 g (07/31/22 0959)   DAPTOmycin (CUBICIN) 650 mg in sodium chloride 0.9 % IVPB 650 mg (07/30/22 1418)   PRN Meds:.diclofenac Sodium, ibuprofen, oxyCODONE **OR** [DISCONTINUED] oxyCODONE, polyethylene glycol, prochlorperazine    Objective: Weight change:   Intake/Output Summary (Last 24 hours) at 07/31/2022 1154 Last data filed at 07/31/2022 0830 Gross per 24 hour  Intake 140 ml  Output 2000 ml  Net -1860 ml   Blood pressure 130/72, pulse (!) 106, temperature 99 F (37.2 C), temperature source Oral, resp. rate 20, height 5\' 8"  (1.727 m), weight 79.4 kg, SpO2 96%. Temp:  [98.4 F (36.9 C)-102 F (38.9 C)] 99 F (37.2 C) (07/11 1119) Pulse Rate:  [89-114] 106 (07/11 1119) Resp:  [15-27] 20 (07/11 1119) BP: (108-147)/(61-84) 130/72 (07/11 1119) SpO2:  [94 %-96 %] 96 % (07/11 1119)  Physical Exam: Physical Exam Constitutional:      Appearance: He is not toxic-appearing.  HENT:     Head: Normocephalic.  Cardiovascular:     Rate and Rhythm:  Tachycardia present.  Pulmonary:     Effort: Pulmonary effort is normal.  Abdominal:     Palpations: Abdomen is soft.     Tenderness: There is no abdominal tenderness.  Skin:    General: Skin is warm.     Findings: No rash.  Neurological:     Mental Status: He is alert and oriented to person, place, and time.     Comments: paraplegic     Wounds dressed and clean appearing  CBC: Lab Results  Component Value Date   WBC 12.7 (H) 07/31/2022   HGB 7.8 (L) 07/31/2022   HCT 27.2 (L) 07/31/2022   MCV 74.9 (L) 07/31/2022   PLT 332 07/31/2022      BMET Recent Labs    07/30/22 0010 07/31/22 0241  NA 140 137  K 3.9 4.1  CL 109 106  CO2 25 23  GLUCOSE 103* 114*  BUN 7 11  CREATININE 0.50* 0.66  CALCIUM 7.4* 7.5*     Liver Panel  No results for input(s): "PROT", "ALBUMIN", "AST", "ALT", "ALKPHOS", "BILITOT", "BILIDIR", "IBILI" in the last 72  hours.      Sedimentation Rate No results for input(s): "ESRSEDRATE" in the last 72 hours. C-Reactive Protein No results for input(s): "CRP" in the last 72 hours.  Micro Results: Recent Results (from the past 720 hour(s))  Resp panel by RT-PCR (RSV, Flu A&B, Covid) Anterior Nasal Swab     Status: None   Collection Time: 07/25/22  7:58 AM   Specimen: Anterior Nasal Swab  Result Value Ref Range Status   SARS Coronavirus 2 by RT PCR NEGATIVE NEGATIVE Final   Influenza A by PCR NEGATIVE NEGATIVE Final   Influenza B by PCR NEGATIVE NEGATIVE Final    Comment: (NOTE) The Xpert Xpress SARS-CoV-2/FLU/RSV plus assay is intended as an aid in the diagnosis of influenza from Nasopharyngeal swab specimens and should not be used as a sole basis for treatment. Nasal washings and aspirates are unacceptable for Xpert Xpress SARS-CoV-2/FLU/RSV testing.  Fact Sheet for Patients: BloggerCourse.com  Fact Sheet for Healthcare Providers: SeriousBroker.it  This test is not yet approved or cleared by the [place] FDA and has been authorized for detection and/or diagnosis of SARS-CoV-2 by FDA under an Emergency Use Authorization (EUA). This EUA will remain in effect (meaning this test can be used) for the duration of the COVID-19 declaration under Section 564(b)(1) of the Act, 21 U.S.C. section 360bbb-3(b)(1), unless the authorization is terminated or revoked.     Resp Syncytial Virus by PCR NEGATIVE NEGATIVE Final    Comment: (NOTE) Fact Sheet for Patients: BloggerCourse.com  Fact Sheet for Healthcare Providers: SeriousBroker.it  This test is not yet approved or cleared by the [place] FDA and has been authorized for detection and/or diagnosis of SARS-CoV-2 by FDA under an Emergency Use Authorization (EUA). This EUA will remain in effect (meaning this test can be used) for the  duration of the COVID-19 declaration under Section 564(b)(1) of the Act, 21 U.S.C. section 360bbb-3(b)(1), unless the authorization is terminated or revoked.  Performed at [facility], [address] [place], [place] [address]   Blood Culture (routine x 2)     Status: Abnormal   Collection Time: 07/25/22  8:15 AM   Specimen: BLOOD  Result Value Ref Range Status   Specimen Description BLOOD SITE NOT SPECIFIED  Final   Special Requests   Final    BOTTLES DRAWN AEROBIC AND ANAEROBIC Blood Culture results may not be optimal  due to an inadequate volume of blood received in culture bottles   Culture  Setup Time   Final    GRAM POSITIVE COCCI IN BOTH AEROBIC AND ANAEROBIC BOTTLES GRAM VARIABLE ROD CRITICAL RESULT CALLED TO, READ BACK BY AND VERIFIED WITH: PHARMD Merlene Morse [id number] @ 2221 FH    Culture (A)  Final    PROTEUS VULGARIS STREPTOCOCCUS DYSGALACTIAE SUSCEPTIBILITIES PERFORMED ON PREVIOUS CULTURE WITHIN THE LAST 5 DAYS. Performed at [facility], [address] [place], [place] [address]    Report Status 07/29/2022 FINAL  Final   Organism ID, Bacteria STREPTOCOCCUS DYSGALACTIAE  Final      Susceptibility   Streptococcus dysgalactiae - MIC*    PENICILLIN <=0.06 SENSITIVE Sensitive     CEFTRIAXONE <=0.12 SENSITIVE Sensitive     ERYTHROMYCIN <=0.12 SENSITIVE Sensitive     LEVOFLOXACIN 0.5 SENSITIVE Sensitive     VANCOMYCIN 0.5 SENSITIVE Sensitive     * STREPTOCOCCUS DYSGALACTIAE  Blood Culture (routine x 2)     Status: Abnormal   Collection Time: 07/25/22  8:21 AM   Specimen: BLOOD  Result Value Ref Range Status   Specimen Description BLOOD SITE NOT SPECIFIED  Final   Special Requests   Final    BOTTLES DRAWN AEROBIC AND ANAEROBIC Blood Culture results may not be optimal due to an inadequate volume of blood received in culture bottles   Culture  Setup Time   Final    GRAM POSITIVE COCCI IN CHAINS GRAM POSITIVE RODS ANAEROBIC BOTTLE ONLY CRITICAL RESULT CALLED  TO, READ BACK BY AND VERIFIED WITH: PHARMD CAREN AMEND ON 07/25/22 @ 1934 BY DRT CRITICAL RESULT CALLED TO, READ BACK BY AND VERIFIED WITH: PHARMD Merlene Morse [id number] @ 2221 FH GRAM POSITIVE COCCI IN CHAINS GRAM NEGATIVE RODS BOTTLES DRAWN AEROBIC ONLY    Culture (A)  Final    PROTEUS VULGARIS STREPTOCOCCUS DYSGALACTIAE SUSCEPTIBILITIES PERFORMED ON PREVIOUS CULTURE WITHIN THE LAST 5 DAYS. GRAM POSITIVE RODS FROM GRAN STAIN UNABLE TO ISOLATE FROM CULTURE CRITICAL RESULT CALLED TO, READ BACK BY AND VERIFIED WITH: Royce Macadamia [id number] AT 1037 BY CM Performed at [facility], [address] [place], [place] [address]    Report Status 07/29/2022 FINAL  Final   Organism ID, Bacteria PROTEUS VULGARIS  Final      Susceptibility   Proteus vulgaris - MIC*    AMPICILLIN >=32 RESISTANT Resistant     CEFEPIME <=0.12 SENSITIVE Sensitive     CEFTAZIDIME <=1 SENSITIVE Sensitive     CIPROFLOXACIN <=0.25 SENSITIVE Sensitive     GENTAMICIN <=1 SENSITIVE Sensitive     IMIPENEM 4 SENSITIVE Sensitive     TRIMETH/SULFA <=20 SENSITIVE Sensitive     AMPICILLIN/SULBACTAM 16 INTERMEDIATE Intermediate     PIP/TAZO <=4 SENSITIVE Sensitive     * PROTEUS VULGARIS  Urine Culture     Status: Abnormal   Collection Time: 07/25/22  8:21 AM   Specimen: Urine, Random  Result Value Ref Range Status   Specimen Description URINE, RANDOM  Final   Special Requests   Final    NONE Reflexed from [id number] Performed at [facility], [address], [place], [place] [address]    Culture MULTIPLE SPECIES PRESENT, SUGGEST RECOLLECTION (A)  Final   Report Status 07/27/2022 FINAL  Final  Blood Culture ID Panel (Reflexed)     Status: Abnormal   Collection Time: 07/25/22  8:21 AM  Result Value Ref Range Status   Enterococcus faecalis NOT DETECTED NOT  DETECTED Final   Enterococcus Faecium NOT DETECTED NOT DETECTED Final   Listeria monocytogenes NOT DETECTED NOT DETECTED Final   Staphylococcus species NOT DETECTED  NOT DETECTED Final   Staphylococcus aureus (BCID) NOT DETECTED NOT DETECTED Final   Staphylococcus epidermidis NOT DETECTED NOT DETECTED Final   Staphylococcus lugdunensis NOT DETECTED NOT DETECTED Final   Streptococcus species DETECTED (A) NOT DETECTED Final    Comment: Not Enterococcus species, Streptococcus agalactiae, Streptococcus pyogenes, or Streptococcus pneumoniae. CRITICAL RESULT CALLED TO, READ BACK BY AND VERIFIED WITH: Mercer Pod [id number] @ 2221 FH    Streptococcus agalactiae NOT DETECTED NOT DETECTED Final   Streptococcus pneumoniae NOT DETECTED NOT DETECTED Final   Streptococcus pyogenes NOT DETECTED NOT DETECTED Final   A.calcoaceticus-baumannii NOT DETECTED NOT DETECTED Final   Bacteroides fragilis NOT DETECTED NOT DETECTED Final   Enterobacterales DETECTED (A) NOT DETECTED Final    Comment: Enterobacterales represent a large order of gram negative bacteria, not a single organism. CRITICAL RESULT CALLED TO, READ BACK BY AND VERIFIED WITH: Mercer Pod [id number] @ 2221 FH    Enterobacter cloacae complex NOT DETECTED NOT DETECTED Final   Escherichia coli NOT DETECTED NOT DETECTED Final   Klebsiella aerogenes NOT DETECTED NOT DETECTED Final   Klebsiella oxytoca NOT DETECTED NOT DETECTED Final   Klebsiella pneumoniae NOT DETECTED NOT DETECTED Final   Proteus species DETECTED (A) NOT DETECTED Final    Comment: CRITICAL RESULT CALLED TO, READ BACK BY AND VERIFIED WITH: Mercer Pod [id number] @ 2221 FH    Salmonella species NOT DETECTED NOT DETECTED Final   Serratia marcescens NOT DETECTED NOT DETECTED Final   Haemophilus influenzae NOT DETECTED NOT DETECTED Final   Neisseria meningitidis NOT DETECTED NOT DETECTED Final   Pseudomonas aeruginosa NOT DETECTED NOT DETECTED Final   Stenotrophomonas maltophilia NOT DETECTED NOT DETECTED Final   Candida albicans NOT DETECTED NOT DETECTED Final   Candida auris NOT DETECTED NOT DETECTED Final   Candida glabrata NOT DETECTED NOT  DETECTED Final   Candida krusei NOT DETECTED NOT DETECTED Final   Candida parapsilosis NOT DETECTED NOT DETECTED Final   Candida tropicalis NOT DETECTED NOT DETECTED Final   Cryptococcus neoformans/gattii NOT DETECTED NOT DETECTED Final   CTX-M ESBL NOT DETECTED NOT DETECTED Final   Carbapenem resistance IMP NOT DETECTED NOT DETECTED Final   Carbapenem resistance KPC NOT DETECTED NOT DETECTED Final   Carbapenem resistance NDM NOT DETECTED NOT DETECTED Final   Carbapenem resist OXA 48 LIKE NOT DETECTED NOT DETECTED Final   Carbapenem resistance VIM NOT DETECTED NOT DETECTED Final    Comment: Performed at [facility], [address] [place], [place] [address]  Culture, blood (Routine X 2) w Reflex to ID Panel     Status: None   Collection Time: 07/26/22  5:55 PM   Specimen: BLOOD  Result Value Ref Range Status   Specimen Description BLOOD LEFT ANTECUBITAL  Final   Special Requests   Final    BOTTLES DRAWN AEROBIC AND ANAEROBIC Blood Culture adequate volume   Culture   Final    NO GROWTH 5 DAYS Performed at [facility], [address], [place], [place] [address]    Report Status 07/31/2022 FINAL  Final  Culture, blood (Routine X 2) w Reflex to ID Panel     Status: None   Collection Time: 07/26/22  6:05 PM   Specimen: BLOOD LEFT HAND  Result Value Ref Range Status   Specimen Description BLOOD LEFT  HAND  Final   Special Requests   Final    BOTTLES DRAWN AEROBIC AND ANAEROBIC Blood Culture adequate volume   Culture   Final    NO GROWTH 5 DAYS Performed at [facility], [address], [place], [place] [address]    Report Status 07/31/2022 FINAL  Final  SARS Coronavirus 2 by RT PCR (hospital order, performed in [facility] hospital lab) *cepheid single result test* Anterior Nasal Swab     Status: None   Collection Time: 07/29/22  8:55 AM   Specimen: Anterior Nasal Swab  Result Value Ref Range Status   SARS Coronavirus 2 by RT PCR NEGATIVE NEGATIVE Final     Comment: Performed at [facility], [address], [place], [place] [address]    Studies/Results: DG Chest Port 1 View  Result Date: 07/30/2022 CLINICAL DATA:  Dyspnea EXAM: PORTABLE CHEST 1 VIEW COMPARISON:  07/29/2022 FINDINGS: The heart size and mediastinal contours are within normal limits. Both lungs are clear. The visualized skeletal structures are unremarkable. IMPRESSION: No active disease. Electronically Signed   By: Helyn Numbers M.D.   On: 07/30/2022 13:04    7/6 tte  1. No evidence of SBE or obvious vegetations.   2. Left ventricular ejection fraction, by estimation, is 55%. The left ventricle has normal function. The left ventricle has no regional wall motion abnormalities. Left ventricular diastolic parameters were normal.   3. Right ventricular systolic function is normal. The right ventricular size is normal.   4. Left atrial size was mildly dilated.   5. The mitral valve is abnormal. Trivial mitral valve regurgitation. No evidence of mitral stenosis.   6. The aortic valve is tricuspid. There is mild calcification of the aortic valve. Aortic valve regurgitation is not visualized. Aortic valve sclerosis is present, with no evidence of aortic valve stenosis.   7. The inferior vena cava is normal in size with greater than 50% respiratory variability, suggesting right atrial pressure of 3 mmHg.   7/5 ct abd pelv 1. Bilateral decubitus ulceration overlying the posterior aspect of both hips and bilateral ischium. There is gas and fluid noted in the soft tissues surrounding the hips as well as within the hip joints bilaterally. Findings are concerning for septic arthritis. 2. Soft tissue ulceration, subcutaneous gas and soft tissue thickening along the left gluteal fold towards the base of penis. 3. Signs of chronic osteomyelitis with increased sclerosis and bony destruction within bilateral pubic rami. 4. Splenomegaly. 5. Three large calcifications within the lumen of the  urinary bladder which measure up to 3.6 cm. Unchanged from previous exam. 6. Stable retroperitoneal and bilateral inguinal adenopathy. 7. Malrotation variant of the bowel. No evidence of bowel obstruction. 8. Fat containing right inguinal hernia.  Assessment/Plan:  Abx: 7/05-c Cefepime 7/05-c Daptomycin 7/05-c metronidazole  Principal Problem:   Septic shock (HCC) Active Problems:   Right shoulder pain   Decubitus ulcer due to spina bifida (HCC)   Hypokalemia   Anemia   Sepsis (HCC)   Cough    Anthony [name] is a 52 y.o. 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 Regardless should be covered by current abx regimen  Tte no obvious new vegetation  Given prior endocarditis history, worry he could have relapse. I offer treat short 2 weeks and surveillance vs 6 weeks empirically vs doing tee and see. Patient opted for the tee   -awaiting tee -continue current dapto, cefepime, flagyl    #2 bilateral septic arthritis and chronic pelvic osteomyelitis #3 chronic sacral ulcer decub  Dr [name] evaluated --> no surgical intervention needed Ongoing sepsis with negative repeat bcx suspect due to poor source control sacral ulcer/soft tissue infection IR reviewed case 7/10 --> no pelvis/sacral  ulcer associated abscess to be drained Imaging concern for potenially soft tissue involvement tracking to scrotum --> urology evaluated no sign of fournier   #4 hx of endocarditis: Transthoracic echocardiogram shows thickening of the mitral valve  See tee request as above (to be done this week by cards)   #5 swollen rue Will get duplex    I spent more than 50 minute reviewing data/chart, and coordinating care, providing direct face to face time providing counseling/discussing diagnostics/treatment plan with patient and treatment team     LOS: 5 days   Elby Blackwelder T Edrick Whitehorn 07/31/2022, 11:54 AM

## 2022-07-31 NOTE — Progress Notes (Signed)
Daily Progress Note Intern Pager: [phone]  Patient name: Anthony [name] Medical record number: [id number] Date of birth: [birth year] Age: 52 y.o. Gender: male  Primary Care Provider: Erick Alley, DO Consultants: ID, Urology, ortho Code Status: Full  Pt Overview and Major Events to Date:  7/5 admitted 7/6 Ortho consulted, stated no acute intervention indicated. ID consulted, recommended BSAbx 7/7: Hg dropped and patient received 1x pRBCs  7/8: Hg dropped again, received another unit of pRBC, [name] ordered per ID recs  7/9: NTD surgically per ortho and urology, replaced catheter 7/10:   Assessment and Plan:  Patient is a 52 yo M with PMHx including spina bifida with paraplegia with chronic sacral ulcers who was admitted for septic shock with blood cultures positive for strep species, Enterobacterales, and proteus. Likely source of infection is decubitus ulcers as supported by CT scan showing gas and fluid in soft tissue. Patient has been receiving broad spectrum antibiotics with ID following who recommended consulting urology as infection includes base of patients penis. Ortho and urology saw patient and do not see evidence for surgical intervention at this time. IR does not see site of drainage.  [facility]     * (Principal) Septic shock [facility])     Patient has maintained blood pressures. Patient continues to fever  periodically and complaints of chills and generalized weakness. Echo 7/6  without evidence of endocarditis. ID on board, who recommended a repeat  [name] for re-evaluation of endocarditis. Ortho and urology recommend  aggressive wound care but no surgical intervention at this time. [name] was  not done 7/10 d/t increased cough. IR consulted re possible drainage-  nothing to drain according to them. Exchanged suprapubic catheter 7/10.  - reschedule [name] for 7/12, NPO @ [facility] - Daptomycin (7/5 - ) - IV Cefepime (7/5 - ) - Flagyl (7/5 - )   - BCID: strep spp + proteus, no  grown >2 days  - Hold home BP meds for now, continue to monitor         Right shoulder pain     XR negative for fracture. Patient reports his shoulder is feeling  better after scheduling roxabin.  - Continue scheduled roxabin q8  - Oxycodone prn for breakthrough/severe pain, will wean as tolerated - PT/OT - Has Voltaren, topical lidocaine ordered        Decubitus ulcer due to spina bifida [facility])     Patient cleans and packs his own wounds regularly.  - Ortho recommended recommend more frequent packing dressing changes left  hip wound with follow-up in the office and see if we can obtain  authorization for Kerecis tissue graft in the office.Ortho signed off at  this time - Urology recommended suprapubic tube be changed if it has been within the  last 3 to 4 weeks since last change. Will continue with aggressive wound  care and IV antibiotics  - wound care on board - ID on board          Hypokalemia     RESOLVED. AM BMP showed resolution with K+ 3.7 - continue daily BMP        RESOLVED: Asymptomatic bacteriuria     No dysuria. UA positive for leukocytes and nitrites, seems to be  patient's normal urine. He has a foley catheter that he replaces himself  monthly. Last time it was changed was a week ago. This is likely 2/2  chronic colonization and most likely NOT contributing to  his septic  picture. Urine culture showed multiple species, likely d/t chronic  colonization. - if patient complaints of dysuria, consider UA/Culture        Anemia     Has received 2 units pRBCS during this admission. There is a questions  whether the decrease in Hgb is d/t dilution vs anemia 2/2 to chronic  disease. Transfusion threshold dependant on patient hemodynamic status,  consider <6. Decreased fluids to 1/2 maintenance yesteryday (7/9). Hg  stabilized  - repeat CBC daily         Sepsis (HCC)     Cough     Patient complaining of cough and congestion for the past day. States he  has  allergies but that this is more than usual. COVID swab negative. Chest  xray was negative for any acute process. Patient was receiving IVF + 2  units RBCs, possibly fluid overloaded. D/C IVF as patient is PO-ing well  - incentive spirometry        FEN/GI: Full PPx: Lovenox Dispo:pending clinical improvement.   Subjective:  Patient reports he is doing much better today. He has no complaints. He states the swelling is going down in his hand.   Objective: Temp:  [98.4 F (36.9 C)-102.4 F (39.1 C)] 99 F (37.2 C) (07/11 1119) Pulse Rate:  [89-114] 107 (07/11 1119) Resp:  [15-27] 20 (07/11 1119) BP: (108-147)/(61-84) 130/72 (07/11 1119) SpO2:  [94 %-96 %] 96 % (07/11 1119)  Physical Exam: General: Pale, patient is comfortable,  Cardiovascular: RRR, no M/R/G Respiratory: mild expiratory wheezing Abdomen: nondistended, soft, normal bowel sounds     Laboratory: Most recent CBC Lab Results  Component Value Date   WBC 12.7 (H) 07/31/2022   HGB 7.8 (L) 07/31/2022   HCT 27.2 (L) 07/31/2022   MCV 74.9 (L) 07/31/2022   PLT 332 07/31/2022   Most recent BMP    Latest Ref Rng & Units 07/31/2022    2:41 AM  BMP  Glucose 70 - 99 mg/dL 161   BUN 6 - 20 mg/dL 11   Creatinine 0.96 - 1.24 mg/dL 0.45   Sodium 409 - 811 mmol/L 137   Potassium 3.5 - 5.1 mmol/L 4.1   Chloride 98 - 111 mmol/L 106   CO2 22 - 32 mmol/L 23   Calcium 8.9 - 10.3 mg/dL 7.5      Imaging/Diagnostic Tests:  Cyd Silence, MD 07/31/2022, 11:23 AM  PGY-1, [facility] FPTS Intern pager: [phone], text pages welcome Secure chat group [facility] Teaching Service

## 2022-07-31 NOTE — Assessment & Plan Note (Signed)
No dysuria. UA positive for leukocytes and nitrites, seems to be patient's normal urine. He has a foley catheter that he replaces himself monthly. Last time it was changed was a week ago. This is likely 2/2 chronic colonization and most likely NOT contributing to his septic picture. Urine culture showed multiple species, likely d/t chronic colonization. - if patient complaints of dysuria, consider UA/Culture

## 2022-07-31 NOTE — Assessment & Plan Note (Addendum)
Has received 2 units pRBCS during this admission. There is a questions whether the decrease in Hgb is d/t dilution vs anemia 2/2 to chronic disease. Transfusion threshold dependant on patient hemodynamic status, consider <6. Decreased fluids to 1/2 maintenance yesteryday (7/9). Hg stabilized  - d/c fluids

## 2022-07-31 NOTE — Assessment & Plan Note (Addendum)
Patient has maintained blood pressures. Patient continues to fever periodically and complaints of chills and generalized weakness. Echo 7/6 without evidence of endocarditis. ID on board, who recommended a repeat [name] for re-evaluation of endocarditis. Ortho and urology recommend aggressive wound care but no surgical intervention at this time. [name] was not done 7/10 d/t increased cough. IR consulted re possible drainage- nothing to drain according to them. Exchanged suprapubic catheter 7/10.  - reschedule [name] for 7/12, NPO @ [facility] - Daptomycin (7/5 - ) - IV Cefepime (7/5 - ) - Flagyl (7/5 - )   - BCID: strep spp + proteus, no grown >2 days  - Hold home BP meds for now, continue to monitor

## 2022-07-31 NOTE — Assessment & Plan Note (Signed)
RESOLVED. AM BMP showed resolution with K+ 3.7 - continue daily BMP

## 2022-07-31 NOTE — Progress Notes (Signed)
Physical Therapy Treatment Patient Details Name: Anthony [name] MRN: [id number] DOB: 05-09-1970 Today's Date: 07/31/2022   History of Present Illness Pt is a 52 y.o. male who presented 07/25/22 with weakness and a fall. Pt admitted with severe sepsis secondary to sacral decubitus ulcer infection with concern for hip septic arthritis noted on CT. Imaging of R shoulder showed moderate acromioclavicular OA & mild distal lasteral subacromial spurring. PMH: complete paraplegia, multiple toe amputations, chronic sacral wounds, neurogenic bladder with suprapubic catheter, spina bifida, HTN    PT Comments  Pt received in supine and agreeable to session. Pt demonstrates increased difficulty with bed mobility and transfers due to R shoulder pain. Pt noted to have BM and required assist with pericare at beginning of session. Pt is able to pull up to longsitting with heavy use of bedrail and mod A. Pt able to lateral transfer to his w/c with mod A with the bedpad and increased time. Pt reporting having more difficulty transferring than at home due to RUE pain and being in a different environment. Pt motivated to perform w/c mobility and be out of bed. Pt able to propel w/c with LUE, but is unable to utilize RUE due to pain. Pt requires mod A for BLE management and assist with the bedpad to lateral transfer back to bed. Pt noted to have a second BM when returning to bed and required assist with pericare. Dressings noted to be soiled and RN notified. Pt continues to benefit from PT services to progress toward functional mobility goals.     Assistance Recommended at Discharge Intermittent Supervision/Assistance  If plan is discharge home, recommend the following:  Can travel by private vehicle    Two people to help with walking and/or transfers;A lot of help with bathing/dressing/bathroom;Assistance with cooking/housework;Assist for transportation;Help with stairs or ramp for entrance      Equipment  Recommendations  Hospital bed    Recommendations for Other Services       Precautions / Restrictions Precautions Precautions: Fall;Other (comment) Precaution Comments: paraplegia Restrictions Weight Bearing Restrictions: No     Mobility  Bed Mobility Overal bed mobility: Needs Assistance Bed Mobility: Rolling, Supine to Sit, Sit to Supine Rolling: Min guard   Supine to sit: Mod assist Sit to supine: Mod assist   General bed mobility comments: Assist for trunk elevation and BLE advancement to EOB. Assist for BLE elevation to EOB to return to supine. Heavy use of bedrails to for pt to come to longsitting, then scoot towards EOB    Transfers Overall transfer level: Needs assistance Equipment used: None Transfers: Bed to chair/wheelchair/BSC            Lateral/Scoot Transfers: Mod assist General transfer comment: Mod A with bedpad to assist pt over bedrail to/from w/c and with BLE positioning      Corporate treasurer Wheelchair mobility: Yes Wheelchair propulsion: Left upper extremity Wheelchair parts: Independent Distance: 389ft Wheelchair Assistance Details (indicate cue type and reason): Pt able to propel w/c with LUE, however is unable to use RUE due to shoulder pain. Pt is assisted to keep w/c straight, but is able to assist the full distance. Assist with line management.      Balance Overall balance assessment: Needs assistance Sitting-balance support: Single extremity supported, Feet unsupported Sitting balance-Leahy Scale: Fair Sitting balance - Comments: sitting EOB  Cognition Arousal/Alertness: Awake/alert Behavior During Therapy: WFL for tasks assessed/performed Overall Cognitive Status: Within Functional Limits for tasks assessed                                          Exercises      General Comments General comments (skin integrity, edema, etc.): HR max  111 bpm      Pertinent Vitals/Pain Pain Assessment Pain Assessment: Faces Faces Pain Scale: Hurts whole lot Pain Location: R shoulder with mobility Pain Descriptors / Indicators: Grimacing, Aching, Guarding Pain Intervention(s): Monitored during session, Limited activity within patient's tolerance     PT Goals (current goals can now be found in the care plan section) Acute Rehab PT Goals Patient Stated Goal: to improve and go home PT Goal Formulation: With patient Time For Goal Achievement: 08/09/22 Potential to Achieve Goals: Good Progress towards PT goals: Progressing toward goals    Frequency    Min 1X/week      PT Plan Current plan remains appropriate       AM-PAC PT "6 Clicks" Mobility   Outcome Measure  Help needed turning from your back to your side while in a flat bed without using bedrails?: A Little Help needed moving from lying on your back to sitting on the side of a flat bed without using bedrails?: A Lot Help needed moving to and from a bed to a chair (including a wheelchair)?: A Lot Help needed standing up from a chair using your arms (e.g., wheelchair or bedside chair)?: Total Help needed to walk in hospital room?: Total Help needed climbing 3-5 steps with a railing? : Total 6 Click Score: 10    End of Session   Activity Tolerance: Patient tolerated treatment well Patient left: in bed;with call bell/phone within reach Nurse Communication: Mobility status (dressing change needed due to BM) PT Visit Diagnosis: Muscle weakness (generalized) (M62.81);History of falling (Z91.81);Pain Pain - Right/Left: Right Pain - part of body: Shoulder     Time: 1191-4782 PT Time Calculation (min) (ACUTE ONLY): 62 min  Charges:    $Therapeutic Activity: 23-37 mins $Wheel Chair Management: 23-37 mins PT General Charges $$ ACUTE PT VISIT: 1 Visit                     [name], PTA Acute Rehabilitation Services Secure Chat Preferred  [phone])  [phone]    [name] 07/31/2022, 11:11 AM

## 2022-07-31 NOTE — TOC Progression Note (Signed)
Transition of Care [facility]) - Progression Note    Patient Details  Name: Anthony [name] MRN: [id number] Date of Birth: 02-Nov-1970  Transition of [facility]) CM/SW Contact  Eduard Roux, [place] Phone Number: 07/31/2022, 3:03 PM  Clinical Narrative:     [name] spoke with patient and his mother by phone(speaker)- [name] informed [facility] declined bed offer - patient will need to contact their business office to discuss balance owed to the facility .  Barrier at this time- patient has a balance that must be resolved before he can d/c to SNF.  TOC will continue follow and assist with discharge summary.  [name], MSW, LCSW Clinical Social Worker         Expected Discharge Plan and Services                                               Social Determinants of Health (SDOH) Interventions SDOH Screenings   Food Insecurity: No Food Insecurity (07/26/2022)  Housing: Low Risk  (07/26/2022)  Transportation Needs: No Transportation Needs (07/26/2022)  Utilities: Not At Risk (07/26/2022)  Alcohol Screen: Low Risk  (01/23/2022)  Depression (PHQ2-9): Low Risk  (02/27/2022)  Financial Resource Strain: Medium Risk (01/23/2022)  Physical Activity: Inactive (01/23/2022)  Social Connections: Socially Isolated (01/23/2022)  Stress: Stress Concern Present (01/23/2022)  Tobacco Use: Medium Risk (07/30/2022)    Readmission Risk Interventions     No data to display

## 2022-07-31 NOTE — Assessment & Plan Note (Signed)
Patient cleans and packs his own wounds regularly.  - Ortho recommended recommend more frequent packing dressing changes left hip wound with follow-up in the office and see if we can obtain authorization for Kerecis tissue graft in the office.Ortho signed off at this time - Urology recommended suprapubic tube be changed if it has been within the last 3 to 4 weeks since last change. Will continue with aggressive wound care and IV antibiotics  - wound care on board - ID on board

## 2022-07-31 NOTE — Progress Notes (Signed)
   1 Day Post-Op Subjective: 7/11: No acute events overnight.  Draining appropriately.   Objective: Vital signs in last 24 hours: Temp:  [98.4 F (36.9 C)-99.5 F (37.5 C)] 99 F (37.2 C) (07/11 1119) Pulse Rate:  [89-109] 106 (07/11 1119) Resp:  [15-24] 20 (07/11 1119) BP: (116-147)/(66-84) 130/72 (07/11 1119) SpO2:  [94 %-96 %] 96 % (07/11 1119)  Intake/Output from previous day: 07/10 0701 - 07/11 0700 In: 140 [P.O.:140] Out: 1500 [Urine:1500]  Intake/Output this shift: Total I/O In: -  Out: 1500 [Urine:1500]  Physical Exam:  General: Alert and oriented CV: No cyanosis Lungs: equal chest rise Abdomen: Soft, NTND, no rebound or guarding Gu: Suprapubic tube in place draining clear yellow urine.  Lab Results: Recent Labs    07/29/22 0950 07/30/22 0010 07/31/22 0241  HGB 8.1* 7.5* 7.8*  HCT 27.0* 25.6* 27.2*   BMET Recent Labs    07/30/22 0010 07/31/22 0241  NA 140 137  K 3.9 4.1  CL 109 106  CO2 25 23  GLUCOSE 103* 114*  BUN 7 11  CREATININE 0.50* 0.66  CALCIUM 7.4* 7.5*     Studies/Results: DG Chest Port 1 View  Result Date: 07/30/2022 CLINICAL DATA:  Dyspnea EXAM: PORTABLE CHEST 1 VIEW COMPARISON:  07/29/2022 FINDINGS: The heart size and mediastinal contours are within normal limits. Both lungs are clear. The visualized skeletal structures are unremarkable. IMPRESSION: No active disease. Electronically Signed   By: Helyn Numbers M.D.   On: 07/30/2022 13:04    Assessment/Plan:  # Perineal fistula No cellulitis or fluctuance in perineum/inguinal region.  No surgical indication at this time.  Patient has been remotely seen by [facility].  He would like to follow-up to discuss if urology/plastic surgery could help repair this fistulous area in the future.    Suprapubic tube in place and functioning appropriately.  Renal indices within normal limits.  Urology will sign off at this time.  Feel free to call with questions   LOS: 5 days    Elmon Kirschner, NP [facility] Pager: [phone]  07/31/2022, 1:05 PM

## 2022-08-01 ENCOUNTER — Other Ambulatory Visit (HOSPITAL_COMMUNITY): Payer: Medicare HMO

## 2022-08-01 ENCOUNTER — Encounter (HOSPITAL_COMMUNITY): Payer: Self-pay | Admitting: Family Medicine

## 2022-08-01 ENCOUNTER — Inpatient Hospital Stay (HOSPITAL_COMMUNITY): Payer: Medicare HMO

## 2022-08-01 DIAGNOSIS — M00059 Staphylococcal arthritis, unspecified hip: Secondary | ICD-10-CM

## 2022-08-01 DIAGNOSIS — M7989 Other specified soft tissue disorders: Secondary | ICD-10-CM

## 2022-08-01 DIAGNOSIS — M86659 Other chronic osteomyelitis, unspecified thigh: Secondary | ICD-10-CM

## 2022-08-01 DIAGNOSIS — L8944 Pressure ulcer of contiguous site of back, buttock and hip, stage 4: Secondary | ICD-10-CM | POA: Diagnosis not present

## 2022-08-01 LAB — BASIC METABOLIC PANEL
Anion gap: 4 — ABNORMAL LOW (ref 5–15)
BUN: 9 mg/dL (ref 6–20)
CO2: 25 mmol/L (ref 22–32)
Calcium: 7.4 mg/dL — ABNORMAL LOW (ref 8.9–10.3)
Chloride: 108 mmol/L (ref 98–111)
Creatinine, Ser: 0.46 mg/dL — ABNORMAL LOW (ref 0.61–1.24)
GFR, Estimated: >60 mL/min (ref >60)
Glucose, Bld: 99 mg/dL (ref 70–99)
Potassium: 4 mmol/L (ref 3.5–5.1)
Sodium: 137 mmol/L (ref 135–145)

## 2022-08-01 LAB — CBC
HCT: 24.2 % — ABNORMAL LOW (ref 39.0–52.0)
Hemoglobin: 7 g/dL — ABNORMAL LOW (ref 13.0–17.0)
MCH: 21.2 pg — ABNORMAL LOW (ref 26.0–34.0)
MCHC: 28.9 g/dL — ABNORMAL LOW (ref 30.0–36.0)
MCV: 73.3 fL — ABNORMAL LOW (ref 80.0–100.0)
Platelets: 330 10*3/uL (ref 150–400)
RBC: 3.3 MIL/uL — ABNORMAL LOW (ref 4.22–5.81)
RDW: 24.1 % — ABNORMAL HIGH (ref 11.5–15.5)
WBC: 14.1 10*3/uL — ABNORMAL HIGH (ref 4.0–10.5)
nRBC: 0 % (ref 0.0–0.2)

## 2022-08-01 LAB — PREPARE RBC (CROSSMATCH)

## 2022-08-01 LAB — HEMOGLOBIN AND HEMATOCRIT, BLOOD
HCT: 26.9 % — ABNORMAL LOW (ref 39.0–52.0)
Hemoglobin: 8 g/dL — ABNORMAL LOW (ref 13.0–17.0)

## 2022-08-01 LAB — GLUCOSE, CAPILLARY: Glucose-Capillary: 71 mg/dL (ref 70–99)

## 2022-08-01 MED ORDER — HEPARIN (PORCINE) 25000 UT/250ML-% IV SOLN
1350.0000 [IU]/h | INTRAVENOUS | Status: DC
  Administered 2022-08-01: 1350 [IU]/h via INTRAVENOUS
  Filled 2022-08-01: qty 250

## 2022-08-01 MED ORDER — ENOXAPARIN SODIUM 80 MG/0.8ML IJ SOSY
1.0000 mg/kg | PREFILLED_SYRINGE | Freq: Two times a day (BID) | INTRAMUSCULAR | Status: DC
  Administered 2022-08-01 – 2022-08-06 (×11): 80 mg via SUBCUTANEOUS
  Filled 2022-08-01 (×11): qty 0.8

## 2022-08-01 NOTE — Progress Notes (Signed)
Daily Progress Note Intern Pager: [phone]  Patient name: Anthony [name] Medical record number: [id number] Date of birth: 04/03/1970 Age: 52 y.o. Gender: male  Primary Care Provider: Erick Alley, DO Consultants: Orthopaedics, Infectious Disease, Urology, IR Code Status: Full  Pt Overview and Major Events to Date:  7/5 admitted 7/6 Ortho consulted, stated no acute intervention indicated. ID consulted, recommended BSAbx 7/7: Hg dropped and patient received 1x pRBCs  7/8: Hg dropped again, received another unit of pRBC, [name] ordered per ID recs  7/9: NTD surgically per ortho and urology, replaced catheter 7/12: [name], received 1x pRBC  Assessment and Plan: Patient is a 52 yo M with PMHx including spina bifida with paraplegia with chronic sacral ulcers who was admitted for septic shock with blood cultures positive for strep species, Enterobacterales, and proteus. Likely source of infection is decubitus ulcers as supported by CT scan showing gas and fluid in soft tissue. Patient has been receiving broad spectrum antibiotics with ID, with urology, orthopaedics and IR consulted regarding any surgical management- who all signed off.  [facility]     * (Principal) Septic shock [facility])     Patient has maintained blood pressures. Patient continues to fever  periodically and complaints of chills and generalized weakness. Echo 7/6  without evidence of endocarditis. ID on board, who recommended a repeat  [name] for re-evaluation of endocarditis. Ortho and urology recommend  aggressive wound care but no surgical intervention at this time. IR  consulted re possible drainage- nothing to drain according to them.  Exchanged suprapubic catheter 7/10.  - [name] scheduled for today (7/12) - Daptomycin (7/5 - ) - IV Cefepime (7/5 - ) - Flagyl (7/5 - )   - BCID: strep spp + proteus, no grown >2 days  - prn ibuprofen for fever        Right shoulder pain     XR negative for fracture. Patient has  increased pain since working with  OT yesterday.  - multimodal pain management with roxabin, volataren, topical lidocaine  and oxycodone prn for breakthrough pain - PT/OT following         Decubitus ulcer due to spina bifida [facility])     Patient cleans and packs his own wounds regularly.  - Ortho recommended recommend more frequent packing dressing changes left  hip wound with follow-up in the office and see if we can obtain  authorization for Kerecis tissue graft in the office.Ortho signed off at  this time - Urology recommended suprapubic tube be changed if it has been within the  last 3 to 4 weeks since last change. Will continue with aggressive wound  care and IV antibiotics  - wound care on board - ID on board          Hypokalemia     RESOLVED. AM BMP showed resolution with K+ 3.7 - continue daily BMP        RESOLVED: Asymptomatic bacteriuria     No dysuria. UA positive for leukocytes and nitrites, seems to be  patient's normal urine. He has a foley catheter that he replaces himself  monthly. Last time it was changed was a week ago. This is likely 2/2  chronic colonization and most likely NOT contributing to his septic  picture. Urine culture showed multiple species, likely d/t chronic  colonization. - if patient complaints of dysuria, consider UA/Culture        Anemia of infection and chronic disease  Has received 2 units pRBCS during this admission. There is a questions  whether the decrease in Hgb is d/t dilution vs anemia 2/2 to sepsis.  Transfusion threshold dependant on patient hemodynamic status. Patients Hg  downtrending (7.7>7.5>7.4) -1x RBC prior to [name] 7/12 per cardiology         Sepsis (HCC)     Cough     Patient complaining of cough and congestion 7/10. States he has  allergies but that this is more than usual. COVID swab negative. Chest  xray was negative for any acute process. Patient was receiving IVF + 2  units RBCs, possibly 2/2 to fluid  overloaded. Today patient reports  feeling much better with minimal cough. Lungs CTAB, no wheezing  appreciated. - continue to monitor       FEN/GI: Full PPx: Lovenox Dispo:pending clinical improvement  Subjective:  Patient reports he is doing "alright." He reports his cough is better since yesterday. He complains of increased R shoulder pain 2/2 to working with PT/OT yesterday. No other complaints at this time.   Objective: Temp:  [98 F (36.7 C)-99.4 F (37.4 C)] 98 F (36.7 C) (07/12 0804) Pulse Rate:  [96-108] 98 (07/12 0804) Resp:  [17-27] 19 (07/12 0804) BP: (129-151)/(74-84) 129/74 (07/12 0804) SpO2:  [94 %-98 %] 98 % (07/12 0804) Physical Exam: General: Patient comfortable, sleeping upon arrival. Cardiovascular: RRR, no M/R/G Respiratory: CTAB Abdomen: nondistended, soft, normal bowel sounds  Laboratory: Most recent CBC Lab Results  Component Value Date   WBC 14.1 (H) 08/01/2022   HGB 7.0 (L) 08/01/2022   HCT 24.2 (L) 08/01/2022   MCV 73.3 (L) 08/01/2022   PLT 330 08/01/2022   Most recent BMP    Latest Ref Rng & Units 08/01/2022    2:35 AM  BMP  Glucose 70 - 99 mg/dL 99   BUN 6 - 20 mg/dL 9   Creatinine 1.61 - 0.96 mg/dL 0.45   Sodium 409 - 811 mmol/L 137   Potassium 3.5 - 5.1 mmol/L 4.0   Chloride 98 - 111 mmol/L 108   CO2 22 - 32 mmol/L 25   Calcium 8.9 - 10.3 mg/dL 7.4     Talmage Teaster Jaevon Paras, MD 08/01/2022, 11:39 AM  PGY-1, [facility] FPTS Intern pager: [phone], text pages welcome Secure chat group [facility] Teaching Service

## 2022-08-01 NOTE — Progress Notes (Signed)
Right upper extremity venous study completed.   Preliminary results relayed to MD and RN.  Please see CV Procedures for preliminary results.  Shinichi Anguiano, RVT  2:27 PM 08/01/22

## 2022-08-01 NOTE — Progress Notes (Signed)
Mobility Specialist: Progress Note   08/01/22 1605  Mobility  Activity Refused mobility  Mobility Specialist Start Time (ACUTE ONLY) 1600  Mobility Specialist Stop Time (ACUTE ONLY) 1604  Mobility Specialist Time Calculation (min) (ACUTE ONLY) 4 min   Pt refused mobility stating he had "chills" and requesting to stay in bed. Will f/u as able.   Cathlin Buchan Mobility Specialist Please contact via SecureChat or Rehab office at [phone]

## 2022-08-01 NOTE — Progress Notes (Signed)
Pt now getting continuous heparin gtt d/t DVT right arm. He has a single lumen picc and since we cannot get an IV in the R arm d/t DVT and an IV cannot be stuck in the left arm d/t PICC. The continuous heparin is now going in the picc and only the dapto is compatible with it (cefepime is not). IV team came by (see note). Called Pharmacy who states pharmacist says either switch cefepime to zosyn if OK by ID or they do have cefepime via IM route but they aren't as consistent with their delivery and they don't recommend this route. Messaged family medicine team to inform them of the above.

## 2022-08-01 NOTE — Assessment & Plan Note (Signed)
Patient complaining of cough and congestion 7/10. States he has allergies but that this is more than usual. COVID swab negative. Chest xray was negative for any acute process. Patient was receiving IVF + 2 units RBCs, possibly 2/2 to fluid overloaded. Today patient reports feeling much better with minimal cough. Lungs CTAB, no wheezing appreciated. - continue to monitor

## 2022-08-01 NOTE — Progress Notes (Signed)
BP 130/70 (BP Location: Right Leg)   Pulse (!) 106   Temp (!) 100.4 F (38 C) (Oral)   Resp 20   Ht 5\' 8"  (1.727 m)   Wt 79.4 kg   SpO2 95%   BMI 26.61 kg/m    Pt happened to have Q4 vital check during blood admin - temp noted to be 100.29F (starting temp at beginning of transfusion 99.2F). Spoke to blood bank who stated a 1-2 F degree difference from baseline along with other symptoms could potentially warrant a reaction, however pt is not having any other symptoms and he had not had any previous issues with the 2 other units of blood so not likely a reaction more likely his usual fluctuation in body temp since pt is septic shock and has random temperature spikes. Spoke to family med Dr. Hyacinth Meeker who agreed with the above as well and was okay with letting this unit of blood finish up.

## 2022-08-01 NOTE — Assessment & Plan Note (Addendum)
Has received 2 units pRBCS during this admission. There is a questions whether the decrease in Hgb is d/t dilution vs anemia 2/2 to sepsis. Transfusion threshold dependant on patient hemodynamic status. Patients Hg downtrending (7.7>7.5>7.4) -1x RBC prior to [name] 7/12 per cardiology

## 2022-08-01 NOTE — Progress Notes (Signed)
FMTS Interim Progress Note  S: Went to the bedside with Dr. Mliss Sax to check on patient. He reports feeling hot and cold throughout the day.  Otherwise, has no complaints.  O: BP 134/68   Pulse (!) 106   Temp 98.5 F (36.9 C) (Oral)   Resp 20   Ht 5\' 8"  (1.727 m)   Wt 79.4 kg   SpO2 96%   BMI 26.61 kg/m   General: Chronically ill-appearing, laying in bed no distress CV: Sinus tach to 100s Respiratory: No increased work of breathing on room air Extremities: PICC line in left arm  A/P: Acute DVT of right upper extremity Given single-lumen PICC and inability to get IV in the right arm due to DVT, with incompatibility's with continuous heparin and cefepime, plan to discontinue heparin and start VTE treatment dose Lovenox 1 mg/kilogram SQ every 12 hours.  Polymicrobial bacterial infection (possibly due to septic arthritis of both hips, chronic osteomyelitis) Stable.  Vital signs reviewed.  Will monitor fever curve and other vitals closely overnight. -Remains on cefepime/daptomycin/Flagyl IV, day 6  Confirmed with nighttime RN that patient did indeed complete 1U PRBC transfusion today. -Posttransfusion H&H   Darral Dash, DO 08/01/2022, 7:54 PM PGY-3, [facility] Family Medicine Service pager [phone]

## 2022-08-01 NOTE — Anesthesia Preprocedure Evaluation (Addendum)
Anesthesia Evaluation  Patient identified by MRN, date of birth, ID band Patient awake    Reviewed: Allergy & Precautions, NPO status , Patient's Chart, lab work & pertinent test results  History of Anesthesia Complications Negative for: history of anesthetic complications  Airway Mallampati: II  TM Distance: >3 FB Neck ROM: Full    Dental  (+) Poor Dentition, Missing, Dental Advisory Given, Chipped   Pulmonary former smoker   Pulmonary exam normal breath sounds clear to auscultation       Cardiovascular hypertension, Pt. on medications and Pt. on home beta blockers Normal cardiovascular exam+ dysrhythmias Atrial Fibrillation  Rhythm:Regular Rate:Normal  Echo 09/09/2021 1. Left ventricular ejection fraction, by estimation, is 60 to 65%. The left ventricle has normal function. The left ventricle has no regional wall motion abnormalities.  2. Right ventricular systolic function is normal. The right ventricular size is normal.  3. Abnormal mitral valve with a small mobile density on the ventricular side of the anterior mitral valve leaflet. In the setting of bacteremia, cannot rule out vegetation. Mild mitral valve regurgitation. No evidence of mitral stenosis.  4. The aortic valve is normal in structure. Aortic valve regurgitation is trivial. No aortic stenosis is present.  5. The inferior vena cava is normal in size with greater than 50% respiratory variability, suggesting right atrial pressure of 3 mmHg.  6. No left atrial/left atrial appendage thrombus was detected.    Neuro/Psych  PSYCHIATRIC DISORDERS Anxiety      Spina bifida Wheelchair bound     GI/Hepatic negative GI ROS, Neg liver ROS,,,  Endo/Other  negative endocrine ROS    Renal/GU negative Renal ROS    Chronic indwelling foley     Musculoskeletal  (+) Arthritis ,  Septic Left Hip   Abdominal   Peds  Hematology  (+) Blood dyscrasia, anemia    Anesthesia Other Findings Day of surgery medications reviewed with patient.  Reproductive/Obstetrics negative OB ROS                             Anesthesia Physical Anesthesia Plan  ASA: 3  Anesthesia Plan: MAC   Post-op Pain Management: Minimal or no pain anticipated   Induction: Intravenous  PONV Risk Score and Plan: 1 and Ondansetron, Treatment may vary due to age or medical condition, Propofol infusion and TIVA  Airway Management Planned: Simple Face Mask  Additional Equipment: None  Intra-op Plan:   Post-operative Plan:   Informed Consent: I have reviewed the patients History and Physical, chart, labs and discussed the procedure including the risks, benefits and alternatives for the proposed anesthesia with the patient or authorized representative who has indicated his/her understanding and acceptance.     Dental advisory given  Plan Discussed with: CRNA  Anesthesia Plan Comments:         Anesthesia Quick Evaluation

## 2022-08-01 NOTE — Progress Notes (Signed)
ANTICOAGULATION CONSULT NOTE - Initial Consult  Pharmacy Consult for heparin Indication: DVT  Allergies  Allergen Reactions   Firvanq [Vancomycin] Itching    Patient Measurements: Height: 5\' 8"  (172.7 cm) Weight: 79.4 kg (175 lb) IBW/kg (Calculated) : 68.4 Heparin Dosing Weight: 80  Vital Signs: Temp: 99.1 F (37.3 C) (07/12 1458) Temp Source: Oral (07/12 1458) BP: 127/73 (07/12 1458) Pulse Rate: 100 (07/12 1458)  Labs: Recent Labs    07/30/22 0010 07/31/22 0241 08/01/22 0235  HGB 7.5* 7.8* 7.0*  HCT 25.6* 27.2* 24.2*  PLT 303 332 330  CREATININE 0.50* 0.66 0.46*    Estimated Creatinine Clearance: 104.5 mL/min (A) (by C-G formula based on SCr of 0.46 mg/dL (L)).   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 01/24/2017   Osteomyelitis (HCC)    left hip   Paraplegia (HCC)    secondary to Spina Bifida   Polymicrobial bacteremia with sepsis:    Pressure injury of skin of left buttock 05/13/2021   PVCs (premature ventricular contractions)    S/P debridement 10/30/2021   Septic shock (HCC)    due to osteomyelitis   Slow transit constipation 03/06/2017   Spina bifida    Status post debridement 01/24/2017   Wheelchair bound     Medications:  Medications Prior to Admission  Medication Sig Dispense Refill Last Dose   acetaminophen (TYLENOL) 500 MG tablet Take 1,000 mg by mouth daily as needed for moderate pain, headache or fever.   07/24/2022   flecainide (TAMBOCOR) 50 MG tablet Take 1 tablet (50 mg total) by mouth 2 (two) times daily. 180 tablet 3 07/24/2022   ibuprofen (ADVIL) 200 MG tablet Take 200 mg by mouth every 6 (six) hours as  needed for headache or moderate pain.   07/24/2022   lisinopril-hydrochlorothiazide (ZESTORETIC) 20-25 MG tablet Take 1 tablet by mouth daily. 90 tablet 3 07/24/2022   metoprolol succinate (TOPROL-XL) 50 MG 24 hr tablet Take 1 tablet (50 mg total) by mouth 2 (two) times daily. Take with or immediately following a meal. 180 tablet 3 07/24/2022   Multiple Vitamin (MULTIVITAMIN WITH MINERALS) TABS tablet Take 1 tablet by mouth daily.   07/24/2022   nitroGLYCERIN (NITROSTAT) 0.4 MG SL tablet Place 1 tablet (0.4 mg total) under the tongue every 5 (five) minutes as needed for chest pain. 25 tablet 5 UNK   Omega-3 Fatty Acids (FISH OIL PO) Take 1 capsule by mouth daily.   07/24/2022   POTASSIUM CHLORIDE PO Take 1 tablet by mouth daily. OTC potassium supplement   07/24/2022   rosuvastatin (CRESTOR) 40 MG tablet Take 1 tablet by mouth once daily (Patient taking differently: Take 40 mg by mouth every evening.) 90 tablet 3 Past Week   Catheters (FOLEY CATHETER 2-WAY) MISC Use as directed by urologist. 5 each 1    Scheduled:   acetaminophen  500 mg Oral Q6H   Chlorhexidine Gluconate Cloth  6 each Topical Daily   feeding supplement  237 mL Oral TID BM   flecainide  50 mg Oral BID   lidocaine  1-2 patch Transdermal Q24H   loratadine  10 mg  Oral Daily   methocarbamol  1,000 mg Oral Q8H   metroNIDAZOLE  500 mg Oral Q12H   multivitamin with minerals  1 tablet Oral Daily   sodium chloride flush  10-40 mL Intracatheter Q12H   zinc sulfate  220 mg Oral Daily   Infusions:   sodium chloride 20 mL/hr at 08/01/22 0353   ceFEPime (MAXIPIME) IV 2 g (08/01/22 1009)   DAPTOmycin (CUBICIN) 650 mg in sodium chloride 0.9 % IVPB Stopped (08/01/22 1411)   PRN: diclofenac Sodium, ibuprofen, oxyCODONE **OR** [DISCONTINUED] oxyCODONE, polyethylene glycol, prochlorperazine, sodium chloride flush Anti-infectives (From admission, onward)    Start     Dose/Rate Route Frequency Ordered Stop   07/28/22 2200  metroNIDAZOLE (FLAGYL) tablet  500 mg        500 mg Oral Every 12 hours 07/28/22 1412     07/26/22 1800  ceFEPIme (MAXIPIME) 2 g in sodium chloride 0.9 % 100 mL IVPB        2 g 200 mL/hr over 30 Minutes Intravenous Every 8 hours 07/26/22 1745     07/25/22 2100  ceFEPIme (MAXIPIME) 2 g in sodium chloride 0.9 % 100 mL IVPB  Status:  Discontinued        2 g 200 mL/hr over 30 Minutes Intravenous Every 12 hours 07/25/22 0926 07/26/22 1745   07/25/22 2100  metroNIDAZOLE (FLAGYL) IVPB 500 mg  Status:  Discontinued        500 mg 100 mL/hr over 60 Minutes Intravenous Every 12 hours 07/25/22 1900 07/28/22 1412   07/25/22 1700  DAPTOmycin (CUBICIN) 650 mg in sodium chloride 0.9 % IVPB        8 mg/kg  79.4 kg 126 mL/hr over 30 Minutes Intravenous Daily 07/25/22 1546     07/25/22 0800  ceFEPIme (MAXIPIME) 2 g in sodium chloride 0.9 % 100 mL IVPB        2 g 200 mL/hr over 30 Minutes Intravenous  Once 07/25/22 0758 07/25/22 0942   07/25/22 0800  metroNIDAZOLE (FLAGYL) IVPB 500 mg        500 mg 100 mL/hr over 60 Minutes Intravenous  Once 07/25/22 0758 07/25/22 1159       Assessment: 52 year old male who presents with complaints of sepsis and previously on Lovenox for VTE prophylaxis. Now found to have new VTE in a upper extremity. Pharmacy consulted to start heparin.   Patient has received multiple blood transfusions during this admission. Hemoglobin continues to decline. Will forgo IV bolus.   Goal of Therapy:  Heparin level 0.3-0.5 units/ml Monitor platelets by anticoagulation protocol: Yes   Plan:  Start heparin infusion at 1350 units/hr Check anti-Xa level in 6 hours and daily while on heparin Continue to monitor H&H and platelets  Merla Riches 08/01/2022,3:23 PM

## 2022-08-01 NOTE — TOC Progression Note (Signed)
Transition of Care [facility]) - Progression Note    Patient Details  Name: Anthony [name] MRN: [id number] Date of Birth: Aug 09, 1970  Transition of Care [facility]) CM/SW Contact  Eduard Roux, [place] Phone Number: 08/01/2022, 2:34 PM  Clinical Narrative:     [facility] declined - at this time, patient has no bed offers & SNF is not an option.     Barriers to Discharge: SNF Pending bed offer- patient owes SNF balance that needs to be resolved.   Expected Discharge Plan and Services                                               Social Determinants of Health (SDOH) Interventions SDOH Screenings   Food Insecurity: No Food Insecurity (07/26/2022)  Housing: Low Risk  (07/26/2022)  Transportation Needs: No Transportation Needs (07/26/2022)  Utilities: Not At Risk (07/26/2022)  Alcohol Screen: Low Risk  (01/23/2022)  Depression (PHQ2-9): Low Risk  (02/27/2022)  Financial Resource Strain: Medium Risk (01/23/2022)  Physical Activity: Inactive (01/23/2022)  Social Connections: Socially Isolated (01/23/2022)  Stress: Stress Concern Present (01/23/2022)  Tobacco Use: Medium Risk (07/30/2022)    Readmission Risk Interventions     No data to display

## 2022-08-01 NOTE — Assessment & Plan Note (Addendum)
Patient has maintained blood pressures. Patient continues to fever periodically and complaints of chills and generalized weakness. Echo 7/6 without evidence of endocarditis. ID on board, who recommended a repeat [name] for re-evaluation of endocarditis. Ortho and urology recommend aggressive wound care but no surgical intervention at this time. IR consulted re possible drainage- nothing to drain according to them. Exchanged suprapubic catheter 7/10.  - [name] scheduled for today (7/12) - Daptomycin (7/5 - ) - IV Cefepime (7/5 - ) - Flagyl (7/5 - )   - BCID: strep spp + proteus, no grown >2 days  - prn ibuprofen for fever

## 2022-08-01 NOTE — Assessment & Plan Note (Signed)
XR negative for fracture. Patient has increased pain since working with OT yesterday.  - multimodal pain management with roxabin, volataren, topical lidocaine and oxycodone prn for breakthrough pain - PT/OT following

## 2022-08-01 NOTE — Progress Notes (Signed)
At bedside for IVT consult. Pt receiving multiple antibiotics and a heparin drip. Per policy, no sticks of any kind in the left extremity. Right extremity has a DV. Recommended running compatible abx together and to call pharmacy for further instructions regarding medication administration. Primary RN at bedside and aware.

## 2022-08-02 ENCOUNTER — Inpatient Hospital Stay (HOSPITAL_COMMUNITY): Payer: Medicare HMO

## 2022-08-02 DIAGNOSIS — I82A11 Acute embolism and thrombosis of right axillary vein: Secondary | ICD-10-CM | POA: Diagnosis not present

## 2022-08-02 DIAGNOSIS — M86659 Other chronic osteomyelitis, unspecified thigh: Secondary | ICD-10-CM | POA: Diagnosis not present

## 2022-08-02 DIAGNOSIS — B999 Unspecified infectious disease: Secondary | ICD-10-CM | POA: Diagnosis not present

## 2022-08-02 DIAGNOSIS — R7881 Bacteremia: Secondary | ICD-10-CM

## 2022-08-02 LAB — CBC
HCT: 26.3 % — ABNORMAL LOW (ref 39.0–52.0)
Hemoglobin: 7.8 g/dL — ABNORMAL LOW (ref 13.0–17.0)
MCH: 22.6 pg — ABNORMAL LOW (ref 26.0–34.0)
MCHC: 29.7 g/dL — ABNORMAL LOW (ref 30.0–36.0)
MCV: 76.2 fL — ABNORMAL LOW (ref 80.0–100.0)
Platelets: 370 10*3/uL (ref 150–400)
RBC: 3.45 MIL/uL — ABNORMAL LOW (ref 4.22–5.81)
RDW: 24.6 % — ABNORMAL HIGH (ref 11.5–15.5)
WBC: 11.5 10*3/uL — ABNORMAL HIGH (ref 4.0–10.5)
nRBC: 0 % (ref 0.0–0.2)

## 2022-08-02 LAB — BASIC METABOLIC PANEL
Anion gap: 13 (ref 5–15)
BUN: 9 mg/dL (ref 6–20)
CO2: 23 mmol/L (ref 22–32)
Calcium: 7.7 mg/dL — ABNORMAL LOW (ref 8.9–10.3)
Chloride: 103 mmol/L (ref 98–111)
Creatinine, Ser: 0.44 mg/dL — ABNORMAL LOW (ref 0.61–1.24)
GFR, Estimated: >60 mL/min (ref >60)
Glucose, Bld: 117 mg/dL — ABNORMAL HIGH (ref 70–99)
Potassium: 3.6 mmol/L (ref 3.5–5.1)
Sodium: 139 mmol/L (ref 135–145)

## 2022-08-02 LAB — CK: Total CK: 9 U/L — ABNORMAL LOW (ref 49–397)

## 2022-08-02 MED ORDER — LACTATED RINGERS IV BOLUS
500.0000 mL | Freq: Once | INTRAVENOUS | Status: AC
  Administered 2022-08-02: 500 mL via INTRAVENOUS

## 2022-08-02 MED ORDER — LORAZEPAM 1 MG PO TABS
1.0000 mg | ORAL_TABLET | Freq: Once | ORAL | Status: DC | PRN
  Filled 2022-08-02: qty 1

## 2022-08-02 MED ORDER — ACETAMINOPHEN 500 MG PO TABS: 500.0000 mg | ORAL_TABLET | Freq: Once | ORAL | Status: DC

## 2022-08-02 MED ORDER — ACETAMINOPHEN 500 MG PO TABS
1000.0000 mg | ORAL_TABLET | Freq: Four times a day (QID) | ORAL | Status: DC
  Administered 2022-08-02 – 2022-08-04 (×7): 1000 mg via ORAL
  Filled 2022-08-02 (×8): qty 2

## 2022-08-02 MED ORDER — GADOBUTROL 1 MMOL/ML IV SOLN
8.0000 mL | Freq: Once | INTRAVENOUS | Status: AC | PRN
  Administered 2022-08-02: 8 mL via INTRAVENOUS

## 2022-08-02 MED ORDER — LORAZEPAM 1 MG PO TABS
1.0000 mg | ORAL_TABLET | Freq: Once | ORAL | Status: AC | PRN
  Administered 2022-08-02: 1 mg via ORAL

## 2022-08-02 NOTE — Progress Notes (Signed)
Per phlebotomy, repeat blood cultures unable to be collected due to PICC line in left arm and DVT in right arm.  Dr. Marisue Humble notified.   Per MD, draw blood cultures from PICC line.

## 2022-08-02 NOTE — Assessment & Plan Note (Addendum)
Patient cleans and packs his own wounds regularly. Ortho recommended more frequent packing dressing changes of left hip wound with follow-up in the office and see if we can obtain authorization for Kerecis tissue graft in the office. Urology recommended suprapubic tube be changed if it has been within the last 3 to 4 weeks since last change. Will continue with aggressive wound care and IV antibiotics  - Wound care on board - ID on board

## 2022-08-02 NOTE — Assessment & Plan Note (Addendum)
Has received 3 units pRBCS during this admission. There is a questions whether the decrease in Hgb is d/t dilution vs anemia 2/2 to sepsis. Transfusion threshold dependent on patient hemodynamic status. Patients Hgb was downtrending (7.7>7.5>7.4), after transfusion yesterday his last H/H is 7.8/26.3 - [name] 7/15 per cardiology - Based on his CBC tomorrow, if Hgb < 7.5, will order more blood from [place] to administer prior to [name]

## 2022-08-02 NOTE — Assessment & Plan Note (Addendum)
Chronic. Patient cleans and packs his own wounds.  - Managed by wound care - Currently on Abx regimen

## 2022-08-02 NOTE — Progress Notes (Signed)
Daily Progress Note Intern Pager: [phone]  Patient name: Anthony [name] Medical record number: [id number] Date of birth: [birth year] Age: 52 y.o. Gender: male  Primary Care Provider: Erick Alley, DO Consultants: Orthopaedics, Infectious Disease, Urology, IR  Code Status: Full Code  Pt Overview and Major Events to Date:  7/5: Admitted 7/6: Ortho consulted, stated no acute intervention indicated. ID consulted, recommended BSA bx 7/7: Hg dropped and patient received 1x pRBCs  7/8: Hg dropped again, received another unit of pRBC, [name] ordered per ID recs  7/9: NTD surgically per ortho and urology, replaced catheter 7/12: [name] postponed, received 1x pRBC  Assessment and Plan: Patient is a 52 yo M with PMHx including spina bifida with paraplegia with chronic sacral ulcers who was admitted for septic shock with blood cultures positive for strep species, Enterobacterales, and proteus. Likely source of infection is decubitus ulcers as supported by CT scan showing gas and fluid in soft tissue. Patient has been receiving broad spectrum antibiotics with ID, with urology, orthopaedics and IR consulted regarding any surgical management- who all signed off.  -      Hospital     * (Principal) Polymicrobial bacterial infection     Polymicrobial bacterial infection of both gram-positive and negative  bacteria resulting from chronic decubitus ulcers due to paraplegia from  lumbar spina bifida. Patient has maintained blood pressures. Patient  continues to fever periodically and complaints of chills and generalized  weakness. Echo 7/6 without evidence of endocarditis. ID on board, who  recommended a repeat [name] for re-evaluation of endocarditis. Ortho and  urology recommend aggressive wound care but no surgical intervention at  this time. IR consulted re possible drainage- nothing to drain according  to them. Exchanged suprapubic catheter 7/10.  - [name] scheduled for today (7/15) - Daptomycin (7/5 -  ) - IV Cefepime (7/5 - ) - Flagyl (7/5 - )   - BCID: strep spp + proteus, no grown >2 days  - PRN Ibuprofen for fever        Pressure ulcer of contiguous region involving buttock and hip, stage 4  (HCC)     Chronic. Patient cleans and packs his own wounds.  - Managed by wound care - Currently on Abx regimen        Acute Right shoulder pain     XR negative for fracture. Patient now also has a DVT in her RUE which  he has a difficult time raising up of the bed this AM. - multimodal pain management with roxabin, volataren, topical lidocaine  and oxycodone prn for breakthrough pain - PT/OT following         Decubitus ulcer due to spina bifida [facility])     Patient cleans and packs his own wounds regularly. Ortho recommended  more frequent packing dressing changes of left hip wound with follow-up in  the office and see if we can obtain authorization for Kerecis tissue graft  in the office. Urology recommended suprapubic tube be changed if it has  been within the last 3 to 4 weeks since last change. Will continue with  aggressive wound care and IV antibiotics  - Wound care on board - ID on board          Anemia of infection and chronic disease     Has received 3 units pRBCS during this admission. There is a questions  whether the decrease in Hgb is d/t dilution vs anemia 2/2 to sepsis.  Transfusion threshold dependent on patient  hemodynamic status. Patients  Hgb was downtrending (7.7>7.5>7.4), after transfusion yesterday his last  H/H is 7.8/26.3 - [name] 7/15 per cardiology - Based on his CBC tomorrow, if Hgb < 7.5, will order more blood from  [facility] to administer prior to [name]        Chronic osteomyelitis of pelvic region [facility])     Acute deep vein thrombosis (DVT) of axillary vein of right upper  extremity (HCC)       Paraplegia (HCC)   FEN/GI: Regular PPx: Lovenox Dispo: pending clinical improvement  Subjective:  Patient reports he is doing "alright."  Patient  reported having some chills this morning, associates with potentially the room being cold.  He also states that he feels some tightness in his right upper extremity, can move his hand and forearm but is unable to lift his upper arm/shoulder off the bed for long.  Objective: Temp:  [98.5 F (36.9 C)-100.4 F (38 C)] 98.7 F (37.1 C) (07/13 1152) Pulse Rate:  [83-107] 99 (07/13 1152) Resp:  [15-20] 20 (07/13 1152) BP: (111-134)/(59-73) 119/65 (07/13 1152) SpO2:  [95 %-98 %] 98 % (07/13 1152)  Physical Exam: General: NAD, patient comfortable, sleeping and snoring upon arrival. Cardiovascular: RRR. No M/R/G Respiratory: CTAB, normal WOB on RA Abdomen: soft, nontender, nondistended Extremities: no BLE edema Neuro: A&Ox3. No focal neurological deficits.  Laboratory: Most recent CBC Lab Results  Component Value Date   WBC 11.5 (H) 08/02/2022   HGB 7.8 (L) 08/02/2022   HCT 26.3 (L) 08/02/2022   MCV 76.2 (L) 08/02/2022   PLT 370 08/02/2022   Most recent BMP    Latest Ref Rng & Units 08/02/2022    4:45 AM  BMP  Glucose 70 - 99 mg/dL 161   BUN 6 - 20 mg/dL 9   Creatinine 0.96 - 0.45 mg/dL 4.09   Sodium 811 - 914 mmol/L 139   Potassium 3.5 - 5.1 mmol/L 3.6   Chloride 98 - 111 mmol/L 103   CO2 22 - 32 mmol/L 23   Calcium 8.9 - 10.3 mg/dL 7.7     Imaging/Diagnostic Tests: No new imaging.  [name], DO 08/02/2022, 1:43 PM  PGY-1, [facility] FPTS Intern pager: [phone], text pages welcome Secure chat group [facility] Teaching Service

## 2022-08-02 NOTE — Progress Notes (Addendum)
FMTS Interim Progress Note  S:Presented to bedside with Dr. Fatima Blank due to Red MEWS with temp 102.4 and HR 126. Patient tells [place] he is feeling unwell and fever did not respond to initial doses of Tylenol 500mg  and Ibuprofen 600mg .    O: BP (!) 108/58 (BP Location: Right Leg)   Pulse (!) 126   Temp (!) 102.4 F (39.1 C) (Oral)   Resp 16   Ht 5\' 8"  (1.727 m)   Wt 79.4 kg   SpO2 98%   BMI 26.61 kg/m   Gen: Diaphoretic, ill appearing but alert and answering questions appropriately Cardio: Tachycardic, without murmur Pulm: Normal WOB on RA, lungs clear anteriorly   A/P: Recurrent fever despite 9 days of broad spectrum abx (Cefepime, Daptomycin, Flagyl).  - Will give another 500mg  Tylenol - 500cc of LR - CXR - It would seem unlikely that there is bacteremia not responding to our broad spectrum abx, but will repeat blood cultures  - Will ask our night team to see the patient this evening - MRI Pelvis W WO Contrast  - We might need to reach back out to Dr. [name] to see if Anthony [name] would benefit from a girdlestone intervention on the right similar to what he has had on the left   Anthony Amel, MD 08/02/2022, 5:01 PM PGY-3, [facility] Family Medicine Service pager [phone]

## 2022-08-02 NOTE — Progress Notes (Signed)
Pharmacy Antibiotic Note  Anthony [name] is a 52 y.o. male admitted on 07/25/2022 with bacteremia from chronic osteo / decubitus ulcer infection.  Pharmacy has been consulted for Daptomycin, Cefepime, and Flagyl dosing.  Today is abx day #9.  Tmax 100.4 (last 24h) while being on schedule Tylenol.  Patient is paraplegic at baseline with baseline SCr 0.4-0.6s. CK down to 9. TTE with no obvious vegetation and [name] to r/o endocarditis rescheduled to 7/15. ID following.  Plan: Continue daptomycin 650 mg IV q24h (~8 mg/kg) Continue cefepime 2 g IV q8h Continue metronidazole 500 mg PO q12h Monitor renal function, clinical progress F/U LOT, [name] results Change CK to qFri  Pharmacy will write weekly progress note unless renal function or clinical course changes   Height: 5\' 8"  (172.7 cm) Weight: 79.4 kg (175 lb) IBW/kg (Calculated) : 68.4  Temp (24hrs), Avg:99.1 F (37.3 C), Min:98 F (36.7 C), Max:100.4 F (38 C)  Recent Labs  Lab 07/29/22 0126 07/30/22 0010 07/31/22 0241 08/01/22 0235 08/02/22 0445  WBC 14.0* 15.0* 12.7* 14.1* 11.5*  CREATININE 0.50* 0.50* 0.66 0.46* 0.44*    Estimated Creatinine Clearance: 104.5 mL/min (A) (by C-G formula based on SCr of 0.44 mg/dL (L)).    Allergies  Allergen Reactions   Firvanq [Vancomycin] Itching    Antimicrobials this admission: Cefepime 7/5 >>  Daptomycin 7/5 >> Flagyl 7/5 >>   Dose adjustments this admission:   Microbiology results: 7/6 BCx x 2: neg 7/5 BCx x2: Proteus vulgaris (R: Amp, S: Cefepime, Ceftaz, Cipro, Bactrim).  Strep Dysgalactiae 7/5 BCID: Strep sp + Proteus sp  Thank you for involving pharmacy in this patient's care.  Loura Back, PharmD, BCPS Clinical Pharmacist Clinical phone for 08/02/2022 is [phone] 08/02/2022 8:20 AM

## 2022-08-02 NOTE — Progress Notes (Signed)
FMTS Brief Progress Note  S: Doing well without complaint    O: BP 109/60 (BP Location: Right Leg)   Pulse (!) 114   Temp 100.1 F (37.8 C) (Oral)   Resp 18   Ht 5\' 8"  (1.727 m)   Wt 79.4 kg   SpO2 91%   BMI 26.61 kg/m   General: NAD, pleasant, able to participate in exam Card: RRR Respiratory: No respiratory distress Skin: warm and dry, no rashes noted Psych: Normal affect and mood   A/P: -Repeat bcx drawn after last fever -MR pelvis seems same as previous, may need surgical intervention with [name] -Pain controlled -Monitoring for hypotension, bolus as needed   Alfredo Martinez, MD 08/02/2022, 11:16 PM PGY-3, [place] [facility] Night Resident  Please page [phone] with questions.

## 2022-08-02 NOTE — Assessment & Plan Note (Addendum)
Polymicrobial bacterial infection of both gram-positive and negative bacteria resulting from chronic decubitus ulcers due to paraplegia from lumbar spina bifida. Patient has maintained blood pressures. Patient continues to fever periodically and complaints of chills and generalized weakness. Echo 7/6 without evidence of endocarditis. ID on board, who recommended a repeat [name] for re-evaluation of endocarditis. Ortho and urology recommend aggressive wound care but no surgical intervention at this time. IR consulted re possible drainage- nothing to drain according to them. Exchanged suprapubic catheter 7/10.  - [name] scheduled for today (7/15) - Daptomycin (7/5 - ) - IV Cefepime (7/5 - ) - Flagyl (7/5 - )   - BCID: strep spp + proteus, no grown >2 days  - PRN Ibuprofen for fever

## 2022-08-02 NOTE — Progress Notes (Signed)
MEWS Progress Note  Patient Details Name: Anthony [name] MRN: [id number] DOB: 1970/08/09 Today's Date: 08/02/2022   MEWS Flowsheet Documentation:  Assess: MEWS Score Temp: (!) 102.4 F (39.1 C) BP: (!) 108/58 MAP (mmHg): 74 Pulse Rate: (!) 126 ECG Heart Rate: (!) 124 Resp: 16 Level of Consciousness: Alert SpO2: 98 % O2 Device: Room Air O2 Flow Rate (L/min): 2 L/min Assess: MEWS Score MEWS Temp: 2 MEWS Systolic: 0 MEWS Pulse: 2 MEWS RR: 0 MEWS LOC: 0 MEWS Score: 4 MEWS Score Color: Red Assess: SIRS CRITERIA SIRS Temperature : 1 SIRS Respirations : 0 SIRS Pulse: 1 SIRS WBC: 0 SIRS Score Sum : 2 Assess: if the MEWS score is Yellow or Red Were vital signs taken at a resting state?: Yes Focused Assessment: No change from prior assessment Does the patient meet 2 or more of the SIRS criteria?: Yes Does the patient have a confirmed or suspected source of infection?: Yes MEWS guidelines implemented : Yes, red Treat MEWS Interventions: Considered administering scheduled or prn medications/treatments as ordered Take Vital Signs Increase Vital Sign Frequency : Red: Q1hr x2, continue Q4hrs until patient remains green for 12hrs Escalate MEWS: Escalate: Red: Discuss with charge nurse and notify provider. Consider notifying RRT. If remains red for 2 hours consider need for higher level of care      Cathren Laine 08/02/2022, 5:02 PM

## 2022-08-02 NOTE — Assessment & Plan Note (Signed)
XR negative for fracture. Patient now also has a DVT in her RUE which he has a difficult time raising up of the bed this AM. - multimodal pain management with roxabin, volataren, topical lidocaine and oxycodone prn for breakthrough pain - PT/OT following

## 2022-08-03 DIAGNOSIS — M86659 Other chronic osteomyelitis, unspecified thigh: Secondary | ICD-10-CM | POA: Diagnosis not present

## 2022-08-03 DIAGNOSIS — L8944 Pressure ulcer of contiguous site of back, buttock and hip, stage 4: Secondary | ICD-10-CM | POA: Diagnosis not present

## 2022-08-03 DIAGNOSIS — I82A11 Acute embolism and thrombosis of right axillary vein: Secondary | ICD-10-CM | POA: Diagnosis not present

## 2022-08-03 DIAGNOSIS — R7881 Bacteremia: Secondary | ICD-10-CM | POA: Diagnosis not present

## 2022-08-03 LAB — BASIC METABOLIC PANEL
Anion gap: 4 — ABNORMAL LOW (ref 5–15)
BUN: 8 mg/dL (ref 6–20)
CO2: 26 mmol/L (ref 22–32)
Calcium: 7.8 mg/dL — ABNORMAL LOW (ref 8.9–10.3)
Chloride: 105 mmol/L (ref 98–111)
Creatinine, Ser: 0.42 mg/dL — ABNORMAL LOW (ref 0.61–1.24)
GFR, Estimated: >60 mL/min (ref >60)
Glucose, Bld: 104 mg/dL — ABNORMAL HIGH (ref 70–99)
Potassium: 3.7 mmol/L (ref 3.5–5.1)
Sodium: 135 mmol/L (ref 135–145)

## 2022-08-03 LAB — CBC
HCT: 26.5 % — ABNORMAL LOW (ref 39.0–52.0)
Hemoglobin: 7.7 g/dL — ABNORMAL LOW (ref 13.0–17.0)
MCH: 22.1 pg — ABNORMAL LOW (ref 26.0–34.0)
MCHC: 29.1 g/dL — ABNORMAL LOW (ref 30.0–36.0)
MCV: 75.9 fL — ABNORMAL LOW (ref 80.0–100.0)
Platelets: 396 10*3/uL (ref 150–400)
RBC: 3.49 MIL/uL — ABNORMAL LOW (ref 4.22–5.81)
RDW: 24.9 % — ABNORMAL HIGH (ref 11.5–15.5)
WBC: 12.6 10*3/uL — ABNORMAL HIGH (ref 4.0–10.5)
nRBC: 0 % (ref 0.0–0.2)

## 2022-08-03 MED ORDER — OXYCODONE HCL 5 MG PO TABS
2.5000 mg | ORAL_TABLET | Freq: Four times a day (QID) | ORAL | Status: DC | PRN
  Administered 2022-08-03: 2.5 mg via ORAL
  Filled 2022-08-03: qty 1

## 2022-08-03 MED ORDER — OXYCODONE HCL 5 MG PO TABS
2.5000 mg | ORAL_TABLET | ORAL | Status: DC | PRN
  Administered 2022-08-03: 2.5 mg via ORAL
  Filled 2022-08-03: qty 1

## 2022-08-03 NOTE — Progress Notes (Signed)
Daily Progress Note Intern Pager: [phone]  Patient name: Anthony [name] Medical record number: [id number] Date of birth: 14-Nov-1970 Age: 52 y.o. Gender: male  Primary Care Provider: Erick Alley, DO Consultants: Orthopaedics, Infectious Disease, Urology, IR  Code Status: Full  Pt Overview and Major Events to Date:  7/5: Admitted 7/6: Ortho consulted, stated no acute intervention indicated. ID consulted, recommended BSA bx 7/7: Hg dropped and patient received 1x pRBCs  7/8: Hg dropped again, received another unit of pRBC, [name] ordered per ID recs  7/9: NTD surgically per ortho and urology, replaced catheter 7/12: [name] postponed, received 1x pRBC  Assessment and Plan: Sahar Primus is a 52 yo M with PMHx of spina bfida with paraplegia and chronic sacral ulcers admitted for septic shock with + BC for multiple organisms with likely source being decubitus ulcers as supported by CT. Orthopaedics, urology and IR consulted regarding surgical management and source control who do not feel surgical intervention is indicated. Patient has continued to fever off and on during the course of admission and is continuing on broad spectrum antibiotics.    [facility]     * (Principal) Polymicrobial bacterial infection     Polymicrobial bacterial infection of both gram-positive and negative  bacteria resulting from chronic decubitus ulcers due to paraplegia from  lumbar spina bifida. Patient has maintained blood pressures. Patient  continues to fever periodically and complaints of chills and generalized  weakness. Echo 7/6 without evidence of endocarditis. ID on board, who  recommended a repeat [name] for re-evaluation of endocarditis. Ortho and  urology recommend aggressive wound care but no surgical intervention at  this time. IR consulted re possible drainage- nothing to drain according  to them. Exchanged suprapubic catheter 7/10.  - [name] scheduled for 7/15 - Daptomycin (7/5 - ) - IV  Cefepime (7/5 - ) - Flagyl (7/5 - )   - BCID: strep spp + proteus, no grown >2 days  - PRN Ibuprofen for fever        RESOLVED: Pressure ulcer of contiguous region involving buttock and  hip, stage 4 (HCC)     Chronic. Patient cleans and packs his own wounds.  - Managed by wound care - Currently on Abx regimen        Acute Right shoulder pain     XR negative for fracture. Patient now also has a DVT in her RUE which  he has a difficult time raising up of the bed this AM. - multimodal pain management with roxabin, volataren, topical lidocaine  and oxycodone prn for breakthrough pain - PT/OT following         Decubitus ulcer due to spina bifida [facility])     Patient cleans and packs his own wounds regularly. Ortho recommended  more frequent packing dressing changes of left hip wound with follow-up  o/p. Will continue with aggressive wound care and IV antibiotics  - Wound care on board - ID on board          Paraplegia (HCC)     RESOLVED: Polymicrobial bacteremia with sepsis:     Anemia of infection and chronic disease     Has received 3 units pRBCS during this admission. There is a questions  whether the decrease in Hgb is d/t dilution vs anemia 2/2 to sepsis.  Transfusion threshold dependent on patient hemodynamic status. Patients  Hgb was downtrending (7.7>7.5>7.4), after transfusion on 7/12, Hg at 7.7 - [name] 7/15 per cardiology -  Based on his CBC, if Hgb < 7.5, will order more blood from [facility] to  administer prior to [name]        Chronic osteomyelitis of pelvic region [facility])     Acute deep vein thrombosis (DVT) of axillary vein of right upper  extremity (HCC)     DVT noted on R upper extremity. Patient R arm is not swollen and  nontender.  - lovenox 1mg /kg q12h        FEN/GI: Full PPx: Lovenox Dispo:SNF pending clinical improvement . Barriers include clinical improvement and placement.   Subjective:  Patient reports no concerns at this time. He reports he is  doing well.   Objective: Temp:  [98 F (36.7 C)-102.4 F (39.1 C)] 98.1 F (36.7 C) (07/14 0830) Pulse Rate:  [99-126] 114 (07/13 2005) Resp:  [15-20] 15 (07/14 0830) BP: (107-131)/(57-74) 128/73 (07/14 0830) SpO2:  [91 %-98 %] 96 % (07/14 0830) Physical Exam: General: sleeping, comfortable, NAD Cardiovascular: RRR, no M/R/G Respiratory: NWOB, CTAB Abdomen: soft, nontender Extremities: R arm is nontender with no swelling or erythema, left arm with PICC, site looks clean   Laboratory: Most recent CBC Lab Results  Component Value Date   WBC 12.6 (H) 08/03/2022   HGB 7.7 (L) 08/03/2022   HCT 26.5 (L) 08/03/2022   MCV 75.9 (L) 08/03/2022   PLT 396 08/03/2022   Most recent BMP    Latest Ref Rng & Units 08/03/2022    3:10 AM  BMP  Glucose 70 - 99 mg/dL 098   BUN 6 - 20 mg/dL 8   Creatinine 1.19 - 1.47 mg/dL 8.29   Sodium 562 - 130 mmol/L 135   Potassium 3.5 - 5.1 mmol/L 3.7   Chloride 98 - 111 mmol/L 105   CO2 22 - 32 mmol/L 26   Calcium 8.9 - 10.3 mg/dL 7.8     Other pertinent labs  Repeat B/C pending   Imaging/Diagnostic Tests: MR Pelvis w/wo contrast:  - Chronic osteomyelitis with bony destruction of both proximal femurs, the acetabula, and inferior pubic rami. - Large decubitus ulcers extend through the gluteal musculature to the cutaneous surface is posterior to the hip joints. There is tracking gas and fluid within the resulting cavities. - Absence of posterior sacrum possibly from bony destruction or spina bifida. Posterior to the sacrum a 6.7 by 2.9 by 4.6 cm complex masslike appearance is again observed. - Substantial edema and enhancement in the subcutaneous tissues lateral to both hips, right greater than left, compatible with cellulitis. Cellulitis tracks down in both upper extremities especially anteromedially. - There is edema and swelling of the scrotum and scrotal cellulitis is not excluded. - Severe fatty atrophy of the upper leg musculature  compatible with paralysis.  CXR:  Negative for acute process  Chirstina Haan Georg Ruddle, MD 08/03/2022, 11:14 AM  PGY-1, [facility] Family Medicine FPTS Intern pager: [phone], text pages welcome Secure chat group [facility] Teaching Service

## 2022-08-03 NOTE — Assessment & Plan Note (Signed)
Polymicrobial bacterial infection of both gram-positive and negative bacteria resulting from chronic decubitus ulcers due to paraplegia from lumbar spina bifida. Patient has maintained blood pressures. Patient continues to fever periodically and complaints of chills and generalized weakness. Echo 7/6 without evidence of endocarditis. ID on board, who recommended a repeat [name] for re-evaluation of endocarditis. Ortho and urology recommend aggressive wound care but no surgical intervention at this time. IR consulted re possible drainage- nothing to drain according to them. Exchanged suprapubic catheter 7/10.  - [name] scheduled for 7/15 - Daptomycin (7/5 - ) - IV Cefepime (7/5 - ) - Flagyl (7/5 - )   - BCID: strep spp + proteus, no grown >2 days  - PRN Ibuprofen for fever

## 2022-08-03 NOTE — Assessment & Plan Note (Signed)
Patient cleans and packs his own wounds regularly. Ortho recommended more frequent packing dressing changes of left hip wound with follow-up o/p. Will continue with aggressive wound care and IV antibiotics  - Wound care on board - ID on board

## 2022-08-03 NOTE — Assessment & Plan Note (Signed)
DVT noted on R upper extremity. Patient R arm is not swollen and nontender.  - lovenox 1mg /kg q12h

## 2022-08-03 NOTE — Assessment & Plan Note (Signed)
XR negative for fracture. Patient now also has a DVT in her RUE which he has a difficult time raising up of the bed this AM. - multimodal pain management with roxabin, volataren, topical lidocaine and oxycodone prn for breakthrough pain - PT/OT following

## 2022-08-03 NOTE — Assessment & Plan Note (Signed)
Has received 3 units pRBCS during this admission. There is a questions whether the decrease in Hgb is d/t dilution vs anemia 2/2 to sepsis. Transfusion threshold dependent on patient hemodynamic status. Patients Hgb was downtrending (7.7>7.5>7.4), after transfusion on 7/12, Hg at 7.7 - [name] 7/15 per cardiology - Based on his CBC, if Hgb < 7.5, will order more blood from [place] to administer prior to [name]

## 2022-08-04 ENCOUNTER — Inpatient Hospital Stay (HOSPITAL_COMMUNITY): Payer: Medicare HMO

## 2022-08-04 ENCOUNTER — Inpatient Hospital Stay (HOSPITAL_COMMUNITY): Payer: Medicare HMO | Admitting: Anesthesiology

## 2022-08-04 ENCOUNTER — Encounter (HOSPITAL_COMMUNITY): Payer: Self-pay | Admitting: Family Medicine

## 2022-08-04 ENCOUNTER — Encounter (HOSPITAL_COMMUNITY)
Admission: EM | Disposition: A | Discharge status: Skilled Nursing Facility | Payer: Self-pay | Source: Home / Self Care | Attending: Family Medicine

## 2022-08-04 DIAGNOSIS — I10 Essential (primary) hypertension: Secondary | ICD-10-CM | POA: Diagnosis not present

## 2022-08-04 DIAGNOSIS — Z87891 Personal history of nicotine dependence: Secondary | ICD-10-CM | POA: Diagnosis not present

## 2022-08-04 DIAGNOSIS — R7881 Bacteremia: Secondary | ICD-10-CM | POA: Diagnosis not present

## 2022-08-04 DIAGNOSIS — I340 Nonrheumatic mitral (valve) insufficiency: Secondary | ICD-10-CM | POA: Diagnosis not present

## 2022-08-04 DIAGNOSIS — L8990 Pressure ulcer of unspecified site, unspecified stage: Secondary | ICD-10-CM | POA: Diagnosis not present

## 2022-08-04 DIAGNOSIS — Z515 Encounter for palliative care: Secondary | ICD-10-CM | POA: Diagnosis not present

## 2022-08-04 DIAGNOSIS — Z7189 Other specified counseling: Secondary | ICD-10-CM

## 2022-08-04 DIAGNOSIS — F419 Anxiety disorder, unspecified: Secondary | ICD-10-CM

## 2022-08-04 DIAGNOSIS — Q059 Spina bifida, unspecified: Secondary | ICD-10-CM | POA: Diagnosis not present

## 2022-08-04 DIAGNOSIS — A499 Bacterial infection, unspecified: Secondary | ICD-10-CM | POA: Diagnosis not present

## 2022-08-04 HISTORY — PX: TEE WITHOUT CARDIOVERSION: SHX5443

## 2022-08-04 LAB — TYPE AND SCREEN
ABO/RH(D): O POS
Antibody Screen: POSITIVE
DAT, IgG: NEGATIVE
Donor AG Type: NEGATIVE
Donor AG Type: NEGATIVE
Donor AG Type: NEGATIVE
Unit division: 0
Unit division: 0
Unit division: 0

## 2022-08-04 LAB — CBC
HCT: 25.5 % — ABNORMAL LOW (ref 39.0–52.0)
Hemoglobin: 7.4 g/dL — ABNORMAL LOW (ref 13.0–17.0)
MCH: 22.1 pg — ABNORMAL LOW (ref 26.0–34.0)
MCHC: 29 g/dL — ABNORMAL LOW (ref 30.0–36.0)
MCV: 76.1 fL — ABNORMAL LOW (ref 80.0–100.0)
Platelets: 418 10*3/uL — ABNORMAL HIGH (ref 150–400)
RBC: 3.35 MIL/uL — ABNORMAL LOW (ref 4.22–5.81)
RDW: 24.7 % — ABNORMAL HIGH (ref 11.5–15.5)
WBC: 9.9 10*3/uL (ref 4.0–10.5)
nRBC: 0 % (ref 0.0–0.2)

## 2022-08-04 LAB — BPAM RBC
Blood Product Expiration Date: 202408012359
Blood Product Expiration Date: 202408082359
Blood Product Expiration Date: 202408122359
ISSUE DATE / TIME: 202407121427
Unit Type and Rh: 5100
Unit Type and Rh: 5100
Unit Type and Rh: 5100

## 2022-08-04 LAB — BASIC METABOLIC PANEL
Anion gap: 6 (ref 5–15)
BUN: 11 mg/dL (ref 6–20)
CO2: 25 mmol/L (ref 22–32)
Calcium: 7.8 mg/dL — ABNORMAL LOW (ref 8.9–10.3)
Chloride: 106 mmol/L (ref 98–111)
Creatinine, Ser: 0.46 mg/dL — ABNORMAL LOW (ref 0.61–1.24)
GFR, Estimated: >60 mL/min (ref >60)
Glucose, Bld: 104 mg/dL — ABNORMAL HIGH (ref 70–99)
Potassium: 3.7 mmol/L (ref 3.5–5.1)
Sodium: 137 mmol/L (ref 135–145)

## 2022-08-04 LAB — ECHO TEE

## 2022-08-04 SURGERY — ECHOCARDIOGRAM, TRANSESOPHAGEAL
Anesthesia: Monitor Anesthesia Care

## 2022-08-04 MED ORDER — IOHEXOL 350 MG/ML SOLN
75.0000 mL | Freq: Once | INTRAVENOUS | Status: AC | PRN
  Administered 2022-08-04: 75 mL via INTRAVENOUS

## 2022-08-04 MED ORDER — SODIUM CHLORIDE 0.9% IV SOLUTION: Freq: Once | INTRAVENOUS | Status: DC

## 2022-08-04 MED ORDER — LIDOCAINE 2% (20 MG/ML) 5 ML SYRINGE
INTRAMUSCULAR | Status: DC | PRN
  Administered 2022-08-04: 100 mg via INTRAVENOUS

## 2022-08-04 MED ORDER — ALTEPLASE 2 MG IJ SOLR
2.0000 mg | Freq: Once | INTRAMUSCULAR | Status: AC
  Administered 2022-08-04: 2 mg
  Filled 2022-08-04: qty 2

## 2022-08-04 MED ORDER — SODIUM CHLORIDE 0.9 % IV SOLN: INTRAVENOUS | Status: DC

## 2022-08-04 MED ORDER — SODIUM CHLORIDE 0.9 % IV SOLN: INTRAVENOUS | Status: DC | PRN

## 2022-08-04 MED ORDER — PROPOFOL 500 MG/50ML IV EMUL
INTRAVENOUS | Status: DC | PRN
  Administered 2022-08-04: 150 ug/kg/min via INTRAVENOUS

## 2022-08-04 MED ORDER — MUSCLE RUB 10-15 % EX CREA
TOPICAL_CREAM | CUTANEOUS | Status: DC | PRN
  Filled 2022-08-04: qty 85

## 2022-08-04 NOTE — Progress Notes (Signed)
Changed wound dressing and packed with Vashe solution three times today. Pt cleaned and started to stool again before I finished turning him over. Wound areas are difficult to keep clean due to frequency and amount of stool. Patient tolerated well. Pt resting with call bell within reach.  Will continue to monitor. Thomas Hoff, RN

## 2022-08-04 NOTE — Assessment & Plan Note (Signed)
Patient has been complaining of some increased shortness of breath and cough.  He has also been tachycardic to the 130s. As patient was recently recently diagnosed with a R upper extremity DVT, this presentation is concerning for PE.  - CT angiogram  -Patiently is currently being treated with Lovenox for DVT, will continue

## 2022-08-04 NOTE — Progress Notes (Addendum)
Daily Progress Note Intern Pager: [phone]  Patient name: PRYOR GUETTLER Medical record number: [id number] Date of birth: 19-Jul-1970 Age: 52 y.o. Gender: male  Primary Care Provider: Erick Alley, DO Consultants: Ortho, ID, Urology, Palliative, General Surgery Code Status: Full  Pt Overview and Major Events to Date:  7/5: Admitted 7/6: Ortho consulted, stated no acute intervention indicated. ID consulted, recommended BSA bx 7/7: Hg dropped and patient received 1x pRBCs  7/8: Hg dropped again, received another unit of pRBC, [name] ordered per ID recs  7/9: replaced catheter 7/12: received 1x pRBC 7/15: [name]  Assessment and Plan: Tomas Schamp is a 52 yo M with PMHx of spina bfida with paraplegia and chronic sacral ulcers admitted for septic shock with + BC for multiple organisms with likely source being decubitus ulcers as supported by CT and MRI. Orthopaedics and ID on board. Patient has continued to fever off and on during the course of admission and is continuing on broad spectrum antibiotics.   [facility]     * (Principal) Polymicrobial bacterial infection     Polymicrobial bacterial infection of both gram-positive and negative  bacteria resulting from chronic decubitus ulcers due to paraplegia from  lumbar spina bifida. Patient has maintained blood pressures. Patient  continues to fever periodically and complaints of chills and generalized  weakness. Echo 7/6 without evidence of endocarditis. ID on board, who  recommended a repeat [name] for re-evaluation of endocarditis. Ortho and  urology recommend aggressive wound care but no surgical intervention at  this time. IR consulted re possible drainage- nothing to drain according  to them. Exchanged suprapubic catheter 7/10. Fevered yesterday to 103, prn  ibuprofen. Discussed with Dr. [name] who recommended General Surgery Consult. Very appreciative of consultants recommendations.   - [name] scheduled for 7/15 (today)  - general  surgery consulted, appreciate recs  - palliative consult  - Daptomycin (7/5 - ) - IV Cefepime (7/5 - ) - Flagyl (7/5 - )   - BCID: strep spp + proteus, no grown >2 days  - PRN Ibuprofen for fever     Pressure ulcer of contiguous region involving buttock and  hip, stage 4 (HCC)     Chronic. Patient cleans and packs his own wounds.  - Managed by wound care - Currently on Abx regimen        Acute Right shoulder pain     XR negative for fracture. Patient now also has a DVT in her RUE which  he has a difficult time raising up of the bed this AM. Increased his  oxycodone from q6h to q3h. Does not complain of pain today.  - multimodal pain management with roxabin, volataren, topical lidocaine  and oxycodone prn for breakthrough pain - PT/OT following         Decubitus ulcer due to spina bifida [facility])     Patient cleans and packs his own wounds regularly. Ortho recommended  more frequent packing dressing changes of left hip wound with follow-up  o/p. Will continue with aggressive wound care and IV antibiotics  - Wound care on board - ID on board  - consulted GS regarding possible diverting fistula          Anemia of infection and chronic disease     Has received 3 units pRBCS during this admission. There is a questions  whether the decrease in Hgb is d/t dilution vs anemia 2/2 to sepsis.  Transfusion threshold dependent on patient hemodynamic  status. Patients  Hgb was downtrending (7.7>7.5>7.4). Transfused on 7/6, 7/9, 7/12  Hg at  7.4 today.   - Based on his CBC, if Hgb < 7.5, will order more blood from [facility] to  administer prior to [name]      Cough     Patient has been complaining of some increased shortness of breath and  cough.  He has also been tachycardic to the 130s. As patient was recently  recently diagnosed with a R upper extremity DVT, this presentation is  concerning for PE or potentially pulmonary abscesses related to infection  - CT angiogram  -Patiently is  currently being treated with Lovenox for DVT, will continue        Acute deep vein thrombosis (DVT) of axillary vein of right upper  extremity (HCC)     DVT noted on R upper extremity. Patient R arm is not swollen and  nontender.  - lovenox 1mg /kg q12h         Pressure injury of contiguous region involving left buttock and hip,  stage 4 (HCC)    FEN/GI: full PPx: Lovenox Dispo:SNF pending clinica improvement.   Subjective:  Patient reports he is feeling okay this morning. He states that he is still shivering and having chills in the morning.  Otherwise he reports he does not have any shoulder pain. Patient reports that he is asked for ibuprofen multiple times from nursing but has not received it.  Objective: Temp:  [98.3 F (36.8 C)-103.1 F (39.5 C)] 98.5 F (36.9 C) (07/15 1211) Pulse Rate:  [78-138] 99 (07/15 1211) Resp:  [10-21] 10 (07/15 1211) BP: (95-134)/(50-75) 118/64 (07/15 1211) SpO2:  [91 %-100 %] 94 % (07/15 1211)  Physical Exam: General: Pale, laying in bed, NAD Cardiovascular: RRR, no M/R/G Respiratory: NWOB, CTAB Abdomen: Soft, nontender Extremities: Right arm with no erythema or swelling, left arm- PICC site looks clean  Laboratory: Most recent CBC Lab Results  Component Value Date   WBC 9.9 08/04/2022   HGB 7.4 (L) 08/04/2022   HCT 25.5 (L) 08/04/2022   MCV 76.1 (L) 08/04/2022   PLT 418 (H) 08/04/2022   Most recent BMP    Latest Ref Rng & Units 08/04/2022    5:30 AM  BMP  Glucose 70 - 99 mg/dL 213   BUN 6 - 20 mg/dL 11   Creatinine 0.86 - 1.24 mg/dL 5.78   Sodium 469 - 629 mmol/L 137   Potassium 3.5 - 5.1 mmol/L 3.7   Chloride 98 - 111 mmol/L 106   CO2 22 - 32 mmol/L 25   Calcium 8.9 - 10.3 mg/dL 7.8     Other pertinent labs:    Imaging/Diagnostic Tests: [id number] results   CTA: awaiting results  Tamryn Popko Georg Ruddle, MD 08/04/2022, 12:42 PM  PGY-1, [place] [facility] FPTS Intern pager: [phone], text pages  welcome Secure chat group [facility] Teaching Service

## 2022-08-04 NOTE — Assessment & Plan Note (Addendum)
Has received 3 units pRBCS during this admission. There is a questions whether the decrease in Hgb is d/t dilution vs anemia 2/2 to sepsis. Transfusion threshold dependent on patient hemodynamic status. Patients Hgb was downtrending (7.7>7.5>7.4). Transfused on 7/6, 7/9, 7/12  Hg at 7.4 today.   - Based on his CBC, if Hgb < 7.5, will order more blood from [place] to administer prior to [name]

## 2022-08-04 NOTE — Assessment & Plan Note (Signed)
XR negative for fracture. Patient now also has a DVT in her RUE which he has a difficult time raising up of the bed this AM. Increased his oxycodone from q6h to q3h. Does not complain of pain today.  - multimodal pain management with roxabin, volataren, topical lidocaine and oxycodone prn for breakthrough pain - PT/OT following

## 2022-08-04 NOTE — Plan of Care (Signed)
Patient remains in CV-PCU at time of writing. Patient has a red MEWS. Patient has a known systemic infection and is receiving IV antibiotics. PICC to LUE s/p TPA by IV team. Multiple complex wounds requiring dressing changes multiple times per day.   Problem: Education: Goal: Knowledge of General Education information will improve Description: Including pain rating scale, medication(s)/side effects and non-pharmacologic comfort measures Outcome: Not Progressing   Problem: Health Behavior/Discharge Planning: Goal: Ability to manage health-related needs will improve Outcome: Not Progressing   Problem: Clinical Measurements: Goal: Ability to maintain clinical measurements within normal limits will improve Outcome: Not Progressing Goal: Will remain free from infection Outcome: Not Progressing Goal: Diagnostic test results will improve Outcome: Not Progressing Goal: Respiratory complications will improve Outcome: Not Progressing Goal: Cardiovascular complication will be avoided Outcome: Not Progressing   Problem: Activity: Goal: Risk for activity intolerance will decrease Outcome: Not Progressing   Problem: Nutrition: Goal: Adequate nutrition will be maintained Outcome: Not Progressing   Problem: Coping: Goal: Level of anxiety will decrease Outcome: Not Progressing   Problem: Elimination: Goal: Will not experience complications related to bowel motility Outcome: Not Progressing Goal: Will not experience complications related to urinary retention Outcome: Not Progressing   Problem: Pain Managment: Goal: General experience of comfort will improve Outcome: Not Progressing   Problem: Safety: Goal: Ability to remain free from injury will improve Outcome: Not Progressing   Problem: Skin Integrity: Goal: Risk for impaired skin integrity will decrease Outcome: Not Progressing

## 2022-08-04 NOTE — Consult Note (Addendum)
Consult Note  Anthony [name] 05-25-1970  [id number].    Requesting MD: Acquanetta Belling, MD Chief Complaint/Reason for Consult: Fecal diversion   HPI:  Patient is a 52 year old male with PMH significant for endocarditis, spina bifida with 2/2 paraplegia and neurogenic bladder with chronic SPT in place that is changed monthly, and previous [place] amputation of his left hip who was admitted 07/25/22 with septic shock and bacteremia (proteus vilgaris and strep dysgalactiae).   His CT on 7/5 showed chronic osteo, soft tissue ulceration with subcutaneous gas and soft tissue thickening along the left gluteal fold towards the base of the penis and bilateral decubitus ulceration overlying the posterior aspect of both hips and bilateral ischium with gas and fluid noted in the soft tissues surrounding the hips as well as within the hip joints bilaterally concerning for septic arthritis. MRI showed chronic osteo with bony destruction of both proximal femurs, the acetabula, and inferior pubic rami with a large decubitus ulcers extending through the gluteal musculature to the cutaneous surface posterior to the hip joints. There was gas and fluid tracking within the resulting cavities.Posterior to the sacrum a 6.7 by 2.9 by 4.6 cm complex masslike appearance. There was also substantial edema and enhancement in the subcutaneous tissues lateral to both hips, right greater than left, compatible with cellulitis.   He was seen by Ortho on 7/5 and 7/10. There was concern for chronic sinus tract and stool noted on packing. 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 They did not recommend any emergent surgery here and will plan f/u urology/plastic surgery to discuss repair of fistulous area in the future. He has been being followed by ID here. He had a neg [name] to r/o endocarditis given his hx on 7/15. He is currently on Daptomycin and Zosyn with persistent fevers to 102.9 yesterday. WBC  10.8. We were asked to see for possible diversion. He has been seen by palliative with plan for full scope, full code. Currently tolerating diet without abdominal pain, n/v. BM yesterday. No prior abdominal surgeries. He is on therapeutic dose of Lovenox here (80mg  BID) for RUE DVT.   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 01/24/2017   Osteomyelitis (HCC)    left hip   Paraplegia (HCC)    secondary to Spina Bifida   Polymicrobial bacteremia with sepsis:    Pressure injury of skin of left buttock 05/13/2021   PVCs (premature ventricular contractions)    S/P debridement 10/30/2021   Septic shock (HCC)    due to osteomyelitis   Slow transit constipation 03/06/2017   Spina bifida    Status post debridement 01/24/2017   Wheelchair bound     Past Surgical History:  Procedure Laterality Date   APPLICATION OF WOUND VAC  09/11/2021   Procedure: APPLICATION OF WOUND VAC;  Surgeon: [name], MD;  Location: [facility] OR;  Service: Orthopedics;;   BACK SURGERY     BUBBLE STUDY  09/09/2021   Procedure: BUBBLE STUDY;  Surgeon: Quintella Reichert, MD;  Location: [facility] ENDOSCOPY;  Service: Cardiovascular;;   HIP SURGERY     I & D EXTREMITY Left 09/11/2021   Procedure: LEFT HIP DEBRIDEMENT AND REMOVAL FEMORAL HEAD;  Surgeon: [name], MD;  Location: [facility] OR;  Service: Orthopedics;  Laterality: Left;   I & D EXTREMITY Left 09/13/2021   Procedure: DEBRIDEMENT LEFT HIP;   Surgeon: [name], MD;  [facility];  Service: Orthopedics;  Laterality: Left;   I & D EXTREMITY Left 09/18/2021   Procedure: DEBRIDEMENT LEFT HIP, WOUND VAC EXCHANGE;  Surgeon: [name], MD;  Location: [facility] OR;  Service: Orthopedics;  Laterality: Left;   I & D EXTREMITY Left 10/30/2021   Procedure: LEFT HIP DEBRIDEMENT WITH KERECIS PLACEMENT;  Surgeon: [name], MD;  Location: [facility] OR;  Service: Orthopedics;  Laterality: Left;   INCISION AND DRAINAGE OF WOUND N/A 01/24/2017   Procedure: IRRIGATION AND DEBRIDEMENT WOUND- BUTTOCK ABSCESS;  Surgeon: Almond Lint, MD;  Location: [facility] ORS;  Service: General;  Laterality: N/A;   [name] WITHOUT CARDIOVERSION N/A 09/09/2021   Procedure: TRANSESOPHAGEAL ECHOCARDIOGRAM ([name]);  Surgeon: Quintella Reichert, MD;  [facility] ENDOSCOPY;  Service: Cardiovascular;  Laterality: N/A;    Social History:  reports that he has quit smoking. His smoking use included cigarettes. He has never used smokeless tobacco. He reports that he does not drink alcohol. No history on file for drug use.  Allergies:  Allergies  Allergen Reactions   Firvanq [Vancomycin] Itching    Medications Prior to Admission  Medication Sig Dispense Refill   acetaminophen (TYLENOL) 500 MG tablet Take 1,000 mg by mouth daily as needed for moderate pain, headache or fever.     flecainide (TAMBOCOR) 50 MG tablet Take 1 tablet (50 mg total) by mouth 2 (two) times daily. 180 tablet 3   ibuprofen (ADVIL) 200 MG tablet Take 200 mg by mouth every 6 (six) hours as needed for headache or moderate pain.     lisinopril-hydrochlorothiazide (ZESTORETIC) 20-25 MG tablet Take 1 tablet by mouth daily. 90 tablet 3   metoprolol succinate (TOPROL-XL) 50 MG 24 hr tablet Take 1 tablet (50 mg total) by mouth 2 (two) times daily. Take with or immediately following a meal. 180 tablet 3   Multiple Vitamin (MULTIVITAMIN WITH MINERALS) TABS tablet Take 1 tablet by mouth daily.     nitroGLYCERIN (NITROSTAT) 0.4  MG SL tablet Place 1 tablet (0.4 mg total) under the tongue every 5 (five) minutes as needed for chest pain. 25 tablet 5   Omega-3 Fatty Acids (FISH OIL PO) Take 1 capsule by mouth daily.     POTASSIUM CHLORIDE PO Take 1 tablet by mouth daily. OTC potassium supplement     rosuvastatin (CRESTOR) 40 MG tablet Take 1 tablet by mouth once daily (Patient taking differently: Take 40 mg by mouth every evening.) 90 tablet 3   Catheters (FOLEY CATHETER 2-WAY) MISC Use as directed by urologist. 5 each 1    Blood pressure 118/64, pulse 99, temperature 98.5 F (36.9 C), temperature source Oral, resp. rate 10, height 5\' 8"  (1.727 m), weight 79.4 kg, SpO2 94%. Physical Exam:  General: pleasant, WD,  male who is  laying in bed in NAD HEENT: head is normocephalic, atraumatic.   Heart: Tachycardic with regular rhythm.   Lungs: CRespiratory effort nonlabored Abd: Soft, NT, ND, no masses, hernias, or organomegaly Wounds: Seen with RN, Isla Pence.   R sided wound (pictures #1-3) as noted below. There is healthy granulation tissue superficially. Tracks ~5cm anteriorly. There was purulent drainage noted on dressing removed but no obvious drainage during exam. R hip with some erythema as noted in picture 3        Perineal wound open as noted below. No active drainage on exam but again with some purulent drainage on dressing. The wound tracks at least 14cm superiorly and left. There is a smaller opening L superior that is contiguous with the wound below.     L hip wound with small opening. No overlying skin changes. No active drainage but purulent drainage noted on packing that was removed. Wound tracks 4-5 cm anteriorly. Also tracks 4-5cm inferiorly towards opening on L buttock/perineum but not obviously connected (see last picture)     Results for orders placed or performed during the hospital encounter of 07/25/22 (from the past 48 hour(s))  Culture, blood (Routine X 2) w Reflex to ID Panel      Status: None (Preliminary result)   Collection Time: 08/02/22  5:38 PM   Specimen: BLOOD  Result Value Ref Range   Specimen Description BLOOD PICC LINE    Special Requests      BOTTLES DRAWN AEROBIC AND ANAEROBIC Blood Culture adequate volume   Culture      NO GROWTH 2 DAYS Performed at [facility], [address], [place], [place] [address]    Report Status PENDING   Culture, blood (Routine X 2) w Reflex to ID Panel     Status: None (Preliminary result)   Collection Time: 08/02/22  5:53 PM   Specimen: BLOOD  Result Value Ref Range   Specimen Description BLOOD PICC LINE    Special Requests      BOTTLES DRAWN AEROBIC AND ANAEROBIC Blood Culture results may not be optimal due to an excessive volume of blood received in culture bottles   Culture      NO GROWTH 2 DAYS Performed at [facility], [address], [place], [place] [address]    Report Status PENDING   CBC     Status: Abnormal   Collection Time: 08/03/22  3:10 AM  Result Value Ref Range   WBC 12.6 (H) 4.0 - 10.5 K/uL   RBC 3.49 (L) 4.22 - 5.81 MIL/uL   Hemoglobin 7.7 (L) 13.0 - 17.0 g/dL    Comment: Reticulocyte Hemoglobin testing may be clinically indicated, consider ordering this additional test [id number]    HCT 26.5 (L) 39.0 - 52.0 %   MCV 75.9 (L) 80.0 - 100.0 fL   MCH 22.1 (L) 26.0 - 34.0 pg   MCHC 29.1 (L) 30.0 - 36.0 g/dL   RDW 78.2 (H) 95.6 - 21.3 %   Platelets 396 150 - 400 K/uL   nRBC 0.0 0.0 - 0.2 %    Comment: Performed at [facility], [address] [place], [place] [address]  Basic metabolic panel     Status: Abnormal   Collection Time: 08/03/22  3:10 AM  Result Value Ref Range   Sodium 135 135 - 145 mmol/L   Potassium 3.7 3.5 - 5.1 mmol/L   Chloride 105 98 - 111 mmol/L   CO2 26 22 - 32 mmol/L  Glucose, Bld 104 (H) 70 - 99 mg/dL    Comment: Glucose reference range applies only to samples taken after fasting for at least 8 hours.   BUN 8 6 - 20 mg/dL   Creatinine, Ser 6.96  (L) 0.61 - 1.24 mg/dL   Calcium 7.8 (L) 8.9 - 10.3 mg/dL   GFR, Estimated >29 >52 mL/min    Comment: (NOTE) Calculated using the CKD-EPI Creatinine Equation (2021)    Anion gap 4 (L) 5 - 15    Comment: Performed at [facility], [address] [place], [place] [address]  CBC     Status: Abnormal   Collection Time: 08/04/22  5:30 AM  Result Value Ref Range   WBC 9.9 4.0 - 10.5 K/uL   RBC 3.35 (L) 4.22 - 5.81 MIL/uL   Hemoglobin 7.4 (L) 13.0 - 17.0 g/dL    Comment: Reticulocyte Hemoglobin testing may be clinically indicated, consider ordering this additional test [id number]    HCT 25.5 (L) 39.0 - 52.0 %   MCV 76.1 (L) 80.0 - 100.0 fL   MCH 22.1 (L) 26.0 - 34.0 pg   MCHC 29.0 (L) 30.0 - 36.0 g/dL   RDW 53.6 (H) 64.4 - 03.4 %   Platelets 418 (H) 150 - 400 K/uL   nRBC 0.0 0.0 - 0.2 %    Comment: Performed at [facility], [address] [place], [place] [address]  Basic metabolic panel     Status: Abnormal   Collection Time: 08/04/22  5:30 AM  Result Value Ref Range   Sodium 137 135 - 145 mmol/L   Potassium 3.7 3.5 - 5.1 mmol/L   Chloride 106 98 - 111 mmol/L   CO2 25 22 - 32 mmol/L   Glucose, Bld 104 (H) 70 - 99 mg/dL    Comment: Glucose reference range applies only to samples taken after fasting for at least 8 hours.   BUN 11 6 - 20 mg/dL   Creatinine, Ser 5.63 (L) 0.61 - 1.24 mg/dL   Calcium 7.8 (L) 8.9 - 10.3 mg/dL   GFR, Estimated >87 >56 mL/min    Comment: (NOTE) Calculated using the CKD-EPI Creatinine Equation (2021)    Anion gap 6 5 - 15    Comment: Performed at [facility], [address], [place], [place] [address]   DG CHEST PORT 1 VIEW  Result Date: 08/02/2022 CLINICAL DATA:  Fever and tachypnea EXAM: PORTABLE CHEST 1 VIEW COMPARISON:  07/30/2022 FINDINGS: Cardiac shadow is stable. The lungs are well aerated bilaterally. No focal infiltrate is seen. No bony abnormality is noted. IMPRESSION: No acute abnormality seen. Electronically Signed   By:  Alcide Clever M.D.   On: 08/02/2022 21:03   MR PELVIS W WO CONTRAST  Result Date: 08/02/2022 CLINICAL DATA:  Soft tissue infection, possible osteomyelitis. EXAM: MRI PELVIS WITHOUT AND WITH CONTRAST TECHNIQUE: Multiplanar multisequence MR imaging of the pelvis was performed both before and after administration of intravenous contrast. CONTRAST:  8mL GADAVIST GADOBUTROL 1 MMOL/ML IV SOLN COMPARISON:  CT pelvis 07/25/2022 FINDINGS: Osseous structures and joints: Absence of posterior sacrum possibly from bony destruction or spina bifida. Posterior to the sacrum a 6.7 by 2.9 by 4.6 cm complex masslike appearance is again observed. Chronic osteomyelitis with bony destruction of both proximal femurs, the acetabula, and inferior pubic rami noted with abnormal osseous edema and enhancement within the bony structures especially the iliac bones and ischium. Large decubitus ulcers extend into the hip joint regions bilaterally.  Currently there is pseudoarticulation of bony structures of the proximal femur with irregular bony structures of the right iliac bone, and separation of the left femur from the left iliac bone, with gas tracking into the expected joint region from the deep decubitus ulcers. Musculotendinous: The deep decubitus ulcers extend through the gluteal musculature to the cutaneous surface is posterior to the hip joints. There is tracking gas and fluid within the resulting cavities. Otherwise severe regional atrophy noted. Severe fatty atrophy of the upper leg musculature compatible with paralysis. Soft tissues: Substantial edema and enhancement in the subcutaneous tissues lateral to both hips, right greater than left, compatible cellulitis. Cellulitis tracks down in both upper extremities especially anteromedially. There is edema and swelling of the scrotum and scrotal cellulitis is not excluded. A suprapubic catheter is in place in the urinary bladder. No dilated bowel in the pelvis. IMPRESSION: 1. Chronic  osteomyelitis with bony destruction of both proximal femurs, the acetabula, and inferior pubic rami. 2. Large decubitus ulcers extend through the gluteal musculature to the cutaneous surface is posterior to the hip joints. There is tracking gas and fluid within the resulting cavities. 3. Absence of posterior sacrum possibly from bony destruction or spina bifida. Posterior to the sacrum a 6.7 by 2.9 by 4.6 cm complex masslike appearance is again observed. 4. Substantial edema and enhancement in the subcutaneous tissues lateral to both hips, right greater than left, compatible with cellulitis. Cellulitis tracks down in both upper extremities especially anteromedially. 5. There is edema and swelling of the scrotum and scrotal cellulitis is not excluded. 6. Severe fatty atrophy of the upper leg musculature compatible with paralysis. Electronically Signed   By: Gaylyn Rong M.D.   On: 08/02/2022 20:07      Assessment/Plan Spina bfida with paraplegia and chronic sacral ulcers and osteomyelitis  Consult for fecal diversion  - Defer tx of Osteo + bacteremia to ID and possible septic joints to Ortho. He has been seen by Urology for perineal wound. Agree that this does not look like it needs urgent/emergent surgery/debridement at this time. They are planning follow up with Urology/plastic in the future. Wound care as outlined by WOCN.  - Agree with plan for fecal diversion. Discussed plan for diverting colostomy with the patient. The planned procedure and material risks were discussed with the patient. Risks include but are not limited to anesthesia (MI, CVA, prolonged intubation, aspiration, death), pain, bleeding, infection, scarring, hernia, damage to surrounding structures, ostomy issues, DVT/PE  were discussed with the patient. The patient's questions were answered to their satisfaction, they voiced understanding and elected to proceed with surgery. Will review case with MD and discuss timing of this.  Anticipate first availability would be 7/19. He will need Lovenox held at midnight and will need to be NPO at midnight the night before surgery. WOCN consult for marking.   I reviewed hospitalist notes, last 24 h vitals and pain scores, last 48 h intake and output, last 24 h labs and trends, and last 24 h imaging results.   Leary Roca, [facility] 08/06/2022, 9:37 AM Please see Amion for pager number during day hours 7:00am-4:30pm

## 2022-08-04 NOTE — Progress Notes (Signed)
[facility]    Date of Admission:  07/25/2022   Total days of antibiotics 10          ID: Anthony [name] is a 52 y.o. male with spina bifida,admitted 07/25/22 with septic shock and bacteremia. Source felt to most likely be chronic sacral decubitus ulcers. PMH significant for spina bifida and paraplegia and neurogenic bladder. Chronic SPT in place that is changed monthly. Patient has had previous [place] amputation of his left hip. Patient currently does his own wound care.  Principal Problem:   Polymicrobial bacterial infection Active Problems:   Acute Right shoulder pain   Decubitus ulcer due to spina bifida (HCC)   Paraplegia (HCC)   Anemia of infection and chronic disease   Chronic osteomyelitis of pelvic region (HCC)   Cough   Acute deep vein thrombosis (DVT) of axillary vein of right upper extremity (HCC)   Pressure injury of contiguous region involving left buttock and hip, stage 4 (HCC)    Subjective: Underwent [name] that ruled out endocarditis. Still having once a day fever x 2 past days up to 103F.  Leukocytosis improved  Medications:   sodium chloride   Intravenous Once   acetaminophen  1,000 mg Oral Q6H   acetaminophen  500 mg Oral Once   Chlorhexidine Gluconate Cloth  6 each Topical Daily   enoxaparin (LOVENOX) injection  1 mg/kg Subcutaneous Q12H   feeding supplement  237 mL Oral TID BM   flecainide  50 mg Oral BID   lidocaine  1-2 patch Transdermal Q24H   loratadine  10 mg Oral Daily   methocarbamol  1,000 mg Oral Q8H   metroNIDAZOLE  500 mg Oral Q12H   multivitamin with minerals  1 tablet Oral Daily   sodium chloride flush  10-40 mL Intracatheter Q12H   zinc sulfate  220 mg Oral Daily    Objective: Vital signs in last 24 hours: Temp:  [98.1 F (36.7 C)-103.1 F (39.5 C)] 98.7 F (37.1 C) (07/15 1419) Pulse Rate:  [78-138] 103 (07/15 1419) Resp:  [10-21] 18 (07/15 1419) BP: (95-134)/(50-75) 118/66 (07/15 1453) SpO2:  [91  %-100 %] 96 % (07/15 1419)  Physical Exam  Constitutional: He is oriented to person, place, and time. He appears well-developed and well-nourished. No distress.  HENT:  Mouth/Throat: Oropharynx is clear and moist. No oropharyngeal exudate.  Cardiovascular: Normal rate, regular rhythm and normal heart sounds. Exam reveals no gallop and no friction rub.  No murmur heard.  Pulmonary/Chest: Effort normal and breath sounds normal. No respiratory distress. He has no wheezes.  Abdominal: Soft. Bowel sounds are normal. He exhibits no distension. There is no tenderness.  Ext: wasted lower extremities Skin: Skin is warm and dry. No rash noted. No erythema.  Psychiatric: He has a normal mood and affect. His behavior is normal.    Lab Results Recent Labs    08/03/22 0310 08/04/22 0530  WBC 12.6* 9.9  HGB 7.7* 7.4*  HCT 26.5* 25.5*  NA 135 137  K 3.7 3.7  CL 105 106  CO2 26 25  BUN 8 11  CREATININE 0.42* 0.46*    Microbiology: 7/6 blood cx ngtd 7/5 blood cx strep and proteus bacteremia Studies/Results: CT Angio Chest Pulmonary Embolism (PE) W or WO Contrast  Result Date: 08/04/2022 CLINICAL DATA:  Pulmonary embolus suspected with high probability. EXAM: CT ANGIOGRAPHY CHEST WITH CONTRAST TECHNIQUE: Multidetector CT imaging of the chest was performed using the standard protocol during bolus administration  of intravenous contrast. Multiplanar CT image reconstructions and MIPs were obtained to evaluate the vascular anatomy. RADIATION DOSE REDUCTION: This exam was performed according to the departmental dose-optimization program which includes automated exposure control, adjustment of the mA and/or kV according to patient size and/or use of iterative reconstruction technique. CONTRAST:  75mL OMNIPAQUE IOHEXOL 350 MG/ML SOLN COMPARISON:  11/22/2019 FINDINGS: Cardiovascular: The contrast bolus is somewhat limited but there is moderately good opacification of the central and proximal segmental  pulmonary arteries. Motion artifact also limits the examination. As visualized, there is no evidence of significant pulmonary embolus at least down to the mid segmental level. Normal heart size. No pericardial effusions. Normal caliber thoracic aorta. Mediastinum/Nodes: Esophagus is decompressed. No significant lymphadenopathy. Thyroid gland is unremarkable. Lungs/Pleura: Small bilateral pleural effusions with basilar infiltrates. Changes may be due to compressive atelectasis or pneumonia. Upper Abdomen: No acute abnormality. Musculoskeletal: Degenerative changes in the spine. No destructive bone lesions. Review of the MIP images confirms the above findings. IMPRESSION: 1. No evidence of significant pulmonary embolus. 2. Small bilateral pleural effusions with basilar atelectasis or consolidation. Electronically Signed   By: Burman Nieves M.D.   On: 08/04/2022 16:15   ECHO [name]  Result Date: 08/04/2022    TRANSESOPHOGEAL ECHO REPORT   Patient Name:   Anthony [name] Date of Exam: 08/04/2022 Medical Rec #:  [id number]      Height:       68.0 in Accession #:    [id number]     Weight:       175.0 lb Date of Birth:  Sep 21, 1970     BSA:          1.931 m Patient Age:    51 years       BP:           127/71 mmHg Patient Gender: M              HR:           98 bpm. Exam Location:  Inpatient Procedure: Transesophageal Echo, Color Doppler and Cardiac Doppler Indications:     Bacteremia  History:         Patient has prior history of Echocardiogram examinations, most                  recent 07/26/2022. Risk Factors:Dyslipidemia.  Sonographer:     Vergie Living Senior RDCS Referring Phys:  [id number] Jonita Albee Diagnosing Phys: Zoila Shutter MD PROCEDURE: After discussion of the risks and benefits of a [name], an informed consent was obtained from the patient. The transesophogeal probe was passed without difficulty through the esophogus of the patient. Sedation performed by different physician. The patient was monitored while under  deep sedation. Anesthestetic sedation was provided intravenously by Anesthesiology: 154mg  of Propofol, 100mg  of Lidocaine. The patient developed no complications during the procedure.  IMPRESSIONS  1. Left ventricular ejection fraction, by estimation, is 55 to 60%. The left ventricle has normal function. There is mild left ventricular hypertrophy.  2. Right ventricular systolic function is normal. The right ventricular size is normal.  3. No left atrial/left atrial appendage thrombus was detected.  4. The mitral valve is grossly normal. Mild mitral valve regurgitation.  5. The aortic valve is tricuspid. Aortic valve regurgitation is not visualized. No aortic stenosis is present. Conclusion(s)/Recommendation(s): No evidence of vegetation/infective endocarditis on this transesophageael echocardiogram. FINDINGS  Left Ventricle: Left ventricular ejection fraction, by estimation, is 55 to 60%. The left ventricle has normal function. The left  ventricular internal cavity size was normal in size. There is mild left ventricular hypertrophy. Right Ventricle: The right ventricular size is normal. No increase in right ventricular wall thickness. Right ventricular systolic function is normal. Left Atrium: Left atrial size was normal in size. No left atrial/left atrial appendage thrombus was detected. Right Atrium: Right atrial size was normal in size. Pericardium: There is no evidence of pericardial effusion. Mitral Valve: The mitral valve is grossly normal. Mild mitral valve regurgitation. Tricuspid Valve: The tricuspid valve is grossly normal. Tricuspid valve regurgitation is mild. Aortic Valve: The aortic valve is tricuspid. Aortic valve regurgitation is not visualized. No aortic stenosis is present. Pulmonic Valve: The pulmonic valve was normal in structure. Pulmonic valve regurgitation is not visualized. Aorta: The aortic root and ascending aorta are structurally normal, with no evidence of dilitation. IAS/Shunts: No atrial  level shunt detected by color flow Doppler. Additional Comments: Spectral Doppler performed. Zoila Shutter MD Electronically signed by Zoila Shutter MD Signature Date/Time: 08/04/2022/4:07:55 PM    Final    EP STUDY  Result Date: 08/04/2022 See surgical note for result.  DG CHEST PORT 1 VIEW  Result Date: 08/02/2022 CLINICAL DATA:  Fever and tachypnea EXAM: PORTABLE CHEST 1 VIEW COMPARISON:  07/30/2022 FINDINGS: Cardiac shadow is stable. The lungs are well aerated bilaterally. No focal infiltrate is seen. No bony abnormality is noted. IMPRESSION: No acute abnormality seen. Electronically Signed   By: Alcide Clever M.D.   On: 08/02/2022 21:03   MR PELVIS W WO CONTRAST  Result Date: 08/02/2022 CLINICAL DATA:  Soft tissue infection, possible osteomyelitis. EXAM: MRI PELVIS WITHOUT AND WITH CONTRAST TECHNIQUE: Multiplanar multisequence MR imaging of the pelvis was performed both before and after administration of intravenous contrast. CONTRAST:  8mL GADAVIST GADOBUTROL 1 MMOL/ML IV SOLN COMPARISON:  CT pelvis 07/25/2022 FINDINGS: Osseous structures and joints: Absence of posterior sacrum possibly from bony destruction or spina bifida. Posterior to the sacrum a 6.7 by 2.9 by 4.6 cm complex masslike appearance is again observed. Chronic osteomyelitis with bony destruction of both proximal femurs, the acetabula, and inferior pubic rami noted with abnormal osseous edema and enhancement within the bony structures especially the iliac bones and ischium. Large decubitus ulcers extend into the hip joint regions bilaterally. Currently there is pseudoarticulation of bony structures of the proximal femur with irregular bony structures of the right iliac bone, and separation of the left femur from the left iliac bone, with gas tracking into the expected joint region from the deep decubitus ulcers. Musculotendinous: The deep decubitus ulcers extend through the gluteal musculature to the cutaneous surface is posterior to  the hip joints. There is tracking gas and fluid within the resulting cavities. Otherwise severe regional atrophy noted. Severe fatty atrophy of the upper leg musculature compatible with paralysis. Soft tissues: Substantial edema and enhancement in the subcutaneous tissues lateral to both hips, right greater than left, compatible cellulitis. Cellulitis tracks down in both upper extremities especially anteromedially. There is edema and swelling of the scrotum and scrotal cellulitis is not excluded. A suprapubic catheter is in place in the urinary bladder. No dilated bowel in the pelvis. IMPRESSION: 1. Chronic osteomyelitis with bony destruction of both proximal femurs, the acetabula, and inferior pubic rami. 2. Large decubitus ulcers extend through the gluteal musculature to the cutaneous surface is posterior to the hip joints. There is tracking gas and fluid within the resulting cavities. 3. Absence of posterior sacrum possibly from bony destruction or spina bifida. Posterior to the sacrum a 6.7  by 2.9 by 4.6 cm complex masslike appearance is again observed. 4. Substantial edema and enhancement in the subcutaneous tissues lateral to both hips, right greater than left, compatible with cellulitis. Cellulitis tracks down in both upper extremities especially anteromedially. 5. There is edema and swelling of the scrotum and scrotal cellulitis is not excluded. 6. Severe fatty atrophy of the upper leg musculature compatible with paralysis. Electronically Signed   By: Gaylyn Rong M.D.   On: 08/02/2022 20:07     Assessment/Plan: Polymicrobial bacteremia thought to be due to chronic sacral wound/osteomyelitis = continue on dapto/cefepime and metronidazole. Concern that the still has daily fever- also receiving high doses of tylenol and ibuprofen.  Recommend to stop antipyretics to see his true fever curve.  Discuss with ortho what MRI pelvis findings are amenable to I x D.  [facility] Pager: [phone]  08/04/2022, 5:28 PM

## 2022-08-04 NOTE — Interval H&P Note (Signed)
History and Physical Interval Note:  08/04/2022 1:55 PM  Anthony [name]  has presented today for surgery, with the diagnosis of Bacteremia.  The various methods of treatment have been discussed with the patient and family. After consideration of risks, benefits and other options for treatment, the patient has consented to  Procedure(s): TRANSESOPHAGEAL ECHOCARDIOGRAM (N/A) as a surgical intervention.  The patient's history has been reviewed, patient examined, no change in status, stable for surgery.  I have reviewed the patient's chart and labs.  Questions were answered to the patient's satisfaction.     Anthony [name]

## 2022-08-04 NOTE — Assessment & Plan Note (Addendum)
Patient cleans and packs his own wounds regularly. Ortho recommended more frequent packing dressing changes of left hip wound with follow-up o/p. Will continue with aggressive wound care and IV antibiotics  - Wound care on board - ID on board  - consulted GS regarding possible diverting fistula

## 2022-08-04 NOTE — Progress Notes (Signed)
On-going Red MEWS for this patient with known infection. No evidence of hemodynamic compromise. MD and Charge RN notified.    08/04/22 2310  Assess: MEWS Score  Temp (!) 102 F (38.9 C)  BP 123/65  MAP (mmHg) 83  Pulse Rate (!) 112  ECG Heart Rate (!) 112  Resp (!) 33  Level of Consciousness Alert  SpO2 95 %  O2 Device Room Air  Patient Activity (if Appropriate) In bed  Assess: MEWS Score  MEWS Temp 2  MEWS Systolic 0  MEWS Pulse 2  MEWS RR 2  MEWS LOC 0  MEWS Score 6  MEWS Score Color Red  Assess: if the MEWS score is Yellow or Red  Were vital signs taken at a resting state? Yes  Focused Assessment No change from prior assessment  Does the patient meet 2 or more of the SIRS criteria? Yes  Does the patient have a confirmed or suspected source of infection? Yes  MEWS guidelines implemented  Yes, red  Treat  MEWS Interventions Considered administering scheduled or prn medications/treatments as ordered  Take Vital Signs  Increase Vital Sign Frequency  Red: Q1hr x2, continue Q4hrs until patient remains green for 12hrs  Escalate  MEWS: Escalate Red: Discuss with charge nurse and notify provider. Consider notifying RRT. If remains red for 2 hours consider need for higher level of care  Notify: Charge Nurse/RN  Name of Charge Nurse/RN Notified Jasmine December, RN  Provider Notification  Provider Name/Title Dr. [name]  Date Provider Notified 08/04/22  Time Provider Notified 2319  Method of Notification Page (secure chat)  Notification Reason Other (Comment) (MEWS)  Provider response No new orders  Date of Provider Response 08/04/22  Time of Provider Response 2319  Assess: SIRS CRITERIA  SIRS Temperature  1  SIRS Pulse 1  SIRS Respirations  1  SIRS WBC 0  SIRS Score Sum  3

## 2022-08-04 NOTE — Progress Notes (Signed)
Pt returned from CT by way of cath lab. Pt left for [name] and then to CT for CT angio. PICC became an issue in CT and PIV was in left [facility] when pt returned to room. IV team consult placed as pt had PICC and PIV in same arm. Removed PIV and IV team to assess site. Flushing slow and no blood return. PM RN aware Pt resting with call bell within reach.  Will continue to monitor.

## 2022-08-04 NOTE — Progress Notes (Addendum)
FMTS Interim Progress Note  S: Patient was seen at bedside with Dr. Melissa Noon. Nurse reports that his PICC line was clotted for about 4 hours so he is behind on his antibiotics.  Patient is doing well, laying in bed comfortably.  Reports having some chills on and off.  O: BP 118/66   Pulse (!) 103   Temp 98.7 F (37.1 C) (Temporal)   Resp 18   Ht 5\' 8"  (1.727 m)   Wt 79.4 kg   SpO2 96%   BMI 26.61 kg/m    General: NAD, awake and alert, having chills on and off Respiratory: normal WOB on RA  A/P: Polymicrobial bacterial infection (possibly due to septic arthritis of both hips, chronic osteomyelitis)  Stable. S/p [name] today with no evidence of endocarditis, mild MR and TR and mild LAE, LVEF 55-60%. Palliative and General surgery were consulted today to discuss potential surgical options. - No PRN Tylenol or Ibuprofen this evening to truly assess his fever curve - Continue Cefepime/Daptomycin/Flagyl  - Oxy 2.5 mg q3h PRN for pain control  [name], DO 08/04/2022, 7:44 PM PGY-1, [facility] Family Medicine Service pager [phone]

## 2022-08-04 NOTE — CV Procedure (Signed)
TRANSESOPHAGEAL ECHOCARDIOGRAM ([name]) NOTE  INDICATIONS: infective endocarditis  PROCEDURE:   Informed consent was obtained prior to the procedure. The risks, benefits and alternatives for the procedure were discussed and the patient comprehended these risks.  Risks include, but are not limited to, cough, sore throat, vomiting, nausea, somnolence, esophageal and stomach trauma or perforation, bleeding, low blood pressure, aspiration, pneumonia, infection, trauma to the teeth and death.    After a procedural time-out, the patient was given propofol for sedation by anesthesia. See their separate report.  The patient's heart rate, blood pressure, and oxygen saturation are monitored continuously during the procedure.The oropharynx was anesthetized with topical cetacaine.  The transesophageal probe was inserted in the esophagus and stomach without difficulty and multiple views were obtained.  The patient was kept under observation until the patient left the procedure room.  I was present face-to-face 100% of this time. The patient left the procedure room in stable condition.   Agitated microbubble saline contrast was not administered.  COMPLICATIONS:    There were no immediate complications.  Findings:  LEFT VENTRICLE: The left ventricular wall thickness is normal.  The left ventricular cavity is normal in size. Wall motion is normal.  LVEF is 55-60%.  RIGHT VENTRICLE:  The right ventricle is normal in structure and function without any thrombus or masses.    LEFT ATRIUM:  The left atrium is mildly dilated in size without any thrombus or masses.  There is not spontaneous echo contrast ("smoke") in the left atrium consistent with a low flow state.  LEFT ATRIAL APPENDAGE:  The small left atrial appendage is free of any thrombus or masses. The appendage has single lobes. Pulse doppler indicates moderate flow in the appendage.  ATRIAL SEPTUM:  The atrial septum appears intact and is free of  thrombus and/or masses.  There is no evidence for interatrial shunting by color doppler.  RIGHT ATRIUM:  The right atrium is normal in size and function without any thrombus or masses.  MITRAL VALVE:  The mitral valve leaflets appear normal with  trivial to mild  regurgitation.  There were no vegetations or stenosis.  AORTIC VALVE:  The aortic valve is trileaflet, normal in structure and function with  no  regurgitation.  There were no vegetations or stenosis  TRICUSPID VALVE:  The tricuspid valve is normal in structure and function with Mild regurgitation.  There were no vegetations or stenosis   PULMONIC VALVE:  The pulmonic valve is normal in structure and function with  no  regurgitation.  There were no vegetations or stenosis.   AORTIC ARCH, ASCENDING AND DESCENDING AORTA:  There was no Myrtis Ser et. Al, 1992) atherosclerosis of the ascending aorta, aortic arch, or proximal descending aorta.  12. PULMONARY VEINS: Anomalous pulmonary venous return was not noted.  13. PERICARDIUM: The pericardium appeared normal and non-thickened.  There is no pericardial effusion.  IMPRESSION:   No evidence of endocarditis Negative for PFO by color doppler No LAA thrombus Mild MR, mild TR Mild LAE LVEF 55-60%, normal wall motion  RECOMMENDATIONS:     Treatment per ID recs for bacteremia without endocarditis.  Time Spent Directly with the Patient:  30 minutes   Chrystie Nose, MD, [facility], FACP  [place]  [facility]  Medical Director of the [facility]  [facility] Diplomate of the American Board of Clinical Lipidology Attending Cardiologist  Direct Dial: [phone]  Fax: [phone]  Website:  [email or web address]  Chrystie Nose 08/04/2022,  2:13 PM

## 2022-08-04 NOTE — Consult Note (Signed)
Consultation Note Date: 08/04/2022   Patient Name: Anthony [name]  DOB: 06/30/1970  MRN: [id number]  Age / Sex: 52 y.o., male  PCP: Erick Alley, DO Referring Physician: McDiarmid, Leighton Roach, MD  Reason for Consultation: Establishing goals of care  HPI/Patient Profile: 52 y.o. male  with past medical history of spina bifida with secondary paraplegia, osteomyelitis, endocarditis, hypertension, chronic foley d/t neurogenic bladder, wheelchair bound admitted on 07/25/2022 with sepsis with chronic decubitus ulcers. MRI pelvis 7/13 with evidence of gas and fluid tracking within cavity of hip joints, edema in R > L hip consistent with cellulitis, concern for soft tissue involvement tracking to scrotum, chronic osteomyelitis and bony destruction of pubic rami. Plans for [name]. ID and ortho following for recommendations.   Clinical Assessment and Goals of Care: Consult received and extensive chart review completed. I came to bedside but RN reports that Coreyon has just fallen asleep after receiving compazine and morning of vomiting. No family at bedside.   Update: I returned to bedside and mother, Hilda Lias, is present. Zethan is awake and without distress. I introduced myself from palliative care and discussed with them that I can assist with further conversation and assistance to discuss path forward. They have Advance Directive but have not filled this out and I report that I can assist them talking them through this document as part of our goals of care conversation. They both agree this would be helpful to discuss. He has had a difficult time over the past ~1 year with declining health. Hilda Lias is tearful at bedside and tells me that she just wants him well. Dakota's father and sister are both deceased so it is really just he and his mother. Raif is on his way to CT scan after getting cleaned up and then plans for [name] this afternoon.  We agree to meet tomorrow ~1-2pm for goals of care discussion.   All questions/concerns addressed. Emotional support provided.   Primary Decision Maker PATIENT    SUMMARY OF RECOMMENDATIONS   - Family meeting tomorrow 7/16 1/2pm  Code Status/Advance Care Planning: Full code - will discuss more tomorrow   Symptom Management:  Per attending, ID, ortho  Prognosis:  Overall prognosis poor with chronic wounds and high infection burden.   Discharge Planning: To Be Determined      Primary Diagnoses: Present on Admission:  Acute Right shoulder pain  Anemia of infection and chronic disease  (Resolved) Polymicrobial bacteremia with sepsis:  Chronic osteomyelitis of pelvic region (HCC)  Paraplegia (HCC)  Polymicrobial bacterial infection  (Resolved) Pressure ulcer of contiguous region involving buttock and hip, stage 4 (HCC)   I have reviewed the medical record, interviewed the patient and family, and examined the patient. The following aspects are pertinent.  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/24/2017 01/24/2017   Osteomyelitis (HCC)    left hip   Paraplegia (HCC)    secondary to Spina Bifida   Polymicrobial bacteremia with sepsis:    Pressure injury of skin of left buttock 05/13/2021   PVCs (premature ventricular contractions)    S/P debridement 10/30/2021   Septic shock (HCC)    due to osteomyelitis   Slow transit constipation 03/06/2017   Spina bifida    Status post debridement 01/24/2017   Wheelchair bound    Social History   Socioeconomic History   Marital status: Single    Spouse name: Not on file   Number of children: Not on file   Years of  education: Not on file   Highest education level: Not on file  Occupational History   Not on file  Tobacco Use   Smoking status: Former    Types: Cigarettes   Smokeless tobacco: Never  Vaping Use   Vaping status: Never Used  Substance and Sexual Activity   Alcohol use: No   Drug use: Not on file   Sexual activity: Not on file  Other Topics Concern   Not on file  Social History Narrative   Not on file   Social Determinants of Health   Financial Resource Strain: Medium Risk (01/23/2022)   Overall Financial Resource Strain (CARDIA)    Difficulty of Paying Living Expenses: Somewhat hard  Food Insecurity: No Food Insecurity (07/26/2022)   Hunger Vital Sign    Worried About Running Out of Food in the Last Year: Never true    Ran Out of Food in the Last Year: Never true  Transportation Needs: No Transportation Needs (07/26/2022)   PRAPARE - Administrator, Civil Service (Medical): No    Lack of Transportation (Non-Medical): No  Physical Activity: Inactive (01/23/2022)   Exercise Vital Sign    Days of Exercise per Week: 0 days    Minutes of Exercise per Session: 0 min  Stress: Stress Concern Present (01/23/2022)   [employer] of Occupational Health - Occupational Stress Questionnaire    Feeling of Stress : Rather much  Social Connections: Socially Isolated (01/23/2022)   Social Connection and Isolation Panel [NHANES]    Frequency of Communication with Friends and Family: Three times a week    Frequency of Social Gatherings with Friends and Family: Once a week    Attends Religious Services: Never    Database administrator or Organizations: No    Attends Engineer, structural: Never    Marital Status: Never married   Family History  Problem Relation Age of Onset   CAD Father        died age 80 from MI   Sudden Cardiac Death Sister        age 95   CAD Maternal Grandfather        died age 9 MI   Scheduled Meds:  sodium chloride   Intravenous Once    acetaminophen  1,000 mg Oral Q6H   acetaminophen  500 mg Oral Once   Chlorhexidine Gluconate Cloth  6 each Topical Daily   enoxaparin (LOVENOX) injection  1 mg/kg Subcutaneous Q12H   feeding supplement  237 mL Oral TID BM   flecainide  50 mg Oral BID   lidocaine  1-2 patch Transdermal Q24H   loratadine  10 mg Oral Daily  methocarbamol  1,000 mg Oral Q8H   metroNIDAZOLE  500 mg Oral Q12H   multivitamin with minerals  1 tablet Oral Daily   sodium chloride flush  10-40 mL Intracatheter Q12H   zinc sulfate  220 mg Oral Daily   Continuous Infusions:  sodium chloride Stopped (08/01/22 1441)   ceFEPime (MAXIPIME) IV 2 g (08/04/22 0848)   DAPTOmycin (CUBICIN) 650 mg in sodium chloride 0.9 % IVPB 650 mg (08/03/22 1410)   PRN Meds:.diclofenac Sodium, ibuprofen, LORazepam **AND** [COMPLETED] LORazepam, oxyCODONE **OR** [DISCONTINUED] oxyCODONE, polyethylene glycol, prochlorperazine, sodium chloride flush Allergies  Allergen Reactions   Firvanq [Vancomycin] Itching   Review of Systems  Constitutional:  Positive for activity change and fatigue.  Gastrointestinal:  Positive for nausea.  Skin:  Positive for wound.  Neurological:  Positive for weakness.    Physical Exam Vitals and nursing note reviewed.  Constitutional:      General: He is not in acute distress.    Appearance: He is ill-appearing.  Cardiovascular:     Rate and Rhythm: Tachycardia present.  Pulmonary:     Effort: No tachypnea, accessory muscle usage or respiratory distress.  Abdominal:     Palpations: Abdomen is soft.  Neurological:     Mental Status: He is alert and oriented to person, place, and time.     Vital Signs: BP 134/75 (BP Location: Right Arm)   Pulse (!) 108   Temp 98.3 F (36.8 C)   Resp (!) 21   Ht 5\' 8"  (1.727 m)   Wt 79.4 kg   SpO2 100%   BMI 26.61 kg/m  Pain Scale: 0-10 POSS *See Group Information*: S-Acceptable,Sleep, easy to arouse Pain Score: 0-No pain   SpO2: SpO2: 100 % O2  Device:SpO2: 100 % O2 Flow Rate: .O2 Flow Rate (L/min): 2 L/min  IO: Intake/output summary:  Intake/Output Summary (Last 24 hours) at 08/04/2022 0943 Last data filed at 08/04/2022 0538 Gross per 24 hour  Intake --  Output 2975 ml  Net -2975 ml    LBM: Last BM Date : 08/03/22 Baseline Weight: Weight: 79.4 kg Most recent weight: Weight: 79.4 kg     Palliative Assessment/Data:     Time Total: 60 min  Greater than 50%  of this time was spent counseling and coordinating care related to the above assessment and plan.  Signed by: Yong Channel, NP Palliative Medicine Team Pager # [phone] (M-F 8a-5p) Team Phone # [phone] (Nights/Weekends)

## 2022-08-04 NOTE — Assessment & Plan Note (Signed)
Polymicrobial bacterial infection of both gram-positive and negative bacteria resulting from chronic decubitus ulcers due to paraplegia from lumbar spina bifida. Patient has maintained blood pressures. Patient continues to fever periodically and complaints of chills and generalized weakness. Echo 7/6 without evidence of endocarditis. ID on board, who recommended a repeat [name] for re-evaluation of endocarditis. Ortho and urology recommend aggressive wound care but no surgical intervention at this time. IR consulted re possible drainage- nothing to drain according to them. Exchanged suprapubic catheter 7/10. Fevered yesterday to 103, prn ibuprofen. Reached out to [place] 7/15 who reports he will not do anythign until GS sees for fistual. GS consulted.  - [name] scheduled for 7/15 (today)  - general surgery consulted, appreciate recs  - palliative consult  - Daptomycin (7/5 - ) - IV Cefepime (7/5 - ) - Flagyl (7/5 - )   - BCID: strep spp + proteus, no grown >2 days  - PRN Ibuprofen for fever

## 2022-08-04 NOTE — Progress Notes (Signed)
Spoke with nurse regarding POC and PICC use. Tomasita Morrow, RN VAST

## 2022-08-04 NOTE — Transfer of Care (Signed)
Immediate Anesthesia Transfer of Care Note  Patient: Anthony [name]  Procedure(s) Performed: TRANSESOPHAGEAL ECHOCARDIOGRAM  Patient Location:  CPU  Anesthesia Type:MAC  Level of Consciousness: awake and alert   Airway & Oxygen Therapy: Patient Spontanous Breathing and Patient connected to nasal cannula oxygen  Post-op Assessment: Report given to RN and Post -op Vital signs reviewed and stable  Post vital signs: Reviewed and stable  Last Vitals:  Vitals Value Taken Time  BP    Temp    Pulse    Resp    SpO2      Last Pain:  Vitals:   08/04/22 1340  TempSrc: Temporal  PainSc: 0-No pain      Patients Stated Pain Goal: 0 (08/04/22 0154)  Complications: No notable events documented.

## 2022-08-04 NOTE — Progress Notes (Signed)
PT Cancellation Note  Patient Details Name: Anthony [name] MRN: [id number] DOB: 04-04-1970   Cancelled Treatment:    Reason Eval/Treat Not Completed: (P) Patient at procedure or test/unavailable (Pt off floor for procedure, will follow  up as able.)   [name] 08/04/2022, 2:46 PM

## 2022-08-05 ENCOUNTER — Encounter (HOSPITAL_COMMUNITY): Payer: Self-pay | Admitting: Internal Medicine

## 2022-08-05 DIAGNOSIS — Z515 Encounter for palliative care: Secondary | ICD-10-CM | POA: Diagnosis not present

## 2022-08-05 DIAGNOSIS — L8994 Pressure ulcer of unspecified site, stage 4: Secondary | ICD-10-CM

## 2022-08-05 DIAGNOSIS — R7881 Bacteremia: Secondary | ICD-10-CM | POA: Diagnosis not present

## 2022-08-05 DIAGNOSIS — M86659 Other chronic osteomyelitis, unspecified thigh: Secondary | ICD-10-CM | POA: Diagnosis not present

## 2022-08-05 DIAGNOSIS — Z7189 Other specified counseling: Secondary | ICD-10-CM | POA: Diagnosis not present

## 2022-08-05 DIAGNOSIS — L089 Local infection of the skin and subcutaneous tissue, unspecified: Secondary | ICD-10-CM | POA: Diagnosis not present

## 2022-08-05 DIAGNOSIS — M8618 Other acute osteomyelitis, other site: Secondary | ICD-10-CM | POA: Diagnosis not present

## 2022-08-05 LAB — BASIC METABOLIC PANEL
Anion gap: 10 (ref 5–15)
BUN: 9 mg/dL (ref 6–20)
CO2: 23 mmol/L (ref 22–32)
Calcium: 7.9 mg/dL — ABNORMAL LOW (ref 8.9–10.3)
Chloride: 106 mmol/L (ref 98–111)
Creatinine, Ser: 0.42 mg/dL — ABNORMAL LOW (ref 0.61–1.24)
GFR, Estimated: >60 mL/min (ref >60)
Glucose, Bld: 109 mg/dL — ABNORMAL HIGH (ref 70–99)
Potassium: 3.7 mmol/L (ref 3.5–5.1)
Sodium: 139 mmol/L (ref 135–145)

## 2022-08-05 LAB — CBC
HCT: 25.9 % — ABNORMAL LOW (ref 39.0–52.0)
Hemoglobin: 7.6 g/dL — ABNORMAL LOW (ref 13.0–17.0)
MCH: 22.6 pg — ABNORMAL LOW (ref 26.0–34.0)
MCHC: 29.3 g/dL — ABNORMAL LOW (ref 30.0–36.0)
MCV: 77.1 fL — ABNORMAL LOW (ref 80.0–100.0)
Platelets: 536 10*3/uL — ABNORMAL HIGH (ref 150–400)
RBC: 3.36 MIL/uL — ABNORMAL LOW (ref 4.22–5.81)
RDW: 25.3 % — ABNORMAL HIGH (ref 11.5–15.5)
WBC: 11.9 10*3/uL — ABNORMAL HIGH (ref 4.0–10.5)
nRBC: 0 % (ref 0.0–0.2)

## 2022-08-05 MED ORDER — VITAMIN C 500 MG PO TABS
500.0000 mg | ORAL_TABLET | Freq: Two times a day (BID) | ORAL | Status: DC
  Administered 2022-08-06 – 2022-09-01 (×52): 500 mg via ORAL
  Filled 2022-08-05 (×52): qty 1

## 2022-08-05 MED ORDER — ZINC SULFATE 220 (50 ZN) MG PO CAPS
220.0000 mg | ORAL_CAPSULE | Freq: Every day | ORAL | Status: DC
  Administered 2022-08-06 – 2022-09-01 (×26): 220 mg via ORAL
  Filled 2022-08-05 (×26): qty 1

## 2022-08-05 MED ORDER — PIPERACILLIN-TAZOBACTAM 3.375 G IVPB
3.3750 g | Freq: Three times a day (TID) | INTRAVENOUS | Status: DC
  Administered 2022-08-05 – 2022-08-14 (×27): 3.375 g via INTRAVENOUS
  Filled 2022-08-05 (×25): qty 50

## 2022-08-05 MED ORDER — IBUPROFEN 400 MG PO TABS
600.0000 mg | ORAL_TABLET | ORAL | Status: DC | PRN
  Administered 2022-08-05 – 2022-08-13 (×21): 600 mg via ORAL
  Filled 2022-08-05 (×21): qty 2

## 2022-08-05 MED ORDER — LACTATED RINGERS IV BOLUS
500.0000 mL | Freq: Once | INTRAVENOUS | Status: AC
  Administered 2022-08-05: 500 mL via INTRAVENOUS

## 2022-08-05 MED ORDER — PIPERACILLIN-TAZOBACTAM 3.375 G IVPB 30 MIN: 3.3750 g | Freq: Three times a day (TID) | INTRAVENOUS | Status: DC

## 2022-08-05 NOTE — Progress Notes (Signed)
Nutrition Follow-up  DOCUMENTATION CODES:   Not applicable  INTERVENTION:   Ensure Enlive po TID, each supplement provides 350 kcal and 20 grams of protein.  Magic cup TID with meals, each supplement provides 290 kcal and 9 grams of protein  MVI po daily   Vitamin C 500mg  po BID  Zinc 220mg  po daily x 30 days  Check vitamins A, D, E, C, zinc, copper, iron, TIBC, ferritin, folate and B12.   NUTRITION DIAGNOSIS:   Increased nutrient needs related to wound healing as evidenced by estimated needs. -ongoing   GOAL:   Patient will meet greater than or equal to 90% of their needs -progressing   MONITOR:   PO intake, Supplement acceptance, Labs, Weight trends, Skin, I & O's  ASSESSMENT:   52 y.o. male with past medical history of spina bifida with secondary paraplegia, chronic decubitis uclers with osteomyelitis, chronic foley d/t neurogenic bladder, wheelchair bound, endocarditis and hypertension who is admitted with sepsis, R > L hip cellulitis, osteomyelitis with bony destruction of pubic rami and RUE DVT.  Pt s/p [name] 7/15; no vegetations noted   Pt continues to have fairly good appetite and oral intake. Per RN, pt did not eat breakfast this morning but ate over 75% of his lunch. RN reports that patient is drinking 100% of his Ensure supplements. RD will add Magic Cups to meal trays. RD will check vitamin labs important for wound healing to r/o deficiency. Pt also noted to have microcytic anemia; will check iron/anemia labs. No new weight since admission; will request serial weights if patient's bed is able to take weights. General surgery evaluating patient for possible diverting ostomy. Palliative care is also following.   Medications reviewed and include: lovenox, MVI, zinc, daptomycin, zosyn  Labs reviewed: K 3.7 wnl, creat 0.42(L) Wbc- 11.9(H), Hgb 7.6(L), Hct 25.9(L), MCV 77.1(L), MCH 22.6(L), MCHC 29.3(L)  Diet Order:   Diet Order             Diet regular Room  service appropriate? Yes; Fluid consistency: Thin  Diet effective now                  EDUCATION NEEDS:   Education needs have been addressed  Skin:  Skin Assessment: Reviewed RN Assessment (Stage IV decubitus ulcers R & L sacrum, R hip ulcer)  Last BM:  7/16  Height:   Ht Readings from Last 1 Encounters:  07/25/22 5\' 8"  (1.727 m)    Weight:   Wt Readings from Last 1 Encounters:  07/25/22 79.4 kg    Ideal Body Weight:  70 kg  BMI:  Body mass index is 26.61 kg/m.  Estimated Nutritional Needs:   Kcal:  2100-2400kcal/day  Protein:  105-120g/day  Fluid:  2.1-2.4L/day  Betsey Holiday MS, RD, LDN Please refer to [facility] for RD and/or RD on-call/weekend/after hours pager

## 2022-08-05 NOTE — Assessment & Plan Note (Signed)
Patient has been complaining of some increased shortness of breath and cough.  He has also been tachycardic to the 130s. Concern for PE, CT angiogram was negative. Chest xray negative. Reports no cough today.  - incentive spirometry - continue to monitor

## 2022-08-05 NOTE — Assessment & Plan Note (Addendum)
Patient cleans and packs his own wounds regularly. Ortho recommended more frequent packing dressing changes of left hip wound with follow-up o/p. Will continue with aggressive wound care and IV antibiotics  - Wound care on board - ID on board  - consulted GS regarding possible diverting fistula, awaiting recs

## 2022-08-05 NOTE — Progress Notes (Signed)
[facility]    Date of Admission:  07/25/2022   Total days of antibiotics 11          ID: GUAGE EFFERSON is a 52 y.o. male with pelvic osteomyelitis/significant sacral wound  Principal Problem:   Pressure injury, stage 4, with infection (HCC) Active Problems:   Acute Right shoulder pain   Decubitus ulcer due to spina bifida (HCC)   Paraplegia (HCC)   Anemia of infection and chronic disease   Polymicrobial bacterial infection   Chronic osteomyelitis of pelvic region (HCC)   Cough   Acute deep vein thrombosis (DVT) of axillary vein of right upper extremity (HCC)   Pressure injury of contiguous region involving left buttock and hip, stage 4 (HCC)    Subjective: Still having fevers up to 102F in evenings, treated with ibuprofen and acetaminophen. He feels that ibuprofen works best.  Medications:   sodium chloride   Intravenous Once   Chlorhexidine Gluconate Cloth  6 each Topical Daily   enoxaparin (LOVENOX) injection  1 mg/kg Subcutaneous Q12H   feeding supplement  237 mL Oral TID BM   flecainide  50 mg Oral BID   lidocaine  1-2 patch Transdermal Q24H   loratadine  10 mg Oral Daily   methocarbamol  1,000 mg Oral Q8H   multivitamin with minerals  1 tablet Oral Daily   sodium chloride flush  10-40 mL Intracatheter Q12H   zinc sulfate  220 mg Oral Daily    Objective: Vital signs in last 24 hours: Temp:  [98.1 F (36.7 C)-102.9 F (39.4 C)] 101.7 F (38.7 C) (07/16 1100) Pulse Rate:  [103-118] 104 (07/16 1100) Resp:  [18-37] 32 (07/16 1100) BP: (96-130)/(55-76) 117/56 (07/16 1100) SpO2:  [91 %-98 %] 95 % (07/16 1100)  Physical Exam  Constitutional: He is oriented to person, place, and time. He appears well-developed and well-nourished. No distress.  HENT:  Mouth/Throat: Oropharynx is clear and moist. No oropharyngeal exudate.  Cardiovascular: Normal rate, regular rhythm and normal heart sounds. Exam reveals no gallop and no friction rub.  No  murmur heard.  Pulmonary/Chest: Effort normal and breath sounds normal. No respiratory distress. He has no wheezes.  Abdominal: Soft. Bowel sounds are normal. He exhibits no distension. There is no tenderness.  QMV:HQIONG lower extremities from spina bifida Neurological: He is alert and oriented to person, place, and time.  Skin: Skin is warm and dry. No rash noted. No erythema.  Psychiatric: He has a normal mood and affect. His behavior is normal.    Lab Results Recent Labs    08/04/22 0530 08/05/22 0405  WBC 9.9 11.9*  HGB 7.4* 7.6*  HCT 25.5* 25.9*  NA 137 139  K 3.7 3.7  CL 106 106  CO2 25 23  BUN 11 9  CREATININE 0.46* 0.42*    Microbiology: Blood cx = Proteus  and streptococcus Studies/Results: CT Angio Chest Pulmonary Embolism (PE) W or WO Contrast  Result Date: 08/04/2022 CLINICAL DATA:  Pulmonary embolus suspected with high probability. EXAM: CT ANGIOGRAPHY CHEST WITH CONTRAST TECHNIQUE: Multidetector CT imaging of the chest was performed using the standard protocol during bolus administration of intravenous contrast. Multiplanar CT image reconstructions and MIPs were obtained to evaluate the vascular anatomy. RADIATION DOSE REDUCTION: This exam was performed according to the departmental dose-optimization program which includes automated exposure control, adjustment of the mA and/or kV according to patient size and/or use of iterative reconstruction technique. CONTRAST:  75mL OMNIPAQUE IOHEXOL 350 MG/ML  SOLN COMPARISON:  11/22/2019 FINDINGS: Cardiovascular: The contrast bolus is somewhat limited but there is moderately good opacification of the central and proximal segmental pulmonary arteries. Motion artifact also limits the examination. As visualized, there is no evidence of significant pulmonary embolus at least down to the mid segmental level. Normal heart size. No pericardial effusions. Normal caliber thoracic aorta. Mediastinum/Nodes: Esophagus is decompressed. No  significant lymphadenopathy. Thyroid gland is unremarkable. Lungs/Pleura: Small bilateral pleural effusions with basilar infiltrates. Changes may be due to compressive atelectasis or pneumonia. Upper Abdomen: No acute abnormality. Musculoskeletal: Degenerative changes in the spine. No destructive bone lesions. Review of the MIP images confirms the above findings. IMPRESSION: 1. No evidence of significant pulmonary embolus. 2. Small bilateral pleural effusions with basilar atelectasis or consolidation. Electronically Signed   By: Burman Nieves M.D.   On: 08/04/2022 16:15   ECHO [name]  Result Date: 08/04/2022    TRANSESOPHOGEAL ECHO REPORT   Patient Name:   MANLEY FASON Date of Exam: 08/04/2022 Medical Rec #:  [id number]      Height:       68.0 in Accession #:    [id number]     Weight:       175.0 lb Date of Birth:  14-Mar-1970     BSA:          1.931 m Patient Age:    51 years       BP:           127/71 mmHg Patient Gender: M              HR:           98 bpm. Exam Location:  Inpatient Procedure: Transesophageal Echo, Color Doppler and Cardiac Doppler Indications:     Bacteremia  History:         Patient has prior history of Echocardiogram examinations, most                  recent 07/26/2022. Risk Factors:Dyslipidemia.  Sonographer:     Vergie Living Senior RDCS Referring Phys:  [id number] Jonita Albee Diagnosing Phys: Zoila Shutter MD PROCEDURE: After discussion of the risks and benefits of a [name], an informed consent was obtained from the patient. The transesophogeal probe was passed without difficulty through the esophogus of the patient. Sedation performed by different physician. The patient was monitored while under deep sedation. Anesthestetic sedation was provided intravenously by Anesthesiology: 154mg  of Propofol, 100mg  of Lidocaine. The patient developed no complications during the procedure.  IMPRESSIONS  1. Left ventricular ejection fraction, by estimation, is 55 to 60%. The left ventricle has normal  function. There is mild left ventricular hypertrophy.  2. Right ventricular systolic function is normal. The right ventricular size is normal.  3. No left atrial/left atrial appendage thrombus was detected.  4. The mitral valve is grossly normal. Mild mitral valve regurgitation.  5. The aortic valve is tricuspid. Aortic valve regurgitation is not visualized. No aortic stenosis is present. Conclusion(s)/Recommendation(s): No evidence of vegetation/infective endocarditis on this transesophageael echocardiogram. FINDINGS  Left Ventricle: Left ventricular ejection fraction, by estimation, is 55 to 60%. The left ventricle has normal function. The left ventricular internal cavity size was normal in size. There is mild left ventricular hypertrophy. Right Ventricle: The right ventricular size is normal. No increase in right ventricular wall thickness. Right ventricular systolic function is normal. Left Atrium: Left atrial size was normal in size. No left atrial/left atrial appendage thrombus was detected. Right Atrium: Right atrial size  was normal in size. Pericardium: There is no evidence of pericardial effusion. Mitral Valve: The mitral valve is grossly normal. Mild mitral valve regurgitation. Tricuspid Valve: The tricuspid valve is grossly normal. Tricuspid valve regurgitation is mild. Aortic Valve: The aortic valve is tricuspid. Aortic valve regurgitation is not visualized. No aortic stenosis is present. Pulmonic Valve: The pulmonic valve was normal in structure. Pulmonic valve regurgitation is not visualized. Aorta: The aortic root and ascending aorta are structurally normal, with no evidence of dilitation. IAS/Shunts: No atrial level shunt detected by color flow Doppler. Additional Comments: Spectral Doppler performed. Zoila Shutter MD Electronically signed by Zoila Shutter MD Signature Date/Time: 08/04/2022/4:07:55 PM    Final    EP STUDY  Result Date: 08/04/2022 See surgical note for result.     Assessment/Plan: Ongoing fevers - concern that we don't have source control. Would see if ortho can do debridement in setting of patient also getting diverting colostomy  Pelvic osteo = continue with daptomycin and stop cefepime/metronidazole - will change to piptazo to see if could be related to drug fever -need to check sed rate and crp   [facility] Pager: [phone]  08/05/2022, 12:48 PM

## 2022-08-05 NOTE — Progress Notes (Signed)
Went to bedside to check on patient for fever 102.66F, increasing HR and tachypnea in 20s-30s. Remains at his baseline mentation/clinical exam.  Ordered 500 cc bolus.  Will continue to monitor vitals closely.  Darral Dash, DO

## 2022-08-05 NOTE — Plan of Care (Signed)

## 2022-08-05 NOTE — Assessment & Plan Note (Addendum)
Has received 3 units pRBCS during this admission. There is a questions whether the decrease in Hgb is d/t dilution vs anemia 2/2 to sepsis. Stable today  - Based on his CBC, if Hgb < 7.5, will order more blood from [facility] to administer prior to [name] - Daily CBC

## 2022-08-05 NOTE — Progress Notes (Signed)
Palliative:  HPI: 52 y.o. male  with past medical history of spina bifida with secondary paraplegia, osteomyelitis, endocarditis, hypertension, chronic foley d/t neurogenic bladder, wheelchair bound admitted on 07/25/2022 with sepsis with chronic decubitus ulcers. MRI pelvis 7/13 with evidence of gas and fluid tracking within cavity of hip joints, edema in R > L hip consistent with cellulitis, concern for soft tissue involvement tracking to scrotum, chronic osteomyelitis and bony destruction of pubic rami. Plans for [name]. ID and ortho following for recommendations.    I met today with Anthony [name] and his mother, Anthony [name]. We reviewed test results from yesterday. He is feeling a little better today - no nausea. We discussed diverting colostomy and Anthony [name] and Anthony [name] are supportive of pursuing colostomy. We discussed colostomy in more detail and although he does not want this he understands that the benefits to his health outweigh the risks and burden of having colostomy.   We spent much time discussing goals of care as we reviewed Advance Directive. He elects his mother, Anthony [name], as primary HCPOA and his uncle, Anthony [name], as secondary. He would not want his life prolonged in the 3 scenarios of terminal illness, comatose state, or advanced dementia. He also would not want to be prolonged in these scenarios with artificial nutrition or hydration. He would desire temporary feeding tube (such as Cortrak) but would never desire long term feeding tube (such as PEG) - this was noted in Special Instructions section of directive. He would desire organ donation. He desires full code at this time and is hopeful for improvement. His ultimate goal is to stabilize his infection, wounds, and health so he can return home. He reports that he would not want to live long term in nursing facility.   All questions/concerns addressed. Emotional support provided.   Exam: Alert, oriented. No distress. HR RRR. Breathing regular, unlabored. Abd soft.  Generalized weakness and fatigue.   Plan: - Full code, full scope.  - Advance Directive completed - awaiting witness/notary.  - Would not desire life to be prolonged if terminally ill, comatose, or advanced dementia.   65 min  Yong Channel, NP Palliative Medicine Team Pager [phone] (Please see [email or web address] for schedule) Team Phone [phone]    Greater than 50%  of this time was spent counseling and coordinating care related to the above assessment and plan

## 2022-08-05 NOTE — Assessment & Plan Note (Signed)
Polymicrobial bacterial infection of both gram-positive and negative bacteria resulting from chronic decubitus ulcers due to paraplegia from lumbar spina bifida. Patient has maintained blood pressures. Patient continues to fever periodically and complaints of chills and generalized weakness. Echo 7/6 without evidence of endocarditis. ID on board, who recommended a repeat [name] for re-evaluation of endocarditis. Ortho and urology recommend aggressive wound care but no surgical intervention at this time. IR consulted re possible drainage- nothing to drain according to them. Exchanged suprapubic catheter 7/10. Fevered yesterday to 103, prn ibuprofen. Reached out to Dr. [name] 7/15 who reports he thinks there is stool in his wounds which is contributing to this continued infection and recommended general surgery evaluate patient for diverting ostomy. GS consulted. [name] did no show vegetations.  - general surgery consulted, appreciate recs  - family meeting with palliative today  - Daptomycin (7/5 - ) - Zosyn started today per ID recs  - IV Cefepime (7/5 - 7/16) - Flagyl (7/5 -7/16 )   - BCID: strep spp + proteus, no grown >2 days  - ibuprofen restarted today prn for fevers

## 2022-08-05 NOTE — Anesthesia Postprocedure Evaluation (Signed)
Anesthesia Post Note  Patient: Anthony [name]  Procedure(s) Performed: TRANSESOPHAGEAL ECHOCARDIOGRAM     Patient location during evaluation: PACU Anesthesia Type: MAC Level of consciousness: awake and alert Pain management: pain level controlled Vital Signs Assessment: post-procedure vital signs reviewed and stable Respiratory status: spontaneous breathing Cardiovascular status: stable Anesthetic complications: no   No notable events documented.  Last Vitals:  Vitals:   08/05/22 0700 08/05/22 0800  BP: (!) 106/55   Pulse: (!) 106   Resp: (!) 31   Temp:  (!) 39.3 C  SpO2: 91% 92%    Last Pain:  Vitals:   08/05/22 0800  TempSrc: Oral  PainSc: 0-No pain                 Lewie Loron

## 2022-08-05 NOTE — Progress Notes (Signed)
Daily Progress Note Intern Pager: [phone]  Patient name: Anthony [name] Medical record number: [id number] Date of birth: 1970-08-08 Age: 52 y.o. Gender: male  Primary Care Provider: Erick Alley, DO Consultants: ID, Cardiology, Dr. [name] (Orthopaedics), and General Surgery.  Code Status: Full  Pt Overview and Major Events to Date:  7/5: Admitted 7/6: Ortho consulted, stated no acute intervention indicated. ID consulted, recommended BSA bx 7/7: Hg dropped and patient received 1x pRBCs  7/8: Hg dropped again, received another unit of pRBC, [name] ordered per ID recs  7/9: replaced catheter 7/12: received 1x pRBC 7/15: [name]  Assessment and Plan: Anthony [name] is a 52 yo M with PMHx of spina bfida with paraplegia and chronic sacral ulcers admitted for septic shock 2/2 to infected sacral ulcers and osteomyelitis as supported by CT and MRI. He is on day 12 of broad spectrum antibiotics.   [facility]     * (Principal) Polymicrobial bacterial infection     Polymicrobial bacterial infection of both gram-positive and negative  bacteria resulting from chronic decubitus ulcers due to paraplegia from  lumbar spina bifida. Patient has maintained blood pressures. Patient  continues to fever periodically and complaints of chills and generalized  weakness. Echo 7/6 without evidence of endocarditis. ID on board, who  recommended a repeat [name] for re-evaluation of endocarditis. Ortho and  urology recommend aggressive wound care but no surgical intervention at  this time. IR consulted re possible drainage- nothing to drain according  to them. Exchanged suprapubic catheter 7/10. Fevered yesterday to 103, prn  ibuprofen. Reached out to Dr. [name] 7/15 who reports he thinks there is  stool in his wounds which is contributing to this continued infection and  recommended general surgery evaluate patient for diverting ostomy. GS  consulted. [name] did no show vegetations.  - general surgery consulted,  appreciate recs  - family meeting with palliative today  - Daptomycin (7/5 - ) - Zosyn started today per ID recs  - IV Cefepime (7/5 - 7/16) - Flagyl (7/5 -7/16 )   - BCID: strep spp + proteus, no grown >2 days  - ibuprofen restarted today prn for fevers        Acute Right shoulder pain     XR negative for fracture. Patient now also has a DVT in her RUE which  he has a difficult time raising up of the bed this AM. Increased his  oxycodone from q6h to q3h. Does not complain of pain today.  - D/c oxycodone as patient is no longer requiring  - multimodal pain management with roxabin, volataren, topical lidocaine  and prn tylenol - reached back out to PT/OT as patient is motivated to work with them         Decubitus ulcer due to spina bifida [facility])     Patient cleans and packs his own wounds regularly. Ortho recommended  more frequent packing dressing changes of left hip wound with follow-up  o/p. Will continue with aggressive wound care and IV antibiotics  - Wound care on board - ID on board  - consulted GS regarding possible diverting fistula, awaiting recs        Paraplegia (HCC)     Anemia of infection and chronic disease     Has received 3 units pRBCS during this admission. There is a questions  whether the decrease in Hgb is d/t dilution vs anemia 2/2 to sepsis.  Stable today  - Based on his  CBC, if Hgb < 7.5, will order more blood from [facility] to  administer prior to [name] - Daily CBC        Chronic osteomyelitis of pelvic region [facility])     Cough     Patient has been complaining of some increased shortness of breath and  cough.  He has also been tachycardic to the 130s. Concern for PE, CT  angiogram was negative. Chest xray negative. Reports no cough today.  - incentive spirometry - continue to monitor        Acute deep vein thrombosis (DVT) of axillary vein of right upper  extremity (HCC)     DVT noted on R upper extremity. Patient R arm is not swollen and   nontender.  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 He states he fevered last night but did not have chills. He feels like he might start to get chills again today. He reports his R shoulder pain feels better today and he would like to start PT/OT again. He also reports his cough is doing okay. No other complaints.   Objective: Temp:  [98.1 F (36.7 C)-102.9 F (39.4 C)] 101.7 F (38.7 C) (07/16 1100) Pulse Rate:  [99-118] 104 (07/16 1100) Resp:  [10-37] 32 (07/16 1100) BP: (96-130)/(55-76) 117/56 (07/16 1100) SpO2:  [91 %-98 %] 95 % (07/16 1100) Physical Exam: General: in bed, NAD Cardiovascular: RRR, no M/R/G Respiratory: NWOB on RA Abdomen: Slightly distended, soft Extremities: parapalegia, RUE without swelling or tenderness, limited ROM on R shoulder   Laboratory: Most recent CBC Lab Results  Component Value Date   WBC 11.9 (H) 08/05/2022   HGB 7.6 (L) 08/05/2022   HCT 25.9 (L) 08/05/2022   MCV 77.1 (L) 08/05/2022   PLT 536 (H) 08/05/2022   Most recent BMP    Latest Ref Rng & Units 08/05/2022    4:05 AM  BMP  Glucose 70 - 99 mg/dL 956   BUN 6 - 20 mg/dL 9   Creatinine 2.13 - 0.86 mg/dL 5.78   Sodium 469 - 629 mmol/L 139   Potassium 3.5 - 5.1 mmol/L 3.7   Chloride 98 - 111 mmol/L 106   CO2 22 - 32 mmol/L 23   Calcium 8.9 - 10.3 mg/dL 7.9     Other pertinent labs    Imaging/Diagnostic Tests: [name] negative for vegetations or infective endocarditis.  Georg Ruddle, Ashantia Amaral Georg Ruddle, MD 08/05/2022, 12:06 PM  PGY-1, [facility] FPTS Intern pager: [phone], text pages welcome Secure chat group [facility] Teaching Service

## 2022-08-05 NOTE — Assessment & Plan Note (Signed)
 DVT noted on R upper extremity. Patient R arm is not swollen and nontender.  - lovenox 1mg /kg q12h

## 2022-08-05 NOTE — Assessment & Plan Note (Signed)
XR negative for fracture. Patient now also has a DVT in her RUE which he has a difficult time raising up of the bed this AM. Increased his oxycodone from q6h to q3h. Does not complain of pain today.  - D/c oxycodone as patient is no longer requiring  - multimodal pain management with roxabin, volataren, topical lidocaine and prn tylenol - reached back out to PT/OT as patient is motivated to work with them

## 2022-08-05 NOTE — Progress Notes (Signed)
PT Cancellation Note  Patient Details Name: TEMITAYO COVALT MRN: [id number] DOB: February 07, 1970   Cancelled Treatment:    Reason Eval/Treat Not Completed: (P) Patient declined, no reason specified (Pt reports "there's a lot going on right now" and politely asks therapy to return tomorrow.)   [name] 08/05/2022, 2:16 PM

## 2022-08-06 DIAGNOSIS — L089 Local infection of the skin and subcutaneous tissue, unspecified: Secondary | ICD-10-CM

## 2022-08-06 DIAGNOSIS — R7881 Bacteremia: Secondary | ICD-10-CM | POA: Diagnosis not present

## 2022-08-06 DIAGNOSIS — R5081 Fever presenting with conditions classified elsewhere: Secondary | ICD-10-CM | POA: Diagnosis not present

## 2022-08-06 DIAGNOSIS — M86659 Other chronic osteomyelitis, unspecified thigh: Secondary | ICD-10-CM | POA: Diagnosis not present

## 2022-08-06 DIAGNOSIS — L8994 Pressure ulcer of unspecified site, stage 4: Secondary | ICD-10-CM | POA: Diagnosis not present

## 2022-08-06 DIAGNOSIS — A499 Bacterial infection, unspecified: Secondary | ICD-10-CM | POA: Diagnosis not present

## 2022-08-06 DIAGNOSIS — R509 Fever, unspecified: Secondary | ICD-10-CM | POA: Diagnosis not present

## 2022-08-06 LAB — C-REACTIVE PROTEIN: CRP: 16.4 mg/dL — ABNORMAL HIGH (ref <1.0)

## 2022-08-06 LAB — VITAMIN B12: Vitamin B-12: 667 pg/mL (ref 180–914)

## 2022-08-06 LAB — CBC
HCT: 25.4 % — ABNORMAL LOW (ref 39.0–52.0)
Hemoglobin: 7.3 g/dL — ABNORMAL LOW (ref 13.0–17.0)
MCH: 22.2 pg — ABNORMAL LOW (ref 26.0–34.0)
MCHC: 28.7 g/dL — ABNORMAL LOW (ref 30.0–36.0)
MCV: 77.2 fL — ABNORMAL LOW (ref 80.0–100.0)
Platelets: 502 10*3/uL — ABNORMAL HIGH (ref 150–400)
RBC: 3.29 MIL/uL — ABNORMAL LOW (ref 4.22–5.81)
RDW: 24.8 % — ABNORMAL HIGH (ref 11.5–15.5)
WBC: 10.8 10*3/uL — ABNORMAL HIGH (ref 4.0–10.5)
nRBC: 0 % (ref 0.0–0.2)

## 2022-08-06 LAB — VITAMIN D 25 HYDROXY (VIT D DEFICIENCY, FRACTURES): Vit D, 25-Hydroxy: 42.36 ng/mL (ref 30–100)

## 2022-08-06 LAB — IRON AND TIBC
Iron: 7 ug/dL — ABNORMAL LOW (ref 45–182)
Saturation Ratios: 5 % — ABNORMAL LOW (ref 17.9–39.5)
TIBC: 147 ug/dL — ABNORMAL LOW (ref 250–450)
UIBC: 140 ug/dL

## 2022-08-06 LAB — FOLATE: Folate: 10.7 ng/mL (ref >5.9)

## 2022-08-06 LAB — SEDIMENTATION RATE: Sed Rate: 140 mm/hr — ABNORMAL HIGH (ref 0–16)

## 2022-08-06 LAB — FERRITIN: Ferritin: 189 ng/mL (ref 24–336)

## 2022-08-06 MED ORDER — ACETAMINOPHEN 500 MG PO TABS
1000.0000 mg | ORAL_TABLET | Freq: Four times a day (QID) | ORAL | Status: DC | PRN
  Administered 2022-08-07 – 2022-08-19 (×18): 1000 mg via ORAL
  Filled 2022-08-06 (×19): qty 2

## 2022-08-06 MED ORDER — DAKINS (1/4 STRENGTH) 0.125 % EX SOLN
CUTANEOUS | Status: AC | PRN
  Filled 2022-08-06: qty 473

## 2022-08-06 NOTE — Consult Note (Signed)
WOC Nurse requested for preoperative stoma site marking  Discussed surgical procedure and stoma creation with patient and family.  Explained role of the WOC nurse team.  Provided the patient with educational booklet and provided samples of pouching options.  Answered patient and family questions.   Examined patient lying, sitting, and standing in order to place the marking in the patient's visual field, away from any creases or abdominal contour issues and within the rectus muscle.  Attempted to mark below the patient's belt line.   Marked for colostomy in the LLQ  __6__ cm to the left of the umbilicus and __1__cm above  the umbilicus.  Marked for ileostomy in the RLQ  _4___cm to the right of the umbilicus and  _1.5___ cm above the umbilicus. Patient very engaged to learn about ostomy and care. He will care for this alone at hom  Patient's abdomen cleansed with CHG wipes at site markings, allowed to air dry prior to marking.Covered mark with thin film transparent dressing to preserve mark until date of surgery.   WOC Nurse team will follow up with patient after surgery for continue ostomy care and teaching.  [name] MSN, RN,CWOCN, CNS, [employer] [phone]

## 2022-08-06 NOTE — Assessment & Plan Note (Addendum)
Has received 3 units pRBCS during this admission. There is a questions whether the decrease in Hgb is d/t dilution vs anemia 2/2 to sepsis. Stable today Hg has been stable s/p RBC transfusion. Iron studies show decreased iron and TIBC with normal ferritin, likely in the setting on anemia of chronic disease.  - Based on his CBC, if Hgb < 7.5, will order more blood from [facility] to administer prior to [name] - Daily CBC

## 2022-08-06 NOTE — Assessment & Plan Note (Signed)
Patient cleans and packs his own wounds regularly. Ortho recommended more frequent packing dressing changes of left hip wound with follow-up o/p. Will continue with aggressive wound care and IV antibiotics  - Wound care on board - ID on board  - consulted GS regarding possible diverting fistula, awaiting recs

## 2022-08-06 NOTE — Assessment & Plan Note (Signed)
Patient has been complaining of some increased shortness of breath and cough.  He has also been tachycardic to the 130s. Concern for PE, CT angiogram was negative. Chest xray negative. Reports no cough today.  - incentive spirometry - continue to monitor

## 2022-08-06 NOTE — Assessment & Plan Note (Addendum)
XR negative for fracture. Patient reports wanting PT on this arm.  - PT/OT on board  - multimodal pain management with roxabin, volataren, topical lidocaine and prn tylenol

## 2022-08-06 NOTE — Progress Notes (Addendum)
This chaplain responded to PMT NP-Alicia's consult for notarizing the Pt. Advance Directive: HCPOA and Living Will.  The chaplain appreciates a page when the Pt. and Hilda Lias are ready to notarize the Pt. AD.  The Pt. received an update from the Pt. RN-Rasheen before the visit. The Pt. is awake and willing to answer clarifying questions on the previous day's AD education. This chaplain provided education on the notary visit. The chaplain understands the Pt. mother-Marie is expected after to visit after lunch.   The chaplain began building rapport with the Pt. by asking what brings the Pt. joy. 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 The chaplain understands the Pt. hopes social media will allow him more presence in the professional world.    **[id number] This chaplain is present with the Pt., Pt. mother-Marie, notary and witnesses for the notarizing of the Pt. Advance Directive:  HCPOA and Living Will.  The Pt. named Imogene Burn as his healthcare agent. If this person is unwilling or unable to serve in this role, the Pt. second choice is Landis Gandy.  The chaplain gave the Pt. the orighinal AD along with two copies. The chaplain scanned the Pt. AD into the Pt. EMR.  This chaplain is available for F/U spiritual care as needed.  Chaplain Stephanie Acre [phone]

## 2022-08-06 NOTE — Assessment & Plan Note (Signed)
Polymicrobial bacterial infection of both gram-positive and negative bacteria resulting from chronic decubitus ulcers due to paraplegia from lumbar spina bifida. Patient has maintained blood pressures. Echo 7/6 without evidence of endocarditis. Repeat [name] 7/15 showed no vegetations . Ortho and urology recommend aggressive wound care but no surgical intervention at this time. Suprapubic catheter 7/10. Dr. [name] 7/15 thinks there is stool in his wounds and recommended general surgery evaluate patient for diverting ostomy. GS consulted.  - general surgery consulted, appreciate recs  - Daptomycin (7/5 - ) - Zosyn started today per ID recs  - IV Cefepime (7/5 - 7/16) - Flagyl (7/5 -7/16 )   - BCID: strep spp + proteus, no grown >2 days  - ibuprofen prn for fevers

## 2022-08-06 NOTE — Progress Notes (Signed)
Occupational Therapy Treatment Patient Details Name: Anthony [name] MRN: [id number] DOB: 11-13-1970 Today's Date: 08/06/2022   History of present illness Pt is a 52 y.o. male who presented 07/25/22 with weakness and a fall. Pt admitted with severe sepsis secondary to sacral decubitus ulcer infection with concern for hip septic arthritis noted on CT. Imaging of R shoulder showed moderate acromioclavicular OA & mild distal lasteral subacromial spurring. PMH: complete paraplegia, multiple toe amputations, chronic sacral wounds, neurogenic bladder with suprapubic catheter, spina bifida, HTN   OT comments  Pt refused bed mobility to sit EOB and stated that he doesn't see the importance/benefits of siting EOB and that R shoulder exercises were more important. OT educated pt on the purpose/benefits of sitting tolerance, however pt continue dot decline but agreeable to ROM exercises at bed level. AAROM FF, ABD within pain tolerance and AROM elbow to hand. Pt declined sitting EOB for ROM exercises. Reviewed self ROM exercises for L UE to assist R UE. Pt's lunch tray arrived and pt requires set up of plate. Pt had flatbread pizza and no difficulty using hands to feed himself with this finger food item. OT will continue to follow acutely to maximize level of function and safety   Recommendations for follow up therapy are one component of a multi-disciplinary discharge planning process, led by the attending physician.  Recommendations may be updated based on patient status, additional functional criteria and insurance authorization.    Assistance Recommended at Discharge Frequent or constant Supervision/Assistance  Patient can return home with the following  Two people to help with walking and/or transfers;A lot of help with bathing/dressing/bathroom;Assistance with cooking/housework;Assistance with feeding;Direct supervision/assist for medications management;Assist for transportation;Help with stairs or ramp for  entrance   Equipment Recommendations  Hospital bed    Recommendations for Other Services      Precautions / Restrictions Precautions Precautions: Fall;Other (comment) Precaution Comments: paraplegia Restrictions Weight Bearing Restrictions: Yes RLE Weight Bearing: Non weight bearing LLE Weight Bearing: Non weight bearing       Mobility Bed Mobility               General bed mobility comments: pt refused sitting EOB    Transfers                         Balance                                           ADL either performed or assessed with clinical judgement   ADL   Eating/Feeding: Set up;Sitting;Bed level Eating/Feeding Details (indicate cue type and reason): self feeds with L hand, had flatbread pizza for lunch Grooming: Wash/dry hands;Wash/dry face;Set up;Bed level                                 General ADL Comments: pt refused bed mobility and sitting EOB    Extremity/Trunk Assessment Upper Extremity Assessment Upper Extremity Assessment: RUE deficits/detail RUE Deficits / Details: AAROM FF, ABD within pain tolerance and AROM elbow to hand. Pt declined sitting EOB for ROM exercises. Reviewed self ROM exercises for L UE to assist R UE            Vision Ability to See in Adequate Light: 0 Adequate Patient Visual Report: No change from baseline  Perception     Praxis      Cognition Arousal/Alertness: Awake/alert Behavior During Therapy: WFL for tasks assessed/performed Overall Cognitive Status: Within Functional Limits for tasks assessed                                          Exercises      Shoulder Instructions       General Comments      Pertinent Vitals/ Pain       Pain Assessment Pain Assessment: 0-10 Pain Score: 5  Pain Location: R shoulder with mobility Pain Descriptors / Indicators: Grimacing, Aching, Guarding, Sore Pain Intervention(s): Monitored during  session, Limited activity within patient's tolerance, Repositioned  Home Living                                          Prior Functioning/Environment              Frequency  Min 1X/week        Progress Toward Goals  OT Goals(current goals can now be found in the care plan section)        Plan Discharge plan remains appropriate    Co-evaluation                 AM-PAC OT "6 Clicks" Daily Activity     Outcome Measure   Help from another person eating meals?: None Help from another person taking care of personal grooming?: A Little Help from another person toileting, which includes using toliet, bedpan, or urinal?: Total Help from another person bathing (including washing, rinsing, drying)?: A Lot Help from another person to put on and taking off regular upper body clothing?: A Lot Help from another person to put on and taking off regular lower body clothing?: Total 6 Click Score: 13    End of Session    OT Visit Diagnosis: Muscle weakness (generalized) (M62.81);Pain Pain - Right/Left: Right Pain - part of body: Shoulder   Activity Tolerance Patient tolerated treatment well   Patient Left in bed;with call bell/phone within reach   Nurse Communication          Time: 7829-5621 OT Time Calculation (min): 17 min  Charges: OT General Charges $OT Visit: 1 Visit OT Treatments $Therapeutic Exercise: 8-22 mins    Galen Manila 08/06/2022, 1:22 PM

## 2022-08-06 NOTE — Progress Notes (Signed)
Physical Therapy Treatment Patient Details Name: Anthony [name] MRN: [id number] DOB: 1970/12/17 Today's Date: 08/06/2022   History of Present Illness Pt is a 52 y.o. male who presented 07/25/22 with weakness and a fall. Pt admitted with severe sepsis secondary to sacral decubitus ulcer infection with concern for hip septic arthritis noted on CT. Imaging of R shoulder showed moderate acromioclavicular OA & mild distal lasteral subacromial spurring. PMH: complete paraplegia, multiple toe amputations, chronic sacral wounds, neurogenic bladder with suprapubic catheter, spina bifida, HTN    PT Comments  Pt received in supine and agreeable to session with encouragement. Pt continues to require assist with bed mobility for BLE management due to RUE pain. R shoulder ROM performed sitting EOB for decreased pain during mobility. Pt demonstrating improved lateral transfer to the w/c requiring min A, however requires increased assist transferring back to bed due to slightly higher surface. Pt able to propel w/c with RUE this session with increased pain, but pt reporting it is tolerable. Pt noted to have a BM once returned to bed and required total A for pericare. Pt able to perform a supine scoot towards HOB using BUE without assist. Pt continues to benefit from PT services to progress toward functional mobility goals.     Assistance Recommended at Discharge Intermittent Supervision/Assistance  If plan is discharge home, recommend the following:  Can travel by private vehicle    Two people to help with walking and/or transfers;A lot of help with bathing/dressing/bathroom;Assistance with cooking/housework;Assist for transportation;Help with stairs or ramp for entrance      Equipment Recommendations  Hospital bed    Recommendations for Other Services       Precautions / Restrictions Precautions Precautions: Fall;Other (comment) Precaution Comments: paraplegia Restrictions Weight Bearing Restrictions:  Yes RLE Weight Bearing: Non weight bearing LLE Weight Bearing: Non weight bearing     Mobility  Bed Mobility Overal bed mobility: Needs Assistance Bed Mobility: Rolling, Supine to Sit, Sit to Supine Rolling: Min guard   Supine to sit: Mod assist Sit to supine: Mod assist   General bed mobility comments: Assist for BLE management    Transfers Overall transfer level: Needs assistance Equipment used: None Transfers: Bed to chair/wheelchair/BSC            Lateral/Scoot Transfers: Min assist, Mod assist General transfer comment: Min A to the w/c and mod A back to bed     Corporate treasurer Wheelchair mobility: Yes Wheelchair propulsion: Both upper extremities Wheelchair parts: Independent Distance: 250 Wheelchair Assistance Details (indicate cue type and reason): Pt able to propel with BUE this session with pt reporting some increased pain in RUE, but is able to tolerate it. Assist for line management       Balance Overall balance assessment: Needs assistance Sitting-balance support: Single extremity supported, Feet unsupported Sitting balance-Leahy Scale: Fair Sitting balance - Comments: Requires UE support sitting EOB                                    Cognition Arousal/Alertness: Awake/alert Behavior During Therapy: WFL for tasks assessed/performed Overall Cognitive Status: Within Functional Limits for tasks assessed                                          Exercises General Exercises - Upper Extremity Shoulder Flexion: PROM,  AAROM, Right, 5 reps Shoulder Extension: PROM, Right, 5 reps Shoulder ABduction: PROM, 5 reps, Right Shoulder Horizontal ABduction: PROM, Right, 5 reps    General Comments        Pertinent Vitals/Pain Pain Assessment Pain Assessment: Faces Faces Pain Scale: Hurts even more Pain Location: R shoulder with mobility Pain Descriptors / Indicators: Grimacing, Aching, Guarding,  Sore Pain Intervention(s): Monitored during session, Limited activity within patient's tolerance, Repositioned     PT Goals (current goals can now be found in the care plan section) Acute Rehab PT Goals Patient Stated Goal: to improve and go home PT Goal Formulation: With patient Time For Goal Achievement: 08/09/22 Potential to Achieve Goals: Good Progress towards PT goals: Progressing toward goals    Frequency    Min 1X/week      PT Plan Current plan remains appropriate       AM-PAC PT "6 Clicks" Mobility   Outcome Measure  Help needed turning from your back to your side while in a flat bed without using bedrails?: A Little Help needed moving from lying on your back to sitting on the side of a flat bed without using bedrails?: A Lot Help needed moving to and from a bed to a chair (including a wheelchair)?: A Lot Help needed standing up from a chair using your arms (e.g., wheelchair or bedside chair)?: Total Help needed to walk in hospital room?: Total Help needed climbing 3-5 steps with a railing? : Total 6 Click Score: 10    End of Session   Activity Tolerance: Patient tolerated treatment well Patient left: in bed;with call bell/phone within reach;with family/visitor present Nurse Communication: Mobility status (dressing change due to BM) PT Visit Diagnosis: Muscle weakness (generalized) (M62.81);History of falling (Z91.81);Pain Pain - Right/Left: Right Pain - part of body: Shoulder     Time: 4098-1191 PT Time Calculation (min) (ACUTE ONLY): 50 min  Charges:    $Therapeutic Activity: 23-37 mins $Wheel Chair Management: 8-22 mins PT General Charges $$ ACUTE PT VISIT: 1 Visit                     [name], PTA Acute Rehabilitation Services Secure Chat Preferred  [phone]    [name] 08/06/2022, 4:24 PM

## 2022-08-06 NOTE — Progress Notes (Addendum)
Daily Progress Note Intern Pager: [phone]  Patient name: Anthony [name] Medical record number: [id number] Date of birth: [birth year] Age: 52 y.o. Gender: male  Primary Care Provider: Erick Alley, DO Consultants: ID, Cardiology, Dr. [name] (Orthopaedics), and General Surgery  Code Status: Full  Pt Overview and Major Events to Date:  7/5: Admitted 7/6: Ortho consulted, stated no acute intervention indicated. ID consulted, recommended BSA bx 7/7: Hg dropped and patient received 1x pRBCs  7/8: Hg dropped again, received another unit of pRBC, [name] ordered per ID recs  7/9: replaced catheter 7/12: received 1x pRBC 7/15: [name]  Assessment and Plan: anuj summons is a 52 yo M with PMHx of spina bfida with paraplegia and chronic sacral ulcers admitted for septic shock 2/2 to infected sacral ulcers and osteomyelitis as supported by CT and MRI. He is on day 13 of broad spectrum antibiotics   -      Hospital     * (Principal) Pressure injury, stage 4, with infection (HCC)     Acute Right shoulder pain     XR negative for fracture. Patient reports wanting PT on this arm.  - PT/OT on board  - multimodal pain management with roxabin, volataren, topical lidocaine  and prn tylenol         Decubitus ulcer due to spina bifida [facility])     Patient cleans and packs his own wounds regularly. Ortho recommended  more frequent packing dressing changes of left hip wound with follow-up  o/p. Will continue with aggressive wound care and IV antibiotics  - Wound care on board - ID on board  - consulted GS regarding possible diverting fistula, awaiting recs        Paraplegia (HCC)     Anemia of infection and chronic disease     Has received 3 units pRBCS during this admission. There is a questions  whether the decrease in Hgb is d/t dilution vs anemia 2/2 to sepsis.  Stable today Hg has been stable s/p RBC transfusion. Iron studies show  decreased iron and TIBC with normal ferritin, likely in the  setting on  anemia of chronic disease.  - Based on his CBC, if Hgb < 7.5, will order more blood from [place] to  administer prior to [name] - Daily CBC        Polymicrobial bacterial infection     Polymicrobial bacterial infection of both gram-positive and negative  bacteria resulting from chronic decubitus ulcers due to paraplegia from  lumbar spina bifida. Patient has maintained blood pressures. Echo 7/6  without evidence of endocarditis. Repeat [name] 7/15 showed no vegetations .  Ortho and urology recommend aggressive wound care but no surgical  intervention at this time. Suprapubic catheter 7/10. Dr. [name] 7/15 thinks  there is stool in his wounds and recommended general surgery evaluate  patient for diverting ostomy. GS consulted.  - general surgery consulted, appreciate recs  - Daptomycin (7/5 - ) - Zosyn started today per ID recs  - IV Cefepime (7/5 - 7/16) - Flagyl (7/5 -7/16 )   - BCID: strep spp + proteus, no grown >2 days  - ibuprofen prn for fevers        Chronic osteomyelitis of pelvic region (HCC)     Cough     Patient has been complaining of some increased shortness of breath and  cough.  He has also been tachycardic to the 130s. Concern for PE, CT  angiogram was negative. Chest xray negative. Reports no  cough today.  - incentive spirometry - continue to monitor        Acute deep vein thrombosis (DVT) of axillary vein of right upper  extremity (HCC)     DVT noted on R upper extremity. Patient R arm is not swollen and  nontender.  - lovenox 1mg /kg q12h         Pressure injury of contiguous region involving left buttock and hip,  stage 4 (HCC)    FEN/GI: Full PPx: Lovenox Dispo:Likely SNF pending clinical improvement  Subjective:  Patient states he is doing okay this morning. He reports feeling hot and cold overnight but did not have chills. He reports he has been doing exercises on his shoulder and would like PT to see it and help him work on it.    Objective: Temp:  [99.1 F (37.3 C)-101.7 F (38.7 C)] 99.2 F (37.3 C) (07/17 0810) Pulse Rate:  [96-112] 107 (07/17 0810) Resp:  [17-32] 17 (07/17 0810) BP: (117-128)/(56-70) 125/63 (07/17 0810) SpO2:  [94 %-98 %] 96 % (07/17 0810) Physical Exam: General: resting comfortably  Cardiovascular: tachycardic, regular rate no M/R/G Respiratory: breathing comfortably on RA, slight wheezing on expiration bilaterally Abdomen: soft, nontender, slightly distended Extremities: muscle wasting to lower extremities in setting of spina bfida, right arm with limited ROM   Laboratory: Most recent CBC Lab Results  Component Value Date   WBC 10.8 (H) 08/06/2022   HGB 7.3 (L) 08/06/2022   HCT 25.4 (L) 08/06/2022   MCV 77.2 (L) 08/06/2022   PLT 502 (H) 08/06/2022   Most recent BMP    Latest Ref Rng & Units 08/05/2022    4:05 AM  BMP  Glucose 70 - 99 mg/dL 086   BUN 6 - 20 mg/dL 9   Creatinine 5.78 - 4.69 mg/dL 6.29   Sodium 528 - 413 mmol/L 139   Potassium 3.5 - 5.1 mmol/L 3.7   Chloride 98 - 111 mmol/L 106   CO2 22 - 32 mmol/L 23   Calcium 8.9 - 10.3 mg/dL 7.9     ESR: 244 CRP: 01.0 Iron studies:  Iron---> 7 Ferritin--> 189   Imaging/Diagnostic Tests: No new imaging Cyd Silence, MD 08/06/2022, 10:56 AM  PGY-1, [facility] FPTS Intern pager: [phone], text pages welcome Secure chat group [facility] Teaching Service

## 2022-08-06 NOTE — Progress Notes (Signed)
[facility]    Date of Admission:  07/25/2022      ID: Anthony [name] is a 52 y.o. male with   Principal Problem:   Pressure injury, stage 4, with infection (HCC) Active Problems:   Acute Right shoulder pain   Decubitus ulcer due to spina bifida (HCC)   Paraplegia (HCC)   Fever   Anemia of infection and chronic disease   Polymicrobial bacterial infection   Chronic osteomyelitis of pelvic region (HCC)   Cough   Acute deep vein thrombosis (DVT) of axillary vein of right upper extremity (HCC)   Pressure injury of contiguous region involving left buttock and hip, stage 4 (HCC)    Subjective: Still having fevers but less so. Still remains to have good appetite  Discussed benefits of diverting colostomy to prevent wound contaminatoin.  Medications:   sodium chloride   Intravenous Once   ascorbic acid  500 mg Oral BID   Chlorhexidine Gluconate Cloth  6 each Topical Daily   enoxaparin (LOVENOX) injection  1 mg/kg Subcutaneous Q12H   feeding supplement  237 mL Oral TID BM   flecainide  50 mg Oral BID   lidocaine  1-2 patch Transdermal Q24H   loratadine  10 mg Oral Daily   methocarbamol  1,000 mg Oral Q8H   multivitamin with minerals  1 tablet Oral Daily   sodium chloride flush  10-40 mL Intracatheter Q12H   zinc sulfate  220 mg Oral Daily    Objective: Vital signs in last 24 hours: Temp:  [99.1 F (37.3 C)-100.7 F (38.2 C)] 100.7 F (38.2 C) (07/17 1622) Pulse Rate:  [96-118] 118 (07/17 1622) Resp:  [17-30] 25 (07/17 1622) BP: (119-134)/(61-79) 122/78 (07/17 1622) SpO2:  [96 %-98 %] 97 % (07/17 1622)  Physical Exam  Constitutional: He is oriented to person, place, and time. He appears well-developed and well-nourished. No distress.  HENT:  Mouth/Throat: Oropharynx is clear and moist. No oropharyngeal exudate.  Cardiovascular: Normal rate, regular rhythm and normal heart sounds. Exam reveals no gallop and no friction rub.  No murmur heard.   Pulmonary/Chest: Effort normal and breath sounds normal. No respiratory distress. He has no wheezes.  Abdominal: Soft. Bowel sounds are normal. He exhibits no distension. There is no tenderness.  Lymphadenopathy:  He has no cervical adenopathy.  Neurological: He is alert and oriented to person, place, and time.  Skin: Skin is warm and dry. No rash noted. No erythema.  Psychiatric: He has a normal mood and affect. His behavior is normal.    Lab Results Recent Labs    08/04/22 0530 08/05/22 0405 08/06/22 0450  WBC 9.9 11.9* 10.8*  HGB 7.4* 7.6* 7.3*  HCT 25.5* 25.9* 25.4*  NA 137 139  --   K 3.7 3.7  --   CL 106 106  --   CO2 25 23  --   BUN 11 9  --   CREATININE 0.46* 0.42*  --    Liver Panel No results for input(s): "PROT", "ALBUMIN", "AST", "ALT", "ALKPHOS", "BILITOT", "BILIDIR", "IBILI" in the last 72 hours. Sedimentation Rate Recent Labs    08/06/22 0450  ESRSEDRATE 140*   C-Reactive Protein Recent Labs    08/06/22 0450  CRP 16.4*    Microbiology: reviewed Studies/Results: No results found.   Assessment/Plan: Fever curse slightly less.  Contineu on daptomycin and piptazo for the time being Patient agreeable to diverting colostomy for Friday Recommend to see if orthopedics/dr duda will to  discuss further debridement for source control  [facility] Pager: [phone]  08/06/2022, 4:28 PM

## 2022-08-06 NOTE — Plan of Care (Signed)

## 2022-08-07 DIAGNOSIS — L8994 Pressure ulcer of unspecified site, stage 4: Secondary | ICD-10-CM | POA: Diagnosis not present

## 2022-08-07 DIAGNOSIS — L089 Local infection of the skin and subcutaneous tissue, unspecified: Secondary | ICD-10-CM | POA: Diagnosis not present

## 2022-08-07 LAB — CBC
HCT: 26.5 % — ABNORMAL LOW (ref 39.0–52.0)
Hemoglobin: 7.6 g/dL — ABNORMAL LOW (ref 13.0–17.0)
MCH: 21.9 pg — ABNORMAL LOW (ref 26.0–34.0)
MCHC: 28.7 g/dL — ABNORMAL LOW (ref 30.0–36.0)
MCV: 76.4 fL — ABNORMAL LOW (ref 80.0–100.0)
Platelets: 591 10*3/uL — ABNORMAL HIGH (ref 150–400)
RBC: 3.47 MIL/uL — ABNORMAL LOW (ref 4.22–5.81)
RDW: 24.3 % — ABNORMAL HIGH (ref 11.5–15.5)
WBC: 9.2 10*3/uL (ref 4.0–10.5)
nRBC: 0 % (ref 0.0–0.2)

## 2022-08-07 LAB — BASIC METABOLIC PANEL
Anion gap: 5 (ref 5–15)
BUN: 7 mg/dL (ref 6–20)
CO2: 25 mmol/L (ref 22–32)
Calcium: 8.1 mg/dL — ABNORMAL LOW (ref 8.9–10.3)
Chloride: 105 mmol/L (ref 98–111)
Creatinine, Ser: 0.48 mg/dL — ABNORMAL LOW (ref 0.61–1.24)
GFR, Estimated: >60 mL/min (ref >60)
Glucose, Bld: 123 mg/dL — ABNORMAL HIGH (ref 70–99)
Potassium: 3.3 mmol/L — ABNORMAL LOW (ref 3.5–5.1)
Sodium: 135 mmol/L (ref 135–145)

## 2022-08-07 LAB — TYPE AND SCREEN
ABO/RH(D): O POS
Antibody Screen: POSITIVE
DAT, IgG: NEGATIVE
Donor AG Type: NEGATIVE
Donor AG Type: NEGATIVE
Unit division: 0
Unit division: 0

## 2022-08-07 LAB — CULTURE, BLOOD (ROUTINE X 2)
Culture: NO GROWTH
Culture: NO GROWTH
Special Requests: ADEQUATE

## 2022-08-07 LAB — BPAM RBC
Blood Product Expiration Date: 202408082359
Blood Product Expiration Date: 202408122359
Unit Type and Rh: 5100
Unit Type and Rh: 5100

## 2022-08-07 MED ORDER — HYDROXYZINE HCL 10 MG PO TABS
10.0000 mg | ORAL_TABLET | Freq: Two times a day (BID) | ORAL | Status: DC | PRN
  Administered 2022-08-07 – 2022-08-10 (×5): 10 mg via ORAL
  Filled 2022-08-07 (×5): qty 1

## 2022-08-07 MED ORDER — POTASSIUM CHLORIDE CRYS ER 20 MEQ PO TBCR
40.0000 meq | EXTENDED_RELEASE_TABLET | Freq: Once | ORAL | Status: AC
  Administered 2022-08-07: 40 meq via ORAL
  Filled 2022-08-07: qty 2

## 2022-08-07 NOTE — Assessment & Plan Note (Signed)
XR negative for fracture. Patient reports PT came by yesterday and was helpful - PT/OT following - multimodal pain management with roxabin, volataren, topical lidocaine and prn tylenol

## 2022-08-07 NOTE — Assessment & Plan Note (Signed)
Patient cleans and packs his own wounds regularly. Ortho recommended more frequent packing dressing changes of left hip wound with follow-up o/p. Will continue with aggressive wound care and IV antibiotics  - Wound care on board - ID on board

## 2022-08-07 NOTE — Assessment & Plan Note (Signed)
Polymicrobial bacterial infection of both gram-positive and negative bacteria resulting from chronic decubitus ulcers due to paraplegia from lumbar spina bifida. Patient has maintained blood pressures. Echo 7/6 without evidence of endocarditis. Repeat [name] 7/15 showed no vegetations . Ortho and urology recommend aggressive wound care but no surgical intervention at this time. Suprapubic catheter 7/10. Dr. [name] 7/15 thinks there is stool in his wounds and recommended general surgery evaluate patient for diverting ostomy. General surgery has recommended diverting colostomy.  - scheduled for diverting colostomy 7/19 (NPO @ [facility])  - Daptomycin (7/5 - ) - Zosyn started today per ID recs  - IV Cefepime (7/5 - 7/16) - Flagyl (7/5 -7/16 )   - BCID: strep spp + proteus, no grown >2 days  - ibuprofen prn for fevers

## 2022-08-07 NOTE — Assessment & Plan Note (Signed)
DVT noted on R upper extremity. Patient R arm is not swollen and nontender, though is complaining of some increased discomfort. - holding lovenox 1mg /kg q12h in the setting of surgery tomorrow - continue to monitor

## 2022-08-07 NOTE — Progress Notes (Signed)
3 Days Post-Op  Subjective: CC: Fever overnight.  No abdominal pain, n/v.  Reports his wounds were just changed by staff.   Objective: Vital signs in last 24 hours: Temp:  [98.3 F (36.8 C)-102.4 F (39.1 C)] 98.3 F (36.8 C) (07/18 0700) Pulse Rate:  [90-121] 90 (07/18 0700) Resp:  [18-27] 20 (07/18 0700) BP: (109-134)/(64-80) 109/64 (07/18 0700) SpO2:  [95 %-100 %] 95 % (07/18 0700) Last BM Date : 08/06/22  Intake/Output from previous day: 07/17 0701 - 07/18 0700 In: 150 [IV Piggyback:150] Out: 3900 [Urine:3900] Intake/Output this shift: Total I/O In: -  Out: 450 [Urine:450]  PE Gen:  Alert, NAD, pleasant Abd: Soft, ND, NT. Ostomy sites marked.  I will defer looking at wounds today given these were just changed by staff  Lab Results:  Recent Labs    08/06/22 0450 08/07/22 0926  WBC 10.8* 9.2  HGB 7.3* 7.6*  HCT 25.4* 26.5*  PLT 502* 591*   BMET Recent Labs    08/05/22 0405 08/07/22 0926  NA 139 135  K 3.7 3.3*  CL 106 105  CO2 23 25  GLUCOSE 109* 123*  BUN 9 7  CREATININE 0.42* 0.48*  CALCIUM 7.9* 8.1*   PT/INR No results for input(s): "LABPROT", "INR" in the last 72 hours. CMP     Component Value Date/Time   NA 135 08/07/2022 0926   NA 141 03/26/2021 1356   K 3.3 (L) 08/07/2022 0926   CL 105 08/07/2022 0926   CO2 25 08/07/2022 0926   GLUCOSE 123 (H) 08/07/2022 0926   BUN 7 08/07/2022 0926   BUN 7 03/26/2021 1356   CREATININE 0.48 (L) 08/07/2022 0926   CREATININE 1.00 03/09/2014 1633   CALCIUM 8.1 (L) 08/07/2022 0926   PROT 5.3 (L) 07/26/2022 0720   PROT 8.0 03/26/2021 1356   ALBUMIN <1.5 (L) 07/26/2022 0720   ALBUMIN 4.3 03/26/2021 1356   AST 53 (H) 07/26/2022 0720   ALT 33 07/26/2022 0720   ALKPHOS 177 (H) 07/26/2022 0720   BILITOT 1.0 07/26/2022 0720   BILITOT 0.5 03/26/2021 1356   GFRNONAA >60 08/07/2022 0926   GFRAA >60 01/31/2017 0543   Lipase     Component Value Date/Time   LIPASE 29 11/22/2019 2059     Studies/Results: No results found.  Anti-infectives: Anti-infectives (From admission, onward)    Start     Dose/Rate Route Frequency Ordered Stop   08/05/22 1500  piperacillin-tazobactam (ZOSYN) IVPB 3.375 g  Status:  Discontinued        3.375 g 100 mL/hr over 30 Minutes Intravenous Every 8 hours 08/05/22 0943 08/05/22 0944   08/05/22 1500  piperacillin-tazobactam (ZOSYN) IVPB 3.375 g        3.375 g 12.5 mL/hr over 240 Minutes Intravenous Every 8 hours 08/05/22 0945     07/28/22 2200  metroNIDAZOLE (FLAGYL) tablet 500 mg  Status:  Discontinued        500 mg Oral Every 12 hours 07/28/22 1412 08/05/22 0943   07/26/22 1800  ceFEPIme (MAXIPIME) 2 g in sodium chloride 0.9 % 100 mL IVPB  Status:  Discontinued        2 g 200 mL/hr over 30 Minutes Intravenous Every 8 hours 07/26/22 1745 08/05/22 0943   07/25/22 2100  ceFEPIme (MAXIPIME) 2 g in sodium chloride 0.9 % 100 mL IVPB  Status:  Discontinued        2 g 200 mL/hr over 30 Minutes Intravenous Every 12 hours 07/25/22 0926 07/26/22 1745  07/25/22 2100  metroNIDAZOLE (FLAGYL) IVPB 500 mg  Status:  Discontinued        500 mg 100 mL/hr over 60 Minutes Intravenous Every 12 hours 07/25/22 1900 07/28/22 1412   07/25/22 1700  DAPTOmycin (CUBICIN) 650 mg in sodium chloride 0.9 % IVPB        8 mg/kg  79.4 kg 126 mL/hr over 30 Minutes Intravenous Daily 07/25/22 1546     07/25/22 0800  ceFEPIme (MAXIPIME) 2 g in sodium chloride 0.9 % 100 mL IVPB        2 g 200 mL/hr over 30 Minutes Intravenous  Once 07/25/22 0758 07/25/22 0942   07/25/22 0800  metroNIDAZOLE (FLAGYL) IVPB 500 mg        500 mg 100 mL/hr over 60 Minutes Intravenous  Once 07/25/22 0758 07/25/22 1159        Assessment/Plan Spina bfida with paraplegia and chronic sacral ulcers and osteomyelitis  Consult for fecal diversion  - Defer tx of Osteo + bacteremia to ID and possible septic joints to Ortho. He has been seen by Urology for perineal wound. Agree that this does not  look like it needs urgent/emergent surgery/debridement at this time. They are planning follow up with Urology/plastic in the future. I did not see an obvious fistula on exam. Wound care as outlined by WOCN.  - Agree with plan for fecal diversion. Discussed plan for diverting colostomy with the patient. The planned procedure and material risks were discussed with the patient. See discussion on 7/17. The patient's questions were answered to their satisfaction, they voiced understanding and elected to proceed with surgery. Plan OR in the AM. Appreciate WOCN ostomy marking.   FEN - NPO at midnight.  VTE - SCDs, hold Lovenox after midnight. Primary team reports they are having PRBC ready ahead for OR incase as patients blood has to come from [facility].  ID - On Daptomycin and Zosyn. This should be adequate peri-op coverage Foley - SP tube (chronic) Plan - OR in the AM  I reviewed nursing notes, last 24 h vitals and pain scores, last 48 h intake and output, last 24 h labs and trends, and last 24 h imaging results.   LOS: 12 days    Jacinto Halim , [facility] Surgery 08/07/2022, 11:05 AM Please see Amion for pager number during day hours 7:00am-4:30pm

## 2022-08-07 NOTE — Assessment & Plan Note (Signed)
Patient has been complaining of some increased shortness of breath and cough.  He has also been tachycardic to the 130s. Concern for PE, CT angiogram was negative. Chest xray negative. Reports no cough today.  - incentive spirometry - continue to monitor

## 2022-08-07 NOTE — Progress Notes (Signed)
Daily Progress Note Intern Pager: [phone]  Patient name: Anthony [name] Medical record number: [id number] Date of birth: 17-Nov-1970 Age: 52 y.o. Gender: male  Primary Care Provider: Erick Alley, DO Consultants: ID, Cardiology, Dr. [name] (Orthopaedics), General Surgery  Code Status: Full   Pt Overview and Major Events to Date:  7/5: Admitted 7/6: Ortho consulted, stated no acute intervention indicated. ID consulted, recommended BSA bx 7/7: Hg dropped and patient received 1x pRBCs  7/8: Hg dropped again, received another unit of pRBC, [name] ordered per ID recs  7/9: replaced catheter 7/12: received 1x pRBC 7/15: [name]    Assessment and Plan: basel defalco is a 52 yo M with PMHx of spina bfida with paraplegia and chronic sacral ulcers admitted for septic shock 2/2 to infected sacral ulcers and osteomyelitis as supported by CT and MRI.  [facility]     * (Principal) Pressure injury, stage 4, with infection (HCC)     Acute Right shoulder pain     XR negative for fracture. Patient reports PT came by yesterday and was  helpful - PT/OT following - multimodal pain management with roxabin, volataren, topical lidocaine  and prn tylenol         Decubitus ulcer due to spina bifida Anthony [name])     Patient cleans and packs his own wounds regularly. Ortho recommended  more frequent packing dressing changes of left hip wound with follow-up  o/p. Will continue with aggressive wound care and IV antibiotics.  - Wound care on board - ID on board         Paraplegia (HCC)     Anemia of infection and chronic disease     Has received 3 units pRBCS during this admission. There is a questions  whether the decrease in Hgb is d/t dilution vs anemia 2/2 to sepsis.  Stable today Hg has been stable s/p RBC transfusion. Iron studies show  decreased iron and TIBC with normal ferritin, likely in the setting on  anemia of chronic disease.  - new Type & Screen - Based on his CBC, if Hgb < 7.5, will  order more blood from [name] to  administer prior to [name] - Daily CBC        Polymicrobial bacterial infection     Polymicrobial bacterial infection of both gram-positive and negative  bacteria resulting from chronic decubitus ulcers due to paraplegia from  lumbar spina bifida. Patient has maintained blood pressures. Echo 7/6  without evidence of endocarditis. Repeat [name] 7/15 showed no vegetations .  Ortho and urology recommend aggressive wound care but no surgical  intervention at this time. Suprapubic catheter 7/10. Dr. [name] 7/15 thinks  there is stool in his wounds and recommended general surgery evaluate  patient for diverting ostomy. General surgery has recommended diverting  colostomy.  - scheduled for diverting colostomy 7/19 (NPO @ [facility])  - Daptomycin (7/5 - ) - Zosyn started today per ID recs  - IV Cefepime (7/5 - 7/16) - Flagyl (7/5 -7/16 )   - BCID: strep spp + proteus, no grown >2 days  - ibuprofen prn for fevers        Chronic osteomyelitis of pelvic region (HCC)     Cough     Patient has been complaining of some increased shortness of breath and  cough.  He has also been tachycardic to the 130s. Concern for PE, CT  angiogram was negative. Chest xray negative. Reports no cough today.  -  incentive spirometry - continue to monitor        Acute deep vein thrombosis (DVT) of axillary vein of right upper  extremity (HCC)     DVT noted on R upper extremity. Patient R arm is not swollen and  nontender, though is complaining of some increased discomfort. - holding lovenox 1mg /kg q12h in the setting of surgery tomorrow - continue to monitor        Pressure injury of contiguous region involving left buttock and hip,  stage 4 (HCC)    FEN/GI: Full, NPO @ [facility] PPx: lovenox, stopped today for sgx tomorrow Dispo:pending clinical improvement  Subjective:  Patient reports he is doing well today. He states he has not had any chills overnight. He reports his fever is better  and he is feeling okay. He reports his R arm is feeling "restless" and it was difficult for him to sleep overnight. He reports he is feeling a bit anxious about this surgery and that contributed to lack of sleep. No other complaints at this time.   Objective: Temp:  [98.3 F (36.8 C)-102.4 F (39.1 C)] 98.6 F (37 C) (07/18 1100) Pulse Rate:  [90-121] 103 (07/18 1100) Resp:  [18-27] 20 (07/18 1100) BP: (106-134)/(56-80) 106/56 (07/18 1100) SpO2:  [95 %-100 %] 99 % (07/18 1100) Physical Exam: General: resting comfortably  Cardiovascular: tachycardic, regular rate no M/R/G Respiratory: breathing comfortably on RA, Abdomen: NT, soft, slight distention  Extremities: R extremity is not warmer than left, no obvious swelling or erythema   Laboratory: Most recent CBC Lab Results  Component Value Date   WBC 9.2 08/07/2022   HGB 7.6 (L) 08/07/2022   HCT 26.5 (L) 08/07/2022   MCV 76.4 (L) 08/07/2022   PLT 591 (H) 08/07/2022   Most recent BMP    Latest Ref Rng & Units 08/07/2022    9:26 AM  BMP  Glucose 70 - 99 mg/dL 161   BUN 6 - 20 mg/dL 7   Creatinine 0.96 - 0.45 mg/dL 4.09   Sodium 811 - 914 mmol/L 135   Potassium 3.5 - 5.1 mmol/L 3.3   Chloride 98 - 111 mmol/L 105   CO2 22 - 32 mmol/L 25   Calcium 8.9 - 10.3 mg/dL 8.1       Imaging/Diagnostic Tests: No new imaging Cyd Silence, MD 08/07/2022, 12:48 PM  PGY-1, [facility] FPTS Intern pager: [phone], text pages welcome Secure chat group [name] Teaching Service

## 2022-08-07 NOTE — Assessment & Plan Note (Addendum)
Has received 3 units pRBCS during this admission. There is a questions whether the decrease in Hgb is d/t dilution vs anemia 2/2 to sepsis. Stable today Hg has been stable s/p RBC transfusion. Iron studies show decreased iron and TIBC with normal ferritin, likely in the setting on anemia of chronic disease.  - new Type & Screen - Based on his CBC, if Hgb < 7.5, will order more blood from [facility] to administer prior to [name] - Daily CBC

## 2022-08-08 DIAGNOSIS — A499 Bacterial infection, unspecified: Secondary | ICD-10-CM | POA: Diagnosis not present

## 2022-08-08 LAB — BPAM RBC: Blood Product Expiration Date: 202408082359

## 2022-08-08 LAB — CBC
HCT: 29.8 % — ABNORMAL LOW (ref 39.0–52.0)
Hemoglobin: 8.5 g/dL — ABNORMAL LOW (ref 13.0–17.0)
MCH: 22.3 pg — ABNORMAL LOW (ref 26.0–34.0)
MCHC: 28.5 g/dL — ABNORMAL LOW (ref 30.0–36.0)
MCV: 78.2 fL — ABNORMAL LOW (ref 80.0–100.0)
Platelets: 758 10*3/uL — ABNORMAL HIGH (ref 150–400)
RBC: 3.81 MIL/uL — ABNORMAL LOW (ref 4.22–5.81)
RDW: 24.5 % — ABNORMAL HIGH (ref 11.5–15.5)
WBC: 14.5 10*3/uL — ABNORMAL HIGH (ref 4.0–10.5)
nRBC: 0 % (ref 0.0–0.2)

## 2022-08-08 LAB — VITAMIN A: Vitamin A (Retinoic Acid): 21.3 ug/dL (ref 20.1–62.0)

## 2022-08-08 LAB — TYPE AND SCREEN
ABO/RH(D): O POS
Antibody Screen: POSITIVE
Donor AG Type: NEGATIVE
Unit division: 0

## 2022-08-08 LAB — BASIC METABOLIC PANEL
Anion gap: 9 (ref 5–15)
BUN: 9 mg/dL (ref 6–20)
CO2: 24 mmol/L (ref 22–32)
Calcium: 8.5 mg/dL — ABNORMAL LOW (ref 8.9–10.3)
Chloride: 103 mmol/L (ref 98–111)
Creatinine, Ser: 0.41 mg/dL — ABNORMAL LOW (ref 0.61–1.24)
GFR, Estimated: >60 mL/min (ref >60)
Glucose, Bld: 102 mg/dL — ABNORMAL HIGH (ref 70–99)
Potassium: 3.9 mmol/L (ref 3.5–5.1)
Sodium: 136 mmol/L (ref 135–145)

## 2022-08-08 LAB — VITAMIN C: Vitamin C: 0.4 mg/dL (ref 0.4–2.0)

## 2022-08-08 LAB — VITAMIN E
Vitamin E (Alpha Tocopherol): 15.9 mg/L (ref 7.0–25.1)
Vitamin E(Gamma Tocopherol): 1.7 mg/L (ref 0.5–5.5)

## 2022-08-08 LAB — ZINC: Zinc: 51 ug/dL (ref 44–115)

## 2022-08-08 LAB — COPPER, SERUM: Copper: 98 ug/dL (ref 69–132)

## 2022-08-08 LAB — CK: Total CK: 7 U/L — ABNORMAL LOW (ref 49–397)

## 2022-08-08 MED ORDER — ENOXAPARIN SODIUM 80 MG/0.8ML IJ SOSY
1.0000 mg/kg | PREFILLED_SYRINGE | Freq: Two times a day (BID) | INTRAMUSCULAR | Status: DC
  Administered 2022-08-08 – 2022-08-11 (×6): 80 mg via SUBCUTANEOUS
  Filled 2022-08-08 (×5): qty 0.8

## 2022-08-08 NOTE — Assessment & Plan Note (Signed)
Polymicrobial bacterial infection of both gram-positive and negative bacteria resulting from chronic decubitus ulcers due to paraplegia from lumbar spina bifida. Patient has maintained blood pressures. [name] 7/15 showed no vegetations. Suprapubic catheter exchanged 7/10. Dr. [name] 7/15 thinks there is stool in his wounds and recommended general surgery evaluate patient for diverting ostomy. General surgery has recommended diverting colostomy. IV Cefepime (7/5 - 7/16) & Flagyl (7/5 -7/16 ) completed.  - Diverting colostomy rescheduled for next week per gen surg - Daptomycin (7/5 - ) - Zosyn (7/18 - )  - BCID: strep spp + proteus, no growth over 5 days  - Ibuprofen PRN for fevers

## 2022-08-08 NOTE — Assessment & Plan Note (Signed)
Patient cleans and packs his own wounds regularly. Ortho recommended more frequent packing dressing changes of left hip wound with follow-up o/p. Will continue with aggressive wound care and IV antibiotics  - Wound care on board - ID on board

## 2022-08-08 NOTE — Progress Notes (Signed)
Pharmacy Antibiotic Note  Anthony [name] is a 52 y.o. male admitted on 07/25/2022 with bacteremia from chronic osteo / decubitus ulcer infection.  Pharmacy has been consulted for Daptomycin dosing. Planning for OR 7/19 for diverting colostomy. [name] neg for vegetation.  Plan: Continue daptomycin 650 mg IV q24h (~8 mg/kg) Continue zosyn 3.375g IV q8 hours per ID recs Weekly CK on Fridays  Pharmacy will write weekly progress note unless renal function or clinical course changes   Height: 5\' 8"  (172.7 cm) Weight: 79.4 kg (175 lb) IBW/kg (Calculated) : 68.4  Temp (24hrs), Avg:98.8 F (37.1 C), Min:98.3 F (36.8 C), Max:99.6 F (37.6 C)  Recent Labs  Lab 08/03/22 0310 08/04/22 0530 08/05/22 0405 08/06/22 0450 08/07/22 0926 08/08/22 1350  WBC 12.6* 9.9 11.9* 10.8* 9.2 14.5*  CREATININE 0.42* 0.46* 0.42*  --  0.48* 0.41*    Estimated Creatinine Clearance: 104.5 mL/min (A) (by C-G formula based on SCr of 0.41 mg/dL (L)).    Allergies  Allergen Reactions   Firvanq [Vancomycin] Itching    Antimicrobials this admission: Cefepime 7/5 7/16  Daptomycin 7/5 >> Flagyl 7/5 >> 7/16 Zosyn 7/16 >>   CK Monitoring: 7/6 CK 58  7/13 CK 9 7/19 CK 7  Microbiology results: 7/6 BCx x 2: neg 7/5 BCx x2: Proteus vulgaris (R: Amp, S: Cefepime, Ceftaz, Cipro, Bactrim).  Strep Dysgalactiae 7/5 BCID: Strep sp + Proteus sp  Thank you for involving pharmacy in this patient's care.  Rexford Maus, PharmD, BCPS 08/08/2022 3:23 PM

## 2022-08-08 NOTE — Assessment & Plan Note (Signed)
XR negative for fracture. - PT/OT following - multimodal pain management - Roxabin, Voltaren, topical Lidocaine and PRN Tylenol

## 2022-08-08 NOTE — Assessment & Plan Note (Signed)
XR negative for fracture. - PT/OT following - multimodal pain management with roxabin, volataren, topical lidocaine and prn tylenol

## 2022-08-08 NOTE — Assessment & Plan Note (Signed)
Has received 3 units pRBCS during this admission. There is a questions whether the decrease in Hgb is d/t dilution vs anemia 2/2 to sepsis. Stable today Hg has been stable s/p RBC transfusion. Iron studies show decreased iron and TIBC with normal ferritin, likely in the setting on anemia of chronic disease.  - Based on his CBC, if Hgb < 7.5, will order more blood from [place] (2 units in house)  - Daily CBC

## 2022-08-08 NOTE — Progress Notes (Signed)
Occupational Therapy Treatment Patient Details Name: Anthony [name] MRN: [id number] DOB: [birth year] Today's Date: 08/08/2022   History of present illness Pt is a 52 y.o. male who presented 07/25/22 with weakness and a fall. Pt admitted with severe sepsis secondary to sacral decubitus ulcer infection with concern for hip septic arthritis noted on CT. Imaging of R shoulder showed moderate acromioclavicular OA & mild distal lasteral subacromial spurring. PMH: complete paraplegia, multiple toe amputations, chronic sacral wounds, neurogenic bladder with suprapubic catheter, spina bifida, HTN   OT comments  Pt to have colostomy today, agreeable to R shoulder AAROM exercises in supine within pain tolerance (90 degrees). Worked on strengthening around scapula and facilitated rotation with FF and abduction. Reinforced self ROM 3 x a day. Pt continues to lead with L UE during ADLs, but is using R UE as a functional assist when opening containers, managing bed linens. Patient will benefit from continued inpatient follow up therapy, <3 hours/day.    Recommendations for follow up therapy are one component of a multi-disciplinary discharge planning process, led by the attending physician.  Recommendations may be updated based on patient status, additional functional criteria and insurance authorization.    Assistance Recommended at Discharge Frequent or constant Supervision/Assistance  Patient can return home with the following  A lot of help with bathing/dressing/bathroom;Assistance with cooking/housework;Assistance with feeding;Direct supervision/assist for medications management;Assist for transportation;Help with stairs or ramp for entrance;A little help with walking and/or transfers   Equipment Recommendations  Hospital bed    Recommendations for Other Services      Precautions / Restrictions Precautions Precautions: Fall;Other (comment) Precaution Comments: paraplegia       Mobility Bed  Mobility Overal bed mobility: Needs Assistance   Rolling: Min guard         General bed mobility comments: with bed rail for repositioning    Transfers                         Balance                                           ADL either performed or assessed with clinical judgement   ADL                                              Extremity/Trunk Assessment Upper Extremity Assessment RUE Deficits / Details: AAROM FF, ABD within pain tolerance in supine with scapular rotation facilitation. Reviewed self ROM exercises for L UE to assist R UE RUE Coordination: decreased gross motor            Vision       Perception     Praxis      Cognition Arousal/Alertness: Awake/alert Behavior During Therapy: WFL for tasks assessed/performed Overall Cognitive Status: Within Functional Limits for tasks assessed                                          Exercises      Shoulder Instructions       General Comments      Pertinent Vitals/ Pain       Pain Assessment  Pain Assessment: Faces Faces Pain Scale: Hurts little more Pain Location: R shoulder with ROM >90 degrees Pain Descriptors / Indicators: Grimacing, Aching, Guarding, Sore Pain Intervention(s): Monitored during session, Repositioned, Premedicated before session  Home Living                                          Prior Functioning/Environment              Frequency  Min 1X/week        Progress Toward Goals  OT Goals(current goals can now be found in the care plan section)  Progress towards OT goals: Progressing toward goals  Acute Rehab OT Goals OT Goal Formulation: With patient Time For Goal Achievement: 08/09/22 Potential to Achieve Goals: Good  Plan Discharge plan remains appropriate    Co-evaluation                 AM-PAC OT "6 Clicks" Daily Activity     Outcome Measure   Help from another  person eating meals?: None Help from another person taking care of personal grooming?: A Little Help from another person toileting, which includes using toliet, bedpan, or urinal?: Total Help from another person bathing (including washing, rinsing, drying)?: A Lot Help from another person to put on and taking off regular upper body clothing?: A Lot Help from another person to put on and taking off regular lower body clothing?: Total 6 Click Score: 13    End of Session    OT Visit Diagnosis: Muscle weakness (generalized) (M62.81);Pain Pain - Right/Left: Right Pain - part of body: Shoulder   Activity Tolerance Patient tolerated treatment well   Patient Left in bed;with call bell/phone within reach   Nurse Communication          Time: 0850-0920 OT Time Calculation (min): 30 min  Charges: OT General Charges $OT Visit: 1 Visit OT Treatments $Therapeutic Exercise: 23-37 mins  Berna Spare, OTR/L Acute Rehabilitation Services Office: [phone]   Evern Bio 08/08/2022, 10:40 AM

## 2022-08-08 NOTE — Assessment & Plan Note (Signed)
Patient was complaining of some increased shortness of breath and cough.  He has also been tachycardic to the 130s. There was concern for PE, but CTA was negative. Seems to have improved. - Continue incentive spirometry - Continue to monitor

## 2022-08-08 NOTE — Assessment & Plan Note (Addendum)
DVT noted on R upper extremity. Patient R arm is not swollen and nontender. - holding lovenox 1mg /kg q12h in the setting of surgery, will restart if surgery is postponed  - continue to monitor

## 2022-08-08 NOTE — Assessment & Plan Note (Signed)
DVT noted on R upper extremity. Patient R arm is not swollen and nontender. - Restarted Lovenox 1mg /kg q12h due to surgery being postponed - Continue to monitor

## 2022-08-08 NOTE — Assessment & Plan Note (Signed)
Has received 3 units pRBCS during this admission. There is a questions whether the decrease in Hgb is d/t dilution vs anemia 2/2 to sepsis. Stable today Hg has remained stable. Iron studies show decreased iron and TIBC with normal ferritin, likely in the setting on anemia of chronic disease.  - Based on his CBC, if Hgb < 7.5, will order more blood from [place] (2 units in house)  - Daily CBC

## 2022-08-08 NOTE — Assessment & Plan Note (Signed)
Polymicrobial bacterial infection of both gram-positive and negative bacteria resulting from chronic decubitus ulcers due to paraplegia from lumbar spina bifida. Patient has maintained blood pressures. [name] 7/15 showed no vegetations. Suprapubic catheter exchanged 7/10. Dr. [name] 7/15 thinks there is stool in his wounds and recommended general surgery evaluate patient for diverting ostomy. General surgery has recommended diverting colostomy. IV Cefepime (7/5 - 7/16) & Flagyl (7/5 -7/16 ) finished  - scheduled for diverting colostomy today, patient NPO - Daptomycin (7/5 - ) - Zosyn (7/18 - )  - BCID: strep spp + proteus, no grown >2 days  - ibuprofen prn for fevers

## 2022-08-08 NOTE — Assessment & Plan Note (Signed)
Patient has been complaining of some increased shortness of breath and cough.  He has also been tachycardic to the 130s. Concern for PE, CT angiogram was negative. - incentive spirometry - continue to monitor

## 2022-08-08 NOTE — Progress Notes (Signed)
Daily Progress Note Intern Pager: [phone]  Patient name: Anthony [name] Medical record number: [id number] Date of birth: April 09, 1970 Age: 52 y.o. Gender: male  Primary Care Provider: Erick Alley, DO Consultants: ID, Cardiology, Anthony [name] (ortho), General Surgery  Code Status: Full   Pt Overview and Major Events to Date:  7/5: Admitted 7/6: Ortho consulted, stated no acute intervention indicated. ID consulted, recommended BSA bx 7/7: Hg dropped and patient received 1x pRBCs  7/8: Hg dropped again, received another unit of pRBC, [name] ordered per ID recs  7/9: replaced catheter 7/12: received 1x pRBC 7/15: [name] 7/19: scheduled for surgery   Assessment and Plan: Anthony [name] is a 52 yo M with PMHx of spina bfida with paraplegia and chronic sacral ulcers admitted for septic shock 2/2 to infected sacral ulcers and osteomyelitis as supported by CT and MRI.  [facility]     * (Principal) Polymicrobial bacterial infection     Polymicrobial bacterial infection of both gram-positive and negative  bacteria resulting from chronic decubitus ulcers due to paraplegia from  lumbar spina bifida. Patient has maintained blood pressures. [name] 7/15  showed no vegetations. Suprapubic catheter exchanged 7/10. Anthony [name] 7/15  thinks there is stool in his wounds and recommended general surgery  evaluate patient for diverting ostomy. General surgery has recommended  diverting colostomy. IV Cefepime (7/5 - 7/16) & Flagyl (7/5 -7/16 )  finished  - scheduled for diverting colostomy today, patient NPO - Daptomycin (7/5 - ) - Zosyn (7/18 - )  - BCID: strep spp + proteus, no grown >2 days  - ibuprofen prn for fevers        Acute Right shoulder pain     XR negative for fracture. - PT/OT following - multimodal pain management with roxabin, volataren, topical lidocaine  and prn tylenol         Decubitus ulcer due to spina bifida Anthony [name])     Patient cleans and packs his own wounds regularly. Ortho  recommended  more frequent packing dressing changes of left hip wound with follow-up  o/p. Will continue with aggressive wound care and IV antibiotics.  - Wound care on board - ID on board         Paraplegia (HCC)     Anemia of infection and chronic disease     Has received 3 units pRBCS during this admission. There is a questions  whether the decrease in Hgb is d/t dilution vs anemia 2/2 to sepsis.  Stable today Hg has been stable s/p RBC transfusion. Iron studies show  decreased iron and TIBC with normal ferritin, likely in the setting on  anemia of chronic disease.  - Based on his CBC, if Hgb < 7.5, will order more blood from [place] (2  units in house)  - Daily CBC        Chronic osteomyelitis of pelvic region [facility])     Cough     Patient has been complaining of some increased shortness of breath and  cough.  He has also been tachycardic to the 130s. Concern for PE, CT  angiogram was negative. - incentive spirometry - continue to monitor        Acute deep vein thrombosis (DVT) of axillary vein of right upper  extremity (HCC)     DVT noted on R upper extremity. Patient R arm is not swollen and  nontender. - holding lovenox 1mg /kg q12h in the setting of surgery, will restart if  surgery is postponed  - continue to monitor        Pressure injury of contiguous region involving left buttock and hip,  stage 4 (HCC)     Pressure injury, stage 4, with infection (HCC)    FEN/GI: NPO PPx: lovenox, holding for surgery today  Dispo:pending clinical improvement  Subjective:  Patient states he is doing okay today. He is feeling a bit nervous about surgery but has no other complaints at this time.   Objective: Temp:  [98.3 F (36.8 C)-99.6 F (37.6 C)] 98.3 F (36.8 C) (07/19 1250) Pulse Rate:  [111-114] 111 (07/19 1200) Resp:  [17-20] 20 (07/19 1200) BP: (94-123)/(64-84) 94/64 (07/19 1200) SpO2:  [93 %-97 %] 93 % (07/19 1200) Physical Exam: General: resting  comfortably in bed  Cardiovascular: tachycardia, no m/r/g Respiratory: NWOB on RA Abdomen: distended, soft  Extremities: limited ROM on R arm   Laboratory: Most recent CBC Lab Results  Component Value Date   WBC 9.2 08/07/2022   HGB 7.6 (L) 08/07/2022   HCT 26.5 (L) 08/07/2022   MCV 76.4 (L) 08/07/2022   PLT 591 (H) 08/07/2022   Most recent BMP    Latest Ref Rng & Units 08/07/2022    9:26 AM  BMP  Glucose 70 - 99 mg/dL 161   BUN 6 - 20 mg/dL 7   Creatinine 0.96 - 0.45 mg/dL 4.09   Sodium 811 - 914 mmol/L 135   Potassium 3.5 - 5.1 mmol/L 3.3   Chloride 98 - 111 mmol/L 105   CO2 22 - 32 mmol/L 25   Calcium 8.9 - 10.3 mg/dL 8.1     Imaging/Diagnostic Tests: No new imaging  Anthony Lash, MD 08/08/2022, 12:54 PM  PGY-1, [name] FPTS Intern pager: [phone], text pages welcome Secure chat group [facility] Teaching Service

## 2022-08-08 NOTE — Progress Notes (Signed)
Patient ID: Anthony [name], male   DOB: [birth year], 52 y.o.   MRN: [id number]  [facility] Surgery Progress Note  4 Days Post-Op  Subjective: CC-  No complaints. Hopeful to have surgery today.  Objective: Vital signs in last 24 hours: Temp:  [98.4 F (36.9 C)-99.6 F (37.6 C)] 99.6 F (37.6 C) (07/18 2332) Pulse Rate:  [103-114] 114 (07/18 2332) Resp:  [17-20] 18 (07/19 0800) BP: (106-123)/(56-84) 111/73 (07/19 0800) SpO2:  [96 %-99 %] 96 % (07/18 2332) Last BM Date : 08/08/22  Intake/Output from previous day: 07/18 0701 - 07/19 0700 In: 240 [P.O.:240] Out: 2600 [Urine:2600] Intake/Output this shift: No intake/output data recorded.  PE: Gen:  Alert, NAD, pleasant Abd: Soft, ND, NT. Ostomy sites marked.  I will defer looking at wounds today given these were just changed by staff  Lab Results:  Recent Labs    08/06/22 0450 08/07/22 0926  WBC 10.8* 9.2  HGB 7.3* 7.6*  HCT 25.4* 26.5*  PLT 502* 591*   BMET Recent Labs    08/07/22 0926  NA 135  K 3.3*  CL 105  CO2 25  GLUCOSE 123*  BUN 7  CREATININE 0.48*  CALCIUM 8.1*   PT/INR No results for input(s): "LABPROT", "INR" in the last 72 hours. CMP     Component Value Date/Time   NA 135 08/07/2022 0926   NA 141 03/26/2021 1356   K 3.3 (L) 08/07/2022 0926   CL 105 08/07/2022 0926   CO2 25 08/07/2022 0926   GLUCOSE 123 (H) 08/07/2022 0926   BUN 7 08/07/2022 0926   BUN 7 03/26/2021 1356   CREATININE 0.48 (L) 08/07/2022 0926   CREATININE 1.00 03/09/2014 1633   CALCIUM 8.1 (L) 08/07/2022 0926   PROT 5.3 (L) 07/26/2022 0720   PROT 8.0 03/26/2021 1356   ALBUMIN <1.5 (L) 07/26/2022 0720   ALBUMIN 4.3 03/26/2021 1356   AST 53 (H) 07/26/2022 0720   ALT 33 07/26/2022 0720   ALKPHOS 177 (H) 07/26/2022 0720   BILITOT 1.0 07/26/2022 0720   BILITOT 0.5 03/26/2021 1356   GFRNONAA >60 08/07/2022 0926   GFRAA >60 01/31/2017 0543   Lipase     Component Value Date/Time   LIPASE 29 11/22/2019 2059        Studies/Results: No results found.  Anti-infectives: Anti-infectives (From admission, onward)    Start     Dose/Rate Route Frequency Ordered Stop   08/05/22 1500  piperacillin-tazobactam (ZOSYN) IVPB 3.375 g  Status:  Discontinued        3.375 g 100 mL/hr over 30 Minutes Intravenous Every 8 hours 08/05/22 0943 08/05/22 0944   08/05/22 1500  piperacillin-tazobactam (ZOSYN) IVPB 3.375 g        3.375 g 12.5 mL/hr over 240 Minutes Intravenous Every 8 hours 08/05/22 0945     07/28/22 2200  metroNIDAZOLE (FLAGYL) tablet 500 mg  Status:  Discontinued        500 mg Oral Every 12 hours 07/28/22 1412 08/05/22 0943   07/26/22 1800  ceFEPIme (MAXIPIME) 2 g in sodium chloride 0.9 % 100 mL IVPB  Status:  Discontinued        2 g 200 mL/hr over 30 Minutes Intravenous Every 8 hours 07/26/22 1745 08/05/22 0943   07/25/22 2100  ceFEPIme (MAXIPIME) 2 g in sodium chloride 0.9 % 100 mL IVPB  Status:  Discontinued        2 g 200 mL/hr over 30 Minutes Intravenous Every 12 hours 07/25/22 0926 07/26/22 1745  07/25/22 2100  metroNIDAZOLE (FLAGYL) IVPB 500 mg  Status:  Discontinued        500 mg 100 mL/hr over 60 Minutes Intravenous Every 12 hours 07/25/22 1900 07/28/22 1412   07/25/22 1700  DAPTOmycin (CUBICIN) 650 mg in sodium chloride 0.9 % IVPB        8 mg/kg  79.4 kg 126 mL/hr over 30 Minutes Intravenous Daily 07/25/22 1546     07/25/22 0800  ceFEPIme (MAXIPIME) 2 g in sodium chloride 0.9 % 100 mL IVPB        2 g 200 mL/hr over 30 Minutes Intravenous  Once 07/25/22 0758 07/25/22 0942   07/25/22 0800  metroNIDAZOLE (FLAGYL) IVPB 500 mg        500 mg 100 mL/hr over 60 Minutes Intravenous  Once 07/25/22 0758 07/25/22 1159        Assessment/Plan Spina bifida with paraplegia and chronic sacral ulcers and osteomyelitis  Consult for fecal diversion  - Defer tx of Osteo + bacteremia to ID and possible septic joints to Ortho. He has been seen by Urology for perineal wound. Agree that this does  not look like it needs urgent/emergent surgery/debridement at this time. They are planning follow up with Urology/plastic in the future. I did not see an obvious fistula on exam. Wound care as outlined by WOCN.  - Hopeful for surgery today but discussed with patient that his case may be delayed due to emergencies/ OR availability. Keep NPO for now in case he goes to OR. If he gets delayed this will likely not happen over the weekend, and surgery will be next week.   FEN - NPO at midnight.  VTE - SCDs, Lovenox held for possible OR. Primary team reports they are having PRBC ready ahead for OR incase as patients blood has to come from [facility].  ID - On Daptomycin and Zosyn. This should be adequate peri-op coverage Foley - SP tube (chronic)   I reviewed nursing notes, last 24 h vitals and pain scores, last 48 h intake and output, last 24 h labs and trends, and last 24 h imaging results.     LOS: 13 days    Franne Forts, [facility] Surgery 08/08/2022, 10:39 AM Please see Amion for pager number during day hours 7:00am-4:30pm

## 2022-08-08 NOTE — Progress Notes (Signed)
*** Date/Time ***  Daily Progress Note Intern Pager: [phone]  Patient name: Anthony [name] Medical record number: [id number] Date of birth: [birth year] Age: 52 y.o. Gender: male  Primary Care Provider: Erick Alley, DO Consultants: ID, Cardiology, Dr. [name] (ortho), General Surgery  Code Status: Full Code  Pt Overview and Major Events to Date:  7/5: Admitted 7/6: Ortho consulted, stated no acute intervention indicated. ID consulted, recommended BSA bx 7/7: Hg dropped and patient received 1x pRBCs  7/8: Hg dropped again, received another unit of pRBC, [name] ordered per ID recs  7/9: replaced catheter 7/12: received 1x pRBC 7/15: [name] 7/19: Surgery cancelled due to IT issues/outage  Assessment and Plan: Anthony [name] is a 52 yo M with PMHx of spina bfida with paraplegia and chronic sacral ulcers admitted for septic shock 2/2 to infected sacral ulcers and osteomyelitis as supported by CT and MRI.   [facility]     * (Principal) Polymicrobial bacterial infection     Polymicrobial bacterial infection of both gram-positive and negative  bacteria resulting from chronic decubitus ulcers due to paraplegia from  lumbar spina bifida. Patient has maintained blood pressures. [name] 7/15  showed no vegetations. Suprapubic catheter exchanged 7/10. Dr. [name] 7/15  thinks there is stool in his wounds and recommended general surgery  evaluate patient for diverting ostomy. General surgery has recommended  diverting colostomy. IV Cefepime (7/5 - 7/16) & Flagyl (7/5 -7/16 )  completed.  - Diverting colostomy rescheduled for next week per gen surg - Daptomycin (7/5 - ) - Zosyn (7/18 - )  - BCID: strep spp + proteus, no growth over 5 days  - Ibuprofen PRN for fevers        Acute Right shoulder pain     XR negative for fracture. - PT/OT following - multimodal pain management - Roxabin, Voltaren, topical Lidocaine and  PRN Tylenol         Decubitus ulcer due to spina bifida [facility])      Patient cleans and packs his own wounds regularly. Ortho recommended  more frequent packing dressing changes of left hip wound with follow-up  o/p. Will continue with aggressive wound care and IV antibiotics.  - Wound care on board - ID on board         Paraplegia (HCC)     Anemia of infection and chronic disease     Has received 3 units pRBCS during this admission. There is a questions  whether the decrease in Hgb is d/t dilution vs anemia 2/2 to sepsis.  Stable today Hg has remained stable. Iron studies show decreased iron and  TIBC with normal ferritin, likely in the setting on anemia of chronic  disease.  - Based on his CBC, if Hgb < 7.5, will order more blood from [place] (2  units in house)  - Daily CBC        Chronic osteomyelitis of pelvic region [facility])     Cough     Patient has been complaining of some increased shortness of breath and  cough.  He has also been tachycardic to the 130s. Concern for PE, CT  angiogram was negative. - Continue incentive spirometry - Continue to monitor        Acute deep vein thrombosis (DVT) of axillary vein of right upper  extremity (HCC)     DVT noted on R upper extremity. Patient R arm is not swollen and  nontender. - Restarted Lovenox 1mg /kg q12h due  to surgery being postponed - Continue to monitor      Chronic Stable Conditions Pressure ulcers - Wound care on board  FEN/GI: Regular Diet PPx: Lovenox Dispo: Pending clinical improvement  Subjective:  Patient fevered overnight, but has no major complaints this morning ***. Notified patient that his surgery will most likely be next week due to the issues with scheduling/availability s/p IT outage.   Objective: Temp:  [98.3 F (36.8 C)-100.4 F (38 C)] 100 F (37.8 C) (07/19 2300) Pulse Rate:  [111-117] 115 (07/19 2300) Resp:  [18-20] 20 (07/19 2300) BP: (94-130)/(61-84) 99/61 (07/19 2300) SpO2:  [93 %-98 %] 95 % (07/19 2300) Physical Exam: General: resting, comfortably  in bed Cardiovascular: Tachycardic. No M/R/G Respiratory: CTAB. Normal WOB on RA. No wheezing, crackles or rhonchi. Abdomen: soft, non-tender, distended?? *** Extremities: limited ROM of R arm  Laboratory: Most recent CBC Lab Results  Component Value Date   WBC 14.5 (H) 08/08/2022   HGB 8.5 (L) 08/08/2022   HCT 29.8 (L) 08/08/2022   MCV 78.2 (L) 08/08/2022   PLT 758 (H) 08/08/2022   Most recent BMP    Latest Ref Rng & Units 08/08/2022    1:50 PM  BMP  Glucose 70 - 99 mg/dL 509   BUN 6 - 20 mg/dL 9   Creatinine 3.26 - 7.12 mg/dL 4.58   Sodium 099 - 833 mmol/L 136   Potassium 3.5 - 5.1 mmol/L 3.9   Chloride 98 - 111 mmol/L 103   CO2 22 - 32 mmol/L 24   Calcium 8.9 - 10.3 mg/dL 8.5    CK: 7  Imaging/Diagnostic Tests: No new imaging.  [name], DO 08/08/2022, 11:24 PM PGY-1, [facility]  FPTS Intern pager: [phone], text pages welcome Secure chat group [facility] Advanced Pain Management Teaching Service

## 2022-08-09 DIAGNOSIS — A499 Bacterial infection, unspecified: Secondary | ICD-10-CM | POA: Diagnosis not present

## 2022-08-09 LAB — BASIC METABOLIC PANEL
Anion gap: 9 (ref 5–15)
BUN: 13 mg/dL (ref 6–20)
CO2: 25 mmol/L (ref 22–32)
Calcium: 8.3 mg/dL — ABNORMAL LOW (ref 8.9–10.3)
Chloride: 102 mmol/L (ref 98–111)
Creatinine, Ser: 0.35 mg/dL — ABNORMAL LOW (ref 0.61–1.24)
GFR, Estimated: >60 mL/min (ref >60)
Glucose, Bld: 118 mg/dL — ABNORMAL HIGH (ref 70–99)
Potassium: 3.9 mmol/L (ref 3.5–5.1)
Sodium: 136 mmol/L (ref 135–145)

## 2022-08-09 LAB — CBC
HCT: 26.3 % — ABNORMAL LOW (ref 39.0–52.0)
Hemoglobin: 7.5 g/dL — ABNORMAL LOW (ref 13.0–17.0)
MCH: 22.1 pg — ABNORMAL LOW (ref 26.0–34.0)
MCHC: 28.5 g/dL — ABNORMAL LOW (ref 30.0–36.0)
MCV: 77.6 fL — ABNORMAL LOW (ref 80.0–100.0)
Platelets: 712 10*3/uL — ABNORMAL HIGH (ref 150–400)
RBC: 3.39 MIL/uL — ABNORMAL LOW (ref 4.22–5.81)
RDW: 24.1 % — ABNORMAL HIGH (ref 11.5–15.5)
WBC: 12.2 10*3/uL — ABNORMAL HIGH (ref 4.0–10.5)
nRBC: 0 % (ref 0.0–0.2)

## 2022-08-10 DIAGNOSIS — A499 Bacterial infection, unspecified: Secondary | ICD-10-CM | POA: Diagnosis not present

## 2022-08-10 LAB — COMPREHENSIVE METABOLIC PANEL
ALT: 23 U/L (ref 0–44)
AST: 30 U/L (ref 15–41)
Albumin: 1.5 g/dL — ABNORMAL LOW (ref 3.5–5.0)
Alkaline Phosphatase: 232 U/L — ABNORMAL HIGH (ref 38–126)
Anion gap: 8 (ref 5–15)
BUN: 11 mg/dL (ref 6–20)
CO2: 25 mmol/L (ref 22–32)
Calcium: 8.4 mg/dL — ABNORMAL LOW (ref 8.9–10.3)
Chloride: 102 mmol/L (ref 98–111)
Creatinine, Ser: 0.45 mg/dL — ABNORMAL LOW (ref 0.61–1.24)
GFR, Estimated: >60 mL/min (ref >60)
Glucose, Bld: 126 mg/dL — ABNORMAL HIGH (ref 70–99)
Potassium: 3.4 mmol/L — ABNORMAL LOW (ref 3.5–5.1)
Sodium: 135 mmol/L (ref 135–145)
Total Bilirubin: <0.1 mg/dL — ABNORMAL LOW (ref 0.3–1.2)
Total Protein: 7.5 g/dL (ref 6.5–8.1)

## 2022-08-10 LAB — CBC
HCT: 27.5 % — ABNORMAL LOW (ref 39.0–52.0)
Hemoglobin: 7.6 g/dL — ABNORMAL LOW (ref 13.0–17.0)
MCH: 21.3 pg — ABNORMAL LOW (ref 26.0–34.0)
MCHC: 27.6 g/dL — ABNORMAL LOW (ref 30.0–36.0)
MCV: 77 fL — ABNORMAL LOW (ref 80.0–100.0)
Platelets: 734 10*3/uL — ABNORMAL HIGH (ref 150–400)
RBC: 3.57 MIL/uL — ABNORMAL LOW (ref 4.22–5.81)
RDW: 23.9 % — ABNORMAL HIGH (ref 11.5–15.5)
WBC: 10.9 10*3/uL — ABNORMAL HIGH (ref 4.0–10.5)
nRBC: 0 % (ref 0.0–0.2)

## 2022-08-10 MED ORDER — HYDROXYZINE HCL 25 MG PO TABS
25.0000 mg | ORAL_TABLET | Freq: Two times a day (BID) | ORAL | Status: DC | PRN
  Administered 2022-08-10 – 2022-09-01 (×25): 25 mg via ORAL
  Filled 2022-08-10 (×26): qty 1

## 2022-08-10 MED ORDER — POTASSIUM CHLORIDE CRYS ER 20 MEQ PO TBCR
40.0000 meq | EXTENDED_RELEASE_TABLET | Freq: Once | ORAL | Status: AC
  Administered 2022-08-10: 40 meq via ORAL
  Filled 2022-08-10: qty 2

## 2022-08-10 NOTE — Progress Notes (Addendum)
Daily Progress Note Intern Pager: [phone]  Patient name: Anthony [name] Medical record number: [id number] Date of birth: 1971/01/05 Age: 52 y.o. Gender: male  Primary Care Provider: Erick Alley, DO Consultants: ID, Cardiology, Dr. [name] (ortho), General Surgery  Code Status: Full Code   Pt Overview and Major Events to Date:  7/5: Admitted 7/6: Ortho consulted, stated no acute intervention indicated. ID consulted, recommended BSA bx 7/7: Hg dropped and patient received 1x pRBCs  7/8: Hg dropped again, 1x pRBCs, [name] ordered per ID recs  7/9: replaced catheter 7/12: received 1x pRBC 7/15: [name] - LVEF 55-60%, normal function 7/19: Surgery cancelled due to IT issues/outage   Assessment and Plan: Anthony [name] is a 52 yo M with PMHx of spina bfida with paraplegia and chronic sacral ulcers admitted for septic shock 2/2 to infected sacral ulcers and osteomyelitis as supported by CT and MRI.   Pt is stable at this time.  [facility]     * (Principal) Polymicrobial bacterial infection     Polymicrobial bacterial infection - Dr. [name] 7/15 thinks there is stool  in his wounds. General surgery has recommended diverting colostomy. IV  Cefepime (7/5 - 7/16) & Flagyl (7/5 -7/16 ) completed.  - Diverting colostomy rescheduled for next week per gen surg - Daptomycin (7/5 - ) - Zosyn (7/18 - )  - BCID: strep spp + proteus, no growth over 5 days  - Ibuprofen PRN for fevers        Acute Right shoulder pain     XR negative for fracture. - PT/OT following - Multimodal pain management - Roxabin, Voltaren, Lidoderm and PRN Tylenol         Decubitus ulcer due to spina bifida (HCC)     Will continue with aggressive wound care and IV antibiotics. Possible  surgery this week. - appreciate surgery recs - Wound care on board - ID on board         Anemia of infection and chronic disease     Has received 3 units pRBCS during this admission. Stable today. Hgb 7.6  this AM. - Based  on his CBC, if Hgb < 7.5, will order more blood from [place] (2  units in house)  - Daily CBC         Acute deep vein thrombosis (DVT) of axillary vein of right upper  extremity (HCC)     DVT noted on R upper extremity. Patient R arm is not swollen and  nontender. - Restarted Lovenox 1mg /kg q12h due to surgery being postponed - Continue to monitor      Chronic, Stable Conditions Pressure ulcers - Wound care on board Paraplegia Chronic osteomyelitis of Pelvic Region Cough - Continue incentive spirometry Anxiety - 7/21: increase hydroxyzine to 25mg    FEN/GI: Regular Diet PPx: Lovenox Dispo: Pending clinical improvement  Subjective:  Patient seen lying comfortably in bed this morning watching TV.  He denies headache, chest pain, abdominal pain.  He reports his right arm is somewhat stiff, but denies frank pain.  He does question possible surgical plans upcoming this week.  No other concerns at this time.  Objective: Temp:  [97.8 F (36.6 C)-101.2 F (38.4 C)] 97.8 F (36.6 C) (07/21 0421) Pulse Rate:  [95-120] 95 (07/21 0421) Resp:  [16-20] 18 (07/21 0421) BP: (107-117)/(60-78) 117/78 (07/21 0421) SpO2:  [95 %-100 %] 99 % (07/21 0421) Weight:  [77.6 kg] 77.6 kg (07/21 0421) Physical Exam: General: Pleasant,  no apparent distress Cardiovascular: RRR, no murmurs Respiratory: CTA bilaterally, no increased work of breathing.  On room air. Abdomen: Normoactive bowel sounds.  Soft, nontender Extremities: Limited range of motion of right arm, with some pain Neuro: Alert and oriented Psych: Pleasant, conversational  Laboratory: Most recent CBC Lab Results  Component Value Date   WBC 10.9 (H) 08/10/2022   HGB 7.6 (L) 08/10/2022   HCT 27.5 (L) 08/10/2022   MCV 77.0 (L) 08/10/2022   PLT 734 (H) 08/10/2022   Most recent BMP    Latest Ref Rng & Units 08/09/2022    5:50 AM  BMP  Glucose 70 - 99 mg/dL 914   BUN 6 - 20 mg/dL 13   Creatinine 7.82 - 1.24 mg/dL 9.56    Sodium 213 - 086 mmol/L 136   Potassium 3.5 - 5.1 mmol/L 3.9   Chloride 98 - 111 mmol/L 102   CO2 22 - 32 mmol/L 25   Calcium 8.9 - 10.3 mg/dL 8.3     Cyndia Skeeters, DO 08/10/2022, 9:24 AM  PGY-1, [facility] FPTS Intern pager: [phone], text pages welcome Secure chat group [facility] Teaching Service

## 2022-08-10 NOTE — Assessment & Plan Note (Signed)
Will continue with aggressive wound care and IV antibiotics. Possible surgery this week. - appreciate surgery recs - Wound care on board - ID on board

## 2022-08-10 NOTE — Assessment & Plan Note (Signed)
Has received 3 units pRBCS during this admission. Stable today. Hgb 7.6 this AM. - Based on his CBC, if Hgb < 7.5, will order more blood from [place] (2 units in house)  - Daily CBC

## 2022-08-10 NOTE — Assessment & Plan Note (Signed)
XR negative for fracture. - PT/OT following - Multimodal pain management - Roxabin, Voltaren, Lidoderm and PRN Tylenol

## 2022-08-10 NOTE — Assessment & Plan Note (Signed)
DVT noted on R upper extremity. Patient R arm is not swollen and nontender. - Restarted Lovenox 1mg /kg q12h due to surgery being postponed - Continue to monitor

## 2022-08-10 NOTE — Assessment & Plan Note (Signed)
Polymicrobial bacterial infection - Dr. [name] 7/15 thinks there is stool in his wounds. General surgery has recommended diverting colostomy. IV Cefepime (7/5 - 7/16) & Flagyl (7/5 -7/16 ) completed.  - Diverting colostomy rescheduled for next week per gen surg - Daptomycin (7/5 - ) - Zosyn (7/18 - )  - BCID: strep spp + proteus, no growth over 5 days  - Ibuprofen PRN for fevers

## 2022-08-11 DIAGNOSIS — A499 Bacterial infection, unspecified: Secondary | ICD-10-CM | POA: Diagnosis not present

## 2022-08-11 LAB — CBC
HCT: 27.2 % — ABNORMAL LOW (ref 39.0–52.0)
Hemoglobin: 7.7 g/dL — ABNORMAL LOW (ref 13.0–17.0)
MCH: 21.7 pg — ABNORMAL LOW (ref 26.0–34.0)
MCHC: 28.3 g/dL — ABNORMAL LOW (ref 30.0–36.0)
MCV: 76.6 fL — ABNORMAL LOW (ref 80.0–100.0)
Platelets: 801 10*3/uL — ABNORMAL HIGH (ref 150–400)
RBC: 3.55 MIL/uL — ABNORMAL LOW (ref 4.22–5.81)
RDW: 23.6 % — ABNORMAL HIGH (ref 11.5–15.5)
WBC: 11.1 10*3/uL — ABNORMAL HIGH (ref 4.0–10.5)
nRBC: 0 % (ref 0.0–0.2)

## 2022-08-11 LAB — COMPREHENSIVE METABOLIC PANEL
ALT: 24 U/L (ref 0–44)
AST: 27 U/L (ref 15–41)
Albumin: 1.5 g/dL — ABNORMAL LOW (ref 3.5–5.0)
Alkaline Phosphatase: 228 U/L — ABNORMAL HIGH (ref 38–126)
Anion gap: 9 (ref 5–15)
BUN: 9 mg/dL (ref 6–20)
CO2: 26 mmol/L (ref 22–32)
Calcium: 8.8 mg/dL — ABNORMAL LOW (ref 8.9–10.3)
Chloride: 101 mmol/L (ref 98–111)
Creatinine, Ser: 0.49 mg/dL — ABNORMAL LOW (ref 0.61–1.24)
GFR, Estimated: >60 mL/min (ref >60)
Glucose, Bld: 110 mg/dL — ABNORMAL HIGH (ref 70–99)
Potassium: 3.8 mmol/L (ref 3.5–5.1)
Sodium: 136 mmol/L (ref 135–145)
Total Bilirubin: 0.3 mg/dL (ref 0.3–1.2)
Total Protein: 7.5 g/dL (ref 6.5–8.1)

## 2022-08-11 LAB — TYPE AND SCREEN
Antibody Screen: POSITIVE
DAT, IgG: POSITIVE
DAT, IgG: POSITIVE
Donor AG Type: NEGATIVE
Donor AG Type: NEGATIVE
Donor AG Type: NEGATIVE
Unit division: 0
Unit division: 0

## 2022-08-11 LAB — BPAM RBC
Blood Product Expiration Date: 202408122359
Unit Type and Rh: 5100
Unit Type and Rh: 5100
Unit Type and Rh: 5100

## 2022-08-11 MED ORDER — FAMOTIDINE 20 MG PO TABS
10.0000 mg | ORAL_TABLET | Freq: Once | ORAL | Status: AC | PRN
  Administered 2022-08-11: 10 mg via ORAL
  Filled 2022-08-11: qty 1

## 2022-08-11 MED ORDER — ENOXAPARIN SODIUM 80 MG/0.8ML IJ SOSY
1.0000 mg/kg | PREFILLED_SYRINGE | Freq: Two times a day (BID) | INTRAMUSCULAR | Status: AC
  Administered 2022-08-11: 80 mg via SUBCUTANEOUS
  Filled 2022-08-11: qty 0.8

## 2022-08-11 MED ORDER — ONDANSETRON HCL 4 MG PO TABS
4.0000 mg | ORAL_TABLET | Freq: Once | ORAL | Status: AC
  Administered 2022-08-11: 4 mg via ORAL
  Filled 2022-08-11: qty 1

## 2022-08-11 MED ORDER — SODIUM CHLORIDE 0.9 % IV BOLUS
500.0000 mL | Freq: Once | INTRAVENOUS | Status: AC
  Administered 2022-08-11: 500 mL via INTRAVENOUS

## 2022-08-11 NOTE — Progress Notes (Signed)
Daily Progress Note Intern Pager: [phone]  Patient name: Anthony [name] Medical record number: [id number] Date of birth: 1970-03-26 Age: 52 y.o. Gender: male  Primary Care Provider: Erick Alley, DO Consultants:  ID, Cardiology, Ortho, General Surgery  Code Status: Full Code  Pt Overview and Major Events to Date:  7/5: Admitted 7/6: Ortho consulted, stated no acute intervention indicated. ID consulted, recommended BSA bx 7/7: Hg dropped and patient received 1x pRBCs  7/8: Hg dropped again, 1x pRBCs, [name] ordered per ID recs  7/9: replaced catheter 7/12: received 1x pRBC 7/15: [name] - LVEF 55-60%, normal function 7/19: Surgery cancelled due to IT issues/outage  Assessment and Plan: Anthony [name] is a 52 yo M with PMHx of spina bfida with paraplegia and chronic sacral ulcers admitted for septic shock 2/2 to infected sacral ulcers and osteomyelitis as supported by CT and MRI.   [facility]     * (Principal) Polymicrobial bacterial infection     Polymicrobial bacterial infection - Dr. [name] 7/15 thinks there is stool  in his wounds. General surgery has recommended diverting colostomy. IV  Cefepime (7/5 - 7/16) & Flagyl (7/5 -7/16 ) completed.  - Diverting colostomy possibly tomorrow - Daptomycin (7/5 - ) - Zosyn (7/18 - )  - ID recommended switching to PO abx tomorrow s/p diverting colostomy  likely with Augmentin/Doxycycline, will reassess after surgery per ID - Consider hydrotherapy for debridement purposes s/p diverting colostomy  per ID - Lovenox held after PM dose dt/ plan for surgery tomorrow - BCID: strep spp + proteus, no growth over 5 days  - Ibuprofen PRN for fevers        Acute Right shoulder pain     XR negative for fracture. - PT/OT following - Multimodal pain management - Roxabin, Voltaren, Lidoderm and PRN Tylenol         Decubitus ulcer due to spina bifida (HCC)     Will continue with aggressive wound care and IV antibiotics and  transition to  PO possibly after surgery tomorrow - Appreciate surgery recs - Wound care on board - ID on board         Anemia of infection and chronic disease     Has received 3 units pRBCS during this admission. Stable today. Hgb 7.7  this AM. - Requires blood from [place] (2 units in house) - Daily CBC         Acute deep vein thrombosis (DVT) of axillary vein of right upper  extremity (HCC)     DVT noted on R upper extremity. Patient R arm is not swollen and  nontender. - D/c Lovenox after PM dose for surgery tomorrow - Continue to monitor       FEN/GI: Regular Diet PPx: Lovenox Dispo: Pending clinical improvement  Subjective:  Patient seen sleep this morning but awake and and lying lying comfortably in bed this morning.  He has no complaints this morning.  He reports his right arm is somewhat stiff, but denies pain.  He feels like he is getting better sleep but still not sleeping great.  Objective: Temp:  [97.7 F (36.5 C)-102.6 F (39.2 C)] 98.2 F (36.8 C) (07/22 1147) Pulse Rate:  [92-124] 98 (07/22 1147) Resp:  [18-20] 20 (07/22 1147) BP: (95-123)/(60-66) 102/66 (07/22 1147) SpO2:  [96 %-98 %] 96 % (07/22 1147)  Physical Exam: General: Pleasant, awake and alert, NAD Cardiovascular: RRR. No M/R/G Respiratory: CTAB.  Normal work of breathing  on RA Abdomen: Soft, non-tender, non-distended. Normoactive bowel sounds. Extremities: Limited range of motion of right arm, with some pain Neuro: AAOx3  Laboratory: Most recent CBC Lab Results  Component Value Date   WBC 11.1 (H) 08/11/2022   HGB 7.7 (L) 08/11/2022   HCT 27.2 (L) 08/11/2022   MCV 76.6 (L) 08/11/2022   PLT 801 (H) 08/11/2022   Most recent BMP    Latest Ref Rng & Units 08/11/2022    2:03 AM  BMP  Glucose 70 - 99 mg/dL 409   BUN 6 - 20 mg/dL 9   Creatinine 8.11 - 9.14 mg/dL 7.82   Sodium 956 - 213 mmol/L 136   Potassium 3.5 - 5.1 mmol/L 3.8   Chloride 98 - 111 mmol/L 101   CO2 22 - 32 mmol/L 26    Calcium 8.9 - 10.3 mg/dL 8.8     Imaging/Diagnostic Tests: No new imaging.  [name], DO 08/11/2022, 1:55 PM PGY-53, [facility]  FPTS Intern pager: [phone], text pages welcome Secure chat group [facility] Teaching Service

## 2022-08-11 NOTE — Plan of Care (Signed)

## 2022-08-11 NOTE — Assessment & Plan Note (Signed)
XR negative for fracture. - PT/OT following - Multimodal pain management - Roxabin, Voltaren, Lidoderm and PRN Tylenol

## 2022-08-11 NOTE — Assessment & Plan Note (Addendum)
Has received 3 units pRBCS during this admission. Stable today. Hgb 7.7 this AM. - Requires blood from [place] (2 units in house) - Daily CBC

## 2022-08-11 NOTE — Assessment & Plan Note (Signed)
DVT noted on R upper extremity. Patient R arm is not swollen and nontender. - D/c Lovenox after PM dose for surgery tomorrow - Continue to monitor

## 2022-08-11 NOTE — Progress Notes (Signed)
Physical Therapy Treatment Patient Details Name: Anthony [name] DOB: 29-Jun-1970 Today's Date: 08/11/2022   History of Present Illness Pt is a 52 y.o. male who presented 07/25/22 with weakness and a fall. Pt admitted with severe sepsis secondary to sacral decubitus ulcer infection with concern for hip septic arthritis noted on CT. Imaging of R shoulder showed moderate acromioclavicular OA & mild distal lasteral subacromial spurring. PMH: complete paraplegia, multiple toe amputations, chronic sacral wounds, neurogenic bladder with suprapubic catheter, spina bifida, HTN    PT Comments  Pt received in supine and agreeable to session. Pt continues to require increased assist with bed mobility and transfers due to R shoulder pain. Pt demonstrates improved transfer to the R due to pain. Pt using the sliding board this session due to difficulty last session scooting over bedrail. Pt reports improvement with use of sliding board, however continues to demonstrate increased difficulty transferring back to bed due to the higher surface. Pt able to increase w/c mobility distance this session with use of BUE for propelling. Pt's bandage noted to be coming loose on one side and increased drainage present, RN notified and present to assess at end of session. Pt continues to benefit from PT services to progress toward functional mobility goals.      Assistance Recommended at Discharge Intermittent Supervision/Assistance  If plan is discharge home, recommend the following:  Can travel by private vehicle    Two people to help with walking and/or transfers;A lot of help with bathing/dressing/bathroom;Assistance with cooking/housework;Assist for transportation;Help with stairs or ramp for entrance      Equipment Recommendations  Hospital bed    Recommendations for Other Services       Precautions / Restrictions Precautions Precautions: Fall;Other (comment) Precaution Comments: paraplegia Restrictions Weight  Bearing Restrictions: Yes RLE Weight Bearing: Non weight bearing LLE Weight Bearing: Non weight bearing     Mobility  Bed Mobility Overal bed mobility: Needs Assistance Bed Mobility: Rolling, Supine to Sit, Sit to Supine Rolling: Min guard   Supine to sit: Mod assist Sit to supine: Mod assist   General bed mobility comments: Assist for trunk elevation and BLE management    Transfers Overall transfer level: Needs assistance Equipment used: Sliding board Transfers: Bed to chair/wheelchair/BSC             General transfer comment: Min A to the w/c and mod A back to bed due to higher surface. Use of sliding board with cues for set up and pt stating that it was easier with the board.     Merchant navy officer mobility: Yes Wheelchair propulsion: Both upper extremities Wheelchair parts: Independent Distance: 390 Wheelchair Assistance Details (indicate cue type and reason): Pt able to propel with BUE, but pt reporting some increased pain in RUE. Assist for line management.       Balance Overall balance assessment: Needs assistance Sitting-balance support: Feet unsupported, Bilateral upper extremity supported Sitting balance-Leahy Scale: Fair Sitting balance - Comments: sitting EOB                                    Cognition Arousal/Alertness: Awake/alert Behavior During Therapy: WFL for tasks assessed/performed Overall Cognitive Status: Within Functional Limits for tasks assessed  Exercises General Exercises - Upper Extremity Shoulder Flexion: AAROM, Right, 5 reps, Supine    General Comments        Pertinent Vitals/Pain Pain Assessment Pain Assessment: Faces Faces Pain Scale: Hurts little more Pain Location: R shoulder with mobility Pain Descriptors / Indicators: Grimacing, Aching, Guarding, Sore Pain Intervention(s): Monitored during session, Limited  activity within patient's tolerance     PT Goals (current goals can now be found in the care plan section) Acute Rehab PT Goals Patient Stated Goal: to improve and go home PT Goal Formulation: With patient Time For Goal Achievement: 08/09/22 Potential to Achieve Goals: Good Progress towards PT goals: Progressing toward goals    Frequency    Min 1X/week      PT Plan Current plan remains appropriate       AM-PAC PT "6 Clicks" Mobility   Outcome Measure  Help needed turning from your back to your side while in a flat bed without using bedrails?: A Little Help needed moving from lying on your back to sitting on the side of a flat bed without using bedrails?: A Lot Help needed moving to and from a bed to a chair (including a wheelchair)?: A Lot Help needed standing up from a chair using your arms (e.g., wheelchair or bedside chair)?: Total Help needed to walk in hospital room?: Total Help needed climbing 3-5 steps with a railing? : Total 6 Click Score: 10    End of Session   Activity Tolerance: Patient tolerated treatment well Patient left: in bed;with call bell/phone within reach;with nursing/sitter in room Nurse Communication: Mobility status (BM, wound drainage) PT Visit Diagnosis: Muscle weakness (generalized) (M62.81);History of falling (Z91.81);Pain Pain - Right/Left: Right Pain - part of body: Shoulder     Time: 5956-3875 PT Time Calculation (min) (ACUTE ONLY): 45 min  Charges:    $Therapeutic Activity: 23-37 mins $Wheel Chair Management: 8-22 mins PT General Charges $$ ACUTE PT VISIT: 1 Visit                     Anthony [name], PTA Acute Rehabilitation Services Secure Chat Preferred  [phone]    Anthony [name] 08/11/2022, 1:00 PM

## 2022-08-11 NOTE — Assessment & Plan Note (Addendum)
Will continue with aggressive wound care and IV antibiotics and transition to PO possibly after surgery tomorrow - Appreciate surgery recs - Wound care on board - ID on board

## 2022-08-11 NOTE — Progress Notes (Signed)
Id brief note   Awaiting diverting colostomy From note review appeared ortho potentially could do I&D after diverting colostomy  [name] negative for IE  Still febrile intermittently despite bs abx but no hemodynamic instability    A/p Chronic progress sacral and bilateral acetabular infectious process Polymicrobial bacteremia negative tee    Suspect high necrotic burden >> resistant bacteria in wound not covered with abx causing fever   Agree with diverting colostomy  Agree probable benefit from sacral process I&D, but if not available, then hydrotherapy could be potentially helpful    Once surgery done could switch to po abx. I do not see benefit of iv abx over PO at this time but due to ongoing surgical plan keeping on IV will reduce need to revert back and forth between iv/po  Discussed with primary team

## 2022-08-11 NOTE — Progress Notes (Signed)
7 Days Post-Op  Subjective: CC: Fever overnight.  No abdominal pain, n/v. BM yesterday.   Objective: Vital signs in last 24 hours: Temp:  [97.7 F (36.5 C)-102.6 F (39.2 C)] 97.7 F (36.5 C) (07/22 0812) Pulse Rate:  [92-124] 96 (07/22 0812) Resp:  [18-24] 20 (07/22 0812) BP: (95-123)/(60-72) 102/65 (07/22 0812) SpO2:  [96 %-98 %] 97 % (07/22 0812) Last BM Date : 08/09/22  Intake/Output from previous day: 07/21 0701 - 07/22 0700 In: 503 [P.O.:240; IV Piggyback:263] Out: 2200 [Urine:2200] Intake/Output this shift: No intake/output data recorded.  PE Gen:  Alert, NAD, pleasant Abd: Soft, ND, NT. Ostomy sites marked.   Lab Results:  Recent Labs    08/10/22 0830 08/11/22 0203  WBC 10.9* 11.1*  HGB 7.6* 7.7*  HCT 27.5* 27.2*  PLT 734* 801*   BMET Recent Labs    08/10/22 0830 08/11/22 0203  NA 135 136  K 3.4* 3.8  CL 102 101  CO2 25 26  GLUCOSE 126* 110*  BUN 11 9  CREATININE 0.45* 0.49*  CALCIUM 8.4* 8.8*   PT/INR No results for input(s): "LABPROT", "INR" in the last 72 hours. CMP     Component Value Date/Time   NA 136 08/11/2022 0203   NA 141 03/26/2021 1356   K 3.8 08/11/2022 0203   CL 101 08/11/2022 0203   CO2 26 08/11/2022 0203   GLUCOSE 110 (H) 08/11/2022 0203   BUN 9 08/11/2022 0203   BUN 7 03/26/2021 1356   CREATININE 0.49 (L) 08/11/2022 0203   CREATININE 1.00 03/09/2014 1633   CALCIUM 8.8 (L) 08/11/2022 0203   PROT 7.5 08/11/2022 0203   PROT 8.0 03/26/2021 1356   ALBUMIN 1.5 (L) 08/11/2022 0203   ALBUMIN 4.3 03/26/2021 1356   AST 27 08/11/2022 0203   ALT 24 08/11/2022 0203   ALKPHOS 228 (H) 08/11/2022 0203   BILITOT 0.3 08/11/2022 0203   BILITOT 0.5 03/26/2021 1356   GFRNONAA >60 08/11/2022 0203   GFRAA >60 01/31/2017 0543   Lipase     Component Value Date/Time   LIPASE 29 11/22/2019 2059    Studies/Results: No results found.  Anti-infectives: Anti-infectives (From admission, onward)    Start     Dose/Rate Route  Frequency Ordered Stop   08/05/22 1500  piperacillin-tazobactam (ZOSYN) IVPB 3.375 g  Status:  Discontinued        3.375 g 100 mL/hr over 30 Minutes Intravenous Every 8 hours 08/05/22 0943 08/05/22 0944   08/05/22 1500  piperacillin-tazobactam (ZOSYN) IVPB 3.375 g        3.375 g 12.5 mL/hr over 240 Minutes Intravenous Every 8 hours 08/05/22 0945     07/28/22 2200  metroNIDAZOLE (FLAGYL) tablet 500 mg  Status:  Discontinued        500 mg Oral Every 12 hours 07/28/22 1412 08/05/22 0943   07/26/22 1800  ceFEPIme (MAXIPIME) 2 g in sodium chloride 0.9 % 100 mL IVPB  Status:  Discontinued        2 g 200 mL/hr over 30 Minutes Intravenous Every 8 hours 07/26/22 1745 08/05/22 0943   07/25/22 2100  ceFEPIme (MAXIPIME) 2 g in sodium chloride 0.9 % 100 mL IVPB  Status:  Discontinued        2 g 200 mL/hr over 30 Minutes Intravenous Every 12 hours 07/25/22 0926 07/26/22 1745   07/25/22 2100  metroNIDAZOLE (FLAGYL) IVPB 500 mg  Status:  Discontinued        500 mg 100 mL/hr over 60  Minutes Intravenous Every 12 hours 07/25/22 1900 07/28/22 1412   07/25/22 1700  DAPTOmycin (CUBICIN) 650 mg in sodium chloride 0.9 % IVPB        8 mg/kg  79.4 kg 126 mL/hr over 30 Minutes Intravenous Daily 07/25/22 1546     07/25/22 0800  ceFEPIme (MAXIPIME) 2 g in sodium chloride 0.9 % 100 mL IVPB        2 g 200 mL/hr over 30 Minutes Intravenous  Once 07/25/22 0758 07/25/22 0942   07/25/22 0800  metroNIDAZOLE (FLAGYL) IVPB 500 mg        500 mg 100 mL/hr over 60 Minutes Intravenous  Once 07/25/22 0758 07/25/22 1159        Assessment/Plan Spina bfida with paraplegia and chronic sacral ulcers and osteomyelitis  Consult for fecal diversion  - Defer tx of Osteo + bacteremia to ID and possible septic joints to Ortho. He has been seen by Urology for perineal wound. Agree that this does not look like it needs urgent/emergent surgery/debridement at this time. They are planning follow up with Urology/plastic in the future. I  did not see an obvious fistula on exam. Wound care as outlined by WOCN.  - Agree with plan for fecal diversion. Discussed plan for diverting colostomy with the patient. The planned procedure and material risks were discussed with the patient. See discussion on 7/17. The patient's questions were answered to their satisfaction, they voiced understanding and elected to proceed with surgery. Plan OR this week as OR schedule allows. Tentatively plan tomorrow. Appreciate WOCN ostomy marking.   FEN - Reg. NPO at midnight.  VTE - SCDs, hold Lovenox after midnight. Primary team reports they are having PRBC ready ahead for OR incase as patients blood has to come from [facility].  ID - On Daptomycin and Zosyn. This should be adequate peri-op coverage Foley - SP tube (chronic) Plan - OR in the AM  I reviewed nursing notes, last 24 h vitals and pain scores, last 48 h intake and output, last 24 h labs and trends, and last 24 h imaging results.   LOS: 16 days    Anthony [name] , [facility] 08/11/2022, 9:08 AM Please see Amion for pager number during day hours 7:00am-4:30pm

## 2022-08-11 NOTE — Anesthesia Preprocedure Evaluation (Signed)
Anesthesia Evaluation  Patient identified by MRN, date of birth, ID band Patient awake    Reviewed: Allergy & Precautions, NPO status , Patient's Chart, lab work & pertinent test results, reviewed documented beta blocker date and time   History of Anesthesia Complications (+) AWARENESS UNDER ANESTHESIA and history of anesthetic complications  Airway Mallampati: I  TM Distance: >3 FB Neck ROM: Full    Dental  (+) Dental Advisory Given, Poor Dentition, Missing, Chipped   Pulmonary former smoker   Pulmonary exam normal        Cardiovascular hypertension, Pt. on medications and Pt. on home beta blockers + DVT  Normal cardiovascular exam   '24 [name] - EF 55-60%, mild LVH, mild MR    Neuro/Psych  PSYCHIATRIC DISORDERS Anxiety      Spina bifida, with paraplegia      GI/Hepatic negative GI ROS, Neg liver ROS,,,  Endo/Other  negative endocrine ROS    Renal/GU negative Renal ROS    Suprapubic catheter      Musculoskeletal  (+) Arthritis ,    Abdominal   Peds  Hematology  (+) Blood dyscrasia, anemia  Plt 801k    Anesthesia Other Findings Decubitus ulcers   Reproductive/Obstetrics                             Anesthesia Physical Anesthesia Plan  ASA: 3  Anesthesia Plan: General   Post-op Pain Management: Tylenol PO (pre-op)*   Induction: Intravenous  PONV Risk Score and Plan: 2 and Treatment may vary due to age or medical condition, Ondansetron, Dexamethasone and Midazolam  Airway Management Planned: Oral ETT  Additional Equipment: None  Intra-op Plan:   Post-operative Plan: Extubation in OR  Informed Consent: I have reviewed the patients History and Physical, chart, labs and discussed the procedure including the risks, benefits and alternatives for the proposed anesthesia with the patient or authorized representative who has indicated his/her understanding and acceptance.      Dental advisory given  Plan Discussed with: CRNA and Anesthesiologist  Anesthesia Plan Comments:         Anesthesia Quick Evaluation

## 2022-08-11 NOTE — Assessment & Plan Note (Addendum)
Polymicrobial bacterial infection - Dr. [name] 7/15 thinks there is stool in his wounds. General surgery has recommended diverting colostomy. IV Cefepime (7/5 - 7/16) & Flagyl (7/5 -7/16 ) completed.  - Diverting colostomy possibly tomorrow - Daptomycin (7/5 - ) - Zosyn (7/18 - )  - ID recommended switching to PO abx tomorrow s/p diverting colostomy likely with Augmentin/Doxycycline, will reassess after surgery per ID - Consider hydrotherapy for debridement purposes s/p diverting colostomy per ID - Lovenox held after PM dose dt/ plan for surgery tomorrow - BCID: strep spp + proteus, no growth over 5 days  - Ibuprofen PRN for fevers

## 2022-08-12 ENCOUNTER — Encounter (HOSPITAL_COMMUNITY)
Admission: EM | Disposition: A | Discharge status: Skilled Nursing Facility | Payer: Self-pay | Source: Home / Self Care | Attending: Family Medicine

## 2022-08-12 ENCOUNTER — Other Ambulatory Visit: Payer: Self-pay

## 2022-08-12 ENCOUNTER — Inpatient Hospital Stay (HOSPITAL_COMMUNITY): Payer: Medicare HMO | Admitting: Anesthesiology

## 2022-08-12 DIAGNOSIS — I10 Essential (primary) hypertension: Secondary | ICD-10-CM | POA: Diagnosis not present

## 2022-08-12 DIAGNOSIS — L89324 Pressure ulcer of left buttock, stage 4: Secondary | ICD-10-CM

## 2022-08-12 DIAGNOSIS — Z87891 Personal history of nicotine dependence: Secondary | ICD-10-CM

## 2022-08-12 DIAGNOSIS — A499 Bacterial infection, unspecified: Secondary | ICD-10-CM | POA: Diagnosis not present

## 2022-08-12 DIAGNOSIS — F419 Anxiety disorder, unspecified: Secondary | ICD-10-CM

## 2022-08-12 HISTORY — PX: LAPAROSCOPIC LOOP COLOSTOMY: SHX6816

## 2022-08-12 LAB — CBC
HCT: 25.2 % — ABNORMAL LOW (ref 39.0–52.0)
Hemoglobin: 7.3 g/dL — ABNORMAL LOW (ref 13.0–17.0)
MCH: 21.9 pg — ABNORMAL LOW (ref 26.0–34.0)
MCHC: 29 g/dL — ABNORMAL LOW (ref 30.0–36.0)
MCV: 75.7 fL — ABNORMAL LOW (ref 80.0–100.0)
Platelets: 773 10*3/uL — ABNORMAL HIGH (ref 150–400)
RBC: 3.33 MIL/uL — ABNORMAL LOW (ref 4.22–5.81)
RDW: 23.1 % — ABNORMAL HIGH (ref 11.5–15.5)
WBC: 13.1 10*3/uL — ABNORMAL HIGH (ref 4.0–10.5)
nRBC: 0 % (ref 0.0–0.2)

## 2022-08-12 LAB — BPAM RBC
Blood Product Expiration Date: 202408222359
Blood Product Expiration Date: 202408222359
Unit Type and Rh: 5100

## 2022-08-12 LAB — TYPE AND SCREEN
ABO/RH(D): O POS
Unit division: 0

## 2022-08-12 SURGERY — CREATION, COLOSTOMY, LOOP, LAPAROSCOPIC
Anesthesia: General | Site: Abdomen

## 2022-08-12 MED ORDER — 0.9 % SODIUM CHLORIDE (POUR BTL) OPTIME
TOPICAL | Status: DC | PRN
  Administered 2022-08-12: 1000 mL

## 2022-08-12 MED ORDER — ACETAMINOPHEN 500 MG PO TABS
1000.0000 mg | ORAL_TABLET | Freq: Once | ORAL | Status: DC
  Filled 2022-08-12: qty 2

## 2022-08-12 MED ORDER — FENTANYL CITRATE (PF) 250 MCG/5ML IJ SOLN
INTRAMUSCULAR | Status: AC
  Filled 2022-08-12: qty 5

## 2022-08-12 MED ORDER — CHLORHEXIDINE GLUCONATE 0.12 % MT SOLN
OROMUCOSAL | Status: AC
  Filled 2022-08-12: qty 15

## 2022-08-12 MED ORDER — FENTANYL CITRATE (PF) 100 MCG/2ML IJ SOLN
INTRAMUSCULAR | Status: AC
  Filled 2022-08-12: qty 2

## 2022-08-12 MED ORDER — OXYCODONE HCL 5 MG PO TABS
ORAL_TABLET | ORAL | Status: AC
  Filled 2022-08-12: qty 1

## 2022-08-12 MED ORDER — HYDROMORPHONE HCL 1 MG/ML IJ SOLN
0.5000 mg | INTRAMUSCULAR | Status: DC | PRN
  Administered 2022-08-13 (×2): 0.5 mg via INTRAVENOUS
  Filled 2022-08-12 (×2): qty 0.5

## 2022-08-12 MED ORDER — ORAL CARE MOUTH RINSE: 15.0000 mL | Freq: Once | OROMUCOSAL | Status: AC

## 2022-08-12 MED ORDER — PHENYLEPHRINE HCL (PRESSORS) 10 MG/ML IV SOLN
INTRAVENOUS | Status: DC | PRN
  Administered 2022-08-12 (×2): 160 ug via INTRAVENOUS
  Administered 2022-08-12 (×2): 80 ug via INTRAVENOUS

## 2022-08-12 MED ORDER — PROPOFOL 10 MG/ML IV BOLUS
INTRAVENOUS | Status: AC
  Filled 2022-08-12: qty 20

## 2022-08-12 MED ORDER — BUPIVACAINE-EPINEPHRINE (PF) 0.25% -1:200000 IJ SOLN
INTRAMUSCULAR | Status: AC
  Filled 2022-08-12: qty 30

## 2022-08-12 MED ORDER — LACTATED RINGERS IV SOLN: INTRAVENOUS | Status: DC

## 2022-08-12 MED ORDER — FENTANYL CITRATE (PF) 100 MCG/2ML IJ SOLN
25.0000 ug | INTRAMUSCULAR | Status: DC | PRN
  Administered 2022-08-12: 50 ug via INTRAVENOUS
  Administered 2022-08-12 (×2): 25 ug via INTRAVENOUS

## 2022-08-12 MED ORDER — MORPHINE SULFATE (PF) 2 MG/ML IV SOLN
1.0000 mg | INTRAVENOUS | Status: DC | PRN
  Administered 2022-08-12: 2 mg via INTRAVENOUS
  Filled 2022-08-12: qty 1

## 2022-08-12 MED ORDER — OXYCODONE HCL 5 MG PO TABS
10.0000 mg | ORAL_TABLET | ORAL | Status: DC | PRN
  Administered 2022-08-12 – 2022-08-15 (×10): 10 mg via ORAL
  Filled 2022-08-12 (×11): qty 2

## 2022-08-12 MED ORDER — MIDAZOLAM HCL 5 MG/5ML IJ SOLN
INTRAMUSCULAR | Status: DC | PRN
  Administered 2022-08-12: 2 mg via INTRAVENOUS

## 2022-08-12 MED ORDER — CHLORHEXIDINE GLUCONATE 0.12 % MT SOLN
15.0000 mL | Freq: Once | OROMUCOSAL | Status: AC
  Administered 2022-08-12: 15 mL via OROMUCOSAL

## 2022-08-12 MED ORDER — ENOXAPARIN SODIUM 80 MG/0.8ML IJ SOSY
1.0000 mg/kg | PREFILLED_SYRINGE | Freq: Two times a day (BID) | INTRAMUSCULAR | Status: DC
  Administered 2022-08-12 – 2022-08-14 (×4): 75 mg via SUBCUTANEOUS
  Filled 2022-08-12 (×4): qty 0.8

## 2022-08-12 MED ORDER — FENTANYL CITRATE (PF) 250 MCG/5ML IJ SOLN
INTRAMUSCULAR | Status: DC | PRN
  Administered 2022-08-12: 50 ug via INTRAVENOUS
  Administered 2022-08-12: 100 ug via INTRAVENOUS

## 2022-08-12 MED ORDER — OXYCODONE HCL 5 MG/5ML PO SOLN: 5.0000 mg | Freq: Once | ORAL | Status: AC | PRN

## 2022-08-12 MED ORDER — ONDANSETRON HCL 4 MG/2ML IJ SOLN
INTRAMUSCULAR | Status: DC | PRN
  Administered 2022-08-12: 4 mg via INTRAVENOUS

## 2022-08-12 MED ORDER — LACTATED RINGERS IV SOLN: INTRAVENOUS | Status: DC | PRN

## 2022-08-12 MED ORDER — PROPOFOL 10 MG/ML IV BOLUS
INTRAVENOUS | Status: DC | PRN
Start: 2022-08-12 — End: 2022-08-12
  Administered 2022-08-12: 160 mg via INTRAVENOUS

## 2022-08-12 MED ORDER — OXYCODONE HCL 5 MG PO TABS: 5.0000 mg | ORAL_TABLET | ORAL | Status: DC | PRN

## 2022-08-12 MED ORDER — ROCURONIUM BROMIDE 10 MG/ML (PF) SYRINGE
PREFILLED_SYRINGE | INTRAVENOUS | Status: DC | PRN
  Administered 2022-08-12: 20 mg via INTRAVENOUS
  Administered 2022-08-12: 60 mg via INTRAVENOUS

## 2022-08-12 MED ORDER — PHENYLEPHRINE HCL-NACL 20-0.9 MG/250ML-% IV SOLN
INTRAVENOUS | Status: DC | PRN
  Administered 2022-08-12: 40 ug/min via INTRAVENOUS

## 2022-08-12 MED ORDER — BUPIVACAINE-EPINEPHRINE 0.25% -1:200000 IJ SOLN
INTRAMUSCULAR | Status: DC | PRN
  Administered 2022-08-12: 12 mL

## 2022-08-12 MED ORDER — DEXAMETHASONE SODIUM PHOSPHATE 10 MG/ML IJ SOLN
INTRAMUSCULAR | Status: DC | PRN
  Administered 2022-08-12: 5 mg via INTRAVENOUS

## 2022-08-12 MED ORDER — PROMETHAZINE HCL 25 MG/ML IJ SOLN: 6.2500 mg | INTRAMUSCULAR | Status: DC | PRN

## 2022-08-12 MED ORDER — SUGAMMADEX SODIUM 200 MG/2ML IV SOLN
INTRAVENOUS | Status: DC | PRN
  Administered 2022-08-12: 200 mg via INTRAVENOUS

## 2022-08-12 MED ORDER — OXYCODONE HCL 5 MG PO TABS
5.0000 mg | ORAL_TABLET | Freq: Once | ORAL | Status: AC | PRN
  Administered 2022-08-12: 5 mg via ORAL

## 2022-08-12 SURGICAL SUPPLY — 65 items
ADH SKN CLS APL DERMABOND .7 (GAUZE/BANDAGES/DRESSINGS) ×2
APL PRP STRL LF DISP 70% ISPRP (MISCELLANEOUS) ×2
APPLIER CLIP 11 MED OPEN (CLIP) ×2
APPLIER CLIP 5 13 M/L LIGAMAX5 (MISCELLANEOUS)
APR CLP MED 11 20 MLT OPN (CLIP) ×2
APR CLP MED LRG 5 ANG JAW (MISCELLANEOUS)
BAG COUNTER SPONGE SURGICOUNT (BAG) ×2 IMPLANT
BAG SPNG CNTER NS LX DISP (BAG) ×2
BLADE CLIPPER SURG (BLADE) IMPLANT
CANISTER SUCT 3000ML PPV (MISCELLANEOUS) ×2 IMPLANT
CATH ROBINSON RED A/P 12FR (CATHETERS) ×1 IMPLANT
CHLORAPREP W/TINT 26 (MISCELLANEOUS) ×2 IMPLANT
CLIP APPLIE 11 MED OPEN (CLIP) ×1 IMPLANT
CLIP APPLIE 5 13 M/L LIGAMAX5 (MISCELLANEOUS) IMPLANT
CLSR STERI-STRIP ANTIMIC 1/2X4 (GAUZE/BANDAGES/DRESSINGS) ×1 IMPLANT
COVER SURGICAL LIGHT HANDLE (MISCELLANEOUS) ×4 IMPLANT
DERMABOND ADVANCED .7 DNX12 (GAUZE/BANDAGES/DRESSINGS) ×2 IMPLANT
DRAPE LAPAROSCOPIC ABDOMINAL (DRAPES) ×2 IMPLANT
DRSG OPSITE POSTOP 4X10 (GAUZE/BANDAGES/DRESSINGS) IMPLANT
DRSG OPSITE POSTOP 4X8 (GAUZE/BANDAGES/DRESSINGS) IMPLANT
ELECT CAUTERY BLADE 6.4 (BLADE) ×4 IMPLANT
ELECT REM PT RETURN 9FT ADLT (ELECTROSURGICAL) ×2
ELECTRODE REM PT RTRN 9FT ADLT (ELECTROSURGICAL) ×2 IMPLANT
GLOVE BIO SURGEON STRL SZ7 (GLOVE) ×4 IMPLANT
GLOVE BIOGEL PI IND STRL 7.5 (GLOVE) ×4 IMPLANT
GOWN STRL REUS W/ TWL LRG LVL3 (GOWN DISPOSABLE) ×12 IMPLANT
GOWN STRL REUS W/TWL LRG LVL3 (GOWN DISPOSABLE) ×12
IRRIG SUCT STRYKERFLOW 2 WTIP (MISCELLANEOUS)
IRRIGATION SUCT STRKRFLW 2 WTP (MISCELLANEOUS) IMPLANT
KIT BASIN OR (CUSTOM PROCEDURE TRAY) ×2 IMPLANT
KIT OSTOMY DRAINABLE 2.75 STR (WOUND CARE) ×1 IMPLANT
KIT SIGMOIDOSCOPE (SET/KITS/TRAYS/PACK) IMPLANT
KIT TURNOVER KIT B (KITS) ×2 IMPLANT
LIGASURE IMPACT 36 18CM CVD LR (INSTRUMENTS) IMPLANT
NS IRRIG 1000ML POUR BTL (IV SOLUTION) ×4 IMPLANT
PACK COLON (CUSTOM PROCEDURE TRAY) ×2 IMPLANT
PAD ARMBOARD 7.5X6 YLW CONV (MISCELLANEOUS) ×2 IMPLANT
PENCIL SMOKE EVACUATOR (MISCELLANEOUS) ×2 IMPLANT
SCISSORS LAP 5X35 DISP (ENDOMECHANICALS) IMPLANT
SET TUBE SMOKE EVAC HIGH FLOW (TUBING) ×2 IMPLANT
SLEEVE Z-THREAD 5X100MM (TROCAR) ×2 IMPLANT
SPONGE T-LAP 18X18 ~~LOC~~+RFID (SPONGE) IMPLANT
STAPLER VISISTAT 35W (STAPLE) ×2 IMPLANT
STRIP CLOSURE SKIN 1/2X4 (GAUZE/BANDAGES/DRESSINGS) ×1 IMPLANT
SURGILUBE 2OZ TUBE FLIPTOP (MISCELLANEOUS) IMPLANT
SUT ETHILON 2 0 FS 18 (SUTURE) ×1 IMPLANT
SUT MNCRL AB 4-0 PS2 18 (SUTURE) ×1 IMPLANT
SUT PDS AB 1 TP1 96 (SUTURE) ×4 IMPLANT
SUT PROLENE 2 0 CT2 30 (SUTURE) IMPLANT
SUT PROLENE 2 0 KS (SUTURE) IMPLANT
SUT SILK 2 0 SH CR/8 (SUTURE) ×2 IMPLANT
SUT SILK 2 0 TIES 10X30 (SUTURE) ×2 IMPLANT
SUT SILK 3 0 SH CR/8 (SUTURE) ×2 IMPLANT
SUT SILK 3 0 TIES 10X30 (SUTURE) ×2 IMPLANT
SUT VIC AB 3-0 SH 18 (SUTURE) ×1 IMPLANT
TOWEL GREEN STERILE (TOWEL DISPOSABLE) ×2 IMPLANT
TOWEL GREEN STERILE FF (TOWEL DISPOSABLE) ×2 IMPLANT
TRAY FOLEY MTR SLVR 14FR STAT (SET/KITS/TRAYS/PACK) IMPLANT
TRAY LAPAROSCOPIC MC (CUSTOM PROCEDURE TRAY) ×2 IMPLANT
TROCAR 11X100 Z THREAD (TROCAR) IMPLANT
TROCAR BALLN 12MMX100 BLUNT (TROCAR) ×1 IMPLANT
TROCAR Z-THREAD OPTICAL 5X100M (TROCAR) ×2 IMPLANT
TUBE CONNECTING 12X1/4 (SUCTIONS) ×4 IMPLANT
WARMER LAPAROSCOPE (MISCELLANEOUS) ×2 IMPLANT
YANKAUER SUCT BULB TIP NO VENT (SUCTIONS) ×1 IMPLANT

## 2022-08-12 NOTE — Op Note (Addendum)
Preoperative diagnosis: Spina bifida, chronic wounds Postoperative diagnosis: Same as above Procedure: Laparoscopic assisted loop sigmoid colostomy Surgeon: Dr. Harden Mo Assistant: Leary Roca, PA-C Estimated blood loss: Minimal Drains: None Complications: None Anesthesia: General Specimens: None Sponge needle count was correct completion Disposition recovery stable condition  Indications: This is a 52 year old male with spina bifida and chronic wounds around his pelvis as well as his lower extremities.  He is having difficulty keeping these wounds clean and it has come in with multiple infections.  We were asked to see him for possible diversion.  He is agreeable to proceeding with a fecal diversion to help with his wounds.  Procedure: After informed consent was obtained he was taken to the operating room.  He was given antibiotics.  SCDs were in place.  He was placed under general anesthesia without complication.  He was prepped and draped in the standard sterile surgical fashion.  A surgical timeout was then performed.  I made an incision above the umbilicus after infiltrating Marcaine.  I then grasped the fascia and incised it sharply.  I entered the peritoneum bluntly without injury.  I then placed a 0 Vicryl pursestring suture through the fascia.  I insufflated the abdomen to 15 mmHg pressure.  I then placed 2 additional 5 mm trocars in the upper abdomen under direct vision without complication.  I was able to identify the sigmoid colon.  He had been marked preoperatively by the ostomy nurses.  I then was able to with cautery release the white line of the left colon and sigmoid colon.  This was mobile enough that it came to the abdominal wall even with insufflation.  I then desufflated the abdomen.  I then cored out a site that had been marked previously with cautery.  I took this down and incised the fascia.  There was some bleeding that controlled with clips and a couple of sutures.   He really had no muscle to speak of at this site.  I then entered into the peritoneal cavity and brought the sigmoid colon up through this.  This reach just fine without really any tension when the abdomen was desufflated.  I then looked again laparoscopically and this all looked to be in good position.  I then removed the laparoscopic equipment.  I tied down my pursestring completely closing the supraumbilical defect.  These were then closed with 4-0 Monocryl and glue.  I then made a longitudinal incision in the sigmoid colon.  I then matured the loop sigmoid colostomy with 3-0 Vicryl suture.  I placed a red rubber catheter as a bar and sutured this into place with 2-0 nylon suture.  Both sides were patent and this was healthy.  We then placed an appliance.  Dressings were placed.  He tolerated this well was extubated and transferred recovery stable.  I called his mother postop and left a message per his request

## 2022-08-12 NOTE — Anesthesia Procedure Notes (Signed)
Procedure Name: Intubation Date/Time: 08/12/2022 9:00 AM  Performed by: Margarita Rana, CRNAPre-anesthesia Checklist: Patient identified, Patient being monitored, Timeout performed, Emergency Drugs available and Suction available Patient Re-evaluated:Patient Re-evaluated prior to induction Oxygen Delivery Method: Circle System Utilized Preoxygenation: Pre-oxygenation with 100% oxygen Induction Type: IV induction Ventilation: Oral airway inserted - appropriate to patient size Laryngoscope Size: Mac and 3 Grade View: Grade I Tube type: Oral Tube size: 7.5 mm Number of attempts: 1 Airway Equipment and Method: Stylet Placement Confirmation: ETT inserted through vocal cords under direct vision, positive ETCO2 and breath sounds checked- equal and bilateral Secured at: 22 cm Tube secured with: Tape Dental Injury: Teeth and Oropharynx as per pre-operative assessment

## 2022-08-12 NOTE — Progress Notes (Signed)
OT Cancellation Note  Patient Details Name: Anthony [name] MRN: [id number] DOB: 06/19/1970   Cancelled Treatment:    Reason Eval/Treat Not Completed: Patient at procedure or test/ unavailable. Pt off unit at OR, OT wil follow up as appropriate  Galen Manila 08/12/2022, 9:08 AM

## 2022-08-12 NOTE — Transfer of Care (Signed)
Immediate Anesthesia Transfer of Care Note  Patient: Anthony [name]  Procedure(s) Performed: LAPAROSCOPIC LOOP SIGMOID COLOSTOMY (Abdomen)  Patient Location: PACU  Anesthesia Type:General  Level of Consciousness: drowsy, patient cooperative, and responds to stimulation  Airway & Oxygen Therapy: Patient Spontanous Breathing and Patient connected to face mask oxygen  Post-op Assessment: Report given to RN and Post -op Vital signs reviewed and stable  Post vital signs: Reviewed and stable  Last Vitals:  Vitals Value Taken Time  BP 106/65 08/12/22 1024  Temp    Pulse 98 08/12/22 1026  Resp 24 08/12/22 1026  SpO2 99 % 08/12/22 1026  Vitals shown include unfiled device data.  Last Pain:  Vitals:   08/12/22 0835  TempSrc:   PainSc: 0-No pain      Patients Stated Pain Goal: 0 (08/04/22 0154)  Complications: No notable events documented.

## 2022-08-12 NOTE — Progress Notes (Signed)
Daily Progress Note Intern Pager: [phone]  Patient name: Anthony [name] Medical record number: [id number] Date of birth: 08/20/1970 Age: 52 y.o. Gender: male  Primary Care Provider: Erick Alley, DO Consultants: ID, Cardiology, Ortho, General Surgery  Code Status: Full Code   Pt Overview and Major Events to Date:  7/5: Admitted 7/6: Ortho consulted, stated no acute intervention indicated. ID consulted, recommended BSA bx 7/7: Hg dropped and patient received 1x pRBCs  7/8: Hg dropped again, 1x pRBCs, [name] ordered per ID recs  7/9: replaced catheter 7/12: received 1x pRBC 7/15: [name] - LVEF 55-60%, normal function 7/19: Surgery cancelled due to IT issues/outage 7/23: Diverting colostomy  Assessment and Plan: [name] A. Clutter is a 52 yo M with PMHx of spina bfida with paraplegia and chronic sacral ulcers admitted for septic shock 2/2 to infected sacral ulcers and osteomyelitis as supported by CT and MRI.   [facility]     * (Principal) Polymicrobial bacterial infection     Polymicrobial bacterial infection - Dr. [name] 7/15 thinks there is stool  in his wounds. General surgery performing diverting colostomy today. IV  Cefepime (7/5 - 7/16) & Flagyl (7/5 -7/16 ) completed.  - Diverting colostomy possibly tomorrow - Daptomycin (7/5 - ) - Zosyn (7/18 - )  - ID recommended switching to PO abx today s/p diverting colostomy likely  with Augmentin/Doxycycline, will reassess after surgery per ID - Consider hydrotherapy for debridement purposes s/p diverting colostomy  per ID - Lovenox held for surgery today - BCID: strep spp + proteus, no growth over 5 days  - Ibuprofen PRN for fevers        Acute Right shoulder pain     XR negative for fracture. - PT following - Multimodal pain management - Roxabin, Voltaren, Lidoderm and PRN Tylenol - Consider R shoulder steroid injection tomorrow        Decubitus ulcer due to spina bifida (HCC)     Will continue with aggressive wound  care and IV antibiotics and  transition to PO abx possibly after surgery today - Appreciate surgery recs - Wound care on board - ID on board         Anemia of infection and chronic disease     Has received 3 units pRBCS during this admission. Stable today. Hgb 7.3  this AM. - Requires blood from [place] (2 units in house). Type and Screen done  7/22. - Daily CBC         Acute deep vein thrombosis (DVT) of axillary vein of right upper  extremity (HCC)     DVT noted on R upper extremity. Patient R arm is not swollen and  nontender. - Lovenox held at this time due to his surgery, will check with surgery  when we should restart Lovenox - Continue to monitor       FEN/GI: NPO d/t procedure PPx: Lovenox held prior to surgery Dispo: Pending clinical improvement  Subjective:  Patient seen asleep this morning and lying comfortably in bed this morning.  Patient had a fever overnight 102.8 which resolved with ibuprofen.  He was also hypotensive and received a 500 cc bolus.  No concerns this morning.  Surgery for diverting colostomy today.  Objective: Temp:  [97.5 F (36.4 C)-102.8 F (39.3 C)] 97.5 F (36.4 C) (07/23 1024) Pulse Rate:  [94-131] 105 (07/23 1100) Resp:  [18-23] 23 (07/23 1100) BP: (96-117)/(57-68) 106/62 (07/23 1100) SpO2:  [92 %-100 %] 94 % (  07/23 1100) Weight:  [74.8 kg] 74.8 kg (07/23 0818)  Physical Exam: General: Asleep, laying comfortably in bed Cardiovascular: RRR. No M/R/G Respiratory: CTAB.  Normal work of breathing on RA Abdomen: Soft, non-tender, non-distended. Normoactive bowel sounds.  Laboratory: Most recent CBC Lab Results  Component Value Date   WBC 13.1 (H) 08/12/2022   HGB 7.3 (L) 08/12/2022   HCT 25.2 (L) 08/12/2022   MCV 75.7 (L) 08/12/2022   PLT 773 (H) 08/12/2022   Most recent BMP    Latest Ref Rng & Units 08/11/2022    2:03 AM  BMP  Glucose 70 - 99 mg/dL 409   BUN 6 - 20 mg/dL 9   Creatinine 8.11 - 9.14 mg/dL 7.82    Sodium 956 - 213 mmol/L 136   Potassium 3.5 - 5.1 mmol/L 3.8   Chloride 98 - 111 mmol/L 101   CO2 22 - 32 mmol/L 26   Calcium 8.9 - 10.3 mg/dL 8.8     Imaging/Diagnostic Tests: No new imaging.  [name], DO 08/12/2022, 11:13 AM PGY-1, [facility]  FPTS Intern pager: [phone], text pages welcome Secure chat group [facility] Teaching Service

## 2022-08-12 NOTE — Assessment & Plan Note (Addendum)
DVT noted on R upper extremity. Patient R arm is not swollen and nontender. - Lovenox held at this time due to his surgery, will check with surgery when we should restart Lovenox - Continue to monitor

## 2022-08-12 NOTE — Assessment & Plan Note (Signed)
Polymicrobial bacterial infection - Dr. [name] 7/15 thinks there is stool in his wounds. General surgery performing diverting colostomy today. IV Cefepime (7/5 - 7/16) & Flagyl (7/5 -7/16 ) completed.  - Diverting colostomy possibly tomorrow - Daptomycin (7/5 - ) - Zosyn (7/18 - )  - ID recommended switching to PO abx today s/p diverting colostomy likely with Augmentin/Doxycycline, will reassess after surgery per ID - Consider hydrotherapy for debridement purposes s/p diverting colostomy per ID - Lovenox held for surgery today - BCID: strep spp + proteus, no growth over 5 days  - Ibuprofen PRN for fevers

## 2022-08-12 NOTE — Assessment & Plan Note (Signed)
Will continue with aggressive wound care and IV antibiotics and transition to PO abx possibly after surgery today - Appreciate surgery recs - Wound care on board - ID on board

## 2022-08-12 NOTE — Anesthesia Postprocedure Evaluation (Signed)
Anesthesia Post Note  Patient: Anthony [name]  Procedure(s) Performed: LAPAROSCOPIC LOOP SIGMOID COLOSTOMY (Abdomen)     Patient location during evaluation: PACU Anesthesia Type: General Level of consciousness: awake and alert Pain management: pain level controlled Vital Signs Assessment: post-procedure vital signs reviewed and stable Respiratory status: spontaneous breathing, nonlabored ventilation and respiratory function stable Cardiovascular status: stable, blood pressure returned to baseline and tachycardic Anesthetic complications: no   No notable events documented.  Last Vitals:  Vitals:   08/12/22 1115 08/12/22 1140  BP: 106/66 114/70  Pulse: (!) 104 (!) 105  Resp: 20 20  Temp:  (!) 36.3 C  SpO2: 94% 93%                    [name]

## 2022-08-12 NOTE — Assessment & Plan Note (Addendum)
XR negative for fracture. - PT following - Multimodal pain management - Roxabin, Voltaren, Lidoderm and PRN Tylenol - Consider R shoulder steroid injection tomorrow

## 2022-08-12 NOTE — Progress Notes (Signed)
Day of Surgery   Subjective/Chief Complaint: No change, ready for surgery, has 2 units available   Objective: Vital signs in last 24 hours: Temp:  [97.6 F (36.4 C)-102.8 F (39.3 C)] 97.9 F (36.6 C) (07/23 0818) Pulse Rate:  [97-131] 97 (07/23 0818) Resp:  [18-22] 18 (07/23 0818) BP: (96-117)/(57-68) 117/68 (07/23 0818) SpO2:  [92 %-100 %] 97 % (07/23 0818) Weight:  [74.8 kg] 74.8 kg (07/23 0818) Last BM Date : 08/09/22  Intake/Output from previous day: 07/22 0701 - 07/23 0700 In: -  Out: 1325 [Urine:1325] Intake/Output this shift: Total I/O In: -  Out: 575 [Urine:575]  Ab soft nontender  Lab Results:  Recent Labs    08/11/22 0203 08/12/22 0333  WBC 11.1* 13.1*  HGB 7.7* 7.3*  HCT 27.2* 25.2*  PLT 801* 773*   BMET Recent Labs    08/10/22 0830 08/11/22 0203  NA 135 136  K 3.4* 3.8  CL 102 101  CO2 25 26  GLUCOSE 126* 110*  BUN 11 9  CREATININE 0.45* 0.49*  CALCIUM 8.4* 8.8*   PT/INR No results for input(s): "LABPROT", "INR" in the last 72 hours. ABG No results for input(s): "PHART", "HCO3" in the last 72 hours.  Invalid input(s): "PCO2", "PO2"  Studies/Results: No results found.  Anti-infectives: Anti-infectives (From admission, onward)    Start     Dose/Rate Route Frequency Ordered Stop   08/05/22 1500  piperacillin-tazobactam (ZOSYN) IVPB 3.375 g  Status:  Discontinued        3.375 g 100 mL/hr over 30 Minutes Intravenous Every 8 hours 08/05/22 0943 08/05/22 0944   08/05/22 1500  [MAR Hold]  piperacillin-tazobactam (ZOSYN) IVPB 3.375 g        (MAR Hold since Tue 08/12/2022 at 0815.Hold Reason: Transfer to a Procedural area)   3.375 g 12.5 mL/hr over 240 Minutes Intravenous Every 8 hours 08/05/22 0945     07/28/22 2200  metroNIDAZOLE (FLAGYL) tablet 500 mg  Status:  Discontinued        500 mg Oral Every 12 hours 07/28/22 1412 08/05/22 0943   07/26/22 1800  ceFEPIme (MAXIPIME) 2 g in sodium chloride 0.9 % 100 mL IVPB  Status:  Discontinued         2 g 200 mL/hr over 30 Minutes Intravenous Every 8 hours 07/26/22 1745 08/05/22 0943   07/25/22 2100  ceFEPIme (MAXIPIME) 2 g in sodium chloride 0.9 % 100 mL IVPB  Status:  Discontinued        2 g 200 mL/hr over 30 Minutes Intravenous Every 12 hours 07/25/22 0926 07/26/22 1745   07/25/22 2100  metroNIDAZOLE (FLAGYL) IVPB 500 mg  Status:  Discontinued        500 mg 100 mL/hr over 60 Minutes Intravenous Every 12 hours 07/25/22 1900 07/28/22 1412   07/25/22 1700  [MAR Hold]  DAPTOmycin (CUBICIN) 650 mg in sodium chloride 0.9 % IVPB        (MAR Hold since Tue 08/12/2022 at 0814.Hold Reason: Transfer to a Procedural area)   8 mg/kg  79.4 kg 126 mL/hr over 30 Minutes Intravenous Daily 07/25/22 1546     07/25/22 0800  ceFEPIme (MAXIPIME) 2 g in sodium chloride 0.9 % 100 mL IVPB        2 g 200 mL/hr over 30 Minutes Intravenous  Once 07/25/22 0758 07/25/22 0942   07/25/22 0800  metroNIDAZOLE (FLAGYL) IVPB 500 mg        500 mg 100 mL/hr over 60 Minutes Intravenous  Once  07/25/22 0758 07/25/22 1159       Assessment/Plan: Plan for loop colostomy today as discussed   Anthony [name] 08/12/2022

## 2022-08-12 NOTE — Assessment & Plan Note (Addendum)
Has received 3 units pRBCS during this admission. Stable today. Hgb 7.3 this AM. - Requires blood from [facility] (2 units in house). Type and Screen done 7/22. - Daily CBC

## 2022-08-13 ENCOUNTER — Encounter (HOSPITAL_COMMUNITY): Payer: Self-pay | Admitting: General Surgery

## 2022-08-13 DIAGNOSIS — Q059 Spina bifida, unspecified: Secondary | ICD-10-CM | POA: Diagnosis not present

## 2022-08-13 DIAGNOSIS — L89159 Pressure ulcer of sacral region, unspecified stage: Secondary | ICD-10-CM | POA: Diagnosis not present

## 2022-08-13 DIAGNOSIS — A419 Sepsis, unspecified organism: Secondary | ICD-10-CM | POA: Diagnosis not present

## 2022-08-13 DIAGNOSIS — G8220 Paraplegia, unspecified: Secondary | ICD-10-CM | POA: Diagnosis not present

## 2022-08-13 LAB — CBC
HCT: 25.5 % — ABNORMAL LOW (ref 39.0–52.0)
Hemoglobin: 7.4 g/dL — ABNORMAL LOW (ref 13.0–17.0)
MCH: 21.9 pg — ABNORMAL LOW (ref 26.0–34.0)
MCHC: 29 g/dL — ABNORMAL LOW (ref 30.0–36.0)
MCV: 75.4 fL — ABNORMAL LOW (ref 80.0–100.0)
Platelets: 890 10*3/uL — ABNORMAL HIGH (ref 150–400)
RBC: 3.38 MIL/uL — ABNORMAL LOW (ref 4.22–5.81)
RDW: 22.8 % — ABNORMAL HIGH (ref 11.5–15.5)
WBC: 11.8 10*3/uL — ABNORMAL HIGH (ref 4.0–10.5)
nRBC: 0 % (ref 0.0–0.2)

## 2022-08-13 MED ORDER — POLYETHYLENE GLYCOL 3350 17 G PO PACK
17.0000 g | PACK | Freq: Every day | ORAL | Status: DC
  Administered 2022-08-13 – 2022-08-14 (×2): 17 g via ORAL
  Filled 2022-08-13 (×2): qty 1

## 2022-08-13 NOTE — Progress Notes (Signed)
Occupational Therapy Treatment Patient Details Name: Anthony [name] MRN: [id number] DOB: Jul 03, 1970 Today's Date: 08/13/2022   History of present illness Pt is a 52 y.o. male who presented 07/25/22 with weakness and a fall. Pt admitted with severe sepsis secondary to sacral decubitus ulcer infection with concern for hip septic arthritis noted on CT. Imaging of R shoulder showed moderate acromioclavicular OA & mild distal lasteral subacromial spurring. S/p colostomy 7/23. PMH: complete paraplegia, multiple toe amputations, chronic sacral wounds, neurogenic bladder with suprapubic catheter, spina bifida, HTN   OT comments  Pt limiting session to bed level with focus on R shoulder exercises. Pt demonstrating ability to actively FF and ABD to 45 degrees. Worked on [employer] full range with pt grimacing with minimal pain only x 1. Ice applied to R shoulder at end of session. Pt reluctant to sit up due to new colostomy, educated in importance of EOB and OOB activity. Updated d/c disposition recommendations. Patient will benefit from intensive inpatient follow up therapy, >3 hours/day    Recommendations for follow up therapy are one component of a multi-disciplinary discharge planning process, led by the attending physician.  Recommendations may be updated based on patient status, additional functional criteria and insurance authorization.    Assistance Recommended at Discharge Frequent or constant Supervision/Assistance  Patient can return home with the following  A lot of help with bathing/dressing/bathroom;Assistance with cooking/housework;Assistance with feeding;Direct supervision/assist for medications management;Assist for transportation;Help with stairs or ramp for entrance;A little help with walking and/or transfers   Equipment Recommendations  Hospital bed    Recommendations for Other Services      Precautions / Restrictions Precautions Precautions: Fall;Other (comment) Precaution Comments:  paraplegia Restrictions Weight Bearing Restrictions: No       Mobility Bed Mobility Overal bed mobility: Needs Assistance Bed Mobility: Rolling Rolling: Min guard         General bed mobility comments: Pt declined sitting up, reports pain with new colostomy, educated pt in benefits of sitting and OOB to prevent complications.    Transfers                         Balance                                           ADL either performed or assessed with clinical judgement   ADL   Eating/Feeding: Set up;Bed level                                          Extremity/Trunk Assessment Upper Extremity Assessment RUE Deficits / Details: AAROM FF, ABD full range x 10, AROM 45 degrees FF and ABD RUE Coordination: decreased gross motor            Vision       Perception     Praxis      Cognition Arousal/Alertness: Awake/alert Behavior During Therapy: WFL for tasks assessed/performed Overall Cognitive Status: Within Functional Limits for tasks assessed                                          Exercises      Shoulder Instructions  General Comments      Pertinent Vitals/ Pain       Pain Assessment Pain Assessment: Faces Faces Pain Scale: Hurts a little bit Pain Location: R shoulder Pain Descriptors / Indicators: Discomfort, Grimacing, Guarding  Home Living                                          Prior Functioning/Environment              Frequency  Min 1X/week        Progress Toward Goals  OT Goals(current goals can now be found in the care plan section)  Progress towards OT goals: Progressing toward goals  Acute Rehab OT Goals OT Goal Formulation: With patient Time For Goal Achievement: 08/27/22 Potential to Achieve Goals: Good ADL Goals Pt Will Perform Grooming: with set-up;sitting Pt/caregiver will Perform Home Exercise Program: Increased ROM;Right  Upper extremity;Independently (AROM) Additional ADL Goal #1: Pt will perform bed moblity with supervision in preparation for ADLs.  Plan Discharge plan needs to be updated    Co-evaluation                 AM-PAC OT "6 Clicks" Daily Activity     Outcome Measure   Help from another person eating meals?: None Help from another person taking care of personal grooming?: A Little Help from another person toileting, which includes using toliet, bedpan, or urinal?: Total Help from another person bathing (including washing, rinsing, drying)?: A Lot Help from another person to put on and taking off regular upper body clothing?: A Lot Help from another person to put on and taking off regular lower body clothing?: Total 6 Click Score: 13    End of Session    OT Visit Diagnosis: Muscle weakness (generalized) (M62.81);Pain   Activity Tolerance Patient tolerated treatment well   Patient Left in bed;with call bell/phone within reach   Nurse Communication          Time: 6644-0347 OT Time Calculation (min): 29 min  Charges: OT General Charges $OT Visit: 1 Visit OT Treatments $Self Care/Home Management : 8-22 mins $Therapeutic Exercise: 8-22 mins  Berna Spare, OTR/L Acute Rehabilitation Services Office: [phone]  Evern Bio 08/13/2022, 1:57 PM

## 2022-08-13 NOTE — Progress Notes (Signed)
1 Day Post-Op   Subjective/Chief Complaint: Sore but otherwise ok, some liquid in bag, no n/v   Objective: Vital signs in last 24 hours: Temp:  [97.4 F (36.3 C)-99.2 F (37.3 C)] 98 F (36.7 C) (07/24 0820) Pulse Rate:  [94-110] 94 (07/24 0820) Resp:  [18-23] 20 (07/24 0820) BP: (97-120)/(61-85) 120/68 (07/24 0820) SpO2:  [93 %-99 %] 93 % (07/24 0820) Weight:  [80.7 kg] 80.7 kg (07/24 0543) Last BM Date : 08/12/22  Intake/Output from previous day: 07/23 0701 - 07/24 0700 In: 600 [I.V.:600] Out: 2075 [Urine:2025; Blood:50] Intake/Output this shift: Total I/O In: -  Out: 700 [Urine:700]  Ab approp tender incisoins clean stoma pink  Lab Results:  Recent Labs    08/12/22 0333 08/13/22 0700  WBC 13.1* 11.8*  HGB 7.3* 7.4*  HCT 25.2* 25.5*  PLT 773* 890*   BMET Recent Labs    08/11/22 0203  NA 136  K 3.8  CL 101  CO2 26  GLUCOSE 110*  BUN 9  CREATININE 0.49*  CALCIUM 8.8*   PT/INR No results for input(s): "LABPROT", "INR" in the last 72 hours. ABG No results for input(s): "PHART", "HCO3" in the last 72 hours.  Invalid input(s): "PCO2", "PO2"  Studies/Results: No results found.  Anti-infectives: Anti-infectives (From admission, onward)    Start     Dose/Rate Route Frequency Ordered Stop   08/05/22 1500  piperacillin-tazobactam (ZOSYN) IVPB 3.375 g  Status:  Discontinued        3.375 g 100 mL/hr over 30 Minutes Intravenous Every 8 hours 08/05/22 0943 08/05/22 0944   08/05/22 1500  piperacillin-tazobactam (ZOSYN) IVPB 3.375 g        3.375 g 12.5 mL/hr over 240 Minutes Intravenous Every 8 hours 08/05/22 0945     07/28/22 2200  metroNIDAZOLE (FLAGYL) tablet 500 mg  Status:  Discontinued        500 mg Oral Every 12 hours 07/28/22 1412 08/05/22 0943   07/26/22 1800  ceFEPIme (MAXIPIME) 2 g in sodium chloride 0.9 % 100 mL IVPB  Status:  Discontinued        2 g 200 mL/hr over 30 Minutes Intravenous Every 8 hours 07/26/22 1745 08/05/22 0943   07/25/22  2100  ceFEPIme (MAXIPIME) 2 g in sodium chloride 0.9 % 100 mL IVPB  Status:  Discontinued        2 g 200 mL/hr over 30 Minutes Intravenous Every 12 hours 07/25/22 0926 07/26/22 1745   07/25/22 2100  metroNIDAZOLE (FLAGYL) IVPB 500 mg  Status:  Discontinued        500 mg 100 mL/hr over 60 Minutes Intravenous Every 12 hours 07/25/22 1900 07/28/22 1412   07/25/22 1700  DAPTOmycin (CUBICIN) 650 mg in sodium chloride 0.9 % IVPB        8 mg/kg  79.4 kg 126 mL/hr over 30 Minutes Intravenous Daily 07/25/22 1546     07/25/22 0800  ceFEPIme (MAXIPIME) 2 g in sodium chloride 0.9 % 100 mL IVPB        2 g 200 mL/hr over 30 Minutes Intravenous  Once 07/25/22 0758 07/25/22 0942   07/25/22 0800  metroNIDAZOLE (FLAGYL) IVPB 500 mg        500 mg 100 mL/hr over 60 Minutes Intravenous  Once 07/25/22 0758 07/25/22 1159       Assessment/Plan: Spina bfida with paraplegia and chronic sacral ulcers and osteomyelitis  POD 1 lap loop sigmoid colostomy -bar can be removed around 10 days postop -fulls today -would place  on bowel regimen long term    FEN - fulls VTE - SCDs, can do lovenox ID - On Daptomycin and Zosyn Foley - SP tube (chronic)      Anthony [name] 08/13/2022

## 2022-08-13 NOTE — Consult Note (Signed)
WOC Nurse ostomy consult note Discussed with patient that pouch changes will be twice weekly.  He is going to perform his ostomy care at home.  He anticipates a lengthy hospital stay due to infection and a shoulder procedure he is having.  Unclear by the chart exactly what the plan is.   Stoma type/location: LUQ colostomy Stomal assessment/size: 4 cm x 2.5 cm oval, pale pink and moist Peristomal assessment: red rubber retention in place at 3 and 9 o'clock Treatment options for stomal/peristomal skin: barrier ring to promote seal Output only blood tinged liquid  Ostomy pouching: 2pc. 2 3/4" with barrier ring  Education provided: Pouch change performed.  Patient manipulated and molded barrier ring. I assisted with placement.  It is difficult to see from bed.  His right shoulder pain limits this as well.  He then cut the barrier in the pattern I drew on the back independently without difficulty.  He snapped the pouch and barrier together, placing it side facing slightly as that will work best with his wheelchair.  I assist with placement.  He is able to roll closed and open to simulate emptying.  Next visit, a larger mirror or sitting in chair may be beneficial.  We discuss emptying when 1/3 full.  I demonstrating burping for flatus and we discussed filtered pouches.  Ongoing teaching.  Enrolled patient in [employer] DC program: YEs will today.  Will follow.  Mike Gip MSN, RN, FNP-BC CWON Wound, Ostomy, Continence Nurse Outpatient [facility] [phone] Pager [phone]

## 2022-08-13 NOTE — Assessment & Plan Note (Addendum)
DVT noted on R upper extremity. Patient R arm is not swollen and nontender. - Lovenox restarted s/p surgery - Continue to monitor

## 2022-08-13 NOTE — Assessment & Plan Note (Addendum)
Polymicrobial bacterial infection - Dr. [name] 7/15 thinks there is stool in his wounds. General surgery performed colostomy 7/24. IV Cefepime (7/5 - 7/16) & Flagyl (7/5 -7/16 ) completed.  - Diverting colostomy: stoma clean, pink, with small amount of serosanguinous drainage in ostomy bag, slightly tender around stoma - Daptomycin (7/5 - ) - Zosyn (7/18 - )  - ID recommended switching to PO abx s/p diverting colostomy likely with Augmentin/Doxycycline, will reassess pending ortho recs about possible debridement - Consider hydrotherapy for debridement purposes - Lovenox restarted s/p surgery - BCID: strep spp + proteus, no growth - Ibuprofen PRN for fevers - Miralax/Glycolax on board for aggressive bowel regimen s/p colostomy - Oxycodone 10 mg q4h PRN + Dilaudid 0.5 mg q2h PRN for pain

## 2022-08-13 NOTE — Progress Notes (Addendum)
Nutrition Follow-up  DOCUMENTATION CODES:   Not applicable  INTERVENTION:  Continue Ensure Plus High Protein po BID, each supplement provides 350 kcal and 20 grams of protein.   Continue Magic cup TID with meals, each supplement provides 290 kcal and 9 grams of protein  Provide diet edu regarding colostomy   Continue MVI, zinc and Vitamin C   NUTRITION DIAGNOSIS:   Increased nutrient needs related to wound healing as evidenced by estimated needs. -ongoing   GOAL:   Patient will meet greater than or equal to 90% of their needs -progressing   MONITOR:   PO intake, Supplement acceptance, Labs, Weight trends, Skin, I & O's  REASON FOR ASSESSMENT:   Consult Assessment of nutrition requirement/status  ASSESSMENT:   52 y.o. male with past medical history of spina bifida with secondary paraplegia, chronic decubitis uclers with osteomyelitis, chronic foley d/t neurogenic bladder, wheelchair bound, endocarditis and hypertension who is admitted with sepsis, R > L hip cellulitis, osteomyelitis with bony destruction of pubic rami and RUE DVT.  7/23 S/p laparoscopic loop sigmoid colostomy  7/23 started on clears 7/24 advanced to full liquid   Visited patient at bedside who reports he is doing well, all things considered. He is tolerating po intake and was accepting to RD bringing him an Ensure along with colostomy diet edu.   No N/V. MD suggests chronic bowel regimen. Patient tolerated his first pouch change and reports it was mainly serosanguious output.    Labs: reviewed Meds: reviewed  Wt: admit- 175#, current-178# PO: no meals documented since 7/21 I/O's: -23.5 L since admission   Diet Order:   Diet Order             Diet full liquid Room service appropriate? Yes; Fluid consistency: Thin  Diet effective now                   EDUCATION NEEDS:   Education needs have been addressed  Skin:  Skin Assessment: Reviewed RN Assessment (Stage IV decubitus ulcers R &  L sacrum, R hip ulcer) Skin Integrity Issues:: Stage II, Stage IV Stage II: sacrum Stage IV: sacrum, R hip  Last BM:  7/24 via colostomy  Height:   Ht Readings from Last 1 Encounters:  08/12/22 5\' 8"  (1.727 m)    Weight:   Wt Readings from Last 1 Encounters:  08/13/22 80.7 kg    Ideal Body Weight:  70 kg  BMI:  Body mass index is 27.06 kg/m.  Estimated Nutritional Needs:   Kcal:  2100-2400kcal/day  Protein:  105-120g/day  Fluid:  2.1-2.4L/day    Leodis Rains, RDN, LDN  Clinical Nutrition

## 2022-08-13 NOTE — TOC Progression Note (Signed)
Transition of Care [facility]) - Progression Note    Patient Details  Name: Anthony [name] MRN: [id number] Date of Birth: [birth year]  [facility] of [facility]) CM/SW Contact  Eduard Roux, [place] Phone Number: 08/13/2022, 1:54 PM  Clinical Narrative:     TOC continues to follow   Expected Discharge Plan: Skilled Nursing Facility Barriers to Discharge: Continued Medical Work up  Expected Discharge Plan and Services In-house Referral: Clinical Social Work   Post Acute Care Choice: Skilled Nursing Facility Living arrangements for the past 2 months: Single Family Home                                       Social Determinants of Health (SDOH) Interventions SDOH Screenings   Food Insecurity: No Food Insecurity (07/26/2022)  Housing: Low Risk  (07/26/2022)  Transportation Needs: No Transportation Needs (07/26/2022)  Utilities: Not At Risk (07/26/2022)  Alcohol Screen: Low Risk  (01/23/2022)  Depression (PHQ2-9): Low Risk  (02/27/2022)  Financial Resource Strain: Medium Risk (01/23/2022)  Physical Activity: Inactive (01/23/2022)  Social Connections: Socially Isolated (01/23/2022)  Stress: Stress Concern Present (01/23/2022)  Tobacco Use: Medium Risk (08/04/2022)    Readmission Risk Interventions     No data to display

## 2022-08-13 NOTE — Assessment & Plan Note (Addendum)
Will continue with aggressive wound care and IV antibiotics and transition to PO abx possibly after determining if Ortho would like to proceed with any further intervention at this time. - Appreciate surgery recs - Wound care on board - ID on board

## 2022-08-13 NOTE — Assessment & Plan Note (Addendum)
XR negative for fracture. - PT following - Multimodal pain management - Roxabin, Voltaren, Lidoderm and PRN Tylenol - Consider R shoulder steroid injection in the future

## 2022-08-13 NOTE — Assessment & Plan Note (Addendum)
Has received 3 units pRBCS during this admission. Stable today. Hgb 7.4 this AM. - Requires blood from [facility] (2 units in house). Type and Screen done 7/22. - Daily CBC

## 2022-08-13 NOTE — Progress Notes (Signed)
Physical Therapy Treatment Patient Details Name: Anthony [name] MRN: [id number] DOB: 01-02-1971 Today's Date: 08/13/2022   History of Present Illness Pt is a 52 y.o. male who presented 07/25/22 with weakness and a fall. Pt admitted with severe sepsis secondary to sacral decubitus ulcer infection with concern for hip septic arthritis noted on CT. Imaging of R shoulder showed moderate acromioclavicular OA & mild distal lasteral subacromial spurring. S/p colostomy 7/23. PMH: complete paraplegia, multiple toe amputations, chronic sacral wounds, neurogenic bladder with suprapubic catheter, spina bifida, HTN    PT Comments  Pt declining EOB/OOB this session due to pain from yesterday's procedure. He underwent laparoscopic colostomy. Educated on need for rolling R/L while in bed for pressure relief. Performed exercises RUE in supine. POC remains appropriate. Goals updated as indicated.     Assistance Recommended at Discharge Intermittent Supervision/Assistance  If plan is discharge home, recommend the following:  Can travel by private vehicle    Two people to help with walking and/or transfers;A lot of help with bathing/dressing/bathroom;Assistance with cooking/housework;Assist for transportation;Help with stairs or ramp for entrance      Equipment Recommendations  Hospital bed    Recommendations for Other Services       Precautions / Restrictions Precautions Precautions: Fall;Other (comment) Precaution Comments: paraplegia     Mobility  Bed Mobility   Bed Mobility: Rolling Rolling: Min guard         General bed mobility comments: Pt declining EOB/OOB due to pain s/p procedure. Pt underwent colostomy 7/23.    Transfers                        Ambulation/Gait                   Stairs             Wheelchair Mobility     Tilt Bed    Modified Rankin (Stroke Patients Only)       Balance                                             Cognition Arousal/Alertness: Awake/alert Behavior During Therapy: WFL for tasks assessed/performed Overall Cognitive Status: Within Functional Limits for tasks assessed                                          Exercises General Exercises - Upper Extremity Shoulder Flexion: AAROM, Right, 10 reps, Supine Shoulder ABduction: AAROM, Right, 10 reps, Supine Elbow Flexion: AROM, Right, 10 reps, Supine Elbow Extension: AROM, Right, 10 reps, Supine Other Exercises Other Exercises: shoulder shrugs/circles x 10 reps    General Comments        Pertinent Vitals/Pain Pain Assessment Pain Assessment: Faces Faces Pain Scale: Hurts little more Pain Location: abdomen Pain Descriptors / Indicators: Discomfort, Grimacing, Guarding Pain Intervention(s): Limited activity within patient's tolerance    Home Living                          Prior Function            PT Goals (current goals can now be found in the care plan section) Acute Rehab PT Goals Patient Stated Goal: home PT Goal Formulation: With patient Time  For Goal Achievement: 08/27/22 Potential to Achieve Goals: Good Progress towards PT goals: Not progressing toward goals - comment (limited today by pain from colostomy sx)    Frequency    Min 1X/week      PT Plan Current plan remains appropriate    Co-evaluation              AM-PAC PT "6 Clicks" Mobility   Outcome Measure  Help needed turning from your back to your side while in a flat bed without using bedrails?: A Little Help needed moving from lying on your back to sitting on the side of a flat bed without using bedrails?: A Lot Help needed moving to and from a bed to a chair (including a wheelchair)?: A Lot Help needed standing up from a chair using your arms (e.g., wheelchair or bedside chair)?: Total Help needed to walk in hospital room?: Total Help needed climbing 3-5 steps with a railing? : Total 6 Click Score: 10     End of Session   Activity Tolerance: Patient limited by pain Patient left: in bed;with call bell/phone within reach Nurse Communication: Mobility status PT Visit Diagnosis: Muscle weakness (generalized) (M62.81);History of falling (Z91.81);Pain     Time: 8657-8469 PT Time Calculation (min) (ACUTE ONLY): 16 min  Charges:    $Therapeutic Exercise: 8-22 mins PT General Charges $$ ACUTE PT VISIT: 1 Visit                     Ferd Glassing., PT  Office # [phone]    Ilda Foil 08/13/2022, 12:06 PM

## 2022-08-13 NOTE — Progress Notes (Addendum)
Daily Progress Note Intern Pager: [phone]  Patient name: Anthony [name] Medical record number: [id number] Date of birth: [birth year] Age: 52 y.o. Gender: male  Primary Care Provider: Erick Alley, DO Consultants:  ID, Cardiology, Ortho, General Surgery  Code Status: Full Code  Pt Overview and Major Events to Date:  7/5: Admitted 7/6: Ortho consulted, stated no acute intervention indicated. ID consulted, recommended BSA bx 7/7: Hg dropped and patient received 1x pRBCs  7/8: Hg dropped again, 1x pRBCs, [name] ordered per ID recs  7/9: replaced catheter 7/12: received 1x pRBC 7/15: [name] - LVEF 55-60%, normal function 7/19: Surgery cancelled due to IT issues/outage 7/23: Diverting colostomy   Assessment and Plan: Anthony Hill A. [name] is a 52 yo M with PMHx of spina bfida with paraplegia and chronic sacral ulcers admitted for septic shock 2/2 to infected sacral ulcers and osteomyelitis as supported by CT and MRI.   [facility]     * (Principal) Polymicrobial bacterial infection     Polymicrobial bacterial infection - Dr. [name] 7/15 thinks there is stool  in his wounds. General surgery performed colostomy 7/24. IV Cefepime (7/5  - 7/16) & Flagyl (7/5 -7/16 ) completed.  - Diverting colostomy: stoma clean, pink, with small amount of  serosanguinous drainage in ostomy bag, slightly tender around stoma - Daptomycin (7/5 - ) - Zosyn (7/18 - )  - ID recommended switching to PO abx s/p diverting colostomy likely with  Augmentin/Doxycycline, will reassess pending ortho recs about possible  debridement - Consider hydrotherapy for debridement purposes - Lovenox restarted s/p surgery - BCID: strep spp + proteus, no growth - Ibuprofen PRN for fevers - Miralax/Glycolax on board for aggressive bowel regimen s/p colostomy - Oxycodone 10 mg q4h PRN + Dilaudid 0.5 mg q2h PRN for pain        Acute Right shoulder pain     XR negative for fracture. - PT following - Multimodal pain  management - Roxabin, Voltaren, Lidoderm and PRN Tylenol - Consider R shoulder steroid injection in the future        Decubitus ulcer due to spina bifida (HCC)     Will continue with aggressive wound care and IV antibiotics and  transition to PO abx possibly after determining if Ortho would like to  proceed with any further intervention at this time. - Appreciate surgery recs - Wound care on board - ID on board         Anemia of infection and chronic disease     Has received 3 units pRBCS during this admission. Stable today. Hgb 7.4  this AM. - Requires blood from [facility] (2 units in house). Type and Screen done  7/22. - Daily CBC         Acute deep vein thrombosis (DVT) of axillary vein of right upper  extremity (HCC)     DVT noted on R upper extremity. Patient R arm is not swollen and  nontender. - Lovenox restarted s/p surgery - Continue to monitor       FEN/GI: Full Liquid Diet PPx: Lovenox Dispo: Pending clinical improvement  Subjective:  Patient is doing well this morning and would like to consider a long-acting pain relief.  He shares that the prior facility had received that and seems to help him better than the oxycodone and Dilaudid.  He has not taken any Dilaudid.  Says that his pain is minimal this morning, but he did have some overnight.  Notes  that his right shoulder is improving and is interested in getting an injection to help his pain.  Objective: Temp:  [97.8 F (36.6 C)-99.2 F (37.3 C)] 98 F (36.7 C) (07/24 0820) Pulse Rate:  [94-110] 94 (07/24 0820) Resp:  [18-20] 20 (07/24 0820) BP: (112-120)/(68-85) 120/68 (07/24 0820) SpO2:  [93 %-96 %] 93 % (07/24 0820) Weight:  [80.7 kg] 80.7 kg (07/24 0543)  Physical Exam: General: NAD, awake and alert, laying comfortably in bed Cardiovascular: RRR. No M/R/G Respiratory: CTAB.  Normal work of breathing on RA Abdomen: Soft, non-tender, non-distended. Normoactive bowel sounds. Stoma clean, pink, with  small amount of serosanguinous drainage in ostomy bag, slightly tender around stoma  Laboratory: Most recent CBC Lab Results  Component Value Date   WBC 11.8 (H) 08/13/2022   HGB 7.4 (L) 08/13/2022   HCT 25.5 (L) 08/13/2022   MCV 75.4 (L) 08/13/2022   PLT 890 (H) 08/13/2022   Most recent BMP    Latest Ref Rng & Units 08/11/2022    2:03 AM  BMP  Glucose 70 - 99 mg/dL 563   BUN 6 - 20 mg/dL 9   Creatinine 8.75 - 6.43 mg/dL 3.29   Sodium 518 - 841 mmol/L 136   Potassium 3.5 - 5.1 mmol/L 3.8   Chloride 98 - 111 mmol/L 101   CO2 22 - 32 mmol/L 26   Calcium 8.9 - 10.3 mg/dL 8.8     Imaging/Diagnostic Tests: No new imaging.  [name], DO 08/13/2022, 12:28 PM PGY-1, [facility]  FPTS Intern pager: [phone], text pages welcome Secure chat group [facility] Teaching Service

## 2022-08-13 NOTE — Progress Notes (Signed)
Subjective: [name] negative for endocarditis Continued intermittent fever despite dapto/cefepime-flagyl --> dapto/piptazo S/p diverting colostomy 7/23  No complaint today    Antibiotics:  Anti-infectives (From admission, onward)    Start     Dose/Rate Route Frequency Ordered Stop   08/05/22 1500  piperacillin-tazobactam (ZOSYN) IVPB 3.375 g  Status:  Discontinued        3.375 g 100 mL/hr over 30 Minutes Intravenous Every 8 hours 08/05/22 0943 08/05/22 0944   08/05/22 1500  piperacillin-tazobactam (ZOSYN) IVPB 3.375 g        3.375 g 12.5 mL/hr over 240 Minutes Intravenous Every 8 hours 08/05/22 0945     07/28/22 2200  metroNIDAZOLE (FLAGYL) tablet 500 mg  Status:  Discontinued        500 mg Oral Every 12 hours 07/28/22 1412 08/05/22 0943   07/26/22 1800  ceFEPIme (MAXIPIME) 2 g in sodium chloride 0.9 % 100 mL IVPB  Status:  Discontinued        2 g 200 mL/hr over 30 Minutes Intravenous Every 8 hours 07/26/22 1745 08/05/22 0943   07/25/22 2100  ceFEPIme (MAXIPIME) 2 g in sodium chloride 0.9 % 100 mL IVPB  Status:  Discontinued        2 g 200 mL/hr over 30 Minutes Intravenous Every 12 hours 07/25/22 0926 07/26/22 1745   07/25/22 2100  metroNIDAZOLE (FLAGYL) IVPB 500 mg  Status:  Discontinued        500 mg 100 mL/hr over 60 Minutes Intravenous Every 12 hours 07/25/22 1900 07/28/22 1412   07/25/22 1700  DAPTOmycin (CUBICIN) 650 mg in sodium chloride 0.9 % IVPB        8 mg/kg  79.4 kg 126 mL/hr over 30 Minutes Intravenous Daily 07/25/22 1546     07/25/22 0800  ceFEPIme (MAXIPIME) 2 g in sodium chloride 0.9 % 100 mL IVPB        2 g 200 mL/hr over 30 Minutes Intravenous  Once 07/25/22 0758 07/25/22 0942   07/25/22 0800  metroNIDAZOLE (FLAGYL) IVPB 500 mg        500 mg 100 mL/hr over 60 Minutes Intravenous  Once 07/25/22 0758 07/25/22 1159       Medications: Scheduled Meds:  ascorbic acid  500 mg Oral BID   Chlorhexidine Gluconate Cloth  6 each Topical Daily    enoxaparin (LOVENOX) injection  1 mg/kg Subcutaneous Q12H   feeding supplement  237 mL Oral TID BM   flecainide  50 mg Oral BID   lidocaine  1-2 patch Transdermal Q24H   loratadine  10 mg Oral Daily   methocarbamol  1,000 mg Oral Q8H   multivitamin with minerals  1 tablet Oral Daily   polyethylene glycol  17 g Oral Daily   sodium chloride flush  10-40 mL Intracatheter Q12H   zinc sulfate  220 mg Oral Daily   Continuous Infusions:  DAPTOmycin (CUBICIN) 650 mg in sodium chloride 0.9 % IVPB 650 mg (08/12/22 1440)   piperacillin-tazobactam (ZOSYN)  IV 3.375 g (08/13/22 0704)   PRN Meds:.acetaminophen, diclofenac Sodium, HYDROmorphone (DILAUDID) injection, hydrOXYzine, ibuprofen, Muscle Rub, oxyCODONE, prochlorperazine, sodium chloride flush    Objective: Weight change:   Intake/Output Summary (Last 24 hours) at 08/13/2022 1204 Last data filed at 08/13/2022 0709 Gross per 24 hour  Intake --  Output 2050 ml  Net -2050 ml   Blood pressure 120/68, pulse 94, temperature 98 F (36.7 C), temperature source Oral, resp. rate 20, height 5\' 8"  (1.727  m), weight 80.7 kg, SpO2 93%. Temp:  [97.8 F (36.6 C)-99.2 F (37.3 C)] 98 F (36.7 C) (07/24 0820) Pulse Rate:  [94-110] 94 (07/24 0820) Resp:  [18-20] 20 (07/24 0820) BP: (112-120)/(68-85) 120/68 (07/24 0820) SpO2:  [93 %-96 %] 93 % (07/24 0820) Weight:  [80.7 kg] 80.7 kg (07/24 0543)  Physical Exam: General/constitutional: no distress, pleasant HEENT: Normocephalic, PER, Conj Clear, EOMI, Oropharynx clear Neck supple CV: rrr no mrg Lungs: clear to auscultation, normal respiratory effort Abd: Soft, Nontender -- left sided colostomy in place Ext: no edema Skin/msk: bilateral atropic LE Neuro: paraplegic Gu: deferred today  CBC: Lab Results  Component Value Date   WBC 11.8 (H) 08/13/2022   HGB 7.4 (L) 08/13/2022   HCT 25.5 (L) 08/13/2022   MCV 75.4 (L) 08/13/2022   PLT 890 (H) 08/13/2022      BMET Recent Labs     08/11/22 0203  NA 136  K 3.8  CL 101  CO2 26  GLUCOSE 110*  BUN 9  CREATININE 0.49*  CALCIUM 8.8*     Liver Panel  Recent Labs    08/11/22 0203  PROT 7.5  ALBUMIN 1.5*  AST 27  ALT 24  ALKPHOS 228*  BILITOT 0.3        Sedimentation Rate No results for input(s): "ESRSEDRATE" in the last 72 hours. C-Reactive Protein No results for input(s): "CRP" in the last 72 hours.  Micro Results: Recent Results (from the past 720 hour(s))  Resp panel by RT-PCR (RSV, Flu A&B, Covid) Anterior Nasal Swab     Status: None   Collection Time: 07/25/22  7:58 AM   Specimen: Anterior Nasal Swab  Result Value Ref Range Status   SARS Coronavirus 2 by RT PCR NEGATIVE NEGATIVE Final   Influenza A by PCR NEGATIVE NEGATIVE Final   Influenza B by PCR NEGATIVE NEGATIVE Final    Comment: (NOTE) The Xpert Xpress SARS-CoV-2/FLU/RSV plus assay is intended as an aid in the diagnosis of influenza from Nasopharyngeal swab specimens and should not be used as a sole basis for treatment. Nasal washings and aspirates are unacceptable for Xpert Xpress SARS-CoV-2/FLU/RSV testing.  Fact Sheet for Patients: BloggerCourse.com  Fact Sheet for Healthcare Providers: SeriousBroker.it  This test is not yet approved or cleared by the [place] FDA and has been authorized for detection and/or diagnosis of SARS-CoV-2 by FDA under an Emergency Use Authorization (EUA). This EUA will remain in effect (meaning this test can be used) for the duration of the COVID-19 declaration under Section 564(b)(1) of the Act, 21 U.S.C. section 360bbb-3(b)(1), unless the authorization is terminated or revoked.     Resp Syncytial Virus by PCR NEGATIVE NEGATIVE Final    Comment: (NOTE) Fact Sheet for Patients: BloggerCourse.com  Fact Sheet for Healthcare Providers: SeriousBroker.it  This test is not yet approved or  cleared by the [place] FDA and has been authorized for detection and/or diagnosis of SARS-CoV-2 by FDA under an Emergency Use Authorization (EUA). This EUA will remain in effect (meaning this test can be used) for the duration of the COVID-19 declaration under Section 564(b)(1) of the Act, 21 U.S.C. section 360bbb-3(b)(1), unless the authorization is terminated or revoked.  Performed at [facility], [address], [place], [place] [address]   Blood Culture (routine x 2)     Status: Abnormal   Collection Time: 07/25/22  8:15 AM   Specimen: BLOOD  Result Value Ref Range Status   Specimen Description BLOOD SITE NOT SPECIFIED  Final   Special Requests   Final    BOTTLES DRAWN AEROBIC AND ANAEROBIC Blood Culture results may not be optimal due to an inadequate volume of blood received in culture bottles   Culture  Setup Time   Final    GRAM POSITIVE COCCI IN BOTH AEROBIC AND ANAEROBIC BOTTLES GRAM VARIABLE ROD CRITICAL RESULT CALLED TO, READ BACK BY AND VERIFIED WITH: PHARMD Merlene Morse [id number] @ 2221 FH    Culture (A)  Final    PROTEUS VULGARIS STREPTOCOCCUS DYSGALACTIAE SUSCEPTIBILITIES PERFORMED ON PREVIOUS CULTURE WITHIN THE LAST 5 DAYS. Performed at [facility], [address], [place], [place] [address]    Report Status 07/29/2022 FINAL  Final   Organism ID, Bacteria STREPTOCOCCUS DYSGALACTIAE  Final      Susceptibility   Streptococcus dysgalactiae - MIC*    PENICILLIN <=0.06 SENSITIVE Sensitive     CEFTRIAXONE <=0.12 SENSITIVE Sensitive     ERYTHROMYCIN <=0.12 SENSITIVE Sensitive     LEVOFLOXACIN 0.5 SENSITIVE Sensitive     VANCOMYCIN 0.5 SENSITIVE Sensitive     * STREPTOCOCCUS DYSGALACTIAE  Blood Culture (routine x 2)     Status: Abnormal   Collection Time: 07/25/22  8:21 AM   Specimen: BLOOD  Result Value Ref Range Status   Specimen Description BLOOD SITE NOT SPECIFIED  Final   Special Requests   Final    BOTTLES DRAWN AEROBIC AND ANAEROBIC Blood  Culture results may not be optimal due to an inadequate volume of blood received in culture bottles   Culture  Setup Time   Final    GRAM POSITIVE COCCI IN CHAINS GRAM POSITIVE RODS ANAEROBIC BOTTLE ONLY CRITICAL RESULT CALLED TO, READ BACK BY AND VERIFIED WITH: PHARMD CAREN AMEND ON 07/25/22 @ 1934 BY DRT CRITICAL RESULT CALLED TO, READ BACK BY AND VERIFIED WITH: PHARMD Merlene Morse [id number] @ 2221 FH GRAM POSITIVE COCCI IN CHAINS GRAM NEGATIVE RODS BOTTLES DRAWN AEROBIC ONLY    Culture (A)  Final    PROTEUS VULGARIS STREPTOCOCCUS DYSGALACTIAE SUSCEPTIBILITIES PERFORMED ON PREVIOUS CULTURE WITHIN THE LAST 5 DAYS. GRAM POSITIVE RODS FROM GRAN STAIN UNABLE TO ISOLATE FROM CULTURE CRITICAL RESULT CALLED TO, READ BACK BY AND VERIFIED WITH: Royce Macadamia [id number] AT 1037 BY CM Performed at [facility], [address] [place] [address], [place], [place] [address]    Report Status 07/29/2022 FINAL  Final   Organism ID, Bacteria PROTEUS VULGARIS  Final      Susceptibility   Proteus vulgaris - MIC*    AMPICILLIN >=32 RESISTANT Resistant     CEFEPIME <=0.12 SENSITIVE Sensitive     CEFTAZIDIME <=1 SENSITIVE Sensitive     CIPROFLOXACIN <=0.25 SENSITIVE Sensitive     GENTAMICIN <=1 SENSITIVE Sensitive     IMIPENEM 4 SENSITIVE Sensitive     TRIMETH/SULFA <=20 SENSITIVE Sensitive     AMPICILLIN/SULBACTAM 16 INTERMEDIATE Intermediate     PIP/TAZO <=4 SENSITIVE Sensitive     * PROTEUS VULGARIS  Urine Culture     Status: Abnormal   Collection Time: 07/25/22  8:21 AM   Specimen: Urine, Random  Result Value Ref Range Status   Specimen Description URINE, RANDOM  Final   Special Requests   Final    NONE Reflexed from [id number] Performed at [facility], [address] [place], [place] [address]    Culture MULTIPLE SPECIES PRESENT, SUGGEST RECOLLECTION (A)  Final   Report Status 07/27/2022 FINAL  Final  Blood Culture ID Panel (Reflexed)  Status: Abnormal   Collection Time: 07/25/22  8:21 AM  Result  Value Ref Range Status   Enterococcus faecalis NOT DETECTED NOT DETECTED Final   Enterococcus Faecium NOT DETECTED NOT DETECTED Final   Listeria monocytogenes NOT DETECTED NOT DETECTED Final   Staphylococcus species NOT DETECTED NOT DETECTED Final   Staphylococcus aureus (BCID) NOT DETECTED NOT DETECTED Final   Staphylococcus epidermidis NOT DETECTED NOT DETECTED Final   Staphylococcus lugdunensis NOT DETECTED NOT DETECTED Final   Streptococcus species DETECTED (A) NOT DETECTED Final    Comment: Not Enterococcus species, Streptococcus agalactiae, Streptococcus pyogenes, or Streptococcus pneumoniae. CRITICAL RESULT CALLED TO, READ BACK BY AND VERIFIED WITH: Mercer Pod [id number] @ 2221 FH    Streptococcus agalactiae NOT DETECTED NOT DETECTED Final   Streptococcus pneumoniae NOT DETECTED NOT DETECTED Final   Streptococcus pyogenes NOT DETECTED NOT DETECTED Final   A.calcoaceticus-baumannii NOT DETECTED NOT DETECTED Final   Bacteroides fragilis NOT DETECTED NOT DETECTED Final   Enterobacterales DETECTED (A) NOT DETECTED Final    Comment: Enterobacterales represent a large order of gram negative bacteria, not a single organism. CRITICAL RESULT CALLED TO, READ BACK BY AND VERIFIED WITH: Mercer Pod [id number] @ 2221 FH    Enterobacter cloacae complex NOT DETECTED NOT DETECTED Final   Escherichia coli NOT DETECTED NOT DETECTED Final   Klebsiella aerogenes NOT DETECTED NOT DETECTED Final   Klebsiella oxytoca NOT DETECTED NOT DETECTED Final   Klebsiella pneumoniae NOT DETECTED NOT DETECTED Final   Proteus species DETECTED (A) NOT DETECTED Final    Comment: CRITICAL RESULT CALLED TO, READ BACK BY AND VERIFIED WITH: Mercer Pod [id number] @ 2221 FH    Salmonella species NOT DETECTED NOT DETECTED Final   Serratia marcescens NOT DETECTED NOT DETECTED Final   Haemophilus influenzae NOT DETECTED NOT DETECTED Final   Neisseria meningitidis NOT DETECTED NOT DETECTED Final   Pseudomonas aeruginosa  NOT DETECTED NOT DETECTED Final   Stenotrophomonas maltophilia NOT DETECTED NOT DETECTED Final   Candida albicans NOT DETECTED NOT DETECTED Final   Candida auris NOT DETECTED NOT DETECTED Final   Candida glabrata NOT DETECTED NOT DETECTED Final   Candida krusei NOT DETECTED NOT DETECTED Final   Candida parapsilosis NOT DETECTED NOT DETECTED Final   Candida tropicalis NOT DETECTED NOT DETECTED Final   Cryptococcus neoformans/gattii NOT DETECTED NOT DETECTED Final   CTX-M ESBL NOT DETECTED NOT DETECTED Final   Carbapenem resistance IMP NOT DETECTED NOT DETECTED Final   Carbapenem resistance KPC NOT DETECTED NOT DETECTED Final   Carbapenem resistance NDM NOT DETECTED NOT DETECTED Final   Carbapenem resist OXA 48 LIKE NOT DETECTED NOT DETECTED Final   Carbapenem resistance VIM NOT DETECTED NOT DETECTED Final    Comment: Performed at [facility], [address] [place], [place] [address]  Culture, blood (Routine X 2) w Reflex to ID Panel     Status: None   Collection Time: 07/26/22  5:55 PM   Specimen: BLOOD  Result Value Ref Range Status   Specimen Description BLOOD LEFT ANTECUBITAL  Final   Special Requests   Final    BOTTLES DRAWN AEROBIC AND ANAEROBIC Blood Culture adequate volume   Culture   Final    NO GROWTH 5 DAYS Performed at [facility], [address] [place], [place] [address]    Report Status 07/31/2022 FINAL  Final  Culture, blood (Routine X 2) w Reflex to ID Panel     Status: None   Collection  Time: 07/26/22  6:05 PM   Specimen: BLOOD LEFT HAND  Result Value Ref Range Status   Specimen Description BLOOD LEFT HAND  Final   Special Requests   Final    BOTTLES DRAWN AEROBIC AND ANAEROBIC Blood Culture adequate volume   Culture   Final    NO GROWTH 5 DAYS Performed at [facility], [address], [place], [place] [address]    Report Status 07/31/2022 FINAL  Final  SARS Coronavirus 2 by RT PCR (hospital order, performed in [facility] hospital lab)  *cepheid single result test* Anterior Nasal Swab     Status: None   Collection Time: 07/29/22  8:55 AM   Specimen: Anterior Nasal Swab  Result Value Ref Range Status   SARS Coronavirus 2 by RT PCR NEGATIVE NEGATIVE Final    Comment: Performed at [facility], [address] [place], [place] [address]  Culture, blood (Routine X 2) w Reflex to ID Panel     Status: None   Collection Time: 08/02/22  5:38 PM   Specimen: BLOOD  Result Value Ref Range Status   Specimen Description BLOOD PICC LINE  Final   Special Requests   Final    BOTTLES DRAWN AEROBIC AND ANAEROBIC Blood Culture adequate volume   Culture   Final    NO GROWTH 5 DAYS Performed at [facility], [address] [place], [place] [address]    Report Status 08/07/2022 FINAL  Final  Culture, blood (Routine X 2) w Reflex to ID Panel     Status: None   Collection Time: 08/02/22  5:53 PM   Specimen: BLOOD  Result Value Ref Range Status   Specimen Description BLOOD PICC LINE  Final   Special Requests   Final    BOTTLES DRAWN AEROBIC AND ANAEROBIC Blood Culture results may not be optimal due to an excessive volume of blood received in culture bottles   Culture   Final    NO GROWTH 5 DAYS Performed at [facility], [address], [place], [place] [address]    Report Status 08/07/2022 FINAL  Final    Studies/Results: No results found.  7/13 mri pelvis 1. Chronic osteomyelitis with bony destruction of both proximal femurs, the acetabula, and inferior pubic rami. 2. Large decubitus ulcers extend through the gluteal musculature to the cutaneous surface is posterior to the hip joints. There is tracking gas and fluid within the resulting cavities. 3. Absence of posterior sacrum possibly from bony destruction or spina bifida. Posterior to the sacrum a 6.7 by 2.9 by 4.6 cm complex masslike appearance is again observed. 4. Substantial edema and enhancement in the subcutaneous tissues lateral to both hips, right  greater than left, compatible with cellulitis. Cellulitis tracks down in both upper extremities especially anteromedially. 5. There is edema and swelling of the scrotum and scrotal cellulitis is not excluded. 6. Severe fatty atrophy of the upper leg musculature compatible with paralysis.   7/15 tee No evidence of vegetation/infective  endocarditis on this transesophageael echocardiogram.    7/6 tte  1. No evidence of SBE or obvious vegetations.   2. Left ventricular ejection fraction, by estimation, is 55%. The left ventricle has normal function. The left ventricle has no regional wall motion abnormalities. Left ventricular diastolic parameters were normal.   3. Right ventricular systolic function is normal. The right ventricular size is normal.   4. Left atrial size was mildly dilated.   5. The mitral valve is  abnormal. Trivial mitral valve regurgitation. No evidence of mitral stenosis.   6. The aortic valve is tricuspid. There is mild calcification of the aortic valve. Aortic valve regurgitation is not visualized. Aortic valve sclerosis is present, with no evidence of aortic valve stenosis.   7. The inferior vena cava is normal in size with greater than 50% respiratory variability, suggesting right atrial pressure of 3 mmHg.   7/5 ct abd pelv 1. Bilateral decubitus ulceration overlying the posterior aspect of both hips and bilateral ischium. There is gas and fluid noted in the soft tissues surrounding the hips as well as within the hip joints bilaterally. Findings are concerning for septic arthritis. 2. Soft tissue ulceration, subcutaneous gas and soft tissue thickening along the left gluteal fold towards the base of penis. 3. Signs of chronic osteomyelitis with increased sclerosis and bony destruction within bilateral pubic rami. 4. Splenomegaly. 5. Three large calcifications within the lumen of the urinary bladder which measure up to 3.6 cm. Unchanged from previous exam. 6. Stable  retroperitoneal and bilateral inguinal adenopathy. 7. Malrotation variant of the bowel. No evidence of bowel obstruction. 8. Fat containing right inguinal hernia.  Assessment/Plan:  Abx: 7/16-c piptazo 7/05-c Daptomycin  7/05-15 Cefepime 7/05-15 metronidazole  Principal Problem:   Polymicrobial bacterial infection Active Problems:   Acute Right shoulder pain   Decubitus ulcer due to spina bifida (HCC)   Anemia of infection and chronic disease   Acute deep vein thrombosis (DVT) of axillary vein of right upper extremity (HCC)    Anthony [name] is a 52 y.o. male with spina bifida paraplegia chronic decubitus ulcers, chronic left septic arthritis osteomyelitis status post multiple I&D's by orthopedic surgery, multiple cultures including bone cultures growing Enterococcus faecalis and Bacteroides bacteremia with streptococcus anginosus, mitral valve endocarditis presumed to be due to Enterococcus status post ceftriaxone and high-dose ampicillin, readmitted 7/05 for sepsis in setting worsening sacra wound with bone/joint involvement both femoral joint processes, and complicated by proteus and e faecalis bacteremia and also GPR on stain    #Polymicrobial bacteremia with sepsis: #sacral ulcer associated infection Continue dapto/piptazo The gram positive rod is not able to be grown due to over growth by proteus. Regardless should be covered by current abx regimen  Tte no obvious new vegetation. [name] also negative for vegetation Source sacral ulcer -- 7/13 mri extensive bone/joint/soft tissue involvement. No frank abscess  S/p colostomy 7/23  Ortho to reevaluate and see if any I&D can be done Hydrotherapy also discussed with team   -continue abx  -he has had extensive course and really has no change in fever curve. Await ortho repeat evaluation.  -please discuss hydrotherapy with patient as well -await further decision before transitioning to oral abx for another 10-14 days of no  surgery needed   I spent more than 50 minute reviewing data/chart, and coordinating care, providing direct face to face time providing counseling/discussing diagnostics/treatment plan with patient and treatment team    LOS: 18 days   Anthony [name] Anthony [name] 08/13/2022, 12:04 PM

## 2022-08-14 ENCOUNTER — Other Ambulatory Visit (HOSPITAL_COMMUNITY): Payer: Self-pay

## 2022-08-14 ENCOUNTER — Inpatient Hospital Stay (HOSPITAL_COMMUNITY): Payer: Medicare HMO

## 2022-08-14 DIAGNOSIS — R112 Nausea with vomiting, unspecified: Secondary | ICD-10-CM | POA: Diagnosis not present

## 2022-08-14 DIAGNOSIS — A499 Bacterial infection, unspecified: Secondary | ICD-10-CM | POA: Diagnosis not present

## 2022-08-14 LAB — CBC
HCT: 26.4 % — ABNORMAL LOW (ref 39.0–52.0)
Hemoglobin: 7.6 g/dL — ABNORMAL LOW (ref 13.0–17.0)
MCH: 21.9 pg — ABNORMAL LOW (ref 26.0–34.0)
MCV: 76.1 fL — ABNORMAL LOW (ref 80.0–100.0)
Platelets: 890 10*3/uL — ABNORMAL HIGH (ref 150–400)
RBC: 3.47 MIL/uL — ABNORMAL LOW (ref 4.22–5.81)
RDW: 23 % — ABNORMAL HIGH (ref 11.5–15.5)
WBC: 11.4 10*3/uL — ABNORMAL HIGH (ref 4.0–10.5)
nRBC: 0 % (ref 0.0–0.2)

## 2022-08-14 MED ORDER — CIPROFLOXACIN HCL 500 MG PO TABS
750.0000 mg | ORAL_TABLET | Freq: Two times a day (BID) | ORAL | Status: DC
  Administered 2022-08-15 – 2022-08-17 (×6): 750 mg via ORAL
  Filled 2022-08-14 (×6): qty 2

## 2022-08-14 MED ORDER — POLYETHYLENE GLYCOL 3350 17 G PO PACK
17.0000 g | PACK | Freq: Two times a day (BID) | ORAL | Status: DC
  Administered 2022-08-14 – 2022-08-15 (×3): 17 g via ORAL
  Filled 2022-08-14 (×3): qty 1

## 2022-08-14 MED ORDER — SENNA 8.6 MG PO TABS
1.0000 | ORAL_TABLET | Freq: Every day | ORAL | Status: DC
  Administered 2022-08-15 – 2022-09-01 (×18): 8.6 mg via ORAL
  Filled 2022-08-14 (×18): qty 1

## 2022-08-14 MED ORDER — DOXYCYCLINE HYCLATE 100 MG PO TABS
100.0000 mg | ORAL_TABLET | Freq: Two times a day (BID) | ORAL | Status: DC
  Administered 2022-08-14 – 2022-08-17 (×8): 100 mg via ORAL
  Filled 2022-08-14 (×8): qty 1

## 2022-08-14 MED ORDER — APIXABAN 5 MG PO TABS
5.0000 mg | ORAL_TABLET | Freq: Two times a day (BID) | ORAL | Status: DC
  Administered 2022-08-14 – 2022-08-18 (×8): 5 mg via ORAL
  Filled 2022-08-14 (×8): qty 1

## 2022-08-14 MED ORDER — AMOXICILLIN-POT CLAVULANATE 875-125 MG PO TABS
1.0000 | ORAL_TABLET | Freq: Two times a day (BID) | ORAL | Status: DC
  Administered 2022-08-14: 1 via ORAL
  Filled 2022-08-14: qty 1

## 2022-08-14 MED ORDER — IRON SUCROSE 500 MG IVPB - SIMPLE MED
500.0000 mg | Freq: Once | INTRAVENOUS | Status: AC
  Administered 2022-08-14: 500 mg via INTRAVENOUS
  Filled 2022-08-14: qty 275

## 2022-08-14 MED ORDER — SIMETHICONE 80 MG PO CHEW
80.0000 mg | CHEWABLE_TABLET | Freq: Four times a day (QID) | ORAL | Status: DC | PRN
  Administered 2022-08-25 – 2022-08-29 (×2): 80 mg via ORAL
  Filled 2022-08-14 (×2): qty 1

## 2022-08-14 NOTE — Assessment & Plan Note (Signed)
Will continue with aggressive wound care and IV antibiotics and transition to PO abx possibly after determining if Ortho would like to proceed with any further intervention at this time. - Appreciate surgery recs - Wound care on board - ID on board

## 2022-08-14 NOTE — Assessment & Plan Note (Signed)
Has received 3 units pRBCS during this admission. Stable today. Hgb 7.6 this AM. - Requires blood from [facility] (2 units in house). Type and Screen done 7/22. - Daily CBC

## 2022-08-14 NOTE — Discharge Instructions (Addendum)
Dear Anthony [name],   Thank you so much for allowing [place] to be part of your care!  You were admitted to [facility] for infection of your chronic hip wounds and treated with a long course of antibiotics.   DISCHARGE MEDICATION CHANGES Daptomycin 600 mg IV daily until 9/12, then remove PICC line Fluconazole 400 mg daily until 9/12  POST-HOSPITAL & CARE INSTRUCTIONS You will discuss electric wheelchair orders with your PCP You will need follow up with ortho outpatient to talk about your wounds and tissue grafting Wound care instructions: Heels  1. Clean R heel wound with Vashe wound cleanser or other cleanser and cut silver hydrofiber to fit and apply to wound bed daily, cover with dry gauze and silicone foam. 2.  Wash bilateral feet and in between toes with soap and water.  Dry thoroughly. Apply Eucerin cream to bilateral feet and top of toes twice daily.  DO NOT PLACE BETWEEN TOES.  Hips & Sacrum  Cleanse wounds with VASHE or other antiseptic, pack wound (sacral, ischial) with Vashe moist gauze packing, top with abdominal pads, secure with tape of mesh underwear if patient prefers. Change three times daily. Please let PCP/Specialists know of any changes that were made.  Please see medications section of this packet for any medication changes.   DOCTOR'S APPOINTMENT & FOLLOW UP CARE INSTRUCTIONS  Please call the [facility] to schedule a follow up appointment when you are leaving your SNF.   RETURN PRECAUTIONS: Please return if you have frequent fevers, pain or significant drainage from your wounds, serious redness or swelling of your wounds, or any change in your ability to think or understand where you are/confusion.  Take care and be well!  Family Medicine Teaching Service Inpatient Team [facility]  [address] [place], [place] [address] [phone]    Information on my medicine - ELIQUIS (apixaban)  This medication education was reviewed with me or  my healthcare representative as part of my discharge preparation.    Why was Eliquis prescribed for you? Eliquis was prescribed to treat blood clots that may have been found in the veins of your legs (deep vein thrombosis) or in your lungs (pulmonary embolism) and to reduce the risk of them occurring again.  What do You need to know about Eliquis ? The dose is ONE 5 mg tablet taken TWICE daily.  Eliquis may be taken with or without food.   Try to take the dose about the same time in the morning and in the evening. If you have difficulty swallowing the tablet whole please discuss with your pharmacist how to take the medication safely.  Take Eliquis exactly as prescribed and DO NOT stop taking Eliquis without talking to the doctor who prescribed the medication.  Stopping may increase your risk of developing a new blood clot.  Refill your prescription before you run out.  After discharge, you should have regular check-up appointments with your healthcare provider that is prescribing your Eliquis.    What do you do if you miss a dose? If a dose of ELIQUIS is not taken at the scheduled time, take it as soon as possible on the same day and twice-daily administration should be resumed. The dose should not be doubled to make up for a missed dose.  Important Safety Information A possible side effect of Eliquis is bleeding. You should call your healthcare provider right away if you experience any of the following: Bleeding from an injury or  your nose that does not stop. Unusual colored urine (red or dark Heacox) or unusual colored stools (red or black). Unusual bruising for unknown reasons. A serious fall or if you hit your head (even if there is no bleeding).  Some medicines may interact with Eliquis and might increase your risk of bleeding or clotting while on Eliquis. To help avoid this, consult your healthcare provider or pharmacist prior to using any new prescription or non-prescription  medications, including herbals, vitamins, non-steroidal anti-inflammatory drugs (NSAIDs) and supplements.  This website has more information on Eliquis (apixaban): http://www.eliquis.com/eliquis/home

## 2022-08-14 NOTE — Progress Notes (Signed)
2 Days Post-Op   Subjective/Chief Complaint: Tolerating fld without n/v, burping, belching, or bloating/distension. Some gas pains overnight but no abdominal pain. Passing flatus via ostomy and had to burp his ostomy bag several times in the last 24 hours. Some sweat in ostomy bag - no stool.   Afebrile. No tachycardia or hypotension. WBC stable at 11.4. Hgb stable at 7.6  Objective: Vital signs in last 24 hours: Temp:  [98.1 F (36.7 C)-98.9 F (37.2 C)] 98.3 F (36.8 C) (07/25 0833) Pulse Rate:  [94-108] 94 (07/25 0405) Resp:  [13-20] 13 (07/25 0833) BP: (107-133)/(61-76) 112/63 (07/25 0833) SpO2:  [90 %-95 %] 95 % (07/25 0405) Weight:  [76.7 kg] 76.7 kg (07/25 0405) Last BM Date : 08/13/22  Intake/Output from previous day: 07/24 0701 - 07/25 0700 In: -  Out: 1700 [Urine:1700] Intake/Output this shift: No intake/output data recorded. Physical Exam: Abd: Soft, no distension, appropriately tender around laparoscopic incisions, no rigidity or guarding and otherwise NT, +BS. Incisions with glue intact appears well and are without drainage, bleeding, or signs of infection. Stoma pink, budded and viable - red rubber bridge in place. Ostomy bag with some air and sweat - no stool.   Lab Results:  Recent Labs    08/13/22 0700 08/14/22 0530  WBC 11.8* 11.4*  HGB 7.4* 7.6*  HCT 25.5* 26.4*  PLT 890* 890*   BMET No results for input(s): "NA", "K", "CL", "CO2", "GLUCOSE", "BUN", "CREATININE", "CALCIUM" in the last 72 hours.  PT/INR No results for input(s): "LABPROT", "INR" in the last 72 hours. ABG No results for input(s): "PHART", "HCO3" in the last 72 hours.  Invalid input(s): "PCO2", "PO2"  Studies/Results: No results found.  Anti-infectives: Anti-infectives (From admission, onward)    Start     Dose/Rate Route Frequency Ordered Stop   08/05/22 1500  piperacillin-tazobactam (ZOSYN) IVPB 3.375 g  Status:  Discontinued        3.375 g 100 mL/hr over 30 Minutes  Intravenous Every 8 hours 08/05/22 0943 08/05/22 0944   08/05/22 1500  piperacillin-tazobactam (ZOSYN) IVPB 3.375 g        3.375 g 12.5 mL/hr over 240 Minutes Intravenous Every 8 hours 08/05/22 0945     07/28/22 2200  metroNIDAZOLE (FLAGYL) tablet 500 mg  Status:  Discontinued        500 mg Oral Every 12 hours 07/28/22 1412 08/05/22 0943   07/26/22 1800  ceFEPIme (MAXIPIME) 2 g in sodium chloride 0.9 % 100 mL IVPB  Status:  Discontinued        2 g 200 mL/hr over 30 Minutes Intravenous Every 8 hours 07/26/22 1745 08/05/22 0943   07/25/22 2100  ceFEPIme (MAXIPIME) 2 g in sodium chloride 0.9 % 100 mL IVPB  Status:  Discontinued        2 g 200 mL/hr over 30 Minutes Intravenous Every 12 hours 07/25/22 0926 07/26/22 1745   07/25/22 2100  metroNIDAZOLE (FLAGYL) IVPB 500 mg  Status:  Discontinued        500 mg 100 mL/hr over 60 Minutes Intravenous Every 12 hours 07/25/22 1900 07/28/22 1412   07/25/22 1700  DAPTOmycin (CUBICIN) 650 mg in sodium chloride 0.9 % IVPB        8 mg/kg  79.4 kg 126 mL/hr over 30 Minutes Intravenous Daily 07/25/22 1546     07/25/22 0800  ceFEPIme (MAXIPIME) 2 g in sodium chloride 0.9 % 100 mL IVPB        2 g 200 mL/hr over 30 Minutes  Intravenous  Once 07/25/22 0758 07/25/22 0942   07/25/22 0800  metroNIDAZOLE (FLAGYL) IVPB 500 mg        500 mg 100 mL/hr over 60 Minutes Intravenous  Once 07/25/22 0758 07/25/22 1159       Assessment/Plan: POD 2 lap loop sigmoid colostomy by Dr. Dwain Sarna on 7/23 for Spina bfida with paraplegia and chronic sacral ulcers and osteomyelitis  - Adv to soft diet. Add simethicone - WOCN consult for new ostomy - Continue ostomy bridge - this can be removed around 10 days postop - Would place on bowel regimen long term  FEN - Soft diet VTE - SCDs, can do lovenox ID - On Daptomycin and Zosyn per ID Foley - SP tube (chronic)      Anthony [name] [facility] 08/14/2022

## 2022-08-14 NOTE — Progress Notes (Signed)
Occupational Therapy Treatment Patient Details Name: Anthony [name] MRN: [id number] DOB: 05-Feb-1970 Today's Date: 08/14/2022   History of present illness Pt is a 52 y.o. male who presented 07/25/22 with weakness and a fall. Pt admitted with severe sepsis secondary to sacral decubitus ulcer infection with concern for hip septic arthritis noted on CT. Imaging of R shoulder showed moderate acromioclavicular OA & mild distal lasteral subacromial spurring. S/p colostomy 7/23. PMH: complete paraplegia, multiple toe amputations, chronic sacral wounds, neurogenic bladder with suprapubic catheter, spina bifida, HTN   OT comments  Pt premedicated for pain prior to session in hopes of pt sitting EOB or transferring to chair. Pt reports having vomited just prior to OT's arrival. Limited session to R shoulder A/AAROM. Only able to FF and abduct actively to 30 degrees  today. Instructed pt to continue supine self ROM exercises, verbalized understanding.    Recommendations for follow up therapy are one component of a multi-disciplinary discharge planning process, led by the attending physician.  Recommendations may be updated based on patient status, additional functional criteria and insurance authorization.    Assistance Recommended at Discharge Frequent or constant Supervision/Assistance  Patient can return home with the following  A lot of help with bathing/dressing/bathroom;Assistance with cooking/housework;Assistance with feeding;Direct supervision/assist for medications management;Assist for transportation;Help with stairs or ramp for entrance;A little help with walking and/or transfers   Equipment Recommendations  Hospital bed    Recommendations for Other Services      Precautions / Restrictions Precautions Precautions: Fall;Other (comment) Precaution Comments: paraplegia Restrictions Weight Bearing Restrictions: No       Mobility Bed Mobility                    Transfers                          Balance                                           ADL either performed or assessed with clinical judgement   ADL                                              Extremity/Trunk Assessment Upper Extremity Assessment RUE Deficits / Details: AAROM FF, ABD full range x 10, AROM 45 degrees FF and ABD            Vision       Perception     Praxis      Cognition Arousal/Alertness: Awake/alert Behavior During Therapy: WFL for tasks assessed/performed Overall Cognitive Status: Within Functional Limits for tasks assessed                                          Exercises      Shoulder Instructions       General Comments      Pertinent Vitals/ Pain       Pain Assessment Pain Assessment: Faces Faces Pain Scale: Hurts little more Pain Location: abdomen Pain Descriptors / Indicators: Discomfort, Grimacing, Guarding  Home Living  Prior Functioning/Environment              Frequency  Min 1X/week        Progress Toward Goals  OT Goals(current goals can now be found in the care plan section)  Progress towards OT goals: Not progressing toward goals - comment  Acute Rehab OT Goals OT Goal Formulation: With patient Time For Goal Achievement: 08/27/22 Potential to Achieve Goals: Good  Plan Discharge plan remains appropriate    Co-evaluation                 AM-PAC OT "6 Clicks" Daily Activity     Outcome Measure   Help from another person eating meals?: None Help from another person taking care of personal grooming?: A Little Help from another person toileting, which includes using toliet, bedpan, or urinal?: Total Help from another person bathing (including washing, rinsing, drying)?: A Lot Help from another person to put on and taking off regular upper body clothing?: A Lot Help from another person to put on and  taking off regular lower body clothing?: Total 6 Click Score: 13    End of Session    OT Visit Diagnosis: Muscle weakness (generalized) (M62.81);Pain   Activity Tolerance Treatment limited secondary to medical complications (Comment) (vomited prior to arrival)   Patient Left in bed;with call bell/phone within reach   Nurse Communication          Time: 1610-9604 OT Time Calculation (min): 21 min  Charges: OT General Charges $OT Visit: 1 Visit OT Treatments $Therapeutic Exercise: 8-22 mins  Berna Spare, OTR/L Acute Rehabilitation Services Office: [phone]   Evern Bio 08/14/2022, 12:53 PM

## 2022-08-14 NOTE — Consult Note (Signed)
WOC consulted for new ostomy, was marked preoperatively by the [facility] nursing team and seen for first post op visit 7/25, team will follow up with teaching accordingly.   Nevan Creighton [facility], CNS, [employer] [phone]

## 2022-08-14 NOTE — Assessment & Plan Note (Signed)
XR negative for fracture. - PT following - Multimodal pain management - Roxabin, Voltaren, Lidoderm and PRN Tylenol - Consider R shoulder steroid injection in the future

## 2022-08-14 NOTE — Assessment & Plan Note (Addendum)
Polymicrobial bacterial infection - IV Cefepime (7/5 - 7/16) & Flagyl (7/5 -7/16 ) completed. Dr. [name] 7/15 thinks there is stool in his wounds. General surgery performed colostomy 7/24.  Dr. [name] agreed that hydrotherapy would be a better therapeutic option at this time vs surgical debridement. Discontinued IV Daptomycin (7/5-7/25) and Zosyn (7/18-7/25). - Diverting colostomy: stoma clean, pink, with small amount of serosanguinous drainage in ostomy bag, slightly tender around stoma - Transition IV abx to PO Augmentin and Doxycyline for 14 days (7/25-8/7) - Consulted PT for hydrotherapy for debridement purposes - Lovenox restarted s/p surgery, will transition to Eliquis for 3 months - BCID: strep spp + proteus, no growth - Senna daily and Miralax/Glycolax BID on board for aggressive bowel regimen s/p colostomy - Oxycodone 10 mg q4h PRN + Dilaudid 0.5 mg q2h PRN for pain - TOC consulted for SNF placement

## 2022-08-14 NOTE — Assessment & Plan Note (Addendum)
Patient vomited today. Will proceed with KUB to rule out ileus vs obstruction post colostomy. - KUB XR pending

## 2022-08-14 NOTE — Assessment & Plan Note (Addendum)
DVT noted on R upper extremity. Patient R arm is not swollen and nontender. - Lovenox restarted s/p surgery, will transition to Eliquis for 3 months - Continue to monitor

## 2022-08-14 NOTE — TOC Benefit Eligibility Note (Signed)
Pharmacy Patient Advocate Encounter  Insurance verification completed.    The patient is insured through [employer] MEDICA[employer]   Ran test claim for Eliquis 5 mg and the current 30 day co-pay is $4.00.   This test claim was processed through [facility]- copay amounts may vary at other pharmacies due to pharmacy/plan contracts, or as the patient moves through the different stages of their insurance plan.    Roland Earl, CPHT Pharmacy Patient Advocate Specialist [facility] Patient Advocate Team Direct Number: [phone]  Fax: [phone]

## 2022-08-14 NOTE — Progress Notes (Signed)
Assessed patient at bedside with Dr. Mliss Sax. KUB obtained earlier in the day due to an episode of emesis which he attributed to drinking his MiraLax too fast. KUB showed small and large bowel dilatation concerning for possible ileus.   He denies any pain or discomfort at present. Had actually just eaten a bowl of macaroni and cheese with no discomfort. Is looking forward to banana pudding for dessert.   On exam his abdomen is soft, mildly distended and perhaps somewhat hypertympanic but he is without rebound or guarding. Bowel sounds are vigorous. Colostomy bag in place. Trace fluid colostomy output but no stool.   Post-op day 2 s/p lap loop sigmoid colostomy - No colostomy output as of yet - Emesis x1 raising suspicion for ileus with bowel dilatation on imaging - However, he feels well and is eating without issue at this time - No changes at present, but will have our night team see him this evening for a follow-up abdominal exam - Aggressive bowel regimen with MiraLAX and Senna, also recently started Augmentin which may drive some output   Anthony Mccoy, MD 08/14/22 5:55 PM

## 2022-08-14 NOTE — Progress Notes (Addendum)
Daily Progress Note Intern Pager: [phone]  Patient name: Anthony [name] Medical record number: [id number] Date of birth: 1970/10/04 Age: 52 y.o. Gender: male  Primary Care Provider: Erick Alley, DO Consultants: ID, Cardiology, Ortho, General Surgery  Code Status: Full Code  Pt Overview and Major Events to Date:  7/5: Admitted 7/6: Ortho consulted, stated no acute intervention indicated. ID consulted, recommended BSA bx 7/7: Hg dropped and patient received 1x pRBCs  7/8: Hg dropped again, 1x pRBCs, [name] ordered per ID recs  7/9: replaced catheter 7/12: received 1x pRBC 7/15: [name] - LVEF 55-60%, normal function 7/19: Surgery cancelled due to IT issues/outage 7/23: Diverting colostomy  Assessment and Plan: Anthony [name] Anthony [name] is a 52 yo M with PMHx of spina bfida with paraplegia and chronic sacral ulcers admitted for septic shock 2/2 to infected sacral ulcers and osteomyelitis as supported by CT and MRI.   [facility]     * (Principal) Polymicrobial bacterial infection     Polymicrobial bacterial infection - IV Cefepime (7/5 - 7/16) & Flagyl  (7/5 -7/16 ) completed. Dr. [name] 7/15 thinks there is stool in his wounds.  General surgery performed colostomy 7/24.  Dr. [name] agreed that  hydrotherapy would be a better therapeutic option at this time vs surgical  debridement. Discontinued IV Daptomycin (7/5-7/25) and Zosyn (7/18-7/25). - Diverting colostomy: stoma clean, pink, with small amount of  serosanguinous drainage in ostomy bag, slightly tender around stoma - Transition IV abx to PO Augmentin and Doxycyline for 14 days (7/25-8/7) - Consulted PT for hydrotherapy for debridement purposes - Lovenox restarted s/p surgery, will transition to Eliquis for 3 months - BCID: strep spp + proteus, no growth - Senna daily and Miralax/Glycolax BID on board for aggressive bowel  regimen s/p colostomy - Oxycodone 10 mg q4h PRN + Dilaudid 0.5 mg q2h PRN for pain - TOC consulted for SNF  placement        Acute Right shoulder pain     XR negative for fracture. - PT following - Multimodal pain management - Roxabin, Voltaren, Lidoderm and PRN Tylenol - Consider R shoulder steroid injection in the future        Decubitus ulcer due to spina bifida (HCC)     Will continue with aggressive wound care and IV antibiotics and  transition to PO abx possibly after determining if Ortho would like to  proceed with any further intervention at this time. - Appreciate surgery recs - Wound care on board - ID on board         Anemia of infection and chronic disease     Has received 3 units pRBCS during this admission. Stable today. Hgb 7.6  this AM. - Requires blood from [place] (2 units in house). Type and Screen done  7/22. - Daily CBC  - IV Venofer        Acute deep vein thrombosis (DVT) of axillary vein of right upper  extremity (HCC)     DVT noted on R upper extremity. Patient R arm is not swollen and  nontender. - Lovenox restarted s/p surgery, will transition to Eliquis for 3 months - Continue to monitor        Nausea & vomiting     Patient vomited today. Will proceed with KUB to rule out ileus vs  obstruction post colostomy. - KUB XR pending      FEN/GI: Full Liquid Diet PPx: Lovenox Dispo: Pending clinical improvement  Subjective:  Patient is doing well this morning, but had significant pain overnight. Says that his pain is minimal this morning, but has some over periumbilical region along with around his stoma. Notes that his right shoulder is improving.   Objective: Temp:  [98.3 F (36.8 C)-98.9 F (37.2 C)] 98.7 F (37.1 C) (07/25 1247) Pulse Rate:  [94-108] 103 (07/25 1247) Resp:  [13-20] 13 (07/25 0833) BP: (106-133)/(61-76) 106/73 (07/25 1247) SpO2:  [93 %-100 %] 100 % (07/25 1247) Weight:  [76.7 kg] 76.7 kg (07/25 0405) Physical Exam: General: NAD, awake and alert, laying comfortably in bed Cardiovascular: RRR. No M/R/G Respiratory:  CTAB.  Normal work of breathing on RA Abdomen: Soft, non-tender, non-distended. Normoactive bowel sounds. Stoma clean, pink, with small amount of serosanguinous drainage in ostomy bag, slightly tender around stoma  Laboratory: Most recent CBC Lab Results  Component Value Date   WBC 11.4 (H) 08/14/2022   HGB 7.6 (L) 08/14/2022   HCT 26.4 (L) 08/14/2022   MCV 76.1 (L) 08/14/2022   PLT 890 (H) 08/14/2022   Most recent BMP    Latest Ref Rng & Units 08/11/2022    2:03 AM  BMP  Glucose 70 - 99 mg/dL 161   BUN 6 - 20 mg/dL 9   Creatinine 0.96 - 0.45 mg/dL 4.09   Sodium 811 - 914 mmol/L 136   Potassium 3.5 - 5.1 mmol/L 3.8   Chloride 98 - 111 mmol/L 101   CO2 22 - 32 mmol/L 26   Calcium 8.9 - 10.3 mg/dL 8.8     Imaging/Diagnostic Tests: No new imaging.   [name], DO 08/14/2022, 1:20 PM PGY-1, [facility] Family Medicine FPTS Intern pager: [phone], text pages welcome Secure chat group [facility] Teaching Service

## 2022-08-14 NOTE — Assessment & Plan Note (Addendum)
Has received 3 units pRBCS during this admission. Stable today. Hgb 7.6 this AM. S/p IV Venofer 7/25. - Requires blood from [place] (2 units in house). Type and Screen done 7/22. - AM CBC

## 2022-08-14 NOTE — Progress Notes (Signed)
FMTS Interim Progress Note  S: At bedside with Dr. Laroy Apple for abdominal exam. Patient reports he is feeling pretty good. He reports no discomfort and no more episodes of vomiting. He states he was able to eat some mac and cheese and some banana pudding.   O: BP 123/72 (BP Location: Left Leg)   Pulse (!) 108   Temp 99.4 F (37.4 C) (Oral)   Resp 20   Ht 5\' 8"  (1.727 m)   Wt 76.7 kg   SpO2 94%   BMI 25.70 kg/m    General: NAD, well appearing, pleasant  GI: soft, mildly distended with TTP at periumbilical and LLQ, TTP around surgical site, active bowel sounds  Colostomy site clean and dressed, some Maher, liquid output but no stool.   A/P:  S/p looping sigmoid colostomy - day 2 Patient reports no abdominal pain or N/V. Reports he has a good appetite. Some Marcella, liquid output in colostomy bag. KUB showed gaseous distention in large an small bowel loops - No changes at present - continue bowel regimen with miralax and senna - consider repeat KUB in AM if patient complains of increased pain or more episodes of N/V   Plan for other problems per day team note.   Georg Ruddle Rahi Chandonnet, MD 08/14/2022, 8:43 PM PGY-1, [facility] Family Medicine Service pager [phone]

## 2022-08-15 DIAGNOSIS — A499 Bacterial infection, unspecified: Secondary | ICD-10-CM | POA: Diagnosis not present

## 2022-08-15 LAB — COMPREHENSIVE METABOLIC PANEL: BUN: 10 mg/dL (ref 6–20)

## 2022-08-15 LAB — MAGNESIUM: Magnesium: 1.8 mg/dL (ref 1.7–2.4)

## 2022-08-15 LAB — CBC: RBC: 3.43 MIL/uL — ABNORMAL LOW (ref 4.22–5.81)

## 2022-08-15 MED ORDER — GABAPENTIN 100 MG PO CAPS
100.0000 mg | ORAL_CAPSULE | Freq: Three times a day (TID) | ORAL | Status: DC
  Administered 2022-08-15 – 2022-09-01 (×52): 100 mg via ORAL
  Filled 2022-08-15 (×52): qty 1

## 2022-08-15 MED ORDER — OXYCODONE HCL 5 MG PO TABS
5.0000 mg | ORAL_TABLET | ORAL | Status: DC | PRN
  Administered 2022-08-15: 7.5 mg via ORAL
  Administered 2022-08-15: 5 mg via ORAL
  Administered 2022-08-16: 7.5 mg via ORAL
  Filled 2022-08-15 (×2): qty 2
  Filled 2022-08-15: qty 1

## 2022-08-15 MED ORDER — CHEWING GUM (ORBIT) SUGAR FREE
1.0000 | CHEWING_GUM | Freq: Four times a day (QID) | ORAL | Status: DC
  Administered 2022-08-15 – 2022-08-16 (×3): 1 via ORAL
  Filled 2022-08-15: qty 1

## 2022-08-15 NOTE — Assessment & Plan Note (Signed)
Transitioned to Augmentin and Doxycycline for 14 days (7/25-8/7).  Ortho and ID recommended hydrotherapy over surgical debridement. - Wound care on board - ID on board

## 2022-08-15 NOTE — Assessment & Plan Note (Addendum)
KUB showed gaseous distention in both large and small intestines and large bladder stones. Patient had a second episode of emesis today, GI notified and advised patient to be NPO except for sips with meds to further investigate. - Simethicone 80mg  q6h PRN - Compazine 10 mg q6h PRN

## 2022-08-15 NOTE — Assessment & Plan Note (Addendum)
Polymicrobial bacterial infection - IV Cefepime (7/5 - 7/16) & Flagyl (7/5 -7/16 ) completed. Dr. [name] 7/15 thinks there is stool in his wounds. General surgery performed colostomy 7/24.  Dr. [name] agreed that hydrotherapy would be a better therapeutic option at this time vs surgical debridement. Discontinued IV Daptomycin (7/5-7/25) and Zosyn (7/18-7/25). - Diverting colostomy: stoma clean, pink, with small amount of serosanguinous drainage in ostomy bag, slightly tender around stoma, no stool - Transition IV abx to PO Ciprofloxacin and Doxycyline for 10 days (7/25-8/3) - PM EKG - AM CBC, CMP - Replete Mag and K as indicated (Mg > 2, K > 4) - Consulted PT for hydrotherapy for debridement purposes - Lovenox restarted s/p surgery, will transition to Eliquis for 3 months - BCID: strep spp + proteus, no growth - Senna daily and Miralax/Glycolax BID on board for aggressive bowel regimen s/p colostomy - Oxycodone 5 mg q4h PRN and Gabapentin 100 mg TID for pain - Gum for gut motility - TOC consulted for SNF placement (may be an issue d/t some debt in the past)

## 2022-08-15 NOTE — Progress Notes (Signed)
At bedside ,to insert PIV and d/c PICC after ,as per IV Team consult .Pt refused the PIV and PICC to be d\c'd .Explained the importance and MD order for infection prevention but pt still refused, RN aware and will notify MD.

## 2022-08-15 NOTE — Progress Notes (Signed)
Id brief note  S/p colostomy 7/23 Fever curve seems to be improving since then but only 2-3 days  Transitioned to oral abx doxy/cipro 7/25  No surgical intervention planned    -finish 2 weeks oral abx on 8/3 -can continue to have wound care outpatient -consider hydrotherapy if ongoing fever here -no id clinic f/u needed -will sign off; please call if further question -discussed with primary team

## 2022-08-15 NOTE — Progress Notes (Signed)
3 Days Post-Op   Subjective/Chief Complaint: Patient with episode of n/v yesterday morning after I saw him. Our team was not alerted. Xray was obtained that showed gaseous distension of both large and small bowel loops. He has since tolerated soft diet (mac and cheese for dinner) without n/v. Some very infrequent hiccups but no burping/belching. Does not feel bloated/distended. Some pain around his ostomy but otherwise no abdominal pain. He continues to pass flatus via ostomy and had to burp his ostomy bag several times in the last 24 hours. Some sweat in ostomy bag - no stool.   Afebrile. Tachycardic this am. No hypotension. Hgb stable.   Objective: Vital signs in last 24 hours: Temp:  [98.2 F (36.8 C)-99.6 F (37.6 C)] 98.6 F (37 C) (07/26 0810) Pulse Rate:  [102-116] 106 (07/26 0810) Resp:  [13-23] 23 (07/26 0810) BP: (105-129)/(63-74) 115/70 (07/26 0810) SpO2:  [91 %-100 %] 94 % (07/26 0810) Weight:  [76.7 kg] 76.7 kg (07/26 0537) Last BM Date : 08/13/22  Intake/Output from previous day: 07/25 0701 - 07/26 0700 In: 490 [P.O.:490] Out: 1070 [Urine:1050; Stool:20] Intake/Output this shift: Total I/O In: -  Out: 150 [Urine:150] Physical Exam: Abd: Soft, mild distension, appropriately tender around laparoscopic incisions, no rigidity or guarding and otherwise NT, +BS. Incisions with glue intact appears well and are without drainage, bleeding, or signs of infection. Stoma pink, budded and viable - red rubber bridge in place. Ostomy bag with some air and sweat - no stool.   Lab Results:  Recent Labs    08/14/22 0530 08/15/22 0530  WBC 11.4* 14.8*  HGB 7.6* 7.6*  HCT 26.4* 25.8*  PLT 890* 861*   BMET Recent Labs    08/15/22 0530  NA 135  K 3.5  CL 96*  CO2 30  GLUCOSE 113*  BUN 10  CREATININE 0.45*  CALCIUM 8.9    PT/INR No results for input(s): "LABPROT", "INR" in the last 72 hours. ABG No results for input(s): "PHART", "HCO3" in the last 72 hours.  Invalid  input(s): "PCO2", "PO2"  Studies/Results: DG Abd 1 View  Result Date: 08/14/2022 CLINICAL DATA:  Emesis EXAM: ABDOMEN - 1 VIEW COMPARISON:  07/25/2022 FINDINGS: Gaseous distention of both large and small bowel loops within the abdomen. No gross free intraperitoneal air. Multiple large bladder stones. Decubitus ulcers and evidence of chronic osteomyelitis of both hips. IMPRESSION: 1. Gaseous distention of both large and small bowel loops within the abdomen, which may represent ileus. 2. Multiple large bladder stones. Electronically Signed   By: Duanne Guess D.O.   On: 08/14/2022 16:29    Anti-infectives: Anti-infectives (From admission, onward)    Start     Dose/Rate Route Frequency Ordered Stop   08/14/22 1500  ciprofloxacin (CIPRO) tablet 750 mg        750 mg Oral 2 times daily 08/14/22 1412 08/24/22 1959   08/14/22 1200  amoxicillin-clavulanate (AUGMENTIN) 875-125 MG per tablet 1 tablet  Status:  Discontinued        1 tablet Oral Every 12 hours 08/14/22 1101 08/14/22 1412   08/14/22 1200  doxycycline (VIBRA-TABS) tablet 100 mg        100 mg Oral Every 12 hours 08/14/22 1101 08/24/22 1410   08/05/22 1500  piperacillin-tazobactam (ZOSYN) IVPB 3.375 g  Status:  Discontinued        3.375 g 100 mL/hr over 30 Minutes Intravenous Every 8 hours 08/05/22 0943 08/05/22 0944   08/05/22 1500  piperacillin-tazobactam (ZOSYN) IVPB 3.375 g  Status:  Discontinued        3.375 g 12.5 mL/hr over 240 Minutes Intravenous Every 8 hours 08/05/22 0945 08/14/22 1101   07/28/22 2200  metroNIDAZOLE (FLAGYL) tablet 500 mg  Status:  Discontinued        500 mg Oral Every 12 hours 07/28/22 1412 08/05/22 0943   07/26/22 1800  ceFEPIme (MAXIPIME) 2 g in sodium chloride 0.9 % 100 mL IVPB  Status:  Discontinued        2 g 200 mL/hr over 30 Minutes Intravenous Every 8 hours 07/26/22 1745 08/05/22 0943   07/25/22 2100  ceFEPIme (MAXIPIME) 2 g in sodium chloride 0.9 % 100 mL IVPB  Status:  Discontinued        2 g 200  mL/hr over 30 Minutes Intravenous Every 12 hours 07/25/22 0926 07/26/22 1745   07/25/22 2100  metroNIDAZOLE (FLAGYL) IVPB 500 mg  Status:  Discontinued        500 mg 100 mL/hr over 60 Minutes Intravenous Every 12 hours 07/25/22 1900 07/28/22 1412   07/25/22 1700  DAPTOmycin (CUBICIN) 650 mg in sodium chloride 0.9 % IVPB  Status:  Discontinued        8 mg/kg  79.4 kg 126 mL/hr over 30 Minutes Intravenous Daily 07/25/22 1546 08/14/22 1101   07/25/22 0800  ceFEPIme (MAXIPIME) 2 g in sodium chloride 0.9 % 100 mL IVPB        2 g 200 mL/hr over 30 Minutes Intravenous  Once 07/25/22 0758 07/25/22 0942   07/25/22 0800  metroNIDAZOLE (FLAGYL) IVPB 500 mg        500 mg 100 mL/hr over 60 Minutes Intravenous  Once 07/25/22 0758 07/25/22 1159       Assessment/Plan: POD 3 lap loop sigmoid colostomy by Dr. Dwain Sarna on 7/23 for Spina bfida with paraplegia and chronic sacral ulcers and osteomyelitis  - Some n/v yesterday that has now resolved. Okay to continue soft diet. If he develops any further episodes of n/v would make him npo and let our team know.  - WOCN following for new ostomy - Continue ostomy bridge - this can be removed around 10 days postop - Would place on bowel regimen long term  FEN - Soft diet VTE - SCDs, Eliquis ID - On Daptomycin and Zosyn per ID Foley - SP tube (chronic)      Elmer Sow [facility] 08/15/2022

## 2022-08-15 NOTE — Assessment & Plan Note (Addendum)
XR negative for fracture. Improving with PT. - PT following - Multimodal pain management - Roxabin, Voltaren, Lidoderm and PRN Tylenol

## 2022-08-15 NOTE — Assessment & Plan Note (Signed)
DVT noted on R upper extremity. Patient R arm is not swollen and nontender. - Lovenox restarted s/p surgery, will transition to Eliquis for 3 months - Continue to monitor

## 2022-08-15 NOTE — Plan of Care (Signed)

## 2022-08-15 NOTE — Progress Notes (Signed)
Daily Progress Note Intern Pager: [phone]  Patient name: Anthony [name] Medical record number: [id number] Date of birth: 13-Jan-1971 Age: 52 y.o. Gender: male  Primary Care Provider: Erick Alley, DO Consultants: ID, Cardiology, Ortho, General Surgery  Code Status: Full Code  Pt Overview and Major Events to Date:  7/5: Admitted 7/6: Ortho consulted, stated no acute intervention indicated. ID consulted, recommended BSA bx 7/7: Hg dropped and patient received 1x pRBCs  7/8: Hg dropped again, 1x pRBCs, [name] ordered per ID recs  7/9: replaced catheter 7/12: received 1x pRBC 7/15: [name] - LVEF 55-60%, normal function 7/19: Surgery cancelled due to IT issues/outage 7/23: Diverting colostomy 7/25: Switched to PO abx - Augmentin and Doxycycline  Assessment and Plan: [name] A. Everingham is a 52 yo M with PMHx of spina bfida with paraplegia and chronic sacral ulcers admitted for septic shock 2/2 to infected sacral ulcers and osteomyelitis as supported by CT and MRI.   [facility]     * (Principal) Polymicrobial bacterial infection     Polymicrobial bacterial infection - IV Cefepime (7/5 - 7/16) & Flagyl  (7/5 -7/16 ) completed. Dr. [name] 7/15 thinks there is stool in his wounds.  General surgery performed colostomy 7/24.  Dr. [name] agreed that  hydrotherapy would be a better therapeutic option at this time vs surgical  debridement. Discontinued IV Daptomycin (7/5-7/25) and Zosyn (7/18-7/25). - Diverting colostomy: stoma clean, pink, with small amount of  serosanguinous drainage in ostomy bag, slightly tender around stoma, no  stool - Transition IV abx to PO Ciprofloxacin and Doxycyline for 10 days  (7/25-8/3) - PM EKG - AM CBC, CMP - Replete Mag and K as indicated (Mg > 2, K > 4) - Consulted PT for hydrotherapy for debridement purposes - Lovenox restarted s/p surgery, will transition to Eliquis for 3 months - BCID: strep spp + proteus, no growth - Senna daily and Miralax/Glycolax  BID on board for aggressive bowel  regimen s/p colostomy - Oxycodone 5 mg q4h PRN and Gabapentin 100 mg TID for pain - Gum for gut motility - TOC consulted for SNF placement (may be an issue d/t some debt in the  past)        Acute Right shoulder pain     XR negative for fracture. Improving with PT. - PT following - Multimodal pain management - Roxabin, Voltaren, Lidoderm and PRN Tylenol        Decubitus ulcer due to spina bifida (HCC)     Transitioned to Augmentin and Doxycycline for 14 days (7/25-8/7).   Ortho and ID recommended hydrotherapy over surgical debridement. - Wound care on board - ID on board         Anemia of infection and chronic disease     Has received 3 units pRBCS during this admission. Stable today. Hgb 7.6  this AM. S/p IV Venofer 7/25. - Requires blood from [place] (2 units in house). Type and Screen done  7/22. - AM CBC         Acute deep vein thrombosis (DVT) of axillary vein of right upper  extremity (HCC)     DVT noted on R upper extremity. Patient R arm is not swollen and  nontender. - Lovenox restarted s/p surgery, will transition to Eliquis for 3 months - Continue to monitor        Nausea & vomiting     KUB showed gaseous distention in both large and small intestines and  large bladder stones. Patient had a second episode of emesis today, GI  notified and advised patient to be NPO except for sips with meds to  further investigate. - Simethicone 80mg  q6h PRN - Compazine 10 mg q6h PRN      FEN/GI: Full Liquid Diet PPx: Eliquis Dispo: TOC - SNF & GI clearance  Subjective:  Patient is doing well this morning, shares that his pain is manageable on his medications at this time. Notes that his right shoulder is improving. No chest pain or shortness of breath, he still expresses having some gas and feels uncomfortable.   Objective: Temp:  [98.2 F (36.8 C)-99.6 F (37.6 C)] 98.3 F (36.8 C) (07/26 1116) Pulse Rate:  [102-116] 110  (07/26 1116) Resp:  [18-23] 20 (07/26 1116) BP: (105-129)/(63-74) 119/70 (07/26 1116) SpO2:  [91 %-94 %] 94 % (07/26 1116) Weight:  [76.7 kg] 76.7 kg (07/26 0537) Physical Exam: General: NAD, awake and alert, laying comfortably in bed Cardiovascular: RRR. No M/R/G Respiratory: CTAB.  Normal work of breathing on RA Abdomen: Soft. Mildly tender around laparoscopic incisions, no rigidity or guarding. Non-distended. Normoactive bowel sounds. Stoma clean, pink, with small amount of drainage in ostomy bag, slightly tender around stoma, no stool in bag.    Laboratory: Most recent CBC Lab Results  Component Value Date   WBC 14.8 (H) 08/15/2022   HGB 7.6 (L) 08/15/2022   HCT 25.8 (L) 08/15/2022   MCV 75.2 (L) 08/15/2022   PLT 861 (H) 08/15/2022   Most recent BMP    Latest Ref Rng & Units 08/15/2022    5:30 AM  BMP  Glucose 70 - 99 mg/dL 191   BUN 6 - 20 mg/dL 10   Creatinine 4.78 - 1.24 mg/dL 2.95   Sodium 621 - 308 mmol/L 135   Potassium 3.5 - 5.1 mmol/L 3.5   Chloride 98 - 111 mmol/L 96   CO2 22 - 32 mmol/L 30   Calcium 8.9 - 10.3 mg/dL 8.9     Imaging/Diagnostic Tests: XR Abdomen 1. Gaseous distention of both large and small bowel loops within the abdomen, which may represent ileus. 2. Multiple large bladder stones.  [name], DO 08/15/2022, 3:23 PM  PGY-1, [facility] Family Medicine FPTS Intern pager: [phone], text pages welcome Secure chat group [facility] Teaching Service

## 2022-08-15 NOTE — Progress Notes (Signed)
Physical Therapy Treatment Patient Details Name: Anthony [name] MRN: [id number] DOB: [birth year] Today's Date: 08/15/2022   History of Present Illness Pt is a 52 y.o. male who presented 07/25/22 with weakness and a fall. Pt admitted with severe sepsis secondary to sacral decubitus ulcer infection with concern for hip septic arthritis noted on CT. Imaging of R shoulder showed moderate acromioclavicular OA & mild distal lasteral subacromial spurring. S/p colostomy 7/23. PMH: complete paraplegia, multiple toe amputations, chronic sacral wounds, neurogenic bladder with suprapubic catheter, spina bifida, HTN    PT Comments  Emphasis on assisting mobility--rolling and holds for wound care, getting useful information for wound care evaluation 7/27 and ROM/positioning post wound care.  Pt deferred EOB or OOB today.      Assistance Recommended at Discharge Intermittent Supervision/Assistance  If plan is discharge home, recommend the following:  Can travel by private vehicle    Two people to help with walking and/or transfers;A lot of help with bathing/dressing/bathroom;Assistance with cooking/housework;Assist for transportation;Help with stairs or ramp for entrance      Equipment Recommendations  Hospital bed    Recommendations for Other Services       Precautions / Restrictions Precautions Precautions: Fall;Other (comment) (skin breakdown) Precaution Comments: paraplegia     Mobility  Bed Mobility Overal bed mobility: Needs Assistance Bed Mobility: Rolling Rolling: Min assist         General bed mobility comments: multiple roll to manage wound care with RN    Transfers                   General transfer comment: pt declined EOB or OOB after wounds care for and due to additional pain.    Ambulation/Gait                   Stairs             Wheelchair Mobility     Tilt Bed    Modified Rankin (Stroke Patients Only)       Balance                                             Cognition Arousal/Alertness: Awake/alert Behavior During Therapy: WFL for tasks assessed/performed Overall Cognitive Status: Within Functional Limits for tasks assessed                                          Exercises Other Exercises Other Exercises: basic PROM during assisting wound care and repositioned on finishing.    General Comments General comments (skin integrity, edema, etc.): vss on RA.  Assisted RN with wound care/collaboration in lieu of missed PT wound care order ( starting 7/27 now.)  Noted copious  milky-[place] purulent drainage with expression of R ischial wound.      Pertinent Vitals/Pain Pain Assessment Pain Assessment: Faces Faces Pain Scale: Hurts little more Pain Location: colostomy site and R shoulder Pain Descriptors / Indicators: Discomfort, Grimacing, Guarding Pain Intervention(s): Limited activity within patient's tolerance, Monitored during session    Home Living                          Prior Function            PT Goals (current  goals can now be found in the care plan section) Acute Rehab PT Goals PT Goal Formulation: With patient Time For Goal Achievement: 08/27/22 Potential to Achieve Goals: Good Progress towards PT goals: Progressing toward goals    Frequency    Min 1X/week      PT Plan Current plan remains appropriate    Co-evaluation              AM-PAC PT "6 Clicks" Mobility   Outcome Measure  Help needed turning from your back to your side while in a flat bed without using bedrails?: A Little Help needed moving from lying on your back to sitting on the side of a flat bed without using bedrails?: A Lot Help needed moving to and from a bed to a chair (including a wheelchair)?: A Lot Help needed standing up from a chair using your arms (e.g., wheelchair or bedside chair)?: Total Help needed to walk in hospital room?: Total Help needed climbing  3-5 steps with a railing? : Total 6 Click Score: 10    End of Session   Activity Tolerance: Patient limited by pain;Patient tolerated treatment well Patient left: in bed;with call bell/phone within reach Nurse Communication: Mobility status PT Visit Diagnosis: Muscle weakness (generalized) (M62.81);Pain Pain - Right/Left: Right Pain - part of body: Shoulder     Time: 4098-1191 PT Time Calculation (min) (ACUTE ONLY): 28 min  Charges:    $Therapeutic Activity: 23-37 mins PT General Charges $$ ACUTE PT VISIT: 1 Visit                     08/15/2022  Jacinto Halim., PT Acute Rehabilitation Services [phone]  (office)   Anthony [name] 08/15/2022, 4:30 PM

## 2022-08-16 ENCOUNTER — Inpatient Hospital Stay (HOSPITAL_COMMUNITY): Payer: Medicare HMO

## 2022-08-16 DIAGNOSIS — Z933 Colostomy status: Secondary | ICD-10-CM | POA: Diagnosis not present

## 2022-08-16 MED ORDER — POTASSIUM CHLORIDE CRYS ER 20 MEQ PO TBCR
40.0000 meq | EXTENDED_RELEASE_TABLET | Freq: Every day | ORAL | Status: DC
  Administered 2022-08-16 – 2022-08-17 (×2): 40 meq via ORAL
  Filled 2022-08-16 (×2): qty 2

## 2022-08-16 MED ORDER — OXYCODONE HCL 5 MG PO TABS
5.0000 mg | ORAL_TABLET | Freq: Four times a day (QID) | ORAL | Status: DC | PRN
  Administered 2022-08-16 – 2022-08-17 (×2): 7.5 mg via ORAL
  Filled 2022-08-16 (×3): qty 2

## 2022-08-16 MED ORDER — LACTULOSE 10 GM/15ML PO SOLN
10.0000 g | Freq: Two times a day (BID) | ORAL | Status: DC
  Administered 2022-08-16 – 2022-08-31 (×29): 10 g via ORAL
  Filled 2022-08-16 (×31): qty 15

## 2022-08-16 NOTE — Assessment & Plan Note (Addendum)
Has received 3 units pRBCS during this admission. Stable today. Hgb 7.8 this AM. S/p IV Venofer 7/25. - Requires blood from [place] (2 units in house). Type and Screen done 7/22. - AM CBC

## 2022-08-16 NOTE — Assessment & Plan Note (Signed)
XR negative for fracture. Improving with PT. - PT following - Multimodal pain management - Roxabin, Voltaren, Lidoderm and PRN Tylenol

## 2022-08-16 NOTE — Assessment & Plan Note (Addendum)
Transitioned to Augmentin and Doxycycline for 14 days (7/25-8/7).  Ortho and ID recommended hydrotherapy over surgical debridement. - awaiting PT recs re hydrotherapy - Wound care on board - ID signed off, will consult if patient not responsive to Abx regimen

## 2022-08-16 NOTE — Assessment & Plan Note (Addendum)
Polymicrobial bacterial infection - IV Cefepime (7/5 - 7/16) & Flagyl (7/5 -7/16 ) completed. 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 Diverting colostomy on 7/24. Dr. [name] agreed that hydrotherapy would be a better therapeutic option at this time vs surgical debridement. Discontinued IV Daptomycin (7/5-7/25) and Zosyn (7/18-7/25). Concern for ileus d/t intermittent episodes of N/V. KUB 7/27 pending  Diverting colostomy: slightly tender around stoma, some [place] specks visible   - Continue PO Ciprofloxacin and Doxycyline for 10 days (7/25-8/3) - Senna daily and Miralax/Glycolax BID on board for aggressive bowel regimen s/p colostomy - Reports less pain overnight, changed Oxycodone 5 mg q4h to q6h, continue Gabapentin 100 mg TID for pain, will wean as tolerated  - Gum for gut motility - notify surgery if patient continues to have N/V  - PT to drop recs re hydrotherapy   - AM CBC, CMP - Replete Mag and K as indicated (Mg > 2, K > 4) - Lovenox restarted s/p surgery, will transition to Eliquis for 3 months - BCID: strep spp + proteus, no growth

## 2022-08-16 NOTE — Progress Notes (Addendum)
Physical Therapy Wound Treatment Patient Details  Name: Anthony [name] MRN: [id number] Date of Birth: 06/14/1970  Today's Date: 08/16/2022 Time: 0903-1003 Time Calculation (min): 60 min  Subjective  Subjective Assessment Subjective: Pt stating his lt side wounds are bigger Date of Onset:  (>6 months) Prior Treatments: Dressing changes  Pain Score:    Wound Assessment  Pressure Injury 07/25/22 Hip Right Stage 4 - Full thickness tissue loss with exposed bone, tendon or muscle. large open pink red (Active)  Wound Image   08/16/22 1000  Dressing Type Gauze (Comment);ABD;Barrier Film (skin prep);Moist to moist;Tape dressing 08/16/22 1000  Dressing Changed;Clean, Dry, Intact 08/16/22 1000  Dressing Change Frequency Twice a day 08/16/22 1000  State of Healing Early/partial granulation 08/16/22 1000  Site / Wound Assessment Red;Pink 08/16/22 1000  % Wound base Red or Granulating 100% 08/16/22 1000  % Wound base Yellow/Fibrinous Exudate 0% 08/16/22 1000  % Wound base Black/Eschar 0% 08/16/22 1000  % Wound base Other/Granulation Tissue (Comment) 0% 08/16/22 1000  Peri-wound Assessment Intact;Pink 08/16/22 1000  Wound Length (cm) 6 cm 08/16/22 1000  Wound Width (cm) 6.5 cm 08/16/22 1000  Wound Depth (cm) 0.2 cm 08/16/22 1000  Wound Surface Area (cm^2) 39 cm^2 08/16/22 1000  Wound Volume (cm^3) 7.8 cm^3 08/16/22 1000  Tunneling (cm) 5 08/16/22 1000  Undermining (cm) 2.5-3 from 10-12 oclock 08/16/22 1000  Margins Unattached edges (unapproximated) 08/16/22 1000  Drainage Amount Copious 08/16/22 1000  Drainage Description Purulent;Serosanguineous 08/16/22 1000  Treatment Irrigation;Packing (Saline gauze) 08/16/22 1000     Pressure Injury 07/25/22 Sacrum Medial Stage 4 - Full thickness tissue loss with exposed bone, tendon or muscle. (Active)  Wound Image   08/16/22 1000  Dressing Type Foam - Lift dressing to assess site every shift;Gauze (Comment);Barrier Film (skin prep);Moist to moist  08/16/22 1000  Dressing Clean, Dry, Intact;Changed 08/16/22 1000  Dressing Change Frequency Twice a day 08/16/22 1000  State of Healing Early/partial granulation 08/16/22 1000  Site / Wound Assessment Pale;Pink;Red 08/16/22 1000  % Wound base Red or Granulating 100% 08/16/22 1000  % Wound base Yellow/Fibrinous Exudate 0% 08/16/22 1000  % Wound base Black/Eschar 0% 08/16/22 1000  % Wound base Other/Granulation Tissue (Comment) 0% 08/16/22 1000  Peri-wound Assessment Pink 08/16/22 1000  Wound Length (cm) 4.2 cm 08/16/22 1000  Wound Width (cm) 4.8 cm 08/16/22 1000  Wound Depth (cm) 2.8 cm 08/16/22 1000  Wound Surface Area (cm^2) 20.16 cm^2 08/16/22 1000  Wound Volume (cm^3) 56.45 cm^3 08/16/22 1000  Tunneling (cm) All three wounds connect by tunneling 08/16/22 1000  Margins Epibole (rolled edges) 08/16/22 1000  Drainage Amount Moderate 08/16/22 1000  Drainage Description Serous 08/16/22 1000  Treatment Irrigation;Packing (Saline gauze) 08/16/22 1000       Wound Assessment and Plan  Wound Therapy - Assess/Plan/Recommendations Wound Therapy - Clinical Statement: Pt presents to PT wound care with multiple wounds on sacrum/hips/ ischium. All wounds have clean bases as far as what is visible with no necrotic tissue visible. Nothing for [place] to debride. Rt hip wound is deep and with copious purulent drainage that will need frequent dressing changes and could benefit from a more absorbent dressing such as alginate. The source of this copious drainage is too deep for [place] too see and is not within our scope to explore. The lt sided wounds also have no necrotic tissue for [place] to debride. The wounds connect and had only a moderate amount of serous drainage. Will sign off for PT wound care at this  time.  Factors Delaying/Impairing Wound Healing: Infection - systemic/local, Immobility, Multiple medical problems Wound Therapy - Current Recommendations: Surgery consult (for rt hip) Wound Therapy - Follow Up  Recommendations: dressing changes by RN  Wound Therapy Goals- Improve the function of patient's integumentary system by progressing the wound(s) through the phases of wound healing (inflammation - proliferation - remodeling) by:    Goals will be updated until maximal potential achieved or discharge criteria met.  Discharge criteria: when goals achieved, discharge from hospital, MD decision/surgical intervention, no progress towards goals, refusal/missing three consecutive treatments without notification or medical reason.  GP     Charges PT Wound Care Charges $PT Hydrotherapy Dressing: 3 dressings $PT Hydrotherapy Visit: 2 Visits       Angelina Ok [facility] 08/16/2022, 10:41 AM Skip Mayer PT Acute Rehabilitation Services Office [phone]

## 2022-08-16 NOTE — Assessment & Plan Note (Addendum)
KUB 7/25 showed gaseous distention in both large and small intestines and large bladder stones. Patient had a second episode of emesis 7/26.  - KUB pending - Simethicone 80mg  q6h PRN - Compazine 10 mg q6h PRN - surgery on board

## 2022-08-16 NOTE — Progress Notes (Signed)
Daily Progress Note Intern Pager: [phone]  Patient name: Anthony [name] Medical record number: [id number] Date of birth: [birth year] Age: 52 y.o. Gender: male  Primary Care Provider: Erick Alley, DO Consultants: ID, Cardiology, Ortho, General Surgery  Code Status: Full  Pt Overview and Major Events to Date:  7/5: Admitted 7/6: Ortho consulted, stated no acute intervention indicated. ID consulted, recommended BSA bx 7/7: Hg dropped and patient received 1x pRBCs  7/8: Hg dropped again, 1x pRBCs, [name] ordered per ID recs  7/9: replaced catheter 7/12: received 1x pRBC 7/15: [name] - LVEF 55-60%, normal function 7/23: Diverting colostomy placed 7/25: Switched to PO abx - Augmentin and Doxycycline  Assessment and Plan: Darrius Inouye is a 52 yo M PMHX of spina bfida with paraplegia and chronic sacral ulcers admitted for septic shock 2/2 to infected sacral ulce and osteomyelitis as supported by CT and MRI. Patient is s/p diverting colostomy 7/23.   [facility]     * (Principal) Polymicrobial bacterial infection     Polymicrobial bacterial infection - IV Cefepime (7/5 - 7/16) & Flagyl  (7/5 -7/16 ) completed. Concern for contamination in wounds from GI  fistula. Diverting colostomy on 7/24. Dr. [name] agreed that hydrotherapy  would be a better therapeutic option at this time vs surgical debridement.  Discontinued IV Daptomycin (7/5-7/25) and Zosyn (7/18-7/25). Concern for  ileus d/t intermittent episodes of N/V. KUB 7/27 pending  Diverting colostomy: slightly tender around stoma, some [place] specks  visible   - Continue PO Ciprofloxacin and Doxycyline for 10 days (7/25-8/3) - Senna daily and Miralax/Glycolax BID on board for aggressive bowel  regimen s/p colostomy - Reports less pain overnight, changed Oxycodone 5 mg q4h to q6h, continue  Gabapentin 100 mg TID for pain, will wean as tolerated  - Gum for gut motility - notify surgery if patient continues to have N/V  - PT to  drop recs re hydrotherapy   - AM CBC, CMP - Replete Mag and K as indicated (Mg > 2, K > 4) - Lovenox restarted s/p surgery, will transition to Eliquis for 3 months - BCID: strep spp + proteus, no growth         Acute Right shoulder pain     XR negative for fracture. Improving with PT. - PT following - Multimodal pain management - Roxabin, Voltaren, Lidoderm and PRN Tylenol        Decubitus ulcer due to spina bifida (HCC)     Transitioned to Augmentin and Doxycycline for 14 days (7/25-8/7).   Ortho and ID recommended hydrotherapy over surgical debridement. - awaiting PT recs re hydrotherapy - Wound care on board - ID signed off, will consult if patient not responsive to Abx regimen         Anemia of infection and chronic disease     Has received 3 units pRBCS during this admission. Stable today. Hgb 7.8  this AM. S/p IV Venofer 7/25. - Requires blood from [place] (2 units in house). Type and Screen done  7/22. - AM CBC         Acute deep vein thrombosis (DVT) of axillary vein of right upper  extremity (HCC)     DVT noted on R upper extremity. Patient R arm is not swollen and  nontender. - Lovenox restarted s/p surgery, will transition to Eliquis for 3 months - Continue to monitor        Nausea & vomiting  KUB 7/25 showed gaseous distention in both large and small intestines  and large bladder stones. Patient had a second episode of emesis 7/26.  - KUB pending - Simethicone 80mg  q6h PRN - Compazine 10 mg q6h PRN - surgery on board    Chronic and stable:  Hx of PVCs: flecainide 50mg  BID    FEN/GI: NPO pending surgical evaluation of possible ileus  PPx: Eliquis  Dispo:patient has several barriers to discharge, currently not medically stable d/t concern for ileus   Subjective:  Patient reports he had no bouts of emesis overnight but did have some nausea after taking miralax. He states he isnt sure if he had any stool output but is wondering if this was because  he hasn't really been eating and just had some mac and cheese the day before. He reports decrease in frequency of chills and states his shoulder is feeling much better with the muscle relaxant.   Objective: Temp:  [97.9 F (36.6 C)-98.6 F (37 C)] 98.1 F (36.7 C) (07/27 0305) Pulse Rate:  [98-112] 98 (07/27 0305) Resp:  [15-23] 18 (07/27 0305) BP: (105-119)/(64-74) 105/64 (07/27 0305) SpO2:  [92 %-96 %] 92 % (07/27 0305) Physical Exam: General: asleep, comfortable in bed  Cardiovascular: tachycardia, regular rhythm, no M/R/G Respiratory: breathing comfortable on RA, no increased WOB Abdomen: mildly distended, soft, tender around stoma, incisions clean and taped. Small amount of [place] drainage in ostomy   Laboratory: Most recent CBC Lab Results  Component Value Date   WBC 15.4 (H) 08/16/2022   HGB 7.8 (L) 08/16/2022   HCT 26.8 (L) 08/16/2022   MCV 78.6 (L) 08/16/2022   PLT 792 (H) 08/16/2022   Most recent BMP    Latest Ref Rng & Units 08/16/2022    5:15 AM  BMP  Glucose 70 - 99 mg/dL 784   BUN 6 - 20 mg/dL 10   Creatinine 6.96 - 1.24 mg/dL 2.95   Sodium 284 - 132 mmol/L 134   Potassium 3.5 - 5.1 mmol/L 3.5   Chloride 98 - 111 mmol/L 96   CO2 22 - 32 mmol/L 28   Calcium 8.9 - 10.3 mg/dL 8.7     Imaging/Diagnostic Tests:  AM KUB: pending  Georg Ruddle Matha Masse, MD 08/16/2022, 6:56 AM  PGY-1, [place] [facility] FPTS Intern pager: [phone], text pages welcome Secure chat group [facility] Teaching Service

## 2022-08-16 NOTE — Assessment & Plan Note (Signed)
DVT noted on R upper extremity. Patient R arm is not swollen and nontender. - Lovenox restarted s/p surgery, will transition to Eliquis for 3 months - Continue to monitor

## 2022-08-16 NOTE — Progress Notes (Signed)
4 Days Post-Op   Subjective/Chief Complaint: A lot of air in bag, feels ok this am   Objective: Vital signs in last 24 hours: Temp:  [97.9 F (36.6 C)-98.6 F (37 C)] 98.1 F (36.7 C) (07/27 0305) Pulse Rate:  [98-112] 98 (07/27 0305) Resp:  [15-23] 18 (07/27 0305) BP: (105-119)/(64-74) 105/64 (07/27 0305) SpO2:  [92 %-96 %] 92 % (07/27 0305) Last BM Date : 08/15/22  Intake/Output from previous day: 07/26 0701 - 07/27 0700 In: 240 [P.O.:240] Out: 500 [Urine:500] Intake/Output this shift: No intake/output data recorded.  Ab ostomy with air in bag, pink, much softer today, nontender  Lab Results:  Recent Labs    08/15/22 0530 08/16/22 0515  WBC 14.8* 15.4*  HGB 7.6* 7.8*  HCT 25.8* 26.8*  PLT 861* 792*   BMET Recent Labs    08/15/22 0530 08/16/22 0515  NA 135 134*  K 3.5 3.5  CL 96* 96*  CO2 30 28  GLUCOSE 113* 108*  BUN 10 10  CREATININE 0.45* 0.51*  CALCIUM 8.9 8.7*   PT/INR No results for input(s): "LABPROT", "INR" in the last 72 hours. ABG No results for input(s): "PHART", "HCO3" in the last 72 hours.  Invalid input(s): "PCO2", "PO2"  Studies/Results: DG Abd 1 View  Result Date: 08/14/2022 CLINICAL DATA:  Emesis EXAM: ABDOMEN - 1 VIEW COMPARISON:  07/25/2022 FINDINGS: Gaseous distention of both large and small bowel loops within the abdomen. No gross free intraperitoneal air. Multiple large bladder stones. Decubitus ulcers and evidence of chronic osteomyelitis of both hips. IMPRESSION: 1. Gaseous distention of both large and small bowel loops within the abdomen, which may represent ileus. 2. Multiple large bladder stones. Electronically Signed   By: Duanne Guess D.O.   On: 08/14/2022 16:29    Anti-infectives: Anti-infectives (From admission, onward)    Start     Dose/Rate Route Frequency Ordered Stop   08/14/22 1500  ciprofloxacin (CIPRO) tablet 750 mg        750 mg Oral 2 times daily 08/14/22 1412 08/24/22 1959   08/14/22 1200   amoxicillin-clavulanate (AUGMENTIN) 875-125 MG per tablet 1 tablet  Status:  Discontinued        1 tablet Oral Every 12 hours 08/14/22 1101 08/14/22 1412   08/14/22 1200  doxycycline (VIBRA-TABS) tablet 100 mg        100 mg Oral Every 12 hours 08/14/22 1101 08/24/22 1410   08/05/22 1500  piperacillin-tazobactam (ZOSYN) IVPB 3.375 g  Status:  Discontinued        3.375 g 100 mL/hr over 30 Minutes Intravenous Every 8 hours 08/05/22 0943 08/05/22 0944   08/05/22 1500  piperacillin-tazobactam (ZOSYN) IVPB 3.375 g  Status:  Discontinued        3.375 g 12.5 mL/hr over 240 Minutes Intravenous Every 8 hours 08/05/22 0945 08/14/22 1101   07/28/22 2200  metroNIDAZOLE (FLAGYL) tablet 500 mg  Status:  Discontinued        500 mg Oral Every 12 hours 07/28/22 1412 08/05/22 0943   07/26/22 1800  ceFEPIme (MAXIPIME) 2 g in sodium chloride 0.9 % 100 mL IVPB  Status:  Discontinued        2 g 200 mL/hr over 30 Minutes Intravenous Every 8 hours 07/26/22 1745 08/05/22 0943   07/25/22 2100  ceFEPIme (MAXIPIME) 2 g in sodium chloride 0.9 % 100 mL IVPB  Status:  Discontinued        2 g 200 mL/hr over 30 Minutes Intravenous Every 12 hours 07/25/22 0926  07/26/22 1745   07/25/22 2100  metroNIDAZOLE (FLAGYL) IVPB 500 mg  Status:  Discontinued        500 mg 100 mL/hr over 60 Minutes Intravenous Every 12 hours 07/25/22 1900 07/28/22 1412   07/25/22 1700  DAPTOmycin (CUBICIN) 650 mg in sodium chloride 0.9 % IVPB  Status:  Discontinued        8 mg/kg  79.4 kg 126 mL/hr over 30 Minutes Intravenous Daily 07/25/22 1546 08/14/22 1101   07/25/22 0800  ceFEPIme (MAXIPIME) 2 g in sodium chloride 0.9 % 100 mL IVPB        2 g 200 mL/hr over 30 Minutes Intravenous  Once 07/25/22 0758 07/25/22 0942   07/25/22 0800  metroNIDAZOLE (FLAGYL) IVPB 500 mg        500 mg 100 mL/hr over 60 Minutes Intravenous  Once 07/25/22 0758 07/25/22 1159       Assessment/Plan: POD 4 lap loop sigmoid colostomy by Dr. Dwain Sarna on 7/23 for Spina  bfida with paraplegia and chronic sacral ulcers and osteomyelitis  - I think ileus resolving, dont think anything more he has a lot of air in bag, will try clears today, xray better also - WOCN following for new ostomy - Continue ostomy bridge - this can be removed around 10 days postop - Would place on bowel regimen long term -dont think tachycardia related to abdomen  FEN - clears VTE - scds Eliquis ID - On Daptomycin and Zosyn per ID Foley - SP tube (chronic)  Emelia Loron 08/16/2022

## 2022-08-17 ENCOUNTER — Inpatient Hospital Stay (HOSPITAL_COMMUNITY): Payer: Medicare HMO

## 2022-08-17 DIAGNOSIS — L8945 Pressure ulcer of contiguous site of back, buttock and hip, unstageable: Secondary | ICD-10-CM | POA: Diagnosis not present

## 2022-08-17 DIAGNOSIS — L8990 Pressure ulcer of unspecified site, unspecified stage: Secondary | ICD-10-CM | POA: Diagnosis not present

## 2022-08-17 DIAGNOSIS — A499 Bacterial infection, unspecified: Secondary | ICD-10-CM | POA: Diagnosis not present

## 2022-08-17 DIAGNOSIS — Q059 Spina bifida, unspecified: Secondary | ICD-10-CM | POA: Diagnosis not present

## 2022-08-17 DIAGNOSIS — L8944 Pressure ulcer of contiguous site of back, buttock and hip, stage 4: Secondary | ICD-10-CM

## 2022-08-17 LAB — CBC
HCT: 26.1 % — ABNORMAL LOW (ref 39.0–52.0)
Hemoglobin: 7.6 g/dL — ABNORMAL LOW (ref 13.0–17.0)
MCH: 22.8 pg — ABNORMAL LOW (ref 26.0–34.0)
MCHC: 29.1 g/dL — ABNORMAL LOW (ref 30.0–36.0)
MCV: 78.4 fL — ABNORMAL LOW (ref 80.0–100.0)
Platelets: 685 10*3/uL — ABNORMAL HIGH (ref 150–400)
RBC: 3.33 MIL/uL — ABNORMAL LOW (ref 4.22–5.81)
RDW: 23.7 % — ABNORMAL HIGH (ref 11.5–15.5)
WBC: 18 10*3/uL — ABNORMAL HIGH (ref 4.0–10.5)
nRBC: 0 % (ref 0.0–0.2)

## 2022-08-17 LAB — BASIC METABOLIC PANEL WITH GFR
Anion gap: 11 (ref 5–15)
BUN: 10 mg/dL (ref 6–20)
CO2: 27 mmol/L (ref 22–32)
Calcium: 8.9 mg/dL (ref 8.9–10.3)
Chloride: 98 mmol/L (ref 98–111)
Creatinine, Ser: 0.69 mg/dL (ref 0.61–1.24)
GFR, Estimated: >60 mL/min (ref >60)
Glucose, Bld: 114 mg/dL — ABNORMAL HIGH (ref 70–99)
Potassium: 3.6 mmol/L (ref 3.5–5.1)
Sodium: 136 mmol/L (ref 135–145)

## 2022-08-17 LAB — MAGNESIUM: Magnesium: 2 mg/dL (ref 1.7–2.4)

## 2022-08-17 MED ORDER — OXYCODONE HCL 5 MG PO TABS: 5.0000 mg | ORAL_TABLET | Freq: Four times a day (QID) | ORAL | Status: DC | PRN

## 2022-08-17 MED ORDER — PSYLLIUM 95 % PO PACK
1.0000 | PACK | Freq: Every day | ORAL | Status: DC
  Administered 2022-08-17 – 2022-08-29 (×12): 1 via ORAL
  Filled 2022-08-17 (×16): qty 1

## 2022-08-17 NOTE — Assessment & Plan Note (Addendum)
IV Cefepime (7/5 - 7/16) & Flagyl (7/5 -7/16 ) completed. 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 Diverting colostomy on 7/23. Dr. [name] agreed that hydrotherapy would be a better therapeutic option at this time vs surgical debridement. Discontinued IV Daptomycin (7/5-7/25) and Zosyn (7/18-7/25). Concern for ileus d/t intermittent episodes of N/V, seems to be improving at this time. - Continue PO Ciprofloxacin and Doxycyline for 10 days (7/25-8/3) - BCID: strep spp + proteus previously, no growth from PICC since 7/13

## 2022-08-17 NOTE — Progress Notes (Signed)
5 Days Post-Op   Subjective/Chief Complaint: Doing fine, tol clears, no n/v   Objective: Vital signs in last 24 hours: Temp:  [98 F (36.7 C)-99.1 F (37.3 C)] 98 F (36.7 C) (07/28 0255) Pulse Rate:  [105-122] 105 (07/28 0255) Resp:  [17-20] 17 (07/28 0255) BP: (100-117)/(56-74) 100/56 (07/28 0255) SpO2:  [91 %-94 %] 91 % (07/28 0255) Last BM Date : 08/15/22  Intake/Output from previous day: 07/27 0701 - 07/28 0700 In: 480 [P.O.:480] Out: 2450 [Urine:1450; Stool:1000] Intake/Output this shift: No intake/output data recorded.  Ab soft, air and some stool in bag incisions clean   Lab Results:  Recent Labs    08/16/22 0515 08/17/22 0553  WBC 15.4* 18.0*  HGB 7.8* 7.6*  HCT 26.8* 26.1*  PLT 792* 685*   BMET Recent Labs    08/16/22 0515 08/17/22 0553  NA 134* 136  K 3.5 3.6  CL 96* 98  CO2 28 27  GLUCOSE 108* 114*  BUN 10 10  CREATININE 0.51* 0.69  CALCIUM 8.7* 8.9   PT/INR No results for input(s): "LABPROT", "INR" in the last 72 hours. ABG No results for input(s): "PHART", "HCO3" in the last 72 hours.  Invalid input(s): "PCO2", "PO2"  Studies/Results: DG Abd Portable 1V  Result Date: 08/16/2022 CLINICAL DATA:  52 year old male with history of vomiting. EXAM: PORTABLE ABDOMEN - 1 VIEW COMPARISON:  08/14/2022. FINDINGS: Gas and stool are seen scattered throughout the colon extending to the level of the distal rectum. No pathologic distension of small bowel is noted. No gross evidence of pneumoperitoneum. Numerous large calcifications project over the low anatomic pelvis, compatible with large bladder stones, better demonstrated on prior CT of the abdomen and pelvis 07/25/2022. Probable drain projecting over the left sacroiliac region. IMPRESSION: 1. Nonobstructive bowel gas pattern. 2. No pneumoperitoneum. 3. Large bladder calculi redemonstrated. Electronically Signed   By: Trudie Reed M.D.   On: 08/16/2022 09:25    Anti-infectives: Anti-infectives  (From admission, onward)    Start     Dose/Rate Route Frequency Ordered Stop   08/14/22 1500  ciprofloxacin (CIPRO) tablet 750 mg        750 mg Oral 2 times daily 08/14/22 1412 08/24/22 1959   08/14/22 1200  amoxicillin-clavulanate (AUGMENTIN) 875-125 MG per tablet 1 tablet  Status:  Discontinued        1 tablet Oral Every 12 hours 08/14/22 1101 08/14/22 1412   08/14/22 1200  doxycycline (VIBRA-TABS) tablet 100 mg        100 mg Oral Every 12 hours 08/14/22 1101 08/24/22 1410   08/05/22 1500  piperacillin-tazobactam (ZOSYN) IVPB 3.375 g  Status:  Discontinued        3.375 g 100 mL/hr over 30 Minutes Intravenous Every 8 hours 08/05/22 0943 08/05/22 0944   08/05/22 1500  piperacillin-tazobactam (ZOSYN) IVPB 3.375 g  Status:  Discontinued        3.375 g 12.5 mL/hr over 240 Minutes Intravenous Every 8 hours 08/05/22 0945 08/14/22 1101   07/28/22 2200  metroNIDAZOLE (FLAGYL) tablet 500 mg  Status:  Discontinued        500 mg Oral Every 12 hours 07/28/22 1412 08/05/22 0943   07/26/22 1800  ceFEPIme (MAXIPIME) 2 g in sodium chloride 0.9 % 100 mL IVPB  Status:  Discontinued        2 g 200 mL/hr over 30 Minutes Intravenous Every 8 hours 07/26/22 1745 08/05/22 0943   07/25/22 2100  ceFEPIme (MAXIPIME) 2 g in sodium chloride 0.9 % 100  mL IVPB  Status:  Discontinued        2 g 200 mL/hr over 30 Minutes Intravenous Every 12 hours 07/25/22 0926 07/26/22 1745   07/25/22 2100  metroNIDAZOLE (FLAGYL) IVPB 500 mg  Status:  Discontinued        500 mg 100 mL/hr over 60 Minutes Intravenous Every 12 hours 07/25/22 1900 07/28/22 1412   07/25/22 1700  DAPTOmycin (CUBICIN) 650 mg in sodium chloride 0.9 % IVPB  Status:  Discontinued        8 mg/kg  79.4 kg 126 mL/hr over 30 Minutes Intravenous Daily 07/25/22 1546 08/14/22 1101   07/25/22 0800  ceFEPIme (MAXIPIME) 2 g in sodium chloride 0.9 % 100 mL IVPB        2 g 200 mL/hr over 30 Minutes Intravenous  Once 07/25/22 0758 07/25/22 0942   07/25/22 0800   metroNIDAZOLE (FLAGYL) IVPB 500 mg        500 mg 100 mL/hr over 60 Minutes Intravenous  Once 07/25/22 0758 07/25/22 1159       Assessment/Plan: POD 5 lap loop sigmoid colostomy by Dr. Dwain Sarna on 7/23 for Spina bfida with paraplegia and chronic sacral ulcers and osteomyelitis  - try soft diet today - WOCN following for new ostomy - Continue ostomy bridge - this can be removed around 10 days postop - Would place on bowel regimen long term -dont think tachycardia related to abdomen   FEN - soft VTE - scds Eliquis ID - On Daptomycin and Zosyn per ID Foley - SP tube (chronic)  Emelia Loron 08/17/2022

## 2022-08-17 NOTE — Assessment & Plan Note (Addendum)
Post-op day 5 diverting colostomy by Dr. Dwain Sarna on 7/23. Stoma clean, pink, with stool and air in ostomy bag. No more episodes of emesis. - Soft diet today - Ostomy bridge - removed around 10 days post-op per GI (8/2) - Bowel regimen: Simethicone 80 mg q6h PRN, Senna 8.6 mg daily, Psyllium 1 packet daily, and Lactulose 10 g BID. - Pain regimen: Oxycodone 5 mg q6h, Gabapentin 100 mg TID, will wean as tolerated to avoid constipation - Gum for gut motility - Replete Mag and K as indicated (Mg > 2, K > 4)

## 2022-08-17 NOTE — Assessment & Plan Note (Signed)
DVT noted on R upper extremity. Patient R arm is not swollen and nontender. - On Eliquis for 3 months - Continue to monitor

## 2022-08-17 NOTE — Progress Notes (Signed)
Daily Progress Note Intern Pager: [phone]  Patient name: Anthony [name] Medical record number: [id number] Date of birth: November 11, 1970 Age: 52 y.o. Gender: male  Primary Care Provider: Erick Alley, DO Consultants: ID, Cardiology, Ortho, General Surgery  Code Status: Full Code   Pt Overview and Major Events to Date:  7/5: Admitted 7/6: Ortho consulted, stated no acute intervention indicated. ID consulted, recommended BSA bx 7/7: Hg dropped and patient received 1x pRBCs  7/8: Hg dropped again, 1x pRBCs, [name] ordered per ID recs  7/9: replaced catheter 7/12: received 1x pRBC 7/15: [name] - LVEF 55-60%, normal function 7/19: Surgery cancelled due to IT issues/outage 7/23: Diverting colostomy 7/25: Switched to PO abx - Ciprofloxacin and Doxycycline x10 days  Assessment and Plan: Anthony [name] A. Colandrea is a 52 yo M with PMHx of spina bfida with paraplegia and chronic sacral ulcers admitted for septic shock 2/2 to infected sacral ulcers and osteomyelitis. Patient is s/p diverting colostomy on 7/23.  [facility]     * (Principal) 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     Post-op day 5 diverting colostomy by Dr. Dwain Sarna on 7/23. Stoma  clean, pink, with stool and air in ostomy bag. No more episodes of emesis. - Soft diet today - Ostomy bridge - removed around 10 days post-op per GI (8/2) - Bowel regimen: Simethicone 80 mg q6h PRN, Senna 8.6 mg daily, Psyllium 1  packet daily, and Lactulose 10 g BID. - Pain regimen: Oxycodone 5 mg q6h, Gabapentin 100 mg TID, will wean as  tolerated to avoid constipation - Gum for gut motility - Replete Mag and K as indicated (Mg > 2, K > 4)        Acute Right shoulder pain     XR negative for fracture. Improving with PT. - PT following - Multimodal pain management - Roxabin, Voltaren, Lidoderm, Muscle rub  cream, and PRN Tylenol        Decubitus ulcer due to spina bifida (HCC)     Transitioned to Ciprofloxacin and Doxycycline for 14 days (7/25-8/7).    Ortho and ID recommended hydrotherapy over surgical debridement. PT shared  that due to his deep purulent discharge, he is not a great candidate for  hydrotherapy and recommended ortho consultation again. - Consulted ortho (Dr. [name]) to determine next steps - PT on board - Wound care on board - ID signed off, will consult if patient not responsive to Abx regimen         Anemia of infection and chronic disease     Has received 3 units pRBCS during this admission. Stable today. Hgb 7.6  this AM. S/p IV Venofer 7/25. - Requires blood from [place] (2 units in house). Type and Screen done  7/22. - AM CBC        Polymicrobial bacterial infection     IV Cefepime (7/5 - 7/16) & Flagyl (7/5 -7/16 ) completed. 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 Diverting colostomy on 7/23. Dr.  [name] agreed that hydrotherapy would be a better therapeutic option at this  time vs surgical debridement. Discontinued IV Daptomycin (7/5-7/25) and  Zosyn (7/18-7/25). Concern for ileus d/t intermittent episodes of N/V,  seems to be improving at this time. - Continue PO Ciprofloxacin and Doxycyline for 10 days (7/25-8/3) - BCID: strep spp + proteus previously, no growth from PICC since 7/13        Acute deep vein thrombosis (DVT) of axillary vein of right  upper  extremity (HCC)     DVT noted on R upper extremity. Patient R arm is not swollen and  nontender. - On Eliquis for 3 months - Continue to monitor        Nausea & vomiting     KUB 7/25 showed gaseous distention in both large and small intestines  and large bladder stones. Patient had a second episode of emesis 7/26.  7/27 KUB showed nonobstructive bowel gas pattern, no pneumoperitoneum, and  large bladder calculi. No more episodes of emesis. - Simethicone 80mg  q6h PRN - Compazine 10 mg q6h PRN - Notify surgery if patient continues to have N/V       Chronic Stable Conditions:  Hx of PVCs: Flecainide 50mg  BID  FEN/GI: Soft  diet PPx: Eliquis Dispo: approaching medical stability, but uncertain if able to go home w/ 24/7 care  Subjective:  Patient is doing well this morning. Expresses no significant pain or discomfort overnight. Reports sleeping well. Has had bowel movements and denies any more episodes of emesis.   Objective: Temp:  [98 F (36.7 C)-99.2 F (37.3 C)] 99.2 F (37.3 C) (07/28 0812) Pulse Rate:  [99-122] 99 (07/28 0812) Resp:  [17-20] 20 (07/28 0812) BP: (98-117)/(56-74) 98/56 (07/28 0812) SpO2:  [91 %-93 %] 93 % (07/28 7829) Physical Exam: General: NAD, awake and alert, laying comfortably in bed Cardiovascular: Tachycardic, regular rhythm. No M/R/G Respiratory: CTAB.  Normal work of breathing on RA Abdomen: Soft. Mildly tender around laparoscopic incisions, no rigidity or guarding. Non-distended. Normoactive bowel sounds. Stoma clean, pink, with stool and air in ostomy bag.  Laboratory: Most recent CBC Lab Results  Component Value Date   WBC 18.0 (H) 08/17/2022   HGB 7.6 (L) 08/17/2022   HCT 26.1 (L) 08/17/2022   MCV 78.4 (L) 08/17/2022   PLT 685 (H) 08/17/2022   Most recent BMP    Latest Ref Rng & Units 08/17/2022    5:53 AM  BMP  Glucose 70 - 99 mg/dL 562   BUN 6 - 20 mg/dL 10   Creatinine 1.30 - 1.24 mg/dL 8.65   Sodium 784 - 696 mmol/L 136   Potassium 3.5 - 5.1 mmol/L 3.6   Chloride 98 - 111 mmol/L 98   CO2 22 - 32 mmol/L 27   Calcium 8.9 - 10.3 mg/dL 8.9     Mag: 2.0  Imaging/Diagnostic Tests: KUB - 7/27 1. Nonobstructive bowel gas pattern. 2. No pneumoperitoneum. 3. Large bladder calculi redemonstrated.  Anthony Curling, DO 08/17/2022, 10:54 AM PGY-1, [facility]  FPTS Intern pager: [phone], text pages welcome Secure chat group [facility] Teaching Service

## 2022-08-17 NOTE — Progress Notes (Signed)
Patient ID: Anthony [name], male   DOB: 07/19/1970, 52 y.o.   MRN: [id number] Patient has purulent drainage from the right hip and had contamination of the left hip wound as well.  Will plan for surgical debridement of both hips on Wednesday.

## 2022-08-17 NOTE — H&P (View-Only) (Signed)
Patient ID: Anthony [name], male   DOB: 07/19/1970, 52 y.o.   MRN: [id number] Patient has purulent drainage from the right hip and had contamination of the left hip wound as well.  Will plan for surgical debridement of both hips on Wednesday.

## 2022-08-17 NOTE — Assessment & Plan Note (Addendum)
KUB 7/25 showed gaseous distention in both large and small intestines and large bladder stones. Patient had a second episode of emesis 7/26. 7/27 KUB showed nonobstructive bowel gas pattern, no pneumoperitoneum, and large bladder calculi. No more episodes of emesis. - Simethicone 80mg  q6h PRN - Compazine 10 mg q6h PRN - Notify surgery if patient continues to have N/V

## 2022-08-17 NOTE — Assessment & Plan Note (Addendum)
Has received 3 units pRBCS during this admission. Stable today. Hgb 7.6 this AM. S/p IV Venofer 7/25. - Requires blood from [place] (2 units in house). Type and Screen done 7/22. - AM CBC

## 2022-08-17 NOTE — Assessment & Plan Note (Addendum)
XR negative for fracture. Improving with PT. - PT following - Multimodal pain management - Roxabin, Voltaren, Lidoderm, Muscle rub cream, and PRN Tylenol

## 2022-08-17 NOTE — Assessment & Plan Note (Addendum)
Transitioned to Ciprofloxacin and Doxycycline for 14 days (7/25-8/7).  Ortho and ID recommended hydrotherapy over surgical debridement. PT shared that due to his deep purulent discharge, he is not a great candidate for hydrotherapy and recommended ortho consultation again. - Consulted ortho (Dr. [name]) to determine next steps - PT on board - Wound care on board - ID signed off, will consult if patient not responsive to Abx regimen

## 2022-08-18 DIAGNOSIS — A499 Bacterial infection, unspecified: Secondary | ICD-10-CM | POA: Diagnosis not present

## 2022-08-18 LAB — CK: Total CK: <5 U/L — ABNORMAL LOW (ref 49–397)

## 2022-08-18 MED ORDER — MAGNESIUM SULFATE IN D5W 1-5 GM/100ML-% IV SOLN
1.0000 g | Freq: Once | INTRAVENOUS | Status: AC
  Administered 2022-08-18: 1 g via INTRAVENOUS
  Filled 2022-08-18: qty 100

## 2022-08-18 MED ORDER — PIPERACILLIN-TAZOBACTAM 3.375 G IVPB
3.3750 g | Freq: Three times a day (TID) | INTRAVENOUS | Status: DC
  Administered 2022-08-18 (×3): 3.375 g via INTRAVENOUS
  Filled 2022-08-18 (×3): qty 50

## 2022-08-18 MED ORDER — ENOXAPARIN SODIUM 80 MG/0.8ML IJ SOSY
1.0000 mg/kg | PREFILLED_SYRINGE | Freq: Two times a day (BID) | INTRAMUSCULAR | Status: DC
  Administered 2022-08-18 – 2022-08-19 (×2): 75 mg via SUBCUTANEOUS
  Filled 2022-08-18 (×2): qty 0.8

## 2022-08-18 MED ORDER — SODIUM CHLORIDE 0.9 % IV SOLN
8.0000 mg/kg | Freq: Every day | INTRAVENOUS | Status: DC
  Administered 2022-08-18: 600 mg via INTRAVENOUS
  Filled 2022-08-18 (×2): qty 12

## 2022-08-18 MED ORDER — LACTATED RINGERS IV BOLUS
500.0000 mL | Freq: Once | INTRAVENOUS | Status: AC
  Administered 2022-08-18: 500 mL via INTRAVENOUS

## 2022-08-18 MED ORDER — ORAL CARE MOUTH RINSE: 15.0000 mL | OROMUCOSAL | Status: DC | PRN

## 2022-08-18 MED ORDER — POTASSIUM CHLORIDE CRYS ER 20 MEQ PO TBCR
40.0000 meq | EXTENDED_RELEASE_TABLET | Freq: Once | ORAL | Status: AC
  Administered 2022-08-18: 40 meq via ORAL
  Filled 2022-08-18: qty 2

## 2022-08-18 NOTE — Assessment & Plan Note (Signed)
Transitioned to Ciprofloxacin and Doxycycline for 14 days (7/25-8/7). PT shared that due to his deep purulent discharge, he is not a great candidate for hydrotherapy and ortho will do surgical debridement on 7/31. - Consulted ortho (Dr. [name]) - surgical debridement 7/31 - PT on board - Wound care on board - ID signed off, will consult if patient not responsive to Abx regimen

## 2022-08-18 NOTE — Consult Note (Addendum)
WOC Nurse ostomy follow up Stoma type/location: LUQ colostomy  Stomal assessment/size: 2" slightly oval, red moist, edematous, well budded, slight edge of sloughing noted at 9 o'clock; red rubber bridge noted to be in place  Peristomal assessment: intact  Treatment options for stomal/peristomal skin: 2" barrier ring  Output approximately 100 mls soft stool  Ostomy pouching: 2 piece 2 3/4" and 2" barrier ring   Education provided: Reviewed with patient emptying when 1/3 to 1/2 full.  Patient able to open pouch and drain into cylinder at this visit, I cleaned spout with toilet paper wick.  I closed pouch for patient as he has difficulty with movement of R arm post fall (is right handed).  Reviewed removing old pouch with push pull technique and cleaning stoma with water moistened washcloth only.  Reviewed sizing stoma with patient today for first month postop as edema will continue to subside.  We also discussed red rubber catheter will be removed at approximately 10 days postop and will make pouching easier.  Patient was able to cut pouch at 2" today, I sized to slightly oval and to accommodate stabilizing catheter.  Patient had hand held mirror mother had brought so he watched me place 2" barrier ring around stoma and I assisted him to place the skin barrier he had cut around stoma. Patient was able to snap pouch onto skin barrier.  Does have difficulty opening and closing pouch d/t R arm pain.    Enrolled patient in [place] Secure Start Discharge program: Yes, at previous visit   Patient states he is going for inpatient rehab after he is discharged from inpatient.  He is scheduled for bilateral hip wound debridement on Wednesday per patient.    I ordered (6) 2 3/4" skin barriers Lawson #2, (6) 2 3/4" pouches [place] [phone]) and (6) 2" barrier rings [place] [phone].   WOC Nurse wound follow up; these wounds are being followed by orthopedics; per patient planned debridement of wounds Wednesday 08/20/2022   Wound type:Chronic Pressure Injuries bilateral hips  Measurement: 1.  R hip Stage 4 Pressure Injury 9 cm x 5 cm x 4 cm what can be visualized of wound bed is 100% pink moist, noted to have copious tan exudate coming out of 4 cm depth when turned  2.  L hip Stage 4 Pressure Injury 1 cm x 1 cm x 5 cm that tunnels approximately 9 cms to adjoin L ischial wound 2 cm x 2 cm moderate amount of serosanguinous drainage from this wound; what can be visualized of wound bed is pink and moist  Wound bed: what can be visualized is pink and moist  Drainage (amount, consistency, odor) copious tan from R hip, moderate serosanguinous L hip  Periwound: scarring  Dressing procedure/placement/frequency: Clean R and L hip wounds with Vashe, utilizing Q tip applicator insert Vashe moistened gauze into tunnels making sure to cover entire wound bed twice daily. Cover with dry gauze and silicone foam or ABD pad whichever is preferred.   WOC team will continue to follow for ostomy teaching and support.  Ortho following B hip wounds.    Thank you,    Priscella Mann MSN, RN-BC, [employer] [phone]

## 2022-08-18 NOTE — Progress Notes (Signed)
6 Days Post-Op   Subjective/Chief Complaint: Doing fine, tol soft diet.  Bag working well.   Objective: Vital signs in last 24 hours: Temp:  [98 F (36.7 C)-100.2 F (37.9 C)] 98 F (36.7 C) (07/29 0818) Pulse Rate:  [94-111] 95 (07/29 0818) Resp:  [10-22] 18 (07/29 0818) BP: (100-116)/(57-68) 114/65 (07/29 0818) SpO2:  [90 %-98 %] 95 % (07/29 0818) Weight:  [75.3 kg] 75.3 kg (07/29 0500) Last BM Date : 08/17/22  Intake/Output from previous day: 07/28 0701 - 07/29 0700 In: 237 [P.O.:237] Out: 1875 [Urine:1475; Stool:400] Intake/Output this shift: Total I/O In: -  Out: 300 [Stool:300]  Abd: soft, air and some stool in bag, incisions clean   Lab Results:  Recent Labs    08/17/22 0553 08/18/22 0505  WBC 18.0* 15.4*  HGB 7.6* 7.8*  HCT 26.1* 27.3*  PLT 685* 641*   BMET Recent Labs    08/17/22 0553 08/18/22 0505  NA 136 135  K 3.6 3.6  CL 98 98  CO2 27 29  GLUCOSE 114* 116*  BUN 10 11  CREATININE 0.69 0.68  CALCIUM 8.9 9.2   PT/INR No results for input(s): "LABPROT", "INR" in the last 72 hours. ABG No results for input(s): "PHART", "HCO3" in the last 72 hours.  Invalid input(s): "PCO2", "PO2"  Studies/Results: DG CHEST PORT 1 VIEW  Result Date: 08/17/2022 CLINICAL DATA:  Dyspnea. EXAM: PORTABLE CHEST 1 VIEW COMPARISON:  None Available. FINDINGS: The heart size is normal. Lung volumes are low. No edema or effusion is present. No focal airspace disease is present. IMPRESSION: 1. Low lung volumes. 2. No acute cardiopulmonary disease. Electronically Signed   By: Marin Roberts M.D.   On: 08/17/2022 16:35    Anti-infectives: Anti-infectives (From admission, onward)    Start     Dose/Rate Route Frequency Ordered Stop   08/18/22 0830  DAPTOmycin (CUBICIN) 600 mg in sodium chloride 0.9 % IVPB        8 mg/kg  75.3 kg 124 mL/hr over 30 Minutes Intravenous Daily 08/18/22 0649     08/18/22 0745  piperacillin-tazobactam (ZOSYN) IVPB 3.375 g        3.375  g 12.5 mL/hr over 240 Minutes Intravenous Every 8 hours 08/18/22 0649     08/14/22 1500  ciprofloxacin (CIPRO) tablet 750 mg  Status:  Discontinued        750 mg Oral 2 times daily 08/14/22 1412 08/18/22 0649   08/14/22 1200  amoxicillin-clavulanate (AUGMENTIN) 875-125 MG per tablet 1 tablet  Status:  Discontinued        1 tablet Oral Every 12 hours 08/14/22 1101 08/14/22 1412   08/14/22 1200  doxycycline (VIBRA-TABS) tablet 100 mg  Status:  Discontinued        100 mg Oral Every 12 hours 08/14/22 1101 08/18/22 0649   08/05/22 1500  piperacillin-tazobactam (ZOSYN) IVPB 3.375 g  Status:  Discontinued        3.375 g 100 mL/hr over 30 Minutes Intravenous Every 8 hours 08/05/22 0943 08/05/22 0944   08/05/22 1500  piperacillin-tazobactam (ZOSYN) IVPB 3.375 g  Status:  Discontinued        3.375 g 12.5 mL/hr over 240 Minutes Intravenous Every 8 hours 08/05/22 0945 08/14/22 1101   07/28/22 2200  metroNIDAZOLE (FLAGYL) tablet 500 mg  Status:  Discontinued        500 mg Oral Every 12 hours 07/28/22 1412 08/05/22 0943   07/26/22 1800  ceFEPIme (MAXIPIME) 2 g in sodium chloride 0.9 % 100  mL IVPB  Status:  Discontinued        2 g 200 mL/hr over 30 Minutes Intravenous Every 8 hours 07/26/22 1745 08/05/22 0943   07/25/22 2100  ceFEPIme (MAXIPIME) 2 g in sodium chloride 0.9 % 100 mL IVPB  Status:  Discontinued        2 g 200 mL/hr over 30 Minutes Intravenous Every 12 hours 07/25/22 0926 07/26/22 1745   07/25/22 2100  metroNIDAZOLE (FLAGYL) IVPB 500 mg  Status:  Discontinued        500 mg 100 mL/hr over 60 Minutes Intravenous Every 12 hours 07/25/22 1900 07/28/22 1412   07/25/22 1700  DAPTOmycin (CUBICIN) 650 mg in sodium chloride 0.9 % IVPB  Status:  Discontinued        8 mg/kg  79.4 kg 126 mL/hr over 30 Minutes Intravenous Daily 07/25/22 1546 08/14/22 1101   07/25/22 0800  ceFEPIme (MAXIPIME) 2 g in sodium chloride 0.9 % 100 mL IVPB        2 g 200 mL/hr over 30 Minutes Intravenous  Once 07/25/22 0758  07/25/22 0942   07/25/22 0800  metroNIDAZOLE (FLAGYL) IVPB 500 mg        500 mg 100 mL/hr over 60 Minutes Intravenous  Once 07/25/22 0758 07/25/22 1159       Assessment/Plan: POD 6, lap loop sigmoid colostomy by Dr. Dwain Sarna on 7/23 for Spina bfida with paraplegia and chronic sacral ulcers and osteomyelitis  - soft diet today - WOCN following for new ostomy - Continue ostomy bridge - this can be removed around 10 days postop - Would place on bowel regimen long term   FEN - soft VTE - scds, Eliquis ID - On Daptomycin and Zosyn per ID Foley - SP tube (chronic)  Letha Cape, PA-C 08/18/2022

## 2022-08-18 NOTE — TOC Progression Note (Signed)
Transition of Care [facility]) - Progression Note    Patient Details  Name: Anthony [name] MRN: [id number] Date of Birth: [birth year]  Transition of Care [facility]) CM/SW Contact  Eduard Roux, [place] Phone Number: 08/18/2022, 10:14 AM  Clinical Narrative:     Patient has no bed offers at this time.  [name] will continue to follow and assist with discharge planning.  [name], MSW, LCSW Clinical Social Worker    Expected Discharge Plan: Skilled Nursing Facility Barriers to Discharge: Continued Medical Work up  Expected Discharge Plan and Services In-house Referral: Clinical Social Work   Post Acute Care Choice: Skilled Nursing Facility Living arrangements for the past 2 months: Single Family Home                                       Social Determinants of Health (SDOH) Interventions SDOH Screenings   Food Insecurity: No Food Insecurity (07/26/2022)  Housing: Low Risk  (07/26/2022)  Transportation Needs: No Transportation Needs (07/26/2022)  Utilities: Not At Risk (07/26/2022)  Alcohol Screen: Low Risk  (01/23/2022)  Depression (PHQ2-9): Low Risk  (02/27/2022)  Financial Resource Strain: Medium Risk (01/23/2022)  Physical Activity: Inactive (01/23/2022)  Social Connections: Socially Isolated (01/23/2022)  Stress: Stress Concern Present (01/23/2022)  Tobacco Use: Medium Risk (08/04/2022)    Readmission Risk Interventions     No data to display

## 2022-08-18 NOTE — Plan of Care (Signed)
  Problem: Education: Goal: Knowledge of General Education information will improve Description: Including pain rating scale, medication(s)/side effects and non-pharmacologic comfort measures 08/18/2022 0619 by Dorathy Daft, RN Outcome: Progressing 08/18/2022 0618 by Dorathy Daft, RN Outcome: Progressing   Problem: Clinical Measurements: Goal: Ability to maintain clinical measurements within normal limits will improve 08/18/2022 0619 by Dorathy Daft, RN Outcome: Progressing 08/18/2022 0618 by Dorathy Daft, RN Outcome: Progressing Goal: Will remain free from infection 08/18/2022 0619 by Dorathy Daft, RN Outcome: Not Progressing  - temp elevated overnight, MD notified.  08/18/2022 0618 by Dorathy Daft, RN Outcome: Progressing Goal: Diagnostic test results will improve 08/18/2022 0619 by Dorathy Daft, RN Outcome: Progressing 08/18/2022 0618 by Dorathy Daft, RN Outcome: Progressing Goal: Cardiovascular complication will be avoided 08/18/2022 2130 by Dorathy Daft, RN Outcome: Progressing 08/18/2022 0618 by Dorathy Daft, RN Outcome: Progressing

## 2022-08-18 NOTE — Progress Notes (Signed)
Temp 100.2 and pt reports feeling chills. PRN tylenol given and MD notified.    08/17/22 1937  Assess: MEWS Score  Temp 100.2 F (37.9 C)  BP 100/68  MAP (mmHg) 79  Pulse Rate (!) 110  ECG Heart Rate (!) 111  Resp 20  SpO2 94 %  O2 Device Room Air  Patient Activity (if Appropriate) In bed  Assess: MEWS Score  MEWS Temp 0  MEWS Systolic 1  MEWS Pulse 2  MEWS RR 0  MEWS LOC 0  MEWS Score 3  MEWS Score Color Yellow  Assess: if the MEWS score is Yellow or Red  Were vital signs accurate and taken at a resting state? Yes  Does the patient meet 2 or more of the SIRS criteria? No  Does the patient have a confirmed or suspected source of infection? Yes  MEWS guidelines implemented  Yes, yellow  Treat  MEWS Interventions Considered administering scheduled or prn medications/treatments as ordered  Take Vital Signs  Increase Vital Sign Frequency  Yellow: Q2hr x1, continue Q4hrs until patient remains green for 12hrs  Escalate  MEWS: Escalate Yellow: Discuss with charge nurse and consider notifying provider and/or RRT  Notify: Charge Nurse/RN  Name of Charge Nurse/RN Notified Tim  RN  Provider Notification  Provider Name/Title Family Medicine Teaching Service group chat  Date Provider Notified 08/17/22  Time Provider Notified 2002  Method of Notification  (secure chat)  Notification Reason Other (Comment) (temp 100.2 and pt feels chills. giving tylenol now.  yellow mews due to HR 100-100s)  Provider response No new orders  Date of Provider Response 08/17/22  Time of Provider Response 2002  Assess: SIRS CRITERIA  SIRS Temperature  0  SIRS Pulse 1  SIRS Respirations  0  SIRS WBC 0  SIRS Score Sum  1

## 2022-08-18 NOTE — TOC Progression Note (Signed)
Transition of Care [facility]) - Progression Note    Patient Details  Name: Anthony [name] MRN: [id number] Date of Birth: 1970/08/15  Transition of Care [facility]) CM/SW Contact  Eduard Roux, [place] Phone Number: 08/18/2022, 4:16 PM  Clinical Narrative:     [name] met with patient at bedside- informed to disucss disposition once he mis stable for discharge. Explained he has no bed offers. Patient expressed being hopeful for CIR. [name] inquired if he spoke with [facility] regarding monies owed. He states he called but no solution was made.Patient states if unable to d/c to CIR he is not sure what will be the plan.   [name], MSW, LCSW Clinical Social Worker    Expected Discharge Plan: Skilled Nursing Facility Barriers to Discharge: Continued Medical Work up  Expected Discharge Plan and Services In-house Referral: Clinical Social Work   Post Acute Care Choice: Skilled Nursing Facility Living arrangements for the past 2 months: Single Family Home                                       Social Determinants of Health (SDOH) Interventions SDOH Screenings   Food Insecurity: No Food Insecurity (07/26/2022)  Housing: Low Risk  (07/26/2022)  Transportation Needs: No Transportation Needs (07/26/2022)  Utilities: Not At Risk (07/26/2022)  Alcohol Screen: Low Risk  (01/23/2022)  Depression (PHQ2-9): Low Risk  (02/27/2022)  Financial Resource Strain: Medium Risk (01/23/2022)  Physical Activity: Inactive (01/23/2022)  Social Connections: Socially Isolated (01/23/2022)  Stress: Stress Concern Present (01/23/2022)  Tobacco Use: Medium Risk (08/04/2022)    Readmission Risk Interventions     No data to display

## 2022-08-18 NOTE — Assessment & Plan Note (Addendum)
Has received 3 units pRBCS during this admission. Stable today. Hgb 7.8 this AM. S/p IV Venofer 7/25. - Requires blood from [place] (2 units in house). Type and Screen ordered 7/29. - AM CBC

## 2022-08-18 NOTE — Progress Notes (Signed)
   08/18/22 1708  Assess: MEWS Score  Temp (!) 101.6 F (38.7 C)  BP 104/63  MAP (mmHg) 74  Pulse Rate (!) 115  ECG Heart Rate (!) 116  Resp 16  Level of Consciousness Alert  SpO2 95 %  O2 Device Room Air  Patient Activity (if Appropriate) In bed  Assess: MEWS Score  MEWS Temp 2  MEWS Systolic 0  MEWS Pulse 2  MEWS RR 0  MEWS LOC 0  MEWS Score 4  MEWS Score Color Red  Assess: if the MEWS score is Yellow or Red  Were vital signs accurate and taken at a resting state? Yes  Does the patient meet 2 or more of the SIRS criteria? Yes  Does the patient have a confirmed or suspected source of infection? Yes  MEWS guidelines implemented  Yes, red  Treat  MEWS Interventions Considered administering scheduled or prn medications/treatments as ordered  Take Vital Signs  Increase Vital Sign Frequency  Red: Q1hr x2, continue Q4hrs until patient remains green for 12hrs  Escalate  MEWS: Escalate Red: Discuss with charge nurse and notify provider. Consider notifying RRT. If remains red for 2 hours consider need for higher level of care  Notify: Charge Nurse/RN  Name of Charge Nurse/RN Notified Judie Grieve, RN  Provider Notification  Provider Name/Title Family Medicine Teaching services  Date Provider Notified 08/18/22  Time Provider Notified 1710  Method of Notification Page  Notification Reason Change in status  Provider response See new orders  Date of Provider Response 08/18/22  Time of Provider Response 1710  Notify: Rapid Response  Name of Rapid Response RN Notified Hella  Date Rapid Response Notified 08/18/22  Time Rapid Response Notified 1721  Assess: SIRS CRITERIA  SIRS Temperature  1  SIRS Pulse 1  SIRS Respirations  0  SIRS WBC 0  SIRS Score Sum  2

## 2022-08-18 NOTE — Progress Notes (Signed)
Daily Progress Note Intern Pager: [phone]  Patient name: Anthony [name] Medical record number: [id number] Date of birth: 1970-07-02 Age: 52 y.o. Gender: male  Primary Care Provider: Erick Alley, DO Consultants: ID, Cardiology, Ortho, General Surgery  Code Status: Full Code  Pt Overview and Major Events to Date:  7/5: Admitted 7/6: Ortho consulted, stated no acute intervention indicated. ID consulted, recommended BSA bx 7/7: Hg dropped and patient received 1x pRBCs  7/8: Hg dropped again, 1x pRBCs, [name] ordered per ID recs  7/9: replaced catheter 7/12: received 1x pRBC 7/15: [name] - LVEF 55-60%, normal function 7/19: Surgery cancelled due to IT issues/outage 7/23: Diverting colostomy 7/25: Switched to PO abx - Ciprofloxacin and Doxycycline x10 days 7/26: Switched Lovenox to Eliquis 7/29: Switched back to IV abx - IV Daptomycin and IV Zosyn  Assessment and Plan: [name] is a 52 yo M with PMHx of spina bfida with paraplegia and chronic sacral ulcers admitted for septic shock 2/2 to infected sacral ulcers and osteomyelitis. Patient is s/p diverting colostomy on 7/23.  [facility]     * (Principal) 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     Post-op day 5 diverting colostomy by Dr. Dwain Sarna on 7/23. Stoma  clean, pink, with stool and air in ostomy bag. No more episodes of emesis. - Soft diet today - Ostomy bridge - removed around 10 days post-op per GI (8/2) - Bowel regimen: Simethicone 80 mg q6h PRN, Senna 8.6 mg daily, Psyllium 1  packet daily, and Lactulose 10 g BID. - Pain regimen: Oxycodone 5 mg q6h, Gabapentin 100 mg TID, will wean as  tolerated to avoid constipation - Gum for gut motility - Replete Mag and K as indicated (Mg > 2, K > 4)        Acute Right shoulder pain     XR negative for fracture. Improving with PT. - PT following - Multimodal pain management - Roxabin, Voltaren, Lidoderm, Muscle rub  cream, and PRN Tylenol        Decubitus ulcer due to spina  bifida (HCC)     Transitioned to Ciprofloxacin and Doxycycline for 14 days (7/25-8/7).  PT shared that due to his deep purulent discharge, he is not a great  candidate for hydrotherapy and ortho will do surgical debridement on 7/31. - Consulted ortho (Dr. [name]) - surgical debridement 7/31 - PT on board - Wound care on board - ID signed off, will consult if patient not responsive to Abx regimen         Anemia of infection and chronic disease     Has received 3 units pRBCS during this admission. Stable today. Hgb 7.8  this AM. S/p IV Venofer 7/25. - Requires blood from [place] (2 units in house). Type and Screen  ordered 7/29. - AM CBC        Polymicrobial bacterial infection     IV Cefepime (7/5 - 7/16) & Flagyl (7/5 -7/16) completed. 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 Diverting colostomy on 7/23. Dr.  [name] agreed that hydrotherapy would be a better therapeutic option at this  time vs surgical debridement. Discontinued IV Daptomycin (7/5-7/25) and  Zosyn (7/18-7/25). Concern for ileus d/t intermittent episodes of N/V,  seems to be improving at this time. Switched from PO abx to IV abx  overnight due to concern for lack of source control. - Continue IV Daptomycin and Zosyn (7/29-) and reevaluate s/p surgical  debridement to determine  what source control is like - BCID: strep spp + proteus previously, no growth from PICC since 7/13        Acute deep vein thrombosis (DVT) of axillary vein of right upper  extremity (HCC)     DVT noted on R upper extremity. Patient R arm is not swollen and  nontender. - On Eliquis for 3 months (7/26) - Continue to monitor        Nausea & vomiting     KUB 7/25 showed gaseous distention in both large and small intestines  and large bladder stones. Patient had a second episode of emesis 7/26.  7/27 KUB showed nonobstructive bowel gas pattern, no pneumoperitoneum, and  large bladder calculi. No more episodes of emesis. 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 He reports that he had chills overnight and a fever of 102. Has continued to have bowel movements.  Objective: Temp:  [98 F (36.7 C)-100.2 F (37.9 C)] 98.5 F (36.9 C) (07/29 1257) Pulse Rate:  [94-111] 95 (07/29 0818) Resp:  [10-22] 18 (07/29 0818) BP: (100-116)/(58-70) 110/70 (07/29 1257) SpO2:  [94 %-98 %] 95 % (07/29 0818) Weight:  [75.3 kg] 75.3 kg (07/29 0500) Physical Exam: General: Awake and alert. NAD. Cardiovascular: RRR. No M/R/G. Respiratory: CTAB. Normal WOB on RA. No wheezing, crackles, or rhonchi Abdomen: soft, non-tender, non-distended. Normotensive bowel sounds. Stoma clean, pink, with stool and air in ostomy bag.   Laboratory: Most recent CBC Lab Results  Component Value Date   WBC 15.4 (H) 08/18/2022   HGB 7.8 (L) 08/18/2022   HCT 27.3 (L) 08/18/2022   MCV 77.3 (L) 08/18/2022   PLT 641 (H) 08/18/2022   Most recent BMP    Latest Ref Rng & Units 08/18/2022    5:05 AM  BMP  Glucose 70 - 99 mg/dL 259   BUN 6 - 20 mg/dL 11   Creatinine 5.63 - 1.24 mg/dL 8.75   Sodium 643 - 329 mmol/L 135   Potassium 3.5 - 5.1 mmol/L 3.6   Chloride 98 - 111 mmol/L 98   CO2 22 - 32 mmol/L 29   Calcium 8.9 - 10.3 mg/dL 9.2     Mag: 1.9  Imaging/Diagnostic Tests: No new imaging.  [name], DO 08/18/2022, 1:42 PM PGY-1, [facility] Family Medicine  FPTS Intern pager: [phone], text pages welcome Secure chat group [facility]  Teaching Service

## 2022-08-18 NOTE — Assessment & Plan Note (Signed)
XR negative for fracture. Improving with PT. - PT following - Multimodal pain management - Roxabin, Voltaren, Lidoderm, Muscle rub cream, and PRN Tylenol

## 2022-08-18 NOTE — Assessment & Plan Note (Addendum)
IV Cefepime (7/5 - 7/16) & Flagyl (7/5 -7/16) completed. 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 Diverting colostomy on 7/23. Dr. [name] agreed that hydrotherapy would be a better therapeutic option at this time vs surgical debridement. Discontinued IV Daptomycin (7/5-7/25) and Zosyn (7/18-7/25). Concern for ileus d/t intermittent episodes of N/V, seems to be improving at this time. Switched from PO abx to IV abx overnight due to concern for lack of source control. - Continue IV Daptomycin and Zosyn (7/29-) and reevaluate s/p surgical debridement to determine what source control is like - BCID: strep spp + proteus previously, no growth from PICC since 7/13

## 2022-08-18 NOTE — Progress Notes (Signed)
FMTS Interim Progress Note  S:Assessed at bedside due to Red MEWS and fever to 101.6. Mr. Bras tells me he feels okay. No acute complaints at this time.  He does feel that there has been an increase in the purulent discharge from his right hip specifically  O: BP 104/63   Pulse (!) 115   Temp (!) 101.6 F (38.7 C)   Resp 16   Ht 5\' 8"  (1.727 m)   Wt 75.3 kg   SpO2 95%   BMI 25.24 kg/m   Gen: At baseline. Mentating clearly and in good spirits. Cardio: Regular, tachycardic Abd: Non-distended, colostomy bag in place, +stool output   A/P: Recurrent fevers. Abx were escalated overnight to Daptomycin + Pip-Tazo.  Strongly suspect his R hip is our source. Scheduled for surgery with Dr. [name] on Wednesday.  - Will repeat blood cultures - If fever curve does not improve, can escalate coverage to meropenem to cover ESBL given that he was growing proteus and would like to cover for any resistance - 500 mL LR bolus x1 - Will have our night team round on him   Alicia Amel, MD 08/18/2022, 5:36 PM PGY-3, [facility] Family Medicine Service pager [phone]

## 2022-08-18 NOTE — Progress Notes (Signed)
FMTS Brief Progress Note  S: Patient seen at bedside with Dr. Laroy Apple. He is resting comfortably watching tv. He states he is "doing better" and has had a decent appetite. He reports some stool output into colostomy bag throughout the day today.  O: BP 99/63 (BP Location: Right Leg)   Pulse (!) 113   Temp (!) 101.4 F (38.6 C) (Axillary)   Resp 19   Ht 5\' 8"  (1.727 m)   Wt 75.3 kg   SpO2 96%   BMI 25.24 kg/m   General: Patient at baseline. Comfortable, no acute distress Cardio: Regular, tachycardic Pulm: CTA bilaterally. No increased work of breathing Abdominal: bowel sounds present, soft, non-tender. Colostomy bag in place with stool output. Neuro: alert and oriented x3, speech normal in content. Psych:  Cognition and judgment appear intact.  A/P: Recurrent fevers - Plan as per day team progress notes. - Orders reviewed. Labs for AM (CBC, BMP, Mag, type and screen) ordered, which was adjusted as needed.  - If condition changes, plan includes reevaluate.   Cyndia Skeeters, DO 08/18/2022, 7:48 PM PGY-1, [facility] Night Resident  Please page [phone] with questions.

## 2022-08-18 NOTE — Assessment & Plan Note (Signed)
DVT noted on R upper extremity. Patient R arm is not swollen and nontender. - On Eliquis for 3 months (7/26) - Continue to monitor

## 2022-08-18 NOTE — Progress Notes (Signed)
Physical Therapy Treatment Patient Details Name: Anthony [name] MRN: [id number] DOB: 1970/07/02 Today's Date: 08/18/2022   History of Present Illness Pt is a 52 y.o. male who presented 07/25/22 with weakness and a fall. Pt admitted with severe sepsis secondary to sacral decubitus ulcer infection with concern for hip septic arthritis noted on CT. Imaging of R shoulder showed moderate acromioclavicular OA & mild distal lasteral subacromial spurring. S/p colostomy 7/23. PMH: complete paraplegia, multiple toe amputations, chronic sacral wounds, neurogenic bladder with suprapubic catheter, spina bifida, HTN    PT Comments  Pt received in supine and agreeable to session. Pt requesting to focus on RUE mobility, but is agreeable to sitting EOB. Pt able to participate in AAROM in supine and while sitting EOB. Pt noted to heavily compensate with R shoulder elevation during shoulder flexion and abduction. Scapular mobs to promote scapular upward rotation performed with slight improvement in AROM and pain. Pt continues to report no pain with shoulder depression during mobility tasks and is able to lift bottom in sitting to scoot forward to EOB. Pt continues to require up to mod A for bed mobility due to RUE pain. Pt continues to benefit from PT services to progress toward functional mobility goals.    If plan is discharge home, recommend the following: Two people to help with walking and/or transfers;A lot of help with bathing/dressing/bathroom;Assistance with cooking/housework;Assist for transportation;Help with stairs or ramp for entrance   Can travel by private vehicle        Equipment Recommendations  Hospital bed    Recommendations for Other Services       Precautions / Restrictions Precautions Precautions: Fall;Other (comment) Precaution Comments: paraplegia Restrictions Weight Bearing Restrictions: Yes RLE Weight Bearing: Non weight bearing LLE Weight Bearing: Non weight bearing      Mobility  Bed Mobility Overal bed mobility: Needs Assistance Bed Mobility: Supine to Sit, Sit to Supine     Supine to sit: Mod assist Sit to supine: Mod assist   General bed mobility comments: Assist for trunk elevation and BLE management. Pt able to manage BLE some, however is limited by R shoulder pain    Transfers                   General transfer comment: Pt deferred OOB mobility        Balance Overall balance assessment: Needs assistance Sitting-balance support: Feet unsupported, Bilateral upper extremity supported Sitting balance-Leahy Scale: Fair Sitting balance - Comments: sitting EOB. Requires UE support. Pt demonstrates some LOB when elevating either UE, but is able to correct without assist                                    Cognition Arousal/Alertness: Awake/alert Behavior During Therapy: WFL for tasks assessed/performed Overall Cognitive Status: Within Functional Limits for tasks assessed                                          Exercises General Exercises - Upper Extremity Shoulder Flexion: AAROM, Right, 10 reps, Supine, Seated Shoulder ABduction: AAROM, Right, 10 reps, Supine, Seated Shoulder Horizontal ADduction: AAROM, Supine, Right, 5 reps Other Exercises Other Exercises: R scapular mobs during R shoulder flexion to promote scapular upward rotation    General Comments General comments (skin integrity, edema, etc.): Drainage noted on bedpad  and R hip dressing, NT present      Pertinent Vitals/Pain Pain Assessment Pain Assessment: Faces Faces Pain Scale: Hurts even more Pain Location: R shoulder with mobility Pain Descriptors / Indicators: Discomfort, Grimacing, Guarding Pain Intervention(s): Limited activity within patient's tolerance, Monitored during session     PT Goals (current goals can now be found in the care plan section) Acute Rehab PT Goals Patient Stated Goal: home PT Goal Formulation:  With patient Time For Goal Achievement: 08/27/22 Potential to Achieve Goals: Good Progress towards PT goals: Progressing toward goals    Frequency    Min 1X/week      PT Plan Current plan remains appropriate       AM-PAC PT "6 Clicks" Mobility   Outcome Measure  Help needed turning from your back to your side while in a flat bed without using bedrails?: A Little Help needed moving from lying on your back to sitting on the side of a flat bed without using bedrails?: A Lot Help needed moving to and from a bed to a chair (including a wheelchair)?: A Lot Help needed standing up from a chair using your arms (e.g., wheelchair or bedside chair)?: Total Help needed to walk in hospital room?: Total Help needed climbing 3-5 steps with a railing? : Total 6 Click Score: 10    End of Session   Activity Tolerance: Patient tolerated treatment well Patient left: in bed;with call bell/phone within reach Nurse Communication: Mobility status PT Visit Diagnosis: Muscle weakness (generalized) (M62.81);Pain     Time: 2703-5009 PT Time Calculation (min) (ACUTE ONLY): 39 min  Charges:    $Therapeutic Exercise: 23-37 mins $Therapeutic Activity: 8-22 mins PT General Charges $$ ACUTE PT VISIT: 1 Visit                     [name], PTA Acute Rehabilitation Services Secure Chat Preferred  [phone]    [name] 08/18/2022, 12:57 PM

## 2022-08-19 DIAGNOSIS — A499 Bacterial infection, unspecified: Secondary | ICD-10-CM | POA: Diagnosis not present

## 2022-08-19 LAB — BPAM RBC
Blood Product Expiration Date: 202408082359
Blood Product Expiration Date: 202408122359
Blood Product Expiration Date: 202408222359
Blood Product Expiration Date: 202408222359
Unit Type and Rh: 5100
Unit Type and Rh: 5100
Unit Type and Rh: 5100
Unit Type and Rh: 5100

## 2022-08-19 LAB — SURGICAL PCR SCREEN
MRSA, PCR: NEGATIVE
Staphylococcus aureus: NEGATIVE

## 2022-08-19 MED ORDER — POTASSIUM CHLORIDE CRYS ER 20 MEQ PO TBCR
40.0000 meq | EXTENDED_RELEASE_TABLET | Freq: Once | ORAL | Status: AC
  Administered 2022-08-19: 40 meq via ORAL
  Filled 2022-08-19: qty 2

## 2022-08-19 MED ORDER — LACTATED RINGERS IV BOLUS
500.0000 mL | Freq: Once | INTRAVENOUS | Status: AC
  Administered 2022-08-19: 500 mL via INTRAVENOUS

## 2022-08-19 MED ORDER — SODIUM CHLORIDE 0.9 % IV SOLN
8.0000 mg/kg | Freq: Every day | INTRAVENOUS | Status: DC
  Administered 2022-08-19 – 2022-08-31 (×13): 600 mg via INTRAVENOUS
  Filled 2022-08-19 (×14): qty 12

## 2022-08-19 MED ORDER — CEFAZOLIN SODIUM-DEXTROSE 2-4 GM/100ML-% IV SOLN
2.0000 g | INTRAVENOUS | Status: AC
  Administered 2022-08-20: 2 g via INTRAVENOUS
  Filled 2022-08-19: qty 100

## 2022-08-19 MED ORDER — CHLORHEXIDINE GLUCONATE 4 % EX SOLN
60.0000 mL | Freq: Once | CUTANEOUS | Status: AC
  Administered 2022-08-20: 4 via TOPICAL
  Filled 2022-08-19: qty 60

## 2022-08-19 MED ORDER — POVIDONE-IODINE 10 % EX SWAB
2.0000 | Freq: Once | CUTANEOUS | Status: AC
  Administered 2022-08-20: 2 via TOPICAL

## 2022-08-19 MED ORDER — IBUPROFEN 400 MG PO TABS
600.0000 mg | ORAL_TABLET | Freq: Once | ORAL | Status: AC
  Administered 2022-08-19: 600 mg via ORAL
  Filled 2022-08-19: qty 2

## 2022-08-19 MED ORDER — ENOXAPARIN SODIUM 80 MG/0.8ML IJ SOSY
1.0000 mg/kg | PREFILLED_SYRINGE | Freq: Two times a day (BID) | INTRAMUSCULAR | Status: AC
  Administered 2022-08-19: 75 mg via SUBCUTANEOUS
  Filled 2022-08-19: qty 0.8

## 2022-08-19 MED ORDER — SODIUM CHLORIDE 0.9 % IV SOLN
1.0000 g | Freq: Three times a day (TID) | INTRAVENOUS | Status: DC
  Administered 2022-08-19 – 2022-08-26 (×22): 1 g via INTRAVENOUS
  Filled 2022-08-19 (×22): qty 20

## 2022-08-19 MED ORDER — ACETAMINOPHEN 500 MG PO TABS
1000.0000 mg | ORAL_TABLET | Freq: Four times a day (QID) | ORAL | Status: DC
  Administered 2022-08-19 – 2022-08-21 (×7): 1000 mg via ORAL
  Filled 2022-08-19 (×8): qty 2

## 2022-08-19 NOTE — Assessment & Plan Note (Addendum)
Transitioned to Ciprofloxacin and Doxycycline for 14 days (7/25-8/7). PT shared that due to his deep purulent discharge, he is not a great candidate for hydrotherapy and ortho will do surgical debridement on 7/31. - Consulted ortho (Dr. [name]) - surgical debridement 7/31 - PT on board - Wound care on board - ID signed off, will consult if patient not responsive to Abx regimen

## 2022-08-19 NOTE — Assessment & Plan Note (Signed)
KUB 7/25 showed gaseous distention in both large and small intestines and large bladder stones. Patient had a second episode of emesis 7/26. 7/27 KUB showed nonobstructive bowel gas pattern, no pneumoperitoneum, and large bladder calculi. No more episodes of emesis. - Simethicone 80mg  q6h PRN - Compazine 10 mg q6h PRN - Notify surgery if patient continues to have N/V

## 2022-08-19 NOTE — Assessment & Plan Note (Addendum)
IV Cefepime (7/5 - 7/16) & Flagyl (7/5 -7/16) completed. 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 Diverting colostomy on 7/23. Dr. [name] agreed that hydrotherapy would be a better therapeutic option at this time vs surgical debridement, however PT doesn't believe his wounds are amenable to hydrotherapy due to deep purulent discharge. Discontinued IV Daptomycin (7/5-7/25) and Zosyn (7/18-7/25). Concern for ileus d/t intermittent episodes of N/V s/p colostomy, but has improved since. Switched from PO abx to IV Daptomycin and Zosyn (7/29) due to concern for lack of source control. - IV abx switched to IV Daptomycin and Meropenem (7/30-) due to multiple fevers and tachycardia overnight - Reevaluate IV abx s/p surgical debridement per Dr. [name] 7/31 to determine what source control is like - Last dose of Lovenox this PM prior to surgery, NPO at MN - BCID: strep spp + proteus previously, no growth < 24 hours

## 2022-08-19 NOTE — Progress Notes (Signed)
Pharmacy Antibiotic Note  Anthony [name] is a 52 y.o. male admitted on 07/25/2022 with  chronic wounds/osteo .  Pharmacy has been consulted for changing Zosyn to Merrem. Already on Daptomycin. Continues to spike fevers.   Plan: Already on Daptomycin DC Zosyn Start Merrem 1g IV q8h  Height: 5\' 8"  (172.7 cm) Weight: 75.3 kg (166 lb) IBW/kg (Calculated) : 68.4  Temp (24hrs), Avg:100.3 F (37.9 C), Min:98 F (36.7 C), Max:101.6 F (38.7 C)  Recent Labs  Lab 08/14/22 0530 08/15/22 0530 08/16/22 0515 08/17/22 0553 08/18/22 0505  WBC 11.4* 14.8* 15.4* 18.0* 15.4*  CREATININE  --  0.45* 0.51* 0.69 0.68    Estimated Creatinine Clearance: 104.5 mL/min (by C-G formula based on SCr of 0.68 mg/dL).    Allergies  Allergen Reactions   Firvanq [Vancomycin] Itching    Abran Duke, PharmD, BCPS Clinical Pharmacist Phone: [phone]

## 2022-08-19 NOTE — Assessment & Plan Note (Signed)
Has received 3 units pRBCS during this admission. Stable today. Hgb 7.8 this AM. S/p IV Venofer 7/25. - Requires blood from [facility] (2 units in house). Type and Screen ordered 7/29. - AM CBC to monitor

## 2022-08-19 NOTE — Progress Notes (Signed)
Physical Therapy Treatment Patient Details Name: Anthony [name] MRN: [id number] DOB: 01-12-1971 Today's Date: 08/19/2022   History of Present Illness Pt is a 52 y.o. male who presented 07/25/22 with weakness and a fall. Pt admitted with severe sepsis secondary to sacral decubitus ulcer infection with concern for hip septic arthritis noted on CT. Imaging of R shoulder showed moderate acromioclavicular OA & mild distal lasteral subacromial spurring. S/p colostomy 7/23. PMH: complete paraplegia, multiple toe amputations, chronic sacral wounds, neurogenic bladder with suprapubic catheter, spina bifida, HTN    PT Comments  Pt received in supine and agreeable to session. Pt noted to have moved rooms and w/c to remain in previous room at this time, so session limited to bed level. Pt continues to demonstrate limited R shoulder ROM due to pain and noted compensation of shoulder elevation during flexion and abduction movements. Pt noting pain in front of R shoulder and in armpit area during mobility. Massage performed to pectoralis tendon during AAROM to reduce tension and increase ROM. Pt able to demonstrate some improvement in R shoulder flexion and abduction AROM after technique. Pt requires assist for BLE management during bed mobility due to RUE pain. Pt continues to benefit from PT services to progress toward functional mobility goals.    If plan is discharge home, recommend the following: Two people to help with walking and/or transfers;A lot of help with bathing/dressing/bathroom;Assistance with cooking/housework;Assist for transportation;Help with stairs or ramp for entrance   Can travel by private vehicle        Equipment Recommendations  Hospital bed    Recommendations for Other Services       Precautions / Restrictions Precautions Precautions: Fall;Other (comment) Precaution Comments: paraplegia     Mobility  Bed Mobility Overal bed mobility: Needs Assistance Bed Mobility: Supine to  Sit, Sit to Supine     Supine to sit: Mod assist Sit to supine: Mod assist   General bed mobility comments: Mod A for BLE management and light assist for balance initially once sitting EOB    Transfers                   General transfer comment: unable due to w/c out of room        Balance Overall balance assessment: Needs assistance Sitting-balance support: Feet unsupported, Bilateral upper extremity supported, Single extremity supported Sitting balance-Leahy Scale: Fair Sitting balance - Comments: sitting EOB. Requires single UE support                                    Cognition Arousal/Alertness: Awake/alert Behavior During Therapy: WFL for tasks assessed/performed Overall Cognitive Status: Within Functional Limits for tasks assessed                                          Exercises General Exercises - Upper Extremity Shoulder Flexion: AAROM, Right, Supine, Seated, 15 reps Shoulder Extension: Right, 5 reps, AROM, Seated Shoulder ABduction: AAROM, Right, Supine, Seated, 15 reps Chair Push Up: AROM, Both, Seated, 5 reps Other Exercises Other Exercises: R scapular mobs during R shoulder flexion to promote scapular upward rotation Other Exercises: massage to R pec tendon during shoulder flexion and abduction x10 reps    General Comments General comments (skin integrity, edema, etc.): Drainage noted on R side of gown at end  of session, nursing notified      Pertinent Vitals/Pain Pain Assessment Pain Assessment: Faces Faces Pain Scale: Hurts even more Pain Location: R shoulder with mobility Pain Descriptors / Indicators: Discomfort, Grimacing, Guarding Pain Intervention(s): Limited activity within patient's tolerance, Monitored during session, Repositioned     PT Goals (current goals can now be found in the care plan section) Acute Rehab PT Goals Patient Stated Goal: home PT Goal Formulation: With patient Time For Goal  Achievement: 08/27/22 Potential to Achieve Goals: Good Progress towards PT goals: Progressing toward goals    Frequency    Min 1X/week      PT Plan Current plan remains appropriate       AM-PAC PT "6 Clicks" Mobility   Outcome Measure  Help needed turning from your back to your side while in a flat bed without using bedrails?: A Little Help needed moving from lying on your back to sitting on the side of a flat bed without using bedrails?: A Lot Help needed moving to and from a bed to a chair (including a wheelchair)?: A Lot Help needed standing up from a chair using your arms (e.g., wheelchair or bedside chair)?: Total Help needed to walk in hospital room?: Total Help needed climbing 3-5 steps with a railing? : Total 6 Click Score: 10    End of Session   Activity Tolerance: Patient tolerated treatment well Patient left: in bed;with call bell/phone within reach;with family/visitor present Nurse Communication: Mobility status PT Visit Diagnosis: Muscle weakness (generalized) (M62.81);Pain Pain - Right/Left: Right Pain - part of body: Shoulder     Time: 1344-1416 PT Time Calculation (min) (ACUTE ONLY): 32 min  Charges:    $Therapeutic Exercise: 8-22 mins $Therapeutic Activity: 8-22 mins PT General Charges $$ ACUTE PT VISIT: 1 Visit                     [name], PTA Acute Rehabilitation Services Secure Chat Preferred  [phone]    [name] 08/19/2022, 2:25 PM

## 2022-08-19 NOTE — Assessment & Plan Note (Signed)
XR negative for fracture. Improving with PT. - PT following - Multimodal pain management - Roxabin, Voltaren, Lidoderm, Muscle rub cream, and PRN Tylenol

## 2022-08-19 NOTE — Progress Notes (Addendum)
Daily Progress Note Intern Pager: [phone]  Patient name: Anthony [name] Medical record number: [id number] Date of birth: 06-21-1970 Age: 52 y.o. Gender: male  Primary Care Provider: Erick Alley, DO Consultants: ID, Cardiology, Ortho, General Surgery  Code Status: Full Code  Pt Overview and Major Events to Date:  7/5: Admitted 7/6: Ortho consulted, stated no acute intervention indicated. ID consulted, recommended BSA bx 7/7: Hg dropped and patient received 1x pRBCs  7/8: Hg dropped again, 1x pRBCs, [name] ordered per ID recs  7/9: replaced catheter 7/12: received 1x pRBC 7/15: [name] - LVEF 55-60%, normal function 7/19: Surgery cancelled due to IT issues/outage 7/23: Diverting colostomy 7/25: Switched to PO abx - Ciprofloxacin and Doxycycline x10 days 7/26: Switched Lovenox to Eliquis 7/29: Switched back to IV abx - IV Daptomycin and IV Zosyn 7/30: Switched abx to Meropenem  Assessment and Plan: Anthony [name] is a 52 yo M with PMHx of spina bfida with paraplegia and chronic sacral ulcers admitted for septic shock 2/2 to infected sacral ulcers and osteomyelitis. Patient is s/p diverting colostomy on 7/23.  [facility]     * (Principal) Polymicrobial bacterial infection     IV Cefepime (7/5 - 7/16) & Flagyl (7/5 -7/16) completed. 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 Diverting colostomy on 7/23. Dr.  [name] agreed that hydrotherapy would be a better therapeutic option at this  time vs surgical debridement, however PT doesn't believe his wounds are  amenable to hydrotherapy due to deep purulent discharge. Discontinued IV  Daptomycin (7/5-7/25) and Zosyn (7/18-7/25). Concern for ileus d/t  intermittent episodes of N/V s/p colostomy, but has improved since.  Switched from PO abx to IV Daptomycin and Zosyn (7/29) due to concern for  lack of source control. - IV abx switched to IV Daptomycin and Meropenem (7/30-) due to multiple  fevers and tachycardia  overnight - Reevaluate IV abx s/p surgical debridement per Dr. [name] 7/31 to  determine what source control is like - Last dose of Lovenox this PM prior to surgery, NPO at MN - BCID: strep spp + proteus previously, no growth < 24 hours        Acute Right shoulder pain     XR negative for fracture. Improving with PT. - PT following - Multimodal pain management - Roxabin, Voltaren, Lidoderm, Muscle rub  cream, and PRN Tylenol        Decubitus ulcer due to spina bifida (HCC)     Transitioned to Ciprofloxacin and Doxycycline for 14 days (7/25-8/7).  PT shared that due to his deep purulent discharge, he is not a great  candidate for hydrotherapy and ortho will do surgical debridement on 7/31. - Consulted ortho (Dr. [name]) - surgical debridement 7/31 - PT on board - Wound care on board - ID signed off, will consult if patient not responsive to Abx regimen         Anemia of infection and chronic disease     Has received 3 units pRBCS during this admission. Stable today. Hgb 7.8  this AM. S/p IV Venofer 7/25. - Requires blood from [facility] (2 units in house). Type and Screen  ordered 7/29. - AM CBC to monitor        Acute deep vein thrombosis (DVT) of axillary vein of right upper  extremity (HCC)     DVT noted on R upper extremity. Patient R arm is not swollen and  nontender. - On Eliquis for  3 months (7/26), currently being held in preparation for  surgery - currently on Lovenox, last dose this evening at 2200 - Continue to monitor        Nausea & vomiting     KUB 7/25 showed gaseous distention in both large and small intestines  and large bladder stones. Patient had a second episode of emesis 7/26.  7/27 KUB showed nonobstructive bowel gas pattern, no pneumoperitoneum, and  large bladder calculi. No more episodes of emesis. - Simethicone 80mg  q6h PRN - Compazine 10 mg q6h PRN - Notify surgery if patient continues to have N/V         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     Post-op  day 5 diverting colostomy by Dr. Dwain Sarna on 7/23. Stoma  clean, pink, with stool and air in ostomy bag. No more episodes of emesis. - Soft diet today - Ostomy bridge - removed around 10 days post-op per GI (8/2) - Bowel regimen: Simethicone 80 mg q6h PRN, Senna 8.6 mg daily, Psyllium 1  packet daily, and Lactulose 10 g BID. 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 Patient was resting comfortably this morning and reports that his pain has improved today and he has been outputting stool.   Objective: Temp:  [98.4 F (36.9 C)-101.6 F (38.7 C)] 99.9 F (37.7 C) (07/30 1355) Pulse Rate:  [86-116] 103 (07/30 1200) Resp:  [14-22] 20 (07/30 1200) BP: (94-130)/(53-73) 130/69 (07/30 1200) SpO2:  [94 %-98 %] 98 % (07/30 1200) Physical Exam: General: Awake and alert. NAD. Cardiovascular: RRR. No M/R/G. Respiratory: CTAB. Normal WOB on RA. No wheezing, crackles, or rhonchi Abdomen: soft, non-tender, non-distended. Normotensive bowel sounds. Stoma with stool in ostomy bag.   Laboratory: Most recent CBC Lab Results  Component Value Date   WBC 13.7 (H) 08/19/2022   HGB 7.8 (L) 08/19/2022   HCT 26.5 (L) 08/19/2022   MCV 78.6 (L) 08/19/2022   PLT 540 (H) 08/19/2022   Most recent BMP    Latest Ref Rng & Units 08/19/2022    6:15 AM  BMP  Glucose 70 - 99 mg/dL 474   BUN 6 - 20 mg/dL 10    Creatinine 2.59 - 1.24 mg/dL 5.63   Sodium 875 - 643 mmol/L 136   Potassium 3.5 - 5.1 mmol/L 3.3   Chloride 98 - 111 mmol/L 100   CO2 22 - 32 mmol/L 26   Calcium 8.9 - 10.3 mg/dL 8.6     Imaging/Diagnostic Tests: No new imaging.  [name], DO 08/19/2022, 2:18 PM PGY-1, [facility]  FPTS Intern pager: [phone], text pages welcome Secure chat group [email or web address] Teaching Service

## 2022-08-19 NOTE — Progress Notes (Signed)
Nutrition Follow-up  DOCUMENTATION CODES:   Not applicable  INTERVENTION:  D/c Magic cup TID with meals, each supplement provides 290 kcal and 9 grams of protein  Continue Ensure Plus High Protein po TID, each supplement provides 350 kcal and 20 grams of protein.  Encourage po intake   NUTRITION DIAGNOSIS:   Increased nutrient needs related to wound healing as evidenced by estimated needs. -ongoing   GOAL:   Patient will meet greater than or equal to 90% of their needs -slowly progressing?   MONITOR:   PO intake, Supplement acceptance, Labs, Weight trends, Skin, I & O's  REASON FOR ASSESSMENT:   Consult Assessment of nutrition requirement/status  ASSESSMENT:   52 y.o. male with past medical history of spina bifida with secondary paraplegia, chronic decubitis uclers with osteomyelitis, chronic foley d/t neurogenic bladder, wheelchair bound, endocarditis and hypertension who is admitted with sepsis, R > L hip cellulitis, osteomyelitis with bony destruction of pubic rami and RUE DVT.  7/23 s/p diverting colostomy   Visited patient at bedside who has been having intermittent fevers.  Plans for debridement on 7/31 for Polymicrobial bacteremia with osteomyelitis and uncontrolled infection of the pelvis   Patient reports he has been eating well and did not have any follow up questions on diet edu last week. RD observed several snacks and food items at bedside, including a magic cup, which patient reports he no longer wants to receive-- RD to dc order.   He wishes to continue Ensure ONS and reports he likes those.   Patient reports that he has been getting good output in ostomy and that has pain has decreased.   No safe discharge plans at this time   Labs: K+ 3.3, Glu 108, alk phos 208  Meds: vitamin C, Ensure TID, chronulac, merrem, MVI, senokot, NS, zinc sulfate, compazine PRN   Wt: admit- 175#, current- 166# 9# (5%) wt loss x <1 month PO: 43% avg po x 5 meals    I/O's:  -15.6  L since admission    Diet Order:   Diet Order             Diet NPO time specified  Diet effective ____           DIET SOFT Room service appropriate? Yes; Fluid consistency: Thin  Diet effective now                   EDUCATION NEEDS:   Education needs have been addressed  Skin:  Skin Assessment: Reviewed RN Assessment (Stage IV decubitus ulcers R & L sacrum, R hip ulcer) Skin Integrity Issues:: Stage II, Stage IV Stage II: sacrum Stage IV: sacrum, R hip  Last BM:  7/30 425 ml output via colostomy   Height:   Ht Readings from Last 1 Encounters:  08/12/22 5\' 8"  (1.727 m)    Weight:   Wt Readings from Last 1 Encounters:  08/18/22 75.3 kg    Ideal Body Weight:  70 kg  BMI:  Body mass index is 25.24 kg/m.  Estimated Nutritional Needs:   Kcal:  2100-2400kcal/day  Protein:  105-120g/day  Fluid:  2.1-2.4L/day    Leodis Rains, RDN, LDN  Clinical Nutrition

## 2022-08-19 NOTE — Progress Notes (Signed)
OT Cancellation Note  Patient Details Name: Anthony [name] MRN: [id number] DOB: [birth year]   Cancelled Treatment:    Reason Eval/Treat Not Completed: Other (comment). Pt just done working with PT and now family arrived and visiting with pt. OT will follow up next available day/time  Galen Manila 08/19/2022, 2:48 PM

## 2022-08-19 NOTE — Assessment & Plan Note (Signed)
Post-op day 5 diverting colostomy by Dr. Dwain Sarna on 7/23. Stoma clean, pink, with stool and air in ostomy bag. No more episodes of emesis. - Soft diet today - Ostomy bridge - removed around 10 days post-op per GI (8/2) - Bowel regimen: Simethicone 80 mg q6h PRN, Senna 8.6 mg daily, Psyllium 1 packet daily, and Lactulose 10 g BID. - Pain regimen: Oxycodone 5 mg q6h, Gabapentin 100 mg TID, will wean as tolerated to avoid constipation - Replete Mag and K as indicated (Mg > 2, K > 4)

## 2022-08-19 NOTE — Assessment & Plan Note (Signed)
DVT noted on R upper extremity. Patient R arm is not swollen and nontender. - On Eliquis for 3 months (7/26), currently being held in preparation for surgery - on subcutaneous heparin last dose this evening prior to surgery - Continue to monitor

## 2022-08-19 NOTE — Progress Notes (Addendum)
FMTS Brief Progress Note  S: Patient seen at bedside with Dr. Laroy Apple.  He states he is doing well.  He denies pain, denies noticing any drainage out of his chronic wounds.  Reports he felt chills earlier but does not at this time. States he feels Tylenol is not doing much for his fever.   O: BP 110/62 (BP Location: Left Leg)   Pulse (!) 129   Temp (!) 102.8 F (39.3 C) (Oral)   Resp (!) 22   Ht 5\' 8"  (1.727 m)   Wt 75.3 kg   SpO2 96%   BMI 25.24 kg/m   General: Awake and alert, no acute distress, febrile to 103 Heart: Tachycardic, regular rhythm Lungs: CTA bilaterally, work of breathing, on room air. Abdomen: Normoactive bowel sounds.  Soft, nontender.  Ostomy bag in place. Skin: very warm to the touch during exam  A/P: Polymicrobial bacterial infection with recurrent fevers Patient is very warm to the touch during exam, and he does not feel like scheduled Tylenol has helped much. Patient has scheduled Tylenol.  He received 500 cc bolus at 6 PM. - Have ordered another 500 cc bolus to be given now - one time dose ibuprofen for fever - Orders reviewed. Labs for AM ordered, which was adjusted as needed.  - If condition changes, plan includes reevaluation.  - All other plans per day team progress note  Cyndia Skeeters, DO 08/19/2022, 9:38 PM PGY-1, [facility] Night Resident  Please page [phone] with questions.

## 2022-08-19 NOTE — Progress Notes (Signed)
   08/19/22 1754  Assess: MEWS Score  Temp (!) 103 F (39.4 C)  BP 100/68  MAP (mmHg) 79  Pulse Rate (!) 131  ECG Heart Rate (!) 132  Resp 15  SpO2 94 %  Assess: MEWS Score  MEWS Temp 2  MEWS Systolic 1  MEWS Pulse 3  MEWS RR 0  MEWS LOC 0  MEWS Score 6  MEWS Score Color Red  Assess: if the MEWS score is Yellow or Red  Were vital signs accurate and taken at a resting state? Yes  Does the patient meet 2 or more of the SIRS criteria? Yes  Does the patient have a confirmed or suspected source of infection? Yes  MEWS guidelines implemented  No, previously red, continue vital signs every 4 hours  Notify: Charge Nurse/RN  Name of Charge Nurse/RN Notified [place], RN  Provider Notification  Provider Name/Title Glendale Chard, DO  Date Provider Notified 08/19/22  Time Provider Notified 1754  Method of Notification Page  Notification Reason Change in status (fever, red mews)  Provider response See new orders  Date of Provider Response 08/19/22  Time of Provider Response 1755  Assess: SIRS CRITERIA  SIRS Temperature  1  SIRS Pulse 1  SIRS Respirations  0  SIRS WBC 1  SIRS Score Sum  3

## 2022-08-19 NOTE — Assessment & Plan Note (Addendum)
DVT noted on R upper extremity. Patient R arm is not swollen and nontender. - On Eliquis for 3 months (7/26), currently being held post surgery d/t blood loss and risk of bleeding, will restart 8/1 - Continue to monitor

## 2022-08-19 NOTE — Plan of Care (Signed)

## 2022-08-20 ENCOUNTER — Other Ambulatory Visit: Payer: Self-pay

## 2022-08-20 ENCOUNTER — Encounter (HOSPITAL_COMMUNITY): Payer: Self-pay | Admitting: Family Medicine

## 2022-08-20 ENCOUNTER — Inpatient Hospital Stay (HOSPITAL_COMMUNITY): Payer: Medicare HMO

## 2022-08-20 ENCOUNTER — Inpatient Hospital Stay (HOSPITAL_COMMUNITY): Payer: Medicare HMO | Admitting: Anesthesiology

## 2022-08-20 ENCOUNTER — Encounter (HOSPITAL_COMMUNITY)
Admission: EM | Disposition: A | Discharge status: Skilled Nursing Facility | Payer: Self-pay | Source: Home / Self Care | Attending: Family Medicine

## 2022-08-20 DIAGNOSIS — Z87891 Personal history of nicotine dependence: Secondary | ICD-10-CM

## 2022-08-20 DIAGNOSIS — L8944 Pressure ulcer of contiguous site of back, buttock and hip, stage 4: Secondary | ICD-10-CM | POA: Diagnosis not present

## 2022-08-20 DIAGNOSIS — A499 Bacterial infection, unspecified: Secondary | ICD-10-CM | POA: Diagnosis not present

## 2022-08-20 DIAGNOSIS — L8945 Pressure ulcer of contiguous site of back, buttock and hip, unstageable: Secondary | ICD-10-CM | POA: Diagnosis not present

## 2022-08-20 DIAGNOSIS — E785 Hyperlipidemia, unspecified: Secondary | ICD-10-CM

## 2022-08-20 DIAGNOSIS — L02416 Cutaneous abscess of left lower limb: Secondary | ICD-10-CM

## 2022-08-20 DIAGNOSIS — I10 Essential (primary) hypertension: Secondary | ICD-10-CM

## 2022-08-20 DIAGNOSIS — L02415 Cutaneous abscess of right lower limb: Secondary | ICD-10-CM | POA: Diagnosis not present

## 2022-08-20 HISTORY — PX: I & D EXTREMITY: SHX5045

## 2022-08-20 LAB — MAGNESIUM: Magnesium: 1.9 mg/dL (ref 1.7–2.4)

## 2022-08-20 LAB — URINALYSIS, COMPLETE (UACMP) WITH MICROSCOPIC
Bilirubin Urine: NEGATIVE
Glucose, UA: NEGATIVE mg/dL
Hgb urine dipstick: NEGATIVE
Ketones, ur: NEGATIVE mg/dL
Leukocytes,Ua: NEGATIVE
Nitrite: NEGATIVE
Protein, ur: 100 mg/dL — AB
Specific Gravity, Urine: >1.03 — ABNORMAL HIGH (ref 1.005–1.030)
pH: 6 (ref 5.0–8.0)

## 2022-08-20 LAB — AEROBIC/ANAEROBIC CULTURE W GRAM STAIN (SURGICAL/DEEP WOUND)

## 2022-08-20 LAB — TRANSFUSION REACTION
DAT C3: NEGATIVE
Post RXN DAT IgG: POSITIVE

## 2022-08-20 LAB — PREPARE RBC (CROSSMATCH)

## 2022-08-20 SURGERY — IRRIGATION AND DEBRIDEMENT EXTREMITY
Anesthesia: General | Site: Hip | Laterality: Bilateral

## 2022-08-20 MED ORDER — PROPOFOL 10 MG/ML IV BOLUS
INTRAVENOUS | Status: DC | PRN
  Administered 2022-08-20: 90 mg via INTRAVENOUS

## 2022-08-20 MED ORDER — VITAMIN C 500 MG PO TABS: 1000.0000 mg | ORAL_TABLET | Freq: Every day | ORAL | Status: DC

## 2022-08-20 MED ORDER — METOPROLOL TARTRATE 5 MG/5ML IV SOLN: 2.0000 mg | INTRAVENOUS | Status: DC | PRN

## 2022-08-20 MED ORDER — LACTATED RINGERS IV SOLN: INTRAVENOUS | Status: DC

## 2022-08-20 MED ORDER — POTASSIUM CHLORIDE 10 MEQ/100ML IV SOLN
10.0000 meq | INTRAVENOUS | Status: AC
  Administered 2022-08-20: 10 meq via INTRAVENOUS
  Filled 2022-08-20 (×3): qty 100

## 2022-08-20 MED ORDER — CHLORHEXIDINE GLUCONATE 0.12 % MT SOLN
OROMUCOSAL | Status: AC
  Administered 2022-08-20: 15 mL via OROMUCOSAL
  Filled 2022-08-20: qty 15

## 2022-08-20 MED ORDER — LIDOCAINE HCL (CARDIAC) PF 100 MG/5ML IV SOSY
PREFILLED_SYRINGE | INTRAVENOUS | Status: DC | PRN
  Administered 2022-08-20: 50 mg via INTRATRACHEAL

## 2022-08-20 MED ORDER — ORAL CARE MOUTH RINSE: 15.0000 mL | Freq: Once | OROMUCOSAL | Status: AC

## 2022-08-20 MED ORDER — ZINC SULFATE 220 (50 ZN) MG PO CAPS: 220.0000 mg | ORAL_CAPSULE | Freq: Every day | ORAL | Status: DC

## 2022-08-20 MED ORDER — JUVEN PO PACK
1.0000 | PACK | Freq: Two times a day (BID) | ORAL | Status: DC
  Administered 2022-08-20 – 2022-08-21 (×3): 1 via ORAL
  Filled 2022-08-20 (×3): qty 1

## 2022-08-20 MED ORDER — MIDAZOLAM HCL 2 MG/2ML IJ SOLN
INTRAMUSCULAR | Status: AC
  Filled 2022-08-20: qty 2

## 2022-08-20 MED ORDER — ACETAMINOPHEN 325 MG PO TABS: 325.0000 mg | ORAL_TABLET | Freq: Four times a day (QID) | ORAL | Status: DC | PRN

## 2022-08-20 MED ORDER — ONDANSETRON HCL 4 MG/2ML IJ SOLN: 4.0000 mg | Freq: Four times a day (QID) | INTRAMUSCULAR | Status: DC | PRN

## 2022-08-20 MED ORDER — POTASSIUM CHLORIDE CRYS ER 20 MEQ PO TBCR: 20.0000 meq | EXTENDED_RELEASE_TABLET | Freq: Every day | ORAL | Status: DC | PRN

## 2022-08-20 MED ORDER — FENTANYL CITRATE (PF) 100 MCG/2ML IJ SOLN: 25.0000 ug | INTRAMUSCULAR | Status: DC | PRN

## 2022-08-20 MED ORDER — ONDANSETRON HCL 4 MG/2ML IJ SOLN
INTRAMUSCULAR | Status: AC
  Filled 2022-08-20: qty 2

## 2022-08-20 MED ORDER — OXYCODONE HCL 5 MG/5ML PO SOLN: 5.0000 mg | Freq: Once | ORAL | Status: DC | PRN

## 2022-08-20 MED ORDER — VASHE WOUND IRRIGATION OPTIME
TOPICAL | Status: DC | PRN
Start: 2022-08-20 — End: 2022-08-20
  Administered 2022-08-20: 34 [oz_av]

## 2022-08-20 MED ORDER — PANTOPRAZOLE SODIUM 40 MG PO TBEC
40.0000 mg | DELAYED_RELEASE_TABLET | Freq: Every day | ORAL | Status: DC
  Administered 2022-08-20 – 2022-08-21 (×2): 40 mg via ORAL
  Filled 2022-08-20 (×2): qty 1

## 2022-08-20 MED ORDER — 0.9 % SODIUM CHLORIDE (POUR BTL) OPTIME
TOPICAL | Status: DC | PRN
  Administered 2022-08-20: 1000 mL

## 2022-08-20 MED ORDER — OXYCODONE HCL 5 MG PO TABS: 5.0000 mg | ORAL_TABLET | ORAL | Status: DC | PRN

## 2022-08-20 MED ORDER — LACTATED RINGERS IV SOLN: INTRAVENOUS | Status: DC | PRN

## 2022-08-20 MED ORDER — ONDANSETRON HCL 4 MG/2ML IJ SOLN
INTRAMUSCULAR | Status: DC | PRN
Start: 2022-08-20 — End: 2022-08-20
  Administered 2022-08-20: 4 mg via INTRAVENOUS

## 2022-08-20 MED ORDER — FENTANYL CITRATE (PF) 250 MCG/5ML IJ SOLN
INTRAMUSCULAR | Status: DC | PRN
  Administered 2022-08-20 (×4): 50 ug via INTRAVENOUS

## 2022-08-20 MED ORDER — OXYCODONE HCL 5 MG PO TABS: 5.0000 mg | ORAL_TABLET | Freq: Once | ORAL | Status: DC | PRN

## 2022-08-20 MED ORDER — LABETALOL HCL 5 MG/ML IV SOLN: 10.0000 mg | INTRAVENOUS | Status: DC | PRN

## 2022-08-20 MED ORDER — FENTANYL CITRATE (PF) 250 MCG/5ML IJ SOLN
INTRAMUSCULAR | Status: AC
  Filled 2022-08-20: qty 5

## 2022-08-20 MED ORDER — POTASSIUM CHLORIDE 10 MEQ/100ML IV SOLN
10.0000 meq | INTRAVENOUS | Status: AC
  Administered 2022-08-20 (×5): 10 meq via INTRAVENOUS
  Filled 2022-08-20 (×4): qty 100

## 2022-08-20 MED ORDER — OXYCODONE HCL 5 MG PO TABS: 10.0000 mg | ORAL_TABLET | ORAL | Status: DC | PRN

## 2022-08-20 MED ORDER — IBUPROFEN 400 MG PO TABS
600.0000 mg | ORAL_TABLET | Freq: Once | ORAL | Status: AC
  Administered 2022-08-20: 600 mg via ORAL
  Filled 2022-08-20: qty 2

## 2022-08-20 MED ORDER — CHLORHEXIDINE GLUCONATE 0.12 % MT SOLN: 15.0000 mL | Freq: Once | OROMUCOSAL | Status: AC

## 2022-08-20 MED ORDER — MAGNESIUM CITRATE PO SOLN: 1.0000 | Freq: Once | ORAL | Status: DC | PRN

## 2022-08-20 MED ORDER — SUGAMMADEX SODIUM 200 MG/2ML IV SOLN
INTRAVENOUS | Status: DC | PRN
Start: 2022-08-20 — End: 2022-08-20
  Administered 2022-08-20: 150 mg via INTRAVENOUS

## 2022-08-20 MED ORDER — SODIUM CHLORIDE 0.9 % IV SOLN: INTRAVENOUS | Status: DC

## 2022-08-20 MED ORDER — POLYETHYLENE GLYCOL 3350 17 G PO PACK: 17.0000 g | PACK | Freq: Every day | ORAL | Status: DC | PRN

## 2022-08-20 MED ORDER — MAGNESIUM SULFATE IN D5W 1-5 GM/100ML-% IV SOLN
1.0000 g | Freq: Once | INTRAVENOUS | Status: AC
  Administered 2022-08-20: 1 g via INTRAVENOUS
  Filled 2022-08-20: qty 100

## 2022-08-20 MED ORDER — HYDRALAZINE HCL 20 MG/ML IJ SOLN: 5.0000 mg | INTRAMUSCULAR | Status: DC | PRN

## 2022-08-20 MED ORDER — BISACODYL 5 MG PO TBEC: 5.0000 mg | DELAYED_RELEASE_TABLET | Freq: Every day | ORAL | Status: DC | PRN

## 2022-08-20 MED ORDER — PROPOFOL 10 MG/ML IV BOLUS
INTRAVENOUS | Status: AC
  Filled 2022-08-20: qty 20

## 2022-08-20 MED ORDER — MAGNESIUM SULFATE 2 GM/50ML IV SOLN: 2.0000 g | Freq: Every day | INTRAVENOUS | Status: DC | PRN

## 2022-08-20 MED ORDER — GUAIFENESIN-DM 100-10 MG/5ML PO SYRP
15.0000 mL | ORAL_SOLUTION | ORAL | Status: DC | PRN
  Administered 2022-08-21 – 2022-08-24 (×2): 15 mL via ORAL
  Filled 2022-08-20 (×2): qty 15

## 2022-08-20 MED ORDER — SODIUM CHLORIDE 0.9% IV SOLUTION: Freq: Once | INTRAVENOUS | Status: DC

## 2022-08-20 MED ORDER — DEXAMETHASONE SODIUM PHOSPHATE 10 MG/ML IJ SOLN
INTRAMUSCULAR | Status: DC | PRN
  Administered 2022-08-20: 5 mg via INTRAVENOUS

## 2022-08-20 MED ORDER — DOCUSATE SODIUM 100 MG PO CAPS: 100.0000 mg | ORAL_CAPSULE | Freq: Every day | ORAL | Status: DC

## 2022-08-20 MED ORDER — SUGAMMADEX SODIUM 200 MG/2ML IV SOLN
INTRAVENOUS | Status: DC | PRN
Start: 2022-08-20 — End: 2022-08-20

## 2022-08-20 MED ORDER — PHENYLEPHRINE HCL (PRESSORS) 10 MG/ML IV SOLN
INTRAVENOUS | Status: DC | PRN
  Administered 2022-08-20: 160 ug via INTRAVENOUS
  Administered 2022-08-20 (×2): 80 ug via INTRAVENOUS

## 2022-08-20 MED ORDER — ROCURONIUM BROMIDE 10 MG/ML (PF) SYRINGE
PREFILLED_SYRINGE | INTRAVENOUS | Status: AC
  Filled 2022-08-20: qty 10

## 2022-08-20 MED ORDER — DEXAMETHASONE SODIUM PHOSPHATE 10 MG/ML IJ SOLN
INTRAMUSCULAR | Status: AC
  Filled 2022-08-20: qty 1

## 2022-08-20 MED ORDER — PHENOL 1.4 % MT LIQD: 1.0000 | OROMUCOSAL | Status: DC | PRN

## 2022-08-20 MED ORDER — LIDOCAINE 2% (20 MG/ML) 5 ML SYRINGE
INTRAMUSCULAR | Status: AC
  Filled 2022-08-20: qty 5

## 2022-08-20 MED ORDER — MIDAZOLAM HCL 2 MG/2ML IJ SOLN
INTRAMUSCULAR | Status: DC | PRN
Start: 2022-08-20 — End: 2022-08-20
  Administered 2022-08-20: 2 mg via INTRAVENOUS

## 2022-08-20 MED ORDER — GLYCOPYRROLATE PF 0.2 MG/ML IJ SOSY
PREFILLED_SYRINGE | INTRAMUSCULAR | Status: AC
  Filled 2022-08-20: qty 1

## 2022-08-20 MED ORDER — ALUM & MAG HYDROXIDE-SIMETH 200-200-20 MG/5ML PO SUSP: 15.0000 mL | ORAL | Status: DC | PRN

## 2022-08-20 MED ORDER — HYDROMORPHONE HCL 1 MG/ML IJ SOLN: 0.5000 mg | INTRAMUSCULAR | Status: DC | PRN

## 2022-08-20 SURGICAL SUPPLY — 48 items
ADH SKN CLS APL DERMABOND .7 (GAUZE/BANDAGES/DRESSINGS) ×4
BAG COUNTER SPONGE SURGICOUNT (BAG) IMPLANT
BAG SPNG CNTER NS LX DISP (BAG)
BLADE SURG 21 STRL SS (BLADE) ×1 IMPLANT
BNDG CMPR 5X6 CHSV STRCH STRL (GAUZE/BANDAGES/DRESSINGS)
BNDG COHESIVE 6X5 TAN ST LF (GAUZE/BANDAGES/DRESSINGS) IMPLANT
BNDG GAUZE DERMACEA FLUFF 4 (GAUZE/BANDAGES/DRESSINGS) ×2 IMPLANT
BNDG GZE DERMACEA 4 6PLY (GAUZE/BANDAGES/DRESSINGS)
CANISTER WOUNDNEG PRESSURE 500 (CANNISTER) IMPLANT
CLEANSER WND VASHE 34 (WOUND CARE) IMPLANT
CNTNR URN SCR LID CUP LEK RST (MISCELLANEOUS) IMPLANT
CONT SPEC 4OZ STRL OR WHT (MISCELLANEOUS) ×2
COVER SURGICAL LIGHT HANDLE (MISCELLANEOUS) ×2 IMPLANT
DERMABOND ADVANCED .7 DNX12 (GAUZE/BANDAGES/DRESSINGS) IMPLANT
DRAPE DERMATAC (DRAPES) IMPLANT
DRAPE INCISE 23X33 STRL (DRAPES) IMPLANT
DRAPE ORTHO SPLIT 77X108 STRL (DRAPES) ×1
DRAPE SURG ORHT 6 SPLT 77X108 (DRAPES) IMPLANT
DRAPE U-SHAPE 47X51 STRL (DRAPES) ×1 IMPLANT
DRESSING VERAFLO CLEANS CC MED (GAUZE/BANDAGES/DRESSINGS) IMPLANT
DRSG ADAPTIC 3X8 NADH LF (GAUZE/BANDAGES/DRESSINGS) ×1 IMPLANT
DRSG VERAFLO CLEANSE CC MED (GAUZE/BANDAGES/DRESSINGS) ×2
DURAPREP 26ML APPLICATOR (WOUND CARE) ×1 IMPLANT
ELECT REM PT RETURN 9FT ADLT (ELECTROSURGICAL) ×1
ELECTRODE REM PT RTRN 9FT ADLT (ELECTROSURGICAL) IMPLANT
GAUZE SPONGE 4X4 12PLY STRL (GAUZE/BANDAGES/DRESSINGS) ×1 IMPLANT
GLOVE BIOGEL PI IND STRL 9 (GLOVE) ×1 IMPLANT
GLOVE SURG ORTHO 9.0 STRL STRW (GLOVE) ×1 IMPLANT
GOWN STRL REUS W/ TWL XL LVL3 (GOWN DISPOSABLE) ×2 IMPLANT
GOWN STRL REUS W/TWL XL LVL3 (GOWN DISPOSABLE) ×2
GRAFT SKIN WND SURGICLOSE M95 (Tissue) IMPLANT
HANDPIECE INTERPULSE COAX TIP (DISPOSABLE)
KIT BASIN OR (CUSTOM PROCEDURE TRAY) ×1 IMPLANT
KIT TURNOVER KIT B (KITS) ×1 IMPLANT
MANIFOLD NEPTUNE II (INSTRUMENTS) ×1 IMPLANT
NS IRRIG 1000ML POUR BTL (IV SOLUTION) ×1 IMPLANT
PACK ORTHO EXTREMITY (CUSTOM PROCEDURE TRAY) ×1 IMPLANT
PAD ARMBOARD 7.5X6 YLW CONV (MISCELLANEOUS) ×2 IMPLANT
PAD NEG PRESSURE SENSATRAC (MISCELLANEOUS) IMPLANT
SET HNDPC FAN SPRY TIP SCT (DISPOSABLE) IMPLANT
STOCKINETTE IMPERVIOUS 9X36 MD (GAUZE/BANDAGES/DRESSINGS) IMPLANT
SUT ETHILON 2 0 PSLX (SUTURE) ×1 IMPLANT
SWAB COLLECTION DEVICE MRSA (MISCELLANEOUS) ×1 IMPLANT
SWAB CULTURE ESWAB REG 1ML (MISCELLANEOUS) IMPLANT
SYR BULB IRRIG 60ML STRL (SYRINGE) IMPLANT
TOWEL GREEN STERILE (TOWEL DISPOSABLE) ×1 IMPLANT
TUBE CONNECTING 12X1/4 (SUCTIONS) ×1 IMPLANT
YANKAUER SUCT BULB TIP NO VENT (SUCTIONS) ×1 IMPLANT

## 2022-08-20 NOTE — Progress Notes (Signed)
   08/20/22 1429  Assess: MEWS Score  Temp (!) 101.7 F (38.7 C)  O2 Device Room Air  Assess: MEWS Score  MEWS Temp 2  MEWS Systolic 0  MEWS Pulse 2  MEWS RR 0  MEWS LOC 0  MEWS Score 4  MEWS Score Color Red  Assess: if the MEWS score is Yellow or Red  Were vital signs accurate and taken at a resting state? Yes  Does the patient meet 2 or more of the SIRS criteria? No  Does the patient have a confirmed or suspected source of infection? Yes  MEWS guidelines implemented  No, previously red, continue vital signs every 4 hours  Notify: Charge Nurse/RN  Name of Charge Nurse/RN Notified Bryan  Assess: SIRS CRITERIA  SIRS Temperature  1  SIRS Pulse 1  SIRS Respirations  0  SIRS WBC 0  SIRS Score Sum  2

## 2022-08-20 NOTE — Progress Notes (Signed)
   08/20/22 1525  Assess: MEWS Score  Temp (!) 101.7 F (38.7 C)  BP (!) 108/56  MAP (mmHg) 71  Pulse Rate (!) 111  ECG Heart Rate (!) 111  Resp (!) 30  SpO2 95 %  O2 Device Room Air  Assess: MEWS Score  MEWS Temp 2  MEWS Systolic 0  MEWS Pulse 2  MEWS RR 2  MEWS LOC 0  MEWS Score 6  MEWS Score Color Red  Assess: if the MEWS score is Yellow or Red  Were vital signs accurate and taken at a resting state? Yes  Does the patient meet 2 or more of the SIRS criteria? Yes  Does the patient have a confirmed or suspected source of infection? Yes  MEWS guidelines implemented  No, previously red, continue vital signs every 4 hours  Notify: Charge Nurse/RN  Name of Charge Nurse/RN Notified Bryan  Assess: SIRS CRITERIA  SIRS Temperature  1  SIRS Pulse 1  SIRS Respirations  1  SIRS WBC 0  SIRS Score Sum  3

## 2022-08-20 NOTE — Anesthesia Preprocedure Evaluation (Signed)
Anesthesia Evaluation  Patient identified by MRN, date of birth, ID band Patient awake    Reviewed: Allergy & Precautions, H&P , NPO status , Patient's Chart, lab work & pertinent test results  Airway Mallampati: II   Neck ROM: full    Dental   Pulmonary former smoker   breath sounds clear to auscultation       Cardiovascular hypertension,  Rhythm:regular Rate:Normal     Neuro/Psych   Anxiety     Spina bifida.  paraplegic  Neuromuscular disease    GI/Hepatic   Endo/Other    Renal/GU      Musculoskeletal  (+) Arthritis ,    Abdominal   Peds  Hematology  (+) Blood dyscrasia, anemia Hemoglobin 7.2   Anesthesia Other Findings   Reproductive/Obstetrics                             Anesthesia Physical Anesthesia Plan  ASA: 3  Anesthesia Plan: General   Post-op Pain Management:    Induction: Intravenous  PONV Risk Score and Plan: 2 and Ondansetron, Dexamethasone, Midazolam and Treatment may vary due to age or medical condition  Airway Management Planned: Oral ETT  Additional Equipment:   Intra-op Plan:   Post-operative Plan: Extubation in OR  Informed Consent: I have reviewed the patients History and Physical, chart, labs and discussed the procedure including the risks, benefits and alternatives for the proposed anesthesia with the patient or authorized representative who has indicated his/her understanding and acceptance.     Dental advisory given  Plan Discussed with: CRNA, Anesthesiologist and Surgeon  Anesthesia Plan Comments:        Anesthesia Quick Evaluation

## 2022-08-20 NOTE — Progress Notes (Signed)
   08/20/22 1204  Assess: MEWS Score  Temp (!) 101 F (38.3 C)  BP 109/61  Pulse Rate (!) 117  Resp 18  SpO2 98 %  O2 Device Room Air  Patient Activity (if Appropriate) In bed  Assess: MEWS Score  MEWS Temp 1  MEWS Systolic 0  MEWS Pulse 2  MEWS RR 0  MEWS LOC 0  MEWS Score 3  MEWS Score Color Yellow  Assess: if the MEWS score is Yellow or Red  Were vital signs accurate and taken at a resting state? Yes  Does the patient meet 2 or more of the SIRS criteria? No  Does the patient have a confirmed or suspected source of infection? Yes  MEWS guidelines implemented  No, previously yellow, continue vital signs every 4 hours  Notify: Charge Nurse/RN  Name of Charge Nurse/RN Notified Bryan  Assess: SIRS CRITERIA  SIRS Temperature  1  SIRS Pulse 1  SIRS Respirations  0  SIRS WBC 0  SIRS Score Sum  2

## 2022-08-20 NOTE — Progress Notes (Signed)
Bedside report recived per day RN, pt was red mews with score of 6. DR. Laroy Apple was on the floor to see another patient, notified MD that pt's temp is still 103 after tylenol around 1711, patient denies any discomfort, soft BP. MD went to see the patient , stated will review and place some orders. Charge RN notified of red MEWS, vitals q 1 hr per protocol.See MAR for new orders. Plan of care continues.

## 2022-08-20 NOTE — Assessment & Plan Note (Addendum)
KUB 7/25 showed gaseous distention in both large and small intestines and large bladder stones. Patient had a second episode of emesis 7/26. 7/27 KUB showed nonobstructive bowel gas pattern, no pneumoperitoneum, and large bladder calculi. No more episodes of emesis and has been using the Compazine. - Monitor post surgical debridement - Simethicone 80mg  q6h PRN - Compazine 10 mg q6h PRN - Notify surgery if patient continues to have N/V

## 2022-08-20 NOTE — Assessment & Plan Note (Addendum)
Has received 4 units pRBCS during this admission. S/p IV Venofer 7/25. Stable today. Hgb 7.8>7.2 this AM. Patient received a transfusion after excisional debridement.  - Pending post-transfusion H&H - Requires blood from [place] (1 units in house). Type and Screen ordered 7/29. - AM CBC to monitor

## 2022-08-20 NOTE — Progress Notes (Signed)
OT Cancellation Note  Patient Details Name: Anthony [name] MRN: [id number] DOB: Jul 26, 1970   Cancelled Treatment:    Reason Eval/Treat Not Completed: Patient at procedure or test/ unavailable. Pt off unit in OR. OT will follow up next available time as appropriate  Galen Manila 08/20/2022, 9:31 AM

## 2022-08-20 NOTE — Progress Notes (Signed)
   08/20/22 0915  Assess: MEWS Score  Temp 98.5 F (36.9 C)  BP (!) 108/57  MAP (mmHg) 71  Pulse Rate (!) 105  ECG Heart Rate (!) 106  Resp (!) 23  Level of Consciousness Alert  SpO2 94 %  O2 Device Room Air  Assess: MEWS Score  MEWS Temp 0  MEWS Systolic 0  MEWS Pulse 1  MEWS RR 1  MEWS LOC 0  MEWS Score 2  MEWS Score Color Yellow  Assess: if the MEWS score is Yellow or Red  Were vital signs accurate and taken at a resting state? Yes  Does the patient meet 2 or more of the SIRS criteria? No  Does the patient have a confirmed or suspected source of infection? Yes  MEWS guidelines implemented  No, previously yellow, continue vital signs every 4 hours  Notify: Charge Nurse/RN  Name of Charge Nurse/RN Notified Bryan  Assess: SIRS CRITERIA  SIRS Temperature  0  SIRS Pulse 1  SIRS Respirations  1  SIRS WBC 0  SIRS Score Sum  2

## 2022-08-20 NOTE — Op Note (Signed)
08/20/2022  8:35 AM  PATIENT:  Anthony [name]    PRE-OPERATIVE DIAGNOSIS:  Abscess Bilateral Hips  POST-OPERATIVE DIAGNOSIS:  Same  PROCEDURE:  BILATERAL HIP EXCISIONAL DEBRIDEMENT, with excision skin and soft tissue muscle fascia and bone.  Tissue sent for separate cultures both hips.  Kerecis micro graft 95 cm to cover a wound surface area of greater than 100 cm.  Application of cleanse choice wound VAC sponges x 1 for each hip  SURGEON:  [name], MD  PHYSICIAN ASSISTANT:None ANESTHESIA:   General  PREOPERATIVE INDICATIONS:  Anthony [name] is a  52 y.o. male with a diagnosis of Abscess Bilateral Hips who failed conservative measures and elected for surgical management.    The risks benefits and alternatives were discussed with the patient preoperatively including but not limited to the risks of infection, bleeding, nerve injury, cardiopulmonary complications, the need for revision surgery, among others, and the patient was willing to proceed.  OPERATIVE IMPLANTS:   Implant Name Type Inv. Item Serial No. Manufacturer Lot No. LRB No. Used Action  GRAFT SKIN WND SURGICLOSE M95 - [id number] Tissue GRAFT SKIN WND SURGICLOSE M95  [employer] [id number] Bilateral 1 Implanted    @ENCIMAGES @  OPERATIVE FINDINGS: Patient had exposed bone in both hips tissue was sent for cultures bilaterally.  The 2 wounds on the left hip tunneled and communicated with each other.  Postdebridement wounds had healthy viable granulation tissue.  OPERATIVE PROCEDURE: Patient was brought the operating room and underwent a general anesthetic.  After adequate levels anesthesia were obtained both hips were prepped using DuraPrep draped into a sterile field with a shower curtain for both sides.  Attention was first focused on the right hip.  A 21 blade knife was used to ellipse out the large lateral wound this was carried down to bone.  Soft tissue and bone was sent for cultures.  The wound was debrided  back to healthy viable bleeding granulation tissue.  The wound was irrigated with Vashe.  Half of the 95 cm Kerecis micro graft was applied to a wound surface area of 75 cm.  This was then covered with a cleanse choice wound VAC sponge secured with Dermabond and derma tack and connected to the wound VAC.  Attention was then focused on the left lower extremity.  Patient had 2 communicating wounds over the hip.  Elliptical incision was made around the most lateral wound skin and soft tissue and bone was debrided back to healthy viable margins.  Electrocautery was used hemostasis.  The wound was irrigated with Vashe.  The wound was then filled with the remainder of the 95 cm Kerecis micro graft.  Wound surface area 75 cm.  This was then packed open with a cleanse choice wound VAC sponge secured with derma tack and Dermabond.  This was then connected to the wound VAC separate pump.  Skin and soft tissue muscle and fascia was sent for separate cultures from the left hip.  Patient was extubated taken to PACU in stable condition.   DISCHARGE PLANNING:  Antibiotic duration: Patient will need to continue antibiotics based on culture sensitivities.  Weightbearing: Not applicable  Pain medication: Opioid pathway ordered  Dressing care/ Wound VAC: Continue wound VAC bilaterally.  Ambulatory devices: Not applicable  Discharge to: Discharge planning based on therapy recommendations.  Follow-up: In the office 1 week post operative.

## 2022-08-20 NOTE — Progress Notes (Signed)
Transferred patient via bed to OR holding room 33. CCMD called and notified. Report called to Buck Mam CRNA. Pt's potassium was 3 this am, 6 runs of potassium was ordered per MD, reported to [place], tubing a bag of potassium to holding per CRNA request.

## 2022-08-20 NOTE — Progress Notes (Addendum)
Daily Progress Note Intern Pager: [phone]  Patient name: Anthony [name] Medical record number: [id number] Date of birth: Aug 01, 1970 Age: 52 y.o. Gender: male  Primary Care Provider: Erick Alley, DO Consultants: ID, Cardiology, Ortho, General Surgery  Code Status: Full Code   Pt Overview and Major Events to Date:  7/5: Admitted 7/6: Ortho consulted, stated no acute intervention indicated. ID consulted, recommended BSA bx 7/7: Hg dropped and patient received 1x pRBCs  7/8: Hg dropped again, 1x pRBCs, [name] ordered per ID recs  7/9: replaced catheter 7/12: received 1x pRBC 7/15: [name] - LVEF 55-60%, normal function 7/19: Surgery cancelled due to IT issues/outage 7/23: Diverting colostomy 7/25: Switched to PO abx - Ciprofloxacin and Doxycycline x10 days 7/26: Switched Lovenox to Eliquis 7/29: Switched back to IV abx - IV Daptomycin and IV Zosyn 7/30: Switched abx to Meropenem 7/31: Bilateral hip excisional debridement & culture + 1x pRBC  Assessment and Plan: Anthony [name] is a 52 yo M with PMHx of spina bfida with paraplegia and chronic sacral ulcers admitted for septic shock 2/2 to infected sacral ulcers and osteomyelitis. Patient is s/p diverting colostomy on 7/23.  [facility]     * (Principal) Polymicrobial bacterial infection     IV Cefepime (7/5 - 7/16) & Flagyl (7/5 -7/16) completed. 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 Diverting colostomy on 7/23. Dr.  [name] agreed that hydrotherapy would be a better therapeutic option at this  time vs surgical debridement, however PT doesn't believe his wounds are  amenable to hydrotherapy due to deep purulent discharge. Discontinued IV  Daptomycin (7/5-7/25) and Zosyn (7/18-7/25). Concern for ileus d/t  intermittent episodes of N/V s/p colostomy, but has improved since.  Switched from PO abx to IV Daptomycin and Zosyn (7/29) due to concern for  lack of source control. - Continue IV Daptomycin and Meropenem  (7/30-)  - NS 75 mL/hr, will reassess need this PM based on PO intake - Reevaluate IV abx s/p surgical debridement per Dr. [name] 7/31 to  determine what source control is like - Pain regimen: Tylenol 1000 mg q6h, Oxycodone 5-10 mg q4h for moderate  pain, and Oxycodone 10-15 mg q4 PRN for severe pain + Gabapentin 100 TID - Restart Eliquis 8/1 per ortho - BCID: strep spp + proteus previously, no growth in 2 days - Surgical cultures pending        Acute Right shoulder pain     XR negative for fracture. Improving with PT. - PT following - Multimodal pain management - Roxabin, Voltaren, Lidoderm, Muscle rub  cream, and PRN Tylenol        Decubitus ulcer due to spina bifida (HCC)     Transitioned to Ciprofloxacin and Doxycycline for 14 days (7/25-8/7).  PT shared that due to his deep purulent discharge, he is not a great  candidate for hydrotherapy and ortho will do surgical debridement on 7/31. - Consulted ortho (Dr. [name]) - excisional debridement today and cultures  pending - PT on board - Wound care on board, discussed to address foot wounds as well - ID signed off, will consult if patient not responsive to Abx regimen         Hypokalemia     K+ 3.0 being repleted with 6 runs of IV K+. Was held during prior to  procedure per CRNA request. - Complete repletion - Recheck AM BMP        Anemia of infection and  chronic disease     Has received 4 units pRBCS during this admission. S/p IV Venofer 7/25.  Stable today. Hgb 7.8>7.2 this AM. Patient received a transfusion after  excisional debridement.  - Pending post-transfusion H&H - Requires blood from [place] (1 units in house). Type and Screen  ordered 7/29. - AM CBC to monitor        Acute deep vein thrombosis (DVT) of axillary vein of right upper  extremity (HCC)     DVT noted on R upper extremity. Patient R arm is not swollen and  nontender. - On Eliquis for 3 months (7/26), currently being held post surgery d/t  blood  loss and risk of bleeding, will restart 8/1 - Continue to monitor        Nausea & vomiting     KUB 7/25 showed gaseous distention in both large and small intestines  and large bladder stones. Patient had a second episode of emesis 7/26.  7/27 KUB showed nonobstructive bowel gas pattern, no pneumoperitoneum, and  large bladder calculi. No more episodes of emesis and has been using the  Compazine. - Monitor post surgical debridement - Simethicone 80mg  q6h PRN - Compazine 10 mg q6h PRN - Notify surgery if patient continues to have N/V         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     Post-op day 5 diverting colostomy by Dr. Dwain Sarna on 7/23. Stoma  clean, pink, with stool and air in ostomy bag. No more episodes of emesis. - Soft diet today - Ostomy bridge - removed around 10 days post-op per GI (8/2) - Bowel regimen: Simethicone 80 mg q6h PRN, Senna 8.6 mg daily, Psyllium 1  packet daily, and Lactulose 10 g BID. 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  He describes that he does not feel complete like himself right now and has no appetite right now.  Still receiving blood and is tachycardic.  No complaints of pain.  Good stool output.  Objective: Temp:  [98 F (36.7 C)-103 F (39.4 C)] 101 F (38.3 C) (07/31 1204) Pulse Rate:  [86-133] 117 (07/31 1204) Resp:  [15-26] 18 (07/31 1204) BP: (94-126)/(53-71) 109/61 (07/31 1204) SpO2:  [90 %-100 %] 98 % (07/31 1204) Weight:  [75.3 kg] 75.3 kg  (07/31 0557) Physical Exam: General: Awake and alert. NAD. Cardiovascular: RRR. No M/R/G. Respiratory: CTAB. Normal WOB on RA. No wheezing, crackles, or rhonchi Abdomen: soft, non-tender, non-distended. Normotensive bowel sounds. Stoma with stool in ostomy bag.   Laboratory: Most recent CBC Lab Results  Component Value Date   WBC 10.4 08/20/2022   HGB 7.2 (L) 08/20/2022   HCT 24.3 (L) 08/20/2022   MCV 78.9 (L) 08/20/2022   PLT 410 (H) 08/20/2022   Most recent BMP    Latest Ref Rng & Units 08/20/2022    5:20 AM  BMP  Glucose 70 - 99 mg/dL 161   BUN 6 - 20 mg/dL 13   Creatinine  0.61 - 1.24 mg/dL 1.61   Sodium 096 - 045 mmol/L 138   Potassium 3.5 - 5.1 mmol/L 3.0   Chloride 98 - 111 mmol/L 102   CO2 22 - 32 mmol/L 25   Calcium 8.9 - 10.3 mg/dL 8.7     Imaging/Diagnostic Tests: No new imaging.  [name], DO 08/20/2022, 1:18 PM PGY-1, [facility]  FPTS Intern pager: [phone], text pages welcome Secure chat group [facility] Teaching Service

## 2022-08-20 NOTE — Assessment & Plan Note (Addendum)
Transitioned to Ciprofloxacin and Doxycycline for 14 days (7/25-8/7). PT shared that due to his deep purulent discharge, he is not a great candidate for hydrotherapy and ortho will do surgical debridement on 7/31. - Consulted ortho (Dr. [name]) - excisional debridement today and cultures pending - PT on board - Wound care on board, discussed to address foot wounds as well - ID signed off, will consult if patient not responsive to Abx regimen

## 2022-08-20 NOTE — Plan of Care (Signed)

## 2022-08-20 NOTE — Transfer of Care (Signed)
Immediate Anesthesia Transfer of Care Note  Patient: Anthony [name]  Procedure(s) Performed: BILATERAL HIP DEBRIDEMENT (Bilateral: Hip)  Patient Location: PACU  Anesthesia Type:General  Level of Consciousness: awake, drowsy, and patient cooperative  Airway & Oxygen Therapy: Patient Spontanous Breathing and Patient connected to face mask oxygen  Post-op Assessment: Report given to RN, Post -op Vital signs reviewed and stable, and Patient moving all extremities X 4  Post vital signs: Reviewed and stable  Last Vitals:  Vitals Value Taken Time  BP 120/71 08/20/22 0833  Temp    Pulse 110 08/20/22 0838  Resp 17 08/20/22 0838  SpO2 99 % 08/20/22 0838  Vitals shown include unfiled device data.  Last Pain:  Vitals:   08/20/22 0704  TempSrc: Oral  PainSc: 0-No pain      Patients Stated Pain Goal: 0 (08/17/22 0543)  Complications: No notable events documented.

## 2022-08-20 NOTE — Assessment & Plan Note (Signed)
Post-op day 5 diverting colostomy by Dr. Dwain Sarna on 7/23. Stoma clean, pink, with stool and air in ostomy bag. No more episodes of emesis. - Soft diet today - Ostomy bridge - removed around 10 days post-op per GI (8/2) - Bowel regimen: Simethicone 80 mg q6h PRN, Senna 8.6 mg daily, Psyllium 1 packet daily, and Lactulose 10 g BID. - Pain regimen: per ortho s/p debridement + Gabapentin 100 mg TID - Replete Mag and K as indicated (Mg > 2, K > 4)

## 2022-08-20 NOTE — Assessment & Plan Note (Signed)
XR negative for fracture. Improving with PT. - PT following - Multimodal pain management - Roxabin, Voltaren, Lidoderm, Muscle rub cream, and PRN Tylenol

## 2022-08-20 NOTE — Progress Notes (Signed)
   08/20/22 1125  Assess: MEWS Score  Temp (!) 100.5 F (38.1 C)  BP 113/62  MAP (mmHg) 78  Pulse Rate (!) 113  ECG Heart Rate (!) 113  Resp 18  Level of Consciousness Alert  SpO2 95 %  O2 Device Room Air  Patient Activity (if Appropriate) In bed  Assess: MEWS Score  MEWS Temp 1  MEWS Systolic 0  MEWS Pulse 2  MEWS RR 0  MEWS LOC 0  MEWS Score 3  MEWS Score Color Yellow  Assess: if the MEWS score is Yellow or Red  Were vital signs accurate and taken at a resting state? Yes  Does the patient meet 2 or more of the SIRS criteria? No  Does the patient have a confirmed or suspected source of infection? Yes  MEWS guidelines implemented  No, previously yellow, continue vital signs every 4 hours  Notify: Charge Nurse/RN  Name of Charge Nurse/RN Notified Bryan  Assess: SIRS CRITERIA  SIRS Temperature  0  SIRS Pulse 1  SIRS Respirations  0  SIRS WBC 0  SIRS Score Sum  1

## 2022-08-20 NOTE — Anesthesia Procedure Notes (Signed)
Procedure Name: Intubation Date/Time: 08/20/2022 7:37 AM  Performed by: Salomon Mast, CRNAPre-anesthesia Checklist: Patient identified, Emergency Drugs available, Suction available and Patient being monitored Patient Re-evaluated:Patient Re-evaluated prior to induction Oxygen Delivery Method: Circle system utilized Preoxygenation: Pre-oxygenation with 100% oxygen Induction Type: IV induction Ventilation: Oral airway inserted - appropriate to patient size and Mask ventilation with difficulty Laryngoscope Size: Mac and 3 Grade View: Grade I Tube type: Oral Tube size: 7.0 mm Number of attempts: 1 Placement Confirmation: ETT inserted through vocal cords under direct vision, positive ETCO2 and breath sounds checked- equal and bilateral Secured at: 23 cm Tube secured with: Tape Dental Injury: Teeth and Oropharynx as per pre-operative assessment

## 2022-08-20 NOTE — Anesthesia Postprocedure Evaluation (Signed)
Anesthesia Post Note  Patient: Anthony [name]  Procedure(s) Performed: BILATERAL HIP DEBRIDEMENT (Bilateral: Hip)     Patient location during evaluation: PACU Anesthesia Type: General Level of consciousness: awake and alert Pain management: pain level controlled Vital Signs Assessment: post-procedure vital signs reviewed and stable Respiratory status: spontaneous breathing, nonlabored ventilation, respiratory function stable and patient connected to nasal cannula oxygen Cardiovascular status: blood pressure returned to baseline and stable Postop Assessment: no apparent nausea or vomiting Anesthetic complications: no   No notable events documented.  Last Vitals:  Vitals:   08/20/22 1419 08/20/22 1429  BP: (!) 106/58   Pulse:    Resp:    Temp: (!) 38.7 C (!) 38.7 C  SpO2:      Last Pain:  Vitals:   08/20/22 1427  TempSrc:   PainSc: 0-No pain                 Leotha Westermeyer S

## 2022-08-20 NOTE — Interval H&P Note (Signed)
History and Physical Interval Note:  08/20/2022 6:31 AM  Anthony [name]  has presented today for surgery, with the diagnosis of Abscess Bilateral Hips.  The various methods of treatment have been discussed with the patient and family. After consideration of risks, benefits and other options for treatment, the patient has consented to  Procedure(s): BILATERAL HIP DEBRIDEMENT (Bilateral) as a surgical intervention.  The patient's history has been reviewed, patient examined, no change in status, stable for surgery.  I have reviewed the patient's chart and labs.  Questions were answered to the patient's satisfaction.     [name]

## 2022-08-20 NOTE — Progress Notes (Signed)
   08/20/22 1623  Assess: MEWS Score  Temp 99.6 F (37.6 C)  Level of Consciousness Alert  Assess: MEWS Score  MEWS Temp 0  MEWS Systolic 0  MEWS Pulse 2  MEWS RR 2  MEWS LOC 0  MEWS Score 4  MEWS Score Color Red  Assess: if the MEWS score is Yellow or Red  Were vital signs accurate and taken at a resting state? Yes  Does the patient meet 2 or more of the SIRS criteria? Yes  Does the patient have a confirmed or suspected source of infection? Yes  MEWS guidelines implemented  No, previously red, continue vital signs every 4 hours  Assess: SIRS CRITERIA  SIRS Temperature  0  SIRS Pulse 1  SIRS Respirations  1  SIRS WBC 0  SIRS Score Sum  2

## 2022-08-20 NOTE — Assessment & Plan Note (Signed)
K+ repleted 7/31, improved to 3.6 this AM

## 2022-08-20 NOTE — Assessment & Plan Note (Addendum)
IV Cefepime (7/5 - 7/16) & Flagyl (7/5 -7/16) completed. 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 Diverting colostomy on 7/23. Dr. [name] agreed that hydrotherapy would be a better therapeutic option at this time vs surgical debridement, however PT doesn't believe his wounds are amenable to hydrotherapy due to deep purulent discharge. Discontinued IV Daptomycin (7/5-7/25) and Zosyn (7/18-7/25). Concern for ileus d/t intermittent episodes of N/V s/p colostomy, but has improved since. Switched from PO abx to IV Daptomycin and Zosyn (7/29) due to concern for lack of source control. - Continue IV Daptomycin and Meropenem (7/30-)  - NS 75 mL/hr, will reassess need this PM based on PO intake - Reevaluate IV abx s/p surgical debridement per Dr. [name] 7/31 to determine what source control is like - Pain regimen: Tylenol 1000 mg q6h, Oxycodone 5-10 mg q4h for moderate pain, and Oxycodone 10-15 mg q4 PRN for severe pain + Gabapentin 100 TID - Restart Eliquis 8/1 per ortho - BCID: strep spp + proteus previously, no growth in 2 days - Surgical cultures pending

## 2022-08-20 NOTE — Plan of Care (Signed)
  Problem: Education: Goal: Knowledge of General Education information will improve Description: Including pain rating scale, medication(s)/side effects and non-pharmacologic comfort measures Outcome: Progressing   Problem: Activity: Goal: Risk for activity intolerance will decrease Outcome: Progressing   Problem: Nutrition: Goal: Adequate nutrition will be maintained Outcome: Progressing   Problem: Coping: Goal: Level of anxiety will decrease Outcome: Progressing   

## 2022-08-20 NOTE — Progress Notes (Addendum)
FMTS Brief Progress Note  S: Patient seen at bedside with Dr. Laroy Apple.  He underwent lateral hip debridement earlier today.  He states he is feeling better than he has been recently.  He had a good appetite and ate dinner earlier.  He denies pain at this time.  States he had a fever earlier today but this is resolved and he feels well.   O: BP 100/63 (BP Location: Right Leg)   Pulse 95   Temp 98.8 F (37.1 C) (Oral)   Resp 20   Ht 5\' 8"  (1.727 m)   Wt 75.3 kg   SpO2 95%   BMI 25.24 kg/m   Wound VAC in place with output 50 cc serosanguineous fluid. General: Patient appears to be doing better than on previous exams, pleasant, no acute distress.  Afebrile. Heart: RRR Lungs: CTA bilaterally.  No increased work of breathing.   Abdomen: Normoactive bowel sounds.  Soft, nontender.  Stoma with ostomy bag in place.  A/P: Received 2 units of blood earlier today.  There was concern for possible transfusion reaction due to patient fever during transfusion; transfusion reaction workup is pending.  At this time patient appears very well, hemodynamically stable. - Follow-up on transfusion reaction protocol/workup - Orders reviewed. Labs for AM ordered, which was adjusted as needed.  - If condition changes, plan includes reevaluation, consider additional 500 cc fluid bolus and spot ibuprofen as needed for fevers.  Cyndia Skeeters, DO 08/20/2022, 8:19 PM PGY-1, [facility] Night Resident  Please page [phone] with questions.

## 2022-08-21 ENCOUNTER — Encounter (HOSPITAL_COMMUNITY): Payer: Self-pay | Admitting: Orthopedic Surgery

## 2022-08-21 DIAGNOSIS — Q059 Spina bifida, unspecified: Secondary | ICD-10-CM | POA: Diagnosis not present

## 2022-08-21 DIAGNOSIS — A419 Sepsis, unspecified organism: Secondary | ICD-10-CM | POA: Diagnosis not present

## 2022-08-21 DIAGNOSIS — A499 Bacterial infection, unspecified: Secondary | ICD-10-CM | POA: Diagnosis not present

## 2022-08-21 DIAGNOSIS — G8220 Paraplegia, unspecified: Secondary | ICD-10-CM | POA: Diagnosis not present

## 2022-08-21 DIAGNOSIS — L89159 Pressure ulcer of sacral region, unspecified stage: Secondary | ICD-10-CM | POA: Diagnosis not present

## 2022-08-21 MED ORDER — SALINE SPRAY 0.65 % NA SOLN
1.0000 | NASAL | Status: DC | PRN
  Administered 2022-08-21: 1 via NASAL
  Filled 2022-08-21: qty 44

## 2022-08-21 MED ORDER — ACETAMINOPHEN 325 MG PO TABS
650.0000 mg | ORAL_TABLET | Freq: Four times a day (QID) | ORAL | Status: DC | PRN
  Administered 2022-08-21 – 2022-08-29 (×9): 650 mg via ORAL
  Filled 2022-08-21 (×9): qty 2

## 2022-08-21 MED ORDER — VASHE WOUND IRRIGATION OPTIME
34.0000 [oz_av] | Freq: Once | TOPICAL | Status: DC
  Filled 2022-08-21 (×2): qty 34

## 2022-08-21 MED ORDER — APIXABAN 5 MG PO TABS
5.0000 mg | ORAL_TABLET | Freq: Two times a day (BID) | ORAL | Status: DC
  Administered 2022-08-21 – 2022-09-01 (×23): 5 mg via ORAL
  Filled 2022-08-21 (×23): qty 1

## 2022-08-21 NOTE — Progress Notes (Addendum)
Abx: Dapto/meropenem  A/p: Sacral ulcer associated sepsis Polymicrobial bsi s/p treatment; 7/5-25 dapto  Ongoing fever sepsis, and taken to or for debridement and escalated abx to dapto and meropenem  7/31 bcx ngtd  Can continue current abx for now Awaiting OR culture   Most cases due to source control, but will see if presence of resistant organism plays a role  F/u cultures   -------------- Subjective: Feels the same as I last visited him last week. S/p I&D of sacral ulcer 7/31 Colostomy output stable No n/v    Objective: Vitals:   08/21/22 0304 08/21/22 0843  BP: 120/66 132/68  Pulse: 94 99  Resp: 20 20  Temp: 98.7 F (37.1 C) 98.3 F (36.8 C)  SpO2: 98% 96%   General/constitutional: no distress, pleasant HEENT: Normocephalic, PER, Conj Clear, EOMI, Oropharynx clear Neck supple CV: rrr no mrg Lungs: clear to auscultation, normal respiratory effort Abd: colostomy functioning Ext/msk: atrophic bilateral LE and paraplegia     Labs: Lab Results  Component Value Date   WBC 10.9 (H) 08/21/2022   HGB 7.5 (L) 08/21/2022   HCT 25.0 (L) 08/21/2022   MCV 79.1 (L) 08/21/2022   PLT 470 (H) 08/21/2022   Last metabolic panel Lab Results  Component Value Date   GLUCOSE 129 (H) 08/21/2022   NA 135 08/21/2022   K 3.6 08/21/2022   CL 104 08/21/2022   CO2 24 08/21/2022   BUN 15 08/21/2022   CREATININE 0.41 (L) 08/21/2022   GFRNONAA >60 08/21/2022   CALCIUM 7.9 (L) 08/21/2022   PHOS 2.2 (L) 09/06/2021   PROT 6.6 08/21/2022   ALBUMIN 1.7 (L) 08/21/2022   LABGLOB 3.7 03/26/2021   AGRATIO 1.2 03/26/2021   BILITOT 0.2 (L) 08/21/2022   ALKPHOS 123 08/21/2022   AST 23 08/21/2022   ALT 18 08/21/2022   ANIONGAP 7 08/21/2022   Imaging: Reviewed 7/31 cxr no active disease

## 2022-08-21 NOTE — Progress Notes (Signed)
Wound vac to left hip was constantly showing blockage and beeping, pressure was barely at 25 and it was supposed to be at 150 continuous. Tried to fix the blockage, but unsuccessful. Vac dressing removed, site cleaned with Vashe, packed with wet to dry Vashe dressing to hip wound x 2. Covered with 4x4, abd pad and tape. Patient tolerated well, plan of care continues.

## 2022-08-21 NOTE — Progress Notes (Signed)
Daily Progress Note Intern Pager: [phone]  Patient name: Anthony [name] Medical record number: [id number] Date of birth: May 08, 1970 Age: 52 y.o. Gender: male  Primary Care Provider: Erick Alley, DO Consultants: ID, ortho, gen surg Code Status: Full code  Pt Overview and Major Events to Date:  7/5: Admitted 7/6: Ortho consulted, stated no acute intervention indicated. ID consulted, recommended BSA bx 7/7: Hg dropped and patient received 1x pRBCs  7/8: Hg dropped again, 1x pRBCs, [name] ordered per ID recs  7/9: replaced catheter 7/12: received 1x pRBC 7/15: [name] - LVEF 55-60%, normal function 7/19: Surgery cancelled due to IT issues/outage 7/23: Diverting colostomy 7/25: Switched to PO abx - Ciprofloxacin and Doxycycline x10 days 7/26: Switched Lovenox to Eliquis 7/29: Switched back to IV abx - IV Daptomycin and IV Zosyn 7/30: Switched abx to Meropenem 7/31: Bilateral hip excisional debridement & culture + 1x pRBC  Assessment and Plan: Anthony [name] is a 52 yo M with a past medical history of spina bifida with paraplegia and chronic sacral ulcers admitted for septic shock secondary to infected sacral ulcers and osteomyelitis. Patient is s/p diverting colostomy on 7/23 and BL hip debridement on 7/31. [facility]     * (Principal) Polymicrobial bacterial infection     IV Cefepime (7/5 - 7/16) & Flagyl (7/5 -7/16) completed. 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 Diverting colostomy on 7/23. Dr.  [name] agreed that hydrotherapy would be a better therapeutic option at this  time vs surgical debridement, however PT doesn't believe his wounds are  amenable to hydrotherapy due to deep purulent discharge. Discontinued IV  Daptomycin (7/5-7/25) and Zosyn (7/18-7/25). Concern for ileus d/t  intermittent episodes of N/V s/p colostomy, but has improved since.  Switched from PO abx to IV Daptomycin and Zosyn (7/29) due to concern for  lack of source control. -  Continue IV Daptomycin and Meropenem (7/30-)  - d/c IV fluids, doing well with PO - Reevaluate IV abx s/p surgical debridement per Dr. [name] 7/31 to  determine what source control is like - Pain regimen: Tylenol 1000 mg q6h, Oxycodone 5-10 mg q4h for moderate  pain, and Oxycodone 10-15 mg q4 PRN for severe pain + Gabapentin 100 TID - Restart Eliquis 5mg  BID 8/1 per ortho - BCID: strep spp + proteus previously, no growth in 2 days - Surgical cultures pending. No growth in 24hrs from BL hip wound cultures        Acute Right shoulder pain     XR negative for fracture. Improving with PT. - PT following - Multimodal pain management - Roxabin, Voltaren, Lidoderm, Muscle rub  cream, and PRN Tylenol        Decubitus ulcer due to spina bifida (HCC)     Transitioned to Ciprofloxacin and Doxycycline for 14 days (7/25-8/7).  PT shared that due to his deep purulent discharge, he is not a great  candidate for hydrotherapy. S/p surgical debridement with ortho on 7/31. - Per ortho (Dr. [name]) - Difficulty with the wound VAC seal and drainage  overnight 7/31. Will d/c wound vac and switch to wet to dry dressings - PT on board - Wound care on board, discussed to address foot wounds as well - ID signed off, will consult if patient not responsive to Abx regimen         Hypokalemia     K+ repleted 7/31, improved to 3.6 this AM  Anemia of infection and chronic disease     Has received 4 units pRBCS during this admission. S/p IV Venofer 7/25.  Stable today. Hgb 7.8>7.2 this AM. Patient received a transfusion after  excisional debridement.  - Pending post-transfusion H&H - Requires blood from [place] (1 units in house). Type and Screen  ordered 7/29. - Hgb 7.5 on AM CBC, no signs or symptoms of acute blood loss -if Hgb declines further and requires additional transfusions, consider  d/c'ing Eliquis        Acute deep vein thrombosis (DVT) of axillary vein of right upper  extremity (HCC)      DVT noted on R upper extremity. Patient R arm is not swollen and  nontender. - On Eliquis 5mg  BID for 3 months (7/26), held post surgery d/t blood loss  and risk of bleeding. Will restart today 8/1 - Continue to monitor        Nausea & vomiting     KUB 7/25 showed gaseous distention in both large and small intestines  and large bladder stones. Patient had a second episode of emesis 7/26.  7/27 KUB showed nonobstructive bowel gas pattern, no pneumoperitoneum, and  large bladder calculi. No more episodes of emesis and has been using the  Compazine. - Monitor post surgical debridement - Simethicone 80mg  q6h PRN - Compazine 10 mg q6h PRN - Notify surgery if patient continues to have N/V         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     Post-op day 5 diverting colostomy by Dr. Dwain Sarna on 7/23. Stoma  clean, pink, with stool and air in ostomy bag. No more episodes of emesis. - Soft diet today - Ostomy bridge - removed around 10 days post-op per GI (8/2) - Bowel regimen: Simethicone 80 mg q6h PRN, Senna 8.6 mg daily, Psyllium 1  packet daily, and Lactulose 10 g BID. 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 States pain in R hip has mostly subsided. Denies fevers and chills. States he has been having some cold-like symptoms and wonders if that caused his fever yesterday.   Objective: Temp:  [98.3 F (36.8 C)-101.7 F (38.7 C)] 98.4 F (36.9 C) (08/01 1241) Pulse Rate:  [92-111] 101 (08/01 1241) Resp:  [14-30] 20 (08/01 1241) BP: (100-132)/(56-71) 118/60 (08/01 1241) SpO2:  [95 %-99 %] 97 % (08/01 1241) Weight:  [78.5 kg] 78.5 kg  (08/01 0500) Physical Exam: General: Awake and alert. NAD Cardiovascular: RRR, no murmurs, rubs, or gallops Respiratory: CTAB, no wheezes rales, or rhonchi Abdomen: soft, nontender, nondistended. Stoma with stool in ostomy bag Extremities: no swelling or tenderness at RUE. Capillary refill <2 seconds   Laboratory: Most recent CBC Lab Results  Component Value Date   WBC 10.9 (H) 08/21/2022   HGB 7.5 (L) 08/21/2022   HCT 25.0 (L) 08/21/2022   MCV 79.1 (L) 08/21/2022   PLT 470 (H) 08/21/2022   Most recent BMP    Latest Ref Rng & Units 08/21/2022    6:25 AM  BMP  Glucose 70 - 99 mg/dL 578   BUN  6 - 20 mg/dL 15   Creatinine 5.28 - 1.24 mg/dL 4.13   Sodium 244 - 010 mmol/L 135   Potassium 3.5 - 5.1 mmol/L 3.6   Chloride 98 - 111 mmol/L 104   CO2 22 - 32 mmol/L 24   Calcium 8.9 - 10.3 mg/dL 7.9     Other pertinent labs: Mg 2.2  Imaging/Diagnostic Tests: Portable chest x-ray 7/31 at 14:20 Radiologist Impression: no pneumothorax, acute airspace disease or effusion My interpretation: normal CXR Lorayne Bender, MD 08/21/2022, 12:54 PM  PGY-1, [facility] FPTS Intern pager: [phone], text pages welcome Secure chat group [facility] Teaching Service

## 2022-08-21 NOTE — Progress Notes (Signed)
Physical Therapy Treatment Patient Details Name: Anthony [name] MRN: [id number] DOB: Jun 08, 1970 Today's Date: 08/21/2022   History of Present Illness Pt is a 52 y.o. male who presented 07/25/22 with weakness and a fall. Pt admitted with severe sepsis secondary to sacral decubitus ulcer infection with concern for hip septic arthritis noted on CT. Imaging of R shoulder showed moderate acromioclavicular OA & mild distal lasteral subacromial spurring. S/p colostomy 7/23. Pt now bilateral hip excisional debridement, with excision skin and soft tissue muscle fascia and bone on 7/31. PMH: complete paraplegia, multiple toe amputations, chronic sacral wounds, neurogenic bladder with suprapubic catheter, spina bifida, HTN    PT Comments  Pt seen for mobility progression, now s/p bilateral hip excisional debridement, with excision skin and soft tissue muscle fascia and bone on 7/31. Pt with no reports of pain today. Pt continues to require physical assistance for bed mobility and able to tolerate upright sitting at EOB for ~15 mins with supervision for safety. Pt would continue to benefit from skilled physical therapy services at this time while admitted and after d/c to address the below listed limitations in order to improve overall safety and independence with functional mobility.      If plan is discharge home, recommend the following: Two people to help with walking and/or transfers;A lot of help with bathing/dressing/bathroom;Assistance with cooking/housework;Assist for transportation;Help with stairs or ramp for entrance   Can travel by private vehicle        Equipment Recommendations  Hospital bed    Recommendations for Other Services       Precautions / Restrictions Precautions Precautions: Fall;Other (comment) Precaution Comments: paraplegia, colostomy Restrictions Weight Bearing Restrictions: No     Mobility  Bed Mobility Overal bed mobility: Needs Assistance Bed Mobility: Supine to  Sit, Sit to Supine, Rolling Rolling: Mod assist, +2 for physical assistance   Supine to sit: Mod assist, +2 for physical assistance Sit to supine: Mod assist, +2 for physical assistance   General bed mobility comments: Assist needed for trunk elevation to achieve an upright sitting position at EOB; assist needed for bilateral LE management off of and back onto bed    Transfers                   General transfer comment: pt deferring    Ambulation/Gait                   Stairs             Wheelchair Mobility     Tilt Bed    Modified Rankin (Stroke Patients Only)       Balance Overall balance assessment: Needs assistance Sitting-balance support: Feet supported, No upper extremity supported Sitting balance-Leahy Scale: Fair Sitting balance - Comments: pt able to sit EOB for ~15 minutes with supervision for safety while participating in a bath with his nurse tech assisting. pt able to weight shift in all directions and recover to midline independently with no LOB                                    Cognition Arousal/Alertness: Awake/alert Behavior During Therapy: WFL for tasks assessed/performed Overall Cognitive Status: Within Functional Limits for tasks assessed  Exercises      General Comments        Pertinent Vitals/Pain Pain Assessment Pain Assessment: No/denies pain Pain Intervention(s): Monitored during session    Home Living                          Prior Function            PT Goals (current goals can now be found in the care plan section) Acute Rehab PT Goals PT Goal Formulation: With patient Time For Goal Achievement: 08/27/22 Potential to Achieve Goals: Good Progress towards PT goals: Progressing toward goals    Frequency    Min 1X/week      PT Plan Current plan remains appropriate    Co-evaluation              AM-PAC PT  "6 Clicks" Mobility   Outcome Measure  Help needed turning from your back to your side while in a flat bed without using bedrails?: A Little Help needed moving from lying on your back to sitting on the side of a flat bed without using bedrails?: A Lot Help needed moving to and from a bed to a chair (including a wheelchair)?: A Lot Help needed standing up from a chair using your arms (e.g., wheelchair or bedside chair)?: Total Help needed to walk in hospital room?: Total Help needed climbing 3-5 steps with a railing? : Total 6 Click Score: 10    End of Session   Activity Tolerance: Patient tolerated treatment well Patient left: in bed;with call bell/phone within reach;with nursing/sitter in room;Other (comment) (NT and OT present at end of session) Nurse Communication: Mobility status PT Visit Diagnosis: Muscle weakness (generalized) (M62.81);Pain Pain - Right/Left: Right Pain - part of body: Shoulder     Time: 1610-9604 PT Time Calculation (min) (ACUTE ONLY): 33 min  Charges:    $Therapeutic Activity: 23-37 mins PT General Charges $$ ACUTE PT VISIT: 1 Visit                     Anthony [name], DPT  Acute Rehabilitation Services Office [phone]    Anthony [name] 08/21/2022, 12:23 PM

## 2022-08-21 NOTE — Plan of Care (Signed)
  Problem: Education: Goal: Knowledge of General Education information will improve Description: Including pain rating scale, medication(s)/side effects and non-pharmacologic comfort measures Outcome: Progressing   Problem: Clinical Measurements: Goal: Ability to maintain clinical measurements within normal limits will improve Outcome: Progressing   

## 2022-08-21 NOTE — Assessment & Plan Note (Addendum)
DVT noted on R upper extremity. Patient R arm is not swollen and nontender. - On Eliquis 5mg  BID for 3 months (7/26), held post surgery d/t blood loss and risk of bleeding. Will restart today 8/1 - Continue to monitor

## 2022-08-21 NOTE — Progress Notes (Signed)
Patient ID: Anthony [name], male   DOB: November 08, 1970, 52 y.o.   MRN: [id number] Difficulty with the wound VAC seal and drainage last night.  Will start 3 times a day dressing changes with Kerlix soaked in Vashe and discontinue the wound VAC dressings.  Initial Gram stain from both hip wounds are negative.

## 2022-08-21 NOTE — Progress Notes (Signed)
Left hip vac dressing started beeping saying blockage, tried to seal, no visible blockage. Left hip was dranning so much pus that the dressing was coming off on its own, saturated through two pads. Charge RN made aware, wound vac was d/ced. Cleaned with Vashe, packed with wet to dry Vashe , 4x4, abd pad and tape. Notified Dr. Laroy Apple that both wounds vac are d/ced and now wet to dry.  DR. [name] was on the floor rounding, notified him . HE said he will put orders for wet to dry dressing change. Plan of care continues.

## 2022-08-21 NOTE — Progress Notes (Signed)
Report received per day shift RN that pt was supposed to get 2 units of blood and had already  received one. Pt had spiked up fever during transfusion and there was questionable hemolytic reaction that might have occurred. 2nd unit of blood was held, when I received report Day Rn said its ok to transfused 2nd unit.  MD's last note stated transfusion reaction work up pending, I paged DR. Spence and Dr. Laroy Apple to clarify if I can stat second unit of blood. MD ordered stat CBC, Hgb was 7.5. Dr. Earlie Server told to hold 2nd unit for now since Hgb is stable and said day team might transfuse  if needed. Also notified MD that left hip wound back is showing blockage, I have been trying to fix it but might have to put wet to dry dressing if not resolved. Pt made aware that he is not getting 2nd unit of blood for now. Plan of care continues.

## 2022-08-21 NOTE — Assessment & Plan Note (Addendum)
Has received 4 units pRBCS during this admission. S/p IV Venofer 7/25. Stable today. Hgb 7.8>7.2 this AM. Patient received a transfusion after excisional debridement.  - Pending post-transfusion H&H - Requires blood from [place] (1 units in house). Type and Screen ordered 7/29. - Hgb 7.5 on AM CBC, no signs or symptoms of acute blood loss -if Hgb declines further and requires additional transfusions, consider d/c'ing Eliquis

## 2022-08-21 NOTE — Progress Notes (Signed)
Palliative Care  Thank you for consulting the Palliative Care Team. Our team will follow this patient intermittently for support and ongoing needs. Hospital course, current plan and goals of care have been reviewed. If this patient's condition or goals of care change please call [phone] or to notify our providers of any acute or new palliative care needs.  Barrie Folk MS Ed.S, RN Palliative Medicine Team  Team Phone: [phone]

## 2022-08-21 NOTE — Progress Notes (Signed)
Occupational Therapy Treatment Patient Details Name: Anthony [name] MRN: [id number] DOB: 17-Dec-1970 Today's Date: 08/21/2022   History of present illness Pt is a 52 y.o. male who presented 07/25/22 with weakness and a fall. Pt admitted with severe sepsis secondary to sacral decubitus ulcer infection with concern for hip septic arthritis noted on CT. Imaging of R shoulder showed moderate acromioclavicular OA & mild distal lasteral subacromial spurring. S/p colostomy 7/23. Pt now bilateral hip excisional debridement, with excision skin and soft tissue muscle fascia and bone on 7/31. PMH: complete paraplegia, multiple toe amputations, chronic sacral wounds, neurogenic bladder with suprapubic catheter, spina bifida, HTN   OT comments  Pt without complaints of any pain. Issued both green  and blue theraband and instructed in HEP he can complete between therapy sessions. Pt returned demonstration. At end of session, pt able to perform full FF on R shoulder in supine. Continues to recommend intensive post acute rehab as pt was functioning mod I prior to admission and demonstrates good potential to return.   Recommendations for follow up therapy are one component of a multi-disciplinary discharge planning process, led by the attending physician.  Recommendations may be updated based on patient status, additional functional criteria and insurance authorization.    Assistance Recommended at Discharge Frequent or constant Supervision/Assistance  Patient can return home with the following  A lot of help with bathing/dressing/bathroom;Assistance with cooking/housework;Assistance with feeding;Direct supervision/assist for medications management;Assist for transportation;Help with stairs or ramp for entrance;A little help with walking and/or transfers   Equipment Recommendations  Hospital bed    Recommendations for Other Services      Precautions / Restrictions Precautions Precautions: Fall;Other  (comment) Precaution Comments: paraplegia, colostomy, wound vac, suprapubic catheter Restrictions Weight Bearing Restrictions: No       Mobility Bed Mobility Overal bed mobility: Needs Assistance Bed Mobility: Supine to Sit, Sit to Supine, Rolling     Supine to sit: Mod assist, +2 for physical assistance     General bed mobility comments: Assist needed for trunk elevation to achieve an upright sitting position at EOB; assist needed for bilateral LE management, left pt with PT seated EOB    Transfers                   General transfer comment: pt deferring     Balance Overall balance assessment: Needs assistance   Sitting balance-Leahy Scale: Fair                                     ADL either performed or assessed with clinical judgement   ADL                                              Extremity/Trunk Assessment Upper Extremity Assessment Upper Extremity Assessment: RUE deficits/detail RUE Deficits / Details: by end of session, pt able to FF full ROM in supine RUE Coordination: decreased gross motor            Vision       Perception     Praxis      Cognition Arousal/Alertness: Awake/alert Behavior During Therapy: WFL for tasks assessed/performed Overall Cognitive Status: Within Functional Limits for tasks assessed  Exercises Exercises: General Upper Extremity General Exercises - Upper Extremity Shoulder Flexion: AAROM, AROM, 10 reps, Supine Shoulder Extension: Strengthening, Both, 20 reps, Supine, Theraband Theraband Level (Shoulder Extension): Level 4 (Blue), Level 3 (Green) Elbow Flexion: Strengthening, Both, 20 reps, Supine, Theraband Theraband Level (Elbow Flexion): Level 4 (Blue) Elbow Extension: Strengthening, Both, 20 reps, Supine, Theraband Theraband Level (Elbow Extension): Level 4 (Blue) Other Exercises Other Exercises: shoulder  depression with blue theraband x 10    Shoulder Instructions       General Comments      Pertinent Vitals/ Pain       Pain Assessment Pain Assessment: No/denies pain  Home Living                                          Prior Functioning/Environment              Frequency  Min 1X/week        Progress Toward Goals  OT Goals(current goals can now be found in the care plan section)  Progress towards OT goals: Progressing toward goals  Acute Rehab OT Goals OT Goal Formulation: With patient Time For Goal Achievement: 08/27/22 Potential to Achieve Goals: Good  Plan Discharge plan remains appropriate    Co-evaluation                 AM-PAC OT "6 Clicks" Daily Activity     Outcome Measure   Help from another person eating meals?: None Help from another person taking care of personal grooming?: A Little Help from another person toileting, which includes using toliet, bedpan, or urinal?: Total Help from another person bathing (including washing, rinsing, drying)?: A Lot Help from another person to put on and taking off regular upper body clothing?: A Lot Help from another person to put on and taking off regular lower body clothing?: Total 6 Click Score: 13    End of Session    OT Visit Diagnosis: Muscle weakness (generalized) (M62.81)   Activity Tolerance Patient tolerated treatment well   Patient Left in bed;with call bell/phone within reach   Nurse Communication          Time: 6387-5643 OT Time Calculation (min): 57 min  Charges: OT General Charges $OT Visit: 1 Visit OT Treatments $Therapeutic Activity: 8-22 mins $Therapeutic Exercise: 8-22 mins  Berna Spare, OTR/L Acute Rehabilitation Services Office: [phone]   Evern Bio 08/21/2022, 12:47 PM

## 2022-08-21 NOTE — Assessment & Plan Note (Addendum)
IV Cefepime (7/5 - 7/16) & Flagyl (7/5 -7/16) completed. 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 Diverting colostomy on 7/23. Dr. [name] agreed that hydrotherapy would be a better therapeutic option at this time vs surgical debridement, however PT doesn't believe his wounds are amenable to hydrotherapy due to deep purulent discharge. Discontinued IV Daptomycin (7/5-7/25) and Zosyn (7/18-7/25). Concern for ileus d/t intermittent episodes of N/V s/p colostomy, but has improved since. Switched from PO abx to IV Daptomycin and Zosyn (7/29) due to concern for lack of source control. - Continue IV Daptomycin and Meropenem (7/30-)  - d/c IV fluids, doing well with PO - Reevaluate IV abx s/p surgical debridement per Dr. [name] 7/31 to determine what source control is like - Pain regimen: Tylenol 1000 mg q6h, Oxycodone 5-10 mg q4h for moderate pain, and Oxycodone 10-15 mg q4 PRN for severe pain + Gabapentin 100 TID - Restart Eliquis 5mg  BID 8/1 per ortho - BCID: strep spp + proteus previously, no growth in 2 days - Surgical cultures pending. No growth in 24hrs from BL hip wound cultures

## 2022-08-21 NOTE — Assessment & Plan Note (Addendum)
Transitioned to Ciprofloxacin and Doxycycline for 14 days (7/25-8/7). PT shared that due to his deep purulent discharge, he is not a great candidate for hydrotherapy. S/p surgical debridement with ortho on 7/31. - Per ortho (Dr. [name]) - Difficulty with the wound VAC seal and drainage overnight 7/31. Will d/c wound vac and switch to wet to dry dressings - PT on board - Wound care on board, discussed to address foot wounds as well - ID signed off, will consult if patient not responsive to Abx regimen

## 2022-08-22 ENCOUNTER — Inpatient Hospital Stay (HOSPITAL_COMMUNITY): Payer: Medicare HMO

## 2022-08-22 DIAGNOSIS — A499 Bacterial infection, unspecified: Secondary | ICD-10-CM | POA: Diagnosis not present

## 2022-08-22 MED ORDER — OXYCODONE HCL 5 MG PO TABS
5.0000 mg | ORAL_TABLET | ORAL | Status: DC | PRN
  Administered 2022-08-22 – 2022-08-30 (×4): 5 mg via ORAL
  Filled 2022-08-22 (×4): qty 1

## 2022-08-22 MED ORDER — HYDROCERIN EX CREA
TOPICAL_CREAM | Freq: Two times a day (BID) | CUTANEOUS | Status: DC
  Administered 2022-08-23 – 2022-08-28 (×5): 1 via TOPICAL
  Filled 2022-08-22 (×2): qty 113

## 2022-08-22 NOTE — Progress Notes (Signed)
Daily Progress Note Intern Pager: [phone]  Patient name: Anthony [name] Medical record number: [id number] Date of birth: Jul 24, 1970 Age: 52 y.o. Gender: male  Primary Care Provider: Erick Alley, DO Consultants: ID, general surgery, ortho Code Status: Full code  Pt Overview and Major Events to Date:  7/5: Admitted 7/6: Ortho consulted, stated no acute intervention indicated. ID consulted, recommended BSA bx 7/7: Hg dropped and patient received 1x pRBCs  7/8: Hg dropped again, 1x pRBCs, [name] ordered per ID recs  7/9: replaced catheter 7/12: received 1x pRBC 7/15: [name] - LVEF 55-60%, normal function 7/19: Surgery cancelled due to IT issues/outage 7/23: Diverting colostomy 7/25: Switched to PO abx - Ciprofloxacin and Doxycycline x10 days 7/26: Switched Lovenox to Eliquis 7/29: Switched back to IV abx - IV Daptomycin and IV Zosyn 7/30: Switched abx to Meropenem 7/31: Bilateral hip excisional debridement & culture + 1x pRBC  Assessment and Plan: Anthony [name] is a 52 yo M with a past medical history of spina bifida with paraplegia and chronic sacral ulcers admitted for septic shock secondary to infected sacral ulcers and osteomyelitis. Patient is s/p diverting colostomy on 7/23 and BL hip debridement on 7/31.  [facility]     * (Principal) Polymicrobial bacterial infection     IV Cefepime (7/5 - 7/16) & Flagyl (7/5 -7/16) completed. 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 Diverting colostomy on 7/23. Dr.  [name] agreed that hydrotherapy would be a better therapeutic option at this  time vs surgical debridement, however PT doesn't believe his wounds are  amenable to hydrotherapy due to deep purulent discharge. Discontinued IV  Daptomycin (7/5-7/25) and Zosyn (7/18-7/25). Concern for ileus d/t  intermittent episodes of N/V s/p colostomy, but has improved since.  Switched from PO abx to IV Daptomycin and Zosyn (7/29) due to concern for  lack of source  control. - Continue IV Daptomycin and Meropenem (7/30-)  - d/c IV fluids, doing well with PO - Reevaluate IV abx s/p surgical debridement per Dr. [name] 7/31 to  determine what source control is like - Pain regimen: Tylenol 1000 mg q6h, Oxycodone 5-10 mg q4h for moderate  pain, and Oxycodone 10-15 mg q4 PRN for severe pain + Gabapentin 100 TID - Restart Eliquis 5mg  BID 8/1 per ortho - BCID: strep spp + proteus previously, no growth in 2 days - Surgical cultures pending. No growth in 24hrs from BL hip wound  cultures, pending final result        Acute Right shoulder pain     XR negative for fracture. Improving with PT. - PT following - Multimodal pain management - Roxabin, Voltaren, Lidoderm, Muscle rub  cream, and PRN Tylenol        Decubitus ulcer due to spina bifida (HCC)     Transitioned to Ciprofloxacin and Doxycycline for 14 days (7/25-8/7).  PT shared that due to his deep purulent discharge, he is not a great  candidate for hydrotherapy. S/p surgical debridement with ortho on 7/31. - Per ortho (Dr. [name]) - Difficulty with the wound VAC seal and drainage  overnight 7/31. Will d/c wound vac and switch to wet to dry dressings - PT on board - Wound care on board, discussed to address foot wounds as well - ID signed off, will consult if patient not responsive to Abx regimen         Hypokalemia     Improved after repletion on 7/31 to 3.6  Anemia of infection and chronic disease     Has received 4 units pRBCS during this admission. S/p IV Venofer 7/25.  Stable today. Hgb 7.8>7.2 this AM. Patient received a transfusion after  excisional debridement.  - Pending post-transfusion H&H - Requires blood from [place] (1 units in house). Type and Screen  ordered 7/29. - Hgb 7.4 on 8/2 AM CBC, no signs or symptoms of acute blood loss -if Hgb declines further and requires additional transfusions, consider  d/c'ing Eliquis        Acute deep vein thrombosis (DVT) of axillary  vein of right upper  extremity (HCC)     DVT noted on R upper extremity. Patient R arm is not swollen and  nontender. - On Eliquis 5mg  BID for 3 months (7/26), held post surgery d/t blood loss  and risk of bleeding. Will restart today 8/1 - Continue to monitor        Nausea & vomiting     KUB 7/25 showed gaseous distention in both large and small intestines  and large bladder stones. Patient had a second episode of emesis 7/26.  7/27 KUB showed nonobstructive bowel gas pattern, no pneumoperitoneum, and  large bladder calculi. Emesis overnight 8/1 without fevers. Resolved with  compazine, nonobstructive KUB.  - Continue to monitor - Simethicone 80mg  q6h PRN - Compazine 10 mg q6h PRN - Notify surgery if patient continues to have N/V         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     Post-op day 5 diverting colostomy by Dr. Dwain Sarna on 7/23. Stoma  clean, pink, with stool and air in ostomy bag. No more episodes of emesis. - Soft diet today - Ostomy bridge - removed around 10 days post-op per GI (8/2) - Bowel regimen: Simethicone 80 mg q6h PRN, Senna 8.6 mg daily, Psyllium 1  packet daily, and Lactulose 10 g BID. - Pain regimen: per ortho s/p debridement + Gabapentin 100 mg TID - Replete Mag and K as indicated (Mg > 2, K > 4)        Pressure injury of contiguous region involving left buttock and hip,  unstageable (HCC)     Pressure injury of contiguous region involving buttock and hip, stage 4  (HCC)    FEN/GI: regular PPx: Eliquis Dispo:pending clinical improvement   Subjective:  Tachycardia and single episode of vomiting overnight, afebrile. Given 1x compazine with resolution of symptoms. Nonobstructive KUB. Speaking with the patient this morning, he denies nausea, vomiting, and chills. States he had a headache this morning and took a tylenol. He also states he has a runny nose, wonders if he has a cold.   Objective: Temp:  [98.4 F (36.9 C)-99.5 F (37.5 C)] 98.9 F (37.2 C) (08/02  0810) Pulse Rate:  [86-128] 95 (08/02 0810) Resp:  [18-23] 20 (08/02 0810) BP: (105-151)/(59-79) 108/59 (08/02 0810) SpO2:  [95 %-100 %] 95 % (08/02 0810) Weight:  [79.8 kg] 79.8 kg (08/02 0500) Physical Exam Constitutional:      General: He is not in acute distress.    Appearance: Normal appearance.  Cardiovascular:     Rate and Rhythm: Normal rate and regular rhythm.     Heart sounds: Normal heart sounds.  Pulmonary:     Effort: Pulmonary effort is normal.     Breath sounds: Normal breath sounds.  Abdominal:     General: There is no distension.     Palpations: Abdomen is soft.     Tenderness: There is no abdominal tenderness.  Musculoskeletal:     Right lower leg: No edema.     Left lower leg: No edema.  Skin:    General: Skin is warm and dry.     Comments: Dressings over BL hip wounds are dry and intact  Neurological:     Mental Status: He is alert.     Laboratory: Most recent CBC Lab Results  Component Value Date   WBC 14.8 (H) 08/22/2022   HGB 7.4 (L) 08/22/2022   HCT 25.2 (L) 08/22/2022   MCV 77.8 (L) 08/22/2022   PLT 469 (H) 08/22/2022   Most recent BMP    Latest Ref Rng & Units 08/21/2022    6:25 AM  BMP  Glucose 70 - 99 mg/dL 295   BUN 6 - 20 mg/dL 15   Creatinine 6.21 - 1.24 mg/dL 3.08   Sodium 657 - 846 mmol/L 135   Potassium 3.5 - 5.1 mmol/L 3.6   Chloride 98 - 111 mmol/L 104   CO2 22 - 32 mmol/L 24   Calcium 8.9 - 10.3 mg/dL 7.9    Imaging/Diagnostic Tests: KUB as above  Lorayne Bender, MD 08/22/2022, 9:34 AM  PGY-1, [facility] FPTS Intern pager: [phone], text pages welcome Secure chat group [facility] Teaching Service

## 2022-08-22 NOTE — Consult Note (Addendum)
WOC Nurse Consult Note: patient followed by Dr. [name] for bilateral hip wounds  Reason for Consult: asked by primary medical team to assess wounds on patients feet  Wound type: 1.  Stage 3 R heel, this wound was noted in wound care clinic note 04/04/2022 2.  Areas of partial thickness skin loss to R and left feet  Pressure Injury POA: Yes Measurement: 1.  Stage 3 R heel 4 cm x 3. 5 cm 90% pink moist 10% slough  2.  Scattered areas of partial thickness skin loss anterior surface R foot, 3.  Full thickness skin loss L foot 2nd, 3rd, and 4th digits (100% black eschar)  3.  Scattered areas of partial thickness skin loss anterior surface of L foot and onto 3rd toe 100% pink and moist   Drainage (amount, consistency, odor) minimal tan exudate to R heel, serosanguinous partial thickness skin loss L foot, R foot dry other than heel  Periwound: patient noted to have dead hyperkeratotic skin to bilateral feet  Dressing procedure/placement/frequency: 1.  Clean R heel wound with Vashe wound cleanser [employer] [place] [phone]) cut silver hydrofiber [employer] [place] [phone]) to fit and apply to wound bed daily, cover with dry gauze and silicone foam.   2.  Wash bilateral feet and in between toes with soap and water.  Dry thoroughly. Apply Eucerin cream to bilateral feet and top of toes twice daily.  DO NOT PLACE BETWEEN TOES.    Place bilateral feet in Prevalon boots to offload pressure. Patient is paralyzed from waist down with no feeling.  When asked if he had a wound on his foot he said he did not know.  I was able to wash patients feet and get a tremendous amount of dead hyperkeratotic skin off at this visit.  Eucerin will help soften remaining dead skin for easier removal.   WOC team will not follow patient for wounds at this time. Re-consult if further needs arise.    WOC Nurse ostomy follow up Stoma type/location: LUQ colostomy  Stomal assessment/size: 2" pink and moist, edematous, slightly oval  Peristomal assessment:  not assessed at this visit  Treatment options for stomal/peristomal skin: 2" barrier ring  Output nothing in bag at this time, surgical PA had just removed red stabilizing catheter and changed entire pouching system right before WOC nurse arrival  Ostomy pouching: 2 piece 2 3/4" skin barrier [employer] [place] #2), 2 3/4" pouch Hart [place] [phone]) and 2" barrier ring [employer] [place] [phone])  Education provided:  patient states he has been assisting to empty the pouch and burp pouch and feels good about this. No further teaching done at this session as ostomy appliance had just been changed.    Enrolled patient in [place] Secure Start Discharge program: Yes  I did personally place (4) 2 3/4" skin barriers (4) 2 3/4" pouches and (4) 2" barrier rings on bedside table at this visit.   WOC team will continue to follow patient for ostomy education and support.  Discharge disposition unknown at this time per patient.   Thank you,    Priscella Mann MSN, RN-BC, [employer] [phone]

## 2022-08-22 NOTE — Progress Notes (Signed)
Prevalon boots applied to bilateral feet.

## 2022-08-22 NOTE — Progress Notes (Signed)
Patient ID: Anthony [name], male   DOB: 1970-08-21, 52 y.o.   MRN: [id number] Patient resting comfortably.  Wound cultures are pending.

## 2022-08-22 NOTE — Progress Notes (Signed)
Id brief note  Following wound cx to see if any resistant organism Hx proteus vulgaris and group b strep bsi s/p 10 days cefepime and dapto  -if by Sunday no resistant organism, would finish 10 more day course with cipro/augmentin starting 7/31 -continue wound care

## 2022-08-22 NOTE — Progress Notes (Signed)
Physical Therapy Treatment Patient Details Name: Anthony [name] MRN: [id number] DOB: 06-10-1970 Today's Date: 08/22/2022   History of Present Illness Pt is a 52 y.o. male who presented 07/25/22 with weakness and a fall. Pt admitted with severe sepsis secondary to sacral decubitus ulcer infection with concern for hip septic arthritis noted on CT. Imaging of R shoulder showed moderate acromioclavicular OA & mild distal lasteral subacromial spurring. S/p colostomy 7/23. Pt now bilateral hip excisional debridement, with excision skin and soft tissue muscle fascia and bone on 7/31. PMH: complete paraplegia, multiple toe amputations, chronic sacral wounds, neurogenic bladder with suprapubic catheter, spina bifida, HTN    PT Comments  Pt received in supine and agreeable to session. Pt reports over performing the RUE exercises since yesterday and has increased pain and soreness today. Pt requires increased assist for mobility tasks this session due to limited RUE use. Pt demonstrates increased difficulty with sitting balance and transfers requiring up to mod A. Pt demonstrating multiple posterior and lateral LOB sitting EOB and during transfer. Pt requires set up assist for transfers with pt demonstrating difficulty with problem solving and directing the equipment set up. Pt attempting to propel w/c with RUE, but is unable to tolerate and requires assist. Pt noted to hit R foot on w/c during transfer and have some bleeding on the top of the foot, RN notified. Pt continues to benefit from PT services to progress toward functional mobility goals.    If plan is discharge home, recommend the following: Two people to help with walking and/or transfers;A lot of help with bathing/dressing/bathroom;Assistance with cooking/housework;Assist for transportation;Help with stairs or ramp for entrance   Can travel by private vehicle        Equipment Recommendations  Hospital bed    Recommendations for Other Services        Precautions / Restrictions Precautions Precautions: Fall;Other (comment) Precaution Comments: paraplegia, colostomy, wound vac, suprapubic catheter     Mobility  Bed Mobility Overal bed mobility: Needs Assistance Bed Mobility: Supine to Sit, Sit to Supine     Supine to sit: Mod assist Sit to supine: Mod assist   General bed mobility comments: When getting up on the R side of the bed, pt able to manage BLE with LUE. Assist for trunk elevation and BLE elevation back to EOB when returning to supine.    Transfers Overall transfer level: Needs assistance Equipment used: Sliding board Transfers: Bed to chair/wheelchair/BSC            Lateral/Scoot Transfers: Mod assist General transfer comment: Pt transferring from bed <> w/c. Pt requiring assist for SB placement and mod assist with bedpad due to increased R shoulder pain. Assist for BLE management. Pt demonstrates difficulty pushing with RUE to elevate hips. Pt with a posterior LOB when transferring to the w/c causing increased R shoulder pain and required assist to correct.     [employer]chant [employer] officer mobility: Yes Wheelchair propulsion: Left upper extremity Wheelchair parts: Needs assistance Distance: 400 Wheelchair Assistance Details (indicate cue type and reason): Pt attempting to propel with RUE intermittently, but is unable to tolerate due to pain. Assist for keeping the w/c straight and line management.       Balance Overall balance assessment: Needs assistance Sitting-balance support: Bilateral upper extremity supported, Single extremity supported, Feet unsupported Sitting balance-Leahy Scale: Poor Sitting balance - Comments: Pt demonstrating multiple posterior and lateral LOB sitting EOB requiring assist to correct  Cognition Arousal/Alertness: Awake/alert Behavior During Therapy: WFL for tasks assessed/performed Overall  Cognitive Status: Within Functional Limits for tasks assessed                                          Exercises      General Comments General comments (skin integrity, edema, etc.): Pt's R foot noted to be bleeding after transfer to w/c, RN notified and present to assess.      Pertinent Vitals/Pain Pain Assessment Pain Assessment: 0-10 Pain Score: 7  Pain Location: R shoulder Pain Descriptors / Indicators: Discomfort, Grimacing, Guarding Pain Intervention(s): Monitored during session, Repositioned     PT Goals (current goals can now be found in the care plan section) Acute Rehab PT Goals Patient Stated Goal: home PT Goal Formulation: With patient Time For Goal Achievement: 08/27/22 Potential to Achieve Goals: Good Progress towards PT goals: Progressing toward goals    Frequency    Min 1X/week      PT Plan Current plan remains appropriate       AM-PAC PT "6 Clicks" Mobility   Outcome Measure  Help needed turning from your back to your side while in a flat bed without using bedrails?: A Little Help needed moving from lying on your back to sitting on the side of a flat bed without using bedrails?: A Lot Help needed moving to and from a bed to a chair (including a wheelchair)?: A Lot Help needed standing up from a chair using your arms (e.g., wheelchair or bedside chair)?: Total Help needed to walk in hospital room?: Total Help needed climbing 3-5 steps with a railing? : Total 6 Click Score: 10    End of Session   Activity Tolerance: Patient limited by pain Patient left: in bed;with call bell/phone within reach;with nursing/sitter in room Nurse Communication: Mobility status PT Visit Diagnosis: Muscle weakness (generalized) (M62.81);Pain Pain - Right/Left: Right Pain - part of body: Shoulder     Time: 1121-1208 PT Time Calculation (min) (ACUTE ONLY): 47 min  Charges:    $Therapeutic Activity: 23-37 mins $Wheel Chair Management: 8-22  mins PT General Charges $$ ACUTE PT VISIT: 1 Visit                     [name], PTA Acute Rehabilitation Services Secure Chat Preferred  [phone]    [name] 08/22/2022, 12:55 PM

## 2022-08-22 NOTE — Progress Notes (Signed)
Occupational Therapy Treatment Patient Details Name: Anthony [name] MRN: [id number] DOB: [birth year] Today's Date: 08/22/2022   History of present illness Pt is a 52 y.o. male who presented 07/25/22 with weakness and a fall. Pt admitted with severe sepsis secondary to sacral decubitus ulcer infection with concern for hip septic arthritis noted on CT. Imaging of R shoulder showed moderate acromioclavicular OA & mild distal lasteral subacromial spurring. S/p colostomy 7/23. Pt now bilateral hip excisional debridement, with excision skin and soft tissue muscle fascia and bone on 7/31. PMH: complete paraplegia, multiple toe amputations, chronic sacral wounds, neurogenic bladder with suprapubic catheter, spina bifida, HTN   OT comments  Pt up with PT earlier today. Focus of session on strengthening UEs with theraband. C/O pain in R shoulder, reports over doing exercises from yesterday. Completed gentle AAROM R shoulder and strengthening of elbow. Ice applied to R shoulder. Requested pt return to self ROM of R shoulder and avoid any resistance until pain subsides. Verbalized understanding.   Recommendations for follow up therapy are one component of a multi-disciplinary discharge planning process, led by the attending physician.  Recommendations may be updated based on patient status, additional functional criteria and insurance authorization.    Assistance Recommended at Discharge Frequent or constant Supervision/Assistance  Patient can return home with the following  A lot of help with bathing/dressing/bathroom;Assistance with cooking/housework;Assistance with feeding;Direct supervision/assist for medications management;Assist for transportation;Help with stairs or ramp for entrance;A little help with walking and/or transfers   Equipment Recommendations  Hospital bed    Recommendations for Other Services      Precautions / Restrictions Precautions Precautions: Fall;Other (comment) Precaution  Comments: paraplegia, colostomy, suprapubic catheter Restrictions Weight Bearing Restrictions: No       Mobility Bed Mobility                    Transfers                         Balance                                           ADL either performed or assessed with clinical judgement   ADL                                              Extremity/Trunk Assessment              Vision       Perception     Praxis      Cognition Arousal/Alertness: Awake/alert Behavior During Therapy: WFL for tasks assessed/performed Overall Cognitive Status: Within Functional Limits for tasks assessed                                          Exercises Exercises: General Upper Extremity General Exercises - Upper Extremity Shoulder Flexion: AAROM, Strengthening, Both, 10 reps, Supine, Theraband Theraband Level (Shoulder Flexion): Level 4 (Blue) (on L) Shoulder Extension: Strengthening, Supine, Theraband, Left, 10 reps Theraband Level (Shoulder Extension): Level 4 (Blue) Elbow Flexion: Strengthening, Both, 20 reps, Supine, Theraband Theraband Level (Elbow Flexion): Level 4 (Blue) Elbow Extension: Strengthening, Both, 20  reps, Supine, Theraband Theraband Level (Elbow Extension): Level 4 (Blue)    Shoulder Instructions       General Comments Pt's R foot noted to be bleeding after transfer to w/c, RN notified and present to assess.    Pertinent Vitals/ Pain       Pain Assessment Faces Pain Scale: Hurts little more Pain Location: R shoulder Pain Descriptors / Indicators: Discomfort, Grimacing, Guarding Pain Intervention(s): Monitored during session, Repositioned, Ice applied  Home Living                                          Prior Functioning/Environment              Frequency  Min 1X/week        Progress Toward Goals  OT Goals(current goals can now be found in the care  plan section)  Progress towards OT goals: Progressing toward goals  Acute Rehab OT Goals OT Goal Formulation: With patient Time For Goal Achievement: 08/27/22 Potential to Achieve Goals: Good  Plan Discharge plan remains appropriate    Co-evaluation                 AM-PAC OT "6 Clicks" Daily Activity     Outcome Measure   Help from another person eating meals?: None Help from another person taking care of personal grooming?: A Little Help from another person toileting, which includes using toliet, bedpan, or urinal?: A Lot Help from another person bathing (including washing, rinsing, drying)?: A Lot Help from another person to put on and taking off regular upper body clothing?: A Lot Help from another person to put on and taking off regular lower body clothing?: Total 6 Click Score: 14    End of Session    OT Visit Diagnosis: Muscle weakness (generalized) (M62.81) Pain - Right/Left: Right Pain - part of body: Shoulder   Activity Tolerance Patient tolerated treatment well   Patient Left in bed;with call bell/phone within reach   Nurse Communication          Time: 9528-4132 OT Time Calculation (min): 23 min  Charges: OT General Charges $OT Visit: 1 Visit OT Treatments $Therapeutic Exercise: 8-22 mins  Berna Spare, OTR/L Acute Rehabilitation Services Office: [phone]   Evern Bio 08/22/2022, 4:10 PM

## 2022-08-22 NOTE — Assessment & Plan Note (Signed)
Has received 4 units pRBCS during this admission. S/p IV Venofer 7/25. Stable today. Hgb 7.8>7.2 this AM. Patient received a transfusion after excisional debridement.  - Pending post-transfusion H&H - Requires blood from [place] (1 units in house). Type and Screen ordered 7/29. - Hgb 7.4 on 8/2 AM CBC, no signs or symptoms of acute blood loss -if Hgb declines further and requires additional transfusions, consider d/c'ing Eliquis

## 2022-08-22 NOTE — Assessment & Plan Note (Signed)
KUB 7/25 showed gaseous distention in both large and small intestines and large bladder stones. Patient had a second episode of emesis 7/26. 7/27 KUB showed nonobstructive bowel gas pattern, no pneumoperitoneum, and large bladder calculi. Emesis overnight 8/1 without fevers. Resolved with compazine, nonobstructive KUB.  - Continue to monitor - Simethicone 80mg  q6h PRN - Compazine 10 mg q6h PRN - Notify surgery if patient continues to have N/V

## 2022-08-22 NOTE — Progress Notes (Signed)
@  2330, while complaining of chills and generalized body aches, pt began vomiting. Undigested macaroni noodles from pt's lunch, which he later endorsed he ate around 1400, were observed in the large amount of emesis. Pt was sat up in bed, oral suction was provided, and PRN compazine was administered with relief. VS obtained prior to and after emesis episode (see flowchart). Will continue to monitor.

## 2022-08-22 NOTE — Progress Notes (Signed)
Progress Note  2 Days Post-Op  Subjective: Pt tolerating diet and having ostomy output. Removed bridge at bedside without complication  Objective: Vital signs in last 24 hours: Temp:  [98.4 F (36.9 C)-99.5 F (37.5 C)] 98.9 F (37.2 C) (08/02 0810) Pulse Rate:  [86-128] 95 (08/02 0810) Resp:  [18-23] 20 (08/02 0810) BP: (105-151)/(59-79) 108/59 (08/02 0810) SpO2:  [95 %-100 %] 95 % (08/02 0810) Weight:  [79.8 kg] 79.8 kg (08/02 0500) Last BM Date : 08/22/22  Intake/Output from previous day: 08/01 0701 - 08/02 0700 In: 1192 [P.O.:720; I.V.:10; IV Piggyback:462] Out: 3275 [Urine:2250; Stool:1025] Intake/Output this shift: No intake/output data recorded.  PE: Abd: soft, air and some stool in bag, incisions clean, stoma viable and red rubber bridge removed    Lab Results:  Recent Labs    08/21/22 0625 08/22/22 0600  WBC 10.9* 14.8*  HGB 7.5* 7.4*  HCT 25.0* 25.2*  PLT 470* 469*   BMET Recent Labs    08/20/22 0520 08/21/22 0625  NA 138 135  K 3.0* 3.6  CL 102 104  CO2 25 24  GLUCOSE 118* 129*  BUN 13 15  CREATININE 0.52* 0.41*  CALCIUM 8.7* 7.9*   PT/INR No results for input(s): "LABPROT", "INR" in the last 72 hours. CMP     Component Value Date/Time   NA 135 08/21/2022 0625   NA 141 03/26/2021 1356   K 3.6 08/21/2022 0625   CL 104 08/21/2022 0625   CO2 24 08/21/2022 0625   GLUCOSE 129 (H) 08/21/2022 0625   BUN 15 08/21/2022 0625   BUN 7 03/26/2021 1356   CREATININE 0.41 (L) 08/21/2022 0625   CREATININE 1.00 03/09/2014 1633   CALCIUM 7.9 (L) 08/21/2022 0625   PROT 6.6 08/21/2022 0625   PROT 8.0 03/26/2021 1356   ALBUMIN 1.7 (L) 08/21/2022 0625   ALBUMIN 4.3 03/26/2021 1356   AST 23 08/21/2022 0625   ALT 18 08/21/2022 0625   ALKPHOS 123 08/21/2022 0625   BILITOT 0.2 (L) 08/21/2022 0625   BILITOT 0.5 03/26/2021 1356   GFRNONAA >60 08/21/2022 0625   GFRAA >60 01/31/2017 0543   Lipase     Component Value Date/Time   LIPASE 29 11/22/2019  2059       Studies/Results: DG Abd 1 View  Result Date: 08/22/2022 CLINICAL DATA:  Vomiting. EXAM: ABDOMEN - 1 VIEW COMPARISON:  08/16/2022, 07/25/2022. FINDINGS: The bowel gas pattern is normal. There is relative paucity of bowel gas. Multiple stones are noted in the urinary bladder. Destructive changes at the hips bilaterally, unchanged from prior exams. IMPRESSION: 1. Nonobstructive bowel-gas pattern. 2. Bladder calculi. Electronically Signed   By: Thornell Sartorius M.D.   On: 08/22/2022 02:45   DG CHEST PORT 1 VIEW  Result Date: 08/20/2022 CLINICAL DATA:  Fever cough EXAM: PORTABLE CHEST 1 VIEW COMPARISON:  08/17/2022 FINDINGS: Left upper extremity central venous catheter tip projects over brachiocephalic confluence as before. No acute airspace disease or effusion. Normal cardiac size. No pneumothorax. IMPRESSION: No active disease. Electronically Signed   By: Jasmine Pang M.D.   On: 08/20/2022 17:55    Anti-infectives: Anti-infectives (From admission, onward)    Start     Dose/Rate Route Frequency Ordered Stop   08/20/22 0600  ceFAZolin (ANCEF) IVPB 2g/100 mL premix        2 g 200 mL/hr over 30 Minutes Intravenous On call to O.R. 08/19/22 1856 08/20/22 0745   08/19/22 1400  DAPTOmycin (CUBICIN) 600 mg in sodium chloride 0.9 %  IVPB        8 mg/kg  75.3 kg 124 mL/hr over 30 Minutes Intravenous Daily 08/19/22 0150     08/19/22 0200  meropenem (MERREM) 1 g in sodium chloride 0.9 % 100 mL IVPB        1 g 200 mL/hr over 30 Minutes Intravenous Every 8 hours 08/19/22 0156     08/18/22 0830  DAPTOmycin (CUBICIN) 600 mg in sodium chloride 0.9 % IVPB  Status:  Discontinued        8 mg/kg  75.3 kg 124 mL/hr over 30 Minutes Intravenous Daily 08/18/22 0649 08/19/22 0145   08/18/22 0745  piperacillin-tazobactam (ZOSYN) IVPB 3.375 g  Status:  Discontinued        3.375 g 12.5 mL/hr over 240 Minutes Intravenous Every 8 hours 08/18/22 0649 08/19/22 0145   08/14/22 1500  ciprofloxacin (CIPRO) tablet  750 mg  Status:  Discontinued        750 mg Oral 2 times daily 08/14/22 1412 08/18/22 0649   08/14/22 1200  amoxicillin-clavulanate (AUGMENTIN) 875-125 MG per tablet 1 tablet  Status:  Discontinued        1 tablet Oral Every 12 hours 08/14/22 1101 08/14/22 1412   08/14/22 1200  doxycycline (VIBRA-TABS) tablet 100 mg  Status:  Discontinued        100 mg Oral Every 12 hours 08/14/22 1101 08/18/22 0649   08/05/22 1500  piperacillin-tazobactam (ZOSYN) IVPB 3.375 g  Status:  Discontinued        3.375 g 100 mL/hr over 30 Minutes Intravenous Every 8 hours 08/05/22 0943 08/05/22 0944   08/05/22 1500  piperacillin-tazobactam (ZOSYN) IVPB 3.375 g  Status:  Discontinued        3.375 g 12.5 mL/hr over 240 Minutes Intravenous Every 8 hours 08/05/22 0945 08/14/22 1101   07/28/22 2200  metroNIDAZOLE (FLAGYL) tablet 500 mg  Status:  Discontinued        500 mg Oral Every 12 hours 07/28/22 1412 08/05/22 0943   07/26/22 1800  ceFEPIme (MAXIPIME) 2 g in sodium chloride 0.9 % 100 mL IVPB  Status:  Discontinued        2 g 200 mL/hr over 30 Minutes Intravenous Every 8 hours 07/26/22 1745 08/05/22 0943   07/25/22 2100  ceFEPIme (MAXIPIME) 2 g in sodium chloride 0.9 % 100 mL IVPB  Status:  Discontinued        2 g 200 mL/hr over 30 Minutes Intravenous Every 12 hours 07/25/22 0926 07/26/22 1745   07/25/22 2100  metroNIDAZOLE (FLAGYL) IVPB 500 mg  Status:  Discontinued        500 mg 100 mL/hr over 60 Minutes Intravenous Every 12 hours 07/25/22 1900 07/28/22 1412   07/25/22 1700  DAPTOmycin (CUBICIN) 650 mg in sodium chloride 0.9 % IVPB  Status:  Discontinued        8 mg/kg  79.4 kg 126 mL/hr over 30 Minutes Intravenous Daily 07/25/22 1546 08/14/22 1101   07/25/22 0800  ceFEPIme (MAXIPIME) 2 g in sodium chloride 0.9 % 100 mL IVPB        2 g 200 mL/hr over 30 Minutes Intravenous  Once 07/25/22 0758 07/25/22 0942   07/25/22 0800  metroNIDAZOLE (FLAGYL) IVPB 500 mg        500 mg 100 mL/hr over 60 Minutes Intravenous   Once 07/25/22 0758 07/25/22 1159        Assessment/Plan  POD10, lap loop sigmoid colostomy by Dr. Dwain Sarna on 7/23 for Spina bfida with paraplegia and  chronic sacral ulcers and osteomyelitis  - on reg diet and bowel regimen - WOCN following for new ostomy - will place referral to ostomy clinic  - bridge removed at bedside - general surgery will sign off at this time, will put follow up and instructions in AVS   FEN - reg VTE - Eliquis ID - On Daptomycin and merrem per ID Foley - SP tube (chronic)   LOS: 27 days    Juliet Rude, [facility] Surgery 08/22/2022, 10:15 AM Please see Amion for pager number during day hours 7:00am-4:30pm

## 2022-08-22 NOTE — Plan of Care (Signed)
  Problem: Education: Goal: Knowledge of General Education information will improve Description Including pain rating scale, medication(s)/side effects and non-pharmacologic comfort measures Outcome: Progressing   

## 2022-08-22 NOTE — Assessment & Plan Note (Signed)
Improved after repletion on 7/31 to 3.6

## 2022-08-22 NOTE — Assessment & Plan Note (Addendum)
IV Cefepime (7/5 - 7/16) & Flagyl (7/5 -7/16) completed. 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 Diverting colostomy on 7/23. Dr. [name] agreed that hydrotherapy would be a better therapeutic option at this time vs surgical debridement, however PT doesn't believe his wounds are amenable to hydrotherapy due to deep purulent discharge. Discontinued IV Daptomycin (7/5-7/25) and Zosyn (7/18-7/25). Concern for ileus d/t intermittent episodes of N/V s/p colostomy, but has improved since. Switched from PO abx to IV Daptomycin and Zosyn (7/29) due to concern for lack of source control. - Continue IV Daptomycin and Meropenem (7/30-)  - d/c IV fluids, doing well with PO - Reevaluate IV abx s/p surgical debridement per Dr. [name] 7/31 to determine what source control is like - Pain regimen: Tylenol 1000 mg q6h, Oxycodone 5-10 mg q4h for moderate pain, and Oxycodone 10-15 mg q4 PRN for severe pain + Gabapentin 100 TID - Restart Eliquis 5mg  BID 8/1 per ortho - BCID: strep spp + proteus previously, no growth in 2 days - Surgical cultures pending. No growth in 24hrs from BL hip wound cultures, pending final result

## 2022-08-23 ENCOUNTER — Inpatient Hospital Stay (HOSPITAL_COMMUNITY): Payer: Medicare HMO

## 2022-08-23 DIAGNOSIS — A499 Bacterial infection, unspecified: Secondary | ICD-10-CM | POA: Diagnosis not present

## 2022-08-23 MED ORDER — IBUPROFEN 400 MG PO TABS
600.0000 mg | ORAL_TABLET | Freq: Four times a day (QID) | ORAL | Status: DC | PRN
  Administered 2022-08-23 – 2022-09-01 (×5): 600 mg via ORAL
  Filled 2022-08-23 (×5): qty 2

## 2022-08-23 NOTE — Assessment & Plan Note (Addendum)
Has received 4U pRBCS during this admission. S/p IV Venofer 7/25. Stable today at 7.6. Transfusion reaction pathology interpretation showed no laboratory evidence of hemolytic transfusion reaction (was obtained after last unit of blood due to fever after administration) - Requires blood from [facility] d/t ANTI e (1 units in house). Type and Screen last performed 7/30 -if Hgb declines further and requires additional transfusions, consider d/c'ing Eliquis - AM CBC

## 2022-08-23 NOTE — Assessment & Plan Note (Addendum)
Initial concern for source of fevers from contamination in wounds from GI fistula vs. osteomyelitis. Diverting colostomy performed on 7/23.  Continue to fever through antibiotic regimen and received bilateral hip excisional debridement on 7/31.  Surgical cultures from hips show moderate WBC but no organisms grown in culture respiratory incubated.  Initial blood cultures positive for Proteus vulgaris and Streptococcus dysgalctiae. Repeat blood cultures NGTD (7/13, 7/29, 7/31). S/p Flagyl, Zosyn, Doxycycline, Augmentin, ciprofloxacin.  Currently on Daptomycin and Meropenem. Per ID, if no resistant organism on surgical cultures would do a 10-day course of Cipro/Augmentin.  He had borderline temperatures last night at 100.1 and 100.2 but did not actually fever.  Will monitor closely today. - Continue IV Daptomycin and Meropenem (7/30-8/4?  If surgical cultures remain no growth per ID)  - Follow-up surgical cultures - Pain regimen: Tylenol 650 mg q6h PRN, Oxycodone 5 mg q4h PRN for moderate pain, Gabapentin 100 TID scheduled, Robaxin 1000 mg q8h scheduled - Monitor fever curve

## 2022-08-23 NOTE — Assessment & Plan Note (Signed)
DVT noted on R upper extremity 7/12.  - On Eliquis 5mg  BID for 3 months, held post surgery d/t blood loss and risk of bleeding but restarted on 8/1 - Continue to monitor

## 2022-08-23 NOTE — Assessment & Plan Note (Addendum)
XR negative for fracture 7/5.  He states he may have overdone it with the bands yesterday.  He also stated that he fell on his right shoulder yesterday and now he cannot raise his arm very high up. - DG shoulder right - PT following - Multimodal pain management - Roxabin, Voltaren, Lidoderm, Muscle rub cream, and PRN Tylenol

## 2022-08-23 NOTE — Plan of Care (Signed)
  Problem: Education: Goal: Knowledge of General Education information will improve Description Including pain rating scale, medication(s)/side effects and non-pharmacologic comfort measures Outcome: Progressing   

## 2022-08-23 NOTE — Assessment & Plan Note (Addendum)
S/p surgical debridement with ortho on 7/31. Surgical cultures no growth so far. - Per ortho (Dr. [name]) - Difficulty with the wound VAC seal and drainage overnight 7/31. Currently on wet to dry dressings - PT on board - Wound care on board, addressing foot wounds as well - ID recommended following wound culture to see if any resistant organisms, if no resistant organism would do 10-day course of Cipro/Augmentin

## 2022-08-23 NOTE — Assessment & Plan Note (Addendum)
POD 11 diverting colostomy by Dr. Dwain Sarna on 7/23. Bridge removed 8/2, surgery signed off.  - Regular diet - Bowel regimen: Senna 8.6 mg daily, Psyllium 1 packet daily, and Lactulose 10 g BID. - Replete Mag and K as indicated (Mg > 2, K > 4) d/t hx of ileus

## 2022-08-23 NOTE — Progress Notes (Signed)
Daily Progress Note Intern Pager: [phone]  Patient name: Anthony [name] Medical record number: [id number] Date of birth: August 07, 1970 Age: 52 y.o. Gender: male  Primary Care Provider: Erick Alley, DO Consultants: ID, general surgery (s/o), orthopedics Code Status: Full  Pt Overview and Major Events to Date:  7/5: Admitted 7/6: Ortho consulted, stated no acute intervention indicated. ID consulted, recommended BSA bx 7/7: Hg dropped and patient received 1x pRBCs  7/8: Hg dropped again, 1x pRBCs, [name] ordered per ID recs  7/9: replaced catheter 7/12: received 1x pRBC 7/15: [name] - LVEF 55-60%, normal function 7/19: Surgery cancelled due to IT issues/outage 7/23: Diverting colostomy 7/25: Switched to PO abx - Ciprofloxacin and Doxycycline x10 days 7/26: Switched Lovenox to Eliquis 7/29: Switched back to IV abx - IV Daptomycin and IV Zosyn 7/30: Switched Zosyn to Meropenem 7/31: Bilateral hip excisional debridement & culture + 1x pRBC 8/2: Colostomy bridge removed at bedside by general surgery  Assessment and Plan: Anthony [name] A. Stamos is a 52 yo M with a past medical history of spina bifida with paraplegia and chronic sacral ulcers admitted for septic shock secondary to infected sacral ulcers and osteomyelitis. Patient is s/p diverting colostomy on 7/23 and BL hip debridement on 7/31.   Antibiotic course: Blood cultures showed streptococcus dysgalactiae and proteus vulgaris. ID was consulted and involved during hospitalization. Started on IV flagyl (7/5-7/8), PO flagyl (7/8-7/16) cefepime (7/5-7/15) and daptomycin (7/5-7/24)  Transitioned antibiotics to Zosyn (7/16-7/24) and Daptomycin (7/5-7/24) due to suspicion of drug fever from cefepime Transitioned to PO antibiotics Doxycycline (7/25-7/28) Augmentin (7/25) and Ciprofloxacin (7/26-7/28) IV antibiotics restarted due to continued fevers to Daptomycin (7/29-continuing) and Zosyn (7/29). Continued to fever on this regimen so was  switched from Zosyn to Meropenem due to hx of proteus and to cover for ESBL (7/29-continuing)   -      Hospital     * (Principal) Polymicrobial bacterial infection     Initial concern for source of fevers from contamination in wounds from  GI fistula vs. osteomyelitis. Diverting colostomy performed on 7/23.   Continue to fever through antibiotic regimen and received bilateral hip  excisional debridement on 7/31.  Surgical cultures from hips show moderate  WBC but no organisms grown in culture respiratory incubated.  Initial  blood cultures positive for Proteus vulgaris and Streptococcus  dysgalctiae. Repeat blood cultures NGTD (7/13, 7/29, 7/31). S/p Flagyl,  Zosyn, Doxycycline, Augmentin, ciprofloxacin.  Currently on Daptomycin and  Meropenem. Per ID, if no resistant organism on surgical cultures would do  a 10-day course of Cipro/Augmentin.  He had borderline temperatures last  night at 100.1 and 100.2 but did not actually fever.  Will monitor closely  today. - Continue IV Daptomycin and Meropenem (7/30-8/4?  If surgical cultures  remain no growth per ID)  - Follow-up surgical cultures - Pain regimen: Tylenol 650 mg q6h PRN, Oxycodone 5 mg q4h PRN for  moderate pain, Gabapentin 100 TID scheduled, Robaxin 1000 mg q8h scheduled - Monitor fever curve        Acute Right shoulder pain     XR negative for fracture 7/5.  He states he may have overdone it with  the bands yesterday.  He also stated that he fell on his right shoulder  yesterday and now he cannot raise his arm very high up. - DG shoulder right - PT following - Multimodal pain management - Roxabin, Voltaren, Lidoderm, Muscle rub  cream, and PRN Tylenol  Decubitus ulcer due to spina bifida [facility])     S/p surgical debridement with ortho on 7/31. Surgical cultures no  growth so far. - Per ortho (Dr. [name]) - Difficulty with the wound VAC seal and drainage  overnight 7/31. Currently on wet to dry dressings - PT on  board - Wound care on board, addressing foot wounds as well - ID recommended following wound culture to see if any resistant  organisms, if no resistant organism would do 10-day course of  Cipro/Augmentin         Anemia of infection and chronic disease     Has received 4U pRBCS during this admission. S/p IV Venofer 7/25.  Stable today at 7.6. Transfusion reaction pathology interpretation showed  no laboratory evidence of hemolytic transfusion reaction (was obtained  after last unit of blood due to fever after administration) - Requires blood from [facility] d/t ANTI e (1 units in house). Type and  Screen last performed 7/30 -if Hgb declines further and requires additional transfusions, consider  d/c'ing Eliquis - AM CBC        Acute deep vein thrombosis (DVT) of axillary vein of right upper  extremity (HCC)     DVT noted on R upper extremity 7/12.  - On Eliquis 5mg  BID for 3 months, held post surgery d/t blood loss and  risk of bleeding but restarted on 8/1 - Continue to monitor        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     POD 11 diverting colostomy by Dr. Dwain Sarna on 7/23. Bridge removed  8/2, surgery signed off.  - Regular diet - Bowel regimen: Senna 8.6 mg daily, Psyllium 1 packet daily, and  Lactulose 10 g BID. 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  He states that the only thing that is bothering him is his right shoulder pain.  He states he may have overdone it with the resistance bands with PT yesterday.  He states that during PT yesterday he fell onto his right shoulder and now cannot raise up his right hand very much/less than  previously.  Objective: Temp:  [98.7 F (37.1 C)-100.2 F (37.9 C)] 98.7 F (37.1 C) (08/03 0353) Pulse Rate:  [95-109] 100 (08/03 0353) Resp:  [18-20] 20 (08/03 0353) BP: (108-130)/(57-68) 119/64 (08/03 0353) SpO2:  [95 %-97 %] 95 % (08/03 0353) Physical Exam: General: NAD, awake, alert, responsive to all questions Cardiovascular: Mildly tachycardic, normal rhythm, no murmurs rubs or gallops Respiratory: CTAB, no wheezes rales or crackles, no increased work of breathing on room air Abdomen: Soft, nontender to palpation, normoactive bowel sounds, stoma patent and pink, ostomy output present Extremities: No lower extremity edema, right shoulder with tenderness to palpation along lateral aspect, neurovascular intact on right side, sensation intact, unable to lift right arm more than 90 degrees  Laboratory: Most recent CBC Lab Results  Component Value Date   WBC 10.8 (H) 08/23/2022   HGB 7.6 (L) 08/23/2022   HCT 26.3 (L) 08/23/2022   MCV 78.7 (L) 08/23/2022   PLT 465 (H) 08/23/2022   Most recent BMP    Latest  Ref Rng & Units 08/21/2022    6:25 AM  BMP  Glucose 70 - 99 mg/dL 811   BUN 6 - 20 mg/dL 15   Creatinine 9.14 - 1.24 mg/dL 7.82   Sodium 956 - 213 mmol/L 135   Potassium 3.5 - 5.1 mmol/L 3.6   Chloride 98 - 111 mmol/L 104   CO2 22 - 32 mmol/L 24   Calcium 8.9 - 10.3 mg/dL 7.9    Levin Erp, MD 08/23/2022, 5:58 AM  PGY-3, [facility] FPTS Intern pager: [phone], text pages welcome Secure chat group [facility] Teaching Service

## 2022-08-24 DIAGNOSIS — A499 Bacterial infection, unspecified: Secondary | ICD-10-CM | POA: Diagnosis not present

## 2022-08-24 MED ORDER — SODIUM CHLORIDE 0.9 % IV SOLN
100.0000 mg | INTRAVENOUS | Status: DC
  Administered 2022-08-24 – 2022-08-27 (×4): 100 mg via INTRAVENOUS
  Filled 2022-08-24 (×5): qty 5

## 2022-08-24 NOTE — Assessment & Plan Note (Signed)
POD 11 diverting colostomy by Dr. Dwain Sarna on 7/23. Bridge removed 8/2, surgery signed off.  - Regular diet - Bowel regimen: Senna 8.6 mg daily, Psyllium 1 packet daily, and Lactulose 10 g BID. - Replete Mag and K as indicated (Mg > 2, K > 4) d/t hx of ileus

## 2022-08-24 NOTE — Assessment & Plan Note (Signed)
XR negative for fracture 7/5.  He states he may have overdone it with working with PT.  He also stated that he fell on his right shoulder Friday and now he cannot raise his arm very high up. - DG shoulder right, noted only mild degenerative changes, no fracture or acute process - PT following - Multimodal pain management - Roxabin, Voltaren, Lidoderm, Muscle rub cream, and PRN Tylenol

## 2022-08-24 NOTE — Progress Notes (Signed)
Daily Progress Note Intern Pager: [phone]  Patient name: Anthony [name] Medical record number: [id number] Date of birth: [birth year] Age: 52 y.o. Gender: male  Primary Care Provider: Erick Alley, DO Consultants: ID  Code Status: Full   Pt Overview and Major Events to Date:  7/5: Admitted 7/6: Ortho consulted, stated no acute intervention indicated. ID consulted, recommended BSA bx 7/7: Hg dropped and patient received 1x pRBCs  7/8: Hg dropped again, 1x pRBCs, [name] ordered per ID recs  7/9: replaced catheter 7/12: received 1x pRBC 7/15: [name] - LVEF 55-60%, normal function 7/19: Surgery cancelled due to IT issues/outage 7/23: Diverting colostomy 7/25: Switched to PO abx - Ciprofloxacin and Doxycycline x10 days 7/26: Switched Lovenox to Eliquis 7/29: Switched back to IV abx - IV Daptomycin and IV Zosyn 7/30: Switched Zosyn to Meropenem 7/31: Bilateral hip excisional debridement & culture + 1x pRBC 8/2: Colostomy bridge removed at bedside by general surgery  Assessment and Plan: Anthony [name] is a 52 yo M with a past medical history of spina bifida with paraplegia and chronic sacral ulcers admitted for septic shock secondary to infected sacral ulcers and osteomyelitis. Patient is s/p diverting colostomy on 7/23 and BL hip debridement on 7/31. Continue to await sensitivities for wound culture, appreciate ID following along with his course. Afebrile overnight and will continue to monitor for fevers.    Antibiotic course: Blood cultures showed streptococcus dysgalactiae and proteus vulgaris. ID was consulted and involved during hospitalization. Started on IV flagyl (7/5-7/8), PO flagyl (7/8-7/16) cefepime (7/5-7/15) and daptomycin (7/5-7/24)  Transitioned antibiotics to Zosyn (7/16-7/24) and Daptomycin (7/5-7/24) due to suspicion of drug fever from cefepime Transitioned to PO antibiotics Doxycycline (7/25-7/28) Augmentin (7/25) and Ciprofloxacin (7/26-7/28) IV antibiotics  restarted due to continued fevers to Daptomycin (7/29-continuing) and Zosyn (7/29). Continued to fever on this regimen so was switched from Zosyn to Meropenem due to hx of proteus and to cover for ESBL (7/29-continuing)   -      Hospital     * (Principal) Polymicrobial bacterial infection     Initial concern for source of fevers from contamination in wounds from  GI fistula vs. osteomyelitis. Diverting colostomy performed on 7/23.   Continue to fever through antibiotic regimen and received bilateral hip  excisional debridement on 7/31.  Surgical cultures from hips show moderate  WBC but no organisms grown in culture respiratory incubated.  Initial  blood cultures positive for Proteus vulgaris and Streptococcus  dysgalctiae. Repeat blood cultures NGTD (7/13, 7/29, 7/31). S/p Flagyl,  Zosyn, Doxycycline, Augmentin, ciprofloxacin.  Currently on Daptomycin and  Meropenem. Per ID, if no resistant organism on surgical cultures would do  a 10-day course of Cipro/Augmentin.  He had borderline temperatures  yesterday at 100.9 and 99.3 this morning. Denies fevers and chills. Will  continue to monitor closely today. - Continue IV Daptomycin and Meropenem (7/30-8/4?  If surgical cultures  remain no growth per ID)  - Follow-up surgical cultures - Pain regimen: Tylenol 650 mg q6h PRN, Oxycodone 5 mg q4h PRN for  moderate pain, Gabapentin 100 TID scheduled, Robaxin 1000 mg q8h scheduled - Monitor fever curve        Acute Right shoulder pain     XR negative for fracture 7/5.  He states he may have overdone it with  working with PT.  He also stated that he fell on his right shoulder Friday  and now he cannot raise his arm very high up. - DG shoulder right, noted  only mild degenerative changes, no fracture or  acute process - PT following - Multimodal pain management - Roxabin, Voltaren, Lidoderm, Muscle rub  cream, and PRN Tylenol        Decubitus ulcer due to spina bifida [facility])     S/p  surgical debridement with ortho on 7/31. Surgical cultures no  growth so far. - Per ortho (Dr. [name]) - Difficulty with the wound VAC seal and drainage  overnight 7/31. Currently on wet to dry dressings - PT on board - Wound care on board, addressing foot wounds as well - ID recommended following wound culture to see if any resistant  organisms, if no resistant organism would do 10-day course of  Cipro/Augmentin  - Awaiting culture sensitivities, enterococcus faecium reported        Anemia of infection and chronic disease     Has received 4U pRBCS during this admission. S/p IV Venofer  7/25.Transfusion reaction pathology interpretation showed no laboratory  evidence of hemolytic transfusion reaction (was obtained after last unit  of blood due to fever after administration) - Requires blood from [facility] d/t ANTI e (1 units in house). Type and  Screen last performed 7/30 -if Hgb declines further and requires additional transfusions, consider  d/c'ing Eliquis - AM CBC tomorrow        Acute deep vein thrombosis (DVT) of axillary vein of right upper  extremity (HCC)     DVT noted on R upper extremity 7/12.  - On Eliquis 5mg  BID for 3 months, held post surgery d/t blood loss and  risk of bleeding but restarted on 8/1 - Continue to monitor        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     POD 11 diverting colostomy by Dr. Dwain Sarna on 7/23. Bridge removed  8/2, surgery signed off.  - Regular diet - Bowel regimen: Senna 8.6 mg daily, Psyllium 1 packet daily, and  Lactulose 10 g BID. - Replete Mag and K as indicated (Mg > 2, K > 4) d/t hx of ileus        Pressure injury of contiguous region involving left buttock and hip,  unstageable (HCC)     Pressure injury of contiguous region involving buttock and hip, stage 4  (HCC)    FEN/GI: Regular  PPx: Eliquis Dispo:Barriers include IV antibiotics .   Subjective:  Patient is okay. Denies fevers, chills, nausea or vomiting. Still has some  right shoulder pain. No additional complaints.   Objective: Temp:  [98.6 F (37 C)-100.9 F (38.3 C)] 99.5 F (37.5 C) (08/04 1247) Pulse Rate:  [94-118] 112 (08/04 1247) Resp:  [16-33] 16 (08/04 1247) BP: (109-128)/(59-70) 115/59 (08/04 1247) SpO2:  [93 %-97 %] 95 % (08/04 1247) FiO2 (%):  [21 %] 21 % (08/03 1813)  Physical Exam: General: NAD, awake, cooperative  Cardiovascular: Tachycardic, no murmurs or rubs appreciated  Respiratory: CTAB in anterior fields, normal work of breathing Abdomen: Soft, nontender, normal bowel sounds, ostomy present  Extremities: right shoulder w/ tenderness, sensation intact  Laboratory: Most recent CBC Lab Results  Component Value Date   WBC 10.8 (H) 08/23/2022   HGB 7.6 (L) 08/23/2022   HCT 26.3 (L) 08/23/2022   MCV 78.7 (L) 08/23/2022   PLT 465 (H) 08/23/2022   Most recent BMP    Latest Ref Rng & Units 08/21/2022    6:25 AM  BMP  Glucose 70 - 99 mg/dL 147   BUN 6 - 20 mg/dL 15   Creatinine 8.29 - 1.24 mg/dL  0.41   Sodium 135 - 145 mmol/L 135   Potassium 3.5 - 5.1 mmol/L 3.6   Chloride 98 - 111 mmol/L 104   CO2 22 - 32 mmol/L 24   Calcium 8.9 - 10.3 mg/dL 7.9     Peterson Ao, MD 08/24/2022, 1:18 PM  PGY-1, [facility] Health Psychiatry Resident  FPTS Intern pager: [phone], text pages welcome Secure chat group [facility] Teaching Service

## 2022-08-24 NOTE — Assessment & Plan Note (Signed)
Initial concern for source of fevers from contamination in wounds from GI fistula vs. osteomyelitis. Diverting colostomy performed on 7/23.  Continue to fever through antibiotic regimen and received bilateral hip excisional debridement on 7/31.  Surgical cultures from hips show moderate WBC but no organisms grown in culture respiratory incubated.  Initial blood cultures positive for Proteus vulgaris and Streptococcus dysgalctiae. Repeat blood cultures NGTD (7/13, 7/29, 7/31). S/p Flagyl, Zosyn, Doxycycline, Augmentin, ciprofloxacin.  Currently on Daptomycin and Meropenem. Per ID, if no resistant organism on surgical cultures would do a 10-day course of Cipro/Augmentin.  He had borderline temperatures yesterday at 100.9 and 99.3 this morning. Denies fevers and chills. Will continue to monitor closely today. - Continue IV Daptomycin and Meropenem (7/30-8/4?  If surgical cultures remain no growth per ID)  - Follow-up surgical cultures - Pain regimen: Tylenol 650 mg q6h PRN, Oxycodone 5 mg q4h PRN for moderate pain, Gabapentin 100 TID scheduled, Robaxin 1000 mg q8h scheduled - Monitor fever curve

## 2022-08-24 NOTE — Assessment & Plan Note (Signed)
Has received 4U pRBCS during this admission. S/p IV Venofer 7/25.Transfusion reaction pathology interpretation showed no laboratory evidence of hemolytic transfusion reaction (was obtained after last unit of blood due to fever after administration) - Requires blood from [facility] d/t ANTI e (1 units in house). Type and Screen last performed 7/30 -if Hgb declines further and requires additional transfusions, consider d/c'ing Eliquis - AM CBC tomorrow

## 2022-08-24 NOTE — Progress Notes (Signed)
Pharmacy Antibiotic Note  Anthony [name] is a 52 y.o. male admitted on 07/25/2022 with  chronic wounds/osteo .    Pt continues to have low grade fever. S/p hip debridement on 7/31. Culture is growing out both enterococcus faecalis and faecium. Currently on dapto and merrem. ID on board.  Scr<1 CK has been normal  Plan: Cont daptomycin 600mg  IV q24 Merrem 1g IV q8h  Height: 5\' 8"  (172.7 cm) Weight: 77.6 kg (171 lb) IBW/kg (Calculated) : 68.4  Temp (24hrs), Avg:99.9 F (37.7 C), Min:98.6 F (37 C), Max:100.9 F (38.3 C)  Recent Labs  Lab 08/18/22 0505 08/19/22 0615 08/20/22 0041 08/20/22 0520 08/20/22 2338 08/21/22 0625 08/22/22 0600 08/23/22 0515  WBC 15.4* 13.7*   < > 10.4 11.9* 10.9* 14.8* 10.8*  CREATININE 0.68 0.60*  --  0.52*  --  0.41*  --   --    < > = values in this interval not displayed.    Estimated Creatinine Clearance: 104.5 mL/min (A) (by C-G formula based on SCr of 0.41 mg/dL (L)).    Allergies  Allergen Reactions   Firvanq [Vancomycin] Itching    7/5 Cefepime >>7/16 7/5 Dapto >> 7/25, 7/29 >> 7/5 Flagyl >>7/16 7/25 Cipro >> 7/29 7/25 Doxy >> 7/29 7/16 Zosyn >> 7/25, 7/29 >> 7/29 Merrem 7/30 >>  7/6 BCx x 2: negF 7/5 BCx x2: Proteus vulgaris (R: Amp, S: Cefepime, Ceftaz, Cipro, Bactrim).  Strep Dysgalactiae 7/5 BCID: Strep sp + Proteus  7/13 BCx2: negF 7/31 surgical tissue cx: ngtd 7/29 blood>>negF 7/31 BCx2: ngtd 7/31 hip>>enterococcus faecium/faecalis  Ulyses Southward, PharmD, BCIDP, AAHIVP, CPP Infectious Disease Pharmacist 08/24/2022 10:42 AM

## 2022-08-24 NOTE — Procedures (Signed)
Met with patient at bedside for exchange of his suprapubic catheter. He usually exchanges catheter himself at home.  Exchange performed by myself and Dr. Birdena Crandall.  The old SPC was removed.  Noted to have 5 mL of normal saline in the bulb. The suprapubic catheter site was cleaned with iodine swabs.  A new catheter was inserted with sterile lubricant jelly, bulb inflated with 5 mL normal saline.  Urine return noted.  The new catheter and a new urine collection bag were fastened in place.  Patient tolerated the procedure well.  Eliezer Mccoy, MD 08/24/22 3:00 PM

## 2022-08-24 NOTE — Assessment & Plan Note (Signed)
DVT noted on R upper extremity 7/12.  - On Eliquis 5mg  BID for 3 months, held post surgery d/t blood loss and risk of bleeding but restarted on 8/1 - Continue to monitor

## 2022-08-24 NOTE — Progress Notes (Signed)
Wound culture known to be growing E faecium. Now also growing rare non-albicans candida spp. Possible that this would explain his persistent fevers despite long-term daptomycin and meropenem.  Discussed briefly with Dr. Elinor Parkinson via secure chat. Will cover with micafungin for now. Dr. Drue Second to see him tomorrow. Defer to her on whether this needs to be continued or not.  - Appreciate ID's assistance with this complex patient - Continue Daptomycin, Meropenem - Micafungin 100mg  q24h - Dr. Drue Second to see tomorrow   Eliezer Mccoy, MD 08/24/22 5:11 PM

## 2022-08-24 NOTE — Progress Notes (Signed)
ID Brief note  Results for orders placed or performed during the hospital encounter of 07/25/22  Resp panel by RT-PCR (RSV, Flu A&B, Covid) Anterior Nasal Swab     Status: None   Collection Time: 07/25/22  7:58 AM   Specimen: Anterior Nasal Swab  Result Value Ref Range Status   SARS Coronavirus 2 by RT PCR NEGATIVE NEGATIVE Final   Influenza A by PCR NEGATIVE NEGATIVE Final   Influenza B by PCR NEGATIVE NEGATIVE Final    Comment: (NOTE) The Xpert Xpress SARS-CoV-2/FLU/RSV plus assay is intended as an aid in the diagnosis of influenza from Nasopharyngeal swab specimens and should not be used as a sole basis for treatment. Nasal washings and aspirates are unacceptable for Xpert Xpress SARS-CoV-2/FLU/RSV testing.  Fact Sheet for Patients: BloggerCourse.com  Fact Sheet for Healthcare Providers: SeriousBroker.it  This test is not yet approved or cleared by the [place] FDA and has been authorized for detection and/or diagnosis of SARS-CoV-2 by FDA under an Emergency Use Authorization (EUA). This EUA will remain in effect (meaning this test can be used) for the duration of the COVID-19 declaration under Section 564(b)(1) of the Act, 21 U.S.C. section 360bbb-3(b)(1), unless the authorization is terminated or revoked.     Resp Syncytial Virus by PCR NEGATIVE NEGATIVE Final    Comment: (NOTE) Fact Sheet for Patients: BloggerCourse.com  Fact Sheet for Healthcare Providers: SeriousBroker.it  This test is not yet approved or cleared by the [place] FDA and has been authorized for detection and/or diagnosis of SARS-CoV-2 by FDA under an Emergency Use Authorization (EUA). This EUA will remain in effect (meaning this test can be used) for the duration of the COVID-19 declaration under Section 564(b)(1) of the Act, 21 U.S.C. section 360bbb-3(b)(1), unless the authorization is  terminated or revoked.  Performed at [facility], [address] [place], [place] [address]   Blood Culture (routine x 2)     Status: Abnormal   Collection Time: 07/25/22  8:15 AM   Specimen: BLOOD  Result Value Ref Range Status   Specimen Description BLOOD SITE NOT SPECIFIED  Final   Special Requests   Final    BOTTLES DRAWN AEROBIC AND ANAEROBIC Blood Culture results may not be optimal due to an inadequate volume of blood received in culture bottles   Culture  Setup Time   Final    GRAM POSITIVE COCCI IN BOTH AEROBIC AND ANAEROBIC BOTTLES GRAM VARIABLE ROD CRITICAL RESULT CALLED TO, READ BACK BY AND VERIFIED WITH: PHARMD Merlene Morse [id number] @ 2221 FH    Culture (A)  Final    PROTEUS VULGARIS STREPTOCOCCUS DYSGALACTIAE SUSCEPTIBILITIES PERFORMED ON PREVIOUS CULTURE WITHIN THE LAST 5 DAYS. Performed at [facility], [address], [place], [place] [address]    Report Status 07/29/2022 FINAL  Final   Organism ID, Bacteria STREPTOCOCCUS DYSGALACTIAE  Final      Susceptibility   Streptococcus dysgalactiae - MIC*    PENICILLIN <=0.06 SENSITIVE Sensitive     CEFTRIAXONE <=0.12 SENSITIVE Sensitive     ERYTHROMYCIN <=0.12 SENSITIVE Sensitive     LEVOFLOXACIN 0.5 SENSITIVE Sensitive     VANCOMYCIN 0.5 SENSITIVE Sensitive     * STREPTOCOCCUS DYSGALACTIAE  Blood Culture (routine x 2)     Status: Abnormal   Collection Time: 07/25/22  8:21 AM   Specimen: BLOOD  Result Value Ref Range Status   Specimen Description BLOOD SITE NOT SPECIFIED  Final   Special Requests   Final  BOTTLES DRAWN AEROBIC AND ANAEROBIC Blood Culture results may not be optimal due to an inadequate volume of blood received in culture bottles   Culture  Setup Time   Final    GRAM POSITIVE COCCI IN CHAINS GRAM POSITIVE RODS ANAEROBIC BOTTLE ONLY CRITICAL RESULT CALLED TO, READ BACK BY AND VERIFIED WITH: PHARMD CAREN AMEND ON 07/25/22 @ 1934 BY DRT CRITICAL RESULT CALLED TO, READ BACK BY AND VERIFIED  WITH: PHARMD Merlene Morse [id number] @ 2221 FH GRAM POSITIVE COCCI IN CHAINS GRAM NEGATIVE RODS BOTTLES DRAWN AEROBIC ONLY    Culture (A)  Final    PROTEUS VULGARIS STREPTOCOCCUS DYSGALACTIAE SUSCEPTIBILITIES PERFORMED ON PREVIOUS CULTURE WITHIN THE LAST 5 DAYS. GRAM POSITIVE RODS FROM GRAN STAIN UNABLE TO ISOLATE FROM CULTURE CRITICAL RESULT CALLED TO, READ BACK BY AND VERIFIED WITH: Royce Macadamia [id number] AT 1037 BY CM Performed at [facility], [address], [place], [place] [address]    Report Status 07/29/2022 FINAL  Final   Organism ID, Bacteria PROTEUS VULGARIS  Final      Susceptibility   Proteus vulgaris - MIC*    AMPICILLIN >=32 RESISTANT Resistant     CEFEPIME <=0.12 SENSITIVE Sensitive     CEFTAZIDIME <=1 SENSITIVE Sensitive     CIPROFLOXACIN <=0.25 SENSITIVE Sensitive     GENTAMICIN <=1 SENSITIVE Sensitive     IMIPENEM 4 SENSITIVE Sensitive     TRIMETH/SULFA <=20 SENSITIVE Sensitive     AMPICILLIN/SULBACTAM 16 INTERMEDIATE Intermediate     PIP/TAZO <=4 SENSITIVE Sensitive     * PROTEUS VULGARIS  Urine Culture     Status: Abnormal   Collection Time: 07/25/22  8:21 AM   Specimen: Urine, Random  Result Value Ref Range Status   Specimen Description URINE, RANDOM  Final   Special Requests   Final    NONE Reflexed from [id number] Performed at [facility], [address], [place], [place] [address]    Culture MULTIPLE SPECIES PRESENT, SUGGEST RECOLLECTION (A)  Final   Report Status 07/27/2022 FINAL  Final  Blood Culture ID Panel (Reflexed)     Status: Abnormal   Collection Time: 07/25/22  8:21 AM  Result Value Ref Range Status   Enterococcus faecalis NOT DETECTED NOT DETECTED Final   Enterococcus Faecium NOT DETECTED NOT DETECTED Final   Listeria monocytogenes NOT DETECTED NOT DETECTED Final   Staphylococcus species NOT DETECTED NOT DETECTED Final   Staphylococcus aureus (BCID) NOT DETECTED NOT DETECTED Final   Staphylococcus epidermidis NOT DETECTED NOT  DETECTED Final   Staphylococcus lugdunensis NOT DETECTED NOT DETECTED Final   Streptococcus species DETECTED (A) NOT DETECTED Final    Comment: Not Enterococcus species, Streptococcus agalactiae, Streptococcus pyogenes, or Streptococcus pneumoniae. CRITICAL RESULT CALLED TO, READ BACK BY AND VERIFIED WITH: Mercer Pod [id number] @ 2221 FH    Streptococcus agalactiae NOT DETECTED NOT DETECTED Final   Streptococcus pneumoniae NOT DETECTED NOT DETECTED Final   Streptococcus pyogenes NOT DETECTED NOT DETECTED Final   A.calcoaceticus-baumannii NOT DETECTED NOT DETECTED Final   Bacteroides fragilis NOT DETECTED NOT DETECTED Final   Enterobacterales DETECTED (A) NOT DETECTED Final    Comment: Enterobacterales represent a large order of gram negative bacteria, not a single organism. CRITICAL RESULT CALLED TO, READ BACK BY AND VERIFIED WITH: PHARMD VAnnice Needy [id number] @ 2221 FH    Enterobacter cloacae complex NOT DETECTED NOT DETECTED Final   Escherichia coli NOT DETECTED NOT DETECTED Final   Klebsiella aerogenes NOT DETECTED NOT DETECTED Final  Klebsiella oxytoca NOT DETECTED NOT DETECTED Final   Klebsiella pneumoniae NOT DETECTED NOT DETECTED Final   Proteus species DETECTED (A) NOT DETECTED Final    Comment: CRITICAL RESULT CALLED TO, READ BACK BY AND VERIFIED WITH: Mercer Pod [id number] @ 2221 FH    Salmonella species NOT DETECTED NOT DETECTED Final   Serratia marcescens NOT DETECTED NOT DETECTED Final   Haemophilus influenzae NOT DETECTED NOT DETECTED Final   Neisseria meningitidis NOT DETECTED NOT DETECTED Final   Pseudomonas aeruginosa NOT DETECTED NOT DETECTED Final   Stenotrophomonas maltophilia NOT DETECTED NOT DETECTED Final   Candida albicans NOT DETECTED NOT DETECTED Final   Candida auris NOT DETECTED NOT DETECTED Final   Candida glabrata NOT DETECTED NOT DETECTED Final   Candida krusei NOT DETECTED NOT DETECTED Final   Candida parapsilosis NOT DETECTED NOT DETECTED Final    Candida tropicalis NOT DETECTED NOT DETECTED Final   Cryptococcus neoformans/gattii NOT DETECTED NOT DETECTED Final   CTX-M ESBL NOT DETECTED NOT DETECTED Final   Carbapenem resistance IMP NOT DETECTED NOT DETECTED Final   Carbapenem resistance KPC NOT DETECTED NOT DETECTED Final   Carbapenem resistance NDM NOT DETECTED NOT DETECTED Final   Carbapenem resist OXA 48 LIKE NOT DETECTED NOT DETECTED Final   Carbapenem resistance VIM NOT DETECTED NOT DETECTED Final    Comment: Performed at [facility], [address], [place], [place] [address]  Culture, blood (Routine X 2) w Reflex to ID Panel     Status: None   Collection Time: 07/26/22  5:55 PM   Specimen: BLOOD  Result Value Ref Range Status   Specimen Description BLOOD LEFT ANTECUBITAL  Final   Special Requests   Final    BOTTLES DRAWN AEROBIC AND ANAEROBIC Blood Culture adequate volume   Culture   Final    NO GROWTH 5 DAYS Performed at [facility], [address] [place], [place] [address]    Report Status 07/31/2022 FINAL  Final  Culture, blood (Routine X 2) w Reflex to ID Panel     Status: None   Collection Time: 07/26/22  6:05 PM   Specimen: BLOOD LEFT HAND  Result Value Ref Range Status   Specimen Description BLOOD LEFT HAND  Final   Special Requests   Final    BOTTLES DRAWN AEROBIC AND ANAEROBIC Blood Culture adequate volume   Culture   Final    NO GROWTH 5 DAYS Performed at [facility], [address] [place], [place] [address]    Report Status 07/31/2022 FINAL  Final  SARS Coronavirus 2 by RT PCR (hospital order, performed in [facility] hospital lab) *cepheid single result test* Anterior Nasal Swab     Status: None   Collection Time: 07/29/22  8:55 AM   Specimen: Anterior Nasal Swab  Result Value Ref Range Status   SARS Coronavirus 2 by RT PCR NEGATIVE NEGATIVE Final    Comment: Performed at [facility], [address], [place], [place] [address]  Culture, blood (Routine X 2) w Reflex to  ID Panel     Status: None   Collection Time: 08/02/22  5:38 PM   Specimen: BLOOD  Result Value Ref Range Status   Specimen Description BLOOD PICC LINE  Final   Special Requests   Final    BOTTLES DRAWN AEROBIC AND ANAEROBIC Blood Culture adequate volume   Culture   Final    NO GROWTH 5 DAYS Performed at [facility],  [address], [place], [place] [address]    Report Status 08/07/2022 FINAL  Final  Culture, blood (Routine X 2) w Reflex to ID Panel     Status: None   Collection Time: 08/02/22  5:53 PM   Specimen: BLOOD  Result Value Ref Range Status   Specimen Description BLOOD PICC LINE  Final   Special Requests   Final    BOTTLES DRAWN AEROBIC AND ANAEROBIC Blood Culture results may not be optimal due to an excessive volume of blood received in culture bottles   Culture   Final    NO GROWTH 5 DAYS Performed at [facility], [address] [place], [place] [address]    Report Status 08/07/2022 FINAL  Final  Culture, blood (Routine X 2) w Reflex to ID Panel     Status: None   Collection Time: 08/18/22  5:55 PM   Specimen: BLOOD RIGHT HAND  Result Value Ref Range Status   Specimen Description BLOOD RIGHT HAND  Final   Special Requests   Final    BOTTLES DRAWN AEROBIC AND ANAEROBIC Blood Culture results may not be optimal due to an inadequate volume of blood received in culture bottles   Culture   Final    NO GROWTH 5 DAYS Performed at [facility], [address] [place], [place] [address]    Report Status 08/23/2022 FINAL  Final  Culture, blood (Routine X 2) w Reflex to ID Panel     Status: None   Collection Time: 08/18/22  5:56 PM   Specimen: BLOOD  Result Value Ref Range Status   Specimen Description BLOOD RIGHT ANTECUBITAL  Final   Special Requests   Final    BOTTLES DRAWN AEROBIC AND ANAEROBIC Blood Culture results may not be optimal due to an inadequate volume of blood received in culture bottles   Culture   Final    NO GROWTH 5 DAYS Performed at  [facility], [address] [place], [place] [address]    Report Status 08/23/2022 FINAL  Final  Surgical pcr screen     Status: None   Collection Time: 08/19/22  9:36 PM   Specimen: Nasal Mucosa; Nasal Swab  Result Value Ref Range Status   MRSA, PCR NEGATIVE NEGATIVE Final   Staphylococcus aureus NEGATIVE NEGATIVE Final    Comment: (NOTE) The Xpert SA Assay (FDA approved for NASAL specimens in patients 54 years of age and older), is one component of a comprehensive surveillance program. It is not intended to diagnose infection nor to guide or monitor treatment. Performed at [facility], [address], [place], [place] [address]   Aerobic/Anaerobic Culture w Gram Stain (surgical/deep wound)     Status: None (Preliminary result)   Collection Time: 08/20/22  7:55 AM   Specimen: Soft Tissue, Other  Result Value Ref Range Status   Specimen Description HIP  Final   Special Requests RIGHT HIP TISSUE  Final   Gram Stain   Final    MODERATE WBC PRESENT,BOTH PMN AND MONONUCLEAR NO ORGANISMS SEEN    Culture   Final    RARE ENTEROCOCCUS FAECALIS SUSCEPTIBILITIES TO FOLLOW Performed at [facility], [address], [place], [place] [address]    Report Status PENDING  Incomplete  Aerobic/Anaerobic Culture w Gram Stain (surgical/deep wound)     Status: None (Preliminary result)   Collection Time: 08/20/22  7:57 AM   Specimen: Soft Tissue, Other  Result Value Ref Range  Status   Specimen Description HIP  Final   Special Requests LEFT HIP TISSUE  Final   Gram Stain   Final    RARE WBC PRESENT,BOTH PMN AND MONONUCLEAR NO ORGANISMS SEEN    Culture   Final    RARE ENTEROCOCCUS FAECIUM SUSCEPTIBILITIES TO FOLLOW CULTURE REINCUBATED FOR BETTER GROWTH Performed at [facility], [address] [place], [place] [address]    Report Status PENDING  Incomplete  Culture, blood (Routine X 2) w Reflex to ID Panel     Status: None (Preliminary result)   Collection  Time: 08/20/22  4:12 PM   Specimen: BLOOD  Result Value Ref Range Status   Specimen Description BLOOD FOOT  Final   Special Requests   Final    BOTTLES DRAWN AEROBIC AND ANAEROBIC Blood Culture results may not be optimal due to an inadequate volume of blood received in culture bottles   Culture   Final    NO GROWTH 4 DAYS Performed at [facility], [address] [place], [place] [address]    Report Status PENDING  Incomplete  Culture, blood (Routine X 2) w Reflex to ID Panel     Status: None (Preliminary result)   Collection Time: 08/20/22  4:14 PM   Specimen: BLOOD  Result Value Ref Range Status   Specimen Description BLOOD FOOT  Final   Special Requests   Final    BOTTLES DRAWN AEROBIC AND ANAEROBIC Blood Culture adequate volume   Culture   Final    NO GROWTH 4 DAYS Performed at [facility], [address] [place], [place] [address]    Report Status PENDING  Incomplete   On daptomcyin and meropenem  Fu cx to completion  Dr Drue Second to follow starting tomorrow  Odette Fraction, MD Infectious Disease Physician [facility] [address] [place], [place] [address] Phone: [phone]  Fax: [phone]

## 2022-08-24 NOTE — Assessment & Plan Note (Addendum)
S/p surgical debridement with ortho on 7/31. Surgical cultures no growth so far. - Per ortho (Dr. [name]) - Difficulty with the wound VAC seal and drainage overnight 7/31. Currently on wet to dry dressings - PT on board - Wound care on board, addressing foot wounds as well - ID recommended following wound culture to see if any resistant organisms, if no resistant organism would do 10-day course of Cipro/Augmentin  - Awaiting culture sensitivities, enterococcus faecium reported

## 2022-08-25 DIAGNOSIS — A499 Bacterial infection, unspecified: Secondary | ICD-10-CM | POA: Diagnosis not present

## 2022-08-25 NOTE — Progress Notes (Signed)
Daily Progress Note Intern Pager: [phone]  Patient name: Anthony [name] Medical record number: [id number] Date of birth: September 28, 1970 Age: 52 y.o. Gender: male  Primary Care Provider: Erick Alley, DO Consultants: ID  Code Status: Full    Pt Overview and Major Events to Date:  7/5: Admitted 7/6: Ortho consulted, stated no acute intervention indicated. ID consulted, recommended BSA bx 7/7: Hg dropped and patient received 1x pRBCs  7/8: Hg dropped again, 1x pRBCs, [name] ordered per ID recs  7/9: replaced catheter 7/12: received 1x pRBC 7/15: [name] - LVEF 55-60%, normal function 7/19: Surgery cancelled due to IT issues/outage 7/23: Diverting colostomy 7/25: Switched to PO abx - Ciprofloxacin and Doxycycline x10 days 7/26: Switched Lovenox to Eliquis 7/29: Switched back to IV abx - IV Daptomycin and IV Zosyn 7/30: Switched Zosyn to Meropenem 7/31: Bilateral hip excisional debridement & culture + 1x pRBC 8/2: Colostomy bridge removed at bedside by general surgery   Assessment and Plan: Anthony [name] is a 52 yo M with a past medical history of spina bifida with paraplegia and chronic sacral ulcers admitted for septic shock secondary to infected sacral ulcers and osteomyelitis. Patient is s/p diverting colostomy on 7/23 and BL hip debridement on 7/31. Continue to await sensitivities for wound culture, appreciate ID following along with his course. Afebrile overnight and will continue to monitor for fevers.    Antimicrobial course: Blood cultures showed streptococcus dysgalactiae and proteus vulgaris. ID was consulted and involved during hospitalization. Started on IV flagyl (7/5-7/8), PO flagyl (7/8-7/16) cefepime (7/5-7/15) and daptomycin (7/5-7/24)  Transitioned antibiotics to Zosyn (7/16-7/24) and Daptomycin (7/5-7/24) due to suspicion of drug fever from cefepime Transitioned to PO antibiotics Doxycycline (7/25-7/28) Augmentin (7/25) and Ciprofloxacin (7/26-7/28) IV antibiotics  restarted due to continued fevers to Daptomycin (7/29-continuing) and Zosyn (7/29). Continued to fever on this regimen so was switched from Zosyn to Meropenem due to hx of proteus and to cover for ESBL (7/29-continuing) Surgical cultures with rare E. Faecium and candida (not albicans): continued on meropenem and daptomycin as above and added micafungin (8/4-continuing) -      Hospital     * (Principal) Polymicrobial bacterial infection     Initial concern for source of fevers from contamination in wounds from  GI fistula vs. osteomyelitis. Diverting colostomy performed on 7/23.   Continue to fever through antibiotic regimen and received bilateral hip  excisional debridement on 7/31.  Surgical cultures now as above. Initial  blood cultures positive for Proteus vulgaris and Streptococcus  dysgalctiae. Repeat blood cultures NGTD (7/13, 7/29, 7/31). S/p Flagyl,  Zosyn, Doxycycline, Augmentin, ciprofloxacin.  Currently on Daptomycin and  Meropenem with added micafungin for candida species. No fevers now for >24  hours. - Continue IV Daptomycin and Meropenem (7/30-) and micafungin (8/4-),  appreciate ID recs - Pain regimen: Tylenol 650 mg q6h PRN, Oxycodone 5 mg q4h PRN for  moderate pain, Gabapentin 100 TID scheduled, Robaxin 1000 mg q8h scheduled - Monitor fever curve        Acute Right shoulder pain     XR negative for fracture 7/5.  He states he may have overdone it with  working with PT.  He also stated that he fell on his right shoulder Friday  and now he cannot raise his arm very high up; likely MSK but some  component of OA. - DG shoulder right, noted only mild degenerative changes, no fracture or  acute process - PT following - Multimodal pain management - Roxabin,  Voltaren (now scheduled),  Lidoderm, Muscle rub cream, and PRN Tylenol - Consider shoulder injection inpatient if persistent         Decubitus ulcer due to spina bifida [facility])     S/p surgical debridement with ortho  on 7/31. Surgical cultures now with  rare E. Faecium (resistant to ampicillin) and candida.  - Continue Daptomycin, Meropenem, and Micafungin for now, follow ID recs,  appreciate assistance - Per ortho (Dr. [name]) - Difficulty with the wound VAC seal and drainage  overnight 7/31. Currently on wet to dry dressings - PT on board - Wound care on board, addressing foot wounds as well        Suprapubic catheter (HCC)     SPC changed 8/4 by day team. UOP 800 mL yesterday per chart. - Monitor IOs        Anemia of infection and chronic disease     Has received 4U pRBCS during this admission. S/p IV Venofer 7/25.  Transfusion reaction pathology interpretation showed no laboratory  evidence of hemolytic transfusion reaction (was obtained after last unit  of blood due to fever after administration) - Requires blood from [facility] d/t ANTI e (1 units in house). Type and  Screen last performed 7/30 - If Hgb declines further and requires additional transfusions, consider  d/c'ing Eliquis - Monitor CBC over hospitalization        Acute deep vein thrombosis (DVT) of axillary vein of right upper  extremity (HCC)     DVT noted on R upper extremity 7/12.  - On Eliquis 5mg  BID for 3 months, held post surgery d/t blood loss and  risk of bleeding but restarted on 8/1 - Continue to monitor        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     POD 12 diverting colostomy by Dr. Dwain Sarna on 7/23. Bridge removed  8/2, surgery signed off.  - Regular diet - Bowel regimen: Senna 8.6 mg daily, Psyllium 1 packet daily, and  Lactulose 10 g BID. 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 Barriers include IV abx.   Subjective:  Doing well  this morning overall. Shoulder pain still present. Wounds not painful. No other concerns.  Objective: Temp:  [98.5 F (36.9 C)-99.4 F (37.4 C)] 98.8 F (37.1 C) (08/05 1155) Pulse Rate:  [96-115] 99 (08/05 1155) Resp:  [17-23] 17 (08/05 1155) BP: (117-135)/(63-90) 129/90 (08/05 1155) SpO2:  [94 %-96 %] 95 % (08/05 1155) Physical Exam: General: Lying in bed, in NAD Cardiovascular: RRR without m/r/g Respiratory: CTAB without w/r/r Abdomen: Nondistended MSK: Pain of R shoulder with adduction, no deformity appreciated/palpated, no pain to palpation  Laboratory: Most recent CBC Lab Results  Component Value Date   WBC 10.8 (H) 08/23/2022   HGB 7.6 (L) 08/23/2022   HCT 26.3 (L) 08/23/2022   MCV 78.7 (L) 08/23/2022   PLT 465 (H) 08/23/2022   Most recent BMP    Latest Ref Rng & Units 08/21/2022    6:25 AM  BMP  Glucose 70 - 99 mg/dL 585   BUN 6 - 20 mg/dL 15   Creatinine 2.77 - 1.24 mg/dL 8.24  Sodium 135 - 145 mmol/L 135   Potassium 3.5 - 5.1 mmol/L 3.6   Chloride 98 - 111 mmol/L 104   CO2 22 - 32 mmol/L 24   Calcium 8.9 - 10.3 mg/dL 7.9    Anthony Georges, MD 08/25/2022, 1:58 PM  PGY-2, [facility] FPTS Intern pager: [phone], text pages welcome Secure chat group [facility] Teaching Service

## 2022-08-25 NOTE — Progress Notes (Signed)
Physical Therapy Treatment Patient Details Name: Anthony [name] MRN: [id number] DOB: February 18, 1970 Today's Date: 08/25/2022   History of Present Illness Pt is a 52 y.o. male who presented 07/25/22 with weakness and a fall. Pt admitted with severe sepsis secondary to sacral decubitus ulcer infection with concern for hip septic arthritis noted on CT. Imaging of R shoulder showed moderate acromioclavicular OA & mild distal lasteral subacromial spurring. S/p colostomy 7/23. Pt now bilateral hip excisional debridement, with excision skin and soft tissue muscle fascia and bone on 7/31. PMH: complete paraplegia, multiple toe amputations, chronic sacral wounds, neurogenic bladder with suprapubic catheter, spina bifida, HTN    PT Comments  Pt received in supine and agreeable to session. Pt able to improve management of BLE during bed mobility this session, however continues to require assist due to R shoulder pain. Pt requires UE support in sitting to maintain balance. Pt able to participate in R shoulder therex and ROM within tolerance, however continues reports of increased pain this session. Pt defers transfer to w/c this session due to decreased stability with R shoulder pain. Pt continues to benefit from PT services to progress toward functional mobility goals.     If plan is discharge home, recommend the following: Two people to help with walking and/or transfers;A lot of help with bathing/dressing/bathroom;Assistance with cooking/housework;Assist for transportation;Help with stairs or ramp for entrance   Can travel by private vehicle        Equipment Recommendations  Hospital bed    Recommendations for Other Services       Precautions / Restrictions Precautions Precautions: Fall;Other (comment) Precaution Comments: paraplegia, colostomy, suprapubic catheter     Mobility  Bed Mobility Overal bed mobility: Needs Assistance Bed Mobility: Supine to Sit, Sit to Supine     Supine to sit: Min  assist Sit to supine: Min assist   General bed mobility comments: Min A for BLE management, but pt able to assist with LUE. Increased time for problem solving    Transfers                   General transfer comment: Pt deferring       Balance Overall balance assessment: Needs assistance Sitting-balance support: Single extremity supported, Feet unsupported Sitting balance-Leahy Scale: Poor Sitting balance - Comments: Pt requires UE support on footrail for balance during RUE mobility                                    Cognition Arousal/Alertness: Awake/alert Behavior During Therapy: WFL for tasks assessed/performed Overall Cognitive Status: Within Functional Limits for tasks assessed                                          Exercises General Exercises - Upper Extremity Shoulder Flexion: AAROM, Supine, Seated, Right, AROM, 15 reps Shoulder ABduction: AAROM, Right, Seated, Supine, 10 reps Chair Push Up: AROM, Both, Seated, 5 reps Other Exercises Other Exercises: Scapular retraction with shoulder depression in sitting x5 reps Other Exercises: massage to R pec tendon during shoulder flexion and abduction x5 reps    General Comments        Pertinent Vitals/Pain Pain Assessment Pain Assessment: Faces Faces Pain Scale: Hurts even more Pain Location: R shoulder with mobility Pain Descriptors / Indicators: Discomfort, Grimacing, Guarding Pain Intervention(s): Monitored during  session, Repositioned     PT Goals (current goals can now be found in the care plan section) Acute Rehab PT Goals Patient Stated Goal: home PT Goal Formulation: With patient Time For Goal Achievement: 08/27/22 Potential to Achieve Goals: Good Progress towards PT goals: Progressing toward goals    Frequency    Min 1X/week      PT Plan Current plan remains appropriate       AM-PAC PT "6 Clicks" Mobility   Outcome Measure  Help needed turning from  your back to your side while in a flat bed without using bedrails?: A Little Help needed moving from lying on your back to sitting on the side of a flat bed without using bedrails?: A Little Help needed moving to and from a bed to a chair (including a wheelchair)?: A Lot Help needed standing up from a chair using your arms (e.g., wheelchair or bedside chair)?: Total Help needed to walk in hospital room?: Total Help needed climbing 3-5 steps with a railing? : Total 6 Click Score: 11    End of Session   Activity Tolerance: Patient limited by pain Patient left: in bed;with call bell/phone within reach Nurse Communication: Mobility status PT Visit Diagnosis: Muscle weakness (generalized) (M62.81);Pain Pain - Right/Left: Right Pain - part of body: Shoulder     Time: 7829-5621 PT Time Calculation (min) (ACUTE ONLY): 33 min  Charges:    $Therapeutic Exercise: 8-22 mins $Therapeutic Activity: 8-22 mins PT General Charges $$ ACUTE PT VISIT: 1 Visit                     [name], PTA Acute Rehabilitation Services Secure Chat Preferred  [phone]    [name] 08/25/2022, 1:50 PM

## 2022-08-25 NOTE — Assessment & Plan Note (Signed)
POD 12 diverting colostomy by Dr. Dwain Sarna on 7/23. Bridge removed 8/2, surgery signed off.  - Regular diet - Bowel regimen: Senna 8.6 mg daily, Psyllium 1 packet daily, and Lactulose 10 g BID. - Replete Mag and K as indicated (Mg > 2, K > 4) d/t hx of ileus

## 2022-08-25 NOTE — Progress Notes (Addendum)
Went to bedside with RN for dressing change of chronic pressure wounds. Wounds appear more dry and less purulent than prior with improved erythema while on antibiotic therapy. There is evidence of sinus tract formation on both sides with now larger tract opening on R hip from prior on 7/27. Wounds on bilateral feet appear comparatively more mild. He tolerated the dressing changes very well.          Would recommend attempting to get DME order for pressure relieving bedding at home given worsening of wounds. Patient would also like electric wheelchair if possible given his shoulder pain making mobilization more difficult. Will reach out to social work about this.

## 2022-08-25 NOTE — Assessment & Plan Note (Addendum)
XR negative for fracture 7/5.  He states he may have overdone it with working with PT.  He also stated that he fell on his right shoulder Friday and now he cannot raise his arm very high up; likely MSK but some component of OA. - DG shoulder right, noted only mild degenerative changes, no fracture or acute process - PT following - Multimodal pain management - Roxabin, Voltaren (now scheduled), Lidoderm, Muscle rub cream, and PRN Tylenol - Consider shoulder injection inpatient if persistent

## 2022-08-25 NOTE — Assessment & Plan Note (Signed)
Initial concern for source of fevers from contamination in wounds from GI fistula vs. osteomyelitis. Diverting colostomy performed on 7/23.  Continue to fever through antibiotic regimen and received bilateral hip excisional debridement on 7/31.  Surgical cultures now as above. Initial blood cultures positive for Proteus vulgaris and Streptococcus dysgalctiae. Repeat blood cultures NGTD (7/13, 7/29, 7/31). S/p Flagyl, Zosyn, Doxycycline, Augmentin, ciprofloxacin.  Currently on Daptomycin and Meropenem with added micafungin for candida species. No fevers now for >24 hours. - Continue IV Daptomycin and Meropenem (7/30-) and micafungin (8/4-), appreciate ID recs - Pain regimen: Tylenol 650 mg q6h PRN, Oxycodone 5 mg q4h PRN for moderate pain, Gabapentin 100 TID scheduled, Robaxin 1000 mg q8h scheduled - Monitor fever curve

## 2022-08-25 NOTE — Assessment & Plan Note (Signed)
S/p surgical debridement with ortho on 7/31. Surgical cultures now with rare E. Faecium (resistant to ampicillin) and candida.  - Continue Daptomycin, Meropenem, and Micafungin for now, follow ID recs, appreciate assistance - Per ortho (Dr. [name]) - Difficulty with the wound VAC seal and drainage overnight 7/31. Currently on wet to dry dressings - PT on board - Wound care on board, addressing foot wounds as well

## 2022-08-25 NOTE — Progress Notes (Signed)
This chaplain attempted a F/U spiritual care visit. The Pt. did not respond to the call of his name and appears to be sleeping. The chaplain will attempt a revisit.  Chaplain Stephanie Acre [phone]

## 2022-08-25 NOTE — Assessment & Plan Note (Signed)
SPC changed 8/4 by day team. UOP 800 mL yesterday per chart. - Monitor IOs

## 2022-08-25 NOTE — Assessment & Plan Note (Addendum)
Has received 4U pRBCS during this admission. S/p IV Venofer 7/25. Transfusion reaction pathology interpretation showed no laboratory evidence of hemolytic transfusion reaction (was obtained after last unit of blood due to fever after administration) - Requires blood from [facility] d/t ANTI e (1 units in house). Type and Screen last performed 7/30 - If Hgb declines further and requires additional transfusions, consider d/c'ing Eliquis - Monitor CBC over hospitalization

## 2022-08-25 NOTE — Assessment & Plan Note (Signed)
DVT noted on R upper extremity 7/12.  - On Eliquis 5mg  BID for 3 months, held post surgery d/t blood loss and risk of bleeding but restarted on 8/1 - Continue to monitor

## 2022-08-25 NOTE — Plan of Care (Signed)
POC initiated and progressing. 

## 2022-08-26 DIAGNOSIS — G8929 Other chronic pain: Secondary | ICD-10-CM

## 2022-08-26 DIAGNOSIS — M8660 Other chronic osteomyelitis, unspecified site: Secondary | ICD-10-CM

## 2022-08-26 DIAGNOSIS — A499 Bacterial infection, unspecified: Secondary | ICD-10-CM | POA: Diagnosis not present

## 2022-08-26 DIAGNOSIS — L8944 Pressure ulcer of contiguous site of back, buttock and hip, stage 4: Secondary | ICD-10-CM | POA: Diagnosis not present

## 2022-08-26 DIAGNOSIS — M25511 Pain in right shoulder: Secondary | ICD-10-CM | POA: Diagnosis not present

## 2022-08-26 NOTE — Progress Notes (Signed)
This chaplain is present for F/U spiritual care in the setting of storytelling.  The Anthony [name]. is awake and watching a movie.   The Anthony [name]. accepted the chaplain's invitation to share more about how he creates a visual image into a three dimensional object.  The Anthony [name]. shares his artistic abilities are limited during this admission because of right shoulder pain. The chaplain listened reflectively and affirmed the Anthony [name]. talents and use of software in his portfolio.   The Anthony [name]. returned to watching TV as the chaplain ended the visit with no mention of pain.  This chaplain is available for F/U spiritual care as needed.  Chaplain Stephanie Acre [phone]

## 2022-08-26 NOTE — Progress Notes (Signed)
Nutrition Follow-up  DOCUMENTATION CODES:   Not applicable  INTERVENTION:   Continue Ensure Plus High Protein po BID, each supplement provides 350 kcal and 20 grams of protein.  Continue regular diet   Continue MVI    NUTRITION DIAGNOSIS:   Increased nutrient needs related to wound healing as evidenced by estimated needs. -ongoing   GOAL:   Patient will meet greater than or equal to 90% of their needs -progressing   MONITOR:   PO intake, Supplement acceptance, Labs, Weight trends, Skin, I & O's  REASON FOR ASSESSMENT:   Consult Assessment of nutrition requirement/status  ASSESSMENT:   52 y.o. male with past medical history of spina bifida with secondary paraplegia, chronic decubitis uclers with osteomyelitis, chronic foley d/t neurogenic bladder, wheelchair bound, endocarditis and hypertension who is admitted with sepsis, R > L hip cellulitis, osteomyelitis with bony destruction of pubic rami and RUE DVT.  7/15 [name] 7/23 lap loop sigmoid colostomy  7/31 b/l hip debridement   Visited patient at bedside who had eaten 100% of his lunch (burger, sweet potato, chocolate pudding). He reports there were a few days where he was feeling unwell and his appetite decreased, but it is back now and he is eating well again. Patient denies N/V/D/C   Wounds are improving per MD   Labs: Glu 100, Cr 0.43, Hgb 7.3 Meds: reviewed   Wt: admit 175#, current 172#   PO: 57% avg intake x last 6 documented meals   I/O's:  -9.2 L since admission    Diet Order:   Diet Order             Diet regular Room service appropriate? Yes; Fluid consistency: Thin  Diet effective now                   EDUCATION NEEDS:   Education needs have been addressed  Skin:  Skin Assessment: Reviewed RN Assessment (Stage IV decubitus ulcers R & L sacrum, R hip ulcer) Skin Integrity Issues:: Stage II, Stage IV Stage II: sacrum Stage IV: sacrum, R hip  Last BM:  250 ml output via  colostomy  Height:   Ht Readings from Last 1 Encounters:  08/12/22 5\' 8"  (1.727 m)    Weight:   Wt Readings from Last 1 Encounters:  08/26/22 78 kg    Ideal Body Weight:  70 kg  BMI:  Body mass index is 26.15 kg/m.  Estimated Nutritional Needs:   Kcal:  2100-2400kcal/day  Protein:  105-120g/day  Fluid:  2.1-2.4L/day    Leodis Rains, RDN, LDN  Clinical Nutrition

## 2022-08-26 NOTE — Progress Notes (Signed)
Inpatient Rehabilitation Admissions Coordinator   Asked to reassess for possible  CIR admit. I previously met with patient and Mom on 7/10. They prefer SNF rehab for prolonged recovery. He is not at a level to be able to tolerate the intensity required of a Cir admit and will need prolonged rehab before return home. We will not pursue Cir admit at this time. Other rehab venues should continue to be pursued.   Ottie Glazier, RN, MSN Rehab Admissions Coordinator [phone] 08/26/2022 1:16 PM

## 2022-08-26 NOTE — Assessment & Plan Note (Addendum)
Polymycrobial infection, multiple waves of treatment and refevering. Febrile overnight to Tmax 100.7. Patient endorses fever but did not have chills.  - Continue Daptomycin 600 mg IV (7/30), Continue fluconazole 400 mg PO, each for 6 weeks from first dose (end planned 9/12) - scheduled tylenol 650 q6h  - Monitor fever curve - Daily CBC

## 2022-08-26 NOTE — Consult Note (Addendum)
WOC Nurse ostomy follow up Stoma type/location: colostomy LUQ Stomal assessment/size: 1 3/4", pink moist, edematous, slightly oval  Peristomal assessment: intact  Treatment options for stomal/peristomal skin: 2" barrier ring  Output approximately 50 mls soft Castiglia stool  Ostomy pouching: 2 piece 2 3/4" skin barrier Lawson #2, 2 3/4" pouch Hart [place] [phone] 2" barrier ring Hart [place] [phone] Education provided:  Reviewed with patient emptying pouch when 1/3 to 1/2 full and changing entire pouching system 2 times a week and if leaking.  Reviewed that he can shower with pouch on or off.  Patient is still limited in movement of his right arm and ostomy is on left side so this hinders his ability to independently change ostomy pouch.  Assisted patient in emptying pouch.  Patient has difficulty operating the lock and roll mechanism to empty pouch.  Did work with patient on this several times. Also had patient take a new pouch and open and close using the lock and roll mechanism.  Patient is able to open and close pouch when it is off the body and he can hold in front of him closer to the R arm. Patient was able to remove old pouch using push and pull technique. Reviewed with patient cleaning around stoma with water moistened washcloth only.  Assisted patient in doing this. I measured stoma at 1 3/4" slightly oval today.  Patient is able to cut new pouch at 1 3/4", I did shape out into oval shape (pattern left in room).  Patient able to stretch 2" barrier ring to fit snugly around stoma using his hand held mirror.  Patient snapped new pouch onto skin barrier and applied to stoma.    Overall, patient knows steps to changing ostomy pouch however remains limited in his ability to do so independently due to limitations with R arm.    Ordered for room (6) 2 3/4" skin barriers Hart [place] #2, (6) 2 3/4" ostomy Hart [place] [phone], (6) 2" barrier ring Hart [place] [phone]  Enrolled patient in [facility] Discharge program: Yes, will  send email verifying they have correct phone number for patient   WOC team will continue to follow for ostomy education/support while remains inpatient.    Thank you,    Priscella Mann MSN, RN-BC, [employer] [phone]

## 2022-08-26 NOTE — Assessment & Plan Note (Addendum)
Has received 5U pRBCS during this admission. S/p tranfusion 8/9. Post H&H 8.2 this AM.  - AM CBC - consider IV iron prior to d/c

## 2022-08-26 NOTE — Assessment & Plan Note (Addendum)
Still has limited ROM of R shoulder. Xray did show degenerative changes in the acromioclavicular joint. Patient amenable to shoulder injection prior to d/c.  - PT following, seen daily - Multimodal pain management as above - Consider shoulder injection tomorrow

## 2022-08-26 NOTE — Assessment & Plan Note (Addendum)
S/p surgical debridement with ortho on 7/31. Surgical cultures with rare E. Faecium (resistant to ampicillin) and candida. Patient reports decreased pain and not requiring oxycodone - Antibiotics as above - PT on board, daily visits - Wound care on board, wet to dry dressings, addressing foot wounds as well - patient tolerated pain, will decrease oxycodone 5mg  to BID prn  - Pain regimen: Tylenol 650 mg q6h, Oxycodone 5 mg BID PRN for moderate pain, Gabapentin 100 TID scheduled, Robaxin 1000 mg q8h scheduled

## 2022-08-26 NOTE — Progress Notes (Signed)
Daily Progress Note Intern Pager: [phone]  Patient name: Anthony [name] Medical record number: [id number] Date of birth: 10-14-1970 Age: 52 y.o. Gender: male  Primary Care Provider: Erick Alley, DO Consultants: Infectious Disease Code Status: Full  Pt Overview and Major Events to Date:  7/5: Admitted 7/6: Ortho consulted, stated no acute intervention indicated. ID consulted, recommended BSA bx 7/7: Hg dropped and patient received 1x pRBCs  7/8: Hg dropped again, 1x pRBCs, [name] ordered per ID recs  7/9: replaced catheter 7/12: received 1x pRBC 7/15: [name] - LVEF 55-60%, normal function 7/19: Surgery cancelled due to IT issues/outage 7/23: Diverting colostomy 7/25: Switched to PO abx - Ciprofloxacin and Doxycycline x10 days 7/26: Switched Lovenox to Eliquis 7/29: Switched back to IV abx - IV Daptomycin and IV Zosyn 7/30: Switched Zosyn to Meropenem 7/31: Bilateral hip excisional debridement & culture + 1x pRBC 8/2: Colostomy bridge removed at bedside by general surgery  Assessment and Plan: Anthony [name] is a 52 y.o. male with a pertinent PMH of spina bifida with paraplegia and chronic sacral ulcers who presented with infected sacral ulcers and was admitted for septic shock secondary to osteomyelitis, currently s/p diverting colostomy 7/23 and bilateral hip debridement 7/31. Currently working on placement in CIR.  [facility]     * (Principal) Polymicrobial bacterial infection     Initial concern for source of fevers from contamination in wounds from  GI fistula vs. osteomyelitis.  Now afebrile since 7/31. Initial blood  cultures positive for Proteus vulgaris and Streptococcus dysgalctiae.  Repeat blood cultures NGTD (7/13, 7/29, 7/31). S/p Flagyl, Zosyn,  Doxycycline, Augmentin, ciprofloxacin, meropenem.  Currently on Daptomycin  with added micafungin for candida species. - Continue IV Daptomycin (7/30-) and micafungin (8/4-), stopping Meropenem  today (7/30-8/6)  appreciate ID recs - Pain regimen: Tylenol 650 mg q6h PRN, Oxycodone 5 mg q4h PRN for  moderate pain, Gabapentin 100 TID scheduled, Robaxin 1000 mg q8h scheduled - Monitor fever curve        Acute Right shoulder pain     Continues to complain of pain.  XR negative for fracture 7/5.  He  states he may have overdone it with working with PT.  He also stated that  he fell on his right shoulder Friday and now he cannot raise his arm very  high up; likely MSK but some component of OA. - DG shoulder right, noted only mild degenerative changes, no fracture or  acute process - PT following - Multimodal pain management - Roxabin, Voltaren (now scheduled),  Lidoderm, Muscle rub cream, and PRN Tylenol - Consider shoulder injection inpatient if persistent         Decubitus ulcer due to spina bifida [facility])     S/p surgical debridement with ortho on 7/31. Surgical cultures now with  rare E. Faecium (resistant to ampicillin) and candida. - Antibiotics as above - Per ortho (Dr. [name]): Difficulty with the wound VAC seal and drainage  overnight 7/31. Currently on wet to dry dressings - PT on board - Wound care on board, addressing foot wounds as well        Suprapubic catheter (HCC)     SPC changed 8/4 by day team. UOP 450 mL yesterday per chart. - Monitor IOs        Anemia of infection and chronic disease     Has received 4U pRBCS during this admission. S/p IV Venofer 7/25.  Transfusion reaction pathology interpretation showed no laboratory  evidence of hemolytic transfusion reaction (was obtained after last unit  of blood due to fever after administration).  Hemoglobin stable at 7.3  today. - Requires blood from [facility] d/t ANTI e (1 units in house). Type and  Screen last performed 7/30 - If Hgb declines further and requires additional transfusions, consider  d/c'ing Eliquis - Monitor H&H daily        Acute deep vein thrombosis (DVT) of axillary vein of right upper  extremity (HCC)      DVT noted on R upper extremity 7/12.  - On Eliquis 5mg  BID for 3 months, held post surgery d/t blood loss and  risk of bleeding but restarted on 8/1 - Continue to monitor        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     POD 12 diverting colostomy by Dr. Dwain Sarna on 7/23. Bridge removed  8/2, surgery signed off. 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  Subjective: Patient is stable this morning, no new changes.  Expresses concern about getting to CIR versus SNF, notes that he cannot go home due to his R shoulder pain.  Objective: Temp:  [97.9 F (36.6 C)-98.9 F (37.2 C)] 98.9 F (37.2 C) (08/06 1308) Pulse Rate:  [97-105] 100 (08/06 1308) Resp:  [17-20] 17 (08/06 1308) BP: (108-124)/(63-74) 112/74 (08/06 1308) SpO2:  [93 %-96 %] 95 % (08/06 1308) Weight:  [78 kg] 78 kg (08/06 0500)  Physical Exam: General: Resting in bed. Deconditioned. NAD, WNWD, alert and at baseline. Cardiovascular: Regular rate and rhythm. Normal S1/S2. No murmurs, rubs, or gallops appreciated. 2+ radial pulses. Pulmonary: Clear bilaterally to ascultation. No increased WOB, no accessory muscle usage on room air. No wheezes, rales, or crackles. Skin: Warm and dry. No rashes grossly. Extremities: Muscle wasting of BLE.  Bilateral hip bandages over wounds dry and not draining, sacral bandage not examined. No peripheral edema bilaterally. 2+ DP pulses bilaterally.  Laboratory: Most recent CBC Lab Results  Component Value Date   WBC 11.1 (H) 08/26/2022   HGB 7.3 (L) 08/26/2022   HCT 24.6 (L) 08/26/2022   MCV 77.1 (L) 08/26/2022   PLT 518 (H) 08/26/2022   Most recent BMP    Latest Ref Rng & Units 08/26/2022     4:05 AM  BMP  Glucose 70 - 99 mg/dL 086   BUN 6 - 20 mg/dL 10   Creatinine 5.78 - 1.24 mg/dL 4.69   Sodium 629 - 528 mmol/L 136   Potassium 3.5 - 5.1 mmol/L 3.5   Chloride 98 - 111 mmol/L 102   CO2 22 - 32 mmol/L 26   Calcium 8.9 - 10.3 mg/dL 8.6     Other pertinent labs: - None  New Imaging/Diagnostic Tests: - None   , MD 08/26/2022, 1:54 PM [facility]  FMTS Intern pager: [phone], text pages welcome Secure chat group [facility] Teaching Service

## 2022-08-26 NOTE — Assessment & Plan Note (Addendum)
POD 12 diverting colostomy by Dr. Dwain Sarna on 7/23. Bridge removed 8/2, surgery signed off. - Regular diet - Bowel regimen: Senna 8.6 mg daily, Psyllium 1 packet daily, and Lactulose 10 g BID

## 2022-08-26 NOTE — Assessment & Plan Note (Addendum)
SPC changed 8/4 by day team, good UOP daily. - Monitor IOs

## 2022-08-26 NOTE — Progress Notes (Signed)
[facility]    Date of Admission:  07/25/2022      ID: Anthony [name] is a 52 y.o. 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 Still having ongoing right shoulder pain with difficulty trying to learn ostomy care which is located in left lower quadrant.  Long standing  Medications:   apixaban  5 mg Oral BID   ascorbic acid  500 mg Oral BID   Chlorhexidine Gluconate Cloth  6 each Topical Daily   feeding supplement  237 mL Oral TID BM   flecainide  50 mg Oral BID   gabapentin  100 mg Oral TID   hydrocerin   Topical BID   lactulose  10 g Oral BID   lidocaine  1-2 patch Transdermal Q24H   loratadine  10 mg Oral Daily   methocarbamol  1,000 mg Oral Q8H   multivitamin with minerals  1 tablet Oral Daily   psyllium  1 packet Oral Daily   senna  1 tablet Oral Daily   sodium chloride flush  10-40 mL Intracatheter Q12H   Vashe Wound Irrigation Optime  34 oz Topical Once   zinc sulfate  220 mg Oral Daily    Objective: Vital signs in last 24 hours: Temp:  [97.9 F (36.6 C)-98.9 F (37.2 C)] 98.9 F (37.2 C) (08/06 1308) Pulse Rate:  [97-105] 100 (08/06 1308) Resp:  [17-20] 17 (08/06 1308) BP: (108-124)/(63-74) 112/74 (08/06 1308) SpO2:  [93 %-96 %] 95 % (08/06 1308) Weight:  [78 kg] 78 kg (08/06 0500)  Physical Exam  Constitutional: He is oriented to person, place, and time. He appears well-developed and well-nourished. No distress.  HENT:  Mouth/Throat: Oropharynx is clear and moist. No oropharyngeal exudate.  Cardiovascular: Normal rate, regular rhythm  and normal heart sounds. Exam reveals no gallop and no friction rub.  No murmur heard.  Pulmonary/Chest: Effort normal and breath sounds normal. No respiratory distress. He has no wheezes.  Abdominal: Soft. Bowel sounds are normal. He exhibits no distension. There is no tenderness.  Lymphadenopathy:  He has no cervical adenopathy.  Neurological: He is alert and oriented to person, place, and time. Difficulty lifting right arm above head Skin: Skin is warm and dry. No rash noted. No erythema.  Psychiatric: He has a normal mood and affect. His behavior is normal.    Lab Results Recent Labs    08/26/22 0405  WBC 11.1*  HGB 7.3*  HCT 24.6*  NA 136  K 3.5  CL 102  CO2 26  BUN 10  CREATININE 0.43*   Liver Panel Recent Labs    08/26/22 0405  PROT 6.8  ALBUMIN 1.7*  AST 24  ALT 26  ALKPHOS 106  BILITOT 0.6   Lab Results  Component Value Date   ESRSEDRATE 140 (H) 08/06/2022    Microbiology: Blood cx 7/31 NGTD 7/31: Hip tissue  RARE ENTEROCOCCUS FAECIUM RARE CANDIDA SPECIES, NOT ALBICANS NO ANAEROBES ISOLATED Performed at [facility], [address] [place], [place] [address]   Report Status 08/25/2022 FINAL  Organism ID, Bacteria ENTEROCOCCUS FAECIUM  Resulting Agency [facility]     Susceptibility    Enterococcus faecium    MIC    AMPICILLIN >=32 RESIST... Resistant    GENTAMICIN SYNERGY SENSITIVE Sensitive    VANCOMYCIN <=0.5 SENSI... Sensitive         RARE ENTEROCOCCUS FAECALIS VANCOMYCIN RESISTANT ENTEROCOCCUS NO ANAEROBES ISOLATED Performed at [facility], [address], [place], [place] [address]   Report Status 08/25/2022 FINAL  Organism ID, Bacteria ENTEROCOCCUS FAECALIS  Resulting Agency [facility]     Susceptibility    Enterococcus faecalis    MIC    AMPICILLIN <=2 SENSITIVE Sensitive    GENTAMICIN SYNERGY SENSITIVE Sensitive    VANCOMYCIN >=32 RESIST... Resistant        Studies/Results: No results found.  Mri  of pelvis IMPRESSION: 1. Chronic osteomyelitis with bony destruction of both proximal femurs, the acetabula, and inferior pubic rami. 2. Large decubitus ulcers extend through the gluteal musculature to the cutaneous surface is posterior to the hip joints. There is tracking gas and fluid within the resulting cavities. 3. Absence of posterior sacrum possibly from bony destruction or spina bifida. Posterior to the sacrum a 6.7 by 2.9 by 4.6 cm complex masslike appearance is again observed. 4. Substantial edema and enhancement in the subcutaneous tissues lateral to both hips, right greater than left, compatible with cellulitis. Cellulitis tracks down in both upper extremities especially anteromedially. 5. There is edema and swelling of the scrotum and scrotal cellulitis is not excluded. 6. Severe fatty atrophy of the upper leg musculature compatible with paralysis. Assessment/Plan: Chronic osteomyelitis of pelvis/sacral mass s/p I x D = cultures showing VRE, e.faecalis, e.facium, and candidal species = recommend to continue on daptomycin plus micafungin. We will stop meropenem since not had any GNR isolated and has had 4 wks of coverage. In terms of candidal species - we have asked micro lab to identify to see if can transition to fluconazole. For the timebeing, please continue on daily micafungin.   Right shoulder pain = appears long standing, suspect rotator cuff tear per exam.  [facility] Pager: [phone]  08/26/2022, 1:22 PM

## 2022-08-27 DIAGNOSIS — M868X8 Other osteomyelitis, other site: Secondary | ICD-10-CM

## 2022-08-27 DIAGNOSIS — Z933 Colostomy status: Secondary | ICD-10-CM | POA: Diagnosis not present

## 2022-08-27 DIAGNOSIS — A499 Bacterial infection, unspecified: Secondary | ICD-10-CM | POA: Diagnosis not present

## 2022-08-27 DIAGNOSIS — L8945 Pressure ulcer of contiguous site of back, buttock and hip, unstageable: Secondary | ICD-10-CM | POA: Diagnosis not present

## 2022-08-27 NOTE — Progress Notes (Signed)
Occupational Therapy Treatment Patient Details Name: Anthony [name] MRN: [id number] DOB: 11-24-1970 Today's Date: 08/27/2022   History of present illness Pt is a 52 y.o. male who presented 07/25/22 with weakness and a fall. Pt admitted with severe sepsis secondary to sacral decubitus ulcer infection with concern for hip septic arthritis noted on CT. Imaging of R shoulder showed moderate acromioclavicular OA & mild distal lasteral subacromial spurring. S/p colostomy 7/23. Pt now bilateral hip excisional debridement, with excision skin and soft tissue muscle fascia and bone on 7/31. PMH: complete paraplegia, multiple toe amputations, chronic sacral wounds, neurogenic bladder with suprapubic catheter, spina bifida, HTN   OT comments  Pain in R shoulder improves with AAROM and place and hold exercises in supine. Completed resistive exercises with blue theraband L UE and elbow of R UE. Manually resisted shoulder extension on R. Pt with inconsistent follow through with UE HEP. Per chart, pt is not a candidate for intensive inpatient rehab, updated recommendation. Patient will benefit from continued inpatient follow up therapy, <3 hours/day. Will address ADLs next session.        If plan is discharge home, recommend the following:  A little help with walking and/or transfers;A lot of help with bathing/dressing/bathroom;Assistance with cooking/housework;Assist for transportation;Help with stairs or ramp for entrance;Supervision due to cognitive status   Equipment Recommendations  Hospital bed    Recommendations for Other Services      Precautions / Restrictions Precautions Precautions: Fall;Other (comment) Precaution Comments: paraplegia, colostomy, suprapubic catheter Restrictions Weight Bearing Restrictions: No       Mobility Bed Mobility                    Transfers                         Balance                                           ADL either  performed or assessed with clinical judgement   ADL   Eating/Feeding: Set up;Bed level                                          Extremity/Trunk Assessment              Vision       Perception     Praxis      Cognition Arousal: Alert Behavior During Therapy: WFL for tasks assessed/performed Overall Cognitive Status: Within Functional Limits for tasks assessed                                 General Comments: pt is inconsistent with follow through with HEP for UEs        Exercises Exercises: General Upper Extremity General Exercises - Upper Extremity Shoulder Flexion: AAROM, Right, 10 reps, Supine, Strengthening, Left, 20 reps, Theraband Theraband Level (Shoulder Flexion): Level 4 (Blue) (L) Shoulder Extension: Strengthening, Supine, Theraband, Left, 20 reps Theraband Level (Shoulder Extension): Level 4 (Blue) (blue L, against OTs resistance R) Elbow Flexion: Strengthening, Both, 20 reps, Supine, Theraband Theraband Level (Elbow Flexion): Level 4 (Blue) Elbow Extension: Strengthening, Both, 20 reps, Supine, Theraband Theraband Level (Elbow Extension): Level 4 (  Blue) Other Exercises Other Exercises: place and hold L shoulder FF at 90 degrees in supine 5 seconds x 5    Shoulder Instructions       General Comments      Pertinent Vitals/ Pain       Pain Assessment Pain Assessment: Faces Faces Pain Scale: Hurts little more Pain Location: R shoulder with AAROM Pain Descriptors / Indicators: Discomfort, Grimacing, Guarding Pain Intervention(s): Monitored during session, Repositioned  Home Living                                          Prior Functioning/Environment              Frequency  Min 1X/week        Progress Toward Goals  OT Goals(current goals can now be found in the care plan section)  Progress towards OT goals: Progressing toward goals  Acute Rehab OT Goals OT Goal Formulation: With  patient Time For Goal Achievement: 09/10/22 Potential to Achieve Goals: Good  Plan Discharge plan remains appropriate    Co-evaluation                 AM-PAC OT "6 Clicks" Daily Activity     Outcome Measure   Help from another person eating meals?: None Help from another person taking care of personal grooming?: A Little Help from another person toileting, which includes using toliet, bedpan, or urinal?: A Lot Help from another person bathing (including washing, rinsing, drying)?: A Lot Help from another person to put on and taking off regular upper body clothing?: A Lot Help from another person to put on and taking off regular lower body clothing?: Total 6 Click Score: 14    End of Session    OT Visit Diagnosis: Muscle weakness (generalized) (M62.81);Pain   Activity Tolerance Patient tolerated treatment well   Patient Left in bed;with call bell/phone within reach;with nursing/sitter in room   Nurse Communication          Time: 1610-9604 OT Time Calculation (min): 15 min  Charges: OT General Charges $OT Visit: 1 Visit OT Treatments $Therapeutic Exercise: 8-22 mins  Berna Spare, OTR/L Acute Rehabilitation Services Office: [phone]   Evern Bio 08/27/2022, 10:32 AM

## 2022-08-27 NOTE — Plan of Care (Addendum)
Plan of care reviewed.  Problem: Health Behavior/Discharge Planning:  Goal: Ability to manage health-related needs will improve Outcome: Progressing. Pt understands the plan for CIR or SNF placement after d/c.   Problem: Clinical Measurements: Goal: Will remain free from infection Outcome: Progressing; Pt remains afebrile. Pt has been treated with multiple antibiotics.     Problem: Clinical Measurements: Goal: Cardiovascular complication will be avoided Outcome: Progressing. PT is stable hemodynamically. NSR on the monitor. No acute distress noted.    Problem: Activity: Pt has limit mobilities from paraplegia lower extremities and right arm pain from history of fall prior this admission.  Goal: Risk for activity intolerance will decrease Outcome: Not progressing, PT has been working with his mobilities. Pt is dependent on mobilities out bed, but he is able to use left arm to help himself turning positions.      Problem: Nutrition: Goal: Adequate nutrition will be maintained Outcome: Progressing: Pt is on regular diet and having great appetite.   Problem: Coping: Goal: Level of anxiety will decrease Outcome: Progressing: Pt is alert and oriented x 4, pleasant and cooperative with the plan of care.    Problem: Elimination: Diverted colostomy on July23,2024.  Goal: Will not experience complications related to bowel motility Outcome: Progressing: Colostomy stoma is pink and normal stool output. Colostomy care was discussed with Pt. The wound care team already educated Pt about colostomy care.    Problem: Elimination: Pt has had suprapubic bladder catheter for 12 years. Goal: Will not experience complications related to urinary retention Outcome: Progressing: Pt has good urine output. No signs of bladder retention. Catheter was cleaned and split gauze dressing applied.     Problem: Pain Managment: Goal: General experience of comfort will improve Outcome: Progressing: Pain is well  controlled. He is able to rest well tonight.    Problem: Skin Integrity: Chronic sacral pressure ulcer with polymicrobial infection. Goal: Risk for impaired skin integrity will decrease Outcome: Progressing: Pt has been treated with antibiotics. Wound care team was consulted, sacral dressing changed TID.   Filiberto Pinks, RN

## 2022-08-27 NOTE — TOC Progression Note (Signed)
Transition of Care [facility]) - Progression Note    Patient Details  Name: Anthony [name] MRN: [id number] Date of Birth: 06-Sep-1970  Transition of Care [facility]) CM/SW Contact  Eduard Roux, [place] Phone Number: 08/27/2022, 5:02 PM  Clinical Narrative:     [name] spoke with patient by phone. [name] informed the patient he has no bed offers at this time. Patient gave [name] permission to send SNF referral outside of [facility]. [name] encourage patient to explore other options if unable to discharge to SNF. Patient states understanding.  [name], MSW, LCSW Clinical Social Worker    Expected Discharge Plan: Skilled Nursing Facility Barriers to Discharge: Continued Medical Work up  Expected Discharge Plan and Services In-house Referral: Clinical Social Work   Post Acute Care Choice: Skilled Nursing Facility Living arrangements for the past 2 months: Single Family Home                                       Social Determinants of Health (SDOH) Interventions SDOH Screenings   Food Insecurity: No Food Insecurity (07/26/2022)  Housing: Low Risk  (07/26/2022)  Transportation Needs: No Transportation Needs (07/26/2022)  Utilities: Not At Risk (07/26/2022)  Alcohol Screen: Low Risk  (01/23/2022)  Depression (PHQ2-9): Low Risk  (02/27/2022)  Financial Resource Strain: Medium Risk (01/23/2022)  Physical Activity: Inactive (01/23/2022)  Social Connections: Socially Isolated (01/23/2022)  Stress: Stress Concern Present (01/23/2022)  Tobacco Use: Medium Risk (08/20/2022)    Readmission Risk Interventions     No data to display

## 2022-08-27 NOTE — Progress Notes (Signed)
ID PROGRESS NOTE   Septic hip/pelvic osteomyelitis -- polymicrobial infection, currently on daptomycin plus micafungin. We are asking lab for sensitivities on CANDIDA ORTHOPSILOSIS. There is good chance he will only need iv daptomycin plus oral fluconazole.plan for minimum of 4 wk of iv abtx since initiation of daptomycin.  Duke Salvia Drue Second MD MPH [facility] [phone]

## 2022-08-27 NOTE — Progress Notes (Signed)
Physical Therapy Treatment Patient Details Name: [name] MRN: [id number] DOB: [birth year] Today's Date: 08/27/2022   History of Present Illness Pt is a 52 y.o. male who presented 07/25/22 with weakness and a fall. Pt admitted with severe sepsis secondary to sacral decubitus ulcer infection with concern for hip septic arthritis noted on CT. Imaging of R shoulder showed moderate acromioclavicular OA & mild distal lasteral subacromial spurring. S/p colostomy 7/23. Pt now bilateral hip excisional debridement, with excision skin and soft tissue muscle fascia and bone on 7/31. PMH: complete paraplegia, multiple toe amputations, chronic sacral wounds, neurogenic bladder with suprapubic catheter, spina bifida, HTN    PT Comments  Pt received in supine and agreeable to session. Pt reports improved R shoulder pain today, however continues to be limited during mobility tasks. Pt able to perform bed mobility without physical assist this session. Pt able to transfer to the w/c with min A despite increased shoulder pain. Pt able to tolerate increased distance with w/c mobility and propel with BUE. Pt agreeable to sit in recliner at the end of the session and reports that it is "pretty comfortable". Pt encouraged to transfer to recliner for meals to increase time OOB and improve upright posture. After discussion with supervising PT Gwendolyn Grant., discharge recommendations were updated to continued inpatient follow up therapy, <3 hours/day. Pt continues to benefit from PT services to progress toward functional mobility goals.    If plan is discharge home, recommend the following: Two people to help with walking and/or transfers;A lot of help with bathing/dressing/bathroom;Assistance with cooking/housework;Assist for transportation;Help with stairs or ramp for entrance   Can travel by private vehicle        Equipment Recommendations  Hospital bed    Recommendations for Other Services       Precautions /  Restrictions Precautions Precautions: Fall;Other (comment) Precaution Comments: paraplegia, colostomy, suprapubic catheter Restrictions Weight Bearing Restrictions: No RLE Weight Bearing: Non weight bearing LLE Weight Bearing: Non weight bearing     Mobility  Bed Mobility Overal bed mobility: Needs Assistance Bed Mobility: Supine to Sit     Supine to sit: Contact guard     General bed mobility comments: Pt able to manage BLE with LUE and sit to EOB with increased time and use of bedrail    Transfers Overall transfer level: Needs assistance Equipment used: None Transfers: Bed to chair/wheelchair/BSC            Lateral/Scoot Transfers: Min assist General transfer comment: Pt initially demonstrating difficulty offoading bottom to scoot due to RUE pain, however is able to lateral scoot to w/c with min A with bedpad. Pt also able to lateral scoot from w/c to recliner with light min A with bedpad, but improved offloading.      [employer]chant [employer] officer mobility: Yes Wheelchair propulsion: Both upper extremities Wheelchair parts: Supervision/cueing Distance: 940 Wheelchair Assistance Details (indicate cue type and reason): Pt able to tolerate propelling wtih BUE this session with pt reporting 3-4/10 R shoulder pain, however is able to tolerate. Assist with line management       Balance Overall balance assessment: Needs assistance Sitting-balance support: Single extremity supported, Feet unsupported Sitting balance-Leahy Scale: Poor Sitting balance - Comments: Sitting EOB. Pt requires UE support to maintain balance       Standing balance comment: unable at baseline  Cognition Arousal: Alert Behavior During Therapy: WFL for tasks assessed/performed Overall Cognitive Status: Within Functional Limits for tasks assessed                                          Exercises General  Exercises - Upper Extremity Shoulder Flexion: PROM, Right, 5 reps Shoulder ABduction: PROM, Right, 5 reps    General Comments        Pertinent Vitals/Pain Pain Assessment Pain Assessment: 0-10 Pain Score: 4  Pain Location: R shoulder with mobility Pain Descriptors / Indicators: Discomfort, Grimacing, Guarding Pain Intervention(s): Monitored during session, Repositioned     PT Goals (current goals can now be found in the care plan section) Acute Rehab PT Goals Patient Stated Goal: home PT Goal Formulation: With patient Time For Goal Achievement: 08/27/22 Potential to Achieve Goals: Good Progress towards PT goals: Progressing toward goals    Frequency    Min 1X/week      PT Plan Discharge plan needs to be updated       AM-PAC PT "6 Clicks" Mobility   Outcome Measure  Help needed turning from your back to your side while in a flat bed without using bedrails?: None Help needed moving from lying on your back to sitting on the side of a flat bed without using bedrails?: A Little Help needed moving to and from a bed to a chair (including a wheelchair)?: A Little Help needed standing up from a chair using your arms (e.g., wheelchair or bedside chair)?: Total Help needed to walk in hospital room?: Total Help needed climbing 3-5 steps with a railing? : Total 6 Click Score: 13    End of Session   Activity Tolerance: Patient tolerated treatment well Patient left: in chair;with call bell/phone within reach Nurse Communication: Mobility status PT Visit Diagnosis: Muscle weakness (generalized) (M62.81);Pain Pain - Right/Left: Right Pain - part of body: Shoulder     Time: 1140-1220 PT Time Calculation (min) (ACUTE ONLY): 40 min  Charges:    $Therapeutic Activity: 23-37 mins $Wheel Chair Management: 8-22 mins PT General Charges $$ ACUTE PT VISIT: 1 Visit                     [name], PTA Acute Rehabilitation Services Secure Chat Preferred  [phone]  [phone]    [name] 08/27/2022, 12:59 PM

## 2022-08-27 NOTE — Progress Notes (Signed)
Pt tolerated wound care without c/o pain. No bleeding, foul odor. No bleeding seen.  Reva Bores 08/27/22 6:42 PM

## 2022-08-27 NOTE — Assessment & Plan Note (Signed)
DVT noted on R upper extremity 7/12.  No shortness of breath, chest pain today. - On Eliquis 5mg  BID for 3 months, held post surgery d/t blood loss and risk of bleeding but restarted on 8/1 - Continue to monitor

## 2022-08-27 NOTE — Progress Notes (Addendum)
Daily Progress Note Intern Pager: [phone]  Patient name: Anthony [name] Medical record number: [id number] Date of birth: 06/01/1970 Age: 52 y.o. Gender: male  Primary Care Provider: Erick Alley, DO Consultants: Infectious Disease Code Status: FULL  Pt Overview and Major Events to Date:  7/5: Admitted 7/6: Ortho consulted, stated no acute intervention indicated. ID consulted, recommended BSA bx 7/7: Hg dropped and patient received 1x pRBCs  7/8: Hg dropped again, 1x pRBCs, [name] ordered per ID recs  7/9: Replaced catheter 7/12: Received 1x pRBC 7/15: [name] - LVEF 55-60%, normal function 7/19: Surgery cancelled due to IT issues/outage 7/23: Diverting colostomy 7/25: Switched to PO abx - Ciprofloxacin and Doxycycline x10 days 7/26: Switched Lovenox to Eliquis 7/29: Switched back to IV abx - IV Daptomycin and IV Zosyn 7/30: Switched Zosyn to Meropenem 7/31: Bilateral hip excisional debridement & culture + 1x pRBC 8/2: Colostomy bridge removed at bedside by general surgery 8/6: Stopped meropenem  Assessment and Plan: Anthony [name] is a 52 y.o. male with a pertinent PMH of spina bifida with paraplegia and chronic sacral ulcers who presented with infected sacral ulcers and was admitted for septic shock secondary to osteomyelitis, currently s/p diverting colostomy 7/23 and bilateral hip debridement 7/31. Currently working on dispo/placement.  [facility]     * (Principal) Polymicrobial bacterial infection     Initial concern for source of fevers from contamination in wounds from  GI fistula vs. osteomyelitis.  Now afebrile since 7/31. Initial blood  cultures positive for Proteus vulgaris and Streptococcus dysgalctiae.  Repeat blood cultures NGTD (7/13, 7/29, 7/31). S/p Flagyl, Zosyn,  Doxycycline, Augmentin, ciprofloxacin, meropenem.  Currently on Daptomycin  with added micafungin for candida species. - Continue IV Daptomycin (7/30-) and micafungin (8/4-) for 4 weeks from   start - Pain regimen: Tylenol 650 mg q6h PRN, Oxycodone 5 mg q4h PRN for  moderate pain, Gabapentin 100 TID scheduled, Robaxin 1000 mg q8h scheduled - Monitor fever curve        Acute Right shoulder pain     Continues to complain of pain.  XR negative for fracture 7/5.  He  states he may have overdone it with working with PT.  He also stated that  he fell on his right shoulder Friday and now he cannot raise his arm very  high up; likely MSK but some component of OA. - DG shoulder right, noted only mild degenerative changes, no fracture or  acute process - PT following - Multimodal pain management - Roxabin, Voltaren (now scheduled),  Lidoderm, Muscle rub cream, and PRN Tylenol - Consider shoulder injection inpatient if persistent         Decubitus ulcer due to spina bifida [facility])     S/p surgical debridement with ortho on 7/31. Surgical cultures now with  rare E. Faecium (resistant to ampicillin) and candida. - Antibiotics as above - Per ortho (Dr. [name]): Difficulty with the wound VAC seal and drainage  overnight 7/31. Currently on wet to dry dressings - PT on board - Wound care on board, addressing foot wounds as well        Suprapubic catheter (HCC)     SPC changed 8/4 by day team. UOP 2175 mL yesterday per chart. - Monitor IOs        Anemia of infection and chronic disease     Has received 4U pRBCS during this admission. S/p IV Venofer 7/25.  Transfusion reaction pathology interpretation showed no laboratory  evidence of hemolytic transfusion reaction (was obtained after last unit  of blood due to fever after administration).  Hemoglobin stable. - Requires blood from [facility] d/t ANTI e (1 units in house). Type and  Screen last performed 7/30 - If Hgb declines and patient requires additional transfusions, consider  d/c'ing Eliquis - Monitor H&H daily        Acute deep vein thrombosis (DVT) of axillary vein of right upper  extremity (HCC)     DVT noted on R upper  extremity 7/12.  No shortness of breath, chest  pain today. - On Eliquis 5mg  BID for 3 months, held post surgery d/t blood loss and  risk of bleeding but restarted on 8/1 - Continue to monitor        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     POD 12 diverting colostomy by Dr. Dwain Sarna on 7/23. Bridge removed  8/2, surgery signed off.  Colostomy bag intact and with liquid Ringer  stool. 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  Pending completion of IV antibiotics per ID recs.  Subjective:  This morning, patient states he is unchanged from yesterday.  No new symptoms, denies chest pain, headache, chills, fevers.  CIR denied patient due to belief he will not tolerate intensity of rehab.  He has debt at local SNF where he could have been placed, unable to get placement.  Patient is unsure of next steps, but states he is open to going to SNF if he can get placement.  Is worried about going home without rehab, states he cannot do so due to shoulder pain.  Objective: Temp:  [97.8 F (36.6 C)-98.9 F (37.2 C)] 98.7 F (37.1 C) (08/07 1056) Pulse Rate:  [80-105] 105 (08/07 1056) Resp:  [17-20] 20 (08/07 1056) BP: (112-138)/(65-74) 128/68 (08/07 1056) SpO2:  [94 %-97 %] 95 % (08/07 1056) Weight:  [77.1 kg] 77.1 kg (08/07 0150)  Physical Exam: General: Age-appropriate, resting comfortably in bed, NAD, alert and at baseline. Cardiovascular: Intermittently tachycardic, regular rhythm. Normal S1/S2. No murmurs, rubs, or gallops appreciated. 2+ radial pulses. Pulmonary: Clear bilaterally to ascultation. No increased WOB, no accessory muscle usage on room air. No wheezes, rales, or crackles. Abdominal: Normoactive bowel sounds,  nondistended. No tenderness to deep or light palpation. Colostomy bag draining liquid stool, no erythema surrounding ostomy. MSK: Deconditioned, muscle wasting in bilateral LE. Extremities: No peripheral edema bilaterally, warm and perfusing extremities.  Laboratory: Most recent CBC Lab Results  Component Value Date   WBC 11.1 (H) 08/26/2022   HGB 7.3 (L) 08/26/2022   HCT 24.6 (L) 08/26/2022   MCV 77.1 (L) 08/26/2022   PLT 518 (H) 08/26/2022   Most recent BMP    Latest Ref Rng & Units 08/26/2022    4:05 AM  BMP  Glucose 70 - 99 mg/dL 742   BUN 6 - 20 mg/dL 10   Creatinine 5.95 - 1.24 mg/dL 6.38   Sodium 756 - 433 mmol/L 136   Potassium 3.5 - 5.1 mmol/L 3.5   Chloride 98 - 111 mmol/L 102   CO2 22 - 32 mmol/L 26   Calcium 8.9 - 10.3 mg/dL 8.6  Other pertinent labs: - None  New Imaging/Diagnostic Tests: - None   , MD 08/27/2022, 12:26 PM [facility]  FMTS Intern pager: [phone], text pages welcome Secure chat group [facility] Teaching Service

## 2022-08-28 DIAGNOSIS — L8945 Pressure ulcer of contiguous site of back, buttock and hip, unstageable: Secondary | ICD-10-CM | POA: Diagnosis not present

## 2022-08-28 DIAGNOSIS — A499 Bacterial infection, unspecified: Secondary | ICD-10-CM

## 2022-08-28 DIAGNOSIS — M868X8 Other osteomyelitis, other site: Secondary | ICD-10-CM | POA: Diagnosis not present

## 2022-08-28 DIAGNOSIS — Z933 Colostomy status: Secondary | ICD-10-CM | POA: Diagnosis not present

## 2022-08-28 DIAGNOSIS — R7881 Bacteremia: Secondary | ICD-10-CM | POA: Diagnosis not present

## 2022-08-28 DIAGNOSIS — Q059 Spina bifida, unspecified: Secondary | ICD-10-CM | POA: Diagnosis not present

## 2022-08-28 DIAGNOSIS — L8990 Pressure ulcer of unspecified site, unspecified stage: Secondary | ICD-10-CM | POA: Diagnosis not present

## 2022-08-28 MED ORDER — FLUCONAZOLE 200 MG PO TABS
400.0000 mg | ORAL_TABLET | Freq: Every day | ORAL | Status: DC
  Administered 2022-08-28 – 2022-09-01 (×5): 400 mg via ORAL
  Filled 2022-08-28 (×5): qty 2

## 2022-08-28 NOTE — Progress Notes (Signed)
[facility]    Date of Admission:  07/25/2022              ID: Anthony [name] is a 52 y.o. male with  pelvic osteomyelitis/pressure ulcer s/p debridement and s/p diverting colostomy Principal Problem:   Acute osteomyelitis of pelvic region and thigh [facility]) Active Problems:   Acute Right shoulder pain   Decubitus ulcer due to spina bifida (HCC)   Suprapubic catheter (HCC)   Anemia of infection and chronic disease   Acute deep vein thrombosis (DVT) of axillary vein of right upper extremity (HCC)   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   Pressure injury of contiguous region involving buttock and hip, stage 4 (HCC)    Subjective: afebrile  Medications:   apixaban  5 mg Oral BID   ascorbic acid  500 mg Oral BID   Chlorhexidine Gluconate Cloth  6 each Topical Daily   feeding supplement  237 mL Oral TID BM   flecainide  50 mg Oral BID   fluconazole  400 mg Oral Daily   gabapentin  100 mg Oral TID   hydrocerin   Topical BID   lactulose  10 g Oral BID   lidocaine  1-2 patch Transdermal Q24H   loratadine  10 mg Oral Daily   methocarbamol  1,000 mg Oral Q8H   multivitamin with minerals  1 tablet Oral Daily   psyllium  1 packet Oral Daily   senna  1 tablet Oral Daily   sodium chloride flush  10-40 mL Intracatheter Q12H   Vashe Wound Irrigation Optime  34 oz Topical Once   zinc sulfate  220 mg Oral Daily    Objective: Vital signs in last 24 hours: Temp:  [98.2 F (36.8 C)-99.2 F (37.3 C)] 99.2 F (37.3 C) (08/08 1322) Pulse Rate:  [30-113] 108 (08/08 1322) Resp:  [17-20] 20 (08/08 1322) BP: (120-132)/(67-82) 132/80 (08/08 1322) SpO2:  [93 %-98 %] 98 % (08/08 1322) Weight:  [78 kg] 78 kg (08/08 0332) Physical Exam  Constitutional: He is oriented to person, place, and time. He appears well-developed and well-nourished. No distress.  HENT:  Mouth/Throat: Oropharynx is clear and moist. No oropharyngeal exudate.  Cardiovascular: Normal rate, regular rhythm and  normal heart sounds. Exam reveals no gallop and no friction rub.  No murmur heard.  Pulmonary/Chest: Effort normal and breath sounds normal. No respiratory distress. He has no wheezes.  Abdominal: Soft. Bowel sounds are normal. He exhibits no distension. There is no tenderness. Ostomy in place Lymphadenopathy:  He has no cervical adenopathy.  Neurological: He is alert and oriented to person, place, and time.  Skin: Skin is warm and dry. No rash noted. No erythema.  Psychiatric: He has a normal mood and affect. His behavior is normal.    Lab Results Recent Labs    08/26/22 0405 08/28/22 0500  WBC 11.1* 10.5  HGB 7.3* 7.2*  HCT 24.6* 24.6*  NA 136  --   K 3.5  --   CL 102  --   CO2 26  --   BUN 10  --   CREATININE 0.43*  --    Liver Panel Recent Labs    08/26/22 0405  PROT 6.8  ALBUMIN 1.7*  AST 24  ALT 26  ALKPHOS 106  BILITOT 0.6   Lab Results  Component Value Date   ESRSEDRATE 140 (H) 08/06/2022    Microbiology: 7/31: Enterococcal and c.orthopsilosis Studies/Results: No results found.   Assessment/Plan: Pelvic  deep tissue pressure ulcer/osteomyelitis = will recommend a total of 6 wk of IV daptomycin using 8/1 as day 1 in addition would do fluconazole 400mg  po daily to complete treatment course. EOT sept 12th.  Continue with local wound care and off loading ------------------------------------ Diagnosis: Pelvic pressure ulcer/pelvic osteomyelitis  Culture Result: as above  Allergies  Allergen Reactions   Firvanq [Vancomycin] Itching    OPAT Orders Discharge antibiotics to be given via PICC line Discharge antibiotics: Per pharmacy protocol daptomycin at 8mg /kg per day -- daptomycin 600mg  iv daily plus fluconazole 400mg  po daily  Duration: 6 wk End Date: Sept 12th  [facility] Care Per Protocol:  Home health RN for IV administration and teaching; PICC line care and labs.    Labs weekly while on IV antibiotics: x__ CBC with differential __x CMP _x_  CRP __x ESR _x_ CK  __x Please pull PIC at completion of IV antibiotics  Fax weekly labs to [phone]  Clinic Follow Up Appt: - due to paraplegia -- will do video visit in 4 wk  @ [facility]   [facility] for Infectious Diseases Pager: [phone]  08/28/2022, 3:54 PM

## 2022-08-28 NOTE — Progress Notes (Signed)
.    Right ischial and right sacral wound has copious purulent drainage. Dressing changed TID per wound care order and moist gauze packing with VASHE wound cleanser.. Left hip has minimal serous drainage. Pt was tolerated well. He denied pain during dressing changed. Plan of care reviewed. Pt has been progressing. Stable hemodynamically, afebrile, NSR/ ST on the monitor. No acute distress noted.Marland Kitchen He has slept well overnight with no complaints.  We will continue to monitor.   Filiberto Pinks, RN

## 2022-08-28 NOTE — Progress Notes (Signed)
Mobility Specialist Progress Note:   08/28/22 1617  Mobility  Activity Transferred from chair to bed  Level of Assistance Total care (+3)  Assistive Device None  RLE Weight Bearing NWB  LLE Weight Bearing NWB  Activity Response Tolerated well  $Mobility charge 1 Mobility  Mobility Specialist Start Time (ACUTE ONLY) 1540  Mobility Specialist Stop Time (ACUTE ONLY) 1555  Mobility Specialist Time Calculation (min) (ACUTE ONLY) 15 min   Pt received in chair, requesting to be transferred to bed upon RN request. Total assist +3 with passive lateral scoot. Pt left in bed with call bell in hand and all needs met. RN notified.    Leory Plowman  Mobility Specialist Please contact via [employer] office at [phone]

## 2022-08-28 NOTE — TOC Progression Note (Signed)
Transition of Care [facility]) - Progression Note    Patient Details  Name: Anthony [name] MRN: [id number] Date of Birth: 02/10/1970  Transition of Care [facility]) CM/SW Contact  Eduard Roux, [place] Phone Number: 08/28/2022, 9:31 AM  Clinical Narrative:     Patient has no bed offers at this time- sent SNF referrals to surrounding counties.   TOC will continue to follow and assist with discharge planning.   [name], MSW, LCSW Clinical Social Worker    Expected Discharge Plan: Skilled Nursing Facility Barriers to Discharge: Continued Medical Work up  Expected Discharge Plan and Services In-house Referral: Clinical Social Work   Post Acute Care Choice: Skilled Nursing Facility Living arrangements for the past 2 months: Single Family Home                                       Social Determinants of Health (SDOH) Interventions SDOH Screenings   Food Insecurity: No Food Insecurity (07/26/2022)  Housing: Low Risk  (07/26/2022)  Transportation Needs: No Transportation Needs (07/26/2022)  Utilities: Not At Risk (07/26/2022)  Alcohol Screen: Low Risk  (01/23/2022)  Depression (PHQ2-9): Low Risk  (02/27/2022)  Financial Resource Strain: Medium Risk (01/23/2022)  Physical Activity: Inactive (01/23/2022)  Social Connections: Socially Isolated (01/23/2022)  Stress: Stress Concern Present (01/23/2022)  Tobacco Use: Medium Risk (08/20/2022)    Readmission Risk Interventions     No data to display

## 2022-08-28 NOTE — Progress Notes (Signed)
Daily Progress Note Intern Pager: [phone]  Patient name: Anthony [name] Medical record number: [id number] Date of birth: 1970/12/28 Age: 52 y.o. Gender: male  Primary Care Provider: Erick Alley, DO Consultants: Infectious Disease Code Status: FULL  Pt Overview and Major Events to Date:  7/5: Admitted 7/6: Ortho consulted, stated no acute intervention indicated. ID consulted, recommended BSA bx 7/7: Hg dropped and patient received 1x pRBCs  7/8: Hg dropped again, 1x pRBCs, [name] ordered per ID recs  7/9: Replaced catheter 7/12: Received 1x pRBC 7/15: [name] - LVEF 55-60%, normal function 7/19: Surgery cancelled due to IT issues/outage 7/23: Diverting colostomy 7/25: Switched to PO abx - Ciprofloxacin and Doxycycline x10 days 7/26: Switched Lovenox to Eliquis 7/29: Switched back to IV abx - IV Daptomycin and IV Zosyn 7/30: Switched Zosyn to Meropenem 7/31: Bilateral hip excisional debridement & culture + 1x pRBC 8/2: Colostomy bridge removed at bedside by general surgery 8/4: Started micafungin, blood Cx obtained 8/6: Stopped meropenem   Assessment and Plan: Anthony [name] is a 52 y.o. male with a pertinent PMH of spina bifida with paraplegia and chronic sacral ulcers who presented with infected sacral ulcers and was admitted for septic shock secondary to osteomyelitis, currently s/p diverting colostomy 7/23 and bilateral hip debridement 7/31. Currently working on dispo/placement.  [facility]     * (Principal) Acute osteomyelitis of pelvic region and thigh (HCC)     Polymycrobial infection, multiple waves of treatment and refevering.   Afebrile since 8/3, continuing Abx course. - Continue IV Daptomycin (7/30-) and micafungin (8/4-) for 4 weeks from  start  - Awaiting sensitivities, may be able to switch micafungin to fluconazole  PO - Pain regimen: Tylenol 650 mg q6h PRN, Oxycodone 5 mg q4h PRN for  moderate pain, Gabapentin 100 TID scheduled, Robaxin 1000 mg q8h  scheduled - Monitor fever curve        Acute Right shoulder pain     Continues to complain of pain, limited ROM, but improving with PT.  XR  negative for fracture 7/5.  Likely OA component. - DG shoulder right without fracture or acute process - PT following, seen daily - Multimodal pain management - Roxabin, Voltaren (now scheduled),  Lidoderm, Muscle rub cream, and PRN Tylenol - Consider shoulder injection inpatient if persistent         Decubitus ulcer due to spina bifida [facility])     S/p surgical debridement with ortho on 7/31. Surgical cultures now with  rare E. Faecium (resistant to ampicillin) and candida.  Dressing changed  yesterday. - Antibiotics as above - Per ortho (Dr. [name]): Difficulty with the wound VAC seal and drainage  overnight 7/31. Currently on wet to dry dressings - PT on board - Wound care on board, addressing foot wounds as well        Suprapubic catheter (HCC)     SPC changed 8/4 by day team. UOP 450 mL yesterday per chart. - Monitor IOs        Anemia of infection and chronic disease     Has received 4U pRBCS during this admission.  Hemoglobin stable now. - Requires blood from [facility] d/t ANTI e (1 units in house). Type and  Screen last performed 7/30 - If Hgb declines and patient requires additional transfusions, consider  d/c'ing Eliquis - Monitor H&H daily        Acute deep vein thrombosis (DVT) of axillary vein of right upper  extremity (HCC)  DVT noted on R upper extremity 7/12.  No shortness of breath, chest  pain today. - On Eliquis 5mg  BID for 3 months, held post surgery d/t blood loss and  risk of bleeding but restarted on 8/1 - Continue to monitor        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     POD 12 diverting colostomy by Dr. Dwain Sarna on 7/23. Bridge removed  8/2, surgery signed off.  Colostomy bag intact and with liquid Bazin  stool. 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  Barriers include IV antibiotics for potentially 4 weeks and placement.  Subjective:  Patient states he is ready to go to SNF if there is availability.  Happy to continue working with PT inpatient.  Endorses improvement of R shoulder pain and ROM, still not adequate for going home.  Optimistic attitude overall.  Objective: Temp:  [98.2 F (36.8 C)-99.2 F (37.3 C)] 99.2 F (37.3 C) (08/08 1322) Pulse Rate:  [30-113] 108 (08/08 1322) Resp:  [17-20] 20 (08/08 1322) BP: (120-142)/(67-87) 132/80 (08/08 1322) SpO2:  [93 %-100 %] 98 % (08/08 1322) Weight:  [78 kg] 78 kg (08/08 0332)  Physical Exam: General: Adult male, paraplegic, resting comfortably in bed, NAD, WNWD, alert and at baseline. Cardiovascular: Regular rate and rhythm. Normal S1/S2. No murmurs, rubs, or gallops appreciated. 2+ radial pulses. Pulmonary: Clear bilaterally to ascultation. No increased WOB, no accessory muscle usage on room air. No wheezes, rales, or crackles. Skin: Warm and dry. No rashes grossly. MSK: Muscular and fatty loss/deconditioning in BLE.  Passive ROM limited by pain in R shoulder, active ROM full range.  Struggles to keep R arm abducted >90 degrees when raised and dropped. Extremities: No peripheral edema bilaterally. 2+ DP pulses bilaterally, extremities well-perfused. Feet in protective heel boots.  Laboratory: Most recent CBC Lab Results  Component Value Date   WBC 10.5 08/28/2022   HGB 7.2 (L) 08/28/2022   HCT 24.6 (L) 08/28/2022   MCV 78.1 (L) 08/28/2022   PLT 550 (H) 08/28/2022   Most recent BMP    Latest Ref Rng & Units 08/26/2022    4:05 AM  BMP  Glucose 70 - 99 mg/dL 161   BUN 6 - 20 mg/dL 10   Creatinine 0.96 - 1.24 mg/dL 0.45   Sodium 409 - 811  mmol/L 136   Potassium 3.5 - 5.1 mmol/L 3.5   Chloride 98 - 111 mmol/L 102   CO2 22 - 32 mmol/L 26   Calcium 8.9 - 10.3 mg/dL 8.6     Other pertinent labs: - None  New Imaging/Diagnostic Tests: - None   , MD 08/28/2022, 1:32 PM [facility]  FMTS Intern pager: [phone], text pages welcome Secure chat group [facility] Teaching Service

## 2022-08-28 NOTE — TOC Progression Note (Signed)
Transition of Care [facility]) - Progression Note    Patient Details  Name: Anthony [name] MRN: [id number] Date of Birth: [birth year]  Transition of Care [facility]) CM/SW Contact  Eduard Roux, [place] Phone Number: 08/28/2022, 2:30 PM  Clinical Narrative:     2:30 pm -Return call back to patient- he was agreeable to [facility]. Explained will submit insurance authorization for approval. Auth submitted Reference #  [id number]   1:30 pm- [name] informed patient of bed offer with [facility]- he requested to disucss with his mother before making a decision.  10:10 am-Contacted [employer], they confirmed offer- they are aware patient has been to [facility]  and does have a balance.     Expected Discharge Plan: Skilled Nursing Facility Barriers to Discharge: Continued Medical Work up  Expected Discharge Plan and Services In-house Referral: Clinical Social Work   Post Acute Care Choice: Skilled Nursing Facility Living arrangements for the past 2 months: Single Family Home                                       Social Determinants of Health (SDOH) Interventions SDOH Screenings   Food Insecurity: No Food Insecurity (07/26/2022)  Housing: Low Risk  (07/26/2022)  Transportation Needs: No Transportation Needs (07/26/2022)  Utilities: Not At Risk (07/26/2022)  Alcohol Screen: Low Risk  (01/23/2022)  Depression (PHQ2-9): Low Risk  (02/27/2022)  Financial Resource Strain: Medium Risk (01/23/2022)  Physical Activity: Inactive (01/23/2022)  Social Connections: Socially Isolated (01/23/2022)  Stress: Stress Concern Present (01/23/2022)  Tobacco Use: Medium Risk (08/20/2022)    Readmission Risk Interventions     No data to display

## 2022-08-28 NOTE — Progress Notes (Signed)
Occupational Therapy Treatment Patient Details Name: Anthony [name] MRN: [id number] DOB: [birth year] Today's Date: 08/28/2022   History of present illness Pt is a 52 y.o. male who presented 07/25/22 with weakness and a fall. Pt admitted with severe sepsis secondary to sacral decubitus ulcer infection with concern for hip septic arthritis noted on CT. Imaging of R shoulder showed moderate acromioclavicular OA & mild distal lasteral subacromial spurring. S/p colostomy 7/23. Pt now bilateral hip excisional debridement, with excision skin and soft tissue muscle fascia and bone on 7/31. PMH: complete paraplegia, multiple toe amputations, chronic sacral wounds, neurogenic bladder with suprapubic catheter, spina bifida, HTN   OT comments  Pt reports transfer back to bed yesterday from recliner "did not go well." Instructed and practiced A-P transfer bed<>chair with min assist. Recommended pt only remain up in chair until he eats and then ask to return to bed before he gets too fatigued. Educated pt in importance of him instructing nursing staff how to set him up and assist him with transfers. Completed UB dressing with min assist and seated grooming at sink with set up, supporting R elbow on countertop. Pt left up in chair with call button and needs met.       If plan is discharge home, recommend the following:  A little help with walking and/or transfers;A lot of help with bathing/dressing/bathroom;Assistance with cooking/housework;Assist for transportation;Help with stairs or ramp for entrance;Supervision due to cognitive status   Equipment Recommendations  Hospital bed    Recommendations for Other Services      Precautions / Restrictions Precautions Precautions: Fall;Other (comment) Precaution Comments: paraplegia, colostomy, suprapubic catheter Restrictions Weight Bearing Restrictions: No       Mobility Bed Mobility Overal bed mobility: Needs Assistance Bed Mobility: Supine to Sit      Supine to sit: Mod assist     General bed mobility comments: mod assist to long sit in bed in preparation for A-P transfer to chair    Transfers Overall transfer level: Needs assistance   Transfers: Bed to chair/wheelchair/BSC         Anterior-Posterior transfers: Min assist   General transfer comment: use of bed pad under hips     Balance Overall balance assessment: Needs assistance   Sitting balance-Leahy Scale: Poor                                     ADL either performed or assessed with clinical judgement   ADL Overall ADL's : Needs assistance/impaired     Grooming: Sitting;Wash/dry hands;Wash/dry face;Set up Grooming Details (indicate cue type and reason): at sink, supported R elbow on sink         Upper Body Dressing : Minimal assistance;Bed level                          Extremity/Trunk Assessment Upper Extremity Assessment RUE Deficits / Details: 45 degrees active FF in sitting, full AROM in supine            Vision       Perception     Praxis      Cognition Arousal: Alert Behavior During Therapy: WFL for tasks assessed/performed Overall Cognitive Status: Within Functional Limits for tasks assessed  General Comments: Pt with less tendency to direct his care as previously. Encouraged him to advocate for himself with transfers back to bed with nursing staff.        Exercises      Shoulder Instructions       General Comments      Pertinent Vitals/ Pain       Pain Assessment Pain Assessment: Faces Faces Pain Scale: Hurts little more Pain Location: R shoulder with mobility Pain Descriptors / Indicators: Discomfort, Grimacing, Guarding Pain Intervention(s): Monitored during session, Repositioned  Home Living                                          Prior Functioning/Environment              Frequency  Min 1X/week        Progress  Toward Goals  OT Goals(current goals can now be found in the care plan section)  Progress towards OT goals: Progressing toward goals  Acute Rehab OT Goals OT Goal Formulation: With patient Time For Goal Achievement: 09/10/22 Potential to Achieve Goals: Good  Plan Discharge plan remains appropriate    Co-evaluation                 AM-PAC OT "6 Clicks" Daily Activity     Outcome Measure   Help from another person eating meals?: None Help from another person taking care of personal grooming?: A Little Help from another person toileting, which includes using toliet, bedpan, or urinal?: A Lot Help from another person bathing (including washing, rinsing, drying)?: A Lot Help from another person to put on and taking off regular upper body clothing?: A Little Help from another person to put on and taking off regular lower body clothing?: Total 6 Click Score: 15    End of Session    OT Visit Diagnosis: Muscle weakness (generalized) (M62.81);Pain   Activity Tolerance Patient tolerated treatment well   Patient Left in chair;with call bell/phone within reach   Nurse Communication          Time: 4010-2725 OT Time Calculation (min): 32 min  Charges: OT General Charges $OT Visit: 1 Visit OT Treatments $Self Care/Home Management : 23-37 mins  Berna Spare, OTR/L Acute Rehabilitation Services Office: [phone]   Evern Bio 08/28/2022, 12:29 PM

## 2022-08-29 DIAGNOSIS — Z933 Colostomy status: Secondary | ICD-10-CM | POA: Diagnosis not present

## 2022-08-29 LAB — PREPARE RBC (CROSSMATCH)

## 2022-08-29 LAB — BPAM RBC
Blood Product Expiration Date: 202408222359
Blood Product Expiration Date: 202408222359
ISSUE DATE / TIME: 202408091931
Unit Type and Rh: 5100
Unit Type and Rh: 5100

## 2022-08-29 LAB — TYPE AND SCREEN
ABO/RH(D): O POS
Antibody Screen: POSITIVE
DAT, IgG: NEGATIVE
Donor AG Type: NEGATIVE
Donor AG Type: NEGATIVE
Unit division: 0
Unit division: 0

## 2022-08-29 MED ORDER — SODIUM CHLORIDE 0.9% IV SOLUTION: Freq: Once | INTRAVENOUS | Status: AC

## 2022-08-29 NOTE — NC FL2 (Signed)
[place] MEDICAID FL2 LEVEL OF CARE FORM     IDENTIFICATION  Patient Name: Anthony [name] Birthdate: 09-Nov-1970 Sex: male Admission Date (Current Location): 07/25/2022  [facility] and [place] Number:  Producer, television/film/video and Address:  [facility] [place], [address] [place], [place] [address]      Provider Number: [id number]  Attending Physician Name and Address:  Nestor Ramp, MD  Relative Name and Phone Number:       Current Level of Care: Hospital Recommended Level of Care: Skilled Nursing Facility Prior Approval Number:    Date Approved/Denied:   PASRR Number: [id number]  Discharge Plan: SNF    Current Diagnoses: Patient Active Problem List   Diagnosis Date Noted   Polymicrobial bacterial infection 08/28/2022   Pressure injury of contiguous region involving left buttock and hip, unstageable (HCC) 08/17/2022   Pressure injury of contiguous region involving buttock and hip, stage 4 (HCC) 08/17/2022   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 08/16/2022   Pressure injury, stage 4, with infection (HCC) 08/05/2022   Pressure injury of contiguous region involving left buttock and hip, stage 4 (HCC) 08/03/2022   Acute deep vein thrombosis (DVT) of axillary vein of right upper extremity (HCC) 08/02/2022   Cough 07/29/2022   Anemia of infection and chronic disease 07/26/2022   Acute osteomyelitis of pelvic region and thigh (HCC) 07/26/2022   Chronic osteomyelitis of pelvic region (HCC) 07/26/2022   Pyogenic arthritis of left hip (HCC)    Septic arthritis of bilateral hips (HCC) 09/03/2021   Neurogenic bladder 03/06/2017   Suprapubic catheter (HCC) 03/06/2017   Paraplegia (HCC) 01/29/2017   Decubitus ulcer due to spina bifida (HCC) 01/27/2017   Hyperlipidemia 03/11/2014   Spina bifida of lumbar region (HCC) 09/30/2012   Acute Right shoulder pain 01/27/2011   ALLERGIC RHINITIS 06/01/2007   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   VENOUS INSUFFICIENCY, CHRONIC 03/19/2006     Orientation RESPIRATION BLADDER Height & Weight     Self, Time, Situation, Place  Normal Continent (suprapubic cath) Weight: 180 lb (81.6 kg) Height:  5\' 8"  (172.7 cm)  BEHAVIORAL SYMPTOMS/MOOD NEUROLOGICAL BOWEL NUTRITION STATUS      Colostomy    AMBULATORY STATUS COMMUNICATION OF NEEDS Skin   Extensive Assist Verbally Normal                       Personal Care Assistance Level of Assistance  Bathing, Feeding, Dressing Bathing Assistance: Limited assistance Feeding assistance: Limited assistance Dressing Assistance: Limited assistance     Functional Limitations Info  Sight, Hearing, Speech Sight Info: Impaired Hearing Info: Impaired Speech Info: Impaired    SPECIAL CARE FACTORS FREQUENCY  PT (By licensed PT), OT (By licensed OT)                    Contractures Contractures Info: Not present    Additional Factors Info  Code Status Code Status Info: FULL CODE             Current Medications (08/29/2022):  This is the current hospital active medication list Current Facility-Administered Medications  Medication Dose Route Frequency Provider Last Rate Last Admin   acetaminophen (TYLENOL) tablet 650 mg  650 mg Oral Q6H PRN Alicia Amel, MD   650 mg at 08/29/22 3244   apixaban (ELIQUIS) tablet 5 mg  5 mg Oral BID Hindel, Leah, MD   5 mg at 08/29/22 1028   ascorbic acid (VITAMIN C) tablet 500 mg  500  mg Oral BID [name], MD   500 mg at 08/29/22 1028   Chlorhexidine Gluconate Cloth 2 % PADS 6 each  6 each Topical Daily [name], MD   6 each at 08/29/22 0700   DAPTOmycin (CUBICIN) 600 mg in sodium chloride 0.9 % IVPB  8 mg/kg Intravenous Q1400 [name], MD 124 mL/hr at 08/28/22 1723 600 mg at 08/28/22 1723   diclofenac Sodium (VOLTAREN) 1 % topical gel 4 g  4 g Topical QID PRN [name], MD   4 g at 08/27/22 2216   feeding supplement (ENSURE ENLIVE / ENSURE PLUS) liquid 237 mL  237 mL Oral TID BM [name], MD   237 mL at  08/29/22 1029   flecainide (TAMBOCOR) tablet 50 mg  50 mg Oral BID [name], MD   50 mg at 08/29/22 1031   fluconazole (DIFLUCAN) tablet 400 mg  400 mg Oral Daily Judyann Munson, MD   400 mg at 08/29/22 1027   gabapentin (NEURONTIN) capsule 100 mg  100 mg Oral TID [name], MD   100 mg at 08/29/22 1027   guaiFENesin-dextromethorphan (ROBITUSSIN DM) 100-10 MG/5ML syrup 15 mL  15 mL Oral Q4H PRN [name], MD   15 mL at 08/24/22 2308   hydrocerin (EUCERIN) cream   Topical BID Carney Living, MD   Given at 08/28/22 2130   hydrOXYzine (ATARAX) tablet 25 mg  25 mg Oral BID PRN [name], MD   25 mg at 08/28/22 2042   ibuprofen (ADVIL) tablet 600 mg  600 mg Oral Q6H PRN Lockie Mola, MD   600 mg at 08/23/22 1522   lactulose (CHRONULAC) 10 GM/15ML solution 10 g  10 g Oral BID [name], MD   10 g at 08/29/22 1027   lidocaine (LIDODERM) 5 % 1-2 patch  1-2 patch Transdermal Q24H [name], MD   1 patch at 08/16/22 2254   loratadine (CLARITIN) tablet 10 mg  10 mg Oral Daily [name], MD   10 mg at 08/29/22 1027   magnesium sulfate IVPB 2 g 50 mL  2 g Intravenous Daily PRN [name], MD       methocarbamol (ROBAXIN) tablet 1,000 mg  1,000 mg Oral Q8H [name], MD   1,000 mg at 08/29/22 6213   multivitamin with minerals tablet 1 tablet  1 tablet Oral Daily [name], MD   1 tablet at 08/29/22 1027   Muscle Rub CREA   Topical PRN [name], MD       Oral care mouth rinse  15 mL Mouth Rinse PRN [name], MD       oxyCODONE (Oxy IR/ROXICODONE) immediate release tablet 5 mg  5 mg Oral Q4H PRN Alicia Amel, MD   5 mg at 08/25/22 1416   prochlorperazine (COMPAZINE) injection 10 mg  10 mg Intravenous Q6H PRN [name], MD   10 mg at 08/21/22 2325   psyllium (HYDROCIL/METAMUCIL) 1 packet  1 packet Oral Daily [name], MD   1 packet at 08/29/22 1027   senna (SENOKOT) tablet 8.6 mg  1 tablet Oral Daily [name], MD   8.6 mg  at 08/29/22 1027   simethicone (MYLICON) chewable tablet 80 mg  80 mg Oral Q6H PRN [name], MD   80 mg at 08/25/22 1416   sodium chloride (OCEAN) 0.65 % nasal spray  1 spray  1 spray Each Nare PRN Carney Living, MD   1 spray at 08/21/22 2240   sodium chloride flush (NS) 0.9 % injection 10-40 mL  10-40 mL Intracatheter Q12H [name], MD   10 mL at 08/28/22 2130   sodium chloride flush (NS) 0.9 % injection 10-40 mL  10-40 mL Intracatheter PRN [name], MD       Vashe Wound Irrigation Optime  34 oz Topical Once [name], MD       zinc sulfate capsule 220 mg  220 mg Oral Daily [name], MD   220 mg at 08/29/22 1027     Discharge Medications: Please see discharge summary for a list of discharge medications.  Relevant Imaging Results:  Relevant Lab Results:   Additional Information SS# [id number]  Deatra Robinson, [place]

## 2022-08-29 NOTE — Progress Notes (Signed)
Mobility Specialist Progress Note:   08/29/22 1400  Mobility  Activity Transferred from chair to bed  Level of Assistance Moderate assist, patient does 50-74%  Assistive Device None  RLE Weight Bearing NWB  LLE Weight Bearing NWB  Activity Response Tolerated well  Mobility Referral Yes  $Mobility charge 1 Mobility  Mobility Specialist Start Time (ACUTE ONLY) 1334  Mobility Specialist Stop Time (ACUTE ONLY) 1351  Mobility Specialist Time Calculation (min) (ACUTE ONLY) 17 min    Pre Mobility: 113 HR , 96% SpO2 Post Mobility: 108 HR , 95% SpO2  Pt received in chair, requesting to go back to bed. Pt c/o discomfort and lack of stability in R shoulder during left lateral scoot requiring ModA for R side stability.Pt left in bed with call bell in reach and all needs met.   Leory Plowman  Mobility Specialist Please contact via [employer] office at [phone]

## 2022-08-29 NOTE — Consult Note (Signed)
WOC Nurse ostomy follow up Stoma type/location: LUQ colostomy  Stomal assessment/size: 1 3/4" pink moist, slightly oval Peristomal assessment: intact  Treatment options for stomal/peristomal skin: 2" skin barrier ring  Output approximately 50 mls soft Podgorski stool  Ostomy pouching: 2 piece 2 3/4" skin barrier Hart [place] #2), 2 3/4" pouch Hart [place] [phone]) and 2" barrier ring Hart [place] [phone])  Education provided: Patient is able to utilize lock and roll mechanism to empty and close pouch.  Does have difficulty with cleaning spout with toilet paper wick.  R arm pain and mobility has improved but is still a limiting factor. I removed pouch and cleaned around stoma with water moistened washcloth.  Sized stoma at 1 3/4", patient able to cut new skin barrier. Patient used his hand held mirror to stretch new skin barrier ring and place around stoma.  Patient able to place new skin barrier onto ring and attach new pouch to skin barrier.  Patient closed pouch independently.    Patient knows steps to change ostomy pouch however his R arm pain and limited movement is a hindrance to doing entire pouch change independently. Latest DC plan is to go to SNF.    I ordered (4) 2 3/4" skin barriers Hart [place] #2), (4) 2 3/4" pouches Hart [place] [phone]) and (4) 2" barrier rings Hart [place] [phone]) for patients room at this visit.   Enrolled patient in [place] Secure Start Discharge program: Yes  WOC team will continue to follow weekly for ostomy education and support.   Thank you,    Priscella Mann MSN, RN-BC, [employer] [phone]

## 2022-08-29 NOTE — TOC Progression Note (Signed)
Transition of Care [facility]) - Progression Note    Patient Details  Name: Anthony [name] MRN: [id number] Date of Birth: March 02, 1970  Transition of Care [facility]) CM/SW Contact  Dellie Burns [place], [place] Phone Number: 08/29/2022, 10:03 AM  Clinical Narrative:  per [facility]/[employer], auth remains pending for [facility] and is under review. Will provide updates as available.   Dellie Burns, MSW, LCSW [phone] (coverage)       Expected Discharge Plan: Skilled Nursing Facility Barriers to Discharge: Continued Medical Work up  Expected Discharge Plan and Services In-house Referral: Clinical Social Work   Post Acute Care Choice: Skilled Nursing Facility Living arrangements for the past 2 months: Single Family Home                                       Social Determinants of Health (SDOH) Interventions SDOH Screenings   Food Insecurity: No Food Insecurity (07/26/2022)  Housing: Low Risk  (07/26/2022)  Transportation Needs: No Transportation Needs (07/26/2022)  Utilities: Not At Risk (07/26/2022)  Alcohol Screen: Low Risk  (01/23/2022)  Depression (PHQ2-9): Low Risk  (02/27/2022)  Financial Resource Strain: Medium Risk (01/23/2022)  Physical Activity: Inactive (01/23/2022)  Social Connections: Socially Isolated (01/23/2022)  Stress: Stress Concern Present (01/23/2022)  Tobacco Use: Medium Risk (08/20/2022)    Readmission Risk Interventions     No data to display

## 2022-08-29 NOTE — Progress Notes (Addendum)
Daily Progress Note Intern Pager: [phone]  Patient name: Anthony [name] Medical record number: [id number] Date of birth: Mar 30, 1970 Age: 52 y.o. Gender: male  Primary Care Provider: Erick Alley, DO Consultants: Infectious Disease Code Status: FULL  Pt Overview and Major Events to Date:  7/5: Admitted 7/6: Ortho consulted, stated no acute intervention indicated. ID consulted, recommended BSA bx 7/7: Hg dropped and patient received 1x pRBCs  7/8: Hg dropped again, 1x pRBCs, [name] ordered per ID recs  7/9: Replaced catheter 7/12: Received 1x pRBC 7/15: [name] - LVEF 55-60%, normal function 7/19: Surgery cancelled due to IT issues/outage 7/23: Diverting colostomy 7/25: Switched to PO abx - Ciprofloxacin and Doxycycline x10 days 7/26: Switched Lovenox to Eliquis 7/29: Switched back to IV abx - IV Daptomycin and IV Zosyn 7/30: Switched Zosyn to Meropenem 7/31: Bilateral hip excisional debridement & culture + 1x pRBC 8/2: Colostomy bridge removed at bedside by general surgery 8/4: Started micafungin, blood Cx obtained 8/6: Stopped meropenem 8/8: Stopped micafungin, switched to fluconazole PO   Assessment and Plan: Anthony [name] is a 52 y.o. male with a pertinent PMH of spina bifida with paraplegia and chronic sacral ulcers who presented with infected sacral ulcers and was admitted for septic shock secondary to osteomyelitis, currently s/p diverting colostomy 7/23 and bilateral hip debridement 7/31. Currently working on dispo/placement.  [facility]     * (Principal) Acute osteomyelitis of pelvic region and thigh (HCC)     Polymycrobial infection, multiple waves of treatment and refevering.   Afebrile since 8/3, continuing Abx course. - Continue Daptomycin 600 mg IV (7/30), switched micafungin (8/4-8/8) to  fluconazole 400 mg PO, each for 6 weeks from first dose (end planned 9/12) - Monitor fever curve - Daily CBC        Acute Right shoulder pain     Continues to  complain of pain, limited ROM, but improving with PT.  XR  negative for fracture 7/5.  Likely OA component. - PT following, seen daily - Multimodal pain management as above - Consider shoulder injection inpatient if persistent         Decubitus ulcer due to spina bifida [facility])     S/p surgical debridement with ortho on 7/31. Surgical cultures now with  rare E. Faecium (resistant to ampicillin) and candida.  Dressing changed  yesterday. - Antibiotics as above - PT on board, daily visits - Wound care on board, wet to dry dressings, addressing foot wounds as  well - Pain regimen: Tylenol 650 mg q6h PRN, Oxycodone 5 mg q4h PRN for  moderate pain, Gabapentin 100 TID scheduled, Robaxin 1000 mg q8h scheduled        Suprapubic catheter (HCC)     SPC changed 8/4 by day team, good UOP daily. - Monitor IOs        Anemia of infection and chronic disease     Has received 4U pRBCS during this admission.  Hemoglobin slowly  downtrending, now to 7.1. - Administer additional 1 unit PRBC today pending T&S - Type and screen ordered, previous on 7/30 - Requires blood from [facility] d/t ANTI e (1 units in house, verified  with blood bank 8/9) - If Hgb declines and patient requires additional transfusions, consider  d/c'ing Eliquis - Monitor H&H daily        Acute deep vein thrombosis (DVT) of axillary vein of right upper  extremity (HCC)     DVT noted on R upper extremity  7/12.  No shortness of breath, chest  pain today. - On Eliquis 5mg  BID for 3 months, held post surgery d/t blood loss and  risk of bleeding but restarted on 8/1 - Continue to monitor        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     POD 12 diverting colostomy by Dr. Dwain [name] on 7/23. Bridge removed  8/2, surgery signed off.  Colostomy bag intact and with liquid Meinzer  stool. - Regular diet - Bowel regimen: Senna 8.6 mg daily, Psyllium 1 packet daily, and  Lactulose 10 g BID        Pressure injury of contiguous region involving  buttock and hip, stage 4  (HCC)     Polymicrobial bacterial infection   FEN/GI: Regular diet, no fluids PPx: Eliquis Dispo:  SNF pending insurance authorization.  Barriers include IV antibiotics for 4 weeks.  Subjective:  This morning, patient states that he feels ready to go home.  His right shoulder continues to gradually improve small increments.  He states that he is ready to take care of his ostomy and suprapubic catheter himself when discharged.  Is awaiting SNF insurance decision.  Objective: Temp:  [97.8 F (36.6 C)-100.7 F (38.2 C)] 98.8 F (37.1 C) (08/09 1248) Pulse Rate:  [71-114] 97 (08/09 1051) Resp:  [13-27] 13 (08/09 1051) BP: (118-132)/(63-90) 126/90 (08/09 1248) SpO2:  [95 %-100 %] 99 % (08/09 1248) Weight:  [81.6 kg] 81.6 kg (08/09 0500)  Physical Exam: General: Adult male, deconditioned lower extremities, paraplegic, resting in bed, NAD, alert and at baseline. Cardiovascular: Regular rate and rhythm. Normal S1/S2. No murmurs, rubs, or gallops appreciated. 2+ radial pulses. Abdominal: Suprapubic catheter in place, draining clear urine. Ostomy bag in place, draining liquid Bieser stool. Skin: Warm and dry. Dressings on bilateral hips in place. Extremities: No peripheral edema bilaterally. 2+ DP pulses bilaterally. Wearing bilateral heel pressure distributing boots.  Laboratory: Most recent CBC Lab Results  Component Value Date   WBC 11.9 (H) 08/29/2022   HGB 7.1 (L) 08/29/2022   HCT 24.6 (L) 08/29/2022   MCV 79.6 (L) 08/29/2022   PLT 538 (H) 08/29/2022   Most recent BMP    Latest Ref Rng & Units 08/26/2022    4:05 AM  BMP  Glucose 70 - 99 mg/dL 161   BUN 6 - 20 mg/dL 10   Creatinine 0.96 - 1.24 mg/dL 0.45   Sodium 409 - 811 mmol/L 136   Potassium 3.5 - 5.1 mmol/L 3.5   Chloride 98 - 111 mmol/L 102   CO2 22 - 32 mmol/L 26   Calcium 8.9 - 10.3 mg/dL 8.6     Other pertinent labs: - None  New Imaging/Diagnostic Tests: - None   ,  MD 08/29/2022, 1:13 PM [facility]  FMTS Intern pager: [phone], text pages welcome Secure chat group [facility] Teaching Service

## 2022-08-29 NOTE — Progress Notes (Signed)
PHARMACY CONSULT NOTE FOR:  OUTPATIENT  PARENTERAL ANTIBIOTIC THERAPY (OPAT)  Indication: Pelvic osteo Regimen:  Daptomycin 850 mg IV every 24 hours and Fluconazole 400 mg po daily End date: 10/02/22  IV antibiotic discharge orders are pended. To discharging provider:  please sign these orders via discharge navigator,  Select New Orders & click on the button choice - Manage This Unsigned Work.     Thank you for allowing pharmacy to be a part of this patient's care.  Georgina Pillion, PharmD, BCPS, BCIDP Infectious Diseases Clinical Pharmacist 08/29/2022 8:11 AM   **Pharmacist phone directory can now be found on [email or web address] ([email or web address]).  Listed under [facility].

## 2022-08-29 NOTE — Progress Notes (Signed)
Physical Therapy Treatment Patient Details Name: Anthony [name] MRN: [id number] DOB: 01-12-1971 Today's Date: 08/29/2022   History of Present Illness Pt is a 52 y.o. male who presented 07/25/22 with weakness and a fall. Pt admitted with severe sepsis secondary to sacral decubitus ulcer infection with concern for hip septic arthritis noted on CT. Imaging of R shoulder showed moderate acromioclavicular OA & mild distal lasteral subacromial spurring. S/p colostomy 7/23. Pt now bilateral hip excisional debridement, with excision skin and soft tissue muscle fascia and bone on 7/31. PMH: complete paraplegia, multiple toe amputations, chronic sacral wounds, neurogenic bladder with suprapubic catheter, spina bifida, HTN    PT Comments  Pt received in supine and agreeable to session. Pt reporting increased difficulty transferring back to bed with nursing from the recliner yesterday, so this session focused on lateral transfers to/from the recliner. Pt able to transfer to the L side with less R shoulder pain and assist than to the R side. Pt continuing to require cues for set up despite encouraging the pt to advocate for his needs and preferences. Pt continues to demonstrate increased difficulty elevating bottom using RUE due to pain and limited ROM. Pt continues to benefit from PT services to progress toward functional mobility goals.    If plan is discharge home, recommend the following: Two people to help with walking and/or transfers;A lot of help with bathing/dressing/bathroom;Assistance with cooking/housework;Assist for transportation;Help with stairs or ramp for entrance   Can travel by private vehicle        Equipment Recommendations  Hospital bed    Recommendations for Other Services       Precautions / Restrictions Precautions Precautions: Fall;Other (comment) Precaution Comments: paraplegia, colostomy, suprapubic catheter     Mobility  Bed Mobility Overal bed mobility: Needs  Assistance Bed Mobility: Supine to Sit     Supine to sit: Contact guard     General bed mobility comments: Pt able to manage BLE and sit to EOB with CGA for safety. Increased difficulty pushing up with RUE, but no assist needed    Transfers Overall transfer level: Needs assistance Equipment used: None Transfers: Bed to chair/wheelchair/BSC            Lateral/Scoot Transfers: Min assist, Mod assist General transfer comment: Pt transferring from bed <> recliner and back to recliner. min A with bedpad when transferring to L side and mod A when transferring to R side due to increased pain.        Balance Overall balance assessment: Needs assistance Sitting-balance support: Single extremity supported, Feet unsupported Sitting balance-Leahy Scale: Poor Sitting balance - Comments: Sitting EOB. Pt requires UE support to maintain balance                                    Cognition Arousal: Alert Behavior During Therapy: WFL for tasks assessed/performed Overall Cognitive Status: Within Functional Limits for tasks assessed                                          Exercises      General Comments        Pertinent Vitals/Pain Pain Assessment Pain Assessment: Faces Faces Pain Scale: Hurts little more Pain Location: R shoulder with mobility Pain Descriptors / Indicators: Discomfort, Grimacing, Guarding Pain Intervention(s): Monitored during session, Limited activity within  patient's tolerance, Repositioned     PT Goals (current goals can now be found in the care plan section) Acute Rehab PT Goals Patient Stated Goal: home PT Goal Formulation: With patient Time For Goal Achievement: 08/27/22 Potential to Achieve Goals: Good Progress towards PT goals: Progressing toward goals    Frequency    Min 1X/week      PT Plan Current plan remains appropriate       AM-PAC PT "6 Clicks" Mobility   Outcome Measure  Help needed turning  from your back to your side while in a flat bed without using bedrails?: None Help needed moving from lying on your back to sitting on the side of a flat bed without using bedrails?: A Little Help needed moving to and from a bed to a chair (including a wheelchair)?: A Little Help needed standing up from a chair using your arms (e.g., wheelchair or bedside chair)?: Total Help needed to walk in hospital room?: Total Help needed climbing 3-5 steps with a railing? : Total 6 Click Score: 13    End of Session   Activity Tolerance: Patient tolerated treatment well Patient left: in chair;with call bell/phone within reach Nurse Communication: Mobility status PT Visit Diagnosis: Muscle weakness (generalized) (M62.81);Pain Pain - Right/Left: Right Pain - part of body: Shoulder     Time: 2440-1027 PT Time Calculation (min) (ACUTE ONLY): 27 min  Charges:    $Therapeutic Activity: 23-37 mins PT General Charges $$ ACUTE PT VISIT: 1 Visit                     [name], PTA Acute Rehabilitation Services Secure Chat Preferred  [phone]    [name] 08/29/2022, 12:16 PM

## 2022-08-30 DIAGNOSIS — Z933 Colostomy status: Secondary | ICD-10-CM | POA: Diagnosis not present

## 2022-08-30 MED ORDER — ACETAMINOPHEN 325 MG PO TABS
650.0000 mg | ORAL_TABLET | Freq: Four times a day (QID) | ORAL | Status: DC
  Administered 2022-08-30 – 2022-09-01 (×10): 650 mg via ORAL
  Filled 2022-08-30 (×10): qty 2

## 2022-08-30 MED ORDER — OXYCODONE HCL 5 MG PO TABS
5.0000 mg | ORAL_TABLET | Freq: Two times a day (BID) | ORAL | Status: DC | PRN
  Administered 2022-08-30 – 2022-09-01 (×4): 5 mg via ORAL
  Filled 2022-08-30 (×4): qty 1

## 2022-08-30 NOTE — Assessment & Plan Note (Addendum)
Polymycrobial infection, multiple waves of treatment and refevering. Febrile overnight to Tmax 100.7. Patient endorses fever but did not have chills.  - Anitbiotic regemin includes: Daptomycin 600mg  IV q24 to end on 10/02/22 (08/27/22- 10/02/22)  and fluconazole 400 mg PO daily to end on 10/02/22 (08/28/22 - 10/02/22) - scheduled tylenol 650 q6h  - Monitor fever curve - Daily CBC

## 2022-08-30 NOTE — Progress Notes (Addendum)
Daily Progress Note Intern Pager: [phone]  Patient name: Anthony [name] Medical record number: [id number] Date of birth: [birth year] Age: 52 y.o. Gender: male  Primary Care Provider: Erick Alley, DO Consultants: Infectious Disease  Code Status: Full  Pt Overview and Major Events to Date:  7/5: Admitted 7/6: Ortho consulted, stated no acute intervention indicated. ID consulted, recommended BSA bx 7/7: Hg dropped and patient received 1x pRBCs  7/8: Hg dropped again, 1x pRBCs, [name] ordered per ID recs  7/9: Replaced catheter 7/12: Received 1x pRBC 7/15: [name] - LVEF 55-60%, normal function 7/19: Surgery cancelled due to IT issues/outage 7/23: Diverting colostomy 7/25: Switched to PO abx - Ciprofloxacin and Doxycycline x10 days 7/26: Switched Lovenox to Eliquis 7/29: Switched back to IV abx - IV Daptomycin and IV Zosyn 7/30: Switched Zosyn to Meropenem 7/31: Bilateral hip excisional debridement & culture + 1x pRBC 8/2: Colostomy bridge removed at bedside by general surgery 8/4: Started micafungin, blood Cx obtained 8/6: Stopped meropenem 8/8: Stopped micafungin, switched to fluconazole PO  Assessment and Plan: Anthony [name] is a 52 y.o. male with a pertinent PMH of spina bifida with paraplegia and chronic sacral ulcers who presented with infected sacral ulcers and was admitted for septic shock secondary to osteomyelitis, currently s/p diverting colostomy 7/23 and bilateral hip debridement 7/31. Currently working on dispo/placement.   [facility]     * (Principal) Acute osteomyelitis of pelvic region and thigh (HCC)     Polymycrobial infection, multiple waves of treatment and refevering.  Febrile overnight to Tmax 100.7. Patient endorses fever but did not have  chills.  - Anitbiotic regemin includes: Daptomycin 600mg  IV q24 to end on 10/02/22  (08/27/22- 10/02/22)  and fluconazole 400 mg PO daily to end on 10/02/22  (08/28/22 - 10/02/22) - scheduled tylenol 650 q6h  - Monitor  fever curve - Daily CBC        Acute Right shoulder pain     Still has limited ROM of R shoulder. Xray did show degenerative changes  in the acromioclavicular joint. Patient amenable to shoulder injection  prior to d/c.  - PT following, seen daily - Multimodal pain management as above - Consider shoulder injection tomorrow         Decubitus ulcer due to spina bifida [facility])     S/p surgical debridement with ortho on 7/31. Surgical cultures with  rare E. Faecium (resistant to ampicillin) and candida. Patient reports  decreased pain and not requiring oxycodone - Antibiotics as above - PT on board, daily visits - Wound care on board, wet to dry dressings, addressing foot wounds as  well - patient tolerated pain, will decrease oxycodone 5mg  to BID prn  - Pain regimen: Tylenol 650 mg q6h, Oxycodone 5 mg BID PRN for moderate  pain, Gabapentin 100 TID scheduled, Robaxin 1000 mg q8h scheduled        Suprapubic catheter (HCC)     SPC changed 8/4 by day team, good UOP daily. - Monitor IOs        Anemia of infection and chronic disease     Has received 5U pRBCS during this admission. S/p tranfusion 8/9. Post  H&H 8.2 this AM.  - AM CBC - consider IV iron prior to d/c        Acute deep vein thrombosis (DVT) of axillary vein of right upper  extremity (HCC)     DVT noted on R upper extremity 7/12.  No shortness  of breath, chest  pain today. - On Eliquis 5mg  BID for 3 months, held post surgery d/t blood loss and  risk of bleeding but restarted on 8/1 - Continue to monitor        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     POD 12 diverting colostomy by Dr. Dwain Sarna on 7/23. Bridge removed  8/2, surgery signed off.  Colostomy bag intact and with liquid [place]  stool. 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 He reports he did have a fever overnight, but did not have any chills. He reports limited ROM of R shoulder d/t pain but states it is improved from prior. He states he would like to get a shoulder injection if that is possible prior to discharge.  Otherwise, patient has no complaints.   Objective: Temp:  [97.8 F (36.6 C)-100.7 F (38.2 C)] 97.8 F (36.6 C) (08/10 0800) Pulse Rate:  [87-115] 90 (08/10 0800) Resp:  [13-31] 18 (08/10 0800) BP: (98-126)/(61-90) 116/73 (08/10 0800) SpO2:  [96 %-100 %] 100 % (08/10 0800)  Physical Exam: General: Asleep in bed, resting comfortably  Cardiovascular: RRR, no M/R/G Respiratory: breathing comfortably on RA, chronic dry cough  Abdomen: distended, nontender, ostomy in place with soft, [place] stool  Extremities: bilateral lower extremities in compression boots. Wound over bilateral hip covered with clean, dry dressing.  Laboratory: Most recent CBC Lab Results  Component Value Date   WBC 11.4 (H) 08/30/2022   HGB 8.2 (L) 08/30/2022   HCT 27.3 (L) 08/30/2022   MCV 79.1 (L) 08/30/2022   PLT 541 (H) 08/30/2022   Most recent BMP    Latest Ref Rng & Units 08/26/2022    4:05 AM  BMP  Glucose 70 - 99 mg/dL 578   BUN 6 - 20 mg/dL 10   Creatinine 4.69 - 1.24 mg/dL 6.29   Sodium 528 - 413 mmol/L 136   Potassium 3.5 - 5.1 mmol/L 3.5   Chloride 98 - 111 mmol/L 102   CO2 22 - 32 mmol/L 26   Calcium 8.9 - 10.3 mg/dL 8.6     Imaging/Diagnostic Tests: None Georg Ruddle, , MD 08/30/2022, 10:40 AM  PGY-1,  Family Medicine FPTS Intern pager: [phone], text pages welcome Secure chat group [facility] Teaching Service

## 2022-08-30 NOTE — TOC Progression Note (Addendum)
Transition of Care [facility]) - Progression Note    Patient Details  Name: Anthony [name] MRN: [id number] Date of Birth: 1970/11/16  Transition of Care [facility]) CM/SW Contact  Carmina Miller, LCSWA Phone Number: 08/30/2022, 10:03 AM  Clinical Narrative:     [name] spoke with [facility] and [facility] representative, she states pt is still under review but they need PT notes that reflect pt's prior level of functioning as well as pt's documented living setting. [employer] also needs the MD order for IV antibiotics showing name, dosage, frequency and end date. Once clinicals have been updated, they will be sent to [facility]. MD made aware.   Expected Discharge Plan: Skilled Nursing Facility Barriers to Discharge: Continued Medical Work up  Expected Discharge Plan and Services In-house Referral: Clinical Social Work   Post Acute Care Choice: Skilled Nursing Facility Living arrangements for the past 2 months: Single Family Home                                       Social Determinants of Health (SDOH) Interventions SDOH Screenings   Food Insecurity: No Food Insecurity (07/26/2022)  Housing: Low Risk  (07/26/2022)  Transportation Needs: No Transportation Needs (07/26/2022)  Utilities: Not At Risk (07/26/2022)  Alcohol Screen: Low Risk  (01/23/2022)  Depression (PHQ2-9): Low Risk  (02/27/2022)  Financial Resource Strain: Medium Risk (01/23/2022)  Physical Activity: Inactive (01/23/2022)  Social Connections: Socially Isolated (01/23/2022)  Stress: Stress Concern Present (01/23/2022)  Tobacco Use: Medium Risk (08/20/2022)    Readmission Risk Interventions     No data to display

## 2022-08-31 DIAGNOSIS — Z933 Colostomy status: Secondary | ICD-10-CM | POA: Diagnosis not present

## 2022-08-31 LAB — CBC
HCT: 26.4 % — ABNORMAL LOW (ref 39.0–52.0)
Hemoglobin: 7.7 g/dL — ABNORMAL LOW (ref 13.0–17.0)
MCH: 23.2 pg — ABNORMAL LOW (ref 26.0–34.0)
MCHC: 29.2 g/dL — ABNORMAL LOW (ref 30.0–36.0)
MCV: 79.5 fL — ABNORMAL LOW (ref 80.0–100.0)
Platelets: 528 10*3/uL — ABNORMAL HIGH (ref 150–400)
RBC: 3.32 MIL/uL — ABNORMAL LOW (ref 4.22–5.81)
RDW: 21.2 % — ABNORMAL HIGH (ref 11.5–15.5)
WBC: 10.1 10*3/uL (ref 4.0–10.5)
nRBC: 0 % (ref 0.0–0.2)

## 2022-08-31 MED ORDER — SODIUM CHLORIDE 0.9 % IV BOLUS
250.0000 mL | Freq: Once | INTRAVENOUS | Status: AC
  Administered 2022-08-31: 250 mL via INTRAVENOUS

## 2022-08-31 NOTE — Assessment & Plan Note (Signed)
 DVT noted on R upper extremity 7/12.  No shortness of breath, chest pain today. - On Eliquis 5mg  BID for 3 months, held post surgery d/t blood loss and risk of bleeding but restarted on 8/1 - Continue to monitor

## 2022-08-31 NOTE — Progress Notes (Signed)
   08/31/22 1843  Assess: MEWS Score  Temp (!) 102.1 F (38.9 C)  Assess: MEWS Score  MEWS Temp 2  MEWS Systolic 0  MEWS Pulse 1  MEWS RR 0  MEWS LOC 0  MEWS Score 3  MEWS Score Color Yellow  Assess: if the MEWS score is Yellow or Red  Were vital signs accurate and taken at a resting state? Yes  Does the patient meet 2 or more of the SIRS criteria? Yes  Does the patient have a confirmed or suspected source of infection? Yes  MEWS guidelines implemented  Yes, yellow  Treat  MEWS Interventions Considered administering scheduled or prn medications/treatments as ordered  Take Vital Signs  Increase Vital Sign Frequency  Yellow: Q2hr x1, continue Q4hrs until patient remains green for 12hrs  Escalate  MEWS: Escalate Yellow: Discuss with charge nurse and consider notifying provider and/or RRT  Notify: Charge Nurse/RN  Name of Charge Nurse/RN Notified Michelle  Assess: SIRS CRITERIA  SIRS Temperature  1  SIRS Pulse 1  SIRS Respirations  0  SIRS WBC 0  SIRS Score Sum  2

## 2022-08-31 NOTE — Assessment & Plan Note (Addendum)
Has received 5U pRBCS during this admission. S/p tranfusion 8/9. Post H&H 8.2 this AM.  - AM CBC

## 2022-08-31 NOTE — Plan of Care (Signed)
  Problem: Education: Goal: Knowledge of General Education information will improve Description: Including pain rating scale, medication(s)/side effects and non-pharmacologic comfort measures Outcome: Progressing   Problem: Activity: Goal: Risk for activity intolerance will decrease Outcome: Progressing   Problem: Nutrition: Goal: Adequate nutrition will be maintained Outcome: Progressing   Problem: Coping: Goal: Level of anxiety will decrease Outcome: Progressing   

## 2022-08-31 NOTE — Assessment & Plan Note (Signed)
Still has limited ROM of R shoulder, pain with movement. Xray did show degenerative changes in the acromioclavicular joint. - PT following, seen daily - Multimodal pain management as above - R shoulder injection performed, see procedure note

## 2022-08-31 NOTE — Assessment & Plan Note (Signed)
S/p surgical debridement with ortho on 7/31. Surgical cultures with rare E. Faecium (resistant to ampicillin) and candida. Patient reports pain is well controlled. - Antibiotics as above - PT on board, daily visits - Wound care on board, wet to dry dressings, addressing foot wounds as well - Pain regimen: Tylenol 650 mg q6h, Oxycodone 5 mg BID PRN for moderate pain, Gabapentin 100 TID scheduled, Robaxin 1000 mg q8h scheduled

## 2022-08-31 NOTE — Procedures (Signed)
PROCEDURE: R SHOULDER INJECTION: Patient gave oral informed consent. Appropriate time out was taken. Area prepped and draped in usual fashion. A 21 gauge 1.5 inch needle was used.. 1 cc of methylprednisolone 40 mg/ml (LOT# [id number], 09/2023) plus 2 cc of 1% lidocaine without epinephrine was injected into the right shoulder using a posterior subscapular approach.  The patient tolerated the procedure well. There were no complications. Post procedure instructions were given.  Supervised by Dr. Denny Levy.

## 2022-08-31 NOTE — Progress Notes (Signed)
Daily Progress Note Intern Pager: [phone]  Patient name: Anthony [name] Medical record number: [id number] Date of birth: [birth year] Age: 52 y.o. Gender: male  Primary Care Provider: Erick Alley, DO Consultants: Infectious Disease Code Status: FULL  Pt Overview and Major Events to Date:  7/5: Admitted 7/6: Ortho consulted, stated no acute intervention indicated. ID consulted, recommended BSA bx 7/7: Hg dropped and patient received 1x pRBCs  7/8: Hg dropped again, 1x pRBCs, [name] ordered per ID recs  7/9: Replaced catheter 7/12: Received 1x pRBC 7/15: [name] - LVEF 55-60%, normal function 7/19: Surgery cancelled due to IT issues/outage 7/23: Diverting colostomy 7/25: Switched to PO abx - Ciprofloxacin and Doxycycline x10 days 7/26: Switched Lovenox to Eliquis 7/29: Switched back to IV abx - IV Daptomycin and IV Zosyn 7/30: Switched Zosyn to Meropenem 7/31: Bilateral hip excisional debridement & culture + 1x pRBC 8/2: Colostomy bridge removed at bedside by general surgery 8/4: Started micafungin, blood Cx obtained 8/6: Stopped meropenem 8/8: Stopped micafungin, switched to fluconazole PO 8/11: R shoulder steriod injection   Assessment and Plan: Anthony [name] is a 52 y.o. male with a pertinent PMH of spina bifida with paraplegia and chronic sacral ulcers who presented with infected sacral ulcers and was admitted for septic shock secondary to osteomyelitis, currently s/p diverting colostomy 7/23 and bilateral hip debridement 7/31. Working on dispo/placement.  [facility]     * (Principal) Acute osteomyelitis of pelvic region and thigh (HCC)     Polymycrobial infection, multiple waves of treatment and refevering.   Last febrile 8/9. - Anitbiotic regemin includes: Daptomycin 600mg  IV q24 to end on 10/02/22  (8/7-9/12/24) and fluconazole 400 mg PO daily to end on 10/02/22 (8/8-9/12) - Monitor fever curve - Daily CBC        Acute Right shoulder pain     Still has limited  ROM of R shoulder, pain with movement. Xray did show  degenerative changes in the acromioclavicular joint. - PT following, seen daily - Multimodal pain management as above - R shoulder injection performed, see procedure note        Decubitus ulcer due to spina bifida [facility])     S/p surgical debridement with ortho on 7/31. Surgical cultures with  rare E. Faecium (resistant to ampicillin) and candida. Patient reports  pain is well controlled. - Antibiotics as above - PT on board, daily visits - Wound care on board, wet to dry dressings, addressing foot wounds as  well - Pain regimen: Tylenol 650 mg q6h, Oxycodone 5 mg BID PRN for moderate  pain, Gabapentin 100 TID scheduled, Robaxin 1000 mg q8h scheduled        Suprapubic catheter (HCC)     SPC changed 8/4 by day team, good UOP daily. - Monitor IOs        Anemia of infection and chronic disease     Has received 5U pRBCS during this admission. S/p tranfusion 8/9. Post  H&H 8.2 this AM.  - AM CBC        Acute deep vein thrombosis (DVT) of axillary vein of right upper  extremity (HCC)     DVT noted on R upper extremity 7/12.  No shortness of breath, chest  pain today. - On Eliquis 5mg  BID for 3 months, held post surgery d/t blood loss and  risk of bleeding but restarted on 8/1 - Continue to monitor        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  POD 12 diverting colostomy by Dr. Dwain Sarna on 7/23. Bridge removed  8/2, surgery signed off.  Colostomy bag intact and with liquid [place]  stool. 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  No fevers or chills past 24 hours.  Eating well, ready to go to SNF when placement available.  Is interested in a shoulder injection today.  Objective: Temp:  [98 F (36.7 C)-99.4 F (37.4 C)] 98.7 F (37.1 C) (08/11 1253) Pulse Rate:  [84-109] 103 (08/11 1253) Resp:  [17-20] 20 (08/11 1457) BP: (99-127)/(57-82) 99/67 (08/11 1253) SpO2:  [95 %-98 %] 96 % (08/11 1253) Weight:  [78.9 kg] 78.9 kg (08/11 0700)  Physical Exam: General: Adult male, paraplegic, resting in bed, NAD, alert and at baseline. Cardiovascular: Tachycardic, regular rhythm. Normal S1/S2. No murmurs, rubs, or gallops appreciated. 2+ radial pulses. Pulmonary: Clear bilaterally to ascultation. No increased WOB, no accessory muscle usage on room air. No wheezes, rales, or crackles. Abdominal: Ostomy in place, no granulation tissue visible, seal in place and draining liquid stool.  Suprapubic catheter in place, draining light yellow urine, no sediment. MSK: Muscle and fatty wasting of BLE, deconditioned.  Active ROM of R shoulder limited by pain, full range passive ROM. Skin: Warm and dry.  No rashes grossly. Extremities: No peripheral edema bilaterally. 2+ DP pulses bilaterally.  Laboratory: Most recent CBC Lab Results  Component Value Date   WBC 10.1 08/31/2022   HGB 7.7 (L) 08/31/2022   HCT 26.4 (L) 08/31/2022   MCV 79.5 (L) 08/31/2022   PLT 528 (H) 08/31/2022   Most recent BMP    Latest Ref Rng & Units 08/26/2022    4:05 AM  BMP  Glucose 70 - 99 mg/dL 401   BUN 6 - 20 mg/dL 10   Creatinine 0.27 - 1.24 mg/dL 2.53   Sodium 664 - 403 mmol/L 136   Potassium 3.5 - 5.1 mmol/L 3.5   Chloride 98 - 111 mmol/L 102   CO2 22 - 32 mmol/L 26   Calcium 8.9 - 10.3 mg/dL 8.6     Other pertinent labs: - None  New Imaging/Diagnostic Tests: - None   , MD 08/31/2022, 3:09 PM [facility]  FMTS Intern pager: [phone], text pages welcome Secure chat group [facility] Teaching Service

## 2022-08-31 NOTE — Assessment & Plan Note (Signed)
SPC changed 8/4 by day team, good UOP daily. - Monitor IOs

## 2022-08-31 NOTE — Progress Notes (Signed)
   08/31/22 1253  Assess: MEWS Score  Temp 98.7 F (37.1 C)  BP 99/67  MAP (mmHg) 77  Pulse Rate (!) 103  ECG Heart Rate (!) 103  Resp 20  Level of Consciousness Alert  SpO2 96 %  O2 Device Room Air  Assess: MEWS Score  MEWS Temp 0  MEWS Systolic 1  MEWS Pulse 1  MEWS RR 0  MEWS LOC 0  MEWS Score 2  MEWS Score Color Yellow  Assess: if the MEWS score is Yellow or Red  Were vital signs accurate and taken at a resting state? Yes  Does the patient meet 2 or more of the SIRS criteria? Yes  Does the patient have a confirmed or suspected source of infection? Yes  MEWS guidelines implemented  No, previously yellow, continue vital signs every 4 hours  Notify: Charge Nurse/RN  Name of Charge Nurse/RN Notified michelle  Assess: SIRS CRITERIA  SIRS Temperature  0  SIRS Pulse 1  SIRS Respirations  0  SIRS WBC 0  SIRS Score Sum  1

## 2022-09-01 DIAGNOSIS — L02415 Cutaneous abscess of right lower limb: Secondary | ICD-10-CM | POA: Diagnosis not present

## 2022-09-01 DIAGNOSIS — L03116 Cellulitis of left lower limb: Secondary | ICD-10-CM | POA: Diagnosis present

## 2022-09-01 DIAGNOSIS — M86159 Other acute osteomyelitis, unspecified femur: Secondary | ICD-10-CM | POA: Diagnosis not present

## 2022-09-01 DIAGNOSIS — N1330 Unspecified hydronephrosis: Secondary | ICD-10-CM | POA: Diagnosis not present

## 2022-09-01 DIAGNOSIS — M6281 Muscle weakness (generalized): Secondary | ICD-10-CM | POA: Diagnosis not present

## 2022-09-01 DIAGNOSIS — L8944 Pressure ulcer of contiguous site of back, buttock and hip, stage 4: Secondary | ICD-10-CM | POA: Diagnosis not present

## 2022-09-01 DIAGNOSIS — R161 Splenomegaly, not elsewhere classified: Secondary | ICD-10-CM | POA: Diagnosis not present

## 2022-09-01 DIAGNOSIS — D72829 Elevated white blood cell count, unspecified: Secondary | ICD-10-CM | POA: Diagnosis not present

## 2022-09-01 DIAGNOSIS — M8668 Other chronic osteomyelitis, other site: Secondary | ICD-10-CM | POA: Diagnosis present

## 2022-09-01 DIAGNOSIS — M8660 Other chronic osteomyelitis, unspecified site: Secondary | ICD-10-CM | POA: Diagnosis not present

## 2022-09-01 DIAGNOSIS — D6859 Other primary thrombophilia: Secondary | ICD-10-CM | POA: Diagnosis not present

## 2022-09-01 DIAGNOSIS — M009 Pyogenic arthritis, unspecified: Secondary | ICD-10-CM | POA: Diagnosis present

## 2022-09-01 DIAGNOSIS — G8220 Paraplegia, unspecified: Secondary | ICD-10-CM | POA: Diagnosis not present

## 2022-09-01 DIAGNOSIS — I82621 Acute embolism and thrombosis of deep veins of right upper extremity: Secondary | ICD-10-CM | POA: Diagnosis not present

## 2022-09-01 DIAGNOSIS — L03115 Cellulitis of right lower limb: Secondary | ICD-10-CM | POA: Diagnosis present

## 2022-09-01 DIAGNOSIS — R591 Generalized enlarged lymph nodes: Secondary | ICD-10-CM | POA: Diagnosis not present

## 2022-09-01 DIAGNOSIS — E785 Hyperlipidemia, unspecified: Secondary | ICD-10-CM | POA: Diagnosis not present

## 2022-09-01 DIAGNOSIS — L02416 Cutaneous abscess of left lower limb: Secondary | ICD-10-CM | POA: Diagnosis not present

## 2022-09-01 DIAGNOSIS — A499 Bacterial infection, unspecified: Secondary | ICD-10-CM | POA: Diagnosis not present

## 2022-09-01 DIAGNOSIS — D721 Eosinophilia, unspecified: Secondary | ICD-10-CM | POA: Diagnosis present

## 2022-09-01 DIAGNOSIS — Z13228 Encounter for screening for other metabolic disorders: Secondary | ICD-10-CM | POA: Diagnosis not present

## 2022-09-01 DIAGNOSIS — Z433 Encounter for attention to colostomy: Secondary | ICD-10-CM | POA: Diagnosis not present

## 2022-09-01 DIAGNOSIS — M86651 Other chronic osteomyelitis, right thigh: Secondary | ICD-10-CM | POA: Diagnosis not present

## 2022-09-01 DIAGNOSIS — Z9359 Other cystostomy status: Secondary | ICD-10-CM | POA: Diagnosis not present

## 2022-09-01 DIAGNOSIS — J45909 Unspecified asthma, uncomplicated: Secondary | ICD-10-CM | POA: Diagnosis not present

## 2022-09-01 DIAGNOSIS — E876 Hypokalemia: Secondary | ICD-10-CM | POA: Diagnosis not present

## 2022-09-01 DIAGNOSIS — A419 Sepsis, unspecified organism: Secondary | ICD-10-CM | POA: Diagnosis not present

## 2022-09-01 DIAGNOSIS — Q059 Spina bifida, unspecified: Secondary | ICD-10-CM | POA: Diagnosis not present

## 2022-09-01 DIAGNOSIS — I82611 Acute embolism and thrombosis of superficial veins of right upper extremity: Secondary | ICD-10-CM | POA: Diagnosis not present

## 2022-09-01 DIAGNOSIS — L89613 Pressure ulcer of right heel, stage 3: Secondary | ICD-10-CM | POA: Diagnosis not present

## 2022-09-01 DIAGNOSIS — Z48815 Encounter for surgical aftercare following surgery on the digestive system: Secondary | ICD-10-CM | POA: Diagnosis not present

## 2022-09-01 DIAGNOSIS — D638 Anemia in other chronic diseases classified elsewhere: Secondary | ICD-10-CM | POA: Diagnosis not present

## 2022-09-01 DIAGNOSIS — B999 Unspecified infectious disease: Secondary | ICD-10-CM | POA: Diagnosis not present

## 2022-09-01 DIAGNOSIS — M25511 Pain in right shoulder: Secondary | ICD-10-CM | POA: Diagnosis not present

## 2022-09-01 DIAGNOSIS — D62 Acute posthemorrhagic anemia: Secondary | ICD-10-CM | POA: Diagnosis not present

## 2022-09-01 DIAGNOSIS — Z933 Colostomy status: Secondary | ICD-10-CM | POA: Diagnosis not present

## 2022-09-01 DIAGNOSIS — Z515 Encounter for palliative care: Secondary | ICD-10-CM | POA: Diagnosis not present

## 2022-09-01 DIAGNOSIS — Z48817 Encounter for surgical aftercare following surgery on the skin and subcutaneous tissue: Secondary | ICD-10-CM | POA: Diagnosis not present

## 2022-09-01 DIAGNOSIS — Z1152 Encounter for screening for COVID-19: Secondary | ICD-10-CM | POA: Diagnosis not present

## 2022-09-01 DIAGNOSIS — I493 Ventricular premature depolarization: Secondary | ICD-10-CM | POA: Diagnosis present

## 2022-09-01 DIAGNOSIS — Z7401 Bed confinement status: Secondary | ICD-10-CM | POA: Diagnosis not present

## 2022-09-01 DIAGNOSIS — I10 Essential (primary) hypertension: Secondary | ICD-10-CM | POA: Diagnosis not present

## 2022-09-01 DIAGNOSIS — D509 Iron deficiency anemia, unspecified: Secondary | ICD-10-CM | POA: Diagnosis not present

## 2022-09-01 DIAGNOSIS — Z452 Encounter for adjustment and management of vascular access device: Secondary | ICD-10-CM | POA: Diagnosis not present

## 2022-09-01 DIAGNOSIS — M869 Osteomyelitis, unspecified: Secondary | ICD-10-CM | POA: Diagnosis not present

## 2022-09-01 DIAGNOSIS — L89154 Pressure ulcer of sacral region, stage 4: Secondary | ICD-10-CM | POA: Diagnosis not present

## 2022-09-01 DIAGNOSIS — F419 Anxiety disorder, unspecified: Secondary | ICD-10-CM | POA: Diagnosis present

## 2022-09-01 DIAGNOSIS — D696 Thrombocytopenia, unspecified: Secondary | ICD-10-CM | POA: Diagnosis present

## 2022-09-01 DIAGNOSIS — L89134 Pressure ulcer of right lower back, stage 4: Secondary | ICD-10-CM | POA: Diagnosis not present

## 2022-09-01 DIAGNOSIS — L89214 Pressure ulcer of right hip, stage 4: Secondary | ICD-10-CM | POA: Diagnosis not present

## 2022-09-01 DIAGNOSIS — I82509 Chronic embolism and thrombosis of unspecified deep veins of unspecified lower extremity: Secondary | ICD-10-CM | POA: Diagnosis not present

## 2022-09-01 DIAGNOSIS — M86652 Other chronic osteomyelitis, left thigh: Secondary | ICD-10-CM | POA: Diagnosis present

## 2022-09-01 LAB — CULTURE, BLOOD (ROUTINE X 2): Special Requests: ADEQUATE

## 2022-09-01 MED ORDER — ASCORBIC ACID 500 MG PO TABS: 500.0000 mg | ORAL_TABLET | Freq: Two times a day (BID) | ORAL | Status: AC

## 2022-09-01 MED ORDER — ENSURE ENLIVE PO LIQD: 237.0000 mL | Freq: Three times a day (TID) | ORAL | Status: DC

## 2022-09-01 MED ORDER — FLUCONAZOLE 200 MG PO TABS: 400.0000 mg | ORAL_TABLET | Freq: Every day | ORAL | Status: DC

## 2022-09-01 MED ORDER — LIDOCAINE 5 % EX PTCH: 1.0000 | MEDICATED_PATCH | CUTANEOUS | Status: DC

## 2022-09-01 MED ORDER — SIMETHICONE 80 MG PO CHEW: 80.0000 mg | CHEWABLE_TABLET | Freq: Four times a day (QID) | ORAL | Status: DC | PRN

## 2022-09-01 MED ORDER — DAPTOMYCIN IV (FOR PTA / DISCHARGE USE ONLY): 600.0000 mg | INTRAVENOUS | Status: DC

## 2022-09-01 MED ORDER — SODIUM CHLORIDE 0.9 % IV SOLN
10.0000 mg/kg | Freq: Every day | INTRAVENOUS | Status: DC
  Administered 2022-09-01: 850 mg via INTRAVENOUS
  Filled 2022-09-01: qty 17

## 2022-09-01 MED ORDER — APIXABAN 5 MG PO TABS: 5.0000 mg | ORAL_TABLET | Freq: Two times a day (BID) | ORAL | Status: DC

## 2022-09-01 MED ORDER — LACTULOSE 10 GM/15ML PO SOLN: 10.0000 g | Freq: Two times a day (BID) | ORAL | Status: DC

## 2022-09-01 MED ORDER — LORATADINE 10 MG PO TABS: 10.0000 mg | ORAL_TABLET | Freq: Every day | ORAL | Status: DC

## 2022-09-01 MED ORDER — HYDROCERIN EX CREA: 1.0000 | TOPICAL_CREAM | Freq: Two times a day (BID) | CUTANEOUS | Status: DC

## 2022-09-01 MED ORDER — PSYLLIUM 95 % PO PACK: 1.0000 | PACK | Freq: Every day | ORAL | Status: DC

## 2022-09-01 MED ORDER — METHOCARBAMOL 1000 MG PO TABS: 1000.0000 mg | ORAL_TABLET | Freq: Three times a day (TID) | ORAL | Status: DC

## 2022-09-01 MED ORDER — ZINC SULFATE 220 (50 ZN) MG PO CAPS: 220.0000 mg | ORAL_CAPSULE | Freq: Every day | ORAL | Status: DC

## 2022-09-01 MED ORDER — DAPTOMYCIN IV (FOR PTA / DISCHARGE USE ONLY): 850.0000 mg | INTRAVENOUS | Status: DC

## 2022-09-01 MED ORDER — GABAPENTIN 100 MG PO CAPS: 100.0000 mg | ORAL_CAPSULE | Freq: Three times a day (TID) | ORAL | Status: DC

## 2022-09-01 MED ORDER — SENNA 8.6 MG PO TABS: 1.0000 | ORAL_TABLET | Freq: Every day | ORAL | Status: DC

## 2022-09-01 MED ORDER — DICLOFENAC SODIUM 1 % EX GEL: 4.0000 g | Freq: Four times a day (QID) | CUTANEOUS | Status: DC | PRN

## 2022-09-01 NOTE — TOC Progression Note (Signed)
Transition of Care [facility]) - Progression Note    Patient Details  Name: Anthony [name] MRN: [id number] Date of Birth: 08-12-1970  Transition of Care [facility]) CM/SW Contact  Eduard Roux, [place] Phone Number: 09/01/2022, 12:18 PM  Clinical Narrative:     Insurance remains pending- clinical information sent to insurance as requested.   [name], MSW, LCSW Clinical Social Worker    Expected Discharge Plan: Skilled Nursing Facility Barriers to Discharge: Continued Medical Work up  Expected Discharge Plan and Services In-house Referral: Clinical Social Work   Post Acute Care Choice: Skilled Nursing Facility Living arrangements for the past 2 months: Single Family Home                                       Social Determinants of Health (SDOH) Interventions SDOH Screenings   Food Insecurity: No Food Insecurity (07/26/2022)  Housing: Low Risk  (07/26/2022)  Transportation Needs: No Transportation Needs (07/26/2022)  Utilities: Not At Risk (07/26/2022)  Alcohol Screen: Low Risk  (01/23/2022)  Depression (PHQ2-9): Low Risk  (02/27/2022)  Financial Resource Strain: Medium Risk (01/23/2022)  Physical Activity: Inactive (01/23/2022)  Social Connections: Socially Isolated (01/23/2022)  Stress: Stress Concern Present (01/23/2022)  Tobacco Use: Medium Risk (08/20/2022)    Readmission Risk Interventions     No data to display

## 2022-09-01 NOTE — TOC Progression Note (Signed)
Transition of Care [facility]) - Progression Note    Patient Details  Name: Anthony [name] MRN: [id number] Date of Birth: 1970-08-04  Transition of Care [facility]) CM/SW Contact  Eduard Roux, [place] Phone Number: 09/01/2022, 2:07 PM  Clinical Narrative:     Received insurance authorization [id number] reference # [id number] 08/12-08/14.   [name], MSW, LCSW Clinical Social Worker    Expected Discharge Plan: Skilled Nursing Facility Barriers to Discharge: Continued Medical Work up  Expected Discharge Plan and Services In-house Referral: Clinical Social Work   Post Acute Care Choice: Skilled Nursing Facility Living arrangements for the past 2 months: Single Family Home                                       Social Determinants of Health (SDOH) Interventions SDOH Screenings   Food Insecurity: No Food Insecurity (07/26/2022)  Housing: Low Risk  (07/26/2022)  Transportation Needs: No Transportation Needs (07/26/2022)  Utilities: Not At Risk (07/26/2022)  Alcohol Screen: Low Risk  (01/23/2022)  Depression (PHQ2-9): Low Risk  (02/27/2022)  Financial Resource Strain: Medium Risk (01/23/2022)  Physical Activity: Inactive (01/23/2022)  Social Connections: Socially Isolated (01/23/2022)  Stress: Stress Concern Present (01/23/2022)  Tobacco Use: Medium Risk (08/20/2022)    Readmission Risk Interventions     No data to display

## 2022-09-01 NOTE — Consult Note (Signed)
WOC Nurse ostomy follow up Stoma type/location: LUQ colostomy Stomal assessment/size: 1 3/4"  Peristomal assessment: intact Treatment options for stomal/peristomal skin: barrier ring  Output thick [place] stool Ostomy pouching: 2pc. 2 3/4" system with barrier ring  Education provided: Patient limited in ability to perform pouch changes.  Had cortisone shot in right shoulder yesterday.  Understands steps to pouch changes . Enrolled patient in [place] Secure Start Discharge program: Yes  supplies at bedside Will follow weekly.  Likely discharge to SNF.  Mike Gip MSN, RN, FNP-BC CWON Wound, Ostomy, Continence Nurse Outpatient [facility] [phone] Pager [phone]

## 2022-09-01 NOTE — Plan of Care (Signed)
  Problem: Education: Goal: Knowledge of General Education information will improve Description: Including pain rating scale, medication(s)/side effects and non-pharmacologic comfort measures Outcome: Progressing   Problem: Activity: Goal: Risk for activity intolerance will decrease Outcome: Progressing   Problem: Nutrition: Goal: Adequate nutrition will be maintained Outcome: Progressing   Problem: Coping: Goal: Level of anxiety will decrease Outcome: Progressing   

## 2022-09-01 NOTE — TOC Transition Note (Signed)
Transition of Care [facility]) - CM/SW Discharge Note   Patient Details  Name: Anthony [name] MRN: [id number] Date of Birth: 06-Mar-1970  Transition of Care [facility]) CM/SW Contact:  Eduard Roux, LCSW Phone Number: 09/01/2022, 3:59 PM   Clinical Narrative:      Patient will Discharge to: [facility] Discharge Date: 09/01/2022 Family Notified: mother  Transport By: Sharin Mons  Per MD patient is ready for discharge. RN, patient, and facility notified of discharge. Discharge Summary sent to facility. RN given number for [phone]. Ambulance transport requested for patient.   Clinical Social Worker signing off.  [name], MSW, LCSW Clinical Social Worker      Barriers to Discharge: Continued Medical Work up   Patient Goals and CMS Choice      Discharge Placement                         Discharge Plan and Services Additional resources added to the After Visit Summary for   In-house Referral: Clinical Social Work   Post Acute Care Choice: Skilled Nursing Facility                               Social Determinants of Health (SDOH) Interventions SDOH Screenings   Food Insecurity: No Food Insecurity (07/26/2022)  Housing: Low Risk  (07/26/2022)  Transportation Needs: No Transportation Needs (07/26/2022)  Utilities: Not At Risk (07/26/2022)  Alcohol Screen: Low Risk  (01/23/2022)  Depression (PHQ2-9): Low Risk  (02/27/2022)  Financial Resource Strain: Medium Risk (01/23/2022)  Physical Activity: Inactive (01/23/2022)  Social Connections: Socially Isolated (01/23/2022)  Stress: Stress Concern Present (01/23/2022)  Tobacco Use: Medium Risk (08/20/2022)     Readmission Risk Interventions     No data to display

## 2022-09-01 NOTE — Progress Notes (Addendum)
Daily Progress Note Intern Pager: [phone]  Patient name: Anthony [name] Medical record number: [id number] Date of birth: Jun 05, 1970 Age: 52 y.o. Gender: male  Primary Care Provider: Erick Alley, DO Consultants: Infectious Disease Code Status: FULL  Pt Overview and Major Events to Date:  7/5: Admitted 7/6: Ortho consulted, stated no acute intervention indicated. ID consulted, recommended BSA bx 7/7: Hg dropped and patient received 1x pRBCs  7/8: Hg dropped again, 1x pRBCs, [name] ordered per ID recs  7/9: Replaced catheter 7/12: Received 1x pRBC 7/15: [name] - LVEF 55-60%, normal function 7/19: Surgery cancelled due to IT issues/outage 7/23: Diverting colostomy 7/25: Switched to PO abx - Ciprofloxacin and Doxycycline x10 days 7/26: Switched Lovenox to Eliquis 7/29: Switched back to IV abx - IV Daptomycin and IV Zosyn 7/30: Switched Zosyn to Meropenem 7/31: Bilateral hip excisional debridement & culture + 1x pRBC 8/2: Colostomy bridge removed at bedside by general surgery 8/4: Started micafungin, blood Cx obtained 8/6: Stopped meropenem 8/8: Stopped micafungin, switched to fluconazole PO 8/11: R shoulder steriod injection 8/12: Revefered, blood Cx drawn   Assessment and Plan: Anthony [name] is a 52 y.o. male with a pertinent PMH of spina bifida with paraplegia and chronic sacral ulcers who presented with infected sacral ulcers and was admitted for septic shock secondary to osteomyelitis, currently s/p diverting colostomy 7/23 and bilateral hip debridement 7/31. Working on dispo/placement.  [facility]     * (Principal) Acute osteomyelitis of pelvic region and thigh (HCC)     Polymycrobial infection, multiple waves of treatment and refevering.   Febrile to 101.9 overnight just before his acetaminophen dose. - Antibiotic regimen includes: Daptomycin 600mg  IV q24 to end on 10/02/22  (8/7-9/12/24) and fluconazole 400 mg PO daily to end on 10/02/22 (8/8-9/12) - Monitor fever  curve - Repeat Blood Cx 8/12 - Daily CBC        Acute Right shoulder pain     ROM and pain improved following shoulder injection, suspect patient is  moving arm more and this is significantly therapeutic.  Xray did show  degenerative changes in the acromioclavicular joint. - PT following, seen daily - Multimodal pain management as above - s/p shoulder injection 8/11        Decubitus ulcer due to spina bifida [facility])     S/p surgical debridement with ortho on 7/31. Surgical cultures with  rare E. Faecium (resistant to ampicillin) and candida. Patient reports  pain is well controlled. - Antibiotics as above - PT on board, daily visits - Wound care on board, wet to dry dressings, addressing foot wounds as  well - Pain regimen: Tylenol 650 mg q6h, Oxycodone 5 mg BID PRN for moderate  pain, Gabapentin 100 TID scheduled, Robaxin 1000 mg q8h scheduled        Suprapubic catheter (HCC)     SPC changed 8/4 by day team, good UOP daily. - Monitor IOs        Anemia of infection and chronic disease     Has received 5U pRBCS during this admission. S/p tranfusion 8/9. Post  H&H 8.2 this AM.  - AM CBC        Acute deep vein thrombosis (DVT) of axillary vein of right upper  extremity (HCC)     DVT noted on R upper extremity 7/12.  No shortness of breath, chest  pain today. - On Eliquis 5mg  BID for 3 months, held post surgery d/t blood loss and  risk of bleeding but restarted on 8/1 - Continue to monitor        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     POD 12 diverting colostomy by Dr. Dwain [name] on 7/23. Bridge removed  8/2, surgery signed off.  Colostomy bag intact and with liquid Anthony [name]  stool. 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  This morning, he reports that his R shoulder is feeling better after his injection yesterday and his ROM is improved now that he has been able to move it more.  Otherwise unchanged.  Objective: Temp:  [97.6 F (36.4 C)-102.1 F (38.9 C)] 98 F (36.7 C) (08/12 1058) Pulse Rate:  [60-124] 92 (08/12 1058) Resp:  [18-21] 21 (08/12 1058) BP: (99-131)/(61-88) 117/68 (08/12 1058) SpO2:  [95 %-98 %] 96 % (08/12 1058) Weight:  [83 kg] 83 kg (08/12 0500)  Physical Exam: General: Deconditioned paraplegic male, resting comfortably in bed, NAD, alert and at baseline. Cardiovascular: Tachycardic rate and regular rhythm. Normal S1/S2. No murmurs, rubs, or gallops appreciated. 2+ radial pulses. Pulmonary: Clear bilaterally to ascultation. No increased WOB, no accessory muscle usage on room air. No wheezes, rales, or crackles. Abdominal: Ostomy with some granulation tissue visible, no surrounding erythema.  Suprapubic catheter with small dried blood, no erythema.  Normoactive bowel sounds, nondistended. No tenderness to deep or light palpation. No rebound or guarding. Skin: Warm and dry. MSK: Improved passive ROM of R shoulder, able to abduct >90 degrees and hold arm above head steadily for >5 seconds. Extremities: No peripheral edema bilaterally.  Laboratory: Most recent CBC Lab Results  Component Value Date   WBC 11.2 (H) 09/01/2022   HGB 8.3 (L) 09/01/2022   HCT 27.8 (L) 09/01/2022   MCV 81.0 09/01/2022   PLT 590 (H) 09/01/2022   Most recent BMP    Latest Ref Rng & Units 08/26/2022    4:05 AM  BMP  Glucose 70 - 99 mg/dL 098   BUN 6 - 20 mg/dL 10   Creatinine 1.19 - 1.24 mg/dL 1.47   Sodium 829 - 562 mmol/L 136   Potassium 3.5 - 5.1 mmol/L 3.5   Chloride 98 - 111 mmol/L 102   CO2 22 - 32 mmol/L 26   Calcium 8.9 - 10.3 mg/dL 8.6     Other pertinent labs: - None  New  Imaging/Diagnostic Tests: - None   , MD 09/01/2022, 12:05 PM [facility]  FMTS Intern pager: [phone], text pages welcome Secure chat group [facility] Teaching Service

## 2022-09-01 NOTE — Discharge Summary (Cosign Needed Addendum)
Family Medicine Teaching [facility] Discharge Summary  Patient name: Anthony [name] Medical record number: [id number] Date of birth: [birth year] Age: 52 y.o. Gender: male Date of Admission: 07/25/2022  Date of Discharge: 09/01/2022 Admitting Physician: Carney Living, MD  Primary Care Provider: Erick Alley, DO Consultants: Infectious Disease  Indication for Hospitalization: Sepsis 2/2 osteomyelitis  Discharge Diagnoses/Problem List:  Principal Problem for Admission: Sepsis 2/2 acute osteomyelitis of pelvic region Other Problems addressed during stay:  Principal Problem:   Acute osteomyelitis of pelvic region and thigh [facility]) Active Problems:   Anemia of infection and chronic disease   Decubitus ulcer due to spina bifida [facility])   Acute Right shoulder pain   Suprapubic catheter (HCC)   Acute deep vein thrombosis (DVT) of axillary vein of right upper extremity (HCC)   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   Pressure injury of contiguous region involving buttock and hip, stage 4 (HCC)   Polymicrobial bacterial infection  Brief Hospital Course:  Anthony [name] is a 52 y.o.male with paraplegia and a history of spina bifida with chronic decubitus ulcers, HTN, who was admitted to the [facility] Medicine Teaching Service at [facility] for sepsis secondary to chronic decubitus ulcers. His hospital course is detailed below:  Septic shock 2/2 chronic decubitus ulcers d/t spina bifida and paraplegia Patient reported feeling ill for 2 weeks with complaints of fever, weakness, chills, decreased appetite, nausea and emesis.  In the ED, was started on Levophed for hypotension.  Was weaned off of this in ED and did not require ICU stay.  Sepsis protocol was initiated.  CT abdomen pelvis was significant for bilateral septic hip joints and chronic osteomyelitis and bilateral pubic rami.  IV antibiotics started as listed below.  Patient continued to refever requiring antipyretics and orthopedic surgery consulted, who  stated there is no surgical intervention at that time.  Urology also consulted as CT showed infection spreading to base of penis, recommended exchanging catheter and continuing patient on BSA with aggressive wound care.  IR consulted for consideration of draining, but feel there is no adequate pocket for drainage.  ID requested repeat [name] for endocarditis reevaluation, patient received [name] on 7/15, and this was negative for vegetation. General surgery was consulted and performed a diverting colostomy on 7/23.  Orthopedic surgery performed surgical debridement of bilateral hips on 7/31. Surgical cultures from hip debridement showed rare WBC, no organisms but rare Enterococcus Faecium (resistant to ampicillin and vancomycin), rare Candida species. ID recommended continuing daptomycin and meropenem and adding micafungin, which was later switched to fluconazole.  Patient refevered on day of discharge (8/12) and blood cultures were drawn again.  At discharge, WBC was 11.2 and patient feeling well.  Anti-infective Course: Blood cultures showed streptococcus dysgalactiae and proteus vulgaris. ID was consulted and involved during hospitalization. Started on IV flagyl (7/5-7/8), PO flagyl (7/8-7/16), cefepime (7/5-7/15), and daptomycin (7/5-7/24)  Transitioned antibiotics to Zosyn (7/16-7/24) and Daptomycin (7/5-7/24) due to suspicion of drug fever from cefepime. Transitioned to PO antibiotics Doxycycline (7/25-7/28) Augmentin (7/25) and Ciprofloxacin (7/26-7/28). IV antibiotics restarted due to continued fevers to Daptomycin 600 mg (7/29-8/12) and Zosyn (7/29). Continued to fever on this regimen so was switched from Zosyn to Meropenem (7/29-8/6) due to hx of proteus and to cover for ESBL. Surgical cultures from hip with Candida species and E faecium resistant to ampicillin and vancomycin ID recommended continuing daptomycin and meropenem and adding Micafungin (8/4-8/8). Switched micafungin to fluconazole PO  (8/8-9/12) after blood Cx grew susceptible species. Refevered 8/12 and  blood cultures were drawn, Daptomycin increased to 850 mg from 600 mg (8/12-9/12).  Anemia During this admission, patient received total 5 units of RBCs, due to transfusion threshold <7.0, as patient's Hgb continually dropped, likely 2/2 to infection. Hemoglobin was 8.3 at discharge.  Right Shoulder Pain Due to weakness, patient reported that he had a fall onto his right shoulder prior to admission.  Right shoulder x-ray showed moderate AC joint osteoarthritis and mild distal lateral subacromial spurring. Continued undergoing PT while inpatient. Received steroid injection on 8/11 with subsequent improvement in pain and ROM.  Patient discharged to SNF.  Pain regimen: Tylenol 650 mg q6h PRN, Oxycodone 5 mg q4h PRN for moderate pain, Gabapentin 100 TID scheduled, Robaxin 1000 mg q8h scheduled.  R venous thromboembolus Right DVT noted via [place] and started patient on lovenox and was switched to Eliquis for 3 months (7/26-10/26).  Suprapubic catheter exchanged Prior to admission the patient had a suprapubic catheter placed.  Catheter was exchanged on 8/4 without difficulty and patient continued to produce urine well daily.  Other chronic conditions were medically managed with home medications and formulary alternatives as necessary:  PVCs (on Flecainide 50 mg BID) HTN (on Lisinopril-hydrochlorothiazide 20-25 mg, Toprol 50 mg - holding BP meds until pressures improve) HLD (on rosuvastatin 40 mg)  PCP Follow-up Recommendations: Follow up with ortho outpatient for tissue grafting in the office and regular wound cleaning. Repeat CBC and CMP at follow up. Blood cultures drawn on day of discharge and pending for review. Will need electric wheelchair orders with PCP. Finishing fluconazole & daptomycin 9/12, PICC line must be pulled at completion.  Disposition: SNF  Discharge Condition: Stable  Discharge Exam:  Vitals:    09/01/22 0709 09/01/22 1058  BP: 119/71 117/68  Pulse: 85 92  Resp: 20 (!) 21  Temp: 97.6 F (36.4 C) 98 F (36.7 C)  SpO2: 98% 96%   Physical Exam: General: Deconditioned paraplegic male, resting comfortably in bed, NAD, alert and at baseline. Cardiovascular: Tachycardic rate and regular rhythm. Normal S1/S2. No murmurs, rubs, or gallops appreciated. 2+ radial pulses. Pulmonary: Clear bilaterally to ascultation. No increased WOB, no accessory muscle usage on room air. No wheezes, rales, or crackles. Abdominal: Ostomy with some granulation tissue visible, no surrounding erythema.  Suprapubic catheter with small dried blood, no erythema.  Normoactive bowel sounds, nondistended. No tenderness to deep or light palpation. No rebound or guarding. Skin: Warm and dry. MSK: Improved passive ROM of R shoulder, able to abduct >90 degrees and hold arm above head steadily for >5 seconds. Extremities: No peripheral edema bilaterally.  Significant Procedures: - Bilateral hip excisional debridement 7/31 - Diverting colostomy 7/23  Significant Labs and Imaging:  Recent Labs  Lab 08/31/22 0500 09/01/22 0619  WBC 10.1 11.2*  HGB 7.7* 8.3*  HCT 26.4* 27.8*  PLT 528* 590*   No results for input(s): "NA", "K", "CL", "CO2", "GLUCOSE", "BUN", "CREATININE", "CALCIUM", "MG", "PHOS", "ALKPHOS", "AST", "ALT", "ALBUMIN", "PROTEIN" in the last 48 hours.  - Initial blood cultures 7/6: Proteus vulgaris and Streptococcus dysgalctiae - Blood cultures 7/31: Enterococcus faecium  Results/Tests Pending at Time of Discharge: Blood cultures 8/12  Discharge Medications:  Allergies as of 09/01/2022       Reactions   Firvanq [vancomycin] Itching        Medication List     TAKE these medications    acetaminophen 500 MG tablet Commonly known as: TYLENOL Take 1,000 mg by mouth daily as needed for moderate pain, headache or fever.  Advil 200 MG tablet Generic drug: ibuprofen Take 200 mg by mouth every  6 (six) hours as needed for headache or moderate pain.   apixaban 5 MG Tabs tablet Commonly known as: ELIQUIS Take 1 tablet (5 mg total) by mouth 2 (two) times daily.   ascorbic acid 500 MG tablet Commonly known as: VITAMIN C Take 1 tablet (500 mg total) by mouth 2 (two) times daily.   daptomycin IVPB Commonly known as: CUBICIN Inject 850 mg into the vein daily. Indication:  Pelvic osteo First Dose: Yes Last Day of Therapy:  10/02/22 Labs - Once weekly:  CBC/D, BMP, and CPK, ESR and CRP Method of administration: IV Push Pull PICC line at the completion of IV antibiotic therapy Method of administration may be changed at the discretion of home infusion pharmacist based upon assessment of the patient and/or caregiver's ability to self-administer the medication ordered.   diclofenac Sodium 1 % Gel Commonly known as: VOLTAREN Apply 4 g topically 4 (four) times daily as needed (Shoulder pain).   feeding supplement Liqd Take 237 mLs by mouth 3 (three) times daily between meals.   FISH OIL PO Take 1 capsule by mouth daily.   flecainide 50 MG tablet Commonly known as: TAMBOCOR Take 1 tablet (50 mg total) by mouth 2 (two) times daily.   fluconazole 200 MG tablet Commonly known as: DIFLUCAN Take 2 tablets (400 mg total) by mouth daily. Start taking on: September 02, 2022   Foley Catheter 2-Way Misc Use as directed by urologist.   gabapentin 100 MG capsule Commonly known as: NEURONTIN Take 1 capsule (100 mg total) by mouth 3 (three) times daily.   hydrocerin Crea Apply 1 Application topically 2 (two) times daily.   lactulose 10 GM/15ML solution Commonly known as: CHRONULAC Take 15 mLs (10 g total) by mouth 2 (two) times daily.   lidocaine 5 % Commonly known as: LIDODERM Place 1-2 patches onto the skin daily. Remove & Discard patch within 12 hours or as directed by MD   lisinopril-hydrochlorothiazide 20-25 MG tablet Commonly known as: ZESTORETIC Take 1 tablet by mouth  daily.   loratadine 10 MG tablet Commonly known as: CLARITIN Take 1 tablet (10 mg total) by mouth daily. Start taking on: September 02, 2022   Methocarbamol 1000 MG Tabs Take 1,000 mg by mouth every 8 (eight) hours.   metoprolol succinate 50 MG 24 hr tablet Commonly known as: TOPROL-XL Take 1 tablet (50 mg total) by mouth 2 (two) times daily. Take with or immediately following a meal.   multivitamin with minerals Tabs tablet Take 1 tablet by mouth daily.   nitroGLYCERIN 0.4 MG SL tablet Commonly known as: NITROSTAT Place 1 tablet (0.4 mg total) under the tongue every 5 (five) minutes as needed for chest pain.   POTASSIUM CHLORIDE PO Take 1 tablet by mouth daily. OTC potassium supplement   psyllium 95 % Pack Commonly known as: HYDROCIL/METAMUCIL Take 1 packet by mouth daily. Start taking on: September 02, 2022   rosuvastatin 40 MG tablet Commonly known as: CRESTOR Take 1 tablet by mouth once daily What changed: when to take this   senna 8.6 MG Tabs tablet Commonly known as: SENOKOT Take 1 tablet (8.6 mg total) by mouth daily. Start taking on: September 02, 2022   simethicone 80 MG chewable tablet Commonly known as: MYLICON Chew 1 tablet (80 mg total) by mouth every 6 (six) hours as needed for flatulence.   zinc sulfate 220 (50 Zn) MG capsule Take 1 capsule (  220 mg total) by mouth daily. Start taking on: September 02, 2022               Durable Medical Equipment  (From admission, onward)           Start     Ordered   07/25/22 1613  For home use only DME Hospital bed  Once       Question Answer Comment  Length of Need Lifetime   Patient has (list medical condition): spina bifida and paraplegia   Bed type Semi-electric   Support Surface: Low Air loss Mattress      07/25/22 1613              Discharge Care Instructions  (From admission, onward)           Start     Ordered   09/01/22 0000  Change dressing on IV access line weekly and PRN  (Home  infusion instructions - [facility] )        09/01/22 1123   09/01/22 0000  Discharge wound care:       Comments: Heels 1.  Clean R heel wound with Vashe wound cleanser Hart [place] [phone]) cut silver hydrofiber Hart [place] [phone]) to fit and apply to wound bed daily, cover with dry gauze and silicone foam.   2.  Wash bilateral feet and in between toes with soap and water.  Dry thoroughly. Apply Eucerin cream to bilateral feet and top of toes twice daily.  DO NOT PLACE BETWEEN TOES.  Hips & Sacrum Cleanse wounds with VASHE Hart [place] [phone]), pack wound (sacral, ischial) with Vashe moist gauze packing, top with ABD pads, secure with tape of mesh underwear if patient prefers. Change TID   09/01/22 1544           Discharge Instructions: Please refer to Patient Instructions section of EMR for full details.  Patient was counseled important signs and symptoms that should prompt return to medical care, changes in medications, dietary instructions, activity restrictions, and follow up appointments.   Follow-Up Appointments: No future appointments.  Shitarev, Dimitry, MD 09/01/2022, 5:58 PM PGY-1, [facility]  I was personally present and performed or re-performed the history, physical exam and medical decision making activities of this service and have verified that the service and findings are accurately documented in the student's note.  Shelby Mattocks, DO                  09/01/2022, 5:58 PM

## 2022-09-01 NOTE — Progress Notes (Signed)
Physical Therapy Treatment Patient Details Name: Anthony [name] MRN: [id number] DOB: [birth year] Today's Date: 09/01/2022   History of Present Illness Pt is a 52 y.o. male who presented 07/25/22 with weakness and a fall. Pt admitted with severe sepsis secondary to sacral decubitus ulcer infection with concern for hip septic arthritis noted on CT. Imaging of R shoulder showed moderate acromioclavicular OA & mild distal lasteral subacromial spurring. S/p colostomy 7/23. Pt now bilateral hip excisional debridement, with excision skin and soft tissue muscle fascia and bone on 7/31. PMH: complete paraplegia, multiple toe amputations, chronic sacral wounds, neurogenic bladder with suprapubic catheter, spina bifida, HTN    PT Comments  Pt received in supine and agreeable to session. Pt reporting improved R shoulder pain and mobility since injection yesterday. Pt continues to demonstrate impaired R shoulder AROM against gravity, but improved tolerance to PROM. Pt demonstrates improved transfer to w/c requiring min A with bed pad. Pt is able to propel his w/c with BUE and minimal R shoulder pain. Pt demonstrated reduced compensation and improved ROM with RUE during propulsion. Pt requests to remain in w/c at end of session for increased mobility, RN notified. Pt remains appropriate for continued inpatient follow up therapy, <3 hours/day to increase functional independence and return to prior level of function of mod I for transfers and w/c mobility.  Pt continues to benefit from PT services to progress toward functional mobility goals.     If plan is discharge home, recommend the following: Two people to help with walking and/or transfers;A lot of help with bathing/dressing/bathroom;Assistance with cooking/housework;Assist for transportation;Help with stairs or ramp for entrance   Can travel by private vehicle        Equipment Recommendations  Hospital bed    Recommendations for Other Services        Precautions / Restrictions Precautions Precautions: Fall;Other (comment) Precaution Comments: paraplegia, colostomy, suprapubic catheter     Mobility  Bed Mobility Overal bed mobility: Needs Assistance Bed Mobility: Supine to Sit     Supine to sit: Contact guard     General bed mobility comments: Pt able to manage BLE and sit to EOB with CGA for safety. Improved ability to push up with RUE    Transfers Overall transfer level: Needs assistance Equipment used: None Transfers: Bed to chair/wheelchair/BSC         Anterior-Posterior transfers: Min assist   General transfer comment: From bed to w/c towards the L requiring min A with bedpad. Pt demonstrating improved ability to use RUE during Copy mobility: Yes Wheelchair propulsion: Both upper extremities Wheelchair parts: Supervision/cueing Distance: 940 Wheelchair Assistance Details (indicate cue type and reason): Pt demonstrating improved ability to propel with BUE with reduced pain and increased R shoulder extension. Pt also demonstrating reduced compensatory R shoulder elevation.      Balance Overall balance assessment: Needs assistance Sitting-balance support: Single extremity supported, Feet unsupported Sitting balance-Leahy Scale: Poor Sitting balance - Comments: Sitting EOB. Pt requires UE support to maintain balance       Standing balance comment: unable at baseline                            Cognition Arousal: Alert Behavior During Therapy: [facility] for tasks assessed/performed Overall Cognitive Status: Within Functional Limits for tasks assessed  Exercises General Exercises - Upper Extremity Shoulder Flexion: PROM, Right, 5 reps Shoulder ABduction: PROM, Right, 5 reps    General Comments        Pertinent Vitals/Pain Pain Assessment Pain Assessment: Faces Faces Pain  Scale: Hurts a little bit Pain Location: R shoulder with mobility Pain Descriptors / Indicators: Discomfort, Grimacing, Guarding Pain Intervention(s): Monitored during session     PT Goals (current goals can now be found in the care plan section) Acute Rehab PT Goals Patient Stated Goal: home PT Goal Formulation: With patient Time For Goal Achievement: 08/27/22 Potential to Achieve Goals: Good Progress towards PT goals: Progressing toward goals    Frequency    Min 1X/week      PT Plan Current plan remains appropriate       AM-PAC PT "6 Clicks" Mobility   Outcome Measure  Help needed turning from your back to your side while in a flat bed without using bedrails?: None Help needed moving from lying on your back to sitting on the side of a flat bed without using bedrails?: A Little Help needed moving to and from a bed to a chair (including a wheelchair)?: A Little Help needed standing up from a chair using your arms (e.g., wheelchair or bedside chair)?: Total Help needed to walk in hospital room?: Total Help needed climbing 3-5 steps with a railing? : Total 6 Click Score: 13    End of Session   Activity Tolerance: Patient tolerated treatment well Patient left: in chair;with call bell/phone within reach (in w/c) Nurse Communication: Mobility status PT Visit Diagnosis: Muscle weakness (generalized) (M62.81);Pain     Time: 6578-4696 PT Time Calculation (min) (ACUTE ONLY): 28 min  Charges:    $Therapeutic Activity: 8-22 mins $Wheel Chair Management: 8-22 mins PT General Charges $$ ACUTE PT VISIT: 1 Visit                     Anthony [name], PTA Acute Rehabilitation Services Secure Chat Preferred  [phone]    Anthony [name] 09/01/2022, 12:22 PM

## 2022-09-01 NOTE — Progress Notes (Signed)
Patient ID: Anthony [name], male   DOB: 1970-06-23, 52 y.o.   MRN: [id number]    Progress Note from the Palliative Medicine Team at [facility]   Patient Name: Anthony [name]        Date: 09/01/2022 DOB: December 01, 1970  Age: 52 y.o. MRN#: [id number] Attending Physician: Nestor Ramp, MD Primary Care Physician: Erick Alley, DO Admit Date: 07/25/2022    Extensive chart review has been completed prior to meeting with patient/family  including labs, vital signs, imaging, progress/consult notes, orders, medications and available advance directive documents.   52 y.o. male who presented 07/25/22 with weakness and a fall. Pt admitted with severe sepsis secondary to sacral decubitus ulcer infection with concern for hip septic arthritis noted on CT. Imaging of R shoulder showed moderate acromioclavicular OA & mild distal lasteral subacromial spurring. S/p colostomy 7/23. Pt now bilateral hip excisional debridement, with excision skin and soft tissue muscle fascia and bone on 7/31. PMH: complete paraplegia, multiple toe amputations, chronic sacral wounds, neurogenic bladder with suprapubic catheter, spina bifida, HTN   Today's date 38 of his hospitalization, he is medically stable for discharge.  This NP assessed patient at the bedside as a follow up palliative medicine needs and emotional support.  Patient is alert and oriented and pleasant.  He verbalizes his plan to discharge to a SNF, but ultimately he hopes to return home.  Education  offered on his serious multiple comorbidities which makes him high risk for decompensation.  Education offered today regarding  the importance of continued conversation with his family and their  medical providers regarding overall plan of care and treatment options,  ensuring decisions are within the context of the patients values and GOCs.  Copy of H POA scanned into EMR  Questions and concerns addressed.  PMT will continue to support holistically      Time: 25  minutes  Detailed review of medical records ( labs, imaging, vital signs), medically appropriate exam ( MS, skin, resp)   discussed with treatment team, counseling and education to patient, family, staff, documenting clinical information, medication management, coordination of care    Lorinda Creed NP  Palliative Medicine Team Team Phone # [phone] Pager [phone]

## 2022-09-01 NOTE — Progress Notes (Signed)
Mobility Specialist Progress Note:   09/01/22 1500  Mobility  Activity Transferred from chair to bed (wheelchair)  Level of Assistance Minimal assist, patient does 75% or more  Assistive Device None  RLE Weight Bearing NWB  LLE Weight Bearing NWB  Activity Response Tolerated well  Mobility Referral Yes  $Mobility charge 1 Mobility  Mobility Specialist Start Time (ACUTE ONLY) 1514  Mobility Specialist Stop Time (ACUTE ONLY) 1524  Mobility Specialist Time Calculation (min) (ACUTE ONLY) 10 min   Post Mobility: 114 HR   Pt received in wheelchair, requesting to go back to bed. MinA during lateral scoot. Pt asymptomatic during session and stated he's feeling stronger in right arm during transition. Pt left in bed with RN present in room.    Anthony [name]  Mobility Specialist Please contact via [employer] office at [phone]

## 2022-09-03 DIAGNOSIS — D62 Acute posthemorrhagic anemia: Secondary | ICD-10-CM | POA: Diagnosis not present

## 2022-09-03 DIAGNOSIS — M869 Osteomyelitis, unspecified: Secondary | ICD-10-CM | POA: Diagnosis not present

## 2022-09-03 DIAGNOSIS — M25511 Pain in right shoulder: Secondary | ICD-10-CM | POA: Diagnosis not present

## 2022-09-03 DIAGNOSIS — Q059 Spina bifida, unspecified: Secondary | ICD-10-CM | POA: Diagnosis not present

## 2022-09-03 DIAGNOSIS — E785 Hyperlipidemia, unspecified: Secondary | ICD-10-CM | POA: Diagnosis not present

## 2022-09-03 DIAGNOSIS — I10 Essential (primary) hypertension: Secondary | ICD-10-CM | POA: Diagnosis not present

## 2022-09-03 DIAGNOSIS — I82509 Chronic embolism and thrombosis of unspecified deep veins of unspecified lower extremity: Secondary | ICD-10-CM | POA: Diagnosis not present

## 2022-09-03 DIAGNOSIS — M6281 Muscle weakness (generalized): Secondary | ICD-10-CM | POA: Diagnosis not present

## 2022-09-03 DIAGNOSIS — E876 Hypokalemia: Secondary | ICD-10-CM | POA: Diagnosis not present

## 2022-09-04 DIAGNOSIS — L89134 Pressure ulcer of right lower back, stage 4: Secondary | ICD-10-CM | POA: Diagnosis not present

## 2022-09-04 DIAGNOSIS — L89214 Pressure ulcer of right hip, stage 4: Secondary | ICD-10-CM | POA: Diagnosis not present

## 2022-09-05 DIAGNOSIS — Q059 Spina bifida, unspecified: Secondary | ICD-10-CM | POA: Diagnosis not present

## 2022-09-05 DIAGNOSIS — E876 Hypokalemia: Secondary | ICD-10-CM | POA: Diagnosis not present

## 2022-09-05 DIAGNOSIS — I10 Essential (primary) hypertension: Secondary | ICD-10-CM | POA: Diagnosis not present

## 2022-09-05 DIAGNOSIS — I82509 Chronic embolism and thrombosis of unspecified deep veins of unspecified lower extremity: Secondary | ICD-10-CM | POA: Diagnosis not present

## 2022-09-05 DIAGNOSIS — M869 Osteomyelitis, unspecified: Secondary | ICD-10-CM | POA: Diagnosis not present

## 2022-09-05 DIAGNOSIS — M25511 Pain in right shoulder: Secondary | ICD-10-CM | POA: Diagnosis not present

## 2022-09-05 DIAGNOSIS — E785 Hyperlipidemia, unspecified: Secondary | ICD-10-CM | POA: Diagnosis not present

## 2022-09-05 DIAGNOSIS — M6281 Muscle weakness (generalized): Secondary | ICD-10-CM | POA: Diagnosis not present

## 2022-09-05 DIAGNOSIS — D62 Acute posthemorrhagic anemia: Secondary | ICD-10-CM | POA: Diagnosis not present

## 2022-09-06 ENCOUNTER — Inpatient Hospital Stay (HOSPITAL_COMMUNITY)
Admission: EM | Admit: 2022-09-06 | Discharge: 2022-09-11 | DRG: 871 | Disposition: A | Discharge status: Skilled Nursing Facility | Payer: Medicare HMO | Source: Skilled Nursing Facility | Attending: Family Medicine | Admitting: Family Medicine

## 2022-09-06 ENCOUNTER — Other Ambulatory Visit: Payer: Self-pay

## 2022-09-06 ENCOUNTER — Emergency Department (HOSPITAL_COMMUNITY): Payer: Medicare HMO

## 2022-09-06 ENCOUNTER — Encounter (HOSPITAL_COMMUNITY): Payer: Self-pay

## 2022-09-06 DIAGNOSIS — D721 Eosinophilia, unspecified: Secondary | ICD-10-CM | POA: Diagnosis present

## 2022-09-06 DIAGNOSIS — Z7401 Bed confinement status: Secondary | ICD-10-CM | POA: Diagnosis not present

## 2022-09-06 DIAGNOSIS — Z452 Encounter for adjustment and management of vascular access device: Secondary | ICD-10-CM | POA: Diagnosis not present

## 2022-09-06 DIAGNOSIS — R509 Fever, unspecified: Secondary | ICD-10-CM | POA: Diagnosis not present

## 2022-09-06 DIAGNOSIS — M86659 Other chronic osteomyelitis, unspecified thigh: Secondary | ICD-10-CM | POA: Diagnosis present

## 2022-09-06 DIAGNOSIS — Z939 Artificial opening status, unspecified: Secondary | ICD-10-CM

## 2022-09-06 DIAGNOSIS — D6859 Other primary thrombophilia: Secondary | ICD-10-CM | POA: Insufficient documentation

## 2022-09-06 DIAGNOSIS — M86652 Other chronic osteomyelitis, left thigh: Secondary | ICD-10-CM | POA: Diagnosis present

## 2022-09-06 DIAGNOSIS — D72829 Elevated white blood cell count, unspecified: Secondary | ICD-10-CM | POA: Diagnosis not present

## 2022-09-06 DIAGNOSIS — M86151 Other acute osteomyelitis, right femur: Secondary | ICD-10-CM | POA: Diagnosis not present

## 2022-09-06 DIAGNOSIS — Z87442 Personal history of urinary calculi: Secondary | ICD-10-CM

## 2022-09-06 DIAGNOSIS — L03115 Cellulitis of right lower limb: Secondary | ICD-10-CM | POA: Diagnosis present

## 2022-09-06 DIAGNOSIS — Z89431 Acquired absence of right foot: Secondary | ICD-10-CM | POA: Diagnosis not present

## 2022-09-06 DIAGNOSIS — N210 Calculus in bladder: Secondary | ICD-10-CM | POA: Diagnosis present

## 2022-09-06 DIAGNOSIS — A419 Sepsis, unspecified organism: Principal | ICD-10-CM | POA: Diagnosis present

## 2022-09-06 DIAGNOSIS — Z8249 Family history of ischemic heart disease and other diseases of the circulatory system: Secondary | ICD-10-CM

## 2022-09-06 DIAGNOSIS — R059 Cough, unspecified: Secondary | ICD-10-CM | POA: Diagnosis not present

## 2022-09-06 DIAGNOSIS — M8660 Other chronic osteomyelitis, unspecified site: Secondary | ICD-10-CM | POA: Diagnosis not present

## 2022-09-06 DIAGNOSIS — R700 Elevated erythrocyte sedimentation rate: Secondary | ICD-10-CM | POA: Diagnosis present

## 2022-09-06 DIAGNOSIS — J45909 Unspecified asthma, uncomplicated: Secondary | ICD-10-CM | POA: Diagnosis not present

## 2022-09-06 DIAGNOSIS — L03116 Cellulitis of left lower limb: Secondary | ICD-10-CM | POA: Diagnosis present

## 2022-09-06 DIAGNOSIS — I82411 Acute embolism and thrombosis of right femoral vein: Secondary | ICD-10-CM | POA: Diagnosis not present

## 2022-09-06 DIAGNOSIS — M7989 Other specified soft tissue disorders: Secondary | ICD-10-CM | POA: Diagnosis not present

## 2022-09-06 DIAGNOSIS — I82611 Acute embolism and thrombosis of superficial veins of right upper extremity: Secondary | ICD-10-CM | POA: Diagnosis not present

## 2022-09-06 DIAGNOSIS — M8668 Other chronic osteomyelitis, other site: Secondary | ICD-10-CM | POA: Diagnosis present

## 2022-09-06 DIAGNOSIS — D696 Thrombocytopenia, unspecified: Secondary | ICD-10-CM | POA: Diagnosis present

## 2022-09-06 DIAGNOSIS — Z993 Dependence on wheelchair: Secondary | ICD-10-CM

## 2022-09-06 DIAGNOSIS — L8944 Pressure ulcer of contiguous site of back, buttock and hip, stage 4: Secondary | ICD-10-CM | POA: Diagnosis present

## 2022-09-06 DIAGNOSIS — Z9359 Other cystostomy status: Secondary | ICD-10-CM | POA: Diagnosis not present

## 2022-09-06 DIAGNOSIS — I493 Ventricular premature depolarization: Secondary | ICD-10-CM | POA: Diagnosis present

## 2022-09-06 DIAGNOSIS — L02415 Cutaneous abscess of right lower limb: Secondary | ICD-10-CM | POA: Diagnosis not present

## 2022-09-06 DIAGNOSIS — E785 Hyperlipidemia, unspecified: Secondary | ICD-10-CM | POA: Diagnosis present

## 2022-09-06 DIAGNOSIS — Z881 Allergy status to other antibiotic agents status: Secondary | ICD-10-CM

## 2022-09-06 DIAGNOSIS — Q059 Spina bifida, unspecified: Secondary | ICD-10-CM

## 2022-09-06 DIAGNOSIS — Z8619 Personal history of other infectious and parasitic diseases: Secondary | ICD-10-CM

## 2022-09-06 DIAGNOSIS — Z86718 Personal history of other venous thrombosis and embolism: Secondary | ICD-10-CM

## 2022-09-06 DIAGNOSIS — G8220 Paraplegia, unspecified: Secondary | ICD-10-CM | POA: Diagnosis present

## 2022-09-06 DIAGNOSIS — M009 Pyogenic arthritis, unspecified: Secondary | ICD-10-CM | POA: Diagnosis present

## 2022-09-06 DIAGNOSIS — L89154 Pressure ulcer of sacral region, stage 4: Secondary | ICD-10-CM | POA: Diagnosis present

## 2022-09-06 DIAGNOSIS — Z1152 Encounter for screening for COVID-19: Secondary | ICD-10-CM

## 2022-09-06 DIAGNOSIS — L8990 Pressure ulcer of unspecified site, unspecified stage: Secondary | ICD-10-CM | POA: Diagnosis not present

## 2022-09-06 DIAGNOSIS — Z9889 Other specified postprocedural states: Secondary | ICD-10-CM

## 2022-09-06 DIAGNOSIS — R161 Splenomegaly, not elsewhere classified: Secondary | ICD-10-CM | POA: Diagnosis not present

## 2022-09-06 DIAGNOSIS — Z789 Other specified health status: Secondary | ICD-10-CM | POA: Diagnosis present

## 2022-09-06 DIAGNOSIS — I82621 Acute embolism and thrombosis of deep veins of right upper extremity: Secondary | ICD-10-CM | POA: Diagnosis present

## 2022-09-06 DIAGNOSIS — M1611 Unilateral primary osteoarthritis, right hip: Secondary | ICD-10-CM | POA: Diagnosis not present

## 2022-09-06 DIAGNOSIS — I10 Essential (primary) hypertension: Secondary | ICD-10-CM | POA: Diagnosis present

## 2022-09-06 DIAGNOSIS — D509 Iron deficiency anemia, unspecified: Secondary | ICD-10-CM | POA: Diagnosis present

## 2022-09-06 DIAGNOSIS — B999 Unspecified infectious disease: Secondary | ICD-10-CM | POA: Diagnosis present

## 2022-09-06 DIAGNOSIS — R591 Generalized enlarged lymph nodes: Secondary | ICD-10-CM | POA: Diagnosis not present

## 2022-09-06 DIAGNOSIS — Z48817 Encounter for surgical aftercare following surgery on the skin and subcutaneous tissue: Secondary | ICD-10-CM | POA: Diagnosis not present

## 2022-09-06 DIAGNOSIS — Z89432 Acquired absence of left foot: Secondary | ICD-10-CM | POA: Diagnosis not present

## 2022-09-06 DIAGNOSIS — Z433 Encounter for attention to colostomy: Secondary | ICD-10-CM | POA: Diagnosis not present

## 2022-09-06 DIAGNOSIS — R7982 Elevated C-reactive protein (CRP): Secondary | ICD-10-CM | POA: Diagnosis present

## 2022-09-06 DIAGNOSIS — Z87891 Personal history of nicotine dependence: Secondary | ICD-10-CM

## 2022-09-06 DIAGNOSIS — L89613 Pressure ulcer of right heel, stage 3: Secondary | ICD-10-CM | POA: Diagnosis present

## 2022-09-06 DIAGNOSIS — D638 Anemia in other chronic diseases classified elsewhere: Secondary | ICD-10-CM | POA: Diagnosis present

## 2022-09-06 DIAGNOSIS — Z792 Long term (current) use of antibiotics: Secondary | ICD-10-CM

## 2022-09-06 DIAGNOSIS — Z79899 Other long term (current) drug therapy: Secondary | ICD-10-CM

## 2022-09-06 DIAGNOSIS — Z933 Colostomy status: Secondary | ICD-10-CM

## 2022-09-06 DIAGNOSIS — L02416 Cutaneous abscess of left lower limb: Secondary | ICD-10-CM | POA: Diagnosis not present

## 2022-09-06 DIAGNOSIS — I82A11 Acute embolism and thrombosis of right axillary vein: Secondary | ICD-10-CM | POA: Diagnosis present

## 2022-09-06 DIAGNOSIS — R6521 Severe sepsis with septic shock: Secondary | ICD-10-CM

## 2022-09-06 DIAGNOSIS — Z7901 Long term (current) use of anticoagulants: Secondary | ICD-10-CM

## 2022-09-06 DIAGNOSIS — Z89422 Acquired absence of other left toe(s): Secondary | ICD-10-CM

## 2022-09-06 DIAGNOSIS — M86651 Other chronic osteomyelitis, right thigh: Secondary | ICD-10-CM | POA: Diagnosis present

## 2022-09-06 DIAGNOSIS — F419 Anxiety disorder, unspecified: Secondary | ICD-10-CM | POA: Diagnosis present

## 2022-09-06 DIAGNOSIS — N1330 Unspecified hydronephrosis: Secondary | ICD-10-CM | POA: Diagnosis not present

## 2022-09-06 LAB — COMPREHENSIVE METABOLIC PANEL
ALT: 55 U/L — ABNORMAL HIGH (ref 0–44)
AST: 37 U/L (ref 15–41)
Albumin: 2.1 g/dL — ABNORMAL LOW (ref 3.5–5.0)
Alkaline Phosphatase: 196 U/L — ABNORMAL HIGH (ref 38–126)
Anion gap: 13 (ref 5–15)
BUN: 28 mg/dL — ABNORMAL HIGH (ref 6–20)
CO2: 22 mmol/L (ref 22–32)
Calcium: 8.9 mg/dL (ref 8.9–10.3)
Chloride: 98 mmol/L (ref 98–111)
Creatinine, Ser: 0.79 mg/dL (ref 0.61–1.24)
GFR, Estimated: >60 mL/min (ref >60)
Glucose, Bld: 112 mg/dL — ABNORMAL HIGH (ref 70–99)
Potassium: 3.7 mmol/L (ref 3.5–5.1)
Sodium: 133 mmol/L — ABNORMAL LOW (ref 135–145)
Total Bilirubin: 0.5 mg/dL (ref 0.3–1.2)
Total Protein: 7.7 g/dL (ref 6.5–8.1)

## 2022-09-06 LAB — CBC WITH DIFFERENTIAL/PLATELET
Abs Immature Granulocytes: 0.12 10*3/uL — ABNORMAL HIGH (ref 0.00–0.07)
Basophils Absolute: 0.2 10*3/uL — ABNORMAL HIGH (ref 0.0–0.1)
Basophils Relative: 2 %
Eosinophils Absolute: 0.6 10*3/uL — ABNORMAL HIGH (ref 0.0–0.5)
Eosinophils Relative: 5 %
HCT: 28.4 % — ABNORMAL LOW (ref 39.0–52.0)
Hemoglobin: 8.5 g/dL — ABNORMAL LOW (ref 13.0–17.0)
Immature Granulocytes: 1 %
Lymphocytes Relative: 14 %
Lymphs Abs: 1.9 10*3/uL (ref 0.7–4.0)
MCH: 23.4 pg — ABNORMAL LOW (ref 26.0–34.0)
MCHC: 29.9 g/dL — ABNORMAL LOW (ref 30.0–36.0)
MCV: 78 fL — ABNORMAL LOW (ref 80.0–100.0)
Monocytes Absolute: 1.9 10*3/uL — ABNORMAL HIGH (ref 0.1–1.0)
Monocytes Relative: 14 %
Neutro Abs: 8.9 10*3/uL — ABNORMAL HIGH (ref 1.7–7.7)
Neutrophils Relative %: 64 %
Platelets: 765 10*3/uL — ABNORMAL HIGH (ref 150–400)
RBC: 3.64 MIL/uL — ABNORMAL LOW (ref 4.22–5.81)
RDW: 21 % — ABNORMAL HIGH (ref 11.5–15.5)
WBC: 13.7 10*3/uL — ABNORMAL HIGH (ref 4.0–10.5)
nRBC: 0 % (ref 0.0–0.2)

## 2022-09-06 LAB — I-STAT CHEM 8, ED
BUN: 25 mg/dL — ABNORMAL HIGH (ref 6–20)
Calcium, Ion: 1.13 mmol/L — ABNORMAL LOW (ref 1.15–1.40)
Chloride: 101 mmol/L (ref 98–111)
Creatinine, Ser: 0.7 mg/dL (ref 0.61–1.24)
Glucose, Bld: 116 mg/dL — ABNORMAL HIGH (ref 70–99)
HCT: 31 % — ABNORMAL LOW (ref 39.0–52.0)
Hemoglobin: 10.5 g/dL — ABNORMAL LOW (ref 13.0–17.0)
Potassium: 3.8 mmol/L (ref 3.5–5.1)
Sodium: 134 mmol/L — ABNORMAL LOW (ref 135–145)
TCO2: 21 mmol/L — ABNORMAL LOW (ref 22–32)

## 2022-09-06 LAB — I-STAT VENOUS BLOOD GAS, ED
Acid-base deficit: 1 mmol/L (ref 0.0–2.0)
Bicarbonate: 22.6 mmol/L (ref 20.0–28.0)
Calcium, Ion: 1.14 mmol/L — ABNORMAL LOW (ref 1.15–1.40)
HCT: 28 % — ABNORMAL LOW (ref 39.0–52.0)
Hemoglobin: 9.5 g/dL — ABNORMAL LOW (ref 13.0–17.0)
O2 Saturation: 97 %
Potassium: 3.9 mmol/L (ref 3.5–5.1)
Sodium: 134 mmol/L — ABNORMAL LOW (ref 135–145)
TCO2: 24 mmol/L (ref 22–32)
pCO2, Ven: 31.7 mmHg — ABNORMAL LOW (ref 44–60)
pH, Ven: 7.462 — ABNORMAL HIGH (ref 7.25–7.43)
pO2, Ven: 87 mmHg — ABNORMAL HIGH (ref 32–45)

## 2022-09-06 LAB — C-REACTIVE PROTEIN: CRP: 20.2 mg/dL — ABNORMAL HIGH (ref <1.0)

## 2022-09-06 LAB — I-STAT CG4 LACTIC ACID, ED: Lactic Acid, Venous: 1.8 mmol/L (ref 0.5–1.9)

## 2022-09-06 LAB — RESP PANEL BY RT-PCR (RSV, FLU A&B, COVID)  RVPGX2
Influenza A by PCR: NEGATIVE
Influenza B by PCR: NEGATIVE
Resp Syncytial Virus by PCR: NEGATIVE
SARS Coronavirus 2 by RT PCR: NEGATIVE

## 2022-09-06 LAB — SEDIMENTATION RATE: Sed Rate: 120 mm/hr — ABNORMAL HIGH (ref 0–16)

## 2022-09-06 MED ORDER — HYDROXYZINE HCL 25 MG PO TABS
25.0000 mg | ORAL_TABLET | Freq: Once | ORAL | Status: AC
  Administered 2022-09-06: 25 mg via ORAL
  Filled 2022-09-06: qty 1

## 2022-09-06 MED ORDER — ACETAMINOPHEN 500 MG PO TABS
1000.0000 mg | ORAL_TABLET | Freq: Once | ORAL | Status: AC
  Administered 2022-09-06: 1000 mg via ORAL
  Filled 2022-09-06: qty 2

## 2022-09-06 MED ORDER — IOHEXOL 350 MG/ML SOLN
75.0000 mL | Freq: Once | INTRAVENOUS | Status: AC | PRN
  Administered 2022-09-06: 75 mL via INTRAVENOUS

## 2022-09-06 MED ORDER — SODIUM CHLORIDE 0.9 % IV SOLN
1.0000 g | Freq: Once | INTRAVENOUS | Status: AC
  Administered 2022-09-06: 1 g via INTRAVENOUS
  Filled 2022-09-06: qty 20

## 2022-09-06 MED ORDER — LINEZOLID 600 MG/300ML IV SOLN
600.0000 mg | Freq: Once | INTRAVENOUS | Status: AC
  Administered 2022-09-06: 600 mg via INTRAVENOUS
  Filled 2022-09-06: qty 300

## 2022-09-06 MED ORDER — PIPERACILLIN-TAZOBACTAM 3.375 G IVPB 30 MIN
3.3750 g | Freq: Once | INTRAVENOUS | Status: DC
  Administered 2022-09-06: 3.375 g via INTRAVENOUS
  Filled 2022-09-06: qty 50

## 2022-09-06 MED ORDER — LACTATED RINGERS IV BOLUS
30.0000 mL/kg | Freq: Once | INTRAVENOUS | Status: AC
  Administered 2022-09-06: 2136 mL via INTRAVENOUS

## 2022-09-06 NOTE — ED Provider Notes (Signed)
[facility] AT [facility] Provider Note  MDM   HPI/ROS:  Anthony [name] is a 52 y.o. male with history of spina bifida and multiple admissions for sepsis secondary to bilateral septic arthritis of the hips and osteomyelitis of bilateral pubic rami presenting from facility after patient started spiking fevers again 2 days ago.  Facility providers also noted that white count jumped from 10-15.  Upon presentation, patient is diaphoretic and chronically ill-appearing.  He endorses that his right hip is bothering him the most right now, and feels that this is likely the source of his infection.  Per chart review, patient was discharged on 8/12 after undergoing prolonged hospitalization for septic shock and multiple antibiotic courses.  ID followed along during hospitalization, guiding antibiotic management according to cultures.  Patient refevers on multiple occasions during hospitalization and was switched to different antibiotic regimens multiple times.  Most recent cultures from septic hip growing Candida species and Enterococcus faecium resistant to ampicillin and vancomycin.  Blood cultures from 7/31 growing Enterococcus faecium.  He was discharged on daptomycin and fluconazole, receiving daptomycin through left upper extremity PICC line.  Antibiotic stop date 9/12.  He also received a total of 5 units of PRBCs during this admission, anemia likely secondary to sepsis.  Patient endorses that his last dose of antibiotics was this morning.  Physical exam is notable for: - Acute and chronically ill-appearing, diaphoretic - Cardiopulmonary exam benign - Right hip with induration, erythema, and active drainage of frank pus with surrounding cellulitis - Left hip wound with packing material, healthy appearing granulation tissue around the edges with no significant erythema or purulence - Sacral wounds with healthy appearing granulation tissue and no obvious signs of infection -  Suprapubic catheter insertion site clean, dry, intact and nonerythematous  Immediately upon presentation, infectious workup initiated.  Blood cultures drawn, wound culture drawn from infected appearing right hip.  Given that patient received a dose of daptomycin and fluconazole this morning, Zosyn administered to cover for gram-negative's and anaerobes.  Atarax administered for anxiety.  Full 30 cc/kg IV fluid resuscitation administered.  Labs resulted with leukocytosis and thrombocytopenia, worsened from prior admission.  Elevated inflammatory markers.  Chest x-ray resulted with no acute cardiopulmonary abnormality.  Given prolonged infectious course, infectious disease paged for recommendations.  Recommending to transition patient to linezolid and meropenem.  Zosyn discontinued, initiated linezolid and meropenem.  Ordered CT chest abdomen pelvis with extension through hips to evaluate for infectious sources.  Resulted with evidence of extensive subcutaneous gas and concern for septic arthritis vs osteomyelitis.    Orthopedics consulted for recommendations.  After conversation with orthopedics, recommending to obtain MRI to further assess for osteomyelitis.  Given lack of specialist orthopedist availability here at [facility], patient would necessitate transfer should MRI result with definitive evidence of osteomyelitis.  Signed out to oncoming team providers at time of signout at approximately midnight.  Plan at time of signout is follow-up MRI, determine disposition.  Disposition:  Handoff  Clinical Impression: No diagnosis found.  Rx / DC Orders ED Discharge Orders     None       The plan for this patient was discussed with Dr. Doran Durand, who voiced agreement and who oversaw evaluation and treatment of this patient.   Clinical Complexity A medically appropriate history, review of systems, and physical exam was performed.  My independent interpretations of EKG, labs, and radiology are  documented in the ED course above.   Click here for ABCD2, HEART and  other calculatorsREFRESH Note before signing   Patient's presentation is most consistent with acute presentation with potential threat to life or bodily function.  Medical Decision Making Amount and/or Complexity of Data Reviewed Labs: ordered. Radiology: ordered.  Risk OTC drugs. Prescription drug management.    HPI/ROS      See MDM section for pertinent HPI and ROS. A complete ROS was performed with pertinent positives/negatives noted above.   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 01/24/2017   Osteomyelitis (HCC)    left hip   Paraplegia (HCC)    secondary to Spina Bifida   Polymicrobial bacteremia with sepsis:    Pressure injury of skin of left buttock 05/13/2021   PVCs (premature ventricular contractions)    S/P debridement 10/30/2021   Septic shock (HCC)    due to osteomyelitis   Slow transit constipation 03/06/2017   Spina bifida    Status post debridement 01/24/2017   Wheelchair bound     Past Surgical History:  Procedure Laterality Date   APPLICATION OF WOUND VAC  09/11/2021   Procedure: APPLICATION OF WOUND VAC;  Surgeon: [name], MD;  Location: [facility] OR;  Service: Orthopedics;;   BACK SURGERY     BUBBLE STUDY  09/09/2021   Procedure: BUBBLE STUDY;  Surgeon: Quintella Reichert, MD;  Location: [facility] ENDOSCOPY;  Service: Cardiovascular;;   HIP SURGERY     I & D EXTREMITY Left 09/11/2021   Procedure: LEFT HIP DEBRIDEMENT AND REMOVAL FEMORAL HEAD;  Surgeon: [name], MD;  Location: [facility] OR;  Service: Orthopedics;  Laterality: Left;   I & D EXTREMITY Left 09/13/2021    Procedure: DEBRIDEMENT LEFT HIP;  Surgeon: [name], MD;  Location: [facility] OR;  Service: Orthopedics;  Laterality: Left;   I & D EXTREMITY Left 09/18/2021   Procedure: DEBRIDEMENT LEFT HIP, WOUND VAC EXCHANGE;  Surgeon: [name], MD;  Location: [facility] OR;  Service: Orthopedics;  Laterality: Left;   I & D EXTREMITY Left 10/30/2021   Procedure: LEFT HIP DEBRIDEMENT WITH KERECIS PLACEMENT;  Surgeon: [name], MD;  Location: [facility] OR;  Service: Orthopedics;  Laterality: Left;   I & D EXTREMITY Bilateral 08/20/2022   Procedure: BILATERAL HIP DEBRIDEMENT;  Surgeon: [name], MD;  Location: [facility] OR;  Service: Orthopedics;  Laterality: Bilateral;   INCISION AND DRAINAGE OF WOUND N/A 01/24/2017   Procedure: IRRIGATION AND DEBRIDEMENT WOUND- BUTTOCK ABSCESS;  Surgeon: Almond Lint, MD;  Location: [facility] ORS;  Service: General;  Laterality: N/A;   LAPAROSCOPIC LOOP COLOSTOMY N/A 08/12/2022   Procedure: LAPAROSCOPIC LOOP SIGMOID COLOSTOMY;  Surgeon: Emelia Loron, MD;  Location: [facility] OR;  Service: General;  Laterality: N/A;   [name] WITHOUT CARDIOVERSION N/A 09/09/2021   Procedure: TRANSESOPHAGEAL ECHOCARDIOGRAM ([name]);  Surgeon: Quintella Reichert, MD;  Location: [facility] ENDOSCOPY;  Service: Cardiovascular;  Laterality: N/A;   [name] WITHOUT CARDIOVERSION N/A 08/04/2022   Procedure: TRANSESOPHAGEAL ECHOCARDIOGRAM;  Surgeon: Chrystie Nose, MD;  Location: [facility] INVASIVE CV LAB;  Service: Cardiovascular;  Laterality: N/A;      Physical Exam   Vitals:   09/06/22 1800 09/06/22 1815 09/06/22  1830 09/06/22 2015  BP: 105/61 (!) 111/54 (!) 114/56 115/64  Pulse: (!) 104 (!) 103 (!) 102 (!) 103  Resp: (!) 29 (!) 28 (!) 33 (!) 31  Temp:      TempSrc:      SpO2: 98% 100% 100% 100%  Weight:      Height:        Physical Exam Vitals and nursing note reviewed.  Constitutional:      Appearance: He is well-developed. He is ill-appearing and diaphoretic.  HENT:     Head: Normocephalic and atraumatic.  Eyes:      Conjunctiva/sclera: Conjunctivae normal.  Cardiovascular:     Rate and Rhythm: Regular rhythm. Tachycardia present.     Heart sounds: No murmur heard. Pulmonary:     Effort: Pulmonary effort is normal. No respiratory distress.     Breath sounds: Normal breath sounds.  Abdominal:     Palpations: Abdomen is soft.     Tenderness: There is no abdominal tenderness.  Musculoskeletal:        General: Swelling and tenderness present.     Cervical back: Neck supple.  Skin:    General: Skin is warm.     Capillary Refill: Capillary refill takes less than 2 seconds.     Findings: Erythema and lesion present.  Neurological:     Mental Status: He is alert. Mental status is at baseline.  Psychiatric:        Mood and Affect: Mood normal.     Starleen Arms, MD Department of Emergency Medicine   Please note that this documentation was produced with the assistance of voice-to-text technology and may contain errors.    Dyanne Iha, MD 09/06/22 2340    Glyn Ade, MD 09/07/22 [phone]

## 2022-09-06 NOTE — ED Triage Notes (Signed)
Per EMS report, pt was treated here last week for sepsis. Over the last 3 days his WBC has gone from 10-15. Pt has 2 sacral stage IV wounds. L lower ostomy and suprapubic catheter. PICC line in L upper arm placed here last week. Pt reports he has a blood clot in his R arm.

## 2022-09-07 ENCOUNTER — Inpatient Hospital Stay (HOSPITAL_COMMUNITY): Payer: Medicare HMO

## 2022-09-07 DIAGNOSIS — Z433 Encounter for attention to colostomy: Secondary | ICD-10-CM | POA: Diagnosis not present

## 2022-09-07 DIAGNOSIS — L89613 Pressure ulcer of right heel, stage 3: Secondary | ICD-10-CM | POA: Diagnosis not present

## 2022-09-07 DIAGNOSIS — L8990 Pressure ulcer of unspecified site, unspecified stage: Secondary | ICD-10-CM | POA: Diagnosis not present

## 2022-09-07 DIAGNOSIS — I10 Essential (primary) hypertension: Secondary | ICD-10-CM | POA: Diagnosis not present

## 2022-09-07 DIAGNOSIS — Z48817 Encounter for surgical aftercare following surgery on the skin and subcutaneous tissue: Secondary | ICD-10-CM | POA: Diagnosis not present

## 2022-09-07 DIAGNOSIS — L89154 Pressure ulcer of sacral region, stage 4: Secondary | ICD-10-CM | POA: Diagnosis not present

## 2022-09-07 DIAGNOSIS — Q059 Spina bifida, unspecified: Secondary | ICD-10-CM | POA: Diagnosis not present

## 2022-09-07 DIAGNOSIS — M86659 Other chronic osteomyelitis, unspecified thigh: Secondary | ICD-10-CM | POA: Diagnosis not present

## 2022-09-07 DIAGNOSIS — L02416 Cutaneous abscess of left lower limb: Secondary | ICD-10-CM | POA: Diagnosis not present

## 2022-09-07 DIAGNOSIS — M8660 Other chronic osteomyelitis, unspecified site: Secondary | ICD-10-CM

## 2022-09-07 DIAGNOSIS — F419 Anxiety disorder, unspecified: Secondary | ICD-10-CM | POA: Diagnosis present

## 2022-09-07 DIAGNOSIS — L8944 Pressure ulcer of contiguous site of back, buttock and hip, stage 4: Secondary | ICD-10-CM | POA: Diagnosis not present

## 2022-09-07 DIAGNOSIS — M7989 Other specified soft tissue disorders: Secondary | ICD-10-CM | POA: Diagnosis not present

## 2022-09-07 DIAGNOSIS — D696 Thrombocytopenia, unspecified: Secondary | ICD-10-CM | POA: Diagnosis not present

## 2022-09-07 DIAGNOSIS — E785 Hyperlipidemia, unspecified: Secondary | ICD-10-CM | POA: Diagnosis present

## 2022-09-07 DIAGNOSIS — M86652 Other chronic osteomyelitis, left thigh: Secondary | ICD-10-CM | POA: Diagnosis present

## 2022-09-07 DIAGNOSIS — L03116 Cellulitis of left lower limb: Secondary | ICD-10-CM | POA: Diagnosis present

## 2022-09-07 DIAGNOSIS — Z1152 Encounter for screening for COVID-19: Secondary | ICD-10-CM | POA: Diagnosis not present

## 2022-09-07 DIAGNOSIS — M86651 Other chronic osteomyelitis, right thigh: Secondary | ICD-10-CM | POA: Diagnosis not present

## 2022-09-07 DIAGNOSIS — B999 Unspecified infectious disease: Secondary | ICD-10-CM | POA: Diagnosis present

## 2022-09-07 DIAGNOSIS — M1611 Unilateral primary osteoarthritis, right hip: Secondary | ICD-10-CM | POA: Diagnosis not present

## 2022-09-07 DIAGNOSIS — M86151 Other acute osteomyelitis, right femur: Secondary | ICD-10-CM | POA: Diagnosis not present

## 2022-09-07 DIAGNOSIS — R059 Cough, unspecified: Secondary | ICD-10-CM | POA: Diagnosis not present

## 2022-09-07 DIAGNOSIS — M8668 Other chronic osteomyelitis, other site: Secondary | ICD-10-CM | POA: Diagnosis present

## 2022-09-07 DIAGNOSIS — J45909 Unspecified asthma, uncomplicated: Secondary | ICD-10-CM | POA: Diagnosis not present

## 2022-09-07 DIAGNOSIS — Z933 Colostomy status: Secondary | ICD-10-CM | POA: Diagnosis not present

## 2022-09-07 DIAGNOSIS — L02415 Cutaneous abscess of right lower limb: Secondary | ICD-10-CM | POA: Diagnosis not present

## 2022-09-07 DIAGNOSIS — I493 Ventricular premature depolarization: Secondary | ICD-10-CM | POA: Diagnosis present

## 2022-09-07 DIAGNOSIS — Z9359 Other cystostomy status: Secondary | ICD-10-CM | POA: Diagnosis not present

## 2022-09-07 DIAGNOSIS — D721 Eosinophilia, unspecified: Secondary | ICD-10-CM | POA: Diagnosis present

## 2022-09-07 DIAGNOSIS — G8220 Paraplegia, unspecified: Secondary | ICD-10-CM | POA: Diagnosis not present

## 2022-09-07 DIAGNOSIS — A419 Sepsis, unspecified organism: Secondary | ICD-10-CM | POA: Diagnosis present

## 2022-09-07 DIAGNOSIS — L03115 Cellulitis of right lower limb: Secondary | ICD-10-CM | POA: Diagnosis present

## 2022-09-07 DIAGNOSIS — Z789 Other specified health status: Secondary | ICD-10-CM | POA: Diagnosis present

## 2022-09-07 DIAGNOSIS — D6859 Other primary thrombophilia: Secondary | ICD-10-CM | POA: Diagnosis not present

## 2022-09-07 DIAGNOSIS — I82411 Acute embolism and thrombosis of right femoral vein: Secondary | ICD-10-CM | POA: Diagnosis not present

## 2022-09-07 DIAGNOSIS — Z7401 Bed confinement status: Secondary | ICD-10-CM | POA: Diagnosis not present

## 2022-09-07 DIAGNOSIS — M009 Pyogenic arthritis, unspecified: Secondary | ICD-10-CM | POA: Diagnosis present

## 2022-09-07 DIAGNOSIS — D638 Anemia in other chronic diseases classified elsewhere: Secondary | ICD-10-CM | POA: Diagnosis not present

## 2022-09-07 DIAGNOSIS — I82611 Acute embolism and thrombosis of superficial veins of right upper extremity: Secondary | ICD-10-CM | POA: Diagnosis not present

## 2022-09-07 DIAGNOSIS — R509 Fever, unspecified: Secondary | ICD-10-CM | POA: Diagnosis not present

## 2022-09-07 DIAGNOSIS — I82621 Acute embolism and thrombosis of deep veins of right upper extremity: Secondary | ICD-10-CM | POA: Diagnosis not present

## 2022-09-07 LAB — CBC
HCT: 27.9 % — ABNORMAL LOW (ref 39.0–52.0)
Hemoglobin: 8.2 g/dL — ABNORMAL LOW (ref 13.0–17.0)
MCH: 23 pg — ABNORMAL LOW (ref 26.0–34.0)
MCHC: 29.4 g/dL — ABNORMAL LOW (ref 30.0–36.0)
MCV: 78.2 fL — ABNORMAL LOW (ref 80.0–100.0)
Platelets: 621 10*3/uL — ABNORMAL HIGH (ref 150–400)
RBC: 3.57 MIL/uL — ABNORMAL LOW (ref 4.22–5.81)
RDW: 20.7 % — ABNORMAL HIGH (ref 11.5–15.5)
WBC: 10.4 10*3/uL (ref 4.0–10.5)
nRBC: 0 % (ref 0.0–0.2)

## 2022-09-07 LAB — BASIC METABOLIC PANEL
Anion gap: 14 (ref 5–15)
BUN: 13 mg/dL (ref 6–20)
CO2: 23 mmol/L (ref 22–32)
Calcium: 9.4 mg/dL (ref 8.9–10.3)
Chloride: 100 mmol/L (ref 98–111)
Creatinine, Ser: 0.53 mg/dL — ABNORMAL LOW (ref 0.61–1.24)
GFR, Estimated: >60 mL/min (ref >60)
Glucose, Bld: 94 mg/dL (ref 70–99)
Potassium: 3.9 mmol/L (ref 3.5–5.1)
Sodium: 137 mmol/L (ref 135–145)

## 2022-09-07 MED ORDER — APIXABAN 5 MG PO TABS
5.0000 mg | ORAL_TABLET | Freq: Two times a day (BID) | ORAL | Status: DC
  Administered 2022-09-07 – 2022-09-11 (×9): 5 mg via ORAL
  Filled 2022-09-07 (×9): qty 1

## 2022-09-07 MED ORDER — CHLORHEXIDINE GLUCONATE CLOTH 2 % EX PADS
6.0000 | MEDICATED_PAD | Freq: Every day | CUTANEOUS | Status: DC
  Administered 2022-09-09 – 2022-09-11 (×3): 6 via TOPICAL

## 2022-09-07 MED ORDER — OMEGA-3-ACID ETHYL ESTERS 1 G PO CAPS
1.0000 g | ORAL_CAPSULE | Freq: Every day | ORAL | Status: DC
  Administered 2022-09-08 – 2022-09-11 (×4): 1 g via ORAL
  Filled 2022-09-07 (×4): qty 1

## 2022-09-07 MED ORDER — ALTEPLASE 2 MG IJ SOLR
2.0000 mg | Freq: Once | INTRAMUSCULAR | Status: AC
  Administered 2022-09-07: 2 mg
  Filled 2022-09-07: qty 2

## 2022-09-07 MED ORDER — LORAZEPAM 2 MG/ML PO CONC
0.5000 mg | Freq: Once | ORAL | Status: DC
Start: 2022-09-07 — End: 2022-09-07

## 2022-09-07 MED ORDER — SENNOSIDES-DOCUSATE SODIUM 8.6-50 MG PO TABS
1.0000 | ORAL_TABLET | Freq: Every day | ORAL | Status: DC
  Administered 2022-09-07 – 2022-09-10 (×4): 1 via ORAL
  Filled 2022-09-07 (×4): qty 1

## 2022-09-07 MED ORDER — FLUCONAZOLE 100 MG PO TABS
400.0000 mg | ORAL_TABLET | Freq: Every day | ORAL | Status: DC
  Administered 2022-09-07 – 2022-09-11 (×5): 400 mg via ORAL
  Filled 2022-09-07 (×2): qty 4
  Filled 2022-09-07 (×2): qty 2
  Filled 2022-09-07: qty 4

## 2022-09-07 MED ORDER — LORATADINE 10 MG PO TABS
10.0000 mg | ORAL_TABLET | Freq: Every day | ORAL | Status: DC
  Administered 2022-09-07 – 2022-09-11 (×5): 10 mg via ORAL
  Filled 2022-09-07 (×5): qty 1

## 2022-09-07 MED ORDER — METHOCARBAMOL 500 MG PO TABS
1000.0000 mg | ORAL_TABLET | Freq: Three times a day (TID) | ORAL | Status: DC
  Administered 2022-09-07 – 2022-09-11 (×13): 1000 mg via ORAL
  Filled 2022-09-07 (×13): qty 2

## 2022-09-07 MED ORDER — SODIUM CHLORIDE 0.9% FLUSH: 10.0000 mL | INTRAVENOUS | Status: DC | PRN

## 2022-09-07 MED ORDER — FLECAINIDE ACETATE 50 MG PO TABS
50.0000 mg | ORAL_TABLET | Freq: Two times a day (BID) | ORAL | Status: DC
  Administered 2022-09-07 – 2022-09-11 (×9): 50 mg via ORAL
  Filled 2022-09-07 (×10): qty 1

## 2022-09-07 MED ORDER — GADOBUTROL 1 MMOL/ML IV SOLN
7.0000 mL | Freq: Once | INTRAVENOUS | Status: AC | PRN
  Administered 2022-09-07: 7 mL via INTRAVENOUS

## 2022-09-07 MED ORDER — LORAZEPAM 2 MG/ML IJ SOLN
INTRAMUSCULAR | Status: AC
  Administered 2022-09-07: 0.5 mg
  Filled 2022-09-07: qty 1

## 2022-09-07 MED ORDER — LORAZEPAM 1 MG PO TABS: 0.5000 mg | ORAL_TABLET | Freq: Once | ORAL | Status: DC

## 2022-09-07 MED ORDER — LINEZOLID 600 MG/300ML IV SOLN
600.0000 mg | Freq: Two times a day (BID) | INTRAVENOUS | Status: DC
  Administered 2022-09-07 – 2022-09-09 (×5): 600 mg via INTRAVENOUS
  Filled 2022-09-07 (×7): qty 300

## 2022-09-07 MED ORDER — SODIUM CHLORIDE 0.9 % IV SOLN
1.0000 g | Freq: Three times a day (TID) | INTRAVENOUS | Status: DC
  Administered 2022-09-07 – 2022-09-09 (×6): 1 g via INTRAVENOUS
  Filled 2022-09-07 (×7): qty 20

## 2022-09-07 MED ORDER — ENSURE ENLIVE PO LIQD
237.0000 mL | Freq: Three times a day (TID) | ORAL | Status: DC
  Administered 2022-09-07 – 2022-09-11 (×12): 237 mL via ORAL
  Filled 2022-09-07 (×2): qty 237

## 2022-09-07 MED ORDER — HYDROXYZINE HCL 25 MG PO TABS
25.0000 mg | ORAL_TABLET | Freq: Three times a day (TID) | ORAL | Status: DC | PRN
  Administered 2022-09-08 – 2022-09-10 (×4): 25 mg via ORAL
  Filled 2022-09-07 (×4): qty 1

## 2022-09-07 MED ORDER — PSYLLIUM 95 % PO PACK
1.0000 | PACK | Freq: Every day | ORAL | Status: DC
  Administered 2022-09-08 – 2022-09-11 (×4): 1 via ORAL
  Filled 2022-09-07 (×4): qty 1

## 2022-09-07 MED ORDER — ROSUVASTATIN CALCIUM 20 MG PO TABS
40.0000 mg | ORAL_TABLET | Freq: Every day | ORAL | Status: DC
  Administered 2022-09-07 – 2022-09-08 (×2): 40 mg via ORAL
  Filled 2022-09-07 (×2): qty 2

## 2022-09-07 MED ORDER — LORAZEPAM 2 MG/ML IJ SOLN
0.5000 mg | Freq: Once | INTRAMUSCULAR | Status: DC
  Filled 2022-09-07: qty 1

## 2022-09-07 MED ORDER — ADULT MULTIVITAMIN W/MINERALS CH
1.0000 | ORAL_TABLET | Freq: Every day | ORAL | Status: DC
  Administered 2022-09-07 – 2022-09-11 (×5): 1 via ORAL
  Filled 2022-09-07 (×5): qty 1

## 2022-09-07 MED ORDER — GABAPENTIN 100 MG PO CAPS
100.0000 mg | ORAL_CAPSULE | Freq: Three times a day (TID) | ORAL | Status: DC
  Administered 2022-09-07 – 2022-09-11 (×13): 100 mg via ORAL
  Filled 2022-09-07 (×13): qty 1

## 2022-09-07 MED ORDER — LACTULOSE 10 GM/15ML PO SOLN
10.0000 g | Freq: Two times a day (BID) | ORAL | Status: DC
  Administered 2022-09-07 – 2022-09-11 (×8): 10 g via ORAL
  Filled 2022-09-07 (×8): qty 15

## 2022-09-07 MED ORDER — VITAMIN C 500 MG PO TABS
500.0000 mg | ORAL_TABLET | Freq: Two times a day (BID) | ORAL | Status: DC
  Administered 2022-09-07 – 2022-09-11 (×8): 500 mg via ORAL
  Filled 2022-09-07 (×8): qty 1

## 2022-09-07 MED ORDER — NITROGLYCERIN 0.4 MG SL SUBL: 0.4000 mg | SUBLINGUAL_TABLET | SUBLINGUAL | Status: DC | PRN

## 2022-09-07 MED ORDER — ZINC SULFATE 220 (50 ZN) MG PO CAPS
220.0000 mg | ORAL_CAPSULE | Freq: Every day | ORAL | Status: DC
  Administered 2022-09-07 – 2022-09-11 (×5): 220 mg via ORAL
  Filled 2022-09-07 (×5): qty 1

## 2022-09-07 MED ORDER — METOPROLOL SUCCINATE ER 25 MG PO TB24
50.0000 mg | ORAL_TABLET | Freq: Two times a day (BID) | ORAL | Status: DC
  Administered 2022-09-07 – 2022-09-11 (×9): 50 mg via ORAL
  Filled 2022-09-07 (×9): qty 2

## 2022-09-07 MED ORDER — LORAZEPAM 2 MG/ML PO CONC: 0.5000 mg | Freq: Once | ORAL | Status: DC | PRN

## 2022-09-07 NOTE — ED Provider Notes (Signed)
Patient's care resumed at shift change form [employer]. For full HPI, please refer to previous provider's note. Plan is to reassess after UA and MRI. CT chest abdomen pelvis with extension through hips ordered by previous team to evaluate for infectious sources.  Resulted with evidence of extensive subcutaneous gas and concern for septic arthritis vs osteomyelitis.     Orthopedics consulted by previous team for recommendations.  After conversation with orthopedics, they recommended obtaining MRI to further assess for osteomyelitis.  Given lack of specialist orthopedist availability here at [facility], patient would necessitate transfer should MRI result with definitive evidence of osteomyelitis.  Physical Exam  BP 122/64   Pulse 93   Temp 98.8 F (37.1 C)   Resp 19   Ht 5\' 8"  (1.727 m)   Wt 71.2 kg   SpO2 100%   BMI 23.87 kg/m   Physical Exam  Procedures  Procedures  ED Course / MDM   Clinical Course as of 09/07/22 0644  Sat Sep 06, 2022  1728 Watcher 45 YOM (Resident)  Spina Biffida at facility. Has fevers. ID follows. Recent sepsis. Wounds from his Osteo-hip On ABX via PICC.  Code Sepsis, consult with ortho/ID.  [CC]    Clinical Course User Index [CC] Glyn Ade, MD   Medical Decision Making Amount and/or Complexity of Data Reviewed Labs: ordered. Radiology: ordered.  Risk OTC drugs. Prescription drug management.   ***

## 2022-09-07 NOTE — Consult Note (Signed)
[facility]    Date of Admission:  09/06/2022     Reason for Consult: sepsis    Referring Provider: McDiarmid     Lines:  Lue picc  Abx: 8/17-c meropenem 8/17-c linezolid 8/17-c fluconazole   Outpatient dapto/fluc        Assessment: 52 yo male with spinabifida and complex chronic sacral ulcer associated bone/joint and soft tissue infection, urinary stone with chronic suprapubic catheter,  recent prolonged hospital admission as described below, discharged on planned 6 weeks of daptomycin/fluconazole aiming toward sacral ulcer infection, readmitted promptly for ongoing fever       He doesn't report increased purulent discharge from wound and exam finding today showed rather clean large wound without purulence or foul odor and ct scan without obvious abscess  Exam without cellulitis on his heels either where he had ulcer previously   Ddx: -line infection from LUE picc -?uti -- can't tell symptomatically -progression of sacral associated process due to chronic OM burden vs lacking gram negative coverage -drug fever -- eosinophilia is slightly rising    Some consideration: History of proteus vulgaris which is induceable amp-c organism needing either cefepime/meropenem Recent bsAbx exposure and concerned for mdro organism development  Daptomycin could cause eosinophilic pna. But cxr clear and no chest sx; mild eosinophilia s/o other drug related reaction   Final note: He has long hospital admission recently and despite dr Audrie Lia extremely gracious/heroic attempt to debride him as much as he could, and bsAbx, he continues to fever up to the point of discharge. And at that time there was no presence of organisms not falling under the abx he received on discharge  So if this is all related to necrotic bone burden from his sacral ulcer (whether it be necrotic chronic burden or MDRO), it seems rather concerning how he is failing  treatment. At the least if all other source r/o'ed would recommend targetted therapy with tissue culture   Plan: Broaden abx to include meropenem, change dapto to linezolid, continue fluconazole Consult wound care and please discuss with ortho -- if no I&D indicated then any options to do hydrotherapy? F/u blood cx Trend cbc with diff and getting lft for the next 3 days Consider changing out [facility] and send fresh urine sample for culture -- unable to tell if uti or not, but adding gram negative coverage as mentioned above If negative bcx and still fever, then consider  A) remove picc  B) if A fails, and no option to manage sacral ulcer here in terms of debridement, consider transferring to tertiary care center, or discuss hospice 7.   If step 5 works, I would focus abx only 10 days and go with all oral such as linezolid, cipro, and fluconazole. 8.   There are plenty of evidence to suggest longer abx does no better in his case 9.   Dr Daiva Eves to take over tomorrow 10.   Discussed with team     ------------------------------------------------ Principal Problem:   Sepsis (HCC) Active Problems:   Advanced Directives in ACP section    HPI: Anthony [name] is a 52 y.o. male with spinabifida and complex chronic sacral ulcer associated bone/joint and soft tissue infection, urinary stone with chronic suprapubic catheter,  recent prolonged hospital admission as described below, discharged on planned 6 weeks of daptomycin/fluconazole aiming toward sacral ulcer infection, readmitted promptly for ongoing fever  He was discharged 8/12 (admitted 7/05) for sepsis related to  blood stream infection in setting chronic bone/joint/soft tissue infection of his sacral ulcer. Relevant id detail: 7/5 admission bsi proteus vulgaris and high burden group b strep -- negative tee for endocarditis, and finished 2 week appropriate abx for that Ongoing fever despite bsAbx including dapto, cefepime, flagyl and repeat bcx  being negative. Ultimately had colostomy 7/23 to reduce stool burden; ongoing fever and underwent I&D of sacral ulcer 7/31. Tissue cx 7/31 grew e faecium, e faecalis, and candida orthopsilosis. This cx done in setting ongoing bs Abx including gram negative coverage Discharged on planned 6 weeks dapto/fluconazole   Patient noted on chart review to have fever still up to the point of discharge  He returned now from snf for malaise, fever, and increased purulent discharge from sacral process  He denies increased wound discharge or purulence  If he has uti he can't feel; he does have chronic spc   No increased colostomy output  No cough, dyspnea, rash     Tmax 100.1 on presentation Wbc 14 - slight increased eosinophilia Lft mild elevation Cr baseline Ct abd pelv with contrast as below Ortho consulted and want to get mri of sacral process Bcx in process   No other sx including rash/pruritus, headache or pain    Family History  Problem Relation Age of Onset   CAD Father        died age 56 from MI   Sudden Cardiac Death Sister        age 43   CAD Maternal Grandfather        died age 29 MI    Social History   Tobacco Use   Smoking status: Former    Types: Cigarettes   Smokeless tobacco: Never  Vaping Use   Vaping status: Never Used  Substance Use Topics   Alcohol use: No    Allergies  Allergen Reactions   Firvanq [Vancomycin] Itching    Review of Systems: ROS All Other ROS was negative, except mentioned above   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 01/24/2017   Osteomyelitis (HCC)    left hip   Paraplegia (HCC)    secondary to Spina  Bifida   Polymicrobial bacteremia with sepsis:    Pressure injury of skin of left buttock 05/13/2021   PVCs (premature ventricular contractions)    S/P debridement 10/30/2021   Septic shock (HCC)    due to osteomyelitis   Slow transit constipation 03/06/2017   Spina bifida    Status post debridement 01/24/2017   Wheelchair bound        Scheduled Meds:  apixaban  5 mg Oral BID   ascorbic acid  500 mg Oral BID   feeding supplement  237 mL Oral TID BM   flecainide  50 mg Oral BID   fluconazole  400 mg Oral Daily   gabapentin  100 mg Oral TID   lactulose  10 g Oral BID   loratadine  10 mg Oral Daily   LORazepam  0.5 mg Oral Once   Methocarbamol  1,000 mg Oral Q8H   metoprolol succinate  50 mg Oral BID   multivitamin  with minerals  1 tablet Oral Daily   omega-3 acid ethyl esters  1 g Oral Daily   rosuvastatin  40 mg Oral Daily   zinc sulfate  220 mg Oral Daily   Continuous Infusions:  linezolid (ZYVOX) IV     meropenem (MERREM) IV     PRN Meds:.nitroGLYCERIN   OBJECTIVE: Blood pressure 109/64, pulse 93, temperature 99.7 F (37.6 C), temperature source Oral, resp. rate (!) 22, height 5\' 8"  (1.727 m), weight 71.2 kg, SpO2 99%.  Physical Exam  General/constitutional: no distress, pleasant HEENT: Normocephalic, PER, Conj Clear, EOMI, Oropharynx clear Neck supple CV: rrr no mrg Lungs: clear to auscultation, normal respiratory effort Abd: Soft, Nontender Ext: no edema Skin: sacral ulcer clean base and no purulence; no sign of cellulitis bilateral LE heels Neuro: paraplegic MSK: shrunken atrophic flaccid bilateral LE    Central line presence: left upper ext picc site no erythema/purulence GU: spc site no purulence or sign of cellulitis   Lab Results Lab Results  Component Value Date   WBC 10.4 09/07/2022   HGB 8.2 (L) 09/07/2022   HCT 27.9 (L) 09/07/2022   MCV 78.2 (L) 09/07/2022   PLT 621 (H) 09/07/2022    Lab Results  Component Value Date   CREATININE  0.53 (L) 09/07/2022   BUN 13 09/07/2022   NA 137 09/07/2022   K 3.9 09/07/2022   CL 100 09/07/2022   CO2 23 09/07/2022    Lab Results  Component Value Date   ALT 55 (H) 09/06/2022   AST 37 09/06/2022   ALKPHOS 196 (H) 09/06/2022   BILITOT 0.5 09/06/2022      Microbiology: Recent Results (from the past 240 hour(s))  Culture, blood (Routine X 2) w Reflex to ID Panel     Status: None   Collection Time: 09/01/22 12:10 PM   Specimen: BLOOD  Result Value Ref Range Status   Specimen Description BLOOD right foot  Final   Special Requests   Final    BOTTLES DRAWN AEROBIC AND ANAEROBIC Blood Culture adequate volume   Culture   Final    NO GROWTH 5 DAYS Performed at [facility], [address] [place], [place] [address]    Report Status 09/06/2022 FINAL  Final  Culture, blood (Routine X 2) w Reflex to ID Panel     Status: None   Collection Time: 09/01/22 12:11 PM   Specimen: BLOOD  Result Value Ref Range Status   Specimen Description BLOOD LEFT FOOT  Final   Special Requests   Final    BOTTLES DRAWN AEROBIC AND ANAEROBIC Blood Culture adequate volume   Culture   Final    NO GROWTH 5 DAYS Performed at [facility], [address], [place], [place] [address]    Report Status 09/06/2022 FINAL  Final  Blood culture (routine x 2)     Status: None (Preliminary result)   Collection Time: 09/06/22  5:10 PM   Specimen: BLOOD  Result Value Ref Range Status   Specimen Description BLOOD BLOOD LEFT FOREARM  Final   Special Requests   Final    BOTTLES DRAWN AEROBIC AND ANAEROBIC Blood Culture results may not be optimal due to an inadequate volume of blood received in culture bottles   Culture   Final    NO GROWTH < 12 HOURS Performed at [facility], [address], [place], [place] [address]    Report Status PENDING  Incomplete  Resp panel by RT-PCR (RSV,  Flu A&B, Covid) Anterior Nasal Swab     Status: None   Collection Time: 09/06/22  6:25 PM   Specimen: Anterior  Nasal Swab  Result Value Ref Range Status   SARS Coronavirus 2 by RT PCR NEGATIVE NEGATIVE Final   Influenza A by PCR NEGATIVE NEGATIVE Final   Influenza B by PCR NEGATIVE NEGATIVE Final    Comment: (NOTE) The Xpert Xpress SARS-CoV-2/FLU/RSV plus assay is intended as an aid in the diagnosis of influenza from Nasopharyngeal swab specimens and should not be used as a sole basis for treatment. Nasal washings and aspirates are unacceptable for Xpert Xpress SARS-CoV-2/FLU/RSV testing.  Fact Sheet for Patients: BloggerCourse.com  Fact Sheet for Healthcare Providers: SeriousBroker.it  This test is not yet approved or cleared by the [place] FDA and has been authorized for detection and/or diagnosis of SARS-CoV-2 by FDA under an Emergency Use Authorization (EUA). This EUA will remain in effect (meaning this test can be used) for the duration of the COVID-19 declaration under Section 564(b)(1) of the Act, 21 U.S.C. section 360bbb-3(b)(1), unless the authorization is terminated or revoked.     Resp Syncytial Virus by PCR NEGATIVE NEGATIVE Final    Comment: (NOTE) Fact Sheet for Patients: BloggerCourse.com  Fact Sheet for Healthcare Providers: SeriousBroker.it  This test is not yet approved or cleared by the [place] FDA and has been authorized for detection and/or diagnosis of SARS-CoV-2 by FDA under an Emergency Use Authorization (EUA). This EUA will remain in effect (meaning this test can be used) for the duration of the COVID-19 declaration under Section 564(b)(1) of the Act, 21 U.S.C. section 360bbb-3(b)(1), unless the authorization is terminated or revoked.  Performed at [facility], [address] [place], [place] [address]   Blood culture (routine x 2)     Status: None (Preliminary result)   Collection Time: 09/06/22  6:50 PM   Specimen: BLOOD LEFT HAND   Result Value Ref Range Status   Specimen Description BLOOD LEFT HAND  Final   Special Requests   Final    BOTTLES DRAWN AEROBIC ONLY Blood Culture adequate volume   Culture   Final    NO GROWTH < 12 HOURS Performed at [facility], [address], [place], [place] [address]    Report Status PENDING  Incomplete  Aerobic/Anaerobic Culture w Gram Stain (surgical/deep wound)     Status: None (Preliminary result)   Collection Time: 09/06/22 10:04 PM   Specimen: Wound; Abscess  Result Value Ref Range Status   Specimen Description WOUND SITE NOT SPECIFIED  Final   Special Requests NONE  Final   Gram Stain   Final    FEW WBC PRESENT, PREDOMINANTLY MONONUCLEAR FEW GRAM POSITIVE COCCI IN PAIRS FEW GRAM NEGATIVE RODS    Culture   Final    TOO YOUNG TO READ Performed at [facility], [address] [place], [place] [address]    Report Status PENDING  Incomplete     Serology:    Imaging: If present, new imagings (plain films, ct scans, and mri) have been personally visualized and interpreted; radiology reports have been reviewed. Decision making incorporated into the Impression / Recommendations.  8/17 ct abd pelv with contrast 1. Bladder wall thickening with multiple bladder calculi and suprapubic catheter in place, suggesting cystitis. 2. Mild hydronephrosis on the right with no obstructing stone. There is urothelial enhancement with periureteral fat stranding on the right, compatible with ureteritis. 3. Bilateral decubitus ulceration involving the  hips and ischial regions bilaterally with extensive gas in the soft tissues, greater on the right than on the left. Progressive erosions are noted at the ischium on the left, possible acute on chronic osteomyelitis or septic arthritis. 4. Extensive retroperitoneal, bilateral iliac chain, and inguinal lymphadenopathy, likely reactive. 5. Mild splenomegaly. 6. Left-sided PICC line terminates in the right  brachiocephalic vein. Repositioning is suggested.    8/17 cxr No acute pathology   Raymondo Band, MD [facility] [phone] pager    09/07/2022, 11:09 AM

## 2022-09-07 NOTE — ED Provider Notes (Signed)
Received at shift change from Dyanne Iha MD please see note for full detail  Patient with medical history including paraplegia, Spina Bifida, Chronic decubitus ulcers, hypertension, presenting from nursing facility due to fevers and chills.  Patient was just discharged from the hospital on the 12th due to sepsis secondary due to chronic decubitus ulcers, CT scans were obtained showing bilateral septic hip joints and chronic osteomyelitis/bilateral pubic rami, underwent debridement by orthopedic surgery, consulted by ID PICC line was not placed discharged home on daptomycin fluconazole.  On arrival patient was tachycardic, febrile, with an elevated white count, sepsis was activated start antibiotics.  Per previous provider who spoke with orthopedic who recommends MRI for further assessment of acute osteomyelitis, if osteomyelitis is present patient need to be transferred to [facility] as he do not have the specialist to manage this.  If negative patient can be admitted for worsening decubitus ulceration.   Physical Exam  BP 109/64   Pulse 93   Temp 98.8 F (37.1 C)   Resp (!) 22   Ht 5\' 8"  (1.727 m)   Wt 71.2 kg   SpO2 99%   BMI 23.87 kg/m   Physical Exam Vitals and nursing note reviewed.  Constitutional:      General: He is not in acute distress.    Appearance: Normal appearance. He is not ill-appearing or diaphoretic.  HENT:     Head: Normocephalic and atraumatic.     Nose: No congestion or rhinorrhea.  Eyes:     General:        Right eye: No discharge.        Left eye: No discharge.     Conjunctiva/sclera: Conjunctivae normal.  Cardiovascular:     Rate and Rhythm: Normal rate.  Pulmonary:     Effort: Pulmonary effort is normal.  Musculoskeletal:     Cervical back: Neck supple.  Skin:    General: Skin is warm and dry.  Neurological:     Mental Status: He is alert.  Psychiatric:        Mood and Affect: Mood normal.     Procedures  Procedures  ED Course / MDM    Clinical Course as of 09/07/22 0649  Sat Sep 06, 2022  1728 Watcher 26 YOM (Resident)  Spina Biffida at facility. Has fevers. ID follows. Recent sepsis. Wounds from his Osteo-hip On ABX via PICC.  Code Sepsis, consult with ortho/ID.  [CC]    Clinical Course User Index [CC] Glyn Ade, MD   Medical Decision Making Amount and/or Complexity of Data Reviewed Labs: ordered. Radiology: ordered.  Risk OTC drugs. Prescription drug management.    Lab Tests:  I Ordered, and personally interpreted labs.  The pertinent results include: CBC shows leukocytosis of 13.7, microcytic anemia hemoglobin 8.5 at baseline, CMP reveals sodium 133, glucose 112, BUN 28, ALT 55, alk phos 196, C-reactive elevated 20, sed rate elevated at 120, VBG unremarkable, lactic sent to unremarkable blood cultures pending respiratory panel negative   Imaging Studies ordered:  I ordered imaging studies including CT chest abdomen pelvis with contrast, chest x-ray, MRI of hip I independently visualized and interpreted imaging which showed chest x-ray unremarkable, CT scan reveals bladder wall thickening, urethritis, bilateral decubitus ulceration, extensive gas in the soft tissue, greater on the right versus left, possible acute on chronic osteomyelitis or septic arthritis I agree with the radiologist interpretation   Cardiac Monitoring:  The patient was maintained on a cardiac monitor.  I personally viewed and interpreted the cardiac monitored  which showed an underlying rhythm of: Sinus tach without signs of ischemia   Medicines ordered and prescription drug management:  I ordered medication including N/A I have reviewed the patients home medicines and have made adjustments as needed  Critical Interventions:  N/A   Reevaluation:  Resting comfortably having no complaints  Consultations Obtained:  N/A    Test Considered:  N/A    Dispostion and problem list  Due to shift change  patient off to [facility]  Follow-up on MRI, if shows acute osteomyelitis patient will need to be transferred to [facility] as we do not have specialist available to manage.  If unremarkable patient patient can remain at [facility] and will need hospitalization for continued IV management for worsening right-sided decubitus ulcer.  ID was already consulted and was placed on linezolid and meropenem.            Carroll Sage, PA-C 09/07/22 1610    Gilda Crease, MD 09/11/22 [phone]

## 2022-09-07 NOTE — ED Provider Notes (Signed)
Pt with recent admission w sepsis, hip infection/osteo, infected decubitus ulcers, etc. Returns with fevers, elevated wbc and inflammatory markers, worsening appearance of wounds and acute/chronic osteo on CT imaging.   Family practice consulted for admission, discussed pt, will see/admit. ID also has been consulted and has made some antibiotic recommendations. Ortho here has recently operated on patient for same issue. MRI is pending.  Would recommend ortho/Dr Dudas service see/follow during admission. Should MRI and/or our ortho eval feel pt needs eventual transfer to another facility for additional ortho procedure(s), they will need to see/assess and facilitate with their colleague at other facility.        Cathren Laine, MD 09/07/22 [phone]

## 2022-09-07 NOTE — Assessment & Plan Note (Addendum)
Patient required 5 units of PRBCs previous admission, most recent transfusion 8/9.  Hemoglobin currently stable.  Has required transfusion with blood from [facility] because of anti-E antibodies. - Continue to trend CBC daily - Transfusion threshold Hgb<7

## 2022-09-07 NOTE — Assessment & Plan Note (Signed)
Last changed 8/4, possible cystitis or ureteritis noted on admission CT, will CTM UOP. -Strict I's and O's

## 2022-09-07 NOTE — ED Notes (Signed)
Patient transported to MRI 

## 2022-09-07 NOTE — Progress Notes (Signed)
Pharmacy Antibiotic Note  Anthony [name] is a 52 y.o. male admitted on 09/06/2022 with pelvic osteomyelitis and pressure ulcer.  Patient was recently admitted and discharged on Cubicin and fluconazole.  Pharmacy has been consulted for Merrem dosing.  Renal function stable, afebrile, WBC 13.7.  Plan: Merrem 1gm IV Q8H Zyvox and fluconazole per MD Monitor renal fxn, clinical progress  Height: 5\' 8"  (172.7 cm) Weight: 71.2 kg (157 lb) IBW/kg (Calculated) : 68.4  Temp (24hrs), Avg:99.3 F (37.4 C), Min:98.7 F (37.1 C), Max:100.1 F (37.8 C)  Recent Labs  Lab 09/01/22 0619 09/06/22 1710 09/06/22 1730 09/06/22 1734  WBC 11.2* 13.7*  --   --   CREATININE  --  0.79  --  0.70  LATICACIDVEN  --   --  1.8  --     Estimated Creatinine Clearance: 104.5 mL/min (by C-G formula based on SCr of 0.7 mg/dL).    Allergies  Allergen Reactions   Firvanq [Vancomycin] Itching    7/5 Cefepime >>7/16 7/5 Dapto >> 7/25, 7/29 >> 9/12 - OPAT 7/5 Flagyl >>7/16 7/25 Cipro >> 7/29 7/25 Doxy >> 7/29 7/16 Zosyn >> 7/25, 7/29 >> 7/29 Merrem 7/30 >> 8/6, restart 8/18 >> Micafungin 8/4 >> 8/7 Fluc through 9/12 - OPAT Zyvox 8/17 >>   7/6 BCx x 2: negF 7/5 BCx x2: Proteus vulgaris (R: Amp, S: Cefepime, Ceftaz, Cipro, Bactrim).  Strep Dysgalactiae 7/5 BCID: Strep sp + Proteus  7/13 BCx2: negF 7/31 surgical tissue cx: ngtd 7/29 blood>>negF 7/31 BCx2: ngtd 7/31 hip>>enterococcus faecium/faecalis (left hip is R-amp & S-vanco; right hip is S-amp & R-vanco), rare Candida orthopsilosis (must be sens flucon)   [name] D. Laney Potash, PharmD, BCPS, BCCCP 09/07/2022, 9:55 AM

## 2022-09-07 NOTE — Progress Notes (Signed)
FMTS Brief Progress Note  S: Seen at bedside with Dr. Rexene Alberts. Tells me he feels "pretty good." Reviewed plan for abx overnight and then ortho evaluation tomorrow to see if he would benefit from further debridement here vs transfer to a tertiary center.  Also need to consider [facility] and PICC replacement should he fever further.    O: BP (!) 107/55   Pulse 99   Temp 99.2 F (37.3 C) (Oral)   Resp 18   Ht 5\' 8"  (1.727 m)   Wt 71.2 kg   SpO2 98%   BMI 23.87 kg/m   Gen: In good spirits and NAD, conversant and mentating well   A/P: Sepsis Likely 2/2 chronic decubitus ulcers. - On Merrem, linezolid, and fluconazole  - Bcx pending - Plan per ID should he fever further:  Consider changing out [facility] and send fresh urine sample for culture -- unable to tell if uti or not, but adding gram negative coverage as mentioned above If negative bcx and still fever, then consider             A) remove picc             B) if A fails, and no option to manage sacral ulcer here in terms of debridement, consider transferring to tertiary care center, or discuss hospice  - Orders reviewed. Labs for AM ordered, - If condition changes, plan includes SPC and PICC exchange as above   Alicia Amel, MD 09/07/2022, 9:00 PM PGY-3, [facility] Night Resident  Please page [phone] with questions.

## 2022-09-07 NOTE — Hospital Course (Signed)
Anthony [name] is a 53 y.o.male with a history of spina bifida with paraplegia and chronic osteomyelitis 2/2 sacral ulcers who was admitted to the [facility] Teaching Service at [facility] for acute sepsis. His hospital course is detailed below:  Febrile illness Pt presented to ED with fever, leukocytosis, generalized weakness. Pt was recently hospitalized 7/5-8/12 for extensive sepsis 2/2 osteomyelitis of chronic wounds, on multiple antibiotics. Pt was discharged to SNF on 8/12 with daptomycin and fluconazole. Stated that he did not believe his wound dressings were being changed enough and that he did not receive some doses of his antibiotics/antifungals at his SNF. ID was consulted and recommended meropenem, linezolid, and zosyn. Orthopedic surgery was consulted.     Other chronic conditions were medically managed with home medications and formulary alternatives as necessary (PVCs, HLD)  PCP Follow-up Recommendations: Needs clear recs and criteria at discharge for SNF readmission to hospital*** Held patient's rosuvastatin 40 mg when starting daptomycin due to rhabdo risk, will need to resume when finishes dapto course on 9/12 Has ID follow up on 9/10

## 2022-09-07 NOTE — ED Notes (Signed)
Pt inquiring about something for anxiety/sleep to help him rest tonight.  This RN paged teaching service to inquire about same.

## 2022-09-07 NOTE — H&P (Cosign Needed Addendum)
Hospital Admission History and Physical Service Pager: [phone]  Patient name: Anthony [name] Medical record number: [id number] Date of Birth: 16-Jul-1970 Age: 52 y.o. Gender: male  Primary Care Provider: Erick Alley, DO Consultants: ID, Orthopedic Surgery Code Status: Full, which was confirmed with family present at bedside  Preferred Emergency Contact:  Contact Information     Name Relation Home Work Mobile   Bosley,MARIE Mother [phone]        Other Contacts   None on File    Chief Complaint: Sepsis  Assessment and Plan: Anthony [name] is a 52 y.o. male presenting hypotensive with subjective fevers, worsening wound drainage from SNF.  Differential for this patient's presentation includes sepsis versus sequelae of chronic osteomyelitis with possible sources of infection including cystitis, urethritis, PICC line hematogenous, chronic osteomyelitis/septic arthritis, and sacral wounds.  Since arrival, patient has not had a true fever with Tmax of 100.1 Fahrenheit, though he did present hypotensive and tachypneic indicating a qSOFA score 2 and high likelihood of sepsis.  Of note are his elevated CRP and ESR, though these may be somewhat chronically elevated given his chronic osteomyelitis.  Given patient's recent hospitalization and extensive antibiotic regimen, unclear what other possible organisms he could have that are uncovered and source control is a priority, with each of the above sources important to address.  Concern exists for inconsistent wound care and missing Abx doses outpatient.  Will continue to follow ID lead for antibiotics and source control. Assessment & Plan Acute sepsis (HCC) Status post 2 L, currently normotensive and not tachypneic.  Appears stable and well.  Awaiting MRI results to determine acuity of osteomyelitis relative to current presentation. -Admit to FMTS, progressive care, attending Dr. Tawanna Cooler McDiarmid -ID on board, appreciate recs and assistance  finding source -Orthopedic surgery consulted -Follow-up MRI, discuss with orthopedic surgery need for I&D versus hydrotherapy -Blood cultures x 2 drawn, PICC line culture x 1 drawn (8/18) -Consider removing PICC line or suprapubic catheter if continues to fever on antibiotics -Trend CBC and fever curve daily -Currently holding antipyretics to see if patient fevers, can restart antipyretics if febrile -WOC consult for wound care -Per ID, broadened antibiotic regimen below: -Continue outpatient fluconazole 400 mg daily (8/8-) -Meropenem per pharmacy dosing (8/17-) -Linezolid 600 mg Q12 hours (8/17-) -trend LFTs Anemia of infection and chronic disease Patient required 5 units of PRBCs previous admission, most recent transfusion 8/9.  Hemoglobin currently stable.  Has required transfusion with blood from [place] because of anti-E antibodies. - Continue to trend CBC daily - Transfusion threshold Hgb<7 Acute deep vein thrombosis (DVT) of axillary vein of right upper extremity (HCC) DVT of right upper extremity noted on 7/12, asymptomatic today without shortness of breath or chest pain. - On Eliquis 5 mg twice daily (7/12 - 10/12) History of creation of ostomy [facility]) Diverting colostomy 7/23 for infection prevention.  Ostomy well cared for on exam. - Bowel regimen: Senna 8.6 mg daily, Psyllium 1 packet daily, and  Lactulose 10 g BID Suprapubic catheter (HCC) Last changed 8/4, possible cystitis or ureteritis noted on admission CT, will CTM UOP. -Strict I's and O's  Chronic and Stable Medical Problems:  PVCs: Flecainide 50 mg BID, metoprolol 50 mg HLD: rosuvastatin 40 mg - hold due to interaction with antibiotic  FEN/GI: Regular diet, tolerating p.o. intake, PICC line and IV in place VTE Prophylaxis: Eliquis 5 mg twice daily  Disposition: Progressive  History of Present Illness: Anthony [name] is a 52 y.o. male with  a pertinent PMH of spina bifida with paraplegia and chronic  osteomyelitis 2/2 sacral ulcers presenting with sepsis.  Patient endorses at the facility his wounds were only be treated once a day, discharge instructions did say 3 times daily.  Additionally, he believes some doses of his antibiotics and antifungals were not administered.  Endorses fevers and chills for the past 2 nights, unknown actual temp.  No change to his fatigue or appetite.  Denies nausea/vomiting, difficulty urinating, abdominal/pelvic pain, cough, SOB, chest pain.  Reports his ostomy bag has been changed adequately at SNF and suprapubic catheter has continued draining.  Patient was recently hospitalized on FMTS from 7/5-8/12 for extensive sepsis 2/2 osteomyelitis of chronic wounds with an extensive antibiotics course including meropenem and several other antibiotics.  Blood cultures from that admission grew Proteus vulgaris and Streptococcus dysgalctiae (7/6) as well as Enterococcus faecium (7/31).  Patient had blood cultures drawn on 8/12 prior to discharge and they have NGTD.  Patient was discharged to SNF on 8/12 on daptomycin and fluconazole planned until 9/12 per ID.  In the ED, patient presented with temp 100.1, WBC 13.7, generalized weakness.  BP initially soft at 98/56, improved with 2136 mL.  CRP found elevated to 20.2 and ESR 120.  CT CAP showing acute on chronic osteomyelitis vs septic arthritis.  ID was consulted and meropenem, linezolid, and Zosyn were given.  MRI was ordered and orthopedic surgery was consulted.  FMTS consulted for admission for sepsis.  Review Of Systems: Per HPI.  Pertinent Past Medical History: - Spina Bifida - HTN - Osteomyelitis - Endocarditis 08/2021 - Sepsis hospitalization 7/5-8/12 - Bilateral hip excisional debridement 08/20/2022 - Diverting colostomy 08/12/2022  Remainder reviewed in history tab.   Pertinent Past Surgical History: - I&D - [name] w/o cardioversion - Hip surgery - Back surgery  Remainder reviewed in history tab.  Pertinent  Social History: Tobacco use: None Alcohol use: None Other Substance use: None Lives by himself at home, but coming from SNF [facility]) now  Pertinent Family History: - Father: Deceased, CAD - Sister: Sudden cardiac death  Remainder reviewed in history tab.   Important Outpatient Medications: - Flecainide 50 mg BID - Lisinopril-hydrochlorothiazide 20-25 mg daily - Metoprolol succinate 50 mg BID - Rosuvastatin 40 mg daily - Daptomycin 850 mg 7/29-9/12 - Fluconazole PO 8/8-9/12  Remainder reviewed in medication history.   Objective: BP 109/64   Pulse 93   Temp 98.8 F (37.1 C)   Resp (!) 22   Ht 5\' 8"  (1.727 m)   Wt 71.2 kg   SpO2 99%   BMI 23.87 kg/m   Physical Exam: General: Deconditioned adult male, paraplegic, resting in bed in no acute discomfort. Eyes: No scleral icterus, scleral injections. ENTM: MMM. Cardiovascular: Intermittently tachycardic, regular rhythm.  Normal S1/S2.  No murmur/rub/gallops. Respiratory: Borderline tachypneic, oxygenating well on room air.  CTAB.  No wheezes/crackles. Gastrointestinal: Nontender to palpation, nondistended.  Suprapubic catheter in place, draining yellow urine.  Abdominal ostomy in place, draining liquid Maciejewski stool, some granulation tissue seen at opening. MSK: Paraplegia, no sensation in the legs. Extremities: PICC line in place in RUE, covered and tissue intact, no surrounding erythema or edema. Derm: Right hip wound with increased drainage, some surrounding erythema and skin tightness.  Some increased drainage of sacral wound provide scrotum.  Left hip wound without drainage.  Wounds packed. Neuro: A&O x 4.  No focal neurological deficit. Psych: Pleasant, appropriate.  Labs:  CBC BMET  Recent Labs  Lab 09/06/22 1710 09/06/22  1729 09/06/22 1734  WBC 13.7*  --   --   HGB 8.5*   < > 10.5*  HCT 28.4*   < > 31.0*  PLT 765*  --   --    < > = values in this interval not displayed.   Recent Labs  Lab 09/06/22 1710  09/06/22 1729 09/06/22 1734  NA 133*   < > 134*  K 3.7   < > 3.8  CL 98  --  101  CO2 22  --   --   BUN 28*  --  25*  CREATININE 0.79  --  0.70  GLUCOSE 112*  --  116*  CALCIUM 8.9  --   --    < > = values in this interval not displayed.     Pertinent additional labs: - CRP 20.2 - ESR 120 - Lactate WNL  VBG    Component Value Date/Time   HCO3 22.6 09/06/2022 1729   TCO2 21 (L) 09/06/2022 1734   ACIDBASEDEF 1.0 09/06/2022 1729   O2SAT 97 09/06/2022 1729   EKG (my own interpretation): Sinus tachycardia, some artifact in I and III possibly d/t shivering, QTc 481, PRWP w/ early transition at V2  Imaging Studies Performed: - CXR: no active disease - CT CAP: Bladder wall thickening w/ multiple calculi suggesting cystitis, periureteral fat stranding on R compaitble w/ ureteritis, bilateral decubitus ulceration involving hips w/ extensive gas in tissues possible acute on chronic osteo vs septic arthritis, extensive reactive lymphadenopathy  Dimitry Sharion Dove, MD 09/07/2022, 7:36 AM [facility]  FPTS Intern pager: [phone], text pages welcome Secure chat group [facility] Teaching Service  I was personally present and performed or re-performed the history, physical exam and medical decision making activities of this service and have verified that the service and findings are accurately documented in the resident's note.  Shelby Mattocks, DO                  09/07/2022, 2:39 PM

## 2022-09-07 NOTE — Assessment & Plan Note (Signed)
DVT of right upper extremity noted on 7/12, asymptomatic today without shortness of breath or chest pain. -On Eliquis 5 mg twice daily (7/12 - 10/12)

## 2022-09-08 ENCOUNTER — Encounter (HOSPITAL_COMMUNITY): Payer: Self-pay | Admitting: Family Medicine

## 2022-09-08 DIAGNOSIS — Z89432 Acquired absence of left foot: Secondary | ICD-10-CM | POA: Diagnosis not present

## 2022-09-08 DIAGNOSIS — M86659 Other chronic osteomyelitis, unspecified thigh: Secondary | ICD-10-CM | POA: Diagnosis not present

## 2022-09-08 DIAGNOSIS — D6859 Other primary thrombophilia: Secondary | ICD-10-CM | POA: Insufficient documentation

## 2022-09-08 DIAGNOSIS — R509 Fever, unspecified: Secondary | ICD-10-CM | POA: Diagnosis not present

## 2022-09-08 DIAGNOSIS — Z89431 Acquired absence of right foot: Secondary | ICD-10-CM | POA: Diagnosis not present

## 2022-09-08 LAB — URINALYSIS, W/ REFLEX TO CULTURE (INFECTION SUSPECTED)
Bilirubin Urine: NEGATIVE
Glucose, UA: NEGATIVE mg/dL
Ketones, ur: 5 mg/dL — AB
Nitrite: NEGATIVE
Protein, ur: 30 mg/dL — AB
RBC / HPF: >50 RBC/hpf (ref 0–5)
Specific Gravity, Urine: 1.016 (ref 1.005–1.030)
pH: 7 (ref 5.0–8.0)

## 2022-09-08 LAB — CBC
HCT: 26.8 % — ABNORMAL LOW (ref 39.0–52.0)
Hemoglobin: 7.9 g/dL — ABNORMAL LOW (ref 13.0–17.0)
MCH: 23.2 pg — ABNORMAL LOW (ref 26.0–34.0)
MCHC: 29.5 g/dL — ABNORMAL LOW (ref 30.0–36.0)
MCV: 78.8 fL — ABNORMAL LOW (ref 80.0–100.0)
Platelets: 607 10*3/uL — ABNORMAL HIGH (ref 150–400)
RBC: 3.4 MIL/uL — ABNORMAL LOW (ref 4.22–5.81)
RDW: 20.5 % — ABNORMAL HIGH (ref 11.5–15.5)
WBC: 9.8 10*3/uL (ref 4.0–10.5)
nRBC: 0 % (ref 0.0–0.2)

## 2022-09-08 LAB — COMPREHENSIVE METABOLIC PANEL
ALT: 42 U/L (ref 0–44)
AST: 21 U/L (ref 15–41)
Albumin: 2 g/dL — ABNORMAL LOW (ref 3.5–5.0)
Alkaline Phosphatase: 159 U/L — ABNORMAL HIGH (ref 38–126)
Anion gap: 11 (ref 5–15)
BUN: 13 mg/dL (ref 6–20)
CO2: 27 mmol/L (ref 22–32)
Calcium: 9.1 mg/dL (ref 8.9–10.3)
Chloride: 97 mmol/L — ABNORMAL LOW (ref 98–111)
Creatinine, Ser: 0.55 mg/dL — ABNORMAL LOW (ref 0.61–1.24)
GFR, Estimated: >60 mL/min (ref >60)
Glucose, Bld: 119 mg/dL — ABNORMAL HIGH (ref 70–99)
Potassium: 3.6 mmol/L (ref 3.5–5.1)
Sodium: 135 mmol/L (ref 135–145)
Total Bilirubin: 0.5 mg/dL (ref 0.3–1.2)
Total Protein: 7.2 g/dL (ref 6.5–8.1)

## 2022-09-08 MED ORDER — HYDROCERIN EX CREA
TOPICAL_CREAM | Freq: Every day | CUTANEOUS | Status: DC
  Filled 2022-09-08: qty 113

## 2022-09-08 NOTE — Assessment & Plan Note (Deleted)
Patient required 5 units of PRBCs previous admission, most recent transfusion 8/9.  Hemoglobin currently slowly trending down.  Has required transfusion with blood from [facility] because of anti-E antibodies. -Continue to trend CBC daily -Transfusion threshold Hgb<7

## 2022-09-08 NOTE — Consult Note (Signed)
WOC Nurse ostomy consult note Stoma type/location: LUQ colostomy Stomal assessment/size:  Peristomal assessment: Last measurement was 1 and 3/4 inches, not measured today Treatment options for stomal/peristomal skin: Skin barrier ring Output: [place]aby stool Ostomy pouching: 2pc. 2 and 3/4 inch pouching system, Pouch is [employer] 649, skin barrier is [place] # 2 and skin barrier rings (to be stretched and placed so that it fits closely around base of stoma) is [place] # [phone]. Education provided: None today Enrolled patient in [employer] DC program: Yes, previously.   WOC Nurse Consult Note: Reason for Consult:Patient is followed by Orthopedics (Dr. Jonathon Bellows) for the bilateral hip and sacral wounds and a consult has been placed for him to see. WOC will provide preliminary guidance for the care of the sacral and hip (truncal) wounds as well as for the bilateral feet and right heel wounds. Patient is known to our service from his last admission. Last seen by my associate on 08/13/22. Wound type:Stage 3 pressure injury to the right heel, pressure injuries to hips and sacrum, ischial tuberosity with tunneling Pressure Injury POA: Yes Measurement:To be obtained by Bedside RN and documented on nursing flow sheet with application of next dressing changes. Wound bed:red, moist Drainage (amount, consistency, odor) moderate Periwound:with evidence of previous healing, scarring, wound edges with epibole. Dressing procedure/placement/frequency: I have provided a mattress replacement with low air loss feature for pressure redistribution and microclimate management. Turning and repositioning is in place and should continue. I have provided bilateral Pressure redistribution heel boots.Topical care to the feet will be with a daily soap and water cleanse, noting that cleansing between the digits is to occur daily. Rinse and dry feet, especially between digits. Eucerin cream is to be applied to the feet, but  not between the toes.  The right heel pressure injury is to be dressed with a piece of silver hydrofiber (Aquacel Ag+, Lawson # [id number]) and topped with dry gauze, secured with silicone foam prior to placing feet into Prevalon boots and changed daily.  I will provide topical care guidance for the truncal wounds using a daily cleanse to the wounds and periwound skin with Vashe pure hypochlorous acid [place] [phone]) and then applying Vashe-moistened gauze to the wounds. This is to be topped with dry gauze, ABD pads and secured with paper tape.  Any orders I have provided for the truncal wounds are superceded by those written by Dr. [name] or any other provider.  WOC nursing team will not follow, but will remain available to this patient, the nursing and medical teams.  Please re-consult if needed.  Thank you for inviting [place] to participate in this patient's Plan of Care.  Ladona Mow, MSN, RN, CNS, GNP, Leda Min, [employer], [employer] phone:  [phone]

## 2022-09-08 NOTE — Assessment & Plan Note (Signed)
Diverting colostomy 7/23 for infection prevention.  Ostomy well cared for on exam. - Bowel regimen: Senna 8.6 mg daily, Psyllium 1 packet daily, and Lactulose 10 g BID

## 2022-09-08 NOTE — Assessment & Plan Note (Signed)
No longer meeting sepsis criteria, resolved.

## 2022-09-08 NOTE — Evaluation (Signed)
Physical Therapy Evaluation Patient Details Name: Anthony [name] MRN: [id number] DOB: 1970-08-20 Today's Date: 09/08/2022  History of Present Illness  Pt is a 52 y/o male presenting from [facility] 8/17 hypotensive with subjective fevers and worsening wound drainage.  Differential for this patient's presentation includes sepsis versus sequelae of chronic osteomyelitis with possible sources of infection including cystitis, urethritis, PICC line hematogenous, chronic osteomyelitis/septic arthritis, and sacral wounds.    PMHx spna bifida, HTN, osteomyelities, paraplegia, sacral/ischial and buttock wounds.  Clinical Impression  Pt admitted with/for the problem stated above.  Pt not at baseline and infection has recently limited his mobility..  Pt currently limited functionally due to the problems listed. ( See problems list.)   Pt will benefit from PT to maximize function and safety in order to get ready for next venue listed below.         If plan is discharge home, recommend the following: Two people to help with walking and/or transfers;A lot of help with bathing/dressing/bathroom;Assistance with cooking/housework;Assist for transportation;Help with stairs or ramp for entrance   Can travel by private vehicle   No    Equipment Recommendations None recommended by PT;Other (comment) (TBD next venue)  Recommendations for Other Services       Functional Status Assessment Patient has had a recent decline in their functional status and demonstrates the ability to make significant improvements in function in a reasonable and predictable amount of time.     Precautions / Restrictions Precautions Precautions: Fall Precaution Comments: paraplegia, colostomy, suprapubic catheter      Mobility  Bed Mobility               General bed mobility comments: not test today    Transfers                        Ambulation/Gait                  Stairs            Wheelchair  Mobility     Tilt Bed    Modified Rankin (Stroke Patients Only)       Balance                                             Pertinent Vitals/Pain Pain Assessment Pain Assessment: Faces Faces Pain Scale: Hurts little more Pain Location: R shoulder with shoulder exercise Pain Descriptors / Indicators: Discomfort, Tender Pain Intervention(s): Monitored during session, Ice applied    Home Living Family/patient expects to be discharged to:: Skilled nursing facility                 Home Equipment: Wheelchair - manual;Grab bars - tub/shower;Transport chair;Other (comment) Additional Comments: has sliding board.    Prior Function Prior Level of Function : Independent/Modified Independent             Mobility Comments: has been able to transfer mod I with/without his new sliding board. ADLs Comments: sits in the tub for bathing, able to complete ADLs w/o assist per pt. Pt able to detect when he needs to have BM and does so on bed pad and cleans himself. Recently obtained transportation services through [facility]     Extremity/Trunk Assessment   Upper Extremity Assessment Upper Extremity Assessment: RUE deficits/detail RUE Deficits / Details: AROM to 60 deg flex and  abd until pain set in.    Lower Extremity Assessment RLE Deficits / Details: no muscle activation; R hip contracted into ~30 degrees flexion, WFL PROM at knee; mild sensation to touch at superior thigh but absent sensation inferior to that bil; multiple toes amputated RLE Sensation: decreased light touch LLE Deficits / Details: no muscle activation; mild sensation to touch at superior thigh but absent sensation inferior to that bil; WFL PROM; multiple toes amputated LLE Sensation: decreased light touch    Cervical / Trunk Assessment Cervical / Trunk Assessment: Other exceptions Cervical / Trunk Exceptions: weakness  Communication   Communication Communication: No apparent difficulties   Cognition Arousal: Alert Behavior During Therapy: WFL for tasks assessed/performed Overall Cognitive Status: Within Functional Limits for tasks assessed                                          General Comments      Exercises General Exercises - Upper Extremity Shoulder Flexion: PROM, Right, 10 reps Shoulder ABduction: PROM, Right, 10 reps Shoulder Horizontal ABduction: PROM, Right, 5 reps Shoulder Horizontal ADduction: AAROM, Supine, Right, 5 reps Elbow Flexion: Strengthening, Right, 10 reps   Assessment/Plan    PT Assessment Patient needs continued PT services  PT Problem List Decreased strength;Decreased activity tolerance;Decreased mobility;Decreased coordination;Pain;Decreased knowledge of use of DME       PT Treatment Interventions DME instruction;Functional mobility training;Therapeutic activities;Therapeutic exercise;Balance training;Neuromuscular re-education;Patient/family education    PT Goals (Current goals can be found in the Care Plan section)  Acute Rehab PT Goals Patient Stated Goal: home eventually\ PT Goal Formulation: With patient Time For Goal Achievement: 09/22/22 Potential to Achieve Goals: Good    Frequency Min 1X/week     Co-evaluation               AM-PAC PT "6 Clicks" Mobility  Outcome Measure Help needed turning from your back to your side while in a flat bed without using bedrails?: None Help needed moving from lying on your back to sitting on the side of a flat bed without using bedrails?: A Little Help needed moving to and from a bed to a chair (including a wheelchair)?: A Little Help needed standing up from a chair using your arms (e.g., wheelchair or bedside chair)?: Total Help needed to walk in hospital room?: Total Help needed climbing 3-5 steps with a railing? : Total 6 Click Score: 13    End of Session   Activity Tolerance: Patient tolerated treatment well Patient left: in bed   PT Visit Diagnosis:  Pain;Muscle weakness (generalized) (M62.81);Other abnormalities of gait and mobility (R26.89) Pain - Right/Left: Right Pain - part of body: Shoulder    Time: 1542-1610 PT Time Calculation (min) (ACUTE ONLY): 28 min   Charges:   PT Evaluation $PT Eval Moderate Complexity: 1 Mod PT Treatments $Therapeutic Exercise: 8-22 mins PT General Charges $$ ACUTE PT VISIT: 1 Visit         09/08/2022  Jacinto Halim., PT Acute Rehabilitation Services [phone]  (office)  Eliseo Gum Devonne Lalani 09/08/2022, 6:25 PM

## 2022-09-08 NOTE — Assessment & Plan Note (Signed)
Last changed 8/4, possible cystitis or ureteritis noted on admission CT, will CTM UOP. -Strict I&Os -Exchange [facility] 8/19 per ID recs

## 2022-09-08 NOTE — Assessment & Plan Note (Signed)
Plt 607, noted to have consistent thrombophilia this admission and most of previous admission on chart review.  This is an acute phase reactant and some elevation is expected, but will CTM.

## 2022-09-08 NOTE — Assessment & Plan Note (Signed)
Patient required 5 units of PRBCs previous admission, most recent transfusion 8/9.  Hemoglobin currently slowly trending down, suspect d/t large amount of labs being drawn.  Requires transfusion with blood compatible with anti-E antibodies, which may need to be ordered from [facility]. -Continue to trend CBC daily -Transfusion threshold Hgb<7 -Labs in Peds tubes, cannot due lab holiday due to need to trend CBC and transaminases

## 2022-09-08 NOTE — Consult Note (Signed)
  Patient is seen in follow-up status post bilateral hip debridement for chronic osteomyelitis of the pelvis.  Surgery 08/20/2022.  Patient states that he had 2 days of fever and chills while in skilled nursing.  Hemoglobin 7.9 white cell count 9.8.  Sed rate 120.  Urine turbid.  Albumin 2.0.  Review of the MRI scan and CT scan shows essentially unchanged chronic osteomyelitis of the pelvis.  No new abscess no new destructive bony changes.  Wound cultures from his surgery show vancomycin-resistant Enterococcus.  I do not feel that further surgical intervention is necessary.  I agree with the current wound therapy with Vashe soaked gauze.  Would continue suppressive antibiotics based on the VRE he cultures.

## 2022-09-08 NOTE — Progress Notes (Signed)
Daily Progress Note Intern Pager: [phone]  Patient name: Anthony [name] Medical record number: [id number] Date of birth: 30-Nov-1970 Age: 52 y.o. Gender: male  Primary Care Provider: Erick Alley, DO Consultants: ID, Ortho signed off Code Status: FULL  Pt Overview and Major Events to Date: 8/18 - Admitted, ID and Ortho consulted; started linezolid, meropenem, fluconazole  Assessment and Plan: Anthony [name] is a 52 y.o. male with a pertinent PMH of paraplegia 2/2 spina bifida and chronic osteomyelitis who presented with hypotension and recurrent fevers and was admitted for sepsis of unknown origin, currently receiving Abx and working on source control.  Will replace SPC today and continue to monitor fever curve.  Considering etiology of drug fever for presentation given patient's eosinophilia, will continue to trend CBC with diff. Assessment & Plan Chronic osteomyelitis of pelvic region (HCC) VSS and appears well.  Still afebrile since admission, leukocytosis resolved. -ID on board, appreciate recs and assistance with source control -Orthopedic surgery signed off s/p MRI and evaluation 8/19; no surgical intervention recommended -Blood cultures NGTD @ 2d, wound cultures pending, PICC line culture drawn 8/19 -Replacing [facility] 8/19, consider removing PICC line if continues to fever on antibiotics -Trend CBC and fever curve daily -Currently holding antipyretics to see if patient fevers, can restart antipyretics if febrile -WOC consult for wound care -Per ID, broadened antibiotic regimen below: -Continue outpatient fluconazole 400 mg daily (8/8-) -Meropenem per pharmacy dosing (8/17-) -Linezolid 600 mg Q12 hours (8/17-) -Trend transaminases x3 Suprapubic catheter (HCC) Last changed 8/4, possible cystitis or ureteritis noted on admission CT, will CTM UOP. -Strict I&Os -Exchange [facility] 8/19 per ID recs Anemia of infection and chronic disease Patient required 5 units of PRBCs previous  admission, most recent transfusion 8/9.  Hemoglobin currently slowly trending down, suspect d/t large amount of labs being drawn.  Requires transfusion with blood compatible with anti-E antibodies, which may need to be ordered from [facility]. -Continue to trend CBC daily -Transfusion threshold Hgb<7 -Labs in Peds tubes, cannot due lab holiday due to need to trend CBC and transaminases Thrombophilia (HCC) Plt 607, noted to have consistent thrombophilia this admission and most of previous admission on chart review.  This is an acute phase reactant and some elevation is expected, but will CTM. 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 Diverting colostomy 7/23 for infection prevention.  Ostomy well cared for on exam. - Bowel regimen: Senna 8.6 mg daily, Psyllium 1 packet daily, and Lactulose 10 g BID Acute deep vein thrombosis (DVT) of axillary vein of right upper extremity (HCC) DVT of right upper extremity noted on 7/12, asymptomatic today without shortness of breath or chest pain. -On Eliquis 5 mg twice daily (7/12 - 10/12) Sepsis (HCC) No longer meeting sepsis criteria, resolved.  Chronic and Stable Medical Problems:  PVCs: Flecainide 50 mg BID, metoprolol 50 mg HLD: rosuvastatin 40 mg - hold due to interaction with antibiotic  FEN/GI: Regular diet, tolerating p.o. intake, PICC line and IV in place VTE Prophylaxis: Eliquis 5 mg twice daily Dispo: SNF  pending Abx course and source control . Barriers include infectious workup.  Subjective:  This morning, patient states he feels "fine."  Denies chest pain, palpitations.  Denies fevers, chills.  No pain or sensation in area of sacral or hip wounds.  Appetite normal.  No change from yesterday.  Objective: Temp:  [99 F (37.2 C)-99.7 F (37.6 C)] 99 F (37.2 C) (08/19 0220) Pulse Rate:  [83-112] 84 (08/19 0500) Resp:  [11-32] 21 (08/19 0500) BP: (  106-132)/(47-64) 123/64 (08/19 0500) SpO2:  [96 %-100 %] 100 % (08/19 0500)  Physical Exam: General:  Deconditioned adult, resting comfortably in bed, NAD, alert and at baseline. Cardiovascular: Regular rhythm.  Intermittently tachycardic.  Normal S1/S2. No murmurs, rubs, or gallops appreciated. 2+ radial pulses. Pulmonary: Clear bilaterally to ascultation. No increased WOB, no accessory muscle usage on room air. No wheezes, rales, or crackles. Abdominal: Normoactive bowel sounds, nondistended. No tenderness to deep or light palpation. No rebound or guarding. Suprapubic catheter in place, no surrounding erythema.  Wound ostomy in place and with granulation tissue visible, draining liquid stool. Skin: Sacral, R, and L hip wounds present.  R hip wound with serosanguinous drainage.  All wounds dressed and packed appropriately.  Some erythema surrounding R hip wound. Extremities: No peripheral edema bilaterally, bilateral foot amputations.  PT pulses 2+ bilaterally.  Laboratory: Most recent CBC Lab Results  Component Value Date   WBC 9.8 09/08/2022   HGB 7.9 (L) 09/08/2022   HCT 26.8 (L) 09/08/2022   MCV 78.8 (L) 09/08/2022   PLT 607 (H) 09/08/2022   Most recent BMP    Latest Ref Rng & Units 09/08/2022    1:27 AM  BMP  Glucose 70 - 99 mg/dL 324   BUN 6 - 20 mg/dL 13   Creatinine 4.01 - 1.24 mg/dL 0.27   Sodium 253 - 664 mmol/L 135   Potassium 3.5 - 5.1 mmol/L 3.6   Chloride 98 - 111 mmol/L 97   CO2 22 - 32 mmol/L 27   Calcium 8.9 - 10.3 mg/dL 9.1     Other pertinent labs: - None  New Imaging/Diagnostic Tests: - MR hip: stable osteomyelitis, large decubitus ulcers overall unchanged from 7/13  Elycia Woodside, MD 09/08/2022, 7:09 AM [facility]  FMTS Intern pager: [phone], text pages welcome Secure chat group [facility] Teaching Service

## 2022-09-08 NOTE — Assessment & Plan Note (Signed)
DVT of right upper extremity noted on 7/12, asymptomatic today without shortness of breath or chest pain. -On Eliquis 5 mg twice daily (7/12 - 10/12)

## 2022-09-08 NOTE — Assessment & Plan Note (Signed)
VSS and appears well.  Still afebrile since admission, leukocytosis resolved. -ID on board, appreciate recs and assistance with source control -Orthopedic surgery signed off s/p MRI and evaluation 8/19; no surgical intervention recommended -Blood cultures NGTD @ 2d, wound cultures pending, PICC line culture drawn 8/19 -Replacing [facility] 8/19, consider removing PICC line if continues to fever on antibiotics -Trend CBC and fever curve daily -Currently holding antipyretics to see if patient fevers, can restart antipyretics if febrile -WOC consult for wound care -Per ID, broadened antibiotic regimen below: -Continue outpatient fluconazole 400 mg daily (8/8-) -Meropenem per pharmacy dosing (8/17-) -Linezolid 600 mg Q12 hours (8/17-) -Trend transaminases x3

## 2022-09-08 NOTE — Assessment & Plan Note (Deleted)
DVT of right upper extremity noted on 7/12, asymptomatic today without shortness of breath or chest pain. -On Eliquis 5 mg twice daily (7/12 - 10/12)

## 2022-09-08 NOTE — Progress Notes (Signed)
Suprapubic Catheter Insertion & Replacement Performing Physician: Nelia Shi, MD  Informed consent was obtained.  Time out performed.  Previous catheter was deflated and removed.  The area surrounding the suprapubic cystostomy was prepared and cleaned with povidone-iodine swab stick and wiped clean with alcohol prep.  Using sterile technique, a size 20 French Foley catheter was inserted and catheter was inflated to 5 cc before verifying that it was resting in place securely.  The patient tolerated the procedure well without complications and urine flow was noted immediately after insertion of Foley.

## 2022-09-08 NOTE — ED Notes (Signed)
ED TO INPATIENT HANDOFF REPORT  ED Nurse Name and Phone #: Marcelino Duster [phone]  S Name/Age/Gender Anthony [name] 52 y.o. male Room/Bed: 021C/021C  Code Status   Code Status: Full Code  Home/SNF/Other Skilled nursing facility [facility] Patient oriented to: self, place, time, and situation Is this baseline? Yes   Triage Complete: Triage complete  Chief Complaint Sepsis [facility]) [A41.9]  Triage Note Per EMS report, pt was treated here last week for sepsis. Over the last 3 days his WBC has gone from 10-15. Pt has 2 sacral stage IV wounds. L lower ostomy and suprapubic catheter. PICC line in L upper arm placed here last week. Pt reports he has a blood clot in his R arm.     Allergies Allergies  Allergen Reactions   Firvanq [Vancomycin] Itching    Level of Care/Admitting Diagnosis ED Disposition     ED Disposition  Admit   Condition  --   Comment  Hospital Area: [facility] [[id number]]  Level of Care: Progressive [102]  Admit to Progressive based on following criteria: MULTISYSTEM THREATS such as stable sepsis, metabolic/electrolyte imbalance with or without encephalopathy that is responding to early treatment.  May admit patient to [name] or [facility] if equivalent level of care is available:: No  Covid Evaluation: Asymptomatic - no recent exposure (last 10 days) testing not required  Diagnosis: Sepsis [facility]) [[id number]]  Admitting Physician: Nelia Shi [[id number]]  Attending Physician: Acquanetta Belling D [[id number]]  Certification:: I certify this patient will need inpatient services for at least 2 midnights  Expected Medical Readiness: 09/09/2022          B Medical/Surgery History Past Medical History:  Diagnosis Date   Anxiety    Chronic indwelling Foley catheter    Chronic ulcer of sacral region (HCC) 07/29/2022   Complication of anesthesia    woken up in surgery before   Dysrhythmia    Endocarditis of mitral valve     Hypertension    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/24/2017 01/24/2017   Osteomyelitis (HCC)    left hip   Paraplegia (HCC)    secondary to Spina Bifida   Polymicrobial bacteremia with sepsis:    Pressure injury of skin of left buttock 05/13/2021   PVCs (premature ventricular contractions)    S/P debridement 10/30/2021   Septic shock (HCC)    due to osteomyelitis   Slow transit constipation 03/06/2017   Spina bifida    Status post debridement 01/24/2017   Wheelchair bound    Past Surgical History:  Procedure Laterality Date   APPLICATION OF WOUND VAC  09/11/2021   Procedure: APPLICATION OF WOUND VAC;  Surgeon: [name], MD;  Location: [facility] OR;  Service: Orthopedics;;   BACK SURGERY     BUBBLE STUDY  09/09/2021   Procedure: BUBBLE STUDY;  Surgeon: Quintella Reichert, MD;  Location: [facility] ENDOSCOPY;  Service: Cardiovascular;;   HIP SURGERY     I & D EXTREMITY Left 09/11/2021   Procedure: LEFT HIP DEBRIDEMENT AND REMOVAL FEMORAL HEAD;  Surgeon: [name], MD;  Location: [facility] OR;  Service: Orthopedics;  Laterality: Left;   I & D EXTREMITY Left 09/13/2021   Procedure: DEBRIDEMENT LEFT HIP;  Surgeon: [name], MD;  Location: [facility] OR;  Service: Orthopedics;  Laterality: Left;   I & D EXTREMITY Left 09/18/2021   Procedure: DEBRIDEMENT LEFT HIP, WOUND VAC EXCHANGE;  Surgeon: [name],  Randa Evens, MD;  Location: [facility] OR;  Service: Orthopedics;  Laterality: Left;   I & D EXTREMITY Left 10/30/2021   Procedure: LEFT HIP DEBRIDEMENT WITH KERECIS PLACEMENT;  Surgeon: [name], MD;  Location: [facility] OR;  Service: Orthopedics;  Laterality: Left;   I & D EXTREMITY Bilateral 08/20/2022   Procedure: BILATERAL HIP DEBRIDEMENT;  Surgeon: [name], MD;  Location: [facility] OR;  Service: Orthopedics;  Laterality: Bilateral;   INCISION AND DRAINAGE OF WOUND N/A 01/24/2017   Procedure: IRRIGATION AND DEBRIDEMENT WOUND- BUTTOCK ABSCESS;  Surgeon: Almond Lint,  MD;  Location: [facility] ORS;  Service: General;  Laterality: N/A;   LAPAROSCOPIC LOOP COLOSTOMY N/A 08/12/2022   Procedure: LAPAROSCOPIC LOOP SIGMOID COLOSTOMY;  Surgeon: Emelia Loron, MD;  Location[facility] OR;  Service: General;  Laterality: N/A;   [name] WITHOUT CARDIOVERSION N/A 09/09/2021   Procedure: TRANSESOPHAGEAL ECHOCARDIOGRAM ([name]);  Surgeon: Quintella Reichert, MD;  Location: [facility] ENDOSCOPY;  Service: Cardiovascular;  Laterality: N/A;   [name] WITHOUT CARDIOVERSION N/A 08/04/2022   Procedure: TRANSESOPHAGEAL ECHOCARDIOGRAM;  Surgeon: Chrystie Nose, MD;  Location: [facility] INVASIVE CV LAB;  Service: Cardiovascular;  Laterality: N/A;     A IV Location/Drains/Wounds Patient Lines/Drains/Airways Status     Active Line/Drains/Airways     Name Placement date Placement time Site Days   PICC Single Lumen 07/31/22 Left Basilic 44 cm 0 cm 07/31/22  6295  Basilic  39   Negative Pressure Wound Therapy Hip Left 09/13/21  1213  --  360   Negative Pressure Wound Therapy Hip Left;Anterior 10/30/21  1308  --  313   Colostomy LLQ 08/12/22  0940  LLQ  27   Suprapubic Catheter Latex 20 Fr. 07/30/22  1821  Latex  40   Incision - 3 Ports Abdomen 1: Umbilicus Mid;Left Upper;Left 08/12/22  0958  -- 27   Pressure Injury 01/25/17 Deep Tissue Injury - Purple or maroon localized area of discolored intact skin or blood-filled blister due to damage of underlying soft tissue from pressure and/or shear. dark patch of tissue with thin layer of superfacial intact 01/25/17  0230  -- 2052   Pressure Injury 01/28/17 Unstageable - Full thickness tissue loss in which the base of the ulcer is covered by slough (yellow, tan, gray, green or Kirlin) and/or eschar (tan, Pennebaker or black) in the wound bed. HYDRO 01/28/17  1150  -- 2049   Pressure Injury 09/04/21 Ischial tuberosity Left Stage 4 - Full thickness tissue loss with exposed bone, tendon or muscle. tunneling to hardpoint 09/04/21  1330  -- 369   Pressure Injury 09/04/21 Buttocks Left  Stage 3 -  Full thickness tissue loss. Subcutaneous fat may be visible but bone, tendon or muscle are NOT exposed. deep hole @ 1cm deep by 1cm diameter hole with red edges 09/04/21  1330  -- 369   Pressure Injury 10/31/21 Hip Left Unstageable - Full thickness tissue loss in which the base of the injury is covered by slough (yellow, tan, gray, green or Wender) and/or eschar (tan, Moodie or black) in the wound bed. 10/31/21  2000  -- 312   Pressure Injury 10/31/21 Hip Left Unstageable - Full thickness tissue loss in which the base of the injury is covered by slough (yellow, tan, gray, green or Eutsler) and/or eschar (tan, Berko or black) in the wound bed. 10/31/21  2000  -- 312   Pressure Injury 07/25/22 Hip Right Stage 4 - Full thickness tissue loss with exposed bone, tendon or muscle. large open  pink red 07/25/22  1500  -- 45   Pressure Injury 07/25/22 Sacrum Medial Stage 4 - Full thickness tissue loss with exposed bone, tendon or muscle. 07/25/22  1500  -- 45   Pressure Injury 07/25/22 Sacrum Medial Stage 2 -  Partial thickness loss of dermis presenting as a shallow open injury with a red, pink wound bed without slough. 07/25/22  1500  -- 45   Pressure Injury 08/21/22 Heel Right Stage 2 -  Partial thickness loss of dermis presenting as a shallow open injury with a red, pink wound bed without slough. 08/21/22  2340  -- 18   Wound / Incision (Open or Dehisced) 09/06/21 Foot Anterior;Right 09/06/21  0800  Foot  367   Wound / Incision (Open or Dehisced) 10/31/21 Other (Comment) Groin fistula 10/31/21  0406  Groin  312   Wound / Incision (Open or Dehisced) 08/31/22 Buttocks Right 08/31/22  1015  Buttocks  8            Intake/Output Last 24 hours  Intake/Output Summary (Last 24 hours) at 09/08/2022 1059 Last data filed at 09/08/2022 0116 Gross per 24 hour  Intake 800 ml  Output 1225 ml  Net -425 ml    Labs/Imaging Results for orders placed or performed during the hospital encounter of 09/06/22 (from  the past 48 hour(s))  CBC with Differential     Status: Abnormal   Collection Time: 09/06/22  5:10 PM  Result Value Ref Range   WBC 13.7 (H) 4.0 - 10.5 K/uL   RBC 3.64 (L) 4.22 - 5.81 MIL/uL   Hemoglobin 8.5 (L) 13.0 - 17.0 g/dL   HCT 95.6 (L) 21.3 - 08.6 %   MCV 78.0 (L) 80.0 - 100.0 fL   MCH 23.4 (L) 26.0 - 34.0 pg   MCHC 29.9 (L) 30.0 - 36.0 g/dL   RDW 57.8 (H) 46.9 - 62.9 %   Platelets 765 (H) 150 - 400 K/uL   nRBC 0.0 0.0 - 0.2 %   Neutrophils Relative % 64 %   Neutro Abs 8.9 (H) 1.7 - 7.7 K/uL   Lymphocytes Relative 14 %   Lymphs Abs 1.9 0.7 - 4.0 K/uL   Monocytes Relative 14 %   Monocytes Absolute 1.9 (H) 0.1 - 1.0 K/uL   Eosinophils Relative 5 %   Eosinophils Absolute 0.6 (H) 0.0 - 0.5 K/uL   Basophils Relative 2 %   Basophils Absolute 0.2 (H) 0.0 - 0.1 K/uL   Immature Granulocytes 1 %   Abs Immature Granulocytes 0.12 (H) 0.00 - 0.07 K/uL    Comment: Performed at [facility], [address], [place], [place] [address]  Comprehensive metabolic panel     Status: Abnormal   Collection Time: 09/06/22  5:10 PM  Result Value Ref Range   Sodium 133 (L) 135 - 145 mmol/L   Potassium 3.7 3.5 - 5.1 mmol/L   Chloride 98 98 - 111 mmol/L   CO2 22 22 - 32 mmol/L   Glucose, Bld 112 (H) 70 - 99 mg/dL    Comment: Glucose reference range applies only to samples taken after fasting for at least 8 hours.   BUN 28 (H) 6 - 20 mg/dL   Creatinine, Ser 3.24 0.61 - 1.24 mg/dL   Calcium 8.9 8.9 - 40.1 mg/dL   Total Protein 7.7 6.5 - 8.1 g/dL   Albumin 2.1 (L) 3.5 - 5.0 g/dL   AST 37 15 - 41 U/L   ALT 55 (H) 0 -  44 U/L   Alkaline Phosphatase 196 (H) 38 - 126 U/L   Total Bilirubin 0.5 0.3 - 1.2 mg/dL   GFR, Estimated >40 >10 mL/min    Comment: (NOTE) Calculated using the CKD-EPI Creatinine Equation (2021)    Anion gap 13 5 - 15    Comment: Performed at [facility], [address], [place], [place] [address]  Blood culture (routine x 2)     Status: None (Preliminary result)    Collection Time: 09/06/22  5:10 PM   Specimen: BLOOD  Result Value Ref Range   Specimen Description BLOOD BLOOD LEFT FOREARM    Special Requests      BOTTLES DRAWN AEROBIC AND ANAEROBIC Blood Culture results may not be optimal due to an inadequate volume of blood received in culture bottles   Culture      NO GROWTH 2 DAYS Performed at [facility], [address], [place], [place] [address]    Report Status PENDING   Sedimentation rate     Status: Abnormal   Collection Time: 09/06/22  5:10 PM  Result Value Ref Range   Sed Rate 120 (H) 0 - 16 mm/hr    Comment: Performed at [facility], [address], [place], [place] [address]  C-reactive protein     Status: Abnormal   Collection Time: 09/06/22  5:10 PM  Result Value Ref Range   CRP 20.2 (H) <1.0 mg/dL    Comment: Performed at [facility], [address], [place], [place] [address]  I-Stat venous blood gas, ED     Status: Abnormal   Collection Time: 09/06/22  5:29 PM  Result Value Ref Range   pH, Ven 7.462 (H) 7.25 - 7.43   pCO2, Ven 31.7 (L) 44 - 60 mmHg   pO2, Ven 87 (H) 32 - 45 mmHg   Bicarbonate 22.6 20.0 - 28.0 mmol/L   TCO2 24 22 - 32 mmol/L   O2 Saturation 97 %   Acid-base deficit 1.0 0.0 - 2.0 mmol/L   Sodium 134 (L) 135 - 145 mmol/L   Potassium 3.9 3.5 - 5.1 mmol/L   Calcium, Ion 1.14 (L) 1.15 - 1.40 mmol/L   HCT 28.0 (L) 39.0 - 52.0 %   Hemoglobin 9.5 (L) 13.0 - 17.0 g/dL   Sample type VENOUS   I-Stat CG4 Lactic Acid, ED     Status: None   Collection Time: 09/06/22  5:30 PM  Result Value Ref Range   Lactic Acid, Venous 1.8 0.5 - 1.9 mmol/L  I-stat chem 8, ed     Status: Abnormal   Collection Time: 09/06/22  5:34 PM  Result Value Ref Range   Sodium 134 (L) 135 - 145 mmol/L   Potassium 3.8 3.5 - 5.1 mmol/L   Chloride 101 98 - 111 mmol/L   BUN 25 (H) 6 - 20 mg/dL   Creatinine, Ser 8.75 0.61 - 1.24 mg/dL   Glucose, Bld 643 (H) 70 - 99 mg/dL    Comment: Glucose reference range applies only  to samples taken after fasting for at least 8 hours.   Calcium, Ion 1.13 (L) 1.15 - 1.40 mmol/L   TCO2 21 (L) 22 - 32 mmol/L   Hemoglobin 10.5 (L) 13.0 - 17.0 g/dL   HCT 32.9 (L) 51.8 - 84.1 %  Resp panel by RT-PCR (RSV, Flu A&B, Covid) Anterior Nasal Swab     Status: None   Collection Time: 09/06/22  6:25 PM   Specimen:  Anterior Nasal Swab  Result Value Ref Range   SARS Coronavirus 2 by RT PCR NEGATIVE NEGATIVE   Influenza A by PCR NEGATIVE NEGATIVE   Influenza B by PCR NEGATIVE NEGATIVE    Comment: (NOTE) The Xpert Xpress SARS-CoV-2/FLU/RSV plus assay is intended as an aid in the diagnosis of influenza from Nasopharyngeal swab specimens and should not be used as a sole basis for treatment. Nasal washings and aspirates are unacceptable for Xpert Xpress SARS-CoV-2/FLU/RSV testing.  Fact Sheet for Patients: BloggerCourse.com  Fact Sheet for Healthcare Providers: SeriousBroker.it  This test is not yet approved or cleared by the [place] FDA and has been authorized for detection and/or diagnosis of SARS-CoV-2 by FDA under an Emergency Use Authorization (EUA). This EUA will remain in effect (meaning this test can be used) for the duration of the COVID-19 declaration under Section 564(b)(1) of the Act, 21 U.S.C. section 360bbb-3(b)(1), unless the authorization is terminated or revoked.     Resp Syncytial Virus by PCR NEGATIVE NEGATIVE    Comment: (NOTE) Fact Sheet for Patients: BloggerCourse.com  Fact Sheet for Healthcare Providers: SeriousBroker.it  This test is not yet approved or cleared by the [place] FDA and has been authorized for detection and/or diagnosis of SARS-CoV-2 by FDA under an Emergency Use Authorization (EUA). This EUA will remain in effect (meaning this test can be used) for the duration of the COVID-19 declaration under Section 564(b)(1) of the Act,  21 U.S.C. section 360bbb-3(b)(1), unless the authorization is terminated or revoked.  Performed at [facility], [address], [place], [place] [address]   Blood culture (routine x 2)     Status: None (Preliminary result)   Collection Time: 09/06/22  6:50 PM   Specimen: BLOOD LEFT HAND  Result Value Ref Range   Specimen Description BLOOD LEFT HAND    Special Requests      BOTTLES DRAWN AEROBIC ONLY Blood Culture adequate volume   Culture      NO GROWTH 2 DAYS Performed at [facility], [address], [place], [place] [address]    Report Status PENDING   Aerobic/Anaerobic Culture w Gram Stain (surgical/deep wound)     Status: Abnormal (Preliminary result)   Collection Time: 09/06/22 10:04 PM   Specimen: Wound; Abscess  Result Value Ref Range   Specimen Description WOUND SITE NOT SPECIFIED    Special Requests NONE    Gram Stain      FEW WBC PRESENT, PREDOMINANTLY MONONUCLEAR FEW GRAM POSITIVE COCCI IN PAIRS FEW GRAM NEGATIVE RODS Performed at [facility], [address] [place], [place] [address]    Culture MULTIPLE ORGANISMS PRESENT, NONE PREDOMINANT (A)    Report Status PENDING   CBC     Status: Abnormal   Collection Time: 09/07/22 10:05 AM  Result Value Ref Range   WBC 10.4 4.0 - 10.5 K/uL   RBC 3.57 (L) 4.22 - 5.81 MIL/uL   Hemoglobin 8.2 (L) 13.0 - 17.0 g/dL   HCT 91.4 (L) 78.2 - 95.6 %   MCV 78.2 (L) 80.0 - 100.0 fL   MCH 23.0 (L) 26.0 - 34.0 pg   MCHC 29.4 (L) 30.0 - 36.0 g/dL   RDW 21.3 (H) 08.6 - 57.8 %   Platelets 621 (H) 150 - 400 K/uL   nRBC 0.0 0.0 - 0.2 %    Comment: Performed at [facility], [address], [place], [place] [address]  Basic metabolic panel     Status:  Abnormal   Collection Time: 09/07/22 10:05 AM  Result Value Ref Range   Sodium 137 135 - 145 mmol/L   Potassium 3.9 3.5 - 5.1 mmol/L   Chloride 100 98 - 111 mmol/L   CO2 23 22 - 32 mmol/L   Glucose, Bld 94 70 - 99 mg/dL    Comment: Glucose reference range  applies only to samples taken after fasting for at least 8 hours.   BUN 13 6 - 20 mg/dL   Creatinine, Ser 1.61 (L) 0.61 - 1.24 mg/dL   Calcium 9.4 8.9 - 09.6 mg/dL   GFR, Estimated >04 >54 mL/min    Comment: (NOTE) Calculated using the CKD-EPI Creatinine Equation (2021)    Anion gap 14 5 - 15    Comment: Performed at [facility], [address], [place], [place] [address]  CBC     Status: Abnormal   Collection Time: 09/08/22  1:27 AM  Result Value Ref Range   WBC 9.8 4.0 - 10.5 K/uL   RBC 3.40 (L) 4.22 - 5.81 MIL/uL   Hemoglobin 7.9 (L) 13.0 - 17.0 g/dL   HCT 91.4 (L) 78.2 - 95.6 %   MCV 78.8 (L) 80.0 - 100.0 fL   MCH 23.2 (L) 26.0 - 34.0 pg   MCHC 29.5 (L) 30.0 - 36.0 g/dL   RDW 21.3 (H) 08.6 - 57.8 %   Platelets 607 (H) 150 - 400 K/uL   nRBC 0.0 0.0 - 0.2 %    Comment: Performed at [facility], [address] [place], [place] [address]  Comprehensive metabolic panel     Status: Abnormal   Collection Time: 09/08/22  1:27 AM  Result Value Ref Range   Sodium 135 135 - 145 mmol/L   Potassium 3.6 3.5 - 5.1 mmol/L   Chloride 97 (L) 98 - 111 mmol/L   CO2 27 22 - 32 mmol/L   Glucose, Bld 119 (H) 70 - 99 mg/dL    Comment: Glucose reference range applies only to samples taken after fasting for at least 8 hours.   BUN 13 6 - 20 mg/dL   Creatinine, Ser 9.52 (L) 0.61 - 1.24 mg/dL   Calcium 9.1 8.9 - 84.1 mg/dL   Total Protein 7.2 6.5 - 8.1 g/dL   Albumin 2.0 (L) 3.5 - 5.0 g/dL   AST 21 15 - 41 U/L   ALT 42 0 - 44 U/L   Alkaline Phosphatase 159 (H) 38 - 126 U/L   Total Bilirubin 0.5 0.3 - 1.2 mg/dL   GFR, Estimated >32 >44 mL/min    Comment: (NOTE) Calculated using the CKD-EPI Creatinine Equation (2021)    Anion gap 11 5 - 15    Comment: Performed at [facility], [address] [place], [place] [address]   MR HIP RIGHT W WO CONTRAST  Result Date: 09/07/2022 CLINICAL DATA:  Septic arthritis of the hip, sepsis EXAM: MRI OF THE RIGHT HIP WITHOUT AND WITH  CONTRAST TECHNIQUE: Multiplanar, multisequence MR imaging was performed both before and after administration of intravenous contrast. CONTRAST:  7mL GADAVIST GADOBUTROL 1 MMOL/ML IV SOLN COMPARISON:  08/02/2022, 09/08/2021 FINDINGS: Bones/Joint/Cartilage Posterior to the sacrum a 6.7 x 2.9 x 4.6 cm complex cystic masslike appearance is again noted which may reflect a meningocele. Osteomyelitis with bony destruction of both proximal femurs, the acetabula, and inferior pubic rami noted with bone marrow edema and enhancement most severe in the right ilium, ischium and proximal femur. Large decubitus ulcers extend  into the hip joint regions bilaterally with osteolysis of the femoral heads bilaterally versus prior resection. Pseudoarticulation of bony structures of the proximal femur with the right iliac bone and separation of the left femur from the left iliac bone. Packing material and gas tracking from the decubitus ulcer into the region of the joints bilaterally. Mild osteoarthritis of the SI joints bilaterally. Muscles and Tendons Severe extensive atrophy of the pelvic musculatures and muscles of bilateral legs. Muscles are normal. Soft tissue Mild soft tissue edema in the subcutaneous fat around the decubitus ulcers bilaterally consistent with cellulitis. Suprapubic catheter is again noted. No soft tissue mass. Mild bilateral lingual lymphadenopathy likely reactive. IMPRESSION: 1. Stable osteomyelitis with bony destruction of both proximal femurs, the acetabula, and inferior pubic rami noted with bone marrow edema and enhancement most severe in the right ilium, ischium and proximal femur. No significant interval change compared with the prior examination. 2. Large decubitus ulcers extend into the hip joint regions bilaterally with osteolysis of the femoral heads bilaterally versus prior resection. Pseudoarticulation of bony structures of the proximal femur with the right iliac bone and separation of the left femur  from the left iliac bone. Packing material and gas tracking from the decubitus ulcer into the region of the joints bilaterally. Mild soft tissue edema in the subcutaneous fat around the decubitus ulcers bilaterally consistent with cellulitis. 3. Overall, no significant interval change compared with 08/02/2022. Electronically Signed   By: Elige Ko M.D.   On: 09/07/2022 14:05   CT CHEST ABDOMEN PELVIS W CONTRAST  Result Date: 09/06/2022 CLINICAL DATA:  Sepsis, sacral wounds. EXAM: CT CHEST, ABDOMEN, AND PELVIS WITH CONTRAST TECHNIQUE: Multidetector CT imaging of the chest, abdomen and pelvis was performed following the standard protocol during bolus administration of intravenous contrast. RADIATION DOSE REDUCTION: This exam was performed according to the departmental dose-optimization program which includes automated exposure control, adjustment of the mA and/or kV according to patient size and/or use of iterative reconstruction technique. CONTRAST:  75mL OMNIPAQUE IOHEXOL 350 MG/ML SOLN COMPARISON:  07/25/2022. FINDINGS: CT CHEST FINDINGS Cardiovascular: The heart is normal in size and there is no pericardial effusion. Few scattered coronary artery calcifications are noted. The aorta and pulmonary trunk are normal in caliber. There is a left PICC line terminating in the right brachiocephalic vein. Mediastinum/Nodes: No enlarged mediastinal, hilar, or axillary lymph nodes. Thyroid gland, trachea, and esophagus demonstrate no significant findings. Lungs/Pleura: Stable atelectasis is noted bilaterally with stable scarring in the right lower lobe. There is a trace left pleural effusion. No pneumothorax. Musculoskeletal: Degenerative changes are present in the thoracic spine. There is suggestion of congenitally short pedicles with degenerative changes in the lower thoracic spine resulting in moderate-to-severe spinal canal stenosis from T9-T12, and most pronounced at T10-T11. No acute osseous abnormality. CT  ABDOMEN PELVIS FINDINGS Hepatobiliary: No focal liver abnormality is seen. No gallstones, gallbladder wall thickening, or biliary dilatation. Pancreas: Unremarkable. No pancreatic ductal dilatation or surrounding inflammatory changes. Spleen: The spleen is mildly enlarged at 14.6 cm. Adrenals/Urinary Tract: The adrenal glands are within normal limits. The kidneys enhance symmetrically. Subcentimeter hypodensities are present in the right kidney which are too small to further characterize. There is a mild hydronephrosis on the right with urothelial enhancement involving the right renal pelvis and ureter with mild periureteral fat stranding. No renal calculus bilaterally. No obstructive uropathy on the left. There is mild bladder wall thickening with multiple bladder calcifications. A suprapubic catheter is in place. Stomach/Bowel: Stomach is within normal limits.  Appendix appears normal. No evidence of bowel wall thickening, distention, or inflammatory changes. No free air or pneumatosis. A left lower quadrant colostomy is noted. Vascular/Lymphatic: Aortic atherosclerosis. Enlarged retroperitoneal lymph nodes are noted measuring up to 1.2 cm at the level of the bifurcation and continuing along the iliac chains bilaterally. Enlarged lymph nodes are present in the inguinal regions bilaterally, greater on the right than on the left. Reproductive: Prostate is unremarkable. Other: No abdominopelvic ascites. A fat containing right inguinal hernia is noted. Musculoskeletal: There are bilateral decubitus ulcers involving the hips and ischial regions with extensive subcutaneous air at the right hip. There is bony destruction of the bilateral hips with progression of erosive changes at the ischium on the left. There is chronic bony deformity of the pubic rami bilaterally. There is a congenitally short pedicles with severe lumbar spinal canal stenosis, unchanged from the previous exam. No definite abscess is seen. IMPRESSION:  1. Bladder wall thickening with multiple bladder calculi and suprapubic catheter in place, suggesting cystitis. 2. Mild hydronephrosis on the right with no obstructing stone. There is urothelial enhancement with periureteral fat stranding on the right, compatible with ureteritis. 3. Bilateral decubitus ulceration involving the hips and ischial regions bilaterally with extensive gas in the soft tissues, greater on the right than on the left. Progressive erosions are noted at the ischium on the left, possible acute on chronic osteomyelitis or septic arthritis. 4. Extensive retroperitoneal, bilateral iliac chain, and inguinal lymphadenopathy, likely reactive. 5. Mild splenomegaly. 6. Left-sided PICC line terminates in the right brachiocephalic vein. Repositioning is suggested. Electronically Signed   By: Thornell Sartorius M.D.   On: 09/06/2022 21:05   DG Chest Portable 1 View  Result Date: 09/06/2022 CLINICAL DATA:  Leukocytosis, sacral decubitus wounds EXAM: PORTABLE CHEST 1 VIEW COMPARISON:  08/20/2022 chest radiograph. FINDINGS: Left PICC terminates over the right paratracheal mediastinum in the region of the junction of the left brachiocephalic vein and SVC. Stable cardiomediastinal silhouette with normal heart size. No pneumothorax. No pleural effusion. Lungs appear clear, with no acute consolidative airspace disease and no pulmonary edema. IMPRESSION: No active disease. Electronically Signed   By: Delbert Phenix M.D.   On: 09/06/2022 17:46    Pending Labs Unresulted Labs (From admission, onward)     Start     Ordered   09/10/22 0500  CBC with Differential/Platelet  Once,   R       Comments: Peds tube    09/08/22 1045   09/09/22 0500  CBC with Differential/Platelet  Tomorrow morning,   R       Comments: Peds tube    09/08/22 1045   09/09/22 0500  Comprehensive metabolic panel  Daily,   R     Comments: Peds tube    09/08/22 1045   09/08/22 0814  Culture, blood (single) w Reflex to ID Panel  Once,    R        09/08/22 0813   09/06/22 2159  Urinalysis, w/ Reflex to Culture (Infection Suspected) -Urine, Clean Catch  Add-on,   AD       Question:  Specimen Source  Answer:  Urine, Clean Catch   09/06/22 2158            Vitals/Pain Today's Vitals   09/08/22 0445 09/08/22 0500 09/08/22 0715 09/08/22 0947  BP:  123/64 134/71 (!) 124/57  Pulse: 83 84 84 91  Resp: 20 (!) 21 (!) 27 19  Temp:    99.2 F (37.3 C)  TempSrc:  Oral  SpO2: 98% 100% 98% 100%  Weight:      Height:      PainSc:    0-No pain    Isolation Precautions No active isolations  Medications Medications  flecainide (TAMBOCOR) tablet 50 mg (50 mg Oral Given 09/08/22 0950)  metoprolol succinate (TOPROL-XL) 24 hr tablet 50 mg (50 mg Oral Given 09/08/22 0949)  nitroGLYCERIN (NITROSTAT) SL tablet 0.4 mg (has no administration in time range)  rosuvastatin (CRESTOR) tablet 40 mg (40 mg Oral Given 09/07/22 2256)  apixaban (ELIQUIS) tablet 5 mg (5 mg Oral Given 09/08/22 0949)  feeding supplement (ENSURE ENLIVE / ENSURE PLUS) liquid 237 mL (237 mLs Oral Not Given 09/08/22 1051)  multivitamin with minerals tablet 1 tablet (1 tablet Oral Given 09/08/22 0953)  omega-3 acid ethyl esters (LOVAZA) capsule 1 g (1 g Oral Given 09/08/22 0953)  zinc sulfate capsule 220 mg (220 mg Oral Given 09/08/22 0953)  ascorbic acid (VITAMIN C) tablet 500 mg (500 mg Oral Given 09/08/22 0952)  loratadine (CLARITIN) tablet 10 mg (10 mg Oral Given 09/08/22 0953)  methocarbamol (ROBAXIN) tablet 1,000 mg (1,000 mg Oral Given 09/08/22 0703)  gabapentin (NEURONTIN) capsule 100 mg (100 mg Oral Given 09/08/22 0949)  lactulose (CHRONULAC) 10 GM/15ML solution 10 g (10 g Oral Given 09/08/22 0954)  linezolid (ZYVOX) IVPB 600 mg (600 mg Intravenous New Bag/Given 09/08/22 1013)  fluconazole (DIFLUCAN) tablet 400 mg (400 mg Oral Given 09/08/22 0950)  meropenem (MERREM) 1 g in sodium chloride 0.9 % 100 mL IVPB (0 g Intravenous Stopped 09/08/22 0739)  sodium chloride flush  (NS) 0.9 % injection 10-40 mL (has no administration in time range)  Chlorhexidine Gluconate Cloth 2 % PADS 6 each (6 each Topical Not Given 09/07/22 1934)  LORazepam (ATIVAN) 2 MG/ML concentrated solution 0.5 mg (has no administration in time range)  psyllium (HYDROCIL/METAMUCIL) 1 packet (1 packet Oral Given 09/08/22 0954)  senna-docusate (Senokot-S) tablet 1 tablet (1 tablet Oral Given 09/07/22 2257)  hydrOXYzine (ATARAX) tablet 25 mg (25 mg Oral Given 09/08/22 0028)  hydrocerin (EUCERIN) cream (has no administration in time range)  lactated ringers bolus 2,136 mL (0 mLs Intravenous Stopped 09/06/22 2049)  hydrOXYzine (ATARAX) tablet 25 mg (25 mg Oral Given 09/06/22 1821)  linezolid (ZYVOX) IVPB 600 mg (0 mg Intravenous Stopped 09/06/22 2231)  meropenem (MERREM) 1 g in sodium chloride 0.9 % 100 mL IVPB (0 g Intravenous Stopped 09/06/22 2121)  acetaminophen (TYLENOL) tablet 1,000 mg (1,000 mg Oral Given 09/06/22 2119)  iohexol (OMNIPAQUE) 350 MG/ML injection 75 mL (75 mLs Intravenous Contrast Given 09/06/22 2032)  alteplase (CATHFLO ACTIVASE) injection 2 mg (2 mg Intracatheter Given 09/07/22 1105)  LORazepam (ATIVAN) 2 MG/ML injection (0.5 mg  Given 09/07/22 1212)  gadobutrol (GADAVIST) 1 MMOL/ML injection 7 mL (7 mLs Intravenous Contrast Given 09/07/22 1312)    Mobility non-ambulatory   Paraplegia   Focused Assessments Cardiac Assessment Handoff:  Cardiac Rhythm: Normal sinus rhythm Lab Results  Component Value Date   CKTOTAL 10 (L) 08/27/2022   Lab Results  Component Value Date   DDIMER 1.22 (H) 11/22/2019   Does the Patient currently have chest pain? No   , Neuro Assessment Handoff:  Swallow screen pass? Yes  Cardiac Rhythm: Normal sinus rhythm       Neuro Assessment: Within Defined Limits Patient alert and oriented x4.          , Sacral Wound  Suprapubic catheter LLQ colostomy   R Recommendations: See Admitting Provider Note  Report given  to:   Additional Notes:  Blood culture sent from PICC line.

## 2022-09-08 NOTE — Assessment & Plan Note (Deleted)
Last changed 8/4, possible cystitis or ureteritis noted on admission CT, will CTM UOP. -Strict I&Os -Exchange [facility] 8/19

## 2022-09-09 ENCOUNTER — Ambulatory Visit: Payer: Medicare HMO | Admitting: Cardiology

## 2022-09-09 DIAGNOSIS — R509 Fever, unspecified: Secondary | ICD-10-CM | POA: Diagnosis not present

## 2022-09-09 DIAGNOSIS — M86659 Other chronic osteomyelitis, unspecified thigh: Secondary | ICD-10-CM | POA: Diagnosis not present

## 2022-09-09 LAB — CBC WITH DIFFERENTIAL/PLATELET
Abs Immature Granulocytes: 0.07 10*3/uL (ref 0.00–0.07)
Basophils Absolute: 0.2 10*3/uL — ABNORMAL HIGH (ref 0.0–0.1)
Basophils Relative: 2 %
Eosinophils Absolute: 0.9 10*3/uL — ABNORMAL HIGH (ref 0.0–0.5)
Eosinophils Relative: 9 %
HCT: 27 % — ABNORMAL LOW (ref 39.0–52.0)
Hemoglobin: 8 g/dL — ABNORMAL LOW (ref 13.0–17.0)
Immature Granulocytes: 1 %
Lymphocytes Relative: 19 %
Lymphs Abs: 1.8 10*3/uL (ref 0.7–4.0)
MCH: 23.7 pg — ABNORMAL LOW (ref 26.0–34.0)
MCHC: 29.6 g/dL — ABNORMAL LOW (ref 30.0–36.0)
MCV: 80.1 fL (ref 80.0–100.0)
Monocytes Absolute: 1.3 10*3/uL — ABNORMAL HIGH (ref 0.1–1.0)
Monocytes Relative: 14 %
Neutro Abs: 5.2 10*3/uL (ref 1.7–7.7)
Neutrophils Relative %: 55 %
Platelets: 556 10*3/uL — ABNORMAL HIGH (ref 150–400)
RBC: 3.37 MIL/uL — ABNORMAL LOW (ref 4.22–5.81)
RDW: 19.9 % — ABNORMAL HIGH (ref 11.5–15.5)
WBC: 9.3 10*3/uL (ref 4.0–10.5)
nRBC: 0 % (ref 0.0–0.2)

## 2022-09-09 LAB — COMPREHENSIVE METABOLIC PANEL
ALT: 32 U/L (ref 0–44)
AST: 15 U/L (ref 15–41)
Albumin: 2 g/dL — ABNORMAL LOW (ref 3.5–5.0)
Alkaline Phosphatase: 136 U/L — ABNORMAL HIGH (ref 38–126)
Anion gap: 13 (ref 5–15)
BUN: 13 mg/dL (ref 6–20)
CO2: 27 mmol/L (ref 22–32)
Calcium: 9.3 mg/dL (ref 8.9–10.3)
Chloride: 96 mmol/L — ABNORMAL LOW (ref 98–111)
Creatinine, Ser: 0.55 mg/dL — ABNORMAL LOW (ref 0.61–1.24)
GFR, Estimated: >60 mL/min (ref >60)
Glucose, Bld: 107 mg/dL — ABNORMAL HIGH (ref 70–99)
Potassium: 3.9 mmol/L (ref 3.5–5.1)
Sodium: 136 mmol/L (ref 135–145)
Total Bilirubin: 0.2 mg/dL — ABNORMAL LOW (ref 0.3–1.2)
Total Protein: 7.2 g/dL (ref 6.5–8.1)

## 2022-09-09 LAB — URINE CULTURE: Culture: NO GROWTH

## 2022-09-09 MED ORDER — SODIUM CHLORIDE 0.9 % IV SOLN
850.0000 mg | Freq: Every day | INTRAVENOUS | Status: DC
  Administered 2022-09-09 – 2022-09-10 (×2): 850 mg via INTRAVENOUS
  Filled 2022-09-09 (×3): qty 17

## 2022-09-09 MED ORDER — SODIUM CHLORIDE 0.9 % IV SOLN: INTRAVENOUS | Status: DC

## 2022-09-09 MED ORDER — SODIUM CHLORIDE 0.9 % IV SOLN
1.0000 g | INTRAVENOUS | Status: DC
  Administered 2022-09-09 – 2022-09-10 (×2): 1 g via INTRAVENOUS
  Filled 2022-09-09 (×4): qty 1000

## 2022-09-09 NOTE — Assessment & Plan Note (Signed)
No longer meeting sepsis criteria, resolved.

## 2022-09-09 NOTE — Progress Notes (Signed)
[facility]  Date of Admission:  09/06/2022   Total days of inpatient antibiotics 4  Principal Problem:   Febrile illness Active Problems:   Suprapubic catheter (HCC)   Anemia of infection and chronic disease   Chronic osteomyelitis of pelvic region [facility])   Acute deep vein thrombosis (DVT) of axillary vein of right upper extremity (HCC)   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   Thrombophilia [facility])          Assessment: 52 year old male with history of fifth and complex chronic sacral ulcer associated with bone and joint and soft tissue infection, urinary stone with chronic suprapubic catheter recent prolonged hospitalization for polymicrobial bacteremia secondary to sacral wound on 6 weeks of Dapto/fluconazole EOT 9/12 admitted for: #Worsening sacral wound -Turn from/for malaise, fever and increased purulent discharge from sacral process.  On presentation Tmax 100.1, WBC 14K. - Leukocytosis and fever have resolved - SPC was exchanged and urine was sent for culture  Recommendations: -DC linezolid, start daptomycin. Reviewed CT chest abdomen pelvis from 8/17, did not note acute lung abnormality to suggest daptomycin induced eosinophilic pneumonia. -Continue meropenem fluconazole while inpatient. - Follow urine cultures from 8/19 in case organism not covered with the above antibiotics -Gave patient appointment with Dr. Drue Second on 9/10.  Will plan on antibiotics till then.  Possibly transition to ertapenem, daptomycin and fluconazole on discharge to complete antibiotics till 9/12.  Reviewed QTc generally between 450 of 480 as such will avoid fluoroquinolones.   Microbiology:   Antibiotics: Linezolid, fluconazole, meropenem Cultures: Blood 8/17 NG Urine  Other 8/19 pending  SUBJECTIVE: Resting in bed.  Interval: Afebrile overnight.   Review of Systems: Review of Systems  All other systems reviewed and are negative.    Scheduled Meds:  apixaban  5  mg Oral BID   ascorbic acid  500 mg Oral BID   Chlorhexidine Gluconate Cloth  6 each Topical Daily   feeding supplement  237 mL Oral TID BM   flecainide  50 mg Oral BID   fluconazole  400 mg Oral Daily   gabapentin  100 mg Oral TID   hydrocerin   Topical Daily   lactulose  10 g Oral BID   loratadine  10 mg Oral Daily   methocarbamol  1,000 mg Oral Q8H   metoprolol succinate  50 mg Oral BID   multivitamin with minerals  1 tablet Oral Daily   omega-3 acid ethyl esters  1 g Oral Daily   psyllium  1 packet Oral Daily   rosuvastatin  40 mg Oral QHS   senna-docusate  1 tablet Oral QHS   zinc sulfate  220 mg Oral Daily   Continuous Infusions:  sodium chloride 10 mL/hr at 09/09/22 1316   linezolid (ZYVOX) IV 600 mg (09/09/22 1159)   meropenem (MERREM) IV 1 g (09/09/22 1056)   PRN Meds:.hydrOXYzine, LORazepam, nitroGLYCERIN, sodium chloride flush Allergies  Allergen Reactions   Firvanq [Vancomycin] Itching    OBJECTIVE: Vitals:   09/08/22 1638 09/08/22 2203 09/09/22 0606 09/09/22 0913  BP: 113/72 121/73 129/80 (!) 104/58  Pulse: 92 96 79 82  Resp: 16   17  Temp: 98.9 F (37.2 C) 99.2 F (37.3 C) 98.2 F (36.8 C) 98.6 F (37 C)  TempSrc: Oral Oral Oral Oral  SpO2: 99% 97% 98% 99%  Weight:      Height:       Body mass index is 23.87 kg/m.  Physical Exam Constitutional:  General: He is not in acute distress.    Appearance: He is normal weight. He is not toxic-appearing.  HENT:     Head: Normocephalic and atraumatic.     Right Ear: External ear normal.     Left Ear: External ear normal.     Nose: No congestion or rhinorrhea.     Mouth/Throat:     Mouth: Mucous membranes are moist.     Pharynx: Oropharynx is clear.  Eyes:     Extraocular Movements: Extraocular movements intact.     Conjunctiva/sclera: Conjunctivae normal.     Pupils: Pupils are equal, round, and reactive to light.  Cardiovascular:     Rate and Rhythm: Normal rate and regular rhythm.     Heart  sounds: No murmur heard.    No friction rub. No gallop.  Pulmonary:     Effort: Pulmonary effort is normal.     Breath sounds: Normal breath sounds.  Abdominal:     General: Abdomen is flat. Bowel sounds are normal.     Palpations: Abdomen is soft.  Musculoskeletal:        General: No swelling.     Cervical back: Normal range of motion and neck supple.  Skin:    General: Skin is warm and dry.  Neurological:     General: No focal deficit present.     Mental Status: He is oriented to person, place, and time.  Psychiatric:        Mood and Affect: Mood normal.       Lab Results Lab Results  Component Value Date   WBC 9.3 09/09/2022   HGB 8.0 (L) 09/09/2022   HCT 27.0 (L) 09/09/2022   MCV 80.1 09/09/2022   PLT 556 (H) 09/09/2022    Lab Results  Component Value Date   CREATININE 0.55 (L) 09/09/2022   BUN 13 09/09/2022   NA 136 09/09/2022   K 3.9 09/09/2022   CL 96 (L) 09/09/2022   CO2 27 09/09/2022    Lab Results  Component Value Date   ALT 32 09/09/2022   AST 15 09/09/2022   ALKPHOS 136 (H) 09/09/2022   BILITOT 0.2 (L) 09/09/2022        Danelle Earthly, MD [facility] 09/09/2022, 2:10 PM  I have personally spent 55 minutes involved in face-to-face and non-face-to-face activities for this patient on the day of the visit. Professional time spent includes the following activities: Preparing to see the patient (review of tests), Obtaining and/or reviewing separately obtained history (admission/discharge record), Performing a medically appropriate examination and/or evaluation , Ordering medications/tests/procedures, referring and communicating with other health care professionals, Documenting clinical information in the EMR, Independently interpreting results (not separately reported), Communicating results to the patient/family/caregiver, Counseling and educating the patient/family/caregiver and Care coordination (not  separately reported).

## 2022-09-09 NOTE — Progress Notes (Signed)
Daily Progress Note Intern Pager: [phone]  Patient name: Anthony [name] Medical record number: [id number] Date of birth: 1970/07/19 Age: 52 y.o. Gender: male  Primary Care Provider: Erick Alley, DO Consultants: Ortho (signed off), ID Code Status: FULL  Pt Overview and Major Events to Date:  8/18 - Admitted, ID and Ortho consulted; started linezolid, meropenem, fluconazole 8/19 - SPC replaced, urine cultures drawn  Assessment and Plan: Anthony [name] is a 52 y.o. male with a pertinent PMH of paraplegia 2/2 spina bifida and chronic osteomyelitis who presented with hypotension and recurrent fevers and was admitted for sepsis of unknown origin, currently receiving Abx and working on source control.  Still unclear etiology for infection, but of note does have dirty UA, will await urine culture and also considering drug fever given persistent eosinophilia.  No fevers since admission, feel reassured by stability. Assessment & Plan Febrile illness VSS and appears well.  Still afebrile since admission, no leukocytosis. -ID on board, appreciate recs and assistance with source control -Blood cultures NG@3d , wound culture w/ multiple organisms, PICC line culture NG<24 hrs -Replacing [facility] 8/19, consider removing PICC line if continues to fever on antibiotics -Trend CBC and fever curve daily, still afebrile -Currently holding antipyretics to see if patient fevers, can restart antipyretics if febrile -Appreciate WOC recs -Consider removing PICC and going with all oral meds (linezolid, cipro, fluconazole) per ID -Per ID, broadened antibiotic regimen below, may be able to transition to orals -Continue outpatient fluconazole 400 mg daily (8/8-) -Meropenem per pharmacy dosing (8/17-) -Linezolid 600 mg Q12 hours (8/17-) -Trend transaminases x3 Suprapubic catheter (HCC) Last changed 8/20, possible cystitis or ureteritis noted on admission CT, good UOP.  UA with moderate Hgb, protein 30, large  leukocytes, few bacteria, Ca oxalate crystals.  Possible UTI, will await urine culture. -Strict I&Os -f/u Urine Cx Anemia of infection and chronic disease Patient required 5 units of PRBCs previous admission, most recent transfusion 8/9.  Hemoglobin currently stable at 8, suspect downtrend d/t large amount of labs being drawn.  Requires transfusion with blood compatible with anti-E antibodies, which may need to be ordered from [facility]. -Continue to trend CBC daily -Transfusion threshold Hgb<7 -Labs in Peds tubes, cannot due lab holiday due to need to trend CBC and transaminases Thrombophilia (HCC) Noted to have consistent thrombophilia this admission and most of previous admission on chart review.  This is an acute phase reactant and some elevation is expected, but will CTM. 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 Diverting colostomy 7/23 for infection prevention.  Ostomy well cared for, draining stool. - Bowel regimen: Senna 8.6 mg daily, Psyllium 1 packet daily, and Lactulose 10 g BID Chronic osteomyelitis of pelvic region [facility]) No surgical intervention recommended per Ortho, stable since 7/13 on MRI hips. Acute deep vein thrombosis (DVT) of axillary vein of right upper extremity (HCC) DVT of right upper extremity noted on 7/12, asymptomatic today without shortness of breath or chest pain. -On Eliquis 5 mg twice daily (7/12 - 10/12) Sepsis (HCC) (Resolved: 09/09/2022) No longer meeting sepsis criteria, resolved.  Chronic and Stable Problems: PVCs: Flecainide 50 mg BID, metoprolol 50 mg HLD: rosuvastatin 40 mg - hold due to interaction with antibiotic  FEN/GI: Regular diet, tolerating p.o. intake, PICC line and IV in place VTE Prophylaxis: Eliquis 5 mg twice daily Dispo: SNF pending Abx course and source control. Barriers include infectious workup, source control.  Subjective:  This morning, patient says that he feels "fine."  Denies fevers, chills.  Has been  eating well.  Feels good and states that  he prefers the hospital to the SNF he was at.  States that his suprapubic catheter has been draining well and he feels that it is in place correctly.  Denies any abdominal pain.  Pain is well-controlled overall.  Objective: Temp:  [98.2 F (36.8 C)-99.2 F (37.3 C)] 98.2 F (36.8 C) (08/20 0606) Pulse Rate:  [79-96] 79 (08/20 0606) Resp:  [16-19] 16 (08/19 1638) BP: (113-131)/(57-80) 129/80 (08/20 0606) SpO2:  [97 %-100 %] 98 % (08/20 0606)  Physical Exam: General: Adult male, deconditioned, resting comfortably in bed, NAD, alert and at baseline. Cardiovascular: Regular rate and rhythm. Normal S1/S2. No murmurs, rubs, or gallops appreciated. 2+ radial pulses. Pulmonary: Clear bilaterally to ascultation. No increased WOB, no accessory muscle usage on room air. No wheezes, rales, or crackles. Abdominal: Suprapubic catheter in place, no erythema or edema, draining urine.  Ostomy draining liquid stool, some granulation tissue visible.  No tenderness to deep or light palpation. No rebound or guarding. Skin: Wounds on sacrum and bilateral hips packed and dressed.  R hip wound draining some serosanguinous fluid. Extremities: No peripheral edema bilaterally. Some amputated toes. 2+ PT pulses.  Laboratory: Most recent CBC Lab Results  Component Value Date   WBC 9.3 09/09/2022   HGB 8.0 (L) 09/09/2022   HCT 27.0 (L) 09/09/2022   MCV 80.1 09/09/2022   PLT 556 (H) 09/09/2022   Most recent BMP    Latest Ref Rng & Units 09/09/2022    3:29 AM  BMP  Glucose 70 - 99 mg/dL 161   BUN 6 - 20 mg/dL 13   Creatinine 0.96 - 1.24 mg/dL 0.45   Sodium 409 - 811 mmol/L 136   Potassium 3.5 - 5.1 mmol/L 3.9   Chloride 98 - 111 mmol/L 96   CO2 22 - 32 mmol/L 27   Calcium 8.9 - 10.3 mg/dL 9.3     Other pertinent labs: - UDS: Moderate Hgb, protein 30, large leukocytes, few bacteria, Ca oxalate crystals - Wound surgical culture: No predominant organism, multiple present @ 5 days - Blood Cx: NGTD @ 3  days - Urine culture: Pending  New Imaging/Diagnostic Tests: - None  Anthony Grewe, MD 09/09/2022, 8:02 AM [facility]  FMTS Intern pager: [phone], text pages welcome Secure chat group [facility] Teaching Service

## 2022-09-09 NOTE — Plan of Care (Signed)
  Problem: Skin Integrity: Goal: Demonstration of wound healing without infection will improve Outcome: Progressing   Problem: Education: Goal: Knowledge of the prescribed therapeutic regimen will improve Outcome: Progressing

## 2022-09-09 NOTE — Assessment & Plan Note (Addendum)
Last changed 8/20, possible cystitis or ureteritis noted on admission CT, good UOP.  UA with moderate Hgb, protein 30, large leukocytes, few bacteria, Ca oxalate crystals.  Possible UTI, will await urine culture. -Strict I&Os -f/u Urine Cx

## 2022-09-09 NOTE — Assessment & Plan Note (Signed)
No surgical intervention recommended per Ortho, stable since 7/13 on MRI hips.

## 2022-09-09 NOTE — Assessment & Plan Note (Signed)
Diverting colostomy 7/23 for infection prevention.  Ostomy well cared for, draining stool. - Bowel regimen: Senna 8.6 mg daily, Psyllium 1 packet daily, and Lactulose 10 g BID

## 2022-09-09 NOTE — TOC Initial Note (Addendum)
Transition of Care (TOC) - Initial/Assessment Note   Spoke to patient at bedside.   Patient was at [facility] for rehab prior to admission and would like to return at discharge.   Prior to [facility] he is from home alone, confirmed face sheet information. Has a wheelchair and PCP is Erick Alley   [facility] Team will continue to follow.   [place] agreeable for patient to return at discharge. Authorization started   Patient Details  Name: Anthony [name] MRN: [id number] Date of Birth: 1970/05/02  Transition of Care [facility]) CM/SW Contact:    Kingsley Plan, RN Phone Number: 09/09/2022, 11:22 AM  Clinical Narrative:                   Expected Discharge Plan: Skilled Nursing Facility Barriers to Discharge: Continued Medical Work up   Patient Goals and CMS Choice Patient states their goals for this hospitalization and ongoing recovery are:: to return to rehab CMS Medicare.gov Compare Post Acute Care list provided to:: Patient Choice offered to / list presented to : Patient [place] ownership interest in [facility].provided to:: Patient    Expected Discharge Plan and Services In-house Referral: Clinical Social Work Discharge Planning Services: Other - See comment (SW SNF) Post Acute Care Choice: Skilled Nursing Facility Living arrangements for the past 2 months: Apartment                 DME Arranged: N/A DME Agency: NA       HH Arranged: NA          Prior Living Arrangements/Services Living arrangements for the past 2 months: Apartment Lives with:: Self Patient language and need for interpreter reviewed:: Yes Do you feel safe going back to the place where you live?: Yes      Need for Family Participation in Patient Care:  (plan for rehab)   Current home services: DME Criminal Activity/Legal Involvement Pertinent to Current Situation/Hospitalization: No - Comment as needed  Activities of Daily Living Home Assistive Devices/Equipment:  Wheelchair ADL Screening (condition at time of admission) Patient's cognitive ability adequate to safely complete daily activities?: Yes Is the patient deaf or have difficulty hearing?: No Does the patient have difficulty seeing, even when wearing glasses/contacts?: No Does the patient have difficulty concentrating, remembering, or making decisions?: No Patient able to express need for assistance with ADLs?: Yes Does the patient have difficulty dressing or bathing?: Yes Independently performs ADLs?: No Communication: Independent Dressing (OT): Needs assistance Is this a change from baseline?: Pre-admission baseline Grooming: Needs assistance Is this a change from baseline?: Pre-admission baseline Feeding: Independent Bathing: Needs assistance Is this a change from baseline?: Pre-admission baseline Toileting: Needs assistance Is this a change from baseline?: Pre-admission baseline In/Out Bed: Needs assistance Is this a change from baseline?: Pre-admission baseline Walks in Home: Dependent Is this a change from baseline?: Pre-admission baseline Does the patient have difficulty walking or climbing stairs?: Yes Weakness of Legs: Both Weakness of Arms/Hands: Right  Permission Sought/Granted   Permission granted to share information with : No              Emotional Assessment Appearance:: Appears stated age Attitude/Demeanor/Rapport: Engaged Affect (typically observed): Accepting Orientation: : Oriented to Self, Oriented to Place, Oriented to  Time, Oriented to Situation Alcohol / Substance Use: Not Applicable Psych Involvement: No (comment)  Admission diagnosis:  Anemia of infection and chronic disease [B99.9, D63.8] Suprapubic catheter (HCC) [Z93.59] Chronic osteomyelitis (HCC) [M86.60] Sepsis (HCC) [A41.9] History of creation  of ostomy [facility]) [Z93.9] Decubitus ulcer due to spina bifida (HCC) [L89.90, Q05.9] Acute deep vein thrombosis (DVT) of axillary vein of right upper  extremity (HCC) [facility].A11] Acute sepsis [facility]) [A41.9] Patient Active Problem List   Diagnosis Date Noted   Thrombophilia (HCC) 09/08/2022   Advanced Directives in ACP section 09/07/2022   Polymicrobial bacterial infection 08/28/2022   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 08/16/2022   Pressure injury, stage 4, with infection (HCC) 08/05/2022   Acute deep vein thrombosis (DVT) of axillary vein of right upper extremity (HCC) 08/02/2022   Anemia of infection and chronic disease 07/26/2022   Acute osteomyelitis of pelvic region and thigh (HCC) 07/26/2022   Chronic osteomyelitis of pelvic region (HCC) 07/26/2022   Febrile illness 02/27/2022   Pyogenic arthritis of left hip (HCC)    Septic arthritis of bilateral hips (HCC) 09/03/2021   Neurogenic bladder 03/06/2017   Suprapubic catheter (HCC) 03/06/2017   Paraplegia (HCC) 01/29/2017   Decubitus ulcer due to spina bifida (HCC) 01/27/2017   Hyperlipidemia 03/11/2014   Spina bifida of lumbar region (HCC) 09/30/2012   Acute Right shoulder pain 01/27/2011   Allergic rhinitis 06/01/2007   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   VENOUS INSUFFICIENCY, CHRONIC 03/19/2006   PCP:  Erick Alley, DO Pharmacy:   [id number] - [place] - [address] [place] ([place]) [place] [address] Phone: [phone] Fax: [phone]     Social Determinants of Health (SDOH) Social History: SDOH Screenings   Food Insecurity: No Food Insecurity (09/08/2022)  Housing: Low Risk  (09/08/2022)  Transportation Needs: No Transportation Needs (09/08/2022)  Utilities: Not At Risk (09/08/2022)  Alcohol Screen: Low Risk  (01/23/2022)  Depression (PHQ2-9): Low Risk  (02/27/2022)  Financial Resource Strain: Medium Risk (01/23/2022)  Physical Activity: Inactive (01/23/2022)  Social Connections: Socially Isolated (01/23/2022)  Stress: Stress Concern Present (01/23/2022)  Tobacco Use: Medium Risk (09/08/2022)   SDOH Interventions:     Readmission  Risk Interventions     No data to display

## 2022-09-09 NOTE — Progress Notes (Signed)
Physical Therapy Treatment Patient Details Name: Anthony [name] MRN: [id number] DOB: 1970-07-06 Today's Date: 09/09/2022   History of Present Illness Pt is a 52 y.o. male admitted from SNF rehab on 09/06/22 with fevers, wound drainage. Differential dxs including sespsis vs sequelae of chronic osteomyelitis; possible sources of infection including cystitis, urethritis, PICC line hematogenous, chronic osteomyelitis/septic arthritis. Of note, recent prolonged hospitalization (07/25/22-09/01/22 with d/c to SNF rehab) for polymicrobial bacteremia secondary to sacral wound. Other PMH includes paraplegia secondary to spina bifida, ostomy, chronic suprapubic catheter, chronic wounds (sacrum, ischial tuberosities, buttocks), chronic osteomyelitis, HTN.   PT Comments  Pt progressing with mobility. Today's session focused on seated EOB RUE therex/AROM; pt mod indep with bed mobility, heavy use of bed rail. Continue to recommend return to post-acute rehab to maximize functional mobility and independence prior to return home. Will follow acutely to address established goals.     If plan is discharge home, recommend the following: A lot of help with walking and/or transfers;A lot of help with bathing/dressing/bathroom;Assistance with cooking/housework;Assist for transportation;Help with stairs or ramp for entrance   Can travel by private vehicle     No  Equipment Recommendations  Other (comment) (defer to next venue)    Recommendations for Other Services       Precautions / Restrictions Precautions Precautions: Fall;Other (comment) Precaution Comments: h/o paraplegia secondary to spina bifida; suprapubic catheter, colostomy; bilateral hip wounds, R heel wound     Mobility  Bed Mobility Overal bed mobility: Modified Independent Bed Mobility: Rolling, Sidelying to Sit, Sit to Sidelying Rolling: Modified independent (Device/Increase time), Used rails Sidelying to sit: Modified independent  (Device/Increase time), Used rails, HOB elevated     Sit to sidelying: Modified independent (Device/Increase time), Used rails General bed mobility comments: increased time and effort, pt mod indep rolling and sidelying<>sit with heavy bed rail use, using BUEs to assist BLEs to EOB    Transfers                   General transfer comment: pt declined wanting to focus on R shoulder this session    Ambulation/Gait                   Stairs             Wheelchair Mobility     Tilt Bed    Modified Rankin (Stroke Patients Only)       Balance Overall balance assessment: Needs assistance Sitting-balance support: No upper extremity supported, Single extremity supported, Feet unsupported Sitting balance-Leahy Scale: Fair Sitting balance - Comments: able to maintain prolonged sitting EOB balance with intermittent LUE support                                    Cognition Arousal: Alert Behavior During Therapy: WFL for tasks assessed/performed Overall Cognitive Status: Within Functional Limits for tasks assessed                                          Exercises General Exercises - Upper Extremity Shoulder Flexion: AAROM, PROM, Right, Seated Shoulder ABduction: AAROM, PROM, Right, Seated Shoulder Horizontal ABduction: AROM, PROM, Right, Seated Shoulder Horizontal ADduction: AROM, PROM, Right, Seated Elbow Flexion: AROM, Right, Seated Elbow Extension: AROM, Left, Seated General Exercises - Lower Extremity Long Arc Quad: PROM, Both,  Seated    General Comments General comments (skin integrity, edema, etc.): R heel wound dressing came off during session, RN notified; heels elevated off bed surface at end of session      Pertinent Vitals/Pain Pain Assessment Pain Assessment: Faces Faces Pain Scale: Hurts little more Pain Location: R shoulder with ROM Pain Descriptors / Indicators: Discomfort, Guarding Pain  Intervention(s): Monitored during session, Repositioned    Home Living                          Prior Function            PT Goals (current goals can now be found in the care plan section) Progress towards PT goals: Progressing toward goals    Frequency    Min 1X/week      PT Plan Current plan remains appropriate    Co-evaluation              AM-PAC PT "6 Clicks" Mobility   Outcome Measure  Help needed turning from your back to your side while in a flat bed without using bedrails?: None Help needed moving from lying on your back to sitting on the side of a flat bed without using bedrails?: A Little Help needed moving to and from a bed to a chair (including a wheelchair)?: A Little Help needed standing up from a chair using your arms (e.g., wheelchair or bedside chair)?: Total Help needed to walk in hospital room?: Total Help needed climbing 3-5 steps with a railing? : Total 6 Click Score: 13    End of Session   Activity Tolerance: Patient tolerated treatment well Patient left: in bed;with call bell/phone within reach;with nursing/sitter in room Nurse Communication: Mobility status PT Visit Diagnosis: Pain;Muscle weakness (generalized) (M62.81);Other abnormalities of gait and mobility (R26.89) Pain - Right/Left: Right Pain - part of body: Shoulder     Time: 5366-4403 PT Time Calculation (min) (ACUTE ONLY): 35 min  Charges:    $Therapeutic Exercise: 8-22 mins $Therapeutic Activity: 8-22 mins PT General Charges $$ ACUTE PT VISIT: 1 Visit                     Ina Homes, PT, DPT Acute Rehabilitation Services  Personal: Secure Chat Rehab Office: [phone]  Malachy Chamber 09/09/2022, 4:28 PM

## 2022-09-09 NOTE — Assessment & Plan Note (Signed)
Noted to have consistent thrombophilia this admission and most of previous admission on chart review.  This is an acute phase reactant and some elevation is expected, but will CTM.

## 2022-09-09 NOTE — Assessment & Plan Note (Signed)
DVT of right upper extremity noted on 7/12, asymptomatic today without shortness of breath or chest pain. -On Eliquis 5 mg twice daily (7/12 - 10/12)

## 2022-09-09 NOTE — Progress Notes (Signed)
PHARMACY CONSULT NOTE FOR:  OUTPATIENT  PARENTERAL ANTIBIOTIC THERAPY (OPAT)  Informational orders as noted plans is for the patient to discharge back to SNF  Indication: Pelvic osteo Regimen:  Daptomycin 850 mg IV every 24 hours and  Ertapenem 1g IV every 24 hours and Fluconazole 400 mg po daily End date: 10/02/22  IV antibiotic discharge orders are pended. To discharging provider:  please sign these orders via discharge navigator,  Select New Orders & click on the button choice - Manage This Unsigned Work.     Thank you for allowing pharmacy to be a part of this patient's care.  Georgina Pillion, PharmD, BCPS, BCIDP Infectious Diseases Clinical Pharmacist 09/09/2022 8:09 AM   **Pharmacist phone directory can now be found on [email or web address] ([email or web address]).  Listed under [facility].

## 2022-09-09 NOTE — NC FL2 (Signed)
[place] MEDICAID FL2 LEVEL OF CARE FORM     IDENTIFICATION  Patient Name: Anthony [name] Birthdate: [birth year] Sex: male Admission Date (Current Location): 09/06/2022  [facility] and [place] Number:  Producer, television/film/video and Address:  [facility], [address] [place], [place] [address]      Provider Number: [id number]  Attending Physician Name and Address:  McDiarmid, Leighton Roach, MD  Relative Name and Phone Number:       Current Level of Care: Hospital Recommended Level of Care: Skilled Nursing Facility Prior Approval Number:    Date Approved/Denied:   PASRR Number: [id number]  Discharge Plan: SNF    Current Diagnoses: Patient Active Problem List   Diagnosis Date Noted   Thrombophilia (HCC) 09/08/2022   Advanced Directives in ACP section 09/07/2022   Polymicrobial bacterial infection 08/28/2022   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 08/16/2022   Pressure injury, stage 4, with infection (HCC) 08/05/2022   Acute deep vein thrombosis (DVT) of axillary vein of right upper extremity (HCC) 08/02/2022   Anemia of infection and chronic disease 07/26/2022   Acute osteomyelitis of pelvic region and thigh (HCC) 07/26/2022   Chronic osteomyelitis of pelvic region (HCC) 07/26/2022   Febrile illness 02/27/2022   Pyogenic arthritis of left hip (HCC)    Septic arthritis of bilateral hips (HCC) 09/03/2021   Neurogenic bladder 03/06/2017   Suprapubic catheter (HCC) 03/06/2017   Paraplegia (HCC) 01/29/2017   Decubitus ulcer due to spina bifida (HCC) 01/27/2017   Hyperlipidemia 03/11/2014   Spina bifida of lumbar region (HCC) 09/30/2012   Acute Right shoulder pain 01/27/2011   Allergic rhinitis 06/01/2007   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   VENOUS INSUFFICIENCY, CHRONIC 03/19/2006    Orientation RESPIRATION BLADDER Height & Weight     Self, Time, Situation, Place  Normal Indwelling catheter (Suprapubic catheter) Weight: 157 lb (71.2 kg) Height:  5\' 8"  (172.7 cm)   BEHAVIORAL SYMPTOMS/MOOD NEUROLOGICAL BOWEL NUTRITION STATUS      Colostomy Diet (See DC summary)  AMBULATORY STATUS COMMUNICATION OF NEEDS Skin   Total Care Verbally PU Stage and Appropriate Care (R heel, hip, and Buttock PI. L thigh Incision. Sacrum PI)                       Personal Care Assistance Level of Assistance  Bathing, Feeding, Dressing Bathing Assistance: Limited assistance Feeding assistance: Independent Dressing Assistance: Limited assistance     Functional Limitations Info  Sight, Hearing, Speech Sight Info: Adequate Hearing Info: Adequate Speech Info: Adequate    SPECIAL CARE FACTORS FREQUENCY  PT (By licensed PT), OT (By licensed OT)     PT Frequency: 5x week OT Frequency: 5x week            Contractures Contractures Info: Not present    Additional Factors Info  Code Status, Allergies Code Status Info: Full Allergies Info: Firvanq (Vancomyacin)           Current Medications (09/09/2022):  This is the current hospital active medication list Current Facility-Administered Medications  Medication Dose Route Frequency Provider Last Rate Last Admin   0.9 %  sodium chloride infusion   Intravenous Continuous McDiarmid, Leighton Roach, MD 10 mL/hr at 09/09/22 1316 New Bag at 09/09/22 1316   apixaban (ELIQUIS) tablet 5 mg  5 mg Oral BID Shitarev, Dimitry, MD   5 mg at 09/09/22 1048   ascorbic acid (VITAMIN C) tablet 500 mg  500 mg Oral BID Shitarev, Dimitry, MD  500 mg at 09/09/22 1049   Chlorhexidine Gluconate Cloth 2 % PADS 6 each  6 each Topical Daily McDiarmid, Leighton Roach, MD   6 each at 09/09/22 1049   feeding supplement (ENSURE ENLIVE / ENSURE PLUS) liquid 237 mL  237 mL Oral TID BM Shitarev, Dimitry, MD   237 mL at 09/09/22 1049   flecainide (TAMBOCOR) tablet 50 mg  50 mg Oral BID Shitarev, Dimitry, MD   50 mg at 09/09/22 1049   fluconazole (DIFLUCAN) tablet 400 mg  400 mg Oral Daily Shitarev, Dimitry, MD   400 mg at 09/09/22 1048   gabapentin (NEURONTIN)  capsule 100 mg  100 mg Oral TID Sharion Dove, Dimitry, MD   100 mg at 09/09/22 1048   hydrocerin (EUCERIN) cream   Topical Daily McDiarmid, Leighton Roach, MD   Given at 09/09/22 1049   hydrOXYzine (ATARAX) tablet 25 mg  25 mg Oral TID PRN Alicia Amel, MD   25 mg at 09/08/22 2224   lactulose (CHRONULAC) 10 GM/15ML solution 10 g  10 g Oral BID Shitarev, Dimitry, MD   10 g at 09/09/22 1049   linezolid (ZYVOX) IVPB 600 mg  600 mg Intravenous Q12H Shitarev, Dimitry, MD 300 mL/hr at 09/09/22 1159 600 mg at 09/09/22 1159   loratadine (CLARITIN) tablet 10 mg  10 mg Oral Daily Shitarev, Dimitry, MD   10 mg at 09/09/22 1048   LORazepam (ATIVAN) 2 MG/ML concentrated solution 0.5 mg  0.5 mg Oral Once PRN Shelby Mattocks, DO       meropenem (MERREM) 1 g in sodium chloride 0.9 % 100 mL IVPB  1 g Intravenous Q8H Dang, Thuy D, RPH 200 mL/hr at 09/09/22 1056 1 g at 09/09/22 1056   methocarbamol (ROBAXIN) tablet 1,000 mg  1,000 mg Oral Q8H Shitarev, Dimitry, MD   1,000 mg at 09/09/22 0960   metoprolol succinate (TOPROL-XL) 24 hr tablet 50 mg  50 mg Oral BID Shitarev, Dimitry, MD   50 mg at 09/09/22 1048   multivitamin with minerals tablet 1 tablet  1 tablet Oral Daily Shitarev, Dimitry, MD   1 tablet at 09/09/22 1049   nitroGLYCERIN (NITROSTAT) SL tablet 0.4 mg  0.4 mg Sublingual Q5 min PRN Shitarev, Dimitry, MD       omega-3 acid ethyl esters (LOVAZA) capsule 1 g  1 g Oral Daily Shitarev, Dimitry, MD   1 g at 09/09/22 1049   psyllium (HYDROCIL/METAMUCIL) 1 packet  1 packet Oral Daily Shitarev, Dimitry, MD   1 packet at 09/09/22 1050   rosuvastatin (CRESTOR) tablet 40 mg  40 mg Oral QHS Shitarev, Dimitry, MD   40 mg at 09/08/22 2215   senna-docusate (Senokot-S) tablet 1 tablet  1 tablet Oral QHS Shitarev, Dimitry, MD   1 tablet at 09/08/22 2214   sodium chloride flush (NS) 0.9 % injection 10-40 mL  10-40 mL Intracatheter PRN McDiarmid, Leighton Roach, MD       zinc sulfate capsule 220 mg  220 mg Oral Daily Shitarev, Dimitry, MD    220 mg at 09/09/22 1049     Discharge Medications: Please see discharge summary for a list of discharge medications.  Relevant Imaging Results:  Relevant Lab Results:   Additional Information SS# [id number]  Carley Hammed, LCSW

## 2022-09-09 NOTE — Assessment & Plan Note (Signed)
Patient required 5 units of PRBCs previous admission, most recent transfusion 8/9.  Hemoglobin currently stable at 8, suspect downtrend d/t large amount of labs being drawn.  Requires transfusion with blood compatible with anti-E antibodies, which may need to be ordered from [facility]. -Continue to trend CBC daily -Transfusion threshold Hgb<7 -Labs in Peds tubes, cannot due lab holiday due to need to trend CBC and transaminases

## 2022-09-09 NOTE — Assessment & Plan Note (Addendum)
VSS and appears well.  Still afebrile since admission, no leukocytosis. -ID on board, appreciate recs and assistance with source control -Blood cultures NG@3d , wound culture w/ multiple organisms, PICC line culture NG<24 hrs -Replacing [facility] 8/19, consider removing PICC line if continues to fever on antibiotics -Trend CBC and fever curve daily, still afebrile -Currently holding antipyretics to see if patient fevers, can restart antipyretics if febrile -Appreciate WOC recs -Consider removing PICC and going with all oral meds (linezolid, cipro, fluconazole) per ID -Per ID, broadened antibiotic regimen below, may be able to transition to orals -Continue outpatient fluconazole 400 mg daily (8/8-) -Meropenem per pharmacy dosing (8/17-) -Linezolid 600 mg Q12 hours (8/17-) -Trend transaminases x3

## 2022-09-10 ENCOUNTER — Encounter: Payer: Medicare HMO | Admitting: Family

## 2022-09-10 DIAGNOSIS — R509 Fever, unspecified: Secondary | ICD-10-CM | POA: Diagnosis not present

## 2022-09-10 DIAGNOSIS — M86659 Other chronic osteomyelitis, unspecified thigh: Secondary | ICD-10-CM | POA: Diagnosis not present

## 2022-09-10 LAB — CBC WITH DIFFERENTIAL/PLATELET
Abs Immature Granulocytes: 0.06 10*3/uL (ref 0.00–0.07)
Basophils Absolute: 0.2 10*3/uL — ABNORMAL HIGH (ref 0.0–0.1)
Basophils Relative: 2 %
Eosinophils Absolute: 1.1 10*3/uL — ABNORMAL HIGH (ref 0.0–0.5)
Eosinophils Relative: 11 %
HCT: 27.3 % — ABNORMAL LOW (ref 39.0–52.0)
Hemoglobin: 7.9 g/dL — ABNORMAL LOW (ref 13.0–17.0)
Immature Granulocytes: 1 %
Lymphocytes Relative: 19 %
Lymphs Abs: 2 10*3/uL (ref 0.7–4.0)
MCH: 23 pg — ABNORMAL LOW (ref 26.0–34.0)
MCHC: 28.9 g/dL — ABNORMAL LOW (ref 30.0–36.0)
MCV: 79.4 fL — ABNORMAL LOW (ref 80.0–100.0)
Monocytes Absolute: 1.3 10*3/uL — ABNORMAL HIGH (ref 0.1–1.0)
Monocytes Relative: 13 %
Neutro Abs: 5.6 10*3/uL (ref 1.7–7.7)
Neutrophils Relative %: 54 %
Platelets: 560 10*3/uL — ABNORMAL HIGH (ref 150–400)
RBC: 3.44 MIL/uL — ABNORMAL LOW (ref 4.22–5.81)
RDW: 19.9 % — ABNORMAL HIGH (ref 11.5–15.5)
WBC: 10.3 10*3/uL (ref 4.0–10.5)
nRBC: 0 % (ref 0.0–0.2)

## 2022-09-10 LAB — COMPREHENSIVE METABOLIC PANEL
ALT: 27 U/L (ref 0–44)
AST: 15 U/L (ref 15–41)
Albumin: 2.1 g/dL — ABNORMAL LOW (ref 3.5–5.0)
Alkaline Phosphatase: 134 U/L — ABNORMAL HIGH (ref 38–126)
Anion gap: 11 (ref 5–15)
BUN: 14 mg/dL (ref 6–20)
CO2: 28 mmol/L (ref 22–32)
Calcium: 9.6 mg/dL (ref 8.9–10.3)
Chloride: 97 mmol/L — ABNORMAL LOW (ref 98–111)
Creatinine, Ser: 0.53 mg/dL — ABNORMAL LOW (ref 0.61–1.24)
GFR, Estimated: >60 mL/min (ref >60)
Glucose, Bld: 103 mg/dL — ABNORMAL HIGH (ref 70–99)
Potassium: 4.2 mmol/L (ref 3.5–5.1)
Sodium: 136 mmol/L (ref 135–145)
Total Bilirubin: 0.4 mg/dL (ref 0.3–1.2)
Total Protein: 7.3 g/dL (ref 6.5–8.1)

## 2022-09-10 LAB — CK: Total CK: 8 U/L — ABNORMAL LOW (ref 49–397)

## 2022-09-10 MED ORDER — ACETAMINOPHEN 325 MG PO TABS
650.0000 mg | ORAL_TABLET | Freq: Four times a day (QID) | ORAL | Status: DC | PRN
  Administered 2022-09-10: 650 mg via ORAL
  Filled 2022-09-10: qty 2

## 2022-09-10 NOTE — Assessment & Plan Note (Signed)
Last changed 8/20, possible cystitis or ureteritis noted on admission CT, good UOP.  Urine culture unremarkable. -Strict I&Os

## 2022-09-10 NOTE — Discharge Summary (Incomplete)
Family Medicine Teaching [facility] Discharge Summary  Patient name: Anthony [name] Medical record number: [id number] Date of birth: 07-05-1970 Age: 52 y.o. Gender: male Date of Admission: 09/06/2022  Date of Discharge: 09/11/2022 Admitting Physician: Anthony Shi, MD  Primary Care Provider: Erick Alley, DO Consultants: Infectious Disease  Indication for Hospitalization: Sepsis  Discharge Diagnoses/Problem List:  Principal Problem for Admission: Febrile illness Other Problems addressed during stay:  Principal Problem:   Febrile illness Active Problems:   Anemia of infection and chronic disease   Chronic osteomyelitis of pelvic region [facility])   Suprapubic catheter (HCC)   Acute deep vein thrombosis (DVT) of axillary vein of right upper extremity (HCC)   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   Thrombophilia [facility])  Brief Hospital Course:  Anthony [name] is a 52 y.o.male with a history of spina bifida with paraplegia and chronic osteomyelitis 2/2 sacral ulcers who was admitted to the [facility] Medicine Teaching Service at [facility] for acute sepsis. His hospital course is detailed below:  Febrile illness Pt presented to ED with fever, leukocytosis, generalized weakness, and hypotension initially admitted for sepsis. Pt was recently hospitalized 7/5-8/12 for sepsis 2/2 osteomyelitis of chronic wounds, on multiple antibiotics. Pt was discharged to SNF on 8/12 with daptomycin and fluconazole. Stated that he did not believe his wound dressings were being changed enough and that he did not receive some doses of his antibiotics/antifungals at his SNF. ED started patient on Zosyn.  ID was consulted and recommended meropenem and linezolid instead of previous Abx. Orthopedic Surgery was also consulted, but after MR hip showed unchanged osteomyelitis from 7/13 and Ortho evaluated patient, they signed off with no surgical recommendations at this time.  Leukocytosis resolved and patient with stable vital signs,  sepsis resolved.  Per ID recs, suprapubic catheter was exchanged on 8/19.  PICC line was kept and patient switched to daptomycin from linezolid.  Patient fevered on day 2 of daptomycin with increased eosinophilia and switched back to linezolid to complete course until 9/12, fever thought to be drug reaction.  Outpatient appointment set up with ID on 9/10 and patient discharged back to SNF.  Antibacterial course: Daptomycin (7/29-8/18), fluconazole (8/8-9/12) when coming from SNF Meropenem (8/18-8/20), linezolid (8/18-8/20), Zosyn (8/18), broadened Abx on admission Daptomycin (8/20-8/22), stopped after refevered Linezolid (8/22-9/12), switched from dapto after fever  Acute deep vein thrombosis (DVT) of axillary vein of right upper extremity DVT of right upper extremity noted on 7/12, remained asymptomatic and continued on Eliquis 5 mg twice daily (7/12 - 10/12).  Anemia of infection and chronic disease Patient required 5 units of PRBCs previous admission, most recent transfusion 8/9.  Hemoglobin downtrended from 9.5 on admission to ~8 and remained stable when team began ordering Peds tubes for labs.  Suspect downtrend iatrogenic in setting of repeat phlebotomy with labs.  Did not require transfusion this admission.  Other chronic conditions were medically managed with home medications and formulary alternatives as necessary (PVCs, HLD).  PCP/SNF Follow-up Recommendations: Held patient's rosuvastatin 40 mg when starting daptomycin due to rhabdo risk, will need to resume when finishes dapto course on 9/12. Has ID follow up outpatient on 9/10. Please pull PICC on completion of IV antibiotics on 9/12.  Per pharmacy protocol, linezolid 600 mg PO BID, ertapenem 1 g IV daily, and fluconzaole 200 mg PO BID until 9/12. Needs weekly CBC w/ diff, CMP, CRP, ESR while on his current IV antibiotics per ID (next 8/29). Follow up eosinophilia on repeat CBC w/ diff.  Disposition:  SNF  Discharge Condition:  Stable, afebrile, on IV antibiotics with PICC line  Discharge Exam:  Vitals:   09/11/22 0450 09/11/22 0925  BP: 134/68 118/68  Pulse: 89 88  Resp: 17 17  Temp: 98.7 F (37.1 C) 98.3 F (36.8 C)  SpO2: 96% 100%   Physical Exam: General: Adult male, paraplegic, resting in bed Eyes: No injections, erythema, or scleral icterus. ENTM: MMM. Neck: Supple, no LAD. Cardiovascular: Regular rate and rhythm.  Normal S1/S2.  No murmurs/rubs/gallops. Respiratory: Normal WOB on room air.  CTAB. Gastrointestinal: Ostomy draining liquid stool, granulation tissue visible at opening. MSK: Moving upper extremities appropriately, paraplegia of BLE, deconditioned. Extremities: No peripheral pitting edema bilaterally.  2+ PT pulses bilaterally.  Transmetatarsal amputations bilaterally.  Cap refill less than 2 seconds. Derm: Sacral and bilateral hip wounds packed and dressed, not draining. Neuro: Alert and oriented x 4.  No focal neurological deficit. Psych: Pleasant, appropriate.  Full range affect.  Significant Procedures: None  Significant Labs and Imaging:  Recent Labs  Lab 09/10/22 0325 09/11/22 0754  WBC 10.3 9.6  HGB 7.9* 8.2*  HCT 27.3* 28.4*  PLT 560* 552*   Recent Labs  Lab 09/10/22 0325 09/11/22 0404  NA 136 139  K 4.2 4.2  CL 97* 99  CO2 28 29  GLUCOSE 103* 118*  BUN 14 18  CREATININE 0.53* 0.54*  CALCIUM 9.6 9.8  ALKPHOS 134* 134*  AST 15 19  ALT 27 28  ALBUMIN 2.1* 2.1*   - Eosinophils: 0.6>0.9>1.1>1.2  Results/Tests Pending at Time of Discharge: - Blood cultures PICC line 8/20 NGTD@3d  - Blood cultures 8/18 NGTD@5d   Discharge Medications:  Allergies as of 09/11/2022       Reactions   Firvanq [vancomycin] Itching        Medication List     STOP taking these medications    ceFEPIme 2 g in sodium chloride 0.9 % 100 mL   daptomycin IVPB Commonly known as: CUBICIN       TAKE these medications    acetaminophen 500 MG tablet Commonly known as:  TYLENOL Take 1,000 mg by mouth every 6 (six) hours as needed for moderate pain, headache or fever.   Advil 200 MG tablet Generic drug: ibuprofen Take 200 mg by mouth every 6 (six) hours as needed for headache or moderate pain.   apixaban 5 MG Tabs tablet Commonly known as: ELIQUIS Take 1 tablet (5 mg total) by mouth 2 (two) times daily.   ascorbic acid 500 MG tablet Commonly known as: VITAMIN C Take 1 tablet (500 mg total) by mouth 2 (two) times daily.   Dakins (full strength) 0.5 % Soln Apply 1 Application topically See admin instructions. Apply to right hip topically every day and evening shift for wound.   ertapenem 1 g in sodium chloride 0.9 % 100 mL Inject 1 g into the vein daily for 21 days.   ertapenem IVPB Commonly known as: INVANZ Inject 1 g into the vein daily for 21 days. Indication:  Pelvic osteo First Dose: Yes Last Day of Therapy:  10/02/22 Labs - Once weekly:  CBC/D and BMP, Labs - Once weekly: ESR and CRP Method of administration: Mini-Bag Plus / Gravity Method of administration may be changed at the discretion of home infusion pharmacist based upon assessment of the patient and/or caregiver's ability to self-administer the medication ordered.   feeding supplement Liqd Take 237 mLs by mouth 3 (three) times daily between meals.   FISH OIL PO Take 1 capsule  by mouth daily.   flecainide 50 MG tablet Commonly known as: TAMBOCOR Take 1 tablet (50 mg total) by mouth 2 (two) times daily.   fluconazole 200 MG tablet Commonly known as: DIFLUCAN Take 2 tablets (400 mg total) by mouth daily for 23 days.   Foley Catheter 2-Way Misc Use as directed by urologist.   gabapentin 100 MG capsule Commonly known as: NEURONTIN Take 1 capsule (100 mg total) by mouth 3 (three) times daily.   hydrocerin Crea Apply 1 Application topically 2 (two) times daily.   lactulose 10 GM/15ML solution Commonly known as: CHRONULAC Take 15 mLs (10 g total) by mouth 2 (two) times  daily.   lidocaine 5 % Commonly known as: LIDODERM Place 1-2 patches onto the skin daily. Remove & Discard patch within 12 hours or as directed by MD   linezolid 600 MG tablet Commonly known as: ZYVOX Take 1 tablet (600 mg total) by mouth every 12 (twelve) hours for 21 days.   lisinopril-hydrochlorothiazide 20-25 MG tablet Commonly known as: ZESTORETIC Take 1 tablet by mouth daily.   loratadine 10 MG tablet Commonly known as: CLARITIN Take 1 tablet (10 mg total) by mouth daily.   Methocarbamol 1000 MG Tabs Take 1,000 mg by mouth every 8 (eight) hours.   metoprolol succinate 50 MG 24 hr tablet Commonly known as: TOPROL-XL Take 1 tablet (50 mg total) by mouth 2 (two) times daily. Take with or immediately following a meal.   multivitamin with minerals Tabs tablet Take 1 tablet by mouth daily.   nitroGLYCERIN 0.4 MG SL tablet Commonly known as: NITROSTAT Place 1 tablet (0.4 mg total) under the tongue every 5 (five) minutes as needed for chest pain.   psyllium 95 % Pack Commonly known as: HYDROCIL/METAMUCIL Take 1 packet by mouth daily.   rosuvastatin 40 MG tablet Commonly known as: CRESTOR Take 1 tablet by mouth once daily What changed: when to take this   senna 8.6 MG Tabs tablet Commonly known as: SENOKOT Take 1 tablet (8.6 mg total) by mouth daily.   simethicone 80 MG chewable tablet Commonly known as: MYLICON Chew 1 tablet (80 mg total) by mouth every 6 (six) hours as needed for flatulence.   zinc sulfate 220 (50 Zn) MG capsule Take 1 capsule (220 mg total) by mouth daily.               Home Infusion Instuctions  (From admission, onward)           Start     Ordered   09/11/22 0000  Home infusion instructions       Question:  Instructions  Answer:  Flushing of vascular access device: 0.9% NaCl pre/post medication administration and prn patency; Heparin 100 u/ml, 5ml for implanted ports and Heparin 10u/ml, 5ml for all other central venous catheters.    09/11/22 1400              Discharge Care Instructions  (From admission, onward)           Start     Ordered   09/11/22 0000  Discharge wound care:       Comments: Daily wound care to bilateral feet:  Wash with soap and water daily taking care to clean between digits. Rinse and dry. Apply Eucerin cream to LEs and feet but do not apply between digits.   Daily wound care to sacral, hip and ischial tuberosity wounds:  Cleanse wounds and periwound skin with Vashe pure hypochlorous acid, Lawson # [phone].  Pat surrounding skin dry. Fill defects with Vashe moistened gauze, top with dry gauze, cover with ABD pads and secure with paper tape. Change daily.   Daily wound care to right heel stage 3 PI:  Cleanse with soap and water, rinse and dry. Cover with silver hydrofiber (Aquacel Ag+ Advantage, Lawson # [id number]), top with dry gauze and secure with dry gauze and silicone foam.   09/11/22 1405            Discharge Instructions: Please refer to Patient Instructions section of EMR for full details.  Patient was counseled important signs and symptoms that should prompt return to medical care, changes in medications, dietary instructions, activity restrictions, and follow up appointments.   Follow-Up Appointments: Future Appointments  Date Time Provider Department Center  09/30/2022  1:00 PM Judyann Munson, MD [facility] Tomie China, Mckenna Gamm, MD 09/11/2022, 2:23 PM PGY-1, [facility]  I was personally present and performed medical decision making activities of this service and have verified that the service and findings are accurately documented in the resident's note.  Shelby Mattocks, DO                  09/11/2022, 3:59 PM

## 2022-09-10 NOTE — TOC Progression Note (Signed)
Transition of Care [facility]) - Progression Note    Patient Details  Name: Anthony [name] MRN: [id number] Date of Birth: 04-08-1970  Transition of Care [facility]) CM/SW Contact  Carley Hammed, LCSW Phone Number: 09/10/2022, 2:55 PM  Clinical Narrative:    [name] was advised by insurance that authorization was sent to MD review for medical readiness. Per pt's medical team he is medically ready for discharge. Facility can take pt when Berkley Harvey is approved. TOC will continue to follow for DC needs.    Expected Discharge Plan: Skilled Nursing Facility Barriers to Discharge: Continued Medical Work up  Expected Discharge Plan and Services In-house Referral: Clinical Social Work Discharge Planning Services: Other - See comment (SW SNF) Post Acute Care Choice: Skilled Nursing Facility Living arrangements for the past 2 months: Apartment                 DME Arranged: N/A DME Agency: NA       HH Arranged: NA           Social Determinants of Health (SDOH) Interventions SDOH Screenings   Food Insecurity: No Food Insecurity (09/08/2022)  Housing: Low Risk  (09/08/2022)  Transportation Needs: No Transportation Needs (09/08/2022)  Utilities: Not At Risk (09/08/2022)  Alcohol Screen: Low Risk  (01/23/2022)  Depression (PHQ2-9): Low Risk  (02/27/2022)  Financial Resource Strain: Medium Risk (01/23/2022)  Physical Activity: Inactive (01/23/2022)  Social Connections: Socially Isolated (01/23/2022)  Stress: Stress Concern Present (01/23/2022)  Tobacco Use: Medium Risk (09/08/2022)    Readmission Risk Interventions     No data to display

## 2022-09-10 NOTE — Assessment & Plan Note (Addendum)
VSS and appears well.  Still afebrile since admission, no leukocytosis. -ID on board, appreciate recs and assistance with source control -Blood cultures NG@4d , PICC line culture NG@1d  -Trend CBC and fever curve daily, still afebrile -Currently holding antipyretics to see if patient fevers, can restart antipyretics if febrile -Appreciate WOC recs -Holding rosuvastatin in setting of concurrent daptomycin given risk of rhabdo -Per ID, broadened antibiotic regimen below, may be able to transition to orals -Continue outpatient fluconazole 400 mg daily (8/8-9/12) -Switch meropenem (8/17-8/20) to ertapenem (8/20-9/12) -Switch linezolid 600 mg (8/17-8/20) to daptomycin (8/20-9/12)

## 2022-09-10 NOTE — Assessment & Plan Note (Signed)
Diverting colostomy 7/23 for infection prevention.  Ostomy well cared for, draining stool. - Bowel regimen: Senna 8.6 mg daily, Psyllium 1 packet daily, and Lactulose 10 g BID

## 2022-09-10 NOTE — Assessment & Plan Note (Signed)
DVT of right upper extremity noted on 7/12, asymptomatic today without shortness of breath or chest pain. -On Eliquis 5 mg twice daily (7/12 - 10/12)

## 2022-09-10 NOTE — Assessment & Plan Note (Signed)
Patient required 5 units of PRBCs previous admission, most recent transfusion 8/9.  Hemoglobin currently stable at 8, suspect downtrend d/t large amount of labs being drawn.  Requires transfusion with blood compatible with anti-E antibodies, which may need to be ordered from [facility]. -Continue to trend CBC daily -Transfusion threshold Hgb<7 -Labs in Peds tubes, cannot due lab holiday due to need to trend CBC and transaminases

## 2022-09-10 NOTE — Assessment & Plan Note (Signed)
No surgical intervention recommended per Ortho, stable since 7/13 on MRI hips.

## 2022-09-10 NOTE — Assessment & Plan Note (Signed)
Noted to have consistent thrombophilia this admission and most of previous admission on chart review.  This is an acute phase reactant and some elevation is expected, but will CTM.

## 2022-09-10 NOTE — Progress Notes (Signed)
Daily Progress Note Intern Pager: [phone]  Patient name: Anthony [name] Medical record number: [id number] Date of birth: Nov 26, 1970 Age: 52 y.o. Gender: male  Primary Care Provider: Erick Alley, DO Consultants: Ortho (signed off), ID Code Status: FULL  Pt Overview and Major Events to Date:  8/18 - Admitted, ID and Ortho consulted; started linezolid, meropenem, fluconazole 8/19 - SPC replaced, urine cultures drawn 8/20 - Switched meropenem to ertapenem and linezolid to daptomycin   Assessment and Plan: Anthony [name] is a 52 y.o. male with a pertinent PMH of paraplegia 2/2 spina bifida and chronic osteomyelitis who presented with hypotension and recurrent fevers and was admitted for sepsis of unknown origin, currently receiving Abx and working on source control.  Urine culture with no growth.  Possible etiology of drug fevers given persistent eosinophilia, but ID notes no lung abnormality to suggest daptomycin induced eosinophilic pneumonia. Assessment & Plan Febrile illness VSS and appears well.  Still afebrile since admission, no leukocytosis. -ID on board, appreciate recs and assistance with source control -Blood cultures NG@4d , PICC line culture NG@1d  -Trend CBC and fever curve daily, still afebrile -Currently holding antipyretics to see if patient fevers, can restart antipyretics if febrile -Appreciate WOC recs -Holding rosuvastatin in setting of concurrent daptomycin given risk of rhabdo -Per ID, broadened antibiotic regimen below, may be able to transition to orals -Continue outpatient fluconazole 400 mg daily (8/8-9/12) -Switch meropenem (8/17-8/20) to ertapenem (8/20-9/12) -Switch linezolid 600 mg (8/17-8/20) to daptomycin (8/20-9/12) Suprapubic catheter (HCC) Last changed 8/20, possible cystitis or ureteritis noted on admission CT, good UOP.  Urine culture unremarkable. -Strict I&Os Anemia of infection and chronic disease Patient required 5 units of PRBCs previous  admission, most recent transfusion 8/9.  Hemoglobin currently stable at 8, suspect downtrend d/t large amount of labs being drawn.  Requires transfusion with blood compatible with anti-E antibodies, which may need to be ordered from [facility] -Continue to trend CBC daily -Transfusion threshold Hgb<7 -Labs in Peds tubes, cannot due lab holiday due to need to trend CBC and transaminases Thrombophilia (HCC) Noted to have consistent thrombophilia this admission and most of previous admission on chart review.  This is an acute phase reactant and some elevation is expected, but will CTM. 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 Diverting colostomy 7/23 for infection prevention.  Ostomy well cared for, draining stool. - Bowel regimen: Senna 8.6 mg daily, Psyllium 1 packet daily, and Lactulose 10 g BID Chronic osteomyelitis of pelvic region [facility]) No surgical intervention recommended per Ortho, stable since 7/13 on MRI hips. Acute deep vein thrombosis (DVT) of axillary vein of right upper extremity (HCC) DVT of right upper extremity noted on 7/12, asymptomatic today without shortness of breath or chest pain. -On Eliquis 5 mg twice daily (7/12 - 10/12)  Chronic and Stable Problems: PVCs: Flecainide 50 mg BID, metoprolol 50 mg HLD: rosuvastatin 40 mg - hold due to interaction with daptomycin  FEN/GI: Regular diet, tolerating p.o. intake, PICC line and IV in place VTE Prophylaxis: Eliquis 5 mg twice daily Dispo: SNF when Prior Auth goes through.  Subjective:  This morning, patient is feeling well, denies fevers, chills, other infectious symptoms.  Breathing is good, no chest pain.  No pain of wounds.  Ready to go when SNF available.  Objective: Temp:  [98.5 F (36.9 C)-99.8 F (37.7 C)] 98.5 F (36.9 C) (08/21 0522) Pulse Rate:  [80-95] 80 (08/21 0522) Resp:  [17-19] 19 (08/21 0522) BP: (98-114)/(52-68) 98/56 (08/21 0522) SpO2:  [98 %-100 %]  100 % (08/21 0522)  Physical Exam: General: Adult male, resting  comfortably in bed, deconditioned, NAD, alert and at baseline. Cardiovascular: Regular rate and rhythm. Normal S1/S2. No murmurs, rubs, or gallops appreciated. 2+ radial pulses. Pulmonary: Clear bilaterally to ascultation. No increased WOB, no accessory muscle usage on room air. No wheezes, rales, or crackles. Abdominal: Normoactive bowel sounds, nondistended. No tenderness to deep or light palpation. No rebound or guarding. No HSM. Skin: Sacral and bilateral hip wounds packed and dressed.  Not draining. Extremities: No peripheral edema bilaterally.  Some toe amputations. 2+ PT pulses bilaterally.  Laboratory: Most recent CBC Lab Results  Component Value Date   WBC 10.3 09/10/2022   HGB 7.9 (L) 09/10/2022   HCT 27.3 (L) 09/10/2022   MCV 79.4 (L) 09/10/2022   PLT 560 (H) 09/10/2022   Most recent BMP    Latest Ref Rng & Units 09/10/2022    3:25 AM  BMP  Glucose 70 - 99 mg/dL 161   BUN 6 - 20 mg/dL 14   Creatinine 0.96 - 1.24 mg/dL 0.45   Sodium 409 - 811 mmol/L 136   Potassium 3.5 - 5.1 mmol/L 4.2   Chloride 98 - 111 mmol/L 97   CO2 22 - 32 mmol/L 28   Calcium 8.9 - 10.3 mg/dL 9.6     Other pertinent labs: - None  New Imaging/Diagnostic Tests: - None  Fatina Sprankle, MD 09/10/2022, 7:06 AM [facility]  FMTS Intern pager: [phone], text pages welcome Secure chat group [facility] Teaching Service

## 2022-09-11 DIAGNOSIS — L89154 Pressure ulcer of sacral region, stage 4: Secondary | ICD-10-CM | POA: Diagnosis not present

## 2022-09-11 DIAGNOSIS — E785 Hyperlipidemia, unspecified: Secondary | ICD-10-CM | POA: Diagnosis not present

## 2022-09-11 DIAGNOSIS — M869 Osteomyelitis, unspecified: Secondary | ICD-10-CM | POA: Diagnosis not present

## 2022-09-11 DIAGNOSIS — I10 Essential (primary) hypertension: Secondary | ICD-10-CM | POA: Diagnosis not present

## 2022-09-11 DIAGNOSIS — M6281 Muscle weakness (generalized): Secondary | ICD-10-CM | POA: Diagnosis not present

## 2022-09-11 DIAGNOSIS — J45909 Unspecified asthma, uncomplicated: Secondary | ICD-10-CM | POA: Diagnosis not present

## 2022-09-11 DIAGNOSIS — E7800 Pure hypercholesterolemia, unspecified: Secondary | ICD-10-CM | POA: Diagnosis not present

## 2022-09-11 DIAGNOSIS — I82611 Acute embolism and thrombosis of superficial veins of right upper extremity: Secondary | ICD-10-CM | POA: Diagnosis not present

## 2022-09-11 DIAGNOSIS — R5381 Other malaise: Secondary | ICD-10-CM | POA: Diagnosis not present

## 2022-09-11 DIAGNOSIS — R059 Cough, unspecified: Secondary | ICD-10-CM | POA: Diagnosis not present

## 2022-09-11 DIAGNOSIS — Z7401 Bed confinement status: Secondary | ICD-10-CM | POA: Diagnosis not present

## 2022-09-11 DIAGNOSIS — Z48817 Encounter for surgical aftercare following surgery on the skin and subcutaneous tissue: Secondary | ICD-10-CM | POA: Diagnosis not present

## 2022-09-11 DIAGNOSIS — L89612 Pressure ulcer of right heel, stage 2: Secondary | ICD-10-CM | POA: Diagnosis not present

## 2022-09-11 DIAGNOSIS — L02416 Cutaneous abscess of left lower limb: Secondary | ICD-10-CM | POA: Diagnosis not present

## 2022-09-11 DIAGNOSIS — I82509 Chronic embolism and thrombosis of unspecified deep veins of unspecified lower extremity: Secondary | ICD-10-CM | POA: Diagnosis not present

## 2022-09-11 DIAGNOSIS — D62 Acute posthemorrhagic anemia: Secondary | ICD-10-CM | POA: Diagnosis not present

## 2022-09-11 DIAGNOSIS — L02415 Cutaneous abscess of right lower limb: Secondary | ICD-10-CM | POA: Diagnosis not present

## 2022-09-11 DIAGNOSIS — R51 Headache with orthostatic component, not elsewhere classified: Secondary | ICD-10-CM | POA: Diagnosis not present

## 2022-09-11 DIAGNOSIS — M25511 Pain in right shoulder: Secondary | ICD-10-CM | POA: Diagnosis not present

## 2022-09-11 DIAGNOSIS — Z433 Encounter for attention to colostomy: Secondary | ICD-10-CM | POA: Diagnosis not present

## 2022-09-11 DIAGNOSIS — R509 Fever, unspecified: Secondary | ICD-10-CM | POA: Diagnosis not present

## 2022-09-11 DIAGNOSIS — I82411 Acute embolism and thrombosis of right femoral vein: Secondary | ICD-10-CM | POA: Diagnosis not present

## 2022-09-11 DIAGNOSIS — D649 Anemia, unspecified: Secondary | ICD-10-CM | POA: Diagnosis not present

## 2022-09-11 DIAGNOSIS — Z419 Encounter for procedure for purposes other than remedying health state, unspecified: Secondary | ICD-10-CM | POA: Diagnosis not present

## 2022-09-11 DIAGNOSIS — E876 Hypokalemia: Secondary | ICD-10-CM | POA: Diagnosis not present

## 2022-09-11 DIAGNOSIS — Q059 Spina bifida, unspecified: Secondary | ICD-10-CM | POA: Diagnosis not present

## 2022-09-11 LAB — AEROBIC/ANAEROBIC CULTURE W GRAM STAIN (SURGICAL/DEEP WOUND)

## 2022-09-11 LAB — COMPREHENSIVE METABOLIC PANEL
ALT: 28 U/L (ref 0–44)
AST: 19 U/L (ref 15–41)
Albumin: 2.1 g/dL — ABNORMAL LOW (ref 3.5–5.0)
Alkaline Phosphatase: 134 U/L — ABNORMAL HIGH (ref 38–126)
Anion gap: 11 (ref 5–15)
BUN: 18 mg/dL (ref 6–20)
CO2: 29 mmol/L (ref 22–32)
Calcium: 9.8 mg/dL (ref 8.9–10.3)
Chloride: 99 mmol/L (ref 98–111)
Creatinine, Ser: 0.54 mg/dL — ABNORMAL LOW (ref 0.61–1.24)
GFR, Estimated: >60 mL/min (ref >60)
Glucose, Bld: 118 mg/dL — ABNORMAL HIGH (ref 70–99)
Potassium: 4.2 mmol/L (ref 3.5–5.1)
Sodium: 139 mmol/L (ref 135–145)
Total Bilirubin: 0.2 mg/dL — ABNORMAL LOW (ref 0.3–1.2)
Total Protein: 7.4 g/dL (ref 6.5–8.1)

## 2022-09-11 LAB — CBC WITH DIFFERENTIAL/PLATELET
Abs Immature Granulocytes: 0.08 10*3/uL — ABNORMAL HIGH (ref 0.00–0.07)
Basophils Absolute: 0.2 10*3/uL — ABNORMAL HIGH (ref 0.0–0.1)
Basophils Relative: 2 %
Eosinophils Absolute: 1.2 10*3/uL — ABNORMAL HIGH (ref 0.0–0.5)
Eosinophils Relative: 12 %
HCT: 28.4 % — ABNORMAL LOW (ref 39.0–52.0)
Hemoglobin: 8.2 g/dL — ABNORMAL LOW (ref 13.0–17.0)
Immature Granulocytes: 1 %
Lymphocytes Relative: 16 %
Lymphs Abs: 1.5 10*3/uL (ref 0.7–4.0)
MCH: 23.1 pg — ABNORMAL LOW (ref 26.0–34.0)
MCHC: 28.9 g/dL — ABNORMAL LOW (ref 30.0–36.0)
MCV: 80 fL (ref 80.0–100.0)
Monocytes Absolute: 1.2 10*3/uL — ABNORMAL HIGH (ref 0.1–1.0)
Monocytes Relative: 12 %
Neutro Abs: 5.5 10*3/uL (ref 1.7–7.7)
Neutrophils Relative %: 57 %
Platelets: 552 10*3/uL — ABNORMAL HIGH (ref 150–400)
RBC: 3.55 MIL/uL — ABNORMAL LOW (ref 4.22–5.81)
RDW: 20.1 % — ABNORMAL HIGH (ref 11.5–15.5)
WBC: 9.6 10*3/uL (ref 4.0–10.5)
nRBC: 0 % (ref 0.0–0.2)

## 2022-09-11 LAB — CULTURE, BLOOD (ROUTINE X 2)
Culture: NO GROWTH
Culture: NO GROWTH
Special Requests: ADEQUATE

## 2022-09-11 MED ORDER — LINEZOLID 600 MG PO TABS
600.0000 mg | ORAL_TABLET | Freq: Two times a day (BID) | ORAL | Status: DC
  Administered 2022-09-11: 600 mg via ORAL
  Filled 2022-09-11 (×2): qty 1

## 2022-09-11 MED ORDER — SODIUM CHLORIDE 0.9 % IV SOLN: 1.0000 g | INTRAVENOUS | Status: AC

## 2022-09-11 MED ORDER — ERTAPENEM IV (FOR PTA / DISCHARGE USE ONLY): 1.0000 g | INTRAVENOUS | Status: AC

## 2022-09-11 MED ORDER — LINEZOLID 600 MG PO TABS: 600.0000 mg | ORAL_TABLET | Freq: Two times a day (BID) | ORAL | Status: AC

## 2022-09-11 MED ORDER — FLUCONAZOLE 200 MG PO TABS: 400.0000 mg | ORAL_TABLET | Freq: Every day | ORAL | Status: AC

## 2022-09-11 NOTE — Assessment & Plan Note (Deleted)
Patient required 5 units of PRBCs previous admission, most recent transfusion 8/9.  Hemoglobin currently stable at 8***, suspect downtrend d/t large amount of labs being drawn.  Requires transfusion with blood compatible with anti-E antibodies, which may need to be ordered from [facility]. -Continue to trend CBC daily -Transfusion threshold Hgb<7 -Labs in Peds tubes as able

## 2022-09-11 NOTE — Assessment & Plan Note (Deleted)
Noted to have consistent thrombophilia this admission and most of previous admission on chart review.  This is an acute phase reactant and some elevation is expected, but will CTM.

## 2022-09-11 NOTE — Plan of Care (Signed)
  Problem: Pain Management: Goal: Pain level will decrease with appropriate interventions Outcome: Progressing   Problem: Skin Integrity: Goal: Demonstration of wound healing without infection will improve Outcome: Progressing   

## 2022-09-11 NOTE — Plan of Care (Signed)
  Problem: Education: Goal: Knowledge of the prescribed therapeutic regimen will improve Outcome: Adequate for Discharge Goal: Ability to verbalize activity precautions or restrictions will improve Outcome: Adequate for Discharge Goal: Understanding of discharge needs will improve Outcome: Adequate for Discharge   Problem: Activity: Goal: Ability to perform//tolerate increased activity and mobilize with assistive devices will improve Outcome: Adequate for Discharge   Problem: Clinical Measurements: Goal: Postoperative complications will be avoided or minimized Outcome: Adequate for Discharge   Problem: Self-Care: Goal: Ability to meet self-care needs will improve Outcome: Adequate for Discharge   Problem: Self-Concept: Goal: Ability to maintain and perform role responsibilities to the fullest extent possible will improve Outcome: Adequate for Discharge   Problem: Pain Management: Goal: Pain level will decrease with appropriate interventions Outcome: Adequate for Discharge   Problem: Skin Integrity: Goal: Demonstration of wound healing without infection will improve Outcome: Adequate for Discharge   Problem: Education: Goal: Knowledge of General Education information will improve Description: Including pain rating scale, medication(s)/side effects and non-pharmacologic comfort measures Outcome: Adequate for Discharge

## 2022-09-11 NOTE — Assessment & Plan Note (Deleted)
Last changed 8/20, possible cystitis or ureteritis noted on admission CT, good UOP.  Urine culture unremarkable. -Strict I&Os

## 2022-09-11 NOTE — TOC Progression Note (Signed)
Transition of Care [facility]) - Progression Note    Patient Details  Name: STEFFON TAYMAN MRN: [id number] Date of Birth: 1970/06/16  Transition of Care [facility]) CM/SW Contact  Carley Hammed, LCSW Phone Number: 09/11/2022, 12:54 PM  Clinical Narrative:     [name] was advised pt's auth had gone to peer to peer. MD completed and authorization was approved, unsure if pt will DC today. Facility updated. TOC will continue to follow for DC needs.   Expected Discharge Plan: Skilled Nursing Facility Barriers to Discharge: Continued Medical Work up  Expected Discharge Plan and Services In-house Referral: Clinical Social Work Discharge Planning Services: Other - See comment (SW SNF) Post Acute Care Choice: Skilled Nursing Facility Living arrangements for the past 2 months: Apartment                 DME Arranged: N/A DME Agency: NA       HH Arranged: NA           Social Determinants of Health (SDOH) Interventions SDOH Screenings   Food Insecurity: No Food Insecurity (09/08/2022)  Housing: Low Risk  (09/08/2022)  Transportation Needs: No Transportation Needs (09/08/2022)  Utilities: Not At Risk (09/08/2022)  Alcohol Screen: Low Risk  (01/23/2022)  Depression (PHQ2-9): Low Risk  (02/27/2022)  Financial Resource Strain: Medium Risk (01/23/2022)  Physical Activity: Inactive (01/23/2022)  Social Connections: Socially Isolated (01/23/2022)  Stress: Stress Concern Present (01/23/2022)  Tobacco Use: Medium Risk (09/08/2022)    Readmission Risk Interventions     No data to display

## 2022-09-11 NOTE — Assessment & Plan Note (Deleted)
Febrile to 100.7 overnight, no ***leukocytosis. -ID on board, appreciate recs and assistance with source control -Blood cultures NG@5d , PICC line culture NG@3d  -Trend CBC and fever curve daily, still afebrile -Currently holding antipyretics to see if patient fevers, can restart antipyretics if febrile -Appreciate WOC recs -Holding rosuvastatin in setting of concurrent daptomycin given risk of rhabdo -Per ID, broadened antibiotic regimen below, may be able to transition to orals -Continue outpatient fluconazole 400 mg daily (8/8-9/12) -Switch meropenem (8/17-8/20) to ertapenem (8/20-9/12) -Switch linezolid 600 mg (8/17-8/20) to daptomycin (8/20-9/12)

## 2022-09-11 NOTE — Progress Notes (Signed)
  52 year old male with history of fifth and complex chronic sacral ulcer associated with bone and joint and soft tissue infection, urinary stone with chronic suprapubic catheter recent prolonged hospitalization for polymicrobial bacteremia secondary to sacral wound on 6 weeks of Dapto/fluconazole EOT 9/12 admitted for: #Worsening sacral wound -Turn from/for malaise, fever and increased purulent discharge from sacral process.  On presentation Tmax 100.1, WBC 14K. - Leukocytosis and fever have resolved - SPC was exchanged and urine was sent for culture, NG -pt fevered on on dapto with worsening eosinophilia on day 2. Will transition back to linezolid to complete course till 9/12. -Pt will need outpatient cbc on 9/1 as he will at the 2 week mark for linezolid(increase adverse events ). This can be done with PCP and sent to ID if abnormal.   -ID will sign off.   OPAT ORDERS:  Diagnosis: sacral wound  Allergies  Allergen Reactions   Firvanq [Vancomycin] Itching     Discharge antibiotics to be given via PICC line:  Per pharmacy protocol linezolid 600mg  pO bid, ertapenem 1gm IV per day and fluconzaole 200mg  PO bid   Duration: 4weeks total End Date: 9/12  [facility] Care Per Protocol with [employer] Use: Home health RN for IV administration and teaching, line care and labs.    Labs weekly while on IV antibiotics: x__ CBC with differential __ BMP **TWICE WEEKLY ON VANCOMYCIN  _x_ CMP _x_ CRP _x_ ESR __ Vancomycin trough TWICE WEEKLY __ CK  __ Please pull PIC at completion of IV antibiotics _x_ Please leave PIC in place until doctor has seen patient or been notified  Fax weekly labs to [phone]  Clinic Follow Up Appt: 9/10  @ [facility] with Dr. Drue Second

## 2022-09-11 NOTE — Assessment & Plan Note (Deleted)
Diverting colostomy 7/23 for infection prevention.  Ostomy well cared for, draining stool. - Bowel regimen: Senna 8.6 mg daily, Psyllium 1 packet daily, and Lactulose 10 g BID

## 2022-09-11 NOTE — Assessment & Plan Note (Deleted)
No surgical intervention recommended per Ortho, stable since 7/13 on MRI hips.

## 2022-09-11 NOTE — TOC Transition Note (Signed)
Transition of Care [facility]) - CM/SW Discharge Note   Patient Details  Name: Anthony [name] MRN: [id number] Date of Birth: 1970/07/05  Transition of Care [facility]) CM/SW Contact:  Carley Hammed, LCSW Phone Number: 09/11/2022, 3:34 PM   Clinical Narrative:    Pt to be transported to [facility] via [facility]. Nurse to call report to [phone]   Final next level of care: Skilled Nursing Facility Barriers to Discharge: Barriers Resolved   Patient Goals and CMS Choice CMS Medicare.gov Compare Post Acute Care list provided to:: Patient Choice offered to / list presented to : Patient  Discharge Placement                Patient chooses bed at:  [facility]) Patient to be transferred to facility by: [facility] Name of family member notified: Pt Patient and family notified of of transfer: 09/11/22  Discharge Plan and Services Additional resources added to the After Visit Summary for   In-house Referral: Clinical Social Work Discharge Planning Services: Other - See comment (SW SNF) Post Acute Care Choice: Skilled Nursing Facility          DME Arranged: N/A DME Agency: NA       HH Arranged: NA          Social Determinants of Health (SDOH) Interventions SDOH Screenings   Food Insecurity: No Food Insecurity (09/08/2022)  Housing: Low Risk  (09/08/2022)  Transportation Needs: No Transportation Needs (09/08/2022)  Utilities: Not At Risk (09/08/2022)  Alcohol Screen: Low Risk  (01/23/2022)  Depression (PHQ2-9): Low Risk  (02/27/2022)  Financial Resource Strain: Medium Risk (01/23/2022)  Physical Activity: Inactive (01/23/2022)  Social Connections: Socially Isolated (01/23/2022)  Stress: Stress Concern Present (01/23/2022)  Tobacco Use: Medium Risk (09/08/2022)     Readmission Risk Interventions     No data to display

## 2022-09-11 NOTE — Assessment & Plan Note (Deleted)
DVT of right upper extremity noted on 7/12, asymptomatic today without shortness of breath or chest pain. -On Eliquis 5 mg twice daily (7/12 - 10/12)

## 2022-09-13 LAB — CULTURE, BLOOD (SINGLE): Culture: NO GROWTH

## 2022-09-15 DIAGNOSIS — M869 Osteomyelitis, unspecified: Secondary | ICD-10-CM | POA: Diagnosis not present

## 2022-09-15 DIAGNOSIS — E876 Hypokalemia: Secondary | ICD-10-CM | POA: Diagnosis not present

## 2022-09-15 DIAGNOSIS — D62 Acute posthemorrhagic anemia: Secondary | ICD-10-CM | POA: Diagnosis not present

## 2022-09-15 DIAGNOSIS — Q059 Spina bifida, unspecified: Secondary | ICD-10-CM | POA: Diagnosis not present

## 2022-09-15 DIAGNOSIS — M6281 Muscle weakness (generalized): Secondary | ICD-10-CM | POA: Diagnosis not present

## 2022-09-15 DIAGNOSIS — I10 Essential (primary) hypertension: Secondary | ICD-10-CM | POA: Diagnosis not present

## 2022-09-15 DIAGNOSIS — E785 Hyperlipidemia, unspecified: Secondary | ICD-10-CM | POA: Diagnosis not present

## 2022-09-15 DIAGNOSIS — I82509 Chronic embolism and thrombosis of unspecified deep veins of unspecified lower extremity: Secondary | ICD-10-CM | POA: Diagnosis not present

## 2022-09-15 DIAGNOSIS — M25511 Pain in right shoulder: Secondary | ICD-10-CM | POA: Diagnosis not present

## 2022-09-17 DIAGNOSIS — E876 Hypokalemia: Secondary | ICD-10-CM | POA: Diagnosis not present

## 2022-09-17 DIAGNOSIS — Q059 Spina bifida, unspecified: Secondary | ICD-10-CM | POA: Diagnosis not present

## 2022-09-17 DIAGNOSIS — I10 Essential (primary) hypertension: Secondary | ICD-10-CM | POA: Diagnosis not present

## 2022-09-17 DIAGNOSIS — E785 Hyperlipidemia, unspecified: Secondary | ICD-10-CM | POA: Diagnosis not present

## 2022-09-17 DIAGNOSIS — I82509 Chronic embolism and thrombosis of unspecified deep veins of unspecified lower extremity: Secondary | ICD-10-CM | POA: Diagnosis not present

## 2022-09-17 DIAGNOSIS — M869 Osteomyelitis, unspecified: Secondary | ICD-10-CM | POA: Diagnosis not present

## 2022-09-17 DIAGNOSIS — M6281 Muscle weakness (generalized): Secondary | ICD-10-CM | POA: Diagnosis not present

## 2022-09-17 DIAGNOSIS — D62 Acute posthemorrhagic anemia: Secondary | ICD-10-CM | POA: Diagnosis not present

## 2022-09-17 DIAGNOSIS — M25511 Pain in right shoulder: Secondary | ICD-10-CM | POA: Diagnosis not present

## 2022-09-19 DIAGNOSIS — Q059 Spina bifida, unspecified: Secondary | ICD-10-CM | POA: Diagnosis not present

## 2022-09-19 DIAGNOSIS — D62 Acute posthemorrhagic anemia: Secondary | ICD-10-CM | POA: Diagnosis not present

## 2022-09-19 DIAGNOSIS — E785 Hyperlipidemia, unspecified: Secondary | ICD-10-CM | POA: Diagnosis not present

## 2022-09-19 DIAGNOSIS — M869 Osteomyelitis, unspecified: Secondary | ICD-10-CM | POA: Diagnosis not present

## 2022-09-19 DIAGNOSIS — I82509 Chronic embolism and thrombosis of unspecified deep veins of unspecified lower extremity: Secondary | ICD-10-CM | POA: Diagnosis not present

## 2022-09-19 DIAGNOSIS — M6281 Muscle weakness (generalized): Secondary | ICD-10-CM | POA: Diagnosis not present

## 2022-09-19 DIAGNOSIS — M25511 Pain in right shoulder: Secondary | ICD-10-CM | POA: Diagnosis not present

## 2022-09-19 DIAGNOSIS — I10 Essential (primary) hypertension: Secondary | ICD-10-CM | POA: Diagnosis not present

## 2022-09-19 DIAGNOSIS — E876 Hypokalemia: Secondary | ICD-10-CM | POA: Diagnosis not present

## 2022-09-22 DIAGNOSIS — M869 Osteomyelitis, unspecified: Secondary | ICD-10-CM | POA: Diagnosis not present

## 2022-09-22 DIAGNOSIS — I82509 Chronic embolism and thrombosis of unspecified deep veins of unspecified lower extremity: Secondary | ICD-10-CM | POA: Diagnosis not present

## 2022-09-22 DIAGNOSIS — M25511 Pain in right shoulder: Secondary | ICD-10-CM | POA: Diagnosis not present

## 2022-09-22 DIAGNOSIS — Q059 Spina bifida, unspecified: Secondary | ICD-10-CM | POA: Diagnosis not present

## 2022-09-22 DIAGNOSIS — M6281 Muscle weakness (generalized): Secondary | ICD-10-CM | POA: Diagnosis not present

## 2022-09-22 DIAGNOSIS — E876 Hypokalemia: Secondary | ICD-10-CM | POA: Diagnosis not present

## 2022-09-22 DIAGNOSIS — E785 Hyperlipidemia, unspecified: Secondary | ICD-10-CM | POA: Diagnosis not present

## 2022-09-22 DIAGNOSIS — D62 Acute posthemorrhagic anemia: Secondary | ICD-10-CM | POA: Diagnosis not present

## 2022-09-22 DIAGNOSIS — I10 Essential (primary) hypertension: Secondary | ICD-10-CM | POA: Diagnosis not present

## 2022-09-23 DIAGNOSIS — R509 Fever, unspecified: Secondary | ICD-10-CM | POA: Diagnosis not present

## 2022-09-23 DIAGNOSIS — R5381 Other malaise: Secondary | ICD-10-CM | POA: Diagnosis not present

## 2022-09-24 DIAGNOSIS — D62 Acute posthemorrhagic anemia: Secondary | ICD-10-CM | POA: Diagnosis not present

## 2022-09-24 DIAGNOSIS — M25511 Pain in right shoulder: Secondary | ICD-10-CM | POA: Diagnosis not present

## 2022-09-24 DIAGNOSIS — M6281 Muscle weakness (generalized): Secondary | ICD-10-CM | POA: Diagnosis not present

## 2022-09-24 DIAGNOSIS — I82509 Chronic embolism and thrombosis of unspecified deep veins of unspecified lower extremity: Secondary | ICD-10-CM | POA: Diagnosis not present

## 2022-09-24 DIAGNOSIS — Q059 Spina bifida, unspecified: Secondary | ICD-10-CM | POA: Diagnosis not present

## 2022-09-24 DIAGNOSIS — I10 Essential (primary) hypertension: Secondary | ICD-10-CM | POA: Diagnosis not present

## 2022-09-24 DIAGNOSIS — E785 Hyperlipidemia, unspecified: Secondary | ICD-10-CM | POA: Diagnosis not present

## 2022-09-24 DIAGNOSIS — E876 Hypokalemia: Secondary | ICD-10-CM | POA: Diagnosis not present

## 2022-09-24 DIAGNOSIS — M869 Osteomyelitis, unspecified: Secondary | ICD-10-CM | POA: Diagnosis not present

## 2022-09-25 DIAGNOSIS — L89612 Pressure ulcer of right heel, stage 2: Secondary | ICD-10-CM | POA: Diagnosis not present

## 2022-09-25 DIAGNOSIS — L89154 Pressure ulcer of sacral region, stage 4: Secondary | ICD-10-CM | POA: Diagnosis not present

## 2022-09-26 DIAGNOSIS — I10 Essential (primary) hypertension: Secondary | ICD-10-CM | POA: Diagnosis not present

## 2022-09-26 DIAGNOSIS — M6281 Muscle weakness (generalized): Secondary | ICD-10-CM | POA: Diagnosis not present

## 2022-09-26 DIAGNOSIS — E876 Hypokalemia: Secondary | ICD-10-CM | POA: Diagnosis not present

## 2022-09-26 DIAGNOSIS — M869 Osteomyelitis, unspecified: Secondary | ICD-10-CM | POA: Diagnosis not present

## 2022-09-26 DIAGNOSIS — D649 Anemia, unspecified: Secondary | ICD-10-CM | POA: Diagnosis not present

## 2022-09-26 DIAGNOSIS — I82509 Chronic embolism and thrombosis of unspecified deep veins of unspecified lower extremity: Secondary | ICD-10-CM | POA: Diagnosis not present

## 2022-09-26 DIAGNOSIS — E785 Hyperlipidemia, unspecified: Secondary | ICD-10-CM | POA: Diagnosis not present

## 2022-09-26 DIAGNOSIS — M25511 Pain in right shoulder: Secondary | ICD-10-CM | POA: Diagnosis not present

## 2022-09-26 DIAGNOSIS — D62 Acute posthemorrhagic anemia: Secondary | ICD-10-CM | POA: Diagnosis not present

## 2022-09-26 DIAGNOSIS — Q059 Spina bifida, unspecified: Secondary | ICD-10-CM | POA: Diagnosis not present

## 2022-09-26 DIAGNOSIS — E7800 Pure hypercholesterolemia, unspecified: Secondary | ICD-10-CM | POA: Diagnosis not present

## 2022-09-29 DIAGNOSIS — M6281 Muscle weakness (generalized): Secondary | ICD-10-CM | POA: Diagnosis not present

## 2022-09-29 DIAGNOSIS — M869 Osteomyelitis, unspecified: Secondary | ICD-10-CM | POA: Diagnosis not present

## 2022-09-29 DIAGNOSIS — D62 Acute posthemorrhagic anemia: Secondary | ICD-10-CM | POA: Diagnosis not present

## 2022-09-29 DIAGNOSIS — E785 Hyperlipidemia, unspecified: Secondary | ICD-10-CM | POA: Diagnosis not present

## 2022-09-29 DIAGNOSIS — Q059 Spina bifida, unspecified: Secondary | ICD-10-CM | POA: Diagnosis not present

## 2022-09-29 DIAGNOSIS — E876 Hypokalemia: Secondary | ICD-10-CM | POA: Diagnosis not present

## 2022-09-29 DIAGNOSIS — I82509 Chronic embolism and thrombosis of unspecified deep veins of unspecified lower extremity: Secondary | ICD-10-CM | POA: Diagnosis not present

## 2022-09-29 DIAGNOSIS — I10 Essential (primary) hypertension: Secondary | ICD-10-CM | POA: Diagnosis not present

## 2022-09-29 DIAGNOSIS — M25511 Pain in right shoulder: Secondary | ICD-10-CM | POA: Diagnosis not present

## 2022-09-30 ENCOUNTER — Inpatient Hospital Stay: Payer: Medicare HMO | Admitting: Internal Medicine

## 2022-10-01 DIAGNOSIS — M869 Osteomyelitis, unspecified: Secondary | ICD-10-CM | POA: Diagnosis not present

## 2022-10-01 DIAGNOSIS — E876 Hypokalemia: Secondary | ICD-10-CM | POA: Diagnosis not present

## 2022-10-01 DIAGNOSIS — I10 Essential (primary) hypertension: Secondary | ICD-10-CM | POA: Diagnosis not present

## 2022-10-01 DIAGNOSIS — M6281 Muscle weakness (generalized): Secondary | ICD-10-CM | POA: Diagnosis not present

## 2022-10-01 DIAGNOSIS — E785 Hyperlipidemia, unspecified: Secondary | ICD-10-CM | POA: Diagnosis not present

## 2022-10-01 DIAGNOSIS — M25511 Pain in right shoulder: Secondary | ICD-10-CM | POA: Diagnosis not present

## 2022-10-01 DIAGNOSIS — I82509 Chronic embolism and thrombosis of unspecified deep veins of unspecified lower extremity: Secondary | ICD-10-CM | POA: Diagnosis not present

## 2022-10-01 DIAGNOSIS — D62 Acute posthemorrhagic anemia: Secondary | ICD-10-CM | POA: Diagnosis not present

## 2022-10-01 DIAGNOSIS — Q059 Spina bifida, unspecified: Secondary | ICD-10-CM | POA: Diagnosis not present

## 2022-10-02 DIAGNOSIS — L89612 Pressure ulcer of right heel, stage 2: Secondary | ICD-10-CM | POA: Diagnosis not present

## 2022-10-02 DIAGNOSIS — L89154 Pressure ulcer of sacral region, stage 4: Secondary | ICD-10-CM | POA: Diagnosis not present

## 2022-10-03 DIAGNOSIS — M869 Osteomyelitis, unspecified: Secondary | ICD-10-CM | POA: Diagnosis not present

## 2022-10-03 DIAGNOSIS — E876 Hypokalemia: Secondary | ICD-10-CM | POA: Diagnosis not present

## 2022-10-03 DIAGNOSIS — I82509 Chronic embolism and thrombosis of unspecified deep veins of unspecified lower extremity: Secondary | ICD-10-CM | POA: Diagnosis not present

## 2022-10-03 DIAGNOSIS — M25511 Pain in right shoulder: Secondary | ICD-10-CM | POA: Diagnosis not present

## 2022-10-03 DIAGNOSIS — I10 Essential (primary) hypertension: Secondary | ICD-10-CM | POA: Diagnosis not present

## 2022-10-03 DIAGNOSIS — M6281 Muscle weakness (generalized): Secondary | ICD-10-CM | POA: Diagnosis not present

## 2022-10-03 DIAGNOSIS — D62 Acute posthemorrhagic anemia: Secondary | ICD-10-CM | POA: Diagnosis not present

## 2022-10-03 DIAGNOSIS — Q059 Spina bifida, unspecified: Secondary | ICD-10-CM | POA: Diagnosis not present

## 2022-10-03 DIAGNOSIS — E785 Hyperlipidemia, unspecified: Secondary | ICD-10-CM | POA: Diagnosis not present

## 2022-10-07 ENCOUNTER — Telehealth: Payer: Self-pay | Admitting: Cardiology

## 2022-10-07 ENCOUNTER — Encounter: Payer: Self-pay | Admitting: Internal Medicine

## 2022-10-07 MED ORDER — FLECAINIDE ACETATE 50 MG PO TABS: 50.0000 mg | ORAL_TABLET | Freq: Two times a day (BID) | ORAL | 0 refills | Status: DC

## 2022-10-07 NOTE — Telephone Encounter (Signed)
Pt's medication was already sent to pt's pharmacy as requested. Confirmation received.

## 2022-10-07 NOTE — Telephone Encounter (Signed)
Error

## 2022-10-07 NOTE — Telephone Encounter (Signed)
*  STAT* If patient is at the pharmacy, call can be transferred to refill team.   1. Which medications need to be refilled? (please list name of each medication and dose if known)   flecainide (TAMBOCOR) 50 MG tablet   2. Would you like to learn more about the convenience, safety, & potential cost savings by using the [facility]?   3. Are you open to using the [facility] (Type [facility]. ).  4. Which pharmacy/location (including street and city if local pharmacy) is medication to be sent to?  [facility] - [place] ([place] - [address]   5. Do they need a 30 day or 90 day supply?  30 day  Patient stated he has 2 days left of this medication.  Patient has appointment scheduled on 9/25.

## 2022-10-07 NOTE — Telephone Encounter (Signed)
*  STAT* If patient is at the pharmacy, call can be transferred to refill team.   1. Which medications need to be refilled? (please list name of each medication and dose if known) flecainide (TAMBOCOR) 50 MG tablet  2. Which pharmacy/location (including street and city if local pharmacy) is medication to be sent to? [facility] - [place] ([place] - [address]   3. Do they need a 30 day or 90 day supply?  90 day supply

## 2022-10-07 NOTE — Telephone Encounter (Signed)
Pt's medication was sent to pt's pharmacy as requested. Confirmation received.  °

## 2022-10-08 ENCOUNTER — Telehealth: Payer: Self-pay

## 2022-10-08 DIAGNOSIS — L089 Local infection of the skin and subcutaneous tissue, unspecified: Secondary | ICD-10-CM

## 2022-10-08 DIAGNOSIS — G8220 Paraplegia, unspecified: Secondary | ICD-10-CM

## 2022-10-08 NOTE — Telephone Encounter (Signed)
Deanna with [facility] calls nurse line in regards to patients home health services.   She reports at this time they will not be able to provide services due to his mother being in ICU. She reports his mother is his caregiver and without her present they can not enter the home, per policy.   She reports he is in an unsafe "home situation" without his mother being there and being ultimately by himself. She reports the neighbor agreed to come over to check on him, however he could not promise an everyday visit. She reports if there were an emergency he would not be able to leave the home without assistance.   She reports APS was called by [facility] already.  She reports she wanted to make PCP aware of the situation.   Will forward to PCP.

## 2022-10-09 DIAGNOSIS — L89154 Pressure ulcer of sacral region, stage 4: Secondary | ICD-10-CM | POA: Diagnosis not present

## 2022-10-09 DIAGNOSIS — L89224 Pressure ulcer of left hip, stage 4: Secondary | ICD-10-CM | POA: Diagnosis not present

## 2022-10-09 DIAGNOSIS — I82411 Acute embolism and thrombosis of right femoral vein: Secondary | ICD-10-CM | POA: Diagnosis not present

## 2022-10-09 DIAGNOSIS — L89214 Pressure ulcer of right hip, stage 4: Secondary | ICD-10-CM | POA: Diagnosis not present

## 2022-10-09 DIAGNOSIS — M009 Pyogenic arthritis, unspecified: Secondary | ICD-10-CM | POA: Diagnosis not present

## 2022-10-09 DIAGNOSIS — Q059 Spina bifida, unspecified: Secondary | ICD-10-CM | POA: Diagnosis not present

## 2022-10-09 DIAGNOSIS — G8220 Paraplegia, unspecified: Secondary | ICD-10-CM | POA: Diagnosis not present

## 2022-10-09 DIAGNOSIS — L89613 Pressure ulcer of right heel, stage 3: Secondary | ICD-10-CM | POA: Diagnosis not present

## 2022-10-09 DIAGNOSIS — M8668 Other chronic osteomyelitis, other site: Secondary | ICD-10-CM | POA: Diagnosis not present

## 2022-10-10 ENCOUNTER — Other Ambulatory Visit: Payer: Self-pay | Admitting: Student

## 2022-10-10 ENCOUNTER — Telehealth: Payer: Self-pay | Admitting: Cardiology

## 2022-10-10 ENCOUNTER — Telehealth: Payer: Self-pay

## 2022-10-10 DIAGNOSIS — L089 Local infection of the skin and subcutaneous tissue, unspecified: Secondary | ICD-10-CM

## 2022-10-10 NOTE — Telephone Encounter (Signed)
Opened in error.   Veronda Prude, RN

## 2022-10-10 NOTE — Telephone Encounter (Signed)
Received call from Lillia Abed, RN with [facility].   Requesting verbal orders for one time per week for three weeks with 2 PRN visits. Also requesting social work evaluation. Provided verbal orders per protocol.   They are also requesting urgent referral for wound care due to Stage IV sacral pressure injury.   RN also reports that mother is now home from the hospital and her boyfriend is also residing in the residence.   Veronda Prude, RN

## 2022-10-10 NOTE — Telephone Encounter (Signed)
*  STAT* If patient is at the pharmacy, call can be transferred to refill team.   1. Which medications need to be refilled? (please list name of each medication and dose if known)   flecainide (TAMBOCOR) 50 MG tablet     2. Would you like to learn more about the convenience, safety, & potential cost savings by using the [facility]? N/A   3. Are you open to using the [facility] (Type [facility]. No   4. Which pharmacy/location (including street and city if local pharmacy) is medication to be sent to? [id number] - [place], [place] - [address]    5. Do they need a 30 day or 90 day supply? Needs enough medication to last him until 11/01 appt. Will be out of meds on 10/17.

## 2022-10-10 NOTE — Telephone Encounter (Signed)
Done. Jone Baseman, CMA

## 2022-10-10 NOTE — Progress Notes (Signed)
Received call from [facility] RN stating needs for urgent referral for wound care d/t known stage IV pressure ulcer which is appropriate given patients history of sepsis related to infected wounds. Referral placed.

## 2022-10-13 DIAGNOSIS — L89214 Pressure ulcer of right hip, stage 4: Secondary | ICD-10-CM | POA: Diagnosis not present

## 2022-10-13 DIAGNOSIS — Z933 Colostomy status: Secondary | ICD-10-CM | POA: Diagnosis not present

## 2022-10-13 DIAGNOSIS — L89224 Pressure ulcer of left hip, stage 4: Secondary | ICD-10-CM | POA: Diagnosis not present

## 2022-10-13 DIAGNOSIS — L89154 Pressure ulcer of sacral region, stage 4: Secondary | ICD-10-CM | POA: Diagnosis not present

## 2022-10-13 DIAGNOSIS — L89612 Pressure ulcer of right heel, stage 2: Secondary | ICD-10-CM | POA: Diagnosis not present

## 2022-10-13 MED ORDER — FLECAINIDE ACETATE 50 MG PO TABS: 50.0000 mg | ORAL_TABLET | Freq: Two times a day (BID) | ORAL | 0 refills | Status: DC

## 2022-10-13 NOTE — Telephone Encounter (Signed)
Pt's medication was sent to pt's pharmacy as requested. Confirmation received.  °

## 2022-10-14 DIAGNOSIS — M8668 Other chronic osteomyelitis, other site: Secondary | ICD-10-CM | POA: Diagnosis not present

## 2022-10-14 DIAGNOSIS — G8220 Paraplegia, unspecified: Secondary | ICD-10-CM | POA: Diagnosis not present

## 2022-10-14 DIAGNOSIS — L89612 Pressure ulcer of right heel, stage 2: Secondary | ICD-10-CM | POA: Diagnosis not present

## 2022-10-14 DIAGNOSIS — L89613 Pressure ulcer of right heel, stage 3: Secondary | ICD-10-CM | POA: Diagnosis not present

## 2022-10-14 DIAGNOSIS — I82411 Acute embolism and thrombosis of right femoral vein: Secondary | ICD-10-CM | POA: Diagnosis not present

## 2022-10-14 DIAGNOSIS — L89214 Pressure ulcer of right hip, stage 4: Secondary | ICD-10-CM | POA: Diagnosis not present

## 2022-10-14 DIAGNOSIS — Q059 Spina bifida, unspecified: Secondary | ICD-10-CM | POA: Diagnosis not present

## 2022-10-14 DIAGNOSIS — L89154 Pressure ulcer of sacral region, stage 4: Secondary | ICD-10-CM | POA: Diagnosis not present

## 2022-10-14 DIAGNOSIS — L89224 Pressure ulcer of left hip, stage 4: Secondary | ICD-10-CM | POA: Diagnosis not present

## 2022-10-14 DIAGNOSIS — Z933 Colostomy status: Secondary | ICD-10-CM | POA: Diagnosis not present

## 2022-10-14 DIAGNOSIS — M009 Pyogenic arthritis, unspecified: Secondary | ICD-10-CM | POA: Diagnosis not present

## 2022-10-14 NOTE — Addendum Note (Signed)
Addended by: Manson Passey, Cabela Pacifico on: 10/14/2022 04:16 PM   Modules accepted: Orders

## 2022-10-15 ENCOUNTER — Encounter: Payer: Self-pay | Admitting: *Deleted

## 2022-10-15 ENCOUNTER — Telehealth: Payer: Self-pay

## 2022-10-15 ENCOUNTER — Ambulatory Visit: Payer: Medicare HMO | Admitting: Nurse Practitioner

## 2022-10-15 ENCOUNTER — Ambulatory Visit: Payer: Self-pay | Admitting: *Deleted

## 2022-10-15 NOTE — Patient Instructions (Signed)
Visit Information  Thank you for taking time to visit with me today. Please don't hesitate to contact me if I can be of assistance to you.   Following are the goals we discussed today:   Goals Addressed               This Visit's Progress     Find Help in My Community. (pt-stated)   On track     Care Coordination Interventions:  Interventions Today    Flowsheet Row Most Recent Value  Chronic Disease   Chronic disease during today's visit Other  [Chronic Venous Insufficiency, Acute Deep Vein Thrombosis of Right Axillary Vein of Upper Extremity, Spina Bifida of Lumbar Region, Paraplegia, Septic Arthritis of Hips, Febril Illness, Decubitus Ulcer, Transportation Barrier, Needs Wheelchair Ramp]  General Interventions   General Interventions Discussed/Reviewed General Interventions Discussed, Labs, Vaccines, Doctor Visits, Referral to Nurse, Communication with, Level of Care, [facility], Health Screening, Annual Foot Exam, Lipid Profile, General Interventions Reviewed, Horticulturist, commercial (DME), Annual Eye Exam  [Encouraged]  Labs Hgb A1c every 3 months, Kidney Function  [Encouraged]  Vaccines COVID-19, Flu, Pneumonia, RSV, Shingles, Tetanus/Pertussis/Diphtheria  [Encouraged]  Doctor Visits Discussed/Reviewed PCP, Annual Wellness Visits, Doctor Visits Reviewed, Specialist, Doctor Visits Discussed  [Encouraged]  Health Screening Bone Density, Colonoscopy, Prostate, Mammogram  [Encouraged]  Durable Medical Equipment (DME) Bed side commode, BP Cuff, Shower bench, Wheelchair  [Encouraged]  Wheelchair Motorized  [Encouraged]  PCP/Specialist Visits Compliance with follow-up visit, Contact provider for referral to  [employer] provider for referral to --  [N/A]  Communication with PCP/Specialists, Charity fundraiser, Pharmacists  [Encouraged]  Level of Care Adult Daycare, Personal Care Services, Applications, Assisted Living, Skilled Nursing Facility  [Encouraged]  Applications  Medicaid, Personal Care Services, [place]  [Encouraged]  Exercise Interventions   Exercise Discussed/Reviewed Exercise Discussed, Assistive device use and maintanence, Weight Managment, Physical Activity, Exercise Reviewed  [Encouraged]  Physical Activity Discussed/Reviewed Gym, PREP, Home Exercise Program (HEP), Physical Activity Discussed, Physical Activity Reviewed, Types of exercise  [Encouraged]  Weight Management Weight maintenance  [Encouraged]  Education Interventions   Education Provided Provided Therapist, sports, Provided Web-based Education, Provided Education  [Encouraged]  Provided Verbal Education On Nutrition, Mental Health/Coping with Illness, When to see the doctor, Foot Care, Eye Care, Applications, Labs, [facility], Exercise, Blood Sugar Monitoring, Medication, Insurance Plans  [Encouraged]  Labs Reviewed --  [N/A]  Applications Medicaid, Personal Care Services, [place]  [Encouraged]  Mental Health Interventions   Mental Health Discussed/Reviewed Mental Health Discussed, Anxiety, Depression, Mental Health Reviewed, Grief and Loss, Substance Abuse, Coping Strategies, Crisis, Other, Suicide  [Domestic Violence]  Nutrition Interventions   Nutrition Discussed/Reviewed Nutrition Discussed, Adding fruits and vegetables, Increasing proteins, Decreasing fats, Decreasing salt, Supplemental nutrition, Decreasing sugar intake, Portion sizes, Carbohydrate meal planning, Fluid intake, Nutrition Reviewed  [Encouraged]  Pharmacy Interventions   Pharmacy Dicussed/Reviewed Pharmacy Topics Discussed, Pharmacy Topics Reviewed, Affording Medications, Medication Adherence, Medications and their functions  [Encouraged]  Medication Adherence --  [N/A]  Safety Interventions   Safety Discussed/Reviewed Safety Discussed, Safety Reviewed, Fall Risk, Home Safety  [Encouraged]  Home Safety Assistive Devices, Need for home safety assessment, Refer for home visit, Refer for community resources   [Encouraged]  Advanced Directive Interventions   Advanced Directives Discussed/Reviewed Advanced Directives Discussed, Advanced Directives Reviewed  [Completed]      Assessed Social Determinant of Health Barriers. Discussed Plans for Ongoing Care Management Follow Up. Provided Careers information officer Information for Care Management Team Members. Screened for Signs & Symptoms  of Depression, Related to Chronic Disease State.  PHQ2 & PHQ9 Depression Screen Completed & Results Reviewed.  Suicidal Ideation & Homicidal Ideation Assessed - None Present.   Domestic Violence Assessed - None Present. Access to Weapons Assessed - None Present.   Active Listening & Reflection Utilized.  Verbalization of Feelings Encouraged.  Emotional Support Provided. Symptoms of Chronic Pain Validated. Feelings of Anxiousness, Nervousness, & Restlessness Acknowledged. Educated on Proper Use of Deep Breathing Exercises, Relaxation Techniques, & Mindfulness Meditation Strategies, & Encouraged Daily Implementation. Self-Enrollment in Support Group of Interest Emphasized, from List Provided. Crisis Support Information, Agencies, Services, & Resources Discussed. Problem Solving Interventions Identified. Task-Centered Solutions Implemented.   Solution-Focused Strategies Developed. Acceptance & Commitment Therapy Introduced. Brief Cognitive Behavioral Therapy Initiated. Client-Centered Therapy Enacted. Quality of Sleep Assessed & Sleep Hygiene Techniques Promoted. Confirmed Approval for Full Adult Medicaid Coverage/Benefits, through [facility] [phone]). Confirmed Disinterest in Pursuing Higher Level of Care Placement Options, Denying Need for Short-Term Rehabilitative Services.  Reviewed Prescription Medications & Discussed Importance of Compliance. Encouraged Administration of Medications, Exactly as Prescribed. Encouraged Routine Engagement in Activities of Interest, Inside &  Outside the Home. Encouraged to Continue to Reside with Mother to Receive Assistance with Activities of Daily Living, Care, & Supervision, Until Shoulder is Completely Healed. Encouraged Daily Implementation of Deep Breathing Exercises, Relaxation Techniques, & Mindfulness Meditation Strategies. Encouraged Use of Transportation Benefits through [employer] (# [phone]) & [employer] [phone]). Encouraged Review of [name], [name], & Resources in [employer] with Wheelchair [employer], Emailed on 10/15/2022, Which Include the Following:  ~ [facility] - (# [phone])  ~ [name] - [phone])  ~ [employer] - (# [phone]) Encouraged Engagement with Home Health Skilled Nursing for Wound Care & Dressing Changes & Physical Therapist for Conditioning & Strengthening, through [facility] [phone]). Encouraged Contact with [name] (# [phone]), if You Have Questions, Need Assistance, or If Additional Social Work Needs Are Identified Between Now & Our Next Follow-Up Outreach Call, Scheduled on 10/29/2022 at 1:30 PM. Encouraged Attendance at Follow-Up Appointment with [name], Certified Physician Assistant with [facility] at [facility] [phone]), Scheduled on 11/10/2022 at 1:30 PM. Encouraged Attendance at Follow-Up Appointment with [name], General Surgeon with [facility] at [facility] [phone]), Scheduled on 11/11/2022 at 12:45 PM. Encouraged Attendance at Follow-Up Appointment with [name], Physician Assistant with [facility] at [facility] [phone]), Scheduled on 11/21/2022 at 11:40 AM.      Our next appointment is by telephone on 10/29/2022 at 1:30 pm.  Please call the care guide team at [phone] if  you need to cancel or reschedule your appointment.   If you are experiencing a Mental Health or Behavioral Health Crisis or need someone to talk to, please call the Suicide and Crisis Lifeline: 988 call the [place] National Suicide Prevention Lifeline: 361-027-8417 or TTY: 4380379687 TTY (718)207-2149) to talk to a trained counselor call 1-800-273-TALK (toll free, 24 hour hotline) go to [facility] [address], [place] [phone]) call the [facility] Crisis Line: [phone] call 911  Patient verbalizes understanding of instructions and care plan provided today and agrees to view in MyChart. Active MyChart status and patient understanding of how to access instructions and care plan via MyChart confirmed with patient.     Telephone follow  up appointment with care management team member scheduled for:  10/29/2022 at 1:30 pm.  Danford Bad, BSW, MSW, LCSW  Embedded Practice Social Work Case Manager  [facility], Population Health Direct Dial: [phone]  Fax: [phone] Email: [email or web address]: [email or web address]

## 2022-10-15 NOTE — Patient Outreach (Signed)
Care Coordination   Initial Visit Note   10/15/2022  Name: Anthony [name] MRN: [id number] DOB: 1970/04/07  Anthony [name] is a 52 y.o. year old male who sees Erick Alley, DO for primary care. I spoke with [name] by phone today.  What matters to the patients health and wellness today?  Find Help in My Community.    Goals Addressed               This Visit's Progress     Find Help in My Community. (pt-stated)   On track     Care Coordination Interventions:  Interventions Today    Flowsheet Row Most Recent Value  Chronic Disease   Chronic disease during today's visit Other  [Chronic Venous Insufficiency, Acute Deep Vein Thrombosis of Right Axillary Vein of Upper Extremity, Spina Bifida of Lumbar Region, Paraplegia, Septic Arthritis of Hips, Febril Illness, Decubitus Ulcer, Transportation Barrier, Needs Wheelchair Ramp]  General Interventions   General Interventions Discussed/Reviewed General Interventions Discussed, Labs, Vaccines, Doctor Visits, Referral to Nurse, Communication with, Level of Care, [facility], Health Screening, Annual Foot Exam, Lipid Profile, General Interventions Reviewed, Horticulturist, commercial (DME), Annual Eye Exam  [Encouraged]  Labs Hgb A1c every 3 months, Kidney Function  [Encouraged]  Vaccines COVID-19, Flu, Pneumonia, RSV, Shingles, Tetanus/Pertussis/Diphtheria  [Encouraged]  Doctor Visits Discussed/Reviewed PCP, Annual Wellness Visits, Doctor Visits Reviewed, Specialist, Doctor Visits Discussed  [Encouraged]  Health Screening Bone Density, Colonoscopy, Prostate, Mammogram  [Encouraged]  Durable Medical Equipment (DME) Bed side commode, BP Cuff, Shower bench, Wheelchair  [Encouraged]  Wheelchair Motorized  [Encouraged]  PCP/Specialist Visits Compliance with follow-up visit, Contact provider for referral to  [employer] provider for referral to --  [N/A]  Communication with PCP/Specialists, Charity fundraiser, Pharmacists  [Encouraged]   Level of Care Adult Daycare, Personal Care Services, Applications, Assisted Living, Skilled Nursing Facility  [Encouraged]  Applications Medicaid, Personal Care Services, [place]  [Encouraged]  Exercise Interventions   Exercise Discussed/Reviewed Exercise Discussed, Assistive device use and maintanence, Weight Managment, Physical Activity, Exercise Reviewed  [Encouraged]  Physical Activity Discussed/Reviewed Gym, PREP, Home Exercise Program (HEP), Physical Activity Discussed, Physical Activity Reviewed, Types of exercise  [Encouraged]  Weight Management Weight maintenance  [Encouraged]  Education Interventions   Education Provided Provided Therapist, sports, Provided Web-based Education, Provided Education  [Encouraged]  Provided Verbal Education On Nutrition, Mental Health/Coping with Illness, When to see the doctor, Foot Care, Eye Care, Applications, Labs, [employer], Exercise, Blood Sugar Monitoring, Medication, Insurance Plans  [Encouraged]  Labs Reviewed --  [N/A]  Applications Medicaid, Personal Care Services, [place]  [Encouraged]  Mental Health Interventions   Mental Health Discussed/Reviewed Mental Health Discussed, Anxiety, Depression, Mental Health Reviewed, Grief and Loss, Substance Abuse, Coping Strategies, Crisis, Other, Suicide  [Domestic Violence]  Nutrition Interventions   Nutrition Discussed/Reviewed Nutrition Discussed, Adding fruits and vegetables, Increasing proteins, Decreasing fats, Decreasing salt, Supplemental nutrition, Decreasing sugar intake, Portion sizes, Carbohydrate meal planning, Fluid intake, Nutrition Reviewed  [Encouraged]  Pharmacy Interventions   Pharmacy Dicussed/Reviewed Pharmacy Topics Discussed, Pharmacy Topics Reviewed, Affording Medications, Medication Adherence, Medications and their functions  [Encouraged]  Medication Adherence --  [N/A]  Safety Interventions   Safety Discussed/Reviewed Safety Discussed, Safety Reviewed, Fall Risk, Home Safety   [Encouraged]  Home Safety Assistive Devices, Need for home safety assessment, Refer for home visit, Refer for community resources  [Encouraged]  Advanced Directive Interventions   Advanced Directives Discussed/Reviewed Advanced Directives Discussed, Advanced Directives Reviewed  [Completed]  Assessed Social Determinant of Health Barriers. Discussed Plans for Ongoing Care Management Follow Up. Provided Careers information officer Information for Care Management Team Members. Screened for Signs & Symptoms of Depression, Related to Chronic Disease State.  PHQ2 & PHQ9 Depression Screen Completed & Results Reviewed.  Suicidal Ideation & Homicidal Ideation Assessed - None Present.   Domestic Violence Assessed - None Present. Access to Weapons Assessed - None Present.   Active Listening & Reflection Utilized.  Verbalization of Feelings Encouraged.  Emotional Support Provided. Symptoms of Chronic Pain Validated. Feelings of Anxiousness, Nervousness, & Restlessness Acknowledged. Educated on Proper Use of Deep Breathing Exercises, Relaxation Techniques, & Mindfulness Meditation Strategies, & Encouraged Daily Implementation. Self-Enrollment in Support Group of Interest Emphasized, from List Provided. Crisis Support Information, Agencies, Services, & Resources Discussed. Problem Solving Interventions Identified. Task-Centered Solutions Implemented.   Solution-Focused Strategies Developed. Acceptance & Commitment Therapy Introduced. Brief Cognitive Behavioral Therapy Initiated. Client-Centered Therapy Enacted. Quality of Sleep Assessed & Sleep Hygiene Techniques Promoted. Confirmed Approval for Full Adult Medicaid Coverage/Benefits, through [facility] of Social Services [phone]). Confirmed Disinterest in Pursuing Higher Level of Care Placement Options, Denying Need for Short-Term Rehabilitative Services.  Reviewed Prescription Medications & Discussed Importance of  Compliance. Encouraged Administration of Medications, Exactly as Prescribed. Encouraged Routine Engagement in Activities of Interest, Inside & Outside the Home. Encouraged to Continue to Reside with Mother to Receive Assistance with Activities of Daily Living, Care, & Supervision, Until Shoulder is Completely Healed. Encouraged Daily Implementation of Deep Breathing Exercises, Relaxation Techniques, & Mindfulness Meditation Strategies. Encouraged Use of Transportation Benefits through [employer] (# ([phone]) & [employer] [phone]). Encouraged Review of [name], [name], & Resources in [employer] with Wheelchair [employer], Emailed on 10/15/2022, Which Include the Following:  ~ [facility] Independent Living Rehabilitation Program - (# [phone])  ~ [name] - 579-611-1680)  ~ [employer] - (# [phone]) Encouraged Engagement with Home Health Skilled Nursing for Wound Care & Dressing Changes & Physical Therapist for Conditioning & Strengthening, through [facility] [phone]). Encouraged Contact with [name] (# [phone]), if You Have Questions, Need Assistance, or If Additional Social Work Needs Are Identified Between Now & Our Next Follow-Up Outreach Call, Scheduled on 10/29/2022 at 1:30 PM. Encouraged Attendance at Follow-Up Appointment with [name], Certified Physician Assistant with [facility] at [facility] [phone]), Scheduled on 11/10/2022 at 1:30 PM. Encouraged Attendance at Follow-Up Appointment with [name], General Surgeon with [facility] Wound Care & Hyperbaric Center at [facility] [phone]), Scheduled on 11/11/2022 at 12:45 PM. Encouraged Attendance at Follow-Up Appointment with [name], Physician Assistant with [facility] at [facility] [phone]), Scheduled on  11/21/2022 at 11:40 AM.        SDOH assessments and interventions completed:  Yes.  SDOH Interventions Today    Flowsheet Row Most Recent Value  SDOH Interventions   Food Insecurity Interventions Intervention Not Indicated  Housing Interventions Intervention Not Indicated  Transportation Interventions Intervention Not Indicated, Patient Resources (Friends/Family), Payor Benefit  [employer] Transportation & Medicaid Transportation]  Utilities Interventions Intervention Not Indicated  Alcohol Usage Interventions Intervention Not Indicated (Score <7)  Financial Strain Interventions Intervention Not Indicated  [Approved for Medicaid]  Physical Activity Interventions Intervention Not Indicated, Other (Comments)  [Paraplegic]  Stress Interventions Intervention Not Indicated, Offered [employer], Provide Counseling  Social Connections Interventions Intervention Not Indicated  Health Literacy Interventions Intervention Not Indicated  Care Coordination Interventions:  Yes, provided.   Follow up plan: Follow up call scheduled for 10/29/2022 at 1:30 pm.  Encounter Outcome:  Patient Visit Completed.   Danford Bad, BSW, MSW, Printmaker Social Work Case Set designer Health  [facility], Population Health Direct Dial: [phone]  Fax: [phone] Email: [email or web address]: [email or web address]

## 2022-10-15 NOTE — Telephone Encounter (Signed)
Received call from [place], [facility] RN regarding wound care for patient. She reports that insurance will only cover 30 ABD pads per month. She is asking if she can supplement with Sorbex pads (thicker type ABD pad) that insurance will cover.   Will forward to PCP.   Phone number for Lillia Abed: [phone].  Veronda Prude, RN

## 2022-10-17 DIAGNOSIS — L89154 Pressure ulcer of sacral region, stage 4: Secondary | ICD-10-CM | POA: Diagnosis not present

## 2022-10-17 DIAGNOSIS — L89612 Pressure ulcer of right heel, stage 2: Secondary | ICD-10-CM | POA: Diagnosis not present

## 2022-10-17 DIAGNOSIS — Z933 Colostomy status: Secondary | ICD-10-CM | POA: Diagnosis not present

## 2022-10-17 DIAGNOSIS — L89214 Pressure ulcer of right hip, stage 4: Secondary | ICD-10-CM | POA: Diagnosis not present

## 2022-10-17 NOTE — Telephone Encounter (Signed)
Received call from [place]. Gave okay for change in dressings. She also wants to report that patient refused OT visit and LPN visit this week. They will attempt again next week.   FYI to PCP.   Veronda Prude, RN

## 2022-10-21 DIAGNOSIS — L89613 Pressure ulcer of right heel, stage 3: Secondary | ICD-10-CM | POA: Diagnosis not present

## 2022-10-21 DIAGNOSIS — M8668 Other chronic osteomyelitis, other site: Secondary | ICD-10-CM | POA: Diagnosis not present

## 2022-10-21 DIAGNOSIS — L89154 Pressure ulcer of sacral region, stage 4: Secondary | ICD-10-CM | POA: Diagnosis not present

## 2022-10-21 DIAGNOSIS — L89224 Pressure ulcer of left hip, stage 4: Secondary | ICD-10-CM | POA: Diagnosis not present

## 2022-10-21 DIAGNOSIS — Q059 Spina bifida, unspecified: Secondary | ICD-10-CM | POA: Diagnosis not present

## 2022-10-21 DIAGNOSIS — I82411 Acute embolism and thrombosis of right femoral vein: Secondary | ICD-10-CM | POA: Diagnosis not present

## 2022-10-21 DIAGNOSIS — L89214 Pressure ulcer of right hip, stage 4: Secondary | ICD-10-CM | POA: Diagnosis not present

## 2022-10-21 DIAGNOSIS — G8220 Paraplegia, unspecified: Secondary | ICD-10-CM | POA: Diagnosis not present

## 2022-10-21 DIAGNOSIS — M009 Pyogenic arthritis, unspecified: Secondary | ICD-10-CM | POA: Diagnosis not present

## 2022-10-29 ENCOUNTER — Ambulatory Visit: Payer: Self-pay | Admitting: *Deleted

## 2022-10-29 NOTE — Patient Outreach (Signed)
Care Coordination   Follow Up Visit Note   10/29/2022 Name: Anthony [name] MRN: [id number] DOB: 1970/05/21  Anthony [name] is a 52 y.o. year old male who sees Anthony Alley, DO for primary care. I spoke with  Anthony [name] by phone today.  What matters to the patients health and wellness today?  Pt reports he and his mom are doing fine- denies further needs.     Goals Addressed               This Visit's Progress     COMPLETED: Find Help in My Community. (pt-stated)          Encouraged to Continue to Reside with Mother to Receive Assistance with Activities of Daily Living, Care, & Supervision, Until Shoulder is Completely Healed. Encouraged Use of Transportation Benefits through [employer] (# [phone]) & [employer] [phone]). Encouraged Review of [name], [name], & Resources in [employer] with Wheelchair [employer], Emailed on 10/15/2022, Which Include the Following:  ~ [name] Independent Living Rehabilitation Program - (# [phone])  ~ [name] - [phone])  ~ [employer] - (# [phone]) Encouraged Engagement with Home Health Skilled Nursing for Wound Care & Dressing Changes & Physical Therapist for Conditioning & Strengthening, through [name] [phone]). Encouraged Contact with [name] (# [phone]), if You Have Questions, Need Assistance, or If Additional Social Work Needs Are Identified  Encouraged Attendance at Follow-Up Appointment with [name], Certified Physician Assistant with [facility] at [facility] [phone]), Scheduled on 11/10/2022 at 1:30 PM. Encouraged Attendance at Follow-Up Appointment with [name], General Surgeon with [facility] Wound Care & Hyperbaric Center at [facility] [phone]), Scheduled on 11/11/2022 at 12:45 PM. Encouraged Attendance at Follow-Up  Appointment with [name], Physician Assistant with [facility] at [facility] [phone]), Scheduled on 11/21/2022 at 11:40 AM.        SDOH assessments and interventions completed:  Yes     Care Coordination Interventions:  Yes, provided  Interventions Today    Flowsheet Row Most Recent Value  Chronic Disease   Chronic disease during today's visit Other  [spina bifida]  General Interventions   General Interventions Discussed/Reviewed General Interventions Reviewed, [employer] reports he received material sent by other [name]- has npo questions or needs at this time.]  Applications Medicaid  [Pt reports he now has [employer] and Medicaidnow-]  Education Interventions   Provided Verbal Education On Mental Health/Coping with Illness, [employer] pt to reachout if concerns or needs arise we can assist with]  Applications Medicaid  [Pt reports he now has [employer] and Medicaidnow-]       Follow up plan: No further intervention required.   Encounter Outcome:  Patient Visit Completed

## 2022-10-29 NOTE — Patient Instructions (Signed)
Visit Information  Thank you for taking time to visit with me today. Please don't hesitate to contact me if I can be of assistance to you.   Following are the goals we discussed today:   Goals Addressed               This Visit's Progress     COMPLETED: Find Help in My Community. (pt-stated)          Encouraged to Continue to Reside with Mother to Receive Assistance with Activities of Daily Living, Care, & Supervision, Until Shoulder is Completely Healed. Encouraged Use of Transportation Benefits through [employer] (# [phone]) & [employer] [phone]). Encouraged Review of [name], [name], & Resources in [employer] with Wheelchair [employer], Emailed on 10/15/2022, Which Include the Following:  ~ [facility] Independent Living Rehabilitation Program - (# [phone])  ~ [name] - [phone]) [phone])  ~ [employer] - (# [phone]) Encouraged Engagement with Home Health Skilled Nursing for Wound Care & Dressing Changes & Physical Therapist for Conditioning & Strengthening, through [facility] Agency [phone]). Encouraged Contact with [name] (# [phone]), if You Have Questions, Need Assistance, or If Additional Social Work Needs Are Identified  Encouraged Attendance at Follow-Up Appointment with [name], Certified Physician Assistant with [facility] HeartCare at [facility] [phone]), Scheduled on 11/10/2022 at 1:30 PM. Encouraged Attendance at Follow-Up Appointment with [name], General Surgeon with [facility] Wound Care & Hyperbaric Center at [facility] [phone]), Scheduled on 11/11/2022 at 12:45 PM. Encouraged Attendance at Follow-Up Appointment with [name], Physician Assistant with [facility] at [facility] [phone]), Scheduled on 11/21/2022 at 11:40 AM.        Please call your  PCP office or our care guide team at [phone] if you need to schedule further appointment.   If you are experiencing a Mental Health or Behavioral Health Crisis or need someone to talk to, please call the Suicide and Crisis Lifeline: 988 call 911   The patient verbalized understanding of instructions, educational materials, and care plan provided today and DECLINED offer to receive copy of patient instructions, educational materials, and care plan.   No further follow up required:    Reece Levy, MSW, LCSW Clinical Social Worker [phone]

## 2022-10-31 DIAGNOSIS — L89613 Pressure ulcer of right heel, stage 3: Secondary | ICD-10-CM | POA: Diagnosis not present

## 2022-10-31 DIAGNOSIS — I82411 Acute embolism and thrombosis of right femoral vein: Secondary | ICD-10-CM | POA: Diagnosis not present

## 2022-10-31 DIAGNOSIS — L89224 Pressure ulcer of left hip, stage 4: Secondary | ICD-10-CM | POA: Diagnosis not present

## 2022-10-31 DIAGNOSIS — Q059 Spina bifida, unspecified: Secondary | ICD-10-CM | POA: Diagnosis not present

## 2022-10-31 DIAGNOSIS — L89154 Pressure ulcer of sacral region, stage 4: Secondary | ICD-10-CM | POA: Diagnosis not present

## 2022-10-31 DIAGNOSIS — M009 Pyogenic arthritis, unspecified: Secondary | ICD-10-CM | POA: Diagnosis not present

## 2022-10-31 DIAGNOSIS — M8668 Other chronic osteomyelitis, other site: Secondary | ICD-10-CM | POA: Diagnosis not present

## 2022-10-31 DIAGNOSIS — G8220 Paraplegia, unspecified: Secondary | ICD-10-CM | POA: Diagnosis not present

## 2022-10-31 DIAGNOSIS — L89214 Pressure ulcer of right hip, stage 4: Secondary | ICD-10-CM | POA: Diagnosis not present

## 2022-11-04 DIAGNOSIS — M009 Pyogenic arthritis, unspecified: Secondary | ICD-10-CM | POA: Diagnosis not present

## 2022-11-04 DIAGNOSIS — L89613 Pressure ulcer of right heel, stage 3: Secondary | ICD-10-CM | POA: Diagnosis not present

## 2022-11-04 DIAGNOSIS — Q059 Spina bifida, unspecified: Secondary | ICD-10-CM | POA: Diagnosis not present

## 2022-11-04 DIAGNOSIS — L89154 Pressure ulcer of sacral region, stage 4: Secondary | ICD-10-CM | POA: Diagnosis not present

## 2022-11-04 DIAGNOSIS — I82411 Acute embolism and thrombosis of right femoral vein: Secondary | ICD-10-CM | POA: Diagnosis not present

## 2022-11-04 DIAGNOSIS — G8220 Paraplegia, unspecified: Secondary | ICD-10-CM | POA: Diagnosis not present

## 2022-11-04 DIAGNOSIS — L89224 Pressure ulcer of left hip, stage 4: Secondary | ICD-10-CM | POA: Diagnosis not present

## 2022-11-04 DIAGNOSIS — M8668 Other chronic osteomyelitis, other site: Secondary | ICD-10-CM | POA: Diagnosis not present

## 2022-11-04 DIAGNOSIS — L89214 Pressure ulcer of right hip, stage 4: Secondary | ICD-10-CM | POA: Diagnosis not present

## 2022-11-06 DIAGNOSIS — L89613 Pressure ulcer of right heel, stage 3: Secondary | ICD-10-CM | POA: Diagnosis not present

## 2022-11-06 DIAGNOSIS — Q059 Spina bifida, unspecified: Secondary | ICD-10-CM | POA: Diagnosis not present

## 2022-11-06 DIAGNOSIS — I82411 Acute embolism and thrombosis of right femoral vein: Secondary | ICD-10-CM | POA: Diagnosis not present

## 2022-11-06 DIAGNOSIS — L89214 Pressure ulcer of right hip, stage 4: Secondary | ICD-10-CM | POA: Diagnosis not present

## 2022-11-06 DIAGNOSIS — M8668 Other chronic osteomyelitis, other site: Secondary | ICD-10-CM | POA: Diagnosis not present

## 2022-11-06 DIAGNOSIS — G8220 Paraplegia, unspecified: Secondary | ICD-10-CM | POA: Diagnosis not present

## 2022-11-06 DIAGNOSIS — M009 Pyogenic arthritis, unspecified: Secondary | ICD-10-CM | POA: Diagnosis not present

## 2022-11-06 DIAGNOSIS — L89224 Pressure ulcer of left hip, stage 4: Secondary | ICD-10-CM | POA: Diagnosis not present

## 2022-11-06 DIAGNOSIS — L89154 Pressure ulcer of sacral region, stage 4: Secondary | ICD-10-CM | POA: Diagnosis not present

## 2022-11-07 ENCOUNTER — Telehealth: Payer: Self-pay

## 2022-11-07 NOTE — Progress Notes (Deleted)
Office Visit    Patient Name: Anthony [name] Date of Encounter: 11/07/2022  PCP:  Joya Salm   [facility]  Cardiologist:  [name], MD  Advanced Practice Provider:  No care team member to display Electrophysiologist:  Will Jorja Loa, MD    HPI    Anthony [name] is a 52 y.o. male with a past medical history significant for spina bifida wheelchair-bound and chronic indwelling catheter, chest pain, strong family history of CAD, remote history of smoking (quit about 10 years ago), HLD (LDL 134) presents today for follow-up appointment.  History includes send nuclear stress test showing no ischemia and 2D echo was normal.  Event monitor with 5 beat run of WCT and occasional PVCs.  Cardiac MRI was performed which shows normal LV size and wall thickness with EF 54%, normal RV size and systolic function EF 57% and no LGE to suggest scar.  Started on Toprol XL 25 mg daily.  He was seen by Dr. Mayford Knife 03/26/2021 and was doing well at that time.  Denied chest pain, shortness of breath, DOE, PND, orthopnea, and syncope.  Chronic pedal edema that had actually improved.  He says he was having some palpitations at night.  He rarely will wake himself up snoring.  He has never awakened gasping for air.  Compliant with all medications.  He was referred to EP due to PVC burden of 18%.  Dr. Elberta Fortis started him on flecainide.  He felt like he had an improvement in his palpitations with this medication.  He was seen by Francis Dowse, PA-C 07/05/2021 and at that time he was feeling pretty well with occasional dizziness with quick movements.  No lightheadedness, no syncope or near syncope.  No chest pain or shortness of breath.  He was last seen by me in December and  he stated he has been doing okay. He had some chest pain when he was in the nursing home that was sharp and on the left side of his chest. He was having some SOB and felt like his heart was jumping around. He has  been off his metoprolol and lisinopril because when he had an infection, his BP was low. Now, he is hypertensive and his HR is 100. We discussed ordering a lipid panel and a CBC to check his H and H.   I saw him 02/21/22, he comes in today worried about a low blood pressure.  When he was working physical therapy his blood pressure dropped to 100/60.  Not related to changing in position.  He has been dealing with his left hip and numerous surgeries for several months.  Most recent surgery was 3 to 4 months ago where they "cut too deep".  It ended up that his hip needed debrided several times.  When he left the hospital he passed out and his blood pressure was 60/40 so he monitors it closely at home.  His blood pressure is normal today at 128/68.  I do not think we need to make any medication adjustments today however, I have encouraged him to keep track of his blood pressure at home and if he has multiple readings under 100 on the top number to give [place] call.  He feels his chest pains have become more infrequent and they are usually sharp in nature and do not last long.  This is not usually typical of cardiac pain.  We will defer an ischemic workup for now.  No lightheadedness, dizziness,  syncope, or presyncope.  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 01/24/2017   Osteomyelitis (HCC)    left hip   Paraplegia (HCC)    secondary to Spina Bifida   Polymicrobial bacteremia with sepsis:    Pressure injury of skin of left buttock 05/13/2021   PVCs (premature ventricular contractions)    S/P debridement 10/30/2021   Septic shock (HCC)    due to  osteomyelitis   Slow transit constipation 03/06/2017   Spina bifida    Status post debridement 01/24/2017   Wheelchair bound    Past Surgical History:  Procedure Laterality Date   APPLICATION OF WOUND VAC  09/11/2021   Procedure: APPLICATION OF WOUND VAC;  Surgeon: [name], MD;  Location: [facility] OR;  Service: Orthopedics;;   BACK SURGERY     BUBBLE STUDY  09/09/2021   Procedure: BUBBLE STUDY;  Surgeon: Quintella Reichert, MD;  Location: [facility] ENDOSCOPY;  Service: Cardiovascular;;   HIP SURGERY     I & D EXTREMITY Left 09/11/2021   Procedure: LEFT HIP DEBRIDEMENT AND REMOVAL FEMORAL HEAD;  Surgeon: [name], MD;  Location: [facility] OR;  Service: Orthopedics;  Laterality: Left;   I & D EXTREMITY Left 09/13/2021   Procedure: DEBRIDEMENT LEFT HIP;  Surgeon: [name], MD;  Location: [facility] OR;  Service: Orthopedics;  Laterality: Left;   I & D EXTREMITY Left 09/18/2021   Procedure: DEBRIDEMENT LEFT HIP, WOUND VAC EXCHANGE;  Surgeon: [name], MD;  Location: [facility] OR;  Service: Orthopedics;  Laterality: Left;   I & D EXTREMITY Left 10/30/2021   Procedure: LEFT HIP DEBRIDEMENT WITH KERECIS PLACEMENT;  Surgeon: [name], MD;  Location: [facility] OR;  Service: Orthopedics;  Laterality: Left;   I & D EXTREMITY Bilateral 08/20/2022   Procedure: BILATERAL HIP DEBRIDEMENT;  Surgeon: [name], MD;  Location: [facility] OR;  Service: Orthopedics;  Laterality: Bilateral;   INCISION AND DRAINAGE OF WOUND N/A 01/24/2017   Procedure: IRRIGATION AND DEBRIDEMENT WOUND- BUTTOCK ABSCESS;  Surgeon: Almond Lint, MD;  Location: [facility] ORS;  Service: General;  Laterality: N/A;   LAPAROSCOPIC LOOP COLOSTOMY N/A 08/12/2022   Procedure: LAPAROSCOPIC LOOP SIGMOID COLOSTOMY;  Surgeon: Emelia Loron, MD;  Location: [facility] OR;  Service: General;  Laterality: N/A;   [name] WITHOUT CARDIOVERSION N/A 09/09/2021   Procedure: TRANSESOPHAGEAL ECHOCARDIOGRAM ([name]);  Surgeon: Quintella Reichert, MD;  Location: [facility] ENDOSCOPY;  Service:  Cardiovascular;  Laterality: N/A;   [name] WITHOUT CARDIOVERSION N/A 08/04/2022   Procedure: TRANSESOPHAGEAL ECHOCARDIOGRAM;  Surgeon: Chrystie Nose, MD;  Location: [facility] INVASIVE CV LAB;  Service: Cardiovascular;  Laterality: N/A;    Allergies  Allergies  Allergen Reactions   Firvanq [Vancomycin] Itching    EKGs/Labs/Other Studies Reviewed:   The following studies were reviewed today:  Echocardiogram 09/09/21  IMPRESSIONS     1. Left ventricular ejection fraction, by estimation, is 60 to 65%. The  left ventricle has normal function. The left ventricle has no regional  wall motion abnormalities.   2. Right ventricular systolic function is normal. The right  ventricular  size is normal.   3. Abnormal mitral valve with a small mobile density on the ventricular  side of the anterior mitral valve leaflet. In the setting of bacteremia,  cannot rule out vegetation. Mild mitral valve regurgitation. No evidence  of mitral stenosis.   4. The aortic valve is normal in structure. Aortic valve regurgitation is  trivial. No aortic stenosis is present.   5. The inferior vena cava is normal in size with greater than 50%  respiratory variability, suggesting right atrial pressure of 3 mmHg.   6. No left atrial/left atrial appendage thrombus was detected.   Conclusion(s)/Recommendation(s): Suspicious for possible vegetation on the  anterior mitral valve leaflet.   FINDINGS   Left Ventricle: Left ventricular ejection fraction, by estimation, is 60  to 65%. The left ventricle has normal function. The left ventricle has no  regional wall motion abnormalities. The left ventricular internal cavity  size was normal in size. There is   no left ventricular hypertrophy.   Right Ventricle: The right ventricular size is normal. No increase in  right ventricular wall thickness. Right ventricular systolic function is  normal.   Left Atrium: Left atrial size was normal in size. No left atrial/left   atrial appendage thrombus was detected.   Right Atrium: Right atrial size was normal in size.   Pericardium: There is no evidence of pericardial effusion.   Mitral Valve: Abnormal mitral valve with possible small density on the  ventricular side of the anterior mitral valve leaflet. In the setting of  bacteremia, cannot rule out vegetation. The mitral valve is abnormal. Mild  mitral valve regurgitation. There  is a unknown size present in the mitral position. No evidence of mitral  valve stenosis.   Tricuspid Valve: The tricuspid valve is normal in structure. Tricuspid  valve regurgitation is mild . No evidence of tricuspid stenosis.   Aortic Valve: The aortic valve is normal in structure. Aortic valve  regurgitation is trivial. No aortic stenosis is present.   Pulmonic Valve: The pulmonic valve was normal in structure. Pulmonic valve  regurgitation is trivial. No evidence of pulmonic stenosis.   Aorta: The aortic root is normal in size and structure.   Venous: The inferior vena cava is normal in size with greater than 50%  respiratory variability, suggesting right atrial pressure of 3 mmHg.   IAS/Shunts: There is redundancy of the interatrial septum. The interatrial  septum appears to be lipomatous. No atrial level shunt detected by color  flow Doppler.    Steffanie Dunn 12/2019 Study Highlights   Nuclear stress EF: 64%. The left ventricular ejection fraction is normal (55-65%). There was no ST segment deviation noted during stress. This is a low risk study. There is no evidence of ischemia or previous infarction . The study is normal.   2D echo 12/2019 IMPRESSIONS    1. Left ventricular ejection fraction, by estimation, is 50 to 55%. The  left ventricle has low normal function. The left ventricle has no regional  wall motion abnormalities. Left ventricular diastolic parameters were  normal.   2. Right ventricular systolic function is normal. The right ventricular   size is normal.   3. The mitral valve is normal in structure. Trivial mitral valve  regurgitation. No evidence of mitral stenosis.   4. The aortic valve is tricuspid. Aortic valve regurgitation is not  visualized. No aortic stenosis is present.   5. The inferior vena cava is normal in size with greater than 50%  respiratory  variability, suggesting right atrial pressure of 3 mmHg.    Event Monitor 12/2019 Study Highlights   Sinus Bradycardia, normal sinus rhythm and sinus tachycardia. The average heart rate was 95bpm and ranged from 50 to 135bpm. 1 run of wide complex tachycardia for 5 beats consistent with nonsustained ventricular tachycardia. Occasional PVCs  EKG:  EKG is not ordered today.     Recent Labs: 08/26/2022: Magnesium 2.0 09/11/2022: ALT 28; BUN 18; Creatinine, Ser 0.54; Hemoglobin 8.2; Platelets 552; Potassium 4.2; Sodium 139  Recent Lipid Panel    Component Value Date/Time   CHOL 109 02/21/2022 1326   TRIG 148 02/21/2022 1326   HDL 26 (L) 02/21/2022 1326   CHOLHDL 4.2 02/21/2022 1326   CHOLHDL 7.0 02/06/2014 1449   VLDL 43 (H) 02/06/2014 1449   LDLCALC 57 02/21/2022 1326    Home Medications   No outpatient medications have been marked as taking for the 11/10/22 encounter (Appointment) with Sharlene Dory, PA-C.     Review of Systems      All other systems reviewed and are otherwise negative except as noted above.  Physical Exam    VS:  There were no vitals taken for this visit. , BMI There is no height or weight on file to calculate BMI.  Wt Readings from Last 3 Encounters:  09/06/22 157 lb (71.2 kg)  09/01/22 182 lb 15.7 oz (83 kg)  02/21/22 175 lb (79.4 kg)     GEN: Well nourished, well developed, in no acute distress. HEENT: normal. Neck: Supple, no JVD, carotid bruits, or masses. Cardiac: RRR with occasional skipped beat, no murmurs, rubs, or gallops. No clubbing, cyanosis, no edema, left no edema.  Radials/PT 2+ and equal bilaterally.   Respiratory:  Respirations regular and unlabored, clear to auscultation bilaterally. GI: Soft, nontender, nondistended. Skin: Warm and dry, no rash. Neuro:  Strength and sensation are intact. Psych: Normal affect.  Assessment & Plan    Chest pain -sharp pains on the left side, have become more rare but still happening -would defer ischemic workup at this time since his symptoms are fleeting and he did not even pick up his nitro medication (and has not needed it) -sister, dad and mother have heart disease  HLD -Lipid panel ordered today -continue crestor 40mg  daily  Palpitations/NSVT/PVCs -Monitor 4/23 PVC burden was 18% therefore his metoprolol was increased to 50 mg twice daily and he was referred to EP -started on Flecainde with improvement, last saw  EP 06/2021  Hypertension -well controlled today -Continue current medication regimen with lisinopril/HCTZ and metoprolol succinate 50 mg twice daily -continue to monitor at home and send me those values   Disposition: Follow up 6 months  with [name], MD or APP.  Signed, Sharlene Dory, PA-C 11/07/2022, 1:18 PM [facility]

## 2022-11-07 NOTE — Telephone Encounter (Signed)
Meagan [facility] wound nurse with [facility] calls nurse line in regards to patient.   She reports they are discharging him from home health nursing as of next week.   She reports he has been non compliant with home health nursing visits. She reports he has been refusing to get out of his chair for wound care. She reports due to this she is unable to perform wound care. She reports he "just doesn't seem that interested in getting wound care."   She reports he has been receiving wound supplies and has been "getting by" changing his own dressing. She rpeorts he is happy to continue doing this.   Advised with forward to PCP.

## 2022-11-10 ENCOUNTER — Ambulatory Visit: Payer: Medicare HMO | Attending: Cardiology | Admitting: Physician Assistant

## 2022-11-10 DIAGNOSIS — I10 Essential (primary) hypertension: Secondary | ICD-10-CM

## 2022-11-10 DIAGNOSIS — E785 Hyperlipidemia, unspecified: Secondary | ICD-10-CM

## 2022-11-10 DIAGNOSIS — R002 Palpitations: Secondary | ICD-10-CM

## 2022-11-10 DIAGNOSIS — R079 Chest pain, unspecified: Secondary | ICD-10-CM

## 2022-11-10 DIAGNOSIS — I493 Ventricular premature depolarization: Secondary | ICD-10-CM

## 2022-11-11 ENCOUNTER — Encounter: Payer: Self-pay | Admitting: Physician Assistant

## 2022-11-11 ENCOUNTER — Encounter (HOSPITAL_BASED_OUTPATIENT_CLINIC_OR_DEPARTMENT_OTHER): Payer: Medicare HMO | Attending: General Surgery | Admitting: General Surgery

## 2022-11-14 NOTE — Telephone Encounter (Signed)
Anthony [name] [facility] RN with [facility] calls nurse line in regards to discharge.   She reports she was scheduled to go today for last visit and discharge.  She reports she contacted him to schedule a time, however he declined a visit due to his mother being back in the hospital.  She is requesting VO for next week 1 week 1.  VO given.

## 2022-11-17 ENCOUNTER — Ambulatory Visit: Payer: Medicare HMO | Admitting: Orthopedic Surgery

## 2022-11-18 DIAGNOSIS — Z933 Colostomy status: Secondary | ICD-10-CM | POA: Diagnosis not present

## 2022-11-18 DIAGNOSIS — L89224 Pressure ulcer of left hip, stage 4: Secondary | ICD-10-CM | POA: Diagnosis not present

## 2022-11-18 DIAGNOSIS — L89154 Pressure ulcer of sacral region, stage 4: Secondary | ICD-10-CM | POA: Diagnosis not present

## 2022-11-18 DIAGNOSIS — L89214 Pressure ulcer of right hip, stage 4: Secondary | ICD-10-CM | POA: Diagnosis not present

## 2022-11-18 DIAGNOSIS — L89612 Pressure ulcer of right heel, stage 2: Secondary | ICD-10-CM | POA: Diagnosis not present

## 2022-11-19 NOTE — Telephone Encounter (Signed)
Anthony [name] returns call to nurse line regarding discharging patient from home health.   She reports that she was scheduled to go out today, however, patient declined visit, as his mother recently passed.   She will attempt to reschedule visit for next week.   Verbal orders given.   FYI to PCP.   Anthony Prude, RN

## 2022-11-20 NOTE — Progress Notes (Deleted)
Electrophysiology Office Note:   Date:  11/20/2022  ID:  [name], DOB [birth year], MRN [id number]  Primary Cardiologist: [name], MD Electrophysiologist: Regan Lemming, MD  {Click to update primary MD,subspecialty MD or APP then REFRESH:1}    History of Present Illness:   MATILDE MONOPOLI is a 52 y.o. male with h/o spina bifida WC bound and chronic indwelling catheter, PVCs, and HTN seen today for routine electrophysiology followup.   Since last being seen in our clinic the patient reports doing ***.  he denies chest pain, palpitations, dyspnea, PND, orthopnea, nausea, vomiting, dizziness, syncope, edema, weight gain, or early satiety.   Review of systems complete and found to be negative unless listed in HPI.   EP Information / Studies Reviewed:    EKG is ordered today. Personal review as below.       04/25/2020: c.MRI IMPRESSION: 1. Normal LV size and wall thickness, with low normal systolic function (EF 54%) 2.  Normal RV size and systolic function (EF 57%) 3.  No late gadolinium enhancement to suggest myocardial scar   01/05/2020: stress myoview Nuclear stress EF: 64%. The left ventricular ejection fraction is normal (55-65%). There was no ST segment deviation noted during stress. This is a low risk study. There is no evidence of ischemia or previous infarction . The study is normal.    01/03/2020: TTE  1. Left ventricular ejection fraction, by estimation, is 50 to 55%. The  left ventricle has low normal function. The left ventricle has no regional  wall motion abnormalities. Left ventricular diastolic parameters were  normal.   2. Right ventricular systolic function is normal. The right ventricular  size is normal.   3. The mitral valve is normal in structure. Trivial mitral valve  regurgitation. No evidence of mitral stenosis.   4. The aortic valve is tricuspid. Aortic valve regurgitation is not  visualized. No aortic stenosis is present.   5. The inferior  vena cava is normal in size with greater than 50% respiratory variability, suggesting right atrial pressure of 3 mmHg.   Physical Exam:   VS:  There were no vitals taken for this visit.   Wt Readings from Last 3 Encounters:  09/06/22 157 lb (71.2 kg)  09/01/22 182 lb 15.7 oz (83 kg)  02/21/22 175 lb (79.4 kg)     GEN: Well nourished, well developed in no acute distress NECK: No JVD; No carotid bruits CARDIAC: {EPRHYTHM:28826}, no murmurs, rubs, gallops RESPIRATORY:  Clear to auscultation without rales, wheezing or rhonchi  ABDOMEN: Soft, non-tender, non-distended EXTREMITIES:  No edema; No deformity   ASSESSMENT AND PLAN:    PVCs EKG today shows *** Continue flecainide 50 mg BID Continue toprol 50 mg BID  HTN Stable on current regimen   H/o DVT Treated ***  {Click here to Review PMH, Prob List, Meds, Allergies, SHx, FHx  :1}   Follow up with {WUJWJ:19147} {EPFOLLOW WG:95621}  Signed, Graciella Freer, PA-C

## 2022-11-21 ENCOUNTER — Ambulatory Visit: Payer: Medicare HMO | Attending: Student | Admitting: Student

## 2022-11-21 DIAGNOSIS — L89214 Pressure ulcer of right hip, stage 4: Secondary | ICD-10-CM | POA: Diagnosis not present

## 2022-11-21 DIAGNOSIS — L89612 Pressure ulcer of right heel, stage 2: Secondary | ICD-10-CM | POA: Diagnosis not present

## 2022-11-21 DIAGNOSIS — Z86718 Personal history of other venous thrombosis and embolism: Secondary | ICD-10-CM

## 2022-11-21 DIAGNOSIS — Z933 Colostomy status: Secondary | ICD-10-CM | POA: Diagnosis not present

## 2022-11-21 DIAGNOSIS — I10 Essential (primary) hypertension: Secondary | ICD-10-CM

## 2022-11-21 DIAGNOSIS — I493 Ventricular premature depolarization: Secondary | ICD-10-CM

## 2022-11-21 DIAGNOSIS — Z419 Encounter for procedure for purposes other than remedying health state, unspecified: Secondary | ICD-10-CM | POA: Diagnosis not present

## 2022-11-21 DIAGNOSIS — L89154 Pressure ulcer of sacral region, stage 4: Secondary | ICD-10-CM | POA: Diagnosis not present

## 2022-11-24 ENCOUNTER — Encounter: Payer: Self-pay | Admitting: Student

## 2022-11-28 NOTE — Telephone Encounter (Signed)
Received returned call from [place] regarding patient discharge.   She reports that patient refused visit for discharge this week.   Veronda Prude, RN

## 2022-11-30 ENCOUNTER — Other Ambulatory Visit: Payer: Self-pay | Admitting: Cardiology

## 2022-12-01 ENCOUNTER — Ambulatory Visit: Payer: Medicare HMO | Admitting: Orthopedic Surgery

## 2022-12-08 DIAGNOSIS — L89612 Pressure ulcer of right heel, stage 2: Secondary | ICD-10-CM | POA: Diagnosis not present

## 2022-12-08 DIAGNOSIS — Z933 Colostomy status: Secondary | ICD-10-CM | POA: Diagnosis not present

## 2022-12-08 DIAGNOSIS — L89154 Pressure ulcer of sacral region, stage 4: Secondary | ICD-10-CM | POA: Diagnosis not present

## 2022-12-08 DIAGNOSIS — L89214 Pressure ulcer of right hip, stage 4: Secondary | ICD-10-CM | POA: Diagnosis not present

## 2022-12-09 ENCOUNTER — Ambulatory Visit (INDEPENDENT_AMBULATORY_CARE_PROVIDER_SITE_OTHER): Payer: Medicare HMO | Admitting: Orthopedic Surgery

## 2022-12-09 DIAGNOSIS — L97111 Non-pressure chronic ulcer of right thigh limited to breakdown of skin: Secondary | ICD-10-CM | POA: Diagnosis not present

## 2022-12-09 DIAGNOSIS — L89220 Pressure ulcer of left hip, unstageable: Secondary | ICD-10-CM | POA: Diagnosis not present

## 2022-12-22 ENCOUNTER — Encounter: Payer: Self-pay | Admitting: Orthopedic Surgery

## 2022-12-22 NOTE — Progress Notes (Signed)
Office Visit Note   Patient: Anthony [name]           Date of Birth: [birth year]           MRN: [id number] Visit Date: 12/09/2022              Requested by: Erick Alley, DO [address] [place],  [place] [address] PCP: Erick Alley, DO  Chief Complaint  Patient presents with   Right Hip - Follow-up    08/20/22 bilateral hip debridement.   Left Hip - Follow-up      HPI: Patient is a 52 year old gentleman who is seen for bilateral hip ulcerations.  Patient underwent most recent debridement July 31.  This is patient's first postoperative visit.  Patient states he was using Dakin's solution for the wounds.  Assessment & Plan: Visit Diagnoses:  1. Decubitus ulcer of hip, left, unstageable (HCC)   2. Ulcer of trochanteric region of right hip, limited to breakdown of skin [facility])     Plan: Patient will continue to pack the wounds open at home.  Recommended Vashe for packing.  Patient also has a large sacral decubitus ulcer recommended plastic surgery for evaluation.  Follow-Up Instructions: Return in about 4 weeks (around 01/06/2023).   Ortho Exam  Patient is alert, oriented, no adenopathy, well-dressed, normal affect, normal respiratory effort. Examination of both hips there is healthy granulation tissue.  No cellulitis there is tunneling.  Imaging: No results found.    Labs: Lab Results  Component Value Date   ESRSEDRATE 120 (H) 09/06/2022   ESRSEDRATE 140 (H) 08/06/2022   CRP 20.2 (H) 09/06/2022   CRP 16.4 (H) 08/06/2022   REPTSTATUS 09/09/2022 FINAL 09/08/2022   GRAMSTAIN  09/06/2022    FEW WBC PRESENT, PREDOMINANTLY MONONUCLEAR FEW GRAM POSITIVE COCCI IN PAIRS FEW GRAM NEGATIVE RODS Performed at [facility], [address], [place], [place] [address]    CULT  09/08/2022    NO GROWTH Performed at [facility], [address], [place], [place] [address]    [facility] ENTEROCOCCUS FAECIUM 08/20/2022     Lab Results  Component Value Date   ALBUMIN  2.1 (L) 09/11/2022   ALBUMIN 2.1 (L) 09/10/2022   ALBUMIN 2.0 (L) 09/09/2022    Lab Results  Component Value Date   MG 2.0 08/26/2022   MG 2.2 08/21/2022   MG 1.9 08/20/2022   Lab Results  Component Value Date   VD25OH 42.36 08/06/2022    No results found for: "PREALBUMIN"    Latest Ref Rng & Units 09/11/2022    7:54 AM 09/10/2022    3:25 AM 09/09/2022    3:29 AM  CBC EXTENDED  WBC 4.0 - 10.5 K/uL 9.6  10.3  9.3   RBC 4.22 - 5.81 MIL/uL 3.55  3.44  3.37   Hemoglobin 13.0 - 17.0 g/dL 8.2  7.9  8.0   HCT 21.3 - 52.0 % 28.4  27.3  27.0   Platelets 150 - 400 K/uL 552  560  556   NEUT# 1.7 - 7.7 K/uL 5.5  5.6  5.2   Lymph# 0.7 - 4.0 K/uL 1.5  2.0  1.8      There is no height or weight on file to calculate BMI.  Orders:  No orders of the defined types were placed in this encounter.  No orders of the defined types were placed in this encounter.    Procedures: No procedures performed  Clinical Data: No additional findings.  ROS:  All other systems negative, except as noted in the HPI. Review of Systems  Objective: Vital Signs: There were no vitals taken for this visit.  Specialty Comments:  No specialty comments available.  PMFS History: Patient Active Problem List   Diagnosis Date Noted   Thrombophilia (HCC) 09/08/2022   Advanced Directives in ACP section 09/07/2022   Polymicrobial bacterial infection 08/28/2022   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 08/16/2022   Pressure injury, stage 4, with infection (HCC) 08/05/2022   Acute deep vein thrombosis (DVT) of axillary vein of right upper extremity (HCC) 08/02/2022   Anemia of infection and chronic disease 07/26/2022   Acute osteomyelitis of pelvic region and thigh (HCC) 07/26/2022   Chronic osteomyelitis of pelvic region (HCC) 07/26/2022   Febrile illness 02/27/2022   Pyogenic arthritis of left hip (HCC)    Septic arthritis of bilateral hips (HCC) 09/03/2021   Neurogenic bladder 03/06/2017   Suprapubic catheter (HCC)  03/06/2017   Paraplegia (HCC) 01/29/2017   Decubitus ulcer due to spina bifida (HCC) 01/27/2017   Hyperlipidemia 03/11/2014   Spina bifida of lumbar region (HCC) 09/30/2012   Acute Right shoulder pain 01/27/2011   Allergic rhinitis 06/01/2007   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   VENOUS INSUFFICIENCY, CHRONIC 03/19/2006   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 01/24/2017   Osteomyelitis (HCC)    left hip   Paraplegia (HCC)    secondary to Spina Bifida   Polymicrobial bacteremia with sepsis:    Pressure injury of skin of left buttock 05/13/2021   PVCs (premature ventricular contractions)    S/P debridement 10/30/2021   Septic shock (HCC)    due to osteomyelitis   Slow transit constipation 03/06/2017   Spina bifida    Status post debridement 01/24/2017   Wheelchair bound     Family History  Problem Relation Age of Onset   CAD Father        died age 8 from MI   Sudden Cardiac Death Sister        age 33   CAD Maternal Grandfather        died age 69 MI    Past Surgical History:  Procedure Laterality Date   APPLICATION OF WOUND VAC  09/11/2021   Procedure: APPLICATION OF WOUND VAC;  Surgeon: [name], MD;  [facility]: [facility] OR;  Service: Orthopedics;;   BACK SURGERY     BUBBLE STUDY  09/09/2021   Procedure: BUBBLE STUDY;  Surgeon: Quintella Reichert, MD;  Location: [facility] ENDOSCOPY;  Service: Cardiovascular;;   HIP SURGERY     I & D EXTREMITY Left 09/11/2021   Procedure: LEFT HIP DEBRIDEMENT AND REMOVAL FEMORAL HEAD;  Surgeon: [name], MD;  Location: [facility] OR;  Service: Orthopedics;  Laterality: Left;   I & D EXTREMITY Left 09/13/2021   Procedure: DEBRIDEMENT  LEFT HIP;  Surgeon: [name], MD;  Location: [facility] OR;  Service: Orthopedics;  Laterality: Left;   I & D EXTREMITY Left 09/18/2021   Procedure: DEBRIDEMENT LEFT HIP, WOUND VAC EXCHANGE;  Surgeon: [name], MD;  Location: [facility] OR;  Service: Orthopedics;  Laterality: Left;  I & D EXTREMITY Left 10/30/2021   Procedure: LEFT HIP DEBRIDEMENT WITH KERECIS PLACEMENT;  Surgeon: [name], MD;  Location: [facility] OR;  Service: Orthopedics;  Laterality: Left;   I & D EXTREMITY Bilateral 08/20/2022   Procedure: BILATERAL HIP DEBRIDEMENT;  Surgeon: [name], MD;  Location[facility] OR;  Service: Orthopedics;  Laterality: Bilateral;   INCISION AND DRAINAGE OF WOUND N/A 01/24/2017   Procedure: IRRIGATION AND DEBRIDEMENT WOUND- BUTTOCK ABSCESS;  Surgeon: Almond Lint, MD;  Location: [facility] ORS;  Service: General;  Laterality: N/A;   LAPAROSCOPIC LOOP COLOSTOMY N/A 08/12/2022   Procedure: LAPAROSCOPIC LOOP SIGMOID COLOSTOMY;  Surgeon: Emelia Loron, MD;  Location: [facility] OR;  Service: General;  Laterality: N/A;   [name] WITHOUT CARDIOVERSION N/A 09/09/2021   Procedure: TRANSESOPHAGEAL ECHOCARDIOGRAM ([name]);  Surgeon: Quintella Reichert, MD;  Location: [facility] ENDOSCOPY;  Service: Cardiovascular;  Laterality: N/A;   [name] WITHOUT CARDIOVERSION N/A 08/04/2022   Procedure: TRANSESOPHAGEAL ECHOCARDIOGRAM;  Surgeon: Chrystie Nose, MD;  [facility] INVASIVE CV LAB;  Service: Cardiovascular;  Laterality: N/A;   Social History   Occupational History   Not on file  Tobacco Use   Smoking status: Former    Types: Cigarettes    Passive exposure: Past   Smokeless tobacco: Never  Vaping Use   Vaping status: Never Used  Substance and Sexual Activity   Alcohol use: No   Drug use: Not Currently   Sexual activity: Not Currently

## 2022-12-31 ENCOUNTER — Ambulatory Visit: Payer: Medicare HMO | Admitting: Pulmonary Disease

## 2022-12-31 ENCOUNTER — Ambulatory Visit: Payer: Medicare HMO | Attending: Pulmonary Disease | Admitting: Pulmonary Disease

## 2022-12-31 ENCOUNTER — Encounter: Payer: Self-pay | Admitting: Pulmonary Disease

## 2022-12-31 VITALS — BP 100/58 | HR 98 | Wt 165.0 lb

## 2022-12-31 DIAGNOSIS — R079 Chest pain, unspecified: Secondary | ICD-10-CM | POA: Diagnosis not present

## 2022-12-31 DIAGNOSIS — I493 Ventricular premature depolarization: Secondary | ICD-10-CM | POA: Diagnosis not present

## 2022-12-31 DIAGNOSIS — E785 Hyperlipidemia, unspecified: Secondary | ICD-10-CM | POA: Diagnosis not present

## 2022-12-31 DIAGNOSIS — R002 Palpitations: Secondary | ICD-10-CM | POA: Diagnosis not present

## 2022-12-31 MED ORDER — FLECAINIDE ACETATE 100 MG PO TABS: 100.0000 mg | ORAL_TABLET | Freq: Two times a day (BID) | ORAL | 3 refills | Status: AC

## 2022-12-31 NOTE — Progress Notes (Unsigned)
  Electrophysiology Office Note:   Date:  12/31/2022  ID:  Anthony [name], DOB 12/25/1970, MRN [id number]  Primary Cardiologist: [name], MD Electrophysiologist: Regan Lemming, MD  {Click to update primary MD,subspecialty MD or APP then REFRESH:1}    History of Present Illness:   Anthony [name] is a 52 y.o. male with h/o PVC's, HTN, HLD, wheelchair bound in setting of spina bifida, chronic indwelling catheter seen today for routine electrophysiology followup.   Since last being seen in our clinic the patient reports doing ***.  he denies chest pain, palpitations, dyspnea, PND, orthopnea, nausea, vomiting, dizziness, syncope, edema, weight gain, or early satiety.   Review of systems complete and found to be negative unless listed in HPI.   EP Information / Studies Reviewed:    EKG is ordered today. Personal review as below.      Studies:  Lexiscan 12/2019 > LVEF 64%, no ST segement deviation noted during stress, low risk study, no evidence of ischemia or prior infarction Cardiac Monitor 12/2019 > SB, NSR, ST.  Ave HR 95 bpm, range from 50-135 bpm, 1 run of WCT for 5 beats c/w NSVT, occ PVC's cMRI 04/2020 > normal LV size & wall thickness, low normal systolic function, LVEF 54%, normal RV size & systolic function, no LGE Cardiac Monitor 04/2021 > predominant rhythm NSR, ave HR 78 bpm, range 48-132 bpm, frequent PVC's, ventricular couplets, bigeminal / trigeminal PVC's with PVC load of 18%, atrial triplet & non-sustained atrial tachycardia with episode lasting 17 beats, max HR of 169 bpm   Arrhythmia / AAD PVC's > started on metoprolol 2021, increased in 2023   Flecainide 05/2021   Risk Assessment/Calculations:     No BP recorded.  {Refresh Note OR Click here to enter BP  :1}***        Physical Exam:   VS:  There were no vitals taken for this visit.   Wt Readings from Last 3 Encounters:  09/06/22 157 lb (71.2 kg)  09/01/22 182 lb 15.7 oz (83 kg)  02/21/22 175 lb (79.4  kg)     GEN: Well nourished, well developed in no acute distress NECK: No JVD; No carotid bruits CARDIAC: {EPRHYTHM:28826}, no murmurs, rubs, gallops RESPIRATORY:  Clear to auscultation without rales, wheezing or rhonchi  ABDOMEN: Soft, non-tender, non-distended EXTREMITIES:  No edema; No deformity   ASSESSMENT AND PLAN:    PVC's  High burden on prior cardiac monitor, 18% with symptoms of palpitations.  -continue Toprol 50 mg BID  -continue flecainide 50 mg BID  -EKG with stable intervals ***  Hx DVT RUE  -eliquis 5mg  BID, appropriate by wt/age   Follow up with {ONGEX:52841} {EPFOLLOW LK:44010}  Signed, Canary Brim, MSN, APRN, NP-C, AGACNP-BC [facility] - Electrophysiology  12/31/2022, 8:37 AM

## 2022-12-31 NOTE — Progress Notes (Signed)
Electrophysiology Office Note:   Date:  12/31/2022  ID:  Anthony [name], DOB May 27, 1970, MRN [id number]  Primary Cardiologist: [name], MD Electrophysiologist: Regan Lemming, MD      History of Present Illness:   Anthony [name] is a 52 y.o. male with h/o PVC's, HTN, HLD, wheelchair bound in setting of spina bifida, chronic indwelling catheter seen today for routine electrophysiology followup.   Since last being seen in our clinic the patient reports his mother passed away in 11/23/22. He had been living independently but went to SNF rehab after falling out of his chair due to hypotension in the setting of sepsis. He required hospital admit from 8/17-8/22/24.  He lost his apartment while he was living in the facility / mother's home and now is staying at her place. He notes he is aware of PVC's at times -describes it as an irregular beat from time to time. Has been taking his flecainide and Toprol. Describes numbness in his fingers at times (bilaterally in the radial distribution).  He denies chest pain, palpitations, dyspnea, PND, orthopnea, nausea, vomiting, dizziness, syncope, edema, weight gain, or early satiety.   Review of systems complete and found to be negative unless listed in HPI.   EP Information / Studies Reviewed:    EKG is ordered today. Personal review as below.  EKG Interpretation Date/Time:  Wednesday December 31 2022 15:42:03 EST Ventricular Rate:  98 PR Interval:  122 QRS Duration:  78 QT Interval:  378 QTC Calculation: 482 R Axis:   -2  Text Interpretation: Sinus rhythm with frequent Premature ventricular complexes Confirmed by Canary Brim ([id number]) on 12/31/2022 3:48:56 PM   Studies:  Eugenie Birks 12/2019 > LVEF 64%, no ST segement deviation noted during stress, low risk study, no evidence of ischemia or prior infarction Cardiac Monitor 12/2019 > SB, NSR, ST.  Ave HR 95 bpm, range from 50-135 bpm, 1 run of WCT for 5 beats c/w NSVT, occ PVC's cMRI 04/2020  > normal LV size & wall thickness, low normal systolic function, LVEF 54%, normal RV size & systolic function, no LGE Cardiac Monitor 04/2021 > predominant rhythm NSR, ave HR 78 bpm, range 48-132 bpm, frequent PVC's, ventricular couplets, bigeminal / trigeminal PVC's with PVC load of 18%, atrial triplet & non-sustained atrial tachycardia with episode lasting 17 beats, max HR of 169 bpm   Arrhythmia / AAD PVC's > started on metoprolol 2021, increased in 2023   Flecainide 05/2021       Physical Exam:   VS:  BP (!) 100/58   Pulse 98   Wt 165 lb (74.8 kg)   SpO2 97%   BMI 25.09 kg/m    Wt Readings from Last 3 Encounters:  12/31/22 165 lb (74.8 kg)  09/06/22 157 lb (71.2 kg)  09/01/22 182 lb 15.7 oz (83 kg)     GEN: chronically ill appearing adult male in wheelchair, well developed in no acute distress NECK: No JVD; No carotid bruits CARDIAC: Regular rate and rhythm, occ ectopy, no murmurs, rubs, gallops RESPIRATORY:  Clear to auscultation without rales, wheezing or rhonchi  ABDOMEN: Soft, non-tender, non-distended EXTREMITIES:  No edema; No deformity   ASSESSMENT AND PLAN:    PVC's  High burden on prior cardiac monitor, 18% with symptoms of palpitations.  -continue Toprol 50 mg BID  -increase flecainide to 100 mg BID  -follow up EKG in one week with RN -EKG with stable intervals but continues to have PVC's every 5th beat   Hx DVT  RUE  -He completed his Eliquis from 07/2022 - 10/2022  -now off   Finger Numbness -encouraged patient to follow up with PCP -distribution on exam consistent with radial distribution  -query if related to his use of his wheelchair / mechanical injury, defer to primary   Follow up with EP APP  4 months    Signed, Canary Brim, MSN, APRN, NP-C, AGACNP-BC [facility] - Electrophysiology  12/31/2022, 4:43 PM

## 2022-12-31 NOTE — Patient Instructions (Addendum)
Medication Instructions:  Your physician has recommended you make the following change in your medication:  Increase flecainide to 100mg  twice daily.  Lab Work: None ordered.  If you have labs (blood work) drawn today and your tests are completely normal, you will receive your results only by: MyChart Message (if you have MyChart) OR A paper copy in the mail If you have any lab test that is abnormal or we need to change your treatment, we will call you to review the results.  Testing/Procedures: None ordered.  Follow-Up: At [facility], you and your health needs are our priority.  As part of our continuing mission to provide you with exceptional heart care, we have created designated Provider Care Teams.  These Care Teams include your primary Cardiologist (physician) and Advanced Practice Providers (APPs -  Physician Assistants and Nurse Practitioners) who all work together to provide you with the care you need, when you need it.   Your next appointment:   1 week for EKG 4 months for follow up  The format for your next appointment:   In Person  Provider:   Dr Camnitz{or one of the following Advanced Practice Providers on your designated Care Team:   Francis Dowse, PA-C [facility] [address] [place], [place] Earnest Rosier, NP    Important Information About Sugar

## 2023-01-04 DIAGNOSIS — L89154 Pressure ulcer of sacral region, stage 4: Secondary | ICD-10-CM | POA: Diagnosis not present

## 2023-01-04 DIAGNOSIS — Z933 Colostomy status: Secondary | ICD-10-CM | POA: Diagnosis not present

## 2023-01-04 DIAGNOSIS — L89224 Pressure ulcer of left hip, stage 4: Secondary | ICD-10-CM | POA: Diagnosis not present

## 2023-01-04 DIAGNOSIS — L89214 Pressure ulcer of right hip, stage 4: Secondary | ICD-10-CM | POA: Diagnosis not present

## 2023-01-04 DIAGNOSIS — L89612 Pressure ulcer of right heel, stage 2: Secondary | ICD-10-CM | POA: Diagnosis not present

## 2023-01-06 ENCOUNTER — Ambulatory Visit (INDEPENDENT_AMBULATORY_CARE_PROVIDER_SITE_OTHER): Payer: Medicare HMO | Admitting: Orthopedic Surgery

## 2023-01-06 ENCOUNTER — Encounter: Payer: Self-pay | Admitting: Orthopedic Surgery

## 2023-01-06 DIAGNOSIS — L97111 Non-pressure chronic ulcer of right thigh limited to breakdown of skin: Secondary | ICD-10-CM

## 2023-01-06 DIAGNOSIS — L89220 Pressure ulcer of left hip, unstageable: Secondary | ICD-10-CM

## 2023-01-06 NOTE — Progress Notes (Signed)
Office Visit Note   Patient: Anthony [name]           Date of Birth: 04-11-1970           MRN: [id number] Visit Date: 01/06/2023              Requested by: Erick Alley, DO [address] [place],  [place] [address] PCP: Erick Alley, DO  Chief Complaint  Patient presents with   Right Hip - Wound Check   Left Hip - Wound Check      HPI: Patient is a 52 year old gentleman seen in follow-up for bilateral sacral and bilateral hip ulcers.  Patient states that he recently has obtained an air mattress.  Patient states that he is using Dakin's solution for the wounds.  Assessment & Plan: Visit Diagnoses:  1. Decubitus ulcer of hip, left, unstageable (HCC)   2. Ulcer of trochanteric region of right hip, limited to breakdown of skin (HCC)     Plan: Continue current wound care continue to pack the hip wounds open.  Follow-Up Instructions: Return in about 4 weeks (around 02/03/2023).   Ortho Exam  Patient is alert, oriented, no adenopathy, well-dressed, normal affect, normal respiratory effort. Examination patient has healthy granulation tissue lateral sacral ulcers.  The hip ulcers continue to improve with healthy granulation tissue at the base.  He has been packing his open.  The right hip wound is almost completely closed.  Imaging: No results found. No images are attached to the encounter.  Labs: Lab Results  Component Value Date   ESRSEDRATE 120 (H) 09/06/2022   ESRSEDRATE 140 (H) 08/06/2022   CRP 20.2 (H) 09/06/2022   CRP 16.4 (H) 08/06/2022   REPTSTATUS 09/09/2022 FINAL 09/08/2022   GRAMSTAIN  09/06/2022    FEW WBC PRESENT, PREDOMINANTLY MONONUCLEAR FEW GRAM POSITIVE COCCI IN PAIRS FEW GRAM NEGATIVE RODS Performed at [facility], [address] [place], [place] [address]    CULT  09/08/2022    NO GROWTH Performed at [facility], [address], [place], [place] [address]    [facility] ENTEROCOCCUS FAECIUM 08/20/2022     Lab Results  Component  Value Date   ALBUMIN 2.1 (L) 09/11/2022   ALBUMIN 2.1 (L) 09/10/2022   ALBUMIN 2.0 (L) 09/09/2022    Lab Results  Component Value Date   MG 2.0 08/26/2022   MG 2.2 08/21/2022   MG 1.9 08/20/2022   Lab Results  Component Value Date   VD25OH 42.36 08/06/2022    No results found for: "PREALBUMIN"    Latest Ref Rng & Units 09/11/2022    7:54 AM 09/10/2022    3:25 AM 09/09/2022    3:29 AM  CBC EXTENDED  WBC 4.0 - 10.5 K/uL 9.6  10.3  9.3   RBC 4.22 - 5.81 MIL/uL 3.55  3.44  3.37   Hemoglobin 13.0 - 17.0 g/dL 8.2  7.9  8.0   HCT 56.4 - 52.0 % 28.4  27.3  27.0   Platelets 150 - 400 K/uL 552  560  556   NEUT# 1.7 - 7.7 K/uL 5.5  5.6  5.2   Lymph# 0.7 - 4.0 K/uL 1.5  2.0  1.8      There is no height or weight on file to calculate BMI.  Orders:  No orders of the defined types were placed in this encounter.  No orders of the defined types were placed in this encounter.    Procedures: No procedures performed  Clinical Data: No additional findings.  ROS:  All other systems negative, except as noted in the HPI. Review of Systems  Objective: Vital Signs: There were no vitals taken for this visit.  Specialty Comments:  No specialty comments available.  PMFS History: Patient Active Problem List   Diagnosis Date Noted   Thrombophilia (HCC) 09/08/2022   Advanced Directives in ACP section 09/07/2022   Polymicrobial bacterial infection 08/28/2022   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 08/16/2022   Pressure injury, stage 4, with infection (HCC) 08/05/2022   Acute deep vein thrombosis (DVT) of axillary vein of right upper extremity (HCC) 08/02/2022   Anemia of infection and chronic disease 07/26/2022   Acute osteomyelitis of pelvic region and thigh (HCC) 07/26/2022   Chronic osteomyelitis of pelvic region (HCC) 07/26/2022   Febrile illness 02/27/2022   Pyogenic arthritis of left hip (HCC)    Septic arthritis of bilateral hips (HCC) 09/03/2021   Neurogenic bladder 03/06/2017    Suprapubic catheter (HCC) 03/06/2017   Paraplegia (HCC) 01/29/2017   Decubitus ulcer due to spina bifida (HCC) 01/27/2017   Hyperlipidemia 03/11/2014   Spina bifida of lumbar region (HCC) 09/30/2012   Acute Right shoulder pain 01/27/2011   Allergic rhinitis 06/01/2007   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   VENOUS INSUFFICIENCY, CHRONIC 03/19/2006   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 01/24/2017   Osteomyelitis (HCC)    left hip   Paraplegia (HCC)    secondary to Spina Bifida   Polymicrobial bacteremia with sepsis:    Pressure injury of skin of left buttock 05/13/2021   PVCs (premature ventricular contractions)    S/P debridement 10/30/2021   Septic shock (HCC)    due to osteomyelitis   Slow transit constipation 03/06/2017   Spina bifida    Status post debridement 01/24/2017   Wheelchair bound     Family History  Problem Relation Age of Onset   CAD Father        died age 61 from MI   Sudden Cardiac Death Sister        age 71   CAD Maternal Grandfather        died age 5 MI    Past Surgical History:  Procedure Laterality Date   APPLICATION OF WOUND VAC  09/11/2021   Procedure: APPLICATION OF WOUND VAC;  Surgeon: [name], MD;  Location: [facility] OR;  Service: Orthopedics;;   BACK SURGERY     BUBBLE STUDY  09/09/2021   Procedure: BUBBLE STUDY;  Surgeon: Quintella Reichert, MD;  Location: [facility] ENDOSCOPY;  Service: Cardiovascular;;   HIP SURGERY     I & D EXTREMITY Left 09/11/2021   Procedure: LEFT HIP DEBRIDEMENT AND REMOVAL FEMORAL HEAD;  Surgeon: [name], MD;  Location: [facility] OR;  Service: Orthopedics;  Laterality: Left;   I & D EXTREMITY Left 09/13/2021    Procedure: DEBRIDEMENT LEFT HIP;  Surgeon: [name], MD;  Location: [facility] OR;  Service: Orthopedics;  Laterality: Left;   I & D EXTREMITY Left 09/18/2021   Procedure: DEBRIDEMENT LEFT HIP, WOUND VAC EXCHANGE;  Surgeon: [name], MD;  Location: [facility] OR;  Service: Orthopedics;  Laterality: Left;   I & D EXTREMITY Left 10/30/2021   Procedure: LEFT HIP DEBRIDEMENT WITH KERECIS PLACEMENT;  Surgeon: [name], MD;  Location: [facility] OR;  Service: Orthopedics;  Laterality: Left;   I & D EXTREMITY Bilateral 08/20/2022   Procedure: BILATERAL HIP DEBRIDEMENT;  Surgeon: [name], MD;  Location: [facility] OR;  Service: Orthopedics;  Laterality: Bilateral;   INCISION AND DRAINAGE OF WOUND N/A 01/24/2017   Procedure: IRRIGATION AND DEBRIDEMENT WOUND- BUTTOCK ABSCESS;  Surgeon: Almond Lint, MD;  Location: [facility] ORS;  Service: General;  Laterality: N/A;   LAPAROSCOPIC LOOP COLOSTOMY N/A 08/12/2022   Procedure: LAPAROSCOPIC LOOP SIGMOID COLOSTOMY;  Surgeon: Emelia Loron, MD;  Location: [facility] OR;  Service: General;  Laterality: N/A;   [name] WITHOUT CARDIOVERSION N/A 09/09/2021   Procedure: TRANSESOPHAGEAL ECHOCARDIOGRAM ([name]);  Surgeon: Quintella Reichert, MD;  Location: [facility] ENDOSCOPY;  Service: Cardiovascular;  Laterality: N/A;   [name] WITHOUT CARDIOVERSION N/A 08/04/2022   Procedure: TRANSESOPHAGEAL ECHOCARDIOGRAM;  Surgeon: Chrystie Nose, MD;  Location: [facility] INVASIVE CV LAB;  Service: Cardiovascular;  Laterality: N/A;   Social History   Occupational History   Not on file  Tobacco Use   Smoking status: Former    Types: Cigarettes    Passive exposure: Past   Smokeless tobacco: Never  Vaping Use   Vaping status: Never Used  Substance and Sexual Activity   Alcohol use: No   Drug use: Not Currently   Sexual activity: Not Currently

## 2023-01-08 ENCOUNTER — Telehealth: Payer: Self-pay | Admitting: Pulmonary Disease

## 2023-01-08 ENCOUNTER — Ambulatory Visit: Payer: Medicare HMO

## 2023-01-08 DIAGNOSIS — L89214 Pressure ulcer of right hip, stage 4: Secondary | ICD-10-CM | POA: Diagnosis not present

## 2023-01-08 DIAGNOSIS — L89154 Pressure ulcer of sacral region, stage 4: Secondary | ICD-10-CM | POA: Diagnosis not present

## 2023-01-08 DIAGNOSIS — Z933 Colostomy status: Secondary | ICD-10-CM | POA: Diagnosis not present

## 2023-01-08 DIAGNOSIS — L89224 Pressure ulcer of left hip, stage 4: Secondary | ICD-10-CM | POA: Diagnosis not present

## 2023-01-08 DIAGNOSIS — L89612 Pressure ulcer of right heel, stage 2: Secondary | ICD-10-CM | POA: Diagnosis not present

## 2023-01-08 NOTE — Telephone Encounter (Signed)
Pt rescheduled to Monday for nurse visit EKG

## 2023-01-08 NOTE — Telephone Encounter (Signed)
Pt cx NURSE VISIT TODAY 912/19/24) PT NEEDS TO BE CALLED TO R/S

## 2023-01-12 ENCOUNTER — Ambulatory Visit: Payer: Medicare HMO

## 2023-01-15 ENCOUNTER — Ambulatory Visit: Payer: Medicare HMO | Attending: Internal Medicine

## 2023-01-15 VITALS — BP 103/61 | HR 93 | Ht 68.0 in | Wt 165.0 lb

## 2023-01-15 DIAGNOSIS — Z79899 Other long term (current) drug therapy: Secondary | ICD-10-CM

## 2023-01-15 DIAGNOSIS — Z5181 Encounter for therapeutic drug level monitoring: Secondary | ICD-10-CM

## 2023-01-15 NOTE — Patient Instructions (Signed)
Medication Instructions:  Your physician recommends that you continue on your current medications as directed. Please refer to the Current Medication list given to you today.  *If you need a refill on your cardiac medications before your next appointment, please call your pharmacy*  Follow-Up: At [facility], you and your health needs are our priority.  As part of our continuing mission to provide you with exceptional heart care, we have created designated Provider Care Teams.  These Care Teams include your primary Cardiologist (physician) and Advanced Practice Providers (APPs -  Physician Assistants and Nurse Practitioners) who all work together to provide you with the care you need, when you need it.  We recommend signing up for the patient portal called "MyChart".  Sign up information is provided on this After Visit Summary.  MyChart is used to connect with patients for Virtual Visits (Telemedicine).  Patients are able to view lab/test results, encounter notes, upcoming appointments, etc.  Non-urgent messages can be sent to your provider as well.   To learn more about what you can do with MyChart, go to [email or web address].    Your next appointment:   As scheduled  Provider:   Merry Proud Ollis/Gregg Ladona Ridgel, NP

## 2023-01-15 NOTE — Progress Notes (Signed)
   Nurse Visit   Date of Encounter: 01/15/2023 ID: Anthony [name], DOB 13-May-1970, MRN [id number]  PCP:  Erick Alley, DO    HeartCare Providers Cardiologist:  [name], MD Electrophysiologist:  Regan Lemming, MD      Visit Details   VS:  BP 103/61 (BP Location: Right Arm, Patient Position: Sitting)   Pulse 93   Ht 5\' 8"  (1.727 m)   Wt 165 lb (74.8 kg)   SpO2 96%   BMI 25.09 kg/m  , BMI Body mass index is 25.09 kg/m.  Wt Readings from Last 3 Encounters:  01/15/23 165 lb (74.8 kg)  12/31/22 165 lb (74.8 kg)  09/06/22 157 lb (71.2 kg)     Reason for visit: EKG for flecainide monitoring Performed today: Vitals, EKG, Provider consulted:Taylor, and Education Changes (medications, testing, etc.) : NONE Length of Visit: 15 minutes    Medications Adjustments/Labs and Tests Ordered: Orders Placed This Encounter  Procedures   EKG 12-Lead   No orders of the defined types were placed in this encounter.  Consulted with Canary Brim, NP and Ladona Ridgel, MD for review of EKG after flecainide dose increase. Pt doing well. No changes to be made today.  Signed, Lars Mage, RN  01/15/2023 3:17 PM

## 2023-02-03 ENCOUNTER — Ambulatory Visit: Payer: Medicare HMO | Admitting: Orthopedic Surgery

## 2023-02-12 DIAGNOSIS — L89154 Pressure ulcer of sacral region, stage 4: Secondary | ICD-10-CM | POA: Diagnosis not present

## 2023-02-12 DIAGNOSIS — Z933 Colostomy status: Secondary | ICD-10-CM | POA: Diagnosis not present

## 2023-02-12 DIAGNOSIS — L89214 Pressure ulcer of right hip, stage 4: Secondary | ICD-10-CM | POA: Diagnosis not present

## 2023-02-12 DIAGNOSIS — L89224 Pressure ulcer of left hip, stage 4: Secondary | ICD-10-CM | POA: Diagnosis not present

## 2023-02-12 DIAGNOSIS — L89612 Pressure ulcer of right heel, stage 2: Secondary | ICD-10-CM | POA: Diagnosis not present

## 2023-02-13 DIAGNOSIS — L89154 Pressure ulcer of sacral region, stage 4: Secondary | ICD-10-CM | POA: Diagnosis not present

## 2023-02-13 DIAGNOSIS — L89214 Pressure ulcer of right hip, stage 4: Secondary | ICD-10-CM | POA: Diagnosis not present

## 2023-02-13 DIAGNOSIS — Z933 Colostomy status: Secondary | ICD-10-CM | POA: Diagnosis not present

## 2023-02-13 DIAGNOSIS — L89612 Pressure ulcer of right heel, stage 2: Secondary | ICD-10-CM | POA: Diagnosis not present

## 2023-02-13 DIAGNOSIS — L89224 Pressure ulcer of left hip, stage 4: Secondary | ICD-10-CM | POA: Diagnosis not present

## 2023-02-24 ENCOUNTER — Other Ambulatory Visit: Payer: Self-pay

## 2023-02-24 ENCOUNTER — Emergency Department (HOSPITAL_COMMUNITY): Payer: Medicare HMO

## 2023-02-24 ENCOUNTER — Emergency Department (HOSPITAL_COMMUNITY)
Admission: EM | Admit: 2023-02-24 | Discharge: 2023-02-25 | Discharge status: Against Medical Advice | Payer: Medicare HMO | Attending: Emergency Medicine | Admitting: Emergency Medicine

## 2023-02-24 ENCOUNTER — Encounter (HOSPITAL_COMMUNITY): Payer: Self-pay

## 2023-02-24 DIAGNOSIS — S31000A Unspecified open wound of lower back and pelvis without penetration into retroperitoneum, initial encounter: Secondary | ICD-10-CM | POA: Insufficient documentation

## 2023-02-24 DIAGNOSIS — Z5321 Procedure and treatment not carried out due to patient leaving prior to being seen by health care provider: Secondary | ICD-10-CM | POA: Diagnosis not present

## 2023-02-24 DIAGNOSIS — X58XXXA Exposure to other specified factors, initial encounter: Secondary | ICD-10-CM | POA: Insufficient documentation

## 2023-02-24 DIAGNOSIS — L89309 Pressure ulcer of unspecified buttock, unspecified stage: Secondary | ICD-10-CM | POA: Diagnosis not present

## 2023-02-24 DIAGNOSIS — R509 Fever, unspecified: Secondary | ICD-10-CM | POA: Diagnosis not present

## 2023-02-24 DIAGNOSIS — R590 Localized enlarged lymph nodes: Secondary | ICD-10-CM | POA: Diagnosis not present

## 2023-02-24 DIAGNOSIS — K4090 Unilateral inguinal hernia, without obstruction or gangrene, not specified as recurrent: Secondary | ICD-10-CM | POA: Diagnosis not present

## 2023-02-24 LAB — CBC WITH DIFFERENTIAL/PLATELET
Abs Immature Granulocytes: 0.1 10*3/uL — ABNORMAL HIGH (ref 0.00–0.07)
Basophils Absolute: 0.1 10*3/uL (ref 0.0–0.1)
Basophils Relative: 1 %
Eosinophils Absolute: 0.2 10*3/uL (ref 0.0–0.5)
Eosinophils Relative: 2 %
HCT: 36.3 % — ABNORMAL LOW (ref 39.0–52.0)
Hemoglobin: 10.5 g/dL — ABNORMAL LOW (ref 13.0–17.0)
Immature Granulocytes: 1 %
Lymphocytes Relative: 13 %
Lymphs Abs: 1.6 10*3/uL (ref 0.7–4.0)
MCH: 23.4 pg — ABNORMAL LOW (ref 26.0–34.0)
MCHC: 28.9 g/dL — ABNORMAL LOW (ref 30.0–36.0)
MCV: 80.8 fL (ref 80.0–100.0)
Monocytes Absolute: 1.6 10*3/uL — ABNORMAL HIGH (ref 0.1–1.0)
Monocytes Relative: 14 %
Neutro Abs: 8.3 10*3/uL — ABNORMAL HIGH (ref 1.7–7.7)
Neutrophils Relative %: 69 %
Platelets: 775 10*3/uL — ABNORMAL HIGH (ref 150–400)
RBC: 4.49 MIL/uL (ref 4.22–5.81)
RDW: 16.2 % — ABNORMAL HIGH (ref 11.5–15.5)
WBC: 11.9 10*3/uL — ABNORMAL HIGH (ref 4.0–10.5)
nRBC: 0 % (ref 0.0–0.2)

## 2023-02-24 LAB — COMPREHENSIVE METABOLIC PANEL
ALT: 13 U/L (ref 0–44)
AST: 17 U/L (ref 15–41)
Albumin: 2.4 g/dL — ABNORMAL LOW (ref 3.5–5.0)
Alkaline Phosphatase: 126 U/L (ref 38–126)
Anion gap: 12 (ref 5–15)
BUN: 7 mg/dL (ref 6–20)
CO2: 24 mmol/L (ref 22–32)
Calcium: 8.9 mg/dL (ref 8.9–10.3)
Chloride: 104 mmol/L (ref 98–111)
Creatinine, Ser: 0.72 mg/dL (ref 0.61–1.24)
GFR, Estimated: >60 mL/min (ref >60)
Glucose, Bld: 112 mg/dL — ABNORMAL HIGH (ref 70–99)
Potassium: 3 mmol/L — ABNORMAL LOW (ref 3.5–5.1)
Sodium: 140 mmol/L (ref 135–145)
Total Bilirubin: 0.2 mg/dL (ref 0.0–1.2)
Total Protein: 7.5 g/dL (ref 6.5–8.1)

## 2023-02-24 LAB — MAGNESIUM: Magnesium: 2 mg/dL (ref 1.7–2.4)

## 2023-02-24 MED ORDER — IOHEXOL 350 MG/ML SOLN
75.0000 mL | Freq: Once | INTRAVENOUS | Status: AC | PRN
  Administered 2023-02-24: 75 mL via INTRAVENOUS

## 2023-02-24 NOTE — ED Triage Notes (Signed)
Pt here for wounds on lower back and buttox for about 4 months. Pt states HBG and K is low at nursing home. C/O fevers and chills.

## 2023-02-24 NOTE — ED Provider Triage Note (Signed)
 Emergency Medicine Provider Triage Evaluation Note  Anthony [name] , a 53 y.o. male  was evaluated in triage.  Pt complains of pressure wounds on his buttocks and extending down to his legs.  States he has felt feverish and has chills at home.  Also reports he wanted to check his potassium and hemoglobin since these are low at the nursing home he was out.  Review of Systems  Positive: As above Negative: As above  Physical Exam  BP 118/79   Pulse (!) 121   Temp 99.2 F (37.3 C)   Resp 17   Ht 5' 4 (1.626 m)   Wt 72.6 kg   SpO2 99%   BMI 27.46 kg/m  Gen:   Awake, [name] distress   Resp:  Normal effort  MSK:   Moves extremities without difficulty     Significant pressure wounds of the buttocks bilaterally         Medical Decision Making  Medically screening exam initiated at 7:07 PM.  Appropriate orders placed.  Nancyann DELENA Daring was informed that the remainder of the evaluation will be completed by another provider, this initial triage assessment does not replace that evaluation, and the importance of remaining in the ED until their evaluation is complete.     Veta Palma, PA-C 02/24/23 TYRA

## 2023-02-25 NOTE — ED Notes (Signed)
 Patient stated he needed to go home, he asked to remove IV and left ED. Pulled OTF.

## 2023-03-01 ENCOUNTER — Encounter (HOSPITAL_COMMUNITY): Payer: Self-pay

## 2023-03-01 ENCOUNTER — Emergency Department (HOSPITAL_COMMUNITY)
Admission: EM | Admit: 2023-03-01 | Discharge: 2023-03-01 | Disposition: A | Discharge status: Home / Self Care | Payer: Medicare HMO | Attending: Emergency Medicine | Admitting: Emergency Medicine

## 2023-03-01 ENCOUNTER — Emergency Department (HOSPITAL_COMMUNITY): Payer: Medicare HMO

## 2023-03-01 ENCOUNTER — Other Ambulatory Visit: Payer: Self-pay

## 2023-03-01 DIAGNOSIS — Z5189 Encounter for other specified aftercare: Secondary | ICD-10-CM

## 2023-03-01 DIAGNOSIS — R509 Fever, unspecified: Secondary | ICD-10-CM | POA: Diagnosis not present

## 2023-03-01 DIAGNOSIS — Z4800 Encounter for change or removal of nonsurgical wound dressing: Secondary | ICD-10-CM | POA: Diagnosis not present

## 2023-03-01 DIAGNOSIS — Z7901 Long term (current) use of anticoagulants: Secondary | ICD-10-CM | POA: Diagnosis not present

## 2023-03-01 DIAGNOSIS — Z4801 Encounter for change or removal of surgical wound dressing: Secondary | ICD-10-CM | POA: Diagnosis not present

## 2023-03-01 LAB — URINALYSIS, W/ REFLEX TO CULTURE (INFECTION SUSPECTED)
Bilirubin Urine: NEGATIVE
Glucose, UA: NEGATIVE mg/dL
Hgb urine dipstick: NEGATIVE
Ketones, ur: NEGATIVE mg/dL
Nitrite: NEGATIVE
Protein, ur: >=300 mg/dL — AB
Specific Gravity, Urine: 1.026 (ref 1.005–1.030)
pH: 8 (ref 5.0–8.0)

## 2023-03-01 LAB — CBC WITH DIFFERENTIAL/PLATELET
Abs Immature Granulocytes: 0.03 10*3/uL (ref 0.00–0.07)
Basophils Absolute: 0.1 10*3/uL (ref 0.0–0.1)
Basophils Relative: 2 %
Eosinophils Absolute: 0.4 10*3/uL (ref 0.0–0.5)
Eosinophils Relative: 6 %
HCT: 34.1 % — ABNORMAL LOW (ref 39.0–52.0)
Hemoglobin: 9.8 g/dL — ABNORMAL LOW (ref 13.0–17.0)
Immature Granulocytes: 0 %
Lymphocytes Relative: 21 %
Lymphs Abs: 1.6 10*3/uL (ref 0.7–4.0)
MCH: 23.2 pg — ABNORMAL LOW (ref 26.0–34.0)
MCHC: 28.7 g/dL — ABNORMAL LOW (ref 30.0–36.0)
MCV: 80.8 fL (ref 80.0–100.0)
Monocytes Absolute: 1 10*3/uL (ref 0.1–1.0)
Monocytes Relative: 13 %
Neutro Abs: 4.7 10*3/uL (ref 1.7–7.7)
Neutrophils Relative %: 58 %
Platelets: 613 10*3/uL — ABNORMAL HIGH (ref 150–400)
RBC: 4.22 MIL/uL (ref 4.22–5.81)
RDW: 15.9 % — ABNORMAL HIGH (ref 11.5–15.5)
WBC: 7.9 10*3/uL (ref 4.0–10.5)
nRBC: 0 % (ref 0.0–0.2)

## 2023-03-01 LAB — BASIC METABOLIC PANEL
Anion gap: 10 (ref 5–15)
BUN: 11 mg/dL (ref 6–20)
CO2: 28 mmol/L (ref 22–32)
Calcium: 8.9 mg/dL (ref 8.9–10.3)
Chloride: 103 mmol/L (ref 98–111)
Creatinine, Ser: 0.69 mg/dL (ref 0.61–1.24)
GFR, Estimated: >60 mL/min (ref >60)
Glucose, Bld: 127 mg/dL — ABNORMAL HIGH (ref 70–99)
Potassium: 3.1 mmol/L — ABNORMAL LOW (ref 3.5–5.1)
Sodium: 141 mmol/L (ref 135–145)

## 2023-03-01 LAB — CULTURE, BLOOD (ROUTINE X 2): Culture: NO GROWTH

## 2023-03-01 MED ORDER — BISMUTH SUBSALICYLATE 262 MG/15ML PO SUSP
30.0000 mL | Freq: Once | ORAL | Status: DC
  Filled 2023-03-01: qty 236

## 2023-03-01 NOTE — Discharge Instructions (Signed)
 As we discussed, your labs are reassuring and there is no definite infection identified. Your urine sample has been sent for culture and this can be reviewed with your doctor in follow up this week.   Return to the ED with any new or worsening symptoms at any time.

## 2023-03-01 NOTE — ED Provider Notes (Signed)
 [facility] AT [facility] Provider Note   CSN: [id number] Arrival date & time: 03/01/23  1047     History  Chief Complaint  Patient presents with   Wound Check    Anthony [name] is a 53 y.o. male.  Patient with history of paraplegia, chronic decubitus ulcers, suprapubic catheter, presents for concern for infection related to decubiti. He reports for the past 3 weeks, he has had nighttime fevers which is unusual for him. No vomiting. He denies drainage to the ulcerated areas. No bleeding or malodor.   The history is provided by the patient. No language interpreter was used.  Wound Check       Home Medications Prior to Admission medications   Medication Sig Start Date End Date Taking? Authorizing Provider  acetaminophen  (TYLENOL ) 500 MG tablet Take 1,000 mg by mouth every 6 (six) hours as needed for moderate pain, headache or fever.    [provider]  apixaban  (ELIQUIS ) 5 MG TABS tablet Take 1 tablet (5 mg total) by mouth 2 (two) times daily. Patient not taking: Reported on 12/31/2022 09/01/22   Nicholas Bar, MD  ascorbic acid  (VITAMIN C ) 500 MG tablet Take 1 tablet (500 mg total) by mouth 2 (two) times daily. 09/01/22   Nicholas Bar, MD  Catheters (FOLEY CATHETER 2-WAY) MISC Use as directed by urologist. 12/30/12   Curtis Hadassah DASEN, MD  feeding supplement (ENSURE ENLIVE / ENSURE PLUS) LIQD Take 237 mLs by mouth 3 (three) times daily between meals. 09/01/22   Nicholas Bar, MD  flecainide  (TAMBOCOR ) 100 MG tablet Take 1 tablet (100 mg total) by mouth 2 (two) times daily. 12/31/22   Aniceto Daphne CROME, NP  gabapentin  (NEURONTIN ) 100 MG capsule Take 1 capsule (100 mg total) by mouth 3 (three) times daily. Patient not taking: Reported on 01/15/2023 09/01/22   Nicholas Bar, MD  hydrocerin (EUCERIN) CREA Apply 1 Application topically 2 (two) times daily. Patient not taking: Reported on 09/07/2022 09/01/22   Nicholas Bar, MD  ibuprofen   (ADVIL ) 200 MG tablet Take 200 mg by mouth every 6 (six) hours as needed for headache or moderate pain.    [provider]  lactulose  (CHRONULAC ) 10 GM/15ML solution Take 15 mLs (10 g total) by mouth 2 (two) times daily. Patient not taking: Reported on 01/15/2023 09/01/22   Nicholas Bar, MD  lisinopril -hydrochlorothiazide  (ZESTORETIC ) 20-25 MG tablet Take 1 tablet by mouth daily. 02/17/22   Lucien Orren SAILOR, PA-C  loratadine  (CLARITIN ) 10 MG tablet Take 1 tablet (10 mg total) by mouth daily. Patient not taking: Reported on 01/15/2023 09/02/22   Nicholas Bar, MD  methocarbamol  1000 MG TABS Take 1,000 mg by mouth every 8 (eight) hours. Patient not taking: Reported on 01/15/2023 09/01/22   Nicholas Bar, MD  metoprolol  succinate (TOPROL -XL) 50 MG 24 hr tablet Take 1 tablet (50 mg total) by mouth 2 (two) times daily. Take with or immediately following a meal. 01/10/22   Conte, Tessa N, PA-C  Multiple Vitamin (MULTIVITAMIN WITH MINERALS) TABS tablet Take 1 tablet by mouth daily.    [provider]  nitroGLYCERIN  (NITROSTAT ) 0.4 MG SL tablet Place 1 tablet (0.4 mg total) under the tongue every 5 (five) minutes as needed for chest pain. 01/10/22   Lucien Orren SAILOR, PA-C  Omega-3 Fatty Acids (FISH OIL PO) Take 1 capsule by mouth daily. Patient not taking: Reported on 01/15/2023    [provider]  psyllium (HYDROCIL/METAMUCIL) 95 % PACK Take 1 packet by mouth  daily. 09/02/22   Nicholas Bar, MD  rosuvastatin  (CRESTOR ) 40 MG tablet Take 1 tablet by mouth once daily 05/01/22   Turner, Wilbert SAUNDERS, MD  senna (SENOKOT) 8.6 MG TABS tablet Take 1 tablet (8.6 mg total) by mouth daily. Patient not taking: Reported on 01/15/2023 09/02/22   Nicholas Bar, MD  simethicone  (MYLICON) 80 MG chewable tablet Chew 1 tablet (80 mg total) by mouth every 6 (six) hours as needed for flatulence. Patient not taking: Reported on 01/15/2023 09/01/22   Nicholas Bar, MD  Sodium Hypochlorite (DAKINS, FULL  STRENGTH,) 0.5 % SOLN Apply 1 Application topically See admin instructions. Apply to right hip topically every day and evening shift for wound.    [provider]  zinc  sulfate 220 (50 Zn) MG capsule Take 1 capsule (220 mg total) by mouth daily. 09/02/22   Nicholas Bar, MD      Allergies    Firvanq  [vancomycin ]    Review of Systems   Review of Systems  Physical Exam Updated Vital Signs BP 124/73   Pulse 85   Temp 98.4 F (36.9 C)   Resp 18   Ht 5' 4 (1.626 m)   Wt 72.6 kg   SpO2 97%   BMI 27.46 kg/m  Physical Exam Vitals and nursing note reviewed.  Constitutional:      Appearance: He is well-developed.  HENT:     Head: Normocephalic.  Cardiovascular:     Rate and Rhythm: Normal rate and regular rhythm.  Pulmonary:     Effort: Pulmonary effort is normal.     Breath sounds: Normal breath sounds. No wheezing, rhonchi or rales.  Abdominal:     General: Bowel sounds are normal.     Palpations: Abdomen is soft.     Tenderness: There is no abdominal tenderness. There is no guarding or rebound.  Musculoskeletal:        General: Normal range of motion.     Cervical back: Normal range of motion and neck supple.  Skin:    General: Skin is warm and dry.     Comments: Multiple full thickness ulcerations to sacrum and bilateral buttocks. There are chronic deep wounds to hips. Packing removed for exam. No purulence or malodor. No bleeding. There is exposed subcutaneous tissue that is pink and without inflammation.   Neurological:     General: No focal deficit present.     Mental Status: He is alert and oriented to person, place, and time.     ED Results / Procedures / Treatments   Labs (all labs ordered are listed, but only abnormal results are displayed) Labs Reviewed  CBC WITH DIFFERENTIAL/PLATELET - Abnormal; Notable for the following components:      Result Value   Hemoglobin 9.8 (*)    HCT 34.1 (*)    MCH 23.2 (*)    MCHC 28.7 (*)    RDW 15.9 (*)     Platelets 613 (*)    All other components within normal limits  BASIC METABOLIC PANEL - Abnormal; Notable for the following components:   Potassium 3.1 (*)    Glucose, Bld 127 (*)    All other components within normal limits  URINALYSIS, W/ REFLEX TO CULTURE (INFECTION SUSPECTED) - Abnormal; Notable for the following components:   APPearance TURBID (*)    Protein, ur >=300 (*)    Leukocytes,Ua MODERATE (*)    Bacteria, UA MANY (*)    All other components within normal limits    EKG None  Radiology  DG Chest Portable 1 View Result Date: 03/01/2023 CLINICAL DATA:  Fevers at night. EXAM: PORTABLE CHEST 1 VIEW COMPARISON:  09/06/2022 FINDINGS: Lordotic AP positioning. Low volume film. The lungs are clear without focal pneumonia, edema, pneumothorax or pleural effusion. The cardiopericardial silhouette is within normal limits for size. No acute bony abnormality. IMPRESSION: Low volume film without acute cardiopulmonary findings. Electronically Signed   By: Camellia Candle M.D.   On: 03/01/2023 13:23    Procedures Procedures    Medications Ordered in ED Medications - No data to display  ED Course/ Medical Decision Making/ A&P Clinical Course as of 03/01/23 1401  Sun Mar 01, 2023  1304 Packing removed from deep pocket at right hip and replaced with clean saline gauze. Sacral ulcers also repacked with clean gauze.  [SU]  1349 Patient to ED concerned for infection of decubitus ulcerations causing nighttime fevers. Labs unremarkable with exception of mild hypokalemia. Will supplement with oral potassium. CXR clear, urine ?infection, however, is supracatheterized. No fever in ED, no leukocytosis. Will send for culture. Ulcerations appear well cared for without secondary signs of infection. The patient has been seen by Dr. Bernard. He is felt appropriate for discharge home. Recommend follow up with wound care for ongoing treatment of deep sacral and hip ulcers.  [SU]  1357 Patient's PCP is [facility]. Encouraged to schedule a recheck appointment in close follow up of ED visit and review of urine cultures.  [SU]    Clinical Course User Index [SU] Odell Balls, PA-C                                 Medical Decision Making Amount and/or Complexity of Data Reviewed Labs: ordered. Radiology: ordered.           Final Clinical Impression(s) / ED Diagnoses Final diagnoses:  Visit for wound check    Rx / DC Orders ED Discharge Orders     None         Odell Balls, PA-C 03/01/23 1401    Bernard Drivers, MD 03/02/23 [phone]

## 2023-03-01 NOTE — ED Triage Notes (Signed)
 Patient is a paraplegic and has wounds to bilateral hips and back.  Reports he is wanting to get them checked bc they are deep and draining.  Reports fevers at night but none currently.  Denies foul odor

## 2023-03-02 ENCOUNTER — Telehealth: Payer: Self-pay

## 2023-03-02 NOTE — Transitions of Care (Post Inpatient/ED Visit) (Signed)
 03/02/2023  Name: Anthony [name] MRN: [id number] DOB: 1970-07-07  Today's TOC FU Call Status: Today's TOC FU Call Status:: Successful TOC FU Call Completed TOC FU Call Complete Date: 03/02/23 Patient's Name and Date of Birth confirmed.  Transition Care Management Follow-up Telephone Call Discharge Facility: [facility]) Type of Discharge: Emergency Department Reason for ED Visit: Other: (wound check) How have you been since you were released from the hospital?: Same Any questions or concerns?: No  Items Reviewed: Did you receive and understand the discharge instructions provided?: Yes Medications obtained,verified, and reconciled?: Yes (Medications Reviewed) Any new allergies since your discharge?: No Dietary orders reviewed?: Yes Do you have support at home?: Yes People in Home: friend(s)  Medications Reviewed Today: Medications Reviewed Today     Reviewed by Darrall Ellison, LPN (Licensed Practical Nurse) on 03/02/23 at 1449  Med List Status: <None>   Medication Order Taking? Sig Documenting Provider Last Dose Status Informant  acetaminophen  (TYLENOL ) 500 MG tablet [id number] No Take 1,000 mg by mouth every 6 (six) hours as needed for moderate pain, headache or fever. [provider] Taking Active Nursing Home Medication Administration Guide [facility]), Pharmacy Records           Med Note Collene Dawson, JACQUELINE L   Wed Dec 31, 2022  3:30 PM)    apixaban  (ELIQUIS ) 5 MG TABS tablet [id number] No Take 1 tablet (5 mg total) by mouth 2 (two) times daily.  Patient not taking: Reported on 12/31/2022   Santa Cuba, MD Not Taking Active Nursing Home Medication Administration Guide [facility]), Pharmacy Records           Med Note Merry Abo Dec 31, 2022  4:43 PM) Pt reports he completed this in Oct 2024 - was for RUE DVT  ascorbic acid  (VITAMIN C ) 500 MG tablet [id number] No Take 1 tablet (500 mg total) by mouth 2 (two) times daily. Santa Cuba, MD Taking Active  Nursing Home Medication Administration Guide [facility]), Pharmacy Records  Catheters (FOLEY CATHETER 2-WAY) MISC [id number] No Use as directed by urologist. Keith Pat, MD Taking Active Pharmacy Records, Nursing Home Medication Administration Guide (MAG)  feeding supplement (ENSURE ENLIVE / ENSURE PLUS) LIQD [id number] No Take 237 mLs by mouth 3 (three) times daily between meals. Santa Cuba, MD Taking Active Nursing Home Medication Administration Guide (MAG), Pharmacy Records  flecainide  (TAMBOCOR ) 100 MG tablet [id number] No Take 1 tablet (100 mg total) by mouth 2 (two) times daily. Thomasena Fleming, NP Taking Active   gabapentin  (NEURONTIN ) 100 MG capsule [id number] No Take 1 capsule (100 mg total) by mouth 3 (three) times daily.  Patient not taking: Reported on 01/15/2023   Santa Cuba, MD Not Taking Active Nursing Home Medication Administration Guide [facility]), Pharmacy Records  hydrocerin [facility]) CREA [id number] No Apply 1 Application topically 2 (two) times daily.  Patient not taking: Reported on 09/07/2022   Santa Cuba, MD Not Taking Active Nursing Home Medication Administration Guide (MAG), Pharmacy Records  ibuprofen  (ADVIL ) 200 MG tablet [id number] No Take 200 mg by mouth every 6 (six) hours as needed for headache or moderate pain. [provider] Taking Active Nursing Home Medication Administration Guide (MAG), Pharmacy Records  lactulose  (CHRONULAC ) 10 GM/15ML solution [id number] No Take 15 mLs (10 g total) by mouth 2 (two) times daily.  Patient not taking: Reported on 01/15/2023   Santa Cuba, MD Not Taking Active Nursing Home Medication Administration Guide (MAG), Pharmacy Records  lisinopril -hydrochlorothiazide  (ZESTORETIC ) 20-25 MG tablet [id number]  No Take 1 tablet by mouth daily. Von Grumbling, PA-C Taking Active Pharmacy Records, Nursing Home Medication Administration Guide (MAG)  loratadine  (CLARITIN ) 10 MG tablet [id number] No Take 1 tablet (10 mg total) by mouth  daily.  Patient not taking: Reported on 01/15/2023   Santa Cuba, MD Not Taking Active Nursing Home Medication Administration Guide (MAG), Pharmacy Records  methocarbamol  1000 MG TABS [id number] No Take 1,000 mg by mouth every 8 (eight) hours.  Patient not taking: Reported on 01/15/2023   Santa Cuba, MD Not Taking Active Nursing Home Medication Administration Guide (MAG), Pharmacy Records  metoprolol  succinate (TOPROL -XL) 50 MG 24 hr tablet [id number] No Take 1 tablet (50 mg total) by mouth 2 (two) times daily. Take with or immediately following a meal. Von Grumbling, PA-C Taking Active Pharmacy Records, Nursing Home Medication Administration Guide (MAG)  Multiple Vitamin (MULTIVITAMIN WITH MINERALS) TABS tablet [id number] No Take 1 tablet by mouth daily. [provider] Taking Active Nursing Home Medication Administration Guide (MAG), Pharmacy Records  nitroGLYCERIN  (NITROSTAT ) 0.4 MG SL tablet [id number] No Place 1 tablet (0.4 mg total) under the tongue every 5 (five) minutes as needed for chest pain. Conte, Tessa N, PA-C Taking Active Pharmacy Records, Nursing Home Medication Administration Guide (MAG)           Med Note (BRIDGES, JACQUELINE L   Wed Dec 31, 2022  3:31 PM)    Omega-3 Fatty Acids (FISH OIL PO) [id number] No Take 1 capsule by mouth daily.  Patient not taking: Reported on 01/15/2023   [provider] Not Taking Active Nursing Home Medication Administration Guide (MAG), Pharmacy Records  psyllium (HYDROCIL/METAMUCIL) 95 % PACK [id number] No Take 1 packet by mouth daily. Santa Cuba, MD Taking Active Nursing Home Medication Administration Guide (MAG), Pharmacy Records  rosuvastatin  (CRESTOR ) 40 MG tablet [id number] No Take 1 tablet by mouth once daily Jacqueline Matsu, MD Taking Active Pharmacy Records, Nursing Home Medication Administration Guide (MAG)  senna (SENOKOT) 8.6 MG TABS tablet [id number] No Take 1 tablet (8.6 mg total) by mouth daily.  Patient not  taking: Reported on 01/15/2023   Santa Cuba, MD Not Taking Active Nursing Home Medication Administration Guide (MAG), Pharmacy Records  simethicone  [facility]) 80 MG chewable tablet [id number] No Chew 1 tablet (80 mg total) by mouth every 6 (six) hours as needed for flatulence.  Patient not taking: Reported on 01/15/2023   Santa Cuba, MD Not Taking Active Nursing Home Medication Administration Guide [facility]), Pharmacy Records           Med Note (BRIDGES, JACQUELINE L   Wed Dec 31, 2022  3:34 PM)    Sodium Hypochlorite (DAKINS, FULL STRENGTH,) 0.5 % SOLN [id number] No Apply 1 Application topically See admin instructions. Apply to right hip topically every day and evening shift for wound. [provider] Taking Active Nursing Home Medication Administration Guide (MAG), Pharmacy Records  zinc  sulfate 220 (50 Zn) MG capsule [id number] No Take 1 capsule (220 mg total) by mouth daily. Santa Cuba, MD Taking Active Nursing Home Medication Administration Guide [facility]), Pharmacy Records  Med List Note (Soremekun, Olalekan O, CPhT 09/07/22 2355): Resident of [facility] Address: [address] [place], [place] [address]  Phone: [phone]            Home Care and Equipment/Supplies: Were Home Health Services Ordered?: NA Any new equipment or medical supplies ordered?: NA  Functional Questionnaire: Do you need assistance with bathing/showering or dressing?: No Do you need assistance with  meal preparation?: No Do you need assistance with eating?: No Do you have difficulty maintaining continence: No Do you need assistance with getting out of bed/getting out of a chair/moving?: No Do you have difficulty managing or taking your medications?: No  Follow up appointments reviewed: PCP Follow-up appointment confirmed?: No (declined appt) MD Provider Line Number:[phone] Given: No Specialist Hospital Follow-up appointment confirmed?: NA Do you need transportation  to your follow-up appointment?: No Do you understand care options if your condition(s) worsen?: Yes-patient verbalized understanding    SIGNATURE Darrall Ellison, LPN [facility] Nurse Health Advisor Direct Dial [phone]

## 2023-03-05 ENCOUNTER — Other Ambulatory Visit: Payer: Self-pay | Admitting: Physician Assistant

## 2023-03-09 ENCOUNTER — Telehealth: Payer: Self-pay | Admitting: Physician Assistant

## 2023-03-09 DIAGNOSIS — L89224 Pressure ulcer of left hip, stage 4: Secondary | ICD-10-CM | POA: Diagnosis not present

## 2023-03-09 DIAGNOSIS — L89214 Pressure ulcer of right hip, stage 4: Secondary | ICD-10-CM | POA: Diagnosis not present

## 2023-03-09 DIAGNOSIS — Z933 Colostomy status: Secondary | ICD-10-CM | POA: Diagnosis not present

## 2023-03-09 DIAGNOSIS — L89154 Pressure ulcer of sacral region, stage 4: Secondary | ICD-10-CM | POA: Diagnosis not present

## 2023-03-09 DIAGNOSIS — L89612 Pressure ulcer of right heel, stage 2: Secondary | ICD-10-CM | POA: Diagnosis not present

## 2023-03-09 NOTE — Telephone Encounter (Signed)
*  STAT* If patient is at the pharmacy, call can be transferred to refill team.   1. Which medications need to be refilled? (please list name of each medication and dose if known)  Lisinopril  2. Which pharmacy/location (including street and city if local pharmacy) is medication to be sent to? [facility] - [place] ([place]), [place] - [address]  3. Do they need a 30 day or 90 day supply?   90 day supply  Patient states the pharmacy is telling him it's too soon to distribute the medication.

## 2023-03-10 NOTE — Telephone Encounter (Signed)
RX sent in on 03/05/23

## 2023-03-11 DIAGNOSIS — Z933 Colostomy status: Secondary | ICD-10-CM | POA: Diagnosis not present

## 2023-03-11 DIAGNOSIS — L89154 Pressure ulcer of sacral region, stage 4: Secondary | ICD-10-CM | POA: Diagnosis not present

## 2023-03-11 DIAGNOSIS — L89612 Pressure ulcer of right heel, stage 2: Secondary | ICD-10-CM | POA: Diagnosis not present

## 2023-03-11 DIAGNOSIS — L89214 Pressure ulcer of right hip, stage 4: Secondary | ICD-10-CM | POA: Diagnosis not present

## 2023-03-24 ENCOUNTER — Telehealth: Payer: Self-pay | Admitting: Physician Assistant

## 2023-03-24 MED ORDER — LISINOPRIL-HYDROCHLOROTHIAZIDE 20-25 MG PO TABS: 1.0000 | ORAL_TABLET | Freq: Every day | ORAL | 0 refills | Status: DC

## 2023-03-24 NOTE — Telephone Encounter (Signed)
 Pt c/o medication issue:  1. Name of Medication:   lisinopril-hydrochlorothiazide (ZESTORETIC) 20-25 MG tablet    2. How are you currently taking this medication (dosage and times per day)? As written  3. Are you having a reaction (difficulty breathing--STAT)? No   4. What is your medication issue? Pt keeps running out of medication and the pharmacy tells him it is to early to refill. Please advise

## 2023-03-25 ENCOUNTER — Ambulatory Visit (HOSPITAL_BASED_OUTPATIENT_CLINIC_OR_DEPARTMENT_OTHER): Payer: Medicare HMO | Admitting: General Surgery

## 2023-04-04 DIAGNOSIS — L89224 Pressure ulcer of left hip, stage 4: Secondary | ICD-10-CM | POA: Diagnosis not present

## 2023-04-04 DIAGNOSIS — L89154 Pressure ulcer of sacral region, stage 4: Secondary | ICD-10-CM | POA: Diagnosis not present

## 2023-04-04 DIAGNOSIS — L89214 Pressure ulcer of right hip, stage 4: Secondary | ICD-10-CM | POA: Diagnosis not present

## 2023-04-04 DIAGNOSIS — Z933 Colostomy status: Secondary | ICD-10-CM | POA: Diagnosis not present

## 2023-04-04 DIAGNOSIS — L89612 Pressure ulcer of right heel, stage 2: Secondary | ICD-10-CM | POA: Diagnosis not present

## 2023-04-05 ENCOUNTER — Other Ambulatory Visit: Payer: Self-pay | Admitting: Physician Assistant

## 2023-04-07 MED ORDER — LISINOPRIL-HYDROCHLOROTHIAZIDE 20-25 MG PO TABS: 1.0000 | ORAL_TABLET | Freq: Every day | ORAL | 0 refills | Status: AC

## 2023-04-07 NOTE — Addendum Note (Signed)
 Addended by: Adriana Simas, Evone Arseneau L on: 04/07/2023 02:35 PM   Modules accepted: Orders

## 2023-04-08 DIAGNOSIS — L89154 Pressure ulcer of sacral region, stage 4: Secondary | ICD-10-CM | POA: Diagnosis not present

## 2023-04-08 DIAGNOSIS — L89214 Pressure ulcer of right hip, stage 4: Secondary | ICD-10-CM | POA: Diagnosis not present

## 2023-04-08 DIAGNOSIS — L89224 Pressure ulcer of left hip, stage 4: Secondary | ICD-10-CM | POA: Diagnosis not present

## 2023-04-08 DIAGNOSIS — Z933 Colostomy status: Secondary | ICD-10-CM | POA: Diagnosis not present

## 2023-04-08 DIAGNOSIS — L89612 Pressure ulcer of right heel, stage 2: Secondary | ICD-10-CM | POA: Diagnosis not present

## 2023-05-01 ENCOUNTER — Ambulatory Visit: Payer: Medicare HMO | Attending: Cardiology | Admitting: Pulmonary Disease

## 2023-05-01 NOTE — Progress Notes (Deleted)
  Electrophysiology Office Note:   Date:  05/01/2023  ID:  Anthony [name], DOB June 21, 1970, MRN [id number]  Primary Cardiologist: [name], MD Primary Heart Failure: None Electrophysiologist: Will Jorja Loa, MD  {Click to update primary MD,subspecialty MD or APP then REFRESH:1}    History of Present Illness:   Anthony [name] is a 53 y.o. male with h/o PVC's, HTN, HLD, wheelchair bound in setting of spina bifida, chronic indwelling catheter  seen today for routine electrophysiology followup.   Since last being seen in our clinic the patient reports doing ***.  he denies chest pain, palpitations, dyspnea, PND, orthopnea, nausea, vomiting, dizziness, syncope, edema, weight gain, or early satiety.   Review of systems complete and found to be negative unless listed in HPI.   EP Information / Studies Reviewed:    {EKGtoday:28818}      Studies:  Lexiscan 12/2019 > LVEF 64%, no ST segement deviation noted during stress, low risk study, no evidence of ischemia or prior infarction Cardiac Monitor 12/2019 > SB, NSR, ST.  Ave HR 95 bpm, range from 50-135 bpm, 1 run of WCT for 5 beats c/w NSVT, occ PVC's cMRI 04/2020 > normal LV size & wall thickness, low normal systolic function, LVEF 54%, normal RV size & systolic function, no LGE Cardiac Monitor 04/2021 > predominant rhythm NSR, ave HR 78 bpm, range 48-132 bpm, frequent PVC's, ventricular couplets, bigeminal / trigeminal PVC's with PVC load of 18%, atrial triplet & non-sustained atrial tachycardia with episode lasting 17 beats, max HR of 169 bpm   Arrhythmia / AAD PVC's > started on metoprolol 2021, increased in 2023   Flecainide 05/2021   Risk Assessment/Calculations:     No BP recorded.  {Refresh Note OR Click here to enter BP  :1}***        Physical Exam:   VS:  There were no vitals taken for this visit.   Wt Readings from Last 3 Encounters:  03/01/23 160 lb (72.6 kg)  02/24/23 160 lb (72.6 kg)  01/15/23 165 lb (74.8 kg)      GEN: Well nourished, well developed in no acute distress NECK: No JVD; No carotid bruits CARDIAC: {EPRHYTHM:28826}, no murmurs, rubs, gallops RESPIRATORY:  Clear to auscultation without rales, wheezing or rhonchi  ABDOMEN: Soft, non-tender, non-distended EXTREMITIES:  No edema; No deformity   ASSESSMENT AND PLAN:    PVC's  High burden on prior cardiac monitor, 18% with symptoms of palpitations.  -continue Toprol 50 mg BID  -increase flecainide to 100 mg BID  -follow up EKG in one week with RN -EKG with stable intervals but continues to have PVC's every 5th beat    Hx DVT RUE  -He completed his Eliquis from 07/2022 - 10/2022  -now off    Finger Numbness -encouraged patient to follow up with PCP -distribution on exam consistent with radial distribution  -query if related to his use of his wheelchair / mechanical injury, defer to primary   Follow up with {WUJWJ:19147} {EPFOLLOW WG:95621}  Signed, Canary Brim, NP-C, AGACNP-BC [facility] - Electrophysiology  05/01/2023, 1:34 PM

## 2023-05-04 ENCOUNTER — Encounter: Payer: Self-pay | Admitting: Pulmonary Disease

## 2023-05-07 ENCOUNTER — Other Ambulatory Visit: Payer: Self-pay

## 2023-05-07 MED ORDER — METOPROLOL SUCCINATE ER 50 MG PO TB24: 50.0000 mg | ORAL_TABLET | Freq: Two times a day (BID) | ORAL | 0 refills | Status: DC

## 2023-05-08 ENCOUNTER — Telehealth: Payer: Self-pay | Admitting: Cardiology

## 2023-05-08 NOTE — Telephone Encounter (Signed)
 Spoke with patient and shared Brandi's response:  He was on my schedule recently to be seen. I am not sure if he has transportation issues. He is on flecainide  and needs to be seen in person. Can we get him re-scheduled to be seen?  Brandi    Patient verbalized understanding.  Will forward to EP scheduler to reach out to patient and schedule office visit with Daphne Barrack, NP or Charlies Arthur, PA-C or Jodie Passey, PA-C, or Dr. Inocencio if any openings.

## 2023-05-08 NOTE — Telephone Encounter (Signed)
  Per MyChart scheduling message:  Initial complaint: Heart skipping / arrhythmia occasionally   Patient c/o Palpitations: STAT if patient c/o lightheadedness, shortness of breath, or chest pain  How long have you had palpitations/irregular HR/ Afib? Are you having the symptoms now?   Are you currently experiencing lightheadedness, SOB or CP?   Do you have a history of afib (atrial fibrillation) or irregular heart rhythm?   Have you checked your BP or HR? (document readings if available):   Are you experiencing any other symptoms?     For about the past week. I'm not experiencing any symptoms right now. I do have a history of irregular heart  rhythms. My blood pressure is 124/76. My pulse is 76. I feel tightness or pressure in my chest sometimes, but not sharp pain. I am going through a lot of stress right now too. My Mom passed away in Dec 09, 2023 and I'm worrying about my inheritance affecting my benefits and things.

## 2023-05-08 NOTE — Telephone Encounter (Signed)
 Patient reports he is having palpitations a couple times per day for the past week. These episodes last a few seconds to 1 minute before resolving. He also reports having some chest tightness/pressure when these episodes occur.   He has a hx of PVCs. He does state he is experiencing increased stress as a result of his mother passing in October, he is worried about his inheritance affecting his benefits. He is hydrating and sleeping well per his report. Denies caffeine use.  He states he started taking OTC iron  and potassium supplement about 1 month ago. Added to med list. He reports compliance with taking flecainide  and metoprolol .  BP 124/76, HR 76.  Patient would like to know if he could wear another heart monitor.  Will forward to Creighton Doffing, NP to review and advise.

## 2023-05-12 NOTE — Telephone Encounter (Signed)
 Pt calling to get a  update in regards to heart monitor. Please advise

## 2023-05-12 NOTE — Telephone Encounter (Signed)
 Returned call to patient, explained to him Cody Das recommended he be seen in office. Scheduled patient appt with Brandi for 05/22/23 at 3:35PM. Informed patient he will come to new office location at [address] [facility]. Patient verbalized understanding and expressed appreciation for call.

## 2023-05-13 DIAGNOSIS — L89224 Pressure ulcer of left hip, stage 4: Secondary | ICD-10-CM | POA: Diagnosis not present

## 2023-05-13 DIAGNOSIS — L89154 Pressure ulcer of sacral region, stage 4: Secondary | ICD-10-CM | POA: Diagnosis not present

## 2023-05-13 DIAGNOSIS — L89612 Pressure ulcer of right heel, stage 2: Secondary | ICD-10-CM | POA: Diagnosis not present

## 2023-05-13 DIAGNOSIS — Z933 Colostomy status: Secondary | ICD-10-CM | POA: Diagnosis not present

## 2023-05-13 DIAGNOSIS — L89214 Pressure ulcer of right hip, stage 4: Secondary | ICD-10-CM | POA: Diagnosis not present

## 2023-05-20 ENCOUNTER — Other Ambulatory Visit: Payer: Self-pay | Admitting: Cardiology

## 2023-05-21 NOTE — Progress Notes (Deleted)
  Electrophysiology Office Note:   Date:  05/21/2023  ID:  Anthony [name], DOB 1970/10/24, MRN [id number]  Primary Cardiologist: Gaylyn Keas, MD Primary Heart Failure: None Electrophysiologist: Will Cortland Ding, MD  {Click to update primary MD,subspecialty MD or APP then REFRESH:1}    History of Present Illness:   Anthony [name] is a 53 y.o. male with h/o PVC's, HTN, HLD, wheelchair bound in setting of spina bifida, chronic indwelling catheter  seen today for routine electrophysiology followup.   Since last being seen in our clinic the patient reports doing ***.  he denies chest pain, palpitations, dyspnea, PND, orthopnea, nausea, vomiting, dizziness, syncope, edema, weight gain, or early satiety.   Review of systems complete and found to be negative unless listed in HPI.   EP Information / Studies Reviewed:    {EKGtoday:28818}      Studies:  Lexiscan  12/2019 > LVEF 64%, no ST segement deviation noted during stress, low risk study, no evidence of ischemia or prior infarction Cardiac Monitor 12/2019 > SB, NSR, ST.  Ave HR 95 bpm, range from 50-135 bpm, 1 run of WCT for 5 beats c/w NSVT, occ PVC's cMRI 04/2020 > normal LV size & wall thickness, low normal systolic function, LVEF 54%, normal RV size & systolic function, no LGE Cardiac Monitor 04/2021 > predominant rhythm NSR, ave HR 78 bpm, range 48-132 bpm, frequent PVC's, ventricular couplets, bigeminal / trigeminal PVC's with PVC load of 18%, atrial triplet & non-sustained atrial tachycardia with episode lasting 17 beats, max HR of 169 bpm   Arrhythmia / AAD PVC's > started on metoprolol  2021, increased in 2023   Flecainide  05/2021   Risk Assessment/Calculations:     No BP recorded.  {Refresh Note OR Click here to enter BP  :1}***        Physical Exam:   VS:  There were no vitals taken for this visit.   Wt Readings from Last 3 Encounters:  03/01/23 160 lb (72.6 kg)  02/24/23 160 lb (72.6 kg)  01/15/23 165 lb (74.8 kg)      GEN: Well nourished, well developed in no acute distress NECK: No JVD; No carotid bruits CARDIAC: {EPRHYTHM:28826}, no murmurs, rubs, gallops RESPIRATORY:  Clear to auscultation without rales, wheezing or rhonchi  ABDOMEN: Soft, non-tender, non-distended EXTREMITIES:  No edema; No deformity   ASSESSMENT AND PLAN:    PVC's  High burden on prior cardiac monitor, 18% with symptoms of palpitations.  -continue Toprol  50 mg BID  -increase flecainide  to 100 mg BID  -follow up EKG in one week with RN -EKG with stable intervals but continues to have PVC's every 5th beat    Hx DVT RUE  -He completed his Eliquis  from 07/2022 - 10/2022  -now off    Finger Numbness -encouraged patient to follow up with PCP -distribution on exam consistent with radial distribution  -query if related to his use of his wheelchair / mechanical injury, defer to primary   Follow up with {WUJWJ:19147} {EPFOLLOW WG:95621}  Signed, Creighton Doffing, NP-C, AGACNP-BC [facility] - Electrophysiology  05/21/2023, 6:09 PM

## 2023-05-22 ENCOUNTER — Ambulatory Visit: Admitting: Pulmonary Disease

## 2023-05-26 ENCOUNTER — Other Ambulatory Visit: Payer: Self-pay | Admitting: Cardiology

## 2023-05-26 NOTE — Progress Notes (Unsigned)
 Cardiology Office Note:  .   Date:  05/26/2023  ID:  Anthony [name], DOB 07-Sep-1970, MRN [id number] PCP: Glenn Lange, DO  [facility] Providers Cardiologist:  Gaylyn Keas, MD Electrophysiologist:  Lei Pump, MD {  History of Present Illness: .   Anthony [name] is a 53 y.o. male w/PMHx of  spina bifida wheelchair-bound and chronic indwelling foley catheter  PVCs DVT (RUE) > completed his Eliquis  from 07/2022 - 10/2022   2021 Nuclear stress test showed no ischemia and 2D echo was normal.   Event monitor showed a 5 beat run of WCT and occasional PVCs.   Cardiac MRI showed normal LV size and wall thickness with EF 54%, normal RV size and systolic function EF 57% and no LGE to suggest scar and was started on Toprol .  2023: Monitor noted 18% PVC burden and referred to Dr. Lawana Pray and started on Flecainide    Following with gen cards and EP  Last w/Brandi on 12/31/22, noted some palpitations, c/o b/l finger numbness, PVCs on his EKG his flecainide  increased with planned f/u EKG Advised f/u with his PMD for numbness  F/u EKG completed and stable   Today's visit is scheduled as a flecainide  visit ROS:   He reports a couple weeks ago an episode of palpitations, perhaps flutter that seemed associated with some pressure in his chest, took some slow deep breaths and settled. This has happened a couple times, random, not with exertion. Last a couple minutes No radiation, no associated symptoms otherwise  He reports since his mother's death/living at her home with her boyfriend is stressful/less then ideal, and thinks it plays a role in his symptoms  This morning he felt a fast heart rate, no pressure, but this felt different, and made him feel lightheaded  His EKG HR 106 today > he reports that he almost didn't make it up, says fear of heights and coming to the 5th floor was hard for him  He inquires about less frequent visits, his hard to get here, [employer] office  would be much easier for him  He has chronic b/l hip wounds for years, when he changed the dressing on the left had a bit of an odor, thought he may have fever,  but temp was 99.  He will call his PMD today, who manages his wounds/wound care No chills or overt symptoms of illness He reports wound does not look infected, no drainage, pus, erythema  Arrhythmia/AAD hx PVCs > flecainide  started May 2023  Studies Reviewed: Aaron Aas    EKG done today and reviewed by myself:  ST 106bpm, PR , QRS 90ms, QTc , no acute/ischemic looking changes  04/25/2020: c.MRI IMPRESSION: 1. Normal LV size and wall thickness, with low normal systolic function (EF 54%) 2.  Normal RV size and systolic function (EF 57%) 3.  No late gadolinium enhancement to suggest myocardial scar     01/05/2020: stress myoview  Nuclear stress EF: 64%. The left ventricular ejection fraction is normal (55-65%). There was no ST segment deviation noted during stress. This is a low risk study. There is no evidence of ischemia or previous infarction . The study is normal.     01/03/2020: TTE  1. Left ventricular ejection fraction, by estimation, is 50 to 55%. The  left ventricle has low normal function. The left ventricle has no regional  wall motion abnormalities. Left ventricular diastolic parameters were  normal.   2. Right ventricular systolic function is normal. The right ventricular  size is normal.   3. The mitral valve is normal in structure. Trivial mitral valve  regurgitation. No evidence of mitral stenosis.   4. The aortic valve is tricuspid. Aortic valve regurgitation is not  visualized. No aortic stenosis is present.   5. The inferior vena cava is normal in size with greater than 50%  respiratory variability, suggesting right atrial pressure of 3 mmHg.      Risk Assessment/Calculations:    Physical Exam:   VS:  There were no vitals taken for this visit.   Wt Readings from Last 3 Encounters:  03/01/23  160 lb (72.6 kg)  02/24/23 160 lb (72.6 kg)  01/15/23 165 lb (74.8 kg)    GEN: Well nourished, well developed in no acute distress NECK: No JVD; No carotid bruits CARDIAC: RRR, no murmurs, rubs, gallops RESPIRATORY:  CTA b/l without rales, wheezing or rhonchi  ABDOMEN: Soft, non-tender, non-distended EXTREMITIES: No edema; No deformity   ASSESSMENT AND PLAN: .    PVCs Chronic flecainide  W/metoprolol  stable intervals No PVCs on his EKG today   Symptoms as discussed Zio monitor placed given his palpitations this morning associated with lightheadedness  HR by exam settled into the 96-100bpm  HTN Looks ok  3. Chest pressure Associated with palpitations Not exertional (when transferring, upper body exertion/propelling his wheelchair) Deep breaths settle both the palpitations and pressure Negative stress test 2021 follow ER precautions discussed, if any escalation in symptoms  Dispo: will have him back in 3 mo, sooner if needed, will plan to have him see Maylene Spear, NP at the [facility] office, will be much easier for him.  Signed, Debbie Fails, PA-C

## 2023-05-27 ENCOUNTER — Ambulatory Visit: Attending: Physician Assistant | Admitting: Physician Assistant

## 2023-05-27 ENCOUNTER — Other Ambulatory Visit

## 2023-05-27 ENCOUNTER — Encounter: Payer: Self-pay | Admitting: Physician Assistant

## 2023-05-27 VITALS — BP 104/60 | HR 106 | Ht 65.0 in | Wt 165.0 lb

## 2023-05-27 DIAGNOSIS — Z79899 Other long term (current) drug therapy: Secondary | ICD-10-CM

## 2023-05-27 DIAGNOSIS — R55 Syncope and collapse: Secondary | ICD-10-CM

## 2023-05-27 DIAGNOSIS — I10 Essential (primary) hypertension: Secondary | ICD-10-CM | POA: Diagnosis not present

## 2023-05-27 DIAGNOSIS — Z5181 Encounter for therapeutic drug level monitoring: Secondary | ICD-10-CM

## 2023-05-27 DIAGNOSIS — R0789 Other chest pain: Secondary | ICD-10-CM

## 2023-05-27 DIAGNOSIS — I493 Ventricular premature depolarization: Secondary | ICD-10-CM | POA: Diagnosis not present

## 2023-05-27 DIAGNOSIS — R42 Dizziness and giddiness: Secondary | ICD-10-CM

## 2023-05-27 DIAGNOSIS — R002 Palpitations: Secondary | ICD-10-CM

## 2023-05-27 NOTE — Patient Instructions (Addendum)
 Medication Instructions:   Your physician recommends that you continue on your current medications as directed. Please refer to the Current Medication list given to you today.   *If you need a refill on your cardiac medications before your next appointment, please call your pharmacy*   Lab Work: NONE ORDERED  TODAY    If you have labs (blood work) drawn today and your tests are completely normal, you will receive your results only by: MyChart Message (if you have MyChart) OR A paper copy in the mail If you have any lab test that is abnormal or we need to change your treatment, we will call you to review the results.   Testing/Procedures: Your physician has recommended that you wear an event monitor. Event monitors are medical devices that record the heart's electrical activity. Doctors most often us  these monitors to diagnose arrhythmias. Arrhythmias are problems with the speed or rhythm of the heartbeat. The monitor is a small, portable device. You can wear one while you do your normal daily activities. This is usually used to diagnose what is causing palpitations/syncope (passing out).    Follow-Up: At [facility], you and your health needs are our priority.  As part of our continuing mission to provide you with exceptional heart care, our providers are all part of one team.  This team includes your primary Cardiologist (physician) and Advanced Practice Providers or APPs (Physician Assistants and Nurse Practitioners) who all work together to provide you with the care you need, when you need it.   Your next appointment:    3 month(s)  IN [place]    Provider:    You may see Lyle San     We recommend signing up for the patient portal called "MyChart".  Sign up information is provided on this After Visit Summary.  MyChart is used to connect with patients for Virtual Visits (Telemedicine).  Patients are able to view lab/test results, encounter notes, upcoming  appointments, etc.  Non-urgent messages can be sent to your provider as well.   To learn more about what you can do with MyChart, go to [email or web address].    Other Instructions      ZIO AT Long term monitor-Live Telemetry  Your physician has requested you wear a ZIO patch monitor for 14 days.  This is a single patch monitor. [employer] supplies one patch monitor per enrollment. Additional  stickers are not available.  Please do not apply patch if you will be having a Nuclear Stress Test, Echocardiogram, Cardiac CT, MRI,  or Chest Xray during the period you would be wearing the monitor. The patch cannot be worn during  these tests. You cannot remove and re-apply the ZIO AT patch monitor.  Your ZIO patch monitor will be mailed 3 day USPS to your address on file. It may take 3-5 days to  receive your monitor after you have been enrolled.  Once you have received your monitor, please review the enclosed instructions. Your monitor has  already been registered assigning a specific monitor serial # to you.   Billing and Patient Assistance Program information  Anthony [name] has been supplied with any insurance information on record for billing. [employer] offers a sliding scale Patient Assistance Program for patients without insurance, or whose  insurance does not completely cover the cost of the ZIO patch monitor. You must apply for the  Patient Assistance Program to qualify for the discounted rate. To apply, call [employer] at [phone],  select option 4, select option 2 ,  ask to apply for the Patient Assistance Program, (you can request an  interpreter if needed). [employer] will ask your household income and how many people are in your  household. [employer] will quote your out-of-pocket cost based on this information. They will also be able  to set up a 12 month interest free payment plan if needed.  Applying the monitor   Shave hair from upper left chest.  Hold the abrader disc by orange tab. Rub  the abrader in 40 strokes over left upper chest as indicated in  your monitor instructions.  Clean area with 4 enclosed alcohol pads. Use all pads to ensure the area is cleaned thoroughly. Let  dry.  Apply patch as indicated in monitor instructions. Patch will be placed under collarbone on left side of  chest with arrow pointing upward.  Rub patch adhesive wings for 2 minutes. Remove the white label marked "1". Remove the white label  marked "2". Rub patch adhesive wings for 2 additional minutes.  While looking in a mirror, press and release button in center of patch. A small green light will flash 3-4  times. This will be your only indicator that the monitor has been turned on.  Do not shower for the first 24 hours. You may shower after the first 24 hours.  Press the button if you feel a symptom. You will hear a small click. Record Date, Time and Symptom in  the Patient Log.   Starting the Gateway  In your kit there is a Audiological scientist box the size of a cellphone. This is Buyer, retail. It transmits all your  recorded data to [facility]. This box must always stay within 10 feet of you. Open the box and push the *  button. There will be a light that blinks orange and then green a few times. When the light stops  blinking, the Gateway is connected to the ZIO patch. Call [employer] at [phone] to confirm your monitor is transmitting.  Returning your monitor  Remove your patch and place it inside the Gateway. In the lower half of the Gateway there is a white  bag with prepaid postage on it. Place Gateway in bag and seal. Mail package back to [place] as soon as  possible. Your physician should have your final report approximately 7 days after you have mailed back  your monitor. Call [facility] Customer Care at [phone] if you have questions regarding your ZIO AT  patch monitor. Call them immediately if you see an orange light blinking on your monitor.  If your monitor falls off  in less than 4 days, contact our Monitor department at [phone]. If your  monitor becomes loose or falls off after 4 days call [employer] at [phone] for suggestions on  securing your monitor

## 2023-05-27 NOTE — Progress Notes (Unsigned)
 Applied a 14 day ZIo AT monitor to patient in the office  [place] to read

## 2023-05-28 ENCOUNTER — Ambulatory Visit: Admitting: Student

## 2023-05-28 DIAGNOSIS — R55 Syncope and collapse: Secondary | ICD-10-CM | POA: Diagnosis not present

## 2023-06-04 ENCOUNTER — Ambulatory Visit: Admitting: Orthopedic Surgery

## 2023-06-09 ENCOUNTER — Other Ambulatory Visit: Payer: Self-pay | Admitting: Cardiology

## 2023-06-11 ENCOUNTER — Ambulatory Visit (INDEPENDENT_AMBULATORY_CARE_PROVIDER_SITE_OTHER): Admitting: Orthopedic Surgery

## 2023-06-11 DIAGNOSIS — L89220 Pressure ulcer of left hip, unstageable: Secondary | ICD-10-CM | POA: Diagnosis not present

## 2023-06-11 DIAGNOSIS — L97111 Non-pressure chronic ulcer of right thigh limited to breakdown of skin: Secondary | ICD-10-CM | POA: Diagnosis not present

## 2023-06-11 MED ORDER — CIPROFLOXACIN HCL 500 MG PO TABS: 500.0000 mg | ORAL_TABLET | Freq: Two times a day (BID) | ORAL | 0 refills | Status: DC

## 2023-06-16 ENCOUNTER — Encounter: Payer: Self-pay | Admitting: Orthopedic Surgery

## 2023-06-16 NOTE — Progress Notes (Signed)
 Office Visit Note   Patient: Anthony [name]           Date of Birth: [birth year]           MRN: [id number] Visit Date: 06/11/2023              Requested by: Glenn Lange, DO [address] [place],  [place] [address] PCP: Glenn Lange, DO  Chief Complaint  Patient presents with   Left Hip - Pain   Right Hip - Pain      HPI: Patient is a 53 year old gentleman who is seen in follow-up for bilateral hip and sacral decubitus ulcers.  Patient has been packing the left hip wound with iodoform gauze.  Patient has chronic sacral ulcers.  Patient states that he is recently been having fevers with a temperature up to 99.  Patient states he has an odor from the left hip wound.  Patient states he also has a urethral ulcer.  Assessment & Plan: Visit Diagnoses:  1. Decubitus ulcer of hip, left, unstageable (HCC)   2. Ulcer of trochanteric region of right hip, limited to breakdown of skin [facility])     Plan: Patient will follow-up with urology for his urethral ulcer.  Prescription sent in for Cipro .  Patient had tissue cultures that showed multiple organisms but no staph.  Continue to pack the hip wound and sacral decubitus wounds.  Follow-Up Instructions: Return in about 2 weeks (around 06/25/2023).   Ortho Exam  Patient is alert, oriented, no adenopathy, well-dressed, normal affect, normal respiratory effort. Examination the right hip wound is closed.  Patient has open bilateral sacral decubitus ulcers with healthy granulation tissue that extends down to bone.  The packing was removed from the left hip.  There is no surrounding cellulitis.  There is no purulent drainage.  Imaging: No results found. No images are attached to the encounter.  Labs: Lab Results  Component Value Date   ESRSEDRATE 120 (H) 09/06/2022   ESRSEDRATE 140 (H) 08/06/2022   CRP 20.2 (H) 09/06/2022   CRP 16.4 (H) 08/06/2022   REPTSTATUS 03/01/2023 FINAL 02/24/2023   GRAMSTAIN  09/06/2022    FEW WBC PRESENT,  PREDOMINANTLY MONONUCLEAR FEW GRAM POSITIVE COCCI IN PAIRS FEW GRAM NEGATIVE RODS Performed at [facility], [address], [place], [place] [address]    CULT  02/24/2023    NO GROWTH 5 DAYS Performed at [facility], [address], [place], [place] [address]    [facility] ENTEROCOCCUS FAECIUM 08/20/2022     Lab Results  Component Value Date   ALBUMIN  2.4 (L) 02/24/2023   ALBUMIN  2.1 (L) 09/11/2022   ALBUMIN  2.1 (L) 09/10/2022    Lab Results  Component Value Date   MG 2.0 02/24/2023   MG 2.0 08/26/2022   MG 2.2 08/21/2022   Lab Results  Component Value Date   VD25OH 42.36 08/06/2022    No results found for: "PREALBUMIN"    Latest Ref Rng & Units 03/01/2023   10:56 AM 02/24/2023    7:25 PM 09/11/2022    7:54 AM  CBC EXTENDED  WBC 4.0 - 10.5 K/uL 7.9  11.9  9.6   RBC 4.22 - 5.81 MIL/uL 4.22  4.49  3.55   Hemoglobin 13.0 - 17.0 g/dL 9.8  96.2  8.2   HCT 95.2 - 52.0 % 34.1  36.3  28.4   Platelets 150 - 400 K/uL 613  775  552   NEUT# 1.7 - 7.7 K/uL 4.7  8.3  5.5   Lymph# 0.7 - 4.0 K/uL 1.6  1.6  1.5      There is no height or weight on file to calculate BMI.  Orders:  No orders of the defined types were placed in this encounter.  Meds ordered this encounter  Medications   ciprofloxacin  (CIPRO ) 500 MG tablet    Sig: Take 1 tablet (500 mg total) by mouth 2 (two) times daily.    Dispense:  40 tablet    Refill:  0     Procedures: No procedures performed  Clinical Data: No additional findings.  ROS:  All other systems negative, except as noted in the HPI. Review of Systems  Objective: Vital Signs: There were no vitals taken for this visit.  Specialty Comments:  No specialty comments available.  PMFS History: Patient Active Problem List   Diagnosis Date Noted   Thrombophilia (HCC) 09/08/2022   Advanced Directives in ACP section 09/07/2022   Polymicrobial bacterial infection 08/28/2022   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 08/16/2022   Pressure  injury, stage 4, with infection (HCC) 08/05/2022   Acute deep vein thrombosis (DVT) of axillary vein of right upper extremity (HCC) 08/02/2022   Anemia of infection and chronic disease 07/26/2022   Acute osteomyelitis of pelvic region and thigh (HCC) 07/26/2022   Chronic osteomyelitis of pelvic region (HCC) 07/26/2022   Febrile illness 02/27/2022   Pyogenic arthritis of left hip (HCC)    Septic arthritis of bilateral hips (HCC) 09/03/2021   Neurogenic bladder 03/06/2017   Suprapubic catheter (HCC) 03/06/2017   Paraplegia (HCC) 01/29/2017   Decubitus ulcer due to spina bifida (HCC) 01/27/2017   Hyperlipidemia 03/11/2014   Spina bifida of lumbar region (HCC) 09/30/2012   Acute Right shoulder pain 01/27/2011   Allergic rhinitis 06/01/2007   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   VENOUS INSUFFICIENCY, CHRONIC 03/19/2006   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 01/24/2017   Osteomyelitis (HCC)    left hip   Paraplegia (HCC)    secondary to Spina Bifida   Polymicrobial bacteremia with sepsis:    Pressure injury of skin of left buttock 05/13/2021   PVCs (premature ventricular contractions)    S/P debridement 10/30/2021   Septic shock (HCC)    due to osteomyelitis   Slow transit constipation 03/06/2017   Spina bifida    Status post debridement 01/24/2017   Wheelchair bound     Family History  Problem Relation Age of Onset   CAD Father        died age 102 from MI   Sudden Cardiac Death Sister        age 28   CAD Maternal Grandfather        died age 74 MI    Past Surgical History:  Procedure Laterality Date   APPLICATION OF WOUND VAC  09/11/2021   Procedure:  APPLICATION OF WOUND VAC;  Surgeon: Timothy Ford, MD;  Location: [facility] OR;  Service: Orthopedics;;   BACK SURGERY     BUBBLE STUDY  09/09/2021   Procedure: BUBBLE STUDY;  Surgeon: Jacqueline Matsu, MD;  Location: [facility] ENDOSCOPY;  Service: Cardiovascular;;  HIP SURGERY     I & D EXTREMITY Left 09/11/2021   Procedure: LEFT HIP DEBRIDEMENT AND REMOVAL FEMORAL HEAD;  Surgeon: Timothy Ford, MD;  Location: [facility] OR;  Service: Orthopedics;  Laterality: Left;   I & D EXTREMITY Left 09/13/2021   Procedure: DEBRIDEMENT LEFT HIP;  Surgeon: Timothy Ford, MD;  Location: [facility] OR;  Service: Orthopedics;  Laterality: Left;   I & D EXTREMITY Left 09/18/2021   Procedure: DEBRIDEMENT LEFT HIP, WOUND VAC EXCHANGE;  Surgeon: Timothy Ford, MD;  Location: [facility] OR;  Service: Orthopedics;  Laterality: Left;   I & D EXTREMITY Left 10/30/2021   Procedure: LEFT HIP DEBRIDEMENT WITH KERECIS PLACEMENT;  Surgeon: Timothy Ford, MD;  Location[facility] OR;  Service: Orthopedics;  Laterality: Left;   I & D EXTREMITY Bilateral 08/20/2022   Procedure: BILATERAL HIP DEBRIDEMENT;  Surgeon: Timothy Ford, MD;  Location: [facility] OR;  Service: Orthopedics;  Laterality: Bilateral;   INCISION AND DRAINAGE OF WOUND N/A 01/24/2017   Procedure: IRRIGATION AND DEBRIDEMENT WOUND- BUTTOCK ABSCESS;  Surgeon: Lockie Rima, MD;  Location: [facility] ORS;  Service: General;  Laterality: N/A;   LAPAROSCOPIC LOOP COLOSTOMY N/A 08/12/2022   Procedure: LAPAROSCOPIC LOOP SIGMOID COLOSTOMY;  Surgeon: Enid Harry, MD;  Location: [facility] OR;  Service: General;  Laterality: N/A;   [name] WITHOUT CARDIOVERSION N/A 09/09/2021   Procedure: TRANSESOPHAGEAL ECHOCARDIOGRAM ([name]);  Surgeon: Jacqueline Matsu, MD;  Location: [facility] ENDOSCOPY;  Service: Cardiovascular;  Laterality: N/A;   [name] WITHOUT CARDIOVERSION N/A 08/04/2022   Procedure: TRANSESOPHAGEAL ECHOCARDIOGRAM;  Surgeon: Hazle Lites, MD;  Location: [facility] INVASIVE CV LAB;  Service: Cardiovascular;  Laterality: N/A;   Social History    Occupational History   Not on file  Tobacco Use   Smoking status: Former    Types: Cigarettes    Passive exposure: Past   Smokeless tobacco: Never  Vaping Use   Vaping status: Never Used  Substance and Sexual Activity   Alcohol use: No   Drug use: Not Currently   Sexual activity: Not Currently

## 2023-06-22 ENCOUNTER — Telehealth: Payer: Self-pay

## 2023-06-22 NOTE — Telephone Encounter (Signed)
 Patient calls nurse line requesting Ostomy Supplies.   He reports his supplies are due for re-certification from [employer].   Patient has not been seen in over one year. Will more than likely need an apt for insurance coverage of supplies.   It appears supplies were started during his July admission.   He declined making an apt at this time.   Will forward to PCP.

## 2023-06-24 DIAGNOSIS — L89214 Pressure ulcer of right hip, stage 4: Secondary | ICD-10-CM | POA: Diagnosis not present

## 2023-06-24 DIAGNOSIS — L89224 Pressure ulcer of left hip, stage 4: Secondary | ICD-10-CM | POA: Diagnosis not present

## 2023-06-24 DIAGNOSIS — L89612 Pressure ulcer of right heel, stage 2: Secondary | ICD-10-CM | POA: Diagnosis not present

## 2023-06-24 DIAGNOSIS — Z933 Colostomy status: Secondary | ICD-10-CM | POA: Diagnosis not present

## 2023-06-24 DIAGNOSIS — L89154 Pressure ulcer of sacral region, stage 4: Secondary | ICD-10-CM | POA: Diagnosis not present

## 2023-06-25 ENCOUNTER — Ambulatory Visit: Admitting: Orthopedic Surgery

## 2023-06-25 NOTE — Telephone Encounter (Signed)
 Called patient.  He reports [employer] sent him a message stating his supplies were shipped.   Nothing further to do.

## 2023-06-29 DIAGNOSIS — R55 Syncope and collapse: Secondary | ICD-10-CM | POA: Diagnosis not present

## 2023-06-30 ENCOUNTER — Ambulatory Visit: Payer: Self-pay | Admitting: Physician Assistant

## 2023-06-30 ENCOUNTER — Encounter: Payer: Self-pay | Admitting: *Deleted

## 2023-07-07 DIAGNOSIS — L89612 Pressure ulcer of right heel, stage 2: Secondary | ICD-10-CM | POA: Diagnosis not present

## 2023-07-07 DIAGNOSIS — L89214 Pressure ulcer of right hip, stage 4: Secondary | ICD-10-CM | POA: Diagnosis not present

## 2023-07-07 DIAGNOSIS — L89224 Pressure ulcer of left hip, stage 4: Secondary | ICD-10-CM | POA: Diagnosis not present

## 2023-07-07 DIAGNOSIS — Z933 Colostomy status: Secondary | ICD-10-CM | POA: Diagnosis not present

## 2023-07-07 DIAGNOSIS — L89154 Pressure ulcer of sacral region, stage 4: Secondary | ICD-10-CM | POA: Diagnosis not present

## 2023-07-13 ENCOUNTER — Other Ambulatory Visit: Payer: Self-pay | Admitting: Cardiology

## 2023-07-29 ENCOUNTER — Ambulatory Visit: Payer: Medicare HMO | Admitting: Dermatology

## 2023-07-31 ENCOUNTER — Other Ambulatory Visit: Payer: Self-pay | Admitting: Cardiology

## 2023-08-03 ENCOUNTER — Other Ambulatory Visit: Payer: Self-pay | Admitting: Cardiology

## 2023-08-03 MED ORDER — METOPROLOL SUCCINATE ER 50 MG PO TB24
ORAL_TABLET | ORAL | 0 refills | Status: DC
Start: 2023-08-03 — End: 2023-08-03

## 2023-08-03 MED ORDER — METOPROLOL SUCCINATE ER 50 MG PO TB24: ORAL_TABLET | ORAL | 0 refills | Status: AC

## 2023-08-20 DIAGNOSIS — Z933 Colostomy status: Secondary | ICD-10-CM | POA: Diagnosis not present

## 2023-08-20 DIAGNOSIS — L89154 Pressure ulcer of sacral region, stage 4: Secondary | ICD-10-CM | POA: Diagnosis not present

## 2023-08-20 DIAGNOSIS — L89224 Pressure ulcer of left hip, stage 4: Secondary | ICD-10-CM | POA: Diagnosis not present

## 2023-08-20 DIAGNOSIS — L89612 Pressure ulcer of right heel, stage 2: Secondary | ICD-10-CM | POA: Diagnosis not present

## 2023-08-20 DIAGNOSIS — L89214 Pressure ulcer of right hip, stage 4: Secondary | ICD-10-CM | POA: Diagnosis not present

## 2023-08-21 DIAGNOSIS — L89224 Pressure ulcer of left hip, stage 4: Secondary | ICD-10-CM | POA: Diagnosis not present

## 2023-08-21 DIAGNOSIS — L89612 Pressure ulcer of right heel, stage 2: Secondary | ICD-10-CM | POA: Diagnosis not present

## 2023-08-21 DIAGNOSIS — L89154 Pressure ulcer of sacral region, stage 4: Secondary | ICD-10-CM | POA: Diagnosis not present

## 2023-08-21 DIAGNOSIS — L89214 Pressure ulcer of right hip, stage 4: Secondary | ICD-10-CM | POA: Diagnosis not present

## 2023-08-21 DIAGNOSIS — Z933 Colostomy status: Secondary | ICD-10-CM | POA: Diagnosis not present

## 2023-08-27 ENCOUNTER — Encounter: Payer: Self-pay | Admitting: Family Medicine

## 2023-08-27 ENCOUNTER — Other Ambulatory Visit: Payer: Self-pay | Admitting: Family Medicine

## 2023-08-27 ENCOUNTER — Telehealth: Admitting: Family Medicine

## 2023-08-27 DIAGNOSIS — G8220 Paraplegia, unspecified: Secondary | ICD-10-CM

## 2023-08-27 NOTE — Telephone Encounter (Signed)
 Spoke with patient.   Community message sent to [employer] for DME order.   Will await response.   Chiquita JAYSON English, RN

## 2023-08-27 NOTE — Assessment & Plan Note (Signed)
 It is my medical opinion that this pt requires lifelong wheelchair and seat cushion given his imobility from spina bifida and paraplegia.  - DME order placed

## 2023-08-27 NOTE — Progress Notes (Signed)
 Community message sent to [employer] for DME Wheelchair and cushion.   Will await response.   Chiquita JAYSON English, RN

## 2023-08-27 NOTE — Progress Notes (Signed)
 [facility] Telemedicine Visit  Patient consented to have virtual visit and was identified by name and date of birth. Method of visit: Video  Encounter participants: Patient: Anthony [name] - located at home Provider: Twyla Nearing - located at [facility] Others (if applicable): none  Chief Complaint: needs new wheelchair  HPI:  - Pt is requiring documentation for new wheelchair - He has hx of spina bifida and paraplegia - He would like the following wheelchair and seat cushions -Roho 18 x 16 high profile - Marine GAL wheelchair  - frame width 18 - seat depth 16  ROS: per HPI  Pertinent PMHx: spina bifida, paraplegia requiring wheelchair  Exam:  There were no vitals taken for this visit.  Respiratory: Normal work of breathing and speaking in full sentences on room air  Assessment/Plan:  Paraplegia [facility]) It is my medical opinion that this pt requires lifelong wheelchair and seat cushion given his imobility from spina bifida and paraplegia.  - DME order placed     Time spent during visit with patient: 20 minutes

## 2023-08-28 NOTE — Progress Notes (Signed)
 Receipt confirmed by [employer].   Veronda Prude, RN

## 2023-09-01 ENCOUNTER — Other Ambulatory Visit: Payer: Self-pay | Admitting: Family Medicine

## 2023-09-01 DIAGNOSIS — G8220 Paraplegia, unspecified: Secondary | ICD-10-CM

## 2023-09-01 NOTE — Telephone Encounter (Signed)
 Received message from [employer] that they do not carry this wheelchair.   Called [employer]. This is a wheelchair that the provide.   They will need demographics, order, last visit note and referral for PT assessment for wheelchair.   Will forward to provider to place referral order for PT.   I can then fax all required information to [employer] at [phone].  Chiquita JAYSON English, RN

## 2023-09-01 NOTE — Telephone Encounter (Signed)
 Faxed required documents to [employer].   They will contact patient for next steps.   Chiquita JAYSON English, RN

## 2023-09-02 NOTE — Progress Notes (Deleted)
  Electrophysiology Office Note:   Date:  09/02/2023  ID:  Anthony [name], DOB 09/19/1970, MRN [id number]  Primary Cardiologist: Wilbert Bihari, MD Primary Heart Failure: None Electrophysiologist: Will Gladis Norton, MD  {Click to update primary MD,subspecialty MD or APP then REFRESH:1}    History of Present Illness:   Anthony [name] is a 53 y.o. male with h/o PVC's, HTN, HLD, wheelchair bound in setting of spina bifida, chronic indwelling catheter seen today for routine electrophysiology followup.   Since last being seen in our clinic the patient reports doing ***.    He denies chest pain, palpitations, dyspnea, PND, orthopnea, nausea, vomiting, dizziness, syncope, edema, weight gain, or early satiety.   Review of systems complete and found to be negative unless listed in HPI.   EP Information / Studies Reviewed:    EKG is ordered today. Personal review as below.       Arrhythmia / AAD PVC's > started on metoprolol  in 2021, increased 2023  Flecainide  05/2021  Cardiac Monitor 06/2023 > 2.7% PVC burden (down from 18%), sx associated with NSR   Risk Assessment/Calculations:     No BP recorded.  {Refresh Note OR Click here to enter BP  :1}***        Physical Exam:   VS:  There were no vitals taken for this visit.   Wt Readings from Last 3 Encounters:  05/27/23 165 lb (74.8 kg)  03/01/23 160 lb (72.6 kg)  02/24/23 160 lb (72.6 kg)     GEN: Well nourished, well developed in no acute distress NECK: No JVD; No carotid bruits CARDIAC: {EPRHYTHM:28826}, no murmurs, rubs, gallops RESPIRATORY:  Clear to auscultation without rales, wheezing or rhonchi  ABDOMEN: Soft, non-tender, non-distended EXTREMITIES:  No edema; No deformity   ASSESSMENT AND PLAN:    PVC's  High Risk Medication Monitoring: Flecainide   -continue Toprol  50 mg BID  -flecainide  100 mg BID  -EKG with NSR, stable intervals ***   Hx RUE DVT  -completed Eliquis  10/2022   Hypertension  -well controlled on  current regimen ***  Follow up with Dr. Norton {EPFOLLOW LE:71826}  Signed, Daphne Barrack, NP-C, AGACNP-BC [facility] - Electrophysiology  09/02/2023, 9:13 PM

## 2023-09-07 ENCOUNTER — Ambulatory Visit: Admitting: Cardiology

## 2023-09-08 ENCOUNTER — Ambulatory Visit: Attending: Pulmonary Disease | Admitting: Pulmonary Disease

## 2023-09-08 DIAGNOSIS — I493 Ventricular premature depolarization: Secondary | ICD-10-CM

## 2023-09-08 DIAGNOSIS — I10 Essential (primary) hypertension: Secondary | ICD-10-CM

## 2023-09-08 DIAGNOSIS — R002 Palpitations: Secondary | ICD-10-CM

## 2023-09-08 DIAGNOSIS — Z79899 Other long term (current) drug therapy: Secondary | ICD-10-CM

## 2023-10-12 DIAGNOSIS — Z933 Colostomy status: Secondary | ICD-10-CM | POA: Diagnosis not present

## 2023-10-12 DIAGNOSIS — L89612 Pressure ulcer of right heel, stage 2: Secondary | ICD-10-CM | POA: Diagnosis not present

## 2023-10-12 DIAGNOSIS — L89154 Pressure ulcer of sacral region, stage 4: Secondary | ICD-10-CM | POA: Diagnosis not present

## 2023-10-12 DIAGNOSIS — L89214 Pressure ulcer of right hip, stage 4: Secondary | ICD-10-CM | POA: Diagnosis not present

## 2023-10-13 DIAGNOSIS — L89612 Pressure ulcer of right heel, stage 2: Secondary | ICD-10-CM | POA: Diagnosis not present

## 2023-10-13 DIAGNOSIS — Z933 Colostomy status: Secondary | ICD-10-CM | POA: Diagnosis not present

## 2023-10-13 DIAGNOSIS — L89214 Pressure ulcer of right hip, stage 4: Secondary | ICD-10-CM | POA: Diagnosis not present

## 2023-10-13 DIAGNOSIS — L89154 Pressure ulcer of sacral region, stage 4: Secondary | ICD-10-CM | POA: Diagnosis not present

## 2023-10-14 DIAGNOSIS — L89214 Pressure ulcer of right hip, stage 4: Secondary | ICD-10-CM | POA: Diagnosis not present

## 2023-10-14 DIAGNOSIS — L89612 Pressure ulcer of right heel, stage 2: Secondary | ICD-10-CM | POA: Diagnosis not present

## 2023-10-14 DIAGNOSIS — L89154 Pressure ulcer of sacral region, stage 4: Secondary | ICD-10-CM | POA: Diagnosis not present

## 2023-10-14 DIAGNOSIS — Z933 Colostomy status: Secondary | ICD-10-CM | POA: Diagnosis not present

## 2023-10-23 ENCOUNTER — Inpatient Hospital Stay (HOSPITAL_COMMUNITY)
Admission: EM | Admit: 2023-10-23 | Discharge: 2023-11-02 | DRG: 871 | Disposition: A | Discharge status: Skilled Nursing Facility | Attending: Family Medicine | Admitting: Family Medicine

## 2023-10-23 ENCOUNTER — Emergency Department (HOSPITAL_COMMUNITY)

## 2023-10-23 ENCOUNTER — Ambulatory Visit: Attending: Family Medicine | Admitting: Physical Therapy

## 2023-10-23 ENCOUNTER — Encounter (HOSPITAL_COMMUNITY): Payer: Self-pay | Admitting: *Deleted

## 2023-10-23 ENCOUNTER — Other Ambulatory Visit: Payer: Self-pay

## 2023-10-23 DIAGNOSIS — Z7401 Bed confinement status: Secondary | ICD-10-CM | POA: Diagnosis not present

## 2023-10-23 DIAGNOSIS — R161 Splenomegaly, not elsewhere classified: Secondary | ICD-10-CM | POA: Diagnosis not present

## 2023-10-23 DIAGNOSIS — Z9359 Other cystostomy status: Secondary | ICD-10-CM | POA: Diagnosis not present

## 2023-10-23 DIAGNOSIS — Q059 Spina bifida, unspecified: Secondary | ICD-10-CM | POA: Diagnosis not present

## 2023-10-23 DIAGNOSIS — M48061 Spinal stenosis, lumbar region without neurogenic claudication: Secondary | ICD-10-CM | POA: Diagnosis not present

## 2023-10-23 DIAGNOSIS — S61031A Puncture wound without foreign body of right thumb without damage to nail, initial encounter: Secondary | ICD-10-CM | POA: Diagnosis not present

## 2023-10-23 DIAGNOSIS — D75839 Thrombocytosis, unspecified: Secondary | ICD-10-CM | POA: Diagnosis not present

## 2023-10-23 DIAGNOSIS — Z7901 Long term (current) use of anticoagulants: Secondary | ICD-10-CM

## 2023-10-23 DIAGNOSIS — Z79899 Other long term (current) drug therapy: Secondary | ICD-10-CM | POA: Diagnosis not present

## 2023-10-23 DIAGNOSIS — M86651 Other chronic osteomyelitis, right thigh: Secondary | ICD-10-CM | POA: Diagnosis present

## 2023-10-23 DIAGNOSIS — S31000A Unspecified open wound of lower back and pelvis without penetration into retroperitoneum, initial encounter: Secondary | ICD-10-CM

## 2023-10-23 DIAGNOSIS — N319 Neuromuscular dysfunction of bladder, unspecified: Secondary | ICD-10-CM | POA: Diagnosis not present

## 2023-10-23 DIAGNOSIS — N3000 Acute cystitis without hematuria: Secondary | ICD-10-CM | POA: Diagnosis not present

## 2023-10-23 DIAGNOSIS — G8220 Paraplegia, unspecified: Secondary | ICD-10-CM | POA: Diagnosis present

## 2023-10-23 DIAGNOSIS — W458XXA Other foreign body or object entering through skin, initial encounter: Secondary | ICD-10-CM | POA: Diagnosis present

## 2023-10-23 DIAGNOSIS — Z5982 Transportation insecurity: Secondary | ICD-10-CM

## 2023-10-23 DIAGNOSIS — I10 Essential (primary) hypertension: Secondary | ICD-10-CM | POA: Diagnosis present

## 2023-10-23 DIAGNOSIS — Z5900 Homelessness unspecified: Secondary | ICD-10-CM

## 2023-10-23 DIAGNOSIS — L309 Dermatitis, unspecified: Secondary | ICD-10-CM | POA: Insufficient documentation

## 2023-10-23 DIAGNOSIS — Z9889 Other specified postprocedural states: Secondary | ICD-10-CM

## 2023-10-23 DIAGNOSIS — Q057 Lumbar spina bifida without hydrocephalus: Secondary | ICD-10-CM | POA: Diagnosis not present

## 2023-10-23 DIAGNOSIS — I493 Ventricular premature depolarization: Secondary | ICD-10-CM | POA: Diagnosis present

## 2023-10-23 DIAGNOSIS — R9389 Abnormal findings on diagnostic imaging of other specified body structures: Secondary | ICD-10-CM | POA: Diagnosis not present

## 2023-10-23 DIAGNOSIS — M8610 Other acute osteomyelitis, unspecified site: Secondary | ICD-10-CM

## 2023-10-23 DIAGNOSIS — Z933 Colostomy status: Secondary | ICD-10-CM

## 2023-10-23 DIAGNOSIS — L89154 Pressure ulcer of sacral region, stage 4: Secondary | ICD-10-CM | POA: Diagnosis present

## 2023-10-23 DIAGNOSIS — A419 Sepsis, unspecified organism: Secondary | ICD-10-CM | POA: Diagnosis not present

## 2023-10-23 DIAGNOSIS — M86452 Chronic osteomyelitis with draining sinus, left femur: Secondary | ICD-10-CM | POA: Diagnosis not present

## 2023-10-23 DIAGNOSIS — Z1152 Encounter for screening for COVID-19: Secondary | ICD-10-CM | POA: Diagnosis not present

## 2023-10-23 DIAGNOSIS — L89150 Pressure ulcer of sacral region, unstageable: Secondary | ICD-10-CM | POA: Diagnosis not present

## 2023-10-23 DIAGNOSIS — L89159 Pressure ulcer of sacral region, unspecified stage: Secondary | ICD-10-CM | POA: Diagnosis not present

## 2023-10-23 DIAGNOSIS — Z8249 Family history of ischemic heart disease and other diseases of the circulatory system: Secondary | ICD-10-CM | POA: Diagnosis not present

## 2023-10-23 DIAGNOSIS — L089 Local infection of the skin and subcutaneous tissue, unspecified: Secondary | ICD-10-CM | POA: Diagnosis not present

## 2023-10-23 DIAGNOSIS — M6281 Muscle weakness (generalized): Secondary | ICD-10-CM | POA: Diagnosis not present

## 2023-10-23 DIAGNOSIS — B952 Enterococcus as the cause of diseases classified elsewhere: Secondary | ICD-10-CM | POA: Diagnosis present

## 2023-10-23 DIAGNOSIS — M86652 Other chronic osteomyelitis, left thigh: Secondary | ICD-10-CM | POA: Diagnosis present

## 2023-10-23 DIAGNOSIS — Z87891 Personal history of nicotine dependence: Secondary | ICD-10-CM | POA: Diagnosis not present

## 2023-10-23 DIAGNOSIS — Z993 Dependence on wheelchair: Secondary | ICD-10-CM | POA: Diagnosis not present

## 2023-10-23 DIAGNOSIS — L89894 Pressure ulcer of other site, stage 4: Secondary | ICD-10-CM | POA: Diagnosis not present

## 2023-10-23 DIAGNOSIS — Z86718 Personal history of other venous thrombosis and embolism: Secondary | ICD-10-CM

## 2023-10-23 DIAGNOSIS — Z23 Encounter for immunization: Secondary | ICD-10-CM | POA: Diagnosis not present

## 2023-10-23 DIAGNOSIS — L89224 Pressure ulcer of left hip, stage 4: Secondary | ICD-10-CM | POA: Diagnosis present

## 2023-10-23 DIAGNOSIS — M8660 Other chronic osteomyelitis, unspecified site: Secondary | ICD-10-CM

## 2023-10-23 DIAGNOSIS — M4628 Osteomyelitis of vertebra, sacral and sacrococcygeal region: Secondary | ICD-10-CM | POA: Diagnosis present

## 2023-10-23 DIAGNOSIS — N390 Urinary tract infection, site not specified: Secondary | ICD-10-CM | POA: Diagnosis not present

## 2023-10-23 DIAGNOSIS — M8669 Other chronic osteomyelitis, multiple sites: Secondary | ICD-10-CM | POA: Diagnosis not present

## 2023-10-23 DIAGNOSIS — M009 Pyogenic arthritis, unspecified: Secondary | ICD-10-CM | POA: Diagnosis not present

## 2023-10-23 DIAGNOSIS — R5381 Other malaise: Secondary | ICD-10-CM | POA: Diagnosis present

## 2023-10-23 DIAGNOSIS — Z789 Other specified health status: Secondary | ICD-10-CM

## 2023-10-23 DIAGNOSIS — R509 Fever, unspecified: Secondary | ICD-10-CM | POA: Diagnosis not present

## 2023-10-23 DIAGNOSIS — S6991XA Unspecified injury of right wrist, hand and finger(s), initial encounter: Secondary | ICD-10-CM | POA: Insufficient documentation

## 2023-10-23 DIAGNOSIS — Z881 Allergy status to other antibiotic agents status: Secondary | ICD-10-CM | POA: Diagnosis not present

## 2023-10-23 DIAGNOSIS — E785 Hyperlipidemia, unspecified: Secondary | ICD-10-CM | POA: Diagnosis present

## 2023-10-23 DIAGNOSIS — R531 Weakness: Secondary | ICD-10-CM | POA: Diagnosis not present

## 2023-10-23 DIAGNOSIS — M869 Osteomyelitis, unspecified: Secondary | ICD-10-CM

## 2023-10-23 DIAGNOSIS — E43 Unspecified severe protein-calorie malnutrition: Secondary | ICD-10-CM | POA: Diagnosis not present

## 2023-10-23 DIAGNOSIS — K4090 Unilateral inguinal hernia, without obstruction or gangrene, not specified as recurrent: Secondary | ICD-10-CM | POA: Diagnosis not present

## 2023-10-23 DIAGNOSIS — E611 Iron deficiency: Secondary | ICD-10-CM | POA: Diagnosis present

## 2023-10-23 DIAGNOSIS — Q058 Sacral spina bifida without hydrocephalus: Secondary | ICD-10-CM | POA: Diagnosis not present

## 2023-10-23 LAB — CBC WITH DIFFERENTIAL/PLATELET
Abs Immature Granulocytes: 0.06 K/uL (ref 0.00–0.07)
Basophils Absolute: 0.1 K/uL (ref 0.0–0.1)
Basophils Relative: 1 %
Eosinophils Absolute: 0.4 K/uL (ref 0.0–0.5)
Eosinophils Relative: 3 %
HCT: 34 % — ABNORMAL LOW (ref 39.0–52.0)
Hemoglobin: 9.9 g/dL — ABNORMAL LOW (ref 13.0–17.0)
Immature Granulocytes: 1 %
Lymphocytes Relative: 11 %
Lymphs Abs: 1.4 K/uL (ref 0.7–4.0)
MCH: 21.9 pg — ABNORMAL LOW (ref 26.0–34.0)
MCHC: 29.1 g/dL — ABNORMAL LOW (ref 30.0–36.0)
MCV: 75.2 fL — ABNORMAL LOW (ref 80.0–100.0)
Monocytes Absolute: 1.5 K/uL — ABNORMAL HIGH (ref 0.1–1.0)
Monocytes Relative: 12 %
Neutro Abs: 9.4 K/uL — ABNORMAL HIGH (ref 1.7–7.7)
Neutrophils Relative %: 72 %
Platelets: 629 K/uL — ABNORMAL HIGH (ref 150–400)
RBC: 4.52 MIL/uL (ref 4.22–5.81)
RDW: 17.4 % — ABNORMAL HIGH (ref 11.5–15.5)
WBC: 12.9 K/uL — ABNORMAL HIGH (ref 4.0–10.5)
nRBC: 0 % (ref 0.0–0.2)

## 2023-10-23 LAB — COMPREHENSIVE METABOLIC PANEL WITH GFR
ALT: 11 U/L (ref 0–44)
AST: 13 U/L — ABNORMAL LOW (ref 15–41)
Albumin: 2.6 g/dL — ABNORMAL LOW (ref 3.5–5.0)
Alkaline Phosphatase: 95 U/L (ref 38–126)
Anion gap: 12 (ref 5–15)
BUN: 11 mg/dL (ref 6–20)
CO2: 24 mmol/L (ref 22–32)
Calcium: 8.6 mg/dL — ABNORMAL LOW (ref 8.9–10.3)
Chloride: 103 mmol/L (ref 98–111)
Creatinine, Ser: 0.81 mg/dL (ref 0.61–1.24)
GFR, Estimated: >60 mL/min (ref >60)
Glucose, Bld: 112 mg/dL — ABNORMAL HIGH (ref 70–99)
Potassium: 3.7 mmol/L (ref 3.5–5.1)
Sodium: 139 mmol/L (ref 135–145)
Total Bilirubin: 0.5 mg/dL (ref 0.0–1.2)
Total Protein: 7.6 g/dL (ref 6.5–8.1)

## 2023-10-23 LAB — URINALYSIS, W/ REFLEX TO CULTURE (INFECTION SUSPECTED)
Bilirubin Urine: NEGATIVE
Glucose, UA: NEGATIVE mg/dL
Ketones, ur: NEGATIVE mg/dL
Nitrite: POSITIVE — AB
Protein, ur: 100 mg/dL — AB
RBC / HPF: >50 RBC/hpf (ref 0–5)
Specific Gravity, Urine: 1.019 (ref 1.005–1.030)
WBC, UA: >50 WBC/hpf (ref 0–5)
pH: 7 (ref 5.0–8.0)

## 2023-10-23 LAB — RESP PANEL BY RT-PCR (RSV, FLU A&B, COVID)  RVPGX2
Influenza A by PCR: NEGATIVE
Influenza B by PCR: NEGATIVE
Resp Syncytial Virus by PCR: NEGATIVE
SARS Coronavirus 2 by RT PCR: NEGATIVE

## 2023-10-23 LAB — I-STAT CG4 LACTIC ACID, ED
Lactic Acid, Venous: 0.7 mmol/L (ref 0.5–1.9)
Lactic Acid, Venous: 2 mmol/L (ref 0.5–1.9)

## 2023-10-23 LAB — PROTIME-INR
INR: 1.1 (ref 0.8–1.2)
Prothrombin Time: 14.5 s (ref 11.4–15.2)

## 2023-10-23 MED ORDER — FERROUS SULFATE 325 (65 FE) MG PO TABS
325.0000 mg | ORAL_TABLET | Freq: Every day | ORAL | Status: DC
  Administered 2023-10-24 – 2023-11-02 (×10): 325 mg via ORAL
  Filled 2023-10-23 (×10): qty 1

## 2023-10-23 MED ORDER — LINEZOLID 600 MG/300ML IV SOLN
600.0000 mg | Freq: Once | INTRAVENOUS | Status: AC
  Administered 2023-10-23: 600 mg via INTRAVENOUS
  Filled 2023-10-23: qty 300

## 2023-10-23 MED ORDER — ACETAMINOPHEN 650 MG RE SUPP: 650.0000 mg | Freq: Four times a day (QID) | RECTAL | Status: DC | PRN

## 2023-10-23 MED ORDER — SODIUM CHLORIDE 0.9 % IV SOLN
2.0000 g | Freq: Once | INTRAVENOUS | Status: AC
  Administered 2023-10-23: 2 g via INTRAVENOUS

## 2023-10-23 MED ORDER — LACTATED RINGERS IV BOLUS (SEPSIS)
2000.0000 mL | Freq: Once | INTRAVENOUS | Status: AC
  Administered 2023-10-23: 2000 mL via INTRAVENOUS

## 2023-10-23 MED ORDER — LISINOPRIL-HYDROCHLOROTHIAZIDE 20-25 MG PO TABS: 1.0000 | ORAL_TABLET | Freq: Every day | ORAL | Status: DC

## 2023-10-23 MED ORDER — IOHEXOL 350 MG/ML SOLN
75.0000 mL | Freq: Once | INTRAVENOUS | Status: AC | PRN
  Administered 2023-10-23: 75 mL via INTRAVENOUS

## 2023-10-23 MED ORDER — VANCOMYCIN HCL IN DEXTROSE 1-5 GM/200ML-% IV SOLN: 1000.0000 mg | Freq: Once | INTRAVENOUS | Status: DC

## 2023-10-23 MED ORDER — ROSUVASTATIN CALCIUM 20 MG PO TABS
40.0000 mg | ORAL_TABLET | Freq: Every day | ORAL | Status: DC
  Administered 2023-10-24 – 2023-11-02 (×10): 40 mg via ORAL
  Filled 2023-10-23 (×10): qty 2

## 2023-10-23 MED ORDER — SODIUM CHLORIDE 0.9 % IV SOLN
2.0000 g | Freq: Three times a day (TID) | INTRAVENOUS | Status: DC
  Administered 2023-10-24 – 2023-11-02 (×28): 2 g via INTRAVENOUS
  Filled 2023-10-23 (×29): qty 12.5

## 2023-10-23 MED ORDER — FLECAINIDE ACETATE 100 MG PO TABS
100.0000 mg | ORAL_TABLET | Freq: Two times a day (BID) | ORAL | Status: DC
  Administered 2023-10-24 – 2023-11-02 (×20): 100 mg via ORAL
  Filled 2023-10-23 (×3): qty 1
  Filled 2023-10-23: qty 2
  Filled 2023-10-23 (×15): qty 1
  Filled 2023-10-23: qty 2
  Filled 2023-10-23: qty 1

## 2023-10-23 MED ORDER — METRONIDAZOLE 500 MG/100ML IV SOLN
500.0000 mg | Freq: Two times a day (BID) | INTRAVENOUS | Status: DC
  Administered 2023-10-24 – 2023-10-28 (×10): 500 mg via INTRAVENOUS
  Filled 2023-10-23 (×10): qty 100

## 2023-10-23 MED ORDER — ACETAMINOPHEN 325 MG PO TABS
650.0000 mg | ORAL_TABLET | Freq: Four times a day (QID) | ORAL | Status: DC | PRN
  Administered 2023-10-24 – 2023-10-26 (×2): 650 mg via ORAL
  Filled 2023-10-23 (×2): qty 2

## 2023-10-23 MED ORDER — METRONIDAZOLE 500 MG/100ML IV SOLN
500.0000 mg | Freq: Once | INTRAVENOUS | Status: AC
  Administered 2023-10-23: 500 mg via INTRAVENOUS
  Filled 2023-10-23: qty 100

## 2023-10-23 MED ORDER — METOPROLOL SUCCINATE ER 50 MG PO TB24
50.0000 mg | ORAL_TABLET | Freq: Every day | ORAL | Status: DC
  Administered 2023-10-24 – 2023-11-02 (×10): 50 mg via ORAL
  Filled 2023-10-23: qty 2
  Filled 2023-10-23 (×9): qty 1

## 2023-10-23 MED ORDER — LACTATED RINGERS IV SOLN: INTRAVENOUS | Status: DC

## 2023-10-23 MED ORDER — ENOXAPARIN SODIUM 40 MG/0.4ML IJ SOSY
40.0000 mg | PREFILLED_SYRINGE | Freq: Every day | INTRAMUSCULAR | Status: DC
  Administered 2023-10-24 – 2023-11-02 (×10): 40 mg via SUBCUTANEOUS
  Filled 2023-10-23 (×10): qty 0.4

## 2023-10-23 MED ORDER — LINEZOLID 600 MG/300ML IV SOLN
600.0000 mg | Freq: Two times a day (BID) | INTRAVENOUS | Status: DC
  Administered 2023-10-24 – 2023-10-28 (×10): 600 mg via INTRAVENOUS
  Filled 2023-10-23 (×11): qty 300

## 2023-10-23 NOTE — H&P (Addendum)
 Hospital Admission History and Physical Service Pager: [phone]  Patient name: Anthony CASTREJON Medical record number: [id number] Date of Birth: 10-19-1970 Age: 53 y.o. Gender: male  Primary Care Provider: Darren Jernigan, DO Consultants: Ortho, IR, ID to be placed 10/4 Code Status: Full code which was confirmed with family if patient unable to confirm   Preferred Emergency Contact:  Contact Information     Name Relation Home Work Mobile   [place] Friend   [phone]   [name] [phone]  [phone]      Other Contacts   None on File    Chief Complaint: Fever   Differential and Medical Decision Making:  Anthony [name] is a 53 y.o. male presenting with fever and found to be septic.   Differential for this patient's presentation of this includes osteomyelitis (highly likely given extensive history of chronic osteomyelitis with multiple debridements, sacral wounds with purulent discharge, and imaging findings), UTI (another likely source of infection, patient with indwelling foley catheter, UA consistent with infection), and bacteremia (possible given multiple sources for infection, blood cultures pending).  Assessment & Plan Sepsis (HCC) Febrile, tachycardic with a WBC of 12.9. Lactic acid 0.7 > 2.0. UA positive for infection. S/p 2 L LR, cefepime , linezolid , and metronidazole  in ED. CT A&P showed re-demonstrated extensive decubitus ulcers with chronic fragmentation and osseous destructive change involving bilateral hip joints, but overall less soft tissue thickening and gas within the joint spaces compared to prior. Small 3.7 collection at R posterior hip.  - Admit to FMTS, attending Dr. Rumball  - Continue cefepime  2 g IV Q8H, linezolid  600 mg IV Q12H, and metronidazole  500 mg IV Q12H (10/3 -) - Consider consult to orthopedic surgery for potential debridement  - Consider IR consult for abscess drainage - Consider ID consult for antibiotic regimen and duration   - Blood cultures pending - Telemetry  - Tylenol  650 mg Q6H PRN - Fall precautions - AM CBC and BMP  Chronic health problem Iron  deficiency - continue home ferrous sulfate 325 mg  PVCs - continue home Flecanide 100 mg BID HTN - continue home lisinopril -hydrochlorothiazide  20-25 mg and metoprolol  succinate 50 mg 24 hr  HLD - continue home rosuvastatin  40 mg daily   FEN/GI: Regular VTE Prophylaxis: Lovenox   Disposition: Progressive, Attending Dr. Madelon   History of Present Illness:  Anthony [name] is a 53 y.o. male presenting with fevers for 2 weeks.   Long history of infection. Lived with his mom after SNF on her couch which caused worsening of his bed sores. Patient has chronic osteomyelitis with femur head resection. Continues to pack his wounds.  He reports fevers for the last few weeks, temp max 101. He takes tylenol  regularly at home to manage his fevers. Patient's mother recently passed. Today her home, where  he had been living was sold. He also found out recently that he lost his apartment. He needs a safe disposition. He endorses some pain around the anterior part of his sore. Patient endorses chest pain and SOB. He has been under a lot of stress which he contributes to his chest pain and SOB. Does not have chest pain or SOB currently. He has a history of anxiety. Patient also states he feels his colostomy bag needs to be changed.   In the ED, patient was febrile and tachycardic with a WBC of 12.9 and code sepsis was initiated. Neutrophils 9.4. Hgb 9.9. Lactic acid 0.7 > 2.0. UA turbid with moderate leukocytes, positive nitrites,  100 protein, small hgb. UA micro showed many bacteria, mucus, >50 RBC and WBC. Blood cultures were obtained. CXR showed no acute cardiopulmonary abnormality. Cefepime  2g, linezolid  600 mg, and metronidazole  500 mg given. 2 L LR given and continuous IVF at 150 mL/hr started.   Review Of Systems: Per HPI   Pertinent Past Medical History: Anxiety Chronic  indwelling foley  Chronic sacral ulcer  Paraplegia 2/2 spina bifida Remainder reviewed in history tab.   Pertinent Past Surgical History: L hip debridement and removal of femoral head  Multiple I&Ds of hips/buttocks Laparoscopic loop colostomy  Remainder reviewed in history tab.   Pertinent Social History: Tobacco use: Former, quit over 20 years ago Alcohol use: No Other Substance use: denies Lived with mother but was recently displaced due to her passing   Pertinent Family History: Father - CAD, died from MI at 58 Maternal grandfather - CAD, died from MI at 36  Sister - sudden cardiac death 28  Important Outpatient Medications: Ferrous Sulfate 325 mg daily  Flecainide  100 mg BID Lisinopril -hydrochlorothiazide  20-25 mg  Metoprolol  succinate 50 mg 24 hr Nitroglycerin  0.4 mg PRN Rosuvastatin  40 mg daily  Senna 8.6 mg daily - as needed    Objective: BP 123/70   Pulse 84   Temp 99.3 F (37.4 C) (Oral)   Resp 12   SpO2 100%  Exam: General: NAD Eyes: EOM grossly intact  Cardiovascular: RRR, no m/r/g Respiratory: CTAB, normal work of breathing on room air  Gastrointestinal: soft, non-distended, non-tender to palpation, colostomy and suprapubic catheter present MSK: paraplegic  Neuro: no gross focal deficits  Psych: mood and affect appropriate   Labs:  CBC BMET  Recent Labs  Lab 10/23/23 2012  WBC 12.9*  HGB 9.9*  HCT 34.0*  PLT 629*   Recent Labs  Lab 10/23/23 2012  NA 139  K 3.7  CL 103  CO2 24  BUN 11  CREATININE 0.81  GLUCOSE 112*  CALCIUM  8.6*    Pertinent additional labs: Lactic acid 0.7>2.0   EKG: Pending.    Imaging Studies Performed:  Chest x-ray Impression from Radiologist: Elevation of the right hemidiaphragm. No acute cardiopulmonary abnormality.    My Interpretation: No focal consolidation, no cardiomegaly, no pleural effusions.    Anthony Lee, DO PGY-1, [facility]  FPTS Intern pager: [phone], text pages  welcome Secure chat group [facility] Teaching Service    I have reviewed the above and made the appropriate changes.   Anthony Pinal, DO [facility], PGY-3 10/24/23 12:28 AM

## 2023-10-23 NOTE — Assessment & Plan Note (Addendum)
 Febrile, tachycardic with a WBC of 12.9. Lactic acid 0.7 > 2.0. UA positive for infection. S/p 2 L LR, cefepime , linezolid , and metronidazole  in ED. CT A&P showed re-demonstrated extensive decubitus ulcers with chronic fragmentation and osseous destructive change involving bilateral hip joints, but overall less soft tissue thickening and gas within the joint spaces compared to prior. Small 3.7 collection at R posterior hip.  - Admit to FMTS, attending Dr. Rumball  - Continue cefepime  2 g IV Q8H, linezolid  600 mg IV Q12H, and metronidazole  500 mg IV Q12H (10/3 -) - Consider consult to orthopedic surgery for potential debridement  - Consider IR consult for abscess drainage - Consider ID consult for antibiotic regimen and duration  - Blood cultures pending - Telemetry  - Tylenol  650 mg Q6H PRN - Fall precautions - AM CBC and BMP

## 2023-10-23 NOTE — ED Triage Notes (Signed)
 Patient states he has wounds he needs to get checked on his side and back and fever of 100F, patient is wheelchair bound. Also reports his mother died and her estate was sold today so he now has nowhere to live.

## 2023-10-23 NOTE — H&P (Incomplete)
 Hospital Admission History and Physical Service Pager: [phone]  Patient name: Anthony [name] Medical record number: [id number] Date of Birth: 01-27-1970 Age: 53 y.o. Gender: male  Primary Care Provider: Darren Jernigan, DO Consultants: Ortho Code Status: Full code which was confirmed with family if patient unable to confirm   Preferred Emergency Contact:  Contact Information     Name Relation Home Work Mobile   Rankin Friend   [phone]   [name] [phone]  [phone]      Other Contacts   None on File    Chief Complaint: Fever   Differential and Medical Decision Making:  Anthony [name] is a 53 y.o. male presenting with wound check and fever and found to be septic.   Differential for this patient's presentation of this includes osteomyelitis (highly likely given extensive history, CT A&P pending), UTI (another likely source of infection, patient with indwelling foley catheter, UA consistent with infection), and sacral ulcer (infection possibly contained to soft tissue, CT A&P to further evaluate, photos in chart).  Assessment & Plan Sepsis (HCC) Febrile, tachycardic with a WBC of 12.9. Lactic acid 0.7 > 2.0. UA positive for infection. S/p 2 L LR, cefepime , linezolid , and metronidazole  in ED. CT A&P showed redemonstrated extensive decubitus ulcers with chronic fragmentation and osseous destructive change involving bilateral hip joints, but overall less soft tissue thickening and gas within the joint spaces compared to prior.  - Admit to FMTS, attending Dr. Madelon  - Continue cefepime , linezolid , and metronidazole  (10/3) - Continuous LR 150 mL/hr - Fall precautions - AM CBC and BMP    Chronic and Stable Conditions: ***  FEN/GI: *** VTE Prophylaxis: ***  Disposition: Progressive, Attending Dr. Madelon   History of Present Illness:  Anthony [name] is a 53 y.o. male presenting with ***  Long history of infection. Lived with his mom after SNF on  her couch which caused worsening of his bed sores.   Chronic osteomyelitis with femur head resection. Continues to pack his wounds.  Fever for the last few weeks, temp max 101 Takes tylenol  regularly for fever Just got kicked out his mom's house d/t her passing  Lost apartment  Needs safe disposition  Has some pain around sores towards the anterior part of his sores  Has been under a lot of stress, contributes that to chest pain and SOB. Does not have any currently.  History of anxiety   In the ED, patient was febrile and tachycardic with a WBC of 12.9 and code sepsis was initiated. Neutrophils 9.4. Hgb 9.9. Lactic acid 0.7 > 2.0. UA turbid with moderate leukocytes, positive nitrites, 100 protein, small hgb. UA micro showed many bacteria, mucus, >50 RBC and WBC. Blood cultures were obtained. CXR showed no acute cardiopulmonary abnormality. Cefepime  2g, linezolid  600 mg, and metronidazole  500 mg given. 2 L LR given and continuous IVF at 150 mL/hr started.   Review Of Systems: Per HPI   Pertinent Past Medical History: Anxiety Chronic indwelling foley  Chronic sacral ulcer  Paraplegia 2/2 spina bifida Remainder reviewed in history tab.   Pertinent Past Surgical History: L hip debridement and removal of femoral head  Multiple I&Ds of hips/buttocks Laparoscopic loop colostomy  Remainder reviewed in history tab.   Pertinent Social History: Tobacco use: Former, quit over 20 years ago Alcohol use: No Other Substance use: denies Lived with mother but was recently displaced due to her passing   Pertinent Family History: Father - CAD, died from MI at 23  Maternal grandfather - CAD, died from MI at 56  Sister - sudden cardiac death 65  Important Outpatient Medications: Ferrous Sulfate 325 mg daily  Flecainide  100 mg BID Lisinopril -hydrochlorothiazide  20-25 mg  Metoprolol  succinate 50 mg 24 hr Nitroglycerin  0.4 mg PRN Rosuvastatin  40 mg daily  Senna 8.6 mg daily - as needed     Objective: BP 123/70   Pulse 84   Temp 99.3 F (37.4 C) (Oral)   Resp 12   SpO2 100%  Exam: General: *** Eyes: *** ENTM: *** Neck: *** Cardiovascular: *** Respiratory: *** Gastrointestinal: *** MSK: *** Derm: *** Neuro: *** Psych: ***  Labs:  CBC BMET  Recent Labs  Lab 10/23/23 2012  WBC 12.9*  HGB 9.9*  HCT 34.0*  PLT 629*   Recent Labs  Lab 10/23/23 2012  NA 139  K 3.7  CL 103  CO2 24  BUN 11  CREATININE 0.81  GLUCOSE 112*  CALCIUM  8.6*    Pertinent additional labs ***.  EKG: Pending.    Imaging Studies Performed:  Chest x-ray Impression from Radiologist: Elevation of the right hemidiaphragm. No acute cardiopulmonary abnormality.    My Interpretation: No focal consolidation, no cardiomegaly, no pleural effusions.    Cleotilde Perkins, DO 10/23/2023, 11:36 PM PGY-1, [facility]  FPTS Intern pager: [phone], text pages welcome Secure chat group [facility] Teaching Service

## 2023-10-23 NOTE — ED Provider Notes (Signed)
 [place] EMERGENCY DEPARTMENT AT [facility] Provider Note   CSN: [id number] Arrival date & time: 10/23/23  1906    Patient presents with: Wound Check and Fever   Anthony [name] is a 53 y.o. male history of spina bifida, recurrent decubitus ulcer, septic arthritis of bilateral hips, chronic osteomyelitis, DVT here for evaluation of wounds to his sacral region.  Patient states he has chronic wounds.  He states over the last 2 weeks he was running intermittent fevers for the last few days he is running persistent fevers at home.  He noted his chronic wounds have started draining purulent drainage.  He has been trying to pack them himself.  He states he was living with his mother who was taking care of him however she passed away he subsequently was staying at her house by himself trying to take care of himself.  He was not able to come in sooner due to social issues.  He denies any pain.  No nausea, vomiting, shortness of breath.  Has diverting ostomy suprapubic cath   HPI     Prior to Admission medications   Medication Sig Start Date End Date Taking? Authorizing Provider  acetaminophen  (TYLENOL ) 500 MG tablet Take 1,000 mg by mouth every 6 (six) hours as needed for moderate pain, headache or fever.    [provider]  apixaban  (ELIQUIS ) 5 MG TABS tablet Take 1 tablet (5 mg total) by mouth 2 (two) times daily. 09/01/22   Nicholas Bar, MD  ascorbic acid  (VITAMIN C ) 500 MG tablet Take 1 tablet (500 mg total) by mouth 2 (two) times daily. 09/01/22   Nicholas Bar, MD  Catheters (FOLEY CATHETER 2-WAY) MISC Use as directed by urologist. 12/30/12   Curtis Hadassah DASEN, MD  ciprofloxacin  (CIPRO ) 500 MG tablet Take 1 tablet (500 mg total) by mouth 2 (two) times daily. 06/11/23   Harden Jerona GAILS, MD  feeding supplement (ENSURE ENLIVE / ENSURE PLUS) LIQD Take 237 mLs by mouth 3 (three) times daily between meals. 09/01/22   Nicholas Bar, MD  Ferrous Sulfate (IRON ) 325 (65 Fe) MG  TABS Take 1 tablet by mouth daily. 04/07/23   [provider]  flecainide  (TAMBOCOR ) 100 MG tablet Take 1 tablet (100 mg total) by mouth 2 (two) times daily. 12/31/22   Aniceto Daphne CROME, NP  gabapentin  (NEURONTIN ) 100 MG capsule Take 1 capsule (100 mg total) by mouth 3 (three) times daily. 09/01/22   Nicholas Bar, MD  hydrocerin (EUCERIN) CREA Apply 1 Application topically 2 (two) times daily. 09/01/22   Nicholas Bar, MD  ibuprofen  (ADVIL ) 200 MG tablet Take 200 mg by mouth every 6 (six) hours as needed for headache or moderate pain.    [provider]  lactulose  (CHRONULAC ) 10 GM/15ML solution Take 15 mLs (10 g total) by mouth 2 (two) times daily. 09/01/22   Nicholas Bar, MD  lisinopril -hydrochlorothiazide  (ZESTORETIC ) 20-25 MG tablet Take 1 tablet by mouth daily. 04/07/23   Shlomo Wilbert SAUNDERS, MD  loratadine  (CLARITIN ) 10 MG tablet Take 1 tablet (10 mg total) by mouth daily. 09/02/22   Nicholas Bar, MD  methocarbamol  1000 MG TABS Take 1,000 mg by mouth every 8 (eight) hours. 09/01/22   Nicholas Bar, MD  metoprolol  succinate (TOPROL -XL) 50 MG 24 hr tablet Take with or immediately following a meal. 08/03/23   Turner, Wilbert SAUNDERS, MD  Multiple Vitamin (MULTIVITAMIN WITH MINERALS) TABS tablet Take 1 tablet by mouth daily.    [provider]  nitroGLYCERIN  (NITROSTAT )  0.4 MG SL tablet Place 1 tablet (0.4 mg total) under the tongue every 5 (five) minutes as needed for chest pain. 01/10/22   Lucien Orren SAILOR, PA-C  Omega-3 Fatty Acids (FISH OIL PO) Take 1 capsule by mouth daily.    [provider]  Potassium 99 MG TABS Take 1 tablet by mouth 2 (two) times daily. 04/07/23   [provider]  psyllium (HYDROCIL/METAMUCIL) 95 % PACK Take 1 packet by mouth daily. 09/02/22   Nicholas Bar, MD  rosuvastatin  (CRESTOR ) 40 MG tablet TAKE 1 TABLET BY MOUTH ONCE DAILY . APPOINTMENT REQUIRED FOR FUTURE REFILLS 07/15/23   Shlomo Wilbert SAUNDERS, MD  senna (SENOKOT) 8.6 MG TABS tablet Take 1  tablet (8.6 mg total) by mouth daily. 09/02/22   Nicholas Bar, MD  simethicone  (MYLICON) 80 MG chewable tablet Chew 1 tablet (80 mg total) by mouth every 6 (six) hours as needed for flatulence. 09/01/22   Nicholas Bar, MD  Sodium Hypochlorite (DAKINS, FULL STRENGTH,) 0.5 % SOLN Apply 1 Application topically See admin instructions. Apply to right hip topically every day and evening shift for wound.    [provider]  zinc  sulfate 220 (50 Zn) MG capsule Take 1 capsule (220 mg total) by mouth daily. 09/02/22   Nicholas Bar, MD    Allergies: Firvanq [vancomycin]    Review of Systems  Constitutional:  Positive for activity change and fever.  HENT: Negative.    Respiratory: Negative.    Cardiovascular: Negative.   Gastrointestinal: Negative.   Genitourinary: Negative.   Skin:  Positive for wound.  Neurological: Negative.   All other systems reviewed and are negative.   Updated Vital Signs BP 123/70   Pulse 84   Temp 99.3 F (37.4 C) (Oral)   Resp 12   SpO2 100%   Physical Exam Vitals and nursing note reviewed.  Constitutional:      General: He is not in acute distress.    Appearance: He is well-developed. He is ill-appearing. He is not toxic-appearing or diaphoretic.  HENT:     Head: Atraumatic.     Nose: Nose normal.     Mouth/Throat:     Mouth: Mucous membranes are moist.  Eyes:     Pupils: Pupils are equal, round, and reactive to light.  Cardiovascular:     Rate and Rhythm: Normal rate and regular rhythm.     Pulses: Normal pulses.     Heart sounds: Normal heart sounds.  Pulmonary:     Effort: Pulmonary effort is normal. No respiratory distress.     Breath sounds: Normal breath sounds.  Abdominal:     General: Bowel sounds are normal. There is no distension.     Palpations: Abdomen is soft.     Comments: Diverting ostomy, light Crite-green stool.  Suprapubic cath without surrounding erythema, drainage  Musculoskeletal:     Cervical back: Normal range of  motion and neck supple.     Comments: Flaccid lower extremities, pressure ulcers.   Skin:    General: Skin is warm and dry.     Comments: See picture in chart.  Deep sacral, bilateral hip wounds, packing to pelvis removed with purulent drainage  Neurological:     Mental Status: He is alert and oriented to person, place, and time.          (all labs ordered are listed, but only abnormal results are displayed) Labs Reviewed  COMPREHENSIVE METABOLIC PANEL WITH GFR - Abnormal; Notable for the following components:  Result Value   Glucose, Bld 112 (*)    Calcium  8.6 (*)    Albumin  2.6 (*)    AST 13 (*)    All other components within normal limits  CBC WITH DIFFERENTIAL/PLATELET - Abnormal; Notable for the following components:   WBC 12.9 (*)    Hemoglobin 9.9 (*)    HCT 34.0 (*)    MCV 75.2 (*)    MCH 21.9 (*)    MCHC 29.1 (*)    RDW 17.4 (*)    Platelets 629 (*)    Neutro Abs 9.4 (*)    Monocytes Absolute 1.5 (*)    All other components within normal limits  URINALYSIS, W/ REFLEX TO CULTURE (INFECTION SUSPECTED) - Abnormal; Notable for the following components:   APPearance TURBID (*)    Hgb urine dipstick SMALL (*)    Protein, ur 100 (*)    Nitrite POSITIVE (*)    Leukocytes,Ua MODERATE (*)    Bacteria, UA MANY (*)    All other components within normal limits  I-STAT CG4 LACTIC ACID, ED - Abnormal; Notable for the following components:   Lactic Acid, Venous 2.0 (*)    All other components within normal limits  RESP PANEL BY RT-PCR (RSV, FLU A&B, COVID)  RVPGX2  CULTURE, BLOOD (ROUTINE X 2)  CULTURE, BLOOD (ROUTINE X 2)  PROTIME-INR  I-STAT CG4 LACTIC ACID, ED    EKG: None  Radiology: CT ABDOMEN PELVIS W CONTRAST Result Date: 10/23/2023 CLINICAL DATA:  Sepsis EXAM: CT ABDOMEN AND PELVIS WITH CONTRAST TECHNIQUE: Multidetector CT imaging of the abdomen and pelvis was performed using the standard protocol following bolus administration of intravenous contrast.  RADIATION DOSE REDUCTION: This exam was performed according to the departmental dose-optimization program which includes automated exposure control, adjustment of the mA and/or kV according to patient size and/or use of iterative reconstruction technique. CONTRAST:  75mL OMNIPAQUE  IOHEXOL  350 MG/ML SOLN COMPARISON:  CT 09/06/2022, 02/24/2023, MRI 09/07/2022, CT 07/25/2022, 09/09/2021 FINDINGS: Lower chest: Lung bases demonstrate no acute airspace disease. Hepatobiliary: No calcified gallstone. Interval 11 mm hypodensity in the posterior right hepatic lobe, series 3 image 29, indeterminate in density. No biliary dilatation Pancreas: Unremarkable. No pancreatic ductal dilatation or surrounding inflammatory changes. Spleen: Enlarged measuring 15 cm AP. Adrenals/Urinary Tract: Adrenal glands are normal. Prominent renal pelvises as before. No hydroureter. Suprapubic catheter in the bladder. At least 3 large bladder stones measuring up to 4.3 cm. Stomach/Bowel: The stomach is nonenlarged. There is no dilated small bowel. Abnormal duodenal jejunal junction which does not cross midline and consistent with malrotation spectrum. There is no evidence for a bowel obstruction. Left abdominal colostomy with small parastomal hernia containing fat and small bowel but no obstruction. Negative appendix Vascular/Lymphatic: Mild aortic atherosclerosis. No aneurysm. Mildly prominent retroperitoneal nodes with 12 mm node at the aortic bifurcation. Chronic enlargement of iliac nodes, for example right external iliac node measuring 17 mm. Reproductive: Negative prostate except for calcification Other: No ascites or free air moderate fat containing right inguinal hernia Musculoskeletal: Chronic soft tissue mass with calcification at the midline posterior paraspinal soft tissues. Diffuse paraspinal muscular atrophy. Redemonstrated large/deep decubitus ulceration. Skin breakdown and soft tissue loss over the ischium, hips, sacrococcygeal  spine and perineum with ulceration is again extending down to the base of the penis. Sclerosis and erosive changes of the ischium, grossly similar. Chronic erosions, resorption and osseous destructive change of the bilateral acetabula and proximal femurs. Ulceration and gas extending into the bilateral hip joints as  before but less gas in the joint space compared to prior. Slight progressive resorption of multiple osseous fragments involving proximal right femur compared to prior. Small rim enhancing fluid collection at the right posterior hip measuring 3.7 x 1.9 cm on series 3, image 84. Grossly similar sacrococcygeal mild erosive change. Overall less gas and soft tissue change at the bilateral hip joints compared to the prior pelvic CT. IMPRESSION: 1. No CT evidence for acute intra-abdominal or pelvic abnormality. 2. Redemonstrated extensive decubitus ulcers with chronic fragmentation and osseous destructive change involving the bilateral hip joints. Some progressive resorption of right proximal femoral bone fragments compared to the prior pelvic CT but in general, overall less soft tissue thickening and gas within the joint spaces compared to the prior CT. Small 3.7 cm rim enhancing collection at the posterior right hip may reflect small inflammatory collection or abscess. Previously described sclerosis and osseous erosive change in deformity of the pubic bones, ischium, and sacrococcygeal region are otherwise grossly unchanged. 3. Interval 11 mm hypodensity in the posterior right hepatic lobe, indeterminate in density. Nonemergent MRI evaluation may be obtained if felt clinically appropriate. 4. Splenomegaly. 5. Left abdominal colostomy with small parastomal hernia containing fat and small bowel but no obstruction. 6. Suprapubic catheter in the bladder. At least 3 large bladder stones measuring up to 4.3 cm. Aortic Atherosclerosis (ICD10-I70.0). Electronically Signed   By: Luke Bun M.D.   On: 10/23/2023  23:33   DG Chest 2 View Result Date: 10/23/2023 CLINICAL DATA:  [id number] Fever [id number] EXAM: CHEST - 2 VIEW COMPARISON:  03/01/2023 FINDINGS: Elevation of the right hemidiaphragm. No focal airspace consolidation, pleural effusion, or pneumothorax. No cardiomegaly.No acute fracture or destructive lesion. Multilevel thoracic osteophytosis. IMPRESSION: No acute cardiopulmonary abnormality. Electronically Signed   By: Rogelia Myers M.D.   On: 10/23/2023 20:30     .Critical Care  Performed by: Edie Rosebud LABOR, PA-C Authorized by: Edie Rosebud LABOR, PA-C   Critical care provider statement:    Critical care time (minutes):  30   Critical care was necessary to treat or prevent imminent or life-threatening deterioration of the following conditions:  Sepsis   Critical care was time spent personally by me on the following activities:  Development of treatment plan with patient or surrogate, discussions with consultants, evaluation of patient's response to treatment, examination of patient, ordering and review of laboratory studies, ordering and review of radiographic studies, ordering and performing treatments and interventions, pulse oximetry, re-evaluation of patient's condition and review of old charts    Medications Ordered in the ED  lactated ringers  infusion (has no administration in time range)  ceFEPIme  (MAXIPIME ) 2 g in sodium chloride  0.9 % 100 mL IVPB (2 g Intravenous New Bag/Given 10/23/23 2343)  metroNIDAZOLE  (FLAGYL ) IVPB 500 mg (500 mg Intravenous New Bag/Given 10/23/23 2345)  lactated ringers  bolus 2,000 mL (0 mLs Intravenous Stopped 10/23/23 2341)  linezolid  (ZYVOX ) IVPB 600 mg (0 mg Intravenous Stopped 10/23/23 2341)  iohexol  (OMNIPAQUE ) 350 MG/ML injection 75 mL (75 mLs Intravenous Contrast Given 10/23/23 3827)   53 year old multiple medical comorbidities here for evaluation of fever and worsening sacral wounds.  History of paraplegia s/p spina bifida, prior septic arthritis bilateral  hips, osteomyelitis here for evaluation of fever and worsening drainage from wounds.  Here he his febrile, tachycardic.  Appears to have purulent drainage from the packing that he has been doing to his sacral wounds.  Has extensive skin breakdown to sacral region.  Patient given sepsis antibiotics.  Apparent allergy  to vancomycin per pharmacist.  Will give linezolid   Labs and imaging personally viewed and interpreted:  CBC leukocytosis 12.9 Metabolic panel without significant abnormality Lactic 2.0 UA positive for infection--- did come from suprapubic cath COVID, flu, RSV negative Chest x-ray without significant abnormality CT abdomen pelvis fluid collection around right posterior hip will abscess CT L-spine pending on admission   Patient reassessed.  Discussed labs and imaging.  Given IV antibiotics per sepsis protocol.  Patient reassessed.  Agreeable with admission.  Discussed with Dr. Cleotilde with family practice who will see patient for admission.  The patient appears reasonably stabilized for admission considering the current resources, flow, and capabilities available in the ED at this time, and I doubt any other [facility] requiring further screening and/or treatment in the ED prior to admission.                                     Medical Decision Making Amount and/or Complexity of Data Reviewed External Data Reviewed: labs, radiology and notes. Labs: ordered. Decision-making details documented in ED Course. Radiology: ordered and independent interpretation performed. Decision-making details documented in ED Course.  Risk OTC drugs. Prescription drug management. Decision regarding hospitalization. Diagnosis or treatment significantly limited by social determinants of health.      Final diagnoses:  Sepsis without acute organ dysfunction, due to unspecified organism (HCC)  Wound of sacral region, initial encounter  Acute cystitis without hematuria  History of suprapubic  catheter  Spina bifida, unspecified hydrocephalus presence, unspecified spinal region [facility])    ED Discharge Orders     None          Hisao Doo A, PA-C 10/23/23 2349    Lenor Hollering, MD 10/25/23 1402

## 2023-10-23 NOTE — Sepsis Progress Note (Signed)
 Elink monitoring for the code sepsis protocol.

## 2023-10-23 NOTE — ED Notes (Signed)
 Patient transported to CT

## 2023-10-23 NOTE — ED Notes (Signed)
 See media in chart for extensive wounds on bottom. Pt arrived with brief that was disintegrated into wounds. Pt reporting he has been packing his wounds 2-3x daily. Pt has colostomy and suprapubic catheter. Wounds unpacked with green discharge on guaze, odor present. Patient cleansed with soap and water, mepelex applied and new chux on bed. Leg bag was attached to patient with a plastic grocery bag, new foley bag attached.

## 2023-10-23 NOTE — ED Triage Notes (Signed)
 W/c bound pt , reports he has several pressure wounds on his buttocks area he wants evaluated. He says he has been having fevers around 100 for about the last week or 2. Last tylenol  about an hour ago.

## 2023-10-24 DIAGNOSIS — Z9889 Other specified postprocedural states: Secondary | ICD-10-CM | POA: Diagnosis not present

## 2023-10-24 DIAGNOSIS — A419 Sepsis, unspecified organism: Secondary | ICD-10-CM | POA: Diagnosis not present

## 2023-10-24 DIAGNOSIS — L89150 Pressure ulcer of sacral region, unstageable: Secondary | ICD-10-CM | POA: Insufficient documentation

## 2023-10-24 DIAGNOSIS — S6991XA Unspecified injury of right wrist, hand and finger(s), initial encounter: Secondary | ICD-10-CM | POA: Insufficient documentation

## 2023-10-24 DIAGNOSIS — Z789 Other specified health status: Secondary | ICD-10-CM

## 2023-10-24 DIAGNOSIS — Q059 Spina bifida, unspecified: Secondary | ICD-10-CM | POA: Diagnosis not present

## 2023-10-24 LAB — BASIC METABOLIC PANEL WITH GFR
Anion gap: 13 (ref 5–15)
BUN: 8 mg/dL (ref 6–20)
CO2: 23 mmol/L (ref 22–32)
Calcium: 8.3 mg/dL — ABNORMAL LOW (ref 8.9–10.3)
Chloride: 102 mmol/L (ref 98–111)
Creatinine, Ser: 0.64 mg/dL (ref 0.61–1.24)
GFR, Estimated: >60 mL/min (ref >60)
Glucose, Bld: 102 mg/dL — ABNORMAL HIGH (ref 70–99)
Potassium: 3.2 mmol/L — ABNORMAL LOW (ref 3.5–5.1)
Sodium: 138 mmol/L (ref 135–145)

## 2023-10-24 LAB — MRSA NEXT GEN BY PCR, NASAL: MRSA by PCR Next Gen: NOT DETECTED

## 2023-10-24 LAB — CBC
HCT: 29.1 % — ABNORMAL LOW (ref 39.0–52.0)
Hemoglobin: 8.5 g/dL — ABNORMAL LOW (ref 13.0–17.0)
MCH: 22.4 pg — ABNORMAL LOW (ref 26.0–34.0)
MCHC: 29.2 g/dL — ABNORMAL LOW (ref 30.0–36.0)
MCV: 76.8 fL — ABNORMAL LOW (ref 80.0–100.0)
Platelets: 474 K/uL — ABNORMAL HIGH (ref 150–400)
RBC: 3.79 MIL/uL — ABNORMAL LOW (ref 4.22–5.81)
RDW: 17.7 % — ABNORMAL HIGH (ref 11.5–15.5)
WBC: 8.5 K/uL (ref 4.0–10.5)
nRBC: 0 % (ref 0.0–0.2)

## 2023-10-24 LAB — LACTIC ACID, PLASMA: Lactic Acid, Venous: 1.4 mmol/L (ref 0.5–1.9)

## 2023-10-24 LAB — HIV ANTIBODY (ROUTINE TESTING W REFLEX): HIV Screen 4th Generation wRfx: NONREACTIVE

## 2023-10-24 MED ORDER — COLLAGENASE 250 UNIT/GM EX OINT
TOPICAL_OINTMENT | Freq: Every day | CUTANEOUS | Status: DC
  Filled 2023-10-24 (×2): qty 30

## 2023-10-24 MED ORDER — POTASSIUM CHLORIDE CRYS ER 20 MEQ PO TBCR
40.0000 meq | EXTENDED_RELEASE_TABLET | Freq: Once | ORAL | Status: AC
  Administered 2023-10-24: 40 meq via ORAL
  Filled 2023-10-24: qty 2

## 2023-10-24 MED ORDER — ZINC OXIDE 40 % EX OINT
TOPICAL_OINTMENT | Freq: Two times a day (BID) | CUTANEOUS | Status: DC
  Administered 2023-10-25 – 2023-10-30 (×3): 1 via TOPICAL
  Filled 2023-10-24 (×2): qty 57

## 2023-10-24 MED ORDER — HYDROCHLOROTHIAZIDE 25 MG PO TABS
25.0000 mg | ORAL_TABLET | Freq: Every day | ORAL | Status: DC
  Administered 2023-10-24 – 2023-11-02 (×10): 25 mg via ORAL
  Filled 2023-10-24 (×10): qty 1

## 2023-10-24 MED ORDER — TETANUS-DIPHTH-ACELL PERTUSSIS 5-2-15.5 LF-MCG/0.5 IM SUSP
0.5000 mL | Freq: Once | INTRAMUSCULAR | Status: AC
  Administered 2023-10-24: 0.5 mL via INTRAMUSCULAR
  Filled 2023-10-24: qty 0.5

## 2023-10-24 MED ORDER — LISINOPRIL 20 MG PO TABS
20.0000 mg | ORAL_TABLET | Freq: Every day | ORAL | Status: DC
  Administered 2023-10-24 – 2023-11-02 (×10): 20 mg via ORAL
  Filled 2023-10-24 (×10): qty 1

## 2023-10-24 NOTE — Assessment & Plan Note (Signed)
 Incidentally injured with what sounds like pruning shears while cutting up things at home. -Tdap shot

## 2023-10-24 NOTE — Assessment & Plan Note (Addendum)
 Per chart review prior pressure ulcer sample with E faecalis resistant to both ampicillin  and vancomycin, will continue on broad-spectrum antibiotics and narrow as able based on blood/wound culture. 3.7 cm collection at the posterior right hip, not definitively abscess formation therefore we will hold on IR intervention for possible drainage.  Follows with Ortho care outpatient, Dr. Harden, consulted for opinion on wound culture/debridement.  Vital signs stable, afebrile overnight. Taking PO, will d/c fluids - Ortho consult, appreciate their care - Continue cefepime  2 g IV Q8H, linezolid  600 mg IV Q12H, and metronidazole  500 mg IV Q12H (10/3 -) - Consider IR consult for abscess drainage if not improving - Consider ID consult pending blood/possible wound culture results - Blood cultures pending - D/c MIVF - Tylenol  650 mg Q6H PRN - Fall precautions - AM CBC and BMP

## 2023-10-24 NOTE — Progress Notes (Signed)
 Daily Progress Note Intern Pager: [phone]  Patient name: Anthony [name] Medical record number: [id number] Date of birth: [birth year] Age: 53 y.o. Gender: male  Primary Care Provider: Jerrie Gathers, DO Consultants: None Code Status: Full code  Pt Overview and Major Events to Date:  10/3: Admitted to FMTS  Assessment and Plan: Anthony [name] is a 53 year old male with past medical history paraplegia secondary to spina bifida, chronic sacral ulceration, suprapubic catheter, HTN and HLD presenting with sepsis likely secondary to extensive pressure injuries. Assessment & Plan Sepsis (HCC) Unstageable pressure ulcer of sacral region [facility]) Per chart review prior pressure ulcer sample with E faecalis resistant to both ampicillin  and vancomycin, will continue on broad-spectrum antibiotics and narrow as able based on blood/wound culture. 3.7 cm collection at the posterior right hip, not definitively abscess formation therefore we will hold on IR intervention for possible drainage.  Follows with Ortho care outpatient, Dr. Harden, consulted for opinion on wound culture/debridement.  Vital signs stable, afebrile overnight. Taking PO, will d/c fluids - Ortho consult, appreciate their care - Continue cefepime  2 g IV Q8H, linezolid  600 mg IV Q12H, and metronidazole  500 mg IV Q12H (10/3 -) - Consider IR consult for abscess drainage if not improving - Consider ID consult pending blood/possible wound culture results - Blood cultures pending - D/c MIVF - Tylenol  650 mg Q6H PRN - Fall precautions - AM CBC and BMP  Injury of right thumb Incidentally injured with what sounds like pruning shears while cutting up things at home. -Tdap shot Chronic health problem Iron  deficiency - continue home ferrous sulfate 325 mg  PVCs - continue home Flecanide 100 mg BID HTN - continue home lisinopril -hydrochlorothiazide  20-25 mg and metoprolol  succinate 50 mg 24 hr  HLD - continue home rosuvastatin  40 mg daily   Suprapubic catheter - clean/dry catheter site with normal UOP Colostomy - clean/dry colostomy site with normal stool output  FEN/GI: Regular PPx: Lovenox  Dispo: Pending clinical improvement  Subjective:  Assessed at bedside, denies any pain or discomfort.  States he has been treating all of his wounds at home including changing the bandages.  Otherwise feels well, eating and drinking normally.  Colostomy bag in place, denies bloody stool.  Suprapubic catheter in place, draining appropriately with yellow urine.  Objective: Temp:  [99.3 F (37.4 C)-100.2 F (37.9 C)] 99.6 F (37.6 C) (10/04 0240) Pulse Rate:  [76-107] 85 (10/04 0715) Resp:  [12-25] 14 (10/04 0715) BP: (120-135)/(70-78) 128/76 (10/04 0715) SpO2:  [92 %-100 %] 98 % (10/04 0715) Physical Exam: General: Laying on his right side, NAD Cardiovascular: RRR without murmur Respiratory: CTAB.  Normal work of breathing on room air Abdomen: Soft, nontender, nondistended.  Colostomy bag in place with no surrounding erythema or drainage and liquidy [place] stool.  Suprapubic catheter in place without surrounding erythema or drainage at catheter site. Skin: Extensive pressure injuries across left hip, low back, sacral and right hip with various stages of closure.  No purulent drainage or erythema surrounding the sites.  Laboratory: Most recent CBC Lab Results  Component Value Date   WBC 8.5 10/24/2023   HGB 8.5 (L) 10/24/2023   HCT 29.1 (L) 10/24/2023   MCV 76.8 (L) 10/24/2023   PLT 474 (H) 10/24/2023   Most recent BMP    Latest Ref Rng & Units 10/24/2023    4:43 AM  BMP  Glucose 70 - 99 mg/dL 897   BUN 6 - 20 mg/dL 8   Creatinine 9.38 - 8.75 mg/dL  0.64   Sodium 135 - 145 mmol/L 138   Potassium 3.5 - 5.1 mmol/L 3.2   Chloride 98 - 111 mmol/L 102   CO2 22 - 32 mmol/L 23   Calcium  8.9 - 10.3 mg/dL 8.3     Other pertinent labs: Lactate: 2.0 > 1.4 Quad 3: Negative Blood cultures: Pending   Imaging/Diagnostic  Tests: CT L-SPINE NO CHARGE Result Date: 10/23/2023 IMPRESSION: 1. No CT evidence for acute osseous abnormality. 2. Congenitally short appearing pedicles with multilevel moderate to severe canal stenosis. 3. Posterior arch defects at L4 and L5 with chronic soft tissue density containing calcifications in the midline posterior soft tissues extending from the skin surface to the dorsal canal. This is a chronic finding.   CT ABDOMEN PELVIS W CONTRAST Result Date: 10/23/2023 IMPRESSION: 1. No CT evidence for acute intra-abdominal or pelvic abnormality. 2. Redemonstrated extensive decubitus ulcers with chronic fragmentation and osseous destructive change involving the bilateral hip joints. Some progressive resorption of right proximal femoral bone fragments compared to the prior pelvic CT but in general, overall less soft tissue thickening and gas within the joint spaces compared to the prior CT. Small 3.7 cm rim enhancing collection at the posterior right hip may reflect small inflammatory collection or abscess. Previously described sclerosis and osseous erosive change in deformity of the pubic bones, ischium, and sacrococcygeal region are otherwise grossly unchanged. 3. Interval 11 mm hypodensity in the posterior right hepatic lobe, indeterminate in density. Nonemergent MRI evaluation may be obtained if felt clinically appropriate. 4. Splenomegaly. 5. Left abdominal colostomy with small parastomal hernia containing fat and small bowel but no obstruction. 6. Suprapubic catheter in the bladder. At least 3 large bladder stones measuring up to 4.3 cm. Aortic Atherosclerosis (ICD10-I70.0).   DG Chest 2 View Result Date: 10/23/2023 IMPRESSION: No acute cardiopulmonary abnormality.      Theophilus Pagan, MD 10/24/2023, 7:29 AM  PGY-3, [facility] Family Medicine FPTS Intern pager: [phone], text pages welcome Secure chat group [facility] Teaching Service

## 2023-10-24 NOTE — Assessment & Plan Note (Addendum)
 Iron  deficiency - continue home ferrous sulfate 325 mg  PVCs - continue home Flecanide 100 mg BID HTN - continue home lisinopril -hydrochlorothiazide  20-25 mg and metoprolol  succinate 50 mg 24 hr  HLD - continue home rosuvastatin  40 mg daily

## 2023-10-24 NOTE — Assessment & Plan Note (Addendum)
 Iron  deficiency - continue home ferrous sulfate 325 mg  PVCs - continue home Flecanide 100 mg BID HTN - continue home lisinopril -hydrochlorothiazide  20-25 mg and metoprolol  succinate 50 mg 24 hr  HLD - continue home rosuvastatin  40 mg daily  Suprapubic catheter - clean/dry catheter site with normal UOP Colostomy - clean/dry colostomy site with normal stool output

## 2023-10-24 NOTE — Hospital Course (Addendum)
 Anthony [name] is a 53 y.o.male with a history of paraplegia secondary to spina bifida, chronic sacral ulcers, indwelling suprapubic catheter, and anxiety who was admitted to the family medicine teaching Service at [facility] for fever, tachycardia, leukocytosis, suspected due to chronic sacral ulcers. His hospital course is detailed below:  Sepsis Chronic sacral ulcers Presented meeting sepsis criteria and started on cefepime , linezolid , metronidazole  (10/3-discharge) given history of E faecalis resistant to both ampicillin  and vancomycin. Blood cultures were negative. Wound cultures showed rare E. Faecalis and candida. CT of the pelvis revealed a 3.7 cm collection at the posterior right hip, not definitively abscess formation.  Sacral wounds do not appear to be acutely purulent.  Concern for chronic osteomyelitis. Orthopedics consulted for opinion on wound culture and possible debridement.  Recommended no surgical intervention at this time, continue with wound care and off loading. ID consulted given chronic nature of wound infection, they recommended 6-8 weeks of IV antibiotics with cefepime , metronidazole  and daptomycin  per OPAT protocols. Fluconazole  x7 day was also added given wound culture grew Candida. They will follow him outpatient. RD was also consulted to optimize wound healing and ordered wound healing labs of which Vitamin A , C, and zinc  are pending at the time of discharge.  Right thumb injury Patient noted that he had incidentally injured his thumb while using pruning shears at home.  Uncertain of timing of last tetanus booster. Tdap administered during admission.   Other chronic conditions were medically managed with home medications and formulary alternatives as necessary (suprapubic catheter, colostomy, hypertension, hyperlipidemia, PVCs)  PCP Follow-up Recommendations: Suprapubic cath needs to be changed on 11/1. Follow up in ID clinic Weekly CMP, ESR and CRP Interval 11 mm hypodensity  in the posterior right hepatic lobe, indeterminate in density. Nonemergent MRI evaluation may be obtained if felt clinically appropriate.

## 2023-10-24 NOTE — Consult Note (Signed)
 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  Using a Q tip applicator apply Vashe moistened gauze to wound bed 2 times daily and prn soiling. Cover with silicone or ABD pad whichever is preferred.  Cleanse sacral wound with Vashe, do not rinse.  Apply 1/4 thick layer of Santyl  to wound bed, using a Q tip applicator insert saline moistened gauze into any depth, cover with dry gauze and silicone foam or ABD pad whichever is preferred.   Patient should be placed on low air loss mattress for pressure redistribution and moisture management.    Patient with colostomy; on last assessment 1 3/4 LUQ.  Will place supply orders for 2 3/4 pouching system; 2 3/4 skin barrier Gerlean #2, 2 3/4 pouch 649 and 2 barrier ring Gerlean [phone].    POC discussed with bedside nurse.  WOC team will not follow. Re-consult if further needs arise.   Thank you,    Powell Bar MSN, RN-BC, [employer]

## 2023-10-25 DIAGNOSIS — A419 Sepsis, unspecified organism: Secondary | ICD-10-CM | POA: Diagnosis not present

## 2023-10-25 DIAGNOSIS — Z9889 Other specified postprocedural states: Secondary | ICD-10-CM | POA: Diagnosis not present

## 2023-10-25 DIAGNOSIS — Q058 Sacral spina bifida without hydrocephalus: Secondary | ICD-10-CM | POA: Diagnosis not present

## 2023-10-25 LAB — BASIC METABOLIC PANEL WITH GFR
Anion gap: 9 (ref 5–15)
BUN: 7 mg/dL (ref 6–20)
CO2: 29 mmol/L (ref 22–32)
Calcium: 8.6 mg/dL — ABNORMAL LOW (ref 8.9–10.3)
Chloride: 101 mmol/L (ref 98–111)
Creatinine, Ser: 0.76 mg/dL (ref 0.61–1.24)
GFR, Estimated: >60 mL/min (ref >60)
Glucose, Bld: 118 mg/dL — ABNORMAL HIGH (ref 70–99)
Potassium: 3.5 mmol/L (ref 3.5–5.1)
Sodium: 139 mmol/L (ref 135–145)

## 2023-10-25 LAB — CBC
HCT: 28.3 % — ABNORMAL LOW (ref 39.0–52.0)
Hemoglobin: 8.6 g/dL — ABNORMAL LOW (ref 13.0–17.0)
MCH: 22.5 pg — ABNORMAL LOW (ref 26.0–34.0)
MCHC: 30.4 g/dL (ref 30.0–36.0)
MCV: 73.9 fL — ABNORMAL LOW (ref 80.0–100.0)
Platelets: 467 K/uL — ABNORMAL HIGH (ref 150–400)
RBC: 3.83 MIL/uL — ABNORMAL LOW (ref 4.22–5.81)
RDW: 17.2 % — ABNORMAL HIGH (ref 11.5–15.5)
WBC: 7.8 K/uL (ref 4.0–10.5)
nRBC: 0 % (ref 0.0–0.2)

## 2023-10-25 NOTE — Evaluation (Signed)
 Occupational Therapy Evaluation Patient Details Name: Anthony [name] MRN: [id number] DOB: [birth year] Today's Date: 10/25/2023   History of Present Illness   Pt is a 53 y.o. male admitted 10/3 for pressure wound, found to be septic.PMH: spina bifida, recurrent decubitus ulcer, septic arthritis of bilateral hips, chronic osteomyelitis, DVT, UTI     Clinical Impressions Pt admitted based on above, and was seen based on problem list below. PTA pt was independent with ADLs at w/c level. Today pt is requiring set up assist for w/c level ADLs. Bed mobility was supervision for safety. Pt limited by sacral wounds, creating safety risk with functional transfers. Pt would benefit from further acute services to increase BUE strength to improve hip clearance for transfers. Recommendation of <3 hours of skilled rehab daily. OT will continue to follow acutely to maximize functional independence.        If plan is discharge home, recommend the following:   A little help with walking and/or transfers;A little help with bathing/dressing/bathroom;Assistance with cooking/housework     Functional Status Assessment   Patient has had a recent decline in their functional status and demonstrates the ability to make significant improvements in function in a reasonable and predictable amount of time.     Equipment Recommendations   None recommended by OT     Recommendations for Other Services         Precautions/Restrictions   Precautions Precautions: Fall Recall of Precautions/Restrictions: Intact Restrictions Weight Bearing Restrictions Per Provider Order: No     Mobility Bed Mobility Overal bed mobility: Needs Assistance Bed Mobility: Supine to Sit, Sit to Supine, Rolling Rolling: Modified independent (Device/Increase time)   Supine to sit: Supervision Sit to supine: Supervision   General bed mobility comments: Manages LE ind, uses bed rails to get to sitting EOB. assist with  lines and occasional cues to protect skin integrity. Mod I for rolling with use of rails.    Transfers     General transfer comment: Deferred today      Balance Overall balance assessment: No apparent balance deficits (not formally assessed)       ADL either performed or assessed with clinical judgement   ADL Overall ADL's : Needs assistance/impaired Eating/Feeding: Set up;Sitting   Grooming: Set up;Sitting   Upper Body Bathing: Set up;Sitting   Lower Body Bathing: Set up;Sitting/lateral leans   Upper Body Dressing : Set up;Sitting   Lower Body Dressing: Set up;Sitting/lateral leans       Functional mobility during ADLs: Supervision/safety General ADL Comments: Largely set up for sitting ADLs, good sitting balance for dynamic tasks     Vision Baseline Vision/History: 0 No visual deficits Vision Assessment?: No apparent visual deficits            Pertinent Vitals/Pain Pain Assessment Pain Assessment: No/denies pain     Extremity/Trunk Assessment Upper Extremity Assessment Upper Extremity Assessment: RUE deficits/detail RUE Deficits / Details: Hx of R rotator cuff tear, largely WFL, mild AROM and strength deficits RUE: Shoulder pain with ROM RUE Sensation: WNL RUE Coordination: WNL   Lower Extremity Assessment Lower Extremity Assessment: Defer to PT evaluation RLE Deficits / Details: paraplegic; no sensation LLE Deficits / Details: Paraplegic; no sensation   Cervical / Trunk Assessment Cervical / Trunk Assessment: Normal   Communication Communication Communication: No apparent difficulties   Cognition Arousal: Alert Behavior During Therapy: WFL for tasks assessed/performed     Following commands: Intact       Cueing  General Comments   Cueing  Techniques: Verbal cues  Pt with bil sacral wounds, covered with dressings           Home Living Family/patient expects to be discharged to:: Skilled nursing facility       Additional  Comments: Pt has wooden sliding board, and w/c that he purchased himself      Prior Functioning/Environment Prior Level of Function : Independent/Modified Independent             Mobility Comments: Lateral scoots mod I ADLs Comments: Ind, manages own ostomy and catheter    OT Problem List: Decreased strength   OT Treatment/Interventions: Self-care/ADL training;Therapeutic exercise;DME and/or AE instruction;Therapeutic activities;Patient/family education;Balance training      OT Goals(Current goals can be found in the care plan section)   Acute Rehab OT Goals Patient Stated Goal: To go to rehab OT Goal Formulation: With patient Time For Goal Achievement: 11/08/23 Potential to Achieve Goals: Good   OT Frequency:  Min 1X/week       AM-PAC OT 6 Clicks Daily Activity     Outcome Measure Help from another person eating meals?: None Help from another person taking care of personal grooming?: A Little Help from another person toileting, which includes using toliet, bedpan, or urinal?: A Little Help from another person bathing (including washing, rinsing, drying)?: A Little Help from another person to put on and taking off regular upper body clothing?: A Little Help from another person to put on and taking off regular lower body clothing?: A Little 6 Click Score: 19   End of Session Nurse Communication: Mobility status  Activity Tolerance: Patient tolerated treatment well Patient left: in bed;with call bell/phone within reach  OT Visit Diagnosis: Other abnormalities of gait and mobility (R26.89);Muscle weakness (generalized) (M62.81)                Time: 8775-8753 OT Time Calculation (min): 22 min Charges:  OT General Charges $OT Visit: 1 Visit OT Evaluation $OT Eval Moderate Complexity: 1 Mod  Adrianne BROCKS, OT  Acute Rehabilitation Services Office [phone] Secure chat preferred   Adrianne GORMAN Savers 10/25/2023, 2:41 PM

## 2023-10-25 NOTE — Assessment & Plan Note (Addendum)
 Iron  deficiency - continue home ferrous sulfate 325 mg daily PVCs - continue home Flecanide 100 mg BID HTN - continue home lisinopril -hydrochlorothiazide  20-25 mg and metoprolol  succinate 50 mg daily HLD - continue home rosuvastatin  40 mg daily  Suprapubic catheter - stable Colostomy - stable

## 2023-10-25 NOTE — Plan of Care (Signed)
   Problem: Education: Goal: Knowledge of General Education information will improve Description Including pain rating scale, medication(s)/side effects and non-pharmacologic comfort measures Outcome: Progressing

## 2023-10-25 NOTE — Assessment & Plan Note (Addendum)
 Small puncture wound from incident with pruning shears at home. Received Tdap.

## 2023-10-25 NOTE — Progress Notes (Signed)
     Daily Progress Note Intern Pager: [phone]  Patient name: Anthony [name] Medical record number: [id number] Date of birth: 1970-12-12 Age: 53 y.o. Gender: male  Primary Care Provider: Jerrie Gathers, DO Consultants: orthopedic surgery Code Status: Full  Pt Overview and Major Events to Date:  10/23/23 - Admitted  Assessment and Plan:  52yo male with PMHx paraplegia 2/2 spina bifida, chronic sacral ulcers, HTN, HLD who was admitted for sepsis suspected in setting of extensive pressure injuries. Continuing IV antibiotics today, pending ortho evaluation.  Assessment & Plan Sepsis (HCC) Unstageable pressure ulcer of sacral region (HCC) Labwork stable this morning, remains afebrile. Pending orthopedic surgery evaluation. Blood cultures no growth <12h.  - Ortho consulted, plans to see 10/6 - Continue Cefepime  q8h, Linezolid  q12h, Metronidazole  q12h (10/3-) narrow as able - Tylenol  650mg  q6h PRN - Consider IR, ID consults if worsening/pending culture results - PT/OT eval and treat - am CBC/BMP Injury of right thumb Small puncture wound from incident with pruning shears at home. Received Tdap.  Chronic health problem Iron  deficiency - continue home ferrous sulfate 325 mg daily PVCs - continue home Flecanide 100 mg BID HTN - continue home lisinopril -hydrochlorothiazide  20-25 mg and metoprolol  succinate 50 mg daily HLD - continue home rosuvastatin  40 mg daily  Suprapubic catheter - stable Colostomy - stable   FEN/GI: Regular diet PPx: Lovenox  Dispo:SNF pending clinical improvement   Subjective:  NAEON. Feels like he is feeling better this morning  Objective: Temp:  [98 F (36.7 C)-99.8 F (37.7 C)] 99.1 F (37.3 C) (10/04 2309) Pulse Rate:  [76-88] 84 (10/04 2309) Resp:  [12-25] 12 (10/04 2309) BP: (107-139)/(60-76) 107/68 (10/04 2309) SpO2:  [93 %-98 %] 98 % (10/04 2309) Weight:  [77.7 kg] 77.7 kg (10/04 1313) Physical Exam: General: Laying on right side,  NAD Cardiovascular: RRR, no murmur Respiratory: CTAB, breathing comfortably on RA Sacral and hip wounds covered with clean, dry dressings. Bilateral legs with decreased muscle tone, dryness to bilateral feet.  Small puncture wound to right thumb, no sign of infection  Laboratory: Most recent CBC Lab Results  Component Value Date   WBC 7.8 10/25/2023   HGB 8.6 (L) 10/25/2023   HCT 28.3 (L) 10/25/2023   MCV 73.9 (L) 10/25/2023   PLT 467 (H) 10/25/2023   Most recent BMP    Latest Ref Rng & Units 10/25/2023    2:32 AM  BMP  Glucose 70 - 99 mg/dL 881   BUN 6 - 20 mg/dL 7   Creatinine 9.38 - 8.75 mg/dL 9.23   Sodium 864 - 854 mmol/L 139   Potassium 3.5 - 5.1 mmol/L 3.5   Chloride 98 - 111 mmol/L 101   CO2 22 - 32 mmol/L 29   Calcium  8.9 - 10.3 mg/dL 8.6     Lakrisha Iseman, DO 10/25/2023, 4:17 AM  PGY-2, [place] [facility] FPTS Intern pager: [phone], text pages welcome Secure chat group [facility] Teaching Service

## 2023-10-25 NOTE — Evaluation (Signed)
 Physical Therapy Evaluation Patient Details Name: Anthony [name] MRN: [id number] DOB: [birth year] Today's Date: 10/25/2023  History of Present Illness  Pt is a 53 y.o. male admitted 10/3 for pressure wound, found to be septic.PMH: spina bifida, recurrent decubitus ulcer, septic arthritis of bilateral hips, chronic osteomyelitis, DVT, UTI  Clinical Impression  Pt is presenting at supervision to mod I for bed mobility. Deferred transfers today. Pt was able to scoot up EOB at [facility] with good clearance. Pt needs to be able to clear hips when transferring or use different equipment in order to stop shearing forces and decrease risk for wounds. Due to pt current functional status, home set up and available assistance at home recommending skilled physical therapy services < 3 hours/day in order to address strength, balance and functional mobility to decrease risk for falls, injury, immobility, skin break down and re-hospitalization.          If plan is discharge home, recommend the following: Assist for transportation;Assistance with cooking/housework   Can travel by private vehicle   No    Equipment Recommendations Wheelchair cushion (measurements PT);Wheelchair (measurements PT)     Functional Status Assessment Patient has had a recent decline in their functional status and demonstrates the ability to make significant improvements in function in a reasonable and predictable amount of time.     Precautions / Restrictions Precautions Precautions: Fall Recall of Precautions/Restrictions: Intact Restrictions Weight Bearing Restrictions Per Provider Order: No      Mobility  Bed Mobility Overal bed mobility: Needs Assistance Bed Mobility: Supine to Sit, Sit to Supine, Rolling Rolling: Modified independent (Device/Increase time)   Supine to sit: Supervision Sit to supine: Supervision   General bed mobility comments: Manages LE ind, uses bed rails to get to sitting EOB. assist with lines  and occasional cues to protect skin integrity. Mod I for rolling with use of rails.    Transfers     General transfer comment: Deferred today       Balance Overall balance assessment: No apparent balance deficits (not formally assessed)         Pertinent Vitals/Pain Pain Assessment Pain Assessment: No/denies pain    Home Living Family/patient expects to be discharged to:: Skilled nursing facility     Prior Function Prior Level of Function : Independent/Modified Independent   Mobility Comments: has been transferring Mod I ADLs Comments: Was ind with ADL's per pt, uses transportation services     Extremity/Trunk Assessment   Upper Extremity Assessment Upper Extremity Assessment: Defer to OT evaluation    Lower Extremity Assessment Lower Extremity Assessment: RLE deficits/detail;LLE deficits/detail RLE Deficits / Details: paraplegic; no sensation LLE Deficits / Details: Paraplegic; no sensation    Cervical / Trunk Assessment Cervical / Trunk Assessment: Normal  Communication   Communication Communication: No apparent difficulties    Cognition Arousal: Alert Behavior During Therapy: WFL for tasks assessed/performed   PT - Cognitive impairments: No apparent impairments     Following commands: Intact       Cueing Cueing Techniques: Verbal cues     General Comments General comments (skin integrity, edema, etc.): Pt has opens wounds on his glutes bil and sacral area with heavy exudate.        Assessment/Plan    PT Assessment Patient needs continued PT services  PT Problem List Decreased mobility;Decreased skin integrity       PT Treatment Interventions DME instruction;Functional mobility training;Therapeutic activities;Therapeutic exercise;Patient/family education;Wheelchair mobility training    PT Goals (Current goals can be  found in the Care Plan section)  Acute Rehab PT Goals Patient Stated Goal: to get stronger and for wounds to heal as much  as they can. PT Goal Formulation: With patient Time For Goal Achievement: 11/08/23 Potential to Achieve Goals: Fair Additional Goals Additional Goal #1: Pt will manage W/C ind in order to increase ind    Frequency Min 1X/week        AM-PAC PT 6 Clicks Mobility  Outcome Measure Help needed turning from your back to your side while in a flat bed without using bedrails?: A Little Help needed moving from lying on your back to sitting on the side of a flat bed without using bedrails?: A Little Help needed moving to and from a bed to a chair (including a wheelchair)?: A Little Help needed standing up from a chair using your arms (e.g., wheelchair or bedside chair)?: Total Help needed to walk in hospital room?: Total Help needed climbing 3-5 steps with a railing? : Total 6 Click Score: 12    End of Session   Activity Tolerance: Patient tolerated treatment well Patient left: in bed;with call bell/phone within reach Nurse Communication: Mobility status PT Visit Diagnosis: Other abnormalities of gait and mobility (R26.89)    Time: 8773-8753 PT Time Calculation (min) (ACUTE ONLY): 20 min   Charges:   PT Evaluation $PT Eval Low Complexity: 1 Low   PT General Charges $$ ACUTE PT VISIT: 1 Visit         Dorothyann Maier, DPT, CLT  Acute Rehabilitation Services Office: [phone] (Secure chat preferred)   Dorothyann VEAR Maier 10/25/2023, 1:59 PM

## 2023-10-25 NOTE — Assessment & Plan Note (Addendum)
 Labwork stable this morning, remains afebrile. Pending orthopedic surgery evaluation. Blood cultures no growth <12h.  - Ortho consulted, plans to see 10/6 - Continue Cefepime  q8h, Linezolid  q12h, Metronidazole  q12h (10/3-) narrow as able - Tylenol  650mg  q6h PRN - Consider IR, ID consults if worsening/pending culture results - PT/OT eval and treat - am CBC/BMP

## 2023-10-26 DIAGNOSIS — L89150 Pressure ulcer of sacral region, unstageable: Secondary | ICD-10-CM | POA: Diagnosis not present

## 2023-10-26 DIAGNOSIS — Q058 Sacral spina bifida without hydrocephalus: Secondary | ICD-10-CM | POA: Diagnosis not present

## 2023-10-26 DIAGNOSIS — Z9889 Other specified postprocedural states: Secondary | ICD-10-CM | POA: Diagnosis not present

## 2023-10-26 DIAGNOSIS — A419 Sepsis, unspecified organism: Secondary | ICD-10-CM | POA: Diagnosis not present

## 2023-10-26 LAB — CBC
HCT: 30.2 % — ABNORMAL LOW (ref 39.0–52.0)
Hemoglobin: 9 g/dL — ABNORMAL LOW (ref 13.0–17.0)
MCH: 22.1 pg — ABNORMAL LOW (ref 26.0–34.0)
MCHC: 29.8 g/dL — ABNORMAL LOW (ref 30.0–36.0)
MCV: 74.2 fL — ABNORMAL LOW (ref 80.0–100.0)
Platelets: 506 K/uL — ABNORMAL HIGH (ref 150–400)
RBC: 4.07 MIL/uL — ABNORMAL LOW (ref 4.22–5.81)
RDW: 17.2 % — ABNORMAL HIGH (ref 11.5–15.5)
WBC: 6.2 K/uL (ref 4.0–10.5)
nRBC: 0 % (ref 0.0–0.2)

## 2023-10-26 LAB — BASIC METABOLIC PANEL WITH GFR
Anion gap: 11 (ref 5–15)
BUN: 12 mg/dL (ref 6–20)
CO2: 26 mmol/L (ref 22–32)
Calcium: 8.5 mg/dL — ABNORMAL LOW (ref 8.9–10.3)
Chloride: 103 mmol/L (ref 98–111)
Creatinine, Ser: 0.7 mg/dL (ref 0.61–1.24)
GFR, Estimated: >60 mL/min (ref >60)
Glucose, Bld: 102 mg/dL — ABNORMAL HIGH (ref 70–99)
Potassium: 3.4 mmol/L — ABNORMAL LOW (ref 3.5–5.1)
Sodium: 140 mmol/L (ref 135–145)

## 2023-10-26 MED ORDER — POTASSIUM CHLORIDE CRYS ER 20 MEQ PO TBCR
40.0000 meq | EXTENDED_RELEASE_TABLET | Freq: Once | ORAL | Status: AC
  Administered 2023-10-26: 40 meq via ORAL
  Filled 2023-10-26: qty 2

## 2023-10-26 MED ORDER — POTASSIUM CHLORIDE CRYS ER 20 MEQ PO TBCR
40.0000 meq | EXTENDED_RELEASE_TABLET | Freq: Four times a day (QID) | ORAL | Status: DC
  Administered 2023-10-26: 40 meq via ORAL
  Filled 2023-10-26: qty 2

## 2023-10-26 NOTE — Assessment & Plan Note (Signed)
 Labwork stable this morning, remains afebrile. Pending orthopedic surgery evaluation. Blood cultures no growth 2 days.  - Ortho consulted, plans to see today, appreciate recommendations  - Continue Cefepime  q8h, Linezolid  q12h, Metronidazole  q12h (10/3-) narrow as able - Tylenol  650mg  q6h PRN - Consider IR, ID consults if worsening/pending culture results - PT/OT eval and treat - am CBC/BMP

## 2023-10-26 NOTE — NC FL2 (Signed)
 [place]  MEDICAID FL2 LEVEL OF CARE FORM     IDENTIFICATION  Patient Name: Anthony [name] Birthdate: 07-20-1970 Sex: male Admission Date (Current Location): 10/23/2023  [facility] and [place] Number:  Producer, television/film/video and Address:  [facility], [address], [place], [place] [address]      Provider Number: [id number]  Attending Physician Name and Address:  Madelon Sayan HERO, DO  Relative Name and Phone Number:  Gracie Spence Benne; [phone] and Salomon Sharps; Uncle; [phone]    Current Level of Care: Hospital Recommended Level of Care: Skilled Nursing Facility Prior Approval Number:    Date Approved/Denied:   PASRR Number: [id number]  Discharge Plan: SNF    Current Diagnoses: Patient Active Problem List   Diagnosis Date Noted   Chronic health problem 10/24/2023   Unstageable pressure ulcer of sacral region [facility]) 10/24/2023   Injury of right thumb 10/24/2023   History of suprapubic catheter 10/24/2023   Thrombophilia 09/08/2022   Advanced Directives in ACP section 09/07/2022   Polymicrobial bacterial infection 08/28/2022   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 08/16/2022   Pressure injury, stage 4, with infection (HCC) 08/05/2022   Acute deep vein thrombosis (DVT) of axillary vein of right upper extremity (HCC) 08/02/2022   Anemia of infection and chronic disease 07/26/2022   Chronic osteomyelitis of pelvic region (HCC) 07/26/2022   Sepsis (HCC) 07/26/2022   Febrile illness 02/27/2022   Pyogenic arthritis of left hip (HCC)    Septic arthritis of bilateral hips (HCC) 09/03/2021   Neurogenic bladder 03/06/2017   Suprapubic catheter (HCC) 03/06/2017   Paraplegia (HCC) 01/29/2017   Decubitus ulcer due to spina bifida (HCC) 01/27/2017   Hyperlipidemia 03/11/2014   Spina bifida (HCC) 09/30/2012   Acute Right shoulder pain 01/27/2011   Allergic rhinitis 06/01/2007   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   VENOUS INSUFFICIENCY, CHRONIC 03/19/2006     Orientation RESPIRATION BLADDER Height & Weight     Time, Situation, Place, Self  Normal (Room Air) Incontinent, Indwelling catheter (Suprapubic Catheter Latex 20 Fr.) Weight: 171 lb 4.8 oz (77.7 kg) Height:  5' 4 (162.6 cm)  BEHAVIORAL SYMPTOMS/MOOD NEUROLOGICAL BOWEL NUTRITION STATUS      Incontinent, Colostomy Diet (Please see discharge summary)  AMBULATORY STATUS COMMUNICATION OF NEEDS Skin   Extensive Assist Verbally PU Stage and Appropriate Care (Pressure Injury Hip Anterior;Left Stage 4 - Full thickness tissue loss with exposed bone, tendon or muscle.)                       Personal Care Assistance Level of Assistance  Bathing, Feeding, Dressing Bathing Assistance: Maximum assistance Feeding assistance: Maximum assistance Dressing Assistance: Maximum assistance     Functional Limitations Info             SPECIAL CARE FACTORS FREQUENCY  PT (By licensed PT), OT (By licensed OT)     PT Frequency: 5x OT Frequency: 5x            Contractures Contractures Info: Not present    Additional Factors Info  Code Status, Allergies Code Status Info: Full Code Allergies Info: Firvanq (Vancomycin)           Current Medications (10/26/2023):  This is the current hospital active medication list Current Facility-Administered Medications  Medication Dose Route Frequency Provider Last Rate Last Admin   acetaminophen  (TYLENOL ) tablet 650 mg  650 mg Oral Q6H PRN Baker, Amelia G, DO   650 mg at 10/26/23 9355   Or  acetaminophen  (TYLENOL ) suppository 650 mg  650 mg Rectal Q6H PRN Baker, Amelia G, DO       ceFEPIme  (MAXIPIME ) 2 g in sodium chloride  0.9 % 100 mL IVPB  2 g Intravenous Q8H Baker, Amelia G, DO 200 mL/hr at 10/26/23 0819 2 g at 10/26/23 9180   collagenase  (SANTYL ) ointment   Topical Daily Rumball, Alison M, DO   Given at 10/26/23 9177   enoxaparin  (LOVENOX ) injection 40 mg  40 mg Subcutaneous Daily Baker, Amelia G, DO   40 mg at 10/26/23 0821   ferrous  sulfate tablet 325 mg  325 mg Oral Q breakfast Baker, Amelia G, DO   325 mg at 10/26/23 9178   flecainide  (TAMBOCOR ) tablet 100 mg  100 mg Oral BID Baker, Amelia G, DO   100 mg at 10/26/23 9178   lisinopril  (ZESTRIL ) tablet 20 mg  20 mg Oral Daily Rumball, Alison M, DO   20 mg at 10/26/23 9178   And   hydrochlorothiazide  (HYDRODIURIL ) tablet 25 mg  25 mg Oral Daily Rumball, Alison M, DO   25 mg at 10/26/23 0820   linezolid  (ZYVOX ) IVPB 600 mg  600 mg Intravenous Q12H Baker, Amelia G, DO 300 mL/hr at 10/26/23 1103 600 mg at 10/26/23 1103   liver oil-zinc  oxide (DESITIN) 40 % ointment   Topical BID Rumball, Alison M, DO   Given at 10/26/23 9177   metoprolol  succinate (TOPROL -XL) 24 hr tablet 50 mg  50 mg Oral Daily Baker, Amelia G, DO   50 mg at 10/26/23 9178   metroNIDAZOLE  (FLAGYL ) IVPB 500 mg  500 mg Intravenous Q12H Cleotilde Perkins, DO 100 mL/hr at 10/26/23 0927 500 mg at 10/26/23 9072   rosuvastatin  (CRESTOR ) tablet 40 mg  40 mg Oral Daily Baker, Amelia G, DO   40 mg at 10/26/23 0820     Discharge Medications: Please see discharge summary for a list of discharge medications.  Relevant Imaging Results:  Relevant Lab Results:   Additional Information SS# [id number]  Lauraine FORBES Saa, LCSWA

## 2023-10-26 NOTE — TOC Initial Note (Addendum)
 Transition of Care [facility]) - Initial/Assessment Note    Patient Details  Name: Anthony [name] MRN: [id number] Date of Birth: 1970/12/15  Transition of Care [facility]) CM/SW Contact:    Lauraine FORBES Saa, LCSWA Phone Number: 10/26/2023, 3:52 PM  Clinical Narrative:                  3:52 PM [name] introduced self and role to patient. Patient accepted [name] offer of SDOH (transportation, housing, utilities) resources. [name] provided resources and informed patient of therapy's recommendation of patient discharging to SNF. Patient was agreeable with recommendation and expressed preference in [facility]. [name] informed patient of [facility]'s SNF bed offer. Patient accepted SNF bed offer. [name] informed SNF of patient's bed acceptance. [name] informed patient of financial counseling consult for LTC Medicaid. Patient expressed understanding of the information and was agreeable. [name] will continue to follow.  Expected Discharge Plan: Skilled Nursing Facility Barriers to Discharge: Continued Medical Work up   Patient Goals and CMS Choice Patient states their goals for this hospitalization and ongoing recovery are:: SNF          Expected Discharge Plan and Services In-house Referral: Clinical Social Work   Post Acute Care Choice: Skilled Nursing Facility Living arrangements for the past 2 months: Single Family Home                                      Prior Living Arrangements/Services Living arrangements for the past 2 months: Single Family Home Lives with:: Self Patient language and need for interpreter reviewed:: Yes        Need for Family Participation in Patient Care: No (Comment) Care giver support system in place?: No (comment) Current home services: DME Criminal Activity/Legal Involvement Pertinent to Current Situation/Hospitalization: No - Comment as needed  Activities of Daily Living   ADL Screening (condition at time of admission) Independently performs ADLs?:  No Does the patient have a NEW difficulty with bathing/dressing/toileting/self-feeding that is expected to last >3 days?: No Does the patient have a NEW difficulty with getting in/out of bed, walking, or climbing stairs that is expected to last >3 days?: No Does the patient have a NEW difficulty with communication that is expected to last >3 days?: No Is the patient deaf or have difficulty hearing?: No Does the patient have difficulty seeing, even when wearing glasses/contacts?: No Does the patient have difficulty concentrating, remembering, or making decisions?: No  Permission Sought/Granted Permission sought to share information with : Family Supports, Oceanographer granted to share information with : No (Contact information on chart)  Share Information with NAME: Arland Degree  Permission granted to share info w AGENCY: SNF  Permission granted to share info w Relationship: Friend  Permission granted to share info w Contact Information: [phone]  Emotional Assessment Appearance:: Appears stated age Attitude/Demeanor/Rapport: Engaged Affect (typically observed): Accepting, Appropriate, Adaptable, Calm, Stable, Pleasant Orientation: : Oriented to Self, Oriented to Place, Oriented to  Time, Oriented to Situation Alcohol / Substance Use: Not Applicable Psych Involvement: No (comment)  Admission diagnosis:  Acute cystitis without hematuria [N30.00] Wound of sacral region, initial encounter [S31.000A] Sepsis (HCC) [A41.9] History of suprapubic catheter [Z98.890] Spina bifida, unspecified hydrocephalus presence, unspecified spinal region (HCC) [Q05.9] Sepsis without acute organ dysfunction, due to unspecified organism [facility]) [A41.9] Patient Active Problem List   Diagnosis Date Noted   Chronic health problem 10/24/2023   Unstageable  pressure ulcer of sacral region (HCC) 10/24/2023   Injury of right thumb 10/24/2023   History of suprapubic catheter  10/24/2023   Thrombophilia 09/08/2022   Advanced Directives in ACP section 09/07/2022   Polymicrobial bacterial infection 08/28/2022   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 08/16/2022   Pressure injury, stage 4, with infection (HCC) 08/05/2022   Acute deep vein thrombosis (DVT) of axillary vein of right upper extremity (HCC) 08/02/2022   Anemia of infection and chronic disease 07/26/2022   Chronic osteomyelitis of pelvic region (HCC) 07/26/2022   Sepsis (HCC) 07/26/2022   Febrile illness 02/27/2022   Pyogenic arthritis of left hip (HCC)    Septic arthritis of bilateral hips (HCC) 09/03/2021   Neurogenic bladder 03/06/2017   Suprapubic catheter (HCC) 03/06/2017   Paraplegia (HCC) 01/29/2017   Decubitus ulcer due to spina bifida (HCC) 01/27/2017   Hyperlipidemia 03/11/2014   Spina bifida (HCC) 09/30/2012   Acute Right shoulder pain 01/27/2011   Allergic rhinitis 06/01/2007   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   VENOUS INSUFFICIENCY, CHRONIC 03/19/2006   PCP:  Jerrie Gathers, DO Pharmacy:   [facility] - [place] - [address] [place] ([place]) [place] [address] Phone: [phone] Fax: [phone]     Social Drivers of Health (SDOH) Social History: SDOH Screenings   Food Insecurity: No Food Insecurity (10/24/2023)  Housing: High Risk (10/24/2023)  Transportation Needs: Unmet Transportation Needs (10/24/2023)  Utilities: At Risk (10/24/2023)  Alcohol Screen: Low Risk  (10/15/2022)  Depression (PHQ2-9): Low Risk  (10/15/2022)  Financial Resource Strain: Low Risk  (10/15/2022)  Physical Activity: Inactive (10/15/2022)  Social Connections: Moderately Integrated (10/15/2022)  Stress: No Stress Concern Present (10/15/2022)  Tobacco Use: Medium Risk (10/23/2023)  Health Literacy: Adequate Health Literacy (10/15/2022)   SDOH Interventions:     Readmission Risk Interventions     No data to display

## 2023-10-26 NOTE — Assessment & Plan Note (Signed)
 Small puncture wound from incident with pruning shears at home. Received Tdap.

## 2023-10-26 NOTE — Plan of Care (Signed)
  Problem: Education: Goal: Knowledge of General Education information will improve Description: Including pain rating scale, medication(s)/side effects and non-pharmacologic comfort measures Outcome: Progressing   Problem: Health Behavior/Discharge Planning: Goal: Ability to manage health-related needs will improve Outcome: Not Progressing   Problem: Clinical Measurements: Goal: Ability to maintain clinical measurements within normal limits will improve Outcome: Progressing Goal: Will remain free from infection Outcome: Not Progressing Goal: Diagnostic test results will improve Outcome: Progressing Goal: Respiratory complications will improve Outcome: Progressing Goal: Cardiovascular complication will be avoided Outcome: Progressing   Problem: Activity: Goal: Risk for activity intolerance will decrease Outcome: Progressing   Problem: Nutrition: Goal: Adequate nutrition will be maintained Outcome: Progressing   Problem: Coping: Goal: Level of anxiety will decrease Outcome: Progressing   Problem: Elimination: Goal: Will not experience complications related to bowel motility Outcome: Progressing Goal: Will not experience complications related to urinary retention Outcome: Progressing   Problem: Pain Managment: Goal: General experience of comfort will improve and/or be controlled Outcome: Progressing   Problem: Safety: Goal: Ability to remain free from injury will improve Outcome: Progressing   Problem: Skin Integrity: Goal: Risk for impaired skin integrity will decrease Outcome: Not Progressing

## 2023-10-26 NOTE — Assessment & Plan Note (Signed)
 Iron  deficiency - continue home ferrous sulfate 325 mg daily PVCs - continue home Flecanide 100 mg BID HTN - continue home lisinopril -hydrochlorothiazide  20-25 mg and metoprolol  succinate 50 mg daily HLD - continue home rosuvastatin  40 mg daily  Suprapubic catheter - stable Colostomy - stable

## 2023-10-26 NOTE — Care Management Important Message (Signed)
 Important Message  Patient Details  Name: Anthony [name] MRN: [id number] Date of Birth: 03/02/1970   Important Message Given:  Yes - Medicare IM     Claretta Deed 10/26/2023, 12:45 PM

## 2023-10-26 NOTE — Discharge Instructions (Signed)
 Thank you for letting us  care for you during your stay. You were admitted to the [facility] Medicine Teaching Service for fever due to chronic sacral ulcers. We consulted Infectious Disease and started  you on several medications during your stay which you will have to continue upon discharge.  - Please continue Cefepime  and Metronidazole  for 6 weeks total (10/4 - 11/14) in addition to Daptomycin  (10/9 - 11/19) - Please continue Fluconazole  for 7 days total (10/10 - 10/16) - Make sure to follow up with Infectious disease once you are discharged. - Suprapubic catheter needs to be changed 11/1  Please follow up with your primary care physician in 1 week.   If your symptoms worsen or return, please return to the hospital.  Please let us  know if you have questions about your stay at [facility].    [facility] assistance programs Crisis assistance programs  -[facility]eless[facility] Coordinated Entry Program. If you are experiencing homelessness in [place], [place] , your first point of contact should be Pensions consultant. You can reach Coordinated Entry by calling ([phone]) [phone] or by emailing [email or web address] [email or web address].  Community access points: [employer] [phone], [place]) every Tuesday from 9am-10am. [facility] ([address] [place]) every Wednesday from 8am-9am.   -[place] Coordinated Re-entry Daniel Mcal[place]: Dial 211 and request. Offers referrals to homeless shelters in the area.    -The [employer] (415)650-4837) offers several services to local families, as funding allows. The Emergency Assistance Program (EAP), which they administer, provides household goods, free food, clothing, and financial aid to people in need in the [place]  area. The EAP program does have some qualification, and counselors will interview clients for financial assistance by written referral only. Referrals need to be made  by the Department of Social Services or by other EAP approved human services agencies or charities in the area.  -Open Door Ministries of [employer], which can be reached at [phone], offers emergency assistance programs for those in need of help, such as food, rent assistance, a soup kitchen, shelter, and clothing. They are based in [facility]  but provide a number of services to those that qualify for assistance.   [facility] Department of Social Services may be able to offer temporary financial assistance and cash grants for paying rent and utilities, Help may be provided for local county residents who may be experiencing personal crisis when other resources, including government programs, are not available. Call [phone]  -High [employer] is a [employer] agency, The organization can offer emergency assistance for paying rent, Care[employer] Rx, utilities, food, household products and furniture. They offer extensive emergency and transitional housing for families, children and single women, and also run a Boy's and [employer]. Thrift Shops, Secondary school teacher, and other aid offered too. [address] [place], [place]  [address], [phone]  -[place] Low Income Energy Assistance Program -- This is offered for [facility] families. The federal government created [employer] Program provides a one-time cash grant payment to help eligible low-income families pay their electric and heating bills. [address], [place], [place]  [address], [phone]  -[place] Emergency Assistance -- A program offers emergency utility and rent funds for greater [employer] area residents. The program can also provide counseling and referrals to charities and government programs. Also provides food and a free meal program that serves lunch Mondays - Saturdays and dinner  seven days per week to individuals in the community.  [address] [place], [place]  [address], [phone]  -[employer] - Offers affordable apartment and housing communities across      [place] and [place]. The low income and seniors can access public housing, rental assistance to qualified applicants, and apply for the section 8 rent subsidy program. Other programs include Chiropractor and Engineer, maintenance. [address], [place], [place]  [address], dial [phone].  -The [facility] provides transitional housing to veterans and the disabled. Clients will also access other services too, including assistance in applying for Disability, life skills classes, case management, and assistance in finding permanent housing. [address], [place], [place]  [address], call [phone]  -[place] Transitional Housing through [employer] is for people who were just evicted or that are formerly homeless. The non-profit will also help then gain self-sufficiency, find a home or apartment to live in, and also provides information on rent assistance when needed. Phone [phone]  -The [place] [facility] [facility] helps low income, elderly, or disabled residents in seven counties in the [place] ([place], [place], [place], [place], [place], [place], [place], and [place]) save energy and reduce their utility bills by improving energy efficiency. Phone [phone].  -[employer] is located in the [place] [facility] in the [employer], [address], [place], [place] [address]. Parking is in the rear of the building. Phone: [phone]   General Email: info@gsohc .org  GHC provides free housing counseling assistance in locating affordable rental housing or housing with support services for families and individuals in crisis and the  chronically homeless. We provide potential resources for other housing needs like utilities. Our trained counselors also work with clients on budgeting and financial literacy in effort to empower them to take control of their financial situations. [employer] collaborates with homeless service providers and other stakeholders as part of the [employer] COC (Continuum of Care). The (COC) is a regional/local planning body that coordinates housing and services funding for homeless families and individuals. The role of [facility] in the COC is through housing counseling to work with people we serve on diversion strategies for those that are at imminent risk of becoming homeless. We also work with the Coordinated Assessment/Entry Specialist who attempts to find temporary solutions and/or connects the people to Housing First, Rapid Re-housing or transitional housing programs. Our Homelessness Prevention Housing Counselors meet with clients on business days (Monday-Fridays, except scheduled holidays) from 8:30 am to 4:30 pm.  Legal assistance for evictions, foreclosure, and more -If you need free legal advice on civil issues, such as foreclosures, evictions, Electronics engineer, government programs, domestic issues and more, Landscape architect of [place]  [facility]) is a Associate Professor firm that provides free legal services and counsel to lower income people, seniors, disabled, and others, The goal is to ensure everyone has access to justice and fair representation. Call them at ([phone].  [facility] for Housing and Community Studies can provide info about obtaining legal assistance with evictions. Phone [phone].  Data processing manager  The [employer], [employer]. offers job and Dispensing optician. Resources are focused on helping students obtain the skills and experiences that are necessary to compete in today's challenging and tight job market. The non-profit faith-based  community action agency offers internship trainings as well as classroom instruction. Classes are tailored to meet the needs of people in the [facility]  [place] region. [place], [place] [address], [phone]  Foreclosure prevention/Debt Services Family Services of the [employer] Credit Counseling Service inludes debt and foreclosure prevention programs for local families. This includes money management, financial advice, budget review and development of a written action plan with a Pensions consultant to help solve specific individual financial problems. In addition, housing and mortgage counselors can also provide pre- and post-purchase homeownership counseling, default resolution counseling (to prevent foreclosure) and reverse mortgage counseling. A Debt Management Program allows people and families with a high level of credit card or medical debt to consolidate and repay consumer debt and loans to creditors and rebuild positive credit ratings and scores. Contact ([phone] [phone].  Community clinics in [place] -Health Department [facility]: [address] [place], [place], [address]. [phone].  -Health Department [place] [facility]: [address], [facility], [address]. [phone].  -[facility] Network offers medical care through a group of doctors, pharmacies and other healthcare related agencies that offer services for low income, uninsured adults in [place]. Also offers adult Dental care and assistance with applying for an [employer]. Call [phone].   [facility]. This center provides low-cost health care to those without health insurance. Services offered include an onsite pharmacy. Phone [phone]. [address], [place].  -Medication Assistance Program serves as a link between pharmaceutical companies and patients to provide low cost or free  prescription medications. This service is available for residents who meet certain income restrictions and have no insurance coverage. PLEASE CALL [phone] [place]) OR [phone] ([place])  -One Step Further: Materials engineer, The [employer] Support & Nutrition Program, [employer]. Call [phone]/ [phone].  Food pantry and assistance -Urban Ministry-Food Bank: [address], [place] [address]. Phone [phone]  -[facility] Food Pantry: [address] [place], [place] [address]. [phone].  -Missionary Ministry: has the purpose of visiting the sick and shut-ins and provide for needs in the surrounding communities. Call ([phone]. Email: [email or web address] This program provides: Food box for seniors, Financial assistance, Food to meet basic nutritional needs.  -Meals on Wheels with Senior Resources: [facility] residents age 21 and over who are homebound and unable to obtain and prepare a nutritious meal for themselves are eligible for this service. There may be a waiting list in certain parts of [facility] if the route in that area is full. If you are in [facility] and [place] call [phone] to register. For all other areas call [phone] to register.  -Greater Dietitian: https://findfood.BargainContractor.si  TRANSPORTATION: -[employer] Department of Health: Call [facility] and [employer] at ([phone] for details. AttractionGuides.es  -Access [place]: Access [place] is the [employer] Agency's shared-ride transportation service for eligible riders who have a disability that prevents them from riding the fixed route bus. Call [phone]. Access [place] riders must pay a fare of $1.50 per trip, or may purchase a 10-ride punch card for $14.00 ($1.40 per ride) or a 40-ride punch card for $48.00  ($1.20 per ride).  -The Shepherd's WHEELS rideshare transportation service is provided for senior citizens (60+) who live independently within [place] city limits and are unable to drive or have limited access to transportation. Call [phone] to schedule an appointment.  -[place] Transportation: For Medicare or Medicaid recipients call [phone]) [phone]?S[facility]RA Ambulance, wheelchair fleeta, and ambulatory quotes available.   FLEEING VIOLENCE: -Family Services of the  [place]- 24/7 Crisis line (726)672-2240) -[facility] Family Justice Centers: [phone]) 641-SAFE [phone])  [place] 2-1-1 is another useful way to locate resources in the community. Visit ShedSizes.ch to find service information online. If you need additional assistance, 2-1-1 Referral Specialists are available 24 hours a day, every day by dialing 2-1-1 or ([phone] from any phone. The call is free, confidential, and available in any language.  Affordable Housing Search http://www.nchousingsearch.[facility])   M-F 8a-3p [address] [place]  [place], [place] [address] [phone] Services include: laundry, barbering, support groups, case management, phone & computer access, showers, AA/NA mtgs, mental health/substance abuse nurse, job skills class, disability information, VA assistance, spiritual classes, etc. Winter Shelter available when temperatures are less than 32 degrees.   HOMELESS SHELTERS [facility] at [facility]- Call [phone] ext. [phone] or ext. [phone]. Located at [address], [place], [place] [address]  [facility] Mens Shelter- Call [phone]. Located at [address] [place] [address].  [facility]- [employer]. Call [phone]. Office located at [address], [place] [address].  [facility] Family Housing through [place] [phone].  [facility] Family Shelter- Call [phone]. Located at [address] [place], [place], [place] [address].  Room at the Inn-For Pregnant mothers. Call [phone]. Located at [address] [place], [address].  [employer] Shelter of Hope-For men in [place]. Call [phone]. [facility]-Shelter in [place]. Call [phone].  Home of [employer] for [employer] ([phone]. Office located at [address] [place], [address].  [employer] be agreeable to help with chores. Call [phone] ext. [phone].  Men's: [address] [place],  [address]. Women's: [facility]  [address] [place], [place] [address]  Crisis Services [facility] Mobile Crisis Management- [phone]  [facility] [address] [place], [place] [address]. Phone: [phone] Rent/Utility Assistance in [facility]:  INNOVATIVE PATHWAYS [address] [place], [place] [address] [phone] Mon 8:00am - 6:00pm; Tue 8:00am - 6:00pm; Wed 8:00am - 6:00pm; Thu 8:00am - 6:00pm; Fri 8:00am - 6:00pm; Email: [email or web address] Eligibility: Residents of [place], [place], [place], [place], [place] and [place] that meet income limits. Call or text for eligibility screening.   [facility] MINISTRY [address] [place], [place], [place] [address] [phone] (Main: Rental Assistance) [phone] (Main: Utility Assistance) Mon 8:30am - 5:00pm; Tue 8:30am - 5:00pm; Wed 8:30am - 5:00pm; Thu 8:30am - 5:00pm; Fri 8:30am - 5:00pm; Website: http://www.greensborourbanministry.org/emergency-assistance-program Eligibility: People who have an unexpected crisis or emergency that can be verified. Must have some form of income and meet income limits. At the first of the month, only helps with rent/mortgage assistance for those who have court ordered eviction notices. Call for application information. Call for exact documents that will be needed. Examples of documents  that may be needed: Photo ID, Social Security cards for everyone in the household, and proof of income for previous 2 months. Copy of eviction notice for rent assistance and copy of final notice for utility assistance. Statements or receipts of bills for previous 2 months.   [facility] - [place] [address], [place], [place] [address] [phone] (Main) [phone] (Alternate) Mon 9:00am - 5:00pm; Tue 9:00am - 5:00pm; Wed 9:00am - 5:00pm; Thu 9:00am - 5:00pm; Fri 9:00am - 5:00pm; Website: [email or web address].[email or web address]/[place]/emergency-financial-assistance Email: [email or web address] Eligibility: People experiencing a housing crisis with past-due rent and/or utilities and meet  income limits. Must be willing to take part in 6 Call or visit website to download application. Return complete application by mail or email only. Documents: Help with Utilities: Photo ID, proof of household income, copies of monthly bills or receipts, and a final disconnection/shut-off notice. Help with Rent or Mortgage: Photo ID, proof of income, copies of monthly bills or receipts, and eviction notice. Help with Household Goods: Photo ID, proof of household income, copies of monthly bills or receipts, and a fire or flood report.  [facility] - [place] [address] [place], [place] [address] [phone] (Main) Mon 8:00am - 5:00pm; Tue 8:00am - 5:00pm; Wed 8:00am - 5:00pm; Thu 8:00am - 5:00pm; Fri 8:00am - 12:00pm; Website: http://[email or web address].salvationarmy.org/[place]/emergency-financial-assistance Email: [email or web address] Call for eligibility information. Apply :Utilities Assistance: Visit office by 8:30am on 1st and 4th Monday of each month to pick up application. Rent and Mortgage Assistance: Visit office by 8:30am on 2nd and 3rd Monday of each month to pick up application. NOTE: If Monday falls on a holiday applications can be picked up  the following Tuesday. Documents required will be listed on application.  [facility] - [place] [phone] (Main) Seen by appointment only. Call for more information. Eligibility: Meet income limits. Apply: Call for information on how to schedule an appointment. Each month there is a specific day to call to schedule an appointment. It is stated on the agency voicemail message. Appointments fill up quickly each month. Documents: Photo ID, copy of current utility bill.  [facility] HANDS [place] [address] [place], [place] [address] [phone] (Main) Tue 9:00am - 4:00pm; Wed 9:00am - 4:00pm; Thu 9:00am - 4:00pm; Website: http://www.[email or web address] Email: [email or web address]@gmail [email or web address] Eligibility: Utility Assistance: Meet income limits and be a Holiday representative. [employer] customers do not qualify. Must not have received utility assistance for another agency within the last 90 days. Rent Assistance: Residents of [place] who meet income limits. Must not have received rent assistance for another agency within the last 90 days. Apply: Call to schedule an appointment. Documents: Utility Assistance: Photo ID, City of [employer], copy of lease (if not paying a mortgage), proof of income, and monthly expenses. Rent Assistance: Photo ID, W-9 from the landlord, copy of the lease, proof of income, and a list of monthly expenses.  [facility] - [place] [address], [place], [place] [address] [phone] (Main: Help With Rent) [phone] (Main: Help With Utilities) Mon 9:00am - 4:00pm; Tue 9:00am - 4:00pm; Wed 9:00am - 4:00pm; Thu 9:00am - 4:00pm; Fri 9:00am - 4:00pm; Website: [email or web address].no Email: [email or web address] Eligibility: People experiencing a financial crisis. Apply: Call to schedule an appointment Wednesday,  7:30am. Documents: Photo ID, Social Security card, proof of income, and proof of address. Other documents may be required, depending on service. Call for more information.  LOW INCOME ENERGY ASSISTANCE PROGRAM DEPARTMENT OF SOCIAL SERVICES - [facility] [address], [place], [place] [address] [phone] (Main) Mon 8:00am - 5:00pm; Tue 8:00am - 5:00pm; Wed 8:00am - 5:00pm; Thu 8:00am - 5:00pm; Fri 8:00am - 5:00pm; Website: [email or web address] Eligibility: Meet income limits and resource guidelines. Each household is only eligible once, even if multiple members apply. Apply: Call to see if funds are available. Visit to complete an application, call to have 1 mailed, or apply online at [email or web address] NOTE: Households with a person age 83 and over or a person with a documented disability can apply beginning  December 1. Other households can apply beginning January 1. Documents: Photo ID, birth certificate, proof of household income, copy of utility bill, latest bank statement, the names and Social Security numbers for everyone in the household, and proof of disability if under age 50.  LOW INCOME ENERGY ASSISTANCE PROGRAM DEPARTMENT OF SOCIAL SERVICES - [facility] [address] [place], [place], [place] [address] [phone] (Main) Mon 8:00am - 5:00pm; Tue 8:00am - 5:00pm; Wed 8:00am - 5:00pm; Thu 8:00am - 5:00pm; Fri 8:00am - 5:00pm; Website: [email or web address] Eligibility: Meet income limits and resource guidelines. Each household is only eligible once, even if multiple members apply. Apply: Call to see if funds are available. Visit to complete an application, call to have 1 mailed, or apply online at [email or web address] NOTE: Households with a person age 83 and over or a person with a documented disability can apply beginning December 1. Other  households can apply beginning January 1. Documents: Photo ID, birth certificate, proof of household income, copy of utility bill, latest bank statement, the names and Social Security numbers for everyone in the household, and proof of disability if under age 81.

## 2023-10-26 NOTE — Progress Notes (Signed)
     Daily Progress Note Intern Pager: [phone]  Patient name: Anthony [name] Medical record number: [id number] Date of birth: 27-Aug-1970 Age: 53 y.o. Gender: male  Primary Care Provider: Jerrie Gathers, DO Consultants: Orthopedic surgery  Code Status: Full  Pt Overview and Major Events to Date:  10/3: Admitted  Medical Decision Making:  Anthony [name] is a 53 y.o. male with a PMH of paraplegia 2/2 spina bifida, chronic sacral ulcers, HTN, HLD, R rotator cuff tear who was admitted for sepsis suspected in setting of extensive pressure injuries.  Continuing IV antibiotics, pending orthopedic surgery evaluation today, appreciate recommendations. Assessment & Plan Sepsis (HCC) Unstageable pressure ulcer of sacral region (HCC) Labwork stable this morning, remains afebrile. Pending orthopedic surgery evaluation. Blood cultures no growth 2 days.  - Ortho consulted, plans to see today, appreciate recommendations  - Continue Cefepime  q8h, Linezolid  q12h, Metronidazole  q12h (10/3-) narrow as able - Tylenol  650mg  q6h PRN - Consider IR, ID consults if worsening/pending culture results - PT/OT eval and treat - am CBC/BMP Injury of right thumb Small puncture wound from incident with pruning shears at home. Received Tdap.  Chronic health problem Iron  deficiency - continue home ferrous sulfate 325 mg daily PVCs - continue home Flecanide 100 mg BID HTN - continue home lisinopril -hydrochlorothiazide  20-25 mg and metoprolol  succinate 50 mg daily HLD - continue home rosuvastatin  40 mg daily  Suprapubic catheter - stable Colostomy - stable  FEN/GI: Regular diet PPx: Lovenox  Dispo: SNF pending clinical improvement.  Subjective:  Patient states he is fine this morning. He did not sleep great due to right shoulder pain. He has had this pain for quite some time and states it feels the same as it always does. He denies any other pain or concerns at this time.   Objective: Temp:  [98 F (36.7  C)-99.1 F (37.3 C)] 98.4 F (36.9 C) (10/06 0535) Pulse Rate:  [77-94] 77 (10/06 0535) Resp:  [11-22] 11 (10/06 0535) BP: (103-117)/(60-63) 109/62 (10/06 0535) SpO2:  [96 %-97 %] 97 % (10/06 0535) Physical Exam: General: NAD Cardiovascular: RRR, no m/r/g Respiratory: CTAB, normal work of breathing on room air  Abdomen: soft, non-distended, non-tender to palpation, ostomy in place Extremities: no edema   Laboratory: Most recent CBC Lab Results  Component Value Date   WBC 6.2 10/26/2023   HGB 9.0 (L) 10/26/2023   HCT 30.2 (L) 10/26/2023   MCV 74.2 (L) 10/26/2023   PLT 506 (H) 10/26/2023   Most recent BMP    Latest Ref Rng & Units 10/26/2023    4:53 AM  BMP  Glucose 70 - 99 mg/dL 897   BUN 6 - 20 mg/dL 12   Creatinine 9.38 - 1.24 mg/dL 9.29   Sodium 864 - 854 mmol/L 140   Potassium 3.5 - 5.1 mmol/L 3.4   Chloride 98 - 111 mmol/L 103   CO2 22 - 32 mmol/L 26   Calcium  8.9 - 10.3 mg/dL 8.5     Imaging/Diagnostic Tests: No new imaging.   [name], DO 10/26/2023, 7:35 AM  PGY-1, [facility] FPTS Intern pager: [phone], text pages welcome Secure chat group [facility] Teaching Service

## 2023-10-27 DIAGNOSIS — Z933 Colostomy status: Secondary | ICD-10-CM

## 2023-10-27 DIAGNOSIS — M869 Osteomyelitis, unspecified: Secondary | ICD-10-CM

## 2023-10-27 DIAGNOSIS — Q059 Spina bifida, unspecified: Secondary | ICD-10-CM | POA: Diagnosis not present

## 2023-10-27 DIAGNOSIS — A419 Sepsis, unspecified organism: Secondary | ICD-10-CM

## 2023-10-27 DIAGNOSIS — L89150 Pressure ulcer of sacral region, unstageable: Secondary | ICD-10-CM

## 2023-10-27 DIAGNOSIS — M4628 Osteomyelitis of vertebra, sacral and sacrococcygeal region: Secondary | ICD-10-CM

## 2023-10-27 DIAGNOSIS — Z9889 Other specified postprocedural states: Secondary | ICD-10-CM | POA: Diagnosis not present

## 2023-10-27 DIAGNOSIS — S31000A Unspecified open wound of lower back and pelvis without penetration into retroperitoneum, initial encounter: Secondary | ICD-10-CM | POA: Diagnosis not present

## 2023-10-27 DIAGNOSIS — M8669 Other chronic osteomyelitis, multiple sites: Secondary | ICD-10-CM

## 2023-10-27 DIAGNOSIS — N390 Urinary tract infection, site not specified: Secondary | ICD-10-CM

## 2023-10-27 LAB — BASIC METABOLIC PANEL WITH GFR
Anion gap: 9 (ref 5–15)
BUN: 9 mg/dL (ref 6–20)
CO2: 24 mmol/L (ref 22–32)
Calcium: 8.5 mg/dL — ABNORMAL LOW (ref 8.9–10.3)
Chloride: 105 mmol/L (ref 98–111)
Creatinine, Ser: 0.6 mg/dL — ABNORMAL LOW (ref 0.61–1.24)
GFR, Estimated: >60 mL/min (ref >60)
Glucose, Bld: 102 mg/dL — ABNORMAL HIGH (ref 70–99)
Potassium: 3.6 mmol/L (ref 3.5–5.1)
Sodium: 138 mmol/L (ref 135–145)

## 2023-10-27 LAB — FERRITIN: Ferritin: 76 ng/mL (ref 24–336)

## 2023-10-27 LAB — CBC
HCT: 32.2 % — ABNORMAL LOW (ref 39.0–52.0)
Hemoglobin: 9.5 g/dL — ABNORMAL LOW (ref 13.0–17.0)
MCH: 21.9 pg — ABNORMAL LOW (ref 26.0–34.0)
MCHC: 29.5 g/dL — ABNORMAL LOW (ref 30.0–36.0)
MCV: 74.2 fL — ABNORMAL LOW (ref 80.0–100.0)
Platelets: 546 K/uL — ABNORMAL HIGH (ref 150–400)
RBC: 4.34 MIL/uL (ref 4.22–5.81)
RDW: 17.3 % — ABNORMAL HIGH (ref 11.5–15.5)
WBC: 7.3 K/uL (ref 4.0–10.5)
nRBC: 0 % (ref 0.0–0.2)

## 2023-10-27 NOTE — Plan of Care (Signed)
 FMTS Interim Progress Note  Spoke with ID on-call Dr. Zoanne regarding patient case, she recommended obtaining a wound culture to better guide antibiotic therapy.  ID will see patient in consultation.  Theophilus Pagan, MD 10/27/2023, 8:28 AM PGY-3, [facility] Family Medicine Service pager [phone]

## 2023-10-27 NOTE — TOC Progression Note (Addendum)
 Transition of Care [facility]) - Progression Note    Patient Details  Name: Anthony [name] MRN: [id number] Date of Birth: 1970/10/05  Transition of Care [facility]) CM/SW Contact  Lauraine FORBES Saa, LCSWA Phone Number: 10/27/2023, 12:49 PM  Clinical Narrative:     12:49 PM Per Resident MD, patient's anticipated IV regiment is TBD. [name] will submit SNF insurance authorization upon confirmed regiment. [name] will continue to follow.  Expected Discharge Plan: Skilled Nursing Facility Barriers to Discharge: Continued Medical Work up               Expected Discharge Plan and Services In-house Referral: Clinical Social Work   Post Acute Care Choice: Skilled Nursing Facility Living arrangements for the past 2 months: Single Family Home                                       Social Drivers of Health (SDOH) Interventions SDOH Screenings   Food Insecurity: No Food Insecurity (10/24/2023)  Housing: High Risk (10/24/2023)  Transportation Needs: Unmet Transportation Needs (10/24/2023)  Utilities: At Risk (10/24/2023)  Alcohol Screen: Low Risk  (10/15/2022)  Depression (PHQ2-9): Low Risk  (10/15/2022)  Financial Resource Strain: Low Risk  (10/15/2022)  Physical Activity: Inactive (10/15/2022)  Social Connections: Moderately Integrated (10/15/2022)  Stress: No Stress Concern Present (10/15/2022)  Tobacco Use: Medium Risk (10/23/2023)  Health Literacy: Adequate Health Literacy (10/15/2022)    Readmission Risk Interventions     No data to display

## 2023-10-27 NOTE — Consult Note (Signed)
 [facility]    Date of Admission:  10/23/2023           Reason for Consult: Antimicrobial management for possibility of sepsis.    Referring Provider: Dr. Madelon This very pleasant 53 year old patient with significant past medical history of spina bifida, bilateral hip septic arthritis, decub ulcer in the sacrum, paraplegia, chronic indwelling suprapubic catheter, diverting colostomy, paraplegia presented to the ER 10/23/2023 for complaints of intermittent fevers for the past 2 weeks or so.  Patient was also found to have some purulent drainage from the chronic wounds.  Patient unfortunately states that he had to do the dressing changes himself.  His mother was placed his mother passed away.  Meanwhile patient has now become homeless.  He did have leukocytosis of 13,000 and arrival.  It was about 2.  He had a grossly positive urine analysis.  The CAT scan was consistent with osteomyelitis of the back.  He was placed on IV cefepime , metronidazole  and linezolid .  Previous history of VRE infection present.  ID consult has been called for further evaluation.  Assessment: Sepsis, resolved. Source of sepsis-, complicated UTI. Acute on chronic osteomyelitis of sacrum, coccyx, bilateral hips.  Patient has had history of bilateral hip septic arthritis. Fevers, resolved. Patient is currently on IV cefepime , Flagyl  and linezolid .  Paraplegia. Diverting colostomy present. Chronic suprapubic catheter. Spina bifida. Orthopedic surgery consult appreciated, no further surgical intervention according to them.  Plan: Patient will need long-term IV antibiotics. Please send deep cultures from the wound.  Externally the wounds seem to have pink granulation tissue but there has been purulent drainage on the dressings. Will also follow-up on the urine cultures.   Patient is homeless.  He used to do his own dressing changes. Transfer to LTAC once he is stable from infection  point of view. Patient will need minimum of 6 to 8 weeks of IV antibiotics depending on how he progresses.  Local wound care. Nutritional support. Baseline ESR and CRP.  Principal Problem:   Sepsis (HCC) Active Problems:   Spina bifida (HCC)   Chronic health problem   Unstageable pressure ulcer of sacral region [facility])   Injury of right thumb   History of suprapubic catheter   Sacral wound   Osteomyelitis, pelvic region and thigh (HCC)    collagenase    Topical Daily   enoxaparin  (LOVENOX ) injection  40 mg Subcutaneous Daily   ferrous sulfate  325 mg Oral Q breakfast   flecainide   100 mg Oral BID   lisinopril   20 mg Oral Daily   And   hydrochlorothiazide   25 mg Oral Daily   liver oil-zinc  oxide   Topical BID   metoprolol  succinate  50 mg Oral Daily   rosuvastatin   40 mg Oral Daily    HPI: This very pleasant 53 year old patient with significant past medical history of spina bifida, bilateral hip septic arthritis, decub ulcer in the sacrum, paraplegia, chronic indwelling suprapubic catheter, diverting colostomy, paraplegia presented to the ER 10/23/2023 for complaints of intermittent fevers for the past 2 weeks or so.  Patient was also found to have some purulent drainage from the chronic wounds.  Patient unfortunately states that he had to do the dressing changes himself.  His mother was placed his mother passed away.  Meanwhile patient has now become homeless.  He did have leukocytosis of 13,000 and arrival.  It was about 2.  He had a grossly positive urine analysis.  The CAT scan was consistent with osteomyelitis of the back.  He was placed on IV cefepime , metronidazole  and linezolid .  Previous history of VRE infection present.  ID consult has been called for further evaluation.   Review of Systems: Review of Systems  Constitutional:  Positive for fever and malaise/fatigue.  Eyes: Negative.   Respiratory: Negative.    Cardiovascular: Negative.   Gastrointestinal: Negative.    Genitourinary: Negative.   Musculoskeletal: Negative.   Skin:        Multiple wounds present.  Neurological:        Patient is paraplegic.  Endo/Heme/Allergies: Negative.     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 01/24/2017   Osteomyelitis (HCC)    left hip   Paraplegia (HCC)    secondary to Spina Bifida   Polymicrobial bacteremia with sepsis:    Pressure injury of skin of left buttock 05/13/2021   PVCs (premature ventricular contractions)    S/P debridement 10/30/2021   Septic shock (HCC)    due to osteomyelitis   Slow transit constipation 03/06/2017   Spina bifida    Status post debridement 01/24/2017   Wheelchair bound     Social History   Tobacco Use   Smoking status: Former    Types: Cigarettes    Passive exposure: Past   Smokeless tobacco: Never  Vaping Use   Vaping status: Never Used  Substance Use Topics   Alcohol use: No   Drug use: Not Currently    Family History  Problem Relation Age of Onset   CAD Father        died age 75 from MI   Sudden Cardiac Death Sister        age 43   CAD Maternal Grandfather        died age 51 MI   Allergies  Allergen Reactions   Firvanq [Vancomycin] Itching    Redman's Syndrome    OBJECTIVE: Blood pressure 108/63, pulse 74, temperature 98.1 F (36.7 C), temperature source Oral, resp. rate 18, height 5' 4 (1.626 m), weight 77.7 kg, SpO2 97%.  Physical Exam Constitutional:      Appearance: He is ill-appearing.  HENT:     Head: Normocephalic and atraumatic.     Comments: Poor dentition present.    Nose: Nose normal.     Mouth/Throat:     Mouth: Mucous membranes are moist.  Eyes:      Pupils: Pupils are equal, round, and reactive to light.  Cardiovascular:     Rate and Rhythm: Normal rate and regular rhythm.     Pulses: Normal pulses.     Heart sounds: Normal heart sounds.  Pulmonary:     Effort: Pulmonary effort is normal.     Breath sounds: Normal breath sounds.  Abdominal:     General: Bowel sounds are normal.     Comments: Colostomy bag is functioning with semiformed stools.  Musculoskeletal:     Cervical back: Normal range of motion.     Comments: Patient has decub in the right heel.  Skin:    Findings: Lesion present.     Comments: Patient has  deep-seated wounds present in the back.  The dressings are already soaked.  Neurological:     Mental Status: He is alert and oriented to person, place, and time. Mental status is at baseline.  Psychiatric:        Mood and Affect: Mood normal.        Behavior: Behavior normal.        Thought Content: Thought content normal.        Judgment: Judgment normal.     Lab Results Lab Results  Component Value Date   WBC 7.3 10/27/2023   HGB 9.5 (L) 10/27/2023   HCT 32.2 (L) 10/27/2023   MCV 74.2 (L) 10/27/2023   PLT 546 (H) 10/27/2023    Lab Results  Component Value Date   CREATININE 0.60 (L) 10/27/2023   BUN 9 10/27/2023   NA 138 10/27/2023   K 3.6 10/27/2023   CL 105 10/27/2023   CO2 24 10/27/2023    Lab Results  Component Value Date   ALT 11 10/23/2023   AST 13 (L) 10/23/2023   ALKPHOS 95 10/23/2023   BILITOT 0.5 10/23/2023     Microbiology: Recent Results (from the past 240 hours)  Blood culture (routine x 2)     Status: None (Preliminary result)   Collection Time: 10/23/23  9:18 PM   Specimen: BLOOD  Result Value Ref Range Status   Specimen Description BLOOD RIGHT ANTECUBITAL  Final   Special Requests   Final    BOTTLES DRAWN AEROBIC AND ANAEROBIC Blood Culture adequate volume   Culture   Final    NO GROWTH 4 DAYS Performed at [facility], [address], [place], [place] [address]     Report Status PENDING  Incomplete  Blood culture (routine x 2)     Status: None (Preliminary result)   Collection Time: 10/23/23  9:21 PM   Specimen: BLOOD  Result Value Ref Range Status   Specimen Description BLOOD LEFT ANTECUBITAL  Final   Special Requests   Final    BOTTLES DRAWN AEROBIC AND ANAEROBIC Blood Culture adequate volume   Culture   Final    NO GROWTH 4 DAYS Performed at [facility], [address] [place], [place] [address]    Report Status PENDING  Incomplete  Resp panel by RT-PCR (RSV, Flu A&B, Covid) Anterior Nasal Swab     Status: None   Collection Time: 10/23/23  9:26 PM   Specimen: Anterior Nasal Swab  Result Value Ref Range Status   SARS Coronavirus 2 by RT PCR NEGATIVE NEGATIVE Final   Influenza A by PCR NEGATIVE NEGATIVE Final   Influenza B by PCR NEGATIVE NEGATIVE Final    Comment: (NOTE) The Xpert Xpress SARS-CoV-2/FLU/RSV plus assay is intended as an aid in the diagnosis of influenza from Nasopharyngeal swab specimens and should not be used as a sole basis for treatment. Nasal washings and aspirates are unacceptable for Xpert Xpress SARS-CoV-2/FLU/RSV testing.  Fact Sheet for Patients: BloggerCourse.com  Fact Sheet for Healthcare Providers: SeriousBroker.it  This test is not yet approved or cleared by the [place]  FDA and has been authorized for detection and/or diagnosis of SARS-CoV-2 by FDA under an Emergency Use Authorization (EUA). This EUA will remain in effect (meaning this test can be used) for the duration of the COVID-19 declaration under Section 564(b)(1) of the Act, 21 U.S.C. section 360bbb-3(b)(1), unless the authorization is terminated or revoked.     Resp Syncytial Virus by PCR NEGATIVE NEGATIVE Final  Comment: (NOTE) Fact Sheet for Patients: BloggerCourse.com  Fact Sheet for Healthcare  Providers: SeriousBroker.it  This test is not yet approved or cleared by the [place]  FDA and has been authorized for detection and/or diagnosis of SARS-CoV-2 by FDA under an Emergency Use Authorization (EUA). This EUA will remain in effect (meaning this test can be used) for the duration of the COVID-19 declaration under Section 564(b)(1) of the Act, 21 U.S.C. section 360bbb-3(b)(1), unless the authorization is terminated or revoked.  Performed at [facility], [address], [place], [place] [address]   MRSA Next Gen by PCR, Nasal     Status: None   Collection Time: 10/24/23  1:26 PM   Specimen: Nasal Mucosa; Nasal Swab  Result Value Ref Range Status   MRSA by PCR Next Gen NOT DETECTED NOT DETECTED Final    Comment: (NOTE) The GeneXpert MRSA Assay (FDA approved for NASAL specimens only), is one component of a comprehensive MRSA colonization surveillance program. It is not intended to diagnose MRSA infection nor to guide or monitor treatment for MRSA infections. Test performance is not FDA approved in patients less than 47 years old. Performed at [facility], [address] [place], [place] [address]    CT L-SPINE NO CHARGE Result Date: 10/23/2023 CLINICAL DATA:  Sepsis EXAM: CT Lumbar Spine without contrast TECHNIQUE: Technique: Multiplanar CT images of the lumbar spine were reconstructed from contemporary CT of the Abdomen and Pelvis. RADIATION DOSE REDUCTION: This exam was performed according to the departmental dose-optimization program which includes automated exposure control, adjustment of the mA and/or kV according to patient size and/or use of iterative reconstruction technique. CONTRAST:  None or No additional COMPARISON:  CT 09/06/2022, 07/25/2022, 09/09/2021 FINDINGS: Segmentation: 5 lumbar type vertebrae. Alignment: Exaggerated lumbar lordosis. Vertebrae: Normal vertebral body height. No fracture. No osseous destructive change.  Paraspinal and other soft tissues: See separately dictated abdominopelvic CT. Advanced fatty atrophy of the paraspinal muscles. Disc levels: Congenitally short appearing pedicles with multilevel moderate severe canal stenosis at T12-L1, L1-L2, L2-L3. Multilevel hypertrophic facet degenerative changes. At least moderate bilateral foraminal narrowing at L3-L4 and L4-L5. Chronic osseous deformity of the left posterior arch of L2 and L3. Posterior arch defects at L4 and L5 with chronic soft tissue density containing calcifications in the midline posterior soft tissues measuring 7 x 4.5 cm and extending to the dorsal canal. This is a chronic finding. No obvious rim enhancing fluid collections. IMPRESSION: 1. No CT evidence for acute osseous abnormality. 2. Congenitally short appearing pedicles with multilevel moderate to severe canal stenosis. 3. Posterior arch defects at L4 and L5 with chronic soft tissue density containing calcifications in the midline posterior soft tissues extending from the skin surface to the dorsal canal. This is a chronic finding. Electronically Signed   By: Luke Bun M.D.   On: 10/23/2023 23:51   CT ABDOMEN PELVIS W CONTRAST Result Date: 10/23/2023 CLINICAL DATA:  Sepsis EXAM: CT ABDOMEN AND PELVIS WITH CONTRAST TECHNIQUE: Multidetector CT imaging of the abdomen and pelvis was performed using the standard protocol following bolus administration of intravenous contrast. RADIATION DOSE REDUCTION: This exam was performed according to the departmental dose-optimization program which includes automated exposure control, adjustment of the mA and/or kV according to patient size and/or use of iterative reconstruction technique. CONTRAST:  75mL OMNIPAQUE  IOHEXOL  350 MG/ML SOLN COMPARISON:  CT 09/06/2022, 02/24/2023, MRI 09/07/2022, CT 07/25/2022, 09/09/2021 FINDINGS: Lower chest: Lung bases demonstrate no acute airspace disease. Hepatobiliary: No calcified gallstone. Interval  11 mm hypodensity in  the posterior right hepatic lobe, series 3 image 29, indeterminate in density. No biliary dilatation Pancreas: Unremarkable. No pancreatic ductal dilatation or surrounding inflammatory changes. Spleen: Enlarged measuring 15 cm AP. Adrenals/Urinary Tract: Adrenal glands are normal. Prominent renal pelvises as before. No hydroureter. Suprapubic catheter in the bladder. At least 3 large bladder stones measuring up to 4.3 cm. Stomach/Bowel: The stomach is nonenlarged. There is no dilated small bowel. Abnormal duodenal jejunal junction which does not cross midline and consistent with malrotation spectrum. There is no evidence for a bowel obstruction. Left abdominal colostomy with small parastomal hernia containing fat and small bowel but no obstruction. Negative appendix Vascular/Lymphatic: Mild aortic atherosclerosis. No aneurysm. Mildly prominent retroperitoneal nodes with 12 mm node at the aortic bifurcation. Chronic enlargement of iliac nodes, for example right external iliac node measuring 17 mm. Reproductive: Negative prostate except for calcification Other: No ascites or free air moderate fat containing right inguinal hernia Musculoskeletal: Chronic soft tissue mass with calcification at the midline posterior paraspinal soft tissues. Diffuse paraspinal muscular atrophy. Redemonstrated large/deep decubitus ulceration. Skin breakdown and soft tissue loss over the ischium, hips, sacrococcygeal spine and perineum with ulceration is again extending down to the base of the penis. Sclerosis and erosive changes of the ischium, grossly similar. Chronic erosions, resorption and osseous destructive change of the bilateral acetabula and proximal femurs. Ulceration and gas extending into the bilateral hip joints as before but less gas in the joint space compared to prior. Slight progressive resorption of multiple osseous fragments involving proximal right femur compared to prior. Small rim enhancing fluid collection at the  right posterior hip measuring 3.7 x 1.9 cm on series 3, image 84. Grossly similar sacrococcygeal mild erosive change. Overall less gas and soft tissue change at the bilateral hip joints compared to the prior pelvic CT. IMPRESSION: 1. No CT evidence for acute intra-abdominal or pelvic abnormality. 2. Redemonstrated extensive decubitus ulcers with chronic fragmentation and osseous destructive change involving the bilateral hip joints. Some progressive resorption of right proximal femoral bone fragments compared to the prior pelvic CT but in general, overall less soft tissue thickening and gas within the joint spaces compared to the prior CT. Small 3.7 cm rim enhancing collection at the posterior right hip may reflect small inflammatory collection or abscess. Previously described sclerosis and osseous erosive change in deformity of the pubic bones, ischium, and sacrococcygeal region are otherwise grossly unchanged. 3. Interval 11 mm hypodensity in the posterior right hepatic lobe, indeterminate in density. Nonemergent MRI evaluation may be obtained if felt clinically appropriate. 4. Splenomegaly. 5. Left abdominal colostomy with small parastomal hernia containing fat and small bowel but no obstruction. 6. Suprapubic catheter in the bladder. At least 3 large bladder stones measuring up to 4.3 cm. Aortic Atherosclerosis (ICD10-I70.0). Electronically Signed   By: Luke Bun M.D.   On: 10/23/2023 23:33   DG Chest 2 View Result Date: 10/23/2023 CLINICAL DATA:  [id number] Fever [id number] EXAM: CHEST - 2 VIEW COMPARISON:  03/01/2023 FINDINGS: Elevation of the right hemidiaphragm. No focal airspace consolidation, pleural effusion, or pneumothorax. No cardiomegaly.No acute fracture or destructive lesion. Multilevel thoracic osteophytosis. IMPRESSION: No acute cardiopulmonary abnormality. Electronically Signed   By: Rogelia Myers M.D.   On: 10/23/2023 20:30    Katharine Metro, MD [facility] Cell phone : [phone]  10/27/2023 2:09 PM    Total Encounter Time: 80 min

## 2023-10-27 NOTE — Plan of Care (Signed)
  Problem: Education: Goal: Knowledge of General Education information will improve Description: Including pain rating scale, medication(s)/side effects and non-pharmacologic comfort measures Outcome: Progressing   Problem: Health Behavior/Discharge Planning: Goal: Ability to manage health-related needs will improve Outcome: Progressing   Problem: Clinical Measurements: Goal: Ability to maintain clinical measurements within normal limits will improve Outcome: Progressing Goal: Will remain free from infection Outcome: Not Progressing Goal: Diagnostic test results will improve Outcome: Progressing Goal: Respiratory complications will improve Outcome: Progressing Goal: Cardiovascular complication will be avoided Outcome: Progressing   Problem: Activity: Goal: Risk for activity intolerance will decrease Outcome: Progressing   Problem: Nutrition: Goal: Adequate nutrition will be maintained Outcome: Progressing   Problem: Coping: Goal: Level of anxiety will decrease Outcome: Progressing   Problem: Elimination: Goal: Will not experience complications related to bowel motility Outcome: Progressing Goal: Will not experience complications related to urinary retention Outcome: Progressing   Problem: Pain Managment: Goal: General experience of comfort will improve and/or be controlled Outcome: Progressing   Problem: Safety: Goal: Ability to remain free from injury will improve Outcome: Progressing   Problem: Skin Integrity: Goal: Risk for impaired skin integrity will decrease Outcome: Not Progressing

## 2023-10-27 NOTE — Assessment & Plan Note (Addendum)
 Labwork stable this morning, remains afebrile. Pending orthopedic surgery evaluation. Blood cultures no growth 2 days.  - Ortho consulted, plans to see today, appreciate recommendations   - No intervention at this time - ID consulted, appreciate recommendations  - RD consulted, appreciate recommendation  - Continue Cefepime  q8h, Linezolid  q12h, Metronidazole  q12h (10/3-) narrow as able - Tylenol  650mg  q6h PRN - Consider IR consults if worsening/pending culture results - PT/OT eval and treat - am CBC/BMP

## 2023-10-27 NOTE — Assessment & Plan Note (Signed)
 Small puncture wound from incident with pruning shears at home. Received Tdap.

## 2023-10-27 NOTE — Assessment & Plan Note (Signed)
 Iron  deficiency - continue home ferrous sulfate 325 mg daily PVCs - continue home Flecanide 100 mg BID HTN - continue home lisinopril -hydrochlorothiazide  20-25 mg and metoprolol  succinate 50 mg daily HLD - continue home rosuvastatin  40 mg daily  Suprapubic catheter - stable Colostomy - stable

## 2023-10-27 NOTE — Progress Notes (Signed)
     Daily Progress Note Intern Pager: [phone]  Patient name: Anthony [name] Medical record number: [id number] Date of birth: 1970-09-25 Age: 53 y.o. Gender: male  Primary Care Provider: Jerrie Gathers, DO Consultants: Orthopedic surgery  Code Status: Full  Pt Overview and Major Events to Date:  10/3: Admitted   Medical Decision Making:  Anthony [name] is a 53 y.o. male with a PMH of paraplegia 2/2 spina bifida, chronic sacral ulcers, HTN, HLD, R rotator cuff tear who was admitted for sepsis suspected in setting of extensive pressure injuries. Continuing IV antibiotics, will obtain wound cultures today. Assessment & Plan Sepsis (HCC) Unstageable pressure ulcer of sacral region (HCC) Labwork stable this morning, remains afebrile. Pending orthopedic surgery evaluation. Blood cultures no growth 2 days.  - Ortho consulted, plans to see today, appreciate recommendations   - No intervention at this time - ID consulted, appreciate recommendations  - RD consulted, appreciate recommendation  - Continue Cefepime  q8h, Linezolid  q12h, Metronidazole  q12h (10/3-) narrow as able - Tylenol  650mg  q6h PRN - Consider IR consults if worsening/pending culture results - PT/OT eval and treat - am CBC/BMP Injury of right thumb Small puncture wound from incident with pruning shears at home. Received Tdap.  Chronic health problem Iron  deficiency - continue home ferrous sulfate 325 mg daily PVCs - continue home Flecanide 100 mg BID HTN - continue home lisinopril -hydrochlorothiazide  20-25 mg and metoprolol  succinate 50 mg daily HLD - continue home rosuvastatin  40 mg daily  Suprapubic catheter - stable Colostomy - stable  FEN/GI: Regular diet  PPx: Lovenox   Dispo: SNF pending clinical improvement.   Subjective:  Patient seen lying comfortably in bed. States he has right shoulder pain, but only when lying on that side. Denies any other pain or concerns at this time.   Objective: Temp:  [97.8 F  (36.6 C)-98.8 F (37.1 C)] 97.8 F (36.6 C) (10/07 0447) Pulse Rate:  [77-82] 77 (10/07 0447) Resp:  [14-20] 19 (10/07 0447) BP: (113-121)/(63-80) 116/63 (10/07 0447) SpO2:  [94 %-95 %] 94 % (10/07 0447) Physical Exam: General: NAD Cardiovascular: RRR, no m/r/g Respiratory: CTAB, normal work of breathing on room air  Abdomen: soft, non-distended, non-tender to palpation  Extremities: thin, no edema, bandages over bilateral heels   Laboratory: Most recent CBC Lab Results  Component Value Date   WBC 7.3 10/27/2023   HGB 9.5 (L) 10/27/2023   HCT 32.2 (L) 10/27/2023   MCV 74.2 (L) 10/27/2023   PLT 546 (H) 10/27/2023   Most recent BMP    Latest Ref Rng & Units 10/27/2023    3:44 AM  BMP  Glucose 70 - 99 mg/dL 897   BUN 6 - 20 mg/dL 9   Creatinine 9.38 - 8.75 mg/dL 9.39   Sodium 864 - 854 mmol/L 138   Potassium 3.5 - 5.1 mmol/L 3.6   Chloride 98 - 111 mmol/L 105   CO2 22 - 32 mmol/L 24   Calcium  8.9 - 10.3 mg/dL 8.5    Imaging/Diagnostic Tests: No new imaging.  [name], DO 10/27/2023, 7:25 AM  PGY-1, [facility] FPTS Intern pager: [phone], text pages welcome Secure chat group [facility] Teaching Service

## 2023-10-27 NOTE — Consult Note (Signed)
 ORTHOPAEDIC CONSULTATION  REQUESTING PHYSICIAN: Madelon Kym HERO, DO  Chief Complaint: Chronic sacral and ischial and hip ulcers.  HPI: Anthony [name] is a 53 y.o. male who presents with paralysis secondary to spina bifida.  He has had a history of chronic osteomyelitis of the pelvis and hips.  Patient's last follow-up in the office was December 2024 and patient states that he was using Dakin solution and had obtained an air mattress.  Patient was to follow-up in 4 weeks.  The sacral ulcers were improving at last evaluation.  The wounds had flat healthy granulation tissue and were healing well.  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 01/24/2017   Osteomyelitis (HCC)    left hip   Paraplegia (HCC)    secondary to Spina Bifida   Polymicrobial bacteremia with sepsis:    Pressure injury of skin of left buttock 05/13/2021   PVCs (premature ventricular contractions)    S/P debridement 10/30/2021   Septic shock (HCC)    due to osteomyelitis   Slow transit constipation 03/06/2017   Spina bifida    Status post debridement 01/24/2017   Wheelchair bound    Past Surgical History:  Procedure Laterality Date   APPLICATION OF WOUND VAC  09/11/2021   Procedure: APPLICATION OF WOUND VAC;  Surgeon: Harden Jerona GAILS, MD;  Location: [facility] OR;  Service: Orthopedics;;   BACK SURGERY     BUBBLE STUDY  09/09/2021   Procedure: BUBBLE STUDY;  Surgeon: Shlomo Wilbert SAUNDERS, MD;  Location: [facility] ENDOSCOPY;  Service: Cardiovascular;;   HIP SURGERY     I & D EXTREMITY Left 09/11/2021   Procedure: LEFT HIP DEBRIDEMENT AND REMOVAL FEMORAL HEAD;  Surgeon: Harden Jerona GAILS, MD;  Location: [facility] OR;   Service: Orthopedics;  Laterality: Left;   I & D EXTREMITY Left 09/13/2021   Procedure: DEBRIDEMENT LEFT HIP;  Surgeon: Harden Jerona GAILS, MD;  Location: [facility] OR;  Service: Orthopedics;  Laterality: Left;   I & D EXTREMITY Left 09/18/2021   Procedure: DEBRIDEMENT LEFT HIP, WOUND VAC EXCHANGE;  Surgeon: Harden Jerona GAILS, MD;  Location: [facility] OR;  Service: Orthopedics;  Laterality: Left;   I & D EXTREMITY Left 10/30/2021   Procedure: LEFT HIP DEBRIDEMENT WITH KERECIS PLACEMENT;  Surgeon: Harden Jerona GAILS, MD;  Location: [facility] OR;  Service: Orthopedics;  Laterality: Left;   I & D EXTREMITY Bilateral 08/20/2022   Procedure: BILATERAL HIP DEBRIDEMENT;  Surgeon: Harden Jerona GAILS, MD;  Location: [facility] OR;  Service: Orthopedics;  Laterality: Bilateral;   INCISION AND DRAINAGE OF WOUND N/A 01/24/2017   Procedure: IRRIGATION AND DEBRIDEMENT WOUND- BUTTOCK ABSCESS;  Surgeon: Aron Shoulders, MD;  Location: [facility] ORS;  Service: General;  Laterality: N/A;   LAPAROSCOPIC LOOP COLOSTOMY N/A 08/12/2022   Procedure: LAPAROSCOPIC LOOP SIGMOID COLOSTOMY;  Surgeon: Ebbie Cough, MD;  Location[facility] OR;  Service: General;  Laterality: N/A;   [name] WITHOUT CARDIOVERSION N/A 09/09/2021   Procedure: TRANSESOPHAGEAL ECHOCARDIOGRAM ([name]);  Surgeon: Shlomo Wilbert SAUNDERS, MD;  Location: [facility] ENDOSCOPY;  Service: Cardiovascular;  Laterality: N/A;   [name] WITHOUT CARDIOVERSION N/A 08/04/2022   Procedure: TRANSESOPHAGEAL ECHOCARDIOGRAM;  Surgeon: Mona Vinie BROCKS, MD;  Location: [facility] INVASIVE CV LAB;  Service: Cardiovascular;  Laterality: N/A;   Social History   Socioeconomic History   Marital status: Single    Spouse name: Not on file   Number of children: 0   Years of education: 35   Highest education level: 12th grade  Occupational History   Not on file  Tobacco Use   Smoking status: Former    Types: Cigarettes    Passive exposure: Past   Smokeless tobacco: Never  Vaping Use   Vaping status: Never Used  Substance and Sexual Activity   Alcohol use:  No   Drug use: Not Currently   Sexual activity: Not Currently  Other Topics Concern   Not on file  Social History Narrative   Not on file   Social Drivers of Health   Financial Resource Strain: Low Risk  (10/15/2022)   Overall Financial Resource Strain (CARDIA)    Difficulty of Paying Living Expenses: Not very hard  Food Insecurity: No Food Insecurity (10/24/2023)   Hunger Vital Sign    Worried About Running Out of Food in the Last Year: Never true    Ran Out of Food in the Last Year: Never true  Transportation Needs: Unmet Transportation Needs (10/24/2023)   PRAPARE - Transportation    Lack of Transportation (Medical): Yes    Lack of Transportation (Non-Medical): Yes  Physical Activity: Inactive (10/15/2022)   Exercise Vital Sign    Days of Exercise per Week: 0 days    Minutes of Exercise per Session: 0 min  Stress: No Stress Concern Present (10/15/2022)   [employer] of Occupational Health - Occupational Stress Questionnaire    Feeling of Stress : Only a little  Social Connections: Moderately Integrated (10/15/2022)   Social Connection and Isolation Panel    Frequency of Communication with Friends and Family: More than three times a week    Frequency of Social Gatherings with Friends and Family: More than three times a week    Attends Religious Services: More than 4 times per year    Active Member of [employer] or Organizations: Yes    Attends Engineer, structural: More than 4 times per year    Marital Status: Never married   Family History  Problem Relation Age of Onset   CAD Father        died age 9 from MI   Sudden Cardiac Death Sister        age 53   CAD Maternal Grandfather        died age 31 MI   - negative except otherwise stated in the family history section Allergies  Allergen Reactions   Firvanq [Vancomycin] Itching    Redman's Syndrome   Prior to Admission medications   Medication Sig Start Date End Date Taking? Authorizing Provider   acetaminophen  (TYLENOL ) 500 MG tablet Take 1,000 mg by mouth every 6 (six) hours as needed for moderate pain, headache or fever.   Yes [provider]  ascorbic acid  (VITAMIN C ) 500 MG tablet Take 1 tablet (500 mg total) by mouth 2 (two) times daily. Patient taking differently: Take 500 mg by mouth daily. 09/01/22  Yes Nicholas Bar, MD  doxylamine, Sleep, (SLEEP AID, DOXYLAMINE,) 25 MG tablet Take 25 mg by mouth at bedtime as needed for sleep.   Yes [provider]  Ferrous Sulfate (IRON ) 325 (65 Fe) MG TABS Take 1 tablet by mouth daily. 04/07/23  Yes [provider]  flecainide  (TAMBOCOR ) 100 MG tablet Take 1 tablet (100 mg total) by mouth 2 (two) times daily. 12/31/22  Yes Ollis, Daphne CROME, NP  ibuprofen  (ADVIL ) 200 MG tablet Take 400 mg by mouth every 6 (six) hours as needed for headache or moderate pain (pain score 4-6).   Yes [provider]  lisinopril -hydrochlorothiazide  (ZESTORETIC ) 20-25 MG tablet Take 1 tablet by mouth daily. 04/07/23  Yes Shlomo Wilbert SAUNDERS, MD  metoprolol  succinate (TOPROL -XL) 50 MG 24 hr tablet Take with or immediately following a meal. 08/03/23  Yes Turner, Wilbert SAUNDERS, MD  Multiple Vitamin (MULTIVITAMIN WITH MINERALS) TABS tablet Take 1 tablet by mouth daily.   Yes [provider]  Potassium 99 MG TABS Take 1 tablet by mouth 2 (two) times daily. 04/07/23  Yes [provider]  rosuvastatin  (CRESTOR ) 40 MG tablet TAKE 1 TABLET BY MOUTH ONCE DAILY . APPOINTMENT REQUIRED FOR FUTURE REFILLS 07/15/23  Yes Turner, Wilbert SAUNDERS, MD  nitroGLYCERIN  (NITROSTAT ) 0.4 MG SL tablet Place 1 tablet (0.4 mg total) under the tongue every 5 (five) minutes as needed for chest pain. Patient not taking: Reported on 10/24/2023 01/10/22   Lucien Orren SAILOR, PA-C   No results found. - pertinent xrays, CT, MRI studies were reviewed and independently interpreted  Positive ROS: All other systems have been reviewed and were otherwise negative with the exception  of those mentioned in the HPI and as above.  Physical Exam: General: Alert, no acute distress Psychiatric: Patient is competent for consent with normal mood and affect Lymphatic: No axillary or cervical lymphadenopathy Cardiovascular: No pedal edema Respiratory: No cyanosis, no use of accessory musculature GI: No organomegaly, abdomen is soft and non-tender    Images:  @ENCIMAGES @  Labs:  Lab Results  Component Value Date   ESRSEDRATE 120 (H) 09/06/2022   ESRSEDRATE 140 (H) 08/06/2022   CRP 20.2 (H) 09/06/2022   CRP 16.4 (H) 08/06/2022   REPTSTATUS PENDING 10/23/2023   GRAMSTAIN  09/06/2022    FEW WBC PRESENT, PREDOMINANTLY MONONUCLEAR FEW GRAM POSITIVE COCCI IN PAIRS FEW GRAM NEGATIVE RODS Performed at [facility], [address], [place], [place] [address]    CULT  10/23/2023    NO GROWTH 3 DAYS Performed at [facility], [address], [place], [place] [address]    [facility] ENTEROCOCCUS GEORGIAN 08/20/2022    Lab Results  Component Value Date   ALBUMIN  2.6 (L) 10/23/2023   ALBUMIN  2.4 (L) 02/24/2023   ALBUMIN  2.1 (L) 09/11/2022        Latest Ref Rng & Units 10/27/2023    3:44 AM 10/26/2023    4:53 AM 10/25/2023    2:32 AM  CBC EXTENDED  WBC 4.0 - 10.5 K/uL 7.3  6.2  7.8   RBC 4.22 - 5.81 MIL/uL 4.34  4.07  3.83   Hemoglobin 13.0 - 17.0 g/dL 9.5  9.0  8.6   HCT 60.9 - 52.0 % 32.2  30.2  28.3   Platelets 150 - 400 K/uL 546  506  467     Neurologic: Patient does not have protective sensation bilateral lower extremities.   MUSCULOSKELETAL:   Skin: Examination this time the patient's sacral and hip ulcers are much larger.  Review of the CT scan shows chronic osteomyelitis with decreased air in the soft tissue.  Patient's white cell count is 7.3 with a hemoglobin of 9.5.  Albumin  2.6.  Previous wound cultures were positive for enterococci's.  Assessment: Assessment: Chronic sacral and hip ulcers with  chronic osteomyelitis of the pelvis and  hips.  Plan: Plan: The bony changes appear stable.  The ulcers are larger.  Anticipate discharge to skilled nursing, air mattress for pressure offloading, daily Vashe dressing changes, continue antibiotics to cover for GI bacteria, nutritional support.  I will follow-up in the office in 2 weeks.  Thank you for the consult and the opportunity to see Mr. Jessi Jessop, MD [facility] [phone] 7:40 AM

## 2023-10-28 DIAGNOSIS — G8220 Paraplegia, unspecified: Secondary | ICD-10-CM

## 2023-10-28 DIAGNOSIS — Z9889 Other specified postprocedural states: Secondary | ICD-10-CM | POA: Diagnosis not present

## 2023-10-28 DIAGNOSIS — A419 Sepsis, unspecified organism: Secondary | ICD-10-CM | POA: Diagnosis not present

## 2023-10-28 DIAGNOSIS — L89150 Pressure ulcer of sacral region, unstageable: Secondary | ICD-10-CM | POA: Diagnosis not present

## 2023-10-28 LAB — CULTURE, BLOOD (ROUTINE X 2)
Culture: NO GROWTH
Culture: NO GROWTH
Special Requests: ADEQUATE
Special Requests: ADEQUATE

## 2023-10-28 MED ORDER — ENSURE PLUS HIGH PROTEIN PO LIQD
237.0000 mL | Freq: Two times a day (BID) | ORAL | Status: DC
  Administered 2023-10-29 – 2023-11-02 (×10): 237 mL via ORAL

## 2023-10-28 MED ORDER — ADULT MULTIVITAMIN W/MINERALS CH
1.0000 | ORAL_TABLET | Freq: Every day | ORAL | Status: DC
  Administered 2023-10-28 – 2023-11-02 (×6): 1 via ORAL
  Filled 2023-10-28 (×6): qty 1

## 2023-10-28 MED ORDER — JUVEN PO PACK
1.0000 | PACK | Freq: Two times a day (BID) | ORAL | Status: DC
Start: 2023-10-28 — End: 2023-11-03
  Administered 2023-10-28 – 2023-11-02 (×10): 1 via ORAL
  Filled 2023-10-28 (×10): qty 1

## 2023-10-28 NOTE — Assessment & Plan Note (Signed)
 Small puncture wound from incident with pruning shears at home. Received Tdap.

## 2023-10-28 NOTE — Assessment & Plan Note (Addendum)
 Hypotensive overnight to 94/5, improving. Wound cultures collected yesterday.  - Ortho consulted, plans to see today, appreciate recommendations   - No intervention at this time - ID consulted, appreciate recommendations   - CRP, ESR in am  - wound culture: no WBC, no organisms seen, NG <24 hours  - RD consulted, appreciate recommendation  - Continue Cefepime  q8h, Linezolid  q12h, Metronidazole  q12h (10/3-) narrow as able - Tylenol  650mg  q6h PRN - Consider IR consults if worsening/pending culture results - PT/OT eval and treat - am CBC

## 2023-10-28 NOTE — Progress Notes (Signed)
 Physical Therapy Treatment and D/C Patient Details Name: Anthony [name] MRN: [id number] DOB: 10/11/1970 Today's Date: 10/28/2023   History of Present Illness Pt is a 53 y.o. male admitted 10/3 for pressure wound, found to be septic.PMH: spina bifida, recurrent decubitus ulcer, septic arthritis of bilateral hips, chronic osteomyelitis, DVT, UTI    PT Comments  Pt admitted with above diagnosis. Pt was able to transfer laterally to wheelchair without assist by PT and was lifting buttocks fully to prevent shearing. Pt appears at his baseline and will not need further PT at this time. Mobility specialists can follow while pt in hospital if pt gets up.  Pt reports he moves in bed on his own and has not issues currently with his normal care for himself. Will sign off.     If plan is discharge home, recommend the following: Assist for transportation;Assistance with cooking/housework   Can travel by private vehicle     No  Equipment Recommendations  Wheelchair cushion (measurements PT);Wheelchair (measurements PT)    Recommendations for Other Services       Precautions / Restrictions Precautions Precautions: Fall Recall of Precautions/Restrictions: Intact Restrictions Weight Bearing Restrictions Per Provider Order: No     Mobility  Bed Mobility Overal bed mobility: Needs Assistance Bed Mobility: Supine to Sit, Sit to Supine, Rolling Rolling: Independent   Supine to sit: Independent Sit to supine: Independent   General bed mobility comments: Manages LE ind, assist with lines    Transfers Overall transfer level: Needs assistance   Transfers: Bed to chair/wheelchair/BSC            Lateral/Scoot Transfers: Modified independent (Device/Increase time) General transfer comment: Pt able to lock chair in place.  Pt transfers laterally and leaves LEs on bed as he lifts bottocks fully to get into chair.  Pt then places bil LEs on bed and laterally scoots back to bed. Did not need any  assist for this.    Ambulation/Gait                   Stairs             Wheelchair Mobility     Tilt Bed    Modified Rankin (Stroke Patients Only)       Balance Overall balance assessment: No apparent balance deficits (not formally assessed)                                          Communication Communication Communication: No apparent difficulties  Cognition Arousal: Alert Behavior During Therapy: WFL for tasks assessed/performed   PT - Cognitive impairments: No apparent impairments                         Following commands: Intact      Cueing Cueing Techniques: Verbal cues  Exercises      General Comments General comments (skin integrity, edema, etc.): Bil sacral wounds      Pertinent Vitals/Pain Pain Assessment Pain Assessment: No/denies pain    Home Living                          Prior Function            PT Goals (current goals can now be found in the care plan section) Acute Rehab PT Goals Patient Stated Goal: to get  stronger and for wounds to heal as much as they can. PT Goal Formulation: All assessment and education complete, DC therapy Progress towards PT goals: Goals met/education completed, patient discharged from PT    Frequency           PT Plan      Co-evaluation              AM-PAC PT 6 Clicks Mobility   Outcome Measure  Help needed turning from your back to your side while in a flat bed without using bedrails?: A Little Help needed moving from lying on your back to sitting on the side of a flat bed without using bedrails?: A Little Help needed moving to and from a bed to a chair (including a wheelchair)?: A Little Help needed standing up from a chair using your arms (e.g., wheelchair or bedside chair)?: Total Help needed to walk in hospital room?: Total Help needed climbing 3-5 steps with a railing? : Total 6 Click Score: 12    End of Session   Activity  Tolerance: Patient tolerated treatment well Patient left: in bed;with call bell/phone within reach Nurse Communication: Mobility status PT Visit Diagnosis: Other abnormalities of gait and mobility (R26.89)     Time: 1030-1044 PT Time Calculation (min) (ACUTE ONLY): 14 min  Charges:    $Therapeutic Activity: 8-22 mins PT General Charges $$ ACUTE PT VISIT: 1 Visit                     [facility] M,PT Acute Rehab Services [phone]    Stephane JULIANNA Bevel 10/28/2023, 1:31 PM

## 2023-10-28 NOTE — Progress Notes (Signed)
 Initial Nutrition Assessment  DOCUMENTATION CODES:   Not applicable  INTERVENTION:   Add Ensure Max po BID, each supplement provides 150 kcal and 30 grams of protein.    Recommend wound healing lab panel given chronic, long-term sacral wound: vitamin A , D, C, zinc , and CRP   Continue regular//liberalized diet    Add MVI w/ minerals   NUTRITION DIAGNOSIS:  Increased nutrient needs related to wound healing as evidenced by estimated needs.  GOAL:  Patient will meet greater than or equal to 90% of their needs  MONITOR:  PO intake, Supplement acceptance, Skin  REASON FOR ASSESSMENT:  Consult Assessment of nutrition requirement/status, Wound healing  ASSESSMENT:  Pt with PMH significant for: spina bifida, paraplegia, HTN, chronic sacral wound, chronic indwelling Foley, diverting colostomy, and b/l hip septic arthritis. Admitted for sepsis of his chronic sacral wound.  Sepsis now resolved. Pending SNF placement as he has no housing to which to discharge. Will require PICC and ABX for 6-8 weeks as well.    Average Meal Intake 10/5: 95% x1 documented meal 10/6: 80-100% x2 documented meals 10/7: 80-100% x2 documented meals  Met with patient at bedside who is pleasant and conversant. Lunch meal being taken away. Ate 100% of his spaghetti, but none of his broccoli or side salad. States he is having his meal order taken. No difficulties with chewing or swallowing.  Reports his mother used to do all of the cooking. She died one year ago, which is when his progressive decline started. Was residing in her home while it was in probate. Reports it has been sold during his admission stay. Has also been evicted from his apartment as no one was checking his mail for him.   He reports stable appetite PTA. Diet appears to be lacking in both calories, protein, and vitamins/minerals. Given chronic wounds as well as difficulty accessing balanced diet, will recommend micronutrient lab draw to assess  for deficiencies that may be impacting his wound healing.   24 Hour Recall B: skips L: microwaveable meal w/ water D: microwaveable meal w/ water Snacks: chocolate  Discussed his current level of intake and the need for increased protein and calories to promote weight maintenance and improvement in skin condition. He is amicable to Ensure and Juven supplements to augment current intake and promote wound healing.   Admit/Current Weight: 77.7 kg  Reports relatively stable weight between 160-170lbs over last year. Per chart review, has gained weight compared to last admission five months ago. No edema on exam. Bowels stable with appropriate amount of ostomy output.   Drains/Lines: LLQ: colostomy: x24 hours Suprapubic catheter (20Fr) UOP: 1950 ml x24 hours  Given that discharge tentatively scheduled for Monday, will order micronutrient lab draw to assess for micronutrient deficiencies that could be impacting wound healing. Will allow time for result and order repletion, should it be indicated.   Meds: ferrous sulfate, hydrochlorothiazide , IV ABX   Labs from 10/7 reviewed: Na+ 138 (wdl) K+ 3.6 (wdl) CBGs  102-102 x24 hours   NUTRITION - FOCUSED PHYSICAL EXAM:  Pt with severe muscle depletions in lower extremities 2/2 paraplegia and spina bifida and not under nourishment. Will not use as diagnostic criteria for malnutrition. No significant muscle or fat depletions observed to upper body, however likely not meeting his increased calorie/protein needs r/t wound healing and infection.   Flowsheet Row Most Recent Value  Orbital Region No depletion  Upper Arm Region No depletion  Thoracic and Lumbar Region Mild depletion  Buccal Region  No depletion  Temple Region Mild depletion  Clavicle Bone Region No depletion  Clavicle and Acromion Bone Region No depletion  Scapular Bone Region No depletion  Dorsal Hand No depletion  Patellar Region Severe depletion  Anterior Thigh Region  Severe depletion  Posterior Calf Region Severe depletion  Edema (RD Assessment) None  Hair Reviewed  Eyes Reviewed  Mouth Reviewed  [poor dentition]  Skin Reviewed  Nails Reviewed   Diet Order:   Diet Order             Diet regular Room service appropriate? Yes; Fluid consistency: Thin  Diet effective now             EDUCATION NEEDS:   Education needs have been addressed  Skin:  Skin Assessment: Skin Integrity Issues: Skin Integrity Issues:: Unstageable, Stage IV Stage IV: hip Unstageable: coccyx  Last BM:  10/8 - x24 hours via ostomy  Height:  Ht Readings from Last 1 Encounters:  10/24/23 5' 4 (1.626 m)   Weight:  Wt Readings from Last 1 Encounters:  10/24/23 77.7 kg   Ideal Body Weight:  56.1 kg (5% subtracted d/t paraplegia)   BMI:  Body mass index is 29.4 kg/m.  Estimated Nutritional Needs:   Kcal:  1900-2100 kcals  Protein:  100-115g  Fluid:  >2L/day  Blair Deaner MS, RD, LDN Registered Dietitian Clinical Nutrition RD Inpatient Contact Info in Amion

## 2023-10-28 NOTE — Progress Notes (Signed)
 [facility]  Date of Admission:  10/23/2023     This very pleasant 53 year old patient with significant past medical history of spina bifida, bilateral hip septic arthritis, decub ulcer in the sacrum, paraplegia, chronic indwelling suprapubic catheter, diverting colostomy, paraplegia presented to the ER 10/23/2023 for complaints of intermittent fevers for the past 2 weeks or so.  Patient was also found to have some purulent drainage from the chronic wounds.  Patient unfortunately states that he had to do the dressing changes himself.  His mother was placed his mother passed away.  Meanwhile patient has now become homeless.  He did have leukocytosis of 13,000 and arrival.  It was about 2.  He had a grossly positive urine analysis.  The CAT scan was consistent with osteomyelitis of the back.  He was placed on IV cefepime , metronidazole  and linezolid .  Previous history of VRE infection present.  ID consult has been called for further evaluation.   ASSESSMENT: Sepsis, resolved. Source of sepsis acute on chronic osteomyelitis of the sacrum, coccyx, bilateral hips. Patient has suprapubic catheter, patient thinks his urine did not look cloudy and usually whenever he has UTI, the urine is quite cloudy. On and off fevers, resolved. Paraplegia. Patient looks much better today.  He is tolerating lunch very well. Diverting colostomy present. Spina bifid present. Sacral wound cultures are still pending. Somehow urine cultures 1 are not seen in the computer. Patient seems to be tolerating the antibiotics quite well. He has no acute complaints as of today.  PLAN: Continue IV cefepime , Flagyl  as well as linezolid .  Dose as per pharmacy. Patient did have VRE infection last year. Since patient was already on IV antibiotics when the cultures were sent, not sure about the microbiology yield. Consider PICC line placement. Recommend at least a minimum of 6 to 8 weeks of  antibiotics. Follow-up with ID as an outpatient in 2 to 3 weeks, and then they will decide as to how long the patient will need the antibiotics. Will be able to narrow down the spectrum of antibiotics if patient has positive cultures. Otherwise we will have to send the patient on IV cefepime , oral Flagyl  and probably IV daptomycin  as linezolid  long-term can cause cytopenias, neuropathy, visual issues.  Active Problems:   Spina bifida (HCC)   Chronic health problem   Unstageable pressure ulcer of sacral region [facility])   Injury of right thumb   History of suprapubic catheter   Sacral wound   Osteomyelitis, pelvic region and thigh (HCC)    collagenase    Topical Daily   enoxaparin  (LOVENOX ) injection  40 mg Subcutaneous Daily   ferrous sulfate  325 mg Oral Q breakfast   flecainide   100 mg Oral BID   lisinopril   20 mg Oral Daily   And   hydrochlorothiazide   25 mg Oral Daily   liver oil-zinc  oxide   Topical BID   metoprolol  succinate  50 mg Oral Daily   rosuvastatin   40 mg Oral Daily    SUBJECTIVE: Patient today looks much better.  He seems to be in a very good mood he is tolerating his lunch.  He has no acute complaints.  Review of Systems: Review of Systems  Constitutional: Negative.   HENT: Negative.    Eyes: Negative.   Respiratory: Negative.    Cardiovascular: Negative.   Gastrointestinal: Negative.   Genitourinary: Negative.   Musculoskeletal: Negative.   Skin:  Multiple wounds present.  Neurological: Negative.   Endo/Heme/Allergies: Negative.   Psychiatric/Behavioral: Negative.      Allergies  Allergen Reactions   Firvanq [Vancomycin] Itching    Redman's Syndrome    OBJECTIVE: Vitals:   10/28/23 0405 10/28/23 0812 10/28/23 0816 10/28/23 1228  BP: (!) 94/57 93/61 109/64 108/64  Pulse: 78 78 84 73  Resp: 18 18  12   Temp: 98 F (36.7 C) 98.3 F (36.8 C)  98.6 F (37 C)  TempSrc: Oral Oral  Oral  SpO2: 93% 94%  93%  Weight:      Height:       Body  mass index is 29.4 kg/m.  Physical Exam Constitutional:      General: He is not in acute distress.    Appearance: Normal appearance.  HENT:     Head: Normocephalic and atraumatic.     Right Ear: External ear normal.     Left Ear: External ear normal.     Nose: Nose normal.     Mouth/Throat:     Mouth: Mucous membranes are moist.  Eyes:     Conjunctiva/sclera: Conjunctivae normal.     Pupils: Pupils are equal, round, and reactive to light.  Cardiovascular:     Rate and Rhythm: Normal rate and regular rhythm.  Pulmonary:     Effort: Pulmonary effort is normal.     Breath sounds: Normal breath sounds.  Abdominal:     General: Bowel sounds are normal.     Palpations: Abdomen is soft.     Comments: Suprapubic catheter draining clear urine.  Colostomy present which is empty.  Musculoskeletal:     Comments: Large sacral wound present.  Ischial wounds present.  Decub are covered with dressings.  Neurological:     Mental Status: He is alert and oriented to person, place, and time.     Comments: Patient is paraplegic.  Heel decub present.  Psychiatric:        Mood and Affect: Mood normal.        Behavior: Behavior normal.        Thought Content: Thought content normal.        Judgment: Judgment normal.     Lab Results Lab Results  Component Value Date   WBC 7.3 10/27/2023   HGB 9.5 (L) 10/27/2023   HCT 32.2 (L) 10/27/2023   MCV 74.2 (L) 10/27/2023   PLT 546 (H) 10/27/2023    Lab Results  Component Value Date   CREATININE 0.60 (L) 10/27/2023   BUN 9 10/27/2023   NA 138 10/27/2023   K 3.6 10/27/2023   CL 105 10/27/2023   CO2 24 10/27/2023    Lab Results  Component Value Date   ALT 11 10/23/2023   AST 13 (L) 10/23/2023   ALKPHOS 95 10/23/2023   BILITOT 0.5 10/23/2023     Microbiology: Recent Results (from the past 240 hours)  Blood culture (routine x 2)     Status: None   Collection Time: 10/23/23  9:18 PM   Specimen: BLOOD  Result Value Ref Range Status    Specimen Description BLOOD RIGHT ANTECUBITAL  Final   Special Requests   Final    BOTTLES DRAWN AEROBIC AND ANAEROBIC Blood Culture adequate volume   Culture   Final    NO GROWTH 5 DAYS Performed at [facility], [address] [place], [place] [address]    Report Status 10/28/2023 FINAL  Final  Blood culture (routine x 2)  Status: None   Collection Time: 10/23/23  9:21 PM   Specimen: BLOOD  Result Value Ref Range Status   Specimen Description BLOOD LEFT ANTECUBITAL  Final   Special Requests   Final    BOTTLES DRAWN AEROBIC AND ANAEROBIC Blood Culture adequate volume   Culture   Final    NO GROWTH 5 DAYS Performed at [facility], [address] [place], [place] [address]    Report Status 10/28/2023 FINAL  Final  Resp panel by RT-PCR (RSV, Flu A&B, Covid) Anterior Nasal Swab     Status: None   Collection Time: 10/23/23  9:26 PM   Specimen: Anterior Nasal Swab  Result Value Ref Range Status   SARS Coronavirus 2 by RT PCR NEGATIVE NEGATIVE Final   Influenza A by PCR NEGATIVE NEGATIVE Final   Influenza B by PCR NEGATIVE NEGATIVE Final    Comment: (NOTE) The Xpert Xpress SARS-CoV-2/FLU/RSV plus assay is intended as an aid in the diagnosis of influenza from Nasopharyngeal swab specimens and should not be used as a sole basis for treatment. Nasal washings and aspirates are unacceptable for Xpert Xpress SARS-CoV-2/FLU/RSV testing.  Fact Sheet for Patients: BloggerCourse.com  Fact Sheet for Healthcare Providers: SeriousBroker.it  This test is not yet approved or cleared by the [place]  FDA and has been authorized for detection and/or diagnosis of SARS-CoV-2 by FDA under an Emergency Use Authorization (EUA). This EUA will remain in effect (meaning this test can be used) for the duration of the COVID-19 declaration under Section 564(b)(1) of the Act, 21 U.S.C. section 360bbb-3(b)(1), unless the authorization is  terminated or revoked.     Resp Syncytial Virus by PCR NEGATIVE NEGATIVE Final    Comment: (NOTE) Fact Sheet for Patients: BloggerCourse.com  Fact Sheet for Healthcare Providers: SeriousBroker.it  This test is not yet approved or cleared by the [place]  FDA and has been authorized for detection and/or diagnosis of SARS-CoV-2 by FDA under an Emergency Use Authorization (EUA). This EUA will remain in effect (meaning this test can be used) for the duration of the COVID-19 declaration under Section 564(b)(1) of the Act, 21 U.S.C. section 360bbb-3(b)(1), unless the authorization is terminated or revoked.  Performed at [facility], [address] [place], [place] [address]   MRSA Next Gen by PCR, Nasal     Status: None   Collection Time: 10/24/23  1:26 PM   Specimen: Nasal Mucosa; Nasal Swab  Result Value Ref Range Status   MRSA by PCR Next Gen NOT DETECTED NOT DETECTED Final    Comment: (NOTE) The GeneXpert MRSA Assay (FDA approved for NASAL specimens only), is one component of a comprehensive MRSA colonization surveillance program. It is not intended to diagnose MRSA infection nor to guide or monitor treatment for MRSA infections. Test performance is not FDA approved in patients less than 76 years old. Performed at [facility], [address] [place], [place] [address]   Aerobic/Anaerobic Culture w Gram Stain (surgical/deep wound)     Status: None (Preliminary result)   Collection Time: 10/27/23  3:16 PM   Specimen: Sacral  Result Value Ref Range Status   Specimen Description SACRAL  Final   Special Requests NONE  Final   Gram Stain   Final    NO WBC SEEN NO ORGANISMS SEEN Performed at [facility], [address], [place], [place] [address]    Culture PENDING  Incomplete   Report Status PENDING  Incomplete  Katharine Metro, MD [facility] Cell phone : [phone]  @TODAY @ 1:14 PM   Total Encounter Time: 50 minutes

## 2023-10-28 NOTE — Progress Notes (Signed)
     Daily Progress Note Intern Pager: [phone]  Patient name: Anthony [name] Medical record number: [id number] Date of birth: May 20, 1970 Age: 53 y.o. Gender: male  Primary Care Provider: Jerrie Gathers, DO Consultants: Orthopedic surgery, ID Code Status: Full  Pt Overview and Major Events to Date:  10/3: Admitted  Medical Decision Making:  Anthony [name] is a 53 y.o. male with a PMH of a paaraplegia 2/2 spina bifida, chronic sacral ulcers, HTN, HLD, R rotator cuff tear who was admitted for sepsis, now resolved, in the setting of extensive sacral pressure injuries.  Assessment & Plan Sepsis (HCC) (Resolved: 10/28/2023) Unstageable pressure ulcer of sacral region (HCC) Hypotensive overnight to 94/5, improving. Wound cultures collected yesterday.  - Ortho consulted, plans to see today, appreciate recommendations   - No intervention at this time - ID consulted, appreciate recommendations   - CRP, ESR in am  - wound culture: no WBC, no organisms seen, NG <24 hours  - RD consulted, appreciate recommendation  - Continue Cefepime  q8h, Linezolid  q12h, Metronidazole  q12h (10/3-) narrow as able - Tylenol  650mg  q6h PRN - Consider IR consults if worsening/pending culture results - PT/OT eval and treat - am CBC Injury of right thumb Small puncture wound from incident with pruning shears at home. Received Tdap.  Chronic health problem Iron  deficiency - continue home ferrous sulfate 325 mg daily PVCs - continue home Flecanide 100 mg BID HTN - continue home lisinopril -hydrochlorothiazide  20-25 mg and metoprolol  succinate 50 mg daily HLD - continue home rosuvastatin  40 mg daily  Suprapubic catheter - stable Colostomy - stable  FEN/GI: Regular PPx: Lovenox   Dispo: SNF pending clinical improvement.   Subjective:  Patient states he is doing fine. His R shoulder pain has improved. He denies any other pain at this time.   Objective: Temp:  [98 F (36.7 C)-98.8 F (37.1 C)] 98 F (36.7  C) (10/08 0405) Pulse Rate:  [69-89] 78 (10/08 0405) Resp:  [13-21] 18 (10/08 0405) BP: (94-112)/(57-72) 94/57 (10/08 0405) SpO2:  [93 %-97 %] 93 % (10/08 0405) Physical Exam: General: NAD Cardiovascular: RRR, no m/r/g Respiratory: CTAB, normal work of breathing on room air  Abdomen: soft, non-distended, non-tender to palpation, ostomy in place Extremities: thin, no edema   Laboratory: Most recent CBC Lab Results  Component Value Date   WBC 7.3 10/27/2023   HGB 9.5 (L) 10/27/2023   HCT 32.2 (L) 10/27/2023   MCV 74.2 (L) 10/27/2023   PLT 546 (H) 10/27/2023   Most recent BMP    Latest Ref Rng & Units 10/27/2023    3:44 AM  BMP  Glucose 70 - 99 mg/dL 897   BUN 6 - 20 mg/dL 9   Creatinine 9.38 - 8.75 mg/dL 9.39   Sodium 864 - 854 mmol/L 138   Potassium 3.5 - 5.1 mmol/L 3.6   Chloride 98 - 111 mmol/L 105   CO2 22 - 32 mmol/L 24   Calcium  8.9 - 10.3 mg/dL 8.5    Imaging/Diagnostic Tests: No new imaging.    [name], DO 10/28/2023, 7:16 AM  PGY-1, [facility] FPTS Intern pager: [phone], text pages welcome Secure chat group [facility] Teaching Service

## 2023-10-28 NOTE — Plan of Care (Signed)
  Problem: Education: Goal: Knowledge of General Education information will improve Description: Including pain rating scale, medication(s)/side effects and non-pharmacologic comfort measures Outcome: Progressing   Problem: Health Behavior/Discharge Planning: Goal: Ability to manage health-related needs will improve Outcome: Progressing   Problem: Clinical Measurements: Goal: Ability to maintain clinical measurements within normal limits will improve Outcome: Progressing   Problem: Skin Integrity: Goal: Risk for impaired skin integrity will decrease Outcome: Progressing   

## 2023-10-28 NOTE — Plan of Care (Signed)
?  Problem: Clinical Measurements: ?Goal: Respiratory complications will improve ?Outcome: Progressing ?  ?Problem: Activity: ?Goal: Risk for activity intolerance will decrease ?Outcome: Progressing ?  ?Problem: Elimination: ?Goal: Will not experience complications related to urinary retention ?Outcome: Progressing ?  ?

## 2023-10-28 NOTE — Assessment & Plan Note (Signed)
 Iron  deficiency - continue home ferrous sulfate 325 mg daily PVCs - continue home Flecanide 100 mg BID HTN - continue home lisinopril -hydrochlorothiazide  20-25 mg and metoprolol  succinate 50 mg daily HLD - continue home rosuvastatin  40 mg daily  Suprapubic catheter - stable Colostomy - stable

## 2023-10-28 NOTE — Progress Notes (Signed)
 Physical Therapy Discharge Patient Details Name: Anthony [name] MRN: [id number] DOB: 1970/12/19 Today's Date: 10/28/2023 Time: 8969-8955 PT Time Calculation (min) (ACUTE ONLY): 14 min  Patient discharged from PT services secondary to goals met and no further PT needs identified.  Please see latest therapy progress note for current level of functioning and progress toward goals.    Progress and discharge plan discussed with patient and/or caregiver: Patient/Caregiver agrees with plan  GP     Stephane JULIANNA Bevel 10/28/2023, 1:34 PM  Frederick Klinger M,PT Acute Rehab Services [phone]

## 2023-10-28 NOTE — TOC Progression Note (Signed)
 Transition of Care [facility]) - Progression Note    Patient Details  Name: Anthony [name] MRN: [id number] Date of Birth: 11-29-1970  [facility] CM/SW Contact  Lauraine FORBES Saa, LCSWA Phone Number: 10/28/2023, 11:54 AM  Clinical Narrative:     11:54 AM Per medical team, patient is anticipated to have a PICC at discharge (anticipated Monday) for 6-8 weeks IV regiment. [name] to submit SNF insurance authorization request closer to patient's anticipated discharge date. [name] will continue to follow.  Expected Discharge Plan: Skilled Nursing Facility Barriers to Discharge: English as a second language teacher, Continued Medical Work up               Expected Discharge Plan and Services In-house Referral: Clinical Social Work   Post Acute Care Choice: Skilled Nursing Facility Living arrangements for the past 2 months: Single Family Home                                       Social Drivers of Health (SDOH) Interventions SDOH Screenings   Food Insecurity: No Food Insecurity (10/24/2023)  Housing: High Risk (10/24/2023)  Transportation Needs: Unmet Transportation Needs (10/24/2023)  Utilities: At Risk (10/24/2023)  Alcohol Screen: Low Risk  (10/15/2022)  Depression (PHQ2-9): Low Risk  (10/15/2022)  Financial Resource Strain: Low Risk  (10/15/2022)  Physical Activity: Inactive (10/15/2022)  Social Connections: Moderately Integrated (10/15/2022)  Stress: No Stress Concern Present (10/15/2022)  Tobacco Use: Medium Risk (10/23/2023)  Health Literacy: Adequate Health Literacy (10/15/2022)    Readmission Risk Interventions     No data to display

## 2023-10-29 ENCOUNTER — Other Ambulatory Visit: Payer: Self-pay

## 2023-10-29 DIAGNOSIS — S31000A Unspecified open wound of lower back and pelvis without penetration into retroperitoneum, initial encounter: Secondary | ICD-10-CM | POA: Diagnosis not present

## 2023-10-29 DIAGNOSIS — M8660 Other chronic osteomyelitis, unspecified site: Secondary | ICD-10-CM

## 2023-10-29 DIAGNOSIS — M8610 Other acute osteomyelitis, unspecified site: Secondary | ICD-10-CM

## 2023-10-29 LAB — CBC
HCT: 33.8 % — ABNORMAL LOW (ref 39.0–52.0)
Hemoglobin: 10.1 g/dL — ABNORMAL LOW (ref 13.0–17.0)
MCH: 22.2 pg — ABNORMAL LOW (ref 26.0–34.0)
MCHC: 29.9 g/dL — ABNORMAL LOW (ref 30.0–36.0)
MCV: 74.4 fL — ABNORMAL LOW (ref 80.0–100.0)
Platelets: 477 K/uL — ABNORMAL HIGH (ref 150–400)
RBC: 4.54 MIL/uL (ref 4.22–5.81)
RDW: 18.2 % — ABNORMAL HIGH (ref 11.5–15.5)
WBC: 6.9 K/uL (ref 4.0–10.5)
nRBC: 0 % (ref 0.0–0.2)

## 2023-10-29 LAB — VITAMIN D 25 HYDROXY (VIT D DEFICIENCY, FRACTURES): Vit D, 25-Hydroxy: 33.73 ng/mL (ref 30–100)

## 2023-10-29 LAB — SEDIMENTATION RATE: Sed Rate: 58 mm/h — ABNORMAL HIGH (ref 0–16)

## 2023-10-29 LAB — C-REACTIVE PROTEIN: CRP: 2.2 mg/dL — ABNORMAL HIGH (ref <1.0)

## 2023-10-29 MED ORDER — SODIUM CHLORIDE 0.9 % IV SOLN
10.0000 mg/kg | Freq: Every day | INTRAVENOUS | Status: DC
  Administered 2023-10-29 – 2023-11-02 (×5): 800 mg via INTRAVENOUS
  Filled 2023-10-29 (×5): qty 16

## 2023-10-29 MED ORDER — SODIUM CHLORIDE 0.9% FLUSH
10.0000 mL | Freq: Two times a day (BID) | INTRAVENOUS | Status: DC
  Administered 2023-10-29 – 2023-11-02 (×8): 10 mL

## 2023-10-29 MED ORDER — CHLORHEXIDINE GLUCONATE CLOTH 2 % EX PADS
6.0000 | MEDICATED_PAD | Freq: Every day | CUTANEOUS | Status: DC
  Administered 2023-10-29 – 2023-11-02 (×5): 6 via TOPICAL

## 2023-10-29 MED ORDER — SODIUM CHLORIDE 0.9% FLUSH: 10.0000 mL | INTRAVENOUS | Status: DC | PRN

## 2023-10-29 MED ORDER — METRONIDAZOLE 500 MG PO TABS
500.0000 mg | ORAL_TABLET | Freq: Two times a day (BID) | ORAL | Status: DC
  Administered 2023-10-29 – 2023-11-02 (×9): 500 mg via ORAL
  Filled 2023-10-29 (×9): qty 1

## 2023-10-29 NOTE — TOC Progression Note (Addendum)
 Transition of Care [facility]) - Progression Note    Patient Details  Name: Anthony [name] MRN: [id number] Date of Birth: [birth year]  Transition of Care [facility]) CM/SW Contact  Lauraine FORBES Saa, LCSWA Phone Number: 10/29/2023, 11:47 AM  Clinical Narrative:     11:47 AM Per resident MD, orders have been placed for PICC line. Per progressions, patient is anticipated to have long term IV RX (6 weeks). [name] relayed above information to SNF and informed them of patient's anticipated discharge date of Monday. [name] to submit SNF insurance authorization tomorrow upon PICC placement. [name] will continue to follow.  4:12 PM Resident MD informed [name] that patient received PICC for 6 weeks IV ABX. [name] submitted SNF insurance authorization which was approved for 10/31/2023-11/03/2023 (Reference Number [id number]) as SNF informed [name] that next bed available would be Saturday. Medical team and SNF made aware.  Expected Discharge Plan: Skilled Nursing Facility Barriers to Discharge: English as a second language teacher, Continued Medical Work up               Expected Discharge Plan and Services In-house Referral: Clinical Social Work   Post Acute Care Choice: Skilled Nursing Facility Living arrangements for the past 2 months: Single Family Home                                       Social Drivers of Health (SDOH) Interventions SDOH Screenings   Food Insecurity: No Food Insecurity (10/24/2023)  Housing: High Risk (10/24/2023)  Transportation Needs: Unmet Transportation Needs (10/24/2023)  Utilities: At Risk (10/24/2023)  Alcohol Screen: Low Risk  (10/15/2022)  Depression (PHQ2-9): Low Risk  (10/15/2022)  Financial Resource Strain: Low Risk  (10/15/2022)  Physical Activity: Inactive (10/15/2022)  Social Connections: Moderately Integrated (10/15/2022)  Stress: No Stress Concern Present (10/15/2022)  Tobacco Use: Medium Risk (10/23/2023)  Health Literacy: Adequate Health Literacy (10/15/2022)    Readmission Risk  Interventions     No data to display

## 2023-10-29 NOTE — Assessment & Plan Note (Addendum)
 Afebrile, hemodynamically stable.  - Ortho consulted, appreciate recommendations   - No intervention at this time  - Air mattress   - daily Vashe dressing changes  - ID consulted, appreciate recommendations   - CRP, ESR pending  - wound culture: culture showed rare enterococcus faecalis   - gram stain: no WBC, no organisms seen  - Linezolid  swapped to Daptomycin  2/2 long-term effects of Linezolid   - RD consulted, appreciate recommendation   - Add Ensure Max BID  - Wound healing labs: vitamin A , D, C, zinc , and CRP pending   - Add multivitamin with minerals  - Continue Cefepime  q8h, Daptomycin  800 mg (switched from Linezolid  today), Metronidazole  q12h (10/3-) narrow as able - Tylenol  650mg  q6h PRN - Consider IR consults if worsening/pending culture results - PT/OT eval and treat - am CBC, BMP, CK

## 2023-10-29 NOTE — Plan of Care (Signed)
   Problem: Health Behavior/Discharge Planning: Goal: Ability to manage health-related needs will improve Outcome: Progressing   Problem: Clinical Measurements: Goal: Ability to maintain clinical measurements within normal limits will improve Outcome: Progressing   Problem: Clinical Measurements: Goal: Will remain free from infection Outcome: Progressing

## 2023-10-29 NOTE — Progress Notes (Signed)
 Peripherally Inserted Central Catheter Placement  The IV Nurse has discussed with the patient and/or persons authorized to consent for the patient, the purpose of this procedure and the potential benefits and risks involved with this procedure.  The benefits include less needle sticks, lab draws from the catheter, and the patient may be discharged home with the catheter. Risks include, but not limited to, infection, bleeding, blood clot (thrombus formation), and puncture of an artery; nerve damage and irregular heartbeat and possibility to perform a PICC exchange if needed/ordered by physician.  Alternatives to this procedure were also discussed.  Bard Power PICC patient education guide, fact sheet on infection prevention and patient information card has been provided to patient /or left at bedside.    PICC Placement Documentation  PICC Single Lumen 10/29/23 Left Brachial 44 cm 0 cm (Active)  Indication for Insertion or Continuance of Line Prolonged intravenous therapies 10/29/23 1151  Exposed Catheter (cm) 0 cm 10/29/23 1151  Site Assessment Clean, Dry, Intact 10/29/23 1151  Line Status Flushed;Blood return noted;Saline locked 10/29/23 1151  Dressing Type Transparent 10/29/23 1151  Dressing Status Clean, Dry, Intact;Antimicrobial disc/dressing in place 10/29/23 1151  Line Care Connections checked and tightened 10/29/23 1151  Line Adjustment (NICU/IV Team Only) No 10/29/23 1151  Dressing Intervention New dressing 10/29/23 1151  Dressing Change Due 11/05/23 10/29/23 1151       Shermika Balthaser Ramos 10/29/2023, 11:52 AM

## 2023-10-29 NOTE — Plan of Care (Signed)

## 2023-10-29 NOTE — Assessment & Plan Note (Signed)
 Small puncture wound from incident with pruning shears at home. Received Tdap.

## 2023-10-29 NOTE — Progress Notes (Signed)
 PHARMACY CONSULT NOTE FOR:  OUTPATIENT  PARENTERAL ANTIBIOTIC THERAPY (OPAT)  Indication: Sacral osteomyelitis  Regimen: Daptomycin  IV 800 mg daily + cefepime  IV 2g every 8 hours + metronidazole  PO 500 mg 2 times daily End date: 12/04/2023  IV antibiotic discharge orders are pended. To discharging provider:  please sign these orders via discharge navigator,  Select New Orders & click on the button choice - Manage This Unsigned Work.    Thank you for allowing pharmacy to be a part of this patient's care.  Feliciano Close, PharmD PGY2 Infectious Diseases Pharmacy Resident  10/29/2023 4:28 PM

## 2023-10-29 NOTE — Progress Notes (Signed)
 [facility]  Date of Admission:  10/23/2023     Reason for Follow Up: Sepsis [facility])  Total days of antibiotics 7         ASSESSMENT:  [name] is a 53 year old Caucasian male with spina bifida, paraplegia, diverting colostomy and chronic indwelling suprapubic catheter presenting with 2-week history of intermittent fever and found to have purulent drainage from chronic wounds and acute on chronic osteomyelitis of the sacrum, coccyx, and bilateral hips.  Sacral specimen growing Enterococcus faecalis with no evidence of bacteremia.  [name] Enterococcus faecalis sensitivities remain pending.  Discussed plan of care for 6 weeks of IV antibiotics with daptomycin , cefepime  and oral metronidazole . PICC line in place. Will place OPAT orders. Disposition to skilled nursing. Continue wound care per Orthopedic recommendations. Optimize protein intake and site off loading. Ok for discharge from ID standpoint and will arrange follow up in ID clinic. Remaining medical and supportive care per [facility] Medicine Team.   PLAN:  Continue current dose of cefepime  and oral metronidazole . Start daptomycin  and discontinue linezolid .  Therapeutic drug monitoring of CK levels while on Daptomycin . PICC line care per protocol.  Site off loading and optimize protein intake.  Contact precautions for VRE. Wound care per Orthopedic recommendations. Ok for discharge from ID standpoint and will follow up in ID clinic.  Remaining medical and supportive care per [facility] Medicine.   ID will sign off. Please re-consult if needed.    Diagnosis:  Acute on chronic osteomyelitis  Culture Result: Enterococcus faecalis  Allergies  Allergen Reactions   Firvanq [Vancomycin] Itching    Redman's Syndrome    OPAT Orders Discharge antibiotics to be given via PICC line Discharge antibiotics:Daptomycin  800 mg IV daily, Cefepime  2 g IV every 8 hours and oral metronidazole  500 mg q 12  hours Per pharmacy protocol   Duration: 6 weeks   End Date: 12/04/23  [facility] Per Protocol:  Home health RN for IV administration and teaching; PICC line care and labs.    Labs weekly while on IV antibiotics: _X_ CBC with differential __ BMP _X_ CRP _X_ ESR __ Vancomycin trough _X_ CK  _X_ Please pull PIC at completion of IV antibiotics __ Please leave PIC in place until doctor has seen patient or been notified  Fax weekly labs to [phone]  Clinic Follow Up Appt:  Cathlyn July 9am on 11/23/23   Active Problems:   Spina bifida (HCC)   Sepsis without acute organ dysfunction (HCC)   Chronic health problem   Unstageable pressure ulcer of sacral region [facility])   Injury of right thumb   History of suprapubic catheter   Sacral wound   Osteomyelitis, pelvic region and thigh (HCC)    Chlorhexidine  Gluconate Cloth  6 each Topical Daily   collagenase    Topical Daily   enoxaparin  (LOVENOX ) injection  40 mg Subcutaneous Daily   feeding supplement  237 mL Oral BID BM   ferrous sulfate  325 mg Oral Q breakfast   flecainide   100 mg Oral BID   lisinopril   20 mg Oral Daily   And   hydrochlorothiazide   25 mg Oral Daily   liver oil-zinc  oxide   Topical BID   metoprolol  succinate  50 mg Oral Daily   metroNIDAZOLE   500 mg Oral Q12H   multivitamin with minerals  1 tablet Oral Daily   nutrition supplement (JUVEN)  1 packet Oral BID AC & HS   rosuvastatin   40 mg Oral Daily  sodium chloride  flush  10-40 mL Intracatheter Q12H    SUBJECTIVE:  Afebrile overnight with no acute events.  Tolerating antibiotics with no adverse side effects.  Allergies  Allergen Reactions   Firvanq [Vancomycin] Itching    Redman's Syndrome     Review of Systems: Review of Systems  Constitutional:  Negative for chills, fever and weight loss.  Respiratory:  Negative for cough, shortness of breath and wheezing.   Cardiovascular:  Negative for chest pain and leg swelling.  Gastrointestinal:   Negative for abdominal pain, constipation, diarrhea, nausea and vomiting.  Skin:  Negative for rash.      OBJECTIVE: Vitals:   10/29/23 0432 10/29/23 0752 10/29/23 1215 10/29/23 1517  BP: 104/60 94/64 112/65 117/70  Pulse: 77 79 83 83  Resp: (!) 21 16 16 20   Temp: 98.2 F (36.8 C) 98.3 F (36.8 C) 98.4 F (36.9 C) 98.5 F (36.9 C)  TempSrc: Oral Oral Oral Oral  SpO2: 97%  95% 98%  Weight:      Height:       Body mass index is 29.4 kg/m.  Physical Exam Constitutional:      General: He is not in acute distress.    Appearance: He is well-developed.     Comments: Lying in bed; pleasant   Cardiovascular:     Rate and Rhythm: Normal rate and regular rhythm.     Heart sounds: Normal heart sounds.  Pulmonary:     Effort: Pulmonary effort is normal.     Breath sounds: Normal breath sounds.  Skin:    General: Skin is warm and dry.  Neurological:     Mental Status: He is alert.     Lab Results Lab Results  Component Value Date   WBC 6.9 10/29/2023   HGB 10.1 (L) 10/29/2023   HCT 33.8 (L) 10/29/2023   MCV 74.4 (L) 10/29/2023   PLT 477 (H) 10/29/2023    Lab Results  Component Value Date   CREATININE 0.60 (L) 10/27/2023   BUN 9 10/27/2023   NA 138 10/27/2023   K 3.6 10/27/2023   CL 105 10/27/2023   CO2 24 10/27/2023    Lab Results  Component Value Date   ALT 11 10/23/2023   AST 13 (L) 10/23/2023   ALKPHOS 95 10/23/2023   BILITOT 0.5 10/23/2023     Microbiology: Recent Results (from the past 240 hours)  Blood culture (routine x 2)     Status: None   Collection Time: 10/23/23  9:18 PM   Specimen: BLOOD  Result Value Ref Range Status   Specimen Description BLOOD RIGHT ANTECUBITAL  Final   Special Requests   Final    BOTTLES DRAWN AEROBIC AND ANAEROBIC Blood Culture adequate volume   Culture   Final    NO GROWTH 5 DAYS Performed at [facility], [address], [place], [place] [address]    Report Status 10/28/2023 FINAL  Final  Blood culture  (routine x 2)     Status: None   Collection Time: 10/23/23  9:21 PM   Specimen: BLOOD  Result Value Ref Range Status   Specimen Description BLOOD LEFT ANTECUBITAL  Final   Special Requests   Final    BOTTLES DRAWN AEROBIC AND ANAEROBIC Blood Culture adequate volume   Culture   Final    NO GROWTH 5 DAYS Performed at [facility], [address] [place], [place] [address]    Report Status 10/28/2023 FINAL  Final  Resp panel  by RT-PCR (RSV, Flu A&B, Covid) Anterior Nasal Swab     Status: None   Collection Time: 10/23/23  9:26 PM   Specimen: Anterior Nasal Swab  Result Value Ref Range Status   SARS Coronavirus 2 by RT PCR NEGATIVE NEGATIVE Final   Influenza A by PCR NEGATIVE NEGATIVE Final   Influenza B by PCR NEGATIVE NEGATIVE Final    Comment: (NOTE) The Xpert Xpress SARS-CoV-2/FLU/RSV plus assay is intended as an aid in the diagnosis of influenza from Nasopharyngeal swab specimens and should not be used as a sole basis for treatment. Nasal washings and aspirates are unacceptable for Xpert Xpress SARS-CoV-2/FLU/RSV testing.  Fact Sheet for Patients: BloggerCourse.com  Fact Sheet for Healthcare Providers: SeriousBroker.it  This test is not yet approved or cleared by the [place]  FDA and has been authorized for detection and/or diagnosis of SARS-CoV-2 by FDA under an Emergency Use Authorization (EUA). This EUA will remain in effect (meaning this test can be used) for the duration of the COVID-19 declaration under Section 564(b)(1) of the Act, 21 U.S.C. section 360bbb-3(b)(1), unless the authorization is terminated or revoked.     Resp Syncytial Virus by PCR NEGATIVE NEGATIVE Final    Comment: (NOTE) Fact Sheet for Patients: BloggerCourse.com  Fact Sheet for Healthcare Providers: SeriousBroker.it  This test is not yet approved or cleared by the [place]  FDA  and has been authorized for detection and/or diagnosis of SARS-CoV-2 by FDA under an Emergency Use Authorization (EUA). This EUA will remain in effect (meaning this test can be used) for the duration of the COVID-19 declaration under Section 564(b)(1) of the Act, 21 U.S.C. section 360bbb-3(b)(1), unless the authorization is terminated or revoked.  Performed at [facility], [address] [place], [place] [address]   MRSA Next Gen by PCR, Nasal     Status: None   Collection Time: 10/24/23  1:26 PM   Specimen: Nasal Mucosa; Nasal Swab  Result Value Ref Range Status   MRSA by PCR Next Gen NOT DETECTED NOT DETECTED Final    Comment: (NOTE) The GeneXpert MRSA Assay (FDA approved for NASAL specimens only), is one component of a comprehensive MRSA colonization surveillance program. It is not intended to diagnose MRSA infection nor to guide or monitor treatment for MRSA infections. Test performance is not FDA approved in patients less than 10 years old. Performed at [facility], [address] [place], [place] [address]   Aerobic/Anaerobic Culture w Gram Stain (surgical/deep wound)     Status: None (Preliminary result)   Collection Time: 10/27/23  3:16 PM   Specimen: Sacral  Result Value Ref Range Status   Specimen Description SACRAL  Final   Special Requests NONE  Final   Gram Stain   Final    NO WBC SEEN NO ORGANISMS SEEN Performed at [facility], [address] [place], [place] [address]    Culture   Final    RARE ENTEROCOCCUS FAECALIS CULTURE REINCUBATED FOR BETTER GROWTH NO ANAEROBES ISOLATED; CULTURE IN PROGRESS FOR 5 DAYS    Report Status PENDING  Incomplete     Cathlyn July, NP [facility]  10/29/2023  3:48 PM

## 2023-10-29 NOTE — Evaluation (Signed)
 Physical Therapy Evaluation Patient Details Name: Anthony [name] MRN: [id number] DOB: March 27, 1970 Today's Date: 10/29/2023  History of Present Illness  Pt is a 53 y.o. male admitted 10/3 for pressure wound, found to be septic.PMH: spina bifida, recurrent decubitus ulcer, septic arthritis of bilateral hips, chronic osteomyelitis, DVT, UTI  Clinical Impression  Pt admitted with above diagnosis. Asked to reconsult as pt reported dizziness.  Pt was positive for left anterior canal BPPV and treated with Epley maneuver. Pt reports no dizziness at end of treatment however recommend PT f/u to ensure that BPPV cleared.  Will follow for vestibular treatment.     If plan is discharge home, recommend the following: Assist for transportation;Assistance with cooking/housework   Can travel by private vehicle   No    Equipment Recommendations Wheelchair cushion (measurements PT);Wheelchair (measurements PT)  Recommendations for Other Services       Functional Status Assessment Patient has had a recent decline in their functional status and demonstrates the ability to make significant improvements in function in a reasonable and predictable amount of time.     Precautions / Restrictions Precautions Precautions: Fall Recall of Precautions/Restrictions: Intact Restrictions Weight Bearing Restrictions Per Provider Order: No      Mobility  Bed Mobility Overal bed mobility: Needs Assistance Bed Mobility: Supine to Sit, Sit to Supine, Rolling Rolling: Independent   Supine to sit: Independent Sit to supine: Independent   General bed mobility comments: Manages LE ind, assist with lines    Transfers                        Ambulation/Gait                  Stairs            Wheelchair Mobility     Tilt Bed    Modified Rankin (Stroke Patients Only)       Balance                                             Pertinent Vitals/Pain Pain  Assessment Pain Assessment: No/denies pain    Home Living Family/patient expects to be discharged to:: Skilled nursing facility                        Prior Function Prior Level of Function : Independent/Modified Independent             Mobility Comments: Lateral scoots mod I ADLs Comments: Ind, manages own ostomy and catheter     Extremity/Trunk Assessment   Upper Extremity Assessment Upper Extremity Assessment: Defer to OT evaluation    Lower Extremity Assessment RLE Deficits / Details: paraplegic; no sensation LLE Deficits / Details: Paraplegic; no sensation    Cervical / Trunk Assessment Cervical / Trunk Assessment: Normal  Communication   Communication Communication: No apparent difficulties    Cognition Arousal: Alert Behavior During Therapy: WFL for tasks assessed/performed   PT - Cognitive impairments: No apparent impairments                         Following commands: Intact       Cueing Cueing Techniques: Verbal cues     General Comments General comments (skin integrity, edema, etc.): Tested pt for BPPV and pt positive for left anterior canal  BPPV and treated via Epley maneuver.Pt states that he had dizziness in positions during Epley.  No dizziness once settled after treatment.    Exercises     Assessment/Plan    PT Assessment Patient needs continued PT services  PT Problem List Other (comment) (BPPV)       PT Treatment Interventions Canalith reposition    PT Goals (Current goals can be found in the Care Plan section)  Acute Rehab PT Goals Patient Stated Goal: decr dizziness PT Goal Formulation: With patient Time For Goal Achievement: 11/12/23 Potential to Achieve Goals: Fair    Frequency Min 1X/week     Co-evaluation               AM-PAC PT 6 Clicks Mobility  Outcome Measure Help needed turning from your back to your side while in a flat bed without using bedrails?: A Little Help needed moving from  lying on your back to sitting on the side of a flat bed without using bedrails?: A Little Help needed moving to and from a bed to a chair (including a wheelchair)?: A Little Help needed standing up from a chair using your arms (e.g., wheelchair or bedside chair)?: Total Help needed to walk in hospital room?: Total Help needed climbing 3-5 steps with a railing? : Total 6 Click Score: 12    End of Session   Activity Tolerance: Patient tolerated treatment well Patient left: in bed;with call bell/phone within reach Nurse Communication: Mobility status PT Visit Diagnosis: BPPV;Dizziness and giddiness (R42) BPPV - Right/Left : Left    Time: 8951-8896 PT Time Calculation (min) (ACUTE ONLY): 15 min   Charges:   PT Evaluation $PT Eval Moderate Complexity: 1 Mod   PT General Charges $$ ACUTE PT VISIT: 1 Visit         Annisten Manchester M,PT Acute Rehab Services [phone]   Stephane JULIANNA Bevel 10/29/2023, 12:38 PM

## 2023-10-29 NOTE — Progress Notes (Signed)
 Pharmacy Antibiotic Note  Anthony [name] is a 53 y.o. male admitted on 10/23/2023 with chronic osteomyelitis of the sacrum.  Pharmacy has been consulted for daptomycin  dosing.  Plan: - Daptomycin  IV 800 mg (~10 mg/kg) daily  - Monitor CK, clinical progression, culture data, length of therapy - Continue cefepime  IV 2g every 8 hours  - Continue metronidazole  PO 500 mg every 12 hours   Height: 5' 4 (162.6 cm) Weight: 77.7 kg (171 lb 4.8 oz) IBW/kg (Calculated) : 59.2  Temp (24hrs), Avg:98.5 F (36.9 C), Min:98.2 F (36.8 C), Max:98.8 F (37.1 C)  Recent Labs  Lab 10/23/23 2012 10/23/23 2020 10/23/23 2136 10/24/23 0443 10/24/23 0759 10/25/23 0232 10/26/23 0453 10/27/23 0344  WBC 12.9*  --   --  8.5  --  7.8 6.2 7.3  CREATININE 0.81  --   --  0.64  --  0.76 0.70 0.60*  LATICACIDVEN  --  0.7 2.0*  --  1.4  --   --   --     Estimated Creatinine Clearance: 100.6 mL/min (A) (by C-G formula based on SCr of 0.6 mg/dL (L)).    Allergies  Allergen Reactions   Firvanq [Vancomycin] Itching    Redman's Syndrome   Antimicrobials this admission: Linezolid  10/3 >> 10/9 Cefepime  10/3 >>  Metronidazole  10/3 >>   Microbiology results: 10/3 BCx: NGTD x5 days 10/4 MRSA PCR: negative 10/7 sacral wound cx: Rare Enterococcus faecalis   Thank you for allowing pharmacy to be a part of this patient's care.  Feliciano Close, PharmD PGY2 Infectious Diseases Pharmacy Resident  10/29/2023 1:17 PM

## 2023-10-29 NOTE — Progress Notes (Addendum)
 Daily Progress Note Intern Pager: [phone]  Patient name: Anthony [name] Medical record number: [id number] Date of birth: 1970-07-16 Age: 53 y.o. Gender: male  Primary Care Provider: Jerrie Gathers, DO Consultants: Orthopedic surgery, ID Code Status: Full  Pt Overview and Major Events to Date:  10/3: Admitted   Medical Decision Making:  [name] is a 53 y.o. male with a PMH of paraplegia 2/2 spina bifida, chronic sacral ulcers, HTN, HLD, R rotator cuff tear who was admitted for sepsis, now resolved, in the setting of extensive sacral pressure injuries.  Assessment & Plan Unstageable pressure ulcer of sacral region (HCC) Afebrile, hemodynamically stable.  - Ortho consulted, appreciate recommendations   - No intervention at this time  - Air mattress   - daily Vashe dressing changes  - ID consulted, appreciate recommendations   - CRP, ESR pending  - wound culture: culture showed rare enterococcus faecalis   - gram stain: no WBC, no organisms seen  - Linezolid  swapped to Daptomycin  2/2 long-term effects of Linezolid   - RD consulted, appreciate recommendation   - Add Ensure Max BID  - Wound healing labs: vitamin A , D, C, zinc , and CRP pending   - Add multivitamin with minerals  - Continue Cefepime  q8h, Daptomycin  800 mg (switched from Linezolid  today), Metronidazole  q12h (10/3-) narrow as able - Tylenol  650mg  q6h PRN - Consider IR consults if worsening/pending culture results - PT/OT eval and treat - am CBC, BMP, CK Injury of right thumb Small puncture wound from incident with pruning shears at home. Received Tdap.  Chronic health problem Iron  deficiency - continue home ferrous sulfate 325 mg daily PVCs - continue home Flecanide 100 mg BID HTN - continue home lisinopril -hydrochlorothiazide  20-25 mg and metoprolol  succinate 50 mg daily HLD - continue home rosuvastatin  40 mg daily  Suprapubic catheter - stable Colostomy - stable    FEN/GI: Regular PPx:  Lovenox  Dispo: SNF pending PICC line placement and antibiotic regimen.   Subjective:  Patient is seen resting comfortably. States he is feeling well this morning. Denies any pain. Patient has no other questions or concerns at this time.   Objective: Temp:  [98.2 F (36.8 C)-98.8 F (37.1 C)] 98.2 F (36.8 C) (10/09 0432) Pulse Rate:  [64-85] 77 (10/09 0432) Resp:  [12-23] 21 (10/09 0432) BP: (93-110)/(57-66) 104/60 (10/09 0432) SpO2:  [92 %-97 %] 97 % (10/09 0432) Physical Exam: General: NAD Cardiovascular: RRR, no m/r/g Respiratory: CTAB, normal work of breathing on room air  Abdomen: soft, non-distended, non-tender to palpation  Extremities: thin, bandages over heels bilaterally, no edema   Laboratory: Most recent CBC Lab Results  Component Value Date   WBC 7.3 10/27/2023   HGB 9.5 (L) 10/27/2023   HCT 32.2 (L) 10/27/2023   MCV 74.2 (L) 10/27/2023   PLT 546 (H) 10/27/2023   Most recent BMP    Latest Ref Rng & Units 10/27/2023    3:44 AM  BMP  Glucose 70 - 99 mg/dL 897   BUN 6 - 20 mg/dL 9   Creatinine 9.38 - 8.75 mg/dL 9.39   Sodium 864 - 854 mmol/L 138   Potassium 3.5 - 5.1 mmol/L 3.6   Chloride 98 - 111 mmol/L 105   CO2 22 - 32 mmol/L 24   Calcium  8.9 - 10.3 mg/dL 8.5    Imaging/Diagnostic Tests: No new imaging.   [name], DO 10/29/2023, 7:20 AM  PGY-1, [facility] FPTS Intern pager: [phone], text pages welcome  Secure chat group [facility] Teaching Service

## 2023-10-29 NOTE — Assessment & Plan Note (Signed)
 Iron  deficiency - continue home ferrous sulfate 325 mg daily PVCs - continue home Flecanide 100 mg BID HTN - continue home lisinopril -hydrochlorothiazide  20-25 mg and metoprolol  succinate 50 mg daily HLD - continue home rosuvastatin  40 mg daily  Suprapubic catheter - stable Colostomy - stable

## 2023-10-30 DIAGNOSIS — M869 Osteomyelitis, unspecified: Secondary | ICD-10-CM | POA: Diagnosis not present

## 2023-10-30 MED ORDER — FLUCONAZOLE 200 MG PO TABS
400.0000 mg | ORAL_TABLET | Freq: Every day | ORAL | Status: DC
  Filled 2023-10-30: qty 2

## 2023-10-30 MED ORDER — FLUCONAZOLE 200 MG PO TABS
400.0000 mg | ORAL_TABLET | Freq: Every day | ORAL | Status: DC
  Administered 2023-10-30 – 2023-11-01 (×3): 400 mg via ORAL
  Filled 2023-10-30 (×3): qty 2

## 2023-10-30 NOTE — Assessment & Plan Note (Addendum)
 Small puncture wound from incident with pruning shears at home. Received Tdap.

## 2023-10-30 NOTE — Progress Notes (Signed)
 Wound care note:   Left hip dimpled single wound: I removed a wet piece of guaze the size of a quarter off the surface of the left hip. As I begin to replace new wet dressing, patient states, that he packs this very small hole with 1/2 a roll of gauze. Q-tip placed inside,does reveal a deep large cavity. This would not be apparent had the patient not said anything. Wound care orders are not specific enough to catch this and previously it had not been packed at all.  He also mentioned the right hip directly opposite has a the same situation where he packs this with about a half roll of gauze. Looking at the right hip, the hole is healed completely closed and there is not an entry point to pack. However, this area is very boggy underneath. There is another very boggy area near by posteriorly, a bit larger than a silver dollar. This area bulges out somewhat and almost appears as a mass but on palpitation very boggy.   When I tell patient I am not able to pack the right hip, as the hole has grown shut, he states that we can pack it from the underside too. It is hard to say what exactly he means by this.  With such an extreme extensive wound with so many communicating areas, it would be helpful to have more specific instructions for the dressing.

## 2023-10-30 NOTE — Assessment & Plan Note (Signed)
 Small puncture wound from incident with pruning shears at home. Received Tdap.

## 2023-10-30 NOTE — Progress Notes (Addendum)
 Daily Progress Note Intern Pager: [phone]  Patient name: Anthony [name] Medical record number: [id number] Date of birth: [birth year] Age: 53 y.o. Gender: male  Primary Care Provider: Jerrie Gathers, DO Consultants: Orthopedic surgery, ID Code Status: Full  Pt Overview and Major Events to Date:  10/3: Admitted   Medical Decision Making:  Anthony [name] is a 53 y.o. male with a PMH of paraplegia 2/2 spina bifida, chronic sacral ulcers, HTN, HLD, R rotator cuff tear who was admitted for sepsis, now resolved. He is now medically stable for discharge.  Assessment & Plan Unstageable pressure ulcer of sacral region (HCC) Afebrile, hemodynamically stable.  CRP 2.2, ESR 58.  Vitamin D  33.73. - Ortho consulted, appreciate recommendations   - No intervention at this time  - Air mattress   - daily Vashe dressing changes  - ID consulted, appreciate recommendations   - CRP, ESR both elevated (2.2 and 58, on ABX)   - wound culture: culture showed rare enterococcus faecalis   - gram stain: no WBC, no organisms seen  - Antibiotics for 6 weeks  - RD consulted, appreciate recommendation   - Add Ensure Max BID  - Wound healing labs: vitamin A , C, and zinc  pending   - Multivitamin with minerals - Wound care consulted  - Patient needs additional packing of his wounds as they are deep - Continue Cefepime  q8h, Daptomycin  800 mg (started 10/9), Metronidazole  q12h (10/3 - 11/14) narrow as able.  - Linezolid  stopped 10/9.  - Tylenol  650mg  q6h PRN - PT/OT eval and treat Injury of right thumb Small puncture wound from incident with pruning shears at home. Received Tdap.  Chronic health problem Iron  deficiency - continue home ferrous sulfate 325 mg daily PVCs - continue home Flecanide 100 mg BID HTN - continue home lisinopril -hydrochlorothiazide  20-25 mg and metoprolol  succinate 50 mg daily HLD - continue home rosuvastatin  40 mg daily  Suprapubic catheter - stable Colostomy -  stable   FEN/GI: Regular PPx: Lovenox   Dispo: SNF, [facility], tomorrow.   Subjective:  Patient is doing well this morning. States he slept okay last night. Denies any pain or concerns at this time.   Objective: Temp:  [97.6 F (36.4 C)-98.5 F (36.9 C)] 97.9 F (36.6 C) (10/10 0723) Pulse Rate:  [79-89] 79 (10/10 0723) Resp:  [16-20] 19 (10/10 0723) BP: (94-117)/(53-72) 112/72 (10/10 0723) SpO2:  [93 %-98 %] 96 % (10/10 0723) Physical Exam: General: NAD Cardiovascular: RRR, no m/r/g Respiratory: CTAB, normal work of breathing on room air  Abdomen: soft, non-distended, nontender to palpation, ostomy functioning well Extremities: thin, no edema, bandages on bilateral heels   Laboratory: Most recent CBC Lab Results  Component Value Date   WBC 6.9 10/29/2023   HGB 10.1 (L) 10/29/2023   HCT 33.8 (L) 10/29/2023   MCV 74.4 (L) 10/29/2023   PLT 477 (H) 10/29/2023   Most recent BMP    Latest Ref Rng & Units 10/27/2023    3:44 AM  BMP  Glucose 70 - 99 mg/dL 897   BUN 6 - 20 mg/dL 9   Creatinine 9.38 - 8.75 mg/dL 9.39   Sodium 864 - 854 mmol/L 138   Potassium 3.5 - 5.1 mmol/L 3.6   Chloride 98 - 111 mmol/L 105   CO2 22 - 32 mmol/L 24   Calcium  8.9 - 10.3 mg/dL 8.5     Other pertinent labs: CPR: 2.2  ESR: 58  Imaging/Diagnostic Tests: No new imaging.   Lennie Bleacher  G, DO 10/30/2023, 7:36 AM  PGY-1, [facility] FPTS Intern pager: [phone], text pages welcome Secure chat group [facility] Teaching Service

## 2023-10-30 NOTE — Progress Notes (Addendum)
 Physical Therapy Discharge note Patient Details Name: Anthony [name] MRN: [id number] DOB: [birth year] Today's Date: 10/30/2023   History of Present Illness Pt is a 53 y.o. male admitted 10/3 for pressure wound, found to be septic.PMH: spina bifida, recurrent decubitus ulcer, septic arthritis of bilateral hips, chronic osteomyelitis, DVT, UTI    PT Comments  Goals Met. Pt currently with no dizziness with supine<>sitting/positional changes and negative for L anterior canal testing. Pt was provided with Epley for L anterior canal and [name]'s if symptoms re-appear. Currently pt is presenting at baseline level of functioning and no skilled physical therapy services recommended. Pt will be discharged from skilled physical therapy services at this time; please re-consult if further needs arise.       If plan is discharge home, recommend the following: Assist for transportation;Assistance with cooking/housework   Can travel by private vehicle     No  Equipment Recommendations  Wheelchair cushion (measurements PT);Wheelchair (measurements PT)       Precautions / Restrictions Precautions Precautions: Fall Recall of Precautions/Restrictions: Intact Restrictions Weight Bearing Restrictions Per Provider Order: No     Mobility  Bed Mobility Overal bed mobility: Modified Independent Bed Mobility: Supine to Sit, Sit to Supine     Supine to sit: Modified independent (Device/Increase time), Used rails Sit to supine: Modified independent (Device/Increase time), Used rails   General bed mobility comments: Pt performed to check R anterior canal        Balance Overall balance assessment: No apparent balance deficits (not formally assessed)      Communication Communication Communication: No apparent difficulties  Cognition Arousal: Alert Behavior During Therapy: WFL for tasks assessed/performed   PT - Cognitive impairments: No apparent impairments     Following commands:  Intact      Cueing Cueing Techniques: Verbal cues     General Comments General comments (skin integrity, edema, etc.): Pt negative for L anterior canal BPPV and reports no dizziness any any positional changes from supine<> sitting. Pt was provided with Epley for R anterior canal if it returns and [name] Exercises. pt was provided with verbal instructions and states understanding.      Pertinent Vitals/Pain Pain Assessment Pain Assessment: No/denies pain     PT Goals (current goals can now be found in the care plan section) Acute Rehab PT Goals Patient Stated Goal: decr dizziness PT Goal Formulation: With patient Time For Goal Achievement: 11/12/23 Potential to Achieve Goals: Fair Additional Goals Additional Goal #2: Pt to have negative testing all canals for BPPV. - Goal met 10/10 Progress towards PT goals: Goals met/education completed, patient discharged from PT    Frequency    Min 1X/week      PT Plan  Pt will be discharged from skilled physical therapy services; will benefit from mobility   AM-PAC PT 6 Clicks Mobility   Outcome Measure  Help needed turning from your back to your side while in a flat bed without using bedrails?: A Little Help needed moving from lying on your back to sitting on the side of a flat bed without using bedrails?: A Little Help needed moving to and from a bed to a chair (including a wheelchair)?: A Little Help needed standing up from a chair using your arms (e.g., wheelchair or bedside chair)?: Total Help needed to walk in hospital room?: Total Help needed climbing 3-5 steps with a railing? : Total 6 Click Score: 12    End of Session   Activity Tolerance: Patient tolerated treatment  well Patient left: in bed;with call bell/phone within reach Nurse Communication: Mobility status PT Visit Diagnosis: BPPV;Dizziness and giddiness (R42) BPPV - Right/Left : Left     Time: 8778-8766 PT Time Calculation (min) (ACUTE ONLY): 12  min  Charges:    $Therapeutic Activity: 8-22 mins PT General Charges $$ ACUTE PT VISIT: 1 Visit                     Dorothyann Maier, DPT, CLT  Acute Rehabilitation Services Office: [phone] (Secure chat preferred)    Dorothyann VEAR Maier 10/30/2023, 12:38 PM

## 2023-10-30 NOTE — Plan of Care (Signed)
  Problem: Education: Goal: Knowledge of General Education information will improve Description: Including pain rating scale, medication(s)/side effects and non-pharmacologic comfort measures Outcome: Progressing   Problem: Health Behavior/Discharge Planning: Goal: Ability to manage health-related needs will improve Outcome: Progressing   Problem: Clinical Measurements: Goal: Ability to maintain clinical measurements within normal limits will improve Outcome: Progressing Goal: Will remain free from infection Outcome: Progressing Goal: Diagnostic test results will improve Outcome: Progressing Goal: Respiratory complications will improve Outcome: Progressing Goal: Cardiovascular complication will be avoided Outcome: Progressing   Problem: Coping: Goal: Level of anxiety will decrease Outcome: Progressing   Problem: Nutrition: Goal: Adequate nutrition will be maintained Outcome: Progressing   Problem: Elimination: Goal: Will not experience complications related to bowel motility Outcome: Progressing Goal: Will not experience complications related to urinary retention Outcome: Progressing   Problem: Pain Managment: Goal: General experience of comfort will improve and/or be controlled Outcome: Progressing

## 2023-10-30 NOTE — Consult Note (Signed)
 WOC Nurse Consult Note: Reason for Consult: wound care orders   WOC consult reviewed along with prior consult note dated 10/4.  Discussed with bedside nurse regarding technique for wound care.  Current wound care orders are appropriate, however will specify the need to use Kerlix gauze for packing due to size and depth of wound to eliminate any misunderstanding from bedside caregivers.  Wound care as follows:  Cleanse L ischial wound with Vashe wound cleanser Soila [id number]) do not rinse and allow to air dry.  Using a Q tip applicator apply Vashe moistened Kerlix gauze to wound bed 2 times daily and prn soiling, pack into any tunneling and undermining areas. Cover with silicone or ABD pad whichever is preferred.  Cleanse sacral wound with Vashe, do not rinse.  Apply 1/4 thick layer of Santyl  to wound bed, using a Q tip applicator insert saline moistened Kerlix gauze into any depth, cover with dry gauze and silicone foam or ABD pad whichever is preferred.   WOC team will not follow patient at this time, please re consult if new needs arise.   Thank you,  Doyal Polite, MSN, RN, [facility] WOC Team [phone] (Available Mon-Fri [phone])

## 2023-10-30 NOTE — Assessment & Plan Note (Addendum)
 Afebrile, hemodynamically stable.  CRP 2.2, ESR 58.  Vitamin D  33.73. - Ortho consulted, appreciate recommendations   - No intervention at this time  - Air mattress   - daily Vashe dressing changes  - ID consulted, appreciate recommendations   - CRP, ESR both elevated (2.2 and 58, on ABX)   - wound culture: culture showed rare enterococcus faecalis   - gram stain: no WBC, no organisms seen  - Antibiotics for 6 weeks  - RD consulted, appreciate recommendation   - Add Ensure Max BID  - Wound healing labs: vitamin A , C, and zinc  pending   - Multivitamin with minerals - Wound care consulted  - Patient needs additional packing of his wounds as they are deep - Continue Cefepime  q8h, Daptomycin  800 mg (started 10/9), Metronidazole  q12h (10/3 - 11/14) narrow as able.  - Linezolid  stopped 10/9.  - Tylenol  650mg  q6h PRN - PT/OT eval and treat

## 2023-10-30 NOTE — Assessment & Plan Note (Signed)
 Iron  deficiency - continue home ferrous sulfate 325 mg daily PVCs - continue home Flecanide 100 mg BID HTN - continue home lisinopril -hydrochlorothiazide  20-25 mg and metoprolol  succinate 50 mg daily HLD - continue home rosuvastatin  40 mg daily  Suprapubic catheter - stable Colostomy - stable

## 2023-10-30 NOTE — Plan of Care (Signed)
  Problem: Education: Goal: Knowledge of General Education information will improve Description: Including pain rating scale, medication(s)/side effects and non-pharmacologic comfort measures Outcome: Progressing   Problem: Clinical Measurements: Goal: Ability to maintain clinical measurements within normal limits will improve Outcome: Progressing Goal: Diagnostic test results will improve Outcome: Progressing   Problem: Activity: Goal: Risk for activity intolerance will decrease Outcome: Progressing   Problem: Pain Managment: Goal: General experience of comfort will improve and/or be controlled Outcome: Progressing

## 2023-10-30 NOTE — Assessment & Plan Note (Addendum)
 Afebrile, hemodynamically stable.  - Ortho consulted, appreciate recommendations   - No intervention at this time  - Air mattress   - Daily Vashe dressing changes  - ID consulted, appreciate recommendations, signed off - Wound culture with rare enterococcus faecalis and candida - Continue Cefepime  q8h (10/3-11/14), Daptomycin  800 mg (10/9-11/20), Metronidazole  q12h (10/3 - 11/14) - 6 weeks total treatment - Fluconazole  x 7 days (10/10-10/16) added for candida coverage - RD consulted, appreciate recommendation   - Add Ensure Max BID  - Wound healing labs: vitamin A , C, and zinc  pending   - Multivitamin with minerals - Wound care consulted - Patient needs additional packing of his wounds as they are deep - Linezolid  stopped 10/9.  - Tylenol  650mg  q6h PRN - PT/OT eval and treat

## 2023-10-30 NOTE — Assessment & Plan Note (Addendum)
 Iron  deficiency - continue home ferrous sulfate 325 mg daily PVCs - continue home Flecanide 100 mg BID HTN - continue home lisinopril -hydrochlorothiazide  20-25 mg and metoprolol  succinate 50 mg daily HLD - continue home rosuvastatin  40 mg daily  Suprapubic catheter - stable Colostomy - stable

## 2023-10-30 NOTE — TOC Progression Note (Addendum)
 Transition of Care [facility]) - Progression Note    Patient Details  Name: Anthony [name] MRN: [id number] Date of Birth: 10-Sep-1970  [facility]) CM/SW Contact  Lauraine FORBES Saa, LCSWA Phone Number: 10/30/2023, 9:32 AM  Clinical Narrative:     9:32 AM [facility] SNF informed [name] that patient could potentially discharge tomorrow based on bed availability. [name] will continue to follow.  9:59 AM SNF requested three day ostomy supplies for patient's discharge. [name] made medical team aware of request.  Expected Discharge Plan: Skilled Nursing Facility Barriers to Discharge: Continued Medical Work up               Expected Discharge Plan and Services In-house Referral: Clinical Social Work   Post Acute Care Choice: Skilled Nursing Facility Living arrangements for the past 2 months: Single Family Home                                       Social Drivers of Health (SDOH) Interventions SDOH Screenings   Food Insecurity: No Food Insecurity (10/24/2023)  Housing: High Risk (10/24/2023)  Transportation Needs: Unmet Transportation Needs (10/24/2023)  Utilities: At Risk (10/24/2023)  Alcohol Screen: Low Risk  (10/15/2022)  Depression (PHQ2-9): Low Risk  (10/15/2022)  Financial Resource Strain: Low Risk  (10/15/2022)  Physical Activity: Inactive (10/15/2022)  Social Connections: Moderately Integrated (10/15/2022)  Stress: No Stress Concern Present (10/15/2022)  Tobacco Use: Medium Risk (10/23/2023)  Health Literacy: Adequate Health Literacy (10/15/2022)    Readmission Risk Interventions     No data to display

## 2023-10-30 NOTE — Progress Notes (Addendum)
 Daily Progress Note Intern Pager: [phone]  Patient name: Anthony [name] Medical record number: [id number] Date of birth: 08/05/1970 Age: 53 y.o. Gender: male  Primary Care Provider: Jerrie Gathers, DO Consultants: Orthopedic surgery, ID Code Status: Full code  Pt Overview and Major Events to Date:  10/3: Admitted  Assessment and Plan:  Anthony [name] is a 53 y.o. male with a PMH of paraplegia 2/2 spina bifida, chronic sacral ulcers, HTN, HLD, R rotator cuff tear who was admitted for sepsis, now resolved. He is now medically stable for discharge. Repeat EKG ordered this AM to check QTC, pending at this time. Assessment & Plan Unstageable pressure ulcer of sacral region (HCC) Afebrile, hemodynamically stable.  - Ortho consulted, appreciate recommendations   - No intervention at this time  - Air mattress   - Daily Vashe dressing changes  - ID consulted, appreciate recommendations, signed off - Wound culture with rare enterococcus faecalis and candida - Continue Cefepime  q8h (10/3-11/14), Daptomycin  800 mg (10/9-11/20), Metronidazole  q12h (10/3 - 11/14) - 6 weeks total treatment - Fluconazole  x 7 days (10/10-10/16) added for candida coverage - RD consulted, appreciate recommendation   - Add Ensure Max BID  - Wound healing labs: vitamin A , C, and zinc  pending   - Multivitamin with minerals - Wound care consulted - Patient needs additional packing of his wounds as they are deep - Linezolid  stopped 10/9.  - Tylenol  650mg  q6h PRN - PT/OT eval and treat Injury of right thumb Small puncture wound from incident with pruning shears at home. Received Tdap.  Chronic health problem Iron  deficiency - continue home ferrous sulfate 325 mg daily PVCs - continue home Flecanide 100 mg BID HTN - continue home lisinopril -hydrochlorothiazide  20-25 mg and metoprolol  succinate 50 mg daily HLD - continue home rosuvastatin  40 mg daily  Suprapubic catheter - stable Colostomy -  stable  FEN/GI: Regular PPx: Lovenox  Dispo:SNF today. To [facility].   Subjective:  Patient doing well this morning. Nurse at bedside this AM for wound packing. Complaining of itching from tele pads overnight so added benadryl  cream but it has not yet been applied yet. Patient states itching has improved on its own.  Objective: Temp:  [97.6 F (36.4 C)-98.7 F (37.1 C)] 98.5 F (36.9 C) (10/10 2034) Pulse Rate:  [79-92] 92 (10/10 2034) Resp:  [13-19] 13 (10/10 2034) BP: (93-116)/(53-72) 110/64 (10/10 2034) SpO2:  [93 %-96 %] 95 % (10/10 2034)  Physical Exam: General: Alert, well-appearing male in NAD.  HEENT: No sign of trauma, EOM grossly intact. Cardiovascular: RRR, no m/r/g appreciated. Pulmonary: Normal WOB. CTAB with no w/c/r present.  Abdomen: Soft, non-tender, non-distended.  Laboratory: Most recent CBC Lab Results  Component Value Date   WBC 6.9 10/29/2023   HGB 10.1 (L) 10/29/2023   HCT 33.8 (L) 10/29/2023   MCV 74.4 (L) 10/29/2023   PLT 477 (H) 10/29/2023   Most recent BMP    Latest Ref Rng & Units 10/27/2023    3:44 AM  BMP  Glucose 70 - 99 mg/dL 897   BUN 6 - 20 mg/dL 9   Creatinine 9.38 - 8.75 mg/dL 9.39   Sodium 864 - 854 mmol/L 138   Potassium 3.5 - 5.1 mmol/L 3.6   Chloride 98 - 111 mmol/L 105   CO2 22 - 32 mmol/L 24   Calcium  8.9 - 10.3 mg/dL 8.5     Anthony Gathers, DO 10/31/2023, 6:58 AM  PGY-1, [facility] Family [facility] FPTS Intern pager: [phone],  text pages welcome Secure chat group [facility] Teaching Service

## 2023-10-31 DIAGNOSIS — L309 Dermatitis, unspecified: Secondary | ICD-10-CM | POA: Insufficient documentation

## 2023-10-31 DIAGNOSIS — M8660 Other chronic osteomyelitis, unspecified site: Secondary | ICD-10-CM | POA: Diagnosis not present

## 2023-10-31 DIAGNOSIS — M8610 Other acute osteomyelitis, unspecified site: Secondary | ICD-10-CM | POA: Diagnosis not present

## 2023-10-31 MED ORDER — DIPHENHYDRAMINE-ZINC ACETATE 2-0.1 % EX CREA
TOPICAL_CREAM | Freq: Every day | CUTANEOUS | Status: DC | PRN
  Administered 2023-10-31: 1 via TOPICAL
  Filled 2023-10-31: qty 28

## 2023-10-31 MED ORDER — HYDROCORTISONE 1 % EX OINT
TOPICAL_OINTMENT | Freq: Two times a day (BID) | CUTANEOUS | Status: DC
  Filled 2023-10-31: qty 28

## 2023-10-31 MED ORDER — MELATONIN 3 MG PO TABS
3.0000 mg | ORAL_TABLET | Freq: Every day | ORAL | Status: DC
  Administered 2023-10-31 – 2023-11-01 (×3): 3 mg via ORAL
  Filled 2023-10-31 (×3): qty 1

## 2023-10-31 NOTE — Progress Notes (Signed)
 Patient is itching on his chest due to the telemetry pads, per patient. He is requesting Benadryl  and stating it also might help him sleep. Paged Family med resident to request. See orders for details.

## 2023-10-31 NOTE — Plan of Care (Signed)
   Problem: Education: Goal: Knowledge of General Education information will improve Description: Including pain rating scale, medication(s)/side effects and non-pharmacologic comfort measures Outcome: Progressing   Problem: Activity: Goal: Risk for activity intolerance will decrease Outcome: Progressing   Problem: Elimination: Goal: Will not experience complications related to bowel motility Outcome: Progressing   Problem: Skin Integrity: Goal: Risk for impaired skin integrity will decrease Outcome: Progressing

## 2023-10-31 NOTE — Assessment & Plan Note (Signed)
 Afebrile, hemodynamically stable.  - Ortho consulted, appreciate recommendations   - No intervention at this time  - Air mattress   - Daily Vashe dressing changes  - ID consulted, appreciate recommendations, signed off - Wound culture with rare enterococcus faecalis and candida - Continue Cefepime  q8h (10/3-11/14), Daptomycin  800 mg (10/9-11/20), Metronidazole  q12h (10/3 - 11/14) - 6 weeks total treatment (linezolid  stopped 10/9) - Fluconazole  x 7 days (10/10-10/16) added for candida coverage - RD consulted, appreciate recommendation   - Ensure Max BID  - Multivitamin with minerals - Wound care consulted - Continue additional packing of his wounds as they are deep - Tylenol  650mg  q6h PRN for pain - PT/OT eval and treat

## 2023-10-31 NOTE — Progress Notes (Incomplete)
     Daily Progress Note Intern Pager: [phone]  Patient name: Anthony [name] Medical record number: [id number] Date of birth: 19-Jun-1970 Age: 53 y.o. Gender: male  Primary Care Provider: Jerrie Gathers, DO Consultants: Orthopedic surgery, ID Code Status: Full code  Pt Overview and Major Events to Date:  10/3-admitted  Medical Decision Making:  COULSON WEHNER is a 53 y.o. male with a PMH of paraplegia 2/2 spina bifida, chronic sacral ulcers, HTN, HLD, R rotator cuff tear who was admitted for sepsis, now resolved. He is now medically stable for discharge. Assessment & Plan Unstageable pressure ulcer of sacral region (HCC) Afebrile, hemodynamically stable.  - Ortho consulted, appreciate recommendations   - No intervention at this time  - Air mattress   - Daily Vashe dressing changes  - ID consulted, appreciate recommendations, signed off - Wound culture with rare enterococcus faecalis and candida - Continue Cefepime  q8h (10/3-11/14), Daptomycin  800 mg (10/9-11/20), Metronidazole  q12h (10/3 - 11/14) - 6 weeks total treatment (linezolid  stopped 10/9) - Fluconazole  x 7 days (10/10-10/16) added for candida coverage - RD consulted, appreciate recommendation   - Ensure Max BID  - Multivitamin with minerals - Wound care consulted - Continue additional packing of his wounds as they are deep - Tylenol  650mg  q6h PRN for pain - PT/OT eval and treat Dermatitis Of telemetry leads.  Discontinue on 10/10. -Continue hydrocortisone twice daily for 7 days (end 10/18)   FEN/GI: Regular PPx: Lovenox  Dispo:SNF tomorrow. Barriers include bed availability.   Subjective:  ***  Objective: Temp:  [97.8 F (36.6 C)-98.9 F (37.2 C)] 98.7 F (37.1 C) (10/11 1918) Pulse Rate:  [81-96] 91 (10/11 1918) Resp:  [19-20] 20 (10/11 1918) BP: (99-124)/(60-77) 108/77 (10/11 1918) SpO2:  [94 %-98 %] 94 % (10/11 1918) Physical Exam: General: *** Cardiovascular: *** Respiratory: *** Abdomen:  *** Extremities: ***  Laboratory: Most recent CBC Lab Results  Component Value Date   WBC 6.9 10/29/2023   HGB 10.1 (L) 10/29/2023   HCT 33.8 (L) 10/29/2023   MCV 74.4 (L) 10/29/2023   PLT 477 (H) 10/29/2023   Most recent BMP    Latest Ref Rng & Units 10/27/2023    3:44 AM  BMP  Glucose 70 - 99 mg/dL 897   BUN 6 - 20 mg/dL 9   Creatinine 9.38 - 8.75 mg/dL 9.39   Sodium 864 - 854 mmol/L 138   Potassium 3.5 - 5.1 mmol/L 3.6   Chloride 98 - 111 mmol/L 105   CO2 22 - 32 mmol/L 24   Calcium  8.9 - 10.3 mg/dL 8.5    Tharon Lung, MD 10/31/2023, 9:56 PM  PGY-3, [place] [facility] FPTS Intern pager: [phone], text pages welcome Secure chat group [facility] Teaching Service

## 2023-10-31 NOTE — Assessment & Plan Note (Signed)
 Of telemetry leads.  Discontinue on 10/10. -Continue hydrocortisone twice daily for 7 days (end 10/18), atarax  25 mg TID prn itching

## 2023-10-31 NOTE — TOC Progression Note (Addendum)
 Transition of Care [facility]) - Progression Note    Patient Details  Name: Anthony [name] MRN: [id number] Date of Birth: November 07, 1970  Transition of Care [facility]) CM/SW Contact  Isaiah Public, LCSWA Phone Number: 10/31/2023, 2:01 PM  Clinical Narrative:     MD informed [name] that patient medically stable for dc.SNF informed [name] that patient could potentially discharge today based on bed availability. [name] informed MD.[name] will continue to follow.   Update- Kia with [facility] confirmed no bed availability today. [name] informed MD.  Expected Discharge Plan: Skilled Nursing Facility Barriers to Discharge: Continued Medical Work up               Expected Discharge Plan and Services In-house Referral: Clinical Social Work   Post Acute Care Choice: Skilled Nursing Facility Living arrangements for the past 2 months: Single Family Home                                       Social Drivers of Health (SDOH) Interventions SDOH Screenings   Food Insecurity: No Food Insecurity (10/24/2023)  Housing: High Risk (10/24/2023)  Transportation Needs: Unmet Transportation Needs (10/24/2023)  Utilities: At Risk (10/24/2023)  Alcohol Screen: Low Risk  (10/15/2022)  Depression (PHQ2-9): Low Risk  (10/15/2022)  Financial Resource Strain: Low Risk  (10/15/2022)  Physical Activity: Inactive (10/15/2022)  Social Connections: Moderately Integrated (10/15/2022)  Stress: No Stress Concern Present (10/15/2022)  Tobacco Use: Medium Risk (10/23/2023)  Health Literacy: Adequate Health Literacy (10/15/2022)    Readmission Risk Interventions     No data to display

## 2023-11-01 DIAGNOSIS — M869 Osteomyelitis, unspecified: Secondary | ICD-10-CM | POA: Diagnosis not present

## 2023-11-01 LAB — ZINC: Zinc: 53 ug/dL (ref 44–115)

## 2023-11-01 LAB — VITAMIN C: Vitamin C: 0.6 mg/dL (ref 0.4–2.0)

## 2023-11-01 MED ORDER — HYDROXYZINE HCL 25 MG PO TABS
25.0000 mg | ORAL_TABLET | Freq: Three times a day (TID) | ORAL | Status: DC | PRN
Start: 2023-11-01 — End: 2023-11-03
  Administered 2023-11-01 (×2): 25 mg via ORAL
  Filled 2023-11-01 (×2): qty 1

## 2023-11-01 MED ORDER — SODIUM CHLORIDE 0.9 % IV SOLN
100.0000 mg | INTRAVENOUS | Status: DC
  Administered 2023-11-01 – 2023-11-02 (×2): 100 mg via INTRAVENOUS
  Filled 2023-11-01 (×2): qty 5

## 2023-11-01 NOTE — Plan of Care (Signed)
  Problem: Education: Goal: Knowledge of General Education information will improve Description: Including pain rating scale, medication(s)/side effects and non-pharmacologic comfort measures Outcome: Progressing   Problem: Activity: Goal: Risk for activity intolerance will decrease Outcome: Progressing   Problem: Nutrition: Goal: Adequate nutrition will be maintained Outcome: Progressing   Problem: Elimination: Goal: Will not experience complications related to bowel motility Outcome: Progressing Goal: Will not experience complications related to urinary retention Outcome: Progressing   Problem: Skin Integrity: Goal: Risk for impaired skin integrity will decrease Outcome: Progressing   

## 2023-11-01 NOTE — TOC Progression Note (Addendum)
 Transition of Care [facility]) - Initial/Assessment Note    Patient Details  Name: Anthony [name] MRN: [id number] Date of Birth: 04/15/1970  Transition [facility]) CM/SW Contact:    Britt JULIANNA Bennetts, LCSW Phone Number: 11/01/2023, 8:35 AM  Clinical Narrative:                 08:36- [name] contacted Kia with [facility] to inquire as to whether the facility has a bed available for the patient today and is awaiting a response.    08:45- [name] informed that facility does not have a bed available for patient today.  TOC will continue to follow.  Expected Discharge Plan: Skilled Nursing Facility Barriers to Discharge: Continued Medical Work up   Patient Goals and CMS Choice Patient states their goals for this hospitalization and ongoing recovery are:: SNF          Expected Discharge Plan and Services In-house Referral: Clinical Social Work   Post Acute Care Choice: Skilled Nursing Facility Living arrangements for the past 2 months: Single Family Home                                      Prior Living Arrangements/Services Living arrangements for the past 2 months: Single Family Home Lives with:: Self Patient language and need for interpreter reviewed:: Yes        Need for Family Participation in Patient Care: No (Comment) Care giver support system in place?: No (comment) Current home services: DME Criminal Activity/Legal Involvement Pertinent to Current Situation/Hospitalization: No - Comment as needed  Activities of Daily Living   ADL Screening (condition at time of admission) Independently performs ADLs?: No Does the patient have a NEW difficulty with bathing/dressing/toileting/self-feeding that is expected to last >3 days?: No Does the patient have a NEW difficulty with getting in/out of bed, walking, or climbing stairs that is expected to last >3 days?: No Does the patient have a NEW difficulty with communication that is expected to last >3 days?: No Is the patient deaf or  have difficulty hearing?: No Does the patient have difficulty seeing, even when wearing glasses/contacts?: No Does the patient have difficulty concentrating, remembering, or making decisions?: No  Permission Sought/Granted Permission sought to share information with : Family Supports, Oceanographer granted to share information with : No (Contact information on chart)  Share Information with NAME: Arland Degree  Permission granted to share info w AGENCY: SNF  Permission granted to share info w Relationship: Friend  Permission granted to share info w Contact Information: [phone]  Emotional Assessment Appearance:: Appears stated age Attitude/Demeanor/Rapport: Engaged Affect (typically observed): Accepting, Appropriate, Adaptable, Calm, Stable, Pleasant Orientation: : Oriented to Self, Oriented to Place, Oriented to  Time, Oriented to Situation Alcohol / Substance Use: Not Applicable Psych Involvement: No (comment)  Admission diagnosis:  Acute cystitis without hematuria [N30.00] Wound of sacral region, initial encounter [S31.000A] Sepsis (HCC) [A41.9] History of suprapubic catheter [Z98.890] Spina bifida, unspecified hydrocephalus presence, unspecified spinal region (HCC) [Q05.9] Sepsis without acute organ dysfunction, due to unspecified organism [facility]) [A41.9] Patient Active Problem List   Diagnosis Date Noted   Dermatitis 10/31/2023   Acute on chronic osteomyelitis (HCC) 10/29/2023   Sacral wound 10/27/2023   Osteomyelitis, pelvic region and thigh (HCC) 10/27/2023   Chronic health problem 10/24/2023   Unstageable pressure ulcer of sacral region (HCC) 10/24/2023   Injury of right thumb 10/24/2023  History of suprapubic catheter 10/24/2023   Thrombophilia 09/08/2022   Sepsis without acute organ dysfunction (HCC) 09/07/2022   Advanced Directives in ACP section 09/07/2022   Polymicrobial bacterial infection 08/28/2022   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  08/16/2022   Pressure injury, stage 4, with infection (HCC) 08/05/2022   Acute deep vein thrombosis (DVT) of axillary vein of right upper extremity (HCC) 08/02/2022   Anemia of infection and chronic disease 07/26/2022   Chronic osteomyelitis of pelvic region (HCC) 07/26/2022   Febrile illness 02/27/2022   Pyogenic arthritis of left hip (HCC)    Septic arthritis of bilateral hips (HCC) 09/03/2021   Neurogenic bladder 03/06/2017   Suprapubic catheter (HCC) 03/06/2017   Paraplegia (HCC) 01/29/2017   Decubitus ulcer due to spina bifida (HCC) 01/27/2017   Hyperlipidemia 03/11/2014   Spina bifida (HCC) 09/30/2012   Acute Right shoulder pain 01/27/2011   Allergic rhinitis 06/01/2007   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   VENOUS INSUFFICIENCY, CHRONIC 03/19/2006   PCP:  Jerrie Gathers, DO Pharmacy:   [id number] - [place] - [address] [place] ([place]) [place] [address] Phone: [phone] Fax: [phone]     Social Drivers of Health (SDOH) Social History: SDOH Screenings   Food Insecurity: No Food Insecurity (10/24/2023)  Housing: High Risk (10/24/2023)  Transportation Needs: Unmet Transportation Needs (10/24/2023)  Utilities: At Risk (10/24/2023)  Alcohol Screen: Low Risk  (10/15/2022)  Depression (PHQ2-9): Low Risk  (10/15/2022)  Financial Resource Strain: Low Risk  (10/15/2022)  Physical Activity: Inactive (10/15/2022)  Social Connections: Moderately Integrated (10/15/2022)  Stress: No Stress Concern Present (10/15/2022)  Tobacco Use: Medium Risk (10/23/2023)  Health Literacy: Adequate Health Literacy (10/15/2022)   SDOH Interventions: Housing Interventions: Programmer, applications Provided, Inpatient [employer] Transportation Interventions: [employer] Provided, Inpatient TOC, [facility] ([facility]) Utilities Interventions: [employer] Provided, Inpatient TOC   Readmission Risk Interventions     No data to  display

## 2023-11-01 NOTE — Progress Notes (Signed)
 ID PROGRESS NOTE  52yo M with sacral osteomyelitis, polymicrobial including candidal species. Primary team asked whether fluconazole  is still good option given non-albicans candida species.  Current abtx: Daptomycinc 8mg /kg Cefepime  Metronidazole  Fluconazole  400mg  (D3)   Micro results: 10/7  RARE ENTEROCOCCUS FAECALIS VANCOMYCIN RESISTANT ENTEROCOCCUS ISOLATED RARE CANDIDA SPECIES, NOT ALBICANS NO ANAEROBES ISOLATED; CULTURE IN PROGRESS FOR 5 DAYS   Report Status PENDING  Organism ID, Bacteria ENTEROCOCCUS FAECALIS   A/P: Switch out fluconazole  for micafungin  100mg  iv daily for now Await for identification of candidal species then can decide to keep micafungin  vs. Can change back to fluconazole . Remember to get weekly cmp, sed rate and crp Please call back ID team when candidal species identified.  Montie FURY Luiz MD MPH [facility] [phone]

## 2023-11-01 NOTE — Plan of Care (Signed)
   Problem: Health Behavior/Discharge Planning: Goal: Ability to manage health-related needs will improve Outcome: Progressing   Problem: Clinical Measurements: Goal: Ability to maintain clinical measurements within normal limits will improve Outcome: Progressing   Problem: Clinical Measurements: Goal: Will remain free from infection Outcome: Progressing

## 2023-11-01 NOTE — Progress Notes (Signed)
 Patient asking for something for itching. He is itching from waist up, worse on palms of hands and chest. His description is just crawling everywhere. He does say this happens from time to time and he does not know why or what triggers. I applied hydrocortisone cream to chest at 2300. Will page FMTS.

## 2023-11-02 DIAGNOSIS — A419 Sepsis, unspecified organism: Secondary | ICD-10-CM | POA: Diagnosis not present

## 2023-11-02 DIAGNOSIS — S31000A Unspecified open wound of lower back and pelvis without penetration into retroperitoneum, initial encounter: Secondary | ICD-10-CM | POA: Diagnosis not present

## 2023-11-02 DIAGNOSIS — Z7409 Other reduced mobility: Secondary | ICD-10-CM | POA: Diagnosis not present

## 2023-11-02 DIAGNOSIS — Z792 Long term (current) use of antibiotics: Secondary | ICD-10-CM | POA: Diagnosis not present

## 2023-11-02 DIAGNOSIS — Z9359 Other cystostomy status: Secondary | ICD-10-CM | POA: Diagnosis not present

## 2023-11-02 DIAGNOSIS — M8610 Other acute osteomyelitis, unspecified site: Secondary | ICD-10-CM | POA: Diagnosis not present

## 2023-11-02 DIAGNOSIS — J329 Chronic sinusitis, unspecified: Secondary | ICD-10-CM | POA: Diagnosis not present

## 2023-11-02 DIAGNOSIS — M86452 Chronic osteomyelitis with draining sinus, left femur: Secondary | ICD-10-CM | POA: Diagnosis not present

## 2023-11-02 DIAGNOSIS — L89310 Pressure ulcer of right buttock, unstageable: Secondary | ICD-10-CM | POA: Diagnosis not present

## 2023-11-02 DIAGNOSIS — L089 Local infection of the skin and subcutaneous tissue, unspecified: Secondary | ICD-10-CM | POA: Diagnosis not present

## 2023-11-02 DIAGNOSIS — G8220 Paraplegia, unspecified: Secondary | ICD-10-CM | POA: Diagnosis not present

## 2023-11-02 DIAGNOSIS — R5381 Other malaise: Secondary | ICD-10-CM | POA: Diagnosis not present

## 2023-11-02 DIAGNOSIS — M6281 Muscle weakness (generalized): Secondary | ICD-10-CM | POA: Diagnosis not present

## 2023-11-02 DIAGNOSIS — Z7401 Bed confinement status: Secondary | ICD-10-CM | POA: Diagnosis not present

## 2023-11-02 DIAGNOSIS — R531 Weakness: Secondary | ICD-10-CM | POA: Diagnosis not present

## 2023-11-02 DIAGNOSIS — L89154 Pressure ulcer of sacral region, stage 4: Secondary | ICD-10-CM | POA: Diagnosis not present

## 2023-11-02 DIAGNOSIS — M8660 Other chronic osteomyelitis, unspecified site: Secondary | ICD-10-CM | POA: Diagnosis not present

## 2023-11-02 DIAGNOSIS — N319 Neuromuscular dysfunction of bladder, unspecified: Secondary | ICD-10-CM | POA: Diagnosis not present

## 2023-11-02 DIAGNOSIS — G4700 Insomnia, unspecified: Secondary | ICD-10-CM | POA: Diagnosis not present

## 2023-11-02 DIAGNOSIS — L89224 Pressure ulcer of left hip, stage 4: Secondary | ICD-10-CM | POA: Diagnosis not present

## 2023-11-02 DIAGNOSIS — K769 Liver disease, unspecified: Secondary | ICD-10-CM | POA: Diagnosis not present

## 2023-11-02 DIAGNOSIS — E785 Hyperlipidemia, unspecified: Secondary | ICD-10-CM | POA: Diagnosis not present

## 2023-11-02 DIAGNOSIS — Q059 Spina bifida, unspecified: Secondary | ICD-10-CM | POA: Diagnosis not present

## 2023-11-02 DIAGNOSIS — L89894 Pressure ulcer of other site, stage 4: Secondary | ICD-10-CM | POA: Diagnosis not present

## 2023-11-02 DIAGNOSIS — E43 Unspecified severe protein-calorie malnutrition: Secondary | ICD-10-CM | POA: Diagnosis not present

## 2023-11-02 DIAGNOSIS — Q057 Lumbar spina bifida without hydrocephalus: Secondary | ICD-10-CM | POA: Diagnosis not present

## 2023-11-02 DIAGNOSIS — M009 Pyogenic arthritis, unspecified: Secondary | ICD-10-CM | POA: Diagnosis not present

## 2023-11-02 DIAGNOSIS — L98418 Non-pressure chronic ulcer of buttock with other specified severity: Secondary | ICD-10-CM | POA: Diagnosis not present

## 2023-11-02 DIAGNOSIS — M869 Osteomyelitis, unspecified: Secondary | ICD-10-CM | POA: Diagnosis not present

## 2023-11-02 DIAGNOSIS — L89324 Pressure ulcer of left buttock, stage 4: Secondary | ICD-10-CM | POA: Diagnosis not present

## 2023-11-02 DIAGNOSIS — L309 Dermatitis, unspecified: Secondary | ICD-10-CM | POA: Diagnosis not present

## 2023-11-02 DIAGNOSIS — I10 Essential (primary) hypertension: Secondary | ICD-10-CM | POA: Diagnosis not present

## 2023-11-02 LAB — CBC
HCT: 35.5 % — ABNORMAL LOW (ref 39.0–52.0)
Hemoglobin: 10.8 g/dL — ABNORMAL LOW (ref 13.0–17.0)
MCH: 22.7 pg — ABNORMAL LOW (ref 26.0–34.0)
MCHC: 30.4 g/dL (ref 30.0–36.0)
MCV: 74.6 fL — ABNORMAL LOW (ref 80.0–100.0)
Platelets: 435 K/uL — ABNORMAL HIGH (ref 150–400)
RBC: 4.76 MIL/uL (ref 4.22–5.81)
RDW: 19.8 % — ABNORMAL HIGH (ref 11.5–15.5)
WBC: 12 K/uL — ABNORMAL HIGH (ref 4.0–10.5)
nRBC: 0.2 % (ref 0.0–0.2)

## 2023-11-02 LAB — BASIC METABOLIC PANEL WITH GFR
Anion gap: 10 (ref 5–15)
BUN: 33 mg/dL — ABNORMAL HIGH (ref 6–20)
CO2: 26 mmol/L (ref 22–32)
Calcium: 9.4 mg/dL (ref 8.9–10.3)
Chloride: 100 mmol/L (ref 98–111)
Creatinine, Ser: 0.69 mg/dL (ref 0.61–1.24)
GFR, Estimated: >60 mL/min (ref >60)
Glucose, Bld: 104 mg/dL — ABNORMAL HIGH (ref 70–99)
Potassium: 4.4 mmol/L (ref 3.5–5.1)
Sodium: 136 mmol/L (ref 135–145)

## 2023-11-02 LAB — CK: Total CK: 19 U/L — ABNORMAL LOW (ref 49–397)

## 2023-11-02 LAB — VITAMIN A: Vitamin A (Retinoic Acid): 72 ug/dL — ABNORMAL HIGH (ref 20.1–62.0)

## 2023-11-02 MED ORDER — ENSURE PLUS HIGH PROTEIN PO LIQD: 237.0000 mL | Freq: Two times a day (BID) | ORAL | Status: AC

## 2023-11-02 MED ORDER — HYDROXYZINE HCL 25 MG PO TABS: 25.0000 mg | ORAL_TABLET | Freq: Three times a day (TID) | ORAL | Status: AC | PRN

## 2023-11-02 MED ORDER — CEFEPIME IV (FOR PTA / DISCHARGE USE ONLY): 2.0000 g | Freq: Three times a day (TID) | INTRAVENOUS | Status: AC

## 2023-11-02 MED ORDER — DAPTOMYCIN IV (FOR PTA / DISCHARGE USE ONLY): 800.0000 mg | INTRAVENOUS | Status: AC

## 2023-11-02 MED ORDER — HEPARIN SOD (PORK) LOCK FLUSH 100 UNIT/ML IV SOLN
250.0000 [IU] | INTRAVENOUS | Status: AC | PRN
  Administered 2023-11-02: 250 [IU]

## 2023-11-02 MED ORDER — COLLAGENASE 250 UNIT/GM EX OINT: TOPICAL_OINTMENT | Freq: Every day | CUTANEOUS | Status: AC

## 2023-11-02 MED ORDER — METRONIDAZOLE 500 MG PO TABS: 500.0000 mg | ORAL_TABLET | Freq: Two times a day (BID) | ORAL | Status: AC

## 2023-11-02 MED ORDER — DIPHENHYDRAMINE-ZINC ACETATE 2-0.1 % EX CREA: TOPICAL_CREAM | Freq: Every day | CUTANEOUS | Status: AC | PRN

## 2023-11-02 MED ORDER — HEPARIN SOD (PORK) LOCK FLUSH 100 UNIT/ML IV SOLN
500.0000 [IU] | Freq: Once | INTRAVENOUS | Status: DC
  Filled 2023-11-02: qty 5

## 2023-11-02 MED ORDER — JUVEN PO PACK: 1.0000 | PACK | Freq: Two times a day (BID) | ORAL | Status: AC

## 2023-11-02 MED ORDER — ZINC OXIDE 40 % EX OINT: TOPICAL_OINTMENT | Freq: Two times a day (BID) | CUTANEOUS | Status: AC

## 2023-11-02 MED ORDER — SODIUM CHLORIDE 0.9 % IV SOLN: INTRAVENOUS | 0 refills | Status: DC

## 2023-11-02 MED ORDER — HYDROCORTISONE 1 % EX OINT: TOPICAL_OINTMENT | Freq: Two times a day (BID) | CUTANEOUS | Status: AC

## 2023-11-02 MED ORDER — SODIUM CHLORIDE 0.9 % IV SOLN: INTRAVENOUS | Status: DC

## 2023-11-02 MED ORDER — SODIUM CHLORIDE 0.9 % IV SOLN: INTRAVENOUS | 0 refills | Status: AC

## 2023-11-02 NOTE — Progress Notes (Signed)
 Called [facility] and gave report to LPN Tiffany. All questions answered.

## 2023-11-02 NOTE — Plan of Care (Signed)

## 2023-11-02 NOTE — Assessment & Plan Note (Deleted)
 Afebrile, hemodynamically stable.  - Ortho consulted, appreciate recommendations   - No intervention at this time  - Air mattress   - Daily Vashe dressing changes  - ID consulted, appreciate recommendations, signed off - Wound culture with rare enterococcus faecalis and candida - Continue Cefepime  q8h (10/3-11/14), Daptomycin  800 mg (10/9-11/20), Metronidazole  q12h (10/3 - 11/14) - 6 weeks total treatment (linezolid  stopped 10/9) - Fluconazole  x 7 days (10/10-10/16) added for candida coverage  - Secure chatted ID Dr. Luiz, switched patient to micafungin  yesterday for better coverage - Candida speciated as Candida orthopsilosis, will reach out to ID today to ensure antifungal agent is appropriate  - RD consulted, appreciate recommendation   - Ensure Max BID  - Multivitamin with minerals - Wound care consulted - Continue additional packing of his wounds as they are deep - Tylenol  650mg  q6h PRN for pain - PT/OT eval and treat

## 2023-11-02 NOTE — Assessment & Plan Note (Deleted)
 Of telemetry leads.  Discontinue on 10/10. -Continue hydrocortisone twice daily for 7 days (end 10/18), atarax  25 mg TID prn itching

## 2023-11-02 NOTE — TOC Transition Note (Addendum)
 Transition of Care [facility]) - Discharge Note   Patient Details  Name: Anthony [name] MRN: [id number] Date of Birth: September 30, 1970  Transition of Care [facility]) CM/SW Contact:  Lauraine FORBES Saa, LCSWA Phone Number: 11/02/2023, 2:45 PM   Clinical Narrative:     Patient will DC to: [facility] SNF Anticipated DC date: 11/02/2023 Family notified: N/A Transport by: [place]   Per MD patient ready for DC to [facility] SNF. RN to call report prior to discharge [phone]). RN, patient, and facility notified of DC. Discharge Summary and FL2 sent to facility. DC packet on chart. Patient declined [name] offer of informing friends/family of discharge. Ambulance transport requested for patient at 14:44.   [name] will sign off for now as social work intervention is no longer needed. Please consult us  again if new needs arise.      Barriers to Discharge: Barriers Resolved   Patient Goals and CMS Choice Patient states their goals for this hospitalization and ongoing recovery are:: SNF          Discharge Placement              Patient [facility] bed at: [facility] Patient to be transferred to facility by: [facility] Name of family member notified: N/A Patient and family notified of of transfer: 11/02/23  Discharge Plan and Services Additional resources added to the After Visit Summary for   In-house Referral: Clinical Social Work   Post Acute Care Choice: Skilled Nursing Facility                               Social Drivers of Health (SDOH) Interventions SDOH Screenings   Food Insecurity: No Food Insecurity (10/24/2023)  Housing: High Risk (10/24/2023)  Transportation Needs: Unmet Transportation Needs (10/24/2023)  Utilities: At Risk (10/24/2023)  Alcohol Screen: Low Risk  (10/15/2022)  Depression (PHQ2-9): Low Risk  (10/15/2022)  Financial Resource Strain: Low Risk  (10/15/2022)  Physical Activity: Inactive (10/15/2022)  Social Connections: Moderately Integrated  (10/15/2022)  Stress: No Stress Concern Present (10/15/2022)  Tobacco Use: Medium Risk (10/23/2023)  Health Literacy: Adequate Health Literacy (10/15/2022)     Readmission Risk Interventions     No data to display

## 2023-11-02 NOTE — Progress Notes (Signed)
 PHARMACY CONSULT NOTE FOR:  OUTPATIENT  PARENTERAL ANTIBIOTIC THERAPY (OPAT)  Indication: Sacral osteomyelitis  Regimen: Daptomycin  IV 800 mg daily + cefepime  IV 2g every 8 hours + metronidazole  PO 500 mg 2 times daily End date: 12/04/2023  Update 11/02/2023 - Micafungin  order added to plan per ID  - Micafungin  IV 100 mg daily x2 weeks (End of therapy: 11/15/2023)   IV antibiotic discharge orders are pended. To discharging provider:  please sign these orders via discharge navigator,  Select New Orders & click on the button choice - Manage This Unsigned Work.    Thank you for allowing pharmacy to be a part of this patient's care.  Feliciano Close, PharmD PGY2 Infectious Diseases Pharmacy Resident  11/02/2023 1:37 PM

## 2023-11-02 NOTE — Discharge Summary (Addendum)
 Family Medicine Teaching [facility] Discharge Summary  Patient name: Anthony [name] Medical record number: [id number] Date of birth: 1970/06/25 Age: 53 y.o. Gender: male Date of Admission: 10/23/2023  Date of Discharge: 11/02/2023 Admitting Physician: Raguel KANDICE Lee, DO  Primary Care Provider: Jerrie Gathers, DO Consultants: Orthopedic surgery, ID  Indication for Hospitalization: Sepsis   Discharge Diagnoses/Problem List:  Principal Problem for Admission: Unstageable pressure ulcer of sacral region with sepsis  Other Problems addressed during stay:  Active Problems:   Spina bifida (HCC)   Sepsis without acute organ dysfunction (HCC)   Unstageable pressure ulcer of sacral region [facility])   Injury of right thumb    Suprapubic catheter    Colostomy in place    Dermatitis   Brief Hospital Course:  Anthony [name] is a 53 y.o.male with a history of paraplegia secondary to spina bifida, chronic sacral ulcers, indwelling suprapubic catheter, and anxiety who was admitted to the family medicine teaching Service at [facility] for fever, tachycardia, leukocytosis, suspected due to chronic sacral ulcers. His hospital course is detailed below:  Sepsis Chronic sacral ulcers Presented meeting sepsis criteria and started on cefepime , linezolid , metronidazole  (10/3-discharge) given history of E faecalis resistant to both ampicillin  and vancomycin. Blood cultures were negative. Wound cultures showed rare E. Faecalis and candida. CT of the pelvis revealed a 3.7 cm collection at the posterior right hip, not definitively abscess formation.  Sacral wounds do not appear to be acutely purulent.  Concern for chronic osteomyelitis. Orthopedics consulted for opinion on wound culture and possible debridement.  Recommended no surgical intervention at this time, continue with wound care and off loading. ID consulted given chronic nature of wound infection, they recommended 6-8 weeks of IV antibiotics with cefepime ,  metronidazole  and daptomycin  per OPAT protocols. Fluconazole  was also added given wound culture grew Candida, but after this speciated as orthopsilosis patient was transitioned to micafungin  which he was discharged on. They will follow him outpatient. RD was also consulted to optimize wound healing and added Ensure and a multivitamin with minerals.   Right thumb injury Patient noted that he had incidentally injured his thumb while using pruning shears at home.  Uncertain of timing of last tetanus booster. Tdap administered during admission.   Incidental findings Recommend outpatient MRI for liver lesion. Consider further evaluation of splenomegaly.   Other chronic conditions were medically managed with home medications and formulary alternatives as necessary (suprapubic catheter, colostomy, hypertension, hyperlipidemia, PVCs)  PCP Follow-up Recommendations: Suprapubic cath needs to be changed on 11/1. Follow up in ID clinic Weekly CMP, ESR, CK, and CRP Interval 11 mm hypodensity in the posterior right hepatic lobe, indeterminate in density. Nonemergent MRI evaluation may be obtained if felt clinically appropriate.    Results/Tests Pending at Time of Discharge:  Unresulted Labs (From admission, onward)     Start     Ordered   11/02/23 1116  Yeast Susceptibilities  Add-on,   AD       Comments: Send for yeast susceptibilities for candida orthopsilosis from 10/7   Question Answer Comment  Patient immune status Normal   Release to patient Immediate      11/02/23 1115   10/30/23 0500  CK  Weekly,   R     Question:  Specimen collection method  Answer:  Lab=Lab collect   10/29/23 1044             Disposition: SNF  Discharge Condition: Medically stable for discharge   Discharge Exam:  Vitals:  11/02/23 0829 11/02/23 1111  BP: 103/62 96/61  Pulse: 80 83  Resp: (!) 21 (!) 24  Temp: 98.5 F (36.9 C) 98.3 F (36.8 C)  SpO2: 95% 94%   General: NAD Cardiovascular: RRR, no  m/r/g Respiratory: CTAB, normal work of breathing on room air  Abdomen: soft, non-distended, non-tender to palpation, ostomy and suprapubic catheter functioning  Extremities: thin, bandages in place over heels bilaterally   Significant Procedures: None this admission   Significant Labs and Imaging:  Recent Labs  Lab 11/02/23 0505  WBC 12.0*  HGB 10.8*  HCT 35.5*  PLT 435*   Recent Labs  Lab 11/02/23 0505  NA 136  K 4.4  CL 100  CO2 26  GLUCOSE 104*  BUN 33*  CREATININE 0.69  CALCIUM  9.4    Pertinent Imaging    Chest x-ray No acute cardiopulmonary abnormality.   CTAP 1. No CT evidence for acute intra-abdominal or pelvic abnormality. 2. Redemonstrated extensive decubitus ulcers with chronic fragmentation and osseous destructive change involving the bilateral hip joints. Some progressive resorption of right proximal femoral bone fragments compared to the prior pelvic CT but in general, overall less soft tissue thickening and gas within the joint spaces compared to the prior CT. Small 3.7 cm rim enhancing collection at the posterior right hip may reflect small inflammatory collection or abscess. Previously described sclerosis and osseous erosive change in deformity of the pubic bones, ischium, and sacrococcygeal region are otherwise grossly unchanged. 3. Interval 11 mm hypodensity in the posterior right hepatic lobe, indeterminate in density. Nonemergent MRI evaluation may be obtained if felt clinically appropriate. 4. Splenomegaly. 5. Left abdominal colostomy with small parastomal hernia containing fat and small bowel but no obstruction. 6. Suprapubic catheter in the bladder. At least 3 large bladder stones measuring up to 4.3 cm. Aortic Atherosclerosis (ICD10-I70.0).  CT L-Spine  1. No CT evidence for acute osseous abnormality. 2. Congenitally short appearing pedicles with multilevel moderate to severe canal stenosis. 3. Posterior arch defects at L4 and L5  with chronic soft tissue density containing calcifications in the midline posterior soft tissues extending from the skin surface to the dorsal canal. This is a chronic finding.   Discharge Medications:  Allergies as of 11/02/2023       Reactions   Firvanq [vancomycin] Itching   Redman's Syndrome        Medication List     TAKE these medications    acetaminophen  500 MG tablet Commonly known as: TYLENOL  Take 1,000 mg by mouth every 6 (six) hours as needed for moderate pain, headache or fever.   Advil  200 MG tablet Generic drug: ibuprofen  Take 400 mg by mouth every 6 (six) hours as needed for headache or moderate pain (pain score 4-6).   ascorbic acid  500 MG tablet Commonly known as: VITAMIN C  Take 1 tablet (500 mg total) by mouth 2 (two) times daily. What changed: when to take this   ceFEPime  IVPB Commonly known as: MAXIPIME  Inject 2 g into the vein every 8 (eight) hours. Indication:  Sacral osteomyelitis First Dose: Yes Last Day of Therapy:  12/04/2023 Labs - Once weekly:  CBC/D and BMP, Labs - Once weekly: ESR and CRP Method of administration: IV Push or per SNF protocol   collagenase  250 UNIT/GM ointment Commonly known as: SANTYL  Apply topically daily.   daptomycin  IVPB Commonly known as: CUBICIN  Inject 800 mg into the vein daily. Indication:  Sacral osteomyelitis First Dose: Yes Last Day of Therapy:  12/04/2023 Labs - Once  weekly:  CBC/D, BMP, and CPK Labs - Once weekly: ESR and CRP Method of administration: IV Push or per SNF protocol   diphenhydrAMINE -zinc  acetate cream Commonly known as: BENADRYL  Apply topically daily as needed for itching.   feeding supplement Liqd Take 237 mLs by mouth 2 (two) times daily between meals.   nutrition supplement (JUVEN) Pack Take 1 packet by mouth 2 (two) times daily at 8 am and 10 pm.   flecainide  100 MG tablet Commonly known as: TAMBOCOR  Take 1 tablet (100 mg total) by mouth 2 (two) times daily.    hydrocortisone 1 % ointment Apply topically 2 (two) times daily.   hydrOXYzine  25 MG tablet Commonly known as: ATARAX  Take 1 tablet (25 mg total) by mouth 3 (three) times daily as needed for itching.   Iron  325 (65 Fe) MG Tabs Take 1 tablet by mouth daily.   lisinopril -hydrochlorothiazide  20-25 MG tablet Commonly known as: ZESTORETIC  Take 1 tablet by mouth daily.   liver oil-zinc  oxide 40 % ointment Commonly known as: DESITIN Apply topically 2 (two) times daily.   metoprolol  succinate 50 MG 24 hr tablet Commonly known as: TOPROL -XL Take with or immediately following a meal.   metroNIDAZOLE  500 MG tablet Commonly known as: FLAGYL  Take 1 tablet (500 mg total) by mouth 2 (two) times daily.   micafungin  in sodium chloride  0.9 % 100 mL Please give 100 mg IV q24h Start taking on: November 03, 2023   multivitamin with minerals Tabs tablet Take 1 tablet by mouth daily.   nitroGLYCERIN  0.4 MG SL tablet Commonly known as: NITROSTAT  Place 1 tablet (0.4 mg total) under the tongue every 5 (five) minutes as needed for chest pain.   Potassium 99 MG Tabs Take 1 tablet by mouth 2 (two) times daily.   rosuvastatin  40 MG tablet Commonly known as: CRESTOR  TAKE 1 TABLET BY MOUTH ONCE DAILY . APPOINTMENT REQUIRED FOR FUTURE REFILLS   Sleep Aid (Doxylamine) 25 MG tablet Generic drug: doxylamine (Sleep) Take 25 mg by mouth at bedtime as needed for sleep.               Home Infusion Instuctions  (From admission, onward)           Start     Ordered   11/02/23 0000  Home infusion instructions       Question:  Instructions  Answer:  Flushing of vascular access device: 0.9% NaCl pre/post medication administration and prn patency; Heparin  100 u/ml, 5ml for implanted ports and Heparin  10u/ml, 5ml for all other central venous catheters.   11/02/23 1300              Discharge Care Instructions  (From admission, onward)           Start     Ordered   11/02/23 0000   Discharge wound care:       Comments: Wound care  Daily      Comments: Cleanse sacral wound with Vashe, do not rinse.  Apply 1/4 thick layer of Santyl  to wound bed, using a Q tip applicator insert saline moistened Kerlix gauze into any depth, cover with dry gauze and silicone foam or ABD pad whichever is preferred.  10/30/23 1126    10/30/23 1800    Wound care  2 times daily      Comments: Cleanse L ischial wound with Vashe wound cleanser Soila [phone]) do not rinse and allow to air dry.  Using a Q tip applicator apply Vashe moistened Kerlix gauze  to wound bed 2 times daily and prn soiling, pack into any tunneling and undermining areas. Cover with silicone or ABD pad whichever is preferred.   11/02/23 1300            Discharge Instructions: Please refer to Patient Instructions section of EMR for full details.  Patient was counseled important signs and symptoms that should prompt return to medical care, changes in medications, dietary instructions, activity restrictions, and follow up appointments.   Follow-Up Appointments:  Contact information for follow-up providers     Harden Jerona GAILS, MD Follow up in 2 week(s).   Specialty: Orthopedic Surgery Contact information: [address] [place] [address] [phone]         Jerrie Gathers, DO. Schedule an appointment as soon as possible for a visit.   Specialty: Family Medicine Why: ASAP for hospital follow up Contact information: [address] [place] [address] [phone]              Contact information for after-discharge care     Destination     [employer] .   Service: Skilled Nursing Contact information: [address] [place]  [address] [phone]                     [name], DO 11/02/2023, 1:17 PM PGY-1, [facility]   Upper Level Attestation I have seen and examined the patient with the resident. I agree with the history,  physical, and assessment.  Lucie Pinal, DO PGY-2, Family Medicine

## 2023-11-03 ENCOUNTER — Ambulatory Visit: Payer: Self-pay | Admitting: Family Medicine

## 2023-11-03 DIAGNOSIS — L309 Dermatitis, unspecified: Secondary | ICD-10-CM | POA: Diagnosis not present

## 2023-11-03 DIAGNOSIS — L89894 Pressure ulcer of other site, stage 4: Secondary | ICD-10-CM | POA: Diagnosis not present

## 2023-11-03 DIAGNOSIS — Q059 Spina bifida, unspecified: Secondary | ICD-10-CM | POA: Diagnosis not present

## 2023-11-03 DIAGNOSIS — K769 Liver disease, unspecified: Secondary | ICD-10-CM | POA: Diagnosis not present

## 2023-11-03 DIAGNOSIS — E785 Hyperlipidemia, unspecified: Secondary | ICD-10-CM | POA: Diagnosis not present

## 2023-11-03 DIAGNOSIS — Z9359 Other cystostomy status: Secondary | ICD-10-CM | POA: Diagnosis not present

## 2023-11-03 DIAGNOSIS — L98429 Non-pressure chronic ulcer of back with unspecified severity: Secondary | ICD-10-CM | POA: Diagnosis not present

## 2023-11-03 DIAGNOSIS — G8220 Paraplegia, unspecified: Secondary | ICD-10-CM | POA: Diagnosis not present

## 2023-11-03 DIAGNOSIS — L98418 Non-pressure chronic ulcer of buttock with other specified severity: Secondary | ICD-10-CM | POA: Diagnosis not present

## 2023-11-03 DIAGNOSIS — A419 Sepsis, unspecified organism: Secondary | ICD-10-CM | POA: Diagnosis not present

## 2023-11-03 DIAGNOSIS — I10 Essential (primary) hypertension: Secondary | ICD-10-CM | POA: Diagnosis not present

## 2023-11-03 DIAGNOSIS — L89154 Pressure ulcer of sacral region, stage 4: Secondary | ICD-10-CM | POA: Diagnosis not present

## 2023-11-03 DIAGNOSIS — L89324 Pressure ulcer of left buttock, stage 4: Secondary | ICD-10-CM | POA: Diagnosis not present

## 2023-11-04 DIAGNOSIS — M8660 Other chronic osteomyelitis, unspecified site: Secondary | ICD-10-CM | POA: Diagnosis not present

## 2023-11-04 DIAGNOSIS — Z7409 Other reduced mobility: Secondary | ICD-10-CM | POA: Diagnosis not present

## 2023-11-04 DIAGNOSIS — L98429 Non-pressure chronic ulcer of back with unspecified severity: Secondary | ICD-10-CM | POA: Diagnosis not present

## 2023-11-04 DIAGNOSIS — R531 Weakness: Secondary | ICD-10-CM | POA: Diagnosis not present

## 2023-11-04 NOTE — Plan of Care (Signed)
 FMTS Interim Progress Note  Called [facility] to ensure antibiotic order was accurate.  Spoke with patient's nurse and verified patient's prescription orders to include daptomycin , cefepime  and metronidazole  to end on 12/04/2023.  Additionally verified patient's micafungin  order is set to end on 11/15/2023.  Nurse verified the orders and will confirm them in the system.  Theophilus Pagan, MD 11/04/2023, 11:59 AM PGY-3, [facility] pager [phone]

## 2023-11-05 DIAGNOSIS — L98429 Non-pressure chronic ulcer of back with unspecified severity: Secondary | ICD-10-CM | POA: Diagnosis not present

## 2023-11-05 DIAGNOSIS — Z792 Long term (current) use of antibiotics: Secondary | ICD-10-CM | POA: Diagnosis not present

## 2023-11-05 DIAGNOSIS — R531 Weakness: Secondary | ICD-10-CM | POA: Diagnosis not present

## 2023-11-05 DIAGNOSIS — Z7409 Other reduced mobility: Secondary | ICD-10-CM | POA: Diagnosis not present

## 2023-11-05 DIAGNOSIS — M8660 Other chronic osteomyelitis, unspecified site: Secondary | ICD-10-CM | POA: Diagnosis not present

## 2023-11-06 DIAGNOSIS — R531 Weakness: Secondary | ICD-10-CM | POA: Diagnosis not present

## 2023-11-06 DIAGNOSIS — I10 Essential (primary) hypertension: Secondary | ICD-10-CM | POA: Diagnosis not present

## 2023-11-06 DIAGNOSIS — Z7409 Other reduced mobility: Secondary | ICD-10-CM | POA: Diagnosis not present

## 2023-11-06 DIAGNOSIS — M8660 Other chronic osteomyelitis, unspecified site: Secondary | ICD-10-CM | POA: Diagnosis not present

## 2023-11-06 DIAGNOSIS — L98429 Non-pressure chronic ulcer of back with unspecified severity: Secondary | ICD-10-CM | POA: Diagnosis not present

## 2023-11-07 DIAGNOSIS — L98429 Non-pressure chronic ulcer of back with unspecified severity: Secondary | ICD-10-CM | POA: Diagnosis not present

## 2023-11-07 DIAGNOSIS — G8220 Paraplegia, unspecified: Secondary | ICD-10-CM | POA: Diagnosis not present

## 2023-11-07 DIAGNOSIS — Q059 Spina bifida, unspecified: Secondary | ICD-10-CM | POA: Diagnosis not present

## 2023-11-07 DIAGNOSIS — R531 Weakness: Secondary | ICD-10-CM | POA: Diagnosis not present

## 2023-11-07 DIAGNOSIS — M8660 Other chronic osteomyelitis, unspecified site: Secondary | ICD-10-CM | POA: Diagnosis not present

## 2023-11-07 DIAGNOSIS — Z792 Long term (current) use of antibiotics: Secondary | ICD-10-CM | POA: Diagnosis not present

## 2023-11-07 DIAGNOSIS — I10 Essential (primary) hypertension: Secondary | ICD-10-CM | POA: Diagnosis not present

## 2023-11-07 DIAGNOSIS — Z7409 Other reduced mobility: Secondary | ICD-10-CM | POA: Diagnosis not present

## 2023-11-08 LAB — AEROBIC/ANAEROBIC CULTURE W GRAM STAIN (SURGICAL/DEEP WOUND): Gram Stain: NONE SEEN

## 2023-11-08 LAB — YEAST SUSCEPTIBILITIES
Amphotericin B MIC: 0.5
Anidulafungin MIC: 1
Caspofungin MIC: 0.5
Fluconazole Islt MIC: 8
ISAVUCONAZOLE MIC: 0.12
Itraconazole MIC: 0.5
Micafungin MIC: 0.5
Posaconazole MIC: 1
REZAFUNGIN MIC: 0.5
Voriconazole MIC: 0.25

## 2023-11-10 ENCOUNTER — Telehealth: Payer: Self-pay | Admitting: Orthopedic Surgery

## 2023-11-10 DIAGNOSIS — R531 Weakness: Secondary | ICD-10-CM | POA: Diagnosis not present

## 2023-11-10 DIAGNOSIS — L89894 Pressure ulcer of other site, stage 4: Secondary | ICD-10-CM | POA: Diagnosis not present

## 2023-11-10 DIAGNOSIS — M8660 Other chronic osteomyelitis, unspecified site: Secondary | ICD-10-CM | POA: Diagnosis not present

## 2023-11-10 DIAGNOSIS — N319 Neuromuscular dysfunction of bladder, unspecified: Secondary | ICD-10-CM | POA: Diagnosis not present

## 2023-11-10 DIAGNOSIS — L89154 Pressure ulcer of sacral region, stage 4: Secondary | ICD-10-CM | POA: Diagnosis not present

## 2023-11-10 DIAGNOSIS — Z7409 Other reduced mobility: Secondary | ICD-10-CM | POA: Diagnosis not present

## 2023-11-10 DIAGNOSIS — L089 Local infection of the skin and subcutaneous tissue, unspecified: Secondary | ICD-10-CM | POA: Diagnosis not present

## 2023-11-10 DIAGNOSIS — Z792 Long term (current) use of antibiotics: Secondary | ICD-10-CM | POA: Diagnosis not present

## 2023-11-10 DIAGNOSIS — L98429 Non-pressure chronic ulcer of back with unspecified severity: Secondary | ICD-10-CM | POA: Diagnosis not present

## 2023-11-10 DIAGNOSIS — G8220 Paraplegia, unspecified: Secondary | ICD-10-CM | POA: Diagnosis not present

## 2023-11-10 DIAGNOSIS — L89324 Pressure ulcer of left buttock, stage 4: Secondary | ICD-10-CM | POA: Diagnosis not present

## 2023-11-10 DIAGNOSIS — L98418 Non-pressure chronic ulcer of buttock with other specified severity: Secondary | ICD-10-CM | POA: Diagnosis not present

## 2023-11-10 DIAGNOSIS — Z9359 Other cystostomy status: Secondary | ICD-10-CM | POA: Diagnosis not present

## 2023-11-11 DIAGNOSIS — N319 Neuromuscular dysfunction of bladder, unspecified: Secondary | ICD-10-CM | POA: Diagnosis not present

## 2023-11-11 DIAGNOSIS — L98429 Non-pressure chronic ulcer of back with unspecified severity: Secondary | ICD-10-CM | POA: Diagnosis not present

## 2023-11-11 DIAGNOSIS — Q059 Spina bifida, unspecified: Secondary | ICD-10-CM | POA: Diagnosis not present

## 2023-11-11 DIAGNOSIS — M8660 Other chronic osteomyelitis, unspecified site: Secondary | ICD-10-CM | POA: Diagnosis not present

## 2023-11-11 DIAGNOSIS — L089 Local infection of the skin and subcutaneous tissue, unspecified: Secondary | ICD-10-CM | POA: Diagnosis not present

## 2023-11-12 DIAGNOSIS — Q059 Spina bifida, unspecified: Secondary | ICD-10-CM | POA: Diagnosis not present

## 2023-11-12 DIAGNOSIS — L089 Local infection of the skin and subcutaneous tissue, unspecified: Secondary | ICD-10-CM | POA: Diagnosis not present

## 2023-11-12 DIAGNOSIS — M8660 Other chronic osteomyelitis, unspecified site: Secondary | ICD-10-CM | POA: Diagnosis not present

## 2023-11-12 DIAGNOSIS — L98429 Non-pressure chronic ulcer of back with unspecified severity: Secondary | ICD-10-CM | POA: Diagnosis not present

## 2023-11-14 DIAGNOSIS — L089 Local infection of the skin and subcutaneous tissue, unspecified: Secondary | ICD-10-CM | POA: Diagnosis not present

## 2023-11-14 DIAGNOSIS — L98429 Non-pressure chronic ulcer of back with unspecified severity: Secondary | ICD-10-CM | POA: Diagnosis not present

## 2023-11-14 DIAGNOSIS — Q059 Spina bifida, unspecified: Secondary | ICD-10-CM | POA: Diagnosis not present

## 2023-11-14 DIAGNOSIS — Z792 Long term (current) use of antibiotics: Secondary | ICD-10-CM | POA: Diagnosis not present

## 2023-11-14 DIAGNOSIS — L89310 Pressure ulcer of right buttock, unstageable: Secondary | ICD-10-CM | POA: Diagnosis not present

## 2023-11-14 DIAGNOSIS — M8660 Other chronic osteomyelitis, unspecified site: Secondary | ICD-10-CM | POA: Diagnosis not present

## 2023-11-14 DIAGNOSIS — L89224 Pressure ulcer of left hip, stage 4: Secondary | ICD-10-CM | POA: Diagnosis not present

## 2023-11-15 DIAGNOSIS — Q059 Spina bifida, unspecified: Secondary | ICD-10-CM | POA: Diagnosis not present

## 2023-11-15 DIAGNOSIS — G4700 Insomnia, unspecified: Secondary | ICD-10-CM | POA: Diagnosis not present

## 2023-11-15 DIAGNOSIS — L89310 Pressure ulcer of right buttock, unstageable: Secondary | ICD-10-CM | POA: Diagnosis not present

## 2023-11-15 DIAGNOSIS — L89224 Pressure ulcer of left hip, stage 4: Secondary | ICD-10-CM | POA: Diagnosis not present

## 2023-11-15 DIAGNOSIS — L98429 Non-pressure chronic ulcer of back with unspecified severity: Secondary | ICD-10-CM | POA: Diagnosis not present

## 2023-11-15 DIAGNOSIS — M8660 Other chronic osteomyelitis, unspecified site: Secondary | ICD-10-CM | POA: Diagnosis not present

## 2023-11-15 DIAGNOSIS — Z792 Long term (current) use of antibiotics: Secondary | ICD-10-CM | POA: Diagnosis not present

## 2023-11-15 DIAGNOSIS — L089 Local infection of the skin and subcutaneous tissue, unspecified: Secondary | ICD-10-CM | POA: Diagnosis not present

## 2023-11-17 DIAGNOSIS — L89154 Pressure ulcer of sacral region, stage 4: Secondary | ICD-10-CM | POA: Diagnosis not present

## 2023-11-17 DIAGNOSIS — L089 Local infection of the skin and subcutaneous tissue, unspecified: Secondary | ICD-10-CM | POA: Diagnosis not present

## 2023-11-17 DIAGNOSIS — L89324 Pressure ulcer of left buttock, stage 4: Secondary | ICD-10-CM | POA: Diagnosis not present

## 2023-11-17 DIAGNOSIS — M8660 Other chronic osteomyelitis, unspecified site: Secondary | ICD-10-CM | POA: Diagnosis not present

## 2023-11-17 DIAGNOSIS — L98418 Non-pressure chronic ulcer of buttock with other specified severity: Secondary | ICD-10-CM | POA: Diagnosis not present

## 2023-11-17 DIAGNOSIS — L89224 Pressure ulcer of left hip, stage 4: Secondary | ICD-10-CM | POA: Diagnosis not present

## 2023-11-17 DIAGNOSIS — L89894 Pressure ulcer of other site, stage 4: Secondary | ICD-10-CM | POA: Diagnosis not present

## 2023-11-18 DIAGNOSIS — M8660 Other chronic osteomyelitis, unspecified site: Secondary | ICD-10-CM | POA: Diagnosis not present

## 2023-11-18 DIAGNOSIS — J329 Chronic sinusitis, unspecified: Secondary | ICD-10-CM | POA: Diagnosis not present

## 2023-11-18 DIAGNOSIS — Z792 Long term (current) use of antibiotics: Secondary | ICD-10-CM | POA: Diagnosis not present

## 2023-11-19 DIAGNOSIS — Z792 Long term (current) use of antibiotics: Secondary | ICD-10-CM | POA: Diagnosis not present

## 2023-11-19 DIAGNOSIS — R5381 Other malaise: Secondary | ICD-10-CM | POA: Diagnosis not present

## 2023-11-19 DIAGNOSIS — M8660 Other chronic osteomyelitis, unspecified site: Secondary | ICD-10-CM | POA: Diagnosis not present

## 2023-11-19 DIAGNOSIS — J329 Chronic sinusitis, unspecified: Secondary | ICD-10-CM | POA: Diagnosis not present

## 2023-11-23 ENCOUNTER — Inpatient Hospital Stay: Admitting: Family

## 2023-11-23 ENCOUNTER — Encounter: Payer: Self-pay | Admitting: Radiology

## 2023-11-23 ENCOUNTER — Ambulatory Visit: Admitting: Orthopedic Surgery

## 2023-12-01 ENCOUNTER — Inpatient Hospital Stay: Admitting: Infectious Diseases

## 2023-12-09 ENCOUNTER — Ambulatory Visit: Admitting: Physical Therapy

## 2023-12-11 ENCOUNTER — Telehealth: Payer: Self-pay | Admitting: Pharmacist

## 2023-12-11 NOTE — Telephone Encounter (Signed)
 Patient contacted for follow-up of adherence QI for rosuvastatin  40 mg, hydrochlorothiazide /lisinopril  25-20mg   Patient reports he is currently at nursing home at [facility]   Total time with patient call and documentation of interaction: 5 minutes.

## 2023-12-25 ENCOUNTER — Other Ambulatory Visit: Payer: Self-pay

## 2023-12-25 ENCOUNTER — Ambulatory Visit

## 2023-12-25 DIAGNOSIS — M6281 Muscle weakness (generalized): Secondary | ICD-10-CM | POA: Insufficient documentation

## 2023-12-25 DIAGNOSIS — G8220 Paraplegia, unspecified: Secondary | ICD-10-CM | POA: Diagnosis not present

## 2023-12-25 DIAGNOSIS — R2689 Other abnormalities of gait and mobility: Secondary | ICD-10-CM | POA: Diagnosis present

## 2023-12-25 NOTE — Therapy (Signed)
 OUTPATIENT PHYSICAL THERAPY WHEELCHAIR EVALUATION   Patient Name: Anthony [name] MRN: [id number] DOB:08-02-1970, 53 y.o., male Today's Date: 12/25/2023  END OF SESSION:  PT End of Session - 12/25/23 1041     Visit Number 1    Number of Visits 1    PT Start Time 1030    PT Stop Time 1115    PT Time Calculation (min) 45 min    Equipment Utilized During Treatment Gait belt    Activity Tolerance Patient tolerated treatment well    Behavior During Therapy WFL for tasks assessed/performed          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 01/24/2017   Osteomyelitis (HCC)    left hip   Paraplegia (HCC)    secondary to Spina Bifida   Polymicrobial bacteremia with sepsis:    Pressure injury of skin of left buttock 05/13/2021   PVCs (premature ventricular contractions)    S/P debridement 10/30/2021   Septic shock (HCC)    due to osteomyelitis   Slow transit constipation 03/06/2017   Spina bifida    Status post debridement 01/24/2017   Wheelchair bound    Past Surgical History:  Procedure Laterality Date   APPLICATION OF WOUND VAC  09/11/2021   Procedure: APPLICATION OF WOUND VAC;  Surgeon: Harden Jerona GAILS, MD;  Location: [facility] OR;  Service: Orthopedics;;   BACK SURGERY     BUBBLE STUDY  09/09/2021   Procedure: BUBBLE STUDY;  Surgeon: Shlomo Wilbert SAUNDERS, MD;  Location: [facility] ENDOSCOPY;  Service: Cardiovascular;;   HIP SURGERY     I & D EXTREMITY Left 09/11/2021   Procedure: LEFT HIP DEBRIDEMENT AND REMOVAL FEMORAL HEAD;  Surgeon: Harden Jerona GAILS, MD;  Location: [facility] OR;  Service: Orthopedics;  Laterality: Left;   I & D EXTREMITY Left 09/13/2021   Procedure: DEBRIDEMENT  LEFT HIP;  Surgeon: Harden Jerona GAILS, MD;  Location: [facility] OR;  Service: Orthopedics;  Laterality: Left;   I & D EXTREMITY Left 09/18/2021   Procedure: DEBRIDEMENT LEFT HIP, WOUND VAC EXCHANGE;  Surgeon: Harden Jerona GAILS, MD;  Location: [facility] OR;  Service: Orthopedics;  Laterality: Left;   I & D EXTREMITY Left 10/30/2021   Procedure: LEFT HIP DEBRIDEMENT WITH KERECIS PLACEMENT;  Surgeon: Harden Jerona GAILS, MD;  Location: [facility] OR;  Service: Orthopedics;  Laterality: Left;   I & D EXTREMITY Bilateral 08/20/2022   Procedure: BILATERAL HIP DEBRIDEMENT;  Surgeon: Harden Jerona GAILS, MD;  Location: [facility] OR;  Service: Orthopedics;  Laterality: Bilateral;   INCISION AND DRAINAGE OF WOUND N/A 01/24/2017   Procedure: IRRIGATION AND DEBRIDEMENT WOUND- BUTTOCK ABSCESS;  Surgeon: Aron Shoulders, MD;  Location: [facility] ORS;  Service: General;  Laterality: N/A;   LAPAROSCOPIC LOOP COLOSTOMY N/A 08/12/2022   Procedure: LAPAROSCOPIC LOOP SIGMOID COLOSTOMY;  Surgeon: Ebbie Cough, MD;  Location: [facility] OR;  Service: General;  Laterality: N/A;   [name] WITHOUT CARDIOVERSION N/A 09/09/2021   Procedure: TRANSESOPHAGEAL ECHOCARDIOGRAM ([name]);  Surgeon: Shlomo Wilbert SAUNDERS, MD;  Location: [facility] ENDOSCOPY;  Service: Cardiovascular;  Laterality: N/A;   [name] WITHOUT CARDIOVERSION N/A 08/04/2022   Procedure: TRANSESOPHAGEAL ECHOCARDIOGRAM;  Surgeon: Mona Vinie BROCKS, MD;  Location: [facility] INVASIVE CV LAB;  Service: Cardiovascular;  Laterality: N/A;   Patient Active Problem List   Diagnosis Date Noted   Dermatitis 10/31/2023   Acute on chronic osteomyelitis (HCC) 10/29/2023   Sacral wound 10/27/2023   Osteomyelitis, pelvic region and thigh (HCC) 10/27/2023   Chronic health problem 10/24/2023   Unstageable pressure ulcer of sacral region (HCC) 10/24/2023   Injury of right thumb 10/24/2023   History of suprapubic catheter 10/24/2023   Thrombophilia 09/08/2022   Sepsis without acute organ dysfunction (HCC) 09/07/2022   Advanced Directives in ACP section 09/07/2022    Polymicrobial bacterial infection 08/28/2022   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 08/16/2022   Pressure injury, stage 4, with infection (HCC) 08/05/2022   Acute deep vein thrombosis (DVT) of axillary vein of right upper extremity (HCC) 08/02/2022   Anemia of infection and chronic disease 07/26/2022   Chronic osteomyelitis of pelvic region (HCC) 07/26/2022   Febrile illness 02/27/2022   Pyogenic arthritis of left hip (HCC)    Septic arthritis of bilateral hips (HCC) 09/03/2021   Neurogenic bladder 03/06/2017   Suprapubic catheter (HCC) 03/06/2017   Paraplegia (HCC) 01/29/2017   Decubitus ulcer due to spina bifida (HCC) 01/27/2017   Hyperlipidemia 03/11/2014   Spina bifida (HCC) 09/30/2012   Acute Right shoulder pain 01/27/2011   Allergic rhinitis 06/01/2007   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 Darren Jernigan  REFERRING PROVIDER: Donah Laymon PARAS, MD  THERAPY DIAG:  Other abnormalities of gait and mobility  Muscle weakness (generalized)  Rationale for Evaluation and Treatment Rehabilitation  SUBJECTIVE:                                                                                                                                                                                           SUBJECTIVE STATEMENT: Pt presents for wheelchair evaluation. Patient has PMH that consists of spina bifida, recurrent decubitus ulcer, septic arthritis of bilateral hips, chronic osteomyelitis, DVT, UTI. Patient was recently admitted in the hospital from 10/23/23-11/02/23 for unstageable pressure ulcer of sacral region with sepsis and injury of right thumb. Patient is currently living in a SNF [facility] care) and he expects to be there for next couple of years due to his medical condition. Patient reports he is currently using a used chair that his friend bought for him from [employer]. But the chair is too large for him and he is only using low profile  cushion which is not meeting his medically necessisity. Prior to that, he was using his old chair that was >39 years old and  was broken.   PRECAUTIONS: Fall  RED FLAGS: None  WEIGHT BEARING RESTRICTIONS No    OCCUPATION: on disability  PLOF:  Needs assistance with ADLs, Needs assistance with homemaking, Needs assistance with gait, and Needs assistance with transfers  PATIENT GOALS: Get a manual chair that fits him well with cushion         MEDICAL HISTORY:  Primary diagnosis onset: 09/01/2023- date of referral     Medical Diagnosis with ICD-10 code: G82.20 (ICD-10-CM) - Paraplegia (HCC)     [] Progressive disease  Relevant future surgeries:     Height: 5' 4 Weight: 170 lbs Explain recent changes or trends in weight:      History:  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 01/24/2017   Osteomyelitis (HCC)    left hip   Paraplegia (HCC)    secondary to Spina Bifida   Polymicrobial bacteremia with sepsis:    Pressure injury of skin of left buttock 05/13/2021   PVCs (premature ventricular contractions)    S/P debridement 10/30/2021   Septic shock (HCC)    due to osteomyelitis   Slow transit constipation 03/06/2017   Spina bifida    Status post debridement 01/24/2017   Wheelchair bound        Cardio Status:  Functional Limitations:   [] Intact  [x]  Impaired    HTN  Respiratory Status:  Functional Limitations:   [x] Intact  [] Impaired   [] SOB [] COPD [] O2 Dependent ______LPM  [] Ventilator Dependent  Resp equip:                                                     Objective Measure(s):   Orthotics:   [] Amputee:                                                              [] Prosthesis:        HOME ENVIRONMENT:  [] House [] Condo/town home [] Apartment [] Asst living [x] LTCF         [] Own  [] Rent   [] Lives alone [] Lives with others -                             Hours without assistance:   [x] Home is accessible to patient                                 Storage of wheelchair:  [x] In home   [] Other Comments:        COMMUNITY :  TRANSPORTATION:  [] Car [] Stage Manager [] Adapted w/c Lift []  Ambulance [] Other:                     [] Sits  in wheelchair during transport   Where is w/c stored during transport?  [x] [employer]  []  [employer]  r   [] Self-Driver       Drive while in  Biomedical Scientist [] yes [x] no   Employment and/or school:  Specific requirements pertaining to mobility        Other:  COMMUNICATION:  Verbal Communication  [x] WFL [] receptive [] WFL [] expressive [] Understandable  [] Difficult to understand  [] non-communicative  Primary Language:______English________ 2nd:_____________  Communication provided by:[x] Patient [] Family [] Caregiver [] Translator   [] Uses an augmentative communication device     Manufacturer/Model :                                                                MOBILITY/BALANCE:  Sitting Balance  Standing Balance  Transfers  Ambulation   [] WFL      [] WFL  [] Independent  []  Independent   [x] Uses UE for balance in sitting Comments:  [] Uses UE/device for stability Comments:  [x]  Min assist  []  Ambulates independently with       device:___________________      []  Mod assist  []  Able to ambulate ______ feet        safely/functionally/independently   []  Min assist  []  Min assist  []  Max assist  []  Non-functional ambulator         History/High risk of falls   []  Mod assist  []  Mod assist  []  Dependent  [x]  Unable to ambulate   []  Max  assist  []  Max assist  Transfer method:[] 1 person [] 2 person [x] sliding board [] squat pivot [] stand pivot [] mechanical patient lift  [] other:   []  Unable  [x]  Unable    Fall  History: # of falls in the past 6 months? 0 # of "near" falls in the past 6 months? 0    CURRENT SEATING / MOBILITY:  Current Mobility Device: [] None [] Cane/Walker [x] Manual [] Dependent [] Dependent w/ Tilt rScooter  [] Power (type of control): This chair was bought used for patient by a friend since his old chair of >15 year broke, about a year ago  Manufacturer:  Model:  Serial #:   Size:  Color:  Age:   Purchased by whom: self purchased  Current condition of mobility base:  ill fitting  Current seating system:                             1+                                          Age of seating system:    Describe posture in present seating system: Chair is too big for the patient, cushion is low profile and not meeting medical necessasity as patient has active wound.   Is the current mobility meeting medical necessity?:  [] Yes [x] No Describe: see above                                    Ability to complete Mobility-Related Activities of Daily Living (MRADL's) with Current Mobility Device:   Move room to room  [x] Independent  []   Min [] Mod [] Max assist  [] Unable  Comments:   Meal prep  [] Independent  [] Min [] Mod [] Max assist  [x] Unable    Feeding  [x] Independent  [] Min [] Mod [] Max assist  [] Unable    Bathing  [] Independent  [] Min [] Mod [x] Max assist  [] Unable    Grooming  [x] Independent  [] Min [] Mod [] Max assist  [] Unable    UE dressing  [x] Independent  [] Min [] Mod [] Max assist  [] Unable    LE dressing  [x] Independent   [] Min [] Mod [] Max assist  [] Unable    Toileting  [] Independent  [] Min [] Mod [] Max assist  [x] Unable    Bowel Mgt: []  Continent []  Incontinent []  Accidents []  Diapers [x]  Colostomy []  Bowel Program:  Bladder Mgt: []  Continent []  Incontinent []  Accidents []  Diapers []  Urinal []  Intermittent Cath []  Indwelling Cath [x]  Supra-pubic Cath     Current Mobility Equipment Trialed/ Ruled Out:    Does not meet mobility needs due to:    Mark all boxes that indicate inability to  use the specific equipment listed     Meets needs for safe  independent functional  ambulation  / mobility    Risk of  Falling or History of Falls    Enviromental limitations      Cognition    Safety concerns with  physical ability    Decreased / limitations endurance  & strength     Decreased / limitations  motor skills  & coordination    Pain    Pace /  Speed    Cardiac and/or  respiratory condition    Contra - indicated by diagnosis   Cane/Crutches  []   []   []   []   []   []   []   []   []   []   [x]    Walker / Rollator  []  NA   []   []   []   []   []   []   []   []   []   []   [x]     Manual Wheelchair [id number]:  []  NA  [x]   []   []   []   []   []   []   []   []   []   []    Manual W/C (K0005) with power assist  []  NA  []   []   []   []   []   []   []   []   []   []   []    Scooter  []  NA  []   []   []   []   []   []   []   []   []   []   []    Power Wheelchair: standard joystick  []  NA  []   []   []   []   []   []   []   []   []   []   []    Power Wheelchair: alternative controls  []  NA  []   []   []   []   []   []   []   []   []   []   []    Summary:  The least costly alternative for independent functional mobility was found to be:    []  Crutch/Cane  []  Walker [x]  Manual w/c  []  Manual w/c with power assist   []  Scooter   []  Power w/c std joystick   []  Power w/c alternative control        []  Requires dependent care mobility device   Cabin Crew for [employer] skills are adequate for safe mobility equipment operation  [x]   Yes []   No  Patient is willing and motivated to use recommended mobility equipment  [x]   Yes []   No       []   Patient is unable to safely operate mobility equipment independently and requires dependent care equipment Comments:           SENSATION and SKIN ISSUES:  Sensation []  Intact  [x]  Impaired []  Absent []  Hyposensate []  Hypersensate  []  Defensiveness  Location(s) of impairment: sacrum   Pressure Relief Method(s):  [x]  Lean side to side to offload (without risk of falling)   []   W/C push up (4+ times/hour for 15+ seconds) []  Stand up (without risk of falling)    []  Other: (Describe): Effective pressure relief method(s) above can be performed consistently throughout the day: [] Yes  [x]  No If not, Why?: Bil shoulder pain, Pt has torn rotator cuff in R shoulder from a fall in 2024. Pt did not have surgery.   Skin Integrity Risk:       []  Low risk           []  Moderate risk            [x]  High risk  If high risk, explain:   Skin Issues/Skin Integrity  Current skin Issues  [x]  Yes []  No []  Intact  []   Red area   [x]   Open area  []  Scar tissue  [x]  At risk from prolonged sitting  Where:sacrum, buttocks History of Skin Issues  [x]  Yes []  No Where : sacrum When: current Stage: unstageable Hx of skin flap surgeries  [x]  Yes []  No Where: buttocks When: When he was younger  Pain: [x]  Yes []  No   Pain Location(s): R>L shoulder; Lower back Intensity scale: (0-10) : 8/10  How does pain interfere with mobility and/or MRADLs? - difficulty with performing side to side lean, performing wheelchair push ups, difficulty with transfers.        MAT EVALUATION:  Neuro-Muscular Status: (Tone, Reflexive, Responses, etc.)     []   Intact   []  Spasticity:  []  Hypotonicity  []  Fluctuating  [x]  Muscle Spasms bil hips, thighs []  Poor Righting Reactions/Poor Equilibrium Reactions  []  Primal Reflex(s):    Comments:            COMMENTS:    POSTURE:     Comments:  Pelvis Anterior/Posterior:  [x]  Neutral   []  Posterior  []  Anterior  []  Fixed - No movement []  Tendency away from neutral []  Flexible []  Self-correction []  External correction Obliquity (viewed from front)  [x]  WFL []  R Obliquity []  L Obliquity  []  Fixed - No movement []  Tendency away from neutral []  Flexible []  Self-correction []  External correction Rotation  [x]  WFL []  R anterior []  L anterior  []  Fixed - No movement []  Tendency away from neutral []  Flexible []  Self-correction []   External correction Tonal Influence Pelvis:  [x]  Normal []  Flaccid []  Low tone []  Spasticity []  Dystonia []  Pelvis thrust []  Other:    Trunk Anterior/Posterior:  [x]  WFL []  Thoracic kyphosis []  Lumbar lordosis  []  Fixed - No movement []  Tendency away from neutral []  Flexible []  Self-correction []  External correction  [x]  WFL []  Convex to left  []  Convex to right []  S-curve   []  C-curve []  Multiple curves []  Tendency away from neutral []  Flexible []  Self-correction []  External correction Rotation of shoulders and upper trunk:  [x]  Neutral []  Left-anterior []  Right- anterior []  Fixed- no movement []  Tendency away from neutral []  Flexible []  Self correction []  External correction Tonal influence Trunk:  [x]  Normal []  Flaccid []  Low tone []  Spasticity []  Dystonia []  Other:   Head & Neck  [  x] Functional []  Flexed    []  Extended []  Rotated right  []  Rotated left []  Laterally flexed right []  Laterally flexed left []  Cervical hyperextension   [x]  Good head control []  Adequate head control []  Limited head control []  Absent head control Describe tone/movement of head and neck:      Lower Extremity Measurements: LE ROM:  Passive ROM Right 12/25/2023 Left 12/25/2023  Hip flexion Lacks 40 deg to full exension   Hip extension    Hip abduction    Hip adduction    Knee flexion    Knee extension    Ankle dorsiflexion    Ankle plantarflexion     (Blank rows = not tested)  LE MMT:  MMT Right 12/25/2023 Left 12/25/2023  Hip flexion    Hip extension    Hip abduction    Hip adduction    Knee flexion    Knee extension    Ankle dorsiflexion    Ankle plantarflexion     (Blank rows = not tested)  Hip positions:  [x]  Neutral   []  Abducted   []  Adducted  []  Subluxed   []  Dislocated   []  Fixed   []  Tendency away from neutral []  Flexible []  Self-correction []  External correction   Hip Windswept:[x]  Neutral  []  Right    []  Left  []  Subluxed   []   Dislocated   []  Fixed   []  Tendency away from neutral []  Flexible []  Self-correction []  External correction  LE Tone: []  Normal []  Low tone []  Spasticity [x]  Flaccid []  Dystonia []  Rocks/Extends at hip []  Thrust into knee extension []  Pushes legs downward into footrest  Foot positioning: ROM Concerns: Dorsiflexed: []  Right   []  Left Plantar flexed: []  Right    []  Left Inversion: []  Right    []  Left Eversion: []  Right    []  Left  LE Edema: [x]  1+ (Barely detectable impression when finger is pressed into skin) []  2+ (slight indentation. 15 seconds to rebound) []  3+ (deeper indentation. 30 seconds to rebound) []  4+ (>30 seconds to rebound)  UE Measurements:  UPPER EXTREMITY ROM:   Active ROM Right 12/25/2023 Left 12/25/2023  Shoulder flexion 140 pain 160  Shoulder abduction 120 pain 160  Shoulder adduction    Elbow flexion    Elbow extension    Wrist flexion    Wrist extension    (Blank rows = not tested)  UPPER EXTREMITY MMT:  MMT Right 12/25/2023 Left 12/25/2023  Shoulder flexion 3+ pain 5  Shoulder abduction 3+ pain 5  Shoulder adduction    Elbow flexion 5 5  Elbow extension 5 5  Wrist flexion    Wrist extension    Pinch strength    Grip strength    (Blank rows = not tested)  Shoulder Posture:  Right Tendency towards Left  [x]   Functional [x]    []   Elevation []    []   Depression []    []   Protraction []    []   Retraction []    []   Internal rotation []    []   External rotation []    []   Subluxed []     UE Tone: [x]  Normal []  Flaccid []  Low tone []  Spasticity  []  Dystonia []  Other:   UE Edema: [x]  1+ (Barely detectable impression when finger is pressed into skin) []  2+ (slight indentation. 15 seconds to rebound) []  3+ (deeper indentation. 30 seconds to rebound) []  4+ (>30 seconds to rebound)  Wrist/Hand: Handedness: [x]  Right   []  Left   []   NA: Comments:  Right  Left  [x]   WNL [x]    []   Limitations []    []   Contractures []    []   Fisting  []    []   Tremors []    []   Weak grasp []    []   Poor dexterity []    []   Hand movement non functional []    []   Paralysis []         MOBILITY BASE RECOMMENDATIONS and JUSTIFICATION:  MOBILITY BASE  JUSTIFICATION   Manufacturer:   [employer] 2 Model:      Sunrise                        Color:  Seat Width:  18 Seat Depth 16   [x]  Manual mobility base (continue below)   []  Scooter/POV  []  Power mobility base   Number of hours per day spent in above selected mobility base: 18+ hours  Typical daily mobility base use Schedule: during the awake hours   [x]  is not a safe, functional ambulator  [x]  limitation prevents from completing a MRADL(s) within a reasonable time frame    [x]  limitation places at high risk of morbidity or mortality secondary to  the attempts to perform a    MRADL(s)  [x]  limitation prevents accomplishing a MRADL(s) entirely  [x]  provide independent mobility  [x]  equipment is a lifetime medical need  [x]  walker or cane inadequate  [x]  any type manual wheelchair      inadequate  []  scooter/POV inadequate      []  requires dependent mobility          MANUAL MOBILITY      []  Standard manual wheelchair  K0001      Arm:    []  both []  right  []  left      Foot:   []  both []  right   []  left  []  self-propels wheelchair  []  will use on regular basis  []  chair fits throughout home  []  willing and motivated to use  []  propels with assistance     []  dependent use   []  Standard hemi-manual wheelchair  K0002      Arm:    []  both []  right  []  left      Foot:   []  both []  right   []  left  []  lower seat height required to foot propel  []  short stature  []  self-propels wheelchair  []  will use on regular basis  []  chair fits throughout home  []  willing and motivated to use   []  propels with assistance  []  dependent use   []  Lightweight manual wheelchair  K0003      Arm:    []  both []  right  []  left      Foot:   []  both  []  right  []  left                   []  hemi  height required  []  medical condition and weight of  wheelchair affect ability to self      propel standard manual wheelchair in the residence  []  can and does self-propel (marginal propulsion skills)  []  daily use _________hours  []  chair fits throughout home  []  willing and motivated to use  []  lower seat height required to foot propel  []  short stature   []  High strength lightweight manual  wheelchair (Breezy Ultra 4)  K0004     Arm:    []  both []   right  []  left     Foot:   []  both []  right   []  left                                                                  []  hemi height required []  medical condition and weight of wheelchair affect ability to self propel while engaging in frequent MRADL(s) that cannot be performed in a standard or lightweight manual wheelchair  []  daily use _________hours  []  chair fits throughout home  []  willing and motivated to use  []  prevent repetitive use injuries   []  lower seat height required to foot propel  []  short stature    [x]  Ultra-lightweight manual wheelchair  K0005     Arm:    [x]  both []  right  []  left     Foot:   [x]  both []  right  []  left       []  hemi height required  []  heavy duty    Front seat to floor __17___ inches      Rear seat to floor ___16__ inches      Back height __18___ inches     Back angle ______ degrees      Front angle _____ degrees  [x]   full-time manual wheelchair user  [x]  Requires individualized fitting and optimal adjustments for multiple features that include adjustable axle configuration, fully adjustable center of gravity, wheel camber, seat and back angle, angle of seat slope, which cannot be accommodated by a K0001 through K0004 manual wheelchair  [x]  prevent repetitive use injuries  [x]  daily use_____18+____hours   [x]  user has high activity patterns that frequently require  them  to go out into the community for the purpose of independently accomplishing high level MRADL activities. Examples of these  might include a combination of; shopping, work, school, photographer, childcare, independently loading and unloading from a vehicle etc.  [x]  lower seat height required to foot propel  [x]  short stature  []  heavy duty -  weight over 250lbs   [x]  Current chair is a K0005   manufacture:___________________  model:_________________  serial#____________________  age:_________    []  First time [id number] user (complete trial)  K0004 time and # of strokes to propel 30 feet: ________seconds _________strokes  [id number] time and # of strokes to propel 30 feet: ________seconds _________strokes  What was the result of the trial between the K0004 and K0005 manual wheelchair? ___    What features of the K0005 w/c are needed as compared to the K0004 base? Why?___    [x]  adjustable seat and back angle changes the angle of seat slope of the frame to attain a gravity assisted position for efficient propulsion and proper weight distribution along the frame     [x]  the front of the wheelchair will be configured higher than the back of the chair to allow gravity to assist the user with postural stability  [x]  the center of the wheel will be positioned for stability, safety and efficient propulsion  [x]  adjustable axle allows for vertical, horizontal, camber and overall width changes  throughout the wheels for adjustment of the client's exact needs and abilities.   [x]  adjustable axle increases the stability and function of the chair allowing for adjustment of the center of gravity.   [  x] accommodates the client's anatomical position in the chair maximizing independence in mobility and maneuverability in all environments.   [x]  create a minimal fixed tilt-in space to assist in positioning.   [x]  Describe users full-time manual wheelchair activity patterns:___    []  Power assist Comments:  []  prevent repetitive use injuries  []  repetitive strain injury present in    shoulder girdle    []  shoulder pain is (> or =) to  7/10     during manual propulsion       Current Pain _____/10  []  requires conservation of energy to participate in MRADL(s) runable to propel up ramps or curbs using manual wheelchair  []  been K0005 user greater than one year  []  user unwilling to use power      wheelchair (reason): []  less expensive option to power   wheelchair   []  rim activated power assist -      decreased strength   []  Heavy duty manual wheelchair       K0006     Arm:    []  both []  right  []  left     Foot:   []  both []  right  []  left     []  hemi height required    []  Dependent base  []  user exceeds 250lbs  []  non-functional ambulator    []  extreme spasticity  []  over active movement   []  broken frame/hx of repeated     repairs  []  able to self-propel in residence       []  lower seat to floor height required  []  unable to self-propel in residence   []  Extra heavy duty manual wheelchair  K0007     Arm:    []  both []  right  []  left     Foot:   []  both []  right  []  left     []  hemi height required  []  Dependent base  []  user exceeds 300lbs  []  non-functional ambulator    []  able to self-propel in residence   []  lower seat to floor height required  []  unable to self-propel in residence     []  Manual wheelchair with tilt [phone]      (Manual "Tilt-n-Space")  []  patient is dependent for transfers  []  patient requires frequent       positioning for pressure relief   []  patient requires frequent      positioning for poor/absent trunk control        []  Stroller Base  []  infant/child   []  unable to propel manual      wheelchair  []  allows for growth  []  non-functional ambulator  []  non-functional UE  []  independent mobility is not a goal at this time    MANUAL FRAME OPTIONS      Push handles  []  extended   []  angle adjustable   [x]  standard  [x]  caregiver access  [x]  caregiver assist    []  allows "hooking" to enable      increased ability to perform ADLs or maintain balance   []  Angle Adjustable Back  []   postural control  []  control of tone/spasticity  []  accommodation of range of motion  []  UE functional control  []  accommodation for seating system    Rear wheel placement  []  std/fixed  [x] fully adjustableramputee   [x]  camber _____2___degree  [x]  removable rear wheel  []  non-removable rear wheel  Wheel size _24______  Wheel Style______Mag_________________  [x]  improved UE access to wheels  [x]  increase  propulsion ability  [x]  improved stability  [x]  changing angle in space for      improvement of postural stability  []  remove for transport    [x]  allow for seating system to fit on  base  []  amputee placement  []  1-arm drive access   r R  r L  []  enable propulsion of manual       wheelchair with one arm    []  amputee placement   Wheel rims/ Hand rims  [x]  Standard    []  Specialized-____ [x]  provide ability to propel manual   []  increase self-propulsion with hand wheelchair weakness/decreased grasp     []  Spoke protector/guard   []  prevent hands from getting caught in spokes   Tires:  []  pneumatic  []  flat free inserts  [x]  solid  Style:  [x]  decrease roll resistance              [x]  prevent frequent flats  []  increase shock absorbency  [x]  decrease maintenance   []  decrease pain from road shock    []  decrease spasms from road shock    Wheel Locks:    [x]  push []  pull []  scissor  [x]  lock wheels for transfers  [x]  lock wheels from rolling   Brake/wheel lock extension:  []  R  []  L  []  allow user to operate wheel locks due to decreased reach or strength   Caster housing:  Caster size:          5            Style:                                          []  suspension fork  []  maneuverability   []  stability of wheelchair   []  durability  []  maintenance  []  angle adjustment for posture  []  allow for feet to come under        wheelchair base  []  allows change in seat to floor  height   []  increase shock absorbency  []  decrease pain from road shock  []  decrease  spasms from road    shock   []  Side guards  []  prevent clothing getting caught in wheel or becoming soiled   [] provide hip and pelvic stability  []  eliminates contact between body and wheels  []  limit hand contact with wheels   []  Anti-tippers      []  prevent wheelchair from tipping    backward  []  assist caregiver with curbs     POWER MOBILITY      []  Scooter/POV    []  can safely operate   []  can safely transfer   []  has adequate trunk stability   []  cannot functionally propel  manual wheelchair    []  Power mobility base    []  non-ambulatory   []  cannot functionally propel manual wheelchair   []  cannot functionally and safely      operate scooter/POV  []  can safely operate power       wheelchair  []  home is accessible  []  willing to use power wheelchair     Tilt  []  Powered tilt on powered chair  []  Powered tilt on manual chair  []  Manual tilt on manual chair Comments:  []  change position for pressure      []  elief/cannot weight shift   []  change position against  gravitational force on head and      shoulders   []  decrease pain  []  blood pressure management   []  control autonomic dysreflexia  []  decrease respiratory distress  []  management of spasticity  []  management of low tone  []  facilitate postural control   []  rest periods   []  control edema  []  increase sitting tolerance   []  aid with transfers     Recline   []  Power recline on power chair  []  Manual recline on manual chair  Comments:    []  intermittent catheterization  []  manage spasticity  []  accommodate femur to back angle  []  change position for pressure relief/cannot weight shift rhigh risk of pressure sore development  []  tilt alone does not accomplish     effective pressure relief, maximum pressure relief achieved at -      _______ degrees tilt   _______ degrees recline   []  difficult to transfer to and from bed []  rest periods and sleeping in chair  []  repositioning for transfers  []  bring to  full recline for ADL care  []  clothing/diaper changes in chair  []  gravity PEG tube feeding  []  head positioning  []  decrease pain  []  blood pressure management   []  control autonomic dysreflexia  []  decrease respiratory distress  []  user on ventilator     Elevator on mobility base  []  Power wheelchair  []  Scooter  []  increase Indep in transfers   []  increase Indep in ADLs    []  bathroom function and safety  []  kitchen/cooking function and safety  []  shopping  []  raise height for communication at standing level  []  raise height for eye contact which reduces cervical neck strain and pain  []  drive at raised height for safety and navigating crowds  []  Other:   []  Vertical position system  (anterior tilt)     (Drive locks-out)    []  Stand       (Drive enabled)  []  independent weight bearing  []  decrease joint contractures  []  decrease/manage spasticity  []  decrease/manage spasms  []  pressure distribution away from   scapula, sacrum, coccyx, and ischial tuberosity  []  increase digestion and elimination   []  access to counters and cabinets  []  increase reach  []  increase interaction with others at eye level, reduces neck strain  []  increase performance of       MRADL(s)      Power elevating legrest    []  Center mount (Single) 85-170 degrees       []  Standard (Pair) 100-170 degrees  []  position legs at 90 degrees, not available with std power ELR  []  center mount tucks into chair to decrease turning radius in home, not available with std power ELR  []  provide change in position for LE  []  elevate legs during recline    []  maintain placement of feet on      footplate  []  decrease edema  []  improve circulation  []  actuator needed to elevate legrest  []  actuator needed to articulate legrest preventing knees from flexing  []  Increase ground clearance over      curbs  []   STD (pair) independently                     elevate legrest   POWER WHEELCHAIR CONTROLS       Controls/input device  []  Expandable  []  Non-expandable  []  Proportional  []  Right Hand []  Left Hand  []  Non-proportional/switches/head-array  []   Electrical/proximity         []   Mechanical      Manufacturer:___________________   Type:________________________ []  provides access for controlling wheelchair  []  programming for accurate control  []  progressive disease/changing condition  []  required for alternative drive      controls       []  lacks motor control to operate  proportional drive control  []  unable to understand proportional controls  []  limited movement/strength  []  extraneous movement / tremors / ataxic / spastic       []  Upgraded electronics controller/harness    []  Single power (tilt or recline)   []  Expandable    []  Non-expandable plus   []  Multi-power (tilt, recline, power legrest, power seat lift, vertical positioning system, stand)  []  allows input device to communicate with drive motors  []  harness provides necessary connections between the controller, input device, and seat functions     []  needed in order to operate power seat functions through joystick/ input device  []  required for alternative drive controls     []  Enhanced display  []  required to connect all alternative drive controls   []  required for upgraded joystick      (lite-throw, heavy duty, micro)  []  Allows user to see in which mode and drive the wheelchair is set; necessary for alternate controls       []  Upgraded tracking electronics  []  correct tracking when on uneven surfaces makes switch driving more efficient and less fatiguing  []  increase safety when driving  []  increase ability to traverse thresholds    []  Safety / reset / mode switches     Type:    []  Used to change modes and stop the wheelchair when driving     []  Mount for joystick / input device/switches  []  swing away for access or transfers   []  attaches joystick / input device / switches to wheelchair   []  provides for  consistent access  []  midline for optimal placement    []  Attendant controlled joystick plus     mount  []  safety  []  long distance driving  []  operation of seat functions  []  compliance with transportation regulations    []  Battery  []  required to power (power assist / scooter/ power wc / other):   []  Power inverter (24V to 12V)  []  required for ventilator / respiratory equipment / other:     CHAIR OPTIONS MANUAL & POWER      Armrests   []  adjustable height []  removable  []  swing away []  fixed  []  flip back  []  reclining  []  full length pads []  desk []  tube arms []  gel pads  []  provide support with elbow at 90    []  remove/flip back/swing away for  transfers  []  provide support and positioning of upper body    []  allow to come closer to table top  []  remove for access to tables  []  provide support for w/c tray  []  change of height/angles for variable activities   []  Elbow support / Elbow stop  []  keep elbow positioned on arm pad  []  keep arms from falling off arm pad  during tilt and/or recline   Upper Extremity Support  []  Arm trough  []   R  []   L  Style:  []  swivel mount []  fixed mount   []  posterior hand support  []   tray  []  full tray  []  joystick cut out  []   R  []   L  Style:  []  decrease gravitational pull on      shoulders  []  provide support to increase UE  function  []  provide hand support in natural    position  []  position flaccid UE  []  decrease subluxation    []  decrease edema       []  manage spasticity   []  provide midline positioning  []  provide work surface  []  placement for AAC/ Computer/ EADL       Hangers/ Legrests   []  _80____ degree  []  Elevating []  articulating  [x]  swing away []  fixed []  lift off  []  heavy duty  []  adjustable knee angle  []  adjustable calf panel   []  longer extension tube              [x]  provide LE support  [x]  maintain placement of feet on      footplate   [x]  accommodate lower leg length  []  accommodate to hamstring        tightness  []  enable transfers  []  provide change in position for LE's  []  elevate legs during recline    []  decrease edema  []  durability      Foot support   []  footplate []  R []  L []  flip up           []  Depth adjustable   []  angle adjustable  []  foot board/one piece    []  provide foot support  []  accommodate to ankle ROM  []  allow foot to go under wheelchair base  []  enable transfers     []  Shoe holders  []  position foot    []  decrease / manage spasticity  []  control position of LE  []  stability    []  safety     [x]  Ankle strap/heel      loops  [x]  support foot on foot support  []  decrease extraneous movement  []  provide input to heel   [x]  protect foot     []  Amputee adapter []  R  []  L     Style:                  Size:  []  Provide support for stump/residual extremity    []  Transportation tie-down  []  to provide crash tested tie-down brackets    []  Crutch/cane holder    []  O2 holder    []  IV hanger   []  Ventilator tray/mount    []  stabilize accessory on wheelchair       Component  Justification     [x]  Seat cushion     ROHO High Profile [x]  accommodate impaired sensation  [x]  decubitus ulcers present or history  [x]  unable to shift weight  [x]  increase pressure distribution  [x]  prevent pelvic extension  [x]  custom required "off-the-shelf"    seat cushion will not accommodate deformity  [x]  stabilize/promote pelvis alignment  [x]  stabilize/promote femur alignment  []  accommodate obliquity  []  accommodate multiple deformity  [x]  incontinent/accidents  [x]  low maintenance     []  seat mounts                 []  fixed []  removable  []  attach seat platform/cushion to wheelchair frame    []  Seat wedge    []  provide increased aggressiveness of seat shape to decrease sliding  down in the seat  []  accommodate ROM        []  Cover replacement   []  protect back or seat cushion  []  incontinent/accidents    []   Solid seat / insert    []  support cushion to prevent       hammocking  []  allows attachment of cushion to mobility base    []  Lateral pelvic/thigh/hip     support (Guides)     []  decrease abduction  []  accommodate pelvis  []  position upper legs  []  accommodate spasticity  []  removable for transfers     []  Lateral pelvic/thigh      supports mounts  []  fixed   []  swing-away   []  removable  []  mounts lateral pelvic/thigh supports     []  mounts lateral pelvic/thigh supports swing-away or removable for transfers    []  Medial thigh support (Pommel)  [] decrease adduction  [] accommodate ROM  []  remove for transfers   []  alignment      []  Medial thigh   []  fixed      support mounts      []  swing-away   []  removable  []  mounts medial thigh supports   []  Mounts medial supports swing- away or removable for transfers       Component  Justification   [x]  Back   Standard     [x]  provide posterior trunk support []  facilitate tone  []  provide lumbar/sacral support []  accommodate deformity  []  support trunk in midline   []  custom required "off-the-shelf" back support will not accommodate deformity   []  provide lateral trunk support []  accommodate or decrease tone            []  Back mounts  []  fixed  []  removable  []  attach back rest/cushion to wheelchair frame   []  Lateral trunk      supports  []  R []  L  []  decrease lateral trunk leaning  []  accommodate asymmetry    []  contour for increased contact  []  safety    []  control of tone    []  Lateral trunk      supports mounts  []  fixed  []  swing-away   []  removable  []  mounts lateral trunk supports     []  Mounts lateral trunk supports swing-away or removable for transfers   []  Anterior chest      strap, vest     []  decrease forward movement of shoulder  []  decrease forward movement of trunk  []  safety/stability  []  added abdominal support  []  trunk alignment  []  assistance with shoulder control   []  decrease shoulder elevation    []  Headrest      []  provide posterior head support  []  provide  posterior neck support  []  provide lateral head support  []  provide anterior head support  []  support during tilt and recline  []  improve feeding     []  improve respiration  []  placement of switches  []  safety    []  accommodate ROM   []  accommodate tone  []  improve visual orientation   []  Headrest           []  fixed []  removable []  flip down      Mounting hardware   []  swing-away laterals/switches  []  mount headrest   []  mounts headrest flip down or  removable for transfers  []  mount headrest swing-away laterals   []  mount switches     []  Neck Support    []  decrease neck rotation  []  decrease forward neck flexion   Pelvic Positioner    []  std hip belt          []  padded hip belt  []  dual pull hip  belt  []  four point hip belt  []  stabilize tone  []  decrease falling out of chair  []  prevent excessive extension  []  special pull angle to control      rotation  []  pad for protection over boney   prominence  []  promote comfort    []  Essential needs        bag/pouch   []  medicines []  special food rorthotics []  clothing changes  []  diapers  []  catheter/hygiene []  ostomy supplies   The above equipment has a life- long use expectancy.  Growth and changes in medical and/or functional conditions would be the exceptions.   SUMMARY:    ASSESSMENT:  CLINICAL IMPRESSION: The patient is a 53 year old male with a significant past medical history including spina bifida, recurrent decubitus ulcers, septic arthritis of bilateral hips, chronic osteomyelitis, DVT, UTI, and right rotator cuff tear. He was recently hospitalized for an unstageable sacral ulcer with sepsis and is currently residing in a skilled nursing facility receiving wound care for an active sacral pressure ulcer. The patient demonstrates decreased right shoulder active range of motion and strength with associated pain, limiting his ability to propel a standard wheelchair effectively. He is currently using a donated wheelchair that  does not meet medical necessity; the chair is oversized and the existing cushion is inadequate, contributing to the development of his current wound. Due to his complex medical condition, history of pressure injuries, and current active ulcer, the patient requires a lightweight K5 manual wheelchair to allow independent mobility with reduced strain on his upper extremities and to accommodate his physical limitations. Additionally, a ROHO high-profile cushion is medically necessary to provide optimal pressure redistribution and skin protection to prevent further breakdown and promote healing of the existing ulcer. These equipment recommendations are essential to meet the patient's functional and medical needs, reduce risk of complications, and improve overall quality of life.   OBJECTIVE IMPAIRMENTS Abnormal gait, decreased activity tolerance, decreased balance, decreased endurance, decreased mobility, difficulty walking, decreased ROM, decreased strength, hypomobility, increased edema, increased muscle spasms, impaired flexibility, impaired sensation, impaired tone, impaired UE functional use, improper body mechanics, postural dysfunction, and pain.   ACTIVITY LIMITATIONS carrying, lifting, bending, standing, squatting, stairs, transfers, bed mobility, continence, bathing, toileting, dressing, reach over head, and hygiene/grooming  PARTICIPATION LIMITATIONS: meal prep, cleaning, laundry, driving, shopping, and community activity  PERSONAL FACTORS Age and 3+ comorbidities: SCI, pressure ulcer, hx of back and hip surgeries are also affecting patient's functional outcome.   REHAB POTENTIAL: Good  CLINICAL DECISION MAKING: Evolving/moderate complexity  EVALUATION COMPLEXITY: High                                   GOALS: One time visit. No goals established.    PLAN: PT FREQUENCY: one time visit    Anthony [name], PT 12/25/2023, 10:43 AM    I concur with the above findings and  recommendations of the therapist:  Physician name printed:         Physician's signature:      Date:

## 2024-01-22 IMAGING — CR DG CHEST 2V
2 series · 2 of 2 positions shown · non-contrast
Comparison: 11/22/2019

CLINICAL DATA: Sepsis

EXAM:
CHEST - 2 VIEW

[chest ap]
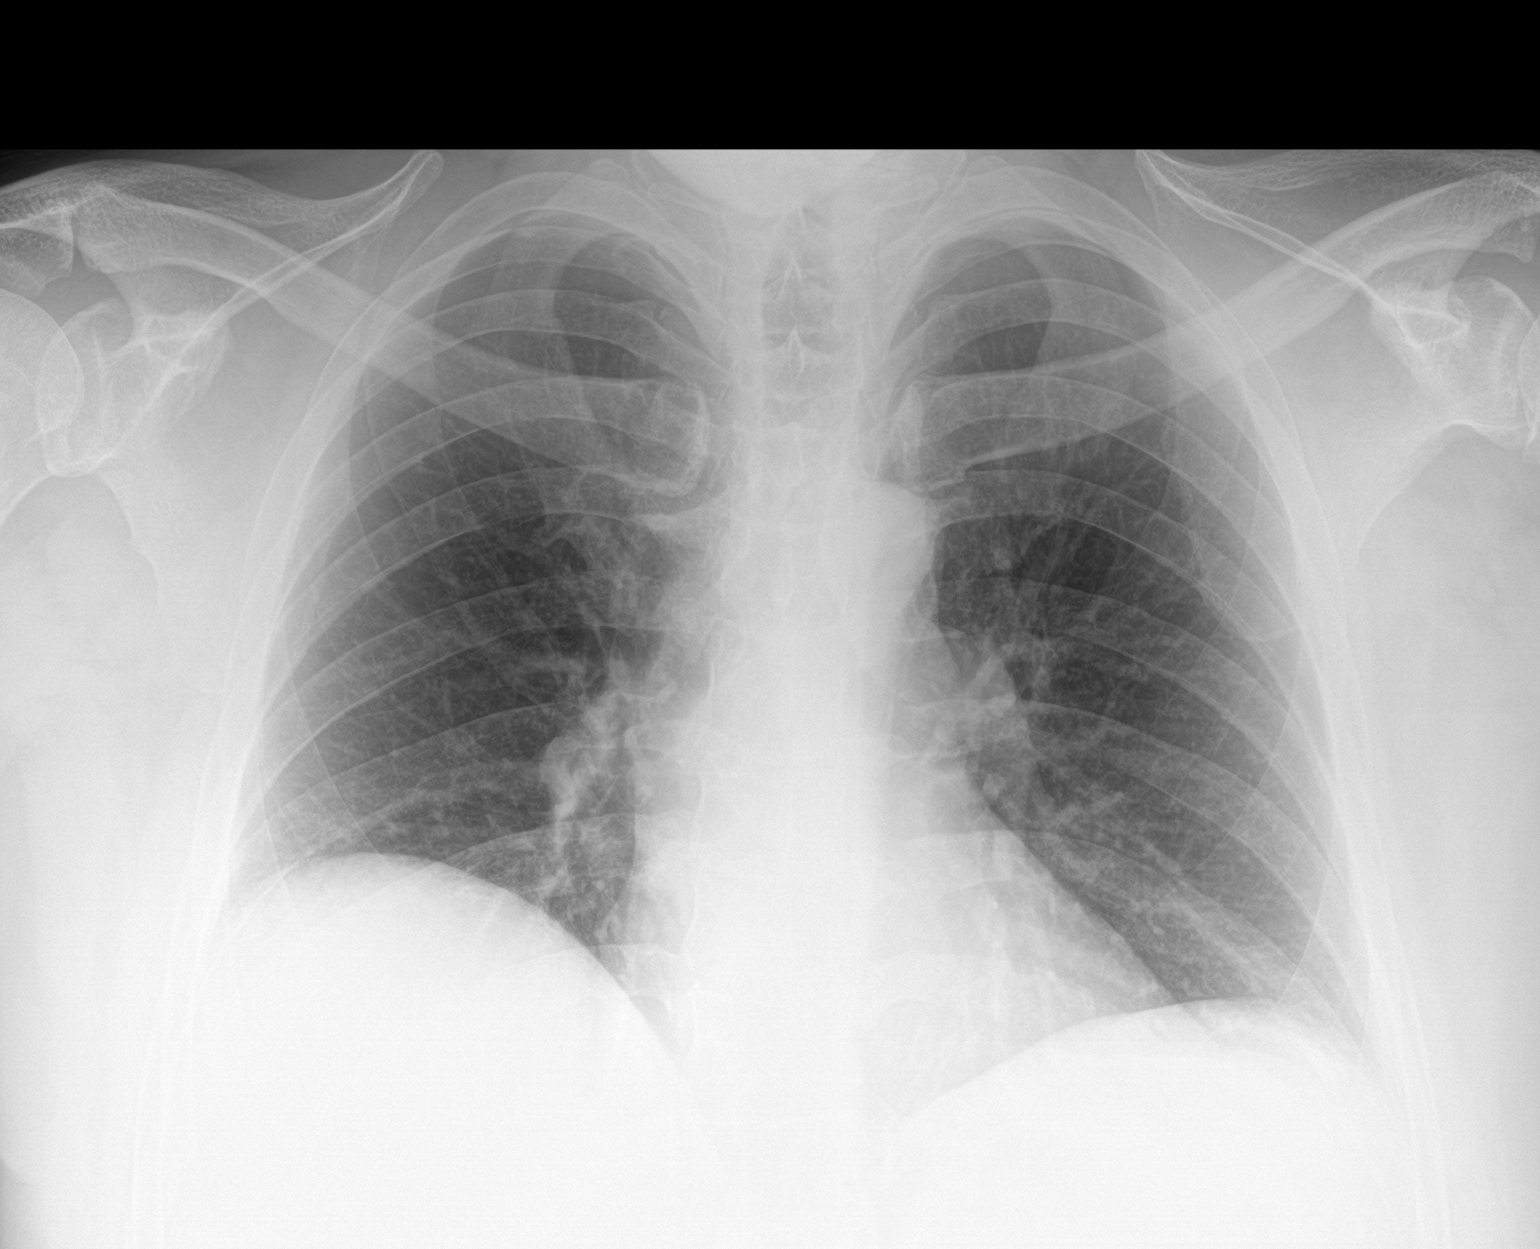

[chest lat]
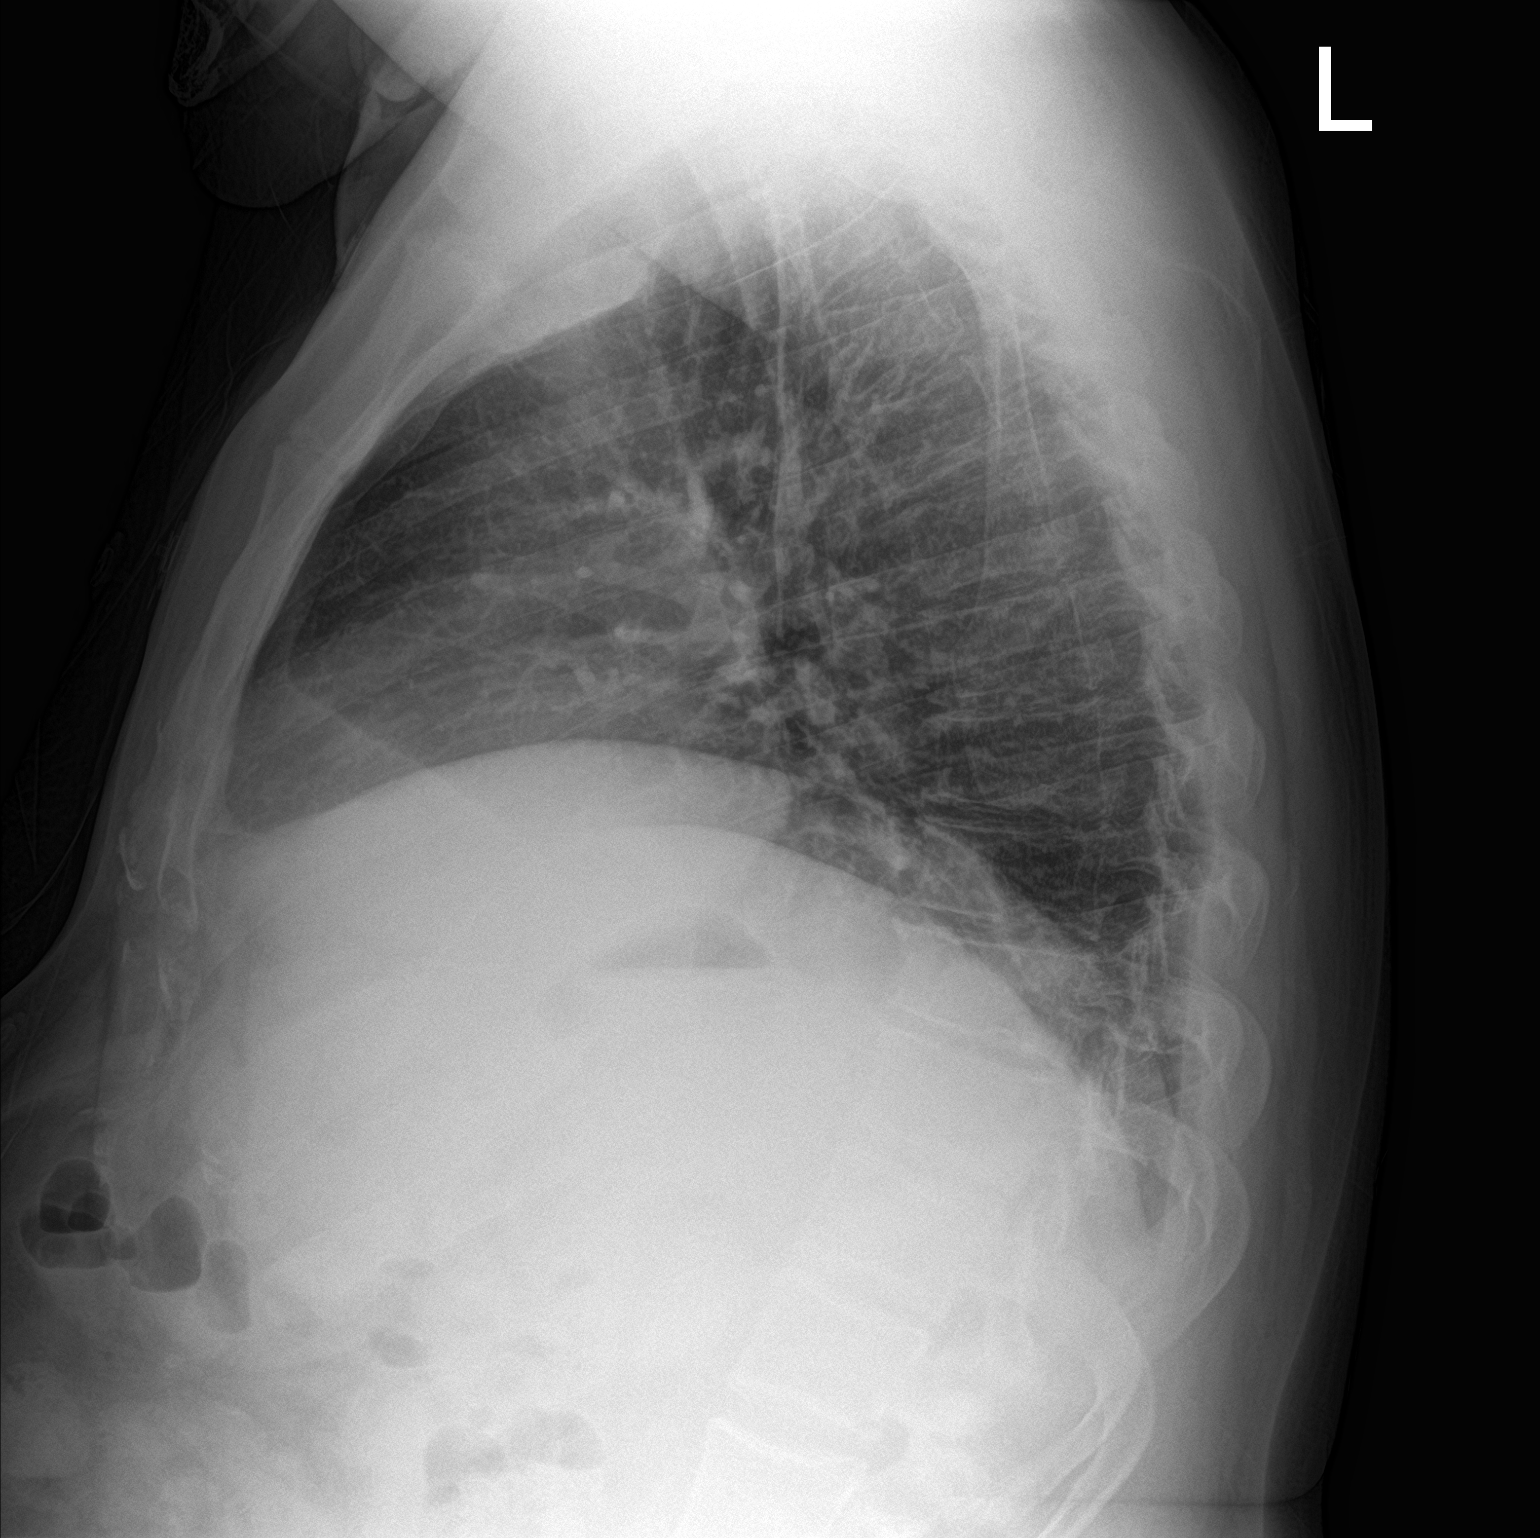

[2 of 2 positions shown; findings below may reference images not displayed]

FINDINGS: The heart size and mediastinal contours are within normal limits.
Both lungs are clear. Disc degenerative disease of the thoracic
spine.
IMPRESSION: No acute abnormality of the lungs.

## 2024-01-22 IMAGING — CT CT PELVIS W/ CM
2 of 3 series · 15 of 46 positions shown, 17 images · IV contrast (Omni 300)
Comparison: 06/23/2017.

CLINICAL DATA: Evaluate for perianal abscess or fistula.

EXAM:
CT PELVIS WITH CONTRAST
TECHNIQUE: Multidetector CT imaging of the pelvis was performed using the
standard protocol following the bolus administration of intravenous
contrast.

[Series 3: pelvis with 5.0 · axial · 0.90mm/px · z∈[+915,+1170]mm · 12 of 59 slices shown, 14 images]
[im 4/59  soft-tissue]
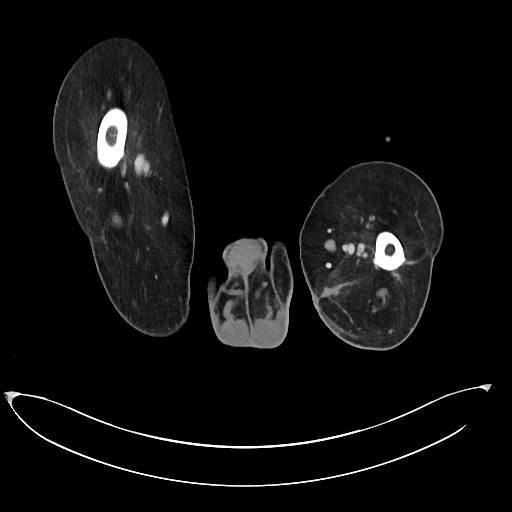
[im 4/59  bone]
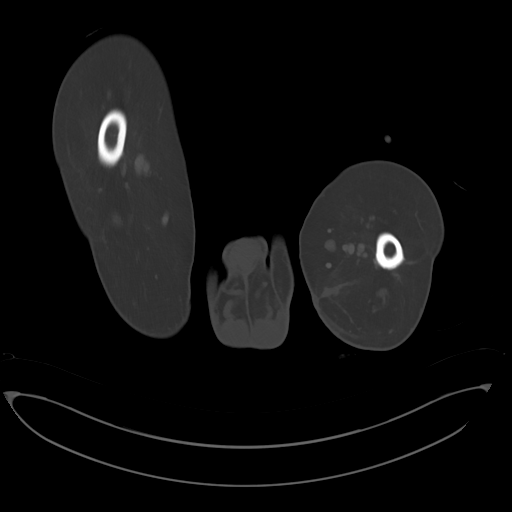
[im 8/59  soft-tissue]
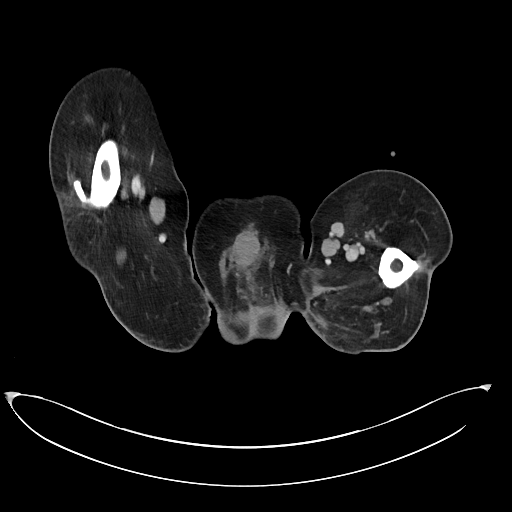
[im 14/59  soft-tissue]
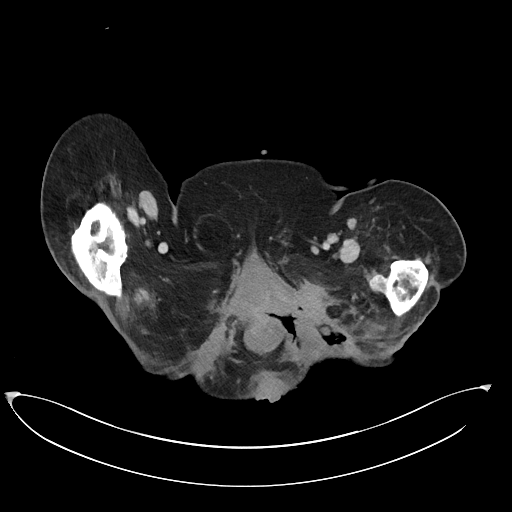
[im 17/59  soft-tissue]
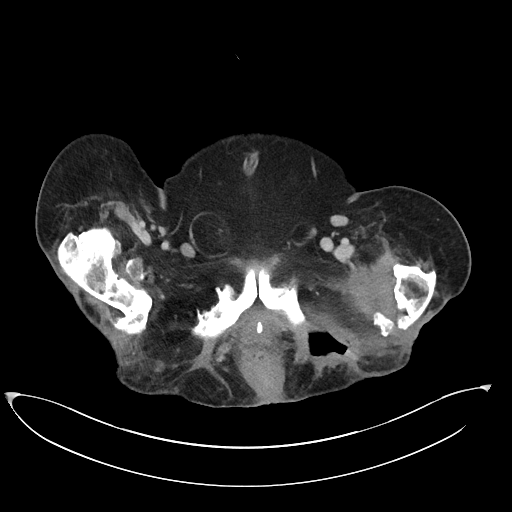
[im 23/59  soft-tissue]
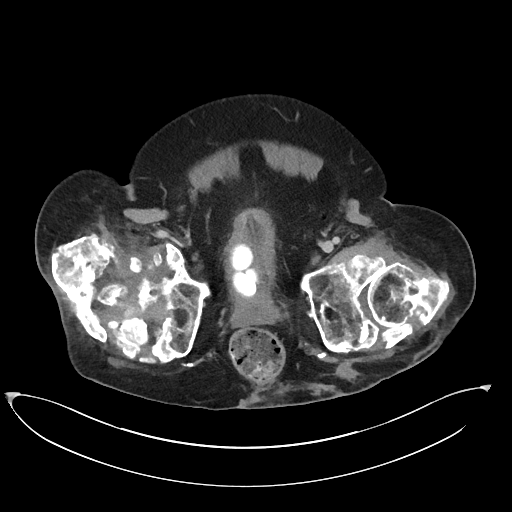
[im 27/59  soft-tissue]
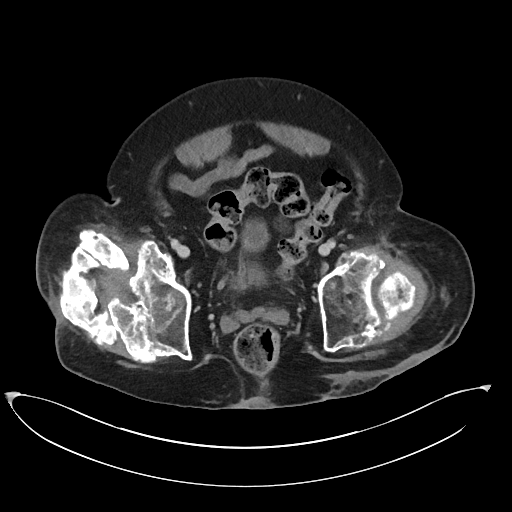
[im 32/59  soft-tissue]
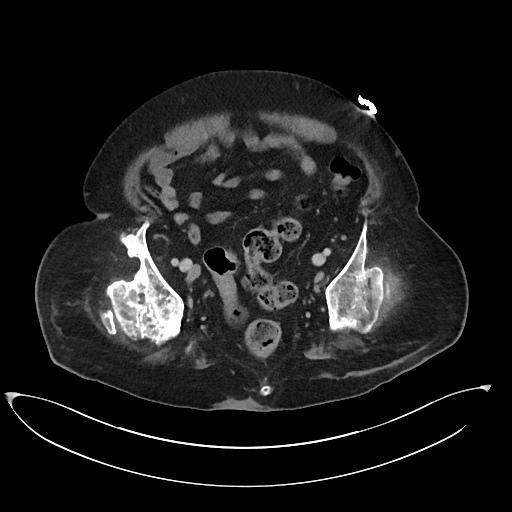
[im 36/59  soft-tissue]
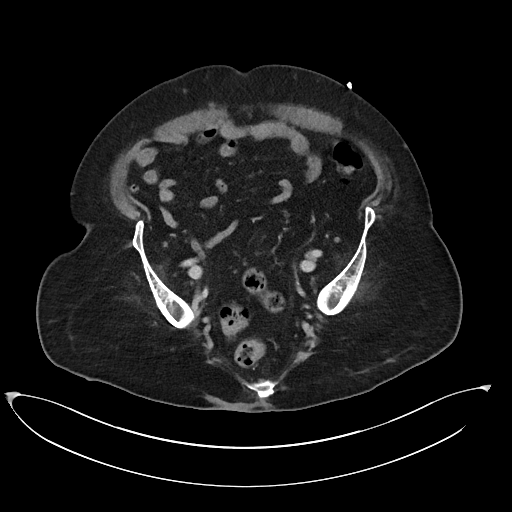
[im 42/59  soft-tissue]
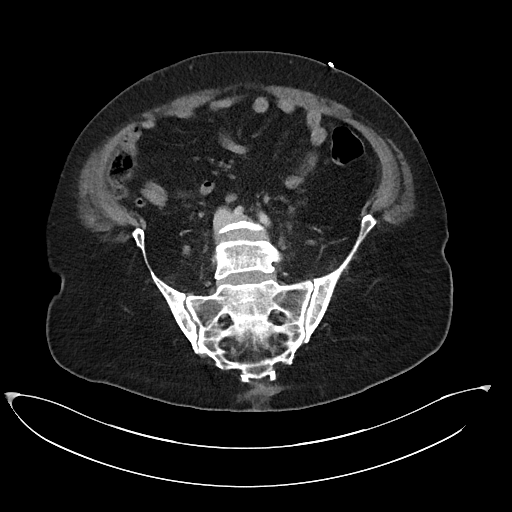
[im 42/59  bone]
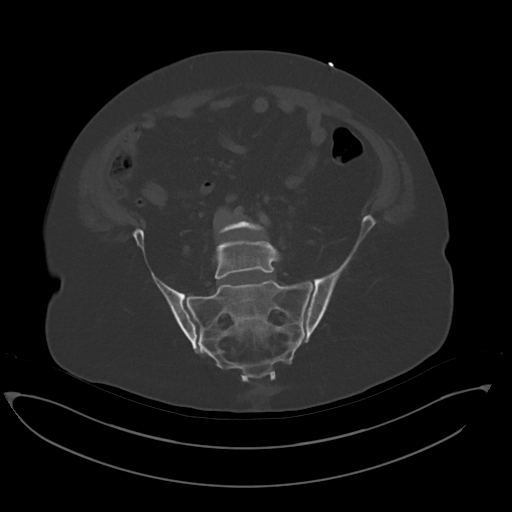
[im 45/59  soft-tissue]
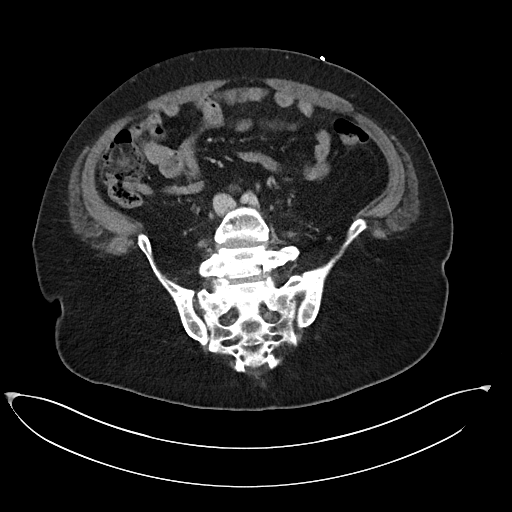
[im 51/59  soft-tissue]
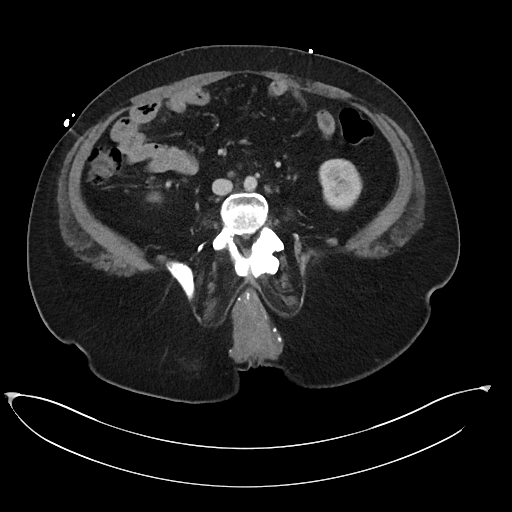
[im 55/59  soft-tissue]
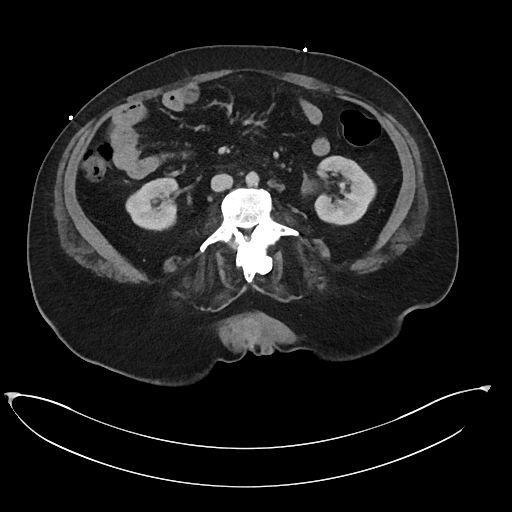

[Series 5: pelvis with 2.0 cor · coronal · 0.61mm/px · 3 of 172 slices shown]
[im 58/172  soft-tissue]
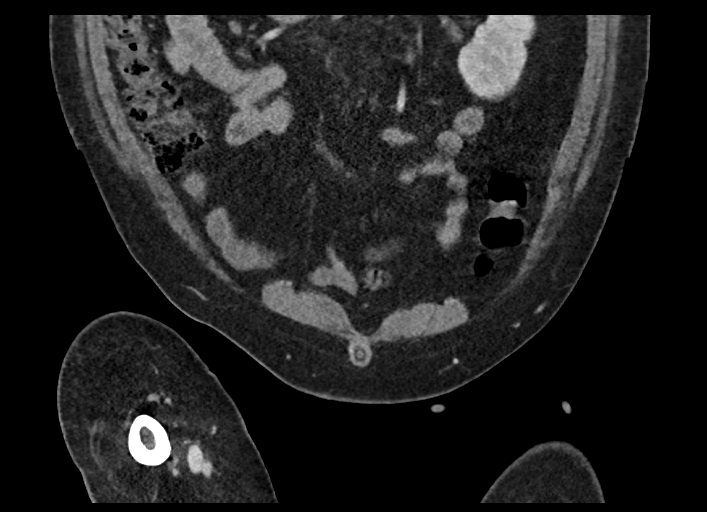
[im 77/172  soft-tissue]
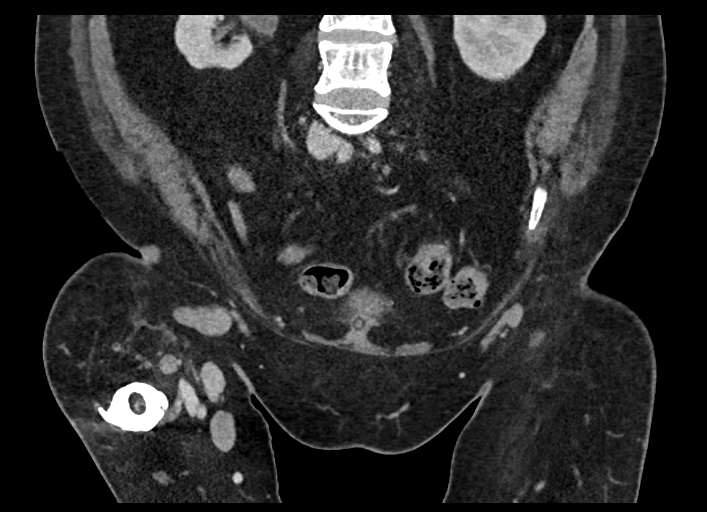
[im 96/172  soft-tissue]
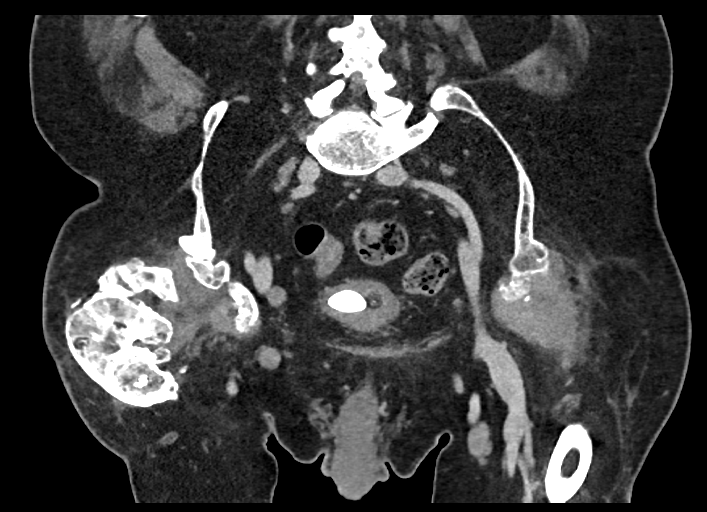

[15 of 46 positions shown; findings below may reference images not displayed]

RADIATION DOSE REDUCTION: This exam was performed according to the
departmental dose-optimization program which includes automated
exposure control, adjustment of the mA and/or kV according to
patient size and/or use of iterative reconstruction technique.

CONTRAST:  100mL OMNIPAQUE IOHEXOL 300 MG/ML  SOLN
FINDINGS: Urinary Tract: The urinary bladder is collapsed around a Foley
catheter balloon. There are 3 large stones within the decompressed
bladder measuring up to 2.6 cm.

Bowel: The visualized bowel loops are nondilated. No wall thickening
or inflammation identified.

Vascular/Lymphatic: Aortic atherosclerosis. No aneurysm identified.
Prominent bilateral retroperitoneal and pelvic lymph nodes are
identified. Index aortocaval node measures 1 cm, image [DATE]. Right
external iliac lymph node measures 1.2 cm, image 36/3. Borderline
enlarged bilateral inguinal lymph nodes are also noted including
right inguinal node measuring 1.3 cm, image 51/3.

Reproductive:  No mass or other significant abnormality

Other: Large area of soft tissue ulceration is identified
superficial to the posterior left acetabulum and extending along the
left ischium and left inferior pubic rami. The soft tissue
ulceration extends into the base of penis and appears unchanged when
compared with 01/23/2017. Previously noted right ischial soft tissue
ulceration has healed since the previous exam. No discrete fluid
collection identified to suggest drainable abscess.

Musculoskeletal: Signs of chronic osteomyelitis is noted involving
bilateral inferior pubic rami, unchanged from previous imaging.
Chronic bilateral hip dysplasia with extensive heterotopic bone
formation is identified. Signs of prior right hip Andika Budi
procedure noted.
IMPRESSION: 1. Large area of chronic, decubitus soft tissue ulceration
superficial to the posterior left acetabulum and extending along the
left ischium and left inferior pubic rami. The soft tissue
ulceration extends into the base of penis and appears unchanged when
compared with previous imaging. Within the limitations of unenhanced
technique, no discrete fluid collection identified to suggest
drainable abscess.
2. Signs of chronic osteomyelitis involving bilateral inferior pubic
rami.
3. Chronic bilateral hip dysplasia with extensive heterotopic bone
formation.
4. Multiple large bladder stones.
5. Borderline enlarged bilateral retroperitoneal and pelvic lymph
nodes, favor reactive adenopathy.
6. Aortic Atherosclerosis ([id number]).
